# Patient Record
Sex: Female | Born: 1970 | Race: Black or African American | Hispanic: No | Marital: Single | Smoking: Former smoker
Health system: Southern US, Community
[De-identification: ages and names within clinical notes are randomized; demographics above are authoritative.]

## PROBLEM LIST (undated history)

## (undated) DIAGNOSIS — Z973 Presence of spectacles and contact lenses: Secondary | ICD-10-CM

## (undated) DIAGNOSIS — D649 Anemia, unspecified: Secondary | ICD-10-CM

## (undated) DIAGNOSIS — J189 Pneumonia, unspecified organism: Secondary | ICD-10-CM

## (undated) DIAGNOSIS — I2510 Atherosclerotic heart disease of native coronary artery without angina pectoris: Secondary | ICD-10-CM

## (undated) DIAGNOSIS — C50511 Malignant neoplasm of lower-outer quadrant of right female breast: Secondary | ICD-10-CM

## (undated) DIAGNOSIS — R51 Headache: Secondary | ICD-10-CM

## (undated) DIAGNOSIS — I10 Essential (primary) hypertension: Secondary | ICD-10-CM

## (undated) DIAGNOSIS — Z1379 Encounter for other screening for genetic and chromosomal anomalies: Principal | ICD-10-CM

## (undated) DIAGNOSIS — F419 Anxiety disorder, unspecified: Secondary | ICD-10-CM

## (undated) DIAGNOSIS — C50911 Malignant neoplasm of unspecified site of right female breast: Secondary | ICD-10-CM

## (undated) DIAGNOSIS — E785 Hyperlipidemia, unspecified: Secondary | ICD-10-CM

## (undated) DIAGNOSIS — I421 Obstructive hypertrophic cardiomyopathy: Secondary | ICD-10-CM

## (undated) DIAGNOSIS — N182 Chronic kidney disease, stage 2 (mild): Secondary | ICD-10-CM

## (undated) DIAGNOSIS — I499 Cardiac arrhythmia, unspecified: Secondary | ICD-10-CM

## (undated) DIAGNOSIS — Z170 Estrogen receptor positive status [ER+]: Secondary | ICD-10-CM

## (undated) DIAGNOSIS — Z923 Personal history of irradiation: Secondary | ICD-10-CM

## (undated) DIAGNOSIS — I219 Acute myocardial infarction, unspecified: Secondary | ICD-10-CM

## (undated) DIAGNOSIS — F329 Major depressive disorder, single episode, unspecified: Secondary | ICD-10-CM

## (undated) DIAGNOSIS — Z9189 Other specified personal risk factors, not elsewhere classified: Secondary | ICD-10-CM

## (undated) DIAGNOSIS — G373 Acute transverse myelitis in demyelinating disease of central nervous system: Secondary | ICD-10-CM

## (undated) DIAGNOSIS — F319 Bipolar disorder, unspecified: Secondary | ICD-10-CM

## (undated) DIAGNOSIS — Z955 Presence of coronary angioplasty implant and graft: Secondary | ICD-10-CM

## (undated) DIAGNOSIS — R519 Headache, unspecified: Secondary | ICD-10-CM

## (undated) DIAGNOSIS — R002 Palpitations: Secondary | ICD-10-CM

## (undated) DIAGNOSIS — F3170 Bipolar disorder, currently in remission, most recent episode unspecified: Secondary | ICD-10-CM

## (undated) DIAGNOSIS — K219 Gastro-esophageal reflux disease without esophagitis: Secondary | ICD-10-CM

## (undated) DIAGNOSIS — I252 Old myocardial infarction: Secondary | ICD-10-CM

## (undated) DIAGNOSIS — G43909 Migraine, unspecified, not intractable, without status migrainosus: Secondary | ICD-10-CM

## (undated) DIAGNOSIS — F32A Depression, unspecified: Secondary | ICD-10-CM

## (undated) DIAGNOSIS — N189 Chronic kidney disease, unspecified: Secondary | ICD-10-CM

## (undated) DIAGNOSIS — N183 Chronic kidney disease, stage 3 unspecified: Secondary | ICD-10-CM

## (undated) HISTORY — DX: Other specified personal risk factors, not elsewhere classified: Z91.89

## (undated) HISTORY — PX: UMBILICAL HERNIA REPAIR: SHX196

## (undated) HISTORY — PX: HERNIA REPAIR: SHX51

## (undated) HISTORY — DX: Chronic kidney disease, unspecified: N18.9

## (undated) HISTORY — PX: ABDOMINAL HERNIA REPAIR: SHX539

## (undated) HISTORY — DX: Atherosclerotic heart disease of native coronary artery without angina pectoris: I25.10

## (undated) HISTORY — DX: Essential (primary) hypertension: I10

## (undated) HISTORY — DX: Palpitations: R00.2

## (undated) HISTORY — PX: CORONARY ANGIOPLASTY WITH STENT PLACEMENT: SHX49

## (undated) HISTORY — DX: Acute myocardial infarction, unspecified: I21.9

## (undated) HISTORY — DX: Hyperlipidemia, unspecified: E78.5

## (undated) HISTORY — DX: Encounter for other screening for genetic and chromosomal anomalies: Z13.79

## (undated) HISTORY — DX: Depression, unspecified: F32.A

## (undated) HISTORY — DX: Anxiety disorder, unspecified: F41.9

## (undated) HISTORY — DX: Acute transverse myelitis in demyelinating disease of central nervous system: G37.3

## (undated) HISTORY — DX: Obstructive hypertrophic cardiomyopathy: I42.1

## (undated) HISTORY — DX: Major depressive disorder, single episode, unspecified: F32.9

## (undated) HISTORY — PX: BREAST BIOPSY: SHX20

---

## 2003-10-10 HISTORY — PX: TONSILLECTOMY: SUR1361

## 2014-09-07 ENCOUNTER — Ambulatory Visit (INDEPENDENT_AMBULATORY_CARE_PROVIDER_SITE_OTHER): Payer: Federal, State, Local not specified - PPO | Admitting: Family Medicine

## 2014-09-07 VITALS — BP 180/100 | HR 90 | Temp 98.0°F | Resp 16 | Ht 61.5 in | Wt 146.8 lb

## 2014-09-07 DIAGNOSIS — Z131 Encounter for screening for diabetes mellitus: Secondary | ICD-10-CM

## 2014-09-07 DIAGNOSIS — I10 Essential (primary) hypertension: Secondary | ICD-10-CM

## 2014-09-07 DIAGNOSIS — N898 Other specified noninflammatory disorders of vagina: Secondary | ICD-10-CM

## 2014-09-07 DIAGNOSIS — F17200 Nicotine dependence, unspecified, uncomplicated: Secondary | ICD-10-CM

## 2014-09-07 DIAGNOSIS — R350 Frequency of micturition: Secondary | ICD-10-CM

## 2014-09-07 DIAGNOSIS — E785 Hyperlipidemia, unspecified: Secondary | ICD-10-CM

## 2014-09-07 DIAGNOSIS — N182 Chronic kidney disease, stage 2 (mild): Secondary | ICD-10-CM

## 2014-09-07 DIAGNOSIS — D72829 Elevated white blood cell count, unspecified: Secondary | ICD-10-CM

## 2014-09-07 DIAGNOSIS — R0683 Snoring: Secondary | ICD-10-CM

## 2014-09-07 DIAGNOSIS — Z130 Encounter for screening for diseases of the blood and blood-forming organs and certain disorders involving the immune mechanism: Secondary | ICD-10-CM

## 2014-09-07 LAB — POCT WET PREP WITH KOH
Clue Cells Wet Prep HPF POC: NEGATIVE
KOH Prep POC: NEGATIVE
RBC Wet Prep HPF POC: NEGATIVE
Trichomonas, UA: NEGATIVE
WBC Wet Prep HPF POC: NEGATIVE
Yeast Wet Prep HPF POC: NEGATIVE

## 2014-09-07 LAB — POCT URINALYSIS DIPSTICK
Bilirubin, UA: NEGATIVE
Blood, UA: NEGATIVE
Glucose, UA: NEGATIVE
Ketones, UA: NEGATIVE
Leukocytes, UA: NEGATIVE
Nitrite, UA: NEGATIVE
Protein, UA: NEGATIVE
Spec Grav, UA: 1.02
Urobilinogen, UA: 0.2
pH, UA: 6.5

## 2014-09-07 LAB — COMPLETE METABOLIC PANEL WITH GFR
ALT: 12 U/L (ref 0–35)
AST: 18 U/L (ref 0–37)
Albumin: 4.2 g/dL (ref 3.5–5.2)
Alkaline Phosphatase: 63 U/L (ref 39–117)
BUN: 9 mg/dL (ref 6–23)
CO2: 22 mEq/L (ref 19–32)
Calcium: 9.5 mg/dL (ref 8.4–10.5)
Chloride: 105 mEq/L (ref 96–112)
Creat: 1.03 mg/dL (ref 0.50–1.10)
GFR, Est African American: 77 mL/min
GFR, Est Non African American: 67 mL/min
Glucose, Bld: 92 mg/dL (ref 70–99)
Potassium: 4.1 mEq/L (ref 3.5–5.3)
Sodium: 137 mEq/L (ref 135–145)
Total Bilirubin: 0.5 mg/dL (ref 0.2–1.2)
Total Protein: 7.1 g/dL (ref 6.0–8.3)

## 2014-09-07 LAB — POCT CBC
Granulocyte percent: 72.4 %G (ref 37–80)
HCT, POC: 38.3 % (ref 37.7–47.9)
Hemoglobin: 12.6 g/dL (ref 12.2–16.2)
Lymph, poc: 3.5 — AB (ref 0.6–3.4)
MCH, POC: 30.4 pg (ref 27–31.2)
MCHC: 33 g/dL (ref 31.8–35.4)
MCV: 91.9 fL (ref 80–97)
MID (cbc): 0.5 (ref 0–0.9)
MPV: 7.2 fL (ref 0–99.8)
POC Granulocyte: 10.5 — AB (ref 2–6.9)
POC LYMPH PERCENT: 24.1 %L (ref 10–50)
POC MID %: 3.5 %M (ref 0–12)
Platelet Count, POC: 351 10*3/uL (ref 142–424)
RBC: 4.17 M/uL (ref 4.04–5.48)
RDW, POC: 15 %
WBC: 14.5 10*3/uL — AB (ref 4.6–10.2)

## 2014-09-07 LAB — LIPID PANEL
Cholesterol: 260 mg/dL — ABNORMAL HIGH (ref 0–200)
HDL: 40 mg/dL (ref >39)
LDL Cholesterol: 199 mg/dL — ABNORMAL HIGH (ref 0–99)
Total CHOL/HDL Ratio: 6.5 Ratio
Triglycerides: 105 mg/dL (ref <150)
VLDL: 21 mg/dL (ref 0–40)

## 2014-09-07 LAB — TSH: TSH: 0.896 u[IU]/mL (ref 0.350–4.500)

## 2014-09-07 LAB — POCT UA - MICROSCOPIC ONLY
Bacteria, U Microscopic: NEGATIVE
Casts, Ur, LPF, POC: NEGATIVE
Crystals, Ur, HPF, POC: NEGATIVE
Mucus, UA: NEGATIVE
RBC, urine, microscopic: NEGATIVE
WBC, Ur, HPF, POC: NEGATIVE
Yeast, UA: NEGATIVE

## 2014-09-07 LAB — POCT GLYCOSYLATED HEMOGLOBIN (HGB A1C): Hemoglobin A1C: 5.2

## 2014-09-07 MED ORDER — TRIAMTERENE-HCTZ 37.5-25 MG PO CAPS: 1.0000 | ORAL_CAPSULE | Freq: Every day | ORAL | Status: DC

## 2014-09-07 MED ORDER — HYDRALAZINE HCL 25 MG PO TABS: 25.0000 mg | ORAL_TABLET | Freq: Three times a day (TID) | ORAL | Status: DC

## 2014-09-07 MED ORDER — DILTIAZEM HCL ER 240 MG PO CP24: 240.0000 mg | ORAL_CAPSULE | Freq: Every day | ORAL | Status: DC

## 2014-09-07 NOTE — Progress Notes (Signed)
Chief Complaint:  Chief Complaint  Patient presents with  . Medication Refill    all on list     HPI: Anna Henson is a 43 y.o. female who is here for :  1. HTN which is poorly controlled, she moved here from [facility] , 2 months ago. She works for the [employer] in [employer].  She has family here. She has been out of her HTN meds for several months except for the hydralazine. She is on 4 blood pressure medicinea for the last 5 years maybe even 10 years. She is a smoker 1/2 ppd x 25 years. Does not drink regular, just a glass of wine.  BP 183/105 this morning. She is havign a HA but no CP, diaphoresis, no palpitations, no SOB. NO vision changes. Denies DM, Has hyperlipidemia.   2. She  Also states that she snores, she feels tired all the time, she is tired even with 8 hours of sleep, she has had depression in the past ( zoloft 1/2 tab daily), seh doe snot take naps but would do it if she could at work. No pedal edema and no abnormal weight gain. She has some right hand swelling.    Past Medical History  Diagnosis Date  . Hypertension     > 5 years  . Chronic kidney disease     she sthinks has stage 2 CKD, has had US doppler and has some stenosis , this was done in [place]   . Hyperlipidemia    Past Surgical History  Procedure Laterality Date  . Hernia repair     History   Social History  . Marital Status: Single    Spouse Name: N/A    Number of Children: N/A  . Years of Education: N/A   Social History Main Topics  . Smoking status: Current Every Day Smoker -- 0.50 packs/day    Types: Cigarettes  . Smokeless tobacco: None  . Alcohol Use: None  . Drug Use: None  . Sexual Activity: None   Other Topics Concern  . None   Social History Narrative   Family History  Problem Relation Age of Onset  . Diabetes Mother   . Heart disease Mother   . Hyperlipidemia Mother   . Hypertension Mother    No Known Allergies Prior to Admission medications   Medication Sig  Start Date End Date Taking? Authorizing Provider  diltiazem (DILACOR XR) 240 MG 24 hr capsule Take 240 mg by mouth daily.   Yes Historical Provider, MD  hydrALAZINE (APRESOLINE) 25 MG tablet Take 25 mg by mouth 3 (three) times daily.   Yes Historical Provider, MD  triamterene-hydrochlorothiazide (DYAZIDE) 37.5-25 MG per capsule Take 1 capsule by mouth daily.   Yes Historical Provider, MD     ROS: The patient denies fevers, chills, night sweats, unintentional weight loss, chest pain, palpitations, wheezing, dyspnea on exertion, nausea, vomiting, abdominal pain, dysuria, hematuria, melena, numbness, weakness, or tingling.   All other systems have been reviewed and were otherwise negative with the exception of those mentioned in the HPI and as above.    PHYSICAL EXAM: Filed Vitals:   09/07/14 1135  BP: 180/100  Pulse:   Temp:   Resp:    Filed Vitals:   09/07/14 1107  Height: 5' 1.5" (1.562 m)  Weight: 146 lb 12.8 oz (66.588 kg)   Body mass index is 27.29 kg/(m^2).  General: Alert, no acute distress HEENT:  Normocephalic, atraumatic, oropharynx patent. EOMI,  PERRLA, fundo exam normal, TM nl Cardiovascular:  Regular rate and rhythm, no rubs murmurs or gallops.  No Carotid bruits, radial pulse intact. No pedal edema.  Respiratory: Clear to auscultation bilaterally.  No wheezes, rales, or rhonchi.  No cyanosis, no use of accessory musculature GI: No organomegaly, abdomen is soft and non-tender, positive bowel sounds.  No masses. Skin: No rashes. Neurologic: Facial musculature symmetric. CN 2-12 grossly normal Psychiatric: Patient is appropriate throughout our interaction. Lymphatic: No cervical lymphadenopathy Musculoskeletal: Gait intact. Neg for meningeal signs She is a smoker, has chronic smokers cough NO URI sxs at thsi time except for dry cough    LABS: Results for orders placed or performed in visit on 09/07/14  COMPLETE METABOLIC PANEL WITH GFR  Result Value Ref Range    Sodium 137 135 - 145 mEq/L   Potassium 4.1 3.5 - 5.3 mEq/L   Chloride 105 96 - 112 mEq/L   CO2 22 19 - 32 mEq/L   Glucose, Bld 92 70 - 99 mg/dL   BUN 9 6 - 23 mg/dL   Creat 1.03 0.50 - 1.10 mg/dL   Total Bilirubin 0.5 0.2 - 1.2 mg/dL   Alkaline Phosphatase 63 39 - 117 U/L   AST 18 0 - 37 U/L   ALT 12 0 - 35 U/L   Total Protein 7.1 6.0 - 8.3 g/dL   Albumin 4.2 3.5 - 5.2 g/dL   Calcium 9.5 8.4 - 10.5 mg/dL   GFR, Est African American 77 mL/min   GFR, Est Non African American 67 mL/min  TSH  Result Value Ref Range   TSH 0.896 0.350 - 4.500 uIU/mL  Lipid panel  Result Value Ref Range   Cholesterol 260 (H) 0 - 200 mg/dL   Triglycerides 105 <150 mg/dL   HDL 40 >39 mg/dL   Total CHOL/HDL Ratio 6.5 Ratio   VLDL 21 0 - 40 mg/dL   LDL Cholesterol 199 (H) 0 - 99 mg/dL  Microalbumin, urine  Result Value Ref Range   Microalb, Ur 0.8 <2.0 mg/dL  POCT CBC  Result Value Ref Range   WBC 14.5 (A) 4.6 - 10.2 K/uL   Lymph, poc 3.5 (A) 0.6 - 3.4   POC LYMPH PERCENT 24.1 10 - 50 %L   MID (cbc) 0.5 0 - 0.9   POC MID % 3.5 0 - 12 %M   POC Granulocyte 10.5 (A) 2 - 6.9   Granulocyte percent 72.4 37 - 80 %G   RBC 4.17 4.04 - 5.48 M/uL   Hemoglobin 12.6 12.2 - 16.2 g/dL   HCT, POC 38.3 37.7 - 47.9 %   MCV 91.9 80 - 97 fL   MCH, POC 30.4 27 - 31.2 pg   MCHC 33.0 31.8 - 35.4 g/dL   RDW, POC 15.0 %   Platelet Count, POC 351 142 - 424 K/uL   MPV 7.2 0 - 99.8 fL  POCT glycosylated hemoglobin (Hb A1C)  Result Value Ref Range   Hemoglobin A1C 5.2   POCT UA - Microscopic Only  Result Value Ref Range   WBC, Ur, HPF, POC neg    RBC, urine, microscopic neg    Bacteria, U Microscopic neg    Mucus, UA neg    Epithelial cells, urine per micros 3-6    Crystals, Ur, HPF, POC neg    Casts, Ur, LPF, POC neg    Yeast, UA neg   POCT urinalysis dipstick  Result Value Ref Range   Color, UA yellow  Clarity, UA clear    Glucose, UA neg    Bilirubin, UA neg    Ketones, UA neg    Spec Grav, UA 1.020     Blood, UA neg    pH, UA 6.5    Protein, UA neg    Urobilinogen, UA 0.2    Nitrite, UA neg    Leukocytes, UA Negative   POCT Wet Prep with KOH  Result Value Ref Range   Trichomonas, UA Negative    Clue Cells Wet Prep HPF POC neg    Epithelial Wet Prep HPF POC 6-20    Yeast Wet Prep HPF POC neg    Bacteria Wet Prep HPF POC 1+    RBC Wet Prep HPF POC neg    WBC Wet Prep HPF POC neg    KOH Prep POC Negative      EKG/XRAY:   Primary read interpreted by Dr. Marin Comment at [facility].   ASSESSMENT/PLAN: Encounter Diagnoses  Name Primary?  . HTN (hypertension), malignant Yes  . Hyperlipidemia   . Screening for diabetes mellitus   . Screening for deficiency anemia   . Snoring   . Tobacco dependence   . CKD (chronic kidney disease), stage 2 (mild)   . Increased urinary frequency   . Vaginal discharge   . Leukocytosis    Declined Flu ? Leukocytosis-cont to monitor , neg for meningeal signs Labs pending Refilled meds Advise to call back in 48 hrs with BP logs Refer to sleep study rule out OSA  Gross sideeffects, risk and benefits, and alternatives of medications d/w patient. Patient is aware that all medications have potential sideeffects and we are unable to predict every sideeffect or drug-drug interaction that may occur.  Leotis Pain, DO 09/09/2014 2:30 PM   Called her today to check up on her, she was not feelign better so saw Dr Linna Darner and was given amox for possibel ear infection, will recheck her BP in 1 week.  We discussed labs and she would like to not try any medicines at this time even though we went through risk factors and stratification for MI/CVA. She would liek to aggressively change her lifestyle for next 6 months and return

## 2014-09-08 ENCOUNTER — Telehealth: Payer: Self-pay

## 2014-09-08 DIAGNOSIS — I10 Essential (primary) hypertension: Secondary | ICD-10-CM

## 2014-09-08 LAB — MICROALBUMIN, URINE: Microalb, Ur: 0.8 mg/dL (ref <2.0)

## 2014-09-08 MED ORDER — HYDRALAZINE HCL 25 MG PO TABS: 25.0000 mg | ORAL_TABLET | Freq: Three times a day (TID) | ORAL | Status: DC

## 2014-09-08 NOTE — Telephone Encounter (Signed)
Pt states she was given  30 pills for her APRESOLINE 25 MGS, even though she have refills, will be out since she takes 3 a day. Please call Nelson

## 2014-09-08 NOTE — Telephone Encounter (Signed)
Called pharmacy to change qty to 90. Pt is not going to be able to  Called pt to advise- remind that pt is to RTC on 12/2 with BP readings per Dr. Marin Comment.

## 2014-09-09 ENCOUNTER — Telehealth: Payer: Self-pay

## 2014-09-09 ENCOUNTER — Ambulatory Visit (INDEPENDENT_AMBULATORY_CARE_PROVIDER_SITE_OTHER): Payer: Federal, State, Local not specified - PPO | Admitting: Family Medicine

## 2014-09-09 VITALS — BP 156/84 | HR 92 | Temp 98.1°F | Resp 18 | Ht 61.5 in | Wt 146.0 lb

## 2014-09-09 DIAGNOSIS — H9201 Otalgia, right ear: Secondary | ICD-10-CM

## 2014-09-09 DIAGNOSIS — D72829 Elevated white blood cell count, unspecified: Secondary | ICD-10-CM

## 2014-09-09 DIAGNOSIS — H6691 Otitis media, unspecified, right ear: Secondary | ICD-10-CM

## 2014-09-09 DIAGNOSIS — R5381 Other malaise: Secondary | ICD-10-CM

## 2014-09-09 DIAGNOSIS — I10 Essential (primary) hypertension: Secondary | ICD-10-CM | POA: Insufficient documentation

## 2014-09-09 LAB — POCT CBC
Granulocyte percent: 75 %G (ref 37–80)
HCT, POC: 46 % (ref 37.7–47.9)
Hemoglobin: 15.1 g/dL (ref 12.2–16.2)
Lymph, poc: 3.4 (ref 0.6–3.4)
MCH, POC: 30.5 pg (ref 27–31.2)
MCHC: 32.9 g/dL (ref 31.8–35.4)
MCV: 93 fL (ref 80–97)
MID (cbc): 0.3 (ref 0–0.9)
MPV: 7.5 fL (ref 0–99.8)
POC Granulocyte: 11.2 — AB (ref 2–6.9)
POC LYMPH PERCENT: 22.9 %L (ref 10–50)
POC MID %: 2.1 %M (ref 0–12)
Platelet Count, POC: 413 10*3/uL (ref 142–424)
RBC: 4.95 M/uL (ref 4.04–5.48)
RDW, POC: 14.6 %
WBC: 14.9 10*3/uL — AB (ref 4.6–10.2)

## 2014-09-09 MED ORDER — AMOXICILLIN 875 MG PO TABS: 875.0000 mg | ORAL_TABLET | Freq: Two times a day (BID) | ORAL | Status: DC

## 2014-09-09 NOTE — Telephone Encounter (Signed)
Patient called to give her blood pressure readings to Dr. Marin Comment.   Yesterday  196/103 Today  137/84  Extreme headache yesterday.  Today  diarrhea and nausea  Needs OOW note for Monday - Wednesday.  [phone]

## 2014-09-09 NOTE — Patient Instructions (Signed)
Continue the same blood pressure medications  Return in one week for a recheck  I recommend we repeat the CBC one more time on return  Stay off work through this week  Take the amoxicillin one twice daily for the ear, return sooner if problems.

## 2014-09-09 NOTE — Progress Notes (Signed)
Subjective:  43 year old lady who was treated here a few days ago for her high blood pressure. She has been just feeling lousy, rundown, tired. She has been taking her medications. Her blood pressures have fluctuated at home, with a low of about 100/60 and a high of in the 160s. No major headaches or dizziness or chest pain type symptoms, just feels bad. Her right ear is been hurting her. Her white blood count was high when she was here the other day, etiology unknown. She has not worked this week. She lives with her mother. She moved down here from up [place] somewhere.  Objective: Her TMs are normal on the left. Slightly red on the right, especially along the umbo and surrounding it. The drum itself is just mildly reddened than the left. Her throat was clear. Neck supple. Chest clear. Heart regular without murmurs gallops or arrhythmias. Abdomen soft and nontender. Extremities without edema. Patient is fully alert and oriented.  Assessment: Hypertension, improving control Generalized malaise Right otalgia and right otitis Hypercholesterolemia  Plan: Amoxicillin for the ear Continue same blood pressure medicines We'll probably need to be treated for cholesterol, but lets wait and get her feeling better with the blood pressure medicines first. I think her body is just adapting to them. She will continue to monitor her blood pressure readings at home. Return in one week.     Results for orders placed or performed in visit on 09/09/14  POCT CBC  Result Value Ref Range   WBC 14.9 (A) 4.6 - 10.2 K/uL   Lymph, poc 3.4 0.6 - 3.4   POC LYMPH PERCENT 22.9 10 - 50 %L   MID (cbc) 0.3 0 - 0.9   POC MID % 2.1 0 - 12 %M   POC Granulocyte 11.2 (A) 2 - 6.9   Granulocyte percent 75.0 37 - 80 %G   RBC 4.95 4.04 - 5.48 M/uL   Hemoglobin 15.1 12.2 - 16.2 g/dL   HCT, POC 46.0 37.7 - 47.9 %   MCV 93.0 80 - 97 fL   MCH, POC 30.5 27 - 31.2 pg   MCHC 32.9 31.8 - 35.4 g/dL   RDW, POC 14.6 %   Platelet  Count, POC 413 142 - 424 K/uL   MPV 7.5 0 - 99.8 fL

## 2014-09-10 NOTE — Telephone Encounter (Signed)
Dr. Marin Comment see below for BP readings. Pt was evaluated by Dr. Linna Darner on 12/2

## 2014-09-13 ENCOUNTER — Encounter (HOSPITAL_COMMUNITY): Payer: Self-pay | Admitting: *Deleted

## 2014-09-13 ENCOUNTER — Inpatient Hospital Stay (HOSPITAL_COMMUNITY): Payer: Federal, State, Local not specified - PPO

## 2014-09-13 ENCOUNTER — Inpatient Hospital Stay (HOSPITAL_COMMUNITY)
Admission: EM | Admit: 2014-09-13 | Discharge: 2014-09-16 | DRG: 247 | Disposition: A | Discharge status: Home / Self Care | Payer: Federal, State, Local not specified - PPO | Attending: Family Medicine | Admitting: Family Medicine

## 2014-09-13 ENCOUNTER — Emergency Department (HOSPITAL_COMMUNITY): Payer: Federal, State, Local not specified - PPO

## 2014-09-13 DIAGNOSIS — Z720 Tobacco use: Secondary | ICD-10-CM | POA: Insufficient documentation

## 2014-09-13 DIAGNOSIS — I059 Rheumatic mitral valve disease, unspecified: Secondary | ICD-10-CM

## 2014-09-13 DIAGNOSIS — Z79899 Other long term (current) drug therapy: Secondary | ICD-10-CM

## 2014-09-13 DIAGNOSIS — B373 Candidiasis of vulva and vagina: Secondary | ICD-10-CM | POA: Diagnosis not present

## 2014-09-13 DIAGNOSIS — R079 Chest pain, unspecified: Secondary | ICD-10-CM

## 2014-09-13 DIAGNOSIS — B3749 Other urogenital candidiasis: Secondary | ICD-10-CM | POA: Diagnosis present

## 2014-09-13 DIAGNOSIS — I10 Essential (primary) hypertension: Secondary | ICD-10-CM | POA: Diagnosis present

## 2014-09-13 DIAGNOSIS — I129 Hypertensive chronic kidney disease with stage 1 through stage 4 chronic kidney disease, or unspecified chronic kidney disease: Secondary | ICD-10-CM | POA: Diagnosis present

## 2014-09-13 DIAGNOSIS — R51 Headache: Secondary | ICD-10-CM | POA: Diagnosis not present

## 2014-09-13 DIAGNOSIS — E785 Hyperlipidemia, unspecified: Secondary | ICD-10-CM | POA: Diagnosis present

## 2014-09-13 DIAGNOSIS — H6690 Otitis media, unspecified, unspecified ear: Secondary | ICD-10-CM | POA: Diagnosis present

## 2014-09-13 DIAGNOSIS — I25110 Atherosclerotic heart disease of native coronary artery with unstable angina pectoris: Secondary | ICD-10-CM | POA: Diagnosis present

## 2014-09-13 DIAGNOSIS — N179 Acute kidney failure, unspecified: Secondary | ICD-10-CM | POA: Diagnosis present

## 2014-09-13 DIAGNOSIS — Z8249 Family history of ischemic heart disease and other diseases of the circulatory system: Secondary | ICD-10-CM | POA: Diagnosis not present

## 2014-09-13 DIAGNOSIS — N390 Urinary tract infection, site not specified: Secondary | ICD-10-CM | POA: Diagnosis present

## 2014-09-13 DIAGNOSIS — F17210 Nicotine dependence, cigarettes, uncomplicated: Secondary | ICD-10-CM | POA: Diagnosis present

## 2014-09-13 DIAGNOSIS — I200 Unstable angina: Secondary | ICD-10-CM | POA: Diagnosis present

## 2014-09-13 DIAGNOSIS — I2510 Atherosclerotic heart disease of native coronary artery without angina pectoris: Secondary | ICD-10-CM | POA: Diagnosis present

## 2014-09-13 DIAGNOSIS — N182 Chronic kidney disease, stage 2 (mild): Secondary | ICD-10-CM | POA: Diagnosis present

## 2014-09-13 DIAGNOSIS — I249 Acute ischemic heart disease, unspecified: Secondary | ICD-10-CM | POA: Insufficient documentation

## 2014-09-13 DIAGNOSIS — R519 Headache, unspecified: Secondary | ICD-10-CM | POA: Clinically undetermined

## 2014-09-13 DIAGNOSIS — R071 Chest pain on breathing: Secondary | ICD-10-CM

## 2014-09-13 LAB — I-STAT CHEM 8, ED
BUN: 18 mg/dL (ref 6–23)
Calcium, Ion: 1.23 mmol/L (ref 1.12–1.23)
Chloride: 102 mEq/L (ref 96–112)
Creatinine, Ser: 1.2 mg/dL — ABNORMAL HIGH (ref 0.50–1.10)
Glucose, Bld: 97 mg/dL (ref 70–99)
HCT: 43 % (ref 36.0–46.0)
Hemoglobin: 14.6 g/dL (ref 12.0–15.0)
Potassium: 4.1 mEq/L (ref 3.7–5.3)
Sodium: 137 mEq/L (ref 137–147)
TCO2: 21 mmol/L (ref 0–100)

## 2014-09-13 LAB — I-STAT TROPONIN, ED: Troponin i, poc: 0.01 ng/mL (ref 0.00–0.08)

## 2014-09-13 LAB — CBC WITH DIFFERENTIAL/PLATELET
Basophils Absolute: 0.1 10*3/uL (ref 0.0–0.1)
Basophils Relative: 1 % (ref 0–1)
Eosinophils Absolute: 0.4 10*3/uL (ref 0.0–0.7)
Eosinophils Relative: 4 % (ref 0–5)
HCT: 35.3 % — ABNORMAL LOW (ref 36.0–46.0)
Hemoglobin: 12.6 g/dL (ref 12.0–15.0)
Lymphocytes Relative: 34 % (ref 12–46)
Lymphs Abs: 3.4 10*3/uL (ref 0.7–4.0)
MCH: 31.7 pg (ref 26.0–34.0)
MCHC: 35.7 g/dL (ref 30.0–36.0)
MCV: 88.9 fL (ref 78.0–100.0)
Monocytes Absolute: 0.5 10*3/uL (ref 0.1–1.0)
Monocytes Relative: 5 % (ref 3–12)
Neutro Abs: 5.8 10*3/uL (ref 1.7–7.7)
Neutrophils Relative %: 56 % (ref 43–77)
Platelets: 300 10*3/uL (ref 150–400)
RBC: 3.97 MIL/uL (ref 3.87–5.11)
RDW: 13.9 % (ref 11.5–15.5)
WBC: 10.2 10*3/uL (ref 4.0–10.5)

## 2014-09-13 LAB — COMPREHENSIVE METABOLIC PANEL
ALT: 16 U/L (ref 0–35)
AST: 19 U/L (ref 0–37)
Albumin: 3.6 g/dL (ref 3.5–5.2)
Alkaline Phosphatase: 56 U/L (ref 39–117)
Anion gap: 13 (ref 5–15)
BUN: 14 mg/dL (ref 6–23)
CO2: 20 mEq/L (ref 19–32)
Calcium: 9 mg/dL (ref 8.4–10.5)
Chloride: 102 mEq/L (ref 96–112)
Creatinine, Ser: 1.03 mg/dL (ref 0.50–1.10)
GFR calc Af Amer: 77 mL/min — ABNORMAL LOW (ref >90)
GFR calc non Af Amer: 66 mL/min — ABNORMAL LOW (ref >90)
Glucose, Bld: 87 mg/dL (ref 70–99)
Potassium: 4.2 mEq/L (ref 3.7–5.3)
Sodium: 135 mEq/L — ABNORMAL LOW (ref 137–147)
Total Bilirubin: 0.4 mg/dL (ref 0.3–1.2)
Total Protein: 6.8 g/dL (ref 6.0–8.3)

## 2014-09-13 LAB — HCG, QUANTITATIVE, PREGNANCY: hCG, Beta Chain, Quant, S: <1 m[IU]/mL (ref <5)

## 2014-09-13 LAB — RAPID URINE DRUG SCREEN, HOSP PERFORMED
Amphetamines: NOT DETECTED
Barbiturates: NOT DETECTED
Benzodiazepines: NOT DETECTED
Cocaine: NOT DETECTED
Opiates: NOT DETECTED
Tetrahydrocannabinol: POSITIVE — AB

## 2014-09-13 LAB — TROPONIN I
Troponin I: <0.3 ng/mL (ref <0.30)
Troponin I: <0.3 ng/mL (ref <0.30)
Troponin I: <0.3 ng/mL (ref <0.30)

## 2014-09-13 LAB — URINALYSIS, ROUTINE W REFLEX MICROSCOPIC
Bilirubin Urine: NEGATIVE
Glucose, UA: NEGATIVE mg/dL
Hgb urine dipstick: NEGATIVE
Ketones, ur: NEGATIVE mg/dL
Nitrite: NEGATIVE
Protein, ur: NEGATIVE mg/dL
Specific Gravity, Urine: >1.046 — ABNORMAL HIGH (ref 1.005–1.030)
Urobilinogen, UA: 1 mg/dL (ref 0.0–1.0)
pH: 6 (ref 5.0–8.0)

## 2014-09-13 LAB — PROTIME-INR
INR: 1.07 (ref 0.00–1.49)
Prothrombin Time: 14 seconds (ref 11.6–15.2)

## 2014-09-13 LAB — CREATININE, URINE, RANDOM: Creatinine, Urine: 166.83 mg/dL

## 2014-09-13 LAB — HEPARIN LEVEL (UNFRACTIONATED)
Heparin Unfractionated: 0.41 IU/mL (ref 0.30–0.70)
Heparin Unfractionated: 0.34 IU/mL (ref 0.30–0.70)

## 2014-09-13 LAB — URINE MICROSCOPIC-ADD ON

## 2014-09-13 LAB — MRSA PCR SCREENING: MRSA by PCR: NEGATIVE

## 2014-09-13 MED ORDER — HEPARIN (PORCINE) IN NACL 100-0.45 UNIT/ML-% IJ SOLN
900.0000 [IU]/h | INTRAMUSCULAR | Status: DC
  Administered 2014-09-13 – 2014-09-14 (×2): 900 [IU]/h via INTRAVENOUS
  Filled 2014-09-13 (×2): qty 250

## 2014-09-13 MED ORDER — DILTIAZEM HCL ER 240 MG PO CP24
240.0000 mg | ORAL_CAPSULE | Freq: Every day | ORAL | Status: DC
  Filled 2014-09-13: qty 1

## 2014-09-13 MED ORDER — SODIUM CHLORIDE 0.9 % IV SOLN: 250.0000 mL | INTRAVENOUS | Status: DC | PRN

## 2014-09-13 MED ORDER — HEPARIN SODIUM (PORCINE) 5000 UNIT/ML IJ SOLN: 5000.0000 [IU] | Freq: Three times a day (TID) | INTRAMUSCULAR | Status: DC

## 2014-09-13 MED ORDER — DILTIAZEM HCL ER 120 MG PO CP24: 120.0000 mg | ORAL_CAPSULE | Freq: Every day | ORAL | Status: DC

## 2014-09-13 MED ORDER — IOHEXOL 350 MG/ML SOLN
100.0000 mL | Freq: Once | INTRAVENOUS | Status: AC | PRN
  Administered 2014-09-13: 100 mL via INTRAVENOUS

## 2014-09-13 MED ORDER — ACETAMINOPHEN 325 MG PO TABS
650.0000 mg | ORAL_TABLET | Freq: Four times a day (QID) | ORAL | Status: DC | PRN
  Administered 2014-09-14 – 2014-09-15 (×3): 650 mg via ORAL
  Filled 2014-09-13 (×4): qty 2

## 2014-09-13 MED ORDER — ASPIRIN 325 MG PO TABS
325.0000 mg | ORAL_TABLET | Freq: Every day | ORAL | Status: DC
  Administered 2014-09-13: 325 mg via ORAL
  Filled 2014-09-13: qty 1

## 2014-09-13 MED ORDER — SODIUM CHLORIDE 0.9 % IJ SOLN
3.0000 mL | Freq: Two times a day (BID) | INTRAMUSCULAR | Status: DC
  Administered 2014-09-13 – 2014-09-15 (×3): 3 mL via INTRAVENOUS

## 2014-09-13 MED ORDER — ASPIRIN 81 MG PO CHEW
81.0000 mg | CHEWABLE_TABLET | ORAL | Status: AC
  Administered 2014-09-14: 81 mg via ORAL
  Filled 2014-09-13: qty 1

## 2014-09-13 MED ORDER — ANGIOPLASTY BOOK
Freq: Once | Status: AC
  Administered 2014-09-13: 1
  Filled 2014-09-13: qty 1

## 2014-09-13 MED ORDER — AMOXICILLIN 250 MG/5ML PO SUSR
875.0000 mg | Freq: Two times a day (BID) | ORAL | Status: DC
  Administered 2014-09-13 – 2014-09-14 (×3): 875 mg via ORAL
  Filled 2014-09-13 (×7): qty 20

## 2014-09-13 MED ORDER — ACTIVE PARTNERSHIP FOR HEALTH OF YOUR HEART BOOK
Freq: Once | Status: AC
Start: 2014-09-13 — End: 2014-09-13
  Administered 2014-09-13: 1
  Filled 2014-09-13: qty 1

## 2014-09-13 MED ORDER — HYDRALAZINE HCL 25 MG PO TABS
25.0000 mg | ORAL_TABLET | Freq: Three times a day (TID) | ORAL | Status: DC
  Administered 2014-09-13 – 2014-09-16 (×9): 25 mg via ORAL
  Filled 2014-09-13 (×11): qty 1

## 2014-09-13 MED ORDER — MORPHINE SULFATE 2 MG/ML IJ SOLN
2.0000 mg | INTRAMUSCULAR | Status: DC | PRN
  Administered 2014-09-13 – 2014-09-15 (×3): 2 mg via INTRAVENOUS
  Filled 2014-09-13 (×4): qty 1

## 2014-09-13 MED ORDER — SODIUM CHLORIDE 0.9 % IJ SOLN
3.0000 mL | Freq: Two times a day (BID) | INTRAMUSCULAR | Status: DC
  Administered 2014-09-13 – 2014-09-14 (×2): 3 mL via INTRAVENOUS

## 2014-09-13 MED ORDER — METOPROLOL TARTRATE 12.5 MG HALF TABLET
12.5000 mg | ORAL_TABLET | Freq: Two times a day (BID) | ORAL | Status: DC
  Administered 2014-09-14 (×2): 12.5 mg via ORAL
  Filled 2014-09-13 (×4): qty 1

## 2014-09-13 MED ORDER — CARVEDILOL 3.125 MG PO TABS
3.1250 mg | ORAL_TABLET | Freq: Two times a day (BID) | ORAL | Status: DC
  Filled 2014-09-13 (×3): qty 1

## 2014-09-13 MED ORDER — HEPARIN BOLUS VIA INFUSION
3000.0000 [IU] | Freq: Once | INTRAVENOUS | Status: AC
  Administered 2014-09-13: 3000 [IU] via INTRAVENOUS
  Filled 2014-09-13: qty 3000

## 2014-09-13 MED ORDER — NITROGLYCERIN IN D5W 200-5 MCG/ML-% IV SOLN
3.0000 ug/min | INTRAVENOUS | Status: DC
  Administered 2014-09-13: 3 ug/min via INTRAVENOUS
  Filled 2014-09-13: qty 250

## 2014-09-13 MED ORDER — ACETAMINOPHEN 325 MG PO TABS
ORAL_TABLET | ORAL | Status: AC
  Administered 2014-09-13: 650 mg
  Filled 2014-09-13: qty 2

## 2014-09-13 MED ORDER — ASPIRIN 81 MG PO CHEW
324.0000 mg | CHEWABLE_TABLET | Freq: Once | ORAL | Status: AC
  Administered 2014-09-13: 324 mg via ORAL
  Filled 2014-09-13: qty 4

## 2014-09-13 MED ORDER — ATORVASTATIN CALCIUM 80 MG PO TABS
80.0000 mg | ORAL_TABLET | Freq: Every day | ORAL | Status: DC
  Administered 2014-09-13 – 2014-09-15 (×3): 80 mg via ORAL
  Filled 2014-09-13 (×6): qty 1

## 2014-09-13 MED ORDER — NITROGLYCERIN 0.4 MG SL SUBL
0.4000 mg | SUBLINGUAL_TABLET | SUBLINGUAL | Status: AC | PRN
  Administered 2014-09-13 (×3): 0.4 mg via SUBLINGUAL
  Filled 2014-09-13: qty 1

## 2014-09-13 MED ORDER — SODIUM CHLORIDE 0.9 % IV SOLN
INTRAVENOUS | Status: DC
  Administered 2014-09-13 (×2): via INTRAVENOUS

## 2014-09-13 MED ORDER — SODIUM CHLORIDE 0.9 % IJ SOLN: 3.0000 mL | INTRAMUSCULAR | Status: DC | PRN

## 2014-09-13 MED ORDER — SODIUM CHLORIDE 0.9 % IV BOLUS (SEPSIS)
500.0000 mL | Freq: Once | INTRAVENOUS | Status: AC
  Administered 2014-09-13: 500 mL via INTRAVENOUS

## 2014-09-13 MED ORDER — NICOTINE 21 MG/24HR TD PT24
21.0000 mg | MEDICATED_PATCH | Freq: Every day | TRANSDERMAL | Status: DC
  Administered 2014-09-13 – 2014-09-16 (×4): 21 mg via TRANSDERMAL
  Filled 2014-09-13 (×5): qty 1

## 2014-09-13 MED ORDER — SODIUM CHLORIDE 0.9 % IV SOLN
1.0000 mL/kg/h | INTRAVENOUS | Status: DC
  Administered 2014-09-14: 1 mL/kg/h via INTRAVENOUS

## 2014-09-13 NOTE — Progress Notes (Signed)
Utilization Review Completed.Vi Whitesel T12/03/2014  

## 2014-09-13 NOTE — Consult Note (Signed)
CONSULTATION NOTE  Reason for Consult: Chest pain  Requesting Physician: Dr. Blaine Hamper  Cardiologist: None (New)  HPI: This is a 43 y.o. female with a past medical history significant for hypertension, smoking, dyslipidemia and stage II chronic kidney disease. She now presents with chest pain. She reports the pain is constant and about a 6 out of 10 in severity and radiates somewhat to her back. She was notably very hypertensive on admission. Pain does not radiate to her jaw or to her arm or is not associated with nausea. She's had significant progressive fatigue since the middle of last week. Her symptoms are deathly worse with exertion and relieved by rest. EKG is notable for sinus rhythm with lateral T-wave inversions. Initial troponin was negative. Chest x-ray showed no acute abnormalities. She was started on heparin by hospital medicine service and we were consulted for evaluation.  PMHx:  Past Medical History  Diagnosis Date  . Hypertension     > 5 years  . Chronic kidney disease     she sthinks has stage 2 CKD, has had US doppler and has some stenosis , this was done in [place]   . Hyperlipidemia    Past Surgical History  Procedure Laterality Date  . Hernia repair      FAMHx: Family History  Problem Relation Age of Onset  . Diabetes Mother   . Heart disease Mother   . Hyperlipidemia Mother   . Hypertension Mother     SOCHx:  reports that she has been smoking Cigarettes.  She has been smoking about 0.50 packs per day. She does not have any smokeless tobacco history on file. Her alcohol and drug histories are not on file.  ALLERGIES: No Known Allergies  ROS: A comprehensive review of systems was negative except for: Constitutional: positive for fatigue Respiratory: positive for dyspnea on exertion Cardiovascular: positive for chest pain  HOME MEDICATIONS: Prescriptions prior to admission  Medication Sig Dispense Refill Last Dose  . amoxicillin (AMOXIL) 875  MG tablet Take 1 tablet (875 mg total) by mouth 2 (two) times daily. 14 tablet 0 09/12/2014 at Unknown time  . diltiazem (DILACOR XR) 240 MG 24 hr capsule Take 1 capsule (240 mg total) by mouth daily. 30 capsule 5 09/12/2014 at Unknown time  . hydrALAZINE (APRESOLINE) 25 MG tablet Take 1 tablet (25 mg total) by mouth 3 (three) times daily. 90 tablet 5 09/12/2014 at Unknown time  . triamterene-hydrochlorothiazide (DYAZIDE) 37.5-25 MG per capsule Take 1 each (1 capsule total) by mouth daily. 30 capsule 5 09/12/2014 at Unknown time    HOSPITAL MEDICATIONS: Prior to Admission:  Prescriptions prior to admission  Medication Sig Dispense Refill Last Dose  . amoxicillin (AMOXIL) 875 MG tablet Take 1 tablet (875 mg total) by mouth 2 (two) times daily. 14 tablet 0 09/12/2014 at Unknown time  . diltiazem (DILACOR XR) 240 MG 24 hr capsule Take 1 capsule (240 mg total) by mouth daily. 30 capsule 5 09/12/2014 at Unknown time  . hydrALAZINE (APRESOLINE) 25 MG tablet Take 1 tablet (25 mg total) by mouth 3 (three) times daily. 90 tablet 5 09/12/2014 at Unknown time  . triamterene-hydrochlorothiazide (DYAZIDE) 37.5-25 MG per capsule Take 1 each (1 capsule total) by mouth daily. 30 capsule 5 09/12/2014 at Unknown time    VITALS: Blood pressure 105/57, pulse 64, temperature 97.9 F (36.6 C), temperature source Oral, resp. rate 12, height 5\' 1"  (1.549 m), weight 147 lb (66.679 kg), last menstrual period 08/24/2014, SpO2 100 %.  PHYSICAL EXAM: General appearance: alert and no distress Neck: no carotid bruit and no JVD Lungs: clear to auscultation bilaterally Heart: regular rate and rhythm, S1, S2 normal, no murmur, click, rub or gallop Abdomen: soft, non-tender; bowel sounds normal; no masses,  no organomegaly Extremities: extremities normal, atraumatic, no cyanosis or edema Pulses: 2+ and symmetric Skin: Skin color, texture, turgor normal. No rashes or lesions Neurologic: Grossly normal Psych:  Normal  LABS: Results for orders placed or performed during the hospital encounter of 09/13/14 (from the past 48 hour(s))  I-stat troponin, ED     Status: None   Collection Time: 09/13/14  2:43 AM  Result Value Ref Range   Troponin i, poc 0.01 0.00 - 0.08 ng/mL   Comment 3            Comment: Due to the release kinetics of cTnI, a negative result within the first hours of the onset of symptoms does not rule out myocardial infarction with certainty. If myocardial infarction is still suspected, repeat the test at appropriate intervals.   I-stat chem 8, ed     Status: Abnormal   Collection Time: 09/13/14  2:44 AM  Result Value Ref Range   Sodium 137 137 - 147 mEq/L   Potassium 4.1 3.7 - 5.3 mEq/L   Chloride 102 96 - 112 mEq/L   BUN 18 6 - 23 mg/dL   Creatinine, Ser 1.20 (H) 0.50 - 1.10 mg/dL   Glucose, Bld 97 70 - 99 mg/dL   Calcium, Ion 1.23 1.12 - 1.23 mmol/L   TCO2 21 0 - 100 mmol/L   Hemoglobin 14.6 12.0 - 15.0 g/dL   HCT 43.0 36.0 - 46.0 %  Drug screen panel, emergency     Status: Abnormal   Collection Time: 09/13/14  5:00 AM  Result Value Ref Range   Opiates NONE DETECTED NONE DETECTED   Cocaine NONE DETECTED NONE DETECTED   Benzodiazepines NONE DETECTED NONE DETECTED   Amphetamines NONE DETECTED NONE DETECTED   Tetrahydrocannabinol POSITIVE (A) NONE DETECTED   Barbiturates NONE DETECTED NONE DETECTED    Comment:        DRUG SCREEN FOR MEDICAL PURPOSES ONLY.  IF CONFIRMATION IS NEEDED FOR ANY PURPOSE, NOTIFY LAB WITHIN 5 DAYS.        LOWEST DETECTABLE LIMITS FOR URINE DRUG SCREEN Drug Class       Cutoff (ng/mL) Amphetamine      1000 Barbiturate      200 Benzodiazepine   073 Tricyclics       710 Opiates          300 Cocaine          300 THC              50     IMAGING: Dg Chest 2 View  09/13/2014   CLINICAL DATA:  Initial valuation for acute chest pain.  EXAM: CHEST  2 VIEW  COMPARISON:  None.  FINDINGS: The cardiac and mediastinal silhouettes are within  normal limits.  The lungs are normally inflated. No airspace consolidation, pleural effusion, or pulmonary edema is identified. There is no pneumothorax.  No acute osseous abnormality identified.  IMPRESSION: No active cardiopulmonary disease.   Electronically Signed   By: Jeannine Boga M.D.   On: 09/13/2014 02:30   Ct Angio Chest Aortic Dissect W &/or W/o  09/13/2014   CLINICAL DATA:  Acute onset of mid chest pain for 1 week. Intermittent tachycardia. Elevated blood pressure. Initial encounter.  EXAM: CT  ANGIOGRAPHY CHEST WITH CONTRAST  TECHNIQUE: Multidetector CT imaging of the chest was performed using the standard protocol during bolus administration of intravenous contrast. Multiplanar CT image reconstructions and MIPs were obtained to evaluate the vascular anatomy.  CONTRAST:  175mL OMNIPAQUE IOHEXOL 350 MG/ML SOLN  COMPARISON:  Chest radiograph performed earlier today at 2:03 a.m.  FINDINGS: There is no evidence of aortic dissection. No aneurysmal dilatation is seen. No calcific atherosclerotic disease is identified.  There is no evidence of pulmonary embolus.  The lungs are clear bilaterally. A small lymph node is noted along the left major fissure. There is no evidence of significant focal consolidation, pleural effusion or pneumothorax. No masses are identified; no abnormal focal contrast enhancement is seen.  The mediastinum is unremarkable in appearance. No mediastinal lymphadenopathy is seen. No pericardial effusion is identified. The great vessels are grossly unremarkable in appearance. No axillary lymphadenopathy is seen. The visualized portions of the thyroid gland are unremarkable in appearance.  The visualized portions of the liver and spleen are unremarkable. The visualized portions of the pancreas, gallbladder, stomach, adrenal glands and kidneys are within normal limits.  No acute osseous abnormalities are seen.  Review of the MIP images confirms the above findings.  IMPRESSION: 1. No  evidence of aortic dissection. No aneurysmal dilatation seen. No calcific atherosclerotic disease identified. 2. No evidence of pulmonary embolus. 3. Lungs clear bilaterally.   Electronically Signed   By: Garald Balding M.D.   On: 09/13/2014 06:15    HOSPITAL DIAGNOSES: Principal Problem:   Unstable angina Active Problems:   Essential hypertension   AKI (acute kidney injury)   HLD (hyperlipidemia)   Tobacco abuse   Otitis   IMPRESSION: 1. Unstable angina 2. Abnormal ischemic EKG 3. Tobacco abuse with recent cessation 4. Hypertension 5. Stage II chronic kidney disease secondary to poorly controlled hypertension 6. Dyslipidemia-LDL 199  RECOMMENDATION: 1. Unstable angina - Anne Fu has been describing worsening chest pressure which is fairly constant but waxes and wanes over the past week. She's had progressive discomfort with exertion which is relieved by rest as well as significant fatigue doing minimal activities. Her EKG shows new anterolateral T-wave inversions. There is a strong family history of coronary disease and she has multiple coronary risk factors including poorly controlled hypertension, dyslipidemia and tobacco abuse. Based on these findings I'm recommending cardiac catheterization. I discussed the risks and benefits with her today and she provided informed consent. She is currently on IV heparin and we will start IV nitro drip based on her persistent 3 out of 10 chest pain. 2. Accelerated hypertension- blood pressure is actually low normal. Stop diltiazem and switch to metoprolol 12.5 mg twice a day.  3. Dyslipidemia- now on high-dose Lipitor 80 mg daily. 4. CKD2- aggressive hydration overnight for planned cardiac catheterization tomorrow  Time Spent Directly with Patient: 30 minutes  Pixie Casino, MD, [facility] Attending Cardiologist [facility]  Averly Ericson C 09/13/2014, 9:35 AM

## 2014-09-13 NOTE — ED Notes (Signed)
Dr Niu at bedside 

## 2014-09-13 NOTE — Progress Notes (Addendum)
ANTICOAGULATION CONSULT NOTE - Follow Up Consult  Pharmacy Consult for Heparin Indication: chest pain/ACS  No Known Allergies  Patient Measurements: Height: 5\' 1"  (154.9 cm) Weight: 147 lb (66.679 kg) IBW/kg (Calculated) : 47.8 Heparin Dosing Weight: 60kg  Vital Signs: Temp: 99 F (37.2 C) (12/06 1311) Temp Source: Oral (12/06 1311) BP: 121/63 mmHg (12/06 1311) Pulse Rate: 83 (12/06 1311)  Labs:  Recent Labs  09/13/14 0244 09/13/14 0855 09/13/14 1358  HGB 14.6 12.6  --   HCT 43.0 35.3*  --   PLT  --  300  --   LABPROT  --  14.0  --   INR  --  1.07  --   HEPARINUNFRC  --   --  0.41  CREATININE 1.20* 1.03  --   TROPONINI  --  <0.30  --     Estimated Creatinine Clearance: 62.2 mL/min (by C-G formula based on Cr of 1.03).   Medications:  Heparin 900 units/hr  Assessment: Anna Henson on heparin for CP/ACS. Heparin level (0.41) is therapeutic - will continue current rate and check follow-up level to confirm therapeutic. Noted plans for possible cath tomorrow (12/7). - H/H and Plts wnl - No significant bleeding reported  Goal of Therapy:  Heparin level 0.3-0.7 units/ml Monitor platelets by anticoagulation protocol: Yes   Plan:  1. Continue heparin 900 units/hr (9 ml/hr) 2. Check follow-up heparin level @ 2000 to confirm therapeutic 3. Daily heparin level and CBC  [name]  [phone] 09/13/2014,2:43 PM   Addendum: Confirmatory heparin level is therapeutic at 0.34. Continue rate at 900 units/hr and follow up AM labs.  Nena Jordan, PharmD, BCPS 09/13/2014, 7:56 PM

## 2014-09-13 NOTE — ED Provider Notes (Signed)
CSN: [id number]     Arrival date & time 09/13/14  0107 History   First MD Initiated Contact with Patient 09/13/14 0146     Chief Complaint  Patient presents with  . Chest Pain      Patient is a 43 y.o. female presenting with chest pain. The history is provided by the patient.  Chest Pain Pain location:  Substernal area Pain quality: pressure   Pain radiates to:  Does not radiate Pain severity:  Moderate Onset quality:  Gradual Duration:  3 days Timing:  Constant Progression:  Unchanged Chronicity:  New Relieved by:  Nothing Worsened by:  Nothing tried Associated symptoms: fatigue, nausea and shortness of breath   Associated symptoms: no abdominal pain, no numbness, not vomiting and no weakness   Risk factors: high cholesterol, hypertension and smoking   Risk factors: no coronary artery disease   Patient reports she has had constant chest pressure and faitgue for 3 days She reports that she has had significant fatigue with any exertion for past 3 days No syncope No exertional CP  She reports recently moved here from [facility] She reports she was seen recently at urgent care to establish and start new BP meds She has been taking her meds and her BP is improved   PMH - HTN, HLP Soc hx - Smoking Family History - positive for CAD  Past Medical History  Diagnosis Date  . Hypertension     > 5 years  . Chronic kidney disease     she sthinks has stage 2 CKD, has had US doppler and has some stenosis , this was done in [place]   . Hyperlipidemia    Past Surgical History  Procedure Laterality Date  . Hernia repair     Family History  Problem Relation Age of Onset  . Diabetes Mother   . Heart disease Mother   . Hyperlipidemia Mother   . Hypertension Mother    History  Substance Use Topics  . Smoking status: Current Every Day Smoker -- 0.50 packs/day    Types: Cigarettes  . Smokeless tobacco: Not on file  . Alcohol Use: Not on file   OB History    No data  available     Review of Systems  Constitutional: Positive for fatigue.  Respiratory: Positive for shortness of breath.   Cardiovascular: Positive for chest pain.  Gastrointestinal: Positive for nausea. Negative for vomiting and abdominal pain.  Neurological: Negative for syncope, weakness and numbness.  All other systems reviewed and are negative.     Allergies  Review of patient's allergies indicates no known allergies.  Home Medications   Prior to Admission medications   Medication Sig Start Date End Date Taking? Authorizing Provider  amoxicillin (AMOXIL) 875 MG tablet Take 1 tablet (875 mg total) by mouth 2 (two) times daily. 09/09/14  Yes Posey Boyer, MD  diltiazem (DILACOR XR) 240 MG 24 hr capsule Take 1 capsule (240 mg total) by mouth daily. 09/07/14  Yes Thao P Le, DO  hydrALAZINE (APRESOLINE) 25 MG tablet Take 1 tablet (25 mg total) by mouth 3 (three) times daily. 09/08/14  Yes Thao P Le, DO  triamterene-hydrochlorothiazide (DYAZIDE) 37.5-25 MG per capsule Take 1 each (1 capsule total) by mouth daily. 09/07/14  Yes Thao P Le, DO   BP 108/62 mmHg  Pulse 68  Temp(Src) 97.9 F (36.6 C) (Oral)  Resp 16  Ht 5\' 1"  (1.549 m)  Wt 144 lb 8 oz (65.545 kg)  BMI 27.32 kg/m2  SpO2 99%  LMP 08/24/2014 Physical Exam CONSTITUTIONAL: Well developed/well nourished HEAD: Normocephalic/atraumatic EYES: EOMI/PERRL ENMT: Mucous membranes moist NECK: supple no meningeal signs SPINE/BACK:entire spine nontender CV: S1/S2 noted, no murmurs/rubs/gallops noted LUNGS: Lungs are clear to auscultation bilaterally, no apparent distress ABDOMEN: soft, nontender, no rebound or guarding, bowel sounds noted throughout abdomen GU:no cva tenderness NEURO: Pt is awake/alert/appropriate, moves all extremitiesx4.  No facial droop.   EXTREMITIES: pulses normal/equal, full ROM, no LE tenderness/edema SKIN: warm, color normal PSYCH: no abnormalities of mood noted, alert and oriented to situation  ED  Course  Procedures  Labs Review Labs Reviewed  I-STAT CHEM 8, ED - Abnormal; Notable for the following:    Creatinine, Ser 1.20 (*)    All other components within normal limits  I-STAT TROPOININ, ED    Imaging Review Dg Chest 2 View  09/13/2014   CLINICAL DATA:  Initial valuation for acute chest pain.  EXAM: CHEST  2 VIEW  COMPARISON:  None.  FINDINGS: The cardiac and mediastinal silhouettes are within normal limits.  The lungs are normally inflated. No airspace consolidation, pleural effusion, or pulmonary edema is identified. There is no pneumothorax.  No acute osseous abnormality identified.  IMPRESSION: No active cardiopulmonary disease.   Electronically Signed   By: Jeannine Boga M.D.   On: 09/13/2014 02:30     EKG Interpretation   Date/Time:  Sunday September 13 2014 01:12:02 EST Ventricular Rate:  73 PR Interval:  136 QRS Duration: 72 QT Interval:  412 QTC Calculation: 453 R Axis:   58 Text Interpretation:  Normal sinus rhythm Septal infarct , age  undetermined Abnormal ECG t wave inversion noted artifact  noted No  previous ECGs available Confirmed by Christy Gentles  MD, Elenore Rota ([id number]) on  09/13/2014 1:23:21 AM     Medications  aspirin chewable tablet 324 mg (324 mg Oral Given 09/13/14 0237)  nitroGLYCERIN (NITROSTAT) SL tablet 0.4 mg (0.4 mg Sublingual Given 09/13/14 0326)    MDM  4:41 AM Pt with persistent CP/fatigue for several days She has abnormal EKG and multiple cardiac risk factors Will admit for further monitoring D/w Dr Blaine Hamper with [facility], will admit (pt is unassigned) She had some relief with NTG while in the ED Final diagnoses:  Chest pain, rule out acute myocardial infarction    Nursing notes including past medical history and social history reviewed and considered in documentation xrays/imaging reviewed by myself and considered during evaluation Labs/vital reviewed myself and considered during evaluation Previous records reviewed and  considered     Sharyon Cable, MD 09/13/14 [phone]

## 2014-09-13 NOTE — H&P (Addendum)
[facility] History and Physical  Trenee Igoe LKT:[id number] DOB: [birth year] DOA: 09/13/2014  Referring physician: ED physician PCP: No primary care provider on file.  Specialists:   Chief Complaint: chest pain  HPI: Anna Henson is a 43 y.o. female with past medical history of hypertension, tobacco abuse, hyperlipidemia, who presents with chest pain.  Patient reports that her chest pain started since 09/09/14. It is located in the substernal area, pressure-like chest pain constant, 6 out of 10 in severity, radiating to the back. It is associated with shortness of breath and weakness. Chest pain is not pleuritic, not aggravated by deep breath. No alleviating factors. Patient does not have cough, fever or chills. No tenderness over calf areas bilaterally. She is not taking oral contraceptives. When I evaluated the patient in ED, she still has chest pain, radiating to the back between shoulder blades.  Of note, patient was supposed to take 3 blood pressure medications, but she only took one of them. One week ago, she started having shortness of breath and weakness. She visited urgent care, and was found to have significantly elevated blood pressure (systolic pressure higher than 200). She was restarted with three blood pressure medications, including Diltiazem, hydralazine and Dyazide. She has been complaining to the new medications. Because of right year pain, and a continuation of shortness of breath, she visited urgent care again. She was found to have leukocytosis and a diagnosed with right otitis. She was given prescription of amoxicillin for 7 days.   She denies fever, chills, cough, abdominal pain, diarrhea, constipation, dysuria, urgency, frequency, hematuria, skin rashes, joint pain or leg swelling.  In Ed, she was found to have T-wave inversion in lateral leads. Initial troponin is negative. Negative chest x-ray for acute abnormalities. I measured blood pressures in both arms which  are symmetric (112/66 on the right arm, versus 118/64 on left arm). Patient is admitted to inpatient for further evaluation and treatment.  Review of Systems: As presented in the history of presenting illness, rest negative.  Where does patient live?  With her mother Can patient participate in ADLs? Yes  Allergy: No Known Allergies  Past Medical History  Diagnosis Date  . Hypertension     > 5 years  . Chronic kidney disease     she sthinks has stage 2 CKD, has had US doppler and has some stenosis , this was done in [place]   . Hyperlipidemia     Past Surgical History  Procedure Laterality Date  . Hernia repair      Social History:  reports that she has been smoking Cigarettes.  She has been smoking about 0.50 packs per day. She does not have any smokeless tobacco history on file. Her alcohol and drug histories are not on file.  Family History:  Family History  Problem Relation Age of Onset  . Diabetes Mother   . Heart disease Mother   . Hyperlipidemia Mother   . Hypertension Mother      Prior to Admission medications   Medication Sig Start Date End Date Taking? Authorizing Provider  amoxicillin (AMOXIL) 875 MG tablet Take 1 tablet (875 mg total) by mouth 2 (two) times daily. 09/09/14  Yes Posey Boyer, MD  diltiazem (DILACOR XR) 240 MG 24 hr capsule Take 1 capsule (240 mg total) by mouth daily. 09/07/14  Yes Thao P Le, DO  hydrALAZINE (APRESOLINE) 25 MG tablet Take 1 tablet (25 mg total) by mouth 3 (three) times daily. 09/08/14  Yes Thao P  Le, DO  triamterene-hydrochlorothiazide (DYAZIDE) 37.5-25 MG per capsule Take 1 each (1 capsule total) by mouth daily. 09/07/14  Yes Thao Susanne Borders, DO    Physical Exam: Filed Vitals:   09/13/14 0434 09/13/14 0506 09/13/14 0508 09/13/14 0534  BP: 111/62 112/66 107/71 115/64  Pulse: 68   64  Temp:      TempSrc:      Resp: 17   17  Height:      Weight:      SpO2: 100%   100%   General: Not in acute distress HEENT:       Eyes:  PERRL, EOMI, no scleral icterus       ENT: No discharge from the ears and nose, no pharynx injection, no tonsillar enlargement.        Neck: No JVD, no bruit, no mass felt. Cardiac: S1/S2, RRR, No murmurs, No gallops or rubs Pulm: Good air movement bilaterally. Clear to auscultation bilaterally. No rales, wheezing, rhonchi or rubs. Abd: Soft, nondistended, nontender, no rebound pain, no organomegaly, BS present Ext: No edema bilaterally. 2+DP/PT pulse bilaterally Musculoskeletal: No joint deformities, erythema, or stiffness, ROM full Skin: No rashes.  Neuro: Alert and oriented X3, cranial nerves II-XII grossly intact, muscle strength 5/5 in all extremeties, sensation to light touch intact. Brachial reflex 2+ bilaterally. Knee reflex 1+ bilaterally.  Psych: Patient is not psychotic, no suicidal or hemocidal ideation.  Labs on Admission:  Basic Metabolic Panel:  Recent Labs Lab 09/07/14 1153 09/13/14 0244  NA 137 137  K 4.1 4.1  CL 105 102  CO2 22  --   GLUCOSE 92 97  BUN 9 18  CREATININE 1.03 1.20*  CALCIUM 9.5  --    Liver Function Tests:  Recent Labs Lab 09/07/14 1153  AST 18  ALT 12  ALKPHOS 63  BILITOT 0.5  PROT 7.1  ALBUMIN 4.2   No results for input(s): LIPASE, AMYLASE in the last 168 hours. No results for input(s): AMMONIA in the last 168 hours. CBC:  Recent Labs Lab 09/07/14 1158 09/09/14 1220 09/13/14 0244  WBC 14.5* 14.9*  --   HGB 12.6 15.1 14.6  HCT 38.3 46.0 43.0  MCV 91.9 93.0  --    Cardiac Enzymes: No results for input(s): CKTOTAL, CKMB, CKMBINDEX, TROPONINI in the last 168 hours.  BNP (last 3 results) No results for input(s): PROBNP in the last 8760 hours. CBG: No results for input(s): GLUCAP in the last 168 hours.  Radiological Exams on Admission: Dg Chest 2 View  09/13/2014   CLINICAL DATA:  Initial valuation for acute chest pain.  EXAM: CHEST  2 VIEW  COMPARISON:  None.  FINDINGS: The cardiac and mediastinal silhouettes are within  normal limits.  The lungs are normally inflated. No airspace consolidation, pleural effusion, or pulmonary edema is identified. There is no pneumothorax.  No acute osseous abnormality identified.  IMPRESSION: No active cardiopulmonary disease.   Electronically Signed   By: Jeannine Boga M.D.   On: 09/13/2014 02:30    EKG: Independently reviewed. T-wave inversion in lateral leads.  Assessment/Plan Principal Problem:   Chest pain Active Problems:   Essential hypertension   AKI (acute kidney injury)   HLD (hyperlipidemia)   Tobacco abuse  Chest pain: Her chest is very concerning for ACS. Patient has TWI on EKG and significant risk factors including hypertension, hyperlipidemia, tobacco abuse. Patient's chest pain is radiating to the back, plus recent significantly elevated blood pressure, making the aortic dissection be an important differential  diagnosis. Though patient does not have asymmetric blood pressure between two arms and mediastinal widening on chest x-ray, still cannot completely rule out aortic dissection. Patient to need heparin drip, but I cannot start heparin gtt until ruling out aortic dissection. Another differential diagnosis is pulmonary embolism, which is less likely given low Well's score low probability.  - will admit to sdu - cycle CE q6 x3  - one dose of ASA 324 mg was given by ED - Nitroglycerin, Morphine, and lipitor - will not start BB, since she is diltiazem, and also need to rule out cocaine abuse  - Risk factor stratification: patient just had TSH, FLP and A1C tests on 09/07/14 (LDL 199; TSH 0.896; A1c 5.2) - hold heparin gtt until CTA done.  - will start heparin gtt as soon as CTA is negative for dissection -cardiology consulted, Dr. Debara Pickett will see  HTN:  -continue Diltiazem, hydralazine - hold Dyazide given AKI and contrast use for CTA  HLD: LDL 199 on 09/07/14 -Lipitor 80 mg daily  Otitis:  -let patient complete the course of amoxicillin -check  CBC  Tobacco abuse: Half pack a day for 25 years. She states that she stopped smoking on 09/09/14. -nicotine patch  AKI: Cre is up from 1.03 on 11/30 to 1.20 on admission. Likely due to [facility] and prerenal. Contrast use may worsen her renal fx, will hydrate her. -will check FeUea -start NS 100cc/h  -hold diuretics  Addendum: CAT  negative for PE and dissection -will start Heparin gtt now. - give NS bolus 500 cc/h, then 100cc/h   DVT ppx: SQ Heparin    Code Status: Full code Family Communication:  Yes, patient's  mother     at bed side Disposition Plan: Admit to inpatient   Date of Service 09/13/2014    Ivor Costa [facility] Pager [phone]  If 7PM-7AM, please contact night-coverage [email or web address] Password [email or web address] 09/13/2014, 5:35 AM

## 2014-09-13 NOTE — ED Notes (Signed)
The pt has had mid-chest pain for one week   She has also had tachycardia intermittently.  Her bp has been high and she has been seen by her doctor for the same.  lmp 2 weeks ago

## 2014-09-13 NOTE — Progress Notes (Signed)
I have seen and assessed the patient and agree with Dr. Edgar Frisk assessment and plan. Patient's a 43 year old African-American female history of hypertension tobacco abuse hyperlipidemia, family history of coronary artery disease who presented to the ED with chest pain and shortness of breath. Patient noted to have EKG changes on presentation. Patient presented with probable unstable angina. Cardiac enzymes negative 2. 2-D echo pending. Patient has been started on IV heparin and IV nitroglycerin. Patient has been seen in consultation by cardiology and patient scheduled for cardiac catheterization tomorrow. Patient has also been started on a statin.

## 2014-09-13 NOTE — Progress Notes (Signed)
ANTICOAGULATION CONSULT NOTE - Initial Consult  Pharmacy Consult for heparin Indication: chest pain/ACS  No Known Allergies  Patient Measurements: Height: 5\' 1"  (154.9 cm) Weight: 147 lb (66.679 kg) IBW/kg (Calculated) : 47.8 Heparin Dosing Weight: 60kg  Vital Signs: Temp: 97.9 F (36.6 C) (12/06 0116) Temp Source: Oral (12/06 0116) BP: 117/67 mmHg (12/06 0600) Pulse Rate: 71 (12/06 0600)  Labs:  Recent Labs  09/13/14 0244  HGB 14.6  HCT 43.0  CREATININE 1.20*    Estimated Creatinine Clearance: 53.4 mL/min (by C-G formula based on Cr of 1.2).   Medical History: Past Medical History  Diagnosis Date  . Hypertension     > 5 years  . Chronic kidney disease     she sthinks has stage 2 CKD, has had US doppler and has some stenosis , this was done in [place]   . Hyperlipidemia     Medications:  Prescriptions prior to admission  Medication Sig Dispense Refill Last Dose  . amoxicillin (AMOXIL) 875 MG tablet Take 1 tablet (875 mg total) by mouth 2 (two) times daily. 14 tablet 0 09/12/2014 at Unknown time  . diltiazem (DILACOR XR) 240 MG 24 hr capsule Take 1 capsule (240 mg total) by mouth daily. 30 capsule 5 09/12/2014 at Unknown time  . hydrALAZINE (APRESOLINE) 25 MG tablet Take 1 tablet (25 mg total) by mouth 3 (three) times daily. 90 tablet 5 09/12/2014 at Unknown time  . triamterene-hydrochlorothiazide (DYAZIDE) 37.5-25 MG per capsule Take 1 each (1 capsule total) by mouth daily. 30 capsule 5 09/12/2014 at Unknown time   Scheduled:  . atorvastatin  80 mg Oral q1800  . sodium chloride  500 mL Intravenous Once   Infusions:  . sodium chloride 100 mL/hr at 09/13/14 7225    Assessment: 43yo female c/o CP x1wk w/ intermittent tachycardia, now radiating to back, initial troponin negative, concern for ACS, CT negative for PE and dissection, to begin heparin.  Goal of Therapy:  Heparin level 0.3-0.7 units/ml Monitor platelets by anticoagulation protocol: Yes   Plan:   Will give heparin 3000 units IV bolus x1 followed by gtt at 900 units/hr and monitor heparin levels and CBC.  Wynona Neat, PharmD, BCPS  09/13/2014,6:32 AM

## 2014-09-13 NOTE — ED Notes (Signed)
Patient transported to X-ray 

## 2014-09-13 NOTE — Progress Notes (Signed)
  Echocardiogram 2D Echocardiogram has been performed.  Donata Clay 09/13/2014, 12:31 PM

## 2014-09-14 ENCOUNTER — Encounter (HOSPITAL_COMMUNITY)
Admission: EM | Disposition: A | Discharge status: Home / Self Care | Payer: Self-pay | Source: Home / Self Care | Attending: Internal Medicine

## 2014-09-14 DIAGNOSIS — B3749 Other urogenital candidiasis: Secondary | ICD-10-CM | POA: Diagnosis present

## 2014-09-14 DIAGNOSIS — N390 Urinary tract infection, site not specified: Secondary | ICD-10-CM | POA: Diagnosis present

## 2014-09-14 DIAGNOSIS — N3000 Acute cystitis without hematuria: Secondary | ICD-10-CM

## 2014-09-14 DIAGNOSIS — H6690 Otitis media, unspecified, unspecified ear: Secondary | ICD-10-CM

## 2014-09-14 DIAGNOSIS — I25110 Atherosclerotic heart disease of native coronary artery with unstable angina pectoris: Secondary | ICD-10-CM

## 2014-09-14 HISTORY — PX: LEFT HEART CATHETERIZATION WITH CORONARY ANGIOGRAM: SHX5451

## 2014-09-14 HISTORY — PX: PERCUTANEOUS CORONARY STENT INTERVENTION (PCI-S): SHX5485

## 2014-09-14 LAB — COMPREHENSIVE METABOLIC PANEL
ALT: 11 U/L (ref 0–35)
AST: 16 U/L (ref 0–37)
Albumin: 3 g/dL — ABNORMAL LOW (ref 3.5–5.2)
Alkaline Phosphatase: 48 U/L (ref 39–117)
Anion gap: 16 — ABNORMAL HIGH (ref 5–15)
BUN: 14 mg/dL (ref 6–23)
CO2: 17 mEq/L — ABNORMAL LOW (ref 19–32)
Calcium: 8.7 mg/dL (ref 8.4–10.5)
Chloride: 107 mEq/L (ref 96–112)
Creatinine, Ser: 0.98 mg/dL (ref 0.50–1.10)
GFR calc Af Amer: 81 mL/min — ABNORMAL LOW (ref >90)
GFR calc non Af Amer: 70 mL/min — ABNORMAL LOW (ref >90)
Glucose, Bld: 98 mg/dL (ref 70–99)
Potassium: 4.5 mEq/L (ref 3.7–5.3)
Sodium: 140 mEq/L (ref 137–147)
Total Bilirubin: 0.3 mg/dL (ref 0.3–1.2)
Total Protein: 6 g/dL (ref 6.0–8.3)

## 2014-09-14 LAB — UREA NITROGEN, URINE: Urea Nitrogen, Ur: 967 mg/dL

## 2014-09-14 LAB — HEPARIN LEVEL (UNFRACTIONATED): Heparin Unfractionated: 0.4 IU/mL (ref 0.30–0.70)

## 2014-09-14 LAB — POCT ACTIVATED CLOTTING TIME: Activated Clotting Time: 288 seconds

## 2014-09-14 LAB — PREGNANCY, URINE: Preg Test, Ur: NEGATIVE

## 2014-09-14 SURGERY — LEFT HEART CATHETERIZATION WITH CORONARY ANGIOGRAM
Anesthesia: LOCAL

## 2014-09-14 MED ORDER — VERAPAMIL HCL 2.5 MG/ML IV SOLN
INTRAVENOUS | Status: AC
  Filled 2014-09-14: qty 2

## 2014-09-14 MED ORDER — SODIUM BICARBONATE 650 MG PO TABS
650.0000 mg | ORAL_TABLET | Freq: Two times a day (BID) | ORAL | Status: DC
  Administered 2014-09-14 – 2014-09-16 (×5): 650 mg via ORAL
  Filled 2014-09-14 (×6): qty 1

## 2014-09-14 MED ORDER — FENTANYL CITRATE 0.05 MG/ML IJ SOLN
INTRAMUSCULAR | Status: AC
  Filled 2014-09-14: qty 2

## 2014-09-14 MED ORDER — MICONAZOLE NITRATE 100 MG VA SUPP: 100.0000 mg | Freq: Every day | VAGINAL | Status: DC

## 2014-09-14 MED ORDER — HEPARIN SODIUM (PORCINE) 1000 UNIT/ML IJ SOLN
INTRAMUSCULAR | Status: AC
  Filled 2014-09-14: qty 1

## 2014-09-14 MED ORDER — CLOPIDOGREL BISULFATE 300 MG PO TABS
ORAL_TABLET | ORAL | Status: AC
  Filled 2014-09-14: qty 2

## 2014-09-14 MED ORDER — NITROGLYCERIN 1 MG/10 ML FOR IR/CATH LAB
INTRA_ARTERIAL | Status: AC
  Filled 2014-09-14: qty 10

## 2014-09-14 MED ORDER — MIDAZOLAM HCL 2 MG/2ML IJ SOLN
INTRAMUSCULAR | Status: AC
  Filled 2014-09-14: qty 2

## 2014-09-14 MED ORDER — ASPIRIN 81 MG PO CHEW
81.0000 mg | CHEWABLE_TABLET | Freq: Every day | ORAL | Status: DC
  Administered 2014-09-15 – 2014-09-16 (×2): 81 mg via ORAL
  Filled 2014-09-14 (×2): qty 1

## 2014-09-14 MED ORDER — CLOPIDOGREL BISULFATE 75 MG PO TABS
75.0000 mg | ORAL_TABLET | Freq: Every day | ORAL | Status: DC
  Administered 2014-09-15 – 2014-09-16 (×2): 75 mg via ORAL
  Filled 2014-09-14 (×2): qty 1

## 2014-09-14 MED ORDER — FLUCONAZOLE 200 MG PO TABS
200.0000 mg | ORAL_TABLET | Freq: Once | ORAL | Status: AC
  Administered 2014-09-14: 200 mg via ORAL
  Filled 2014-09-14: qty 1

## 2014-09-14 MED ORDER — LIDOCAINE HCL (PF) 1 % IJ SOLN
INTRAMUSCULAR | Status: AC
  Filled 2014-09-14: qty 30

## 2014-09-14 MED ORDER — MICONAZOLE NITRATE 100 MG VA SUPP
100.0000 mg | Freq: Every day | VAGINAL | Status: DC
  Administered 2014-09-14 – 2014-09-15 (×2): 100 mg via VAGINAL
  Filled 2014-09-14: qty 7

## 2014-09-14 MED ORDER — HEPARIN (PORCINE) IN NACL 2-0.9 UNIT/ML-% IJ SOLN
INTRAMUSCULAR | Status: AC
  Filled 2014-09-14: qty 1500

## 2014-09-14 MED ORDER — FLUCONAZOLE 100 MG PO TABS
100.0000 mg | ORAL_TABLET | Freq: Every day | ORAL | Status: DC
  Administered 2014-09-15 – 2014-09-16 (×2): 100 mg via ORAL
  Filled 2014-09-14 (×2): qty 1

## 2014-09-14 MED ORDER — ACTIVE PARTNERSHIP FOR HEALTH OF YOUR HEART BOOK
Freq: Once | Status: AC
  Administered 2014-09-14: 21:00:00
  Filled 2014-09-14: qty 1

## 2014-09-14 MED ORDER — SODIUM CHLORIDE 0.9 % IV SOLN: INTRAVENOUS | Status: AC

## 2014-09-14 MED ORDER — CEFTRIAXONE SODIUM IN DEXTROSE 20 MG/ML IV SOLN
1.0000 g | INTRAVENOUS | Status: DC
  Administered 2014-09-14 – 2014-09-16 (×3): 1 g via INTRAVENOUS
  Filled 2014-09-14 (×3): qty 50

## 2014-09-14 NOTE — Progress Notes (Signed)
Pt brought to holding awaiting 6C bed. Report just called to [place], RN and pt will be transferring to 6C08 at this time. Pt had PCI of RCA via right radial access. Site is WNL with TR band intact, placed at 0950 with 13cc's of air. Vitals remain WNL at this time.  No complications noted.

## 2014-09-14 NOTE — Progress Notes (Signed)
TR BAND REMOVAL  LOCATION:    right radial  DEFLATED PER PROTOCOL:    Yes.    TIME BAND OFF / DRESSING APPLIED:    1400   SITE UPON ARRIVAL:    Level 0  SITE AFTER BAND REMOVAL:    Level 0  REVERSE ALLEN'S TEST:     positive  CIRCULATION SENSATION AND MOVEMENT:    Within Normal Limits   Yes.    COMMENTS:   Tolerated procedure well 

## 2014-09-14 NOTE — H&P (View-Only) (Signed)
CONSULTATION NOTE  Reason for Consult: Chest pain  Requesting Physician: Dr. Blaine Hamper  Cardiologist: None (New)  HPI: This is a 43 y.o. female with a past medical history significant for hypertension, smoking, dyslipidemia and stage II chronic kidney disease. She now presents with chest pain. She reports the pain is constant and about a 6 out of 10 in severity and radiates somewhat to her back. She was notably very hypertensive on admission. Pain does not radiate to her jaw or to her arm or is not associated with nausea. She's had significant progressive fatigue since the middle of last week. Her symptoms are deathly worse with exertion and relieved by rest. EKG is notable for sinus rhythm with lateral T-wave inversions. Initial troponin was negative. Chest x-ray showed no acute abnormalities. She was started on heparin by hospital medicine service and we were consulted for evaluation.  PMHx:  Past Medical History  Diagnosis Date  . Hypertension     > 5 years  . Chronic kidney disease     she sthinks has stage 2 CKD, has had US doppler and has some stenosis , this was done in [place]   . Hyperlipidemia    Past Surgical History  Procedure Laterality Date  . Hernia repair      FAMHx: Family History  Problem Relation Age of Onset  . Diabetes Mother   . Heart disease Mother   . Hyperlipidemia Mother   . Hypertension Mother     SOCHx:  reports that she has been smoking Cigarettes.  She has been smoking about 0.50 packs per day. She does not have any smokeless tobacco history on file. Her alcohol and drug histories are not on file.  ALLERGIES: No Known Allergies  ROS: A comprehensive review of systems was negative except for: Constitutional: positive for fatigue Respiratory: positive for dyspnea on exertion Cardiovascular: positive for chest pain  HOME MEDICATIONS: Prescriptions prior to admission  Medication Sig Dispense Refill Last Dose  . amoxicillin (AMOXIL) 875  MG tablet Take 1 tablet (875 mg total) by mouth 2 (two) times daily. 14 tablet 0 09/12/2014 at Unknown time  . diltiazem (DILACOR XR) 240 MG 24 hr capsule Take 1 capsule (240 mg total) by mouth daily. 30 capsule 5 09/12/2014 at Unknown time  . hydrALAZINE (APRESOLINE) 25 MG tablet Take 1 tablet (25 mg total) by mouth 3 (three) times daily. 90 tablet 5 09/12/2014 at Unknown time  . triamterene-hydrochlorothiazide (DYAZIDE) 37.5-25 MG per capsule Take 1 each (1 capsule total) by mouth daily. 30 capsule 5 09/12/2014 at Unknown time    HOSPITAL MEDICATIONS: Prior to Admission:  Prescriptions prior to admission  Medication Sig Dispense Refill Last Dose  . amoxicillin (AMOXIL) 875 MG tablet Take 1 tablet (875 mg total) by mouth 2 (two) times daily. 14 tablet 0 09/12/2014 at Unknown time  . diltiazem (DILACOR XR) 240 MG 24 hr capsule Take 1 capsule (240 mg total) by mouth daily. 30 capsule 5 09/12/2014 at Unknown time  . hydrALAZINE (APRESOLINE) 25 MG tablet Take 1 tablet (25 mg total) by mouth 3 (three) times daily. 90 tablet 5 09/12/2014 at Unknown time  . triamterene-hydrochlorothiazide (DYAZIDE) 37.5-25 MG per capsule Take 1 each (1 capsule total) by mouth daily. 30 capsule 5 09/12/2014 at Unknown time    VITALS: Blood pressure 105/57, pulse 64, temperature 97.9 F (36.6 C), temperature source Oral, resp. rate 12, height 5\' 1"  (1.549 m), weight 147 lb (66.679 kg), last menstrual period 08/24/2014, SpO2 100 %.  PHYSICAL EXAM: General appearance: alert and no distress Neck: no carotid bruit and no JVD Lungs: clear to auscultation bilaterally Heart: regular rate and rhythm, S1, S2 normal, no murmur, click, rub or gallop Abdomen: soft, non-tender; bowel sounds normal; no masses,  no organomegaly Extremities: extremities normal, atraumatic, no cyanosis or edema Pulses: 2+ and symmetric Skin: Skin color, texture, turgor normal. No rashes or lesions Neurologic: Grossly normal Psych:  Normal  LABS: Results for orders placed or performed during the hospital encounter of 09/13/14 (from the past 48 hour(s))  I-stat troponin, ED     Status: None   Collection Time: 09/13/14  2:43 AM  Result Value Ref Range   Troponin i, poc 0.01 0.00 - 0.08 ng/mL   Comment 3            Comment: Due to the release kinetics of cTnI, a negative result within the first hours of the onset of symptoms does not rule out myocardial infarction with certainty. If myocardial infarction is still suspected, repeat the test at appropriate intervals.   I-stat chem 8, ed     Status: Abnormal   Collection Time: 09/13/14  2:44 AM  Result Value Ref Range   Sodium 137 137 - 147 mEq/L   Potassium 4.1 3.7 - 5.3 mEq/L   Chloride 102 96 - 112 mEq/L   BUN 18 6 - 23 mg/dL   Creatinine, Ser 1.20 (H) 0.50 - 1.10 mg/dL   Glucose, Bld 97 70 - 99 mg/dL   Calcium, Ion 1.23 1.12 - 1.23 mmol/L   TCO2 21 0 - 100 mmol/L   Hemoglobin 14.6 12.0 - 15.0 g/dL   HCT 43.0 36.0 - 46.0 %  Drug screen panel, emergency     Status: Abnormal   Collection Time: 09/13/14  5:00 AM  Result Value Ref Range   Opiates NONE DETECTED NONE DETECTED   Cocaine NONE DETECTED NONE DETECTED   Benzodiazepines NONE DETECTED NONE DETECTED   Amphetamines NONE DETECTED NONE DETECTED   Tetrahydrocannabinol POSITIVE (A) NONE DETECTED   Barbiturates NONE DETECTED NONE DETECTED    Comment:        DRUG SCREEN FOR MEDICAL PURPOSES ONLY.  IF CONFIRMATION IS NEEDED FOR ANY PURPOSE, NOTIFY LAB WITHIN 5 DAYS.        LOWEST DETECTABLE LIMITS FOR URINE DRUG SCREEN Drug Class       Cutoff (ng/mL) Amphetamine      1000 Barbiturate      200 Benzodiazepine   154 Tricyclics       008 Opiates          300 Cocaine          300 THC              50     IMAGING: Dg Chest 2 View  09/13/2014   CLINICAL DATA:  Initial valuation for acute chest pain.  EXAM: CHEST  2 VIEW  COMPARISON:  None.  FINDINGS: The cardiac and mediastinal silhouettes are within  normal limits.  The lungs are normally inflated. No airspace consolidation, pleural effusion, or pulmonary edema is identified. There is no pneumothorax.  No acute osseous abnormality identified.  IMPRESSION: No active cardiopulmonary disease.   Electronically Signed   By: Jeannine Boga M.D.   On: 09/13/2014 02:30   Ct Angio Chest Aortic Dissect W &/or W/o  09/13/2014   CLINICAL DATA:  Acute onset of mid chest pain for 1 week. Intermittent tachycardia. Elevated blood pressure. Initial encounter.  EXAM: CT  ANGIOGRAPHY CHEST WITH CONTRAST  TECHNIQUE: Multidetector CT imaging of the chest was performed using the standard protocol during bolus administration of intravenous contrast. Multiplanar CT image reconstructions and MIPs were obtained to evaluate the vascular anatomy.  CONTRAST:  164mL OMNIPAQUE IOHEXOL 350 MG/ML SOLN  COMPARISON:  Chest radiograph performed earlier today at 2:03 a.m.  FINDINGS: There is no evidence of aortic dissection. No aneurysmal dilatation is seen. No calcific atherosclerotic disease is identified.  There is no evidence of pulmonary embolus.  The lungs are clear bilaterally. A small lymph node is noted along the left major fissure. There is no evidence of significant focal consolidation, pleural effusion or pneumothorax. No masses are identified; no abnormal focal contrast enhancement is seen.  The mediastinum is unremarkable in appearance. No mediastinal lymphadenopathy is seen. No pericardial effusion is identified. The great vessels are grossly unremarkable in appearance. No axillary lymphadenopathy is seen. The visualized portions of the thyroid gland are unremarkable in appearance.  The visualized portions of the liver and spleen are unremarkable. The visualized portions of the pancreas, gallbladder, stomach, adrenal glands and kidneys are within normal limits.  No acute osseous abnormalities are seen.  Review of the MIP images confirms the above findings.  IMPRESSION: 1. No  evidence of aortic dissection. No aneurysmal dilatation seen. No calcific atherosclerotic disease identified. 2. No evidence of pulmonary embolus. 3. Lungs clear bilaterally.   Electronically Signed   By: Garald Balding M.D.   On: 09/13/2014 06:15    HOSPITAL DIAGNOSES: Principal Problem:   Unstable angina Active Problems:   Essential hypertension   AKI (acute kidney injury)   HLD (hyperlipidemia)   Tobacco abuse   Otitis   IMPRESSION: 1. Unstable angina 2. Abnormal ischemic EKG 3. Tobacco abuse with recent cessation 4. Hypertension 5. Stage II chronic kidney disease secondary to poorly controlled hypertension 6. Dyslipidemia-LDL 199  RECOMMENDATION: 1. Unstable angina - Anne Fu has been describing worsening chest pressure which is fairly constant but waxes and wanes over the past week. She's had progressive discomfort with exertion which is relieved by rest as well as significant fatigue doing minimal activities. Her EKG shows new anterolateral T-wave inversions. There is a strong family history of coronary disease and she has multiple coronary risk factors including poorly controlled hypertension, dyslipidemia and tobacco abuse. Based on these findings I'm recommending cardiac catheterization. I discussed the risks and benefits with her today and she provided informed consent. She is currently on IV heparin and we will start IV nitro drip based on her persistent 3 out of 10 chest pain. 2. Accelerated hypertension- blood pressure is actually low normal. Stop diltiazem and switch to metoprolol 12.5 mg twice a day.  3. Dyslipidemia- now on high-dose Lipitor 80 mg daily. 4. CKD2- aggressive hydration overnight for planned cardiac catheterization tomorrow  Time Spent Directly with Patient: 30 minutes  Pixie Casino, MD, [facility] Attending Cardiologist [facility]  HILTY,Kenneth C 09/13/2014, 9:35 AM

## 2014-09-14 NOTE — Progress Notes (Signed)
Patient c/o chest pain 4/10, nitro drip increased to 3 mcg/min with no relief.  Pt continued to c/o of chest pain 8/10, 2 mg morphine IV given and EKG obtained showing septal infart, age undetermined. B/P 152/82, heart rate 80's- low 100's, O2 sats 100 RA. MD J.Kelly made aware, ordered to increase nitro to 20 mcg/min. Nurse at bedside. Will continue to monitor patient.

## 2014-09-14 NOTE — Progress Notes (Signed)
[facility] PROGRESS NOTE  Anna Henson JJH:[id number] DOB: [birth year] DOA: 09/13/2014 PCP: No primary care provider on file.  Assessment/Plan: #1 unstable angina Patient had chest pain and shortness of breath last night. Cardiac enzymes negative 3. 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 Patient with clinical improvement post cardiac catheterization. Continue aspirin, Plavix, statin, beta blocker. Tobacco cessation. Per cardiology.  #2 accelerated hypertension Patient diltiazem was discontinued. Patient has been started on metoprolol and hydralazine for blood pressure control per cardiology. Follow.  #3 probable UTI Urine cultures are pending. Place patient on IV Rocephin.  #4 otitis media Continue home regimen of amoxicillin  #5 vaginal candidiasis/candiduria Will place on oral Diflucan. Miconazole suppositories.  #6 hyperlipidemia LDL equal 199. Goal LDL less than 70. Continue Lipitor.  #7 chronic kidney disease stage II Stable. Monitor closely post cardiac catheterization. Continue hydration.  #8 tobacco abuse Patient states has recently quit. Nicotine patch. Follow.  #9 prophylaxis SCDs for DVT prophylaxis.    Code Status: full Family Communication: updated patient and mother at bedside. Disposition Plan: Hopefully home in 1-2 days.   Consultants:  Cardiology: Dr. Debara Pickett 09/13/2014  Procedures:  CT angiogram chest 09/13/2014  Chest x-ray 09/13/2014  2-D echo 09/13/2014   Cardiac catheterization with PTCA/DS 1 mid RCA 09/14/2014 Dr. Angelena Form  Antibiotics:  Amoxicillin 09/13/2014 last dose to be on 09/15/2014  HPI/Subjective: Patient states had some chest pain or shortness of breath last night and thought she was on a diet. Patient is status post cardiac catheterization states chest pain has resolved denies any  shortness of breath and states she's feeling a whole lot better. Patient also complaining of vaginal yeast infection.  Objective: Filed Vitals:   09/14/14 1100  BP:   Pulse: 84  Temp:   Resp:     Intake/Output Summary (Last 24 hours) at 09/14/14 1137 Last data filed at 09/14/14 0300  Gross per 24 hour  Intake    480 ml  Output   1350 ml  Net   -870 ml   Filed Weights   09/13/14 0116 09/13/14 0630 09/14/14 0400  Weight: 65.545 kg (144 lb 8 oz) 66.679 kg (147 lb) 67.994 kg (149 lb 14.4 oz)    Exam:   General:  NAD  Cardiovascular: RRR  Respiratory: CTAB  Abdomen: Soft, nontender, nondistended, positive bowel sounds.  Musculoskeletal: No clubbing cyanosis or edema.  Data Reviewed: Basic Metabolic Panel:  Recent Labs Lab 09/07/14 1153 09/13/14 0244 09/13/14 0855 09/14/14 0434  NA 137 137 135* 140  K 4.1 4.1 4.2 4.5  CL 105 102 102 107  CO2 22  --  20 17*  GLUCOSE 92 97 87 98  BUN 9 18 14 14   CREATININE 1.03 1.20* 1.03 0.98  CALCIUM 9.5  --  9.0 8.7   Liver Function Tests:  Recent Labs Lab 09/07/14 1153 09/13/14 0855 09/14/14 0434  AST 18 19 16   ALT 12 16 11   ALKPHOS 63 56 48  BILITOT 0.5 0.4 0.3  PROT 7.1 6.8 6.0  ALBUMIN 4.2 3.6 3.0*   No results for input(s): LIPASE, AMYLASE in the last 168 hours. No results for input(s): AMMONIA in the last 168 hours. CBC:  Recent Labs Lab 09/07/14 1158 09/09/14 1220 09/13/14 0244 09/13/14 0855  WBC 14.5* 14.9*  --  10.2  NEUTROABS  --   --   --  5.8  HGB 12.6 15.1 14.6 12.6  HCT 38.3 46.0 43.0 35.3*  MCV 91.9 93.0  --  88.9  PLT  --   --   --  300   Cardiac Enzymes:  Recent Labs Lab 09/13/14 0855 09/13/14 1358 09/13/14 1906  TROPONINI <0.30 <0.30 <0.30   BNP (last 3 results) No results for input(s): PROBNP in the last 8760 hours. CBG: No results for input(s): GLUCAP in the last 168 hours.  Recent Results (from the past 240 hour(s))  MRSA PCR Screening     Status: None   Collection  Time: 09/13/14  7:35 AM  Result Value Ref Range Status   MRSA by PCR NEGATIVE NEGATIVE Final    Comment:        The GeneXpert MRSA Assay (FDA approved for NASAL specimens only), is one component of a comprehensive MRSA colonization surveillance program. It is not intended to diagnose MRSA infection nor to guide or monitor treatment for MRSA infections.      Studies: Dg Chest 2 View  09/13/2014   CLINICAL DATA:  Initial valuation for acute chest pain.  EXAM: CHEST  2 VIEW  COMPARISON:  None.  FINDINGS: The cardiac and mediastinal silhouettes are within normal limits.  The lungs are normally inflated. No airspace consolidation, pleural effusion, or pulmonary edema is identified. There is no pneumothorax.  No acute osseous abnormality identified.  IMPRESSION: No active cardiopulmonary disease.   Electronically Signed   By: Jeannine Boga M.D.   On: 09/13/2014 02:30   Ct Angio Chest Aortic Dissect W &/or W/o  09/13/2014   CLINICAL DATA:  Acute onset of mid chest pain for 1 week. Intermittent tachycardia. Elevated blood pressure. Initial encounter.  EXAM: CT ANGIOGRAPHY CHEST WITH CONTRAST  TECHNIQUE: Multidetector CT imaging of the chest was performed using the standard protocol during bolus administration of intravenous contrast. Multiplanar CT image reconstructions and MIPs were obtained to evaluate the vascular anatomy.  CONTRAST:  166mL OMNIPAQUE IOHEXOL 350 MG/ML SOLN  COMPARISON:  Chest radiograph performed earlier today at 2:03 a.m.  FINDINGS: There is no evidence of aortic dissection. No aneurysmal dilatation is seen. No calcific atherosclerotic disease is identified.  There is no evidence of pulmonary embolus.  The lungs are clear bilaterally. A small lymph node is noted along the left major fissure. There is no evidence of significant focal consolidation, pleural effusion or pneumothorax. No masses are identified; no abnormal focal contrast enhancement is seen.  The mediastinum is  unremarkable in appearance. No mediastinal lymphadenopathy is seen. No pericardial effusion is identified. The great vessels are grossly unremarkable in appearance. No axillary lymphadenopathy is seen. The visualized portions of the thyroid gland are unremarkable in appearance.  The visualized portions of the liver and spleen are unremarkable. The visualized portions of the pancreas, gallbladder, stomach, adrenal glands and kidneys are within normal limits.  No acute osseous abnormalities are seen.  Review of the MIP images confirms the above findings.  IMPRESSION: 1. No evidence of aortic dissection. No aneurysmal dilatation seen. No calcific atherosclerotic disease identified. 2. No evidence of pulmonary embolus. 3. Lungs clear bilaterally.   Electronically Signed   By: Garald Balding M.D.   On: 09/13/2014 06:15    Scheduled Meds: . amoxicillin  875 mg Oral BID  . [START ON 09/15/2014] aspirin  81 mg Oral Daily  . atorvastatin  80 mg Oral q1800  . cefTRIAXone (ROCEPHIN)  IV  1 g Intravenous Q24H  . [START ON 09/15/2014] clopidogrel  75 mg Oral  Q breakfast  . [START ON 09/15/2014] fluconazole  100 mg Oral Daily  . fluconazole  200 mg Oral Once  . hydrALAZINE  25 mg Oral TID  . metoprolol tartrate  12.5 mg Oral BID  . miconazole  100 mg Vaginal QHS  . nicotine  21 mg Transdermal Daily  . sodium bicarbonate  650 mg Oral BID  . sodium chloride  3 mL Intravenous Q12H   Continuous Infusions: . sodium chloride 75 mL/hr at 09/14/14 1046    Principal Problem:   Unstable angina Active Problems:   Essential hypertension   AKI (acute kidney injury)   HLD (hyperlipidemia)   Tobacco abuse   Otitis   UTI (urinary tract infection): Probable   Candida infection of genital region    Time spent: Fall[place] MD [facility] Pager [phone]. If 7PM-7AM, please contact night-coverage at [email or web address], password [facility] 09/14/2014, 11:37 AM  LOS: 1 day

## 2014-09-14 NOTE — Plan of Care (Signed)
Problem: Consults Goal: Chest Pain Patient Education (See Patient Education module for education specifics.)  Outcome: Completed/Met Date Met:  09/14/14 Goal: Skin Care Protocol Initiated - if Braden Score 18 or less If consults are not indicated, leave blank or document N/A  Outcome: Completed/Met Date Met:  09/14/14 Goal: Tobacco Cessation referral if indicated Outcome: Completed/Met Date Met:  09/14/14 Goal: Nutrition Consult-if indicated Outcome: Not Applicable Date Met:  82/99/37 Goal: Diabetes Guidelines if Diabetic/Glucose > 140 If diabetic or lab glucose is > 140 mg/dl - Initiate Diabetes/Hyperglycemia Guidelines & Document Interventions  Outcome: Not Applicable Date Met:  16/96/78  Problem: Phase I Progression Outcomes Goal: Hemodynamically stable Outcome: Completed/Met Date Met:  09/14/14 Goal: Anginal pain relieved Outcome: Completed/Met Date Met:  09/14/14 Goal: Aspirin unless contraindicated Outcome: Completed/Met Date Met:  09/14/14 Goal: Other Phase I Outcomes/Goals Outcome: Not Applicable Date Met:  93/81/01  Problem: Phase II Progression Outcomes Goal: Hemodynamically stable Outcome: Completed/Met Date Met:  09/14/14 Goal: Anginal pain relieved Outcome: Completed/Met Date Met:  09/14/14 Goal: Stress Test if indicated Outcome: Not Applicable Date Met:  75/10/25 Goal: Cath/PCI Day Path if indicated Outcome: Completed/Met Date Met:  09/14/14 Goal: CV Risk Factors identified Outcome: Completed/Met Date Met:  09/14/14 Goal: Cardiac Rehab if ordered Outcome: Completed/Met Date Met:  09/14/14 Goal: If positive for MI, change to MI Path Outcome: Not Applicable Date Met:  85/27/78 Goal: Other Phase II Outcomes/Goals Outcome: Not Applicable Date Met:  24/23/53  Problem: Phase III Progression Outcomes Goal: Hemodynamically stable Outcome: Completed/Met Date Met:  09/14/14 Goal: No anginal pain Outcome: Completed/Met Date Met:  09/14/14 Goal: Cath/PCI Path as  indicated Outcome: Completed/Met Date Met:  09/14/14 Goal: Vascular site scale level 0 - I Vascular Site Scale Level 0: No bruising/bleeding/hematoma Level I (Mild): Bruising/Ecchymosis, minimal bleeding/ooozing, palpable hematoma < 3 cm Level II (Moderate): Bleeding not affecting hemodynamic parameters, pseudoaneurysm, palpable hematoma > 3 cm Level III (Severe) Bleeding which affects hemodynamic parameters or retroperitoneal hemorrhage  Outcome: Completed/Met Date Met:  09/14/14 Goal: Discharge plan remains appropriate-arrangements made Outcome: Completed/Met Date Met:  09/14/14 Goal: Tolerating diet Outcome: Completed/Met Date Met:  09/14/14 Goal: If positive for MI, change to MI Path Outcome: Not Applicable Date Met:  61/44/31 Goal: Other Phase III Outcomes/Goals Outcome: Not Applicable Date Met:  54/00/86

## 2014-09-14 NOTE — Interval H&P Note (Signed)
History and Physical Interval Note:  09/14/2014 7:51 AM  Anna Henson  has presented today for cardiac cath with the diagnosis of chest pain.  The various methods of treatment have been discussed with the patient and family. After consideration of risks, benefits and other options for treatment, the patient has consented to  Procedure(s): LEFT HEART CATHETERIZATION WITH CORONARY ANGIOGRAM (N/A) as a surgical intervention .  The patient's history has been reviewed, patient examined, no change in status, stable for surgery.  I have reviewed the patient's chart and labs.  Questions were answered to the patient's satisfaction.    Cath Lab Visit (complete for each Cath Lab visit)  Clinical Evaluation Leading to the Procedure:   ACS: No.  Non-ACS:    Anginal Classification: CCS III  Anti-ischemic medical therapy: Minimal Therapy (1 class of medications)  Non-Invasive Test Results: No non-invasive testing performed  Prior CABG: No previous CABG        Anna Henson

## 2014-09-14 NOTE — CV Procedure (Signed)
      Cardiac Catheterization Operative Report  Anna Henson [id number] 12/7/20159:43 AM No primary care provider on file.  Procedure Performed:  1. Left Heart Catheterization 2. Selective Coronary Angiography 3. Left ventricular angiogram 4. PTCA/DES x 1 mid RCA  Operator: Lauree Chandler, MD  Arterial access site:  Right radial artery.   Indication:  43 yo female with history of HTN, HLD, tobacco abuse admitted with unstable angina.                                      Procedure Details: The risks, benefits, complications, treatment options, and expected outcomes were discussed with the patient. The patient and/or family concurred with the proposed plan, giving informed consent. The patient was brought to the cath lab after IV hydration was begun and oral premedication was given. The patient was further sedated with Versed and Fentanyl. The right wrist was assessed with a modified Allens test which was positive. The right wrist was prepped and draped in a sterile fashion. 1% lidocaine was used for local anesthesia. Using the modified Seldinger access technique, a 5 French sheath was placed in the right radial artery. 3 mg Verapamil was given through the sheath. 3500 units IV heparin was given. Standard diagnostic catheters were used to perform selective coronary angiography. A pigtail catheter was used to perform a left ventricular angiogram. She was found to have a severe stenosis in the mid RCA. I elected to proceed to PCI of the RCA.   PCI Note: She was given an additional 6000 units IV heparin. ACT was over 200. She was given Plavix 600 mg po x 1. I engaged the RCA with a JR4 guiding catheter. I then advanced a Cougar IC wire down the RCA. A 2.0 x 12 mm balloon was used to pre-dilate the mid stenosis. A 2.25 x 12 mm Promus Premier DES was deployed in the mid RCA. A 2.5 x 8 mm [place] balloon was used to post-dilate the stented segment. The stenosis was taken from 90% down to 0%.    The sheath was removed from the right radial artery and a hemostasis band was applied at the arteriotomy site on the right wrist.  There were no immediate complications. The patient was taken to the recovery area in stable condition.   Hemodynamic Findings: Central aortic pressure: 130/75 Left ventricular pressure: 125/7/19  Angiographic Findings:  Left main: No obstructive disease.  Left Anterior Descending Artery: Large caliber vessel that courses to the apex. Moderate caliber diagonal branch. No obstructive disease.   Circumflex Artery: Large caliber vessel that gives off one large obtuse marginal branch. The mid vessel has 30% stenosis.   Right Coronary Artery: Moderate caliber dominant vessel with 30% proximal stenosis, 90% mid stenosis.   Left Ventricular Angiogram: LVEF=65-70%  Impression: 1. Unstable angina  2. 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 Successful PTCA/DES x 1 mid RCA 4. Normal LV function  Recommendations: Will continue ASA, Plavix, statin, beta blocker.        Complications:  None. The patient tolerated the procedure well.

## 2014-09-15 ENCOUNTER — Encounter (HOSPITAL_COMMUNITY): Payer: Self-pay | Admitting: Physician Assistant

## 2014-09-15 ENCOUNTER — Telehealth: Payer: Self-pay | Admitting: Internal Medicine

## 2014-09-15 DIAGNOSIS — R519 Headache, unspecified: Secondary | ICD-10-CM | POA: Clinically undetermined

## 2014-09-15 DIAGNOSIS — I25110 Atherosclerotic heart disease of native coronary artery with unstable angina pectoris: Principal | ICD-10-CM

## 2014-09-15 DIAGNOSIS — R51 Headache: Secondary | ICD-10-CM

## 2014-09-15 DIAGNOSIS — I2510 Atherosclerotic heart disease of native coronary artery without angina pectoris: Secondary | ICD-10-CM | POA: Diagnosis present

## 2014-09-15 LAB — BASIC METABOLIC PANEL
Anion gap: 14 (ref 5–15)
BUN: 11 mg/dL (ref 6–23)
CO2: 20 mEq/L (ref 19–32)
Calcium: 9.3 mg/dL (ref 8.4–10.5)
Chloride: 105 mEq/L (ref 96–112)
Creatinine, Ser: 0.91 mg/dL (ref 0.50–1.10)
GFR calc Af Amer: 89 mL/min — ABNORMAL LOW (ref >90)
GFR calc non Af Amer: 77 mL/min — ABNORMAL LOW (ref >90)
Glucose, Bld: 88 mg/dL (ref 70–99)
Potassium: 4.1 mEq/L (ref 3.7–5.3)
Sodium: 139 mEq/L (ref 137–147)

## 2014-09-15 LAB — CBC
HCT: 35.9 % — ABNORMAL LOW (ref 36.0–46.0)
Hemoglobin: 12 g/dL (ref 12.0–15.0)
MCH: 29.4 pg (ref 26.0–34.0)
MCHC: 33.4 g/dL (ref 30.0–36.0)
MCV: 88 fL (ref 78.0–100.0)
Platelets: 282 10*3/uL (ref 150–400)
RBC: 4.08 MIL/uL (ref 3.87–5.11)
RDW: 13.6 % (ref 11.5–15.5)
WBC: 10.1 10*3/uL (ref 4.0–10.5)

## 2014-09-15 LAB — HEPATIC FUNCTION PANEL
ALT: 12 U/L (ref 0–35)
AST: 22 U/L (ref 0–37)
Albumin: 3.4 g/dL — ABNORMAL LOW (ref 3.5–5.2)
Alkaline Phosphatase: 50 U/L (ref 39–117)
Bilirubin, Direct: <0.2 mg/dL (ref 0.0–0.3)
Total Bilirubin: 0.4 mg/dL (ref 0.3–1.2)
Total Protein: 6.7 g/dL (ref 6.0–8.3)

## 2014-09-15 LAB — LIPID PANEL
Cholesterol: 186 mg/dL (ref 0–200)
HDL: 28 mg/dL — ABNORMAL LOW (ref >39)
LDL Cholesterol: 134 mg/dL — ABNORMAL HIGH (ref 0–99)
Total CHOL/HDL Ratio: 6.6 RATIO
Triglycerides: 119 mg/dL (ref <150)
VLDL: 24 mg/dL (ref 0–40)

## 2014-09-15 MED ORDER — TRIAMTERENE-HCTZ 37.5-25 MG PO CAPS
1.0000 | ORAL_CAPSULE | Freq: Every day | ORAL | Status: DC
  Filled 2014-09-15: qty 1

## 2014-09-15 MED ORDER — HYDROCODONE-ACETAMINOPHEN 5-325 MG PO TABS
2.0000 | ORAL_TABLET | Freq: Once | ORAL | Status: AC
  Administered 2014-09-15: 09:00:00 1 via ORAL
  Filled 2014-09-15: qty 1

## 2014-09-15 MED ORDER — TRIAMTERENE-HCTZ 37.5-25 MG PO TABS
1.0000 | ORAL_TABLET | Freq: Every day | ORAL | Status: DC
  Administered 2014-09-15 – 2014-09-16 (×2): 1 via ORAL
  Filled 2014-09-15 (×2): qty 1

## 2014-09-15 MED ORDER — TRIAMTERENE-HCTZ 37.5-25 MG PO TABS
1.0000 | ORAL_TABLET | Freq: Every day | ORAL | Status: DC
  Filled 2014-09-15: qty 1

## 2014-09-15 MED ORDER — HYDRALAZINE HCL 20 MG/ML IJ SOLN
10.0000 mg | INTRAMUSCULAR | Status: DC | PRN
  Filled 2014-09-15: qty 1

## 2014-09-15 MED ORDER — METOPROLOL TARTRATE 25 MG PO TABS
25.0000 mg | ORAL_TABLET | Freq: Two times a day (BID) | ORAL | Status: DC
  Administered 2014-09-15: 25 mg via ORAL

## 2014-09-15 MED ORDER — HYDRALAZINE HCL 25 MG PO TABS
25.0000 mg | ORAL_TABLET | Freq: Once | ORAL | Status: AC
  Administered 2014-09-15: 25 mg via ORAL
  Filled 2014-09-15: qty 1

## 2014-09-15 MED ORDER — PROCHLORPERAZINE EDISYLATE 5 MG/ML IJ SOLN
10.0000 mg | Freq: Four times a day (QID) | INTRAMUSCULAR | Status: DC | PRN
Start: 2014-09-15 — End: 2014-09-16
  Administered 2014-09-15: 14:00:00 10 mg via INTRAVENOUS
  Filled 2014-09-15 (×3): qty 2

## 2014-09-15 MED ORDER — METOPROLOL TARTRATE 50 MG PO TABS
50.0000 mg | ORAL_TABLET | Freq: Two times a day (BID) | ORAL | Status: DC
  Administered 2014-09-15 – 2014-09-16 (×2): 50 mg via ORAL
  Filled 2014-09-15 (×3): qty 1

## 2014-09-15 NOTE — Progress Notes (Signed)
[phone] Pt with headache and wants to walk later with RN when meds have had more time to work. Reviewed NTG use, stent/plavix, risk factors including smoking cessation and diet changes. Gave pt smoking cessation handouts and fake cigarette. Pt plans to use nicotine patches. Pt had not been watching diet but we discussed heart healthy choices and I reviewed her lipid panel with her. Discussed CRP 2 but pt declined due to work schedule. To start walking as ex. Pt stated ready to make changes. Graylon Good RN BSN 09/15/2014 9:46 AM

## 2014-09-15 NOTE — Progress Notes (Signed)
[facility] PROGRESS NOTE  Anna Henson YJE:[id number] DOB: Dec 24, 1970 DOA: 09/13/2014 PCP: No primary care provider on file.  Assessment/Plan: #1 unstable angina Patient denies chest pain and shortness of breath. Cardiac enzymes negative 3. 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 Patient with clinical improvement post cardiac catheterization. Continue aspirin, Plavix, statin, beta blocker, hydralazine, maxzide. Tobacco cessation. Per cardiology.  #2 accelerated hypertension Patient diltiazem was discontinued. Patient has been started on metoprolol and hydralazine for blood pressure control per cardiology. Maxzide resumed. Titrate metoprolol for BP. Follow.  #3 probable UTI Urine cultures are pending. Continue IV Rocephin D2/3.  #4 otitis media On IV Rocephin.   #5 vaginal candidiasis/candiduria Continue oral Diflucan. Miconazole suppositories.  #6 hyperlipidemia LDL equal 199. Repeat FLP with LDL 134. Goal LDL less than 70. Continue Lipitor.  #7 chronic kidney disease stage II Stable. Monitor closely post cardiac catheterization. Stable.  #8 headache Questionable etiology. May have been secondary to nitroglycerin. Patient has received some IV morphine. Will give IV Compazine 10 mg 1. Follow. Supportive care.  #9 tobacco abuse Patient states has recently quit. Nicotine patch. Follow.  #10 prophylaxis SCDs for DVT prophylaxis.    Code Status: full Family Communication: updated patient and mother at bedside. Disposition Plan: Hopefully home tomorrow.   Consultants:  Cardiology: Dr. Debara Pickett 09/13/2014  Procedures:  CT angiogram chest 09/13/2014  Chest x-ray 09/13/2014  2-D echo 09/13/2014   Cardiac catheterization with PTCA/DS 1 mid RCA 09/14/2014 Dr. Angelena Form  Antibiotics:  Amoxicillin 09/13/2014 last dose to be on  09/15/2014  HPI/Subjective: Patient denies any CP. No SOB. Patient c/o headache and episode of emesis.  Objective: Filed Vitals:   09/15/14 1157  BP: 141/75  Pulse: 71  Temp: 98.1 F (36.7 C)  Resp: 18    Intake/Output Summary (Last 24 hours) at 09/15/14 1310 Last data filed at 09/15/14 1259  Gross per 24 hour  Intake 1233.75 ml  Output   1025 ml  Net 208.75 ml   Filed Weights   09/13/14 0630 09/14/14 0400 09/15/14 0011  Weight: 66.679 kg (147 lb) 67.994 kg (149 lb 14.4 oz) 68.1 kg (150 lb 2.1 oz)    Exam:   General:  NAD  Cardiovascular: RRR  Respiratory: CTAB  Abdomen: Soft, nontender, nondistended, positive bowel sounds.  Musculoskeletal: No clubbing cyanosis or edema.  Data Reviewed: Basic Metabolic Panel:  Recent Labs Lab 09/13/14 0244 09/13/14 0855 09/14/14 0434 09/15/14 0329  NA 137 135* 140 139  K 4.1 4.2 4.5 4.1  CL 102 102 107 105  CO2  --  20 17* 20  GLUCOSE 97 87 98 88  BUN 18 14 14 11   CREATININE 1.20* 1.03 0.98 0.91  CALCIUM  --  9.0 8.7 9.3   Liver Function Tests:  Recent Labs Lab 09/13/14 0855 09/14/14 0434 09/15/14 0329  AST 19 16 22   ALT 16 11 12   ALKPHOS 56 48 50  BILITOT 0.4 0.3 0.4  PROT 6.8 6.0 6.7  ALBUMIN 3.6 3.0* 3.4*   No results for input(s): LIPASE, AMYLASE in the last 168 hours. No results for input(s): AMMONIA in the last 168 hours. CBC:  Recent Labs Lab 09/09/14 1220 09/13/14 0244 09/13/14 0855 09/15/14 0329  WBC 14.9*  --  10.2 10.1  NEUTROABS  --   --  5.8  --   HGB 15.1 14.6 12.6 12.0  HCT 46.0  43.0 35.3* 35.9*  MCV 93.0  --  88.9 88.0  PLT  --   --  300 282   Cardiac Enzymes:  Recent Labs Lab 09/13/14 0855 09/13/14 1358 09/13/14 1906  TROPONINI <0.30 <0.30 <0.30   BNP (last 3 results) No results for input(s): PROBNP in the last 8760 hours. CBG: No results for input(s): GLUCAP in the last 168 hours.  Recent Results (from the past 240 hour(s))  MRSA PCR Screening     Status: None    Collection Time: 09/13/14  7:35 AM  Result Value Ref Range Status   MRSA by PCR NEGATIVE NEGATIVE Final    Comment:        The GeneXpert MRSA Assay (FDA approved for NASAL specimens only), is one component of a comprehensive MRSA colonization surveillance program. It is not intended to diagnose MRSA infection nor to guide or monitor treatment for MRSA infections.      Studies: No results found.  Scheduled Meds: . aspirin  81 mg Oral Daily  . atorvastatin  80 mg Oral q1800  . cefTRIAXone (ROCEPHIN)  IV  1 g Intravenous Q24H  . clopidogrel  75 mg Oral Q breakfast  . fluconazole  100 mg Oral Daily  . hydrALAZINE  25 mg Oral TID  . metoprolol tartrate  25 mg Oral BID  . miconazole  100 mg Vaginal QHS  . nicotine  21 mg Transdermal Daily  . sodium bicarbonate  650 mg Oral BID  . sodium chloride  3 mL Intravenous Q12H  . triamterene-hydrochlorothiazide  1 tablet Oral Daily   Continuous Infusions:    Principal Problem:   Unstable angina Active Problems:   Essential hypertension   AKI (acute kidney injury)   HLD (hyperlipidemia)   Tobacco abuse   Otitis   UTI (urinary tract infection): Probable   Candida infection of genital region   CAD (coronary artery disease), native coronary artery   Headache    Time spent: [place] MD [facility] Pager [phone]. If 7PM-7AM, please contact night-coverage at [email or web address], password [facility] 09/15/2014, 1:10 PM  LOS: 2 days

## 2014-09-15 NOTE — Telephone Encounter (Signed)
She is still in the hospital, getting BP better controlled s/p stent

## 2014-09-15 NOTE — Progress Notes (Signed)
Patient Name: Anna Henson Date of Encounter: 09/15/2014  Principal Problem:   Unstable angina Active Problems:   Essential hypertension   AKI (acute kidney injury)   HLD (hyperlipidemia)   Tobacco abuse   Otitis   UTI (urinary tract infection): Probable   Candida infection of genital region   Primary Cardiologist: Dr. Debara Pickett  Patient Profile: 43 yo white female with PMH HTN, HLD, tobacco use was evaluated by cardiology for chest pain, cath with 2.25 x 12 mm Promus Premier DES to the M RCA  SUBJECTIVE: Has had a headache all night. She gets headaches when her blood pressure is running higher than normal which she feels is the case. No chest pain, no shortness of breath  OBJECTIVE Filed Vitals:   09/14/14 2006 09/15/14 0011 09/15/14 0222 09/15/14 0551  BP: 189/97 178/103 165/88 148/91  Pulse: 93 96  93  Temp: 98.5 F (36.9 C) 97.9 F (36.6 C)  98.1 F (36.7 C)  TempSrc: Oral Oral  Oral  Resp: 16 18  20   Height:      Weight:  150 lb 2.1 oz (68.1 kg)    SpO2: 100% 96%  98%    Intake/Output Summary (Last 24 hours) at 09/15/14 0815 Last data filed at 09/14/14 1900  Gross per 24 hour  Intake 993.75 ml  Output   1075 ml  Net -81.25 ml   Filed Weights   09/13/14 0630 09/14/14 0400 09/15/14 0011  Weight: 147 lb (66.679 kg) 149 lb 14.4 oz (67.994 kg) 150 lb 2.1 oz (68.1 kg)    PHYSICAL EXAM General: Well developed, well nourished, female in no acute distress. Head: Normocephalic, atraumatic.  Neck: Supple without bruits, JVD not elevated. Lungs:  Resp regular and unlabored, CTA. Heart: RRR, S1, S2, no S3, S4, soft murmur; no rub. Abdomen: Soft, non-tender, non-distended, BS + x 4.  Extremities: No clubbing, cyanosis, no edema. Right radial cath site without ecchymosis or hematoma Neuro: Alert and oriented X 3. Moves all extremities spontaneously. Psych: Normal affect.  LABS: CBC:  Recent Labs  09/13/14 0855 09/15/14 0329  WBC 10.2 10.1  NEUTROABS 5.8   --   HGB 12.6 12.0  HCT 35.3* 35.9*  MCV 88.9 88.0  PLT 300 282   INR:  Recent Labs  09/13/14 0855  INR 8.41   Basic Metabolic Panel:  Recent Labs  09/14/14 0434 09/15/14 0329  NA 140 139  K 4.5 4.1  CL 107 105  CO2 17* 20  GLUCOSE 98 88  BUN 14 11  CREATININE 0.98 0.91  CALCIUM 8.7 9.3   Liver Function Tests:  Recent Labs  09/13/14 0855 09/14/14 0434  AST 19 16  ALT 16 11  ALKPHOS 56 48  BILITOT 0.4 0.3  PROT 6.8 6.0  ALBUMIN 3.6 3.0*   Cardiac Enzymes:  Recent Labs  09/13/14 0855 09/13/14 1358 09/13/14 1906  TROPONINI <0.30 <0.30 <0.30    Recent Labs  09/13/14 0243  TROPIPOC 0.01   Fasting Lipid Panel: Lab Results  Component Value Date   CHOL 260* 09/07/2014   HDL 40 09/07/2014   LDLCALC 199* 09/07/2014   TRIG 105 09/07/2014   CHOLHDL 6.5 09/07/2014    TELE: Sinus rhythm, sinus tachycardia, rare PVCs       Cardiac cath results  Procedure Performed:  1. Left Heart Catheterization 2. Selective Coronary Angiography 3. Left ventricular angiogram 4. PTCA/DES x 1 mid RCA  Radiology/Studies: No results found.   Current Medications:  . aspirin  81 mg Oral Daily  . atorvastatin  80 mg Oral q1800  . cefTRIAXone (ROCEPHIN)  IV  1 g Intravenous Q24H  . clopidogrel  75 mg Oral Q breakfast  . fluconazole  100 mg Oral Daily  . hydrALAZINE  25 mg Oral TID  . metoprolol tartrate  12.5 mg Oral BID  . miconazole  100 mg Vaginal QHS  . nicotine  21 mg Transdermal Daily  . sodium bicarbonate  650 mg Oral BID  . sodium chloride  3 mL Intravenous Q12H      ASSESSMENT AND PLAN: Principal Problem:   Unstable angina - s/p DES to the RCA, minimal disease otherwise. She is on high-dose statin, aspirin, Plavix and beta blocker.  Active Problems:   Essential hypertension - Will increase the beta blocker and restart her Dyazide.    AKI (acute kidney injury) - BUN/creatinine are stable post cath, per IM    HLD (hyperlipidemia) - on high-dose  statin, LFTs okay, per IM    Tobacco abuse - cessation encouraged, per IM    Otitis - per IM    UTI (urinary tract infection): Probable - per IM    Candida infection of genital region - per IM  Plan: Have ordered when necessary IV hydralazine for improved blood pressure control. We'll give it one time dose of Vicodin for her headache. She may also be having problems with nicotine/caffeine withdrawal. No further cardiac workup planned, discharge per IM medically stable.  Signed, [name] , PA-C 8:15 AM 09/15/2014   I have personally seen and examined this patient with [name], PA-C. I agree with the assessment and plan as outlined above. She is stable post PCI and stenting of the RCA. Will need ASA and Plavix for one year. BP still elevated. Resume Dyazide and titrate metoprolol. OK to discharge today from cardiac standpoint. She can follow up with Dr. Debara Pickett or [facility] office APP in 2-3 weeks.   Myisha Pickerel 09/15/2014 8:28 AM

## 2014-09-15 NOTE — Care Management Note (Signed)
    Page 1 of 1   09/15/2014     11:44:57 AM CARE MANAGEMENT NOTE 09/15/2014  Patient:  Anna Henson, Anna Henson   Account Number:  [id number]  Date Initiated:  09/15/2014  Documentation initiated by:  Ricky Gallery  Subjective/Objective Assessment:   Pt s/p PTCA of RCA on 09/14/14.  PTA, pt independent of ADLS.     Action/Plan:   Pt has insurance, but no PCP currently.  She states she goes to urgent care.  Pt given info for [facility].  She states she will call ASAP to get appt within one week.   Anticipated DC Date:  09/15/2014   Anticipated DC Plan:  [place]  CM consult  PCP issues      Choice offered to / List presented to:             Status of service:  Completed, signed off Medicare Important Message given?   (If response is "NO", the following Medicare IM given date fields will be blank) Date Medicare IM given:   Medicare IM given by:   Date Additional Medicare IM given:   Additional Medicare IM given by:    Discharge Disposition:  HOME/SELF CARE  Per UR Regulation:  Reviewed for med. necessity/level of care/duration of stay  If discussed at [place] of Stay Meetings, dates discussed:    Comments:

## 2014-09-15 NOTE — Discharge Instructions (Signed)
PLEASE REMEMBER TO BRING ALL OF YOUR MEDICATIONS TO EACH OF YOUR FOLLOW-UP OFFICE VISITS. ° °PLEASE ATTEND ALL SCHEDULED FOLLOW-UP APPOINTMENTS.  ° °Activity: Increase activity slowly as tolerated. You may shower, but no soaking baths (or swimming) for 1 week. No driving for 2 days. No lifting over 5 lbs for 1 week. No sexual activity for 1 week.  ° °You May Return to Work: in 1 week (if applicable) ° °Wound Care: You may wash cath site gently with soap and water. Keep cath site clean and dry. If you notice pain, swelling, bleeding or pus at your cath site, please call [phone]. ° ° ° °Cardiac Cath Site Care °Refer to this sheet in the next few weeks. These instructions provide you with information on caring for yourself after your procedure. Your caregiver may also give you more specific instructions. Your treatment has been planned according to current medical practices, but problems sometimes occur. Call your caregiver if you have any problems or questions after your procedure. °HOME CARE INSTRUCTIONS °· You may shower 24 hours after the procedure. Remove the bandage (dressing) and gently wash the site with plain soap and water. Gently pat the site dry.  °· Do not apply powder or lotion to the site.  °· Do not sit in a bathtub, swimming pool, or whirlpool for 5 to 7 days.  °· No bending, squatting, or lifting anything over 10 pounds (4.5 kg) as directed by your caregiver.  °· Inspect the site at least twice daily.  °· Do not drive home if you are discharged the same day of the procedure. Have someone else drive you.  °· You may drive 24 hours after the procedure unless otherwise instructed by your caregiver.  °What to expect: °· Any bruising will usually fade within 1 to 2 weeks.  °· Blood that collects in the tissue (hematoma) may be painful to the touch. It should usually decrease in size and tenderness within 1 to 2 weeks.  °SEEK IMMEDIATE MEDICAL CARE IF: °· You have unusual pain at the site or down the  affected limb.  °· You have redness, warmth, swelling, or pain at the site.  °· You have drainage (other than a small amount of blood on the dressing).  °· You have chills.  °· You have a fever or persistent symptoms for more than 72 hours.  °· You have a fever and your symptoms suddenly get worse.  °· Your leg becomes pale, cool, tingly, or numb.  °· You have heavy bleeding from the site. Hold pressure on the site.  °Document Released: 10/28/2010 Document Revised: 09/14/2011 Document Reviewed: 10/28/2010 °Exit[employer]® Patient Information ©2012 [employer]. ° °

## 2014-09-16 ENCOUNTER — Encounter: Payer: Self-pay | Admitting: Family Medicine

## 2014-09-16 LAB — BASIC METABOLIC PANEL
Anion gap: 15 (ref 5–15)
BUN: 16 mg/dL (ref 6–23)
CO2: 21 mEq/L (ref 19–32)
Calcium: 9.9 mg/dL (ref 8.4–10.5)
Chloride: 98 mEq/L (ref 96–112)
Creatinine, Ser: 1.05 mg/dL (ref 0.50–1.10)
GFR calc Af Amer: 75 mL/min — ABNORMAL LOW (ref >90)
GFR calc non Af Amer: 65 mL/min — ABNORMAL LOW (ref >90)
Glucose, Bld: 106 mg/dL — ABNORMAL HIGH (ref 70–99)
Potassium: 4.4 mEq/L (ref 3.7–5.3)
Sodium: 134 mEq/L — ABNORMAL LOW (ref 137–147)

## 2014-09-16 MED ORDER — ASPIRIN 81 MG PO CHEW: 81.0000 mg | CHEWABLE_TABLET | Freq: Every day | ORAL | Status: AC

## 2014-09-16 MED ORDER — MICONAZOLE NITRATE 100 MG VA SUPP: 100.0000 mg | Freq: Every day | VAGINAL | Status: DC

## 2014-09-16 MED ORDER — ATORVASTATIN CALCIUM 80 MG PO TABS: 80.0000 mg | ORAL_TABLET | Freq: Every day | ORAL | Status: DC

## 2014-09-16 MED ORDER — METOPROLOL TARTRATE 50 MG PO TABS: 50.0000 mg | ORAL_TABLET | Freq: Two times a day (BID) | ORAL | Status: DC

## 2014-09-16 MED ORDER — NICOTINE 21 MG/24HR TD PT24: 21.0000 mg | MEDICATED_PATCH | Freq: Every day | TRANSDERMAL | Status: DC

## 2014-09-16 MED ORDER — CLOPIDOGREL BISULFATE 75 MG PO TABS: 75.0000 mg | ORAL_TABLET | Freq: Every day | ORAL | Status: DC

## 2014-09-16 NOTE — Progress Notes (Signed)
CARDIAC REHAB PHASE I   PRE:  Rate/Rhythm: 85 SR    BP: sitting 107/78    SaO2:   MODE:  Ambulation: 1000 ft   POST:  Rate/Rhythm: 98 SR    BP: sitting 135/87     SaO2:   Tolerated very well. All questions answered, pt ready to make change.   [phone]  Darrick Meigs CES, ACSM 09/16/2014 10:18 AM

## 2014-09-16 NOTE — Discharge Summary (Addendum)
Physician Discharge Summary  Anna Henson KVQ:[id number] DOB: Jul 22, 1971 DOA: 09/13/2014  PCP: No primary care provider on file.  Admit date: 09/13/2014 Discharge date: 09/16/2014  Time spent: 35 minutes  Recommendations for Outpatient Follow-up:  1. Recommend basic metabolic panel as well as CBC within 1 week 2. Consideration should be given for sleep study as per Dr. Gus Puma note from 11 30 3. Recommend follow-up of urine cultures performed on 09/14/14-she should also complete antifungal treatment which has been started 4. Recommend complete smoking cessation 5. Recommend aspirin tablet a and reevaluation of blood pressure is I M care physician 6. Cardiology should see patient within 1 to 2 months 7. No home health needs identified  Discharge Diagnoses:  Principal Problem:   Unstable angina Active Problems:   Essential hypertension   AKI (acute kidney injury)   HLD (hyperlipidemia)   Tobacco abuse   Otitis   UTI (urinary tract infection): Probable   Candida infection of genital region   CAD (coronary artery disease), native coronary artery   Headache   Discharge Condition: Fair   Diet recommendation: Heart healthy low-salt  Filed Weights   09/14/14 0400 09/15/14 0011 09/16/14 0100  Weight: 67.994 kg (149 lb 14.4 oz) 68.1 kg (150 lb 2.1 oz) 64.8 kg (142 lb 13.7 oz)    History of present illness:  43 year old Female h of hypertension, dyslipidemia, chronic tobacco use, chronic kidney disease stage III admitted with hypertensive urgency 09/13/14 and 6/10 chest pain EKG showed lateral T-wave inversions on admission and cardiology was consulted. It was felt she had unstable angina and she was placed on nitro drip. Subsequently patient was seen cardiology once again and had cardiac catheterization with 2.25X 12 mm Promus remainder DES to the M RCA on 12/7/.  EF noted 65- 70% Patient was placed on multiple blood pressure medications and was noted on admission she was compliant with  only one of the multiple medications she been prescribed Patient had an intractable headache 09/15/14 attributable to high blood sugars which caused medications to be adjusted as per below She also was thought to have had potentially a urine I tract infection and was prescribed IV Rocephin to complete on day of discharge 09/16/14.  Continue nystatin until seen by Lipid panel showed LDL 134 an and her goal is less than 70-statin prescribed that has possible dose of Lipitor as below   patient prescribed nicotine patch  Consultations:   Cardiology  Discharge Exam: Filed Vitals:   09/16/14 0325  BP: 121/72  Pulse: 85  Temp: 97.9 F (36.6 C)  Resp: 20    alert pleasant oriented no apparent distress General:  EOMI NCAT Cardiovascular:  S1-S2/6 left lower sternal edge Respiratory:  Clinically clear  Discharge Instructions You were cared for by a hospitalist during your hospital stay. If you have any questions about your discharge medications or the care you received while you were in the hospital after you are discharged, you can call the unit and asked to speak with the hospitalist on call if the hospitalist that took care of you is not available. Once you are discharged, your primary care physician will handle any further medical issues. Please note that NO REFILLS for any discharge medications will be authorized once you are discharged, as it is imperative that you return to your primary care physician (or establish a relationship with a primary care physician if you do not have one) for your aftercare needs so that they can reassess your need for medications and monitor  your lab values.  Discharge Instructions    Diet - low sodium heart healthy    Complete by:  As directed      Discharge instructions    Complete by:  As directed   Numerous medication changes have been made.  Please look carefully at the d/c med list Would consider closely following up with primary care physician within the  next week. Would recommend getting a basic metabolic panel at that time Please continue aspirin and Plavix indefinitely until instructed separately by cardiology. You should see cardiology within 1-2 months and we will leave that physician's name for a Congratulations on smoking cessation. It is imperative that you completely cease and desist from doing that     Increase activity slowly    Complete by:  As directed           Current Discharge Medication List    START taking these medications   Details  aspirin 81 MG chewable tablet Chew 1 tablet (81 mg total) by mouth daily.    atorvastatin (LIPITOR) 80 MG tablet Take 1 tablet (80 mg total) by mouth daily at 6 PM. Qty: 30 tablet, Refills: 0    clopidogrel (PLAVIX) 75 MG tablet Take 1 tablet (75 mg total) by mouth daily with breakfast. Qty: 30 tablet, Refills: 12    metoprolol (LOPRESSOR) 50 MG tablet Take 1 tablet (50 mg total) by mouth 2 (two) times daily. Qty: 60 tablet, Refills: 0    miconazole (MICOTIN) 100 MG vaginal suppository Place 1 suppository (100 mg total) vaginally at bedtime. Qty: 7 suppository, Refills: 0    nicotine (NICODERM CQ - DOSED IN MG/24 HOURS) 21 mg/24hr patch Place 1 patch (21 mg total) onto the skin daily. Qty: 28 patch, Refills: 0      CONTINUE these medications which have NOT CHANGED   Details  diltiazem (DILACOR XR) 240 MG 24 hr capsule Take 1 capsule (240 mg total) by mouth daily. Qty: 30 capsule, Refills: 5   Associated Diagnoses: HTN (hypertension), malignant    hydrALAZINE (APRESOLINE) 25 MG tablet Take 1 tablet (25 mg total) by mouth 3 (three) times daily. Qty: 90 tablet, Refills: 5   Associated Diagnoses: HTN (hypertension), malignant    triamterene-hydrochlorothiazide (DYAZIDE) 37.5-25 MG per capsule Take 1 each (1 capsule total) by mouth daily. Qty: 30 capsule, Refills: 5   Associated Diagnoses: HTN (hypertension), malignant      STOP taking these medications     amoxicillin  (AMOXIL) 875 MG tablet        No Known Allergies Follow-up Information    Follow up with Pixie Casino, MD.   Specialty:  Cardiology   Why:  The office will call.   Contact information:   [place] [address] [phone]        The results of significant diagnostics from this hospitalization (including imaging, microbiology, ancillary and laboratory) are listed below for reference.    Significant Diagnostic Studies: Dg Chest 2 View  09/13/2014   CLINICAL DATA:  Initial valuation for acute chest pain.  EXAM: CHEST  2 VIEW  COMPARISON:  None.  FINDINGS: The cardiac and mediastinal silhouettes are within normal limits.  The lungs are normally inflated. No airspace consolidation, pleural effusion, or pulmonary edema is identified. There is no pneumothorax.  No acute osseous abnormality identified.  IMPRESSION: No active cardiopulmonary disease.   Electronically Signed   By: Jeannine Boga M.D.   On: 09/13/2014 02:30   Ct Angio Chest  Aortic Dissect W &/or W/o  09/13/2014   CLINICAL DATA:  Acute onset of mid chest pain for 1 week. Intermittent tachycardia. Elevated blood pressure. Initial encounter.  EXAM: CT ANGIOGRAPHY CHEST WITH CONTRAST  TECHNIQUE: Multidetector CT imaging of the chest was performed using the standard protocol during bolus administration of intravenous contrast. Multiplanar CT image reconstructions and MIPs were obtained to evaluate the vascular anatomy.  CONTRAST:  147mL OMNIPAQUE IOHEXOL 350 MG/ML SOLN  COMPARISON:  Chest radiograph performed earlier today at 2:03 a.m.  FINDINGS: There is no evidence of aortic dissection. No aneurysmal dilatation is seen. No calcific atherosclerotic disease is identified.  There is no evidence of pulmonary embolus.  The lungs are clear bilaterally. A small lymph node is noted along the left major fissure. There is no evidence of significant focal consolidation, pleural effusion or pneumothorax. No masses  are identified; no abnormal focal contrast enhancement is seen.  The mediastinum is unremarkable in appearance. No mediastinal lymphadenopathy is seen. No pericardial effusion is identified. The great vessels are grossly unremarkable in appearance. No axillary lymphadenopathy is seen. The visualized portions of the thyroid gland are unremarkable in appearance.  The visualized portions of the liver and spleen are unremarkable. The visualized portions of the pancreas, gallbladder, stomach, adrenal glands and kidneys are within normal limits.  No acute osseous abnormalities are seen.  Review of the MIP images confirms the above findings.  IMPRESSION: 1. No evidence of aortic dissection. No aneurysmal dilatation seen. No calcific atherosclerotic disease identified. 2. No evidence of pulmonary embolus. 3. Lungs clear bilaterally.   Electronically Signed   By: Garald Balding M.D.   On: 09/13/2014 06:15    Microbiology: Recent Results (from the past 240 hour(s))  MRSA PCR Screening     Status: None   Collection Time: 09/13/14  7:35 AM  Result Value Ref Range Status   MRSA by PCR NEGATIVE NEGATIVE Final    Comment:        The GeneXpert MRSA Assay (FDA approved for NASAL specimens only), is one component of a comprehensive MRSA colonization surveillance program. It is not intended to diagnose MRSA infection nor to guide or monitor treatment for MRSA infections.   Culture, Urine     Status: None (Preliminary result)   Collection Time: 09/14/14  6:50 AM  Result Value Ref Range Status   Specimen Description URINE, RANDOM  Final   Special Requests ADDED [id number]  Final   Culture  Setup Time   Final    09/15/2014 05:24 Performed at [facility]   Final    >=100,000 COLONIES/ML Performed at [facility]    Culture   Final    ESCHERICHIA COLI Performed at [facility]    Report Status PENDING  Incomplete     Labs: Basic Metabolic Panel:  Recent  Labs Lab 09/13/14 0244 09/13/14 0855 09/14/14 0434 09/15/14 0329 09/16/14 0322  NA 137 135* 140 139 134*  K 4.1 4.2 4.5 4.1 4.4  CL 102 102 107 105 98  CO2  --  20 17* 20 21  GLUCOSE 97 87 98 88 106*  BUN 18 14 14 11 16   CREATININE 1.20* 1.03 0.98 0.91 1.05  CALCIUM  --  9.0 8.7 9.3 9.9   Liver Function Tests:  Recent Labs Lab 09/13/14 0855 09/14/14 0434 09/15/14 0329  AST 19 16 22   ALT 16 11 12   ALKPHOS 56 48 50  BILITOT 0.4 0.3 0.4  PROT 6.8 6.0 6.7  ALBUMIN 3.6 3.0* 3.4*   No results for input(s): LIPASE, AMYLASE in the last 168 hours. No results for input(s): AMMONIA in the last 168 hours. CBC:  Recent Labs Lab 09/09/14 1220 09/13/14 0244 09/13/14 0855 09/15/14 0329  WBC 14.9*  --  10.2 10.1  NEUTROABS  --   --  5.8  --   HGB 15.1 14.6 12.6 12.0  HCT 46.0 43.0 35.3* 35.9*  MCV 93.0  --  88.9 88.0  PLT  --   --  300 282   Cardiac Enzymes:  Recent Labs Lab 09/13/14 0855 09/13/14 1358 09/13/14 1906  TROPONINI <0.30 <0.30 <0.30   BNP: BNP (last 3 results) No results for input(s): PROBNP in the last 8760 hours. CBG: No results for input(s): GLUCAP in the last 168 hours.     SignedNita Sells  [facility] 09/16/2014, 8:21 AM

## 2014-09-17 ENCOUNTER — Encounter (HOSPITAL_COMMUNITY): Payer: Self-pay | Admitting: Cardiovascular Disease

## 2014-09-17 LAB — URINE CULTURE: Colony Count: >=100000

## 2014-09-26 ENCOUNTER — Ambulatory Visit (INDEPENDENT_AMBULATORY_CARE_PROVIDER_SITE_OTHER): Payer: Federal, State, Local not specified - PPO | Admitting: Family Medicine

## 2014-09-26 VITALS — BP 98/58 | HR 61 | Temp 98.3°F | Resp 18 | Wt 147.0 lb

## 2014-09-26 DIAGNOSIS — R5383 Other fatigue: Secondary | ICD-10-CM

## 2014-09-26 DIAGNOSIS — R590 Localized enlarged lymph nodes: Secondary | ICD-10-CM

## 2014-09-26 DIAGNOSIS — I25110 Atherosclerotic heart disease of native coronary artery with unstable angina pectoris: Secondary | ICD-10-CM

## 2014-09-26 DIAGNOSIS — N3000 Acute cystitis without hematuria: Secondary | ICD-10-CM

## 2014-09-26 DIAGNOSIS — I10 Essential (primary) hypertension: Secondary | ICD-10-CM

## 2014-09-26 DIAGNOSIS — E785 Hyperlipidemia, unspecified: Secondary | ICD-10-CM

## 2014-09-26 DIAGNOSIS — Z720 Tobacco use: Secondary | ICD-10-CM

## 2014-09-26 LAB — POCT URINALYSIS DIPSTICK
Bilirubin, UA: NEGATIVE
Blood, UA: NEGATIVE
Glucose, UA: NEGATIVE
Ketones, UA: NEGATIVE
Leukocytes, UA: NEGATIVE
Nitrite, UA: NEGATIVE
Protein, UA: NEGATIVE
Spec Grav, UA: 1.02
Urobilinogen, UA: 0.2
pH, UA: 5.5

## 2014-09-26 LAB — CBC
HCT: 36.3 % (ref 36.0–46.0)
Hemoglobin: 12.4 g/dL (ref 12.0–15.0)
MCH: 30.5 pg (ref 26.0–34.0)
MCHC: 34.2 g/dL (ref 30.0–36.0)
MCV: 89.2 fL (ref 78.0–100.0)
MPV: 9.2 fL — ABNORMAL LOW (ref 9.4–12.4)
Platelets: 500 10*3/uL — ABNORMAL HIGH (ref 150–400)
RBC: 4.07 MIL/uL (ref 3.87–5.11)
RDW: 14.3 % (ref 11.5–15.5)
WBC: 12 10*3/uL — ABNORMAL HIGH (ref 4.0–10.5)

## 2014-09-26 LAB — POCT UA - MICROSCOPIC ONLY
Casts, Ur, LPF, POC: NEGATIVE
Crystals, Ur, HPF, POC: NEGATIVE
Mucus, UA: NEGATIVE
Yeast, UA: NEGATIVE

## 2014-09-26 LAB — TSH: TSH: 0.452 u[IU]/mL (ref 0.350–4.500)

## 2014-09-26 NOTE — Progress Notes (Signed)
Subjective: 43 year old lady who was here couple of weeks ago. Since that time she was having a little chest discomfort and went into the emergency room. She has pain also in the right scapular area. She was found to have a significant coronary blockage and had coronary stenting done. She continues to be a little fatigued. No chest pains. She did have a urinary tract infections when she was in the hospital, received IV antibiotics when she was in the hospital but I do not believe that she was sent home on any. She was sent home on lipid-lowering medications, antiplatelet medication, and blood pressure pills. She is wearing the patch is working on stopping smoking. Has had some tender nodes in her neck    Objective: Blood pressure is noted. Her chest is clear. Heart regular without murmurs. Chest small nodes in her neck. She has these places in her right wrist that they did the puncture for the catheterization. Neck supple with small lymphadenopathy. The thyroid is a little border line. Probably normal we'll check TSH.  Assessment: Status post coronary stenting Fatigue Recent urinary tract infection Hypertension, now hypotensive  Plan: Urinalysis EKG Make a quit date for getting off the nicotine patches  Decrease the Lopressor and one half tablet 3 times daily  Results for orders placed or performed in visit on 09/26/14  POCT UA - Microscopic Only  Result Value Ref Range   WBC, Ur, HPF, POC 1-3    RBC, urine, microscopic 0-1    Bacteria, U Microscopic trace    Mucus, UA neg    Epithelial cells, urine per micros 2-5    Crystals, Ur, HPF, POC neg    Casts, Ur, LPF, POC neg    Yeast, UA neg   POCT urinalysis dipstick  Result Value Ref Range   Color, UA yellow    Clarity, UA clear    Glucose, UA neg    Bilirubin, UA neg    Ketones, UA neg    Spec Grav, UA 1.020    Blood, UA neg    pH, UA 5.5    Protein, UA neg    Urobilinogen, UA 0.2    Nitrite, UA neg    Leukocytes, UA  Negative

## 2014-09-26 NOTE — Patient Instructions (Addendum)
Decrease Apresoline (hydralazine) to one half tablet 3 times daily.  Monitor blood pressure.  If it remains <594 systolic, consider discontinuing the apresoline.    Return in 1 month for recheck  Urinary tract infection appears resolved. Return if further symptoms.

## 2014-09-29 ENCOUNTER — Ambulatory Visit (INDEPENDENT_AMBULATORY_CARE_PROVIDER_SITE_OTHER): Payer: Federal, State, Local not specified - PPO | Admitting: Neurology

## 2014-09-29 ENCOUNTER — Encounter: Payer: Self-pay | Admitting: Neurology

## 2014-09-29 VITALS — Temp 98.3°F | Ht 61.5 in | Wt 147.0 lb

## 2014-09-29 DIAGNOSIS — R519 Headache, unspecified: Secondary | ICD-10-CM

## 2014-09-29 DIAGNOSIS — E663 Overweight: Secondary | ICD-10-CM

## 2014-09-29 DIAGNOSIS — I2510 Atherosclerotic heart disease of native coronary artery without angina pectoris: Secondary | ICD-10-CM

## 2014-09-29 DIAGNOSIS — R51 Headache: Secondary | ICD-10-CM

## 2014-09-29 DIAGNOSIS — R0683 Snoring: Secondary | ICD-10-CM

## 2014-09-29 DIAGNOSIS — R351 Nocturia: Secondary | ICD-10-CM

## 2014-09-29 MED ORDER — AZITHROMYCIN 250 MG PO TABS: ORAL_TABLET | ORAL | Status: DC

## 2014-09-29 NOTE — Addendum Note (Signed)
Addended by: Constance Goltz on: 09/29/2014 05:14 PM   Modules accepted: Orders, SmartSet

## 2014-09-29 NOTE — Progress Notes (Signed)
Subjective:    Patient ID: Anna Henson is a 43 y.o. female.  HPI     Star Age, MD, PhD [facility] [address] [place], [place] [address]  Dear Dr. Marin Comment,   Saw your patient, Anna Henson, upon your kind request in my neurologic clinic today for initial consultation of her sleep disorder, in particular, concern for underlying obstructive sleep apnea. The patient is unaccompanied today. As you know, Mr. Milson is a 43 year old right-handed woman with an underlying medical history of hypertension, hyperlipidemia, chronic kidney disease, heart disease with recent angioplasty with stenting on 09/14/14, who reports snoring, EDS and morning headaches as well as post-coital headaches. She snores at times loud, but does not know if there are pauses in her breathing. She has nocturia 2 times per night. She has woken herself up with her snoring. She has a FHx of OSA in her mother and cousins. She quit smoking on 09/09/14. She goes to bed around 10 PM. She has to get up at 4 or 4:30 for work. She does not always wake up rested. She drinks coffee once in the morning or may be a second cup in the late morning. There is a TV in the bedroom and usually it is on a timer at night. She lives alone and has no children. She has no pets. Her boyfriend has noticed her loud snoring. She had a tonsillectomy some 10 years ago for recurrent tonsillitis. She denies restless leg symptoms and is not sure if she kicks in her sleep but she does endorse being a restless sleeper. Her Epworth sleepiness score is 10 out of 24 today.  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   Her Past Medical History Is Significant For: Past Medical History  Diagnosis Date  . Hypertension     > 5 years  . Chronic kidney disease     she sthinks has stage 2 CKD, has had US doppler and has some stenosis , this was done in [place]   . Hyperlipidemia   . ACS (acute  coronary syndrome) 09/13/2014    Unstable anginal pain s/p 2.25 x 12 mm Promus Premier DES to the RCA    Her Past Surgical History Is Significant For: Past Surgical History  Procedure Laterality Date  . Hernia repair    . Coronary angioplasty with stent placement      L main OK, LAD OK, CFX 30%, RCA 90%-0% w/ 2.25 x 12 mm Promus Premier DES, EF 55%  . Left heart catheterization with coronary angiogram N/A 09/14/2014    Procedure: LEFT HEART CATHETERIZATION WITH CORONARY ANGIOGRAM;  Surgeon: Burnell Blanks, MD;  Location: [facility] CATH LAB;  Service: Cardiovascular;  Laterality: N/A;  . Percutaneous coronary stent intervention (pci-s)  09/14/2014    Procedure: PERCUTANEOUS CORONARY STENT INTERVENTION (PCI-S);  Surgeon: Burnell Blanks, MD;  Location[facility] C[facility] LAB;  Service: Cardiovascular;;  . Tonsillectomy  2005    10 years ago    Her Family History Is Significant For: Family History  Problem Relation Age of Onset  . Diabetes Mother   . Heart disease Mother   . Hyperlipidemia Mother   . Hypertension Mother   . Lung cancer Father     Her Social History Is Significant For: History   Social History  . Marital Status: Single    Spouse Name: N/A    Number of Children: 0  . Years of Education: masters  Occupational History  .      [employer]   Social History Main Topics  . Smoking status: Former Smoker -- 0.50 packs/day    Types: Cigarettes  . Smokeless tobacco: Never Used     Comment: on patch since 09/09/14  . Alcohol Use: No  . Drug Use: No  . Sexual Activity: None   Other Topics Concern  . None   Social History Narrative   Consumes 2 cups of caffeine daily    Her Allergies Are:  No Known Allergies:   Her Current Medications Are:  Outpatient Encounter Prescriptions as of 09/29/2014  Medication Sig  . aspirin 81 MG chewable tablet Chew 1 tablet (81 mg total) by mouth daily.  Marland Kitchen atorvastatin (LIPITOR) 80 MG tablet Take 1 tablet (80 mg total) by mouth  daily at 6 PM.  . clopidogrel (PLAVIX) 75 MG tablet Take 1 tablet (75 mg total) by mouth daily with breakfast.  . diltiazem (DILACOR XR) 240 MG 24 hr capsule Take 1 capsule (240 mg total) by mouth daily.  . hydrALAZINE (APRESOLINE) 25 MG tablet Take 1 tablet (25 mg total) by mouth 3 (three) times daily. (Patient taking differently: Take 25 mg by mouth once. One pill daily)  . metoprolol (LOPRESSOR) 50 MG tablet Take 1 tablet (50 mg total) by mouth 2 (two) times daily.  . miconazole (MICOTIN) 100 MG vaginal suppository Place 1 suppository (100 mg total) vaginally at bedtime.  . nicotine (NICODERM CQ - DOSED IN MG/24 HOURS) 21 mg/24hr patch Place 1 patch (21 mg total) onto the skin daily.  Marland Kitchen triamterene-hydrochlorothiazide (DYAZIDE) 37.5-25 MG per capsule Take 1 each (1 capsule total) by mouth daily.  :  Review of Systems:  Out of a complete 14 point review of systems, all are reviewed and negative with the exception of these symptoms as listed below:   Review of Systems  Constitutional: Positive for fatigue.  Eyes: Positive for pain.  Cardiovascular:       Murmur  Neurological: Positive for headaches.       Snoring,sleepiness  Psychiatric/Behavioral:       Decreased energy    Objective:  Neurologic Exam  Physical Exam Physical Examination:   Filed Vitals:   09/29/14 1430  Temp: 98.3 F (36.8 C)    General Examination: The patient is a very pleasant 43 y.o. female in no acute distress. She appears well-developed and well-nourished and well groomed.   HEENT: Normocephalic, atraumatic, pupils are equal, round and reactive to light and accommodation. Funduscopic exam is normal with sharp disc margins noted. Extraocular tracking is good without limitation to gaze excursion or nystagmus noted. Normal smooth pursuit is noted. Hearing is grossly intact. Tympanic membranes are clear bilaterally. Face is symmetric with normal facial animation and normal facial sensation. Speech is clear  with no dysarthria noted. There is no hypophonia. There is no lip, neck/head, jaw or voice tremor. Neck is supple with full range of passive and active motion. There are no carotid bruits on auscultation. Oropharynx exam reveals: mild mouth dryness, adequate dental hygiene and mild to moderate airway crowding, due to redundant soft palate and longer tongue. Mallampati is class II. Tongue protrudes centrally and palate elevates symmetrically. Tonsils are absent. Neck size is 14.5 inches. She has a Mild overbite. Nasal inspection reveals no significant nasal mucosal bogginess or redness and no septal deviation.   Chest: Clear to auscultation without wheezing, rhonchi or crackles noted.  Heart: S1+S2+0, regular and normal without murmurs, rubs or  gallops noted.   Abdomen: Soft, non-tender and non-distended with normal bowel sounds appreciated on auscultation.  Extremities: There is no pitting edema in the distal lower extremities bilaterally. Pedal pulses are intact.  Skin: Warm and dry without trophic changes noted. There are no varicose veins.  Musculoskeletal: exam reveals no obvious joint deformities, tenderness or joint swelling or erythema.   Neurologically:  Mental status: The patient is awake, alert and oriented in all 4 spheres. Her immediate and remote memory, attention, language skills and fund of knowledge are appropriate. There is no evidence of aphasia, agnosia, apraxia or anomia. Speech is clear with normal prosody and enunciation. Thought process is linear. Mood is normal and affect is normal.  Cranial nerves II - XII are as described above under HEENT exam. In addition: shoulder shrug is normal with equal shoulder height noted. Motor exam: Normal bulk, strength and tone is noted. There is no drift, tremor or rebound. Romberg is negative. Reflexes are 2+ throughout. Babinski: Toes are flexor bilaterally. Fine motor skills and coordination: intact with normal finger taps, normal hand  movements, normal rapid alternating patting, normal foot taps and normal foot agility.  Cerebellar testing: No dysmetria or intention tremor on finger to nose testing. Heel to shin is unremarkable bilaterally. There is no truncal or gait ataxia.  Sensory exam: intact to light touch, pinprick, vibration, temperature sense in the upper and lower extremities.  Gait, station and balance: She stands easily. No veering to one side is noted. No leaning to one side is noted. Posture is age-appropriate and stance is narrow based. Gait shows normal stride length and normal pace. No problems turning are noted. She turns en bloc. Tandem walk is unremarkable. Intact toe and heel stance is noted.               Assessment and Plan:   In summary, Anna Henson is a very pleasant 43 y.o.-year old female with an underlying medical history of hypertension, hyperlipidemia, chronic kidney disease, heart disease with recent angioplasty with stenting on 09/14/14, who reports snoring, EDS and morning headaches, nocturia and family history of obstructive sleep apnea. Her history and physical exam are concerning for underlying obstructive sleep apnea (OSA). I had a long chat with the patient about my findings and the diagnosis of OSA, its prognosis and treatment options. We talked about medical treatments, surgical interventions and non-pharmacological approaches. I explained in particular the risks and ramifications of untreated moderate to severe OSA, especially with respect to developing cardiovascular disease down the Road, including congestive heart failure, difficult to treat hypertension, cardiac arrhythmias, or stroke. Even type 2 diabetes has, in part, been linked to untreated OSA. Symptoms of untreated OSA include daytime sleepiness, memory problems, mood irritability and mood disorder such as depression and anxiety, lack of energy, as well as recurrent headaches, especially morning headaches. We talked about trying to  maintain a healthy lifestyle in general, as well as the importance of weight control. I encouraged the patient to eat healthy, exercise daily and keep well hydrated, to keep a scheduled bedtime and wake time routine, to not skip any meals and eat healthy snacks in between meals. I advised the patient not to drive when feeling sleepy. I recommended the following at this time: sleep study with potential positive airway pressure titration. (We will score hypopneas at 3% and split the sleep study into diagnostic and treatment portion, if the estimated. 2 hour AHI is >15/h).   I explained the sleep test procedure to the  patient and also outlined possible surgical and non-surgical treatment options of OSA, including the use of a custom-made dental device (which would require a referral to a specialist dentist or oral surgeon), upper airway surgical options, such as pillar implants, radiofrequency surgery, tongue base surgery, and UPPP (which would involve a referral to an ENT surgeon). Rarely, jaw surgery such as mandibular advancement may be considered.  I also explained the CPAP treatment option to the patient, who indicated that she would be willing to try CPAP if the need arises. I explained the importance of being compliant with PAP treatment, not only for insurance purposes but primarily to improve Her symptoms, and for the patient's long term health benefit, including to reduce Her cardiovascular risks. I answered all her questions today and the patient was in agreement. I would like to see her back after the sleep study is completed and encouraged her to call with any interim questions, concerns, problems or updates.   Thank you very much for allowing me to participate in the care of this nice patient. If I can be of any further assistance to you please do not hesitate to call me at [phone].  Sincerely,   Star Age, MD, PhD

## 2014-09-29 NOTE — Patient Instructions (Signed)

## 2014-10-06 ENCOUNTER — Ambulatory Visit (INDEPENDENT_AMBULATORY_CARE_PROVIDER_SITE_OTHER): Payer: Federal, State, Local not specified - PPO | Admitting: Cardiology

## 2014-10-06 ENCOUNTER — Encounter: Payer: Self-pay | Admitting: Cardiology

## 2014-10-06 VITALS — BP 132/76 | HR 59 | Ht 61.0 in | Wt 149.2 lb

## 2014-10-06 DIAGNOSIS — I25119 Atherosclerotic heart disease of native coronary artery with unspecified angina pectoris: Secondary | ICD-10-CM

## 2014-10-06 DIAGNOSIS — I10 Essential (primary) hypertension: Secondary | ICD-10-CM

## 2014-10-06 DIAGNOSIS — Z720 Tobacco use: Secondary | ICD-10-CM

## 2014-10-06 DIAGNOSIS — E785 Hyperlipidemia, unspecified: Secondary | ICD-10-CM

## 2014-10-06 NOTE — Assessment & Plan Note (Signed)
She has stopped smoking 

## 2014-10-06 NOTE — Patient Instructions (Signed)
CONTINUE your current medications.    Call our office if your Systolic blood pressure begins to stays above 140  Your physician discussed the importance of regular exercise and recommended that you start or continue a regular exercise program for good health.  Your physician recommends that you schedule a follow-up appointment in: 6 weeks with Dr.Hilty.

## 2014-10-06 NOTE — Assessment & Plan Note (Signed)
Recent placement of a Promus drug-eluting stent to mid RCA for 90% stenosis. Other coronary arteries were normal.  EF was noted at 65-70%

## 2014-10-06 NOTE — Assessment & Plan Note (Signed)
Currently stable blood pressure off the hydralazine and the Cardizem. If her blood pressure is greater than 140-150 I would add an Ace or and aren't for better blood pressure control.

## 2014-10-06 NOTE — Progress Notes (Signed)
10/06/2014   PCP: Leotis Pain, DO   Chief Complaint  Patient presents with  . Follow-up    post Cath, having B/P issues PCP took patient off Hydralazine, Patient D/C'd Diltazem bcause she felt bad    Primary Cardiologist:Dr. Alma Friendly   HPI:  43 year old Female hx of hypertension, dyslipidemia, chronic tobacco use, chronic kidney disease stage III admitted with hypertensive urgency 09/13/14 and 6/10 chest pain EKG showed lateral T-wave inversions on admission and cardiology was consulted. It was felt she had unstable angina and she was placed on nitro drip.  Cardiac catheterization with 2.25X 12 mm Promus remainder DES to the M RCA on 09/14/14 for 90% stenosis. EF noted 65- 70%.  She did well and discharged without complication.    She was on numerous BP meds and her BP was running in the 90s to 106 and she felt very weak, lethargic.  The hydralazine was stopped and the cardizem.  She is feeling better.  She has occasional brief chest pressure feeling like the discomfort she was admitted with.  She is here today for follow-up. She is actually doing quite well her questions we discussed where the pneumonia vaccine she is only 37 and essentially in good health told her that was not needed unless she wanted it she does not want a flu vaccine she's had side effects in the past and refuses to take it. She is scheduled for sleep study January 29 2 [place] [facility]. She was recently seen by her primary care for cold symptoms and was placed on Zithromax.  She is feeling better.    She is asking about exercise which I recommended she could go back to to start slowly but work only a peripheral 1-2 weeks. She did not have a heart attack and her EF is normal.  Her mother was with her today who also has stents but the mother is or bare-metal stents. We discussed the difference between the 2 and the need for Plavix with both, and the length of time she would need to take the Plavix.  She  is no longer smoking. She is using the NicoDerm.  No Known Allergies  Current Outpatient Prescriptions  Medication Sig Dispense Refill  . aspirin 81 MG chewable tablet Chew 1 tablet (81 mg total) by mouth daily.    Marland Kitchen atorvastatin (LIPITOR) 80 MG tablet Take 1 tablet (80 mg total) by mouth daily at 6 PM. 30 tablet 0  . azithromycin (ZITHROMAX) 250 MG tablet ZPak-take as directed. 6 tablet 0  . clopidogrel (PLAVIX) 75 MG tablet Take 1 tablet (75 mg total) by mouth daily with breakfast. 30 tablet 12  . metoprolol (LOPRESSOR) 50 MG tablet Take 1 tablet (50 mg total) by mouth 2 (two) times daily. 60 tablet 0  . nicotine (NICODERM CQ - DOSED IN MG/24 HOURS) 21 mg/24hr patch Place 1 patch (21 mg total) onto the skin daily. 28 patch 0  . triamterene-hydrochlorothiazide (DYAZIDE) 37.5-25 MG per capsule Take 1 each (1 capsule total) by mouth daily. 30 capsule 5   No current facility-administered medications for this visit.    Past Medical History  Diagnosis Date  . Hypertension     > 5 years  . Chronic kidney disease     she sthinks has stage 2 CKD, has had US doppler and has some stenosis , this was done in [place]   . Hyperlipidemia   . ACS (acute coronary syndrome) 09/13/2014  Unstable anginal pain s/p 2.25 x 12 mm Promus Premier DES to the RCA  . CAD in native artery     Past Surgical History  Procedure Laterality Date  . Hernia repair    . Coronary angioplasty with stent placement      L main [place], LAD OK, CFX 30%, RCA 90%-0% w/ 2.25 x 12 mm Promus Premier DES, EF 55%  . Left heart catheterization with coronary angiogram N/A 09/14/2014    Procedure: LEFT HEART CATHETERIZATION WITH CORONARY ANGIOGRAM;  Surgeon: Burnell Blanks, MD;  Location: [facility] CATH LAB;  Service: Cardiovascular;  Laterality: N/A;  . Percutaneous coronary stent intervention (pci-s)  09/14/2014    Procedure: PERCUTANEOUS CORONARY STENT INTERVENTION (PCI-S);  Surgeon: Burnell Blanks, MD;  Location: [facility]  CATH LAB;  Service: Cardiovascular;;  . Tonsillectomy  2005    10 years ago    DXA:JOINOMV:EH colds or fevers, no weight changes, was feeling weak and tired with lower BP Skin:no rashes or ulcers HEENT:no blurred vision, no congestion CV:see HPI PUL:see HPI GI:no diarrhea constipation or melena, no indigestion GU:no hematuria, no dysuria MS:no joint pain, no claudication Neuro:no syncope, no lightheadedness Endo:no diabetes, no thyroid disease  Wt Readings from Last 3 Encounters:  10/06/14 149 lb 3.2 oz (67.677 kg)  09/29/14 147 lb (66.679 kg)  09/26/14 147 lb (66.679 kg)    PHYSICAL EXAM BP 132/76 mmHg  Pulse 59  Ht 5\' 1"  (1.549 m)  Wt 149 lb 3.2 oz (67.677 kg)  BMI 28.21 kg/m2  LMP 09/26/2014 General:Pleasant affect, NAD Skin:Warm and dry, brisk capillary refill HEENT:normocephalic, sclera clear, mucus membranes moist Neck:supple, no JVD, no bruits . No adenopathy Heart:S1S2 RRR without murmur, gallup, rub or click Lungs:clear without rales, rhonchi, or wheezes MCN:OBSJ, non tender, + BS, do not palpate liver spleen or masses Ext:no lower ext edema, 2+ pedal pulses, 2+ radial pulses Neuro:alert and oriented, MAE, follows commands, + facial symmetry  EKG: SB at 59 no acute changes actually improved from 09/10/14 with less t wave inversion in II and AVL and resolution of T wave inversions in V5-6.    ASSESSMENT AND PLAN CAD (coronary artery disease), native coronary artery Recent placement of a Promus drug-eluting stent to mid RCA for 90% stenosis. Other coronary arteries were normal.  EF was noted at 65-70%  HLD (hyperlipidemia) Now on Lipitor 80 mg daily continue this  Tobacco abuse She has stopped smoking.  Essential hypertension Currently stable blood pressure off the hydralazine and the Cardizem. If her blood pressure is greater than 140-150 I would add an Ace or and aren't for better blood pressure control.    She will follow-up with Dr. Debara Pickett in 6  weeks

## 2014-10-06 NOTE — Assessment & Plan Note (Signed)
Now on Lipitor 80 mg daily continue this

## 2014-10-10 ENCOUNTER — Telehealth: Payer: Self-pay | Admitting: Family Medicine

## 2014-10-10 NOTE — Telephone Encounter (Signed)
Patient states that she needs refills on medications that she was given while she was in the hospital. They are as follows: LIPITOR 80 MG tablet, LOPRESSOR 50 MG tablet, NICODERM CQ - DOSED IN MG/24 HOURS 21 mg/24hr patch. [place]  [phone]

## 2014-10-14 NOTE — Telephone Encounter (Signed)
Please refill each for 2 months. Return in early March

## 2014-10-14 NOTE — Telephone Encounter (Signed)
Patient called again concerning these meds. She is almost out.

## 2014-10-15 MED ORDER — METOPROLOL TARTRATE 50 MG PO TABS: 50.0000 mg | ORAL_TABLET | Freq: Two times a day (BID) | ORAL | Status: DC

## 2014-10-15 MED ORDER — NICOTINE 21 MG/24HR TD PT24: 21.0000 mg | MEDICATED_PATCH | Freq: Every day | TRANSDERMAL | Status: DC

## 2014-10-15 MED ORDER — ATORVASTATIN CALCIUM 80 MG PO TABS: 80.0000 mg | ORAL_TABLET | Freq: Every day | ORAL | Status: DC

## 2014-10-15 NOTE — Telephone Encounter (Signed)
Refill sent per instructions

## 2014-10-22 ENCOUNTER — Telehealth: Payer: Self-pay

## 2014-10-22 NOTE — Telephone Encounter (Signed)
Please advise recommendations for an alternative-

## 2014-10-22 NOTE — Telephone Encounter (Signed)
Wean off the patches.  They is not a need to continue long term.  Also patient was to return in 1 month, so can discuss when she come in soon.

## 2014-10-22 NOTE — Telephone Encounter (Signed)
Patient states her insurance will not cover the nicotine patches. Can she have something cheaper?   [phone]

## 2014-10-23 NOTE — Telephone Encounter (Signed)
LM for pt to RTC- it is time for a follow up and her concern would be addressed during this follow up.

## 2014-11-08 ENCOUNTER — Ambulatory Visit (INDEPENDENT_AMBULATORY_CARE_PROVIDER_SITE_OTHER): Payer: Federal, State, Local not specified - PPO | Admitting: Internal Medicine

## 2014-11-08 VITALS — BP 114/82 | HR 78 | Temp 97.8°F | Resp 18 | Ht 62.5 in | Wt 151.8 lb

## 2014-11-08 DIAGNOSIS — F17213 Nicotine dependence, cigarettes, with withdrawal: Secondary | ICD-10-CM

## 2014-11-08 DIAGNOSIS — E784 Other hyperlipidemia: Secondary | ICD-10-CM

## 2014-11-08 DIAGNOSIS — I10 Essential (primary) hypertension: Secondary | ICD-10-CM

## 2014-11-08 DIAGNOSIS — E785 Hyperlipidemia, unspecified: Secondary | ICD-10-CM

## 2014-11-08 DIAGNOSIS — Z955 Presence of coronary angioplasty implant and graft: Secondary | ICD-10-CM

## 2014-11-08 DIAGNOSIS — Z9861 Coronary angioplasty status: Secondary | ICD-10-CM

## 2014-11-08 MED ORDER — NICOTINE 14 MG/24HR TD PT24
14.0000 mg | MEDICATED_PATCH | Freq: Every day | TRANSDERMAL | Status: DC
Start: 2014-11-08 — End: 2015-02-23

## 2014-11-08 MED ORDER — ATORVASTATIN CALCIUM 80 MG PO TABS: 80.0000 mg | ORAL_TABLET | Freq: Every day | ORAL | Status: DC

## 2014-11-08 MED ORDER — METOPROLOL TARTRATE 50 MG PO TABS
50.0000 mg | ORAL_TABLET | Freq: Two times a day (BID) | ORAL | Status: DC
Start: 2014-11-08 — End: 2015-06-15

## 2014-11-08 MED ORDER — NICOTINE POLACRILEX 2 MG MT GUM: 2.0000 mg | CHEWING_GUM | OROMUCOSAL | Status: DC | PRN

## 2014-11-08 NOTE — Progress Notes (Addendum)
11/08/2014 at 9:48 AM  [name] / DOB: [birth year] / MRN: [id number]  The patient has Essential hypertension; Unstable angina; AKI (acute kidney injury); HLD (hyperlipidemia); Otitis; UTI (urinary tract infection): Probable; Candida infection of genital region; CAD (coronary artery disease), native coronary artery; and Headache on her problem list.  SUBJECTIVE  Chief compalaint: Medication Refill; Nicoderm CQ; Metoprolol; and Lipitor   History of present illness: [name] is 44 y.o. well appearing female presenting for 90 day refills of her Metoprolol, Atorvastatin, and nicotine patches.  She reports being compliant to all of her specialist follow up appointments.   She denies chest pain, SOB, DOE, presyncope, and diaphoresis. TSH was normal on 09/25/2014.  She reports she has been Vaping when her cravings are beyond her control.    She has made good improvements in her diet, but admits that she is still working on improving this.  Her last lipid panel was on 09/16/15 showing a low HDL and a high HDL.   She  has a past medical history of Hypertension; Chronic kidney disease; Hyperlipidemia; ACS (acute coronary syndrome) (09/13/2014); CAD in native artery; and Tobacco abuse (09/13/2014).    She has a current medication list which includes the following prescription(s): aspirin, atorvastatin, clopidogrel, metoprolol, nicotine, and triamterene-hydrochlorothiazide.  Ms. Keathley has No Known Allergies. She  reports that she has quit smoking. Her smoking use included Cigarettes. She smoked 0.50 packs per day. She has never used smokeless tobacco. She reports that she does not drink alcohol or use illicit drugs. She  has no sexual activity history on file.  The patient  has past surgical history that includes Hernia repair; Coronary angioplasty with stent; left heart catheterization with coronary angiogram (N/A, 09/14/2014); percutaneous coronary stent intervention (pci-s) (09/14/2014); and  Tonsillectomy (2005).  Her family history includes Diabetes in her mother; Heart disease in her mother; Hyperlipidemia in her mother; Hypertension in her mother; Lung cancer in her father.  Review of Systems  Constitutional: Negative.   Respiratory: Negative for cough and shortness of breath.   Cardiovascular: Negative for chest pain and palpitations.  Gastrointestinal: Negative.   Genitourinary: Negative.   Musculoskeletal: Negative.   Skin: Negative.   Neurological: Negative.  Negative for headaches.  Endo/Heme/Allergies: Negative.     OBJECTIVE   height is 5' 2.5" (1.588 m) and weight is 151 lb 12.8 oz (68.856 kg). Her oral temperature is 97.8 F (36.6 C). Her blood pressure is 114/82 and her pulse is 78. Her respiration is 18 and oxygen saturation is 100%.  The patient's body mass index is 27.3 kg/(m^2).  Physical Exam  Constitutional: She is oriented to person, place, and time. She appears well-developed and well-nourished. No distress.  HENT:  Right Ear: External ear normal.  Left Ear: External ear normal.  Eyes: No scleral icterus.  Neck: Normal range of motion. No thyromegaly present.  Cardiovascular: Normal rate, regular rhythm, normal heart sounds and intact distal pulses.   No extrasystoles are present. Exam reveals no gallop, no S3, no S4, no friction rub and no decreased pulses.   No murmur heard. Respiratory: Effort normal and breath sounds normal.  Neurological: She is alert and oriented to person, place, and time.  Skin: Skin is warm and dry. She is not diaphoretic.  Psychiatric: She has a normal mood and affect.    No results found for this or any previous visit (from the past 24 hour(s)).  ASSESSMENT & PLAN  [name] was seen today for medication refill, nicoderm cq,  metoprolol and lipitor.  Diagnoses and associated orders for this visit:  Status post insertion of drug-eluting stent into right coronary artery for coronary artery disease:  Patient appears to  be doing very well.  Advised that she pursue cardiac rehab.  Will refill medications and advised she return to clinic in 90 days.   - metoprolol (LOPRESSOR) 50 MG tablet; Take 1 tablet (50 mg total) by mouth 2 (two) times daily.  Essential hypertension - metoprolol (LOPRESSOR) 50 MG tablet; Take 1 tablet (50 mg total) by mouth 2 (two) times daily.  Cigarette nicotine dependence with withdrawal - nicotine (NICODERM CQ - DOSED IN MG/24 HOURS) 14 mg/24hr patch; Place 1 patch (14 mg total) onto the skin daily. - nicotine polacrilex (NICORETTE) 2 MG gum; Take 1 each (2 mg total) by mouth as needed for smoking cessation (Take half for nicotine cravings that are uncontrolled otherwise.).  Dyslipidemia (high LDL; low HDL) - atorvastatin (LIPITOR) 80 MG tablet; Take 1 tablet (80 mg total) by mouth daily at 6 PM.     The patient was instructed to to call or comeback to clinic as needed, or should symptoms warrant.  Philis Fendt, MHS, PA-C [facility] 11/08/2014 9:48 AM  I have participated in the care of this patient with the Advanced Practice Provider and agree with Diagnosis and Plan as documented. Robert P. Laney Pastor, M.D.

## 2014-11-23 ENCOUNTER — Encounter: Payer: Self-pay | Admitting: Internal Medicine

## 2014-11-23 ENCOUNTER — Ambulatory Visit (INDEPENDENT_AMBULATORY_CARE_PROVIDER_SITE_OTHER): Payer: Federal, State, Local not specified - PPO | Admitting: Internal Medicine

## 2014-11-23 VITALS — BP 128/76 | HR 72 | Ht 61.5 in | Wt 152.6 lb

## 2014-11-23 DIAGNOSIS — I249 Acute ischemic heart disease, unspecified: Secondary | ICD-10-CM

## 2014-11-23 DIAGNOSIS — I10 Essential (primary) hypertension: Secondary | ICD-10-CM

## 2014-11-23 DIAGNOSIS — E785 Hyperlipidemia, unspecified: Secondary | ICD-10-CM

## 2014-11-23 DIAGNOSIS — I2510 Atherosclerotic heart disease of native coronary artery without angina pectoris: Secondary | ICD-10-CM

## 2014-11-23 NOTE — Patient Instructions (Signed)
Your physician wants you to follow-up in: 6 months with Dr. Debara Pickett. You will receive a reminder letter in the mail two months in advance. If you don't receive a letter, please call our office to schedule the follow-up appointment.  Please have fasting lab work prior to your next office visit.

## 2014-11-23 NOTE — Progress Notes (Signed)
11/23/2014   PCP: Leotis Pain, DO   Chief Complaint  Patient presents with  . Follow-up    6 week visit per Mickel Baas, NP; no complaints    Primary Cardiologist:Dr. Alma Friendly   HPI:  44 year old Female hx of hypertension, dyslipidemia, chronic tobacco use, chronic kidney disease stage III admitted with hypertensive urgency 09/13/14 and 6/10 chest pain EKG showed lateral T-wave inversions on admission and cardiology was consulted. It was felt she had unstable angina and she was placed on nitro drip.  Cardiac catheterization with 2.25X 12 mm Promus remainder DES to the M RCA on 09/14/14 for 90% stenosis. EF noted 65- 70%.  She did well and discharged without complication.    She was on numerous BP meds and her BP was running in the 90s to 106 and she felt very weak, lethargic.  The hydralazine was stopped and the cardizem.  She is feeling better.  She has occasional brief chest pressure feeling like the discomfort she was admitted with.  She is here today for follow-up. She is actually doing quite well her questions we discussed where the pneumonia vaccine she is only 81 and essentially in good health told her that was not needed unless she wanted it she does not want a flu vaccine she's had side effects in the past and refuses to take it. She is scheduled for sleep study January 29 2 [facility]. She was recently seen by her primary care for cold symptoms and was placed on Zithromax.  She is feeling better.    She is asking about exercise which I recommended she could go back to to start slowly but work only a peripheral 1-2 weeks. She did not have a heart attack and her EF is normal.  Her mother was with her today who also has stents but the mother is or bare-metal stents. We discussed the difference between the 2 and the need for Plavix with both, and the length of time she would need to take the Plavix.  She is no longer smoking. She is using the NicoDerm.  I saw Ms.  Pinedo back in the office today. She is doing remarkably well. She stepped down to a 14 mg nicotine patch and continuing not to smoke now for 3 months. She started doing some exercise and is change her diet dramatically. She's had no bleeding problems on aspirin and Plavix. She denies any further chest pain. She had to reschedule her sleep study but is planning to get that next month.  No Known Allergies  Current Outpatient Prescriptions  Medication Sig Dispense Refill  . aspirin 81 MG chewable tablet Chew 1 tablet (81 mg total) by mouth daily.    Marland Kitchen atorvastatin (LIPITOR) 80 MG tablet Take 1 tablet (80 mg total) by mouth daily at 6 PM. 30 tablet 3  . clopidogrel (PLAVIX) 75 MG tablet Take 1 tablet (75 mg total) by mouth daily with breakfast. 30 tablet 12  . metoprolol (LOPRESSOR) 50 MG tablet Take 1 tablet (50 mg total) by mouth 2 (two) times daily. 60 tablet 3  . nicotine (NICODERM CQ - DOSED IN MG/24 HOURS) 14 mg/24hr patch Place 1 patch (14 mg total) onto the skin daily. 28 patch 3  . nicotine polacrilex (NICORETTE) 2 MG gum Take 1 each (2 mg total) by mouth as needed for smoking cessation (Take half for nicotine cravings that are uncontrolled otherwise.). 40 tablet 0  . triamterene-hydrochlorothiazide (DYAZIDE) 37.5-25  MG per capsule Take 1 each (1 capsule total) by mouth daily. 30 capsule 5   No current facility-administered medications for this visit.    Past Medical History  Diagnosis Date  . Hypertension     > 5 years  . Chronic kidney disease     she sthinks has stage 2 CKD, has had US doppler and has some stenosis , this was done in [place]   . Hyperlipidemia   . ACS (acute coronary syndrome) 09/13/2014    Unstable anginal pain s/p 2.25 x 12 mm Promus Premier DES to the RCA  . CAD in native artery   . Tobacco abuse 09/13/2014    Past Surgical History  Procedure Laterality Date  . Hernia repair    . Coronary angioplasty with stent placement      L main OK, LAD OK, CFX  30%, RCA 90%-0% w/ 2.25 x 12 mm Promus Premier DES, EF 55%  . Left heart catheterization with coronary angiogram N/A 09/14/2014    Procedure: LEFT HEART CATHETERIZATION WITH CORONARY ANGIOGRAM;  Surgeon: Burnell Blanks, MD;  Location: [facility] CATH LAB;  Service: Cardiovascular;  Laterality: N/A;  . Percutaneous coronary stent intervention (pci-s)  09/14/2014    Procedure: PERCUTANEOUS CORONARY STENT INTERVENTION (PCI-S);  Surgeon: Burnell Blanks, MD;  Location: [facility] CATH LAB;  Service: Cardiovascular;;  . Tonsillectomy  2005    10 years ago    WIO:MBTDHRC:BU colds or fevers, no weight changes, was feeling weak and tired with lower BP Skin:no rashes or ulcers HEENT:no blurred vision, no congestion CV:see HPI PUL:see HPI GI:no diarrhea constipation or melena, no indigestion GU:no hematuria, no dysuria MS:no joint pain, no claudication Neuro:no syncope, no lightheadedness Endo:no diabetes, no thyroid disease  Wt Readings from Last 3 Encounters:  11/23/14 152 lb 9.6 oz (69.219 kg)  11/08/14 151 lb 12.8 oz (68.856 kg)  10/06/14 149 lb 3.2 oz (67.677 kg)    PHYSICAL EXAM BP 128/76 mmHg  Pulse 72  Ht 5' 1.5" (1.562 m)  Wt 152 lb 9.6 oz (69.219 kg)  BMI 28.37 kg/m2  LMP 10/28/2014 General:Pleasant affect, NAD Skin:Warm and dry, brisk capillary refill HEENT:normocephalic, sclera clear, mucus membranes moist Neck:supple, no JVD, no bruits . No adenopathy Heart:S1S2 RRR without murmur, gallup, rub or click Lungs:clear without rales, rhonchi, or wheezes LAG:TXMI, non tender, + BS, do not palpate liver spleen or masses Ext:no lower ext edema, 2+ pedal pulses, 2+ radial pulses Neuro:alert and oriented, MAE, follows commands, + facial symmetry  EKG:  Deferred  ASSESSMENT AND PLAN Patient Active Problem List   Diagnosis Date Noted  . CAD (coronary artery disease), native coronary artery 09/15/2014  . Headache 09/15/2014  . UTI (urinary tract infection): Probable 09/14/2014    . Candida infection of genital region 09/14/2014  . Unstable angina 09/13/2014  . AKI (acute kidney injury) 09/13/2014  . HLD (hyperlipidemia) 09/13/2014  . Otitis 09/13/2014  . ACS (acute coronary syndrome) 09/13/2014  . Essential hypertension 09/09/2014   PLAN: 1. Ms. Virginia is doing well after her RCA stent. She will need to be on dual antiplatelet therapy at least until mid December 2016. She is tolerating atorvastatin and will need a repeat check of her cholesterol in 3 months. I've encouraged her to continue work on dietary changes. With regards to smoking cessation she has step down now to a 14 mg patch and should continue that for another 2-4 weeks before decreasing the dose further. Overall she is doing well and I'll plan  to see her back in 6 months.  Pixie Casino, MD, [facility] Attending Cardiologist [place]

## 2015-01-15 ENCOUNTER — Ambulatory Visit (INDEPENDENT_AMBULATORY_CARE_PROVIDER_SITE_OTHER): Payer: Federal, State, Local not specified - PPO | Admitting: Neurology

## 2015-01-15 DIAGNOSIS — R0683 Snoring: Secondary | ICD-10-CM

## 2015-01-15 DIAGNOSIS — G478 Other sleep disorders: Secondary | ICD-10-CM

## 2015-01-15 DIAGNOSIS — G479 Sleep disorder, unspecified: Secondary | ICD-10-CM

## 2015-01-15 DIAGNOSIS — G4719 Other hypersomnia: Secondary | ICD-10-CM

## 2015-01-16 ENCOUNTER — Ambulatory Visit (INDEPENDENT_AMBULATORY_CARE_PROVIDER_SITE_OTHER): Payer: Federal, State, Local not specified - PPO | Admitting: Physician Assistant

## 2015-01-16 VITALS — BP 110/68 | HR 61 | Temp 98.1°F | Resp 16 | Ht 62.25 in | Wt 159.8 lb

## 2015-01-16 DIAGNOSIS — L298 Other pruritus: Secondary | ICD-10-CM | POA: Diagnosis not present

## 2015-01-16 DIAGNOSIS — N898 Other specified noninflammatory disorders of vagina: Secondary | ICD-10-CM

## 2015-01-16 LAB — POCT UA - MICROSCOPIC ONLY
Casts, Ur, LPF, POC: NEGATIVE
Crystals, Ur, HPF, POC: NEGATIVE
Mucus, UA: NEGATIVE
Yeast, UA: NEGATIVE

## 2015-01-16 LAB — POCT URINALYSIS DIPSTICK
Bilirubin, UA: NEGATIVE
Blood, UA: NEGATIVE
Glucose, UA: NEGATIVE
Ketones, UA: NEGATIVE
Leukocytes, UA: NEGATIVE
Nitrite, UA: NEGATIVE
Protein, UA: NEGATIVE
Spec Grav, UA: 1.02
Urobilinogen, UA: 0.2
pH, UA: 5.5

## 2015-01-16 LAB — POCT WET PREP WITH KOH
KOH Prep POC: NEGATIVE
Trichomonas, UA: NEGATIVE
Yeast Wet Prep HPF POC: NEGATIVE

## 2015-01-16 MED ORDER — FLUCONAZOLE 150 MG PO TABS: 150.0000 mg | ORAL_TABLET | Freq: Once | ORAL | Status: DC

## 2015-01-16 NOTE — Progress Notes (Signed)
Subjective:    Patient ID: Anna Henson, female    DOB: 06-19-1971, 44 y.o.   MRN: [id number]  HPI  This is a 44 year old female who is presenting with vaginal itching and discharge x 3 weeks. Itching at introitus. She began experiencing mild anal itching 2 weeks ago. Vaginal discharge is white and without odor. She has tried monospot without any relief. She was last sexually active 6 months ago. LMP 2 weeks ago and normal. She denies dysuria, urinary frequency, abdominal pain, N/V/D.  Review of Systems  Constitutional: Negative for fever and chills.  Gastrointestinal: Negative for nausea, vomiting and abdominal pain.  Genitourinary: Negative for dysuria and frequency.       Vaginal itching  Skin: Negative for rash.    Patient Active Problem List   Diagnosis Date Noted  . CAD (coronary artery disease), native coronary artery 09/15/2014  . Headache 09/15/2014  . UTI (urinary tract infection): Probable 09/14/2014  . Candida infection of genital region 09/14/2014  . Unstable angina 09/13/2014  . AKI (acute kidney injury) 09/13/2014  . HLD (hyperlipidemia) 09/13/2014  . Otitis 09/13/2014  . ACS (acute coronary syndrome) 09/13/2014  . Essential hypertension 09/09/2014   Prior to Admission medications   Medication Sig Start Date End Date Taking? Authorizing Provider  aspirin 81 MG chewable tablet Chew 1 tablet (81 mg total) by mouth daily. 09/16/14  Yes Nita Sells, MD  atorvastatin (LIPITOR) 80 MG tablet Take 1 tablet (80 mg total) by mouth daily at 6 PM. 11/08/14  Yes Tereasa Coop, PA-C  clopidogrel (PLAVIX) 75 MG tablet Take 1 tablet (75 mg total) by mouth daily with breakfast. 09/16/14  Yes Nita Sells, MD  metoprolol (LOPRESSOR) 50 MG tablet Take 1 tablet (50 mg total) by mouth 2 (two) times daily. 11/08/14  Yes Tereasa Coop, PA-C  nicotine (NICODERM CQ - DOSED IN MG/24 HOURS) 14 mg/24hr patch Place 1 patch (14 mg total) onto the skin daily. 11/08/14  Yes Tereasa Coop, PA-C  nicotine polacrilex (NICORETTE) 2 MG gum Take 1 each (2 mg total) by mouth as needed for smoking cessation (Take half for nicotine cravings that are uncontrolled otherwise.). 11/08/14  Yes Tereasa Coop, PA-C  triamterene-hydrochlorothiazide (DYAZIDE) 37.5-25 MG per capsule Take 1 each (1 capsule total) by mouth daily. 09/07/14  Yes Thao P Le, DO   No Known Allergies  Patient's social and family history were reviewed.     Objective:   Physical Exam  Constitutional: She is oriented to person, place, and time. She appears well-developed and well-nourished. No distress.  HENT:  Head: Normocephalic and atraumatic.  Right Ear: Hearing normal.  Left Ear: Hearing normal.  Nose: Nose normal.  Eyes: Conjunctivae and lids are normal. Right eye exhibits no discharge. Left eye exhibits no discharge. No scleral icterus.  Cardiovascular: Normal rate, regular rhythm, normal heart sounds and normal pulses.   No murmur heard. Pulmonary/Chest: Effort normal and breath sounds normal. No respiratory distress. She has no wheezes. She has no rhonchi. She has no rales.  Abdominal: Soft. Normal appearance. There is no tenderness.  Genitourinary: Uterus normal. There is no rash, tenderness or lesion on the right labia. There is no rash, tenderness or lesion on the left labia. Cervix exhibits no motion tenderness, no discharge and no friability. Right adnexum displays no tenderness and no fullness. Left adnexum displays no tenderness and no fullness. No erythema or tenderness in the vagina. Vaginal discharge (white, thick) found.  Anus normal  without rash or lesion  Musculoskeletal: Normal range of motion.  Neurological: She is alert and oriented to person, place, and time.  Skin: Skin is warm, dry and intact. No lesion and no rash noted.  Psychiatric: She has a normal mood and affect. Her speech is normal and behavior is normal. Thought content normal.   BP 110/68 mmHg  Pulse 61  Temp(Src) 98.1  F (36.7 C) (Oral)  Resp 16  Ht 5' 2.25" (1.581 m)  Wt 159 lb 12.8 oz (72.485 kg)  BMI 29.00 kg/m2  SpO2 100%  LMP 01/05/2015  Results for orders placed or performed in visit on 01/16/15  POCT UA - Microscopic Only  Result Value Ref Range   WBC, Ur, HPF, POC 1-3    RBC, urine, microscopic 0-1    Bacteria, U Microscopic trace    Mucus, UA neg    Epithelial cells, urine per micros 1-3    Crystals, Ur, HPF, POC neg    Casts, Ur, LPF, POC neg    Yeast, UA neg   POCT urinalysis dipstick  Result Value Ref Range   Color, UA yellow    Clarity, UA clear    Glucose, UA neg    Bilirubin, UA neg    Ketones, UA neg    Spec Grav, UA 1.020    Blood, UA neg    pH, UA 5.5    Protein, UA neg    Urobilinogen, UA 0.2    Nitrite, UA neg    Leukocytes, UA Negative   POCT Wet Prep with KOH  Result Value Ref Range   Trichomonas, UA Negative    Clue Cells Wet Prep HPF POC 2-4    Epithelial Wet Prep HPF POC 10-20    Yeast Wet Prep HPF POC neg    Bacteria Wet Prep HPF POC 3+    RBC Wet Prep HPF POC 1-3    WBC Wet Prep HPF POC 2-4    KOH Prep POC Negative       Assessment & Plan:  1. Vaginal itching UA and wet prep negative, G/C pending. Prescribed fluconazole to see if this helps. If no help in 1 week, she will call back and will try treatment for BV.  - POCT UA - Microscopic Only - POCT urinalysis dipstick - POCT Wet Prep with KOH - GC/Chlamydia Probe Amp - fluconazole (DIFLUCAN) 150 MG tablet; Take 1 tablet (150 mg total) by mouth once. Repeat if needed  Dispense: 2 tablet; Refill: 0   Benjaman Pott. Drenda Freeze, MHS [facility]  01/16/2015

## 2015-01-16 NOTE — Sleep Study (Signed)
Please see the scanned sleep study interpretation located in the Procedure tab within the Chart Review section. 

## 2015-01-16 NOTE — Patient Instructions (Signed)
Take first yeast pill today, repeat in 2 days. If you are not getting in 1 week - give me a call. We may consider treated you for BV. Non-scented dove bar soap.

## 2015-01-19 LAB — GC/CHLAMYDIA PROBE AMP
CT Probe RNA: NEGATIVE
GC Probe RNA: NEGATIVE

## 2015-01-22 ENCOUNTER — Encounter: Payer: Self-pay | Admitting: Neurology

## 2015-01-22 ENCOUNTER — Telehealth: Payer: Self-pay | Admitting: Neurology

## 2015-01-22 NOTE — Telephone Encounter (Signed)
Anna Henson:  Please call and notify the patient that the recent sleep study did not show any significant obstructive sleep apnea. However, the patient did not sleep very much during the study. She had a big period of wakefulness between right after midnight until almost 3:20 AM. Nevertheless, she achieved over 3 hours of sleep time and all stages of sleep were observed. During sleep, she had benign findings. Her oxygen saturations were great. She has no evidence of obstructive sleep apnea, just some intermittent snoring. At this juncture, she can follow up with her primary care physician and I can see her as needed. If she wishes, though, we can arrange for follow up appointment with me to discuss the details of this sleep study. An organic sleep disorder does not seem to account for her daytime sleepiness complaints. Please arrange a followup appointment (if desired). Once you have spoken to patient, you can close this encounter.   Thanks,  Star Age, MD, PhD [facility])

## 2015-01-26 NOTE — Telephone Encounter (Signed)
Left message to call back for Sleep Study results.

## 2015-01-26 NOTE — Telephone Encounter (Signed)
I spoke to patient and she is aware of sleep study results. She would prefer to follow up with PCP. She will call us if she needs anything.

## 2015-01-26 NOTE — Telephone Encounter (Signed)
Patient called/returning Leighton Parody, RN call. Please call and advice. C/b [phone]

## 2015-02-23 ENCOUNTER — Other Ambulatory Visit: Payer: Self-pay | Admitting: Physician Assistant

## 2015-02-23 ENCOUNTER — Other Ambulatory Visit: Payer: Self-pay | Admitting: Family Medicine

## 2015-04-10 ENCOUNTER — Other Ambulatory Visit: Payer: Self-pay | Admitting: Family Medicine

## 2015-04-13 ENCOUNTER — Other Ambulatory Visit: Payer: Self-pay | Admitting: Physician Assistant

## 2015-04-13 ENCOUNTER — Other Ambulatory Visit: Payer: Self-pay | Admitting: Family Medicine

## 2015-05-14 ENCOUNTER — Other Ambulatory Visit: Payer: Self-pay | Admitting: Family Medicine

## 2015-05-14 ENCOUNTER — Other Ambulatory Visit: Payer: Self-pay | Admitting: Physician Assistant

## 2015-05-15 ENCOUNTER — Other Ambulatory Visit: Payer: Self-pay | Admitting: Family Medicine

## 2015-06-13 ENCOUNTER — Other Ambulatory Visit: Payer: Self-pay | Admitting: Family Medicine

## 2015-07-19 ENCOUNTER — Ambulatory Visit (INDEPENDENT_AMBULATORY_CARE_PROVIDER_SITE_OTHER): Payer: Federal, State, Local not specified - PPO | Admitting: Internal Medicine

## 2015-07-19 VITALS — BP 124/76 | HR 69 | Temp 98.3°F | Resp 16 | Ht 62.75 in | Wt 166.0 lb

## 2015-07-19 DIAGNOSIS — I2584 Coronary atherosclerosis due to calcified coronary lesion: Secondary | ICD-10-CM

## 2015-07-19 DIAGNOSIS — R05 Cough: Secondary | ICD-10-CM

## 2015-07-19 DIAGNOSIS — Z955 Presence of coronary angioplasty implant and graft: Secondary | ICD-10-CM | POA: Diagnosis not present

## 2015-07-19 DIAGNOSIS — J0140 Acute pansinusitis, unspecified: Secondary | ICD-10-CM | POA: Diagnosis not present

## 2015-07-19 DIAGNOSIS — Z7189 Other specified counseling: Secondary | ICD-10-CM

## 2015-07-19 DIAGNOSIS — I10 Essential (primary) hypertension: Secondary | ICD-10-CM

## 2015-07-19 DIAGNOSIS — I2510 Atherosclerotic heart disease of native coronary artery without angina pectoris: Secondary | ICD-10-CM

## 2015-07-19 DIAGNOSIS — R059 Cough, unspecified: Secondary | ICD-10-CM

## 2015-07-19 LAB — COMPREHENSIVE METABOLIC PANEL
ALT: 21 U/L (ref 6–29)
AST: 22 U/L (ref 10–30)
Albumin: 4.4 g/dL (ref 3.6–5.1)
Alkaline Phosphatase: 62 U/L (ref 33–115)
BUN: 14 mg/dL (ref 7–25)
CO2: 28 mmol/L (ref 20–31)
Calcium: 9.3 mg/dL (ref 8.6–10.2)
Chloride: 103 mmol/L (ref 98–110)
Creat: 1.17 mg/dL — ABNORMAL HIGH (ref 0.50–1.10)
Glucose, Bld: 106 mg/dL — ABNORMAL HIGH (ref 65–99)
Potassium: 3.7 mmol/L (ref 3.5–5.3)
Sodium: 136 mmol/L (ref 135–146)
Total Bilirubin: 0.3 mg/dL (ref 0.2–1.2)
Total Protein: 6.9 g/dL (ref 6.1–8.1)

## 2015-07-19 LAB — POCT CBC
Granulocyte percent: 64.9 %G (ref 37–80)
HCT, POC: 38.5 % (ref 37.7–47.9)
Hemoglobin: 13.4 g/dL (ref 12.2–16.2)
Lymph, poc: 2.8 (ref 0.6–3.4)
MCH, POC: 31.2 pg (ref 27–31.2)
MCHC: 34.9 g/dL (ref 31.8–35.4)
MCV: 89.5 fL (ref 80–97)
MID (cbc): 0.5 (ref 0–0.9)
MPV: 7 fL (ref 0–99.8)
POC Granulocyte: 6.1 (ref 2–6.9)
POC LYMPH PERCENT: 29.6 %L (ref 10–50)
POC MID %: 5.5 %M (ref 0–12)
Platelet Count, POC: 375 10*3/uL (ref 142–424)
RBC: 4.29 M/uL (ref 4.04–5.48)
RDW, POC: 13.4 %
WBC: 9.4 10*3/uL (ref 4.6–10.2)

## 2015-07-19 LAB — LIPID PANEL
Cholesterol: 143 mg/dL (ref 125–200)
HDL: 31 mg/dL — ABNORMAL LOW (ref >=46)
LDL Cholesterol: 90 mg/dL (ref <130)
Total CHOL/HDL Ratio: 4.6 Ratio (ref <=5.0)
Triglycerides: 111 mg/dL (ref <150)
VLDL: 22 mg/dL (ref <30)

## 2015-07-19 LAB — POC MICROSCOPIC URINALYSIS (UMFC)

## 2015-07-19 LAB — POCT URINALYSIS DIP (MANUAL ENTRY)
Bilirubin, UA: NEGATIVE
Blood, UA: NEGATIVE
Glucose, UA: NEGATIVE
Ketones, POC UA: NEGATIVE
Leukocytes, UA: NEGATIVE
Nitrite, UA: NEGATIVE
Protein Ur, POC: NEGATIVE
Spec Grav, UA: 1.02
Urobilinogen, UA: 0.2
pH, UA: 6.5

## 2015-07-19 LAB — POCT SEDIMENTATION RATE: POCT SED RATE: 24 mm/hr — AB (ref 0–22)

## 2015-07-19 LAB — C-REACTIVE PROTEIN: CRP: <0.5 mg/dL (ref <0.60)

## 2015-07-19 LAB — TSH: TSH: 0.496 u[IU]/mL (ref 0.350–4.500)

## 2015-07-19 MED ORDER — AZITHROMYCIN 500 MG PO TABS: 500.0000 mg | ORAL_TABLET | Freq: Every day | ORAL | Status: DC

## 2015-07-19 NOTE — Progress Notes (Signed)
Patient ID: Anna Henson, female   DOB: 01-20-1971, 44 y.o.   MRN: [id number]   07/19/2015 at 9:34 AM  [name] / DOB: [birth year] / MRN: [id number]  Problem list reviewed and updated by me where necessary.   SUBJECTIVE  Anna Henson is a 44 y.o. well appearing female presenting for the chief complaint of sinus pressure , ha, mild cough with no sob or cp. She also has htn, cad with stent, hyperlipidemia and needs meds refilled. She is soon to see the cardiologist for 1 year f/up. She has no angina.     She  has a past medical history of Hypertension; Chronic kidney disease; Hyperlipidemia; ACS (acute coronary syndrome) ([place]) (09/13/2014); CAD in native artery; and Tobacco abuse (09/13/2014).    Medications reviewed and updated by myself where necessary, and exist elsewhere in the encounter.   Anna Henson has No Known Allergies. She  reports that she has quit smoking. Her smoking use included Cigarettes. She smoked 0.50 packs per day. She has never used smokeless tobacco. She reports that she does not drink alcohol or use illicit drugs. She  has no sexual activity history on file. The patient  has past surgical history that includes Hernia repair; Coronary angioplasty with stent; left heart catheterization with coronary angiogram (N/A, 09/14/2014); percutaneous coronary stent intervention (pci-s) (09/14/2014); and Tonsillectomy (2005).  Her family history includes Diabetes in her mother; Heart disease in her mother; Hyperlipidemia in her mother; Hypertension in her mother; Lung cancer in her father.  Review of Systems  Constitutional: Negative for fever.  HENT: Positive for congestion. Negative for ear pain, hearing loss, nosebleeds and sore throat.   Eyes: Negative.   Respiratory: Positive for cough. Negative for hemoptysis, sputum production, shortness of breath and wheezing.   Cardiovascular: Negative.  Negative for chest pain.  Gastrointestinal: Negative for nausea.  Skin: Negative for  rash.  Neurological: Positive for headaches. Negative for dizziness.  Endo/Heme/Allergies: Negative for environmental allergies.    OBJECTIVE  Her  height is 5' 2.75" (1.594 m) and weight is 166 lb (75.297 kg). Her oral temperature is 98.3 F (36.8 C). Her blood pressure is 124/76 and her pulse is 69. Her respiration is 16 and oxygen saturation is 99%.  The patient's body mass index is 29.63 kg/(m^2).  Physical Exam  Constitutional: She is oriented to person, place, and time. She appears well-developed and well-nourished.  HENT:  Head: Normocephalic.    Right Ear: External ear normal.  Left Ear: External ear normal.  Nose: Mucosal edema, rhinorrhea and sinus tenderness present. Right sinus exhibits maxillary sinus tenderness and frontal sinus tenderness. Left sinus exhibits maxillary sinus tenderness and frontal sinus tenderness.  Mouth/Throat: Oropharynx is clear and moist.  Eyes: Conjunctivae and EOM are normal. No scleral icterus.  Neck: Normal range of motion. Neck supple.  Cardiovascular: Normal rate, regular rhythm and normal heart sounds.   Respiratory: Effort normal and breath sounds normal.  GI: She exhibits no distension.  Musculoskeletal: Normal range of motion.  Lymphadenopathy:    She has no cervical adenopathy.  Neurological: She is alert and oriented to person, place, and time. She exhibits normal muscle tone. Coordination normal.  Skin: Skin is warm and dry.  Psychiatric: She has a normal mood and affect. Her behavior is normal.    Results for orders placed or performed in visit on 07/19/15 (from the past 24 hour(s))  POCT CBC     Status: None   Collection Time: 07/19/15  9:15 AM  Result  Value Ref Range   WBC 9.4 4.6 - 10.2 K/uL   Lymph, poc 2.8 0.6 - 3.4   POC LYMPH PERCENT 29.6 10 - 50 %L   MID (cbc) 0.5 0 - 0.9   POC MID % 5.5 0 - 12 %M   POC Granulocyte 6.1 2 - 6.9   Granulocyte percent 64.9 37 - 80 %G   RBC 4.29 4.04 - 5.48 M/uL   Hemoglobin 13.4 12.2  - 16.2 g/dL   HCT, POC 38.5 37.7 - 47.9 %   MCV 89.5 80 - 97 fL   MCH, POC 31.2 27 - 31.2 pg   MCHC 34.9 31.8 - 35.4 g/dL   RDW, POC 13.4 %   Platelet Count, POC 375 142 - 424 K/uL   MPV 7.0 0 - 99.8 fL  POCT Microscopic Urinalysis ([facility])     Status: Abnormal   Collection Time: 07/19/15  9:20 AM  Result Value Ref Range   WBC,UR,HPF,POC None None WBC/hpf   RBC,UR,HPF,POC None None RBC/hpf   Bacteria Few (A) None   Mucus Present (A) Absent   Epithelial Cells, UR Per Microscopy Few (A) None cells/hpf  POCT urinalysis dipstick     Status: None   Collection Time: 07/19/15  9:21 AM  Result Value Ref Range   Color, UA yellow yellow   Clarity, UA clear clear   Glucose, UA negative negative   Bilirubin, UA negative negative   Ketones, POC UA negative negative   Spec Grav, UA 1.020    Blood, UA negative negative   pH, UA 6.5    Protein Ur, POC negative negative   Urobilinogen, UA 0.2    Nitrite, UA Negative Negative   Leukocytes, UA Negative Negative    ASSESSMENT & PLAN  Anna Henson was seen today for medication refill and sinusitis.  Diagnoses and all orders for this visit:  Coronary atherosclerosis due to calcified coronary lesion -     POCT CBC -     POCT Microscopic Urinalysis ([facility]) -     POCT urinalysis dipstick -     POCT SEDIMENTATION RATE -     Comprehensive metabolic panel -     Lipid panel -     TSH -     C-reactive protein  Stented coronary artery -     POCT CBC -     POCT Microscopic Urinalysis ([facility]) -     POCT urinalysis dipstick -     POCT SEDIMENTATION RATE -     Comprehensive metabolic panel -     Lipid panel -     TSH -     C-reactive protein  Acute pansinusitis, recurrence not specified -     POCT CBC -     POCT Microscopic Urinalysis ([facility]) -     POCT urinalysis dipstick -     POCT SEDIMENTATION RATE -     Comprehensive metabolic panel -     Lipid panel -     TSH -     C-reactive protein -     azithromycin (ZITHROMAX) 500 MG tablet; Take 1  tablet (500 mg total) by mouth daily.  Essential hypertension -     POCT CBC -     POCT Microscopic Urinalysis ([facility]) -     POCT urinalysis dipstick -     POCT SEDIMENTATION RATE -     Comprehensive metabolic panel -     Lipid panel -     TSH -     C-reactive  protein  Cough -     POCT CBC -     POCT Microscopic Urinalysis ([facility]) -     POCT urinalysis dipstick -     POCT SEDIMENTATION RATE -     Comprehensive metabolic panel -     Lipid panel -     TSH -     C-reactive protein -     azithromycin (ZITHROMAX) 500 MG tablet; Take 1 tablet (500 mg total) by mouth daily.  Encounter for medication review and counseling -     POCT CBC -     POCT Microscopic Urinalysis ([facility]) -     POCT urinalysis dipstick -     POCT SEDIMENTATION RATE -     Comprehensive metabolic panel -     Lipid panel -     TSH -     C-reactive protein  sinus

## 2015-07-19 NOTE — Patient Instructions (Signed)

## 2015-07-21 ENCOUNTER — Other Ambulatory Visit: Payer: Self-pay | Admitting: Physician Assistant

## 2015-07-22 ENCOUNTER — Telehealth: Payer: Self-pay

## 2015-07-22 NOTE — Telephone Encounter (Signed)
This was sent today.

## 2015-07-22 NOTE — Telephone Encounter (Signed)
Refill on Dyazide. She really needs this.  [phone]

## 2015-08-09 ENCOUNTER — Other Ambulatory Visit: Payer: Self-pay | Admitting: Physician Assistant

## 2015-08-27 ENCOUNTER — Other Ambulatory Visit: Payer: Self-pay | Admitting: Physician Assistant

## 2015-08-29 NOTE — Telephone Encounter (Signed)
Patient is calling check on refill request. She states that she asked lipitor, clopidogrel, arvastatin, metoprolol, triamterene during her last office visit. Patient would like each of these refilled.

## 2015-08-30 ENCOUNTER — Telehealth: Payer: Self-pay

## 2015-08-30 MED ORDER — TRIAMTERENE-HCTZ 37.5-25 MG PO CAPS: ORAL_CAPSULE | ORAL | Status: DC

## 2015-08-30 MED ORDER — METOPROLOL TARTRATE 50 MG PO TABS: ORAL_TABLET | ORAL | Status: DC

## 2015-08-30 NOTE — Telephone Encounter (Signed)
Pt states she is not happy with the way we are doing her medication, was suppose to have it called in the day she was here and it wasn't, pt states she have been calling calling And no one have called her back. Would like to speak with a manager if she doesn't get satisfaction today. Please call [phone]

## 2015-08-30 NOTE — Telephone Encounter (Signed)
Spoke with pt, Rx's sent.

## 2015-09-20 ENCOUNTER — Telehealth: Payer: Self-pay | Admitting: Internal Medicine

## 2015-09-20 DIAGNOSIS — E785 Hyperlipidemia, unspecified: Secondary | ICD-10-CM

## 2015-09-20 DIAGNOSIS — Z79899 Other long term (current) drug therapy: Secondary | ICD-10-CM

## 2015-09-20 NOTE — Telephone Encounter (Signed)
Tre is calling to find out if she should still be taking Plavix.Marland Kitchen Please call   Thanks

## 2015-09-20 NOTE — Telephone Encounter (Signed)
Ok to d/c plavix. Remain on aspirin 81 mg daily. Have her obtain labwork - CMET and lipid profile (regular). I will review. Follow-up with me at next available (I know this is likely late January or Feb which is fine).  Dr. Lemmie Evens

## 2015-09-20 NOTE — Telephone Encounter (Signed)
Pt last seen mid-February. On DAPT for stents. She was aware plan was to remain on Plavix until mid-December and wanted to see if OK to go off of it.  She is aware Dr. Debara Pickett may want to see for f/u - had recommended 6 mo f/u which was apparently missed/never scheduled.  Also wanted me to make sure he was aware that labwork ordered by her PCP, including lipid profile and other tests was done. I see this viewable in EPIC from 07/19/15 and acknowledged.  Will defer to Dr. Debara Pickett for review and recommendations.

## 2015-09-22 NOTE — Telephone Encounter (Signed)
Pt given instructions for med changes, return for labwork and set up for appt in Jan. She is aware to call if any further needs.

## 2015-10-10 DIAGNOSIS — G373 Acute transverse myelitis in demyelinating disease of central nervous system: Secondary | ICD-10-CM

## 2015-10-10 HISTORY — DX: Acute transverse myelitis in demyelinating disease of central nervous system: G37.3

## 2015-10-26 LAB — COMPREHENSIVE METABOLIC PANEL
ALT: 19 U/L (ref 6–29)
AST: 21 U/L (ref 10–30)
Albumin: 4 g/dL (ref 3.6–5.1)
Alkaline Phosphatase: 47 U/L (ref 33–115)
BUN: 14 mg/dL (ref 7–25)
CO2: 25 mmol/L (ref 20–31)
Calcium: 9.2 mg/dL (ref 8.6–10.2)
Chloride: 104 mmol/L (ref 98–110)
Creat: 0.98 mg/dL (ref 0.50–1.10)
Glucose, Bld: 91 mg/dL (ref 65–99)
Potassium: 3.7 mmol/L (ref 3.5–5.3)
Sodium: 137 mmol/L (ref 135–146)
Total Bilirubin: 0.3 mg/dL (ref 0.2–1.2)
Total Protein: 6.5 g/dL (ref 6.1–8.1)

## 2015-10-26 LAB — LIPID PANEL
Cholesterol: 128 mg/dL (ref 125–200)
HDL: 33 mg/dL — ABNORMAL LOW (ref >=46)
LDL Cholesterol: 76 mg/dL (ref <130)
Total CHOL/HDL Ratio: 3.9 Ratio (ref <=5.0)
Triglycerides: 97 mg/dL (ref <150)
VLDL: 19 mg/dL (ref <30)

## 2015-11-07 ENCOUNTER — Ambulatory Visit (INDEPENDENT_AMBULATORY_CARE_PROVIDER_SITE_OTHER): Payer: Federal, State, Local not specified - PPO | Admitting: Family Medicine

## 2015-11-07 VITALS — BP 138/86 | HR 82 | Temp 97.7°F | Resp 18 | Ht 62.0 in | Wt 164.6 lb

## 2015-11-07 DIAGNOSIS — E785 Hyperlipidemia, unspecified: Secondary | ICD-10-CM | POA: Diagnosis not present

## 2015-11-07 DIAGNOSIS — J0101 Acute recurrent maxillary sinusitis: Secondary | ICD-10-CM | POA: Diagnosis not present

## 2015-11-07 DIAGNOSIS — I10 Essential (primary) hypertension: Secondary | ICD-10-CM

## 2015-11-07 DIAGNOSIS — IMO0001 Reserved for inherently not codable concepts without codable children: Secondary | ICD-10-CM

## 2015-11-07 DIAGNOSIS — R209 Unspecified disturbances of skin sensation: Secondary | ICD-10-CM

## 2015-11-07 DIAGNOSIS — I2510 Atherosclerotic heart disease of native coronary artery without angina pectoris: Secondary | ICD-10-CM | POA: Diagnosis not present

## 2015-11-07 DIAGNOSIS — H9201 Otalgia, right ear: Secondary | ICD-10-CM | POA: Diagnosis not present

## 2015-11-07 LAB — POCT CBC
Granulocyte percent: 64.3 %G (ref 37–80)
HCT, POC: 34.9 % — AB (ref 37.7–47.9)
Hemoglobin: 12.6 g/dL (ref 12.2–16.2)
Lymph, poc: 3 (ref 0.6–3.4)
MCH, POC: 32.6 pg — AB (ref 27–31.2)
MCHC: 36.1 g/dL — AB (ref 31.8–35.4)
MCV: 90.3 fL (ref 80–97)
MID (cbc): 0.6 (ref 0–0.9)
MPV: 7.3 fL (ref 0–99.8)
POC Granulocyte: 6.5 (ref 2–6.9)
POC LYMPH PERCENT: 29.5 %L (ref 10–50)
POC MID %: 6.2 %M (ref 0–12)
Platelet Count, POC: 343 10*3/uL (ref 142–424)
RBC: 3.87 M/uL — AB (ref 4.04–5.48)
RDW, POC: 14 %
WBC: 10.1 10*3/uL (ref 4.6–10.2)

## 2015-11-07 LAB — POCT SEDIMENTATION RATE: POCT SED RATE: 15 mm/hr (ref 0–22)

## 2015-11-07 MED ORDER — AMOXICILLIN 500 MG PO TABS
1000.0000 mg | ORAL_TABLET | Freq: Two times a day (BID) | ORAL | Status: DC
Start: 2015-11-07 — End: 2015-12-21

## 2015-11-07 MED ORDER — FLUTICASONE PROPIONATE 50 MCG/ACT NA SUSP: 2.0000 | Freq: Every day | NASAL | Status: DC

## 2015-11-07 NOTE — Progress Notes (Signed)
Subjective:    Patient ID: Anna Henson, female    DOB: Jun 29, 1971, 45 y.o.   MRN: [id number]  11/07/2015  Cough and Otitis Media   HPI This 45 y.o. female presents for evaluation of R ear pain and numbness. Started with cold symptoms two weeks ago including cough, phlegm, congestion. Then started getting ear ache five days ago.  Using OTC ear drops; improving; felt like a bump in ear canal.  Then two days ago, pain acutely worsened with sawallowing. Headaches developed on R side two days ago. Yesterday woke up and numbness around ear, neck anterior, and shoulder area.  Hearing normally from R; no tinnitus.  No drainage from R ear. Ear drops did not help; OTC ear ache drops.  Duration of ear pain and headache for three days; now no pain in ear or headache.  Now only has numbness; Some neck tightness.  Able to work everyday.  No facial weakness/slurred speech/swallowing difficulties.  No blurred vision or diplopia.  No facial droop.    Review of Systems  Constitutional: Negative for fever, chills, diaphoresis and fatigue.  HENT: Positive for congestion, postnasal drip, rhinorrhea and voice change. Negative for ear discharge, ear pain, facial swelling, hearing loss, mouth sores, sinus pressure, sore throat and trouble swallowing.   Respiratory: Negative for cough and shortness of breath.   Cardiovascular: Negative for chest pain, palpitations and leg swelling.  Gastrointestinal: Negative for nausea, vomiting, abdominal pain, diarrhea and constipation.  Skin: Negative for rash.  Neurological: Positive for numbness. Negative for dizziness, tremors, syncope, facial asymmetry, speech difficulty, weakness, light-headedness and headaches.    Past Medical History  Diagnosis Date  . Hypertension     > 5 years  . Chronic kidney disease     she sthinks has stage 2 CKD, has had US doppler and has some stenosis , this was done in [place]   . Hyperlipidemia   . ACS (acute coronary syndrome) ([place])  09/13/2014    Unstable anginal pain s/p 2.25 x 12 mm Promus Premier DES to the RCA  . CAD in native artery   . Tobacco abuse 09/13/2014   Past Surgical History  Procedure Laterality Date  . Hernia repair    . Coronary angioplasty with stent placement      L main OK, LAD OK, CFX 30%, RCA 90%-0% w/ 2.25 x 12 mm Promus Premier DES, EF 55%  . Left heart catheterization with coronary angiogram N/A 09/14/2014    Procedure: LEFT HEART CATHETERIZATION WITH CORONARY ANGIOGRAM;  Surgeon: Burnell Blanks, MD;  Location: [facility] CATH LAB;  Service: Cardiovascular;  Laterality: N/A;  . Percutaneous coronary stent intervention (pci-s)  09/14/2014    Procedure: PERCUTANEOUS CORONARY STENT INTERVENTION (PCI-S);  Surgeon: Burnell Blanks, MD;  Location: [facility] CATH LAB;  Service: Cardiovascular;;  . Tonsillectomy  2005    10 years ago   No Known Allergies  Social History   Social History  . Marital Status: Single    Spouse Name: N/A  . Number of Children: 0  . Years of Education: masters   Occupational History  .      [employer]   Social History Main Topics  . Smoking status: Former Smoker -- 0.50 packs/day    Types: Cigarettes  . Smokeless tobacco: Never Used     Comment: on patch since 09/09/14  . Alcohol Use: No  . Drug Use: No  . Sexual Activity: Not on file   Other Topics Concern  .  Not on file   Social History Narrative   Consumes 2 cups of caffeine daily   Family History  Problem Relation Age of Onset  . Diabetes Mother   . Heart disease Mother   . Hyperlipidemia Mother   . Hypertension Mother   . Lung cancer Father        Objective:    BP 138/86 mmHg  Pulse 82  Temp(Src) 97.7 F (36.5 C) (Oral)  Resp 18  Ht 5\' 2"  (1.575 m)  Wt 164 lb 9.6 oz (74.662 kg)  BMI 30.10 kg/m2  SpO2 99%  LMP 11/07/2015 Physical Exam  Constitutional: She is oriented to person, place, and time. She appears well-developed and well-nourished. No distress.  HENT:  Head: Normocephalic  and atraumatic.  Right Ear: Tympanic membrane, external ear and ear canal normal.  Left Ear: Tympanic membrane, external ear and ear canal normal.  Nose: Mucosal edema and rhinorrhea present. Right sinus exhibits no maxillary sinus tenderness and no frontal sinus tenderness. Left sinus exhibits no maxillary sinus tenderness and no frontal sinus tenderness.  Mouth/Throat: Uvula is midline, oropharynx is clear and moist and mucous membranes are normal. No oropharyngeal exudate, posterior oropharyngeal edema or posterior oropharyngeal erythema.  Eyes: Conjunctivae are normal. Pupils are equal, round, and reactive to light.  Neck: Normal range of motion. Neck supple.  Cardiovascular: Normal rate, regular rhythm and normal heart sounds.  Exam reveals no gallop and no friction rub.   No murmur heard. Pulmonary/Chest: Effort normal and breath sounds normal. She has no wheezes. She has no rales.  Musculoskeletal:       Cervical back: Normal. She exhibits normal range of motion, no tenderness, no bony tenderness, no pain, no spasm and normal pulse.  Lymphadenopathy:    She has no cervical adenopathy.  Neurological: She is alert and oriented to person, place, and time. She has normal strength. No cranial nerve deficit. She exhibits normal muscle tone. Coordination normal.  Skin: No rash noted. She is not diaphoretic.  Psychiatric: She has a normal mood and affect. Her behavior is normal.  Nursing note and vitals reviewed.  Results for orders placed or performed in visit on 11/07/15  POCT CBC  Result Value Ref Range   WBC 10.1 4.6 - 10.2 K/uL   Lymph, poc 3.0 0.6 - 3.4   POC LYMPH PERCENT 29.5 10 - 50 %L   MID (cbc) 0.6 0 - 0.9   POC MID % 6.2 0 - 12 %M   POC Granulocyte 6.5 2 - 6.9   Granulocyte percent 64.3 37 - 80 %G   RBC 3.87 (A) 4.04 - 5.48 M/uL   Hemoglobin 12.6 12.2 - 16.2 g/dL   HCT, POC 34.9 (A) 37.7 - 47.9 %   MCV 90.3 80 - 97 fL   MCH, POC 32.6 (A) 27 - 31.2 pg   MCHC 36.1 (A) 31.8  - 35.4 g/dL   RDW, POC 14.0 %   Platelet Count, POC 343 142 - 424 K/uL   MPV 7.3 0 - 99.8 fL  POCT SEDIMENTATION RATE  Result Value Ref Range   POCT SED RATE 15 0 - 22 mm/hr       Assessment & Plan:   1. Acute recurrent maxillary sinusitis   2. Paresthesias/numbness   3. Otalgia of right ear   4. Coronary artery disease involving native coronary artery of native heart without angina pectoris   5. Essential hypertension   6. HLD (hyperlipidemia)    -New. -Etiology of paresthesias  unclear; ddx includes early Herpes Zoster, nerve inflammation from recent cervical LAD, cervical radiculopathy. Minimal to no neck pain at this time.  No evidence of Bell's Palsy; normal neurological exam. -Treat with Amoxicillin for sinusitis. -Due to benign exam will avoid Prednisone at this time but consider to treat nerve inflammation if persists.   -RTC for acute worsening, lack of improvement, or development of new symptoms. -Known coronary artery disease thus higher risk for CVA yet presentation not consistent with such.   Orders Placed This Encounter  Procedures  . POCT CBC  . POCT SEDIMENTATION RATE   Meds ordered this encounter  Medications  . amoxicillin (AMOXIL) 500 MG tablet    Sig: Take 2 tablets (1,000 mg total) by mouth 2 (two) times daily.    Dispense:  40 tablet    Refill:  0  . fluticasone (FLONASE) 50 MCG/ACT nasal spray    Sig: Place 2 sprays into both nostrils daily.    Dispense:  16 g    Refill:  12    No Follow-up on file.    Aline Wesche Elayne Guerin, M.D. [facility] [address] [place], [place]  [address] [phone] phone [phone] fax

## 2015-11-07 NOTE — Patient Instructions (Signed)

## 2015-11-09 ENCOUNTER — Ambulatory Visit (INDEPENDENT_AMBULATORY_CARE_PROVIDER_SITE_OTHER): Payer: Federal, State, Local not specified - PPO | Admitting: Internal Medicine

## 2015-11-09 ENCOUNTER — Encounter: Payer: Self-pay | Admitting: Internal Medicine

## 2015-11-09 VITALS — BP 122/76 | HR 66 | Ht 61.5 in | Wt 166.0 lb

## 2015-11-09 DIAGNOSIS — K117 Disturbances of salivary secretion: Secondary | ICD-10-CM | POA: Insufficient documentation

## 2015-11-09 DIAGNOSIS — R070 Pain in throat: Secondary | ICD-10-CM | POA: Insufficient documentation

## 2015-11-09 DIAGNOSIS — I10 Essential (primary) hypertension: Secondary | ICD-10-CM

## 2015-11-09 DIAGNOSIS — R208 Other disturbances of skin sensation: Secondary | ICD-10-CM

## 2015-11-09 DIAGNOSIS — R200 Anesthesia of skin: Secondary | ICD-10-CM

## 2015-11-09 DIAGNOSIS — I2510 Atherosclerotic heart disease of native coronary artery without angina pectoris: Secondary | ICD-10-CM

## 2015-11-09 NOTE — Patient Instructions (Signed)
Dr Debara Pickett recommends that you have a CT of your neck. This will be done at [facility]. We will call you with the results of this study.  Dr Debara Pickett recommends that you schedule a follow-up appointment in 1 year. You will receive a reminder letter in the mail two months in advance. If you don't receive a letter, please call our office to schedule the follow-up appointment.  If you need a refill on your cardiac medications before your next appointment, please call your pharmacy.

## 2015-11-09 NOTE — Progress Notes (Signed)
11/09/2015   PCP: Leotis Pain, DO   Chief Complaint  Patient presents with  . Annual Exam    pt c/o numbness and pain in neck, right upper arm, and around right ear    Primary Cardiologist:Dr. Alma Friendly   CC: Right facial numbness, drooling and right throat pain  HPI:  45 year old Female hx of hypertension, dyslipidemia, chronic tobacco use, chronic kidney disease stage III admitted with hypertensive urgency 09/13/14 and 6/10 chest pain EKG showed lateral T-wave inversions on admission and cardiology was consulted. It was felt she had unstable angina and she was placed on nitro drip.  Cardiac catheterization with 2.25X 12 mm Promus remainder DES to the M RCA on 09/14/14 for 90% stenosis. EF noted 65- 70%.  She did well and discharged without complication.    She was on numerous BP meds and her BP was running in the 90s to 106 and she felt very weak, lethargic.  The hydralazine was stopped and the cardizem.  She is feeling better.  She has occasional brief chest pressure feeling like the discomfort she was admitted with.  She is here today for follow-up. She is actually doing quite well her questions we discussed where the pneumonia vaccine she is only 38 and essentially in good health told her that was not needed unless she wanted it she does not want a flu vaccine she's had side effects in the past and refuses to take it. She is scheduled for sleep study January 29 2 [place] [facility]. She was recently seen by her primary care for cold symptoms and was placed on Zithromax.  She is feeling better.    She is asking about exercise which I recommended she could go back to to start slowly but work only a peripheral 1-2 weeks. She did not have a heart attack and her EF is normal.  Her mother was with her today who also has stents but the mother is or bare-metal stents. We discussed the difference between the 2 and the need for Plavix with both, and the length of time she would  need to take the Plavix.  She is no longer smoking. She is using the NicoDerm.  I saw Ms. Zackery back today in the office for follow-up. She denies any chest pain or worsening shortness of breath. She seems to be stable from a cardiac standpoint after her stent. She does however have an acute complaint. She tells me that about 2 weeks ago she had a viral upper respiratory infection and had a lot of discharge and pus. She has sore throat associated with this. Subsequently she felt a pop on the right side of her head. After that she had pain and numbness on the right side of her face on her right shoulder and down to the right upper arm and right upper back. She's also had difficulty swallowing due to pain particularly in the right side of her throat as well as some mild drooling. She denies any facial droop, right sided weakness, loss of consciousness, change in vision or other associated symptoms. She denies any headache. Blood pressure has been well controlled and is at goal today at 122/76. She was seen in urgent care and treated for acute pansinusitis by Dr. Elder Cyphers and given amoxicillin which she completed. Lab showed no elevated white count, normal renal function, her cholesterol profile was well controlled, and she had a low ESR 15  (which would likely exclude temporal  arteritis).  No Known Allergies  Current Outpatient Prescriptions  Medication Sig Dispense Refill  . amoxicillin (AMOXIL) 500 MG tablet Take 2 tablets (1,000 mg total) by mouth 2 (two) times daily. 40 tablet 0  . aspirin 81 MG chewable tablet Chew 1 tablet (81 mg total) by mouth daily.    Marland Kitchen atorvastatin (LIPITOR) 80 MG tablet Take 1 tablet (80 mg total) by mouth daily. 90 tablet 1  . fluticasone (FLONASE) 50 MCG/ACT nasal spray Place 2 sprays into both nostrils daily. 16 g 12  . metoprolol (LOPRESSOR) 50 MG tablet TAKE 1 TABLET BY MOUTH TWICE DAILY. NEED OFFICE VISIT FOR FURTHER REFILLS 60 tablet 4  . nicotine polacrilex (NICORETTE)  2 MG gum Take 1 each (2 mg total) by mouth as needed for smoking cessation (Take half for nicotine cravings that are uncontrolled otherwise.). 40 tablet 0  . triamterene-hydrochlorothiazide (DYAZIDE) 37.5-25 MG capsule TAKE 1 CAPSULE BY MOUTH EVERY DAY 30 capsule 4   No current facility-administered medications for this visit.    Past Medical History  Diagnosis Date  . Hypertension     > 5 years  . Chronic kidney disease     she sthinks has stage 2 CKD, has had US doppler and has some stenosis , this was done in [place]   . Hyperlipidemia   . ACS (acute coronary syndrome) ([place]) 09/13/2014    Unstable anginal pain s/p 2.25 x 12 mm Promus Premier DES to the RCA  . CAD in native artery   . Tobacco abuse 09/13/2014    Past Surgical History  Procedure Laterality Date  . Hernia repair    . Coronary angioplasty with stent placement      L main OK, LAD OK, CFX 30%, RCA 90%-0% w/ 2.25 x 12 mm Promus Premier DES, EF 55%  . Left heart catheterization with coronary angiogram N/A 09/14/2014    Procedure: LEFT HEART CATHETERIZATION WITH CORONARY ANGIOGRAM;  Surgeon: Burnell Blanks, MD;  Location: [facility] C[facility] LAB;  Service: Cardiovascular;  Laterality: N/A;  . Percutaneous coronary stent intervention (pci-s)  09/14/2014    Procedure: PERCUTANEOUS CORONARY STENT INTERVENTION (PCI-S);  Surgeon: Burnell Blanks, MD;  Location: [facility] CATH LAB;  Service: Cardiovascular;;  . Tonsillectomy  2005    10 years ago    NWG:NFAOZHY:QM colds or fevers, no weight changes, was feeling weak and tired with lower BP Skin:no rashes or ulcers HEENT:no blurred vision, no congestion CV:see HPI PUL:see HPI GI:no diarrhea constipation or melena, no indigestion GU:no hematuria, no dysuria MS:no joint pain, no claudication Neuro:no syncope, no lightheadedness Endo:no diabetes, no thyroid disease  Wt Readings from Last 3 Encounters:  11/09/15 166 lb (75.297 kg)  11/07/15 164 lb 9.6 oz (74.662 kg)    07/19/15 166 lb (75.297 kg)    PHYSICAL EXAM BP 122/76 mmHg  Pulse 66  Ht 5' 1.5" (1.562 m)  Wt 166 lb (75.297 kg)  BMI 30.86 kg/m2  LMP 11/07/2015 General:Pleasant affect, NAD Skin:Warm and dry, brisk capillary refill HEENT:normocephalic, sclera clear, mucus membranes moist Neck:supple, no JVD, no bruits . No adenopathy Heart:S1S2 RRR without murmur, gallup, rub or click Lungs:clear without rales, rhonchi, or wheezes VHQ:IONG, non tender, + BS, do not palpate liver spleen or masses Ext:no lower ext edema, 2+ pedal pulses, 2+ radial pulses Neuro:alert and oriented, MAE, follows commands, + facial symmetry  EKG:  Normal sinus rhythm at 66, nonspecific ST changes  ASSESSMENT: 1. Right-sided facial numbness, right throat pain and drooling 2. Coronary artery  disease status post PCI to the RCA in 2015 3. Dyslipidemia-at goal 4. Hypertension-controlled  PLAN: 1. Ms Soden seems stable from a cardiac standpoint. Her cholesterol is at goal. Her blood pressures controlled. She's having no anginal symptoms. Her main concern is that she is not improving after treatment for sinusitis/upper respiratory infection. She is now complaining of numbness across the right face into the right shoulder and upper right arm. She has pain in her right throat as well as difficulty swallowing and some drooling. I'm concerned about a peritonsillar or retropharyngeal abscess. Other possibilities could include a Bell's palsy although she does not have facial droop, early zoster (although she does not have any vesicles), or possibly sensory stroke although she has no motor findings. I like to get a CT with and without contrast of the neck to rule out the possibility of abscess. Her lab work a few weeks ago was reassuring. She's not had any further fevers. If the CT is unrevealing, I would recommend a referral to neurology for further evaluation.  Follow-up with me annually and her primary care provider in the near  future especially if her symptoms do not improve.  Pixie Casino, MD, [facility] Attending Cardiologist [place]

## 2015-11-11 ENCOUNTER — Encounter (HOSPITAL_COMMUNITY): Payer: Self-pay | Admitting: *Deleted

## 2015-11-11 ENCOUNTER — Telehealth: Payer: Self-pay | Admitting: Physician Assistant

## 2015-11-11 ENCOUNTER — Inpatient Hospital Stay (HOSPITAL_COMMUNITY)
Admission: EM | Admit: 2015-11-11 | Discharge: 2015-11-17 | DRG: 092 | Disposition: A | Discharge status: Home / Self Care | Payer: Federal, State, Local not specified - PPO | Attending: Internal Medicine | Admitting: Internal Medicine

## 2015-11-11 ENCOUNTER — Telehealth: Payer: Self-pay | Admitting: Internal Medicine

## 2015-11-11 ENCOUNTER — Ambulatory Visit (INDEPENDENT_AMBULATORY_CARE_PROVIDER_SITE_OTHER)
Admission: RE | Admit: 2015-11-11 | Discharge: 2015-11-11 | Disposition: A | Discharge status: Home / Self Care | Payer: Federal, State, Local not specified - PPO | Source: Ambulatory Visit | Attending: Internal Medicine | Admitting: Internal Medicine

## 2015-11-11 ENCOUNTER — Encounter: Payer: Self-pay | Admitting: Internal Medicine

## 2015-11-11 DIAGNOSIS — N189 Chronic kidney disease, unspecified: Secondary | ICD-10-CM

## 2015-11-11 DIAGNOSIS — M542 Cervicalgia: Secondary | ICD-10-CM

## 2015-11-11 DIAGNOSIS — R208 Other disturbances of skin sensation: Secondary | ICD-10-CM

## 2015-11-11 DIAGNOSIS — G959 Disease of spinal cord, unspecified: Secondary | ICD-10-CM | POA: Diagnosis not present

## 2015-11-11 DIAGNOSIS — K117 Disturbances of salivary secretion: Secondary | ICD-10-CM

## 2015-11-11 DIAGNOSIS — E785 Hyperlipidemia, unspecified: Secondary | ICD-10-CM

## 2015-11-11 DIAGNOSIS — R070 Pain in throat: Secondary | ICD-10-CM | POA: Diagnosis not present

## 2015-11-11 DIAGNOSIS — E860 Dehydration: Secondary | ICD-10-CM | POA: Diagnosis present

## 2015-11-11 DIAGNOSIS — R200 Anesthesia of skin: Secondary | ICD-10-CM

## 2015-11-11 DIAGNOSIS — Z79899 Other long term (current) drug therapy: Secondary | ICD-10-CM

## 2015-11-11 DIAGNOSIS — N182 Chronic kidney disease, stage 2 (mild): Secondary | ICD-10-CM | POA: Diagnosis present

## 2015-11-11 DIAGNOSIS — Z955 Presence of coronary angioplasty implant and graft: Secondary | ICD-10-CM

## 2015-11-11 DIAGNOSIS — G0491 Myelitis, unspecified: Secondary | ICD-10-CM

## 2015-11-11 DIAGNOSIS — Z87891 Personal history of nicotine dependence: Secondary | ICD-10-CM

## 2015-11-11 DIAGNOSIS — E876 Hypokalemia: Secondary | ICD-10-CM | POA: Diagnosis present

## 2015-11-11 DIAGNOSIS — I2510 Atherosclerotic heart disease of native coronary artery without angina pectoris: Secondary | ICD-10-CM

## 2015-11-11 DIAGNOSIS — Z8249 Family history of ischemic heart disease and other diseases of the circulatory system: Secondary | ICD-10-CM

## 2015-11-11 DIAGNOSIS — R531 Weakness: Secondary | ICD-10-CM | POA: Diagnosis present

## 2015-11-11 DIAGNOSIS — I10 Essential (primary) hypertension: Secondary | ICD-10-CM

## 2015-11-11 DIAGNOSIS — Z7982 Long term (current) use of aspirin: Secondary | ICD-10-CM

## 2015-11-11 DIAGNOSIS — R001 Bradycardia, unspecified: Secondary | ICD-10-CM | POA: Diagnosis present

## 2015-11-11 DIAGNOSIS — I129 Hypertensive chronic kidney disease with stage 1 through stage 4 chronic kidney disease, or unspecified chronic kidney disease: Secondary | ICD-10-CM | POA: Diagnosis present

## 2015-11-11 DIAGNOSIS — N179 Acute kidney failure, unspecified: Secondary | ICD-10-CM | POA: Diagnosis present

## 2015-11-11 DIAGNOSIS — R9389 Abnormal findings on diagnostic imaging of other specified body structures: Secondary | ICD-10-CM

## 2015-11-11 DIAGNOSIS — D869 Sarcoidosis, unspecified: Secondary | ICD-10-CM

## 2015-11-11 DIAGNOSIS — Z720 Tobacco use: Secondary | ICD-10-CM

## 2015-11-11 HISTORY — DX: Chronic kidney disease, stage 2 (mild): N18.2

## 2015-11-11 LAB — I-STAT CHEM 8, ED
BUN: 13 mg/dL (ref 6–20)
Calcium, Ion: 1.2 mmol/L (ref 1.12–1.23)
Chloride: 100 mmol/L — ABNORMAL LOW (ref 101–111)
Creatinine, Ser: 1.2 mg/dL — ABNORMAL HIGH (ref 0.44–1.00)
Glucose, Bld: 99 mg/dL (ref 65–99)
HCT: 36 % (ref 36.0–46.0)
Hemoglobin: 12.2 g/dL (ref 12.0–15.0)
Potassium: 3.3 mmol/L — ABNORMAL LOW (ref 3.5–5.1)
Sodium: 140 mmol/L (ref 135–145)
TCO2: 28 mmol/L (ref 0–100)

## 2015-11-11 MED ORDER — MORPHINE SULFATE (PF) 4 MG/ML IV SOLN
4.0000 mg | Freq: Once | INTRAVENOUS | Status: AC
  Administered 2015-11-11: 4 mg via INTRAVENOUS
  Filled 2015-11-11: qty 1

## 2015-11-11 MED ORDER — IOHEXOL 300 MG/ML  SOLN
75.0000 mL | Freq: Once | INTRAMUSCULAR | Status: AC | PRN
Start: 2015-11-11 — End: 2015-11-11
  Administered 2015-11-11: 75 mL via INTRAVENOUS

## 2015-11-11 NOTE — Telephone Encounter (Signed)
Paged by answering service,the patient continued to have right shoulder/arm numbness and pain. Office tried to reach her today to discuss result of CT of neck that was done today, however unable to do so.   FINDINGS  Skeleton: Mild endplate degenerative change in uncovertebral spurring is present at C5-6 and C6-7. Osseous foraminal narrowing is worse on the right at C5-6 and on the left at C6-7. There is reversal of the normal cervical lordosis.  Upper chest: The lung apices are clear.  IMPRESSION: 1. Mild prominence of the adenoid tissue and lingual tonsils without a discrete fluid collection or abscess. 2. Reactive type bilateral level 2 and level 3 lymph nodes. 3. The airway is patent. No focal mass lesion is evident.  She denies weakness of one side of body, difficulty speaking, vision changes, unable to control bowel, difficulty walking. Advised to go to ED for further evaluation. CT of neck showed worsen narrowing as above. Likely will need MRI of neck and neurology work up and consult.   The patient denies nausea, vomiting, fever, chest pain, palpitations, shortness of breath, orthopnea, PND, dizziness, syncope, cough, congestion, abdominal pain, hematochezia, melena, lower extremity edema.  Anna Henson, [place]

## 2015-11-11 NOTE — Telephone Encounter (Signed)
Ms.Aston is calling about her CT results , please call   Thanks

## 2015-11-11 NOTE — Telephone Encounter (Signed)
Pt had a CT of the neck this morning,she wants you to know she is still in a lot of pain. She also wants to know if you have received her results yet? She can not eat,having difficulty sleeping and headache,numbness is getting worse. She wonder if she should just go to the ER? Please call asap.

## 2015-11-11 NOTE — Telephone Encounter (Signed)
Attempted to reach pt, phone went unanswered. Routing to Dr. Debara Pickett for review of CT findings.

## 2015-11-11 NOTE — Telephone Encounter (Signed)
Paged by answering service stating Anna Henson was waiting for call back regarding results from earlier.  Multiple phone notes reviewed as well as CT which was relatively unrevealing.  Note from [place], [facility] states that she was advised to go to ED for further work up.  She is rather frustrated stated symptoms of pain and numbness are not better and she doesn't want to keep taking pain pills without improvement or knowing what is going on.  I advised that if this was the case, her best option at this time was to go to ED for further evaluation and management.

## 2015-11-11 NOTE — ED Provider Notes (Signed)
CSN: [id number]     Arrival date & time 11/11/15  2143 History   First MD Initiated Contact with Patient 11/11/15 2235     Chief Complaint  Patient presents with  . Numbness  . Neck Pain    Patient is a 45 y.o. female presenting with neck pain. The history is provided by the patient.  Neck Pain Pain location:  R side Quality:  Aching Pain radiates to:  R shoulder Pain severity:  Severe Onset quality:  Gradual Duration:  5 days Timing:  Constant Progression:  Worsening Chronicity:  New Relieved by:  Nothing Worsened by:  Position Associated symptoms: headaches, numbness and weakness   Associated symptoms: no bladder incontinence, no bowel incontinence, no chest pain, no fever and no visual change   Risk factors: no hx of spinal trauma and no recent head injury   pt reports last weekend she had onset of right sided ear pain and neck pain It radiates into right UE She now has diffuse numbness to right face/right ear/neck and right UE She reports right arm feels weak She reports mild HA No visual changes No h/o CVA  Reports seen at urgent care last weekend, told she had sinus infection and placed on amoxicillin Seen by her cardiologist this week for evaluation She had a CT soft tissue neck ordered at that time   Past Medical History  Diagnosis Date  . Hypertension     > 5 years  . Chronic kidney disease     she sthinks has stage 2 CKD, has had US doppler and has some stenosis , this was done in [place]   . Hyperlipidemia   . ACS (acute coronary syndrome) ([place]) 09/13/2014    Unstable anginal pain s/p 2.25 x 12 mm Promus Premier DES to the RCA  . CAD in native artery   . Tobacco abuse 09/13/2014   Past Surgical History  Procedure Laterality Date  . Hernia repair    . Coronary angioplasty with stent placement      L main OK, LAD OK, CFX 30%, RCA 90%-0% w/ 2.25 x 12 mm Promus Premier DES, EF 55%  . Left heart catheterization with coronary angiogram N/A 09/14/2014     Procedure: LEFT HEART CATHETERIZATION WITH CORONARY ANGIOGRAM;  Surgeon: Burnell Blanks, MD;  Location: [facility] CATH LAB;  Service: Cardiovascular;  Laterality: N/A;  . Percutaneous coronary stent intervention (pci-s)  09/14/2014    Procedure: PERCUTANEOUS CORONARY STENT INTERVENTION (PCI-S);  Surgeon: Burnell Blanks, MD;  Location: [facility] CATH LAB;  Service: Cardiovascular;;  . Tonsillectomy  2005    10 years ago   Family History  Problem Relation Age of Onset  . Diabetes Mother   . Heart disease Mother   . Hyperlipidemia Mother   . Hypertension Mother   . Lung cancer Father    Social History  Substance Use Topics  . Smoking status: Former Smoker -- 0.50 packs/day    Types: Cigarettes  . Smokeless tobacco: Never Used     Comment: on patch since 09/09/14  . Alcohol Use: No   OB History    No data available     Review of Systems  Constitutional: Negative for fever.  Eyes: Negative for visual disturbance.  Cardiovascular: Negative for chest pain.  Gastrointestinal: Negative for bowel incontinence.  Genitourinary: Negative for bladder incontinence.  Musculoskeletal: Positive for neck pain. Negative for back pain.  Neurological: Positive for weakness, numbness and headaches.  All other systems reviewed and are negative.  Allergies  Review of patient's allergies indicates no known allergies.  Home Medications   Prior to Admission medications   Medication Sig Start Date End Date Taking? Authorizing Provider  amoxicillin (AMOXIL) 500 MG tablet Take 2 tablets (1,000 mg total) by mouth 2 (two) times daily. 11/07/15   Wardell Honour, MD  aspirin 81 MG chewable tablet Chew 1 tablet (81 mg total) by mouth daily. 09/16/14   Nita Sells, MD  atorvastatin (LIPITOR) 80 MG tablet Take 1 tablet (80 mg total) by mouth daily. 08/30/15   Orma Flaming, MD  fluticasone (FLONASE) 50 MCG/ACT nasal spray Place 2 sprays into both nostrils daily. 11/07/15   Wardell Honour, MD   metoprolol (LOPRESSOR) 50 MG tablet TAKE 1 TABLET BY MOUTH TWICE DAILY. NEED OFFICE VISIT FOR FURTHER REFILLS 08/30/15   Orma Flaming, MD  nicotine polacrilex (NICORETTE) 2 MG gum Take 1 each (2 mg total) by mouth as needed for smoking cessation (Take half for nicotine cravings that are uncontrolled otherwise.). 11/08/14   Tereasa Coop, PA-C  triamterene-hydrochlorothiazide (DYAZIDE) 37.5-25 MG capsule TAKE 1 CAPSULE BY MOUTH EVERY DAY 08/30/15   Orma Flaming, MD   BP 128/71 mmHg  Pulse 55  Temp(Src) 98 F (36.7 C) (Oral)  Resp 16  Ht 5\' 1"  (1.549 m)  Wt 74.503 kg  BMI 31.05 kg/m2  SpO2 97%  LMP 11/07/2015 Physical Exam CONSTITUTIONAL: Well developed/well nourished HEAD: Normocephalic/atraumatic EYES: EOMI/PERRL ENMT: Mucous membranes moist, right TM appears dull in appearance. No mastoid crepitus NECK: supple no meningeal signs, no bruits No soft tissue swelling. ?mild lymphadenopathy SPINE/BACK:entire spine nontender, diffuse right sided cervical paraspinal tenderness CV: S1/S2 noted, no murmurs/rubs/gallops noted LUNGS: Lungs are clear to auscultation bilaterally, no apparent distress ABDOMEN: soft, nontender, no rebound or guarding, bowel sounds noted throughout abdomen GU:no cva tenderness NEURO: Pt is awake/alert/appropriate, moves all extremitiesx4.  No facial droop.  No arm/leg drift Equal power (5/5) with hand grip, wrist flex/extension, elbow flex/extension, and equal power with shoulder abduction/adduction.  She reports diffuse numbness to entire right arm.  She also has numbness to right ear/face.  EXTREMITIES: pulses normal/equal, full ROM SKIN: warm, color normal PSYCH: no abnormalities of mood noted, alert and oriented to situation  ED Course  Procedures  11:42 PM Pt with right UE numbness and reported weakness (no weakness on exam) with also having right facial numbness She has neck pain but Cervical radiculopathy would not explain her facial numbness.   Unclear cause of her neuro symptoms Due to progressive symptoms/exam, will proceed with MR imaging of cspine/brain 12:45 AM Signed out to shari upstill to f/u on MRI Labs Review Labs Reviewed  I-STAT CHEM 8, ED - Abnormal; Notable for the following:    Potassium 3.3 (*)    Chloride 100 (*)    Creatinine, Ser 1.20 (*)    All other components within normal limits    Imaging Review Ct Soft Tissue Neck W Contrast  11/11/2015  CLINICAL DATA:  Right facial numbness. Throat pain and drooling. The patient has been on antibiotics for 5 days without relief. EXAM: CT NECK WITH CONTRAST TECHNIQUE: Multidetector CT imaging of the neck was performed using the standard protocol following the bolus administration of intravenous contrast. CONTRAST:  73mL OMNIPAQUE IOHEXOL 300 MG/ML  SOLN COMPARISON:  None. FINDINGS: Pharynx and larynx: With there is moderate prominence of the adenoid tissue on lingual tonsils bilaterally. The mild prominence the palatine tonsils. No discrete abscess is or fluid collection is  present. The epiglottis is within normal limits. Salivary glands: The submandibular and parotid glands are within normal limits bilaterally. Thyroid: Within normal limits. Lymph nodes: Bilateral sub cm reactive type lymph nodes are present. Vascular: Atherosclerotic calcifications are present at the carotid bifurcations bilaterally without significant stenosis. Limited intracranial: Within normal limits Visualized orbits: Not imaged Mastoids and visualized paranasal sinuses: Clear Skeleton: Mild endplate degenerative change in uncovertebral spurring is present at C5-6 and C6-7. Osseous foraminal narrowing is worse on the right at C5-6 and on the left at C6-7. There is reversal of the normal cervical lordosis. Upper chest: The lung apices are clear. IMPRESSION: 1. Mild prominence of the adenoid tissue and lingual tonsils without a discrete fluid collection or abscess. 2. Reactive type bilateral level 2 and level 3  lymph nodes. 3. The airway is patent.  No focal mass lesion is evident. Electronically Signed   By: San Morelle M.D.   On: 11/11/2015 11:58   I have personally reviewed and evaluated these lab results as part of my medical decision-making.   Medications  morphine 4 MG/ML injection 4 mg (4 mg Intravenous Given 11/11/15 2344)  LORazepam (ATIVAN) tablet 1 mg (1 mg Oral Given 11/12/15 0012)    MDM   Final diagnoses:  Neck pain  Right arm numbness  Facial numbness    Nursing notes including past medical history and social history reviewed and considered in documentation Labs/vital reviewed myself and considered during evaluation Previous records reviewed and considered     Ripley Fraise, MD 11/12/15 0045

## 2015-11-11 NOTE — ED Notes (Signed)
Pt admits to rt facial and arm numbness x1 week w/ associated neck pain - pt was seen by her physician who did a scan of her neck which showed abnormalities w/ her lymph nodes however did not explain the numbness so pt was referred to ER for further evaluation. Pt admits to noting some rt arm weakness that has gotten progressively worse since Tuesday.

## 2015-11-12 ENCOUNTER — Inpatient Hospital Stay (HOSPITAL_COMMUNITY): Payer: Federal, State, Local not specified - PPO

## 2015-11-12 ENCOUNTER — Emergency Department (HOSPITAL_COMMUNITY): Payer: Federal, State, Local not specified - PPO

## 2015-11-12 ENCOUNTER — Encounter (HOSPITAL_COMMUNITY): Payer: Self-pay | Admitting: Internal Medicine

## 2015-11-12 DIAGNOSIS — E876 Hypokalemia: Secondary | ICD-10-CM | POA: Diagnosis present

## 2015-11-12 DIAGNOSIS — I10 Essential (primary) hypertension: Secondary | ICD-10-CM

## 2015-11-12 DIAGNOSIS — R200 Anesthesia of skin: Secondary | ICD-10-CM | POA: Diagnosis present

## 2015-11-12 DIAGNOSIS — I169 Hypertensive crisis, unspecified: Secondary | ICD-10-CM | POA: Insufficient documentation

## 2015-11-12 DIAGNOSIS — N1831 Chronic kidney disease, stage 3a: Secondary | ICD-10-CM | POA: Insufficient documentation

## 2015-11-12 DIAGNOSIS — Z87891 Personal history of nicotine dependence: Secondary | ICD-10-CM | POA: Diagnosis not present

## 2015-11-12 DIAGNOSIS — I2510 Atherosclerotic heart disease of native coronary artery without angina pectoris: Secondary | ICD-10-CM | POA: Insufficient documentation

## 2015-11-12 DIAGNOSIS — Z955 Presence of coronary angioplasty implant and graft: Secondary | ICD-10-CM | POA: Diagnosis not present

## 2015-11-12 DIAGNOSIS — R9389 Abnormal findings on diagnostic imaging of other specified body structures: Secondary | ICD-10-CM | POA: Diagnosis present

## 2015-11-12 DIAGNOSIS — R531 Weakness: Secondary | ICD-10-CM | POA: Diagnosis present

## 2015-11-12 DIAGNOSIS — R938 Abnormal findings on diagnostic imaging of other specified body structures: Secondary | ICD-10-CM | POA: Diagnosis not present

## 2015-11-12 DIAGNOSIS — I129 Hypertensive chronic kidney disease with stage 1 through stage 4 chronic kidney disease, or unspecified chronic kidney disease: Secondary | ICD-10-CM | POA: Diagnosis present

## 2015-11-12 DIAGNOSIS — E860 Dehydration: Secondary | ICD-10-CM | POA: Diagnosis present

## 2015-11-12 DIAGNOSIS — E785 Hyperlipidemia, unspecified: Secondary | ICD-10-CM | POA: Diagnosis present

## 2015-11-12 DIAGNOSIS — N179 Acute kidney failure, unspecified: Secondary | ICD-10-CM | POA: Diagnosis present

## 2015-11-12 DIAGNOSIS — R001 Bradycardia, unspecified: Secondary | ICD-10-CM | POA: Diagnosis present

## 2015-11-12 DIAGNOSIS — M542 Cervicalgia: Secondary | ICD-10-CM | POA: Diagnosis present

## 2015-11-12 DIAGNOSIS — Z7982 Long term (current) use of aspirin: Secondary | ICD-10-CM | POA: Diagnosis not present

## 2015-11-12 DIAGNOSIS — Z79899 Other long term (current) drug therapy: Secondary | ICD-10-CM | POA: Diagnosis not present

## 2015-11-12 DIAGNOSIS — G959 Disease of spinal cord, unspecified: Secondary | ICD-10-CM | POA: Diagnosis present

## 2015-11-12 DIAGNOSIS — Z8249 Family history of ischemic heart disease and other diseases of the circulatory system: Secondary | ICD-10-CM | POA: Diagnosis not present

## 2015-11-12 DIAGNOSIS — N189 Chronic kidney disease, unspecified: Secondary | ICD-10-CM

## 2015-11-12 DIAGNOSIS — N182 Chronic kidney disease, stage 2 (mild): Secondary | ICD-10-CM | POA: Diagnosis present

## 2015-11-12 LAB — COMPREHENSIVE METABOLIC PANEL
ALT: 28 U/L (ref 14–54)
AST: 30 U/L (ref 15–41)
Albumin: 3.6 g/dL (ref 3.5–5.0)
Alkaline Phosphatase: 51 U/L (ref 38–126)
Anion gap: 12 (ref 5–15)
BUN: 12 mg/dL (ref 6–20)
CO2: 25 mmol/L (ref 22–32)
Calcium: 9.3 mg/dL (ref 8.9–10.3)
Chloride: 103 mmol/L (ref 101–111)
Creatinine, Ser: 1.25 mg/dL — ABNORMAL HIGH (ref 0.44–1.00)
GFR calc Af Amer: >60 mL/min (ref >60)
GFR calc non Af Amer: 52 mL/min — ABNORMAL LOW (ref >60)
Glucose, Bld: 107 mg/dL — ABNORMAL HIGH (ref 65–99)
Potassium: 3.7 mmol/L (ref 3.5–5.1)
Sodium: 140 mmol/L (ref 135–145)
Total Bilirubin: 0.5 mg/dL (ref 0.3–1.2)
Total Protein: 6.2 g/dL — ABNORMAL LOW (ref 6.5–8.1)

## 2015-11-12 LAB — CBC
HCT: 36 % (ref 36.0–46.0)
Hemoglobin: 12.5 g/dL (ref 12.0–15.0)
MCH: 31.7 pg (ref 26.0–34.0)
MCHC: 34.7 g/dL (ref 30.0–36.0)
MCV: 91.4 fL (ref 78.0–100.0)
Platelets: 331 10*3/uL (ref 150–400)
RBC: 3.94 MIL/uL (ref 3.87–5.11)
RDW: 14.1 % (ref 11.5–15.5)
WBC: 7 10*3/uL (ref 4.0–10.5)

## 2015-11-12 LAB — SODIUM, URINE, RANDOM: Sodium, Ur: 58 mmol/L

## 2015-11-12 LAB — CREATININE, URINE, RANDOM: Creatinine, Urine: 319.33 mg/dL

## 2015-11-12 LAB — HIV ANTIBODY (ROUTINE TESTING W REFLEX): HIV Screen 4th Generation wRfx: NONREACTIVE

## 2015-11-12 LAB — PROTIME-INR
INR: 1.03 (ref 0.00–1.49)
Prothrombin Time: 13.7 s (ref 11.6–15.2)

## 2015-11-12 LAB — VITAMIN B12: Vitamin B-12: 541 pg/mL (ref 180–914)

## 2015-11-12 LAB — FOLATE: Folate: 15.6 ng/mL (ref >5.9)

## 2015-11-12 LAB — GLUCOSE, CAPILLARY
Glucose-Capillary: 102 mg/dL — ABNORMAL HIGH (ref 65–99)
Glucose-Capillary: 102 mg/dL — ABNORMAL HIGH (ref 65–99)

## 2015-11-12 LAB — APTT: aPTT: 26 s (ref 24–37)

## 2015-11-12 LAB — HCG, QUANTITATIVE, PREGNANCY: hCG, Beta Chain, Quant, S: <1 m[IU]/mL (ref <5)

## 2015-11-12 LAB — MAGNESIUM: Magnesium: 2.1 mg/dL (ref 1.7–2.4)

## 2015-11-12 MED ORDER — ATORVASTATIN CALCIUM 80 MG PO TABS
80.0000 mg | ORAL_TABLET | Freq: Every day | ORAL | Status: DC
  Administered 2015-11-12 – 2015-11-17 (×6): 80 mg via ORAL
  Filled 2015-11-12 (×6): qty 1

## 2015-11-12 MED ORDER — ALUM & MAG HYDROXIDE-SIMETH 200-200-20 MG/5ML PO SUSP: 30.0000 mL | Freq: Four times a day (QID) | ORAL | Status: DC | PRN

## 2015-11-12 MED ORDER — LORAZEPAM 1 MG PO TABS
1.0000 mg | ORAL_TABLET | Freq: Once | ORAL | Status: AC
  Administered 2015-11-12: 1 mg via ORAL
  Filled 2015-11-12: qty 1

## 2015-11-12 MED ORDER — ONDANSETRON HCL 4 MG/2ML IJ SOLN
4.0000 mg | Freq: Three times a day (TID) | INTRAMUSCULAR | Status: DC | PRN
Start: 2015-11-12 — End: 2015-11-17
  Administered 2015-11-12 (×2): 4 mg via INTRAVENOUS
  Filled 2015-11-12 (×2): qty 2

## 2015-11-12 MED ORDER — POTASSIUM CHLORIDE CRYS ER 20 MEQ PO TBCR
20.0000 meq | EXTENDED_RELEASE_TABLET | Freq: Once | ORAL | Status: AC
  Administered 2015-11-12: 20 meq via ORAL
  Filled 2015-11-12: qty 1

## 2015-11-12 MED ORDER — ACETAMINOPHEN 325 MG PO TABS
650.0000 mg | ORAL_TABLET | Freq: Four times a day (QID) | ORAL | Status: DC | PRN
  Administered 2015-11-16 – 2015-11-17 (×4): 650 mg via ORAL
  Filled 2015-11-12 (×5): qty 2

## 2015-11-12 MED ORDER — OXYCODONE-ACETAMINOPHEN 5-325 MG PO TABS
2.0000 | ORAL_TABLET | Freq: Four times a day (QID) | ORAL | Status: DC | PRN
  Administered 2015-11-12: 2 via ORAL
  Filled 2015-11-12 (×2): qty 2

## 2015-11-12 MED ORDER — SODIUM CHLORIDE 0.9 % IV SOLN
INTRAVENOUS | Status: DC
  Administered 2015-11-12 – 2015-11-13 (×3): via INTRAVENOUS

## 2015-11-12 MED ORDER — SODIUM CHLORIDE 0.9% FLUSH
3.0000 mL | Freq: Two times a day (BID) | INTRAVENOUS | Status: DC
  Administered 2015-11-12: 10 mL via INTRAVENOUS
  Administered 2015-11-12 – 2015-11-14 (×5): 3 mL via INTRAVENOUS
  Administered 2015-11-15: 10 mL via INTRAVENOUS
  Administered 2015-11-15 – 2015-11-17 (×4): 3 mL via INTRAVENOUS

## 2015-11-12 MED ORDER — GADOBENATE DIMEGLUMINE 529 MG/ML IV SOLN
15.0000 mL | Freq: Once | INTRAVENOUS | Status: AC | PRN
  Administered 2015-11-12: 15 mL via INTRAVENOUS

## 2015-11-12 MED ORDER — ACETAMINOPHEN 650 MG RE SUPP: 650.0000 mg | Freq: Four times a day (QID) | RECTAL | Status: DC | PRN

## 2015-11-12 MED ORDER — METOPROLOL TARTRATE 50 MG PO TABS
50.0000 mg | ORAL_TABLET | Freq: Two times a day (BID) | ORAL | Status: DC
  Administered 2015-11-12 – 2015-11-17 (×11): 50 mg via ORAL
  Filled 2015-11-12 (×10): qty 1

## 2015-11-12 MED ORDER — SODIUM CHLORIDE 0.9 % IV SOLN
500.0000 mg | Freq: Two times a day (BID) | INTRAVENOUS | Status: AC
  Administered 2015-11-12 – 2015-11-17 (×11): 500 mg via INTRAVENOUS
  Filled 2015-11-12 (×11): qty 4

## 2015-11-12 MED ORDER — HYDRALAZINE HCL 20 MG/ML IJ SOLN
5.0000 mg | INTRAMUSCULAR | Status: DC | PRN
  Administered 2015-11-12 – 2015-11-17 (×4): 5 mg via INTRAVENOUS
  Filled 2015-11-12 (×4): qty 1

## 2015-11-12 MED ORDER — MORPHINE SULFATE (PF) 2 MG/ML IV SOLN
2.0000 mg | INTRAVENOUS | Status: DC | PRN
  Administered 2015-11-12 – 2015-11-16 (×20): 2 mg via INTRAVENOUS
  Filled 2015-11-12 (×21): qty 1

## 2015-11-12 MED ORDER — ASPIRIN 81 MG PO CHEW
81.0000 mg | CHEWABLE_TABLET | Freq: Every day | ORAL | Status: DC
  Administered 2015-11-12: 81 mg via ORAL
  Filled 2015-11-12 (×2): qty 1

## 2015-11-12 NOTE — Progress Notes (Signed)
See note from Dr. Nicole Kindred this morning.   Impression: 45 yo F with inflammatory cord changes as evidenced by enhancement. 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   Differential diagnosis includes multiple sclerosis, and neuromyelitis optica, infectious myelitis, autoimmune disease, metabolic myelopathy.   I think infectious is very unlikley with no fever or other signs of infection. Onset is fairly abrupt for a metabolic process but the subacute onset is not consistent with cord infarct.   1) NMO IgG from serum 2) autoimmune evaluation with ANA, serum ACE, SSA, SSB,  anca 3) CSF evaluation for cell count and differential, protein, glucose, oligoclonal bands, IgG index.  4) HIV, if immune compromised then further infectious workup 5) Solumedrol 500mg  BID   Roland Rack, MD [facility] [phone]  If 7pm- 7am, please page neurology on call as listed in [place].

## 2015-11-12 NOTE — Telephone Encounter (Signed)
Left message for patient to return call.

## 2015-11-12 NOTE — Progress Notes (Signed)
Attempted to get report. ED nurse is to call back.

## 2015-11-12 NOTE — Progress Notes (Signed)
Received report on patient from Mikki Santee, [place] from ED. Awaiting patient to be transferred to 5W.

## 2015-11-12 NOTE — Telephone Encounter (Signed)
CT does not show abscess .. there is narrowing of the cervical spine more on the right .. may explain symptoms. Looks as if she was directed to the ER last night for ongoing pain.  Dr. Lemmie Evens

## 2015-11-12 NOTE — Progress Notes (Signed)
NURSING PROGRESS NOTE  [name]  MRN: [id number]  Admission Data: 11/12/2015 5:08 AM  Attending Provider: Ivor Costa, MD  PCP: Leotis Pain, DO  Code status: FULL  Allergies: No Known Allergies   Past Medical History:  has a past medical history of Hypertension; Chronic kidney disease; Hyperlipidemia; ACS (acute coronary syndrome) ([place]) (09/13/2014); CAD in native artery; Tobacco abuse (09/13/2014); and CKD (chronic kidney disease), stage II.   Past Surgical History:  has past surgical history that includes Hernia repair; Coronary angioplasty with stent; left heart catheterization with coronary angiogram (N/A, 09/14/2014); percutaneous coronary stent intervention (pci-s) (09/14/2014); and Tonsillectomy (2005).   Anna Henson is a 45 y.o. female patient, arrived to floor in room [phone] via stretcher, transferred from ED. Patient alert and oriented X 4. No acute distress noted. Denies pain.   Vital signs: Oral temperature 98 F (36.7 C), Blood pressure 157/84, Pulse 80, RR 20, SpO2 99 % on room air. Height 5'1" ( 154.9 cm), weight 162 lbs (73.6 kg).   Cardiac monitoring: Telemetry box 5W #  27 in place, verified with Tammy NT.  IV access: Right antecubital; condition patent and no redness.  Skin: intact, no pressure ulcer noted in sacral area.   Patient's ID armband verified with patient/ family, and in place. Information packet given to patient/ family. Fall risk assessed, SR up X2, patient/ family able to verbalize understanding of risks associated with falls and to call nurse or staff to assist before getting out of bed. Patient/ family oriented to room and equipment. Call bell within reach.

## 2015-11-12 NOTE — ED Notes (Signed)
Patient transported to MRI 

## 2015-11-12 NOTE — ED Provider Notes (Signed)
Patient in shared visit with Dr. Christy Gentles.  Arrives with right face and UE numbness without weakness. Symptoms started x 5 days ago, persist today, some worsening. Pending MR brain to r/o stroke. If negative, she can be discharged home.   MR results called to me by Dr. Dorann Lodge: she has an enhancing lesion proximal cervical spine. DDx: mass vs subacute infarct vs atypical demyelinization process. Discussed with Dr. Nicole Kindred, who will provide neuro consultation. Discussed with Dr. Blaine Hamper who accepts the patient for admission for further management and treatment. Results provided to the patient, all questions answered. She is agreeable to admission.  Charlann Lange, PA-C 11/12/15 [id number]  Ripley Fraise, MD 11/13/15 [phone]

## 2015-11-12 NOTE — Evaluation (Signed)
Physical Therapy Evaluation Patient Details Name: Anna Henson MRN: [id number] DOB: 1971-01-04 Today's Date: 11/12/2015   History of Present Illness  Pt adm with numbness of RUE, face, and lt shoulder. MRI of head negative but MRI of cervical spine showed edema and demyelination of the cervical spine. Workup in progress for MS vs transverse myelitis.  Clinical Impression  Pt's mobility is not quite baseline but is adequate for return home. Pt to continue mobilizing on her own while here.    Follow Up Recommendations No PT follow up    Equipment Recommendations  None recommended by PT    Recommendations for Other Services       Precautions / Restrictions Precautions Precautions: None Restrictions Weight Bearing Restrictions: No      Mobility  Bed Mobility Overal bed mobility: Independent                Transfers Overall transfer level: Independent                  Ambulation/Gait Ambulation/Gait assistance: Modified independent (Device/Increase time) Ambulation Distance (Feet): 600 Feet Assistive device: None Gait Pattern/deviations: Step-through pattern;Decreased stride length Gait velocity: below her normal   General Gait Details: Steady gait but slightly guarded. Able to look side to side, up/down, and make turns without difficulty  Stairs Stairs: Yes Stairs assistance: Modified independent (Device/Increase time) Stair Management: One rail Right;Alternating pattern;Step to pattern;Forwards Number of Stairs: 3    Wheelchair Mobility    Modified Rankin (Stroke Patients Only)       Balance Overall balance assessment: Modified Independent                                           Pertinent Vitals/Pain      Home Living Family/patient expects to be discharged to:: Private residence Living Arrangements: Alone Available Help at Discharge: Family;Available PRN/intermittently (Mother lives in same complex and boyfriend  present frequentl) Type of Home: Apartment Home Access: Stairs to enter Entrance Stairs-Rails: Right Entrance Stairs-Number of Steps: 1 flight Home Layout: One level Home Equipment: None      Prior Function Level of Independence: Independent               Hand Dominance   Dominant Hand: Right    Extremity/Trunk Assessment   Upper Extremity Assessment: Defer to OT evaluation           Lower Extremity Assessment: Overall WFL for tasks assessed         Communication   Communication: No difficulties  Cognition Arousal/Alertness: Awake/alert Behavior During Therapy: WFL for tasks assessed/performed Overall Cognitive Status: Within Functional Limits for tasks assessed                      General Comments      Exercises        Assessment/Plan    PT Assessment Patent does not need any further PT services  PT Diagnosis Difficulty walking   PT Problem List    PT Treatment Interventions     PT Goals (Current goals can be found in the Care Plan section) Acute Rehab PT Goals PT Goal Formulation: All assessment and education complete, DC therapy    Frequency     Barriers to discharge        Co-evaluation  End of Session   Activity Tolerance: Patient tolerated treatment well Patient left: in bed;with call bell/phone within reach;Other (comment) (with MD present) Nurse Communication: Mobility status         Time: [id number] PT Time Calculation (min) (ACUTE ONLY): 14 min   Charges:   PT Evaluation $PT Eval Low Complexity: 1 Procedure     PT G Codes:        Anna Henson 2015/12/03, 10:49 AM  [facility] PT [phone]

## 2015-11-12 NOTE — Progress Notes (Signed)
OT Cancellation Note  Patient Details Name: Anna Henson MRN: [id number] DOB: 1971-09-21   Cancelled Treatment:    Reason Eval/Treat Not Completed: Medical issues which prohibited therapy -- RN reports patient needs to be on flat bedrest x 4 hours s/p lumbar puncture.  Kendyn Zaman A 11/12/2015, 12:03 PM

## 2015-11-12 NOTE — H&P (Signed)
[facility] History and Physical  Anna Henson [id number] DOB: 10-21-1970 DOA: 11/11/2015  Referring physician: ED physician PCP: Leotis Pain, DO  Specialists:   Chief Complaint: Numbness over right face and arm and right neck pain  HPI: Anna Henson is a 45 y.o. female with PMH of hypertension, hyperlipidemia, CAD, as set up a stent placement, chronic kidney disease-stage II, who presents with numbness over right face and arm.  Patient reports that she has been having numbness over right face and arm for about a week, which has been progressively getting worse. She has mild weakness in left arm and hand. She also has right sided neck pain. She does not have vision change, hearing loss, numbness or weakness in her legs. Patient does not have chest pain, shortness of breath, symptoms of UTI.  Patient reports that she was seen by her doctor, who started patient with amoxicillin on 1/29. Patient has been taking thid medications, but without any improvement. Today she had CT-neck which showed mild prominence of the adenoid tissue and lingual tonsils without discrete fluid collection or abscess and reactive type bilateral level 2 and level 3 lymph nodes.   In ED, patient was found to have WBC 10.1, temperature normal, bradycardia, potassium 3.3, worsening renal function, negative pregnancy test. MRI of head and c spin showed 7 mm enhancing lesion involving the right dorsal column at the proximal cervical spine with edema extending down to C4-5. Lesion did not appear to be typical of demyelination, nor tumor. Patient is admitted to inpatient for further eval and treatment. Neurology was consulted.  EKG: Not done in ED, will get one.   Where does patient live?   At home    Can patient participate in ADLs?  Yes    Review of Systems:   General: no fevers, chills, no changes in body weight, has fatigue HEENT: no blurry vision, hearing changes or sore throat. Has right neck pain. Pulm: no  dyspnea, coughing, wheezing CV: no chest pain, palpitations Abd: no nausea, vomiting, abdominal pain, diarrhea, constipation GU: no dysuria, burning on urination, increased urinary frequency, hematuria  Ext: no leg edema Neuro: has numbness over R face and arm. No vision change or hearing loss Skin: no rash MSK: No muscle spasm, no deformity, no limitation of range of movement in spin Heme: No easy bruising.  Travel history: No recent long distant travel.  Allergy: No Known Allergies  Past Medical History  Diagnosis Date  . Hypertension     > 5 years  . Chronic kidney disease     she sthinks has stage 2 CKD, has had US doppler and has some stenosis , this was done in [place]   . Hyperlipidemia   . ACS (acute coronary syndrome) ([place]) 09/13/2014    Unstable anginal pain s/p 2.25 x 12 mm Promus Premier DES to the RCA  . CAD in native artery   . Tobacco abuse 09/13/2014  . CKD (chronic kidney disease), stage II     Past Surgical History  Procedure Laterality Date  . Hernia repair    . Coronary angioplasty with stent placement      L main OK, LAD OK, CFX 30%, RCA 90%-0% w/ 2.25 x 12 mm Promus Premier DES, EF 55%  . Left heart catheterization with coronary angiogram N/A 09/14/2014    Procedure: LEFT HEART CATHETERIZATION WITH CORONARY ANGIOGRAM;  Surgeon: Burnell Blanks, MD;  Location: [facility] CATH LAB;  Service: Cardiovascular;  Laterality: N/A;  . Percutaneous coronary  stent intervention (pci-s)  09/14/2014    Procedure: PERCUTANEOUS CORONARY STENT INTERVENTION (PCI-S);  Surgeon: Burnell Blanks, MD;  Location: [facility] CATH LAB;  Service: Cardiovascular;;  . Tonsillectomy  2005    10 years ago    Social History:  reports that she has quit smoking. Her smoking use included Cigarettes. She smoked 0.50 packs per day. She has never used smokeless tobacco. She reports that she does not drink alcohol or use illicit drugs.  Family History:  Family History  Problem Relation Age  of Onset  . Diabetes Mother   . Heart disease Mother   . Hyperlipidemia Mother   . Hypertension Mother   . Lung cancer Father      Prior to Admission medications   Medication Sig Start Date End Date Taking? Authorizing Provider  amoxicillin (AMOXIL) 500 MG tablet Take 2 tablets (1,000 mg total) by mouth 2 (two) times daily. 11/07/15   Wardell Honour, MD  aspirin 81 MG chewable tablet Chew 1 tablet (81 mg total) by mouth daily. 09/16/14   Nita Sells, MD  atorvastatin (LIPITOR) 80 MG tablet Take 1 tablet (80 mg total) by mouth daily. 08/30/15   Orma Flaming, MD  fluticasone (FLONASE) 50 MCG/ACT nasal spray Place 2 sprays into both nostrils daily. 11/07/15   Wardell Honour, MD  metoprolol (LOPRESSOR) 50 MG tablet TAKE 1 TABLET BY MOUTH TWICE DAILY. NEED OFFICE VISIT FOR FURTHER REFILLS 08/30/15   Orma Flaming, MD  nicotine polacrilex (NICORETTE) 2 MG gum Take 1 each (2 mg total) by mouth as needed for smoking cessation (Take half for nicotine cravings that are uncontrolled otherwise.). 11/08/14   Tereasa Coop, PA-C  triamterene-hydrochlorothiazide (DYAZIDE) 37.5-25 MG capsule TAKE 1 CAPSULE BY MOUTH EVERY DAY 08/30/15   Orma Flaming, MD    Physical Exam: Filed Vitals:   11/12/15 0400 11/12/15 0454 11/12/15 0456 11/12/15 0505  BP: 140/84 167/105 157/84   Pulse: 66 89 80   Temp:  98 F (36.7 C)    TempSrc:  Oral    Resp:  20    Height:    5\' 1"  (1.549 m)  Weight:    73.6 kg (162 lb 4.1 oz)  SpO2: 98% 98% 99%    General: Not in acute distress HEENT:       Eyes: PERRL, EOMI, no scleral icterus.       ENT: No discharge from the ears and nose, no pharynx injection, no tonsillar enlargement.        Neck: No JVD, no bruit, no mass felt. Heme: No neck lymph node enlargement. Cardiac: S1/S2, RRR, No murmurs, No gallops or rubs. Pulm: No rales, wheezing, rhonchi or rubs. Abd: Soft, nondistended, nontender, no rebound pain, no organomegaly, BS present. Ext: No pitting leg edema  bilaterally. 2+DP/PT pulse bilaterally. Musculoskeletal: No joint deformities, No joint redness or warmth, no limitation of ROM in spin. Skin: No rashes.  Neuro: Alert, oriented X3, cranial nerves II-XII grossly intact, muscle strength 5/5 in all extremities, has decreased sensation to light touch in the right face and arm and hand. Knee reflex 1+ bilaterally. Negative Babinski's sign. Normal finger to nose test. Psych: Patient is not psychotic, no suicidal or hemocidal ideation.  Labs on Admission:  Basic Metabolic Panel:  Recent Labs Lab 11/11/15 2330 11/12/15 0351  NA 140 140  K 3.3* 3.7  CL 100* 103  CO2  --  25  GLUCOSE 99 107*  BUN 13 12  CREATININE 1.20* 1.25*  CALCIUM  --  9.3  MG  --  2.1   Liver Function Tests:  Recent Labs Lab 11/12/15 0351  AST 30  ALT 28  ALKPHOS 51  BILITOT 0.5  PROT 6.2*  ALBUMIN 3.6   No results for input(s): LIPASE, AMYLASE in the last 168 hours. No results for input(s): AMMONIA in the last 168 hours. CBC:  Recent Labs Lab 11/07/15 0931 11/11/15 2330 11/12/15 0351  WBC 10.1  --  7.0  HGB 12.6 12.2 12.5  HCT 34.9* 36.0 36.0  MCV 90.3  --  91.4  PLT  --   --  331   Cardiac Enzymes: No results for input(s): CKTOTAL, CKMB, CKMBINDEX, TROPONINI in the last 168 hours.  BNP (last 3 results) No results for input(s): BNP in the last 8760 hours.  ProBNP (last 3 results) No results for input(s): PROBNP in the last 8760 hours.  CBG:  Recent Labs Lab 11/12/15 0501  GLUCAP 102*    Radiological Exams on Admission: Ct Soft Tissue Neck W Contrast  11/11/2015  CLINICAL DATA:  Right facial numbness. Throat pain and drooling. The patient has been on antibiotics for 5 days without relief. EXAM: CT NECK WITH CONTRAST TECHNIQUE: Multidetector CT imaging of the neck was performed using the standard protocol following the bolus administration of intravenous contrast. CONTRAST:  30mL OMNIPAQUE IOHEXOL 300 MG/ML  SOLN COMPARISON:  None.  FINDINGS: Pharynx and larynx: With there is moderate prominence of the adenoid tissue on lingual tonsils bilaterally. The mild prominence the palatine tonsils. No discrete abscess is or fluid collection is present. The epiglottis is within normal limits. Salivary glands: The submandibular and parotid glands are within normal limits bilaterally. Thyroid: Within normal limits. Lymph nodes: Bilateral sub cm reactive type lymph nodes are present. Vascular: Atherosclerotic calcifications are present at the carotid bifurcations bilaterally without significant stenosis. Limited intracranial: Within normal limits Visualized orbits: Not imaged Mastoids and visualized paranasal sinuses: Clear Skeleton: Mild endplate degenerative change in uncovertebral spurring is present at C5-6 and C6-7. Osseous foraminal narrowing is worse on the right at C5-6 and on the left at C6-7. There is reversal of the normal cervical lordosis. Upper chest: The lung apices are clear. IMPRESSION: 1. Mild prominence of the adenoid tissue and lingual tonsils without a discrete fluid collection or abscess. 2. Reactive type bilateral level 2 and level 3 lymph nodes. 3. The airway is patent.  No focal mass lesion is evident. Electronically Signed   By: San Morelle M.D.   On: 11/11/2015 11:58   Mr Brain Wo Contrast  11/12/2015  CLINICAL DATA:  RIGHT facial and arm numbness for 1 week associated with neck pain. History of prominent lymph nodes in the neck, hypertension, hyperlipidemia. EXAM: MRI HEAD WITHOUT CONTRAST MRI CERVICAL SPINE WITHOUT AND WITH CONTRAST TECHNIQUE: Multiplanar, multiecho pulse sequences of the brain and surrounding structures were obtained without and with intravenous contrast. Multiplanar multi echo pulse sequences of the Cervical spine, to include the craniocervical junction and cervicothoracic junction, were obtained without and with intravenous contrast. CONTRAST:  62mL MULTIHANCE GADOBENATE DIMEGLUMINE 529 MG/ML IV  SOLN COMPARISON:  None.  Judeen Hammans UPSTILL PA FINDINGS: MRI HEAD FINDINGS The ventricles and sulci are normal for patient's age. No abnormal parenchymal signal, mass lesions, mass effect. A few scattered subcentimeter supratentorial subcortical and deep white matter T2 hyperintensities. No reduced diffusion to suggest acute ischemia. No susceptibility artifact to suggest hemorrhage. No abnormal extra-axial fluid collections. No extra-axial masses though, contrast enhanced sequences would be more  sensitive. Normal major intracranial vascular flow voids seen at the skull base.Ocular globes and orbital contents are unremarkable though not tailored for evaluation. No abnormal sellar expansion. No suspicious calvarial bone marrow signal. Craniocervical junction maintained. Visualized paranasal sinuses and mastoid air cells are well-aerated. Prominent nasopharyngeal soft tissues can be seen with recent viral illness and, immunocompromised states. MRI CERVICAL SPINE FINDINGS Cervical vertebral bodies and posterior elements intact and aligned with straightened cervical lordosis. Moderate C6-7 disc height loss, mild at C5-6 with decreased T2 signal within the cervical disc compatible with mild desiccation. Mild chronic discogenic endplate changes 075-GRM and C6-7 without STIR signal abnormality to suggest acute osseous process. 7 mm enhancing nodule within RIGHT dorsal column of the spinal cord, at the craniocervical junction. Mildly expansile edema extend to level of C4-5 without syrinx. No surrounding susceptibility artifact to suggest hemorrhage, no reduced diffusion to suggest hypercellular tumor or acute infarct. No suspicious leptomeningeal or epidural enhancement. Included prevertebral and paraspinal soft tissues are nonacute; suspected thyromegaly. Level by level evaluation: C2-3: No disc bulge, canal stenosis nor neural foraminal narrowing. C3-4: Small cyst LEFT central disc protrusion, mild uncovertebral hypertrophy.  Mild canal stenosis without neural foraminal narrowing. C4-5: Small LEFT central to subarticular disc protrusion. Uncovertebral hypertrophy. Very mild canal stenosis. Mild LEFT neural foraminal narrowing. C5-6: 2 mm broad-based disc bulge, uncovertebral hypertrophy. Mild to moderate canal stenosis. Moderate RIGHT greater than LEFT neural foraminal narrowing. C6-7: 2 mm broad-based disc bulge with superimposed moderate to large LEFT subarticular to extra foraminal disc protrusion. Uncovertebral hypertrophy. Mild canal stenosis. Severe LEFT neural foraminal narrowing. C7-T1: No disc bulge, canal stenosis nor neural foraminal narrowing. IMPRESSION: MRI HEAD: No acute intracranial process. Minimal white matter changes can be seen with chronic small vessel ischemic disease, atypical in distribution for demyelination. MRI CERVICAL SPINE: 7 mm enhancing lesion RIGHT dorsal column at proximal cervical spine, with fixed surrounding edema to the level C4-5. Differential diagnosis includes atypical demyelination, subacute infarct, less likely tumor such is ependymoma/astrocytoma. Recommend correlation with CSF sampling. Straightened cervical lordosis without acute fracture malalignment. Degenerative cervical spine. Mild to moderate canal stenosis at C5-6. Neural foraminal narrowing C4-5 through C6-7: Severe on the LEFT at C6-7. Acute findings discussed with and reconfirmed by Frederich Balding PA on 11/12/2015 at 2:20 am. Electronically Signed   By: Elon Alas M.D.   On: 11/12/2015 02:22   Mr Cervical Spine W Wo Contrast  11/12/2015  CLINICAL DATA:  RIGHT facial and arm numbness for 1 week associated with neck pain. History of prominent lymph nodes in the neck, hypertension, hyperlipidemia. EXAM: MRI HEAD WITHOUT CONTRAST MRI CERVICAL SPINE WITHOUT AND WITH CONTRAST TECHNIQUE: Multiplanar, multiecho pulse sequences of the brain and surrounding structures were obtained without and with intravenous contrast. Multiplanar  multi echo pulse sequences of the Cervical spine, to include the craniocervical junction and cervicothoracic junction, were obtained without and with intravenous contrast. CONTRAST:  23mL MULTIHANCE GADOBENATE DIMEGLUMINE 529 MG/ML IV SOLN COMPARISON:  None.  Judeen Hammans UPSTILL PA FINDINGS: MRI HEAD FINDINGS The ventricles and sulci are normal for patient's age. No abnormal parenchymal signal, mass lesions, mass effect. A few scattered subcentimeter supratentorial subcortical and deep white matter T2 hyperintensities. No reduced diffusion to suggest acute ischemia. No susceptibility artifact to suggest hemorrhage. No abnormal extra-axial fluid collections. No extra-axial masses though, contrast enhanced sequences would be more sensitive. Normal major intracranial vascular flow voids seen at the skull base.Ocular globes and orbital contents are unremarkable though not tailored for evaluation. No abnormal sellar  expansion. No suspicious calvarial bone marrow signal. Craniocervical junction maintained. Visualized paranasal sinuses and mastoid air cells are well-aerated. Prominent nasopharyngeal soft tissues can be seen with recent viral illness and, immunocompromised states. MRI CERVICAL SPINE FINDINGS Cervical vertebral bodies and posterior elements intact and aligned with straightened cervical lordosis. Moderate C6-7 disc height loss, mild at C5-6 with decreased T2 signal within the cervical disc compatible with mild desiccation. Mild chronic discogenic endplate changes 075-GRM and C6-7 without STIR signal abnormality to suggest acute osseous process. 7 mm enhancing nodule within RIGHT dorsal column of the spinal cord, at the craniocervical junction. Mildly expansile edema extend to level of C4-5 without syrinx. No surrounding susceptibility artifact to suggest hemorrhage, no reduced diffusion to suggest hypercellular tumor or acute infarct. No suspicious leptomeningeal or epidural enhancement. Included prevertebral and  paraspinal soft tissues are nonacute; suspected thyromegaly. Level by level evaluation: C2-3: No disc bulge, canal stenosis nor neural foraminal narrowing. C3-4: Small cyst LEFT central disc protrusion, mild uncovertebral hypertrophy. Mild canal stenosis without neural foraminal narrowing. C4-5: Small LEFT central to subarticular disc protrusion. Uncovertebral hypertrophy. Very mild canal stenosis. Mild LEFT neural foraminal narrowing. C5-6: 2 mm broad-based disc bulge, uncovertebral hypertrophy. Mild to moderate canal stenosis. Moderate RIGHT greater than LEFT neural foraminal narrowing. C6-7: 2 mm broad-based disc bulge with superimposed moderate to large LEFT subarticular to extra foraminal disc protrusion. Uncovertebral hypertrophy. Mild canal stenosis. Severe LEFT neural foraminal narrowing. C7-T1: No disc bulge, canal stenosis nor neural foraminal narrowing. IMPRESSION: MRI HEAD: No acute intracranial process. Minimal white matter changes can be seen with chronic small vessel ischemic disease, atypical in distribution for demyelination. MRI CERVICAL SPINE: 7 mm enhancing lesion RIGHT dorsal column at proximal cervical spine, with fixed surrounding edema to the level C4-5. Differential diagnosis includes atypical demyelination, subacute infarct, less likely tumor such is ependymoma/astrocytoma. Recommend correlation with CSF sampling. Straightened cervical lordosis without acute fracture malalignment. Degenerative cervical spine. Mild to moderate canal stenosis at C5-6. Neural foraminal narrowing C4-5 through C6-7: Severe on the LEFT at C6-7. Acute findings discussed with and reconfirmed by Frederich Balding PA on 11/12/2015 at 2:20 am. Electronically Signed   By: Elon Alas M.D.   On: 11/12/2015 02:22    Assessment/Plan Principal Problem:   Numbness Active Problems:   Essential hypertension   HLD (hyperlipidemia)   CAD (coronary artery disease), native coronary artery   Acute-on-chronic kidney  injury (HCC)   Neck pain   Hypokalemia   Abnormal MRI, neck  Numbness over right face and arm: Etiology is not clear. Neurology was consulted. DD may include mass lesion or possible acute demyelination per Dr. Nicole [facility].  He recommended thoracic spine MRI without and with contrast.  -will admit to tele bed -Appreciate Dr. Les Pou consultation, with follow-up recommendations: 1.Thoracic spine MRI without and with contrast 2. Possible lumbar puncture for oligoclonal bands, as well as for routine studies 3. Vitamin B 12 and folate levels -pain control: When necessary Percocet and morphine -When necessary Zofran for nausea  HTN: -hold hyazid due to worsening renal function -When necessary hydralazine IV -Continue metoprolol  .HLD: Last LDL was 76 on 10/25/15 -Continue home medications: Lipitor  Hypokalemia: K=3.0 on admission. - Repleted - Check Mg level  CAD: s/p of stent. No CP -continue aspirin, metoprolol and Lipitor  AoCKD-II: Baseline Cre is 0.98 on 10/25/15, her Cre is 1.20 on admission. Likely due to prerenal secondary to dehydration and continuation of diruetics - IVF as above - Check  FeUrea - Follow up  renal function by BMP - Avoid ACEI and NSAIDs - Hold Hyazide   DVT ppx: SCD  Code Status: Full code Family Communication:  Yes, patient's mother at bed side Disposition Plan: Admit to inpatient   Date of Service 11/12/2015    Ivor Costa [facility] Pager [phone]  If 7PM-7AM, please contact night-coverage [email or web address] Password [facility] 11/12/2015, 7:24 AM

## 2015-11-12 NOTE — Consult Note (Signed)
Admission H&P    Chief Complaint: Progressive numbness involving left face, neck, shoulder and upper extremity.  HPI: Anna Henson is an 45 y.o. female history of hypertension, hyperlipidemia, chronic kidney disease and coronary artery disease, presenting with progressive numbness which started on the left side of her face and neck 5 days ago. Numbness has progressed to involve the right shoulder and right upper extremity proximally and distally including right hand. She has not experienced weakness. She also has not experienced any symptoms involving her right lower extremity. MRI of her brain showed no acute abnormality. MRI of cervical spine showed a 7 mm enhancing lesion involving the right dorsal column at the proximal cervical spine with edema extending down to C4-5. Lesion did not appear to be typical of demyelination, nor tumor.  Past Medical History  Diagnosis Date  . Hypertension     > 5 years  . Chronic kidney disease     she sthinks has stage 2 CKD, has had US doppler and has some stenosis , this was done in [place]   . Hyperlipidemia   . ACS (acute coronary syndrome) ([place]) 09/13/2014    Unstable anginal pain s/p 2.25 x 12 mm Promus Premier DES to the RCA  . CAD in native artery   . Tobacco abuse 09/13/2014  . CKD (chronic kidney disease), stage II     Past Surgical History  Procedure Laterality Date  . Hernia repair    . Coronary angioplasty with stent placement      L main OK, LAD OK, CFX 30%, RCA 90%-0% w/ 2.25 x 12 mm Promus Premier DES, EF 55%  . Left heart catheterization with coronary angiogram N/A 09/14/2014    Procedure: LEFT HEART CATHETERIZATION WITH CORONARY ANGIOGRAM;  Surgeon: Burnell Blanks, MD;  Location: [facility] CATH LAB;  Service: Cardiovascular;  Laterality: N/A;  . Percutaneous coronary stent intervention (pci-s)  09/14/2014    Procedure: PERCUTANEOUS CORONARY STENT INTERVENTION (PCI-S);  Surgeon: Burnell Blanks, MD;  Location: [facility] CATH LAB;   Service: Cardiovascular;;  . Tonsillectomy  2005    10 years ago    Family History  Problem Relation Age of Onset  . Diabetes Mother   . Heart disease Mother   . Hyperlipidemia Mother   . Hypertension Mother   . Lung cancer Father    Social History:  reports that she has quit smoking. Her smoking use included Cigarettes. She smoked 0.50 packs per day. She has never used smokeless tobacco. She reports that she does not drink alcohol or use illicit drugs.  Allergies: No Known Allergies  Medications. Preadmission medications were reviewed by me.  ROS: History obtained from the patient  General ROS: negative for - chills, fatigue, fever, night sweats, weight gain or weight loss Psychological ROS: negative for - behavioral disorder, hallucinations, memory difficulties, mood swings or suicidal ideation Ophthalmic ROS: negative for - blurry vision, double vision, eye pain or loss of vision ENT ROS: negative for - epistaxis, nasal discharge, oral lesions, sore throat, tinnitus or vertigo Allergy and Immunology ROS: negative for - hives or itchy/watery eyes Hematological and Lymphatic ROS: negative for - bleeding problems, bruising or swollen lymph nodes Endocrine ROS: negative for - galactorrhea, hair pattern changes, polydipsia/polyuria or temperature intolerance Respiratory ROS: negative for - cough, hemoptysis, shortness of breath or wheezing Cardiovascular ROS: negative for - chest pain, dyspnea on exertion, edema or irregular heartbeat Gastrointestinal ROS: negative for - abdominal pain, diarrhea, hematemesis, nausea/vomiting or stool incontinence Genito-Urinary ROS: negative  for - dysuria, hematuria, incontinence or urinary frequency/urgency Musculoskeletal ROS: negative for - joint swelling or muscular weakness Neurological ROS: as noted in HPI Dermatological ROS: negative for rash and skin lesion changes  Physical Examination: Blood pressure 120/76, pulse 57, temperature 98.1  F (36.7 C), temperature source Oral, resp. rate 16, height 5\' 1"  (1.549 m), weight 74.503 kg (164 lb 4 oz), last menstrual period 11/07/2015, SpO2 100 %.  HEENT-  Normocephalic, no lesions, without obvious abnormality.  Normal external eye and conjunctiva.  Normal TM's bilaterally.  Normal auditory canals and external ears. Normal external nose, mucus membranes and septum.  Normal pharynx. Neck supple with no masses, nodes, nodules or enlargement. Cardiovascular - regular rate and rhythm, S1, S2 normal, no murmur, click, rub or gallop Lungs - chest clear, no wheezing, rales, normal symmetric air entry Abdomen - soft, non-tender; bowel sounds normal; no masses,  no organomegaly Extremities - no joint deformities, effusion, or inflammation and no edema  Neurologic Examination: Mental Status: Alert, oriented, thought content appropriate.  Speech fluent without evidence of aphasia. Able to follow commands without difficulty. Cranial Nerves: II-Visual fields were normal. III/IV/VI-Pupils were equal and reacted normally to light. Extraocular movements were full and conjugate.    V/VII-reduced perception of tactile sensation over the right side of the face; no facial weakness. VIII-normal. X-normal speech and symmetrical palatal movement. XI: trapezius strength/neck flexion strength normal bilaterally XII-midline tongue extension with normal strength. Motor: 5/5 bilaterally with normal tone and bulk Sensory: Reduced perception of tactile sensation over right upper extremity proximally and distally compared to left upper extremity; normal sensory findings otherwise, including vibratory sensation in lower extremities. Deep Tendon Reflexes: 1+ and symmetric. Plantars: Flexor bilaterally Cerebellar: Normal finger-to-nose testing. Carotid auscultation: Normal  Results for orders placed or performed during the hospital encounter of 11/11/15 (from the past 48 hour(s))  I-Stat Chem 8, ED  (not at [facility],  [facility])     Status: Abnormal   Collection Time: 11/11/15 11:30 PM  Result Value Ref Range   Sodium 140 135 - 145 mmol/L   Potassium 3.3 (L) 3.5 - 5.1 mmol/L   Chloride 100 (L) 101 - 111 mmol/L   BUN 13 6 - 20 mg/dL   Creatinine, Ser 1.20 (H) 0.44 - 1.00 mg/dL   Glucose, Bld 99 65 - 99 mg/dL   Calcium, Ion 1.20 1.12 - 1.23 mmol/L   TCO2 28 0 - 100 mmol/L   Hemoglobin 12.2 12.0 - 15.0 g/dL   HCT 36.0 36.0 - 46.0 %   Ct Soft Tissue Neck W Contrast  11/11/2015  CLINICAL DATA:  Right facial numbness. Throat pain and drooling. The patient has been on antibiotics for 5 days without relief. EXAM: CT NECK WITH CONTRAST TECHNIQUE: Multidetector CT imaging of the neck was performed using the standard protocol following the bolus administration of intravenous contrast. CONTRAST:  21mL OMNIPAQUE IOHEXOL 300 MG/ML  SOLN COMPARISON:  None. FINDINGS: Pharynx and larynx: With there is moderate prominence of the adenoid tissue on lingual tonsils bilaterally. The mild prominence the palatine tonsils. No discrete abscess is or fluid collection is present. The epiglottis is within normal limits. Salivary glands: The submandibular and parotid glands are within normal limits bilaterally. Thyroid: Within normal limits. Lymph nodes: Bilateral sub cm reactive type lymph nodes are present. Vascular: Atherosclerotic calcifications are present at the carotid bifurcations bilaterally without significant stenosis. Limited intracranial: Within normal limits Visualized orbits: Not imaged Mastoids and visualized paranasal sinuses: Clear Skeleton: Mild endplate degenerative change in  uncovertebral spurring is present at C5-6 and C6-7. Osseous foraminal narrowing is worse on the right at C5-6 and on the left at C6-7. There is reversal of the normal cervical lordosis. Upper chest: The lung apices are clear. IMPRESSION: 1. Mild prominence of the adenoid tissue and lingual tonsils without a discrete fluid collection or abscess. 2. Reactive  type bilateral level 2 and level 3 lymph nodes. 3. The airway is patent.  No focal mass lesion is evident. Electronically Signed   By: San Morelle M.D.   On: 11/11/2015 11:58   Mr Brain Wo Contrast  11/12/2015  CLINICAL DATA:  RIGHT facial and arm numbness for 1 week associated with neck pain. History of prominent lymph nodes in the neck, hypertension, hyperlipidemia. EXAM: MRI HEAD WITHOUT CONTRAST MRI CERVICAL SPINE WITHOUT AND WITH CONTRAST TECHNIQUE: Multiplanar, multiecho pulse sequences of the brain and surrounding structures were obtained without and with intravenous contrast. Multiplanar multi echo pulse sequences of the Cervical spine, to include the craniocervical junction and cervicothoracic junction, were obtained without and with intravenous contrast. CONTRAST:  90mL MULTIHANCE GADOBENATE DIMEGLUMINE 529 MG/ML IV SOLN COMPARISON:  None.  Judeen Hammans UPSTILL PA FINDINGS: MRI HEAD FINDINGS The ventricles and sulci are normal for patient's age. No abnormal parenchymal signal, mass lesions, mass effect. A few scattered subcentimeter supratentorial subcortical and deep white matter T2 hyperintensities. No reduced diffusion to suggest acute ischemia. No susceptibility artifact to suggest hemorrhage. No abnormal extra-axial fluid collections. No extra-axial masses though, contrast enhanced sequences would be more sensitive. Normal major intracranial vascular flow voids seen at the skull base.Ocular globes and orbital contents are unremarkable though not tailored for evaluation. No abnormal sellar expansion. No suspicious calvarial bone marrow signal. Craniocervical junction maintained. Visualized paranasal sinuses and mastoid air cells are well-aerated. Prominent nasopharyngeal soft tissues can be seen with recent viral illness and, immunocompromised states. MRI CERVICAL SPINE FINDINGS Cervical vertebral bodies and posterior elements intact and aligned with straightened cervical lordosis. Moderate C6-7  disc height loss, mild at C5-6 with decreased T2 signal within the cervical disc compatible with mild desiccation. Mild chronic discogenic endplate changes 075-GRM and C6-7 without STIR signal abnormality to suggest acute osseous process. 7 mm enhancing nodule within RIGHT dorsal column of the spinal cord, at the craniocervical junction. Mildly expansile edema extend to level of C4-5 without syrinx. No surrounding susceptibility artifact to suggest hemorrhage, no reduced diffusion to suggest hypercellular tumor or acute infarct. No suspicious leptomeningeal or epidural enhancement. Included prevertebral and paraspinal soft tissues are nonacute; suspected thyromegaly. Level by level evaluation: C2-3: No disc bulge, canal stenosis nor neural foraminal narrowing. C3-4: Small cyst LEFT central disc protrusion, mild uncovertebral hypertrophy. Mild canal stenosis without neural foraminal narrowing. C4-5: Small LEFT central to subarticular disc protrusion. Uncovertebral hypertrophy. Very mild canal stenosis. Mild LEFT neural foraminal narrowing. C5-6: 2 mm broad-based disc bulge, uncovertebral hypertrophy. Mild to moderate canal stenosis. Moderate RIGHT greater than LEFT neural foraminal narrowing. C6-7: 2 mm broad-based disc bulge with superimposed moderate to large LEFT subarticular to extra foraminal disc protrusion. Uncovertebral hypertrophy. Mild canal stenosis. Severe LEFT neural foraminal narrowing. C7-T1: No disc bulge, canal stenosis nor neural foraminal narrowing. IMPRESSION: MRI HEAD: No acute intracranial process. Minimal white matter changes can be seen with chronic small vessel ischemic disease, atypical in distribution for demyelination. MRI CERVICAL SPINE: 7 mm enhancing lesion RIGHT dorsal column at proximal cervical spine, with fixed surrounding edema to the level C4-5. Differential diagnosis includes atypical demyelination, subacute infarct, less likely tumor  such is ependymoma/astrocytoma. Recommend  correlation with CSF sampling. Straightened cervical lordosis without acute fracture malalignment. Degenerative cervical spine. Mild to moderate canal stenosis at C5-6. Neural foraminal narrowing C4-5 through C6-7: Severe on the LEFT at C6-7. Acute findings discussed with and reconfirmed by Frederich Balding PA on 11/12/2015 at 2:20 am. Electronically Signed   By: Elon Alas M.D.   On: 11/12/2015 02:22   Mr Cervical Spine W Wo Contrast  11/12/2015  CLINICAL DATA:  RIGHT facial and arm numbness for 1 week associated with neck pain. History of prominent lymph nodes in the neck, hypertension, hyperlipidemia. EXAM: MRI HEAD WITHOUT CONTRAST MRI CERVICAL SPINE WITHOUT AND WITH CONTRAST TECHNIQUE: Multiplanar, multiecho pulse sequences of the brain and surrounding structures were obtained without and with intravenous contrast. Multiplanar multi echo pulse sequences of the Cervical spine, to include the craniocervical junction and cervicothoracic junction, were obtained without and with intravenous contrast. CONTRAST:  14mL MULTIHANCE GADOBENATE DIMEGLUMINE 529 MG/ML IV SOLN COMPARISON:  None.  Judeen Hammans UPSTILL PA FINDINGS: MRI HEAD FINDINGS The ventricles and sulci are normal for patient's age. No abnormal parenchymal signal, mass lesions, mass effect. A few scattered subcentimeter supratentorial subcortical and deep white matter T2 hyperintensities. No reduced diffusion to suggest acute ischemia. No susceptibility artifact to suggest hemorrhage. No abnormal extra-axial fluid collections. No extra-axial masses though, contrast enhanced sequences would be more sensitive. Normal major intracranial vascular flow voids seen at the skull base.Ocular globes and orbital contents are unremarkable though not tailored for evaluation. No abnormal sellar expansion. No suspicious calvarial bone marrow signal. Craniocervical junction maintained. Visualized paranasal sinuses and mastoid air cells are well-aerated. Prominent  nasopharyngeal soft tissues can be seen with recent viral illness and, immunocompromised states. MRI CERVICAL SPINE FINDINGS Cervical vertebral bodies and posterior elements intact and aligned with straightened cervical lordosis. Moderate C6-7 disc height loss, mild at C5-6 with decreased T2 signal within the cervical disc compatible with mild desiccation. Mild chronic discogenic endplate changes 075-GRM and C6-7 without STIR signal abnormality to suggest acute osseous process. 7 mm enhancing nodule within RIGHT dorsal column of the spinal cord, at the craniocervical junction. Mildly expansile edema extend to level of C4-5 without syrinx. No surrounding susceptibility artifact to suggest hemorrhage, no reduced diffusion to suggest hypercellular tumor or acute infarct. No suspicious leptomeningeal or epidural enhancement. Included prevertebral and paraspinal soft tissues are nonacute; suspected thyromegaly. Level by level evaluation: C2-3: No disc bulge, canal stenosis nor neural foraminal narrowing. C3-4: Small cyst LEFT central disc protrusion, mild uncovertebral hypertrophy. Mild canal stenosis without neural foraminal narrowing. C4-5: Small LEFT central to subarticular disc protrusion. Uncovertebral hypertrophy. Very mild canal stenosis. Mild LEFT neural foraminal narrowing. C5-6: 2 mm broad-based disc bulge, uncovertebral hypertrophy. Mild to moderate canal stenosis. Moderate RIGHT greater than LEFT neural foraminal narrowing. C6-7: 2 mm broad-based disc bulge with superimposed moderate to large LEFT subarticular to extra foraminal disc protrusion. Uncovertebral hypertrophy. Mild canal stenosis. Severe LEFT neural foraminal narrowing. C7-T1: No disc bulge, canal stenosis nor neural foraminal narrowing. IMPRESSION: MRI HEAD: No acute intracranial process. Minimal white matter changes can be seen with chronic small vessel ischemic disease, atypical in distribution for demyelination. MRI CERVICAL SPINE: 7 mm enhancing  lesion RIGHT dorsal column at proximal cervical spine, with fixed surrounding edema to the level C4-5. Differential diagnosis includes atypical demyelination, subacute infarct, less likely tumor such is ependymoma/astrocytoma. Recommend correlation with CSF sampling. Straightened cervical lordosis without acute fracture malalignment. Degenerative cervical spine. Mild to moderate canal stenosis at C5-6.  Neural foraminal narrowing C4-5 through C6-7: Severe on the LEFT at C6-7. Acute findings discussed with and reconfirmed by Frederich Balding PA on 11/12/2015 at 2:20 am. Electronically Signed   By: Elon Alas M.D.   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 Etiology is unclear, but includes mass lesion or possible acute demyelination.  Recommendations: 1. Thoracic spine MRI without and with contrast 2. Possible lumbar puncture for oligoclonal bands, as well as for routine studies 3. Vitamin B 12 and folate levels  We will continue to follow this patient with you.  C.R. Nicole Kindred, MD [facility] [phone]  11/12/2015, 3:51 AM

## 2015-11-12 NOTE — Procedures (Signed)
Indication: Abnormal enhancement on cervical spine MRI  Risks of the procedure were dicussed with the patient including post-LP headache, bleeding, infection, weakness/numbness of legs(radiculopathy), death.  The patient/patient's proxy agreed and written consent was obtained.   The patient was prepped and draped, and using sterile technique a 20 gauge quinke spinal needle was inserted in the L4/5 space. Unfortunately no spinal fluid was obtained and spinal canal was not entered. Due to multiple failed attempts Procedure was terminated and will have procedure done under fluoroscopy  Etta Quill PA-C [facility] [phone]  11/12/2015, 11:22 AM  I was present for the procedure.   Roland Rack, MD [facility] [phone]  If 7pm- 7am, please page neurology on call as listed in [place].

## 2015-11-12 NOTE — Progress Notes (Signed)
[facility] PROGRESS NOTE    Progress Note   Anna Henson [id number] DOB: November 16, 1970 DOA: 11/11/2015 PCP: Leotis Pain, DO   Brief Narrative:   Anna Henson is an 45 y.o. female with past medical history of hypertension CAD status post stent, chronic in the 60s to represent with numbness on the right.  Assessment/Plan:   Numbness on the right arm and face: Unclear etiology neurology was consulted who recommended an MRI of the brain, MRI of the C-spine show edema and demyelination of the cervical spine MRI of the thoracic spine is pending. Differential is broad MS transfer myelitis and/or stroke.  RBC folate and B-12 are pending. Continue Zofran for nausea narcotics for pain. If workup negative, Possible lumbar puncture for oligoclonal banding.    Acute kidney injury: Baseline creatinine in January 2017 was 1.0. Check urine lites, start on IV fluid. Recheck a basic metabolic panel in the morning. Hold nephrotoxic drugs.  Essential hypertension: Continue metoprolol orally and hydralazine IV when necessary.     HLD (hyperlipidemia) Continue statins.  Hypokalemia: Repleted and resolved magnesium was 2.1.    CAD (coronary artery disease), native coronary artery Continue aspirin and metoprolol and statins.    Acute-on-chronic kidney injury ([place]) Check a fractional secretion of sodium, continue IV fluids recheck a basic metabolic panel in the morning.  Hypokalemia Resolved.    DVT Prophylaxis - Lovenox ordered.  Family Communication: none Disposition Plan: Home in 2-3 days Code Status:     Code Status Orders        Start     Ordered   11/12/15 0308  Full code   Continuous     11/12/15 0309    Code Status History    Date Active Date Inactive Code Status Order ID Comments User Context   09/14/2014 10:39 AM 09/16/2014  3:51 PM Full Code [id number]  Burnell Blanks, MD Inpatient   09/13/2014  6:39 AM 09/14/2014 10:39 AM Full Code [id number]  Ivor Costa, MD Inpatient        IV Access:    Peripheral IV   Procedures and diagnostic studies:   Ct Soft Tissue Neck W Contrast  11/11/2015  CLINICAL DATA:  Right facial numbness. Throat pain and drooling. The patient has been on antibiotics for 5 days without relief. EXAM: CT NECK WITH CONTRAST TECHNIQUE: Multidetector CT imaging of the neck was performed using the standard protocol following the bolus administration of intravenous contrast. CONTRAST:  58mL OMNIPAQUE IOHEXOL 300 MG/ML  SOLN COMPARISON:  None. FINDINGS: Pharynx and larynx: With there is moderate prominence of the adenoid tissue on lingual tonsils bilaterally. The mild prominence the palatine tonsils. No discrete abscess is or fluid collection is present. The epiglottis is within normal limits. Salivary glands: The submandibular and parotid glands are within normal limits bilaterally. Thyroid: Within normal limits. Lymph nodes: Bilateral sub cm reactive type lymph nodes are present. Vascular: Atherosclerotic calcifications are present at the carotid bifurcations bilaterally without significant stenosis. Limited intracranial: Within normal limits Visualized orbits: Not imaged Mastoids and visualized paranasal sinuses: Clear Skeleton: Mild endplate degenerative change in uncovertebral spurring is present at C5-6 and C6-7. Osseous foraminal narrowing is worse on the right at C5-6 and on the left at C6-7. There is reversal of the normal cervical lordosis. Upper chest: The lung apices are clear. IMPRESSION: 1. Mild prominence of the adenoid tissue and lingual tonsils without a discrete fluid collection or abscess. 2. Reactive type bilateral level 2 and level 3 lymph nodes. 3.  The airway is patent.  No focal mass lesion is evident. Electronically Signed   By: San Morelle M.D.   On: 11/11/2015 11:58   Mr Brain Wo Contrast  11/12/2015  CLINICAL DATA:  RIGHT facial and arm numbness for 1 week associated with neck pain. History of prominent  lymph nodes in the neck, hypertension, hyperlipidemia. EXAM: MRI HEAD WITHOUT CONTRAST MRI CERVICAL SPINE WITHOUT AND WITH CONTRAST TECHNIQUE: Multiplanar, multiecho pulse sequences of the brain and surrounding structures were obtained without and with intravenous contrast. Multiplanar multi echo pulse sequences of the Cervical spine, to include the craniocervical junction and cervicothoracic junction, were obtained without and with intravenous contrast. CONTRAST:  18mL MULTIHANCE GADOBENATE DIMEGLUMINE 529 MG/ML IV SOLN COMPARISON:  None.  Judeen Hammans UPSTILL PA FINDINGS: MRI HEAD FINDINGS The ventricles and sulci are normal for patient's age. No abnormal parenchymal signal, mass lesions, mass effect. A few scattered subcentimeter supratentorial subcortical and deep white matter T2 hyperintensities. No reduced diffusion to suggest acute ischemia. No susceptibility artifact to suggest hemorrhage. No abnormal extra-axial fluid collections. No extra-axial masses though, contrast enhanced sequences would be more sensitive. Normal major intracranial vascular flow voids seen at the skull base.Ocular globes and orbital contents are unremarkable though not tailored for evaluation. No abnormal sellar expansion. No suspicious calvarial bone marrow signal. Craniocervical junction maintained. Visualized paranasal sinuses and mastoid air cells are well-aerated. Prominent nasopharyngeal soft tissues can be seen with recent viral illness and, immunocompromised states. MRI CERVICAL SPINE FINDINGS Cervical vertebral bodies and posterior elements intact and aligned with straightened cervical lordosis. Moderate C6-7 disc height loss, mild at C5-6 with decreased T2 signal within the cervical disc compatible with mild desiccation. Mild chronic discogenic endplate changes 075-GRM and C6-7 without STIR signal abnormality to suggest acute osseous process. 7 mm enhancing nodule within RIGHT dorsal column of the spinal cord, at the craniocervical  junction. Mildly expansile edema extend to level of C4-5 without syrinx. No surrounding susceptibility artifact to suggest hemorrhage, no reduced diffusion to suggest hypercellular tumor or acute infarct. No suspicious leptomeningeal or epidural enhancement. Included prevertebral and paraspinal soft tissues are nonacute; suspected thyromegaly. Level by level evaluation: C2-3: No disc bulge, canal stenosis nor neural foraminal narrowing. C3-4: Small cyst LEFT central disc protrusion, mild uncovertebral hypertrophy. Mild canal stenosis without neural foraminal narrowing. C4-5: Small LEFT central to subarticular disc protrusion. Uncovertebral hypertrophy. Very mild canal stenosis. Mild LEFT neural foraminal narrowing. C5-6: 2 mm broad-based disc bulge, uncovertebral hypertrophy. Mild to moderate canal stenosis. Moderate RIGHT greater than LEFT neural foraminal narrowing. C6-7: 2 mm broad-based disc bulge with superimposed moderate to large LEFT subarticular to extra foraminal disc protrusion. Uncovertebral hypertrophy. Mild canal stenosis. Severe LEFT neural foraminal narrowing. C7-T1: No disc bulge, canal stenosis nor neural foraminal narrowing. IMPRESSION: MRI HEAD: No acute intracranial process. Minimal white matter changes can be seen with chronic small vessel ischemic disease, atypical in distribution for demyelination. MRI CERVICAL SPINE: 7 mm enhancing lesion RIGHT dorsal column at proximal cervical spine, with fixed surrounding edema to the level C4-5. Differential diagnosis includes atypical demyelination, subacute infarct, less likely tumor such is ependymoma/astrocytoma. Recommend correlation with CSF sampling. Straightened cervical lordosis without acute fracture malalignment. Degenerative cervical spine. Mild to moderate canal stenosis at C5-6. Neural foraminal narrowing C4-5 through C6-7: Severe on the LEFT at C6-7. Acute findings discussed with and reconfirmed by Frederich Balding PA on 11/12/2015 at 2:20 am.  Electronically Signed   By: Elon Alas M.D.   On: 11/12/2015 02:22  Mr Cervical Spine W Wo Contrast  11/12/2015  CLINICAL DATA:  RIGHT facial and arm numbness for 1 week associated with neck pain. History of prominent lymph nodes in the neck, hypertension, hyperlipidemia. EXAM: MRI HEAD WITHOUT CONTRAST MRI CERVICAL SPINE WITHOUT AND WITH CONTRAST TECHNIQUE: Multiplanar, multiecho pulse sequences of the brain and surrounding structures were obtained without and with intravenous contrast. Multiplanar multi echo pulse sequences of the Cervical spine, to include the craniocervical junction and cervicothoracic junction, were obtained without and with intravenous contrast. CONTRAST:  3mL MULTIHANCE GADOBENATE DIMEGLUMINE 529 MG/ML IV SOLN COMPARISON:  None.  Judeen Hammans UPSTILL PA FINDINGS: MRI HEAD FINDINGS The ventricles and sulci are normal for patient's age. No abnormal parenchymal signal, mass lesions, mass effect. A few scattered subcentimeter supratentorial subcortical and deep white matter T2 hyperintensities. No reduced diffusion to suggest acute ischemia. No susceptibility artifact to suggest hemorrhage. No abnormal extra-axial fluid collections. No extra-axial masses though, contrast enhanced sequences would be more sensitive. Normal major intracranial vascular flow voids seen at the skull base.Ocular globes and orbital contents are unremarkable though not tailored for evaluation. No abnormal sellar expansion. No suspicious calvarial bone marrow signal. Craniocervical junction maintained. Visualized paranasal sinuses and mastoid air cells are well-aerated. Prominent nasopharyngeal soft tissues can be seen with recent viral illness and, immunocompromised states. MRI CERVICAL SPINE FINDINGS Cervical vertebral bodies and posterior elements intact and aligned with straightened cervical lordosis. Moderate C6-7 disc height loss, mild at C5-6 with decreased T2 signal within the cervical disc compatible with  mild desiccation. Mild chronic discogenic endplate changes 075-GRM and C6-7 without STIR signal abnormality to suggest acute osseous process. 7 mm enhancing nodule within RIGHT dorsal column of the spinal cord, at the craniocervical junction. Mildly expansile edema extend to level of C4-5 without syrinx. No surrounding susceptibility artifact to suggest hemorrhage, no reduced diffusion to suggest hypercellular tumor or acute infarct. No suspicious leptomeningeal or epidural enhancement. Included prevertebral and paraspinal soft tissues are nonacute; suspected thyromegaly. Level by level evaluation: C2-3: No disc bulge, canal stenosis nor neural foraminal narrowing. C3-4: Small cyst LEFT central disc protrusion, mild uncovertebral hypertrophy. Mild canal stenosis without neural foraminal narrowing. C4-5: Small LEFT central to subarticular disc protrusion. Uncovertebral hypertrophy. Very mild canal stenosis. Mild LEFT neural foraminal narrowing. C5-6: 2 mm broad-based disc bulge, uncovertebral hypertrophy. Mild to moderate canal stenosis. Moderate RIGHT greater than LEFT neural foraminal narrowing. C6-7: 2 mm broad-based disc bulge with superimposed moderate to large LEFT subarticular to extra foraminal disc protrusion. Uncovertebral hypertrophy. Mild canal stenosis. Severe LEFT neural foraminal narrowing. C7-T1: No disc bulge, canal stenosis nor neural foraminal narrowing. IMPRESSION: MRI HEAD: No acute intracranial process. Minimal white matter changes can be seen with chronic small vessel ischemic disease, atypical in distribution for demyelination. MRI CERVICAL SPINE: 7 mm enhancing lesion RIGHT dorsal column at proximal cervical spine, with fixed surrounding edema to the level C4-5. Differential diagnosis includes atypical demyelination, subacute infarct, less likely tumor such is ependymoma/astrocytoma. Recommend correlation with CSF sampling. Straightened cervical lordosis without acute fracture malalignment.  Degenerative cervical spine. Mild to moderate canal stenosis at C5-6. Neural foraminal narrowing C4-5 through C6-7: Severe on the LEFT at C6-7. Acute findings discussed with and reconfirmed by Frederich Balding PA on 11/12/2015 at 2:20 am. Electronically Signed   By: Elon Alas M.D.   On: 11/12/2015 02:22     Medical Consultants:    None.  Anti-Infectives:   Anti-infectives    None      Subjective:  Rorie Yuen continue his complain of numbness in the right arm no weakness.  Objective:    Filed Vitals:   11/12/15 0454 11/12/15 0456 11/12/15 0505 11/12/15 0809  BP: 167/105 157/84  126/66  Pulse: 89 80  62  Temp: 98 F (36.7 C)   98 F (36.7 C)  TempSrc: Oral   Oral  Resp: 20   20  Height:   5\' 1"  (1.549 m)   Weight:   73.6 kg (162 lb 4.1 oz)   SpO2: 98% 99%  100%    Intake/Output Summary (Last 24 hours) at 11/12/15 0841 Last data filed at 11/12/15 [id number]  Gross per 24 hour  Intake 198.33 ml  Output   1050 ml  Net -851.67 ml   Filed Weights   11/11/15 2158 11/12/15 0505  Weight: 74.503 kg (164 lb 4 oz) 73.6 kg (162 lb 4.1 oz)    Exam: Gen:  NAD Cardiovascular:  RRR, No M/R/G Chest and lungs:   CTAB Abdomen:  Abdomen soft, NT/ND, + BS Extremities:  No edema Neurological exam she: She is awake alert and oriented 3 coherent for language 2-12 are grossly intact sensation is decreased on her right arm up to her shoulder, muscle strength is hormonal 4 extremities deep tendon reflexes 2+ symmetrical and bilaterally.   Data Reviewed:    Labs: Basic Metabolic Panel:  Recent Labs Lab 11/11/15 2330 11/12/15 0351  NA 140 140  K 3.3* 3.7  CL 100* 103  CO2  --  25  GLUCOSE 99 107*  BUN 13 12  CREATININE 1.20* 1.25*  CALCIUM  --  9.3  MG  --  2.1   GFR Estimated Creatinine Clearance: 53.2 mL/min (by C-G formula based on Cr of 1.25). Liver Function Tests:  Recent Labs Lab 11/12/15 0351  AST 30  ALT 28  ALKPHOS 51  BILITOT 0.5  PROT 6.2*    ALBUMIN 3.6   No results for input(s): LIPASE, AMYLASE in the last 168 hours. No results for input(s): AMMONIA in the last 168 hours. Coagulation profile  Recent Labs Lab 11/12/15 0350  INR 1.03    CBC:  Recent Labs Lab 11/07/15 0931 11/11/15 2330 11/12/15 0351  WBC 10.1  --  7.0  HGB 12.6 12.2 12.5  HCT 34.9* 36.0 36.0  MCV 90.3  --  91.4  PLT  --   --  331   Cardiac Enzymes: No results for input(s): CKTOTAL, CKMB, CKMBINDEX, TROPONINI in the last 168 hours. BNP (last 3 results) No results for input(s): PROBNP in the last 8760 hours. CBG:  Recent Labs Lab 11/12/15 0501 11/12/15 0737  GLUCAP 102* 102*   D-Dimer: No results for input(s): DDIMER in the last 72 hours. Hgb A1c: No results for input(s): HGBA1C in the last 72 hours. Lipid Profile: No results for input(s): CHOL, HDL, LDLCALC, TRIG, CHOLHDL, LDLDIRECT in the last 72 hours. Thyroid function studies: No results for input(s): TSH, T4TOTAL, T3FREE, THYROIDAB in the last 72 hours.  Invalid input(s): FREET3 Anemia work up: No results for input(s): VITAMINB12, FOLATE, FERRITIN, TIBC, IRON, RETICCTPCT in the last 72 hours. Sepsis Labs:  Recent Labs Lab 11/07/15 0931 11/12/15 0351  WBC 10.1 7.0   Microbiology No results found for this or any previous visit (from the past 240 hour(s)).   Medications:   . aspirin  81 mg Oral Daily  . atorvastatin  80 mg Oral Daily  . LORazepam  1 mg Oral Once  . metoprolol  50 mg Oral BID  .  sodium chloride flush  3 mL Intravenous Q12H   Continuous Infusions: . sodium chloride 100 mL/hr at 11/12/15 0325    Time spent: 25 min   LOS: 0 days   Charlynne Cousins  [facility] Pager [phone]  *Please refer to [email or web address], password [email or web address] to get updated schedule on who will round on this patient, as hospitalists switch teams weekly. If 7PM-7AM, please contact night-coverage at [email or web address], password [email or web address] for any overnight needs.  11/12/2015, 8:41 AM

## 2015-11-13 ENCOUNTER — Inpatient Hospital Stay (HOSPITAL_COMMUNITY): Payer: Federal, State, Local not specified - PPO

## 2015-11-13 LAB — BASIC METABOLIC PANEL
Anion gap: 12 (ref 5–15)
BUN: 11 mg/dL (ref 6–20)
CO2: 22 mmol/L (ref 22–32)
Calcium: 8.8 mg/dL — ABNORMAL LOW (ref 8.9–10.3)
Chloride: 102 mmol/L (ref 101–111)
Creatinine, Ser: 1.13 mg/dL — ABNORMAL HIGH (ref 0.44–1.00)
GFR calc Af Amer: >60 mL/min (ref >60)
GFR calc non Af Amer: 59 mL/min — ABNORMAL LOW (ref >60)
Glucose, Bld: 140 mg/dL — ABNORMAL HIGH (ref 65–99)
Potassium: 3.2 mmol/L — ABNORMAL LOW (ref 3.5–5.1)
Sodium: 136 mmol/L (ref 135–145)

## 2015-11-13 LAB — MPO/PR-3 (ANCA) ANTIBODIES
ANCA Proteinase 3: <3.5 U/mL (ref 0.0–3.5)
Myeloperoxidase Abs: <9 U/mL (ref 0.0–9.0)

## 2015-11-13 LAB — SJOGRENS SYNDROME-A EXTRACTABLE NUCLEAR ANTIBODY: SSA (Ro) (ENA) Antibody, IgG: <0.2 AI (ref 0.0–0.9)

## 2015-11-13 LAB — UREA NITROGEN, URINE: Urea Nitrogen, Ur: 1083 mg/dL

## 2015-11-13 LAB — GLUCOSE, CAPILLARY: Glucose-Capillary: 169 mg/dL — ABNORMAL HIGH (ref 65–99)

## 2015-11-13 LAB — SJOGRENS SYNDROME-B EXTRACTABLE NUCLEAR ANTIBODY: SSB (La) (ENA) Antibody, IgG: <0.2 AI (ref 0.0–0.9)

## 2015-11-13 MED ORDER — GADOBENATE DIMEGLUMINE 529 MG/ML IV SOLN
20.0000 mL | Freq: Once | INTRAVENOUS | Status: AC | PRN
  Administered 2015-11-13: 15 mL via INTRAVENOUS

## 2015-11-13 MED ORDER — LORAZEPAM 1 MG PO TABS
1.0000 mg | ORAL_TABLET | Freq: Once | ORAL | Status: AC
  Administered 2015-11-13: 1 mg via ORAL
  Filled 2015-11-13: qty 1

## 2015-11-13 MED ORDER — SODIUM CHLORIDE 0.9 % IV SOLN
INTRAVENOUS | Status: AC
  Administered 2015-11-13: 13:00:00 via INTRAVENOUS

## 2015-11-13 MED ORDER — POTASSIUM CHLORIDE CRYS ER 20 MEQ PO TBCR
40.0000 meq | EXTENDED_RELEASE_TABLET | Freq: Two times a day (BID) | ORAL | Status: AC
  Administered 2015-11-13 (×2): 40 meq via ORAL
  Filled 2015-11-13 (×2): qty 2

## 2015-11-13 NOTE — Progress Notes (Signed)
Subjective: No changes.   Exam: Filed Vitals:   11/13/15 1020 11/13/15 1415  BP: 124/75 114/58  Pulse: 70 85  Temp:  98.2 F (36.8 C)  Resp:  18   Gen: In bed, NAD Resp: non-labored breathing, no acute distress Abd: soft, nt  Neuro: MS: awake, alert,  [id number] Motor: MAEW Sensory: reduced in right arm and neck, sparing the face.   HIV negative  Pending: CSF studies ANCA, ANA, SSA, SSB, NMO   Impression: 45 yo F with inflammatory cord changes as evidenced by enhancement. 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   Differential diagnosis includes multiple sclerosis, and neuromyelitis optica, infectious myelitis, autoimmune disease, metabolic myelopathy.   I think infectious is very unlikley with no fever or other signs of infection. Onset is fairly abrupt for a metabolic process but the subacute onset is not consistent with cord infarct.   She agrees that she will re-attempt an LP under radiology.   Recommendations: 1) Lumbar puncture under fluoro when possible.  2) await pending labs.   Roland Rack, MD [facility] [phone]  If 7pm- 7am, please page neurology on call as listed in [place].

## 2015-11-13 NOTE — Care Management Note (Addendum)
Case Management Note  Patient Details  Name: Anna Henson MRN: [id number] Date of Birth: 1971-09-10  Subjective/Objective:                 Admitted with RUE and face numbness. PMH of hypertension, hyperlipidemia, CAD, as set up a stent placement, chronic kidney disease-stage II. Independent with ADL's.  Uses no assistive devices. Lives alone, however mom lives in same building as pt and will provide assist to daughter if needed @ d/c. PCP: Thao P LE.   Action/Plan: Awaiting LP Return to home when medically stable. CM to f/u with disposition needs.  Expected Discharge Date:                  Expected Discharge Plan:  Home/Self Care  In-House Referral:     Discharge planning Services  CM Consult  Post Acute Care Choice:    Choice offered to:     DME Arranged:    DME Agency:     HH Arranged:    HH Agency:     Status of Service:  In process, will continue to follow  Medicare Important Message Given:    Date Medicare IM Given:    Medicare IM give by:    Date Additional Medicare IM Given:    Additional Medicare Important Message give by:     If discussed at [place] of Stay Meetings, dates discussed:    Additional Comments: Anna Henson (Mother) [phone]  Anna Henson [place], [place] [phone] 11/13/2015, 10:24 AM

## 2015-11-13 NOTE — Progress Notes (Addendum)
[facility] PROGRESS NOTE    Progress Note   Anna Henson [id number] DOB: [birth year] DOA: 11/11/2015 PCP: Leotis Pain, DO   Brief Narrative:   Anna Henson is an 45 y.o. female with past medical history of hypertension CAD status post stent, chronic in the 60s to represent with numbness on the right.  Assessment/Plan:   Numbness on the right arm and face: Differential is broad MS transfer myelitis, unlikely stroke also the Devic syndrome is a possibility. LP results are still pending. Continue Zofran for nausea narcotics for pain.    Acute kidney injury: likely prerenal creatinine of baseline. Cont. hold nephrotoxic drugs.  Essential hypertension: Continue metoprolol orally and hydralazine IV when necessary.     HLD (hyperlipidemia) Continue statins.  Hypokalemia: Cont to replete.MAg 2.1    CAD (coronary artery disease), native coronary artery Hold aspirin and con metoprolol and statins.       DVT Prophylaxis - Lovenox ordered.  Family Communication: none Disposition Plan: Home in 5-6 days Code Status:     Code Status Orders        Start     Ordered   11/12/15 0308  Full code   Continuous     11/12/15 0309    Code Status History    Date Active Date Inactive Code Status Order ID Comments User Context   09/14/2014 10:39 AM 09/16/2014  3:51 PM Full Code [id number]  Burnell Blanks, MD Inpatient   09/13/2014  6:39 AM 09/14/2014 10:39 AM Full Code [id number]  Ivor Costa, MD Inpatient        IV Access:    Peripheral IV   Procedures and diagnostic studies:   Ct Soft Tissue Neck W Contrast  11/11/2015  CLINICAL DATA:  Right facial numbness. Throat pain and drooling. The patient has been on antibiotics for 5 days without relief. EXAM: CT NECK WITH CONTRAST TECHNIQUE: Multidetector CT imaging of the neck was performed using the standard protocol following the bolus administration of intravenous contrast. CONTRAST:  52mL OMNIPAQUE IOHEXOL 300  MG/ML  SOLN COMPARISON:  None. FINDINGS: Pharynx and larynx: With there is moderate prominence of the adenoid tissue on lingual tonsils bilaterally. The mild prominence the palatine tonsils. No discrete abscess is or fluid collection is present. The epiglottis is within normal limits. Salivary glands: The submandibular and parotid glands are within normal limits bilaterally. Thyroid: Within normal limits. Lymph nodes: Bilateral sub cm reactive type lymph nodes are present. Vascular: Atherosclerotic calcifications are present at the carotid bifurcations bilaterally without significant stenosis. Limited intracranial: Within normal limits Visualized orbits: Not imaged Mastoids and visualized paranasal sinuses: Clear Skeleton: Mild endplate degenerative change in uncovertebral spurring is present at C5-6 and C6-7. Osseous foraminal narrowing is worse on the right at C5-6 and on the left at C6-7. There is reversal of the normal cervical lordosis. Upper chest: The lung apices are clear. IMPRESSION: 1. Mild prominence of the adenoid tissue and lingual tonsils without a discrete fluid collection or abscess. 2. Reactive type bilateral level 2 and level 3 lymph nodes. 3. The airway is patent.  No focal mass lesion is evident. Electronically Signed   By: San Morelle M.D.   On: 11/11/2015 11:58   Ct Chest Wo Contrast  11/12/2015  CLINICAL DATA:  Sarcoidosis EXAM: CT CHEST WITHOUT CONTRAST TECHNIQUE: Multidetector CT imaging of the chest was performed following the standard protocol without IV contrast. COMPARISON:  CTA chest dated 09/13/2014 FINDINGS: Mediastinum/Nodes: The heart is oval size.  No pericardial effusion.  Age advanced coronary atherosclerosis in the right coronary artery (series 2/ image 40). No suspicious mediastinal lymphadenopathy. Visualized thyroid is unremarkable. Lungs/Pleura: 4 mm subpleural nodule in the medial right upper lobe (series 3/image 20), unchanged from 2015, benign. Additional mild  subpleural nodularity along the left fissure measuring up to 4 mm (series 3/ image 29-30), unchanged since 2015, benign. No findings specific for sarcoidosis. No focal consolidation. No pleural effusion or pneumothorax. Upper abdomen: Visualized abdomen is notable for excretory contrast (likely gadolinium) within the gallbladder. Musculoskeletal: Visualized osseous structures are within normal limits. IMPRESSION: No findings specific for sarcoidosis. Stable mild subpleural nodularity measuring up to 4 mm, unchanged since 2015, benign. No evidence of acute cardiopulmonary disease. Age advanced coronary atherosclerosis in the right coronary artery. Electronically Signed   By: Julian Hy M.D.   On: 11/12/2015 16:11   Mr Brain Wo Contrast  11/12/2015  CLINICAL DATA:  RIGHT facial and arm numbness for 1 week associated with neck pain. History of prominent lymph nodes in the neck, hypertension, hyperlipidemia. EXAM: MRI HEAD WITHOUT CONTRAST MRI CERVICAL SPINE WITHOUT AND WITH CONTRAST TECHNIQUE: Multiplanar, multiecho pulse sequences of the brain and surrounding structures were obtained without and with intravenous contrast. Multiplanar multi echo pulse sequences of the Cervical spine, to include the craniocervical junction and cervicothoracic junction, were obtained without and with intravenous contrast. CONTRAST:  64mL MULTIHANCE GADOBENATE DIMEGLUMINE 529 MG/ML IV SOLN COMPARISON:  None.  Judeen Hammans UPSTILL PA FINDINGS: MRI HEAD FINDINGS The ventricles and sulci are normal for patient's age. No abnormal parenchymal signal, mass lesions, mass effect. A few scattered subcentimeter supratentorial subcortical and deep white matter T2 hyperintensities. No reduced diffusion to suggest acute ischemia. No susceptibility artifact to suggest hemorrhage. No abnormal extra-axial fluid collections. No extra-axial masses though, contrast enhanced sequences would be more sensitive. Normal major intracranial vascular flow voids  seen at the skull base.Ocular globes and orbital contents are unremarkable though not tailored for evaluation. No abnormal sellar expansion. No suspicious calvarial bone marrow signal. Craniocervical junction maintained. Visualized paranasal sinuses and mastoid air cells are well-aerated. Prominent nasopharyngeal soft tissues can be seen with recent viral illness and, immunocompromised states. MRI CERVICAL SPINE FINDINGS Cervical vertebral bodies and posterior elements intact and aligned with straightened cervical lordosis. Moderate C6-7 disc height loss, mild at C5-6 with decreased T2 signal within the cervical disc compatible with mild desiccation. Mild chronic discogenic endplate changes 075-GRM and C6-7 without STIR signal abnormality to suggest acute osseous process. 7 mm enhancing nodule within RIGHT dorsal column of the spinal cord, at the craniocervical junction. Mildly expansile edema extend to level of C4-5 without syrinx. No surrounding susceptibility artifact to suggest hemorrhage, no reduced diffusion to suggest hypercellular tumor or acute infarct. No suspicious leptomeningeal or epidural enhancement. Included prevertebral and paraspinal soft tissues are nonacute; suspected thyromegaly. Level by level evaluation: C2-3: No disc bulge, canal stenosis nor neural foraminal narrowing. C3-4: Small cyst LEFT central disc protrusion, mild uncovertebral hypertrophy. Mild canal stenosis without neural foraminal narrowing. C4-5: Small LEFT central to subarticular disc protrusion. Uncovertebral hypertrophy. Very mild canal stenosis. Mild LEFT neural foraminal narrowing. C5-6: 2 mm broad-based disc bulge, uncovertebral hypertrophy. Mild to moderate canal stenosis. Moderate RIGHT greater than LEFT neural foraminal narrowing. C6-7: 2 mm broad-based disc bulge with superimposed moderate to large LEFT subarticular to extra foraminal disc protrusion. Uncovertebral hypertrophy. Mild canal stenosis. Severe LEFT neural  foraminal narrowing. C7-T1: No disc bulge, canal stenosis nor neural foraminal narrowing. IMPRESSION: MRI HEAD: No acute intracranial process.  Minimal white matter changes can be seen with chronic small vessel ischemic disease, atypical in distribution for demyelination. MRI CERVICAL SPINE: 7 mm enhancing lesion RIGHT dorsal column at proximal cervical spine, with fixed surrounding edema to the level C4-5. Differential diagnosis includes atypical demyelination, subacute infarct, less likely tumor such is ependymoma/astrocytoma. Recommend correlation with CSF sampling. Straightened cervical lordosis without acute fracture malalignment. Degenerative cervical spine. Mild to moderate canal stenosis at C5-6. Neural foraminal narrowing C4-5 through C6-7: Severe on the LEFT at C6-7. Acute findings discussed with and reconfirmed by Frederich Balding PA on 11/12/2015 at 2:20 am. Electronically Signed   By: Elon Alas M.D.   On: 11/12/2015 02:22   Mr Cervical Spine W Wo Contrast  11/12/2015  CLINICAL DATA:  RIGHT facial and arm numbness for 1 week associated with neck pain. History of prominent lymph nodes in the neck, hypertension, hyperlipidemia. EXAM: MRI HEAD WITHOUT CONTRAST MRI CERVICAL SPINE WITHOUT AND WITH CONTRAST TECHNIQUE: Multiplanar, multiecho pulse sequences of the brain and surrounding structures were obtained without and with intravenous contrast. Multiplanar multi echo pulse sequences of the Cervical spine, to include the craniocervical junction and cervicothoracic junction, were obtained without and with intravenous contrast. CONTRAST:  11mL MULTIHANCE GADOBENATE DIMEGLUMINE 529 MG/ML IV SOLN COMPARISON:  None.  Judeen Hammans UPSTILL PA FINDINGS: MRI HEAD FINDINGS The ventricles and sulci are normal for patient's age. No abnormal parenchymal signal, mass lesions, mass effect. A few scattered subcentimeter supratentorial subcortical and deep white matter T2 hyperintensities. No reduced diffusion to suggest  acute ischemia. No susceptibility artifact to suggest hemorrhage. No abnormal extra-axial fluid collections. No extra-axial masses though, contrast enhanced sequences would be more sensitive. Normal major intracranial vascular flow voids seen at the skull base.Ocular globes and orbital contents are unremarkable though not tailored for evaluation. No abnormal sellar expansion. No suspicious calvarial bone marrow signal. Craniocervical junction maintained. Visualized paranasal sinuses and mastoid air cells are well-aerated. Prominent nasopharyngeal soft tissues can be seen with recent viral illness and, immunocompromised states. MRI CERVICAL SPINE FINDINGS Cervical vertebral bodies and posterior elements intact and aligned with straightened cervical lordosis. Moderate C6-7 disc height loss, mild at C5-6 with decreased T2 signal within the cervical disc compatible with mild desiccation. Mild chronic discogenic endplate changes 075-GRM and C6-7 without STIR signal abnormality to suggest acute osseous process. 7 mm enhancing nodule within RIGHT dorsal column of the spinal cord, at the craniocervical junction. Mildly expansile edema extend to level of C4-5 without syrinx. No surrounding susceptibility artifact to suggest hemorrhage, no reduced diffusion to suggest hypercellular tumor or acute infarct. No suspicious leptomeningeal or epidural enhancement. Included prevertebral and paraspinal soft tissues are nonacute; suspected thyromegaly. Level by level evaluation: C2-3: No disc bulge, canal stenosis nor neural foraminal narrowing. C3-4: Small cyst LEFT central disc protrusion, mild uncovertebral hypertrophy. Mild canal stenosis without neural foraminal narrowing. C4-5: Small LEFT central to subarticular disc protrusion. Uncovertebral hypertrophy. Very mild canal stenosis. Mild LEFT neural foraminal narrowing. C5-6: 2 mm broad-based disc bulge, uncovertebral hypertrophy. Mild to moderate canal stenosis. Moderate RIGHT  greater than LEFT neural foraminal narrowing. C6-7: 2 mm broad-based disc bulge with superimposed moderate to large LEFT subarticular to extra foraminal disc protrusion. Uncovertebral hypertrophy. Mild canal stenosis. Severe LEFT neural foraminal narrowing. C7-T1: No disc bulge, canal stenosis nor neural foraminal narrowing. IMPRESSION: MRI HEAD: No acute intracranial process. Minimal white matter changes can be seen with chronic small vessel ischemic disease, atypical in distribution for demyelination. MRI CERVICAL SPINE: 7 mm enhancing lesion RIGHT  dorsal column at proximal cervical spine, with fixed surrounding edema to the level C4-5. Differential diagnosis includes atypical demyelination, subacute infarct, less likely tumor such is ependymoma/astrocytoma. Recommend correlation with CSF sampling. Straightened cervical lordosis without acute fracture malalignment. Degenerative cervical spine. Mild to moderate canal stenosis at C5-6. Neural foraminal narrowing C4-5 through C6-7: Severe on the LEFT at C6-7. Acute findings discussed with and reconfirmed by Frederich Balding PA on 11/12/2015 at 2:20 am. Electronically Signed   By: Elon Alas M.D.   On: 11/12/2015 02:22   Dg Fluoro Guide Lumbar Puncture  11/12/2015  CLINICAL DATA:  Possible myelitis. EXAM: DIAGNOSTIC LUMBAR PUNCTURE UNDER FLUOROSCOPIC GUIDANCE FLUOROSCOPY TIME:  Fluoroscopy Time (in minutes and seconds): 1 minutes and 36 seconds Number of Acquired Images:  None PROCEDURE: Informed consent was obtained from the patient prior to the procedure, including potential complications of headache, allergy, and pain. With the patient prone, the lower back was prepped with Betadine. 1% Lidocaine was used for local anesthesia. The L4-5 level was localized and numbed with 1% lidocaine. Upon insertion of the needle, the patient became uncomfortable and anxious. After 2 attempts at repositioning the needle, the patient refused further manipulation and demanded  that the procedure was stopped. IMPRESSION: Aborted lumbar puncture, as detailed above, at patient request. Electronically Signed   By: Abigail Miyamoto M.D.   On: 11/12/2015 16:27     Medical Consultants:    None.  Anti-Infectives:   Anti-infectives    None      Subjective:    Anna Henson continue his complain of numbness in the right arm.  Objective:    Filed Vitals:   11/12/15 1316 11/12/15 1744 11/12/15 2151 11/13/15 0529  BP: 189/88 182/87 148/80 116/59  Pulse: 63 74 90 74  Temp: 97.6 F (36.4 C)  98.2 F (36.8 C) 98.8 F (37.1 C)  TempSrc: Oral  Oral Oral  Resp: 20  18 20   Height:      Weight:      SpO2: 97%  100% 98%    Intake/Output Summary (Last 24 hours) at 11/13/15 0936 Last data filed at 11/13/15 0600  Gross per 24 hour  Intake 2469.66 ml  Output   1200 ml  Net 1269.66 ml   Filed Weights   11/11/15 2158 11/12/15 0505  Weight: 74.503 kg (164 lb 4 oz) 73.6 kg (162 lb 4.1 oz)    Exam: Gen:  NAD Cardiovascular:  RRR, No M/R/G Chest and lungs:   CTAB Abdomen:  Abdomen soft, NT/ND, + BS Extremities:  No edema Neurological exam she: She is awake alert and oriented 3 coherent for language 2-12 are grossly intact sensation is decreased on her right arm up to her shoulder, muscle strength is hormonal 4 extremities deep tendon reflexes 2+ symmetrical and bilaterally.   Data Reviewed:    Labs: Basic Metabolic Panel:  Recent Labs Lab 11/11/15 2330 11/12/15 0351 11/13/15 0635  NA 140 140 136  K 3.3* 3.7 3.2*  CL 100* 103 102  CO2  --  25 22  GLUCOSE 99 107* 140*  BUN 13 12 11   CREATININE 1.20* 1.25* 1.13*  CALCIUM  --  9.3 8.8*  MG  --  2.1  --    GFR Estimated Creatinine Clearance: 58.9 mL/min (by C-G formula based on Cr of 1.13). Liver Function Tests:  Recent Labs Lab 11/12/15 0351  AST 30  ALT 28  ALKPHOS 51  BILITOT 0.5  PROT 6.2*  ALBUMIN 3.6   No results for  input(s): LIPASE, AMYLASE in the last 168 hours. No results  for input(s): AMMONIA in the last 168 hours. Coagulation profile  Recent Labs Lab 11/12/15 0350  INR 1.03    CBC:  Recent Labs Lab 11/07/15 0931 11/11/15 2330 11/12/15 0351  WBC 10.1  --  7.0  HGB 12.6 12.2 12.5  HCT 34.9* 36.0 36.0  MCV 90.3  --  91.4  PLT  --   --  331   Cardiac Enzymes: No results for input(s): CKTOTAL, CKMB, CKMBINDEX, TROPONINI in the last 168 hours. BNP (last 3 results) No results for input(s): PROBNP in the last 8760 hours. CBG:  Recent Labs Lab 11/12/15 0501 11/12/15 0737 11/13/15 0750  GLUCAP 102* 102* 169*   D-Dimer: No results for input(s): DDIMER in the last 72 hours. Hgb A1c: No results for input(s): HGBA1C in the last 72 hours. Lipid Profile: No results for input(s): CHOL, HDL, LDLCALC, TRIG, CHOLHDL, LDLDIRECT in the last 72 hours. Thyroid function studies: No results for input(s): TSH, T4TOTAL, T3FREE, THYROIDAB in the last 72 hours.  Invalid input(s): FREET3 Anemia work up:  Recent Labs  11/12/15 [place] 15.6   Sepsis Labs:  Recent Labs Lab 11/07/15 0931 11/12/15 0351  WBC 10.1 7.0   Microbiology No results found for this or any previous visit (from the past 240 hour(s)).   Medications:   . aspirin  81 mg Oral Daily  . atorvastatin  80 mg Oral Daily  . methylPREDNISolone (SOLU-MEDROL) injection  500 mg Intravenous Q12H  . metoprolol  50 mg Oral BID  . potassium chloride  40 mEq Oral BID  . sodium chloride flush  3 mL Intravenous Q12H   Continuous Infusions: . sodium chloride 100 mL/hr at 11/13/15 0524    Time spent: 15 min   LOS: 1 day   Charlynne Cousins  [facility] Pager [phone]  *Please refer to [email or web address], password [email or web address] to get updated schedule on who will round on this patient, as hospitalists switch teams weekly. If 7PM-7AM, please contact night-coverage at [email or web address], password [email or web address] for any overnight needs.  11/13/2015, 9:36 AM

## 2015-11-14 LAB — BASIC METABOLIC PANEL
Anion gap: 13 (ref 5–15)
BUN: 17 mg/dL (ref 6–20)
CO2: 22 mmol/L (ref 22–32)
Calcium: 8.4 mg/dL — ABNORMAL LOW (ref 8.9–10.3)
Chloride: 102 mmol/L (ref 101–111)
Creatinine, Ser: 1.22 mg/dL — ABNORMAL HIGH (ref 0.44–1.00)
GFR calc Af Amer: >60 mL/min (ref >60)
GFR calc non Af Amer: 53 mL/min — ABNORMAL LOW (ref >60)
Glucose, Bld: 178 mg/dL — ABNORMAL HIGH (ref 65–99)
Potassium: 3.8 mmol/L (ref 3.5–5.1)
Sodium: 137 mmol/L (ref 135–145)

## 2015-11-14 LAB — GLUCOSE, CAPILLARY: Glucose-Capillary: 136 mg/dL — ABNORMAL HIGH (ref 65–99)

## 2015-11-14 MED ORDER — POLYETHYLENE GLYCOL 3350 17 G PO PACK
17.0000 g | PACK | Freq: Two times a day (BID) | ORAL | Status: DC
Start: 2015-11-14 — End: 2015-11-17
  Administered 2015-11-14 – 2015-11-16 (×5): 17 g via ORAL
  Filled 2015-11-14 (×5): qty 1

## 2015-11-14 NOTE — Progress Notes (Signed)
Subjective: She feels she is starting to have some improvement  Exam: Filed Vitals:   11/14/15 1022 11/14/15 1505  BP: 134/61 134/66  Pulse: 72 68  Temp:  97.9 F (36.6 C)  Resp:  16   Gen: In bed, NAD Resp: non-labored breathing, no acute distress Abd: soft, nt  Neuro: MS: awake, alert,  [id number] Motor: MAEW Sensory: reduced in right arm and neck, sparing the face.   HIV negative SSA, SSB - negative  Pending: CSF studies ANCA, ANA, NMO   Impression: 45 yo F with inflammatory cord changes as evidenced by enhancement. 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   Differential diagnosis includes multiple sclerosis, and neuromyelitis optica, infectious myelitis, autoimmune disease, metabolic myelopathy.   I think infectious is very unlikley with no fever or other signs of infection. Onset is fairly abrupt for a metabolic process and the subacute onset is not consistent with cord infarct.   She agrees that she will re-attempt an LP under radiology.   Recommendations: 1) Lumbar puncture under fluoro when possible.  2) await pending labs.   Roland Rack, MD [facility] [phone]  If 7pm- 7am, please page neurology on call as listed in [place].

## 2015-11-14 NOTE — Progress Notes (Signed)
[facility] PROGRESS NOTE    Progress Note   Anna Henson [id number] DOB: [birth year] DOA: 11/11/2015 PCP: Leotis Pain, DO   Brief Narrative:   Anna Henson is an 45 y.o. female with past medical history of hypertension CAD status post stent, chronic in the 60s to represent with numbness on the right.  Assessment/Plan:   Numbness on the right arm and face: Differential is broad MS transfer myelitis, unlikely stroke, also the Devic syndrome is a possibility. LP results are still pending. Continue Zofran for nausea narcotics for pain. She relates her pain is better and her numbness is improving.    Chronic kidney disease stage II: Creatinine at baseline.   Essential hypertension: Continue metoprolol orally and hydralazine IV when necessary.     HLD (hyperlipidemia) Continue statins.  Hypokalemia: Resolved.    CAD (coronary artery disease), native coronary artery Hold aspirin and con metoprolol and statins.       DVT Prophylaxis - Lovenox ordered.  Family Communication: none Disposition Plan: Home in 5 days Code Status:     Code Status Orders        Start     Ordered   2015-11-29 0308  Full code   Continuous     29-Nov-2015 0309    Code Status History    Date Active Date Inactive Code Status Order ID Comments User Context   09/14/2014 10:39 AM 09/16/2014  3:51 PM Full Code [id number]  Burnell Blanks, MD Inpatient   09/13/2014  6:39 AM 09/14/2014 10:39 AM Full Code [id number]  Ivor Costa, MD Inpatient        IV Access:    Peripheral IV   Procedures and diagnostic studies:   Ct Chest Wo Contrast  2015-11-29  CLINICAL DATA:  Sarcoidosis EXAM: CT CHEST WITHOUT CONTRAST TECHNIQUE: Multidetector CT imaging of the chest was performed following the standard protocol without IV contrast. COMPARISON:  CTA chest dated 09/13/2014 FINDINGS: Mediastinum/Nodes: The heart is oval size.  No pericardial effusion. Age advanced coronary atherosclerosis in the  right coronary artery (series 2/ image 16). No suspicious mediastinal lymphadenopathy. Visualized thyroid is unremarkable. Lungs/Pleura: 4 mm subpleural nodule in the medial right upper lobe (series 3/image 20), unchanged from 2015, benign. Additional mild subpleural nodularity along the left fissure measuring up to 4 mm (series 3/ image 29-30), unchanged since 2015, benign. No findings specific for sarcoidosis. No focal consolidation. No pleural effusion or pneumothorax. Upper abdomen: Visualized abdomen is notable for excretory contrast (likely gadolinium) within the gallbladder. Musculoskeletal: Visualized osseous structures are within normal limits. IMPRESSION: No findings specific for sarcoidosis. Stable mild subpleural nodularity measuring up to 4 mm, unchanged since 2015, benign. No evidence of acute cardiopulmonary disease. Age advanced coronary atherosclerosis in the right coronary artery. Electronically Signed   By: Julian Hy M.D.   On: 11-29-2015 16:11   Mr Thoracic Spine W Wo Contrast  11/13/2015  CLINICAL DATA:  45 year old female with enhancing and edematous upper cervical spinal cord lesion. Differential diagnosis of demyelination, inflammatory lesion, tumor (astrocytoma), and subacute infarct. Right facial and arm numbness with neck pain for 1 week. Subsequent encounter. EXAM: MRI THORACIC SPINE WITHOUT AND WITH CONTRAST TECHNIQUE: Multiplanar and multiecho pulse sequences of the thoracic spine were obtained without and with intravenous contrast. CONTRAST:  70mL MULTIHANCE GADOBENATE DIMEGLUMINE 529 MG/ML IV SOLN COMPARISON:  Cervical spine and brain MRI without and with contrast 11-29-2015. FINDINGS: Cervical spine findings reported yesterday. Normal thoracic vertebral height and alignment. No marrow edema or evidence of  acute osseous abnormality. Normal visible bone marrow signal. Negative visualized paraspinal soft tissues aside from mild subcutaneous edema in the visible upper lumbar  spine. Thyromegaly, no discrete thyroid nodule. Negative visualized thoracic and upper abdominal viscera. The thoracic spinal cord is within normal limits at all visualized levels. From the cervicothoracic junction to the conus medullaris which is at L1 there is normal spinal cord signal and morphology. No abnormal intradural enhancement on this exam. Visible cauda equina nerve roots appear normal. Normal thoracic intervertebral discs. No thoracic degenerative changes identified. IMPRESSION: 1. Normal thoracic spine and thoracic spinal cord. 2. 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 Electronically Signed   By: Genevie Ann M.D.   On: 11/13/2015 17:38   Dg Fluoro Guide Lumbar Puncture  11/12/2015  CLINICAL DATA:  Possible myelitis. EXAM: DIAGNOSTIC LUMBAR PUNCTURE UNDER FLUOROSCOPIC GUIDANCE FLUOROSCOPY TIME:  Fluoroscopy Time (in minutes and seconds): 1 minutes and 36 seconds Number of Acquired Images:  None PROCEDURE: Informed consent was obtained from the patient prior to the procedure, including potential complications of headache, allergy, and pain. With the patient prone, the lower back was prepped with Betadine. 1% Lidocaine was used for local anesthesia. The L4-5 level was localized and numbed with 1% lidocaine. Upon insertion of the needle, the patient became uncomfortable and anxious. After 2 attempts at repositioning the needle, the patient refused further manipulation and demanded that the procedure was stopped. IMPRESSION: Aborted lumbar puncture, as detailed above, at patient request. Electronically Signed   By: Abigail Miyamoto M.D.   On: 11/12/2015 16:27     Medical Consultants:    None.  Anti-Infectives:   Anti-infectives    None      Subjective:    Anna Henson she relates her pain and numbness in her neck are improved compared to yesterday.  Objective:    Filed Vitals:   11/13/15 1020 11/13/15 1415 11/13/15 2138 11/14/15 0548  BP: 124/75 114/58 154/76 119/68  Pulse: 70 85 85 76    Temp:  98.2 F (36.8 C) 98.3 F (36.8 C) 98.2 F (36.8 C)  TempSrc:  Oral Oral Oral  Resp:  18 16 16   Height:      Weight:      SpO2:  98% 100% 97%    Intake/Output Summary (Last 24 hours) at 11/14/15 0846 Last data filed at 11/14/15 0550  Gross per 24 hour  Intake    744 ml  Output   1700 ml  Net   -956 ml   Filed Weights   11/11/15 2158 11/12/15 0505  Weight: 74.503 kg (164 lb 4 oz) 73.6 kg (162 lb 4.1 oz)    Exam: Gen:  NAD Cardiovascular:  RRR, No M/R/G Chest and lungs:   CTAB Abdomen:  Abdomen soft, NT/ND, + BS Extremities:  No edema Neurological exam she: She is awake alert and oriented 3 coherent for language 2-12 are grossly intact sensation is decreased on her right arm up to her shoulder, muscle strength is hormonal 4 extremities deep tendon reflexes 2+ symmetrical and bilaterally.   Data Reviewed:    Labs: Basic Metabolic Panel:  Recent Labs Lab 11/11/15 2330 11/12/15 0351 11/13/15 0635 11/14/15 0609  NA 140 140 136 137  K 3.3* 3.7 3.2* 3.8  CL 100* 103 102 102  CO2  --  25 22 22   GLUCOSE 99 107* 140* 178*  BUN 13 12 11 17   CREATININE 1.20* 1.25* 1.13* 1.22*  CALCIUM  --  9.3 8.8* 8.4*  MG  --  2.1  --   --    GFR Estimated Creatinine Clearance: 54.5 mL/min (by C-G formula based on Cr of 1.22). Liver Function Tests:  Recent Labs Lab 11/12/15 0351  AST 30  ALT 28  ALKPHOS 51  BILITOT 0.5  PROT 6.2*  ALBUMIN 3.6   No results for input(s): LIPASE, AMYLASE in the last 168 hours. No results for input(s): AMMONIA in the last 168 hours. Coagulation profile  Recent Labs Lab 11/12/15 0350  INR 1.03    CBC:  Recent Labs Lab 11/07/15 0931 11/11/15 2330 11/12/15 0351  WBC 10.1  --  7.0  HGB 12.6 12.2 12.5  HCT 34.9* 36.0 36.0  MCV 90.3  --  91.4  PLT  --   --  331   Cardiac Enzymes: No results for input(s): CKTOTAL, CKMB, CKMBINDEX, TROPONINI in the last 168 hours. BNP (last 3 results) No results for input(s): PROBNP in  the last 8760 hours. CBG:  Recent Labs Lab 11/12/15 0501 11/12/15 0737 11/13/15 0750 11/14/15 0811  GLUCAP 102* 102* 169* 136*   D-Dimer: No results for input(s): DDIMER in the last 72 hours. Hgb A1c: No results for input(s): HGBA1C in the last 72 hours. Lipid Profile: No results for input(s): CHOL, HDL, LDLCALC, TRIG, CHOLHDL, LDLDIRECT in the last 72 hours. Thyroid function studies: No results for input(s): TSH, T4TOTAL, T3FREE, THYROIDAB in the last 72 hours.  Invalid input(s): FREET3 Anemia work up:  Recent Labs  11/12/15 [place] 15.6   Sepsis Labs:  Recent Labs Lab 11/07/15 0931 11/12/15 0351  WBC 10.1 7.0   Microbiology No results found for this or any previous visit (from the past 240 hour(s)).   Medications:   . atorvastatin  80 mg Oral Daily  . methylPREDNISolone (SOLU-MEDROL) injection  500 mg Intravenous Q12H  . metoprolol  50 mg Oral BID  . sodium chloride flush  3 mL Intravenous Q12H   Continuous Infusions:    Time spent: 15 min   LOS: 2 days   Charlynne Cousins  [facility] Pager [phone]  *Please refer to [email or web address], password [email or web address] to get updated schedule on who will round on this patient, as hospitalists switch teams weekly. If 7PM-7AM, please contact night-coverage at [email or web address], password [email or web address] for any overnight needs.  11/14/2015, 8:46 AM

## 2015-11-15 ENCOUNTER — Inpatient Hospital Stay (HOSPITAL_COMMUNITY): Payer: Federal, State, Local not specified - PPO

## 2015-11-15 LAB — ANTINUCLEAR ANTIBODIES, IFA: ANA Ab, IFA: NEGATIVE

## 2015-11-15 LAB — GLUCOSE, CSF: Glucose, CSF: 96 mg/dL — ABNORMAL HIGH (ref 40–70)

## 2015-11-15 LAB — PROTEIN, CSF: Total  Protein, CSF: 31 mg/dL (ref 15–45)

## 2015-11-15 LAB — CSF CELL COUNT WITH DIFFERENTIAL
RBC Count, CSF: 273 /mm3 — ABNORMAL HIGH
Tube #: 1
WBC, CSF: 4 /mm3 (ref 0–5)

## 2015-11-15 LAB — BASIC METABOLIC PANEL
Anion gap: 10 (ref 5–15)
BUN: 16 mg/dL (ref 6–20)
CO2: 25 mmol/L (ref 22–32)
Calcium: 8.5 mg/dL — ABNORMAL LOW (ref 8.9–10.3)
Chloride: 104 mmol/L (ref 101–111)
Creatinine, Ser: 1.02 mg/dL — ABNORMAL HIGH (ref 0.44–1.00)
GFR calc Af Amer: >60 mL/min (ref >60)
GFR calc non Af Amer: >60 mL/min (ref >60)
Glucose, Bld: 142 mg/dL — ABNORMAL HIGH (ref 65–99)
Potassium: 4.2 mmol/L (ref 3.5–5.1)
Sodium: 139 mmol/L (ref 135–145)

## 2015-11-15 LAB — GLUCOSE, CAPILLARY
Glucose-Capillary: 149 mg/dL — ABNORMAL HIGH (ref 65–99)
Glucose-Capillary: 125 mg/dL — ABNORMAL HIGH (ref 65–99)

## 2015-11-15 LAB — NEUROMYELITIS OPTICA AUTOAB, IGG: NMO-IgG: <1.5 U/mL (ref 0.0–3.0)

## 2015-11-15 LAB — ANGIOTENSIN CONVERTING ENZYME: Angiotensin-Converting Enzyme: 42 U/L (ref 14–82)

## 2015-11-15 MED ORDER — LORAZEPAM 2 MG/ML IJ SOLN
1.0000 mg | INTRAMUSCULAR | Status: DC | PRN
  Administered 2015-11-15: 1 mg via INTRAVENOUS
  Filled 2015-11-15: qty 1

## 2015-11-15 MED ORDER — LIDOCAINE HCL (PF) 1 % IJ SOLN
INTRAMUSCULAR | Status: AC
  Administered 2015-11-15: 5 mL via INTRAMUSCULAR
  Filled 2015-11-15: qty 5

## 2015-11-15 NOTE — Evaluation (Signed)
Occupational Therapy Evaluation Patient Details Name: Anna Henson MRN: [id number] DOB: Feb 19, 1971 Today's Date: 11/15/2015    History of Present Illness Pt adm with numbness of RUE, face, and lt shoulder. MRI of head negative but MRI of cervical spine showed edema and demyelination of the cervical spine. Workup in progress for MS vs transverse myelitis.   Clinical Impression   Pt admitted with the above diagnosis and has the minor deficits in her RUE listed below. Pt has some S at home from mother who lives below her.  Pt has numbness in RUE that affects her fine motor skills but not her strength.  Pt does feel it is improving.  Theraputty provided to pt as well as a fine motor HEP to work on improving skills in the [place].  Feel pt will be safe to dc home and if the fine motor skills do not return quickly, pt may benefit from OPOT to assist her in regaining skills that will assist her in returning to work.      Follow Up Recommendations  Outpatient OT;Other (comment);Supervision - Intermittent (but only if pt does not improve.  HEP provided for now.)    Equipment Recommendations  None recommended by OT    Recommendations for Other Services       Precautions / Restrictions Precautions Precautions: None Restrictions Weight Bearing Restrictions: No      Mobility Bed Mobility Overal bed mobility: Independent                Transfers Overall transfer level: Independent Equipment used: None                  Balance Overall balance assessment: Independent                                          ADL Overall ADL's : At baseline (close to baseline.  Some fine motor deficits but compensates)                                       General ADL Comments: Pt completes all basic adls w/o assist.  Pt is clumsy at times while using the RUE when eating or brushing teeth.  Pt does work full time and needs to able to write, use computer etc.       Vision Vision Assessment?: No apparent visual deficits   Perception     Praxis      Pertinent Vitals/Pain Pain Assessment: No/denies pain     Hand Dominance Right   Extremity/Trunk Assessment Upper Extremity Assessment Upper Extremity Assessment: RUE deficits/detail RUE Deficits / Details: Pt with full strength and ROM in this arm.  Pt feels deep pressure but does not discriminate well with light touch.   RUE Sensation: decreased light touch;decreased proprioception RUE Coordination: decreased fine motor   Lower Extremity Assessment Lower Extremity Assessment: Defer to PT evaluation   Cervical / Trunk Assessment Cervical / Trunk Assessment: Normal   Communication Communication Communication: No difficulties   Cognition Arousal/Alertness: Awake/alert Behavior During Therapy: WFL for tasks assessed/performed Overall Cognitive Status: Within Functional Limits for tasks assessed                     General Comments       Exercises Exercises: Other exercises Other Exercises Other  Exercises: Pt provided with theraputty and exercises explained.  Home program to be provided. Pt was provided with fine motor coordination handout with exercises to perform here and at home.   Shoulder Instructions      Home Living Family/patient expects to be discharged to:: Private residence Living Arrangements: Alone Available Help at Discharge: Family;Available PRN/intermittently Type of Home: Apartment Home Access: Stairs to enter Entrance Stairs-Number of Steps: 1 flight Entrance Stairs-Rails: Right Home Layout: One level     Bathroom Shower/Tub: Tub/shower unit;Curtain Shower/tub characteristics: Architectural technologist: Standard     Home Equipment: None          Prior Functioning/Environment Level of Independence: Independent        Comments: works in [employer] and drives there daily.    OT Diagnosis: Generalized weakness   OT Problem List:  Decreased coordination;Impaired UE functional use   OT Treatment/Interventions: Therapeutic exercise    OT Goals(Current goals can be found in the care plan section) Acute Rehab OT Goals Patient Stated Goal: to go home. OT Goal Formulation: With patient Time For Goal Achievement: 11/22/15 Potential to Achieve Goals: Good ADL Goals Additional ADL Goal #1: Pt will be independent with RUE HEP for increasing fine motor skills in R hand to increase independence with IADLS.  OT Frequency: Min 2X/week   Barriers to D/C:            Co-evaluation              End of Session Nurse Communication: Mobility status;Other (comment) (need for pt to walk in hallways)  Activity Tolerance: Patient tolerated treatment well Patient left: in bed;with call bell/phone within reach   Time: 1135-1159 OT Time Calculation (min): 24 min Charges:  OT General Charges $OT Visit: 1 Procedure OT Evaluation $OT Eval Low Complexity: 1 Procedure OT Treatments $Therapeutic Exercise: 8-22 mins G-Codes:    Glenford Peers 2015/12/03, 12:04 PM  [phone]

## 2015-11-15 NOTE — Progress Notes (Signed)
[facility] PROGRESS NOTE    Progress Note   Anna Henson [id number] DOB: 09/10/1971 DOA: 11/11/2015 PCP: Leotis Pain, DO   Brief Narrative:   Anna Henson is an 45 y.o. female with past medical history of hypertension CAD status post stent, chronic in the 60s to represent with numbness on the right.  Assessment/Plan:   Numbness on the right arm and face: Differential is broad MS transfer myelitis, unlikely stroke, also the Devic syndrome is a possibility. LP schedule on 2.6.2017. Continue Zofran for nausea narcotics for pain.  She relates her pain is better and her numbness is improving. Neurology consulted rec solumedrol. ANA, ACE, SSA, SSB and ANCA.    Chronic kidney disease stage II: Creatinine at baseline.  Essential hypertension: Continue metoprolol orally and hydralazine IV when necessary.     HLD (hyperlipidemia) Continue statins.  Hypokalemia: Resolved.    CAD (coronary artery disease), native coronary artery Hold aspirin and con metoprolol and statins.       DVT Prophylaxis - Lovenox ordered.  Family Communication: none Disposition Plan: Home in 3 days Code Status:     Code Status Orders        Start     Ordered   11/12/15 0308  Full code   Continuous     11/12/15 0309    Code Status History    Date Active Date Inactive Code Status Order ID Comments User Context   09/14/2014 10:39 AM 09/16/2014  3:51 PM Full Code [id number]  Burnell Blanks, MD Inpatient   09/13/2014  6:39 AM 09/14/2014 10:39 AM Full Code [id number]  Ivor Costa, MD Inpatient        IV Access:    Peripheral IV   Procedures and diagnostic studies:   Mr Thoracic Spine W Wo Contrast  11/13/2015  CLINICAL DATA:  45 year old female with enhancing and edematous upper cervical spinal cord lesion. Differential diagnosis of demyelination, inflammatory lesion, tumor (astrocytoma), and subacute infarct. Right facial and arm numbness with neck pain for 1 week.  Subsequent encounter. EXAM: MRI THORACIC SPINE WITHOUT AND WITH CONTRAST TECHNIQUE: Multiplanar and multiecho pulse sequences of the thoracic spine were obtained without and with intravenous contrast. CONTRAST:  59mL MULTIHANCE GADOBENATE DIMEGLUMINE 529 MG/ML IV SOLN COMPARISON:  Cervical spine and brain MRI without and with contrast 11/12/2015. FINDINGS: Cervical spine findings reported yesterday. Normal thoracic vertebral height and alignment. No marrow edema or evidence of acute osseous abnormality. Normal visible bone marrow signal. Negative visualized paraspinal soft tissues aside from mild subcutaneous edema in the visible upper lumbar spine. Thyromegaly, no discrete thyroid nodule. Negative visualized thoracic and upper abdominal viscera. The thoracic spinal cord is within normal limits at all visualized levels. From the cervicothoracic junction to the conus medullaris which is at L1 there is normal spinal cord signal and morphology. No abnormal intradural enhancement on this exam. Visible cauda equina nerve roots appear normal. Normal thoracic intervertebral discs. No thoracic degenerative changes identified. IMPRESSION: 1. Normal thoracic spine and thoracic spinal cord. 2. 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 Electronically Signed   By: Genevie Ann M.D.   On: 11/13/2015 17:38     Medical Consultants:    None.  Anti-Infectives:   Anti-infectives    None      Subjective:    [name] she relates her pain and numbness in her neck are unchanged.  Objective:    Filed Vitals:   11/14/15 1505 11/14/15 2102 11/14/15 2228 11/15/15 0549  BP: 134/66 157/78 153/79 145/78  Pulse: 68 70 78 60  Temp: 97.9 F (36.6 C) 99.1 F (37.3 C)  98.4 F (36.9 C)  TempSrc: Oral Oral  Oral  Resp: 16 16  16   Height:      Weight:      SpO2: 97% 99%  98%    Intake/Output Summary (Last 24 hours) at 11/15/15 1041 Last data filed at 11/15/15 0040  Gross per 24 hour  Intake    624 ml  Output      0  ml  Net    624 ml   Filed Weights   11/11/15 2158 11/12/15 0505  Weight: 74.503 kg (164 lb 4 oz) 73.6 kg (162 lb 4.1 oz)    Exam: Gen:  NAD Cardiovascular:  RRR, No M/R/G Chest and lungs:   CTAB Abdomen:  Abdomen soft, NT/ND, + BS Extremities:  No edema Neurological exam she: She is awake alert and oriented 3 .   Data Reviewed:    Labs: Basic Metabolic Panel:  Recent Labs Lab 11/11/15 2330 11/12/15 0351 11/13/15 0635 11/14/15 0609 11/15/15 0726  NA 140 140 136 137 139  K 3.3* 3.7 3.2* 3.8 4.2  CL 100* 103 102 102 104  CO2  --  25 22 22 25   GLUCOSE 99 107* 140* 178* 142*  BUN 13 12 11 17 16   CREATININE 1.20* 1.25* 1.13* 1.22* 1.02*  CALCIUM  --  9.3 8.8* 8.4* 8.5*  MG  --  2.1  --   --   --    GFR Estimated Creatinine Clearance: 65.2 mL/min (by C-G formula based on Cr of 1.02). Liver Function Tests:  Recent Labs Lab 11/12/15 0351  AST 30  ALT 28  ALKPHOS 51  BILITOT 0.5  PROT 6.2*  ALBUMIN 3.6   No results for input(s): LIPASE, AMYLASE in the last 168 hours. No results for input(s): AMMONIA in the last 168 hours. Coagulation profile  Recent Labs Lab 11/12/15 0350  INR 1.03    CBC:  Recent Labs Lab 11/11/15 2330 11/12/15 0351  WBC  --  7.0  HGB 12.2 12.5  HCT 36.0 36.0  MCV  --  91.4  PLT  --  331   Cardiac Enzymes: No results for input(s): CKTOTAL, CKMB, CKMBINDEX, TROPONINI in the last 168 hours. BNP (last 3 results) No results for input(s): PROBNP in the last 8760 hours. CBG:  Recent Labs Lab 11/12/15 0737 11/13/15 0750 11/14/15 0811 11/15/15 0547 11/15/15 0746  GLUCAP 102* 169* 136* 149* 125*   D-Dimer: No results for input(s): DDIMER in the last 72 hours. Hgb A1c: No results for input(s): HGBA1C in the last 72 hours. Lipid Profile: No results for input(s): CHOL, HDL, LDLCALC, TRIG, CHOLHDL, LDLDIRECT in the last 72 hours. Thyroid function studies: No results for input(s): TSH, T4TOTAL, T3FREE, THYROIDAB in the last  72 hours.  Invalid input(s): FREET3 Anemia work up: No results for input(s): VITAMINB12, FOLATE, FERRITIN, TIBC, IRON, RETICCTPCT in the last 72 hours. Sepsis Labs:  Recent Labs Lab 11/12/15 0351  WBC 7.0   Microbiology No results found for this or any previous visit (from the past 240 hour(s)).   Medications:   . atorvastatin  80 mg Oral Daily  . methylPREDNISolone (SOLU-MEDROL) injection  500 mg Intravenous Q12H  . metoprolol  50 mg Oral BID  . polyethylene glycol  17 g Oral BID  . sodium chloride flush  3 mL Intravenous Q12H   Continuous Infusions:    Time spent: 15 min   LOS:  3 days   Charlynne Cousins  [facility] Pager [phone]  *Please refer to [email or web address], password [email or web address] to get updated schedule on who will round on this patient, as hospitalists switch teams weekly. If 7PM-7AM, please contact night-coverage at [email or web address], password [email or web address] for any overnight needs.  11/15/2015, 10:41 AM

## 2015-11-15 NOTE — Progress Notes (Signed)
UR COMPLETED  

## 2015-11-16 LAB — GLUCOSE, CAPILLARY: Glucose-Capillary: 166 mg/dL — ABNORMAL HIGH (ref 65–99)

## 2015-11-16 LAB — ANCA TITERS
Atypical P-ANCA titer: <1:20 {titer}
C-ANCA: <1:20 {titer}
P-ANCA: <1:20 {titer}

## 2015-11-16 LAB — CSF IGG: IgG, CSF: 2.7 mg/dL (ref 0.0–8.6)

## 2015-11-16 NOTE — Progress Notes (Signed)
PT Cancellation Note  Patient Details Name: Anna Henson MRN: [id number] DOB: 1971/05/05   Cancelled Treatment:    Reason Eval/Treat Not Completed: PT screened, no needs identified, will sign off. Pt was evaluated on 2/3 and was independent with mobility at that time. Spoke with pt and OT and pt continues to be independent with all mobility and no PT needed.   Milanni Ayub 11/16/2015, 1:40 PM [place]

## 2015-11-16 NOTE — Progress Notes (Signed)
Occupational Therapy Treatment Patient Details Name: Anna Henson MRN: [id number] DOB: [birth year] Today's Date: 11/16/2015    History of present illness Pt adm with numbness of RUE, face, and lt shoulder. MRI of head negative but MRI of cervical spine showed edema and demyelination of the cervical spine. Workup in progress for MS vs transverse myelitis.   OT comments  Pt is knowledgeable in home exercise program for R hand coordination. Pt continues to use R UE as functional lead, but reports continued impairment of light touch. Pt to continue home program. No further acute OT needs.  Follow Up Recommendations  Outpatient OT (if pt continues to have functional difficulties with R UE)    Equipment Recommendations  None recommended by OT    Recommendations for Other Services      Precautions / Restrictions Precautions Precautions: None Restrictions Weight Bearing Restrictions: No       Mobility Bed Mobility Overal bed mobility: Independent                Transfers Overall transfer level: Independent Equipment used: None                  Balance                                   ADL Overall ADL's : Independent                                       General ADL Comments: Pt demonstrating independence and reports follow through with putty exercises. Observed pt using R UE functionally to open containers, set up tray and self feed with R hand lead without difficulty.  Handwriting is legible, somewhat laborius and not quite back to her baseline.  Pt will continue HEP.      Vision                     Perception     Praxis      Cognition   Behavior During Therapy: WFL for tasks assessed/performed Overall Cognitive Status: Within Functional Limits for tasks assessed                       Extremity/Trunk Assessment               Exercises     Shoulder Instructions       General Comments       Pertinent Vitals/ Pain       Pain Assessment: Faces Faces Pain Scale: Hurts little more Pain Location: head Pain Descriptors / Indicators: Aching Pain Intervention(s): RN gave pain meds during session  Home Living                                          Prior Functioning/Environment              Frequency Min 2X/week     Progress Toward Goals  OT Goals(current goals can now be found in the care plan section)  Progress towards OT goals: Goals met/education completed, patient discharged from [place] Discharge plan remains appropriate    Co-evaluation  End of Session     Activity Tolerance Patient tolerated treatment well   Patient Left in bed;with call bell/phone within reach   Nurse Communication          Time: 9758-8325 OT Time Calculation (min): 14 min  Charges: OT General Charges $OT Visit: 1 Procedure OT Treatments $Therapeutic Exercise: 8-22 mins  Malka So 11/16/2015, 9:03 AM  [phone]

## 2015-11-16 NOTE — Progress Notes (Signed)
[facility] PROGRESS NOTE    Progress Note   Anna Henson [id number] DOB: November 16, 1970 DOA: 11/11/2015 PCP: Leotis Pain, DO   Brief Narrative:   Anna Henson is an 45 y.o. female with past medical history of hypertension CAD status post stent, chronic in the 60s to represent with numbness on the right.  Assessment/Plan:   Numbness on the right arm and face: Differential is broad MS transfer myelitis, unlikely stroke, also the Devic syndrome is a possibility. LP schedule on 2.6.2017. Neurology was consulted they recommended start IV Solu-Medrol pulse course, her last doses on 11/17/2015 in the morning Continue Zofran for nausea narcotics for pain.  She relates her pain is better and her numbness is improving. Neurology consulted rec ACE 42, SSA, SSB,  ANA and ANCA pending. When she finishes her IV Solu-Medrol she'll be discharged and follow-up with neurology as an outpatient in 3 weeks. She'll need a note for the day of discharge with a hospital stay any other follow-ups or interventions as an outpatient.    Chronic kidney disease stage II: Creatinine at baseline.  Essential hypertension: Continue metoprolol orally and hydralazine IV when necessary.     HLD (hyperlipidemia) Continue statins.  Hypokalemia: Resolved.    CAD (coronary artery disease), native coronary artery Hold aspirin and con metoprolol and statins.       DVT Prophylaxis - Lovenox ordered.  Family Communication: none Disposition Plan: Home in 1 day Code Status:     Code Status Orders        Start     Ordered   11/12/15 0308  Full code   Continuous     11/12/15 0309    Code Status History    Date Active Date Inactive Code Status Order ID Comments User Context   09/14/2014 10:39 AM 09/16/2014  3:51 PM Full Code [id number]  Burnell Blanks, MD Inpatient   09/13/2014  6:39 AM 09/14/2014 10:39 AM Full Code [id number]  Ivor Costa, MD Inpatient        IV Access:    Peripheral  IV   Procedures and diagnostic studies:   Dg Fluoro Guide Lumbar Puncture  11/15/2015  CLINICAL DATA:  Right arm numbness.  Enhancing spinal cord lesion. EXAM: DIAGNOSTIC LUMBAR PUNCTURE UNDER FLUOROSCOPIC GUIDANCE FLUOROSCOPY TIME:  Radiation Exposure Index (as provided by the fluoroscopic device): If the device does not provide the exposure index: Fluoroscopy Time (in minutes and seconds):  0 minutes 48 seconds Number of Acquired Images: PROCEDURE: Informed consent was obtained from the patient prior to the procedure, including potential complications of headache, allergy, and pain. With the patient prone, the lower back was prepped with Betadine. 1% Lidocaine was used for local anesthesia. Lumbar puncture was performed at the L4-5 level using a 20 gauge needle with return of clear CSF with an opening pressure of 30 cm water. Nine ml of CSF were obtained for laboratory studies. The patient tolerated the procedure well and there were no apparent complications. IMPRESSION: Successful lumbar puncture. Clear CSF sent for laboratory studies has ordered. Electronically Signed   By: Franchot Gallo M.D.   On: 11/15/2015 14:44     Medical Consultants:    None.  Anti-Infectives:   Anti-infectives    None      Subjective:    Anna Henson she relates her pain and numbness in her neck is improved,  Objective:    Filed Vitals:   11/15/15 2159 11/16/15 0433 11/16/15 0646 11/16/15 0843  BP: 157/69 181/85 141/62 157/73  Pulse: 63 52 60 68  Temp: 98.3 F (36.8 C) 97.5 F (36.4 C)    TempSrc: Oral Oral    Resp: 18 16    Height:      Weight:      SpO2: 100% 99%      Intake/Output Summary (Last 24 hours) at 11/16/15 1005 Last data filed at 11/16/15 0916  Gross per 24 hour  Intake    774 ml  Output    500 ml  Net    274 ml   Filed Weights   11/11/15 2158 11/12/15 0505  Weight: 74.503 kg (164 lb 4 oz) 73.6 kg (162 lb 4.1 oz)    Exam: Gen:  NAD Cardiovascular:  RRR, No  M/R/G Chest and lungs:   CTAB Abdomen:  Abdomen soft, NT/ND, + BS Extremities:  No edema Neurological exam she: She is awake alert and oriented 3 .   Data Reviewed:    Labs: Basic Metabolic Panel:  Recent Labs Lab 11/11/15 2330 11/12/15 0351 11/13/15 0635 11/14/15 0609 11/15/15 0726  NA 140 140 136 137 139  K 3.3* 3.7 3.2* 3.8 4.2  CL 100* 103 102 102 104  CO2  --  25 22 22 25   GLUCOSE 99 107* 140* 178* 142*  BUN 13 12 11 17 16   CREATININE 1.20* 1.25* 1.13* 1.22* 1.02*  CALCIUM  --  9.3 8.8* 8.4* 8.5*  MG  --  2.1  --   --   --    GFR Estimated Creatinine Clearance: 65.2 mL/min (by C-G formula based on Cr of 1.02). Liver Function Tests:  Recent Labs Lab 11/12/15 0351  AST 30  ALT 28  ALKPHOS 51  BILITOT 0.5  PROT 6.2*  ALBUMIN 3.6   No results for input(s): LIPASE, AMYLASE in the last 168 hours. No results for input(s): AMMONIA in the last 168 hours. Coagulation profile  Recent Labs Lab 11/12/15 0350  INR 1.03    CBC:  Recent Labs Lab 11/11/15 2330 11/12/15 0351  WBC  --  7.0  HGB 12.2 12.5  HCT 36.0 36.0  MCV  --  91.4  PLT  --  331   Cardiac Enzymes: No results for input(s): CKTOTAL, CKMB, CKMBINDEX, TROPONINI in the last 168 hours. BNP (last 3 results) No results for input(s): PROBNP in the last 8760 hours. CBG:  Recent Labs Lab 11/13/15 0750 11/14/15 0811 11/15/15 0547 11/15/15 0746 11/16/15 0755  GLUCAP 169* 136* 149* 125* 166*   D-Dimer: No results for input(s): DDIMER in the last 72 hours. Hgb A1c: No results for input(s): HGBA1C in the last 72 hours. Lipid Profile: No results for input(s): CHOL, HDL, LDLCALC, TRIG, CHOLHDL, LDLDIRECT in the last 72 hours. Thyroid function studies: No results for input(s): TSH, T4TOTAL, T3FREE, THYROIDAB in the last 72 hours.  Invalid input(s): FREET3 Anemia work up: No results for input(s): VITAMINB12, FOLATE, FERRITIN, TIBC, IRON, RETICCTPCT in the last 72 hours. Sepsis  Labs:  Recent Labs Lab 11/12/15 0351  WBC 7.0   Microbiology No results found for this or any previous visit (from the past 240 hour(s)).   Medications:   . atorvastatin  80 mg Oral Daily  . methylPREDNISolone (SOLU-MEDROL) injection  500 mg Intravenous Q12H  . metoprolol  50 mg Oral BID  . polyethylene glycol  17 g Oral BID  . sodium chloride flush  3 mL Intravenous Q12H   Continuous Infusions:    Time spent: 15 min   LOS: 4 days   FELIZ  Marguarite Arbour  [facility] Pager [phone]  *Please refer to [email or web address], password [email or web address] to get updated schedule on who will round on this patient, as hospitalists switch teams weekly. If 7PM-7AM, please contact night-coverage at [email or web address], password [email or web address] for any overnight needs.  11/16/2015, 10:05 AM

## 2015-11-17 LAB — OLIGOCLONAL BANDS, CSF + SERM

## 2015-11-17 NOTE — Discharge Summary (Signed)
Physician Discharge Summary  [name] [id number] DOB: September 24, 1971 DOA: 11/11/2015  PCP: Leotis Pain, DO  Admit date: 11/11/2015 Discharge date: 11/17/2015  Time spent: > 30 minutes  Recommendations for Outpatient Follow-up:  1. Follow up with Dr. Felecia Shelling in neurology in 2 weeks   Discharge Diagnoses:  Principal Problem:   Numbness Active Problems:   Essential hypertension   HLD (hyperlipidemia)   CAD (coronary artery disease), native coronary artery   Acute-on-chronic kidney injury (Anna Henson)   Neck pain   Hypokalemia   Abnormal MRI, neck   Facial numbness   Right arm numbness  Discharge Condition: stable  Diet recommendation: heart healthy  Filed Weights   11/11/15 2158 11/12/15 0505  Weight: 74.503 kg (164 lb 4 oz) 73.6 kg (162 lb 4.1 oz)    History of present illness:  See H&P, Labs, Consult and Test reports for all details in brief, patient is a Anna Henson is an 45 y.o. female with past medical history of hypertension CAD status post stent, chronic in the 12s to represent with numbness on the right face / arm  Hospital Course:  Numbness on the right arm and face - Differential is broad MS transfer myelitis, unlikely stroke, also the Devic syndrome is a possibility. Patient underwent an LP which was fairly unremarkable. Neurology was consulted they recommended start IV Solu-Medrol pulse course, her last doses on 11/17/2015 in the morning. I have discussed with neurology on the day of discharge, no recommendations for steroid taper. Her symptoms have almost completely resolved with the steroids and on discharge she was back to baseline. Workup so far unrevealing with negative SSA, SSB, P- and C - ANCA, NMO - IgG, MPA, ACE, ANCA proteinase and negative ANA. Chronic kidney disease stage II - Creatinine at baseline. Essential hypertension  HLD (hyperlipidemia) Continue statins. Hypokalemia Resolved. CAD (coronary artery disease), native coronary artery      Procedures:  None    Consultations:  Neurology   Discharge Exam: Filed Vitals:   11/17/15 0026 11/17/15 0438 11/17/15 0614 11/17/15 0911  BP: 179/74 138/67 144/72 166/84  Pulse: 53 64 68 72  Temp:   97.8 F (36.6 C)   TempSrc:   Oral   Resp:   18   Height:      Weight:      SpO2:   97%     General: NAD Cardiovascular: RRR Respiratory: CTA biL Neuro: non focal    Discharge Instructions Activity:  As tolerated   Get Medicines reviewed and adjusted: Please take all your medications with you for your next visit with your Primary MD  Please request your Primary MD to go over all hospital tests and procedure/radiological results at the follow up, please ask your Primary MD to get all Hospital records sent to his/her office.  If you experience worsening of your admission symptoms, develop shortness of breath, life threatening emergency, suicidal or homicidal thoughts you must seek medical attention immediately by calling 911 or calling your MD immediately if symptoms less severe.  You must read complete instructions/literature along with all the possible adverse reactions/side effects for all the Medicines you take and that have been prescribed to you. Take any new Medicines after you have completely understood and accpet all the possible adverse reactions/side effects.   Do not drive when taking Pain medications.   Do not take more than prescribed Pain, Sleep and Anxiety Medications  Special Instructions: If you have smoked or chewed Tobacco in the last 2 yrs  please stop smoking, stop any regular Alcohol and or any Recreational drug use.  Wear Seat belts while driving.  Please note  You were cared for by a hospitalist during your hospital stay. Once you are discharged, your primary care physician will handle any further medical issues. Please note that NO REFILLS for any discharge medications will be authorized once you are discharged, as it is imperative that you  return to your primary care physician (or establish a relationship with a primary care physician if you do not have one) for your aftercare needs so that they can reassess your need for medications and monitor your lab values. Discharge Instructions    Ambulatory referral to Neurology    Complete by:  As directed   An appointment is requested in approximately: 2 weeks with Dr. Felecia Shelling, Richard            Medication List    TAKE these medications        amoxicillin 500 MG tablet  Commonly known as:  AMOXIL  Take 2 tablets (1,000 mg total) by mouth 2 (two) times daily.     aspirin 81 MG chewable tablet  Chew 1 tablet (81 mg total) by mouth daily.     atorvastatin 80 MG tablet  Commonly known as:  LIPITOR  Take 1 tablet (80 mg total) by mouth daily.     fluticasone 50 MCG/ACT nasal spray  Commonly known as:  FLONASE  Place 2 sprays into both nostrils daily.     metoprolol 50 MG tablet  Commonly known as:  LOPRESSOR  TAKE 1 TABLET BY MOUTH TWICE DAILY. NEED OFFICE VISIT FOR FURTHER REFILLS     triamterene-hydrochlorothiazide 37.5-25 MG capsule  Commonly known as:  DYAZIDE  TAKE 1 CAPSULE BY MOUTH EVERY DAY           Follow-up Information    Follow up with SATER,RICHARD A, MD. Schedule an appointment as soon as possible for a visit in 3 weeks.   Specialty:  Neurology   Why:  Appointment with Dr. Felecia Shelling is on 12/14/15 at 1pm for paperwork app at 1:20   Contact information:   [address] [place] [id number] [phone]       The results of significant diagnostics from this hospitalization (including imaging, microbiology, ancillary and laboratory) are listed below for reference.    Significant Diagnostic Studies: Ct Soft Tissue Neck W Contrast  11/11/2015  CLINICAL DATA:  Right facial numbness. Throat pain and drooling. The patient has been on antibiotics for 5 days without relief. EXAM: CT NECK WITH CONTRAST TECHNIQUE: Multidetector CT imaging of the neck was  performed using the standard protocol following the bolus administration of intravenous contrast. CONTRAST:  54mL OMNIPAQUE IOHEXOL 300 MG/ML  SOLN COMPARISON:  None. FINDINGS: Pharynx and larynx: With there is moderate prominence of the adenoid tissue on lingual tonsils bilaterally. The mild prominence the palatine tonsils. No discrete abscess is or fluid collection is present. The epiglottis is within normal limits. Salivary glands: The submandibular and parotid glands are within normal limits bilaterally. Thyroid: Within normal limits. Lymph nodes: Bilateral sub cm reactive type lymph nodes are present. Vascular: Atherosclerotic calcifications are present at the carotid bifurcations bilaterally without significant stenosis. Limited intracranial: Within normal limits Visualized orbits: Not imaged Mastoids and visualized paranasal sinuses: Clear Skeleton: Mild endplate degenerative change in uncovertebral spurring is present at C5-6 and C6-7. Osseous foraminal narrowing is worse on the right at C5-6 and on the left at C6-7. There is  reversal of the normal cervical lordosis. Upper chest: The lung apices are clear. IMPRESSION: 1. Mild prominence of the adenoid tissue and lingual tonsils without a discrete fluid collection or abscess. 2. Reactive type bilateral level 2 and level 3 lymph nodes. 3. The airway is patent.  No focal mass lesion is evident. Electronically Signed   By: San Morelle M.D.   On: 11/11/2015 11:58   Ct Chest Wo Contrast  11/12/2015  CLINICAL DATA:  Sarcoidosis EXAM: CT CHEST WITHOUT CONTRAST TECHNIQUE: Multidetector CT imaging of the chest was performed following the standard protocol without IV contrast. COMPARISON:  CTA chest dated 09/13/2014 FINDINGS: Mediastinum/Nodes: The heart is oval size.  No pericardial effusion. Age advanced coronary atherosclerosis in the right coronary artery (series 2/ image 28). No suspicious mediastinal lymphadenopathy. Visualized thyroid is unremarkable.  Lungs/Pleura: 4 mm subpleural nodule in the medial right upper lobe (series 3/image 20), unchanged from 2015, benign. Additional mild subpleural nodularity along the left fissure measuring up to 4 mm (series 3/ image 29-30), unchanged since 2015, benign. No findings specific for sarcoidosis. No focal consolidation. No pleural effusion or pneumothorax. Upper abdomen: Visualized abdomen is notable for excretory contrast (likely gadolinium) within the gallbladder. Musculoskeletal: Visualized osseous structures are within normal limits. IMPRESSION: No findings specific for sarcoidosis. Stable mild subpleural nodularity measuring up to 4 mm, unchanged since 2015, benign. No evidence of acute cardiopulmonary disease. Age advanced coronary atherosclerosis in the right coronary artery. Electronically Signed   By: Julian Hy M.D.   On: 11/12/2015 16:11   Mr Brain Wo Contrast  11/12/2015  CLINICAL DATA:  RIGHT facial and arm numbness for 1 week associated with neck pain. History of prominent lymph nodes in the neck, hypertension, hyperlipidemia. EXAM: MRI HEAD WITHOUT CONTRAST MRI CERVICAL SPINE WITHOUT AND WITH CONTRAST TECHNIQUE: Multiplanar, multiecho pulse sequences of the brain and surrounding structures were obtained without and with intravenous contrast. Multiplanar multi echo pulse sequences of the Cervical spine, to include the craniocervical junction and cervicothoracic junction, were obtained without and with intravenous contrast. CONTRAST:  64mL MULTIHANCE GADOBENATE DIMEGLUMINE 529 MG/ML IV SOLN COMPARISON:  None.  Judeen Hammans UPSTILL PA FINDINGS: MRI HEAD FINDINGS The ventricles and sulci are normal for patient's age. No abnormal parenchymal signal, mass lesions, mass effect. A few scattered subcentimeter supratentorial subcortical and deep white matter T2 hyperintensities. No reduced diffusion to suggest acute ischemia. No susceptibility artifact to suggest hemorrhage. No abnormal extra-axial fluid  collections. No extra-axial masses though, contrast enhanced sequences would be more sensitive. Normal major intracranial vascular flow voids seen at the skull base.Ocular globes and orbital contents are unremarkable though not tailored for evaluation. No abnormal sellar expansion. No suspicious calvarial bone marrow signal. Craniocervical junction maintained. Visualized paranasal sinuses and mastoid air cells are well-aerated. Prominent nasopharyngeal soft tissues can be seen with recent viral illness and, immunocompromised states. MRI CERVICAL SPINE FINDINGS Cervical vertebral bodies and posterior elements intact and aligned with straightened cervical lordosis. Moderate C6-7 disc height loss, mild at C5-6 with decreased T2 signal within the cervical disc compatible with mild desiccation. Mild chronic discogenic endplate changes 075-GRM and C6-7 without STIR signal abnormality to suggest acute osseous process. 7 mm enhancing nodule within RIGHT dorsal column of the spinal cord, at the craniocervical junction. Mildly expansile edema extend to level of C4-5 without syrinx. No surrounding susceptibility artifact to suggest hemorrhage, no reduced diffusion to suggest hypercellular tumor or acute infarct. No suspicious leptomeningeal or epidural enhancement. Included prevertebral and paraspinal soft tissues are nonacute;  suspected thyromegaly. Level by level evaluation: C2-3: No disc bulge, canal stenosis nor neural foraminal narrowing. C3-4: Small cyst LEFT central disc protrusion, mild uncovertebral hypertrophy. Mild canal stenosis without neural foraminal narrowing. C4-5: Small LEFT central to subarticular disc protrusion. Uncovertebral hypertrophy. Very mild canal stenosis. Mild LEFT neural foraminal narrowing. C5-6: 2 mm broad-based disc bulge, uncovertebral hypertrophy. Mild to moderate canal stenosis. Moderate RIGHT greater than LEFT neural foraminal narrowing. C6-7: 2 mm broad-based disc bulge with superimposed  moderate to large LEFT subarticular to extra foraminal disc protrusion. Uncovertebral hypertrophy. Mild canal stenosis. Severe LEFT neural foraminal narrowing. C7-T1: No disc bulge, canal stenosis nor neural foraminal narrowing. IMPRESSION: MRI HEAD: No acute intracranial process. Minimal white matter changes can be seen with chronic small vessel ischemic disease, atypical in distribution for demyelination. MRI CERVICAL SPINE: 7 mm enhancing lesion RIGHT dorsal column at proximal cervical spine, with fixed surrounding edema to the level C4-5. Differential diagnosis includes atypical demyelination, subacute infarct, less likely tumor such is ependymoma/astrocytoma. Recommend correlation with CSF sampling. Straightened cervical lordosis without acute fracture malalignment. Degenerative cervical spine. Mild to moderate canal stenosis at C5-6. Neural foraminal narrowing C4-5 through C6-7: Severe on the LEFT at C6-7. Acute findings discussed with and reconfirmed by Frederich Balding PA on 11/12/2015 at 2:20 am. Electronically Signed   By: Elon Alas M.D.   On: 11/12/2015 02:22   Mr Cervical Spine W Wo Contrast  11/12/2015  CLINICAL DATA:  RIGHT facial and arm numbness for 1 week associated with neck pain. History of prominent lymph nodes in the neck, hypertension, hyperlipidemia. EXAM: MRI HEAD WITHOUT CONTRAST MRI CERVICAL SPINE WITHOUT AND WITH CONTRAST TECHNIQUE: Multiplanar, multiecho pulse sequences of the brain and surrounding structures were obtained without and with intravenous contrast. Multiplanar multi echo pulse sequences of the Cervical spine, to include the craniocervical junction and cervicothoracic junction, were obtained without and with intravenous contrast. CONTRAST:  35mL MULTIHANCE GADOBENATE DIMEGLUMINE 529 MG/ML IV SOLN COMPARISON:  None.  Judeen Hammans UPSTILL PA FINDINGS: MRI HEAD FINDINGS The ventricles and sulci are normal for patient's age. No abnormal parenchymal signal, mass lesions, mass  effect. A few scattered subcentimeter supratentorial subcortical and deep white matter T2 hyperintensities. No reduced diffusion to suggest acute ischemia. No susceptibility artifact to suggest hemorrhage. No abnormal extra-axial fluid collections. No extra-axial masses though, contrast enhanced sequences would be more sensitive. Normal major intracranial vascular flow voids seen at the skull base.Ocular globes and orbital contents are unremarkable though not tailored for evaluation. No abnormal sellar expansion. No suspicious calvarial bone marrow signal. Craniocervical junction maintained. Visualized paranasal sinuses and mastoid air cells are well-aerated. Prominent nasopharyngeal soft tissues can be seen with recent viral illness and, immunocompromised states. MRI CERVICAL SPINE FINDINGS Cervical vertebral bodies and posterior elements intact and aligned with straightened cervical lordosis. Moderate C6-7 disc height loss, mild at C5-6 with decreased T2 signal within the cervical disc compatible with mild desiccation. Mild chronic discogenic endplate changes 075-GRM and C6-7 without STIR signal abnormality to suggest acute osseous process. 7 mm enhancing nodule within RIGHT dorsal column of the spinal cord, at the craniocervical junction. Mildly expansile edema extend to level of C4-5 without syrinx. No surrounding susceptibility artifact to suggest hemorrhage, no reduced diffusion to suggest hypercellular tumor or acute infarct. No suspicious leptomeningeal or epidural enhancement. Included prevertebral and paraspinal soft tissues are nonacute; suspected thyromegaly. Level by level evaluation: C2-3: No disc bulge, canal stenosis nor neural foraminal narrowing. C3-4: Small cyst LEFT central disc protrusion, mild uncovertebral hypertrophy.  Mild canal stenosis without neural foraminal narrowing. C4-5: Small LEFT central to subarticular disc protrusion. Uncovertebral hypertrophy. Very mild canal stenosis. Mild LEFT  neural foraminal narrowing. C5-6: 2 mm broad-based disc bulge, uncovertebral hypertrophy. Mild to moderate canal stenosis. Moderate RIGHT greater than LEFT neural foraminal narrowing. C6-7: 2 mm broad-based disc bulge with superimposed moderate to large LEFT subarticular to extra foraminal disc protrusion. Uncovertebral hypertrophy. Mild canal stenosis. Severe LEFT neural foraminal narrowing. C7-T1: No disc bulge, canal stenosis nor neural foraminal narrowing. IMPRESSION: MRI HEAD: No acute intracranial process. Minimal white matter changes can be seen with chronic small vessel ischemic disease, atypical in distribution for demyelination. MRI CERVICAL SPINE: 7 mm enhancing lesion RIGHT dorsal column at proximal cervical spine, with fixed surrounding edema to the level C4-5. Differential diagnosis includes atypical demyelination, subacute infarct, less likely tumor such is ependymoma/astrocytoma. Recommend correlation with CSF sampling. Straightened cervical lordosis without acute fracture malalignment. Degenerative cervical spine. Mild to moderate canal stenosis at C5-6. Neural foraminal narrowing C4-5 through C6-7: Severe on the LEFT at C6-7. Acute findings discussed with and reconfirmed by Frederich Balding PA on 11/12/2015 at 2:20 am. Electronically Signed   By: Elon Alas M.D.   On: 11/12/2015 02:22   Mr Thoracic Spine W Wo Contrast  11/13/2015  CLINICAL DATA:  45 year old female with enhancing and edematous upper cervical spinal cord lesion. Differential diagnosis of demyelination, inflammatory lesion, tumor (astrocytoma), and subacute infarct. Right facial and arm numbness with neck pain for 1 week. Subsequent encounter. EXAM: MRI THORACIC SPINE WITHOUT AND WITH CONTRAST TECHNIQUE: Multiplanar and multiecho pulse sequences of the thoracic spine were obtained without and with intravenous contrast. CONTRAST:  18mL MULTIHANCE GADOBENATE DIMEGLUMINE 529 MG/ML IV SOLN COMPARISON:  Cervical spine and brain MRI  without and with contrast 11/12/2015. FINDINGS: Cervical spine findings reported yesterday. Normal thoracic vertebral height and alignment. No marrow edema or evidence of acute osseous abnormality. Normal visible bone marrow signal. Negative visualized paraspinal soft tissues aside from mild subcutaneous edema in the visible upper lumbar spine. Thyromegaly, no discrete thyroid nodule. Negative visualized thoracic and upper abdominal viscera. The thoracic spinal cord is within normal limits at all visualized levels. From the cervicothoracic junction to the conus medullaris which is at L1 there is normal spinal cord signal and morphology. No abnormal intradural enhancement on this exam. Visible cauda equina nerve roots appear normal. Normal thoracic intervertebral discs. No thoracic degenerative changes identified. IMPRESSION: 1. Normal thoracic spine and thoracic spinal cord. 2. 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 Electronically Signed   By: Genevie Ann M.D.   On: 11/13/2015 17:38   Dg Fluoro Guide Lumbar Puncture  11/15/2015  CLINICAL DATA:  Right arm numbness.  Enhancing spinal cord lesion. EXAM: DIAGNOSTIC LUMBAR PUNCTURE UNDER FLUOROSCOPIC GUIDANCE FLUOROSCOPY TIME:  Radiation Exposure Index (as provided by the fluoroscopic device): If the device does not provide the exposure index: Fluoroscopy Time (in minutes and seconds):  0 minutes 48 seconds Number of Acquired Images: PROCEDURE: Informed consent was obtained from the patient prior to the procedure, including potential complications of headache, allergy, and pain. With the patient prone, the lower back was prepped with Betadine. 1% Lidocaine was used for local anesthesia. Lumbar puncture was performed at the L4-5 level using a 20 gauge needle with return of clear CSF with an opening pressure of 30 cm water. Nine ml of CSF were obtained for laboratory studies. The patient tolerated the procedure well and there were no apparent complications. IMPRESSION:  Successful lumbar puncture. Clear CSF  sent for laboratory studies has ordered. Electronically Signed   By: Franchot Gallo M.D.   On: 11/15/2015 14:44   Dg Fluoro Guide Lumbar Puncture  11/12/2015  CLINICAL DATA:  Possible myelitis. EXAM: DIAGNOSTIC LUMBAR PUNCTURE UNDER FLUOROSCOPIC GUIDANCE FLUOROSCOPY TIME:  Fluoroscopy Time (in minutes and seconds): 1 minutes and 36 seconds Number of Acquired Images:  None PROCEDURE: Informed consent was obtained from the patient prior to the procedure, including potential complications of headache, allergy, and pain. With the patient prone, the lower back was prepped with Betadine. 1% Lidocaine was used for local anesthesia. The L4-5 level was localized and numbed with 1% lidocaine. Upon insertion of the needle, the patient became uncomfortable and anxious. After 2 attempts at repositioning the needle, the patient refused further manipulation and demanded that the procedure was stopped. IMPRESSION: Aborted lumbar puncture, as detailed above, at patient request. Electronically Signed   By: Abigail Miyamoto M.D.   On: 11/12/2015 16:27    Microbiology: No results found for this or any previous visit (from the past 240 hour(s)).   Labs: Basic Metabolic Panel:  Recent Labs Lab 11/11/15 2330 11/12/15 0351 11/13/15 0635 11/14/15 0609 11/15/15 0726  NA 140 140 136 137 139  K 3.3* 3.7 3.2* 3.8 4.2  CL 100* 103 102 102 104  CO2  --  25 22 22 25   GLUCOSE 99 107* 140* 178* 142*  BUN 13 12 11 17 16   CREATININE 1.20* 1.25* 1.13* 1.22* 1.02*  CALCIUM  --  9.3 8.8* 8.4* 8.5*  MG  --  2.1  --   --   --    Liver Function Tests:  Recent Labs Lab 11/12/15 0351  AST 30  ALT 28  ALKPHOS 51  BILITOT 0.5  PROT 6.2*  ALBUMIN 3.6   CBC:  Recent Labs Lab 11/11/15 2330 11/12/15 0351  WBC  --  7.0  HGB 12.2 12.5  HCT 36.0 36.0  MCV  --  91.4  PLT  --  331   CBG:  Recent Labs Lab 11/13/15 0750 11/14/15 0811 11/15/15 0547 11/15/15 0746 11/16/15 0755    GLUCAP 169* 136* 149* 125* 166*    Signed:  Marzetta Board  [facility] 11/17/2015, 2:30 PM

## 2015-11-17 NOTE — Progress Notes (Signed)
Nsg Discharge Note  Admit Date:  11/11/2015 Discharge date: 11/17/2015   Maronda Syler to be D/C'd Home per MD order.  AVS completed.  Copy for chart, and copy for patient signed, and dated. Patient able to verbalize understanding.  Discharge Medication:   Medication List    TAKE these medications        amoxicillin 500 MG tablet  Commonly known as:  AMOXIL  Take 2 tablets (1,000 mg total) by mouth 2 (two) times daily.     aspirin 81 MG chewable tablet  Chew 1 tablet (81 mg total) by mouth daily.     atorvastatin 80 MG tablet  Commonly known as:  LIPITOR  Take 1 tablet (80 mg total) by mouth daily.     fluticasone 50 MCG/ACT nasal spray  Commonly known as:  FLONASE  Place 2 sprays into both nostrils daily.     metoprolol 50 MG tablet  Commonly known as:  LOPRESSOR  TAKE 1 TABLET BY MOUTH TWICE DAILY. NEED OFFICE VISIT FOR FURTHER REFILLS     triamterene-hydrochlorothiazide 37.5-25 MG capsule  Commonly known as:  DYAZIDE  TAKE 1 CAPSULE BY MOUTH EVERY DAY        Discharge Assessment: Filed Vitals:   11/17/15 0614 11/17/15 0911  BP: 144/72 166/84  Pulse: 68 72  Temp: 97.8 F (36.6 C)   Resp: 18    Skin clean, dry and intact without evidence of skin break down, no evidence of skin tears noted. IV catheter discontinued with catheter tip intact. Site without signs and symptoms of complications - no redness or edema noted at insertion site, patient denies c/o pain - only slight tenderness at site.  Dressing with slight pressure applied.  D/c Instructions-Education: Discharge instructions given to patient with verbalized understanding. D/c education completed with patient including follow up instructions, medication list, d/c activities limitations if indicated, with other d/c instructions as indicated by MD - patient able to verbalize understanding, all questions fully answered. Patient instructed to return to ED, call 911, or call MD for any changes in condition.   Volunteer services wheeled pt in wheelchair to the front entrance.   Dorita Fray, RN 11/17/2015 2:08 PM

## 2015-11-19 LAB — NEUROMYELITIS OPTICA AUTOAB, IGG: NMO-IgG: <1.5 U/mL (ref 0.0–3.0)

## 2015-11-24 ENCOUNTER — Telehealth: Payer: Self-pay

## 2015-11-24 ENCOUNTER — Ambulatory Visit (INDEPENDENT_AMBULATORY_CARE_PROVIDER_SITE_OTHER): Payer: Federal, State, Local not specified - PPO | Admitting: Family Medicine

## 2015-11-24 VITALS — BP 94/70 | HR 90 | Temp 98.6°F | Resp 16 | Ht 62.5 in | Wt 164.0 lb

## 2015-11-24 DIAGNOSIS — R21 Rash and other nonspecific skin eruption: Secondary | ICD-10-CM | POA: Diagnosis not present

## 2015-11-24 DIAGNOSIS — E049 Nontoxic goiter, unspecified: Secondary | ICD-10-CM

## 2015-11-24 DIAGNOSIS — D72829 Elevated white blood cell count, unspecified: Secondary | ICD-10-CM

## 2015-11-24 DIAGNOSIS — R5382 Chronic fatigue, unspecified: Secondary | ICD-10-CM | POA: Diagnosis not present

## 2015-11-24 DIAGNOSIS — Z09 Encounter for follow-up examination after completed treatment for conditions other than malignant neoplasm: Secondary | ICD-10-CM

## 2015-11-24 DIAGNOSIS — R591 Generalized enlarged lymph nodes: Secondary | ICD-10-CM

## 2015-11-24 DIAGNOSIS — R599 Enlarged lymph nodes, unspecified: Secondary | ICD-10-CM

## 2015-11-24 LAB — POCT CBC
Granulocyte percent: 73.2 %G (ref 37–80)
HCT, POC: 33.8 % — AB (ref 37.7–47.9)
Hemoglobin: 11.6 g/dL — AB (ref 12.2–16.2)
Lymph, poc: 3 (ref 0.6–3.4)
MCH, POC: 31.5 pg — AB (ref 27–31.2)
MCHC: 34.3 g/dL (ref 31.8–35.4)
MCV: 91.7 fL (ref 80–97)
MID (cbc): 0.4 (ref 0–0.9)
MPV: 6.5 fL (ref 0–99.8)
POC Granulocyte: 9.4 — AB (ref 2–6.9)
POC LYMPH PERCENT: 23.4 %L (ref 10–50)
POC MID %: 3.4 %M (ref 0–12)
Platelet Count, POC: 299 10*3/uL (ref 142–424)
RBC: 3.69 M/uL — AB (ref 4.04–5.48)
RDW, POC: 14.6 %
WBC: 12.9 10*3/uL — AB (ref 4.6–10.2)

## 2015-11-24 LAB — COMPLETE METABOLIC PANEL WITH GFR
ALT: 39 U/L — ABNORMAL HIGH (ref 6–29)
AST: 27 U/L (ref 10–30)
Albumin: 3.5 g/dL — ABNORMAL LOW (ref 3.6–5.1)
Alkaline Phosphatase: 51 U/L (ref 33–115)
BUN: 15 mg/dL (ref 7–25)
CO2: 25 mmol/L (ref 20–31)
Calcium: 8.9 mg/dL (ref 8.6–10.2)
Chloride: 104 mmol/L (ref 98–110)
Creat: 1.19 mg/dL — ABNORMAL HIGH (ref 0.50–1.10)
GFR, Est African American: 65 mL/min (ref >=60)
GFR, Est Non African American: 56 mL/min — ABNORMAL LOW (ref >=60)
Glucose, Bld: 118 mg/dL — ABNORMAL HIGH (ref 65–99)
Potassium: 4.1 mmol/L (ref 3.5–5.3)
Sodium: 139 mmol/L (ref 135–146)
Total Bilirubin: 0.3 mg/dL (ref 0.2–1.2)
Total Protein: 6.1 g/dL (ref 6.1–8.1)

## 2015-11-24 LAB — TSH: TSH: 0.83 mIU/L

## 2015-11-24 LAB — POCT GLYCOSYLATED HEMOGLOBIN (HGB A1C): Hemoglobin A1C: 6.2

## 2015-11-24 NOTE — Telephone Encounter (Signed)
Patient is letting us know that she faxed in fmla paperwork today for Dr. Marin Comment to fill out

## 2015-11-24 NOTE — Progress Notes (Signed)
Chief Complaint:  Chief Complaint  Patient presents with  . Follow-up    Hospital follow up, MRI follow up  . Thyroid Problem  . Rash    Chest/Back  . Adenopathy  . Headache  . Sore Throat    HPI: Anna Henson is a 45 y.o. female who reports to [facility] today complaining of hospital follow-up  She is still having Headache and right facial pain and numbness of right arm, she is right arm dominant. Better than when she was in  Hospital but still present. She was on high dose steroid methylprednisolone  500 mg IV q12 hrs until the day she as dc and then was not given a taper. She noticed her sxs were improved on streoids but then returned some, also sh has ahd rash on her face , acne and laso on her torso.  She cannot work when she has numbness and pain like this. She has neuro appt on 12/14/15  She wants to get her thyroid level checked since there was an enlarged thyroid on exam during hsoptialization  Please see DC summary below:   Recommendations for Outpatient Follow-up:  1. Follow up with Dr. Felecia Shelling in neurology in 2 weeks  Discharge Diagnoses:  Principal Problem:  Numbness Active Problems:  Essential hypertension  HLD (hyperlipidemia)  CAD (coronary artery disease), native coronary artery  Acute-on-chronic kidney injury ([place])  Neck pain  Hypokalemia  Abnormal MRI, neck  Facial numbness  Right arm numbness  Discharge Condition: stable  Diet recommendation: heart healthy  Filed Weights   11/11/15 2158 11/12/15 0505  Weight: 74.503 kg (164 lb 4 oz) 73.6 kg (162 lb 4.1 oz)    History of present illness:  See H&P, Labs, Consult and Test reports for all details in brief, patient is a Anna Henson is an 45 y.o. female with past medical history of hypertension CAD status post stent, chronic in the 81s to represent with numbness on the right face / arm  Hospital Course:  Numbness on the right arm and face - Differential is broad MS transfer  myelitis, unlikely stroke, also the Devic syndrome is a possibility. Patient underwent an LP which was fairly unremarkable. Neurology was consulted they recommended start IV Solu-Medrol pulse course, her last doses on 11/17/2015 in the morning. I have discussed with neurology on the day of discharge, no recommendations for steroid taper. Her symptoms have almost completely resolved with the steroids and on discharge she was back to baseline. Workup so far unrevealing with negative SSA, SSB, P- and C - ANCA, NMO - IgG, MPA, ACE, ANCA proteinase and negative ANA. Chronic kidney disease stage II - Creatinine at baseline. Essential hypertension  HLD (hyperlipidemia) Continue statins. Hypokalemia Resolved. CAD (coronary artery disease), native coronary artery    Procedures:  None  Consultations:  Neurology          Past Medical History  Diagnosis Date  . Hypertension     > 5 years  . Chronic kidney disease     she sthinks has stage 2 CKD, has had US doppler and has some stenosis , this was done in [place]   . Hyperlipidemia   . ACS (acute coronary syndrome) ([place]) 09/13/2014    Unstable anginal pain s/p 2.25 x 12 mm Promus Premier DES to the RCA  . CAD in native artery   . Tobacco abuse 09/13/2014  . CKD (chronic kidney disease), stage II    Past Surgical History  Procedure Laterality  Date  . Hernia repair    . Coronary angioplasty with stent placement      L main OK, LAD OK, CFX 30%, RCA 90%-0% w/ 2.25 x 12 mm Promus Premier DES, EF 55%  . Left heart catheterization with coronary angiogram N/A 09/14/2014    Procedure: LEFT HEART CATHETERIZATION WITH CORONARY ANGIOGRAM;  Surgeon: Burnell Blanks, MD;  Location: [facility] CATH LAB;  Service: Cardiovascular;  Laterality: N/A;  . Percutaneous coronary stent intervention (pci-s)  09/14/2014    Procedure: PERCUTANEOUS CORONARY STENT INTERVENTION (PCI-S);  Surgeon: Burnell Blanks, MD;  Location: [facility] CATH LAB;  Service:  Cardiovascular;;  . Tonsillectomy  2005    10 years ago   Social History   Social History  . Marital Status: Single    Spouse Name: N/A  . Number of Children: 0  . Years of Education: masters   Occupational History  .      [employer]   Social History Main Topics  . Smoking status: Former Smoker -- 0.50 packs/day    Types: Cigarettes  . Smokeless tobacco: Never Used     Comment: on patch since 09/09/14  . Alcohol Use: No  . Drug Use: No  . Sexual Activity: Not Asked   Other Topics Concern  . None   Social History Narrative   Consumes 2 cups of caffeine daily   Family History  Problem Relation Age of Onset  . Diabetes Mother   . Heart disease Mother   . Hyperlipidemia Mother   . Hypertension Mother   . Lung cancer Father    No Known Allergies Prior to Admission medications   Medication Sig Start Date End Date Taking? Authorizing Provider  atorvastatin (LIPITOR) 80 MG tablet Take 1 tablet (80 mg total) by mouth daily. 08/30/15  Yes Orma Flaming, MD  fluticasone [facility]) 50 MCG/ACT nasal spray Place 2 sprays into both nostrils daily. 11/07/15  Yes Wardell Honour, MD  metoprolol (LOPRESSOR) 50 MG tablet TAKE 1 TABLET BY MOUTH TWICE DAILY. NEED OFFICE VISIT FOR FURTHER REFILLS 08/30/15  Yes Orma Flaming, MD  triamterene-hydrochlorothiazide (DYAZIDE) 37.5-25 MG capsule TAKE 1 CAPSULE BY MOUTH EVERY DAY 08/30/15  Yes Orma Flaming, MD  amoxicillin (AMOXIL) 500 MG tablet Take 2 tablets (1,000 mg total) by mouth 2 (two) times daily. Patient not taking: Reported on 11/24/2015 11/07/15   Wardell Honour, MD  aspirin 81 MG chewable tablet Chew 1 tablet (81 mg total) by mouth daily. Patient not taking: Reported on 11/24/2015 09/16/14   Nita Sells, MD     ROS: The patient denies fevers, chills, night sweats, unintentional weight loss, chest pain, palpitations, wheezing, dyspnea on exertion, nausea, vomiting, abdominal pain, dysuria, hematuria, melena, All other systems  have been reviewed and were otherwise negative with the exception of those mentioned in the HPI and as above.    PHYSICAL EXAM: Filed Vitals:   11/24/15 0955  BP: 94/70  Pulse: 90  Temp: 98.6 F (37 C)  Resp: 16   SpO2 Readings from Last 3 Encounters:  11/24/15 98%  11/17/15 97%  11/07/15 99%    Body mass index is 29.5 kg/(m^2).   General: Alert, no acute distress HEENT:  Normocephalic, atraumatic, oropharynx patent. EOMI, PERRLA Cardiovascular:  Regular rate and rhythm, no rubs murmurs or gallops.  No Carotid bruits, radial pulse intact. No pedal edema.  Respiratory: Clear to auscultation bilaterally.  No wheezes, rales, or rhonchi.  No cyanosis, no use of accessory musculature  Abdominal: No organomegaly, abdomen is soft and non-tender, positive bowel sounds. No masses. Skin: + acne rashes. Neurologic: Facial musculature symmetric. Psychiatric: Patient acts appropriately throughout our interaction. Lymphatic: No cervical or submandibular lymphadenopathy Musculoskeletal: Gait intact. No edema, tenderness, 5/5 UE strength and LE     LABS: Results for orders placed or performed in visit on 11/24/15  POCT CBC  Result Value Ref Range   WBC 12.9 (A) 4.6 - 10.2 K/uL   Lymph, poc 3.0 0.6 - 3.4   POC LYMPH PERCENT 23.4 10 - 50 %L   MID (cbc) 0.4 0 - 0.9   POC MID % 3.4 0 - 12 %M   POC Granulocyte 9.4 (A) 2 - 6.9   Granulocyte percent 73.2 37 - 80 %G   RBC 3.69 (A) 4.04 - 5.48 M/uL   Hemoglobin 11.6 (A) 12.2 - 16.2 g/dL   HCT, POC 33.8 (A) 37.7 - 47.9 %   MCV 91.7 80 - 97 fL   MCH, POC 31.5 (A) 27 - 31.2 pg   MCHC 34.3 31.8 - 35.4 g/dL   RDW, POC 14.6 %   Platelet Count, POC 299 142 - 424 K/uL   MPV 6.5 0 - 99.8 fL  POCT glycosylated hemoglobin (Hb A1C)  Result Value Ref Range   Hemoglobin A1C 6.2      EKG/XRAY:   Primary read interpreted by Dr. Marin Comment at [facility].   ASSESSMENT/PLAN: Encounter Diagnoses  Name Primary?  [facility] Hospital discharge follow-up Yes  . Rash and  nonspecific skin eruption   . Enlarged thyroid   . Chronic fatigue   . Adenopathy    Leukocytoiss likely steroid induced, Also acne steroid induced Labs pending make sure no adrenal crisis and creatinine still baseline  She is not feeling back to baseline She has neuro appt on 3//7 2017, pending labs will try to get her to see neuro sooner IF FMLA paperworks needs to be filled out, then I can complete some Rx otc antihstaimine for urticaria Fu prn   Gross sideeffects, risk and benefits, and alternatives of medications d/w patient. Patient is aware that all medications have potential sideeffects and we are unable to predict every sideeffect or drug-drug interaction that may occur.  Thao Le DO  11/24/2015 11:26 AM

## 2015-11-25 ENCOUNTER — Telehealth: Payer: Self-pay

## 2015-11-25 LAB — EPSTEIN-BARR VIRUS VCA ANTIBODY PANEL
EBV EA IgG: <5 U/mL (ref <9.0)
EBV NA IgG: 534 U/mL — ABNORMAL HIGH (ref <18.0)
EBV VCA IgG: >750 U/mL — ABNORMAL HIGH (ref <18.0)
EBV VCA IgM: <10 U/mL (ref <36.0)

## 2015-11-25 NOTE — Telephone Encounter (Signed)
Patient faxed in FMLA forms to be completed by Dr Marin Comment, I have filled out what I can from the [place] notes and highlighted the rest, please fill this out and return to the FMLA/Disability box at the 102 checkout desk with in 5-7 business days. Thank you! I will place them in your box on 11/25/15.

## 2015-11-26 ENCOUNTER — Telehealth: Payer: Self-pay | Admitting: Family Medicine

## 2015-11-26 NOTE — Telephone Encounter (Signed)
Spoke with patient about labs, fill out FMLA

## 2015-11-29 NOTE — Telephone Encounter (Signed)
Paperwork completed, scanned and faxed on 11/29/15

## 2015-12-14 ENCOUNTER — Encounter: Payer: Self-pay | Admitting: Neurology

## 2015-12-14 ENCOUNTER — Ambulatory Visit (INDEPENDENT_AMBULATORY_CARE_PROVIDER_SITE_OTHER): Payer: Federal, State, Local not specified - PPO | Admitting: Neurology

## 2015-12-14 VITALS — BP 120/60 | HR 64 | Resp 14 | Ht 62.5 in | Wt 164.0 lb

## 2015-12-14 DIAGNOSIS — R202 Paresthesia of skin: Secondary | ICD-10-CM | POA: Diagnosis not present

## 2015-12-14 DIAGNOSIS — G373 Acute transverse myelitis in demyelinating disease of central nervous system: Secondary | ICD-10-CM | POA: Diagnosis not present

## 2015-12-14 DIAGNOSIS — M542 Cervicalgia: Secondary | ICD-10-CM | POA: Diagnosis not present

## 2015-12-14 DIAGNOSIS — R200 Anesthesia of skin: Secondary | ICD-10-CM

## 2015-12-14 DIAGNOSIS — I2510 Atherosclerotic heart disease of native coronary artery without angina pectoris: Secondary | ICD-10-CM | POA: Diagnosis not present

## 2015-12-14 NOTE — Progress Notes (Signed)
[facility]  PATIENT: Anna Henson DOB: January 01, 1971  REFERRING DOCTOR OR PCP:  Rikki Spearing SOURCE: Patient, outpatient and inpatient records in EMR, laboratory and imaging reports in EMR, MRI images on PACS.  _________________________________   HISTORICAL  CHIEF COMPLAINT:  Chief Complaint  Patient presents with  . Numbness    Anna Henson is here for eval of numbness right side of head downr right side of neck, right arm.  Onsest in January.  She was seen at [facility]--dx. with sinusitis.  Sx. no better with Amoxicillin.  She was then admitted to [facility] for 5 days--numbness resolved with IV steroids.  MRI brain/c-spine done 11-12-15/fim    HISTORY OF PRESENT ILLNESS:  I had the pleasure seeing you patient, Anna Henson, at [facility] for neurologic consultation regarding her recent transverse myelitis.  In late January,  she had a right sided headache and went to bed.  When she woke up, she noted numbness in the right neck, shoulder and arm.   Numbness worsened over the next fe days.   She went to her PCP an was told she might have sinusitis and Amoxicillin was prescribed.   After a couple more days, she saw another doctor and had a neck soft tissue CT scan.   That CT scan showed that she had an increase of adenoid tissue and reactive type lymph nodes.  By that time, numbness extended to the fingertips on the riht but not into her leg.   She was told to go to the emergency room for further evaluation. In the emergency room she had an MRI of the cervical spine and the brain. The MRI of the cervical spine showed a focus that extended from the lower medulla on the right down to C5 posteriorly and more medially within the spinal cord. She also had some enhancement around the level of the cervical medullary junction. She was admitted to the hospital for further treatment and testing.  She was treated with 5 days of IV Solu-Medrol. During that time, her symptoms of  numbness improved.  She has not returned to baseline.    She had many tests performed including lumbar puncture for CSF analysis and a thoracic spine MRI and serology blood tests. For the most part, the evaluation was negative.  Currently, she reports that the numbness has improved compared to last month. However, she continues to have a dysesthetic sensation in the right neck, right upper chest and right arm. When water from a shower hits her shoulder she does not get her typical sensation. At times the altered sensation has a prickly character and is a little uncomfortable but not tremendously painful. She has not noted any weakness.   Although she does not have numbness in the legs legs on a constant basis she gets occasionally mild sensations in the right leg that are different. Additionally she feels a little off balance when she is standing and walking. Her right arm is clumsy and her handwriting is much worse.   She denies any significant change in bladder function.   She has a [place] sign and flexing also increases the numbness.    She feels achy in general.  Her past medical history is remarkable for coronary artery disease diagnosed a few years ago that was treated with stenting.   She is on aspirin for that. She has hypertension and high cholesterol. These are being treated medically.  She denies any history of significant rash. She does not have joint swelling.  She does note quite a bit more fatigue over the past year. No mucosal sores.   She denies Raynaud's syndrome symptoms. She does not have a history of lupus or sarcoid.     I personally reviewed MRIs of the brain cervical and thoracic spine that were performed last month. 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 The enhancement does not extend up to the medulla.  Cervical spine also showed mild spinal stenosis at C3-C4 and mild to moderate spinal stenosis at C5-C6 and mild spinal stenosis at  C6-C7. There is some foraminal narrowing at those levels but no definite nerve root compression.  Elsewhere the brain, there were some subcortical and deep white matter T2/FLAIR hyperintense foci but not in a pattern that would be typical for MS. The thoracic spine was normal.  I also reviewed her many laboratory tests performed while she was at [employer]. Neuromyelitis optica antibodies are negative. She did not have oligoclonal bands. ANCA, ANA, SSA/SSB, ACE and Vit B-12 were all normal or negative.  She had Epstein-Barr IgG but not IgM. CSF cell counts did not show increase in white blood cells.  REVIEW OF SYSTEMS: Constitutional: No fevers, chills, sweats, or change in appetite.  She notes some fatigue Eyes: No visual changes, double vision, eye pain Ear, nose and throat: No hearing loss, ear pain, nasal congestion, sore throat Cardiovascular: No chest pain, palpitations.   H/O angina, CAD with stent Respiratory: No shortness of breath at rest or with exertion.   No wheezes GastrointestinaI: No nausea, vomiting, diarrhea, abdominal pain, fecal incontinence Genitourinary: No dysuria, urinary retention or frequency.  No nocturia. Musculoskeletal: No neck pain, back pain Integumentary: No rash, pruritus, skin lesions Neurological: as above Psychiatric: No depression at this time.  No anxiety Endocrine: No palpitations, diaphoresis, change in appetite, change in weigh or increased thirst Hematologic/Lymphatic: No anemia, purpura, petechiae. Allergic/Immunologic: No itchy/runny eyes, nasal congestion, recent allergic reactions, rashes  ALLERGIES: No Known Allergies  HOME MEDICATIONS:  Current outpatient prescriptions:  .  amoxicillin (AMOXIL) 500 MG tablet, Take 2 tablets (1,000 mg total) by mouth 2 (two) times daily., Disp: 40 tablet, Rfl: 0 .  aspirin 81 MG chewable tablet, Chew 1 tablet (81 mg total) by mouth daily., Disp: , Rfl:  .  atorvastatin (LIPITOR) 80 MG tablet, Take 1  tablet (80 mg total) by mouth daily., Disp: 90 tablet, Rfl: 1 .  fluticasone (FLONASE) 50 MCG/ACT nasal spray, Place 2 sprays into both nostrils daily., Disp: 16 g, Rfl: 12 .  metoprolol (LOPRESSOR) 50 MG tablet, TAKE 1 TABLET BY MOUTH TWICE DAILY. NEED OFFICE VISIT FOR FURTHER REFILLS, Disp: 60 tablet, Rfl: 4 .  triamterene-hydrochlorothiazide (DYAZIDE) 37.5-25 MG capsule, TAKE 1 CAPSULE BY MOUTH EVERY DAY, Disp: 30 capsule, Rfl: 4  PAST MEDICAL HISTORY: Past Medical History  Diagnosis Date  . Hypertension     > 5 years  . Chronic kidney disease     she sthinks has stage 2 CKD, has had US doppler and has some stenosis , this was done in [place]   . Hyperlipidemia   . ACS (acute coronary syndrome) ([place]) 09/13/2014    Unstable anginal pain s/p 2.25 x 12 mm Promus Premier DES to the RCA  . CAD in native artery   . Tobacco abuse 09/13/2014  . CKD (chronic kidney disease), stage II     PAST SURGICAL HISTORY: Past Surgical History  Procedure Laterality Date  . Hernia repair    .  Coronary angioplasty with stent placement      L main OK, LAD OK, CFX 30%, RCA 90%-0% w/ 2.25 x 12 mm Promus Premier DES, EF 55%  . Left heart catheterization with coronary angiogram N/A 09/14/2014    Procedure: LEFT HEART CATHETERIZATION WITH CORONARY ANGIOGRAM;  Surgeon: Burnell Blanks, MD;  Location[facility] CATH LAB;  Service: Cardiovascular;  Laterality: N/A;  . Percutaneous coronary stent intervention (pci-s)  09/14/2014    Procedure: PERCUTANEOUS CORONARY STENT INTERVENTION (PCI-S);  Surgeon: Burnell Blanks, MD;  Location: [facility] CATH LAB;  Service: Cardiovascular;;  . Tonsillectomy  2005    10 years ago    FAMILY HISTORY: Family History  Problem Relation Age of Onset  . Diabetes Mother   . Heart disease Mother   . Hyperlipidemia Mother   . Hypertension Mother   . Lung cancer Father     SOCIAL HISTORY:  Social History   Social History  . Marital Status: Single    Spouse Name: N/A  .  Number of Children: 0  . Years of Education: masters   Occupational History  .      [employer]   Social History Main Topics  . Smoking status: Former Smoker -- 0.50 packs/day    Types: Cigarettes  . Smokeless tobacco: Never Used     Comment: on patch since 09/09/14  . Alcohol Use: No  . Drug Use: No  . Sexual Activity: Not on file   Other Topics Concern  . Not on file   Social History Narrative   Consumes 2 cups of caffeine daily     PHYSICAL EXAM  Filed Vitals:   12/14/15 1319  BP: 120/60  Pulse: 64  Resp: 14  Height: 5' 2.5" (1.588 m)  Weight: 164 lb (74.39 kg)    Body mass index is 29.5 kg/(m^2).   General: The patient is well-developed and well-nourished and in no acute distress  Eyes:  Funduscopic exam shows normal optic discs and retinal vessels.  Neck: The neck is supple, no carotid bruits are noted.  The neck is nontender.  Cardiovascular: The heart has a regular rate and rhythm with a normal S1 and S2. There were no murmurs, gallops or rubs. Lungs are clear to auscultation.  Skin: Extremities are without significant edema.  Musculoskeletal:  Back is nontender  Neurologic Exam  Mental status: The patient is alert and oriented x 3 at the time of the examination. The patient has apparent normal recent and remote memory, with an apparently normal attention span and concentration ability.   Speech is normal.  Cranial nerves: Extraocular movements are full. Pupils are equal, round, and reactive to light and accomodation.  Visual fields are full.  Facial symmetry is present. There is good facial sensation to soft touch bilaterally.Facial strength is normal.  Trapezius and sternocleidomastoid strength is normal. No dysarthria is noted.  The tongue is midline, and the patient has symmetric elevation of the soft palate. No obvious hearing deficits are noted.  Motor:  Muscle bulk is normal.   Tone is normal. Strength is  5 / 5 in all 4 extremities.   Sensory:  She repots slight altered sensation in the right chest, upper back/neck and right arm.   Position sen was symmetric.  Vibratory sensation is mildly reduced over right arm.   Sensory testing is intact to pinprick, soft touch and vibration sensation in her legs.  Coordination: Cerebellar testing reveals good finger-nose-finger and heel-to-shin bilaterally.  Gait and station: Station  is normal.   Gait is normal. Tandem gait is slightly wide. Romberg is negative.   Reflexes: Deep tendon reflexes are symmetric and normal bilaterally.   Plantar responses are flexor.    DIAGNOSTIC DATA (LABS, IMAGING, TESTING) - I reviewed patient records, labs, notes, testing and imaging myself where available.  Lab Results  Component Value Date   WBC 12.9* 11/24/2015   HGB 11.6* 11/24/2015   HCT 33.8* 11/24/2015   MCV 91.7 11/24/2015   PLT 331 11/12/2015      Component Value Date/Time   NA 139 11/24/2015 1113   K 4.1 11/24/2015 1113   CL 104 11/24/2015 1113   CO2 25 11/24/2015 1113   GLUCOSE 118* 11/24/2015 1113   BUN 15 11/24/2015 1113   CREATININE 1.19* 11/24/2015 1113   CREATININE 1.02* 11/15/2015 0726   CALCIUM 8.9 11/24/2015 1113   PROT 6.1 11/24/2015 1113   ALBUMIN 3.5* 11/24/2015 1113   AST 27 11/24/2015 1113   ALT 39* 11/24/2015 1113   ALKPHOS 51 11/24/2015 1113   BILITOT 0.3 11/24/2015 1113   GFRNONAA 56* 11/24/2015 1113   GFRNONAA >60 11/15/2015 0726   GFRAA 65 11/24/2015 1113   GFRAA >60 11/15/2015 0726   Lab Results  Component Value Date   CHOL 128 10/25/2015   HDL 33* 10/25/2015   LDLCALC 76 10/25/2015   TRIG 97 10/25/2015   CHOLHDL 3.9 10/25/2015   Lab Results  Component Value Date   HGBA1C 6.2 11/24/2015   Lab Results  Component Value Date   [id number] 11/12/2015   Lab Results  Component Value Date   TSH 0.83 11/24/2015       ASSESSMENT AND PLAN  Transverse myelitis ([place]) - Plan: Cardiolipin antibodies, IgG, IgM, IgA, Lupus anticoagulant  Right arm  numbness  Neck pain  CAD in native artery    In summary, Anna Henson is a 45 year old woman who had the onset of a longitudinal transverse myelitis towards the end of January about 5 or 6 weeks ago. After receiving some steroids and with the passage of time, she has improved but continues to have mild sensory deficits and mild gait imbalance. Hopefully these symptoms will also improve. The transverse myelitis that also included the cervical junction and the right posterior medulla has an appearance concerning for Neuromyelitis optic (Devic's), vasculitis or other inflammatory myelitis. The appearance would be atypical for classic MS. The AQ4 autoantibodies (4 Devic's) were negative. However, 30% of the time Devic's disease will be seronegative so this remains only in the differential diagnosis. Angiotensin converting enzyme was normal and CT scan did not show severe lymphadenopathy making sarcoid unlikely. ANA and ANCA were negative. Anti-phospholipid syndrome is still possible, and possibility is further increased with her history of coronary artery disease, and we need to do blood work for these antibodies and lupus anticoagulant.     The antiphospholipid antibodies are negative, I will recheck her MRI in a couple months to look for resolution and to rule out additional lesions that might help determine the etiology. At this time, she does not meet the criteria for seronegative neuromyelitis optica as she has had only one attack.   I went over symptoms with her and have urged her to call me within a day if she has symptoms worrisome for transverse myelitis or optic neuritis or other significant neurologic symptom.   If she ends up having antiphospholipid syndrome or if she meets criteria for neuromyelitis optica in the future, we will refer her appropriately or  begin therapy. Rituxan would be my first choice for seronegative neuromyelitis optica if she turns out to have this disorder.   Although she  has dysesthesia, it is not bothering her enough to recommend treatment at this time.  She will return to see me in 6 weeks or sooner if there are new or worsening neurologic symptoms.     Anna Henson A. Felecia Shelling, MD, PhD [id number], [id number] PM Certified in Neurology, Clinical Neurophysiology, Sleep Medicine, Pain Medicine and Neuroimaging  [facility] [address] [place], [place] [address] [phone]

## 2015-12-16 LAB — LUPUS ANTICOAGULANT
Dilute Viper Venom Time: 32.7 s (ref 0.0–44.0)
PTT Lupus Anticoagulant: 28.1 s (ref 0.0–43.6)
Thrombin Time: 16.3 s (ref 0.0–20.9)
dPT Confirm Ratio: 0.96 Ratio (ref 0.00–1.40)
dPT: 34.6 s (ref 0.0–55.0)

## 2015-12-16 LAB — CARDIOLIPIN ANTIBODIES, IGG, IGM, IGA
Anticardiolipin IgA: <9 APL U/mL (ref 0–11)
Anticardiolipin IgG: <9 GPL U/mL (ref 0–14)
Anticardiolipin IgM: <9 MPL U/mL (ref 0–12)

## 2015-12-20 ENCOUNTER — Telehealth: Payer: Self-pay | Admitting: Neurology

## 2015-12-20 NOTE — Telephone Encounter (Signed)
Patient called to advise she has been having the worst pain in neck, can barely sleep on it, radiating in arm, doesn't want to keep taking Ibufprofen, is there something else that Dr. Felecia Shelling can recommend.

## 2015-12-20 NOTE — Telephone Encounter (Signed)
I have spoken with [facility] and given appt. with RAS tomorrow am/fim

## 2015-12-21 ENCOUNTER — Encounter: Payer: Self-pay | Admitting: Neurology

## 2015-12-21 ENCOUNTER — Ambulatory Visit (INDEPENDENT_AMBULATORY_CARE_PROVIDER_SITE_OTHER): Payer: Federal, State, Local not specified - PPO | Admitting: Neurology

## 2015-12-21 VITALS — BP 152/92 | HR 66 | Resp 14 | Ht 62.5 in | Wt 162.0 lb

## 2015-12-21 DIAGNOSIS — R200 Anesthesia of skin: Secondary | ICD-10-CM

## 2015-12-21 DIAGNOSIS — R202 Paresthesia of skin: Secondary | ICD-10-CM | POA: Diagnosis not present

## 2015-12-21 DIAGNOSIS — G373 Acute transverse myelitis in demyelinating disease of central nervous system: Secondary | ICD-10-CM | POA: Diagnosis not present

## 2015-12-21 MED ORDER — TRAMADOL HCL 50 MG PO TABS: 50.0000 mg | ORAL_TABLET | Freq: Four times a day (QID) | ORAL | Status: DC | PRN

## 2015-12-21 MED ORDER — GABAPENTIN 300 MG PO CAPS: 300.0000 mg | ORAL_CAPSULE | Freq: Four times a day (QID) | ORAL | Status: DC

## 2015-12-21 NOTE — Progress Notes (Signed)
[facility]  PATIENT: Anna Henson DOB: Oct 23, 1970  REFERRING DOCTOR OR PCP:  Rikki Spearing SOURCE: Patient, outpatient and inpatient records in EMR, laboratory and imaging reports in EMR, MRI images on PACS.  _________________________________   HISTORICAL  CHIEF COMPLAINT:  Chief Complaint  Patient presents with  . Transverse Myelitis    Sts. neck pain radiating into right shoulder, and mid back pain has been significantly worse over the last 4-5 days.[place]  . Neck Pain    HISTORY OF PRESENT ILLNESS:  Anna Henson is a 45 yo woman with a transverse myelitis.    She is experiencing more pain in her back between the shoulder blades.   She feels the numbness has changed to a painful tingling in that region.   Outside in the cold air, she feels a more burning sensation.. Water from a shower is also painful in that region.   Tylenol has slightly helped the pain  She denies any new weakness or change in balance.   She feels her gait is slower and she tires out more rapidly.    Her right arm is clumsy and her handwriting is much worse.   She denies any significant change in bladder function.   She has a Lhermite sign and flexing also increases the numbness.    She is sleeping worse since the pain started.  At her last visit, I checked work and the lupus anticoagulant and antiphospholipid tests were normal.  Transverse myelitis history:    In late January,  she had a right sided headache and went to bed.  When she woke up, she noted numbness in the right neck, shoulder and arm.   Numbness worsened over the next fe days.   She went to her PCP an was told she might have sinusitis and Amoxicillin was prescribed.   After a couple more days, she saw another doctor and had a neck soft tissue CT scan.   That CT scan showed that she had an increase of adenoid tissue and reactive type lymph nodes.  By that time, numbness extended to the fingertips on the riht but not into her leg.   She  was told to go to the emergency room for further evaluation. In the emergency room she had an MRI of the cervical spine and the brain. The MRI of the cervical spine showed a focus that extended from the lower medulla on the right down to C5 posteriorly and more medially within the spinal cord. She also had some enhancement around the level of the cervical medullary junction. She was admitted to the hospital for further treatment and testing.    She was treated with 5 days of IV Solu-Medrol. During that time, her symptoms of numbness improved.  She has not returned to baseline.    She had many tests performed including lumbar puncture for CSF analysis and a thoracic spine MRI and serology blood tests. For the most part, the evaluation was negative.  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 The enhancement does not extend up to the medulla.  Cervical spine also showed mild spinal stenosis at C3-C4 and mild to moderate spinal stenosis at C5-C6 and mild spinal stenosis at C6-C7. There is some foraminal narrowing at those levels but no definite nerve root compression.  Elsewhere the brain, there were some subcortical and deep white matter T2/FLAIR hyperintense foci but not in a pattern that would  be typical for MS. The thoracic spine was normal.    I also reviewed her many laboratory tests performed while she was at [employer]. Neuromyelitis optica antibodies are negative. She did not have oligoclonal bands. ANCA, ANA, SSA/SSB, ACE and Vit B-12 were all normal or negative.  She had Epstein-Barr IgG but not IgM. CSF cell counts did not show increase in white blood cells.  REVIEW OF SYSTEMS: Constitutional: No fevers, chills, sweats, or change in appetite.  She notes some fatigue Eyes: No visual changes, double vision, eye pain Ear, nose and throat: No hearing loss, ear pain, nasal congestion, sore throat Cardiovascular: No chest pain, palpitations.   H/O  angina, CAD with stent Respiratory: No shortness of breath at rest or with exertion.   No wheezes GastrointestinaI: No nausea, vomiting, diarrhea, abdominal pain, fecal incontinence Genitourinary: No dysuria, urinary retention or frequency.  No nocturia. Musculoskeletal: No neck pain, back pain Integumentary: No rash, pruritus, skin lesions Neurological: as above Psychiatric: No depression at this time.  No anxiety Endocrine: No palpitations, diaphoresis, change in appetite, change in weigh or increased thirst Hematologic/Lymphatic: No anemia, purpura, petechiae. Allergic/Immunologic: No itchy/runny eyes, nasal congestion, recent allergic reactions, rashes  ALLERGIES: No Known Allergies  HOME MEDICATIONS:  Current outpatient prescriptions:  .  aspirin 81 MG chewable tablet, Chew 1 tablet (81 mg total) by mouth daily., Disp: , Rfl:  .  atorvastatin (LIPITOR) 80 MG tablet, Take 1 tablet (80 mg total) by mouth daily., Disp: 90 tablet, Rfl: 1 .  fluticasone (FLONASE) 50 MCG/ACT nasal spray, Place 2 sprays into both nostrils daily., Disp: 16 g, Rfl: 12 .  metoprolol (LOPRESSOR) 50 MG tablet, TAKE 1 TABLET BY MOUTH TWICE DAILY. NEED OFFICE VISIT FOR FURTHER REFILLS, Disp: 60 tablet, Rfl: 4 .  tinidazole (TINDAMAX) 500 MG tablet, Take 1,000 mg by mouth daily., Disp: , Rfl: 0 .  triamterene-hydrochlorothiazide (DYAZIDE) 37.5-25 MG capsule, TAKE 1 CAPSULE BY MOUTH EVERY DAY, Disp: 30 capsule, Rfl: 4  PAST MEDICAL HISTORY: Past Medical History  Diagnosis Date  . Hypertension     > 5 years  . Chronic kidney disease     she sthinks has stage 2 CKD, has had US doppler and has some stenosis , this was done in [place]   . Hyperlipidemia   . ACS (acute coronary syndrome) ([place]) 09/13/2014    Unstable anginal pain s/p 2.25 x 12 mm Promus Premier DES to the RCA  . CAD in native artery   . Tobacco abuse 09/13/2014  . CKD (chronic kidney disease), stage II     PAST SURGICAL HISTORY: Past  Surgical History  Procedure Laterality Date  . Hernia repair    . Coronary angioplasty with stent placement      L main OK, LAD OK, CFX 30%, RCA 90%-0% w/ 2.25 x 12 mm Promus Premier DES, EF 55%  . Left heart catheterization with coronary angiogram N/A 09/14/2014    Procedure: LEFT HEART CATHETERIZATION WITH CORONARY ANGIOGRAM;  Surgeon: Burnell Blanks, MD;  Location: [facility] CATH LAB;  Service: Cardiovascular;  Laterality: N/A;  . Percutaneous coronary stent intervention (pci-s)  09/14/2014    Procedure: PERCUTANEOUS CORONARY STENT INTERVENTION (PCI-S);  Surgeon: Burnell Blanks, MD;  Location: [facility] CATH LAB;  Service: Cardiovascular;;  . Tonsillectomy  2005    10 years ago    FAMILY HISTORY: Family History  Problem Relation Age of Onset  . Diabetes Mother   . Heart disease Mother   . Hyperlipidemia Mother   .  Hypertension Mother   . Lung cancer Father     SOCIAL HISTORY:  Social History   Social History  . Marital Status: Single    Spouse Name: N/A  . Number of Children: 0  . Years of Education: masters   Occupational History  .      [employer]   Social History Main Topics  . Smoking status: Former Smoker -- 0.50 packs/day    Types: Cigarettes  . Smokeless tobacco: Never Used     Comment: on patch since 09/09/14  . Alcohol Use: No  . Drug Use: No  . Sexual Activity: Not on file   Other Topics Concern  . Not on file   Social History Narrative   Consumes 2 cups of caffeine daily     PHYSICAL EXAM  Filed Vitals:   12/21/15 0918  BP: 152/92  Pulse: 66  Resp: 14  Height: 5' 2.5" (1.588 m)  Weight: 162 lb (73.483 kg)    Body mass index is 29.14 kg/(m^2).   General: The patient is well-developed and well-nourished and in no acute distress  Neurologic Exam  Mental status: The patient is alert and oriented x 3 at the time of the examination. The patient has apparent normal recent and remote memory, with an apparently normal attention span and  concentration ability.   Speech is normal.  Cranial nerves: Extraocular movements are full.  There is good facial sensation to soft touch bilaterally.Facial strength is normal.  Trapezius and sternocleidomastoid strength is normal. No dysarthria is noted.  The tongue is midline, and the patient has symmetric elevation of the soft palate. No obvious hearing deficits are noted.  Motor:  Muscle bulk is normal.   Tone is normal. Strength is  5 / 5 in all 4 extremities.   Sensory: She reports hyperpathia over the right chest, neck and upper back.  The right arm feels numb to touch.  .   Sensory testing is intact to pinprick, soft touch and vibration sensation in her legs.  Coordination: Cerebellar testing reveals good finger-nose-finger and heel-to-shin bilaterally.  Gait and station: Station is normal.   Gait is normal. Tandem gait is slightly wide. Romberg is negative.   Reflexes: Deep tendon reflexes are symmetric and normal bilaterally.        DIAGNOSTIC DATA (LABS, IMAGING, TESTING) - I reviewed patient records, labs, notes, testing and imaging myself where available.  Lab Results  Component Value Date   WBC 12.9* 11/24/2015   HGB 11.6* 11/24/2015   HCT 33.8* 11/24/2015   MCV 91.7 11/24/2015   PLT 331 11/12/2015      Component Value Date/Time   NA 139 11/24/2015 1113   K 4.1 11/24/2015 1113   CL 104 11/24/2015 1113   CO2 25 11/24/2015 1113   GLUCOSE 118* 11/24/2015 1113   BUN 15 11/24/2015 1113   CREATININE 1.19* 11/24/2015 1113   CREATININE 1.02* 11/15/2015 0726   CALCIUM 8.9 11/24/2015 1113   PROT 6.1 11/24/2015 1113   ALBUMIN 3.5* 11/24/2015 1113   AST 27 11/24/2015 1113   ALT 39* 11/24/2015 1113   ALKPHOS 51 11/24/2015 1113   BILITOT 0.3 11/24/2015 1113   GFRNONAA 56* 11/24/2015 1113   GFRNONAA >60 11/15/2015 0726   GFRAA 65 11/24/2015 1113   GFRAA >60 11/15/2015 0726   Lab Results  Component Value Date   CHOL 128 10/25/2015   HDL 33* 10/25/2015   LDLCALC 76  10/25/2015   TRIG 97 10/25/2015   CHOLHDL  3.9 10/25/2015   Lab Results  Component Value Date   HGBA1C 6.2 11/24/2015   Lab Results  Component Value Date   VITAMINB12 541 11/12/2015   Lab Results  Component Value Date   TSH 0.83 11/24/2015       ASSESSMENT AND PLAN  Transverse myelitis (HCC)  Numbness  Right arm numbness    1.  Tramadol 50 mg po 3-4 times a day. 2.   Gabapentin 300 - 300 - 600.   If not better next week, consider adding lamotrigine.    3.   Continue to be active 4.  Recheck her MRI in a couple months to look for resolution and to rule out additional lesions that might help determine the etiology. At this time, she does not meet the criteria for seronegative neuromyelitis optica as she has had only one attack.   5.   She will return to see me in 4-5 weeks or sooner if there are new or worsening neurologic symptoms.   Richard A. Felecia Shelling, MD, PhD [id number], [id number] AM Certified in Neurology, Clinical Neurophysiology, Sleep Medicine, Pain Medicine and Neuroimaging  [facility] [address], [place], [place] [address] [phone]

## 2016-01-25 ENCOUNTER — Ambulatory Visit (INDEPENDENT_AMBULATORY_CARE_PROVIDER_SITE_OTHER): Payer: Federal, State, Local not specified - PPO | Admitting: Neurology

## 2016-01-25 ENCOUNTER — Encounter: Payer: Self-pay | Admitting: Neurology

## 2016-01-25 VITALS — BP 142/86 | HR 68 | Resp 16 | Ht 62.5 in | Wt 161.5 lb

## 2016-01-25 DIAGNOSIS — R200 Anesthesia of skin: Secondary | ICD-10-CM

## 2016-01-25 DIAGNOSIS — R269 Unspecified abnormalities of gait and mobility: Secondary | ICD-10-CM

## 2016-01-25 DIAGNOSIS — M542 Cervicalgia: Secondary | ICD-10-CM

## 2016-01-25 DIAGNOSIS — G373 Acute transverse myelitis in demyelinating disease of central nervous system: Secondary | ICD-10-CM | POA: Diagnosis not present

## 2016-01-25 NOTE — Progress Notes (Signed)
[facility]  PATIENT: Anna Henson DOB: [birth year]  REFERRING DOCTOR OR PCP:  Rikki Spearing SOURCE: Patient, outpatient and inpatient records in EMR, laboratory and imaging reports in EMR, MRI images on PACS.  _________________________________   HISTORICAL  CHIEF COMPLAINT:  Chief Complaint  Patient presents with  . Transverse Myelitis    Sts. she has had more neck and right shoulder pain.  Gabapentin helps but causes excessive daytime drowsiness.  even if she just takes 1-2 tabs daily.  She also has new c/o tingling in bottoms of feet.  Worse when standing./fim  . Neck Pain  . Depression    Sts. she has intermittent feelings of sadness, tearful episodes over the last week./fim    HISTORY OF PRESENT ILLNESS:  Anna Henson is a 45 yo woman with a transverse myelitis in late January 2017.      She is experiencing a Lhermite sign in her neck when she flexes.      She is also noting gait is still reduced.     Balance is poor. She experiences a burning dysesthetic sensation in her legs, worse if she walks more.      She is experiencing pain in her back between the shoulder blades.  She feels the numbness has changed to a painful tingling in that region.   She feels a more burning sensation.. Water from a shower is also painful in that region.  Tramadol and gabapentin helps a little   Tylenol has slightly helped the pain  She denies any new weakness or change in balance.   She feels her gait is slower and she tires out more rapidly.    Her right arm is clumsy and her handwriting is much worse.   She denies any significant change in bladder function.   She has a Lhermite sign and flexing also increases the numbness.    She is sleeping worse since the pain started.  Labwork and the lupus anticoagulant and antiphospholipid tests were normal.  Transverse myelitis history:    In late January,  she had a right sided headache and went to bed.  When she woke up, she noted numbness  in the right neck, shoulder and arm.   Numbness worsened over the next fe days.   She went to her PCP an was told she might have sinusitis and Amoxicillin was prescribed.   After a couple more days, she saw another doctor and had a neck soft tissue CT scan.   That CT scan showed that she had an increase of adenoid tissue and reactive type lymph nodes.  By that time, numbness extended to the fingertips on the riht but not into her leg.   She was told to go to the emergency room for further evaluation. In the emergency room she had an MRI of the cervical spine and the brain. The MRI of the cervical spine showed a focus that extended from the lower medulla on the right down to C5 posteriorly and more medially within the spinal cord. She also had some enhancement around the level of the cervical medullary junction. She was admitted to the hospital for further treatment and testing.    She was treated with 5 days of IV Solu-Medrol. During that time, her symptoms of numbness improved.  She has not returned to baseline.    She had many tests performed including lumbar puncture for CSF analysis and a thoracic spine MRI and serology blood tests. For the most part, the evaluation  was negative.  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 The enhancement does not extend up to the medulla.  Cervical spine also showed mild spinal stenosis at C3-C4 and mild to moderate spinal stenosis at C5-C6 and mild spinal stenosis at C6-C7. There is some foraminal narrowing at those levels but no definite nerve root compression.  Elsewhere the brain, there were some subcortical and deep white matter T2/FLAIR hyperintense foci but not in a pattern that would be typical for MS. The thoracic spine was normal.    I also reviewed her many laboratory tests performed while she was at [employer]. Neuromyelitis optica antibodies are negative. She did not have oligoclonal bands. ANCA, ANA, SSA/SSB,  ACE and Vit B-12 were all normal or negative.  She had Epstein-Barr IgG but not IgM. CSF cell counts did not show increase in white blood cells.  REVIEW OF SYSTEMS: Constitutional: No fevers, chills, sweats, or change in appetite.  She notes some fatigue Eyes: No visual changes, double vision, eye pain Ear, nose and throat: No hearing loss, ear pain, nasal congestion, sore throat Cardiovascular: No chest pain, palpitations.   H/O angina, CAD with stent Respiratory: No shortness of breath at rest or with exertion.   No wheezes GastrointestinaI: No nausea, vomiting, diarrhea, abdominal pain, fecal incontinence Genitourinary: No dysuria, urinary retention or frequency.  No nocturia. Musculoskeletal: No neck pain, back pain Integumentary: No rash, pruritus, skin lesions Neurological: as above Psychiatric: No depression at this time.  No anxiety Endocrine: No palpitations, diaphoresis, change in appetite, change in weigh or increased thirst Hematologic/Lymphatic: No anemia, purpura, petechiae. Allergic/Immunologic: No itchy/runny eyes, nasal congestion, recent allergic reactions, rashes  ALLERGIES: No Known Allergies  HOME MEDICATIONS:  Current outpatient prescriptions:  .  aspirin 81 MG chewable tablet, Chew 1 tablet (81 mg total) by mouth daily., Disp: , Rfl:  .  atorvastatin (LIPITOR) 80 MG tablet, Take 1 tablet (80 mg total) by mouth daily., Disp: 90 tablet, Rfl: 1 .  fluticasone (FLONASE) 50 MCG/ACT nasal spray, Place 2 sprays into both nostrils daily., Disp: 16 g, Rfl: 12 .  gabapentin (NEURONTIN) 300 MG capsule, Take 1 capsule (300 mg total) by mouth 4 (four) times daily., Disp: 120 capsule, Rfl: 11 .  metoprolol (LOPRESSOR) 50 MG tablet, TAKE 1 TABLET BY MOUTH TWICE DAILY. NEED OFFICE VISIT FOR FURTHER REFILLS, Disp: 60 tablet, Rfl: 4 .  traMADol (ULTRAM) 50 MG tablet, Take 1 tablet (50 mg total) by mouth 4 (four) times daily as needed., Disp: 120 tablet, Rfl: 2 .   triamterene-hydrochlorothiazide (DYAZIDE) 37.5-25 MG capsule, TAKE 1 CAPSULE BY MOUTH EVERY DAY, Disp: 30 capsule, Rfl: 4  PAST MEDICAL HISTORY: Past Medical History  Diagnosis Date  . Hypertension     > 5 years  . Chronic kidney disease     she sthinks has stage 2 CKD, has had US doppler and has some stenosis , this was done in [place]   . Hyperlipidemia   . ACS (acute coronary syndrome) ([place]) 09/13/2014    Unstable anginal pain s/p 2.25 x 12 mm Promus Premier DES to the RCA  . CAD in native artery   . Tobacco abuse 09/13/2014  . CKD (chronic kidney disease), stage II     PAST SURGICAL HISTORY: Past Surgical History  Procedure Laterality Date  . Hernia repair    . Coronary angioplasty with stent placement      L main  OK, LAD OK, CFX 30%, RCA 90%-0% w/ 2.25 x 12 mm Promus Premier DES, EF 55%  . Left heart catheterization with coronary angiogram N/A 09/14/2014    Procedure: LEFT HEART CATHETERIZATION WITH CORONARY ANGIOGRAM;  Surgeon: Burnell Blanks, MD;  Location[facility] CATH LAB;  Service: Cardiovascular;  Laterality: N/A;  . Percutaneous coronary stent intervention (pci-s)  09/14/2014    Procedure: PERCUTANEOUS CORONARY STENT INTERVENTION (PCI-S);  Surgeon: Burnell Blanks, MD;  Location: [facility] CATH LAB;  Service: Cardiovascular;;  . Tonsillectomy  2005    10 years ago    FAMILY HISTORY: Family History  Problem Relation Age of Onset  . Diabetes Mother   . Heart disease Mother   . Hyperlipidemia Mother   . Hypertension Mother   . Lung cancer Father     SOCIAL HISTORY:  Social History   Social History  . Marital Status: Single    Spouse Name: N/A  . Number of Children: 0  . Years of Education: masters   Occupational History  .      [employer]   Social History Main Topics  . Smoking status: Former Smoker -- 0.50 packs/day    Types: Cigarettes  . Smokeless tobacco: Never Used     Comment: on patch since 09/09/14  . Alcohol Use: No  . Drug Use: No  .  Sexual Activity: Not on file   Other Topics Concern  . Not on file   Social History Narrative   Consumes 2 cups of caffeine daily     PHYSICAL EXAM  Filed Vitals:   01/25/16 1440  BP: 142/86  Pulse: 68  Resp: 16  Height: 5' 2.5" (1.588 m)  Weight: 161 lb 8 oz (73.256 kg)    Body mass index is 29.05 kg/(m^2).   General: The patient is well-developed and well-nourished and in no acute distress  Neurologic Exam  Mental status: The patient is alert and oriented x 3 at the time of the examination. The patient has apparent normal recent and remote memory, with an apparently normal attention span and concentration ability.   Speech is normal.  Cranial nerves: Extraocular movements are full.  There is good facial sensation to soft touch bilaterally.Facial strength is normal.  Trapezius and sternocleidomastoid strength is normal. No dysarthria is noted.  The tongue is midline, and the patient has symmetric elevation of the soft palate. No obvious hearing deficits are noted.  Motor:  Muscle bulk is normal.   Tone is normal. Strength is  5 / 5 in all 4 extremities.   Sensory: She reports hyperpathia over the right chest, neck and upper back.  The right arm feels numb to touch.  .   Sensory testing is intact to pinprick, soft touch and vibration sensation in her legs.  Coordination: Cerebellar testing reveals good finger-nose-finger and heel-to-shin bilaterally.  Gait and station: Station is normal.   Gait is normal. Tandem gait is slightly wide. Romberg is negative.   Reflexes: Deep tendon reflexes are symmetric and normal bilaterally.        DIAGNOSTIC DATA (LABS, IMAGING, TESTING) - I reviewed patient records, labs, notes, testing and imaging myself where available.  Lab Results  Component Value Date   WBC 12.9* 11/24/2015   HGB 11.6* 11/24/2015   HCT 33.8* 11/24/2015   MCV 91.7 11/24/2015   PLT 331 11/12/2015      Component Value Date/Time   NA 139 11/24/2015 1113    K 4.1 11/24/2015 1113   CL  104 11/24/2015 1113   CO2 25 11/24/2015 1113   GLUCOSE 118* 11/24/2015 1113   BUN 15 11/24/2015 1113   CREATININE 1.19* 11/24/2015 1113   CREATININE 1.02* 11/15/2015 0726   CALCIUM 8.9 11/24/2015 1113   PROT 6.1 11/24/2015 1113   ALBUMIN 3.5* 11/24/2015 1113   AST 27 11/24/2015 1113   ALT 39* 11/24/2015 1113   ALKPHOS 51 11/24/2015 1113   BILITOT 0.3 11/24/2015 1113   GFRNONAA 56* 11/24/2015 1113   GFRNONAA >60 11/15/2015 0726   GFRAA 65 11/24/2015 1113   GFRAA >60 11/15/2015 0726   Lab Results  Component Value Date   CHOL 128 10/25/2015   HDL 33* 10/25/2015   LDLCALC 76 10/25/2015   TRIG 97 10/25/2015   CHOLHDL 3.9 10/25/2015   Lab Results  Component Value Date   HGBA1C 6.2 11/24/2015   Lab Results  Component Value Date   VITAMINB12 541 11/12/2015   Lab Results  Component Value Date   TSH 0.83 11/24/2015       ASSESSMENT AND PLAN  Transverse myelitis (HCC)  Neck pain  Numbness  Gait disturbance    1.  Tramadol prn. 2.   Gabapentin 300 - 300 - 600.    3.   Continue to be active, exercise 4.  Recheck her MRI brain and cervical spine in May to rule out additional lesions that might help determine the etiology. At this time, she does not meet the criteria for seronegative neuromyelitis optica as she has had only one attack.   5.   She will return to see me in 3 months or sooner if there are new or worsening neurologic symptoms.   Richard A. Felecia Shelling, MD, PhD [id number], [id number] PM Certified in Neurology, Clinical Neurophysiology, Sleep Medicine, Pain Medicine and Neuroimaging  [facility] [address] [place], [place] [address] [phone]

## 2016-01-28 ENCOUNTER — Other Ambulatory Visit: Payer: Self-pay | Admitting: Internal Medicine

## 2016-02-02 ENCOUNTER — Ambulatory Visit (INDEPENDENT_AMBULATORY_CARE_PROVIDER_SITE_OTHER): Payer: Federal, State, Local not specified - PPO

## 2016-02-02 DIAGNOSIS — R200 Anesthesia of skin: Secondary | ICD-10-CM

## 2016-02-02 DIAGNOSIS — G373 Acute transverse myelitis in demyelinating disease of central nervous system: Secondary | ICD-10-CM | POA: Diagnosis not present

## 2016-02-03 MED ORDER — GADOPENTETATE DIMEGLUMINE 469.01 MG/ML IV SOLN
16.0000 mL | Freq: Once | INTRAVENOUS | Status: DC | PRN
Start: 2016-02-03 — End: 2019-12-15

## 2016-02-07 ENCOUNTER — Telehealth: Payer: Self-pay | Admitting: *Deleted

## 2016-02-07 MED ORDER — AZITHROMYCIN 250 MG PO TABS: ORAL_TABLET | ORAL | Status: DC

## 2016-02-07 NOTE — Telephone Encounter (Signed)
I have spoken with Anna Henson this afternoon, and per RAS, advised that mri c-spine looks much better; that the large lesion has completely resolved, and the mri brain looks the same--no worrisome lesions present.  There is some sinusitis present.  She verbalized understanding of same, sts. she has had sx. of sinusitis.  Z-pk escribed to [facility] per her request/fim

## 2016-02-07 NOTE — Telephone Encounter (Signed)
-----   Message from Britt Bottom, MD sent at 02/05/2016  3:40 PM EDT ----- Please let her know that the MRI of the cervical spine looks much much better. The large lesion has completely resolved.  The MRI of the brain looks the same without any worrisome lesions.   However, she does have some sphenoid sinusitis not present in February. If she is reporting any headaches, call in a Z-Pak for her.    Thank you.

## 2016-02-22 ENCOUNTER — Ambulatory Visit (INDEPENDENT_AMBULATORY_CARE_PROVIDER_SITE_OTHER): Payer: Federal, State, Local not specified - PPO | Admitting: Physician Assistant

## 2016-02-22 VITALS — BP 136/90 | HR 70 | Temp 97.8°F | Resp 18 | Ht 62.5 in | Wt 156.4 lb

## 2016-02-22 DIAGNOSIS — Z8639 Personal history of other endocrine, nutritional and metabolic disease: Secondary | ICD-10-CM

## 2016-02-22 DIAGNOSIS — N898 Other specified noninflammatory disorders of vagina: Secondary | ICD-10-CM | POA: Diagnosis not present

## 2016-02-22 LAB — POCT WET + KOH PREP
Trich by wet prep: ABSENT
Yeast by KOH: ABSENT
Yeast by wet prep: ABSENT

## 2016-02-22 LAB — POCT GLYCOSYLATED HEMOGLOBIN (HGB A1C): Hemoglobin A1C: 5.8

## 2016-02-22 MED ORDER — METRONIDAZOLE 500 MG PO TABS: 500.0000 mg | ORAL_TABLET | Freq: Two times a day (BID) | ORAL | Status: AC

## 2016-02-22 NOTE — Progress Notes (Signed)
[facility] [address] [place] [address] [phone]  Date:  02/22/2016   Name:  Anna Henson   DOB:  Apr 19, 1971   MRN:  [id number]  PCP:  Leotis Pain, DO   Chief Complaint  Patient presents with  . Vaginal Discharge    x was dx with BV month ago still has not cleared up    History of Present Illness:  Anna Henson is a 45 y.o. female patient who presents to [facility] for cc of vaginal discharge.   1 month ago, dxd with BV at [facility].  Metronidazole pills. Pruritus and vaginal discharge at this time.  She has minimal abdominal pain but menses is due.  There is minimal odor.  No dysuria, hematuria, or frequency. Patient's last menstrual period was 01/31/2016.     Patient Active Problem List   Diagnosis Date Noted  . Gait disturbance 01/25/2016  . 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 Acute-on-chronic kidney injury ([place]) 11/12/2015  . Neck pain 11/12/2015  . Hypokalemia 11/12/2015  . Abnormal MRI, neck 11/12/2015  . Numbness 11/12/2015  . Hypertension   . Hyperlipidemia   . CAD in native artery   . Facial numbness   . Right arm numbness   . Throat pain 11/09/2015  . Drooling 11/09/2015  . CAD (coronary artery disease), native coronary artery 09/15/2014  . Headache 09/15/2014  . UTI (urinary tract infection): Probable 09/14/2014  . Candida infection of genital region 09/14/2014  . Unstable angina ([place]) 09/13/2014  . HLD (hyperlipidemia) 09/13/2014  . Otitis 09/13/2014  . ACS (acute coronary syndrome) ([place]) 09/13/2014  . Tobacco abuse 09/13/2014  . Essential hypertension 09/09/2014    Past Medical History  Diagnosis Date  . Hypertension     > 5 years  . Chronic kidney disease     she sthinks has stage 2 CKD, has had US doppler and has some stenosis , this was done in [place]   . Hyperlipidemia   . ACS (acute coronary syndrome) ([place]) 09/13/2014    Unstable anginal pain s/p 2.25 x 12 mm Promus Premier DES to the RCA  . CAD in native  artery   . Tobacco abuse 09/13/2014  . CKD (chronic kidney disease), stage II     Past Surgical History  Procedure Laterality Date  . Hernia repair    . Coronary angioplasty with stent placement      L main OK, LAD OK, CFX 30%, RCA 90%-0% w/ 2.25 x 12 mm Promus Premier DES, EF 55%  . Left heart catheterization with coronary angiogram N/A 09/14/2014    Procedure: LEFT HEART CATHETERIZATION WITH CORONARY ANGIOGRAM;  Surgeon: Burnell Blanks, MD;  Location: [facility] CATH LAB;  Service: Cardiovascular;  Laterality: N/A;  . Percutaneous coronary stent intervention (pci-s)  09/14/2014    Procedure: PERCUTANEOUS CORONARY STENT INTERVENTION (PCI-S);  Surgeon: Burnell Blanks, MD;  Location: [facility] CATH LAB;  Service: Cardiovascular;;  . Tonsillectomy  2005    10 years ago    Social History  Substance Use Topics  . Smoking status: Former Smoker -- 0.50 packs/day    Types: Cigarettes  . Smokeless tobacco: Never Used     Comment: on patch since 09/09/14  . Alcohol Use: No    Family History  Problem Relation Age of Onset  . Diabetes Mother   . Heart disease Mother   . Hyperlipidemia Mother   . Hypertension Mother   . Lung cancer Father  No Known Allergies  Medication list has been reviewed and updated.  Current Outpatient Prescriptions on File Prior to Visit  Medication Sig Dispense Refill  . aspirin 81 MG chewable tablet Chew 1 tablet (81 mg total) by mouth daily.    Marland Kitchen atorvastatin (LIPITOR) 80 MG tablet Take 1 tablet (80 mg total) by mouth daily. 90 tablet 1  . fluticasone (FLONASE) 50 MCG/ACT nasal spray Place 2 sprays into both nostrils daily. 16 g 12  . gabapentin (NEURONTIN) 300 MG capsule Take 1 capsule (300 mg total) by mouth 4 (four) times daily. 120 capsule 11  . metoprolol (LOPRESSOR) 50 MG tablet TAKE 1 TABLET BY MOUTH TWICE DAILY. NEED OFFICE VISIT FOR FURTHER REFILLS 60 tablet 4  . traMADol (ULTRAM) 50 MG tablet Take 1 tablet (50 mg total) by mouth 4 (four) times  daily as needed. 120 tablet 2  . triamterene-hydrochlorothiazide (DYAZIDE) 37.5-25 MG capsule TAKE 1 CAPSULE BY MOUTH EVERY DAY 90 capsule 0   Current Facility-Administered Medications on File Prior to Visit  Medication Dose Route Frequency Provider Last Rate Last Dose  . gadopentetate dimeglumine (MAGNEVIST) injection 16 mL  16 mL Intravenous Once PRN Britt Bottom, MD        ROS ROS otherwise unremarkable unless listed above.  Physical Examination: BP 136/90 mmHg  Pulse 70  Temp(Src) 97.8 F (36.6 C) (Oral)  Resp 18  Ht 5' 2.5" (1.588 m)  Wt 156 lb 6.4 oz (70.943 kg)  BMI 28.13 kg/m2  SpO2 100%  LMP 01/31/2016 Ideal Body Weight: Weight in (lb) to have BMI = 25: 138.6  Physical Exam  Constitutional: She is oriented to person, place, and time. She appears well-developed and well-nourished. No distress.  HENT:  Head: Normocephalic and atraumatic.  Right Ear: External ear normal.  Left Ear: External ear normal.  Eyes: Conjunctivae and EOM are normal. Pupils are equal, round, and reactive to light.  Cardiovascular: Normal rate.   Pulmonary/Chest: Effort normal. No respiratory distress.  Genitourinary: Pelvic exam was performed with patient supine. There is no rash on the right labia. There is no rash on the left labia. Cervix exhibits discharge. Cervix exhibits no friability. No erythema in the vagina. No foreign body around the vagina. Vaginal discharge found.  Neurological: She is alert and oriented to person, place, and time.  Skin: She is not diaphoretic.  Psychiatric: She has a normal mood and affect. Her behavior is normal.   Results for orders placed or performed in visit on 02/22/16  POCT Wet + KOH Prep  Result Value Ref Range   Yeast by KOH Absent Present, Absent   Yeast by wet prep Absent Present, Absent   WBC by wet prep Few None, Few, Too numerous to count   Clue Cells Wet Prep HPF POC None None, Too numerous to count   Trich by wet prep Absent Present, Absent    Bacteria Wet Prep HPF POC Many (A) None, Few, Too numerous to count   Epithelial Cells By [employer] ([facility]) Many (A) None, Few, Too numerous to count   RBC,UR,HPF,POC None None RBC/hpf  POCT glycosylated hemoglobin (Hb A1C)  Result Value Ref Range   Hemoglobin A1C 5.8      Assessment and Plan: Anna Henson is a 45 y.o. female who is here today for cc of vaginal discharge. Will treat for bv at this time.   Glucose borderline, and not causing vaginal changes.   Vaginal discharge - Plan: POCT Wet + KOH Prep, metroNIDAZOLE (FLAGYL)  500 MG tablet, CANCELED: Hemoglobin A1C  History of elevated glucose - Plan: POCT glycosylated hemoglobin (Hb A1C), CANCELED: Hemoglobin A1C  Ivar Drape, PA-C [facility] 02/22/2016 6:30 PM

## 2016-02-22 NOTE — Patient Instructions (Addendum)
     IF you received an x-ray today, you will receive an invoice from [facility]. Please contact [facility] at [phone] with questions or concerns regarding your invoice.   IF you received labwork today, you will receive an invoice from [employer]. Please contact [facility] at [phone] with questions or concerns regarding your invoice.   Our billing staff will not be able to assist you with questions regarding bills from these companies.  You will be contacted with the lab results as soon as they are available. The fastest way to get your results is to activate your My Chart account. Instructions are located on the last page of this paperwork. If you have not heard from [place] regarding the results in 2 weeks, please contact this office.    Please take medication as prescribed.  We will treat you for possible BV.  If you continue to have the symptoms, please return to your ob/gyn.  Please take a probiotic supplement with lactobacillus, or increase yogurt intake.

## 2016-03-04 ENCOUNTER — Other Ambulatory Visit: Payer: Self-pay | Admitting: Internal Medicine

## 2016-03-07 ENCOUNTER — Other Ambulatory Visit: Payer: Self-pay | Admitting: Physician Assistant

## 2016-04-06 ENCOUNTER — Other Ambulatory Visit: Payer: Self-pay | Admitting: Physician Assistant

## 2016-04-06 ENCOUNTER — Other Ambulatory Visit: Payer: Self-pay | Admitting: Internal Medicine

## 2016-04-08 ENCOUNTER — Other Ambulatory Visit: Payer: Self-pay | Admitting: Family Medicine

## 2016-04-08 ENCOUNTER — Other Ambulatory Visit: Payer: Self-pay | Admitting: Physician Assistant

## 2016-04-08 DIAGNOSIS — E785 Hyperlipidemia, unspecified: Secondary | ICD-10-CM

## 2016-04-08 NOTE — Telephone Encounter (Signed)
Pt is due for 6 mos HTN f/up in July so I only gave her a 30 day RF, but pharm sent back a req saying that pt is requesting a 90 day. Anna Henson, you just saw pt for another issue in May, do you want to [facility] a 90 day supply?

## 2016-04-08 NOTE — Telephone Encounter (Signed)
Pt is due for 6 mos chol f/up in July so I only gave her a 30 day RF, but pharm sent back a req saying that pt is requesting a 90 day. Colletta [place], you just saw pt for another issue in May, do you want to [facility] a 90 day supply? Last lipid test in [facility] in Jan was good.

## 2016-04-09 NOTE — Telephone Encounter (Signed)
This is fine.   She is at goal.  And cardiologist has requested annual for follow up.  Sent over.  Advise patient.

## 2016-04-15 NOTE — Telephone Encounter (Signed)
I would give her a 30 day extra at this time past yours.  She needs to follow up.

## 2016-04-17 ENCOUNTER — Other Ambulatory Visit: Payer: Self-pay | Admitting: Internal Medicine

## 2016-04-17 ENCOUNTER — Ambulatory Visit (INDEPENDENT_AMBULATORY_CARE_PROVIDER_SITE_OTHER): Payer: Federal, State, Local not specified - PPO | Admitting: Neurology

## 2016-04-17 ENCOUNTER — Encounter: Payer: Self-pay | Admitting: Neurology

## 2016-04-17 ENCOUNTER — Telehealth: Payer: Self-pay | Admitting: Neurology

## 2016-04-17 VITALS — BP 154/90 | HR 74 | Resp 14 | Ht 62.5 in | Wt 156.5 lb

## 2016-04-17 DIAGNOSIS — G373 Acute transverse myelitis in demyelinating disease of central nervous system: Secondary | ICD-10-CM

## 2016-04-17 DIAGNOSIS — R269 Unspecified abnormalities of gait and mobility: Secondary | ICD-10-CM | POA: Diagnosis not present

## 2016-04-17 DIAGNOSIS — M62838 Other muscle spasm: Secondary | ICD-10-CM

## 2016-04-17 DIAGNOSIS — R200 Anesthesia of skin: Secondary | ICD-10-CM

## 2016-04-17 MED ORDER — TRAMADOL HCL 50 MG PO TABS: 50.0000 mg | ORAL_TABLET | Freq: Four times a day (QID) | ORAL | Status: DC | PRN

## 2016-04-17 MED ORDER — BACLOFEN 10 MG PO TABS: ORAL_TABLET | ORAL | Status: DC

## 2016-04-17 NOTE — Progress Notes (Signed)
[facility]  PATIENT: Anna Henson DOB: [birth year]  REFERRING DOCTOR OR PCP:  Rikki Spearing SOURCE: Patient, outpatient and inpatient records in EMR, laboratory and imaging reports in EMR, MRI images on PACS.  _________________________________   HISTORICAL  CHIEF COMPLAINT:  Chief Complaint  Patient presents with  . Transverse Myelitis    Hx. of transverse myelitis.  Repeat MRI brain and c-spine on 02-04-16 showed improvement, with resolution of large lesion.  She is back today with c/o increased spasms in bilat calves for the last 2-3 days.  Also sts. she has intermittent electrical jolt sensation right knee, right upper arm, onset about 3 weeks ago. Sts. she continues taking Tramadol prn, and Gabapentin 300-300-600 with minimal relief/fim  . Neck Pain    HISTORY OF PRESENT ILLNESS:  Anna Henson is a 45 yo woman with a transverse myelitis in late January 2017.      Symptoms improved some.   In April, she reported more numbness and repeat imaging was performed.  I reviewed the MRI's from 02/02/16.   The cervical spine shows resolution of the prior enhancing focus that was seen at the cervicomedullary junction     Since April, she reports that her legs have worsened.    She gets a Geographical information systems officer Horse sensation in her legs.   She also has an electric sensation in her right knee area and pain in the right arm with a Charley Horse sensation and pain in her fingers at times.     She also reports dizziness and headache frequently.    She still is experiencing a [place] sign in her neck when she flexes neck far (better than earlier in year).     She is also noting gait is still reduced.     Balance is poor. She experiences a burning dysesthetic sensation in her legs, worse if she walks more.      Gait/strength/coordination:    She feels her gait is slowand she tires out more rapidly.    Her right arm is clumsy and her handwriting is much worse.     Bladder:   She denies any  significant change in bladder function.   Labwork and the lupus anticoagulant and antiphospholipid tests were normal.  Transverse myelitis history:    In late January,  she had a right sided headache and went to bed.  When she woke up, she noted numbness in the right neck, shoulder and arm.   Numbness worsened over the next fe days.   She went to her PCP an was told she might have sinusitis and Amoxicillin was prescribed.   After a couple more days, she saw another doctor and had a neck soft tissue CT scan.   That CT scan showed that she had an increase of adenoid tissue and reactive type lymph nodes.  By that time, numbness extended to the fingertips on the riht but not into her leg.   She was told to go to the emergency room for further evaluation. In the emergency room she had an MRI of the cervical spine and the brain. The MRI of the cervical spine showed a focus that extended from the lower medulla on the right down to C5 posteriorly and more medially within the spinal cord. She also had some enhancement around the level of the cervical medullary junction. She was admitted to the hospital for further treatment and testing.    She was treated with 5 days of IV Solu-Medrol. During that time, her  symptoms of numbness improved.  She has not returned to baseline.    She had many tests performed including lumbar puncture for CSF analysis and a thoracic spine MRI and serology blood tests. For the most part, the evaluation was negative.  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 The enhancement does not extend up to the medulla.  Cervical spine also showed mild spinal stenosis at C3-C4 and mild to moderate spinal stenosis at C5-C6 and mild spinal stenosis at C6-C7. There is some foraminal narrowing at those levels but no definite nerve root compression.  Elsewhere the brain, there were some subcortical and deep white matter T2/FLAIR hyperintense foci but not in  a pattern that would be typical for MS. The thoracic spine was normal.    I also reviewed her many laboratory tests performed while she was at [facility]. Neuromyelitis optica antibodies are negative. She did not have oligoclonal bands. ANCA, ANA, SSA/SSB, ACE and Vit B-12 were all normal or negative.  She had Epstein-Barr IgG but not IgM. CSF cell counts did not show increase in white blood cells.  REVIEW OF SYSTEMS: Constitutional: No fevers, chills, sweats, or change in appetite.  She notes some fatigue Eyes: No visual changes, double vision, eye pain Ear, nose and throat: No hearing loss, ear pain, nasal congestion, sore throat Cardiovascular: No chest pain, palpitations.   H/O angina, CAD with stent Respiratory: No shortness of breath at rest or with exertion.   No wheezes GastrointestinaI: No nausea, vomiting, diarrhea, abdominal pain, fecal incontinence Genitourinary: No dysuria, urinary retention or frequency.  No nocturia. Musculoskeletal: No neck pain, back pain Integumentary: No rash, pruritus, skin lesions Neurological: as above Psychiatric: No depression at this time.  No anxiety Endocrine: No palpitations, diaphoresis, change in appetite, change in weigh or increased thirst Hematologic/Lymphatic: No anemia, purpura, petechiae. Allergic/Immunologic: No itchy/runny eyes, nasal congestion, recent allergic reactions, rashes  ALLERGIES: No Known Allergies  HOME MEDICATIONS:  Current outpatient prescriptions:  .  aspirin 81 MG chewable tablet, Chew 1 tablet (81 mg total) by mouth daily., Disp: , Rfl:  .  atorvastatin (LIPITOR) 80 MG tablet, TAKE 1 TABLET BY MOUTH EVERY DAY, Disp: 90 tablet, Rfl: 0 .  fluticasone (FLONASE) 50 MCG/ACT nasal spray, Place 2 sprays into both nostrils daily., Disp: 16 g, Rfl: 12 .  gabapentin (NEURONTIN) 300 MG capsule, Take 1 capsule (300 mg total) by mouth 4 (four) times daily., Disp: 120 capsule, Rfl: 11 .  metoprolol (LOPRESSOR) 50 MG tablet,  TAKE 1 TABLET BY MOUTH TWICE DAILY. NEED OFFICE VISIT FOR FURTHER REFILLS, Disp: 60 tablet, Rfl: 4 .  traMADol (ULTRAM) 50 MG tablet, Take 1 tablet (50 mg total) by mouth 4 (four) times daily as needed., Disp: 120 tablet, Rfl: 2 .  triamterene-hydrochlorothiazide (DYAZIDE) 37.5-25 MG capsule, TAKE 1 CAPSULE BY MOUTH EVERY DAY, Disp: 30 capsule, Rfl: 0 No current facility-administered medications for this visit.  Facility-Administered Medications Ordered in Other Visits:  .  gadopentetate dimeglumine (MAGNEVIST) injection 16 mL, 16 mL, Intravenous, Once PRN, Britt Bottom, MD  PAST MEDICAL HISTORY: Past Medical History  Diagnosis Date  . Hypertension     > 5 years  . Chronic kidney disease     she sthinks has stage 2 CKD, has had US doppler and has some stenosis , this was done in [place]   . Hyperlipidemia   . ACS (acute coronary syndrome) ([place]) 09/13/2014  Unstable anginal pain s/p 2.25 x 12 mm Promus Premier DES to the RCA  . CAD in native artery   . Tobacco abuse 09/13/2014  . CKD (chronic kidney disease), stage II     PAST SURGICAL HISTORY: Past Surgical History  Procedure Laterality Date  . Hernia repair    . Coronary angioplasty with stent placement      L main OK, LAD OK, CFX 30%, RCA 90%-0% w/ 2.25 x 12 mm Promus Premier DES, EF 55%  . Left heart catheterization with coronary angiogram N/A 09/14/2014    Procedure: LEFT HEART CATHETERIZATION WITH CORONARY ANGIOGRAM;  Surgeon: Burnell Blanks, MD;  Location: [facility] CATH LAB;  Service: Cardiovascular;  Laterality: N/A;  . Percutaneous coronary stent intervention (pci-s)  09/14/2014    Procedure: PERCUTANEOUS CORONARY STENT INTERVENTION (PCI-S);  Surgeon: Burnell Blanks, MD;  Location: [facility] CATH LAB;  Service: Cardiovascular;;  . Tonsillectomy  2005    10 years ago    FAMILY HISTORY: Family History  Problem Relation Age of Onset  . Diabetes Mother   . Heart disease Mother   . Hyperlipidemia Mother   .  Hypertension Mother   . Lung cancer Father     SOCIAL HISTORY:  Social History   Social History  . Marital Status: Single    Spouse Name: N/A  . Number of Children: 0  . Years of Education: masters   Occupational History  .      [employer]   Social History Main Topics  . Smoking status: Former Smoker -- 0.50 packs/day    Types: Cigarettes  . Smokeless tobacco: Never Used     Comment: on patch since 09/09/14  . Alcohol Use: No  . Drug Use: No  . Sexual Activity: Not on file   Other Topics Concern  . Not on file   Social History Narrative   Consumes 2 cups of caffeine daily     PHYSICAL EXAM  Filed Vitals:   04/17/16 1123  BP: 154/90  Pulse: 74  Resp: 14  Height: 5' 2.5" (1.588 m)  Weight: 156 lb 8 oz (70.988 kg)    Body mass index is 28.15 kg/(m^2).   General: The patient is well-developed and well-nourished and in no acute distress  Neurologic Exam  Mental status: The patient is alert and oriented x 3 at the time of the examination. The patient has apparent normal recent and remote memory, with an apparently normal attention span and concentration ability.   Speech is normal.  Cranial nerves: Extraocular movements are full.  There is good facial sensation to soft touch bilaterally.Facial strength is normal.  Trapezius and sternocleidomastoid strength is normal. No dysarthria is noted.  The tongue is midline, and the patient has symmetric elevation of the soft palate. No obvious hearing deficits are noted.  Motor:  Muscle bulk is normal.   Tone is normal. Strength is  5 / 5 in all 4 extremities.   Sensory: She reports hyperpathia over the right chest, neck and upper back.  The right arm feels numb to touch.  .   Sensory testing is intact to pinprick, soft touch and vibration sensation in her legs.  Coordination: Cerebellar testing reveals good finger-nose-finger and heel-to-shin bilaterally.  Gait and station: Station is normal.   Gait is normal. Tandem  gait is slightly wide. Romberg is negative.   Reflexes: Deep tendon reflexes are symmetric and normal bilaterally.        DIAGNOSTIC DATA (LABS, IMAGING, TESTING) -  I reviewed patient records, labs, notes, testing and imaging myself where available.  Lab Results  Component Value Date   WBC 12.9* 11/24/2015   HGB 11.6* 11/24/2015   HCT 33.8* 11/24/2015   MCV 91.7 11/24/2015   PLT 331 11/12/2015      Component Value Date/Time   NA 139 11/24/2015 1113   K 4.1 11/24/2015 1113   CL 104 11/24/2015 1113   CO2 25 11/24/2015 1113   GLUCOSE 118* 11/24/2015 1113   BUN 15 11/24/2015 1113   CREATININE 1.19* 11/24/2015 1113   CREATININE 1.02* 11/15/2015 0726   CALCIUM 8.9 11/24/2015 1113   PROT 6.1 11/24/2015 1113   ALBUMIN 3.5* 11/24/2015 1113   AST 27 11/24/2015 1113   ALT 39* 11/24/2015 1113   ALKPHOS 51 11/24/2015 1113   BILITOT 0.3 11/24/2015 1113   GFRNONAA 56* 11/24/2015 1113   GFRNONAA >60 11/15/2015 0726   GFRAA 65 11/24/2015 1113   GFRAA >60 11/15/2015 0726   Lab Results  Component Value Date   CHOL 128 10/25/2015   HDL 33* 10/25/2015   LDLCALC 76 10/25/2015   TRIG 97 10/25/2015   CHOLHDL 3.9 10/25/2015   Lab Results  Component Value Date   HGBA1C 5.8 02/22/2016   Lab Results  Component Value Date   [id number] 11/12/2015   Lab Results  Component Value Date   TSH 0.83 11/24/2015       ASSESSMENT AND PLAN  Transverse myelitis (HCC)  Gait disturbance  Numbness  Muscle spasms of both lower extremities     1.  Tramadol prn for pain up to 4/day. 2.   Gabapentin 300 - 300 - 600.    3.   Continue to be active, exercise 4.  Baclofen for spasms titrate up to 4/day. 5.  She will return to see me in 3 months or sooner if there are new or worsening neurologic symptoms.   Richard A. Felecia Shelling, MD, PhD [id number], [id number] AM Certified in Neurology, Clinical Neurophysiology, Sleep Medicine, Pain Medicine and Neuroimaging  [facility] [address] [place], [place] [address] [phone]

## 2016-04-17 NOTE — Telephone Encounter (Signed)
Pt has called in complaining of charlie horses in both her upper calf's and her hands. She states there is an electrical feeling in both her knees. This all started over the weekend. Please call and advise [phone]

## 2016-04-17 NOTE — Telephone Encounter (Signed)
I have spoken with Anna Henson this morning and given appt. 11am today/fim

## 2016-04-25 ENCOUNTER — Telehealth: Payer: Self-pay | Admitting: Neurology

## 2016-04-25 ENCOUNTER — Ambulatory Visit: Payer: Federal, State, Local not specified - PPO | Admitting: Neurology

## 2016-04-25 NOTE — Telephone Encounter (Signed)
Patient called to advise she faxed 3 page letter to our office [phone] this morning around 10:00am, requests a call back if fax not received and call back after reviewing [phone].

## 2016-05-01 ENCOUNTER — Encounter: Payer: Self-pay | Admitting: *Deleted

## 2016-05-01 NOTE — Telephone Encounter (Signed)
Patient called to follow up on 3 page letter that she faxed to our office, states she hasn't heard back. Please call [phone].

## 2016-05-01 NOTE — Telephone Encounter (Signed)
PC with Anna Henson this afternoon and advised will call her when letter is available/fim

## 2016-05-02 NOTE — Telephone Encounter (Signed)
PC with pt. this morning and advised letter is ready to be picked up in the office--it is up front [employer].  She verbalized understanding of same/fim

## 2016-05-09 ENCOUNTER — Other Ambulatory Visit: Payer: Self-pay | Admitting: Family Medicine

## 2016-05-09 ENCOUNTER — Other Ambulatory Visit: Payer: Self-pay | Admitting: Physician Assistant

## 2016-05-25 ENCOUNTER — Other Ambulatory Visit: Payer: Self-pay | Admitting: Neurology

## 2016-05-28 ENCOUNTER — Other Ambulatory Visit: Payer: Self-pay | Admitting: Family Medicine

## 2016-05-31 ENCOUNTER — Other Ambulatory Visit: Payer: Self-pay | Admitting: Emergency Medicine

## 2016-05-31 DIAGNOSIS — E785 Hyperlipidemia, unspecified: Secondary | ICD-10-CM

## 2016-05-31 MED ORDER — METOPROLOL TARTRATE 50 MG PO TABS: ORAL_TABLET | ORAL | 3 refills | Status: DC

## 2016-05-31 MED ORDER — ATORVASTATIN CALCIUM 80 MG PO TABS: 80.0000 mg | ORAL_TABLET | Freq: Every day | ORAL | 0 refills | Status: DC

## 2016-05-31 MED ORDER — TRIAMTERENE-HCTZ 37.5-25 MG PO CAPS: 1.0000 | ORAL_CAPSULE | Freq: Every day | ORAL | 3 refills | Status: DC

## 2016-07-03 ENCOUNTER — Telehealth: Payer: Self-pay | Admitting: Neurology

## 2016-07-03 ENCOUNTER — Ambulatory Visit (INDEPENDENT_AMBULATORY_CARE_PROVIDER_SITE_OTHER): Payer: Federal, State, Local not specified - PPO | Admitting: Family Medicine

## 2016-07-03 VITALS — BP 120/72 | HR 78 | Temp 98.4°F | Resp 18 | Ht 62.5 in | Wt 150.0 lb

## 2016-07-03 DIAGNOSIS — R600 Localized edema: Secondary | ICD-10-CM

## 2016-07-03 DIAGNOSIS — G373 Acute transverse myelitis in demyelinating disease of central nervous system: Secondary | ICD-10-CM | POA: Diagnosis not present

## 2016-07-03 DIAGNOSIS — M7989 Other specified soft tissue disorders: Secondary | ICD-10-CM

## 2016-07-03 DIAGNOSIS — R208 Other disturbances of skin sensation: Secondary | ICD-10-CM | POA: Diagnosis not present

## 2016-07-03 LAB — CBC WITH DIFFERENTIAL/PLATELET
Basophils Absolute: 0 cells/uL (ref 0–200)
Basophils Relative: 0 %
Eosinophils Absolute: 297 cells/uL (ref 15–500)
Eosinophils Relative: 3 %
HCT: 38.2 % (ref 35.0–45.0)
Hemoglobin: 13.2 g/dL (ref 11.7–15.5)
Lymphocytes Relative: 25 %
Lymphs Abs: 2475 cells/uL (ref 850–3900)
MCH: 31.6 pg (ref 27.0–33.0)
MCHC: 34.6 g/dL (ref 32.0–36.0)
MCV: 91.4 fL (ref 80.0–100.0)
MPV: 9.4 fL (ref 7.5–12.5)
Monocytes Absolute: 495 cells/uL (ref 200–950)
Monocytes Relative: 5 %
Neutro Abs: 6633 cells/uL (ref 1500–7800)
Neutrophils Relative %: 67 %
Platelets: 409 10*3/uL — ABNORMAL HIGH (ref 140–400)
RBC: 4.18 MIL/uL (ref 3.80–5.10)
RDW: 14.5 % (ref 11.0–15.0)
WBC: 9.9 10*3/uL (ref 3.8–10.8)

## 2016-07-03 LAB — BASIC METABOLIC PANEL
BUN: 13 mg/dL (ref 7–25)
CO2: 21 mmol/L (ref 20–31)
Calcium: 9.6 mg/dL (ref 8.6–10.2)
Chloride: 104 mmol/L (ref 98–110)
Creat: 1.12 mg/dL — ABNORMAL HIGH (ref 0.50–1.10)
Glucose, Bld: 91 mg/dL (ref 65–99)
Potassium: 3.5 mmol/L (ref 3.5–5.3)
Sodium: 136 mmol/L (ref 135–146)

## 2016-07-03 LAB — TSH: TSH: 0.82 mIU/L

## 2016-07-03 NOTE — Progress Notes (Signed)
By signing my name below I, Tereasa Coop, attest that this documentation has been prepared under the direction and in the presence of Wendie Agreste, MD. Electonically Signed. Tereasa Coop, Scribe 07/03/2016 at 1:57 PM  Subjective:    Patient ID: Anna Henson, female    DOB: June 18, 1971, 45 y.o.   MRN: [id number]  Chief Complaint  Patient presents with  . Leg Swelling    feet and legs/warmness in knees    HPI Anna Henson is a 45 y.o. female who presents to the [facility] complaining of bilat knee and feet swelling for the past month. Swelling comes and goes every 3-4 days and is improved with elevating legs. Pt also reports increased warmth to left knee. Increased warmth is described as feeling like "a warm washcloth got put on" her rt knee. Pt has a buzzing, electric shock feeling in her rt knee that she has had since she was dx with transverse myelitis. Pt also c/o muscle spasms in her left arm for the past month. Pt states she kept her legs elevated most of the day yesterday. Pt states she does not know what triggers the swelling and that she could even be resting and relaxing and swelling starts.   Pt has picture of her feet from 05/31/16 showing the swelling shows markedly edematous feet bilat.   Pt has history of transverse myelitis that is followed by Dr Felecia Shelling. Pt has been having increasing pain in her legs and was last seen in July with plan on recheck in 3 months. Pt was told by her neurologist and was told her symptoms are not likely due to her transverse myelitis.   History of multiple medical problems, per problem list.   Patient Active Problem List   Diagnosis Date Noted  . Muscle spasms of both lower extremities 04/17/2016  . Gait disturbance 01/25/2016  . Transverse myelitis ([place]) 12/14/2015  . Acute-on-chronic kidney injury ([place]) 11/12/2015  . Neck pain 11/12/2015  . Hypokalemia 11/12/2015  . Abnormal MRI, neck 11/12/2015  . Numbness  11/12/2015  . Hypertension   . Hyperlipidemia   . CAD in native artery   . Facial numbness   . Right arm numbness   . Throat pain 11/09/2015  . Drooling 11/09/2015  . CAD (coronary artery disease), native coronary artery 09/15/2014  . Headache 09/15/2014  . UTI (urinary tract infection): Probable 09/14/2014  . Candida infection of genital region 09/14/2014  . Unstable angina ([place]) 09/13/2014  . HLD (hyperlipidemia) 09/13/2014  . Otitis 09/13/2014  . ACS (acute coronary syndrome) ([place]) 09/13/2014  . Tobacco abuse 09/13/2014  . Essential hypertension 09/09/2014   Past Medical History:  Diagnosis Date  . ACS (acute coronary syndrome) ([place]) 09/13/2014   Unstable anginal pain s/p 2.25 x 12 mm Promus Premier DES to the RCA  . CAD in native artery   . Chronic kidney disease    she sthinks has stage 2 CKD, has had US doppler and has some stenosis , this was done in [place]   . CKD (chronic kidney disease), stage II   . Hyperlipidemia   . Hypertension    > 5 years  . Tobacco abuse 09/13/2014   Past Surgical History:  Procedure Laterality Date  . CORONARY ANGIOPLASTY WITH STENT PLACEMENT     L main OK, LAD OK, CFX 30%, RCA 90%-0% w/ 2.25 x 12 mm Promus Premier DES, EF 55%  . HERNIA REPAIR    . LEFT HEART CATHETERIZATION WITH  CORONARY ANGIOGRAM N/A 09/14/2014   Procedure: LEFT HEART CATHETERIZATION WITH CORONARY ANGIOGRAM;  Surgeon: Burnell Blanks, MD;  Location: [facility] CATH LAB;  Service: Cardiovascular;  Laterality: N/A;  . PERCUTANEOUS CORONARY STENT INTERVENTION (PCI-S)  09/14/2014   Procedure: PERCUTANEOUS CORONARY STENT INTERVENTION (PCI-S);  Surgeon: Burnell Blanks, MD;  Location: [facility] CATH LAB;  Service: Cardiovascular;;  . TONSILLECTOMY  2005   10 years ago   No Known Allergies Prior to Admission medications   Medication Sig Start Date End Date Taking? Authorizing Provider  aspirin 81 MG chewable tablet Chew 1 tablet (81 mg total) by mouth daily. 09/16/14  Yes  Nita Sells, MD  atorvastatin (LIPITOR) 80 MG tablet Take 1 tablet (80 mg total) by mouth daily. 05/31/16  Yes Dorian Heckle English, PA  baclofen (LIORESAL) 10 MG tablet Take one po in AM, one po in PM and two po qHS 04/17/16  Yes Britt Bottom, MD  fluticasone (FLONASE) 50 MCG/ACT nasal spray Place 2 sprays into both nostrils daily. 11/07/15  Yes Wardell Honour, MD  gabapentin (NEURONTIN) 300 MG capsule Take 1 capsule (300 mg total) by mouth 4 (four) times daily. 12/21/15  Yes Britt Bottom, MD  metoprolol (LOPRESSOR) 50 MG tablet TAKE 1 TABLET BY MOUTH TWICE DAILY. NEED OFFICE VISIT FOR FURTHER REFILLS 05/31/16  Yes Dorian Heckle English, PA  traMADol (ULTRAM) 50 MG tablet Take 1 tablet (50 mg total) by mouth 4 (four) times daily as needed. 04/17/16  Yes Britt Bottom, MD  triamterene-hydrochlorothiazide (DYAZIDE) 37.5-25 MG capsule Take 1 each (1 capsule total) by mouth daily. 05/31/16  Yes Joretta Bachelor, PA   Social History   Social History  . Marital status: Single    Spouse name: N/A  . Number of children: 0  . Years of education: masters   Occupational History  .      [employer]   Social History Main Topics  . Smoking status: Former Smoker    Packs/day: 0.50    Types: Cigarettes  . Smokeless tobacco: Never Used     Comment: on patch since 09/09/14  . Alcohol use No  . Drug use: No  . Sexual activity: Not on file   Other Topics Concern  . Not on file   Social History Narrative   Consumes 2 cups of caffeine daily      Review of Systems  Constitutional: Negative for fever.  Cardiovascular: Positive for leg swelling.  Musculoskeletal: Positive for joint swelling.  Skin:       Pt is positive for increased warmth       Objective:   Physical Exam  Constitutional: She is oriented to person, place, and time. She appears well-developed and well-nourished. No distress.  HENT:  Head: Normocephalic and atraumatic.  Right Ear: Hearing, tympanic membrane,  external ear and ear canal normal.  Left Ear: Hearing, tympanic membrane, external ear and ear canal normal.  Eyes: Conjunctivae are normal. Pupils are equal, round, and reactive to light.  Neck: Neck supple.  Cardiovascular: Normal rate, regular rhythm and normal heart sounds.  Exam reveals no gallop and no friction rub.   No murmur heard. Pulses:      Dorsalis pedis pulses are 2+ on the right side, and 2+ on the left side.  Pulmonary/Chest: Effort normal. No accessory muscle usage. No respiratory distress. She has no decreased breath sounds. She has no wheezes. She has no rhonchi. She has no rales.  Musculoskeletal: Normal range of motion.  No appreciable edema to lower extremities bilat.  Full ROM left knee, rt knee, and bilat ankles. Knees and ankles are non-tender bilat.   Neurological: She is alert and oriented to person, place, and time. She has normal strength.  Equal upper and lower extremity strength including grip strength.   Skin: Skin is warm and dry.  Skin is intact and there is no rash over left knee.  Psychiatric: She has a normal mood and affect. Her behavior is normal.  Nursing note and vitals reviewed.   Vitals:   07/03/16 1155  BP: 120/72  Pulse: 78  Resp: 18  Temp: 98.4 F (36.9 C)  TempSrc: Oral  SpO2: 98%  Weight: 150 lb (68 kg)  Height: 5' 2.5" (1.588 m)   Wt Readings from Last 3 Encounters:  07/03/16 150 lb (68 kg)  04/17/16 156 lb 8 oz (71 kg)  02/22/16 156 lb 6.4 oz (70.9 kg)         Assessment & Plan:  Anna Henson is a 45 y.o. female Pedal edema - Plan: CBC with Differential/Platelet, Basic metabolic panel, TSH  Dysesthesia - Plan: CBC with Differential/Platelet, Basic metabolic panel, TSH  Transverse myelitis ([place]) - Plan: CBC with Differential/Platelet, Basic metabolic panel, TSH  Foot swelling - Plan: CBC with Differential/Platelet, Basic metabolic panel, TSH  Your transverse myelitis with reported right-sided symptoms previously.  Now with episodic lower extremity edema and foot swelling bilaterally. No calf swelling or calf pain, no significant edema seen on exam and office, but in review of her photos on the phone, does appear to have some significant bilateral foot edema. Symptoms episodic every few days, that resolved with elevation overnight. Lungs are clear, heart exam is reassuring.   - We'll check TSH, BMP, CBC. Consider cardiology eval with echo if symptoms persist. Handout given on pedal edema.  History of transverse myelitis now with episodic warm sensation in her left knee, electrical sensation to her right knee, and left arm muscle spasms. Does complain of some shooting pain down her right arm with squeezing on the arm as well.   -Advised her to contact her neurologist to discuss the symptoms further to decide if further workup or imaging needed.   -She did ask whether a referral to other neurologist would be possible, and I would be happy to do that, but would initially talk to her neurologist who has been managing her to this point to see if other evaluation is recommended first. RTC precautions.   No orders of the defined types were placed in this encounter.  Patient Instructions    The information below on swelling in the feet. They look good right now, so unlikely to be a heart condition. I will check some electrolytes, then follow-up with me in the next 1 week if you are still experiencing the symptoms. If any acute worsening, can return here or the emergency room.  For the warm sensation in your left knee and shock sensation in right knee, and muscle spasms in the left arm, please call your neurologist to be seen or discuss other evaluation for the symptoms. If you still would like to meet with a different neurologist, let me know, but I would at least call your current neurologist to make sure they are aware of the symptoms to determine if an office visit or other evaluation is needed.  Peripheral  Edema You have swelling in your legs (peripheral edema). This swelling is due to excess accumulation of salt and water in your  body. Edema may be a sign of heart, kidney or liver disease, or a side effect of a medication. It may also be due to problems in the leg veins. Elevating your legs and using special support stockings may be very helpful, if the cause of the swelling is due to poor venous circulation. Avoid long periods of standing, whatever the cause. Treatment of edema depends on identifying the cause. Chips, pretzels, pickles and other salty foods should be avoided. Restricting salt in your diet is almost always needed. Water pills (diuretics) are often used to remove the excess salt and water from your body via urine. These medicines prevent the kidney from reabsorbing sodium. This increases urine flow. Diuretic treatment may also result in lowering of potassium levels in your body. Potassium supplements may be needed if you have to use diuretics daily. Daily weights can help you keep track of your progress in clearing your edema. You should call your caregiver for follow up care as recommended. SEEK IMMEDIATE MEDICAL CARE IF:   You have increased swelling, pain, redness, or heat in your legs.  You develop shortness of breath, especially when lying down.  You develop chest or abdominal pain, weakness, or fainting.  You have a fever.   This information is not intended to replace advice given to you by your health care provider. Make sure you discuss any questions you have with your health care provider.   Document Released: 11/02/2004 Document Revised: 12/18/2011 Document Reviewed: 04/07/2015 Elsevier Interactive Patient Education 2016 [employer].   IF you received an x-ray today, you will receive an invoice from [facility]. Please contact [facility] at [phone] with questions or concerns regarding your invoice.   IF you received labwork today, you will  receive an invoice from [employer]. Please contact [facility] at [phone] with questions or concerns regarding your invoice.   Our billing staff will not be able to assist you with questions regarding bills from these companies.  You will be contacted with the lab results as soon as they are available. The fastest way to get your results is to activate your My Chart account. Instructions are located on the last page of this paperwork. If you have not heard from [place] regarding the results in 2 weeks, please contact this office.       I personally performed the services described in this documentation, which was scribed in my presence. The recorded information has been reviewed and considered, and addended by me as needed.   Signed,   Merri Ray, MD [facility].  07/03/16 2:00 PM

## 2016-07-03 NOTE — Telephone Encounter (Signed)
I have spoken with Anna Henson this morning.  She c/o edema bilat feet, sts. unable to even wear flipflops.  Sts. edema improves with elevating feet, worse when feet are down.  Feels left leg is hot.  I have advised she contact pcp for eval.  She verbalized understanding of same/fim

## 2016-07-03 NOTE — Patient Instructions (Addendum)
  The information below on swelling in the feet. They look good right now, so unlikely to be a heart condition. I will check some electrolytes, then follow-up with me in the next 1 week if you are still experiencing the symptoms. If any acute worsening, can return here or the emergency room.  For the warm sensation in your left knee and shock sensation in right knee, and muscle spasms in the left arm, please call your neurologist to be seen or discuss other evaluation for the symptoms. If you still would like to meet with a different neurologist, let me know, but I would at least call your current neurologist to make sure they are aware of the symptoms to determine if an office visit or other evaluation is needed.  Peripheral Edema You have swelling in your legs (peripheral edema). This swelling is due to excess accumulation of salt and water in your body. Edema may be a sign of heart, kidney or liver disease, or a side effect of a medication. It may also be due to problems in the leg veins. Elevating your legs and using special support stockings may be very helpful, if the cause of the swelling is due to poor venous circulation. Avoid long periods of standing, whatever the cause. Treatment of edema depends on identifying the cause. Chips, pretzels, pickles and other salty foods should be avoided. Restricting salt in your diet is almost always needed. Water pills (diuretics) are often used to remove the excess salt and water from your body via urine. These medicines prevent the kidney from reabsorbing sodium. This increases urine flow. Diuretic treatment may also result in lowering of potassium levels in your body. Potassium supplements may be needed if you have to use diuretics daily. Daily weights can help you keep track of your progress in clearing your edema. You should call your caregiver for follow up care as recommended. SEEK IMMEDIATE MEDICAL CARE IF:   You have increased swelling, pain, redness,  or heat in your legs.  You develop shortness of breath, especially when lying down.  You develop chest or abdominal pain, weakness, or fainting.  You have a fever.   This information is not intended to replace advice given to you by your health care provider. Make sure you discuss any questions you have with your health care provider.   Document Released: 11/02/2004 Document Revised: 12/18/2011 Document Reviewed: 04/07/2015 Elsevier Interactive Patient Education 2016 [employer].   IF you received an x-ray today, you will receive an invoice from [facility]. Please contact [facility] at [phone] with questions or concerns regarding your invoice.   IF you received labwork today, you will receive an invoice from [employer]. Please contact [facility] at [phone] with questions or concerns regarding your invoice.   Our billing staff will not be able to assist you with questions regarding bills from these companies.  You will be contacted with the lab results as soon as they are available. The fastest way to get your results is to activate your My Chart account. Instructions are located on the last page of this paperwork. If you have not heard from [place] regarding the results in 2 weeks, please contact this office.

## 2016-07-03 NOTE — Telephone Encounter (Signed)
Patient called to advise, although she is taking all of her medications, states she is now having new symptoms on her left side, heat from leg, knee area, not constant, feet are so swollen, can barely get shoe on, calluses on feet, wears flip flops or slippers, muscle spasms upper left arm.

## 2016-08-21 ENCOUNTER — Encounter: Payer: Self-pay | Admitting: Neurology

## 2016-08-21 ENCOUNTER — Ambulatory Visit (INDEPENDENT_AMBULATORY_CARE_PROVIDER_SITE_OTHER): Payer: Federal, State, Local not specified - PPO | Admitting: Neurology

## 2016-08-21 VITALS — BP 160/84 | HR 68 | Resp 14 | Ht 62.5 in | Wt 150.5 lb

## 2016-08-21 DIAGNOSIS — M62838 Other muscle spasm: Secondary | ICD-10-CM | POA: Diagnosis not present

## 2016-08-21 DIAGNOSIS — R269 Unspecified abnormalities of gait and mobility: Secondary | ICD-10-CM | POA: Diagnosis not present

## 2016-08-21 DIAGNOSIS — R200 Anesthesia of skin: Secondary | ICD-10-CM

## 2016-08-21 DIAGNOSIS — G373 Acute transverse myelitis in demyelinating disease of central nervous system: Secondary | ICD-10-CM | POA: Diagnosis not present

## 2016-08-21 MED ORDER — TRAMADOL HCL 50 MG PO TABS: 50.0000 mg | ORAL_TABLET | Freq: Four times a day (QID) | ORAL | 4 refills | Status: DC | PRN

## 2016-08-21 MED ORDER — GABAPENTIN 300 MG PO CAPS: ORAL_CAPSULE | ORAL | 11 refills | Status: DC

## 2016-08-21 NOTE — Progress Notes (Signed)
[facility]  PATIENT: Anna Henson DOB: 1971/06/12  REFERRING DOCTOR OR PCP:  Rikki Spearing _________________________________   HISTORICAL  CHIEF COMPLAINT:  Chief Complaint  Patient presents with  . Transverse Myelitis    Sts. she has had more muscle spasms in her left arm, warm sensation left knee.  Sts. continues to have electiric jolt sensation right knee and edema bilat. feet/lower legs/fim    HISTORY OF PRESENT ILLNESS:  Anna Henson is a 45 yo woman with a transverse myelitis in late January 2017.      Symptoms improved some.   In April, she reported more numbness and repeat imaging was performed.  I reviewed the MRI's from 02/02/16.   The cervical spine shows resolution of the prior enhancing focus that was seen at the cervicomedullary junction      She has a creepy crawly dysesthetic sensation on the right and sometimes gets a warm sensation on the left.    She sometimes has a spasm sensation in the left arm.   She gets a Geographical information systems officer Horse sensation in her legs.    She remains on gabapentin 300 mg tid and rarely takes tramadol as she does not tolerate it as well.    She also reports dizziness and headache frequently.    The L[place] sign sensations still occur though they are not as bad as they were in the past.     She feels balance is poor. She feels gait is still better than earlier in the year.    The right leg seems a little clumsy.    Her right arm is a little clumsy and her handwriting is worse than before the transverse myelitis.   She feels strength is mildly reduced in the right arm.     She notes mild urinary frequency but no incontinence.     Labwork and the lupus anticoagulant and antiphospholipid tests were normal.  Transverse myelitis history:    In late January,  she had a right sided headache and went to bed.  When she woke up, she noted numbness in the right neck, shoulder and arm.   Numbness worsened over the next fe days.   She went to her  PCP an was told she might have sinusitis and Amoxicillin was prescribed.   After a couple more days, she saw another doctor and had a neck soft tissue CT scan.   That CT scan showed that she had an increase of adenoid tissue and reactive type lymph nodes.  By that time, numbness extended to the fingertips on the riht but not into her leg.   She was told to go to the emergency room for further evaluation. In the emergency room she had an MRI of the cervical spine and the brain. The MRI of the cervical spine showed a focus that extended from the lower medulla on the right down to C5 posteriorly and more medially within the spinal cord. She also had some enhancement around the level of the cervical medullary junction. She was admitted to the hospital for further treatment and testing.    She was treated with 5 days of IV Solu-Medrol. During that time, her symptoms of numbness improved.  She has not returned to baseline.    She had many tests performed including lumbar puncture for CSF analysis and a thoracic spine MRI and serology blood tests. For the most part, the evaluation was negative.  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 The enhancement does not extend up to the medulla.  Cervical spine also showed mild spinal stenosis at C3-C4 and mild to moderate spinal stenosis at C5-C6 and mild spinal stenosis at C6-C7. There is some foraminal narrowing at those levels but no definite nerve root compression.  Elsewhere the brain, there were some subcortical and deep white matter T2/FLAIR hyperintense foci but not in a pattern that would be typical for MS. The thoracic spine was normal.    I also reviewed her many laboratory tests performed while she was at [employer]. Neuromyelitis optica antibodies are negative. She did not have oligoclonal bands. ANCA, ANA, SSA/SSB, ACE and Vit B-12 were all normal or negative.  She had Epstein-Barr IgG but not IgM. CSF cell  counts did not show increase in white blood cells.  REVIEW OF SYSTEMS: Constitutional: No fevers, chills, sweats, or change in appetite.  She notes some fatigue Eyes: No visual changes, double vision, eye pain Ear, nose and throat: No hearing loss, ear pain, nasal congestion, sore throat Cardiovascular: No chest pain, palpitations.   H/O angina, CAD with stent Respiratory: No shortness of breath at rest or with exertion.   No wheezes GastrointestinaI: No nausea, vomiting, diarrhea, abdominal pain, fecal incontinence Genitourinary: Mild urgency and frequency.  No nocturia. Musculoskeletal: No neck pain, back pain Integumentary: No rash, pruritus, skin lesions Neurological: as above Psychiatric: No depression at this time.  No anxiety Endocrine: No palpitations, diaphoresis, change in appetite, change in weigh or increased thirst Hematologic/Lymphatic: No anemia, purpura, petechiae. Allergic/Immunologic: No itchy/runny eyes, nasal congestion, recent allergic reactions, rashes  ALLERGIES: No Known Allergies  HOME MEDICATIONS:  Current Outpatient Prescriptions:  .  aspirin 81 MG chewable tablet, Chew 1 tablet (81 mg total) by mouth daily., Disp: , Rfl:  .  atorvastatin (LIPITOR) 80 MG tablet, Take 1 tablet (80 mg total) by mouth daily., Disp: 90 tablet, Rfl: 0 .  baclofen (LIORESAL) 10 MG tablet, Take one po in AM, one po in PM and two po qHS, Disp: 120 each, Rfl: 11 .  fluticasone (FLONASE) 50 MCG/ACT nasal spray, Place 2 sprays into both nostrils daily., Disp: 16 g, Rfl: 12 .  gabapentin (NEURONTIN) 300 MG capsule, Take one pill in the morning, two in the afternoon and two po at bedtime, Disp: 150 capsule, Rfl: 11 .  metoprolol (LOPRESSOR) 50 MG tablet, TAKE 1 TABLET BY MOUTH TWICE DAILY. NEED OFFICE VISIT FOR FURTHER REFILLS, Disp: 90 tablet, Rfl: 3 .  traMADol (ULTRAM) 50 MG tablet, Take 1 tablet (50 mg total) by mouth 4 (four) times daily as needed., Disp: 120 tablet, Rfl: 4 .   triamterene-hydrochlorothiazide (DYAZIDE) 37.5-25 MG capsule, Take 1 each (1 capsule total) by mouth daily., Disp: 90 capsule, Rfl: 3 No current facility-administered medications for this visit.   Facility-Administered Medications Ordered in Other Visits:  .  gadopentetate dimeglumine (MAGNEVIST) injection 16 mL, 16 mL, Intravenous, Once PRN, Britt Bottom, MD  PAST MEDICAL HISTORY: Past Medical History:  Diagnosis Date  . ACS (acute coronary syndrome) ([place]) 09/13/2014   Unstable anginal pain s/p 2.25 x 12 mm Promus Premier DES to the RCA  . CAD in native artery   . Chronic kidney disease    she sthinks has stage 2 CKD, has had US doppler and has some stenosis , this was done in [place]   . CKD (chronic kidney disease), stage II   . Hyperlipidemia   . Hypertension    >  5 years  . Tobacco abuse 09/13/2014    PAST SURGICAL HISTORY: Past Surgical History:  Procedure Laterality Date  . CORONARY ANGIOPLASTY WITH STENT PLACEMENT     L main OK, LAD OK, CFX 30%, RCA 90%-0% w/ 2.25 x 12 mm Promus Premier DES, EF 55%  . HERNIA REPAIR    . LEFT HEART CATHETERIZATION WITH CORONARY ANGIOGRAM N/A 09/14/2014   Procedure: LEFT HEART CATHETERIZATION WITH CORONARY ANGIOGRAM;  Surgeon: Burnell Blanks, MD;  Location: [facility] CATH LAB;  Service: Cardiovascular;  Laterality: N/A;  . PERCUTANEOUS CORONARY STENT INTERVENTION (PCI-S)  09/14/2014   Procedure: PERCUTANEOUS CORONARY STENT INTERVENTION (PCI-S);  Surgeon: Burnell Blanks, MD;  Location: [facility] CATH LAB;  Service: Cardiovascular;;  . TONSILLECTOMY  2005   10 years ago    FAMILY HISTORY: Family History  Problem Relation Age of Onset  . Diabetes Mother   . Heart disease Mother   . Hyperlipidemia Mother   . Hypertension Mother   . Lung cancer Father     SOCIAL HISTORY:  Social History   Social History  . Marital status: Single    Spouse name: N/A  . Number of children: 0  . Years of education: masters   Occupational  History  .      [employer]   Social History Main Topics  . Smoking status: Former Smoker    Packs/day: 0.50    Types: Cigarettes  . Smokeless tobacco: Never Used     Comment: on patch since 09/09/14  . Alcohol use No  . Drug use: No  . Sexual activity: Not on file   Other Topics Concern  . Not on file   Social History Narrative   Consumes 2 cups of caffeine daily     PHYSICAL EXAM  Vitals:   08/21/16 1302  BP: (!) 160/84  Pulse: 68  Resp: 14  Weight: 150 lb 8 oz (68.3 kg)  Height: 5' 2.5" (1.588 m)    Body mass index is 27.09 kg/m.   General: The patient is well-developed and well-nourished and in no acute distress  Neurologic Exam  Mental status: The patient is alert and oriented x 3 at the time of the examination. The patient has apparent normal recent and remote memory, with an apparently normal attention span and concentration ability.   Speech is normal.  Cranial nerves: Extraocular movements are full.  There is good facial sensation to soft touch bilaterally.Facial strength is normal.  Trapezius and sternocleidomastoid strength is normal. No dysarthria is noted.  The tongue is midline, and the patient has symmetric elevation of the soft palate. No obvious hearing deficits are noted.  Motor:  Muscle bulk is normal.   Tone is slightly increased in right leg. Strength is  5 / 5 in all 4 extremities.   Sensory: She reports hyperpathia to vibration in the right leg but touch is more symmetric now.     Coordination: Cerebellar testing reveals good finger-nose-finger and heel-to-shin bilaterally.  Gait and station: Station is normal.   Gait is slightly wide and slightly spastic (right). Tandem gait is mildly wide. Romberg is negative.   Reflexes: Deep tendon reflexes are symmetric and normal bilaterally.        DIAGNOSTIC DATA (LABS, IMAGING, TESTING) - I reviewed patient records, labs, notes, testing and imaging myself where available.  Lab Results    Component Value Date   WBC 9.9 07/03/2016   HGB 13.2 07/03/2016   HCT 38.2 07/03/2016   MCV 91.4  07/03/2016   PLT 409 (H) 07/03/2016      Component Value Date/Time   NA 136 07/03/2016 1354   K 3.5 07/03/2016 1354   CL 104 07/03/2016 1354   CO2 21 07/03/2016 1354   GLUCOSE 91 07/03/2016 1354   BUN 13 07/03/2016 1354   CREATININE 1.12 (H) 07/03/2016 1354   CALCIUM 9.6 07/03/2016 1354   PROT 6.1 11/24/2015 1113   ALBUMIN 3.5 (L) 11/24/2015 1113   AST 27 11/24/2015 1113   ALT 39 (H) 11/24/2015 1113   ALKPHOS 51 11/24/2015 1113   BILITOT 0.3 11/24/2015 1113   GFRNONAA 56 (L) 11/24/2015 1113   GFRAA 65 11/24/2015 1113   Lab Results  Component Value Date   CHOL 128 10/25/2015   HDL 33 (L) 10/25/2015   LDLCALC 76 10/25/2015   TRIG 97 10/25/2015   CHOLHDL 3.9 10/25/2015   Lab Results  Component Value Date   HGBA1C 5.8 02/22/2016   Lab Results  Component Value Date   VITAMINB12 541 11/12/2015   Lab Results  Component Value Date   TSH 0.82 07/03/2016       ASSESSMENT AND PLAN  Transverse myelitis (HCC)  Gait disturbance  Numbness  Muscle spasms of both lower extremities    1.  Tramadol prn for pain up to 4/day. 2.   Increase gabapentin to 300 - 600 - 600.     If not better consider lamotrigine 3.   Continue to be active, exercise 4.  Baclofen for spasms prn 5.  She will return to see me in 12 months or sooner if there are new or worsening neurologic symptoms.   Richard A. Felecia Shelling, MD, PhD [id number], [id number] PM Certified in Neurology, Clinical Neurophysiology, Sleep Medicine, Pain Medicine and Neuroimaging  [facility] [address] [place], [place] [address] [phone]

## 2016-08-22 ENCOUNTER — Other Ambulatory Visit: Payer: Self-pay | Admitting: Physician Assistant

## 2016-08-22 DIAGNOSIS — E785 Hyperlipidemia, unspecified: Secondary | ICD-10-CM

## 2016-08-25 ENCOUNTER — Other Ambulatory Visit: Payer: Self-pay | Admitting: Family Medicine

## 2016-08-25 DIAGNOSIS — E785 Hyperlipidemia, unspecified: Secondary | ICD-10-CM

## 2016-09-01 ENCOUNTER — Encounter: Payer: Self-pay | Admitting: Neurology

## 2016-09-05 ENCOUNTER — Encounter: Payer: Self-pay | Admitting: *Deleted

## 2016-09-05 ENCOUNTER — Telehealth: Payer: Self-pay | Admitting: Neurology

## 2016-09-05 NOTE — Telephone Encounter (Signed)
I have spoken with pt. this afternoon.  She sts. 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 to pick up/fim

## 2016-09-05 NOTE — Telephone Encounter (Signed)
Pt called wanting to know if Dr Felecia Shelling rec'd her email. I advised her it came thru, I would send a msg to RN to follow up. She was appreciative

## 2016-10-16 ENCOUNTER — Telehealth: Payer: Self-pay | Admitting: Neurology

## 2016-10-16 NOTE — Telephone Encounter (Signed)
I have spoken with Tonya this morning.  Handicap placard application mailed to her home address/fim

## 2016-10-16 NOTE — Telephone Encounter (Signed)
Patient called stating her handicapped placard stating it expires end of January and is wanting to see if she can get another one filled out. Please call

## 2016-10-19 ENCOUNTER — Telehealth: Payer: Self-pay | Admitting: Internal Medicine

## 2016-10-19 NOTE — Telephone Encounter (Signed)
Patient reporting chest discomfort on and off x several weeks. Notes she is not immediately concerned, but hasn't seen Dr. Debara Pickett in a while and wanted to reestablish w him. She has appt w [facility] on 1/25 and appt w B Strader tomorrow. Has assured me that she is not having any symptoms currently, and that her chest discomfort is generally mild and self resolving. Her only other symptom is a "slightly elevated blood pressure" (140s/70s) she's noted over the past couple weeks. She is aware to go to ED if her symptoms get worse/non-resolving, or if she has new associated symptoms. O/w, follow up tomorrow. Voiced understanding, and thanks for call.

## 2016-10-19 NOTE — Telephone Encounter (Signed)
Pt having chest pains off and on,not at the present. I made her an apt tomorrow with Bernerd Pho our PA. I wanted pt to talk to nurse to make sure she did not need to be seen sooner.

## 2016-10-20 ENCOUNTER — Ambulatory Visit (INDEPENDENT_AMBULATORY_CARE_PROVIDER_SITE_OTHER): Payer: Federal, State, Local not specified - PPO | Admitting: Student

## 2016-10-20 ENCOUNTER — Other Ambulatory Visit: Payer: Self-pay | Admitting: Student

## 2016-10-20 ENCOUNTER — Encounter: Payer: Self-pay | Admitting: Student

## 2016-10-20 VITALS — BP 124/78 | HR 60 | Ht 62.0 in | Wt 146.0 lb

## 2016-10-20 DIAGNOSIS — R079 Chest pain, unspecified: Secondary | ICD-10-CM

## 2016-10-20 DIAGNOSIS — I10 Essential (primary) hypertension: Secondary | ICD-10-CM

## 2016-10-20 DIAGNOSIS — E785 Hyperlipidemia, unspecified: Secondary | ICD-10-CM | POA: Diagnosis not present

## 2016-10-20 DIAGNOSIS — I25110 Atherosclerotic heart disease of native coronary artery with unstable angina pectoris: Secondary | ICD-10-CM

## 2016-10-20 MED ORDER — NITROGLYCERIN 0.4 MG SL SUBL: 0.4000 mg | SUBLINGUAL_TABLET | SUBLINGUAL | 1 refills | Status: DC | PRN

## 2016-10-20 NOTE — Patient Instructions (Signed)
Medication Instructions:  Use nitro as needed for chest pain  If you need a refill on your cardiac medications before your next appointment, please call your pharmacy.  Labwork: none  Testing/Procedures: Your physician has requested that you have a lexiscan myoview. For further information please visit [email or web address]. Please follow instruction sheet, as given.  DO NOT TAKE METOPROLOL THE MORNING OF THIS PROCEDURE.  Follow-Up: Your physician recommends that you schedule a follow-up appointment in: February TO DISCUSS LEXISCAN-MYOVIEW   Special Instructions:  DO NOT TAKE METOPROLOL THE MORNING OF THIS PROCEDURE.    Thank you for choosing [facility] at [employer], LPN Bernerd Pho, PA-C

## 2016-10-20 NOTE — Progress Notes (Signed)
Cardiology Office Note    Date:  10/20/2016   ID:  [name], DOB [birth year], MRN [id number]  PCP:  Leotis Pain, DO  Cardiologist: Dr. Debara Pickett   Chief Complaint  Patient presents with  . Chest Pain    History of Present Illness:    Anna Henson is a 46 y.o. female with past medical history of CAD (s/p DES to RCA in 09/2014), HTN, HLD, Stage 3 CKD, and tobacco use who presents to the office today for evaluation of chest pain.   She was last seen in the office by Dr. Debara Pickett in 10/2015. She denied any episodes of chest pain or dyspnea with exertion at that time. Did report having a recent viral URI and associated numbness of her face and right arm. Had been seen in the ER with labs showing no acute abnormalities. CT Imaging showed narrowing of the cervical spine. Was actually admitted to the hospital during this time frame for recurrent numbness with LP showing no acute findings. Was treated with IV Solu-Medrol with her symptoms resolving. She has continued to be followed by Neurology as an outpatient and has been diagnosed with transverse myelitis.   In talking with the patient today, she reports having episodes of chest discomfort for the past month. Reports these episodes mostly occur in the evening hours and can present at rest or with exertion. The episodes last for approximately 30 minutes and then resolve spontaneously. Have increased in frequency, occurring 2-3 days per week. She has noted having pain when she feels very agitated.   She denies any associated dyspnea with exertion, palpitations, nausea, vomiting, or radiating pain. Certain qualities of her discomfort resemble symptoms she experienced when she required stent placement such as the pain occasionally radiating to her left shoulder. However, the symptoms are not nearly as severe as what she experienced in 2015.   Past Medical History:  Diagnosis Date  . ACS (acute coronary syndrome) ([place]) 09/13/2014   Unstable  anginal pain s/p 2.25 x 12 mm Promus Premier DES to the RCA  . CAD in native artery   . Chronic kidney disease    she sthinks has stage 2 CKD, has had US doppler and has some stenosis , this was done in [place]   . CKD (chronic kidney disease), stage II   . Hyperlipidemia   . Hypertension    > 5 years  . Tobacco abuse 09/13/2014    Past Surgical History:  Procedure Laterality Date  . CORONARY ANGIOPLASTY WITH STENT PLACEMENT     L main OK, LAD OK, CFX 30%, RCA 90%-0% w/ 2.25 x 12 mm Promus Premier DES, EF 55%  . HERNIA REPAIR    . LEFT HEART CATHETERIZATION WITH CORONARY ANGIOGRAM N/A 09/14/2014   Procedure: LEFT HEART CATHETERIZATION WITH CORONARY ANGIOGRAM;  Surgeon: Burnell Blanks, MD;  Location: [facility] CATH LAB;  Service: Cardiovascular;  Laterality: N/A;  . PERCUTANEOUS CORONARY STENT INTERVENTION (PCI-S)  09/14/2014   Procedure: PERCUTANEOUS CORONARY STENT INTERVENTION (PCI-S);  Surgeon: Burnell Blanks, MD;  Location: [facility] CATH LAB;  Service: Cardiovascular;;  . TONSILLECTOMY  2005   10 years ago    Current Medications: Outpatient Medications Prior to Visit  Medication Sig Dispense Refill  . aspirin 81 MG chewable tablet Chew 1 tablet (81 mg total) by mouth daily.    Marland Kitchen atorvastatin (LIPITOR) 80 MG tablet TAKE 1 TABLET BY MOUTH EVERY DAY 30 tablet 0  . baclofen (LIORESAL) 10 MG tablet Take one po  in AM, one po in PM and two po qHS 120 each 11  . fluticasone (FLONASE) 50 MCG/ACT nasal spray Place 2 sprays into both nostrils daily. 16 g 12  . gabapentin (NEURONTIN) 300 MG capsule Take one pill in the morning, two in the afternoon and two po at bedtime 150 capsule 11  . metoprolol (LOPRESSOR) 50 MG tablet TAKE 1 TABLET BY MOUTH TWICE DAILY. NEED OFFICE VISIT FOR FURTHER REFILLS 90 tablet 3  . traMADol (ULTRAM) 50 MG tablet Take 1 tablet (50 mg total) by mouth 4 (four) times daily as needed. 120 tablet 4  . triamterene-hydrochlorothiazide (DYAZIDE) 37.5-25 MG capsule Take  1 each (1 capsule total) by mouth daily. 90 capsule 3  . atorvastatin (LIPITOR) 80 MG tablet Take 1 tablet (80 mg total) by mouth daily. 90 tablet 0   Facility-Administered Medications Prior to Visit  Medication Dose Route Frequency Provider Last Rate Last Dose  . gadopentetate dimeglumine (MAGNEVIST) injection 16 mL  16 mL Intravenous Once PRN Britt Bottom, MD         Allergies:   Patient has no known allergies.   Social History   Social History  . Marital status: Single    Spouse name: N/A  . Number of children: 0  . Years of education: masters   Occupational History  .      [employer]   Social History Main Topics  . Smoking status: Former Smoker    Packs/day: 0.50    Types: Cigarettes  . Smokeless tobacco: Never Used     Comment: on patch since 09/09/14  . Alcohol use No  . Drug use: No  . Sexual activity: Not Asked   Other Topics Concern  . None   Social History Narrative   Consumes 2 cups of caffeine daily     Family History:  The patient's family history includes Diabetes in her mother; Heart disease in her mother; Hyperlipidemia in her mother; Hypertension in her mother; Lung cancer in her father.   Review of Systems:   Please see the history of present illness.     General:  No chills, fever, night sweats or weight changes.  Cardiovascular:  No dyspnea on exertion, edema, orthopnea, palpitations, paroxysmal nocturnal dyspnea. Positive for chest pain.  Dermatological: No rash, lesions/masses Respiratory: No cough, dyspnea Urologic: No hematuria, dysuria Abdominal:   No nausea, vomiting, diarrhea, bright red blood per rectum, melena, or hematemesis Neurologic:  No visual changes, wkns, changes in mental status. All other systems reviewed and are otherwise negative except as noted above.   Physical Exam:    VS:  BP 124/78   Pulse 60   Ht 5\' 2"  (1.575 m)   Wt 146 lb (66.2 kg)   BMI 26.70 kg/m    General: Well developed, well nourished [place]  American female appearing in no acute distress. Head: Normocephalic, atraumatic, sclera non-icteric, no xanthomas, nares are without discharge.  Neck: No carotid bruits. JVD not elevated.  Lungs: Respirations regular and unlabored, without wheezes or rales.  Heart: Regular rate and rhythm. No S3 or S4.  No murmur, no rubs, or gallops appreciated. Abdomen: Soft, non-tender, non-distended with normoactive bowel sounds. No hepatomegaly. No rebound/guarding. No obvious abdominal masses. Msk:  Strength and tone appear normal for age. No joint deformities or effusions. Extremities: No clubbing or cyanosis. No edema.  Distal pedal pulses are 2+ bilaterally. Neuro: Alert and oriented X 3. Moves all extremities spontaneously. No focal deficits noted. Psych:  Responds  to questions appropriately with a normal affect. Skin: No rashes or lesions noted   Wt Readings from Last 3 Encounters:  10/20/16 146 lb (66.2 kg)  08/21/16 150 lb 8 oz (68.3 kg)  07/03/16 150 lb (68 kg)     Studies/Labs Reviewed:   EKG:  EKG is ordered today.  The ekg ordered today demonstrates NSR, HR 60, with no acute ST or T-wave changes when compared to prior tracings.   Recent Labs: 11/12/2015: Magnesium 2.1 11/24/2015: ALT 39 07/03/2016: BUN 13; Creat 1.12; Hemoglobin 13.2; Platelets 409; Potassium 3.5; Sodium 136; TSH 0.82   Lipid Panel    Component Value Date/Time   CHOL 128 10/25/2015 1012   TRIG 97 10/25/2015 1012   HDL 33 (L) 10/25/2015 1012   CHOLHDL 3.9 10/25/2015 1012   VLDL 19 10/25/2015 1012   LDLCALC 76 10/25/2015 1012    Additional studies/ records that were reviewed today include:   Cardiac Catheterization:09/2014 Angiographic Findings:  Left main: No obstructive disease.  Left Anterior Descending Artery: Large caliber vessel that courses to the apex. Moderate caliber diagonal branch. No obstructive disease.   Circumflex Artery: Large caliber vessel that gives off one large obtuse marginal  branch. The mid vessel has 30% stenosis.   Right Coronary Artery: Moderate caliber dominant vessel with 30% proximal stenosis, 90% mid stenosis.   Left Ventricular Angiogram: LVEF=65-70%  Impression: 1. Unstable angina  2. 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 Successful PTCA/DES x 1 mid RCA 4. Normal LV function  Recommendations: Will continue ASA, Plavix, statin, beta blocker.        Complications:  None. The patient tolerated the procedure well.          Assessment:    1. Chest pain, unspecified type   2. Coronary artery disease involving native coronary artery of native heart with unstable angina pectoris ([place])   3. Essential hypertension   4. Hyperlipidemia LDL goal <70      Plan:   In order of problems listed above:  1. Chest Pain  -  reports having episodes of chest discomfort for the past month, occurring at rest or with exertion. The episodes last for approximately 30 minutes and then resolve spontaneously, occurring 2-3 days per week. Denies any associated dyspnea with exertion, palpitations, nausea, vomiting, or radiating pain. Pain radiates to her back at times, which is similar to prior symptoms in 2015 but not as severe. She has noticed the occasional episode occurring when she is agitated.  - will obtain a Lexiscan Myoview to assess for ischemia. Will provide Rx for SL NTG.   2. CAD - s/p DES to RCA in 09/2014 - EKG today is without acute ischemic changes. Plan for NST as above. - continue ASA, statin, and BB.  3. Essential HTN - BP well-controlled at 124/78 today. - continue current medication regimen.   4. HLD - Lipid followed by PCP. LDL at 76 in 10/2015 by review of our records. - continue Atorvastatin 80mg  daily with goal LDL < 70.   Medication Adjustments/Labs and Tests Ordered: Current medicines are reviewed at length with the patient today.  Concerns regarding medicines are outlined above.  Medication changes, Labs and Tests  ordered today are listed in the Patient Instructions below. Patient Instructions  Medication Instructions:  Use nitro as needed for chest pain  If you need a refill on your cardiac medications before your next appointment, please call your pharmacy.  Labwork: none  Testing/Procedures: Your physician has requested  that you have a lexiscan myoview. For further information please visit [email or web address]. Please follow instruction sheet, as given.  DO NOT TAKE METOPROLOL THE MORNING OF THIS PROCEDURE.  Follow-Up: Your physician recommends that you schedule a follow-up appointment in: February TO DISCUSS LEXISCAN-MYOVIEW  Special Instructions: DO NOT TAKE METOPROLOL THE MORNING OF THIS PROCEDURE.    Arna Medici, PA  10/20/2016 4:44 PM    [place], [place], [place]  [address] Phone: [phone]; Fax: [phone]  [address], [place], [place] [address] Phone: [phone]

## 2016-10-23 NOTE — Telephone Encounter (Signed)
SL NTG refilled 10/20/16 - 90 day supply of PRN medication refused

## 2016-10-24 ENCOUNTER — Telehealth (HOSPITAL_COMMUNITY): Payer: Self-pay

## 2016-10-24 NOTE — Telephone Encounter (Signed)
Encounter complete. 

## 2016-10-25 ENCOUNTER — Inpatient Hospital Stay (HOSPITAL_COMMUNITY): Admission: RE | Admit: 2016-10-25 | Payer: Federal, State, Local not specified - PPO | Source: Ambulatory Visit

## 2016-11-02 ENCOUNTER — Ambulatory Visit: Payer: Federal, State, Local not specified - PPO | Admitting: Internal Medicine

## 2016-11-09 ENCOUNTER — Telehealth (HOSPITAL_COMMUNITY): Payer: Self-pay

## 2016-11-09 NOTE — Telephone Encounter (Signed)
Encounter complete. 

## 2016-11-13 ENCOUNTER — Ambulatory Visit: Payer: Federal, State, Local not specified - PPO | Admitting: Internal Medicine

## 2016-11-14 ENCOUNTER — Ambulatory Visit (HOSPITAL_COMMUNITY)
Admission: RE | Admit: 2016-11-14 | Discharge: 2016-11-14 | Disposition: A | Discharge status: Home / Self Care | Payer: Federal, State, Local not specified - PPO | Source: Ambulatory Visit | Attending: Internal Medicine | Admitting: Internal Medicine

## 2016-11-14 DIAGNOSIS — R079 Chest pain, unspecified: Secondary | ICD-10-CM

## 2016-11-14 LAB — MYOCARDIAL PERFUSION IMAGING
LV dias vol: 87 mL (ref 46–106)
LV sys vol: 38 mL
Peak HR: 107 {beats}/min
Rest HR: 56 {beats}/min
SDS: 4
SRS: 0
SSS: 4
TID: 1.21

## 2016-11-14 MED ORDER — TECHNETIUM TC 99M TETROFOSMIN IV KIT
10.2000 | PACK | Freq: Once | INTRAVENOUS | Status: AC | PRN
  Administered 2016-11-14: 10.2 via INTRAVENOUS
  Filled 2016-11-14: qty 11

## 2016-11-14 MED ORDER — REGADENOSON 0.4 MG/5ML IV SOLN
0.4000 mg | Freq: Once | INTRAVENOUS | Status: AC
  Administered 2016-11-14: 0.4 mg via INTRAVENOUS

## 2016-11-14 MED ORDER — AMINOPHYLLINE 25 MG/ML IV SOLN
75.0000 mg | Freq: Once | INTRAVENOUS | Status: AC
  Administered 2016-11-14: 75 mg via INTRAVENOUS

## 2016-11-14 MED ORDER — TECHNETIUM TC 99M TETROFOSMIN IV KIT
31.2000 | PACK | Freq: Once | INTRAVENOUS | Status: AC | PRN
  Administered 2016-11-14: 31.2 via INTRAVENOUS
  Filled 2016-11-14: qty 32

## 2016-11-17 ENCOUNTER — Ambulatory Visit: Payer: Federal, State, Local not specified - PPO | Admitting: Internal Medicine

## 2016-11-27 ENCOUNTER — Telehealth: Payer: Self-pay | Admitting: Neurology

## 2016-11-27 DIAGNOSIS — F329 Major depressive disorder, single episode, unspecified: Secondary | ICD-10-CM

## 2016-11-27 DIAGNOSIS — F419 Anxiety disorder, unspecified: Secondary | ICD-10-CM

## 2016-11-27 DIAGNOSIS — F32A Depression, unspecified: Secondary | ICD-10-CM

## 2016-11-27 NOTE — Telephone Encounter (Signed)
Pt called back says she send a msg this morning and has not had a return call. I told her the msg was sent to RN but they have been seeing pt's, RN will call her back.

## 2016-11-27 NOTE — Telephone Encounter (Signed)
Appointment Request From: Anna Henson    With Provider: Britt Bottom, MD [[facility]]    Preferred Date Range: Any date 11/27/2016 or later    Preferred Times: Any    Reason: To address the following health maintenance concerns.    Comments:  I would like a referral to a mental health doctor. I've been having some issues with anxiety, anger, and feeling ovewhelmed. At times, my brain feels like if I have to process another thought my mind will snap. I'm hesitant to ask for help because you often say my issues aren't due to the TM. I really need to talk to someone even if you feel the conditions are unrelated. Can you please refer me to someone?

## 2016-11-27 NOTE — Telephone Encounter (Signed)
LMOM that per RAS, ok for psych referral for issues she has described, that are not likely due to transverse myelitis.  I have placed psych referral order in EPIC this afternoon./fim

## 2016-11-29 ENCOUNTER — Ambulatory Visit (INDEPENDENT_AMBULATORY_CARE_PROVIDER_SITE_OTHER): Payer: Federal, State, Local not specified - PPO | Admitting: Family Medicine

## 2016-11-29 VITALS — BP 110/60 | HR 68 | Temp 98.2°F | Resp 18 | Ht 62.0 in | Wt 144.0 lb

## 2016-11-29 DIAGNOSIS — N3000 Acute cystitis without hematuria: Secondary | ICD-10-CM | POA: Diagnosis not present

## 2016-11-29 DIAGNOSIS — R300 Dysuria: Secondary | ICD-10-CM

## 2016-11-29 LAB — POCT URINALYSIS DIP (MANUAL ENTRY)
Bilirubin, UA: NEGATIVE
Blood, UA: NEGATIVE
Glucose, UA: NEGATIVE
Ketones, POC UA: NEGATIVE
Nitrite, UA: NEGATIVE
Protein Ur, POC: NEGATIVE
Spec Grav, UA: 1.01
Urobilinogen, UA: 0.2
pH, UA: 7

## 2016-11-29 LAB — POC MICROSCOPIC URINALYSIS (UMFC): Mucus: ABSENT

## 2016-11-29 MED ORDER — FLUTICASONE PROPIONATE 50 MCG/ACT NA SUSP: 2.0000 | Freq: Every day | NASAL | 12 refills | Status: DC

## 2016-11-29 MED ORDER — FLUCONAZOLE 150 MG PO TABS: 150.0000 mg | ORAL_TABLET | Freq: Once | ORAL | 0 refills | Status: AC

## 2016-11-29 MED ORDER — METOPROLOL TARTRATE 50 MG PO TABS: ORAL_TABLET | ORAL | 1 refills | Status: DC

## 2016-11-29 MED ORDER — CEPHALEXIN 500 MG PO CAPS: 500.0000 mg | ORAL_CAPSULE | Freq: Three times a day (TID) | ORAL | 0 refills | Status: DC

## 2016-11-29 MED ORDER — PHENAZOPYRIDINE HCL 200 MG PO TABS: 200.0000 mg | ORAL_TABLET | Freq: Three times a day (TID) | ORAL | 0 refills | Status: DC | PRN

## 2016-11-29 NOTE — Progress Notes (Signed)
Anna Henson is a 46 y.o. female who presents to [facility] today with complaints concerning for UTI:  1.  Concern for UTI: Anna Henson is a 46 y.o. female who complains of urinary frequency, urgency and dysuria x 7-9  days.  She denies any flank or back pain, fever, chills, vomiting, or abnormal vaginal discharge/itching/bleeding.  Does endorse some suprapubic pressure.  No history of incontinence.    The following portions of the patient's history were reviewed and updated as appropriate: allergies, current medications, past medical history, family and social history, and problem list.  Patient is a nonsmoker.    Past Medical History:  Diagnosis Date  . ACS (acute coronary syndrome) ([place]) 09/13/2014   Unstable anginal pain s/p 2.25 x 12 mm Promus Premier DES to the RCA  . CAD in native artery   . Chronic kidney disease    she sthinks has stage 2 CKD, has had US doppler and has some stenosis , this was done in [place]   . CKD (chronic kidney disease), stage II   . Hyperlipidemia   . Hypertension    > 5 years  . Tobacco abuse 09/13/2014   Past Surgical History:  Procedure Laterality Date  . CORONARY ANGIOPLASTY WITH STENT PLACEMENT     L main OK, LAD OK, CFX 30%, RCA 90%-0% w/ 2.25 x 12 mm Promus Premier DES, EF 55%  . HERNIA REPAIR    . LEFT HEART CATHETERIZATION WITH CORONARY ANGIOGRAM N/A 09/14/2014   Procedure: LEFT HEART CATHETERIZATION WITH CORONARY ANGIOGRAM;  Surgeon: Burnell Blanks, MD;  Location: [facility] CATH LAB;  Service: Cardiovascular;  Laterality: N/A;  . PERCUTANEOUS CORONARY STENT INTERVENTION (PCI-S)  09/14/2014   Procedure: PERCUTANEOUS CORONARY STENT INTERVENTION (PCI-S);  Surgeon: Burnell Blanks, MD;  Location: [facility] CATH LAB;  Service: Cardiovascular;;  . TONSILLECTOMY  2005   10 years ago    Medications reviewed.   ROS as above otherwise neg.       Objective:   Physical Exam BP 110/60 (BP Location: Right Arm, Patient Position: Sitting, Cuff Size:  Small)   Pulse 68   Temp 98.2 F (36.8 C) (Oral)   Resp 18   Ht 5\' 2"  (1.575 m)   Wt 144 lb (65.3 kg)   LMP 11/22/2016   SpO2 100%   BMI 26.34 kg/m  Gen:  Alert, cooperative patient who appears stated age in no acute distress.  Vital signs reviewed. Mouth: MMM Cardiac:  Regular rate and rhythm without murmur auscultated.  Good S1/S2. Pulm:  Clear to auscultation bilaterally with good air movement.  No wheezes or rales noted.   Abdomen:  Soft/ND/NT.  No CVA tenderness bilaterally   Results for orders placed or performed in visit on 11/29/16 (from the past 72 hour(s))  POCT urinalysis dipstick     Status: Abnormal   Collection Time: 11/29/16  5:03 PM  Result Value Ref Range   Color, UA yellow yellow   Clarity, UA clear clear   Glucose, UA negative negative   Bilirubin, UA negative negative   Ketones, POC UA negative negative   Spec Grav, UA 1.010    Blood, UA negative negative   pH, UA 7.0    Protein Ur, POC negative negative   Urobilinogen, UA 0.2    Nitrite, UA Negative Negative   Leukocytes, UA small (1+) (A) Negative    Imp/Plan: 1.  UTI: Treatment with keflex x 7 days. Treat based on symptoms and U/A. Pyridium prn. Call or return  to clinic prn if these symptoms worsen or fail to improve as anticipated, or if she begins to have any back pain, fevers, or chills.  2.  Also needs refills for metoprolol and Flonase.  Filled today.

## 2016-11-29 NOTE — Patient Instructions (Addendum)
It does indeed look like you have a urinary tract infection.  Take the Keflex three times daily daily for the next 7 days.   Take the Pyridium up to 3 times a day for irritation.  If you start having any worsening pain, vomiting, unable to hold down fluids, fevers, or other concerns, do not hesitate to come back or go to the ED after hours.    It was good to see you today!     IF you received an x-ray today, you will receive an invoice from [facility]. Please contact [facility] at [phone] with questions or concerns regarding your invoice.   IF you received labwork today, you will receive an invoice from [facility]. Please contact [facility] at [phone] with questions or concerns regarding your invoice.   Our billing staff will not be able to assist you with questions regarding bills from these companies.  You will be contacted with the lab results as soon as they are available. The fastest way to get your results is to activate your My Chart account. Instructions are located on the last page of this paperwork. If you have not heard from [place] regarding the results in 2 weeks, please contact this office.

## 2016-11-29 NOTE — Addendum Note (Signed)
Addended byMingo Amber, Kayleen Memos on: 11/29/2016 06:06 PM   Modules accepted: Orders

## 2016-12-01 ENCOUNTER — Telehealth: Payer: Self-pay | Admitting: Family Medicine

## 2016-12-01 LAB — URINE CULTURE

## 2016-12-01 NOTE — Telephone Encounter (Signed)
Called patient to see how she's doing.  Urine culture revealed intermediate suscetibility to cephalothin.  She is on TID dosing so should achieve good MIC and treatment.    Patient was not able to pick up the prescription until today, so she hasn't started it yet.  She plans to start today.  Gave her instructions to try it for 48 hours, and if not improved, should call and let us know. I can call her a different abx at that point, which would be Cipro 500 mg BID.    Patient appreciative of call.

## 2016-12-02 ENCOUNTER — Other Ambulatory Visit: Payer: Self-pay | Admitting: Family Medicine

## 2016-12-04 NOTE — Telephone Encounter (Signed)
Patient notified via My Chart.  Meds ordered this encounter  Medications  . fluticasone (FLONASE) 50 MCG/ACT nasal spray    Sig: SHAKE LIQUID AND USE 2 SPRAYS IN EACH NOSTRIL DAILY    Dispense:  16 g    Refill:  5

## 2016-12-18 ENCOUNTER — Encounter (HOSPITAL_COMMUNITY): Payer: Self-pay | Admitting: Emergency Medicine

## 2016-12-18 ENCOUNTER — Emergency Department (HOSPITAL_COMMUNITY): Payer: Federal, State, Local not specified - PPO

## 2016-12-18 DIAGNOSIS — R079 Chest pain, unspecified: Secondary | ICD-10-CM | POA: Diagnosis present

## 2016-12-18 DIAGNOSIS — Z79899 Other long term (current) drug therapy: Secondary | ICD-10-CM | POA: Diagnosis not present

## 2016-12-18 DIAGNOSIS — Z87891 Personal history of nicotine dependence: Secondary | ICD-10-CM | POA: Diagnosis not present

## 2016-12-18 DIAGNOSIS — R072 Precordial pain: Secondary | ICD-10-CM | POA: Diagnosis not present

## 2016-12-18 DIAGNOSIS — Z955 Presence of coronary angioplasty implant and graft: Secondary | ICD-10-CM | POA: Insufficient documentation

## 2016-12-18 DIAGNOSIS — Z7982 Long term (current) use of aspirin: Secondary | ICD-10-CM | POA: Diagnosis not present

## 2016-12-18 DIAGNOSIS — I129 Hypertensive chronic kidney disease with stage 1 through stage 4 chronic kidney disease, or unspecified chronic kidney disease: Secondary | ICD-10-CM | POA: Diagnosis not present

## 2016-12-18 DIAGNOSIS — I2510 Atherosclerotic heart disease of native coronary artery without angina pectoris: Secondary | ICD-10-CM | POA: Diagnosis not present

## 2016-12-18 DIAGNOSIS — N182 Chronic kidney disease, stage 2 (mild): Secondary | ICD-10-CM | POA: Insufficient documentation

## 2016-12-18 NOTE — ED Triage Notes (Signed)
Pt presents to ER with midsternal CP that began today; pt reporting high BP x 3 days with migraines and spots in vision field; pt denies any other focal deficits at this time

## 2016-12-18 NOTE — ED Notes (Signed)
Pt states she took 4 baby ASA, 1 SL NTG, and metoprolol PTA tonight

## 2016-12-19 ENCOUNTER — Emergency Department (HOSPITAL_COMMUNITY)
Admission: EM | Admit: 2016-12-19 | Discharge: 2016-12-19 | Disposition: A | Discharge status: Home / Self Care | Payer: Federal, State, Local not specified - PPO | Attending: Emergency Medicine | Admitting: Emergency Medicine

## 2016-12-19 DIAGNOSIS — R072 Precordial pain: Secondary | ICD-10-CM

## 2016-12-19 LAB — HEPATIC FUNCTION PANEL
ALT: 19 U/L (ref 14–54)
AST: 24 U/L (ref 15–41)
Albumin: 3.6 g/dL (ref 3.5–5.0)
Alkaline Phosphatase: 49 U/L (ref 38–126)
Bilirubin, Direct: <0.1 mg/dL — ABNORMAL LOW (ref 0.1–0.5)
Total Bilirubin: 0.3 mg/dL (ref 0.3–1.2)
Total Protein: 6 g/dL — ABNORMAL LOW (ref 6.5–8.1)

## 2016-12-19 LAB — BASIC METABOLIC PANEL
Anion gap: 10 (ref 5–15)
BUN: 14 mg/dL (ref 6–20)
CO2: 24 mmol/L (ref 22–32)
Calcium: 9.3 mg/dL (ref 8.9–10.3)
Chloride: 102 mmol/L (ref 101–111)
Creatinine, Ser: 1.14 mg/dL — ABNORMAL HIGH (ref 0.44–1.00)
GFR calc Af Amer: >60 mL/min (ref >60)
GFR calc non Af Amer: 58 mL/min — ABNORMAL LOW (ref >60)
Glucose, Bld: 115 mg/dL — ABNORMAL HIGH (ref 65–99)
Potassium: 3.2 mmol/L — ABNORMAL LOW (ref 3.5–5.1)
Sodium: 136 mmol/L (ref 135–145)

## 2016-12-19 LAB — CBC
HCT: 34 % — ABNORMAL LOW (ref 36.0–46.0)
Hemoglobin: 11.9 g/dL — ABNORMAL LOW (ref 12.0–15.0)
MCH: 31.5 pg (ref 26.0–34.0)
MCHC: 35 g/dL (ref 30.0–36.0)
MCV: 89.9 fL (ref 78.0–100.0)
Platelets: 344 10*3/uL (ref 150–400)
RBC: 3.78 MIL/uL — ABNORMAL LOW (ref 3.87–5.11)
RDW: 13.7 % (ref 11.5–15.5)
WBC: 9.1 10*3/uL (ref 4.0–10.5)

## 2016-12-19 LAB — I-STAT TROPONIN, ED
Troponin i, poc: 0 ng/mL (ref 0.00–0.08)
Troponin i, poc: 0.02 ng/mL (ref 0.00–0.08)
Troponin i, poc: 0.03 ng/mL (ref 0.00–0.08)

## 2016-12-19 LAB — LIPASE, BLOOD: Lipase: 22 U/L (ref 11–51)

## 2016-12-19 LAB — D-DIMER, QUANTITATIVE: D-Dimer, Quant: <0.27 ug/mL-FEU (ref 0.00–0.50)

## 2016-12-19 MED ORDER — NITROGLYCERIN 0.4 MG SL SUBL
0.4000 mg | SUBLINGUAL_TABLET | SUBLINGUAL | Status: DC | PRN
  Administered 2016-12-19 (×2): 0.4 mg via SUBLINGUAL
  Filled 2016-12-19: qty 1

## 2016-12-19 NOTE — Discharge Instructions (Signed)

## 2016-12-19 NOTE — ED Provider Notes (Signed)
[facility] DEPT Provider Note   CSN: [id number] Arrival date & time: 12/18/16  2326  By signing my name below, I, Gwenlyn Fudge, attest that this documentation has been prepared under the direction and in the presence of Ripley Fraise, MD. Electronically Signed: Gwenlyn Fudge, ED Scribe. 12/19/16. 1:04 AM.   History   Chief Complaint Chief Complaint  Patient presents with  . Chest Pain  . Hypertension  . Migraine   The history is provided by the patient. No language interpreter was used.   HPI Comments: Anna Henson is a 46 y.o. female with PMHx of CAD, HTN. HLD, and CKD who presents to the Emergency Department complaining of acute onset, episodic, constant, sharp central chest pain for 4 days. Pt states pain radiates through to her back, but denies tearing/ripping sensation or pain with breathing. She has not experienced similar pain before. During her first episode 4 days ago, she reports chest pain with blood pressure up to 200/110 and vomiting. She took x2 nitroglycerin with moderate relief, but started to experience splitting headache that caused "spots" to appear in her vision. During tonight's episode she experienced same chest pain with increased blood pressure with temporary moderate relief with nitroglycerin, Metoprolol and x4 Aspirin. She reports associated vomiting, coughing and shortness of breath. Denies PMHx  PE/DVT or PSHx of cholecystectomy. She is currently at the end of menstrual cycle. 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 No estrogen replacement, recent travel, smoking or drug usage that could affect her heart. She denies diarrhea, hemoptysis, abdominal pain, or acute weakness.  Past Medical History:  Diagnosis Date  . ACS (acute coronary syndrome) ([place]) 09/13/2014   Unstable anginal pain s/p 2.25 x 12 mm Promus Premier DES to the RCA  . CAD in native artery   . Chronic kidney disease    she sthinks has stage 2 CKD,  has had US doppler and has some stenosis , this was done in [place]   . CKD (chronic kidney disease), stage II   . Hyperlipidemia   . Hypertension    > 5 years  . Tobacco abuse 09/13/2014    Patient Active Problem List   Diagnosis Date Noted  . Muscle spasms of both lower extremities 04/17/2016  . Gait disturbance 01/25/2016  . 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 Acute-on-chronic kidney injury ([place]) 11/12/2015  . Neck pain 11/12/2015  . Hypokalemia 11/12/2015  . Abnormal MRI, neck 11/12/2015  . Numbness 11/12/2015  . Hypertension   . Hyperlipidemia   . CAD in native artery   . Facial numbness   . Right arm numbness   . Throat pain 11/09/2015  . Drooling 11/09/2015  . CAD (coronary artery disease), native coronary artery 09/15/2014  . Headache 09/15/2014  . UTI (urinary tract infection): Probable 09/14/2014  . Candida infection of genital region 09/14/2014  . Unstable angina ([place]) 09/13/2014  . HLD (hyperlipidemia) 09/13/2014  . Otitis 09/13/2014  . ACS (acute coronary syndrome) ([place]) 09/13/2014  . Tobacco abuse 09/13/2014  . Essential hypertension 09/09/2014    Past Surgical History:  Procedure Laterality Date  . CORONARY ANGIOPLASTY WITH STENT PLACEMENT     L main OK, LAD OK, CFX 30%, RCA 90%-0% w/ 2.25 x 12 mm Promus Premier DES, EF 55%  . HERNIA REPAIR    . LEFT HEART CATHETERIZATION WITH CORONARY ANGIOGRAM N/A 09/14/2014   Procedure: LEFT HEART CATHETERIZATION WITH CORONARY ANGIOGRAM;  Surgeon: Burnell Blanks, MD;  Location: [facility] CATH LAB;  Service: Cardiovascular;  Laterality: N/A;  . PERCUTANEOUS CORONARY STENT INTERVENTION (PCI-S)  09/14/2014   Procedure: PERCUTANEOUS CORONARY STENT INTERVENTION (PCI-S);  Surgeon: Burnell Blanks, MD;  Location: [facility] CATH LAB;  Service: Cardiovascular;;  . TONSILLECTOMY  2005   10 years ago    OB History    No data available       Home Medications    Prior to Admission medications   Medication Sig Start  Date End Date Taking? Authorizing Provider  aspirin 81 MG chewable tablet Chew 1 tablet (81 mg total) by mouth daily. 09/16/14   Nita Sells, MD  atorvastatin (LIPITOR) 80 MG tablet TAKE 1 TABLET BY MOUTH EVERY DAY 08/25/16   Wendie Agreste, MD  baclofen (LIORESAL) 10 MG tablet Take one po in AM, one po in PM and two po qHS 04/17/16   Britt Bottom, MD  cephALEXin (KEFLEX) 500 MG capsule Take 1 capsule (500 mg total) by mouth 3 (three) times daily. 11/29/16   Alveda Reasons, MD  fluticasone (FLONASE) 50 MCG/ACT nasal spray SHAKE LIQUID AND USE 2 SPRAYS IN EACH NOSTRIL DAILY 12/04/16   Chelle Jeffery, PA-C  gabapentin (NEURONTIN) 300 MG capsule Take one pill in the morning, two in the afternoon and two po at bedtime 08/21/16   Britt Bottom, MD  metoprolol (LOPRESSOR) 50 MG tablet TAKE 1 TABLET BY MOUTH TWICE DAILY. 11/29/16   Alveda Reasons, MD  nitroGLYCERIN (NITROSTAT) 0.4 MG SL tablet Place 1 tablet (0.4 mg total) under the tongue every 5 (five) minutes as needed for chest pain. 10/20/16 01/18/17  Erma Heritage, PA  phenazopyridine (PYRIDIUM) 200 MG tablet Take 1 tablet (200 mg total) by mouth 3 (three) times daily as needed for pain. 11/29/16   Alveda Reasons, MD  traMADol (ULTRAM) 50 MG tablet Take 1 tablet (50 mg total) by mouth 4 (four) times daily as needed. 08/21/16   Britt Bottom, MD  triamterene-hydrochlorothiazide (DYAZIDE) 37.5-25 MG capsule Take 1 each (1 capsule total) by mouth daily. 05/31/16   Joretta Bachelor, PA    Family History Family History  Problem Relation Age of Onset  . Diabetes Mother   . Heart disease Mother   . Hyperlipidemia Mother   . Hypertension Mother   . Lung cancer Father     Social History Social History  Substance Use Topics  . Smoking status: Former Smoker    Packs/day: 0.50    Types: Cigarettes  . Smokeless tobacco: Never Used     Comment: on patch since 09/09/14  . Alcohol use No     Allergies   Patient has no known  allergies.   Review of Systems Review of Systems  Respiratory: Positive for cough and shortness of breath.        No hemoptysis  Cardiovascular: Positive for chest pain.       +Hypertension  Gastrointestinal: Positive for vomiting. Negative for abdominal pain and diarrhea.  Neurological: Positive for headaches. Negative for weakness.   Physical Exam Updated Vital Signs BP 131/78 (BP Location: Left Arm)   Pulse (!) 58   Temp 98.5 F (36.9 C) (Oral)   Resp 16   Ht 5\' 2"  (1.575 m)   Wt 145 lb (65.8 kg)   LMP 12/12/2016   SpO2 100%   BMI 26.52 kg/m   Physical Exam CONSTITUTIONAL: Well developed/well nourished HEAD: Normocephalic/atraumatic EYES: EOMI/PERRL ENMT: Mucous membranes moist NECK: supple no meningeal signs SPINE/BACK:entire spine nontender CV: S1/S2 noted, no  murmurs/rubs/gallops noted LUNGS: Lungs are clear to auscultation bilaterally, no apparent distress ABDOMEN: soft, nontender, no rebound or guarding, bowel sounds noted throughout abdomen GU:no cva tenderness NEURO: Pt is awake/alert/appropriate, moves all extremitiesx4.  No facial droop, no arm or leg drift noted EXTREMITIES: pulses normal/equal, full ROM, no calf tenderness SKIN: warm, color normal PSYCH: no abnormalities of mood noted, alert and oriented to situation  ED Treatments / Results  DIAGNOSTIC STUDIES: Oxygen Saturation is 100% on RA, normal by my interpretation.    COORDINATION OF CARE: 12:53 AM Discussed treatment plan with pt at bedside which includes D-Dimer, Hepatic function panel, Lipase, and EKG and pt agreed to plan.  Labs (all labs ordered are listed, but only abnormal results are displayed) Labs Reviewed  BASIC METABOLIC PANEL - Abnormal; Notable for the following:       Result Value   Potassium 3.2 (*)    Glucose, Bld 115 (*)    Creatinine, Ser 1.14 (*)    GFR calc non Af Amer 58 (*)    All other components within normal limits  CBC - Abnormal; Notable for the following:      RBC 3.78 (*)    Hemoglobin 11.9 (*)    HCT 34.0 (*)    All other components within normal limits  HEPATIC FUNCTION PANEL - Abnormal; Notable for the following:    Total Protein 6.0 (*)    Bilirubin, Direct <0.1 (*)    All other components within normal limits  LIPASE, BLOOD  D-DIMER, QUANTITATIVE (NOT AT [facility])  I-STAT TROPOININ, ED  I-STAT TROPOININ, ED  Randolm Idol, ED    EKG  EKG Interpretation  Date/Time:  Tuesday December 19 2016 03:55:52 EDT Ventricular Rate:  55 PR Interval:  150 QRS Duration: 74 QT Interval:  462 QTC Calculation: 442 R Axis:   62 Text Interpretation:  Sinus rhythm Low voltage, precordial leads Anteroseptal infarct, old No significant change since last tracing Confirmed by Christy Gentles  MD, Mitchel Delduca ([id number]) on 12/19/2016 4:05:01 AM       Radiology Dg Chest 2 View  Result Date: 12/19/2016 CLINICAL DATA:  Mid chest pain today with pain radiating to the back. Pain 4 days ago with shortness of breath and hypertension. EXAM: CHEST  2 VIEW COMPARISON:  CT chest 11/12/2015.  Chest 09/13/2014 FINDINGS: The heart size and mediastinal contours are within normal limits. Both lungs are clear. The visualized skeletal structures are unremarkable. IMPRESSION: No active cardiopulmonary disease. Electronically Signed   By: Lucienne Capers M.D.   On: 12/19/2016 00:12    Procedures Procedures   Medications Ordered in ED Medications  nitroGLYCERIN (NITROSTAT) SL tablet 0.4 mg (0.4 mg Sublingual Given 12/19/16 0252)     Initial Impression / Assessment and Plan / ED Course  I have reviewed the triage vital signs and the nursing notes.  Pertinent labs & imaging results that were available during my care of the patient were reviewed by me and considered in my medical decision making (see chart for details).  3:06 AM Pt stable D-dimer negative Pt still with persistent CP despite recent negative stress imaging I doubt dissection and PE ruled out Will consult her  cardiology team 5:04 AM I spoke to dr Aundra Dubin with cardiology He reports if pain has been persistent (pt reports constant pain for 4 days) and she has negative troponins, she can be discharged Pt is comfortable with this plan Will order repeat troponin at 6am    At time of discharge, pt  Well appearing  Pt never had significant elevation in troponin She feels comfortable for d/c home   I personally performed the services described in this documentation, which was scribed in my presence. The recorded information has been reviewed and is accurate.        Final Clinical Impressions(s) / ED Diagnoses   Final diagnoses:  Precordial pain    New Prescriptions New Prescriptions   No medications on file     Ripley Fraise, MD 12/19/16 [id number]

## 2016-12-19 NOTE — ED Notes (Signed)
repaged cards- ty

## 2016-12-19 NOTE — ED Notes (Signed)
NT [facility] drawing labs

## 2016-12-19 NOTE — ED Notes (Signed)
Dr. Wickline at bedside.  

## 2016-12-29 ENCOUNTER — Other Ambulatory Visit: Payer: Self-pay | Admitting: Physician Assistant

## 2016-12-29 ENCOUNTER — Other Ambulatory Visit: Payer: Self-pay | Admitting: Family Medicine

## 2016-12-29 DIAGNOSIS — E785 Hyperlipidemia, unspecified: Secondary | ICD-10-CM

## 2016-12-30 MED ORDER — IOPAMIDOL (ISOVUE-370) INJECTION 76%
INTRAVENOUS | Status: AC
  Filled 2016-12-30: qty 125

## 2016-12-30 MED ORDER — HEPARIN SODIUM (PORCINE) 1000 UNIT/ML IJ SOLN
INTRAMUSCULAR | Status: AC
  Filled 2016-12-30: qty 1

## 2016-12-30 MED ORDER — NITROGLYCERIN 1 MG/10 ML FOR IR/CATH LAB
INTRA_ARTERIAL | Status: AC
  Filled 2016-12-30: qty 10

## 2016-12-30 MED ORDER — LIDOCAINE HCL (PF) 1 % IJ SOLN
INTRAMUSCULAR | Status: AC
  Filled 2016-12-30: qty 30

## 2016-12-30 MED ORDER — MIDAZOLAM HCL 2 MG/2ML IJ SOLN
INTRAMUSCULAR | Status: AC
  Filled 2016-12-30: qty 2

## 2016-12-30 MED ORDER — VERAPAMIL HCL 2.5 MG/ML IV SOLN
INTRAVENOUS | Status: AC
  Filled 2016-12-30: qty 2

## 2016-12-30 MED ORDER — HEPARIN (PORCINE) IN NACL 2-0.9 UNIT/ML-% IJ SOLN
INTRAMUSCULAR | Status: AC
  Filled 2016-12-30: qty 1000

## 2016-12-30 MED ORDER — FENTANYL CITRATE (PF) 100 MCG/2ML IJ SOLN
INTRAMUSCULAR | Status: AC
  Filled 2016-12-30: qty 2

## 2016-12-31 ENCOUNTER — Inpatient Hospital Stay (HOSPITAL_COMMUNITY)
Admission: EM | Admit: 2016-12-31 | Discharge: 2017-01-02 | DRG: 247 | Disposition: A | Discharge status: Home / Self Care | Payer: Federal, State, Local not specified - PPO | Attending: Cardiovascular Disease | Admitting: Cardiovascular Disease

## 2016-12-31 ENCOUNTER — Encounter (HOSPITAL_COMMUNITY): Payer: Self-pay | Admitting: Emergency Medicine

## 2016-12-31 ENCOUNTER — Inpatient Hospital Stay (HOSPITAL_COMMUNITY)
Admission: EM | Disposition: A | Discharge status: Home / Self Care | Payer: Self-pay | Source: Home / Self Care | Attending: Cardiovascular Disease

## 2016-12-31 ENCOUNTER — Emergency Department (HOSPITAL_COMMUNITY): Payer: Federal, State, Local not specified - PPO

## 2016-12-31 DIAGNOSIS — I10 Essential (primary) hypertension: Secondary | ICD-10-CM | POA: Diagnosis not present

## 2016-12-31 DIAGNOSIS — N189 Chronic kidney disease, unspecified: Secondary | ICD-10-CM | POA: Diagnosis not present

## 2016-12-31 DIAGNOSIS — N182 Chronic kidney disease, stage 2 (mild): Secondary | ICD-10-CM | POA: Diagnosis present

## 2016-12-31 DIAGNOSIS — Z9889 Other specified postprocedural states: Secondary | ICD-10-CM

## 2016-12-31 DIAGNOSIS — R9431 Abnormal electrocardiogram [ECG] [EKG]: Secondary | ICD-10-CM

## 2016-12-31 DIAGNOSIS — I2510 Atherosclerotic heart disease of native coronary artery without angina pectoris: Secondary | ICD-10-CM | POA: Diagnosis present

## 2016-12-31 DIAGNOSIS — I2111 ST elevation (STEMI) myocardial infarction involving right coronary artery: Secondary | ICD-10-CM

## 2016-12-31 DIAGNOSIS — R110 Nausea: Secondary | ICD-10-CM

## 2016-12-31 DIAGNOSIS — E785 Hyperlipidemia, unspecified: Secondary | ICD-10-CM | POA: Diagnosis present

## 2016-12-31 DIAGNOSIS — Z79899 Other long term (current) drug therapy: Secondary | ICD-10-CM | POA: Diagnosis not present

## 2016-12-31 DIAGNOSIS — Z87891 Personal history of nicotine dependence: Secondary | ICD-10-CM | POA: Diagnosis not present

## 2016-12-31 DIAGNOSIS — I214 Non-ST elevation (NSTEMI) myocardial infarction: Secondary | ICD-10-CM | POA: Diagnosis present

## 2016-12-31 DIAGNOSIS — Z7982 Long term (current) use of aspirin: Secondary | ICD-10-CM

## 2016-12-31 DIAGNOSIS — E876 Hypokalemia: Secondary | ICD-10-CM | POA: Diagnosis present

## 2016-12-31 DIAGNOSIS — I129 Hypertensive chronic kidney disease with stage 1 through stage 4 chronic kidney disease, or unspecified chronic kidney disease: Secondary | ICD-10-CM | POA: Diagnosis present

## 2016-12-31 DIAGNOSIS — Z955 Presence of coronary angioplasty implant and graft: Secondary | ICD-10-CM | POA: Diagnosis not present

## 2016-12-31 DIAGNOSIS — I255 Ischemic cardiomyopathy: Secondary | ICD-10-CM | POA: Diagnosis present

## 2016-12-31 DIAGNOSIS — E782 Mixed hyperlipidemia: Secondary | ICD-10-CM

## 2016-12-31 DIAGNOSIS — I213 ST elevation (STEMI) myocardial infarction of unspecified site: Secondary | ICD-10-CM | POA: Diagnosis not present

## 2016-12-31 HISTORY — PX: LEFT HEART CATH AND CORONARY ANGIOGRAPHY: CATH118249

## 2016-12-31 HISTORY — PX: CORONARY/GRAFT ACUTE MI REVASCULARIZATION: CATH118305

## 2016-12-31 LAB — COMPREHENSIVE METABOLIC PANEL
ALT: 27 U/L (ref 14–54)
AST: 98 U/L — ABNORMAL HIGH (ref 15–41)
Albumin: 3.5 g/dL (ref 3.5–5.0)
Alkaline Phosphatase: 48 U/L (ref 38–126)
Anion gap: 9 (ref 5–15)
BUN: 6 mg/dL (ref 6–20)
CO2: 21 mmol/L — ABNORMAL LOW (ref 22–32)
Calcium: 8.4 mg/dL — ABNORMAL LOW (ref 8.9–10.3)
Chloride: 108 mmol/L (ref 101–111)
Creatinine, Ser: 0.94 mg/dL (ref 0.44–1.00)
GFR calc Af Amer: >60 mL/min (ref >60)
GFR calc non Af Amer: >60 mL/min (ref >60)
Glucose, Bld: 127 mg/dL — ABNORMAL HIGH (ref 65–99)
Potassium: 3.2 mmol/L — ABNORMAL LOW (ref 3.5–5.1)
Sodium: 138 mmol/L (ref 135–145)
Total Bilirubin: 0.3 mg/dL (ref 0.3–1.2)
Total Protein: 5.8 g/dL — ABNORMAL LOW (ref 6.5–8.1)

## 2016-12-31 LAB — CBC WITH DIFFERENTIAL/PLATELET
Basophils Absolute: 0 10*3/uL (ref 0.0–0.1)
Basophils Relative: 0 %
Eosinophils Absolute: 0.2 10*3/uL (ref 0.0–0.7)
Eosinophils Relative: 1 %
HCT: 33.3 % — ABNORMAL LOW (ref 36.0–46.0)
Hemoglobin: 11.5 g/dL — ABNORMAL LOW (ref 12.0–15.0)
Lymphocytes Relative: 16 %
Lymphs Abs: 2.5 10*3/uL (ref 0.7–4.0)
MCH: 31 pg (ref 26.0–34.0)
MCHC: 34.5 g/dL (ref 30.0–36.0)
MCV: 89.8 fL (ref 78.0–100.0)
Monocytes Absolute: 0.8 10*3/uL (ref 0.1–1.0)
Monocytes Relative: 5 %
Neutro Abs: 11.9 10*3/uL — ABNORMAL HIGH (ref 1.7–7.7)
Neutrophils Relative %: 78 %
Platelets: 316 10*3/uL (ref 150–400)
RBC: 3.71 MIL/uL — ABNORMAL LOW (ref 3.87–5.11)
RDW: 14 % (ref 11.5–15.5)
WBC: 15.4 10*3/uL — ABNORMAL HIGH (ref 4.0–10.5)

## 2016-12-31 LAB — I-STAT CHEM 8, ED
BUN: 11 mg/dL (ref 6–20)
Calcium, Ion: 1.09 mmol/L — ABNORMAL LOW (ref 1.15–1.40)
Chloride: 105 mmol/L (ref 101–111)
Creatinine, Ser: 1.1 mg/dL — ABNORMAL HIGH (ref 0.44–1.00)
Glucose, Bld: 122 mg/dL — ABNORMAL HIGH (ref 65–99)
HCT: 34 % — ABNORMAL LOW (ref 36.0–46.0)
Hemoglobin: 11.6 g/dL — ABNORMAL LOW (ref 12.0–15.0)
Potassium: 3.4 mmol/L — ABNORMAL LOW (ref 3.5–5.1)
Sodium: 139 mmol/L (ref 135–145)
TCO2: 21 mmol/L (ref 0–100)

## 2016-12-31 LAB — CBC
HCT: 29.9 % — ABNORMAL LOW (ref 36.0–46.0)
HCT: 31.6 % — ABNORMAL LOW (ref 36.0–46.0)
Hemoglobin: 10.4 g/dL — ABNORMAL LOW (ref 12.0–15.0)
Hemoglobin: 10.9 g/dL — ABNORMAL LOW (ref 12.0–15.0)
MCH: 31.1 pg (ref 26.0–34.0)
MCH: 30.8 pg (ref 26.0–34.0)
MCHC: 34.8 g/dL (ref 30.0–36.0)
MCHC: 34.5 g/dL (ref 30.0–36.0)
MCV: 89.5 fL (ref 78.0–100.0)
MCV: 89.3 fL (ref 78.0–100.0)
Platelets: 296 10*3/uL (ref 150–400)
Platelets: 296 10*3/uL (ref 150–400)
RBC: 3.34 MIL/uL — ABNORMAL LOW (ref 3.87–5.11)
RBC: 3.54 MIL/uL — ABNORMAL LOW (ref 3.87–5.11)
RDW: 14 % (ref 11.5–15.5)
RDW: 13.6 % (ref 11.5–15.5)
WBC: 13.3 10*3/uL — ABNORMAL HIGH (ref 4.0–10.5)
WBC: 14 10*3/uL — ABNORMAL HIGH (ref 4.0–10.5)

## 2016-12-31 LAB — BASIC METABOLIC PANEL
Anion gap: 10 (ref 5–15)
BUN: 10 mg/dL (ref 6–20)
CO2: 20 mmol/L — ABNORMAL LOW (ref 22–32)
Calcium: 8.9 mg/dL (ref 8.9–10.3)
Chloride: 106 mmol/L (ref 101–111)
Creatinine, Ser: 1.07 mg/dL — ABNORMAL HIGH (ref 0.44–1.00)
GFR calc Af Amer: >60 mL/min (ref >60)
GFR calc non Af Amer: >60 mL/min (ref >60)
Glucose, Bld: 120 mg/dL — ABNORMAL HIGH (ref 65–99)
Potassium: 3.4 mmol/L — ABNORMAL LOW (ref 3.5–5.1)
Sodium: 136 mmol/L (ref 135–145)

## 2016-12-31 LAB — LIPID PANEL
Cholesterol: 139 mg/dL (ref 0–200)
HDL: 36 mg/dL — ABNORMAL LOW (ref >40)
LDL Cholesterol: 83 mg/dL (ref 0–99)
Total CHOL/HDL Ratio: 3.9 RATIO
Triglycerides: 100 mg/dL (ref <150)
VLDL: 20 mg/dL (ref 0–40)

## 2016-12-31 LAB — HIV ANTIBODY (ROUTINE TESTING W REFLEX): HIV Screen 4th Generation wRfx: NONREACTIVE

## 2016-12-31 LAB — TROPONIN I
Troponin I: 26.44 ng/mL (ref <0.03)
Troponin I: 16.83 ng/mL (ref <0.03)
Troponin I: 13.43 ng/mL (ref <0.03)
Troponin I: 14.39 ng/mL (ref <0.03)

## 2016-12-31 LAB — I-STAT TROPONIN, ED: Troponin i, poc: 3.08 ng/mL (ref 0.00–0.08)

## 2016-12-31 LAB — C-REACTIVE PROTEIN: CRP: <0.8 mg/dL (ref <1.0)

## 2016-12-31 LAB — TSH: TSH: 1.648 u[IU]/mL (ref 0.350–4.500)

## 2016-12-31 LAB — I-STAT BETA HCG BLOOD, ED (MC, WL, AP ONLY): I-stat hCG, quantitative: <5 m[IU]/mL (ref <5)

## 2016-12-31 LAB — MRSA PCR SCREENING: MRSA by PCR: NEGATIVE

## 2016-12-31 SURGERY — LEFT HEART CATH AND CORONARY ANGIOGRAPHY
Anesthesia: LOCAL

## 2016-12-31 MED ORDER — TIROFIBAN (AGGRASTAT) BOLUS VIA INFUSION
INTRAVENOUS | Status: DC | PRN
  Administered 2016-12-31: 1645 ug via INTRAVENOUS

## 2016-12-31 MED ORDER — TRIAMTERENE-HCTZ 37.5-25 MG PO TABS
1.0000 | ORAL_TABLET | Freq: Every day | ORAL | Status: DC
  Administered 2016-12-31 – 2017-01-02 (×3): 1 via ORAL
  Filled 2016-12-31 (×3): qty 1

## 2016-12-31 MED ORDER — SODIUM CHLORIDE 0.9 % IV SOLN
INTRAVENOUS | Status: DC | PRN
  Administered 2016-12-31: 1 mL/kg/h via INTRAVENOUS

## 2016-12-31 MED ORDER — LABETALOL HCL 5 MG/ML IV SOLN
INTRAVENOUS | Status: DC | PRN
  Administered 2016-12-31: 10 mg via INTRAVENOUS

## 2016-12-31 MED ORDER — VERAPAMIL HCL 2.5 MG/ML IV SOLN
INTRAVENOUS | Status: DC | PRN
  Administered 2016-12-31: 10 mL via INTRA_ARTERIAL

## 2016-12-31 MED ORDER — ACETAMINOPHEN 325 MG PO TABS: 650.0000 mg | ORAL_TABLET | ORAL | Status: DC | PRN

## 2016-12-31 MED ORDER — FENTANYL CITRATE (PF) 100 MCG/2ML IJ SOLN
INTRAMUSCULAR | Status: DC | PRN
  Administered 2016-12-31: 50 ug via INTRAVENOUS
  Administered 2016-12-31 (×2): 25 ug via INTRAVENOUS

## 2016-12-31 MED ORDER — IOPAMIDOL (ISOVUE-370) INJECTION 76%
INTRAVENOUS | Status: DC | PRN
  Administered 2016-12-31: 125 mL via INTRA_ARTERIAL

## 2016-12-31 MED ORDER — HEPARIN (PORCINE) IN NACL 100-0.45 UNIT/ML-% IJ SOLN: 12.0000 [IU]/kg/h | INTRAMUSCULAR | Status: DC

## 2016-12-31 MED ORDER — FENTANYL CITRATE (PF) 100 MCG/2ML IJ SOLN
50.0000 ug | Freq: Once | INTRAMUSCULAR | Status: AC
  Administered 2016-12-31: 50 ug via INTRAVENOUS
  Filled 2016-12-31: qty 2

## 2016-12-31 MED ORDER — TICAGRELOR 90 MG PO TABS
ORAL_TABLET | ORAL | Status: AC
  Filled 2016-12-31: qty 2

## 2016-12-31 MED ORDER — METOCLOPRAMIDE HCL 5 MG/ML IJ SOLN
5.0000 mg | Freq: Three times a day (TID) | INTRAMUSCULAR | Status: DC
  Administered 2016-12-31 – 2017-01-01 (×5): 5 mg via INTRAVENOUS
  Filled 2016-12-31 (×5): qty 2

## 2016-12-31 MED ORDER — POTASSIUM CHLORIDE CRYS ER 20 MEQ PO TBCR
40.0000 meq | EXTENDED_RELEASE_TABLET | Freq: Every day | ORAL | Status: DC
Start: 2016-12-31 — End: 2017-01-02
  Administered 2016-12-31 – 2017-01-02 (×3): 40 meq via ORAL
  Filled 2016-12-31 (×3): qty 2

## 2016-12-31 MED ORDER — NITROGLYCERIN 0.4 MG SL SUBL: 0.4000 mg | SUBLINGUAL_TABLET | SUBLINGUAL | Status: DC | PRN

## 2016-12-31 MED ORDER — ASPIRIN 81 MG PO CHEW: 324.0000 mg | CHEWABLE_TABLET | Freq: Once | ORAL | Status: DC

## 2016-12-31 MED ORDER — LABETALOL HCL 5 MG/ML IV SOLN
10.0000 mg | INTRAVENOUS | Status: AC | PRN
  Administered 2016-12-31: 10 mg via INTRAVENOUS
  Filled 2016-12-31: qty 4

## 2016-12-31 MED ORDER — ASPIRIN 81 MG PO CHEW
81.0000 mg | CHEWABLE_TABLET | Freq: Every day | ORAL | Status: DC
  Administered 2016-12-31 – 2017-01-02 (×3): 81 mg via ORAL
  Filled 2016-12-31 (×3): qty 1

## 2016-12-31 MED ORDER — LABETALOL HCL 5 MG/ML IV SOLN: 10.0000 mg | INTRAVENOUS | Status: DC | PRN

## 2016-12-31 MED ORDER — TIROFIBAN HCL IN NACL 5-0.9 MG/100ML-% IV SOLN
INTRAVENOUS | Status: AC
  Filled 2016-12-31: qty 100

## 2016-12-31 MED ORDER — HYDRALAZINE HCL 20 MG/ML IJ SOLN: 5.0000 mg | INTRAMUSCULAR | Status: AC | PRN

## 2016-12-31 MED ORDER — SODIUM CHLORIDE 0.9% FLUSH
3.0000 mL | Freq: Two times a day (BID) | INTRAVENOUS | Status: DC
  Administered 2016-12-31 – 2017-01-02 (×6): 3 mL via INTRAVENOUS

## 2016-12-31 MED ORDER — SODIUM CHLORIDE 0.9 % IV SOLN: 250.0000 mL | INTRAVENOUS | Status: DC | PRN

## 2016-12-31 MED ORDER — NITROGLYCERIN IN D5W 200-5 MCG/ML-% IV SOLN
0.0000 ug/min | Freq: Once | INTRAVENOUS | Status: AC
  Administered 2016-12-31: 5 ug/min via INTRAVENOUS
  Filled 2016-12-31: qty 250

## 2016-12-31 MED ORDER — SODIUM CHLORIDE 0.9 % WEIGHT BASED INFUSION
1.0000 mL/kg/h | INTRAVENOUS | Status: AC
  Administered 2016-12-31: 1 mL/kg/h via INTRAVENOUS

## 2016-12-31 MED ORDER — POTASSIUM CHLORIDE CRYS ER 10 MEQ PO TBCR
20.0000 meq | EXTENDED_RELEASE_TABLET | Freq: Two times a day (BID) | ORAL | Status: DC
  Administered 2016-12-31 (×2): 20 meq via ORAL
  Filled 2016-12-31 (×3): qty 2

## 2016-12-31 MED ORDER — METOPROLOL TARTRATE 5 MG/5ML IV SOLN
INTRAVENOUS | Status: AC
Start: 2016-12-31 — End: 2016-12-31
  Filled 2016-12-31: qty 5

## 2016-12-31 MED ORDER — SODIUM CHLORIDE 0.9 % IV SOLN
8.0000 mg | Freq: Four times a day (QID) | INTRAVENOUS | Status: DC
  Administered 2016-12-31 – 2017-01-01 (×5): 8 mg via INTRAVENOUS
  Filled 2016-12-31 (×7): qty 4

## 2016-12-31 MED ORDER — MIDAZOLAM HCL 2 MG/2ML IJ SOLN
INTRAMUSCULAR | Status: AC
  Filled 2016-12-31: qty 2

## 2016-12-31 MED ORDER — HYDRALAZINE HCL 20 MG/ML IJ SOLN: 10.0000 mg | INTRAMUSCULAR | Status: DC | PRN

## 2016-12-31 MED ORDER — ONDANSETRON HCL 4 MG/2ML IJ SOLN: 4.0000 mg | Freq: Four times a day (QID) | INTRAMUSCULAR | Status: DC | PRN

## 2016-12-31 MED ORDER — LIDOCAINE HCL (PF) 1 % IJ SOLN
INTRAMUSCULAR | Status: DC | PRN
  Administered 2016-12-31: 2 mL

## 2016-12-31 MED ORDER — LISINOPRIL 5 MG PO TABS
5.0000 mg | ORAL_TABLET | Freq: Every day | ORAL | Status: DC
  Administered 2016-12-31 – 2017-01-01 (×2): 5 mg via ORAL
  Filled 2016-12-31 (×2): qty 1

## 2016-12-31 MED ORDER — TICAGRELOR 90 MG PO TABS
ORAL_TABLET | ORAL | Status: DC | PRN
  Administered 2016-12-31: 180 mg via ORAL

## 2016-12-31 MED ORDER — HEPARIN (PORCINE) IN NACL 100-0.45 UNIT/ML-% IJ SOLN
900.0000 [IU]/h | INTRAMUSCULAR | Status: DC
  Administered 2016-12-31: 900 [IU]/h via INTRAVENOUS
  Filled 2016-12-31: qty 250

## 2016-12-31 MED ORDER — TICAGRELOR 90 MG PO TABS
90.0000 mg | ORAL_TABLET | Freq: Two times a day (BID) | ORAL | Status: DC
  Administered 2016-12-31 – 2017-01-02 (×5): 90 mg via ORAL
  Filled 2016-12-31 (×5): qty 1

## 2016-12-31 MED ORDER — METOPROLOL TARTRATE 5 MG/5ML IV SOLN
INTRAVENOUS | Status: DC | PRN
  Administered 2016-12-31: 5 mg via INTRAVENOUS

## 2016-12-31 MED ORDER — HEPARIN SODIUM (PORCINE) 1000 UNIT/ML IJ SOLN
INTRAMUSCULAR | Status: DC | PRN
  Administered 2016-12-31: 4000 [IU] via INTRAVENOUS
  Administered 2016-12-31: 2000 [IU] via INTRAVENOUS

## 2016-12-31 MED ORDER — ACETAMINOPHEN 325 MG PO TABS
650.0000 mg | ORAL_TABLET | ORAL | Status: DC | PRN
  Administered 2016-12-31 – 2017-01-02 (×3): 650 mg via ORAL
  Filled 2016-12-31 (×3): qty 2

## 2016-12-31 MED ORDER — NITROGLYCERIN 1 MG/10 ML FOR IR/CATH LAB
INTRA_ARTERIAL | Status: DC | PRN
  Administered 2016-12-31 (×3): 200 ug via INTRACORONARY

## 2016-12-31 MED ORDER — HEPARIN BOLUS VIA INFUSION
4000.0000 [IU] | Freq: Once | INTRAVENOUS | Status: AC
  Administered 2016-12-31: 4000 [IU] via INTRAVENOUS
  Filled 2016-12-31: qty 4000

## 2016-12-31 MED ORDER — ENOXAPARIN SODIUM 40 MG/0.4ML ~~LOC~~ SOLN
40.0000 mg | SUBCUTANEOUS | Status: DC
  Administered 2017-01-02: 40 mg via SUBCUTANEOUS
  Filled 2016-12-31 (×2): qty 0.4

## 2016-12-31 MED ORDER — HEPARIN (PORCINE) IN NACL 2-0.9 UNIT/ML-% IJ SOLN
INTRAMUSCULAR | Status: DC | PRN
  Administered 2016-12-31: 1000 mL

## 2016-12-31 MED ORDER — PANTOPRAZOLE SODIUM 40 MG PO TBEC
40.0000 mg | DELAYED_RELEASE_TABLET | Freq: Two times a day (BID) | ORAL | Status: DC
  Administered 2016-12-31 – 2017-01-02 (×5): 40 mg via ORAL
  Filled 2016-12-31 (×5): qty 1

## 2016-12-31 MED ORDER — METOPROLOL TARTRATE 50 MG PO TABS
50.0000 mg | ORAL_TABLET | Freq: Two times a day (BID) | ORAL | Status: DC
  Administered 2016-12-31 – 2017-01-02 (×5): 50 mg via ORAL
  Filled 2016-12-31 (×5): qty 1

## 2016-12-31 MED ORDER — SODIUM CHLORIDE 0.9% FLUSH: 3.0000 mL | INTRAVENOUS | Status: DC | PRN

## 2016-12-31 MED ORDER — ONDANSETRON HCL 4 MG/2ML IJ SOLN
4.0000 mg | Freq: Four times a day (QID) | INTRAMUSCULAR | Status: DC | PRN
  Administered 2016-12-31: 4 mg via INTRAVENOUS
  Filled 2016-12-31: qty 2

## 2016-12-31 MED ORDER — MIDAZOLAM HCL 2 MG/2ML IJ SOLN
INTRAMUSCULAR | Status: DC | PRN
  Administered 2016-12-31: 2 mg via INTRAVENOUS
  Administered 2016-12-31 (×2): 1 mg via INTRAVENOUS

## 2016-12-31 MED ORDER — ATORVASTATIN CALCIUM 80 MG PO TABS
80.0000 mg | ORAL_TABLET | Freq: Every day | ORAL | Status: DC
  Administered 2016-12-31 – 2017-01-01 (×2): 80 mg via ORAL
  Filled 2016-12-31 (×2): qty 1

## 2016-12-31 MED ORDER — LABETALOL HCL 5 MG/ML IV SOLN
INTRAVENOUS | Status: AC
  Filled 2016-12-31: qty 4

## 2016-12-31 SURGICAL SUPPLY — 21 items
BALLN MOZEC 2.0X12 (BALLOONS) ×2
BALLN ~~LOC~~ EMERGE MR 3.0X12 (BALLOONS) ×2
BALLOON MOZEC 2.0X12 (BALLOONS) ×1 IMPLANT
BALLOON ~~LOC~~ EMERGE MR 3.0X12 (BALLOONS) ×1 IMPLANT
CATH INFINITI 5 FR JL3.5 (CATHETERS) ×2 IMPLANT
CATH INFINITI 5FR ANG PIGTAIL (CATHETERS) ×2 IMPLANT
CATH INFINITI JR4 5F (CATHETERS) ×2 IMPLANT
CATH VISTA GUIDE 6FR JR4 (CATHETERS) ×2 IMPLANT
DEVICE RAD COMP TR BAND LRG (VASCULAR PRODUCTS) ×2 IMPLANT
ELECT DEFIB PAD ADLT CADENCE (PAD) ×2 IMPLANT
GLIDESHEATH SLEND SS 6F .021 (SHEATH) ×2 IMPLANT
GUIDEWIRE INQWIRE 1.5J.035X260 (WIRE) ×1 IMPLANT
INQWIRE 1.5J .035X260CM (WIRE) ×2
KIT ENCORE 26 ADVANTAGE (KITS) ×2 IMPLANT
KIT HEART LEFT (KITS) ×2 IMPLANT
PACK CARDIAC CATHETERIZATION (CUSTOM PROCEDURE TRAY) ×2 IMPLANT
STENT PROMUS PREM MR 2.75X38 (Permanent Stent) ×2 IMPLANT
SYR MEDRAD MARK V 150ML (SYRINGE) ×2 IMPLANT
TRANSDUCER W/STOPCOCK (MISCELLANEOUS) ×2 IMPLANT
TUBING CIL FLEX 10 FLL-RA (TUBING) ×2 IMPLANT
WIRE COUGAR XT STRL 190CM (WIRE) ×2 IMPLANT

## 2016-12-31 NOTE — Progress Notes (Signed)
Dr. William Dalton updated on pts status, orders received

## 2016-12-31 NOTE — Progress Notes (Signed)
cadiology fellow paged, to notify of pt being daiphoretic, nauseated and vomiting about 153ml of maroon colored coffee ground emesis. SBP >160, Labetolol given per PRN order

## 2016-12-31 NOTE — Progress Notes (Signed)
Responded to page to ED D34. Provided spiritual/emotional support and prayer to pt and family member in rm. Chaplain available for f/u.   12/31/16 0200  Clinical Encounter Type  Visited With Patient and family together  Visit Type Initial;Psychological support;Spiritual support;Social support;ED  Referral From Nurse  Spiritual Encounters  Spiritual Needs Prayer;Emotional  Stress Factors  Patient Stress Factors Health changes;Loss of control   Gerrit Heck, Chaplain

## 2016-12-31 NOTE — Progress Notes (Signed)
ANTICOAGULATION CONSULT NOTE - Initial Consult  Pharmacy Consult for Heparin Indication: chest pain/ACS  No Known Allergies  Patient Measurements: Height: 5\' 2"  (157.5 cm) Weight: 145 lb (65.8 kg) IBW/kg (Calculated) : 50.1  Vital Signs: Temp: 97.9 F (36.6 C) (03/25 0034) Temp Source: Oral (03/25 0034) BP: 177/91 (03/25 0034) Pulse Rate: 71 (03/25 0034)  Labs:  Recent Labs  12/31/16 0043 12/31/16 0052  HGB 11.5* 11.6*  HCT 33.3* 34.0*  PLT 316  --   CREATININE  --  1.10*    Estimated Creatinine Clearance: 58.1 mL/min (A) (by C-G formula based on SCr of 1.1 mg/dL (H)).   Medical History: Past Medical History:  Diagnosis Date  . ACS (acute coronary syndrome) ([place]) 09/13/2014   Unstable anginal pain s/p 2.25 x 12 mm Promus Premier DES to the RCA  . CAD in native artery   . Chronic kidney disease    she sthinks has stage 2 CKD, has had US doppler and has some stenosis , this was done in [place]   . CKD (chronic kidney disease), stage II   . Hyperlipidemia   . Hypertension    > 5 years  . Tobacco abuse 09/13/2014    Medications:  See electronic med rec  Assessment: 46 y.o. F presents with CP. To begin heparin for r/o ACS. Hgb 11.6 (stable), plt wnl. No AC PTA.  Goal of Therapy:  Heparin level 0.3-0.7 units/ml Monitor platelets by anticoagulation protocol: Yes   Plan:  Heparin IV bolus 4000 units Heparin gtt at 900 units/hr Will f/u heparin level in 6 hours Daily heparin level and CBC  Sherlon Handing, PharmD, BCPS Clinical pharmacist, pager [phone] 12/31/2016,1:09 AM

## 2016-12-31 NOTE — ED Provider Notes (Signed)
[facility] DEPT Provider Note   CSN: [id number] Arrival date & time: 12/31/16  0092  By signing my name below, I, [name], attest that this documentation has been prepared under the direction and in the presence of Khaalid Lefkowitz, MD. Electronically Signed: [name], ED Scribe. 12/31/16. 1:15 AM.  History   Chief Complaint Chief Complaint  Patient presents with  . Chest Pain   The history is provided by the patient. No language interpreter was used.  Chest Pain   This is a recurrent problem. The current episode started 6 to 12 hours ago. The problem occurs constantly. The problem has not changed since onset.The pain is associated with rest. The pain is present in the substernal region. The pain is at a severity of 8/10. The quality of the pain is described as pressure-like and stabbing. The pain radiates to the mid back. Associated symptoms include diaphoresis and nausea. Pertinent negatives include no fever, no palpitations, no shortness of breath and no vomiting.    HPI Comments: Anna Henson is a 46 y.o. female BIB EMS, with a h/o ACS,CAD s/p stent placement, CKD stage II, HTN/HLD, who presents to the Emergency Department complaining of substernal chest pain beginning yesterday approximately 6 hours ago. She notes radiation of her pain into her mid-back and she describes her pain as pressure-like and intermittently stabbing. She reports associated nausea and diaphoresis secondary to her pain. Pt has a h/o similar intermittent pain over the past week, most recently seen in the ED for same ~8 days ago and at that time she was dx'd w/ precordial pain. Negative serial troponin's were performed at that time, additionally D-dimer was negative as well. Pt states that her current pain is progressively worse then the last time she was seen in the ED. She was given three Nitroglycerin, 324mg  ASA, and 4mg  Zofran while en route by EMS without relief of her pain. Her pain is  exacerbated with deep inspirations. She is currently followed by Dr Debara Pickett of [facility], and she has previously had one stent placed to the RCA. Pt is not currently on anticoagulant or antiplatelet therapy. Pt denies fever, vomiting, or any other associated symptoms.   Past Medical History:  Diagnosis Date  . ACS (acute coronary syndrome) ([place]) 09/13/2014   Unstable anginal pain s/p 2.25 x 12 mm Promus Premier DES to the RCA  . CAD in native artery   . Chronic kidney disease    she sthinks has stage 2 CKD, has had US doppler and has some stenosis , this was done in [place]   . CKD (chronic kidney disease), stage II   . Hyperlipidemia   . Hypertension    > 5 years  . Tobacco abuse 09/13/2014   Patient Active Problem List   Diagnosis Date Noted  . Muscle spasms of both lower extremities 04/17/2016  . Gait disturbance 01/25/2016  . 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 Acute-on-chronic kidney injury ([place]) 11/12/2015  . Neck pain 11/12/2015  . Hypokalemia 11/12/2015  . Abnormal MRI, neck 11/12/2015  . Numbness 11/12/2015  . Hypertension   . Hyperlipidemia   . CAD in native artery   . Facial numbness   . Right arm numbness   . Throat pain 11/09/2015  . Drooling 11/09/2015  . CAD (coronary artery disease), native coronary artery 09/15/2014  . Headache 09/15/2014  . UTI (urinary tract infection): Probable 09/14/2014  . Candida infection of genital region 09/14/2014  . Unstable angina ([place]) 09/13/2014  .  HLD (hyperlipidemia) 09/13/2014  . Otitis 09/13/2014  . ACS (acute coronary syndrome) ([place]) 09/13/2014  . Tobacco abuse 09/13/2014  . Essential hypertension 09/09/2014   Past Surgical History:  Procedure Laterality Date  . CORONARY ANGIOPLASTY WITH STENT PLACEMENT     L main OK, LAD OK, CFX 30%, RCA 90%-0% w/ 2.25 x 12 mm Promus Premier DES, EF 55%  . HERNIA REPAIR    . LEFT HEART CATHETERIZATION WITH CORONARY ANGIOGRAM N/A 09/14/2014   Procedure: LEFT HEART CATHETERIZATION  WITH CORONARY ANGIOGRAM;  Surgeon: Burnell Blanks, MD;  Location: [facility] CATH LAB;  Service: Cardiovascular;  Laterality: N/A;  . PERCUTANEOUS CORONARY STENT INTERVENTION (PCI-S)  09/14/2014   Procedure: PERCUTANEOUS CORONARY STENT INTERVENTION (PCI-S);  Surgeon: Burnell Blanks, MD;  Location: [facility] CATH LAB;  Service: Cardiovascular;;  . TONSILLECTOMY  2005   10 years ago   OB History    No data available     Home Medications    Prior to Admission medications   Medication Sig Start Date End Date Taking? Authorizing Provider  aspirin 81 MG chewable tablet Chew 1 tablet (81 mg total) by mouth daily. 09/16/14  Yes Nita Sells, MD  atorvastatin (LIPITOR) 80 MG tablet TAKE 1 TABLET BY MOUTH EVERY DAY 08/25/16  Yes Wendie Agreste, MD  baclofen (LIORESAL) 10 MG tablet Take one po in AM, one po in PM and two po qHS Patient taking differently: Take 5-10 mg by mouth See admin instructions. Take 1 tablet every morning and every evening then take 2 tablets at bedtime 04/17/16  Yes Britt Bottom, MD  fluticasone (FLONASE) 50 MCG/ACT nasal spray SHAKE LIQUID AND USE 2 SPRAYS IN EACH NOSTRIL DAILY 12/04/16  Yes Chelle Jeffery, PA-C  gabapentin (NEURONTIN) 300 MG capsule Take one pill in the morning, two in the afternoon and two po at bedtime Patient taking differently: Take 300-600 mg by mouth See admin instructions. Take 1 capsule every morning then take 2 capsules in the afternoon and at bedtime 08/21/16  Yes Britt Bottom, MD  metoprolol (LOPRESSOR) 50 MG tablet TAKE 1 TABLET BY MOUTH TWICE DAILY. 11/29/16  Yes Alveda Reasons, MD  nitroGLYCERIN (NITROSTAT) 0.4 MG SL tablet Place 1 tablet (0.4 mg total) under the tongue every 5 (five) minutes as needed for chest pain. 10/20/16 01/18/17 Yes Erma Heritage, PA  triamterene-hydrochlorothiazide (DYAZIDE) 37.5-25 MG capsule Take 1 each (1 capsule total) by mouth daily. 05/31/16  Yes Stephanie D English, PA  atorvastatin (LIPITOR) 80 MG  tablet TAKE 1 TABLET BY MOUTH EVERY DAY. DUE FOR AN OFFICE VISIT AND FASTING LAB Patient not taking: Reported on 12/31/2016 12/29/16   Wardell Honour, MD  cephALEXin (KEFLEX) 500 MG capsule Take 1 capsule (500 mg total) by mouth 3 (three) times daily. Patient not taking: Reported on 12/19/2016 11/29/16   Alveda Reasons, MD  phenazopyridine (PYRIDIUM) 200 MG tablet Take 1 tablet (200 mg total) by mouth 3 (three) times daily as needed for pain. Patient not taking: Reported on 12/19/2016 11/29/16   Alveda Reasons, MD  traMADol (ULTRAM) 50 MG tablet Take 1 tablet (50 mg total) by mouth 4 (four) times daily as needed. Patient not taking: Reported on 12/31/2016 08/21/16   Britt Bottom, MD   Family History Family History  Problem Relation Age of Onset  . Diabetes Mother   . Heart disease Mother   . Hyperlipidemia Mother   . Hypertension Mother   . Lung cancer Father  Social History Social History  Substance Use Topics  . Smoking status: Former Smoker    Packs/day: 0.50    Types: Cigarettes  . Smokeless tobacco: Never Used     Comment: on patch since 09/09/14  . Alcohol use No   Allergies   Patient has no known allergies. Review of Systems Review of Systems  Constitutional: Positive for diaphoresis. Negative for appetite change, chills and fever.  HENT: Negative for drooling and facial swelling.   Eyes: Negative for photophobia.  Respiratory: Negative for shortness of breath.   Cardiovascular: Positive for chest pain. Negative for palpitations and leg swelling.  Gastrointestinal: Positive for nausea. Negative for anal bleeding and vomiting.  Genitourinary: Negative for difficulty urinating.  Neurological: Negative for facial asymmetry and speech difficulty.  Psychiatric/Behavioral: Negative for suicidal ideas.  All other systems reviewed and are negative.  Physical Exam Updated Vital Signs BP (!) 177/91 (BP Location: Right Arm)   Pulse 71   Temp 97.9 F (36.6 C) (Oral)    Resp 18   Ht 5\' 2"  (1.575 m)   Wt 145 lb (65.8 kg)   LMP 12/12/2016 (Exact Date)   SpO2 100%   BMI 26.52 kg/m   Physical Exam  Constitutional: She is oriented to person, place, and time. She appears well-developed and well-nourished.  HENT:  Head: Normocephalic.  Mouth/Throat: Oropharynx is clear and moist. No oropharyngeal exudate.  Eyes: Conjunctivae and EOM are normal. Pupils are equal, round, and reactive to light. Right eye exhibits no discharge. Left eye exhibits no discharge. No scleral icterus.  Neck: Normal range of motion. Neck supple. No JVD present. No tracheal deviation present.  Trachea is midline. No stridor or carotid bruits.   Cardiovascular: Normal rate, regular rhythm, normal heart sounds and intact distal pulses.   No murmur heard. RRR.   Pulmonary/Chest: Effort normal and breath sounds normal. No stridor. No respiratory distress. She has no wheezes. She has no rales.  Lungs CTA bilaterally.  Abdominal: Soft. Bowel sounds are normal. She exhibits no distension. There is no tenderness. There is no rebound and no guarding.  Musculoskeletal: Normal range of motion. She exhibits no edema or tenderness.  All compartments are soft. No palpable cords.   Lymphadenopathy:    She has no cervical adenopathy.  Neurological: She is alert and oriented to person, place, and time. She has normal reflexes. She displays normal reflexes. She exhibits normal muscle tone.  Skin: Skin is warm and dry. Capillary refill takes less than 2 seconds.  Psychiatric: She has a normal mood and affect. Her behavior is normal.  Nursing note and vitals reviewed.  ED Treatments / Results  DIAGNOSTIC STUDIES: Oxygen Saturation is 100% on RA, normal by my interpretation.   COORDINATION OF CARE: 1:03 AM-Discussed next steps with pt. Pt verbalized understanding and is agreeable with the plan.   Results for orders placed or performed during the hospital encounter of 12/31/16  CBC with  Differential/Platelet  Result Value Ref Range   WBC 15.4 (H) 4.0 - 10.5 K/uL   RBC 3.71 (L) 3.87 - 5.11 MIL/uL   Hemoglobin 11.5 (L) 12.0 - 15.0 g/dL   HCT 33.3 (L) 36.0 - 46.0 %   MCV 89.8 78.0 - 100.0 fL   MCH 31.0 26.0 - 34.0 pg   MCHC 34.5 30.0 - 36.0 g/dL   RDW 14.0 11.5 - 15.5 %   Platelets 316 150 - 400 K/uL   Neutrophils Relative % 78 %   Neutro Abs 11.9 (H) 1.7 -  7.7 K/uL   Lymphocytes Relative 16 %   Lymphs Abs 2.5 0.7 - 4.0 K/uL   Monocytes Relative 5 %   Monocytes Absolute 0.8 0.1 - 1.0 K/uL   Eosinophils Relative 1 %   Eosinophils Absolute 0.2 0.0 - 0.7 K/uL   Basophils Relative 0 %   Basophils Absolute 0.0 0.0 - 0.1 K/uL  I-Stat Chem 8, ED  Result Value Ref Range   Sodium 139 135 - 145 mmol/L   Potassium 3.4 (L) 3.5 - 5.1 mmol/L   Chloride 105 101 - 111 mmol/L   BUN 11 6 - 20 mg/dL   Creatinine, Ser 1.10 (H) 0.44 - 1.00 mg/dL   Glucose, Bld 122 (H) 65 - 99 mg/dL   Calcium, Ion 1.09 (L) 1.15 - 1.40 mmol/L   TCO2 21 0 - 100 mmol/L   Hemoglobin 11.6 (L) 12.0 - 15.0 g/dL   HCT 34.0 (L) 36.0 - 46.0 %  I-stat troponin, ED  Result Value Ref Range   Troponin i, poc 3.08 (HH) 0.00 - 0.08 ng/mL   Comment NOTIFIED PHYSICIAN    Comment 3          I-Stat Beta hCG blood, ED ([facility], [facility], [facility] only)  Result Value Ref Range   I-stat hCG, quantitative <5.0 <5 mIU/mL   Comment 3           Dg Chest 2 View  Result Date: 12/19/2016 CLINICAL DATA:  Mid chest pain today with pain radiating to the back. Pain 4 days ago with shortness of breath and hypertension. EXAM: CHEST  2 VIEW COMPARISON:  CT chest 11/12/2015.  Chest 09/13/2014 FINDINGS: The heart size and mediastinal contours are within normal limits. Both lungs are clear. The visualized skeletal structures are unremarkable. IMPRESSION: No active cardiopulmonary disease. Electronically Signed   By: Lucienne Capers M.D.   On: 12/19/2016 00:12   Dg Chest Portable 1 View  Result Date: 12/31/2016 CLINICAL DATA:  Initial evaluation  for acute chest pain. EXAM: PORTABLE CHEST 1 VIEW COMPARISON:  Prior radiograph from 12/18/2016. FINDINGS: The cardiac and mediastinal silhouettes are stable in size and contour, and remain within normal limits. The lungs are mildly hypoinflated. No airspace consolidation, pleural effusion, or pulmonary edema is identified. There is no pneumothorax. No acute osseous abnormality identified. IMPRESSION: No active cardiopulmonary disease. Electronically Signed   By: Jeannine Boga M.D.   On: 12/31/2016 00:56    EKG Interpretation  Date/Time:  Sunday December 31 2016 00:47:45 EDT Ventricular Rate:  74 PR Interval:    QRS Duration: 76 QT Interval:  428 QTC Calculation: 475 R Axis:   62 Text Interpretation:  Sinus rhythm t wave inversion inferiorly  Confirmed by [facility] [address]  MD, Vikki Gains ([id number]) on 12/31/2016 1:05:56 AM      Procedures Procedures   Medications Ordered in ED Medications  heparin ADULT infusion 100 units/mL (25000 units/22mL sodium chloride 0.45%) (not administered)  nitroGLYCERIN 50 mg in dextrose 5 % 250 mL (0.2 mg/mL) infusion (not administered)  fentaNYL (SUBLIMAZE) injection 50 mcg (not administered)   Final Clinical Impressions(s) / ED Diagnoses   Final diagnoses:  NSTEMI (non-ST elevated myocardial infarction) [facility])   This is a 46 y.o. -year-old female presents with crushing, substernal chest pain ongoing for the past 8 hours. EMS administered 324mg  ASA, sublingual NTG x 4, and 4mg  Zofran. No relief in pain. The patient is nontoxic-appearing on exam and vital signs are within normal limits. Initial troponin of 3.08. Will d/w cardiology.   1:13 AM-Discussed  case w/ Cardiology fellow, they are en route and will determine cath status.  Patient to cath lab     I personally performed the services described in this documentation, which was scribed in my presence. The recorded information has been reviewed and is accurate.       Veatrice Kells, MD 12/31/16 [phone]

## 2016-12-31 NOTE — H&P (Addendum)
History and Physical  Primary Cardiologist: Pixie Casino, MD   PCP: Reginia Forts, MD  Chief Complaint: Chest pain  HPI: Anna Henson is a 46 y.o. female with a h/o CAD (s/p DES to RCA in 09/2014), HTN, HLD, CKD, and former tobacco use who is coming complaining of chest pain. Patient reports 10/10 stubbing chest pain that started last evening, associated with chest pressure across her chest, dyspnea, diaphoresis, nausea. When EMS arrived her blood pressure was 220/108 mmHg. She received ASA 325 mg, nitro SL. In ED she was found to have TWI in inferior leads, troponin came back 3.08. She was started on heparin and nitro drip. Currently pain 5/10. She reports mild response to nitro. She reports pain is somewhat worse with inspiration. Not related to activity, position, and not worse with palpation. She reports she has been having chest pains for last few months. In January saw her Cardiologist and had stress test ordered that came back low risk. On 3/13 came to ED, but ECG was normal at the time, and troponin was negative. D-dimer was also negative at the time. She was diagnosed with precordial pain and sent home. She reports pains has been getting worse since then, on and off but more frequent. Pain since last night is persistent. She is followed in the clinic by Dr. Debara Pickett. She has significant cardiac history including PCI to RCA in 09/2014 ( (2.25 x 12 mm Promus Premier DES/ postdilated with 2.5 x 8 mm [place] balloon). Denies other recent symptoms.    Prior Studies  Past Medical History:  Diagnosis Date  . ACS (acute coronary syndrome) ([place]) 09/13/2014   Unstable anginal pain s/p 2.25 x 12 mm Promus Premier DES to the RCA  . CAD in native artery   . Chronic kidney disease    she sthinks has stage 2 CKD, has had US doppler and has some stenosis , this was done in [place]   . CKD (chronic kidney disease), stage II   . Hyperlipidemia   . Hypertension    > 5 years  . Tobacco abuse 09/13/2014      Past Surgical History:  Procedure Laterality Date  . CORONARY ANGIOPLASTY WITH STENT PLACEMENT     L main OK, LAD OK, CFX 30%, RCA 90%-0% w/ 2.25 x 12 mm Promus Premier DES, EF 55%  . HERNIA REPAIR    . LEFT HEART CATHETERIZATION WITH CORONARY ANGIOGRAM N/A 09/14/2014   Procedure: LEFT HEART CATHETERIZATION WITH CORONARY ANGIOGRAM;  Surgeon: Burnell Blanks, MD;  Location: [facility] CATH LAB;  Service: Cardiovascular;  Laterality: N/A;  . PERCUTANEOUS CORONARY STENT INTERVENTION (PCI-S)  09/14/2014   Procedure: PERCUTANEOUS CORONARY STENT INTERVENTION (PCI-S);  Surgeon: Burnell Blanks, MD;  Location: [facility] CATH LAB;  Service: Cardiovascular;;  . TONSILLECTOMY  2005   10 years ago    Family History  Problem Relation Age of Onset  . Diabetes Mother   . Heart disease Mother   . Hyperlipidemia Mother   . Hypertension Mother   . Lung cancer Father    Social History:  reports that she has quit smoking. Her smoking use included Cigarettes. She smoked 0.50 packs per day. She has never used smokeless tobacco. She reports that she does not drink alcohol or use drugs.  Allergies: No Known Allergies  Current Facility-Administered Medications on File Prior to Encounter  Medication Dose Route Frequency Provider Last Rate Last Dose  . gadopentetate dimeglumine (MAGNEVIST) injection 16 mL  16 mL Intravenous Once PRN  Britt Bottom, MD       Current Outpatient Prescriptions on File Prior to Encounter  Medication Sig Dispense Refill  . aspirin 81 MG chewable tablet Chew 1 tablet (81 mg total) by mouth daily.    Marland Kitchen atorvastatin (LIPITOR) 80 MG tablet TAKE 1 TABLET BY MOUTH EVERY DAY 30 tablet 0  . baclofen (LIORESAL) 10 MG tablet Take one po in AM, one po in PM and two po qHS (Patient taking differently: Take 5-10 mg by mouth See admin instructions. Take 1 tablet every morning and every evening then take 2 tablets at bedtime) 120 each 11  . fluticasone (FLONASE) 50 MCG/ACT nasal spray SHAKE  LIQUID AND USE 2 SPRAYS IN EACH NOSTRIL DAILY 16 g 5  . gabapentin (NEURONTIN) 300 MG capsule Take one pill in the morning, two in the afternoon and two po at bedtime (Patient taking differently: Take 300-600 mg by mouth See admin instructions. Take 1 capsule every morning then take 2 capsules in the afternoon and at bedtime) 150 capsule 11  . metoprolol (LOPRESSOR) 50 MG tablet TAKE 1 TABLET BY MOUTH TWICE DAILY. 90 tablet 1  . nitroGLYCERIN (NITROSTAT) 0.4 MG SL tablet Place 1 tablet (0.4 mg total) under the tongue every 5 (five) minutes as needed for chest pain. 25 tablet 1  . triamterene-hydrochlorothiazide (DYAZIDE) 37.5-25 MG capsule Take 1 each (1 capsule total) by mouth daily. 90 capsule 3  . atorvastatin (LIPITOR) 80 MG tablet TAKE 1 TABLET BY MOUTH EVERY DAY. DUE FOR AN OFFICE VISIT AND FASTING LAB (Patient not taking: Reported on 12/31/2016) 90 tablet 0  . cephALEXin (KEFLEX) 500 MG capsule Take 1 capsule (500 mg total) by mouth 3 (three) times daily. (Patient not taking: Reported on 12/19/2016) 21 capsule 0  . phenazopyridine (PYRIDIUM) 200 MG tablet Take 1 tablet (200 mg total) by mouth 3 (three) times daily as needed for pain. (Patient not taking: Reported on 12/19/2016) 10 tablet 0  . traMADol (ULTRAM) 50 MG tablet Take 1 tablet (50 mg total) by mouth 4 (four) times daily as needed. (Patient not taking: Reported on 12/31/2016) 120 tablet 4    _0 @ _1 @  Results for orders placed or performed during the hospital encounter of 12/31/16 (from the past 48 hour(s))  CBC with Differential/Platelet     Status: Abnormal   Collection Time: 12/31/16 12:43 AM  Result Value Ref Range   WBC 15.4 (H) 4.0 - 10.5 K/uL   RBC 3.71 (L) 3.87 - 5.11 MIL/uL   Hemoglobin 11.5 (L) 12.0 - 15.0 g/dL   HCT 33.3 (L) 36.0 - 46.0 %   MCV 89.8 78.0 - 100.0 fL   MCH 31.0 26.0 - 34.0 pg   MCHC 34.5 30.0 - 36.0 g/dL   RDW 14.0 11.5 - 15.5 %   Platelets 316 150 - 400 K/uL   Neutrophils Relative  % 78 %   Neutro Abs 11.9 (H) 1.7 - 7.7 K/uL   Lymphocytes Relative 16 %   Lymphs Abs 2.5 0.7 - 4.0 K/uL   Monocytes Relative 5 %   Monocytes Absolute 0.8 0.1 - 1.0 K/uL   Eosinophils Relative 1 %   Eosinophils Absolute 0.2 0.0 - 0.7 K/uL   Basophils Relative 0 %   Basophils Absolute 0.0 0.0 - 0.1 K/uL  Basic metabolic panel     Status: Abnormal   Collection Time: 12/31/16 12:43 AM  Result Value Ref Range   Sodium 136 135 - 145 mmol/L   Potassium 3.4 (L) 3.5 -  5.1 mmol/L   Chloride 106 101 - 111 mmol/L   CO2 20 (L) 22 - 32 mmol/L   Glucose, Bld 120 (H) 65 - 99 mg/dL   BUN 10 6 - 20 mg/dL   Creatinine, Ser 1.07 (H) 0.44 - 1.00 mg/dL   Calcium 8.9 8.9 - 10.3 mg/dL   GFR calc non Af Amer >60 >60 mL/min   GFR calc Af Amer >60 >60 mL/min    Comment: (NOTE) The eGFR has been calculated using the CKD EPI equation. This calculation has not been validated in all clinical situations. eGFR's persistently <60 mL/min signify possible Chronic Kidney Disease.    Anion gap 10 5 - 15  I-stat troponin, ED     Status: Abnormal   Collection Time: 12/31/16 12:50 AM  Result Value Ref Range   Troponin i, poc 3.08 (HH) 0.00 - 0.08 ng/mL   Comment NOTIFIED PHYSICIAN    Comment 3            Comment: Due to the release kinetics of cTnI, a negative result within the first hours of the onset of symptoms does not rule out myocardial infarction with certainty. If myocardial infarction is still suspected, repeat the test at appropriate intervals.   I-Stat Beta hCG blood, ED ([facility], [facility], [facility] only)     Status: None   Collection Time: 12/31/16 12:50 AM  Result Value Ref Range   I-stat hCG, quantitative <5.0 <5 mIU/mL   Comment 3            Comment:   GEST. AGE      CONC.  (mIU/mL)   <=1 WEEK        5 - 50     2 WEEKS       50 - 500     3 WEEKS       100 - 10,000     4 WEEKS     1,000 - 30,000        FEMALE AND NON-PREGNANT FEMALE:     LESS THAN 5 mIU/mL   I-Stat Chem 8, ED     Status: Abnormal    Collection Time: 12/31/16 12:52 AM  Result Value Ref Range   Sodium 139 135 - 145 mmol/L   Potassium 3.4 (L) 3.5 - 5.1 mmol/L   Chloride 105 101 - 111 mmol/L   BUN 11 6 - 20 mg/dL   Creatinine, Ser 1.10 (H) 0.44 - 1.00 mg/dL   Glucose, Bld 122 (H) 65 - 99 mg/dL   Calcium, Ion 1.09 (L) 1.15 - 1.40 mmol/L   TCO2 21 0 - 100 mmol/L   Hemoglobin 11.6 (L) 12.0 - 15.0 g/dL   HCT 34.0 (L) 36.0 - 46.0 %   Dg Chest Portable 1 View  Result Date: 12/31/2016 CLINICAL DATA:  Initial evaluation for acute chest pain. EXAM: PORTABLE CHEST 1 VIEW COMPARISON:  Prior radiograph from 12/18/2016. FINDINGS: The cardiac and mediastinal silhouettes are stable in size and contour, and remain within normal limits. The lungs are mildly hypoinflated. No airspace consolidation, pleural effusion, or pulmonary edema is identified. There is no pneumothorax. No acute osseous abnormality identified. IMPRESSION: No active cardiopulmonary disease. Electronically Signed   By: Jeannine Boga M.D.   On: 12/31/2016 00:56    ECG/Tele: NSR 74 bpm, TWI in III, AVF  ROS: As above. Otherwise, review of systems is negative unless per above HPI  Vitals:   12/31/16 0035 12/31/16 0115 12/31/16 0130 12/31/16 0145  BP:  (!) 172/88 Marland Kitchen)  163/93 (!) 182/92  Pulse:  72 71 75  Resp:  (!) _0 Temp:      TempSrc:      SpO2:  100% 100% 100%  Weight: 65.8 kg (145 lb)     Height: 5' 2" (1.575 m)      Wt Readings from Last 10 Encounters:  12/31/16 65.8 kg (145 lb)  12/18/16 65.8 kg (145 lb)  11/29/16 65.3 kg (144 lb)  11/14/16 66.2 kg (146 lb)  10/20/16 66.2 kg (146 lb)  08/21/16 68.3 kg (150 lb 8 oz)  07/03/16 68 kg (150 lb)  04/17/16 71 kg (156 lb 8 oz)  02/22/16 70.9 kg (156 lb 6.4 oz)  02/01/16 73 kg (161 lb)    PE:  General: Reports 5/10 chest pain HEENT: Atraumatic, EOMI, mucous membranes moist CV: RRR no murmurs, gallops. No JVD. No HJR. Respiratory: Clear, no crackles. Normal work of breathing ABD:  Non-distended and non-tender. No palpable organomegaly.  Extremities: 2+ radial pulses bilaterally. Trace edema. Neuro/Psych: CN grossly intact, alert and oriented  Assessment/Plan 1. NSTEMI - Patient with worsening chest pains over last few months, and now persistent pain since last night. ECG with new TWI in inferior leads. Troponin 3.08. Patient received ASA 325 mg, and was started on nitro and heparin drip. Still pain 5/10. History of PCI to RCA in 09/2014 (2.25 x 12 mm Promus Premier DES/ postdilated with 2.5 x 8 mm [place] balloon). 2. Uncontrolled blood pressure 3. Hypokalemia 4. Chronic kidney disease  Plan:  - Patient will be taken for emergent catheterization to the cath lab - Cycle cardiac enzymes, lipid panel, TSH, CRP - Echocardiogram - Will resume home medications including aspirin, metoprolol, dyazide - Will need adjustment of blood pressure medications before discharge - Potassium repletion with K-Dur 20 mEq bid - Further instructions after catheterization is done  Pacer Dorn Adela Lank  MD 12/31/2016, 2:03 AM

## 2016-12-31 NOTE — ED Notes (Signed)
Pt states she was seen last Tuesday here and diagnosed with PCS. Pt states she has been having persistent chest pain for the past week since discharge. Pt has not seen her cardiologist yet. Pt states the pain is midsternal with radiation to the back. Pt rates her pain an 8 out of 10.

## 2016-12-31 NOTE — ED Triage Notes (Signed)
[facility] brought pt in for chest pain. EMS states the patient was initially hypertensive on scene 220/108. EMS advised 12 lead unremarkable, lung sounds clear and equal bilaterally. EMS gave 3 nitro, 4 baby asa, and 4mg  of zofran. Vitals 154/88, P-68, R-18, Oxygen 99% RA.   Pt states she was seen last Tuesday here and diagnosed with PCS. Pt states she has been having persistent chest pain for the past week since discharge. Pt has not seen her cardiologist yet. Pt states the pain is midsternal with radiation to the back. Pt rates her pain an 8 out of 10.

## 2016-12-31 NOTE — Interval H&P Note (Signed)
History and Physical Interval Note:  12/31/2016 3:45 AM  Anna Henson  has presented today for surgery, with the diagnosis of STEMI  The various methods of treatment have been discussed with the patient and family. After consideration of risks, benefits and other options for treatment, the patient has consented to  Procedure(s): Left Heart Cath and Coronary Angiography (N/A) Coronary/Graft Acute MI Revascularization (N/A) as a surgical intervention .  The patient's history has been reviewed, patient examined, no change in status, stable for surgery.  I have reviewed the patient's chart and labs.  Questions were answered to the patient's satisfaction.     Sherren Mocha

## 2016-12-31 NOTE — Progress Notes (Addendum)
Progress Note  Patient Name: Anna Henson Date of Encounter: 12/31/2016  Primary Cardiologist: Dr. Debara Pickett  Subjective   Pt is feeling much better now. No chest pain or shortness of breath. She had headache, N&V earlier with elevated BP. BP is better now. Off NTG. Headache and nausea resolved.   Inpatient Medications    Scheduled Meds: . aspirin  81 mg Oral Daily  . atorvastatin  80 mg Oral q1800  . [START ON 01/01/2017] enoxaparin (LOVENOX) injection  40 mg Subcutaneous Q24H  . metoprolol  50 mg Oral BID  . pantoprazole  40 mg Oral BID  . potassium chloride  20 mEq Oral BID  . sodium chloride flush  3 mL Intravenous Q12H  . ticagrelor  90 mg Oral BID  . triamterene-hydrochlorothiazide  1 tablet Oral Daily   Continuous Infusions: . sodium chloride 1 mL/kg/hr (12/31/16 0425)   PRN Meds: sodium chloride, acetaminophen, hydrALAZINE, hydrALAZINE, labetalol, nitroGLYCERIN, ondansetron (ZOFRAN) IV, sodium chloride flush   Vital Signs    Vitals:   12/31/16 0623 12/31/16 0624 12/31/16 0630 12/31/16 0700  BP: (!) 168/90  (!) 178/92 (!) 150/91  Pulse: 63 71 (!) 58 68  Resp: (!) 9 18 12 15   Temp:      TempSrc:      SpO2: 100% 100% 100% 100%  Weight:      Height:        Intake/Output Summary (Last 24 hours) at 12/31/16 0805 Last data filed at 12/31/16 0620  Gross per 24 hour  Intake             83.7 ml  Output              100 ml  Net            -16.3 ml   Filed Weights   12/31/16 0035 12/31/16 0408  Weight: 145 lb (65.8 kg) 148 lb 5.9 oz (67.3 kg)    Telemetry    NSR rates in the 60's-70's - Personally Reviewed  ECG    NSR 64 bpm, inferior inverted T waves - Personally Reviewed  Physical Exam   GEN: No acute distress.   Neck: No JVD Cardiac: RRR, no murmurs, rubs, or gallops.  Respiratory: Clear to auscultation bilaterally. GI: Soft, nontender, non-distended  MS: No edema; No deformity. Neuro:  Nonfocal  Psych: Normal affect   Right wrist with TR band  in place, no inflated. No hematoma or tenderness.   Labs    Chemistry Recent Labs Lab 12/31/16 0043 12/31/16 0052 12/31/16 0643  NA 136 139 138  K 3.4* 3.4* 3.2*  CL 106 105 108  CO2 20*  --  21*  GLUCOSE 120* 122* 127*  BUN 10 11 6   CREATININE 1.07* 1.10* 0.94  CALCIUM 8.9  --  8.4*  PROT  --   --  5.8*  ALBUMIN  --   --  3.5  AST  --   --  98*  ALT  --   --  27  ALKPHOS  --   --  48  BILITOT  --   --  0.3  GFRNONAA >60  --  >60  GFRAA >60  --  >60  ANIONGAP 10  --  9     Hematology Recent Labs Lab 12/31/16 0043 12/31/16 0052 12/31/16 0643  WBC 15.4*  --  13.3*  RBC 3.71*  --  3.34*  HGB 11.5* 11.6* 10.4*  HCT 33.3* 34.0* 29.9*  MCV 89.8  --  89.5  MCH 31.0  --  31.1  MCHC 34.5  --  34.8  RDW 14.0  --  14.0  PLT 316  --  296    Cardiac Enzymes Recent Labs Lab 12/31/16 0643  TROPONINI 26.44*    Recent Labs Lab 12/31/16 0050  TROPIPOC 3.08*     BNPNo results for input(s): BNP, PROBNP in the last 168 hours.   DDimer No results for input(s): DDIMER in the last 168 hours.   Radiology    Dg Chest Portable 1 View  Result Date: 12/31/2016 CLINICAL DATA:  Initial evaluation for acute chest pain. EXAM: PORTABLE CHEST 1 VIEW COMPARISON:  Prior radiograph from 12/18/2016. FINDINGS: The cardiac and mediastinal silhouettes are stable in size and contour, and remain within normal limits. The lungs are mildly hypoinflated. No airspace consolidation, pleural effusion, or pulmonary edema is identified. There is no pneumothorax. No acute osseous abnormality identified. IMPRESSION: No active cardiopulmonary disease. Electronically Signed   By: Jeannine Boga M.D.   On: 12/31/2016 00:56    Cardiac Studies   Left Heart Cath and Coronary Angiography  12/31/16  Conclusion   1. Acute total occlusion of the RCA, treated successful with PCI using a 2.75x38 mm Promus DES, deployed in overlapping fashion with the old stent which was widely patent 2. Mild  nonobstructive LAD/LCx stenosis 3. Mild segmental LV systolic dysfunction with severe hypokinesis of the basal and mid inferior walls, but vigorous contractility of the anterior and apical walls, LVEF estimated 50-55%  Recommend:  DAPT with ASA and brilinta at least 12 months without interruption     Patient Profile     46 y.o. female CAD (s/p DES to RCA in 09/2014), HTN, HLD, CKD, and former tobacco use who is coming complaining of chest pain. EKG with TWI in inferior leads, troponin 3.08. She was started on heparin and nitro drip and taken to the cath lab.  Assessment & Plan    STEMI -History of PCI to RCA in 09/2014 (2.25 x 12 mm Promus Premier DES/ postdilated with 2.5 x 8 mm [place] balloon). -Patient with worsening chest pains over last few months, and now persistent pain since last night. ECG with new TWI in inferior leads. Troponin 3.08--> max 26.44. Patient received ASA 325 mg, and was started on nitro and heparin drip. -Taken to cath lab during the night with findings: acute total occlusion of the RCA, treated successfully with Promus DES, overlapping the old stent which was widely patent, mild non-occlusive LAD and Circ stenosis, EF 50-55%. Right wrist is stable. -Had issues with elevated BP, headache and vomiting small amount coffee ground emesis early this morning. NTG off now and pt currently comfortable and BP improved. Hgb 11.5-> 10.4. CBC in for 12n, will follow. PPI initiated.  -Plan for DAPT with aspirin and Brilinta for at least 12 months without interruption. Continue high intensity statin and BB -May be ready for discharge later this afternoon if BP and Hgb OK and no issues.   HTN -BP was elevated just after cath. Now improved with prn labetalol. -Continue BB and home Diazide.  -Continue to monitor  Hypokalemia -K+ 3.2. KDur 19mEq bid ordered.  CKD -SCr stable today- 0.94  HLD -Last LDL 10/25/15 76 -Continue atorvastatin 80 mg  Signed, Daune Perch, NP    12/31/2016, 8:05 AM    The patient was seen, examined and discussed with Daune Perch, NP-C and I agree with the above.   46 year old female with known CAD, s/p  PCI to RCA in 09/2014, admitted this am with STEMI sec to acute total occlusion of the RCA, treated successful with PCI using a 2.75x38 mm Promus DES, deployed in overlapping fashion with the old stent which was widely patent, mild nonobstructive LAD/LCx stenosis, mild segmental LV systolic dysfunction with severe hypokinesis of the basal and mid inferior walls, but vigorous contractility of the anterior and apical walls, LVEF estimated 50-55%. She continues to be nauseous with coffee ground emesis earlier this morning. We will give Zofran now and PRN Q4H as well as metoclopramide.  DAPT with ASA and brilinta at least 12 months without interruption. Troponin 26.44, we will repeat. ECG this am shows negative T waves in the inferior leads, unchanged from the admission ECG. Telemetry shows SR, both personally reviewed. K low, I will replace. Crea 0.94. Hb 11.5 --> 10.4. We will follow. BP is elevated, I will add lisinopril 5 mg po daily.  The patient was admitted this am, however required further attention for critical decision making, total time spent with the patient 36 minutes.  Ena Dawley, MD 12/31/2016

## 2017-01-01 ENCOUNTER — Encounter (HOSPITAL_COMMUNITY): Payer: Self-pay | Admitting: Cardiovascular Disease

## 2017-01-01 ENCOUNTER — Inpatient Hospital Stay (HOSPITAL_COMMUNITY): Payer: Federal, State, Local not specified - PPO

## 2017-01-01 DIAGNOSIS — I213 ST elevation (STEMI) myocardial infarction of unspecified site: Secondary | ICD-10-CM

## 2017-01-01 LAB — CBC
HCT: 32.2 % — ABNORMAL LOW (ref 36.0–46.0)
Hemoglobin: 11.3 g/dL — ABNORMAL LOW (ref 12.0–15.0)
MCH: 31.4 pg (ref 26.0–34.0)
MCHC: 35.1 g/dL (ref 30.0–36.0)
MCV: 89.4 fL (ref 78.0–100.0)
Platelets: 331 10*3/uL (ref 150–400)
RBC: 3.6 MIL/uL — ABNORMAL LOW (ref 3.87–5.11)
RDW: 13.9 % (ref 11.5–15.5)
WBC: 14 10*3/uL — ABNORMAL HIGH (ref 4.0–10.5)

## 2017-01-01 LAB — POCT ACTIVATED CLOTTING TIME
Activated Clotting Time: 290 seconds
Activated Clotting Time: 296 seconds

## 2017-01-01 LAB — ECHOCARDIOGRAM COMPLETE
Height: 62 in
Weight: 2373.91 oz

## 2017-01-01 MED ORDER — METOCLOPRAMIDE HCL 5 MG PO TABS
5.0000 mg | ORAL_TABLET | Freq: Three times a day (TID) | ORAL | Status: DC
  Administered 2017-01-01 – 2017-01-02 (×2): 5 mg via ORAL
  Filled 2017-01-01 (×2): qty 1

## 2017-01-01 MED ORDER — LISINOPRIL 5 MG PO TABS
5.0000 mg | ORAL_TABLET | Freq: Once | ORAL | Status: AC
  Administered 2017-01-01: 5 mg via ORAL
  Filled 2017-01-01: qty 1

## 2017-01-01 MED ORDER — LISINOPRIL 10 MG PO TABS
10.0000 mg | ORAL_TABLET | Freq: Every day | ORAL | Status: DC
  Administered 2017-01-02: 10 mg via ORAL
  Filled 2017-01-01: qty 1

## 2017-01-01 MED FILL — Tirofiban HCl in NaCl 0.9% IV Soln 5 MG/100ML (Base Equiv): INTRAVENOUS | Qty: 100 | Status: AC

## 2017-01-01 NOTE — Progress Notes (Signed)
Progress Note  Patient Name: Anna Henson Date of Encounter: 01/01/2017  Primary Cardiologist: Dr Debara Pickett  Subjective   Feeling better this morning. No chest pain or dyspnea. Nausea improved no vomiting.  Inpatient Medications    Scheduled Meds: . aspirin  81 mg Oral Daily  . atorvastatin  80 mg Oral q1800  . enoxaparin (LOVENOX) injection  40 mg Subcutaneous Q24H  . lisinopril  5 mg Oral Daily  . metoCLOPramide (REGLAN) injection  5 mg Intravenous Q8H  . metoprolol  50 mg Oral BID  . ondansetron (ZOFRAN) IV  8 mg Intravenous Q6H  . pantoprazole  40 mg Oral BID  . potassium chloride  20 mEq Oral BID  . potassium chloride  40 mEq Oral Daily  . sodium chloride flush  3 mL Intravenous Q12H  . ticagrelor  90 mg Oral BID  . triamterene-hydrochlorothiazide  1 tablet Oral Daily   Continuous Infusions:  PRN Meds: sodium chloride, acetaminophen, hydrALAZINE, nitroGLYCERIN, ondansetron (ZOFRAN) IV, sodium chloride flush   Vital Signs    Vitals:   01/01/17 0420 01/01/17 0600 01/01/17 0623 01/01/17 0800  BP: (!) 147/89 (!) 155/94  127/86  Pulse:      Resp: (!) 29 18 14 16   Temp: 99.1 F (37.3 C)     TempSrc: Oral Oral    SpO2: 100% 100%  100%  Weight:      Height:        Intake/Output Summary (Last 24 hours) at 01/01/17 0839 Last data filed at 01/01/17 0800  Gross per 24 hour  Intake           1732.8 ml  Output             1000 ml  Net            732.8 ml   Filed Weights   12/31/16 0035 12/31/16 0408  Weight: 145 lb (65.8 kg) 148 lb 5.9 oz (67.3 kg)    Telemetry    NSR with no significant arrhythmia - Personally Reviewed  ECG    NSR 62 bpm, ST-T changes c/w evolution after recent inferior MI - Personally Reviewed  Physical Exam  Alert oriented woman, pleasant, in NAD GEN: No acute distress.   Neck: No JVD Cardiac: RRR, no murmurs, rubs, or gallops.  Respiratory: Clear to auscultation bilaterally. GI: Soft, nontender, non-distended  MS: No edema; No  deformity. Right radial site clear Neuro:  Nonfocal  Psych: Normal affect   Labs    Chemistry Recent Labs Lab 12/31/16 0043 12/31/16 0052 12/31/16 0643  NA 136 139 138  K 3.4* 3.4* 3.2*  CL 106 105 108  CO2 20*  --  21*  GLUCOSE 120* 122* 127*  BUN 10 11 6   CREATININE 1.07* 1.10* 0.94  CALCIUM 8.9  --  8.4*  PROT  --   --  5.8*  ALBUMIN  --   --  3.5  AST  --   --  98*  ALT  --   --  27  ALKPHOS  --   --  48  BILITOT  --   --  0.3  GFRNONAA >60  --  >60  GFRAA >60  --  >60  ANIONGAP 10  --  9     Hematology Recent Labs Lab 12/31/16 0643 12/31/16 1036 01/01/17 0207  WBC 13.3* 14.0* 14.0*  RBC 3.34* 3.54* 3.60*  HGB 10.4* 10.9* 11.3*  HCT 29.9* 31.6* 32.2*  MCV 89.5 89.3 89.4  MCH 31.1 30.8  31.4  MCHC 34.8 34.5 35.1  RDW 14.0 13.6 13.9  PLT 296 296 331    Cardiac Enzymes Recent Labs Lab 12/31/16 0643 12/31/16 1036 12/31/16 1603 12/31/16 2248  TROPONINI 26.44* 16.83* 13.43* 14.39*    Recent Labs Lab 12/31/16 0050  TROPIPOC 3.08*     BNPNo results for input(s): BNP, PROBNP in the last 168 hours.   DDimer No results for input(s): DDIMER in the last 168 hours.   Radiology    Dg Chest Portable 1 View  Result Date: 12/31/2016 CLINICAL DATA:  Initial evaluation for acute chest pain. EXAM: PORTABLE CHEST 1 VIEW COMPARISON:  Prior radiograph from 12/18/2016. FINDINGS: The cardiac and mediastinal silhouettes are stable in size and contour, and remain within normal limits. The lungs are mildly hypoinflated. No airspace consolidation, pleural effusion, or pulmonary edema is identified. There is no pneumothorax. No acute osseous abnormality identified. IMPRESSION: No active cardiopulmonary disease. Electronically Signed   By: Jeannine Boga M.D.   On: 12/31/2016 00:56    Cardiac Studies   2D Echo pending  Patient Profile     46 y.o. female with hx of CAD (s/p DES to RCA in 09/2014), HTN, HLD, CKD, and former tobacco use who is coming complaining  of chest pain. EKG with TWI in inferior leads, troponin 3.08. She was started on heparin and nitro drip and taken to the cath lab, found to have total occlusion of the RCA, treated with a DES platform.  Assessment & Plan    1. NSTEMI, total occlusion of the RCA, treated with PCI (DES platform). Doing well. EKG shows typical changes of recent MI. No further anginal symptoms. Continue ASA, brilinta, high-intensity statin, beta-blocker. Tx tele today, anticipate discharge home tomorrow.  2. HTN: continue beta-blocker, dyazide. Increase lisinopril to 10 mg daily.  3. Hypokalemia: replete potassium (oral supplementation). Recheck lab in am.  Dispo: tx tele today, anticipate discharge home tomorrow.  Anna James, MD  01/01/2017, 8:39 AM

## 2017-01-01 NOTE — Progress Notes (Signed)
  Echocardiogram 2D Echocardiogram has been performed.  Anna Henson 01/01/2017, 9:24 AM

## 2017-01-01 NOTE — Progress Notes (Signed)
CARDIAC REHAB PHASE I   PRE:  Rate/Rhythm: 45 SR  BP:  Supine:   Sitting: 139/96  Standing:    SaO2: 100%RA  MODE:  Ambulation: 330 ft   POST:  Rate/Rhythm: 74 SR  BP:  Supine: 155/87  Sitting:   Standing:    SaO2: 96%RA 1000-1100 Pt walked 330 ft with slow pace, stopping several times to rest due to legs sore and weak. No CP. To bed after walk. MI education completed with pt who voiced understanding. Stressed importance of brilinta with stent. Needs to see case manager for brilinta card. Reviewed NTG use, MI restrictions, risk factors, ex ed, heart healthy diet and CRP 2.  Will refer to [place] CRP 2.   Graylon Good, RN BSN  01/01/2017 10:54 AM

## 2017-01-02 ENCOUNTER — Ambulatory Visit: Payer: Federal, State, Local not specified - PPO | Admitting: Internal Medicine

## 2017-01-02 DIAGNOSIS — Z9889 Other specified postprocedural states: Secondary | ICD-10-CM

## 2017-01-02 LAB — BASIC METABOLIC PANEL
Anion gap: 10 (ref 5–15)
BUN: 8 mg/dL (ref 6–20)
CO2: 22 mmol/L (ref 22–32)
Calcium: 9.8 mg/dL (ref 8.9–10.3)
Chloride: 103 mmol/L (ref 101–111)
Creatinine, Ser: 1.39 mg/dL — ABNORMAL HIGH (ref 0.44–1.00)
GFR calc Af Amer: 53 mL/min — ABNORMAL LOW (ref >60)
GFR calc non Af Amer: 45 mL/min — ABNORMAL LOW (ref >60)
Glucose, Bld: 94 mg/dL (ref 65–99)
Potassium: 3.8 mmol/L (ref 3.5–5.1)
Sodium: 135 mmol/L (ref 135–145)

## 2017-01-02 MED ORDER — TICAGRELOR 90 MG PO TABS: 90.0000 mg | ORAL_TABLET | Freq: Two times a day (BID) | ORAL | 0 refills | Status: DC

## 2017-01-02 MED ORDER — TICAGRELOR 90 MG PO TABS: 90.0000 mg | ORAL_TABLET | Freq: Two times a day (BID) | ORAL | 3 refills | Status: DC

## 2017-01-02 MED ORDER — PANTOPRAZOLE SODIUM 40 MG PO TBEC: 40.0000 mg | DELAYED_RELEASE_TABLET | Freq: Two times a day (BID) | ORAL | 6 refills | Status: DC

## 2017-01-02 MED ORDER — LISINOPRIL 10 MG PO TABS: 10.0000 mg | ORAL_TABLET | Freq: Every day | ORAL | 3 refills | Status: DC

## 2017-01-02 NOTE — Care Management Note (Signed)
Case Management Note Marvetta Gibbons RN, BSN Unit 2W-Case Manager [phone]  Patient Details  Name: Anna Henson MRN: [id number] Date of Birth: [birth year]  Subjective/Objective:   Pt admitted with NSTEMI- tx from 4N to 2W on 3/26                 Action/Plan: PTA pt lived at home was independent- plan to return home- referral received for Brilinta needs- insurance check completed- pt has not met deductible- copay cost $210.35 until deductible met- spoke with pt at bedside and informed of coverage and deductible - pt also given 30 day free card to use on discharge along with copay assist card. Per pt she uses [employer].   Expected Discharge Date:  01/02/17               Expected Discharge Plan:  Home/Self Care  In-House Referral:     Discharge planning Services  CM Consult, Medication Assistance  Post Acute Care Choice:  NA Choice offered to:  NA  DME Arranged:    DME Agency:     HH Arranged:    HH Agency:     Status of Service:  Completed, signed off  If discussed at [employer] of Stay Meetings, dates discussed:    Additional Comments:  Dawayne Patricia, RN 01/02/2017, 11:35 AM

## 2017-01-02 NOTE — Discharge Instructions (Signed)
No driving for 5 days. No lifting over 5 lbs for 1 week. No sexual activity for 1 week. You may return to work on 5-7 days. Keep procedure site clean & dry. If you notice increased pain, swelling, bleeding or pus, call/return!  You may shower, but no soaking baths/hot tubs/pools for 1 week.

## 2017-01-02 NOTE — Discharge Summary (Signed)
Discharge Summary    Patient ID: Anna Henson,  MRN: [id number], DOB/AGE: December 21, 1970 46 y.o.  Admit date: 12/31/2016 Discharge date: 01/02/2017  Primary Care Provider: [facility] Primary Cardiologist: Dr. Debara Pickett   Discharge Diagnoses    Principal Problem:   NSTEMI (non-ST elevated myocardial infarction) [facility]) Active Problems:   ST elevation myocardial infarction involving right coronary artery (HCC)   Abnormal EKG   Nausea   Ischemic cardiomyopathy   S/P cardiac cath (12/2016) a. acute total occlusion of the RCA tx w/PCI DES, b. mild nonobs LAD/LCx stenosis, c. mild segmental LV sys dx w/ sev hypokinesis, LVEF 50-55%  Allergies No Known Allergies   History of Present Illness       46 y.o. female with hx of CAD (s/p DES to RCA in 09/2014), HTN, HLD, CKD, and former tobacco use who is coming complaining of chest pain. EKG with TWI in inferior leads, troponin 3.08. She was started on heparin and nitro drip and taken to the cath lab, found to have total occlusion of the RCA, treated with a DES platform.   Hospital Course     Consultants: None  The patients pain became persistent and was accompanied by uncontrolled blood pressure, hypokalemia, and chronic kidney disease. Troponin 3.08--> max 26.44. She received aspirin and was started on Heparin and nitro drip. She was taken for emergent catheterization with findings of acute total occlusion of the RCA, treated successfully with Promus DES, overlapping the old stent which was widely patent, mild non-occlusive LAD and Circ stenosis, EF 50-55%.Had issues with elevated BP, headache and vomiting small amount coffee ground emesis in the morning.  Plan for DAPT with aspirin and Brilinta for at least 12 months without interruption. Continue high intensity statin and BB. She was monitored an extra day on telemetry insure stable, cardiac rehab ambulated her and she did well. Potassium was rechecked and it is 3.8. She will continue ob her  BB, dyazide and Lisinopril will be increased to 10 mg daily.  The right radial catheter site is stable She has been seen by Dr. Burt Knack today and deemed ready for discharge home. All follow-up appointments have been scheduled. Continued smoking cessation was disscussed in length. A written RX for a 30 day free supply of Brilinta was provided for the patient. A work excuse note was provided as well. Discharge medications are listed below.  _____________  Discharge Vitals Blood pressure 131/80, pulse 76, temperature 98.6 F (37 C), temperature source Oral, resp. rate 18, height 5\' 2"  (1.575 m), weight 148 lb 5.9 oz (67.3 kg), last menstrual period 12/12/2016, SpO2 100 %.  Filed Weights   12/31/16 0035 12/31/16 0408  Weight: 145 lb (65.8 kg) 148 lb 5.9 oz (67.3 kg)    Labs & Radiologic Studies     CBC  Recent Labs  12/31/16 0043  12/31/16 1036 01/01/17 0207  WBC 15.4*  < > 14.0* 14.0*  NEUTROABS 11.9*  --   --   --   HGB 11.5*  < > 10.9* 11.3*  HCT 33.3*  < > 31.6* 32.2*  MCV 89.8  < > 89.3 89.4  PLT 316  < > 296 331  < > = values in this interval not displayed. Basic Metabolic Panel  Recent Labs  12/31/16 0643 01/02/17 0354  NA 138 135  K 3.2* 3.8  CL 108 103  CO2 21* 22  GLUCOSE 127* 94  BUN 6 8  CREATININE 0.94 1.39*  CALCIUM 8.4* 9.8   Liver  Function Tests  Recent Labs  12/31/16 0643  AST 98*  ALT 27  ALKPHOS 48  BILITOT 0.3  PROT 5.8*  ALBUMIN 3.5   Cardiac Enzymes  Recent Labs  12/31/16 1036 12/31/16 1603 12/31/16 2248  TROPONINI 16.83* 13.43* 14.39*    Recent Labs  12/31/16 1036  CHOL 139  HDL 36*  LDLCALC 83  TRIG 100  CHOLHDL 3.9   Thyroid Function Tests  Recent Labs  12/31/16 1603  TSH 1.648    Dg Chest 2 View Result Date: 12/19/2016 No active cardiopulmonary disease.   Dg Chest Portable 1 View Result Date: 12/31/2016  No active cardiopulmonary disease.  Diagnostic Studies/Procedures    12/31/2016 Coronary/Graft Acute  MI Revascularization  Left Heart Cath and Coronary Angiography  Conclusion   1. Acute total occlusion of the RCA, treated successful with PCI using a 2.75x38 mm Promus DES, deployed in overlapping fashion with the old stent which was widely patent 2. Mild nonobstructive LAD/LCx stenosis 3. Mild segmental LV systolic dysfunction with severe hypokinesis of the basal and mid inferior walls, but vigorous contractility of the anterior and apical walls, LVEF estimated 50-55%  Recommend:  DAPT with ASA and brilinta at least 12 months without interruption   Transthoracic Echocardiography 01/01/2017  Study Conclusions - Left ventricle: The cavity size was normal. There was moderate   concentric hypertrophy. Systolic function was vigorous. The   estimated ejection fraction was in the range of 65% to 70%. There   was dynamic obstruction at rest, with a peak velocity of 291   cm/sec and a peak gradient of 34 mm Hg. Wall motion was normal;   there were no regional wall motion abnormalities. Doppler   parameters are consistent with abnormal left ventricular   relaxation (grade 1 diastolic dysfunction). Doppler parameters   are consistent with high ventricular filling pressure. - Aortic valve: Transvalvular velocity was within the normal range.   There was no stenosis. There was no regurgitation. - Mitral valve: Transvalvular velocity was within the normal range.   There was no evidence for stenosis. There was no regurgitation. - Right ventricle: The cavity size was normal. Wall thickness was   normal. Systolic function was normal. - Atrial septum: No defect or patent foramen ovale was identified   by color flow Doppler. - Tricuspid valve: There was trivial regurgitation. - Pulmonary arteries: Systolic pressure was within the normal   range. PA peak pressure: 15 mm Hg (S).  __________   Disposition   Pt is being discharged home today in good condition.  Follow-up Plans & Appointments     Follow-up Information    Murray Hodgkins, NP Follow up on 01/10/2017.   Specialties:  Nurse Practitioner, Cardiology, Radiology Why:  Appointment at 2pm, please arrive 15 minutes early. Contact information: [address] [place] [address] [phone]          Discharge Instructions    Amb Referral to Cardiac Rehabilitation    Complete by:  As directed    Referring to [facility] Phase 2   Diagnosis:   NSTEMI Coronary Stents        Discharge Medications   Allergies as of 01/02/2017   No Known Allergies     Medication List    STOP taking these medications   cephALEXin 500 MG capsule Commonly known as:  KEFLEX     TAKE these medications   aspirin 81 MG chewable tablet Chew 1 tablet (81 mg total) by mouth daily.  atorvastatin 80 MG tablet Commonly known as:  LIPITOR TAKE 1 TABLET BY MOUTH EVERY DAY What changed:  Another medication with the same name was removed. Continue taking this medication, and follow the directions you see here.   baclofen 10 MG tablet Commonly known as:  LIORESAL Take one po in AM, one po in PM and two po qHS What changed:  how much to take  how to take this  when to take this  additional instructions   fluticasone 50 MCG/ACT nasal spray Commonly known as:  FLONASE SHAKE LIQUID AND USE 2 SPRAYS IN EACH NOSTRIL DAILY   gabapentin 300 MG capsule Commonly known as:  NEURONTIN Take one pill in the morning, two in the afternoon and two po at bedtime What changed:  how much to take  how to take this  when to take this  additional instructions   lisinopril 10 MG tablet Commonly known as:  PRINIVIL,ZESTRIL Take 1 tablet (10 mg total) by mouth daily. Start taking on:  01/03/2017   metoprolol 50 MG tablet Commonly known as:  LOPRESSOR TAKE 1 TABLET BY MOUTH TWICE DAILY.   nitroGLYCERIN 0.4 MG SL tablet Commonly known as:  NITROSTAT Place 1 tablet (0.4 mg total) under the tongue every 5 (five)  minutes as needed for chest pain.   pantoprazole 40 MG tablet Commonly known as:  PROTONIX Take 1 tablet (40 mg total) by mouth 2 (two) times daily.   phenazopyridine 200 MG tablet Commonly known as:  PYRIDIUM Take 1 tablet (200 mg total) by mouth 3 (three) times daily as needed for pain.   ticagrelor 90 MG Tabs tablet Commonly known as:  BRILINTA Take 1 tablet (90 mg total) by mouth 2 (two) times daily.   ticagrelor 90 MG Tabs tablet Commonly known as:  BRILINTA Take 1 tablet (90 mg total) by mouth 2 (two) times daily.   traMADol 50 MG tablet Commonly known as:  ULTRAM Take 1 tablet (50 mg total) by mouth 4 (four) times daily as needed.   triamterene-hydrochlorothiazide 37.5-25 MG capsule Commonly known as:  DYAZIDE Take 1 each (1 capsule total) by mouth daily.        Aspirin prescribed at discharge?  Yes High Intensity Statin Prescribed? (Lipitor 40-80mg  or Crestor 20-40mg ): Yes Beta Blocker Prescribed? Yes For EF 45% or less, Was ACEI/ARB Prescribed? No: EF > 45% ADP Receptor Inhibitor Prescribed? (i.e. Plavix etc.-Includes Medically Managed Patients): Yes For EF <45%, Aldosterone Inhibitor Prescribed? No: EF > 45% Was EF assessed during THIS hospitalization? Yes Was Cardiac Rehab II ordered? (Included Medically managed Patients): Yes   Outstanding Labs/Studies   Bmp. check potassium  Duration of Discharge Encounter   Greater than 30 minutes including physician time.  Kristopher Glee PA-C 01/02/2017, 10:37 AM  Patient seen, examined. Available data reviewed. Agree with findings, assessment, and plan as outlined by Delos Haring, PA-C. The patient is independently interviewed and examined. Exam reveals an alert oriented woman in no distress, JVP is normal, lungs are clear, heart is regular rate and rhythm without murmur or gallop, abdomen is soft and nontender with no masses, extremities are without edema, the right radial site is clear.  Telemetry is  reviewed and shows no significant arrhythmia. The patient is in normal sinus rhythm. Laboratory data is reviewed. Her medical program is reviewed and she is on appropriate therapy with aspirin, brilinta, I intensity statin drug, beta blocker, and an ACE inhibitor. Follow-up is arranged as above. The patient is medically stable for  discharge home.  Sherren Mocha, M.D. 01/02/2017 10:40 AM

## 2017-01-02 NOTE — Progress Notes (Signed)
CARDIAC REHAB PHASE I   PRE:  Rate/Rhythm: 73 SR    BP: sitting     SaO2:   MODE:  Ambulation: 250 ft   POST:  Rate/Rhythm: 76 SR    BP: sitting 140/86     SaO2:   Tolerated well, no c/o. Feels well. Reviewed ed.  [place], ACSM 01/02/2017 11:16 AM

## 2017-01-02 NOTE — Progress Notes (Signed)
Per Clinical biochemist for [employer] [facility] @ [facility] # [phone]   BRILINTA 90 MG BID 30/60   COVER- YES  CO-PAY- $ 210.35 DEDUCTIBLE  NOT MET  TIER- 3 DRUG  PRIOR APPROVAL- NO   [facility]: [facility]  90 DAY SUPPLY @ [facility] $590.81

## 2017-01-04 ENCOUNTER — Telehealth: Payer: Self-pay | Admitting: Physician Assistant

## 2017-01-04 NOTE — Telephone Encounter (Signed)
    Patient called on call service bc BPs have been running low today. Last reading was 92/68 with HR of 106. She said she has been eating and drinking okay and feels well otherwise. She denies dizziness or palpitations. No hx of afib. I have told her to skip her PM Lopressor 50mg  dose tonight. I have also asked her to drink some fluids. If he BP remains low tomorrow, I told her to cut lisinopril from 10mg  to 5mg . She will keep a log of her BPs until she see Ignacia Bayley NP on 01/10/17.  Angelena Form PA-C  [facility]

## 2017-01-05 ENCOUNTER — Telehealth: Payer: Self-pay | Admitting: Internal Medicine

## 2017-01-05 NOTE — Telephone Encounter (Signed)
Patient spoke with K. Thompson PA on 3/29 She has appt 4/4 w/C. Sharolyn Douglas, NP

## 2017-01-05 NOTE — Telephone Encounter (Signed)
Pt notified she will continue to take BP and call if any new sx develop or BP is out of current range and will keep appt

## 2017-01-05 NOTE — Telephone Encounter (Signed)
Have patient re-start metoprolol tonight with only 25 mg and continue 25 mg bid.  Restart lisinopril on Saturday at 5 mg daily instead of 10 mg.  Continue to monitor home BP and hold lisinopril dose for any systolic BP < 031.

## 2017-01-05 NOTE — Telephone Encounter (Signed)
Spoke with pt states that she called in last night spoke with a nurse she was told to skip her PM Lopressor 50mg  dose and that told her if her BP remains low tomorrow, cut lisinopril from 10mg  to 5mg . Pt states that her BP is 99/70 HR 83 she is wondering if she should hold the lisinopril today, please advise

## 2017-01-05 NOTE — Telephone Encounter (Signed)
Anna Henson is calling because she has not heard back about anything since this morning . Please call

## 2017-01-05 NOTE — Telephone Encounter (Signed)
Agree, thanks.  Dr. Lemmie Evens

## 2017-01-05 NOTE — Telephone Encounter (Signed)
New message    Pt c/o BP issue: STAT if pt c/o blurred vision, one-sided weakness or slurred speech  1. What are your last 5 BP readings? 99/70 p-83-this morning, 92/69 p-106-last night  2. Are you having any other symptoms (ex. Dizziness, headache, blurred vision, passed out)? Pt states she feels weak and sometimes dizzy  3. What is your BP issue? Pt states her BP is low and she has not taken her medication. She is concerned to take it.

## 2017-01-07 ENCOUNTER — Telehealth: Payer: Self-pay | Admitting: Cardiology

## 2017-01-07 NOTE — Telephone Encounter (Signed)
Paged because patient "feels her heart racing" while in her sleep. However, her vital signs are normal when checked. 131/80 pulse of 80. She has active symptoms during these VS checks. She also has not pain, but "something I can feel" in her chest.  She "just feels like she is racing." Feels worse at night. Also has some SOB and stomach discomfort.   These symptoms are different compared to her MI presentation symptoms where it felt like she was "being stabbed in the chest."  We discussed that since she has a normal HR with her sensation of heart racing there may not be a rhythm explanation for the symptoms. It is possible she is having occasional symptomatic PVCs, but nothing that would likely be life threatending or require her to come to the ER overnight.   She has an appt on 4/4.  If she continues to have these symptoms could consider ordering a formal Holter monitor. Recommended relaxation techniques as there is almost certainly an anxiety component here. All questions answered.

## 2017-01-10 ENCOUNTER — Encounter: Payer: Self-pay | Admitting: Nurse Practitioner

## 2017-01-10 ENCOUNTER — Ambulatory Visit (INDEPENDENT_AMBULATORY_CARE_PROVIDER_SITE_OTHER): Payer: Federal, State, Local not specified - PPO | Admitting: Nurse Practitioner

## 2017-01-10 VITALS — BP 100/62 | HR 65 | Ht 62.0 in | Wt 138.0 lb

## 2017-01-10 DIAGNOSIS — N179 Acute kidney failure, unspecified: Secondary | ICD-10-CM | POA: Diagnosis not present

## 2017-01-10 DIAGNOSIS — R002 Palpitations: Secondary | ICD-10-CM

## 2017-01-10 DIAGNOSIS — E785 Hyperlipidemia, unspecified: Secondary | ICD-10-CM | POA: Diagnosis not present

## 2017-01-10 DIAGNOSIS — I10 Essential (primary) hypertension: Secondary | ICD-10-CM | POA: Diagnosis not present

## 2017-01-10 DIAGNOSIS — I952 Hypotension due to drugs: Secondary | ICD-10-CM

## 2017-01-10 DIAGNOSIS — I2510 Atherosclerotic heart disease of native coronary artery without angina pectoris: Secondary | ICD-10-CM | POA: Diagnosis not present

## 2017-01-10 DIAGNOSIS — I214 Non-ST elevation (NSTEMI) myocardial infarction: Secondary | ICD-10-CM | POA: Diagnosis not present

## 2017-01-10 LAB — BASIC METABOLIC PANEL
BUN: 13 mg/dL (ref 7–25)
CO2: 24 mmol/L (ref 20–31)
Calcium: 10.2 mg/dL (ref 8.6–10.2)
Chloride: 101 mmol/L (ref 98–110)
Creat: 1.41 mg/dL — ABNORMAL HIGH (ref 0.50–1.10)
Glucose, Bld: 82 mg/dL (ref 65–99)
Potassium: 3.8 mmol/L (ref 3.5–5.3)
Sodium: 138 mmol/L (ref 135–146)

## 2017-01-10 MED ORDER — LISINOPRIL 2.5 MG PO TABS: 2.5000 mg | ORAL_TABLET | Freq: Every day | ORAL | 3 refills | Status: DC

## 2017-01-10 MED ORDER — METOPROLOL TARTRATE 50 MG PO TABS: 25.0000 mg | ORAL_TABLET | Freq: Two times a day (BID) | ORAL | 1 refills | Status: DC

## 2017-01-10 NOTE — Patient Instructions (Signed)
Medication Instructions:   DECREASE LISINOPRIL TO 2.5 MG ONCE DAILY  Labwork:  Your physician recommends that you HAVE LAB WORK TODAY  Testing/Procedures:  Your physician has recommended that you wear an event monitor. Event monitors are medical devices that record the heart's electrical activity. Doctors most often [place] these monitors to diagnose arrhythmias. Arrhythmias are problems with the speed or rhythm of the heartbeat. The monitor is a small, portable device. You can wear one while you do your normal daily activities. This is usually used to diagnose what is causing palpitations/syncope (passing out).    Follow-Up:  Your physician recommends that you schedule a follow-up appointment in: [place]  If you need a refill on your cardiac medications before your next appointment, please call your pharmacy.

## 2017-01-10 NOTE — Progress Notes (Signed)
Office Visit    Patient Name: Anna Henson Date of Encounter: 01/10/2017  Primary Care Provider:  Reginia Forts, MD Primary Cardiologist:  C. Hilty, MD   Chief Complaint    46 year old female with a history of CAD status post recent non-STEMI, who presents for follow-up.  Past Medical History    Past Medical History:  Diagnosis Date  . CAD in native artery    a. 09/2014 Cath/PCI in setting of [place] - s/p 2.25 x 12 mm Promus Premier DES to the RCA;  b. 11/2016 Myoview: EF 56%, no ischemia;  c. 12/2016 NSTEMI/PCI: LM nl, LAd 25p, LCX 30p, RCA 100p (2.75x38 Promus Premier DES), mRCA 10 ISR, EF 50-55%.  . Chronic kidney disease    she sthinks has stage 2 CKD, has had US doppler and has some stenosis , this was done in [place]   . CKD (chronic kidney disease), stage II   . HOCM (hypertrophic obstructive cardiomyopathy) ([place])    a. 12/2016 Echo: EF 65-70%,  mod conc LVH, dynamic obstruction @ rest, peak velocity of 291 cm/sec w/ peak gradient of 66mmHg, no rwma, Gr1 DD, triv TR, PASP 22mmHg.  Marland Kitchen Hyperlipidemia   . Hypertension    > 5 years  . Palpitations   . Tobacco abuse 09/13/2014   Past Surgical History:  Procedure Laterality Date  . CORONARY ANGIOPLASTY WITH STENT PLACEMENT     L main OK, LAD OK, CFX 30%, RCA 90%-0% w/ 2.25 x 12 mm Promus Premier DES, EF 55%  . CORONARY/GRAFT ACUTE MI REVASCULARIZATION N/A 12/31/2016   Procedure: Coronary/Graft Acute MI Revascularization;  Surgeon: Sherren Mocha, MD;  Location: [place] [facility];  Service: Cardiovascular;  Laterality: N/A;  . HERNIA REPAIR    . LEFT HEART CATH AND CORONARY ANGIOGRAPHY N/A 12/31/2016   Procedure: Left Heart Cath and Coronary Angiography;  Surgeon: Sherren Mocha, MD;  Location: [place] [facility];  Service: Cardiovascular;  Laterality: N/A;  . LEFT HEART CATHETERIZATION WITH CORONARY ANGIOGRAM N/A 09/14/2014   Procedure: LEFT HEART CATHETERIZATION WITH CORONARY ANGIOGRAM;  Surgeon: Burnell Blanks, MD;   Location: [facility] CATH LAB;  Service: Cardiovascular;  Laterality: N/A;  . PERCUTANEOUS CORONARY STENT INTERVENTION (PCI-S)  09/14/2014   Procedure: PERCUTANEOUS CORONARY STENT INTERVENTION (PCI-S);  Surgeon: Burnell Blanks, MD;  Location: [facility] CATH LAB;  Service: Cardiovascular;;  . TONSILLECTOMY  2005   10 years ago    Allergies  No Known Allergies  History of Present Illness    46 year old female with prior history of coronary artery disease status post right coronary artery stenting in December 2015. Other history includes hypertension, hyperlipidemia, stage II kidney disease, and tobacco abuse. She had recently been experiencing intermittent chest discomfort which was somewhat atypical. She underwent stress testing in February which was nonischemic and low risk. Unfortunate, she continued to have intermittent chest discomfort, mostly at night and sometimes lasting hours at a time. She was seen in the emergency department in mid March with negative cardiac markers and she was sent home. Unfortunate, she had worsening of chest pain and more persistent chest pain on March 25, prompting her to present back to the emergency department. There, ECG showed inferior T-wave inversion and troponin was elevated at 26.44. She was admitted and underwent diagnostic catheterization revealing total occlusion of the proximal RCA. This was treated with drug-eluting stent. Previously placed mid RCA stent was patent. Echo showed normal LV function with dynamic obstruction at rest. She was maintained on beta blocker, ACE  inhibitor, aspirin, statin, and Brilinta therapy and subsequently discharged.  Since her discharge, she has done really well. She is not been having much in the way of chest pain but has noticed some lightheadedness in the setting of blood pressures in the 90s to low 100s. She has also been noticing occasional tachypalpitations, most likely to occur during the night and wake her from sleep, lasting  5-10 minutes, resolving spontaneously. She denies PND, orthopnea, syncope, edema, dyspnea, or early satiety.  Home Medications    Prior to Admission medications   Medication Sig Start Date End Date Taking? Authorizing Provider  aspirin 81 MG chewable tablet Chew 1 tablet (81 mg total) by mouth daily. 09/16/14  Yes Nita Sells, MD  atorvastatin (LIPITOR) 80 MG tablet TAKE 1 TABLET BY MOUTH EVERY DAY 08/25/16  Yes Wendie Agreste, MD  baclofen (LIORESAL) 10 MG tablet Take one po in AM, one po in PM and two po qHS Patient taking differently: Take 5-10 mg by mouth See admin instructions. Take 1 tablet every morning and every evening then take 2 tablets at bedtime 04/17/16  Yes Britt Bottom, MD  fluticasone (FLONASE) 50 MCG/ACT nasal spray SHAKE LIQUID AND USE 2 SPRAYS IN EACH NOSTRIL DAILY 12/04/16  Yes Chelle Jeffery, PA-C  gabapentin (NEURONTIN) 300 MG capsule Take one pill in the morning, two in the afternoon and two po at bedtime Patient taking differently: Take 300-600 mg by mouth See admin instructions. Take 1 capsule every morning then take 2 capsules in the afternoon and at bedtime 08/21/16  Yes Britt Bottom, MD  lisinopril (PRINIVIL,ZESTRIL) 2.5 MG tablet Take 1 tablet (2.5 mg total) by mouth daily. 01/10/17  Yes Rogelia Mire, NP  metoprolol (LOPRESSOR) 50 MG tablet Take 0.5 tablets (25 mg total) by mouth 2 (two) times daily. TAKE 1 TABLET BY MOUTH TWICE DAILY. 01/10/17  Yes Rogelia Mire, NP  nitroGLYCERIN (NITROSTAT) 0.4 MG SL tablet Place 1 tablet (0.4 mg total) under the tongue every 5 (five) minutes as needed for chest pain. 10/20/16 01/18/17 Yes Erma Heritage, PA  pantoprazole (PROTONIX) 40 MG tablet Take 1 tablet (40 mg total) by mouth 2 (two) times daily. 01/02/17  Yes Tiffany Carlota Raspberry, PA-C  phenazopyridine (PYRIDIUM) 200 MG tablet Take 1 tablet (200 mg total) by mouth 3 (three) times daily as needed for pain. 11/29/16  Yes Alveda Reasons, MD  ticagrelor  (BRILINTA) 90 MG TABS tablet Take 1 tablet (90 mg total) by mouth 2 (two) times daily. 01/02/17  Yes Tiffany Carlota Raspberry, PA-C  ticagrelor (BRILINTA) 90 MG TABS tablet Take 1 tablet (90 mg total) by mouth 2 (two) times daily. 01/02/17  Yes Tiffany Carlota Raspberry, PA-C  traMADol (ULTRAM) 50 MG tablet Take 1 tablet (50 mg total) by mouth 4 (four) times daily as needed. 08/21/16  Yes Britt Bottom, MD  triamterene-hydrochlorothiazide (DYAZIDE) 37.5-25 MG capsule Take 1 each (1 capsule total) by mouth daily. 05/31/16  Yes Dorian Heckle English, PA    Review of Systems    As above, she has been having small lightheadedness in the setting of low blood pressures. She is also noticed palpitations most frequently occurring at night and lasting several minutes. She denies chest pain, dyspnea, PND, orthopnea, syncope, edema, or early satiety.  All other systems reviewed and are otherwise negative except as noted above.  Physical Exam    VS:  BP 100/62   Pulse 65   Ht 5\' 2"  (1.575 m)   Wt 138 lb (  62.6 kg)   LMP 12/12/2016 (Exact Date)   BMI 25.24 kg/m  , BMI Body mass index is 25.24 kg/m. GEN: Well nourished, well developed, in no acute distress.  HEENT: normal.  Neck: Supple, no JVD, carotid bruits, or masses. Cardiac: RRR, 2/6 systolic murmur heard throughout, no, rubs, or gallops. No clubbing, cyanosis, edema.  Radials/DP/PT 2+ and equal bilaterally. Right wrist catheterization site without bleeding, bruit, or hematoma. Respiratory:  Respirations regular and unlabored, clear to auscultation bilaterally. GI: Soft, nontender, nondistended, BS + x 4. MS: no deformity or atrophy. Skin: warm and dry, no rash. Neuro:  Strength and sensation are intact. Psych: Normal affect.  Accessory Clinical Findings    ECG - Regular sinus rhythm, 65, inferior T-wave inversion-less pronounced than on prior ECGs.  Assessment & Plan    1.  Non-STEMI, subsequent episode of care/coronary artery disease: Status post recent  admission for non-STEMI with catheterization revealing total occlusion of the proximal RCA. This was successfully treated with a drug-eluting stent. Previously placed mid RCA drug loading stent was notable for 10% in-stent restenosis. She has done reasonably well since discharge though has had lightheadedness in the setting of low blood pressures. She remains on aspirin, high potency statin, Brilinta, beta blocker, and ACE inhibitor. In the setting of low blood pressures, will drop her a sample dose to 2.5 mg daily. She is currently on 25 twice a day of metoprolol. She is interested in cardiac rehabilitation and referral had been made on the inpatient side.  2. Essential hypertension/hypotension: Blood pressures have been running low. I'm going to reduce her lisinopril 2.5 mg daily. She remains on beta blocker and diuretic therapy.   3. Hyperlipidemia: LDL was 83 on March 25. She notes that she was not always taking her Lipitor as prescribed previously. She has been taking as prescribed now has also been watching her fat and cholesterol intake. Plan to follow-up lipids and LFTs in about 6 weeks and if LDL greater than 70 at that time, would have a low threshold to add Zetia.  4. Stage II chronic kidney disease/acute kidney injury: To assure stability.   5. Tobacco abuse: Patient says she quit 6 months ago and has not smoked since. Encouraged her to remain off of cigarettes.  6. Palpitations:  She has been awakening with elevated heart rates in the low 100's.  Cont  blocker. TSH nl during recent hosp stay.  f/u bmet.  Place 30 day event monitor to r/o arrhythmia.  As symptoms appear to be occurring mostly @ night - ? Sleep apnea with sudden awakening and sinus tach.  7.  Disposition: Follow-up bmet today, event monitor.  f/u in clinic in 1 month or sooner if necessary.  Murray Hodgkins, NP 01/10/2017, 5:14 PM

## 2017-01-11 ENCOUNTER — Telehealth: Payer: Self-pay | Admitting: *Deleted

## 2017-01-11 DIAGNOSIS — Z79899 Other long term (current) drug therapy: Secondary | ICD-10-CM

## 2017-01-11 DIAGNOSIS — R7989 Other specified abnormal findings of blood chemistry: Secondary | ICD-10-CM

## 2017-01-11 NOTE — Telephone Encounter (Signed)
Called pt to discuss findings, she'll repeat labwork at [facility] office same date of event monitor placement (~2 weeks out). Aware to call if further needs.

## 2017-01-11 NOTE — Telephone Encounter (Signed)
-----   Message from Rogelia Mire, NP sent at 01/11/2017  8:00 AM EDT ----- Creat mildly elevated above baseline.  OTW stable.  F/u bmet in 2 wks to ensure stability.

## 2017-01-17 ENCOUNTER — Telehealth: Payer: Self-pay | Admitting: Internal Medicine

## 2017-01-17 NOTE — Telephone Encounter (Signed)
Faxed phase II cardiac rehab order to [facility] ICD: I24.9

## 2017-01-18 ENCOUNTER — Other Ambulatory Visit (HOSPITAL_COMMUNITY): Payer: Self-pay | Admitting: Psychiatry

## 2017-01-18 ENCOUNTER — Ambulatory Visit (INDEPENDENT_AMBULATORY_CARE_PROVIDER_SITE_OTHER): Payer: Federal, State, Local not specified - PPO | Admitting: Psychiatry

## 2017-01-18 ENCOUNTER — Encounter (HOSPITAL_COMMUNITY): Payer: Self-pay | Admitting: Psychiatry

## 2017-01-18 VITALS — BP 104/60 | HR 70 | Ht 62.0 in | Wt 144.0 lb

## 2017-01-18 DIAGNOSIS — Z7982 Long term (current) use of aspirin: Secondary | ICD-10-CM | POA: Diagnosis not present

## 2017-01-18 DIAGNOSIS — F4310 Post-traumatic stress disorder, unspecified: Secondary | ICD-10-CM | POA: Diagnosis not present

## 2017-01-18 DIAGNOSIS — F319 Bipolar disorder, unspecified: Secondary | ICD-10-CM

## 2017-01-18 DIAGNOSIS — Z87891 Personal history of nicotine dependence: Secondary | ICD-10-CM

## 2017-01-18 DIAGNOSIS — Z818 Family history of other mental and behavioral disorders: Secondary | ICD-10-CM | POA: Diagnosis not present

## 2017-01-18 DIAGNOSIS — Z79899 Other long term (current) drug therapy: Secondary | ICD-10-CM

## 2017-01-18 DIAGNOSIS — Z813 Family history of other psychoactive substance abuse and dependence: Secondary | ICD-10-CM | POA: Diagnosis not present

## 2017-01-18 MED ORDER — ARIPIPRAZOLE 5 MG PO TABS: 5.0000 mg | ORAL_TABLET | Freq: Every day | ORAL | 0 refills | Status: DC

## 2017-01-18 NOTE — Progress Notes (Signed)
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 98 - 110 mmol/L   CO2 24 20 - 31 mmol/L   Glucose, Bld 82 65 - 99  mg/dL   BUN 13 7 - 25 mg/dL   Creat 1.41 (H) 0.50 - 1.10 mg/dL   Calcium 10.2 8.6 - 10.2 mg/dL   Lab Results  Component Value Date   HGBA1C 5.8 02/22/2016   No results found for: PROLACTIN Lab Results  Component Value Date   CHOL 139 12/31/2016   TRIG 100 12/31/2016   HDL 36 (L) 12/31/2016   CHOLHDL 3.9 12/31/2016   VLDL 20 12/31/2016   LDLCALC 83 12/31/2016   LDLCALC 76 10/25/2015     Current Medications: Current Outpatient Prescriptions  Medication Sig Dispense Refill  . ARIPiprazole (ABILIFY) 5 MG tablet Take 1 tablet (5 mg total) by mouth daily. 30 tablet 0  . aspirin 81 MG chewable tablet Chew 1 tablet (81 mg total) by mouth daily.    Marland Kitchen  atorvastatin (LIPITOR) 80 MG tablet TAKE 1 TABLET BY MOUTH EVERY DAY 30 tablet 0  . baclofen (LIORESAL) 10 MG tablet Take one po in AM, one po in PM and two po qHS (Patient taking differently: Take 5-10 mg by mouth See admin instructions. Take 1 tablet every morning and every evening then take 2 tablets at bedtime) 120 each 11  . fluticasone (FLONASE) 50 MCG/ACT nasal spray SHAKE LIQUID AND USE 2 SPRAYS IN EACH NOSTRIL DAILY 16 g 5  . gabapentin (NEURONTIN) 300 MG capsule Take one pill in the morning, two in the afternoon and two po at bedtime (Patient taking differently: Take 300-600 mg by mouth See admin instructions. Take 1 capsule every morning then take 2 capsules in the afternoon and at bedtime) 150 capsule 11  . lisinopril (PRINIVIL,ZESTRIL) 2.5 MG tablet Take 1 tablet (2.5 mg total) by mouth daily. 90 tablet 3  . metoprolol (LOPRESSOR) 50 MG tablet Take 0.5 tablets (25 mg total) by mouth 2 (two) times daily. TAKE 1 TABLET BY MOUTH TWICE DAILY. 90 tablet 1  . nitroGLYCERIN (NITROSTAT) 0.4 MG SL tablet Place 1 tablet (0.4 mg total) under the tongue every 5 (five) minutes as needed for chest pain. 25 tablet 1  . pantoprazole (PROTONIX) 40 MG tablet Take 1 tablet (40 mg total) by mouth 2 (two) times daily. 30 tablet 6  . phenazopyridine (PYRIDIUM) 200 MG tablet Take 1 tablet (200 mg total) by mouth 3 (three) times daily as needed for pain. 10 tablet 0  . ticagrelor (BRILINTA) 90 MG TABS tablet Take 1 tablet (90 mg total) by mouth 2 (two) times daily. 180 tablet 3  . ticagrelor (BRILINTA) 90 MG TABS tablet Take 1 tablet (90 mg total) by mouth 2 (two) times daily. 60 tablet 0  . traMADol (ULTRAM) 50 MG tablet Take 1 tablet (50 mg total) by mouth 4 (four) times daily as needed. 120 tablet 4  . triamterene-hydrochlorothiazide (DYAZIDE) 37.5-25 MG capsule Take 1 each (1 capsule total) by mouth daily. 90 capsule 3   No current facility-administered medications for this visit.    Facility-Administered  Medications Ordered in Other Visits  Medication Dose Route Frequency Provider Last Rate Last Dose  . gadopentetate dimeglumine (MAGNEVIST) injection 16 mL  16 mL Intravenous Once PRN Britt Bottom, MD        Neurologic: Headache: Yes Seizure: No Paresthesias:Patient has numbness in her hands.  Musculoskeletal: Strength & Muscle Tone: within normal limits Gait & Station: normal Patient leans: N/A  Psychiatric Specialty Exam: ROS  Blood pressure 104/60, pulse 70, height _0  (1.575 m), weight 144 lb (65.3 kg).Body mass index is 26.34 kg/m.  General  Appearance: Emotional and tearful  Eye Contact:  Fair  Speech:  Normal Rate  Volume:  Normal  Mood:  Irritable  Affect:  Labile  Thought Process:  Descriptions of Associations: Circumstantial  Orientation:  Full (Time, Place, and Person)  Thought Content:  Rumination  Suicidal Thoughts:  No  Homicidal Thoughts:  No  Memory:  Immediate;   Fair Recent;   Good Remote;   Good  Judgement:  Good  Insight:  Good  Psychomotor Activity:  Restlessness  Concentration:  Concentration: Fair and Attention Span: Fair  Recall:  Good  Fund of Knowledge:Good  Language: Good  Akathisia:  No  Handed:  Right  AIMS (if indicated):  0  Assets:  Communication Skills Desire for Improvement Housing  ADL's:  Intact  Cognition: WNL  Sleep:  Poor    Assessment: Bipolar disorder type I.  Posttraumatic stress disorder.  Plan: I review her symptoms, history, psychosocial stressors in current medication.  Her creatinine is 1.41 .  Currently she is not taking any psychiatric medication.  I recommended to try Abilify 5 mg daily to help her irritability, anger and mood swings.  I do believe patient require therapy and we will schedule appointment with the therapist in this office for coping and social skills.  Discussed medication side effects especially metabolic syndrome and sedation with Abilify.  Recommended to call us back if she is any question,  concern or if she feels worsening of the symptom.  Discuss safety plan that anytime having active suicidal thoughts or homicidal thoughts and she need to call 911 or go to local emergency room. ARFEEN,SYED T., MD 4/12/201810:15 AM

## 2017-01-19 ENCOUNTER — Ambulatory Visit (INDEPENDENT_AMBULATORY_CARE_PROVIDER_SITE_OTHER): Payer: Federal, State, Local not specified - PPO | Admitting: Urgent Care

## 2017-01-19 VITALS — BP 99/66 | HR 55 | Temp 98.4°F | Resp 17 | Ht 61.5 in | Wt 142.0 lb

## 2017-01-19 DIAGNOSIS — T148XXA Other injury of unspecified body region, initial encounter: Secondary | ICD-10-CM

## 2017-01-19 DIAGNOSIS — Z23 Encounter for immunization: Secondary | ICD-10-CM

## 2017-01-19 MED ORDER — CEPHALEXIN 500 MG PO CAPS: 500.0000 mg | ORAL_CAPSULE | Freq: Three times a day (TID) | ORAL | 0 refills | Status: DC

## 2017-01-19 NOTE — Telephone Encounter (Signed)
Faxed rehab orders with ICD:10 code of I21.4

## 2017-01-19 NOTE — Progress Notes (Signed)
  MRN: [id number] DOB: 1971/04/12  Subjective:   Anna Henson is a 46 y.o. female presenting for chief complaint of Finger Injury  Reports 1 week history of right index finger injury. Patient was trimming hedges and felt a prick to her finger. Since then, she has had a foreign body sensation, swelling, worsening pain. Has used Neosporin over the injury without any improvement. Cannot recall her last tdap. Denies fever, redness, loss of sensation, drainage of pus or bleeding, streaking.   Anna Henson has a current medication list which includes the following prescription(s): aripiprazole, aspirin, atorvastatin, baclofen, fluticasone, gabapentin, lisinopril, metoprolol, nitroglycerin, pantoprazole, phenazopyridine, ticagrelor, ticagrelor, tramadol, and triamterene-hydrochlorothiazide, and the following Facility-Administered Medications: gadopentetate dimeglumine. Also has No Known Allergies.  Anna Henson  has a past medical history of CAD in native artery; Chronic kidney disease; CKD (chronic kidney disease), stage II; HOCM (hypertrophic obstructive cardiomyopathy) ([place]); Hyperlipidemia; Hypertension; Palpitations; and Tobacco abuse (09/13/2014). Also  has a past surgical history that includes Hernia repair; Coronary angioplasty with stent; left heart catheterization with coronary angiogram (N/A, 09/14/2014); percutaneous coronary stent intervention (pci-s) (09/14/2014); Tonsillectomy (2005); Left Heart Cath and Coronary Angiography (N/A, 12/31/2016); and Coronary/Graft Acute MI Revascularization (N/A, 12/31/2016).  Objective:   Vitals: BP 99/66   Pulse (!) 55   Temp 98.4 F (36.9 C) (Oral)   Resp 17   Ht 5' 1.5" (1.562 m)   Wt 142 lb (64.4 kg)   LMP 01/12/2017 (Approximate)   SpO2 97%   BMI 26.40 kg/m   Physical Exam  Constitutional: She is oriented to person, place, and time. She appears well-developed and well-nourished.  Cardiovascular: Normal rate.   Pulmonary/Chest: Effort normal.    Musculoskeletal:       Right hand: She exhibits tenderness and swelling (trace). She exhibits normal range of motion, no bony tenderness, normal two-point discrimination, normal capillary refill, no deformity and no laceration. Normal sensation noted. Normal strength noted.       Hands: Neurological: She is alert and oriented to person, place, and time.    PROCEDURE: Foreign Body Removal Verbal consent obtained. Used alcohol prep pad followed by 0.5cc of lidocaine for local anaesthesia. An 18g needle was used at cuticle. A ~0.5cm sliver was removed. Hemostasis achieved with pressure. Cleansed and dressed.  Assessment and Plan :   1. Splinter in skin - Foreign body removed successfully. Patient tolerated procedure well. Will start Keflex if signs of infection develop.  - cephALEXin (KEFLEX) 500 MG capsule; Take 1 capsule (500 mg total) by mouth 3 (three) times daily.  Dispense: 21 capsule; Refill: 0  2. Need for Tdap vaccination - Tdap vaccine greater than or equal to 7yo IM   Anna Eagles, PA-C [facility] [phone] 01/19/2017  5:29 PM

## 2017-01-19 NOTE — Patient Instructions (Addendum)
Sliver Removal, Care After A sliver-also called a splinter-is a small and thin broken piece of an object that gets stuck (embedded) under the skin. A sliver can create a deep wound that can easily become infected. It is important to care for the wound after a sliver is removed to help prevent infection and other problems from developing. What can I expect after the procedure? Slivers often break into smaller pieces when they are removed. If pieces of your sliver broke off and stayed in your skin, you will eventually see them working themselves out and you may feel some pain at the wound site. This is normal. Follow these instructions at home:  Keep all follow-up visits as directed by your health care provider. This is important.  There are many different ways to close and cover a wound, including stitches (sutures) and adhesive strips. Follow your health care provider's instructions about:  Wound care.  Bandage (dressing) changes and removal.  Wound closure removal.  Check the wound site every day for signs of infection. Watch for:  Red streaks coming from the wound.  Fever.  Redness or tenderness around the wound.  Fluid, blood, or pus coming from the wound.  A bad smell coming from the wound. Contact a health care provider if:  You think that a piece of the sliver is still in your skin.  Your wound was closed, as with sutures, and the edges of the wound break open.  You have signs of infection, including:  New or worsening redness around the wound.  New or worsening tenderness around the wound.  Fluid, blood, or pus coming from the wound.  A bad smell coming from the wound or dressing. Get help right away if: You have any of the following signs of infection:  Red streaks coming from the wound.  An unexplained fever. This information is not intended to replace advice given to you by your health care provider. Make sure you discuss any questions you have with your  health care provider. Document Released: 09/22/2000 Document Revised: 05/21/2016 Document Reviewed: 05/28/2014 Elsevier Interactive Patient Education  2017 [employer].     IF you received an x-ray today, you will receive an invoice from [facility]. Please contact [facility] at [phone] with questions or concerns regarding your invoice.   IF you received labwork today, you will receive an invoice from [facility]. Please contact [facility] at [phone] with questions or concerns regarding your invoice.   Our billing staff will not be able to assist you with questions regarding bills from these companies.  You will be contacted with the lab results as soon as they are available. The fastest way to get your results is to activate your My Chart account. Instructions are located on the last page of this paperwork. If you have not heard from [place] regarding the results in 2 weeks, please contact this office.

## 2017-01-22 ENCOUNTER — Ambulatory Visit (INDEPENDENT_AMBULATORY_CARE_PROVIDER_SITE_OTHER): Payer: Federal, State, Local not specified - PPO

## 2017-01-22 ENCOUNTER — Other Ambulatory Visit: Payer: Federal, State, Local not specified - PPO | Admitting: *Deleted

## 2017-01-22 DIAGNOSIS — R002 Palpitations: Secondary | ICD-10-CM

## 2017-01-22 DIAGNOSIS — I10 Essential (primary) hypertension: Secondary | ICD-10-CM

## 2017-01-22 NOTE — Addendum Note (Signed)
Addended by: Eulis Foster on: 01/22/2017 04:12 PM   Modules accepted: Orders

## 2017-01-23 LAB — BASIC METABOLIC PANEL
BUN/Creatinine Ratio: 9 (ref 9–23)
BUN: 12 mg/dL (ref 6–24)
CO2: 23 mmol/L (ref 18–29)
Calcium: 10 mg/dL (ref 8.7–10.2)
Chloride: 99 mmol/L (ref 96–106)
Creatinine, Ser: 1.3 mg/dL — ABNORMAL HIGH (ref 0.57–1.00)
GFR calc Af Amer: 58 mL/min/{1.73_m2} — ABNORMAL LOW (ref >59)
GFR calc non Af Amer: 50 mL/min/{1.73_m2} — ABNORMAL LOW (ref >59)
Glucose: 85 mg/dL (ref 65–99)
Potassium: 3.8 mmol/L (ref 3.5–5.2)
Sodium: 143 mmol/L (ref 134–144)

## 2017-02-08 ENCOUNTER — Encounter (HOSPITAL_COMMUNITY): Payer: Self-pay | Admitting: Emergency Medicine

## 2017-02-08 ENCOUNTER — Emergency Department (HOSPITAL_COMMUNITY)
Admission: EM | Admit: 2017-02-08 | Discharge: 2017-02-08 | Disposition: A | Discharge status: Home / Self Care | Payer: Federal, State, Local not specified - PPO | Attending: Emergency Medicine | Admitting: Emergency Medicine

## 2017-02-08 DIAGNOSIS — Z79899 Other long term (current) drug therapy: Secondary | ICD-10-CM | POA: Insufficient documentation

## 2017-02-08 DIAGNOSIS — Z955 Presence of coronary angioplasty implant and graft: Secondary | ICD-10-CM | POA: Diagnosis not present

## 2017-02-08 DIAGNOSIS — Z87891 Personal history of nicotine dependence: Secondary | ICD-10-CM | POA: Diagnosis not present

## 2017-02-08 DIAGNOSIS — M5442 Lumbago with sciatica, left side: Secondary | ICD-10-CM | POA: Insufficient documentation

## 2017-02-08 DIAGNOSIS — I252 Old myocardial infarction: Secondary | ICD-10-CM | POA: Diagnosis not present

## 2017-02-08 DIAGNOSIS — I129 Hypertensive chronic kidney disease with stage 1 through stage 4 chronic kidney disease, or unspecified chronic kidney disease: Secondary | ICD-10-CM | POA: Insufficient documentation

## 2017-02-08 DIAGNOSIS — I2510 Atherosclerotic heart disease of native coronary artery without angina pectoris: Secondary | ICD-10-CM | POA: Diagnosis not present

## 2017-02-08 DIAGNOSIS — Z7982 Long term (current) use of aspirin: Secondary | ICD-10-CM | POA: Insufficient documentation

## 2017-02-08 DIAGNOSIS — M545 Low back pain, unspecified: Secondary | ICD-10-CM

## 2017-02-08 DIAGNOSIS — N182 Chronic kidney disease, stage 2 (mild): Secondary | ICD-10-CM | POA: Diagnosis not present

## 2017-02-08 NOTE — ED Provider Notes (Signed)
[name] DEPT Provider Note   CSN: [id number] Arrival date & time: 02/08/17  0921  By signing my name below, I, Anna Henson, attest that this documentation has been prepared under the direction and in the presence of [employer], PA-C.  Electronically Signed: Reola Henson, ED Scribe. 02/08/17. 10:47 AM.  History   Chief Complaint Chief Complaint  Patient presents with  . Back Pain   The history is provided by the patient and medical records. No language interpreter was used.    HPI Comments: Anna Henson is a 46 y.o. female with a h/o NSTEMI on 12/31/16, who presents to the Emergency Department complaining of persistent, worsening lower back pain beginning two days ago. Her pain radiates down her left leg and distally into her left toes. Per pt, she began cardiac rehab three days ago and two days ago following using a stationary bike and performing other exercises her pain began. No direct trauma or injury to the area. No h/o back pain prior to the onset of her current pain. She notes that her pain is exacerbated with raising her left leg and with certain movements. She has been massaging the area, taking Tramadol, and Tylenol without relief of her pain. Pt denies numbness, weakness, bowel/bladder incontinence, urinary retention, fever, chest pain, or any other associated symptoms.   Past Medical History:  Diagnosis Date  . CAD in native artery    a. 09/2014 Cath/PCI in setting of [place] - s/p 2.25 x 12 mm Promus Premier DES to the RCA;  b. 11/2016 Myoview: EF 56%, no ischemia;  c. 12/2016 NSTEMI/PCI: LM nl, LAd 25p, LCX 30p, RCA 100p (2.75x38 Promus Premier DES), mRCA 10 ISR, EF 50-55%.  . Chronic kidney disease    she sthinks has stage 2 CKD, has had US doppler and has some stenosis , this was done in [place]   . CKD (chronic kidney disease), stage II   . HOCM (hypertrophic obstructive cardiomyopathy) ([place])    a. 12/2016 Echo: EF 65-70%,  mod conc LVH, dynamic  obstruction @ rest, peak velocity of 291 cm/sec w/ peak gradient of 55mmHg, no rwma, Gr1 DD, triv TR, PASP 44mmHg.  Marland Kitchen Hyperlipidemia   . Hypertension    > 5 years  . Palpitations   . Tobacco abuse 09/13/2014    Patient Active Problem List   Diagnosis Date Noted  . S/P cardiac cath (12/2016) a. acute total occlusion of the RCA tx w/PCI DES, b. mild nonobs LAD/LCx stenosis, c. mild segmental LV sys dx w/ sev hypokinesis, LVEF 50-55% 01/02/2017  . NSTEMI (non-ST elevated myocardial infarction) ([place]) 12/31/2016  . Malignant hypertension   . Chronic kidney disease   . ST elevation myocardial infarction involving right coronary artery ([place])   . Abnormal EKG   . Nausea   . Ischemic cardiomyopathy   . Muscle spasms of both lower extremities 04/17/2016  . Gait disturbance 01/25/2016  . 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 Acute-on-chronic kidney injury ([place]) 11/12/2015  . Neck pain 11/12/2015  . Hypokalemia 11/12/2015  . Abnormal MRI, neck 11/12/2015  . Numbness 11/12/2015  . Hypertension   . Hyperlipidemia   . CAD in native artery   . Facial numbness   . Right arm numbness   . Throat pain 11/09/2015  . Drooling 11/09/2015  . CAD (coronary artery disease), native coronary artery 09/15/2014  . Headache 09/15/2014  . UTI (urinary tract infection): Probable 09/14/2014  . Candida infection of genital region 09/14/2014  . Unstable  angina ([place]) 09/13/2014  . HLD (hyperlipidemia) 09/13/2014  . Otitis 09/13/2014  . ACS (acute coronary syndrome) ([place]) 09/13/2014  . Tobacco abuse 09/13/2014  . Essential hypertension 09/09/2014    Past Surgical History:  Procedure Laterality Date  . CORONARY ANGIOPLASTY WITH STENT PLACEMENT     L main OK, LAD OK, CFX 30%, RCA 90%-0% w/ 2.25 x 12 mm Promus Premier DES, EF 55%  . CORONARY/GRAFT ACUTE MI REVASCULARIZATION N/A 12/31/2016   Procedure: Coronary/Graft Acute MI Revascularization;  Surgeon: Sherren Mocha, MD;  Location: [facility];   Service: Cardiovascular;  Laterality: N/A;  . HERNIA REPAIR    . LEFT HEART CATH AND CORONARY ANGIOGRAPHY N/A 12/31/2016   Procedure: Left Heart Cath and Coronary Angiography;  Surgeon: Sherren Mocha, MD;  Location: [facility];  Service: Cardiovascular;  Laterality: N/A;  . LEFT HEART CATHETERIZATION WITH CORONARY ANGIOGRAM N/A 09/14/2014   Procedure: LEFT HEART CATHETERIZATION WITH CORONARY ANGIOGRAM;  Surgeon: Burnell Blanks, MD;  Location: [facility] CATH LAB;  Service: Cardiovascular;  Laterality: N/A;  . PERCUTANEOUS CORONARY STENT INTERVENTION (PCI-S)  09/14/2014   Procedure: PERCUTANEOUS CORONARY STENT INTERVENTION (PCI-S);  Surgeon: Burnell Blanks, MD;  Location: [facility] CATH LAB;  Service: Cardiovascular;;  . TONSILLECTOMY  2005   10 years ago    OB History    No data available       Home Medications    Prior to Admission medications   Medication Sig Start Date End Date Taking? Authorizing Provider  ARIPiprazole (ABILIFY) 5 MG tablet TAKE 1 TABLET BY MOUTH DAILY 01/18/17   Kathlee Nations, MD  aspirin 81 MG chewable tablet Chew 1 tablet (81 mg total) by mouth daily. 09/16/14   Nita Sells, MD  atorvastatin (LIPITOR) 80 MG tablet TAKE 1 TABLET BY MOUTH EVERY DAY 08/25/16   Wendie Agreste, MD  baclofen (LIORESAL) 10 MG tablet Take one po in AM, one po in PM and two po qHS Patient taking differently: Take 5-10 mg by mouth See admin instructions. Take 1 tablet every morning and every evening then take 2 tablets at bedtime 04/17/16   Britt Bottom, MD  cephALEXin (KEFLEX) 500 MG capsule Take 1 capsule (500 mg total) by mouth 3 (three) times daily. 01/19/17   Jaynee Eagles, PA-C  fluticasone (FLONASE) 50 MCG/ACT nasal spray SHAKE LIQUID AND USE 2 SPRAYS IN EACH NOSTRIL DAILY 12/04/16   Chelle Jeffery, PA-C  gabapentin (NEURONTIN) 300 MG capsule Take one pill in the morning, two in the afternoon and two po at bedtime Patient taking differently: Take 300-600 mg by mouth See  admin instructions. Take 1 capsule every morning then take 2 capsules in the afternoon and at bedtime 08/21/16   Britt Bottom, MD  lisinopril (PRINIVIL,ZESTRIL) 2.5 MG tablet Take 1 tablet (2.5 mg total) by mouth daily. 01/10/17   Rogelia Mire, NP  metoprolol (LOPRESSOR) 50 MG tablet Take 0.5 tablets (25 mg total) by mouth 2 (two) times daily. TAKE 1 TABLET BY MOUTH TWICE DAILY. 01/10/17   Rogelia Mire, NP  nitroGLYCERIN (NITROSTAT) 0.4 MG SL tablet Place 1 tablet (0.4 mg total) under the tongue every 5 (five) minutes as needed for chest pain. 10/20/16 01/19/17  Fransisco Hertz Strader, PA-C  pantoprazole (PROTONIX) 40 MG tablet Take 1 tablet (40 mg total) by mouth 2 (two) times daily. 01/02/17   Tiffany Carlota Raspberry, PA-C  phenazopyridine (PYRIDIUM) 200 MG tablet Take 1 tablet (200 mg total) by mouth 3 (three) times daily  as needed for pain. 11/29/16   Alveda Reasons, MD  ticagrelor (BRILINTA) 90 MG TABS tablet Take 1 tablet (90 mg total) by mouth 2 (two) times daily. 01/02/17   Delos Haring, PA-C  ticagrelor (BRILINTA) 90 MG TABS tablet Take 1 tablet (90 mg total) by mouth 2 (two) times daily. 01/02/17   Tiffany Carlota Raspberry, PA-C  traMADol (ULTRAM) 50 MG tablet Take 1 tablet (50 mg total) by mouth 4 (four) times daily as needed. 08/21/16   Britt Bottom, MD  triamterene-hydrochlorothiazide (DYAZIDE) 37.5-25 MG capsule Take 1 each (1 capsule total) by mouth daily. 05/31/16   Joretta Bachelor, PA   Family History Family History  Problem Relation Age of Onset  . Diabetes Mother   . Heart disease Mother   . Hyperlipidemia Mother   . Hypertension Mother   . Lung cancer Father   . Drug abuse Father   . Drug abuse Sister   . Mental illness Brother   . Mental illness Maternal Grandmother    Social History Social History  Substance Use Topics  . Smoking status: Former Smoker    Packs/day: 0.50    Types: Cigarettes  . Smokeless tobacco: Never Used     Comment: on patch since 09/09/14  . Alcohol  use No   Allergies   Patient has no known allergies.  Review of Systems Review of Systems  Cardiovascular: Negative for chest pain.  Musculoskeletal: Positive for back pain and myalgias.  Neurological: Negative for weakness and numbness.  All other systems reviewed and are negative.  Physical Exam Updated Vital Signs BP 121/74 (BP Location: Right Arm)   Pulse 71   Temp 98.1 F (36.7 C) (Oral)   Resp 18   LMP 01/12/2017 (Approximate)   SpO2 100%   Physical Exam  Constitutional: She is oriented to person, place, and time. She appears well-developed and well-nourished. No distress.  HENT:  Head: Normocephalic.  Neck: Normal range of motion. Neck supple.  Pulmonary/Chest: Effort normal.  Musculoskeletal: Normal range of motion. She exhibits no edema.  No C, T, or L spine tenderness to palpation. No signs of infection. No obvious signs of trauma, deformity, infection, step-offs. Lung expansion normal. No scoliosis or kyphosis. Bilateral lower extremity strength 5 out of 5, sensation grossly intact, patellar reflexes 2+, pedal pulses 2+, Refill less than 3 seconds.  Straight leg negative, but pain with left leg raise.  Neurological: She is alert and oriented to person, place, and time.  Skin: Skin is warm and dry. She is not diaphoretic.  Psychiatric: She has a normal mood and affect. Her behavior is normal. Judgment and thought content normal.  Nursing note and vitals reviewed.  ED Treatments / Results  DIAGNOSTIC STUDIES: Oxygen Saturation is 100% on RA, normal by my interpretation.   COORDINATION OF CARE: 10:43 AM-Discussed next steps with pt. Pt verbalized understanding and is agreeable with the plan.   Labs (all labs ordered are listed, but only abnormal results are displayed) Labs Reviewed - No data to display  EKG  EKG Interpretation None      Radiology No results found.  Procedures Procedures   Medications Ordered in ED Medications - No data to  display  Initial Impression / Assessment and Plan / ED Course  I have reviewed the triage vital signs and the nursing notes.  Pertinent labs & imaging results that were available during my care of the patient were reviewed by me and considered in my medical decision making (see chart  for details).    Assessment/Plan: Patient's presentation is most consistent with musculoskeletal back pain.  She has no red flags.  This is likely related to new exercise.  She will be referred to orthopedics if symptoms persist, likely this will resolve without significant intervention.  She is given strict return precautions, symptomatic care instructions.  She verbalized understanding and agreement to today's plan had no further questions or concerns the time discharge  Final Clinical Impressions(s) / ED Diagnoses   Final diagnoses:  Acute left-sided low back pain without sciatica   New Prescriptions Discharge Medication List as of 02/08/2017 10:53 AM     I personally performed the services described in this documentation, which was scribed in my presence. The recorded information has been reviewed and is accurate.    Okey Regal, PA-C 02/08/17 1230    Virgel Manifold, MD 02/12/17 [phone]

## 2017-02-08 NOTE — Discharge Instructions (Signed)
Please read attached information. If you experience any new or worsening signs or symptoms please return to the emergency room for evaluation. Please follow-up with your primary care provider or specialist as discussed. Please use medication prescribed only as directed and discontinue taking if you have any concerning signs or symptoms.   °

## 2017-02-08 NOTE — ED Triage Notes (Signed)
Pt reports she went to cardiac rehab yesterday, rode the bike and did a lot of exercises, began having lumbar back pain last night that radiated down her left leg. Pt ambulatory to triage.

## 2017-02-15 ENCOUNTER — Ambulatory Visit (INDEPENDENT_AMBULATORY_CARE_PROVIDER_SITE_OTHER): Payer: Federal, State, Local not specified - PPO | Admitting: Psychiatry

## 2017-02-15 ENCOUNTER — Other Ambulatory Visit (HOSPITAL_COMMUNITY): Payer: Self-pay | Admitting: Psychiatry

## 2017-02-15 ENCOUNTER — Encounter (HOSPITAL_COMMUNITY): Payer: Self-pay | Admitting: Psychiatry

## 2017-02-15 DIAGNOSIS — Z818 Family history of other mental and behavioral disorders: Secondary | ICD-10-CM

## 2017-02-15 DIAGNOSIS — Z79899 Other long term (current) drug therapy: Secondary | ICD-10-CM | POA: Diagnosis not present

## 2017-02-15 DIAGNOSIS — F4310 Post-traumatic stress disorder, unspecified: Secondary | ICD-10-CM

## 2017-02-15 DIAGNOSIS — F319 Bipolar disorder, unspecified: Secondary | ICD-10-CM

## 2017-02-15 DIAGNOSIS — Z7982 Long term (current) use of aspirin: Secondary | ICD-10-CM | POA: Diagnosis not present

## 2017-02-15 DIAGNOSIS — Z813 Family history of other psychoactive substance abuse and dependence: Secondary | ICD-10-CM

## 2017-02-15 DIAGNOSIS — Z87891 Personal history of nicotine dependence: Secondary | ICD-10-CM

## 2017-02-15 MED ORDER — ARIPIPRAZOLE 5 MG PO TABS: 5.0000 mg | ORAL_TABLET | Freq: Every day | ORAL | 0 refills | Status: DC

## 2017-02-15 NOTE — Progress Notes (Signed)
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 100/58, pulse 86, height 5' 1.25" (1.556 m), weight 140 lb 12.8 oz (63.9 kg).Body mass index is 26.39 kg/m.  General Appearance: Casual  Eye Contact:  Good  Speech:  Clear and Coherent  Volume:  Normal  Mood:  Anxious  Affect:  Congruent  Thought Process:  Goal Directed  Orientation:  Full (Time, Place, and Person)  Thought Content: WDL and Logical   Suicidal Thoughts:  No  Homicidal Thoughts:  No  Memory:  Immediate;   Good Recent;   Fair Remote;   Fair  Judgement:  Good  Insight:  Good  Psychomotor Activity:  Normal  Concentration:  Concentration: Fair and Attention Span: Fair  Recall:  Good  Fund of Knowledge: Good  Language: Good  Akathisia:  No  Handed:  Right  AIMS (if indicated):  0   Assets:  Communication Skills Desire for Improvement Housing Resilience Social Support  ADL's:  Intact  Cognition: WNL  Sleep:  Improved    Assessment: Bipolar disorder type I.  Posttraumatic stress disorder.  Plan: Patient is doing better since Abilify started.  She is taking half tablet but I recommended to take full dose 5 mg to help resident mood lability.  I also encouraged to see therapist for coping and social skills.  Patient has not started therapy yet.  I review her collateral information, patient recently seen in the emergency room because of back pain and she had blood work last month at her creatinine is improved from the past.  Patient has no tremors, shakes or any EPS.  Continue Abilify 5 mg daily.  Discuss safety plan that anytime having active suicidal thoughts or homicidal thoughts then she need to call 911 or go to the local emergency room.  Time spent 25 minutes.  Follow-up in 3 months.  Discuss medication side effects and benefits.  Braylie Badami T., MD 02/15/2017, 3:36 PM

## 2017-02-23 ENCOUNTER — Encounter: Payer: Self-pay | Admitting: Internal Medicine

## 2017-02-23 ENCOUNTER — Ambulatory Visit (INDEPENDENT_AMBULATORY_CARE_PROVIDER_SITE_OTHER): Payer: Federal, State, Local not specified - PPO | Admitting: Internal Medicine

## 2017-02-23 VITALS — BP 111/72 | HR 77 | Ht 61.5 in | Wt 142.2 lb

## 2017-02-23 DIAGNOSIS — I214 Non-ST elevation (NSTEMI) myocardial infarction: Secondary | ICD-10-CM | POA: Diagnosis not present

## 2017-02-23 DIAGNOSIS — R002 Palpitations: Secondary | ICD-10-CM

## 2017-02-23 DIAGNOSIS — E785 Hyperlipidemia, unspecified: Secondary | ICD-10-CM | POA: Diagnosis not present

## 2017-02-23 NOTE — Progress Notes (Signed)
02/23/2017   PCP: Wardell Honour, MD   Chief Complaint  Patient presents with  . Follow-up    monitor;  . Shortness of Breath    occasionally.  . Dizziness    randomly.    Primary Cardiologist:Dr. Alma Friendly   CC: Occasional shortness of breath  HPI:  46 year old Female hx of hypertension, dyslipidemia, chronic tobacco use, chronic kidney disease stage III admitted with hypertensive urgency 09/13/14 and 6/10 chest pain EKG showed lateral T-wave inversions on admission and cardiology was consulted. It was felt she had unstable angina and she was placed on nitro drip.  Cardiac catheterization with 2.25X 12 mm Promus remainder DES to the M RCA on 09/14/14 for 90% stenosis. EF noted 65- 70%.  She did well and discharged without complication.    She was on numerous BP meds and her BP was running in the 90s to 106 and she felt very weak, lethargic.  The hydralazine was stopped and the cardizem.  She is feeling better.  She has occasional brief chest pressure feeling like the discomfort she was admitted with.  She is here today for follow-up. She is actually doing quite well her questions we discussed where the pneumonia vaccine she is only 89 and essentially in good health told her that was not needed unless she wanted it she does not want a flu vaccine she's had side effects in the past and refuses to take it. She is scheduled for sleep study January 29 2 [facility]. She was recently seen by her primary care for cold symptoms and was placed on Zithromax.  She is feeling better.    She is asking about exercise which I recommended she could go back to to start slowly but work only a peripheral 1-2 weeks. She did not have a heart attack and her EF is normal.  Her mother was with her today who also has stents but the mother is or bare-metal stents. We discussed the difference between the 2 and the need for Plavix with both, and the length of time she would need to take the  Plavix.  She is no longer smoking. She is using the NicoDerm.  I saw Anna Henson back today in the office for follow-up. She denies any chest pain or worsening shortness of breath. She seems to be stable from a cardiac standpoint after her stent. She does however have an acute complaint. She tells me that about 2 weeks ago she had a viral upper respiratory infection and had a lot of discharge and pus. She has sore throat associated with this. Subsequently she felt a pop on the right side of her head. After that she had pain and numbness on the right side of her face on her right shoulder and down to the right upper arm and right upper back. She's also had difficulty swallowing due to pain particularly in the right side of her throat as well as some mild drooling. She denies any facial droop, right sided weakness, loss of consciousness, change in vision or other associated symptoms. She denies any headache. Blood pressure has been well controlled and is at goal today at 122/76. She was seen in urgent care and treated for acute pansinusitis by Dr. Elder Cyphers and given amoxicillin which she completed. Lab showed no elevated white count, normal renal function, her cholesterol profile was well controlled, and she had a low ESR 15  (which would likely exclude temporal arteritis).  02/23/2017  Anna Henson returns  today for follow-up. Unfortunately she suffered a non-ST elevation MI in March 2018. She was found to have total occlusion of the RCA treated with drug-eluting stents. There was some mild nonobstructive disease of the LAD and circumflex and some mild LV systolic dysfunction with EF 50-55%. She reports doing much better since discharge. She denies any recurrent chest pain but does get short of breath and some fatigue with exertion. She started cardiac rehabilitation but found that it worsened her chronic back pain and now she's discontinued that. She recently saw Ignacia Bayley, NP, in follow-up. She was felt to be  doing well although reported having some tachycardia palpitations when awakening from sleep in the middle the night. He was concerned about arrhythmias and placed a monitor. Her monitor showed normal sinus rhythm without any arrhythmias. She since discontinued that. She is on Brilinta but does not report that her shortness of breath seems to be related to the doses. I advised that she can consider some caffeine after the medication to see if it attenuates some of the symptoms.  No Known Allergies  Current Outpatient Prescriptions  Medication Sig Dispense Refill  . ARIPiprazole (ABILIFY) 5 MG tablet Take 1 tablet (5 mg total) by mouth daily. 90 tablet 0  . aspirin 81 MG chewable tablet Chew 1 tablet (81 mg total) by mouth daily.    Marland Kitchen atorvastatin (LIPITOR) 80 MG tablet TAKE 1 TABLET BY MOUTH EVERY DAY 30 tablet 0  . baclofen (LIORESAL) 10 MG tablet Take one po in AM, one po in PM and two po qHS (Patient taking differently: Take 5-10 mg by mouth See admin instructions. Take 1 tablet every morning and every evening then take 2 tablets at bedtime) 120 each 11  . fluticasone (FLONASE) 50 MCG/ACT nasal spray SHAKE LIQUID AND USE 2 SPRAYS IN EACH NOSTRIL DAILY 16 g 5  . gabapentin (NEURONTIN) 300 MG capsule Take one pill in the morning, two in the afternoon and two po at bedtime (Patient taking differently: Take 300-600 mg by mouth See admin instructions. Take 1 capsule every morning then take 2 capsules in the afternoon and at bedtime) 150 capsule 11  . lisinopril (PRINIVIL,ZESTRIL) 2.5 MG tablet Take 2.5 mg by mouth.    . metoprolol (LOPRESSOR) 50 MG tablet Take 0.5 tablets (25 mg total) by mouth 2 (two) times daily. TAKE 1 TABLET BY MOUTH TWICE DAILY. 90 tablet 1  . pantoprazole (PROTONIX) 40 MG tablet Take 1 tablet (40 mg total) by mouth 2 (two) times daily. 30 tablet 6  . phenazopyridine (PYRIDIUM) 200 MG tablet Take 1 tablet (200 mg total) by mouth 3 (three) times daily as needed for pain. 10 tablet 0   . ticagrelor (BRILINTA) 90 MG TABS tablet Take 1 tablet (90 mg total) by mouth 2 (two) times daily. 180 tablet 3  . traMADol (ULTRAM) 50 MG tablet Take 1 tablet (50 mg total) by mouth 4 (four) times daily as needed. 120 tablet 4  . triamterene-hydrochlorothiazide (DYAZIDE) 37.5-25 MG capsule Take 1 each (1 capsule total) by mouth daily. 90 capsule 3  . nitroGLYCERIN (NITROSTAT) 0.4 MG SL tablet Place 1 tablet (0.4 mg total) under the tongue every 5 (five) minutes as needed for chest pain. 25 tablet 1   No current facility-administered medications for this visit.    Facility-Administered Medications Ordered in Other Visits  Medication Dose Route Frequency Provider Last Rate Last Dose  . gadopentetate dimeglumine (MAGNEVIST) injection 16 mL  16 mL Intravenous Once PRN Arlice Colt  A, MD        Past Medical History:  Diagnosis Date  . CAD in native artery    a. 09/2014 Cath/PCI in setting of [place] - s/p 2.25 x 12 mm Promus Premier DES to the RCA;  b. 11/2016 Myoview: EF 56%, no ischemia;  c. 12/2016 NSTEMI/PCI: LM nl, LAd 25p, LCX 30p, RCA 100p (2.75x38 Promus Premier DES), mRCA 10 ISR, EF 50-55%.  . Chronic kidney disease    she sthinks has stage 2 CKD, has had US doppler and has some stenosis , this was done in [place]   . CKD (chronic kidney disease), stage II   . HOCM (hypertrophic obstructive cardiomyopathy) ([place])    a. 12/2016 Echo: EF 65-70%,  mod conc LVH, dynamic obstruction @ rest, peak velocity of 291 cm/sec w/ peak gradient of 80mHg, no rwma, Gr1 DD, triv TR, PASP 161mg.  . Marland Kitchenyperlipidemia   . Hypertension    > 5 years  . Palpitations   . Tobacco abuse 09/13/2014    Past Surgical History:  Procedure Laterality Date  . CORONARY ANGIOPLASTY WITH STENT PLACEMENT     L main OK, LAD OK, CFX 30%, RCA 90%-0% w/ 2.25 x 12 mm Promus Premier DES, EF 55%  . CORONARY/GRAFT ACUTE MI REVASCULARIZATION N/A 12/31/2016   Procedure: Coronary/Graft Acute MI Revascularization;  Surgeon:  MiSherren MochaMD;  [facility];  Service: Cardiovascular;  Laterality: N/A;  . HERNIA REPAIR    . LEFT HEART CATH AND CORONARY ANGIOGRAPHY N/A 12/31/2016   Procedure: Left Heart Cath and Coronary Angiography;  Surgeon: MiSherren MochaMD;  [facility];  Service: Cardiovascular;  Laterality: N/A;  . LEFT HEART CATHETERIZATION WITH CORONARY ANGIOGRAM N/A 09/14/2014   Procedure: LEFT HEART CATHETERIZATION WITH CORONARY ANGIOGRAM;  Surgeon: ChBurnell BlanksMD;  [facility];  Service: Cardiovascular;  Laterality: N/A;  . PERCUTANEOUS CORONARY STENT INTERVENTION (PCI-S)  09/14/2014   Procedure: PERCUTANEOUS CORONARY STENT INTERVENTION (PCI-S);  Surgeon: ChBurnell BlanksMD;  [facility];  Service: Cardiovascular;;  . TONSILLECTOMY  2005   10 years ago    ROS: Pertinent items noted in HPI and remainder of comprehensive ROS otherwise negative.  Wt Readings from Last 3 Encounters:  02/23/17 142 lb 3.2 oz (64.5 kg)  01/19/17 142 lb (64.4 kg)  01/10/17 138 lb (62.6 kg)    PHYSICAL EXAM BP 111/72   Pulse 77   Ht 5' 1.5" (1.562 m)   Wt 142 lb 3.2 oz (64.5 kg)   BMI 26.43 kg/m  General appearance: alert and no distress Neck: no carotid bruit and no JVD Lungs: clear to auscultation bilaterally Heart: regular rate and rhythm, S1, S2 normal, no murmur, click, rub or gallop Abdomen: soft, non-tender; bowel sounds normal; no masses,  no organomegaly Extremities: extremities normal, atraumatic, no cyanosis or edema Pulses: 2+ and symmetric Skin: Skin color, texture, turgor normal. No rashes or lesions Neurologic: Grossly normal Psych: pleasant  EKG:  Deferred  ASSESSMENT: 1. Recent NSTEMI - s/p repeat PCI to the RCA 12/2016 - DES 2. Coronary artery disease status post PCI to the RCA in 2015 3. Dyslipidemia-at goal 4. Hypertension-controlled 5. DOE  PLAN: Ms ShPanchalnfortunately had recurrent coronary event to the right  coronary artery. She had prior stent in the RCA in 2015 and had a repeat event. She was treated with drug-eluting stent in his had improvement in her chest pain. She reports some mild dyspnea and exertion  which she says is improving. She's not attending cardiac rehabilitation due to problems with her back. Cholesterol is reasonably well-controlled although LDL is not yet under goal. We'll repeat that in 6 months and intentionally up titrate medication or add ezetimibe to reach target. Blood pressure is at goal today. She recently had tachypalpitations wore monitor which showed no evidence of any arrhythmias. She says those are now gone away and she feels well.  Follow up with me in 6 months.  Pixie Casino, MD, [facility] Attending Cardiologist [place]

## 2017-02-23 NOTE — Patient Instructions (Addendum)
Your physician wants you to follow-up in: 6 months with Dr. Debara Pickett. You will receive a reminder letter in the mail two months in advance. If you don't receive a letter, please call our office to schedule the follow-up appointment.  Your physician recommends that you return for lab work prior to your next visit

## 2017-03-05 ENCOUNTER — Telehealth: Payer: Self-pay | Admitting: Interventional Cardiology

## 2017-03-05 NOTE — Telephone Encounter (Signed)
   Patient called due to high BP readings of 170/90.  She has taken her meds.    Her lisinopril had been decreased from 10 mg to 2.5 mg a few weeks ago, due to "low" readings, although she says her readings were in the 110-115/60 range.    I instructed her to take 5 mg lisinopril ( she still has 10 mg tabs) and recheck BP in a few hours.  If BP is still over 824 systolic, she can take an additional 2.5 mg lisinopril.    Will route to Dr. Debara Pickett and PharmD [facility] clinic.  Jettie Booze, MD

## 2017-03-06 MED ORDER — LISINOPRIL 5 MG PO TABS: 5.0000 mg | ORAL_TABLET | Freq: Every day | ORAL | 3 refills | Status: DC

## 2017-03-06 NOTE — Telephone Encounter (Signed)
Thanks Erasmo Downer - she is likely salt-sensitive. I agree with going back up on lisinopril.  Dr. Lemmie Evens

## 2017-03-06 NOTE — Telephone Encounter (Signed)
LMOM - following up on high BP reading from last night.

## 2017-03-06 NOTE — Telephone Encounter (Signed)
Spoke with patient - has had swelling in feet/legs over past week, she believes that this has to do with eating hot dogs last Saturday.  Blood pressure has gon up as high as 170 several times in past week.  Was advised to increase lisinopril yesterday only to 5 mg by on call MD.    Today she is still noting elevated pressures (was 170/95 this am).  Asked her to take lisinopril 5 mg daily and scheduled her for OV with PharmD next Thursday.  She will bring her BP cuff and list of readings.  She knows to call before then should she have problems.

## 2017-03-06 NOTE — Telephone Encounter (Signed)
Follow up   Pt is returning call    

## 2017-03-08 ENCOUNTER — Telehealth: Payer: Self-pay | Admitting: Adult Health

## 2017-03-08 NOTE — Telephone Encounter (Signed)
Noted.  Pt. to call for appt/fim

## 2017-03-08 NOTE — Telephone Encounter (Signed)
I called the patient. Patient states that she does not feel that her symptoms are being adequately treated. She still in significant amount of pain throughout the day. She states that she's had a heart attack since seeing Dr. Felecia Shelling. She also feels that no one is taking her symptoms seriously. She also reports that she has not relayed this information to Dr. Felecia Shelling. I advised that she should make a sooner appointment to discuss these symptoms with him. She agrees with this plan. Reports that she will call the office at her convenience to schedule a sooner appointment.

## 2017-03-08 NOTE — Telephone Encounter (Signed)
I'll be happy to see her back to discuss other treatment options

## 2017-03-08 NOTE — Telephone Encounter (Signed)
Noted/fim 

## 2017-03-12 ENCOUNTER — Encounter: Payer: Self-pay | Admitting: Neurology

## 2017-03-12 ENCOUNTER — Ambulatory Visit (INDEPENDENT_AMBULATORY_CARE_PROVIDER_SITE_OTHER): Payer: Federal, State, Local not specified - PPO | Admitting: Neurology

## 2017-03-12 VITALS — BP 131/88 | HR 68 | Resp 16 | Ht 61.5 in | Wt 138.0 lb

## 2017-03-12 DIAGNOSIS — R269 Unspecified abnormalities of gait and mobility: Secondary | ICD-10-CM | POA: Diagnosis not present

## 2017-03-12 DIAGNOSIS — R208 Other disturbances of skin sensation: Secondary | ICD-10-CM | POA: Insufficient documentation

## 2017-03-12 DIAGNOSIS — G373 Acute transverse myelitis in demyelinating disease of central nervous system: Secondary | ICD-10-CM

## 2017-03-12 MED ORDER — LAMOTRIGINE 100 MG PO TABS: ORAL_TABLET | ORAL | 5 refills | Status: DC

## 2017-03-12 MED ORDER — BACLOFEN 10 MG PO TABS: ORAL_TABLET | ORAL | 11 refills | Status: DC

## 2017-03-12 MED ORDER — GABAPENTIN 300 MG PO CAPS: ORAL_CAPSULE | ORAL | 11 refills | Status: DC

## 2017-03-12 NOTE — Patient Instructions (Signed)
The pharmacy has the prescription for lamotrigine 100 mg tablets. For 5 days, just take one half pill a day. For the next 5 days, take one half pill twice a day. For the next 5 days, take one half pill 3 times a day Then start taking one pill twice a day from this point on.    In the future, we may increase the dose further.  If you get a rash, need to stop the medication and not take it again. 

## 2017-03-12 NOTE — Progress Notes (Signed)
[facility]  PATIENT: Anna Henson DOB: 05/15/1971  REFERRING DOCTOR OR PCP:  Rikki Spearing _________________________________   HISTORICAL  CHIEF COMPLAINT:  Chief Complaint  Patient presents with  . Transverse Myelitis    Pt. sts. dysesthesias ar abouut 50% improved since increasing Gabapentin to 300-300-600.  She does not like to take Tramadol due to drowsiness it causes.  Sts. she continues to see behavioral health--has been dx. with bipolar disorder and PTSD. Mother is here with pt. today/fim    HISTORY OF PRESENT ILLNESS:  Antoninette Lerner is a 46 yo woman with a transverse myelitis in late January 2017.        Transverse myelitis: In January 2017, she had the onset of numbness on the right and was found to have an enhancing cervical spine lesion on the MRI extending from the lower medulla on the right to C5.   Symptoms improved some afterwards.   In April, she reported more numbness and repeat imaging was performed.  The 02/02/16 cervical spine shows resolution of the prior enhancing focus that was seen at the cervicomedullary junction.    Dysesthesia:   She reports a dysesthetic sensation on the right with heat and stabbing and sometimes gets a milder sensation on the left in the arm.   Gabapentin helps the pain some.    She is on 300-300-600 mg over the day.    She now rarely gets a Lhermitte sign       Spasms:  She is experiencing muscles spasms on both sides, right > left.  She gets a Programmer, applications Horse spasm in the legs, right > left.   Rarely, she gets them in both legs.   These are worse when she lays down at night.   She gets milder leg spasms in her legs > arms during the day.     She takes baclofen 20 mg at night and it helps reduce spasms some.   She wakes up without grogginess if she takes an hour before bedtime.     Tramadol helps pain but causes a hangover.   Baclofen helps better  Gait/balance/strength    She notes balance is poor at times.  She stumbles some  but no recent falls.    The right leg is mildly clumsy.    She notes the right hand trembles some and feels clumsy.   She sometimes drops items and has poor handwriting.  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   She notes typing speed dropped from 65 WPM to 45 WPM since the transverse melitis.    Bladder:   She denies urinary frequency.  No incontinence.  No hesitancy.     Cognitive/mood:   She feels she is not doing as well cognitively.   She was recently diagnosed with bipolar disease and PTSD.    She is on Abilify.     Sleep:   She falls asleep well most nights but always has trouble staying asleep.   She goes to bed around 10-11 pm.  Spasms usually awakens her multiple times, usually between 2 and 5 am.    Cardiac:   She had an MI 12/2016.   She has a RCA blockage and is on Brilinta.   She wore an even monitor and she reports it showed increased HR at night.     Labwork and the lupus anticoagulant and antiphospholipid tests were normal.  Transverse myelitis history:    In late January,  she had a right sided headache and went to bed.  When she woke up, she noted numbness in the right neck, shoulder and arm.   Numbness worsened over the next fe days.   She went to her PCP an was told she might have sinusitis and Amoxicillin was prescribed.   After a couple more days, she saw another doctor and had a neck soft tissue CT scan.   That CT scan showed that she had an increase of adenoid tissue and reactive type lymph nodes.  By that time, numbness extended to the fingertips on the riht but not into her leg.   She was told to go to the emergency room for further evaluation. In the emergency room she had an MRI of the cervical spine and the brain. The MRI of the cervical spine showed a focus that extended from the lower medulla on the right down to C5 posteriorly and more medially within the spinal cord. She also had some enhancement around the level of the cervical medullary junction.  She was admitted to the hospital for further treatment and testing.    She was treated with 5 days of IV Solu-Medrol. During that time, her symptoms of numbness improved.  She has not returned to baseline.    She had many tests performed including lumbar puncture for CSF analysis and a thoracic spine MRI and serology blood tests. For the most part, the evaluation was negative.  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 The enhancement does not extend up to the medulla.  Cervical spine also showed mild spinal stenosis at C3-C4 and mild to moderate spinal stenosis at C5-C6 and mild spinal stenosis at C6-C7. There is some foraminal narrowing at those levels but no definite nerve root compression.  Elsewhere the brain, there were some subcortical and deep white matter T2/FLAIR hyperintense foci but not in a pattern that would be typical for MS. The thoracic spine was normal.    I also reviewed her many laboratory tests performed while she was at [employer]. Neuromyelitis optica antibodies are negative. She did not have oligoclonal bands. ANCA, ANA, SSA/SSB, ACE and Vit B-12 were all normal or negative.  She had Epstein-Barr IgG but not IgM. CSF cell counts did not show increase in white blood cells.  REVIEW OF SYSTEMS: Constitutional: No fevers, chills, sweats, or change in appetite.  She notes some fatigue Eyes: No visual changes, double vision, eye pain Ear, nose and throat: No hearing loss, ear pain, nasal congestion, sore throat Cardiovascular: No chest pain, palpitations.   H/O angina, CAD with stent Respiratory: No shortness of breath at rest or with exertion.   No wheezes GastrointestinaI: No nausea, vomiting, diarrhea, abdominal pain, fecal incontinence Genitourinary: Mild urgency and frequency.  No nocturia. Musculoskeletal: No neck pain, back pain Integumentary: No rash, pruritus, skin lesions Neurological: as above Psychiatric: No  depression at this time.  No anxiety Endocrine: No palpitations, diaphoresis, change in appetite, change in weigh or increased thirst Hematologic/Lymphatic: No anemia, purpura, petechiae. Allergic/Immunologic: No itchy/runny eyes, nasal congestion, recent allergic reactions, rashes  ALLERGIES: No Known Allergies  HOME MEDICATIONS:  Current Outpatient Prescriptions:  .  ARIPiprazole (ABILIFY) 5 MG tablet, Take 1 tablet (5 mg total) by mouth daily., Disp: 90 tablet, Rfl: 0 .  aspirin 81 MG chewable tablet, Chew 1 tablet (81 mg total) by mouth daily., Disp: , Rfl:  .  atorvastatin (LIPITOR) 80  MG tablet, TAKE 1 TABLET BY MOUTH EVERY DAY, Disp: 30 tablet, Rfl: 0 .  baclofen (LIORESAL) 10 MG tablet, Take up to 4/day for muscle spasms, Disp: 120 each, Rfl: 11 .  fluticasone (FLONASE) 50 MCG/ACT nasal spray, SHAKE LIQUID AND USE 2 SPRAYS IN EACH NOSTRIL DAILY, Disp: 16 g, Rfl: 5 .  gabapentin (NEURONTIN) 300 MG capsule, Take one pill in the morning, two in the afternoon and two po at bedtime, Disp: 150 capsule, Rfl: 11 .  lisinopril (PRINIVIL,ZESTRIL) 5 MG tablet, Take 1 tablet (5 mg total) by mouth daily., Disp: 30 tablet, Rfl: 3 .  metoprolol (LOPRESSOR) 50 MG tablet, Take 0.5 tablets (25 mg total) by mouth 2 (two) times daily. TAKE 1 TABLET BY MOUTH TWICE DAILY., Disp: 90 tablet, Rfl: 1 .  pantoprazole (PROTONIX) 40 MG tablet, Take 1 tablet (40 mg total) by mouth 2 (two) times daily., Disp: 30 tablet, Rfl: 6 .  phenazopyridine (PYRIDIUM) 200 MG tablet, Take 1 tablet (200 mg total) by mouth 3 (three) times daily as needed for pain., Disp: 10 tablet, Rfl: 0 .  ticagrelor (BRILINTA) 90 MG TABS tablet, Take 1 tablet (90 mg total) by mouth 2 (two) times daily., Disp: 180 tablet, Rfl: 3 .  traMADol (ULTRAM) 50 MG tablet, Take 1 tablet (50 mg total) by mouth 4 (four) times daily as needed., Disp: 120 tablet, Rfl: 4 .  triamterene-hydrochlorothiazide (DYAZIDE) 37.5-25 MG capsule, Take 1 each (1 capsule  total) by mouth daily., Disp: 90 capsule, Rfl: 3 .  lamoTRIgine (LAMICTAL) 100 MG tablet, Take as directed and then take one po bid, Disp: 60 tablet, Rfl: 5 .  nitroGLYCERIN (NITROSTAT) 0.4 MG SL tablet, Place 1 tablet (0.4 mg total) under the tongue every 5 (five) minutes as needed for chest pain., Disp: 25 tablet, Rfl: 1 No current facility-administered medications for this visit.   Facility-Administered Medications Ordered in Other Visits:  .  gadopentetate dimeglumine (MAGNEVIST) injection 16 mL, 16 mL, Intravenous, Once PRN, Irmgard Rampersaud, Nanine Means, MD  PAST MEDICAL HISTORY: Past Medical History:  Diagnosis Date  . CAD in native artery    a. 09/2014 Cath/PCI in setting of [place] - s/p 2.25 x 12 mm Promus Premier DES to the RCA;  b. 11/2016 Myoview: EF 56%, no ischemia;  c. 12/2016 NSTEMI/PCI: LM nl, LAd 25p, LCX 30p, RCA 100p (2.75x38 Promus Premier DES), mRCA 10 ISR, EF 50-55%.  . Chronic kidney disease    she sthinks has stage 2 CKD, has had US doppler and has some stenosis , this was done in [place]   . CKD (chronic kidney disease), stage II   . HOCM (hypertrophic obstructive cardiomyopathy) ([place])    a. 12/2016 Echo: EF 65-70%,  mod conc LVH, dynamic obstruction @ rest, peak velocity of 291 cm/sec w/ peak gradient of 2mmHg, no rwma, Gr1 DD, triv TR, PASP 7mmHg.  Marland Kitchen Hyperlipidemia   . Hypertension    > 5 years  . Palpitations   . Tobacco abuse 09/13/2014    PAST SURGICAL HISTORY: Past Surgical History:  Procedure Laterality Date  . CORONARY ANGIOPLASTY WITH STENT PLACEMENT     L main OK, LAD OK, CFX 30%, RCA 90%-0% w/ 2.25 x 12 mm Promus Premier DES, EF 55%  . CORONARY/GRAFT ACUTE MI REVASCULARIZATION N/A 12/31/2016   Procedure: Coronary/Graft Acute MI Revascularization;  Surgeon: Sherren Mocha, MD;  Location: [place] [facility];  Service: Cardiovascular;  Laterality: N/A;  . HERNIA REPAIR    . LEFT HEART CATH  AND CORONARY ANGIOGRAPHY N/A 12/31/2016   Procedure: Left Heart Cath and  Coronary Angiography;  Surgeon: Sherren Mocha, MD;  Location: [place] [facility];  Service: Cardiovascular;  Laterality: N/A;  . LEFT HEART CATHETERIZATION WITH CORONARY ANGIOGRAM N/A 09/14/2014   Procedure: LEFT HEART CATHETERIZATION WITH CORONARY ANGIOGRAM;  Surgeon: Burnell Blanks, MD;  Location: [facility] C[facility];  Service: Cardiovascular;  Laterality: N/A;  . PERCUTANEOUS CORONARY STENT INTERVENTION (PCI-S)  09/14/2014   Procedure: PERCUTANEOUS CORONARY STENT INTERVENTION (PCI-S);  Surgeon: Burnell Blanks, MD;  Location: [facility] CATH LAB;  Service: Cardiovascular;;  . TONSILLECTOMY  2005   10 years ago    FAMILY HISTORY: Family History  Problem Relation Age of Onset  . Diabetes Mother   . Heart disease Mother   . Hyperlipidemia Mother   . Hypertension Mother   . Lung cancer Father   . Drug abuse Father   . Drug abuse Sister   . Mental illness Brother   . Mental illness Maternal Grandmother     SOCIAL HISTORY:  Social History   Social History  . Marital status: Single    Spouse name: N/A  . Number of children: 0  . Years of education: masters   Occupational History  .      [employer]   Social History Main Topics  . Smoking status: Former Smoker    Packs/day: 0.50    Types: Cigarettes  . Smokeless tobacco: Never Used     Comment: on patch since 09/09/14  . Alcohol use No  . Drug use: No  . Sexual activity: No   Other Topics Concern  . Not on file   Social History Narrative   Consumes 2 cups of caffeine daily     PHYSICAL EXAM  Vitals:   03/12/17 0824  BP: 131/88  Pulse: 68  Resp: 16  Weight: 138 lb (62.6 kg)  Height: 5' 1.5" (1.562 m)    Body mass index is 25.65 kg/m.   General: The patient is well-developed and well-nourished and in no acute distress  Neurologic Exam  Mental status: The patient is alert and oriented x 3 at the time of the examination. The patient has apparent normal recent and remote memory, with an apparently normal  attention span and concentration ability.   Speech is normal.  Cranial nerves: Extraocular movements are full.   Facial strength is normal.  Trapezius and sternocleidomastoid strength is normal. No dysarthria is noted.    No obvious hearing deficits are noted.  Motor:  Muscle bulk is normal.   Tone is slightly increased in right leg. Strength is  5 / 5 in all 4 extremities.   Sensory: She reports hyperpathia to vibration in the right leg but touch is more symmetric now.     Gait and station: Station is normal.   Gait is slightly wide and slightly spastic (right). Tandem gait is mildly wide.    Reflexes: Deep tendon reflexes are symmetric and normal bilaterally.        DIAGNOSTIC DATA (LABS, IMAGING, TESTING) - I reviewed patient records, labs, notes, testing and imaging myself where available.  Lab Results  Component Value Date   WBC 14.0 (H) 01/01/2017   HGB 11.3 (L) 01/01/2017   HCT 32.2 (L) 01/01/2017   MCV 89.4 01/01/2017   PLT 331 01/01/2017      Component Value Date/Time   NA 143 01/22/2017 1612   K 3.8 01/22/2017 1612   CL 99 01/22/2017 1612  CO2 23 01/22/2017 1612   GLUCOSE 85 01/22/2017 1612   GLUCOSE 82 01/10/2017 1501   BUN 12 01/22/2017 1612   CREATININE 1.30 (H) 01/22/2017 1612   CREATININE 1.41 (H) 01/10/2017 1501   CALCIUM 10.0 01/22/2017 1612   PROT 5.8 (L) 12/31/2016 0643   ALBUMIN 3.5 12/31/2016 0643   AST 98 (H) 12/31/2016 0643   ALT 27 12/31/2016 0643   ALKPHOS 48 12/31/2016 0643   BILITOT 0.3 12/31/2016 0643   GFRNONAA 50 (L) 01/22/2017 1612   GFRNONAA 56 (L) 11/24/2015 1113   GFRAA 58 (L) 01/22/2017 1612   GFRAA 65 11/24/2015 1113   Lab Results  Component Value Date   CHOL 139 12/31/2016   HDL 36 (L) 12/31/2016   LDLCALC 83 12/31/2016   TRIG 100 12/31/2016   CHOLHDL 3.9 12/31/2016   Lab Results  Component Value Date   HGBA1C 5.8 02/22/2016   Lab Results  Component Value Date   VITAMINB12 541 11/12/2015   Lab Results  Component  Value Date   TSH 1.648 12/31/2016       ASSESSMENT AND PLAN  Transverse myelitis (HCC)  Gait disturbance  Dysesthesia    1.   Lamotrigine titrate to 100 bid. 2.   Continue gabapentin 300 - 300 - 600.     Can increase if lamotrigine not benefcial 3.   Continue to be active, exercise 4.   Continue baclofen in the evening/night 5.   She will return to see me in 4 months or sooner if there are new or worsening neurologic symptoms.  Time with patient:  833 am to 908 am  Willadeen Colantuono A. Felecia Shelling, MD, PhD 01/15/2009, 0:71 AM Certified in Neurology, Clinical Neurophysiology, Sleep Medicine, Pain Medicine and Neuroimaging  [facility] [address] [place], [place] [address] [phone]

## 2017-03-15 ENCOUNTER — Ambulatory Visit (INDEPENDENT_AMBULATORY_CARE_PROVIDER_SITE_OTHER): Payer: Federal, State, Local not specified - PPO | Admitting: Pharmacist

## 2017-03-15 ENCOUNTER — Other Ambulatory Visit (HOSPITAL_COMMUNITY): Payer: Self-pay | Admitting: Psychiatry

## 2017-03-15 VITALS — BP 102/60 | HR 61

## 2017-03-15 DIAGNOSIS — F319 Bipolar disorder, unspecified: Secondary | ICD-10-CM

## 2017-03-15 DIAGNOSIS — I10 Essential (primary) hypertension: Secondary | ICD-10-CM | POA: Diagnosis not present

## 2017-03-15 DIAGNOSIS — F4310 Post-traumatic stress disorder, unspecified: Secondary | ICD-10-CM

## 2017-03-15 MED ORDER — LISINOPRIL 2.5 MG PO TABS: 2.5000 mg | ORAL_TABLET | Freq: Every day | ORAL | 3 refills | Status: DC

## 2017-03-15 NOTE — Progress Notes (Signed)
Patient ID: Anna Henson                 DOB: 1970/10/22                      MRN: [id number]     HPI: Anna Henson is a 46 y.o. female referred by Dr. Debara Pickett to HTN clinic.  PMH includes NSTEMI  s/p PCI in 12/2016,  CAD s/p stent placement in 09/2014, CKD stage 2, hyperlipidemia, hypertension, a bipolar disorder.  Patient recently had multiple days of elevated BP readings and increased edema after eating high sodium foods on the weekend (2 hot dogs, chips and burger).  Her Lisinopril was increased from 2.5mg  to 5mg  daily on 03/05/17 and instructed to follow up with HTN clinic as soon as possible.  Patient presents to clinic accompany by her mother. Reports low BP readings of 98/60 after taking Lisinopril 5mg  for couple of days.  Denies swelling, falls, or shortness of breath. Reports some dizziness associated with 1 or 2 low BP readings, but improved. Also, reports some chest discomfort since last stent placement.  She decreased her Lisinopril back down to 2.5mg  daily when BP stated to get low, and sometime she holds her metoprolol evening dose if BP <182 systolic before taking the medication.  Current HTN meds:  Lisinopril 2.5mg  daily Metoprolol 25mg  twice daily Triamterene/HCTZ  37.5/25 daily  BP goal: <130/80  Family History: all siblings with hypertension;   Diet: currently avoiding all high sodium foods  Home BP readings: no records available *Arm BP cuff calibrated during visit at determined to be accurate within 72mmHg from manual reading*  Wt Readings from Last 3 Encounters:  03/12/17 138 lb (62.6 kg)  02/23/17 142 lb 3.2 oz (64.5 kg)  01/19/17 142 lb (64.4 kg)   BP Readings from Last 3 Encounters:  03/15/17 102/60  03/12/17 131/88  02/23/17 111/72   Pulse Readings from Last 3 Encounters:  03/15/17 61  03/12/17 68  02/23/17 77     Past Medical History:  Diagnosis Date  . CAD in native artery    a. 09/2014 Cath/PCI in setting of [place] - s/p 2.25 x 12 mm Promus Premier  DES to the RCA;  b. 11/2016 Myoview: EF 56%, no ischemia;  c. 12/2016 NSTEMI/PCI: LM nl, LAd 25p, LCX 30p, RCA 100p (2.75x38 Promus Premier DES), mRCA 10 ISR, EF 50-55%.  . Chronic kidney disease    she sthinks has stage 2 CKD, has had US doppler and has some stenosis , this was done in [place]   . CKD (chronic kidney disease), stage II   . HOCM (hypertrophic obstructive cardiomyopathy) ([place])    a. 12/2016 Echo: EF 65-70%,  mod conc LVH, dynamic obstruction @ rest, peak velocity of 291 cm/sec w/ peak gradient of 74mmHg, no rwma, Gr1 DD, triv TR, PASP 74mmHg.  Marland Kitchen Hyperlipidemia   . Hypertension    > 5 years  . Palpitations   . Tobacco abuse 09/13/2014    Current Outpatient Prescriptions on File Prior to Visit  Medication Sig Dispense Refill  . ARIPiprazole (ABILIFY) 5 MG tablet Take 1 tablet (5 mg total) by mouth daily. 90 tablet 0  . aspirin 81 MG chewable tablet Chew 1 tablet (81 mg total) by mouth daily.    Marland Kitchen atorvastatin (LIPITOR) 80 MG tablet TAKE 1 TABLET BY MOUTH EVERY DAY 30 tablet 0  . baclofen (LIORESAL) 10 MG tablet Take up to 4/day for muscle spasms 120 each  11  . fluticasone (FLONASE) 50 MCG/ACT nasal spray SHAKE LIQUID AND USE 2 SPRAYS IN EACH NOSTRIL DAILY 16 g 5  . gabapentin (NEURONTIN) 300 MG capsule Take one pill in the morning, two in the afternoon and two po at bedtime 150 capsule 11  . lamoTRIgine (LAMICTAL) 100 MG tablet Take as directed and then take one po bid 60 tablet 5  . metoprolol (LOPRESSOR) 50 MG tablet Take 0.5 tablets (25 mg total) by mouth 2 (two) times daily. TAKE 1 TABLET BY MOUTH TWICE DAILY. 90 tablet 1  . nitroGLYCERIN (NITROSTAT) 0.4 MG SL tablet Place 1 tablet (0.4 mg total) under the tongue every 5 (five) minutes as needed for chest pain. 25 tablet 1  . pantoprazole (PROTONIX) 40 MG tablet Take 1 tablet (40 mg total) by mouth 2 (two) times daily. 30 tablet 6  . ticagrelor (BRILINTA) 90 MG TABS tablet Take 1 tablet (90 mg total) by mouth 2 (two) times  daily. 180 tablet 3  . traMADol (ULTRAM) 50 MG tablet Take 1 tablet (50 mg total) by mouth 4 (four) times daily as needed. 120 tablet 4  . triamterene-hydrochlorothiazide (DYAZIDE) 37.5-25 MG capsule Take 1 each (1 capsule total) by mouth daily. 90 capsule 3   Current Facility-Administered Medications on File Prior to Visit  Medication Dose Route Frequency Provider Last Rate Last Dose  . gadopentetate dimeglumine (MAGNEVIST) injection 16 mL  16 mL Intravenous Once PRN Sater, Richard A, MD        No Known Allergies  Blood pressure 102/60, pulse 61.   Essential hypertension: Blood pressure today is at goal with HR at 61 bpm. Patient reports 1 - 2 episodes of symptomatic hypotension, and self-adjustment of Lisinopril dose couple of days ago.    Will decrease Lisinopril from 5mg  to 2.5mg  daily, and continue all other medication as prescribed. Okay for patient to hold metoprolol evening dose (for now) if systolic BP <616 prior to medication administration.  Patient to monitor BP every morning and every evening to bring records to next clinic follow up in 4 weeks. Once we get better information about daily BP readings we can adjust metoprolol and possibly cut her diuretic dose in half if needed.   Badr Piedra Rodriguez-Guzman PharmD, [place] [phone] 03/15/2017 10:08 AM

## 2017-03-15 NOTE — Patient Instructions (Addendum)
Return for a follow up appointment in 4 weeks  Your blood pressure today is 102/60 pulse 61  Check your blood pressure at home daily (if able) and keep record of the readings.  Take your BP meds as follows: **ALL medication as prescribed**  Bring your record of home blood pressures to your next appointment.  Exercise as you're able, try to walk approximately 30 minutes per day.  Keep salt intake to a minimum, especially watch canned and prepared boxed foods.  Eat more fresh fruits and vegetables and fewer canned items.  Avoid eating in fast food restaurants.    HOW TO TAKE YOUR BLOOD PRESSURE: . Rest 5 minutes before taking your blood pressure. .  Don't smoke or drink caffeinated beverages for at least 30 minutes before. . Take your blood pressure before (not after) you eat. . Sit comfortably with your back supported and both feet on the floor (don't cross your legs). . Elevate your arm to heart level on a table or a desk. . Use the proper sized cuff. It should fit smoothly and snugly around your bare upper arm. There should be enough room to slip a fingertip under the cuff. The bottom edge of the cuff should be 1 inch above the crease of the elbow. . Ideally, take 3 measurements at one sitting and record the average.

## 2017-03-19 ENCOUNTER — Other Ambulatory Visit (HOSPITAL_COMMUNITY): Payer: Self-pay | Admitting: Psychiatry

## 2017-03-19 DIAGNOSIS — F319 Bipolar disorder, unspecified: Secondary | ICD-10-CM

## 2017-03-19 DIAGNOSIS — F4310 Post-traumatic stress disorder, unspecified: Secondary | ICD-10-CM

## 2017-03-19 NOTE — Telephone Encounter (Signed)
Given 90 days in May.  Too soon to refill

## 2017-03-26 ENCOUNTER — Other Ambulatory Visit: Payer: Self-pay | Admitting: Radiology

## 2017-03-28 ENCOUNTER — Telehealth: Payer: Self-pay | Admitting: *Deleted

## 2017-03-28 NOTE — Telephone Encounter (Signed)
Confirmed [facility] for 6.27.18 at 0815 .  Instructions and contact information given.

## 2017-03-29 ENCOUNTER — Encounter: Payer: Self-pay | Admitting: *Deleted

## 2017-03-29 ENCOUNTER — Other Ambulatory Visit: Payer: Self-pay | Admitting: *Deleted

## 2017-03-29 ENCOUNTER — Ambulatory Visit (HOSPITAL_COMMUNITY): Payer: Self-pay | Admitting: Psychiatry

## 2017-03-29 DIAGNOSIS — C50511 Malignant neoplasm of lower-outer quadrant of right female breast: Secondary | ICD-10-CM | POA: Insufficient documentation

## 2017-03-29 DIAGNOSIS — Z170 Estrogen receptor positive status [ER+]: Principal | ICD-10-CM

## 2017-04-04 ENCOUNTER — Telehealth: Payer: Self-pay | Admitting: Internal Medicine

## 2017-04-04 ENCOUNTER — Encounter: Payer: Self-pay | Admitting: *Deleted

## 2017-04-04 ENCOUNTER — Other Ambulatory Visit (HOSPITAL_BASED_OUTPATIENT_CLINIC_OR_DEPARTMENT_OTHER): Payer: Federal, State, Local not specified - PPO

## 2017-04-04 ENCOUNTER — Encounter: Payer: Self-pay | Admitting: Hematology and Oncology

## 2017-04-04 ENCOUNTER — Encounter: Payer: Self-pay | Admitting: Physical Therapy

## 2017-04-04 ENCOUNTER — Other Ambulatory Visit: Payer: Self-pay | Admitting: *Deleted

## 2017-04-04 ENCOUNTER — Ambulatory Visit: Payer: Federal, State, Local not specified - PPO | Attending: General Surgery | Admitting: Physical Therapy

## 2017-04-04 ENCOUNTER — Ambulatory Visit
Admission: RE | Admit: 2017-04-04 | Discharge: 2017-04-04 | Disposition: A | Discharge status: Home / Self Care | Payer: Federal, State, Local not specified - PPO | Source: Ambulatory Visit | Attending: Radiation Oncology | Admitting: Radiation Oncology

## 2017-04-04 ENCOUNTER — Ambulatory Visit (HOSPITAL_BASED_OUTPATIENT_CLINIC_OR_DEPARTMENT_OTHER): Payer: Federal, State, Local not specified - PPO | Admitting: Hematology and Oncology

## 2017-04-04 DIAGNOSIS — M79602 Pain in left arm: Secondary | ICD-10-CM | POA: Insufficient documentation

## 2017-04-04 DIAGNOSIS — C50511 Malignant neoplasm of lower-outer quadrant of right female breast: Secondary | ICD-10-CM

## 2017-04-04 DIAGNOSIS — M79601 Pain in right arm: Secondary | ICD-10-CM | POA: Insufficient documentation

## 2017-04-04 DIAGNOSIS — R293 Abnormal posture: Secondary | ICD-10-CM | POA: Diagnosis present

## 2017-04-04 DIAGNOSIS — Z87891 Personal history of nicotine dependence: Secondary | ICD-10-CM

## 2017-04-04 DIAGNOSIS — G0489 Other myelitis: Secondary | ICD-10-CM

## 2017-04-04 DIAGNOSIS — Z170 Estrogen receptor positive status [ER+]: Secondary | ICD-10-CM

## 2017-04-04 DIAGNOSIS — Z800 Family history of malignant neoplasm of digestive organs: Secondary | ICD-10-CM | POA: Diagnosis not present

## 2017-04-04 LAB — CBC WITH DIFFERENTIAL/PLATELET
BASO%: 0.7 % (ref 0.0–2.0)
Basophils Absolute: 0.1 10*3/uL (ref 0.0–0.1)
EOS%: 2.1 % (ref 0.0–7.0)
Eosinophils Absolute: 0.2 10*3/uL (ref 0.0–0.5)
HCT: 33.5 % — ABNORMAL LOW (ref 34.8–46.6)
HGB: 11.6 g/dL (ref 11.6–15.9)
LYMPH%: 23.9 % (ref 14.0–49.7)
MCH: 31.5 pg (ref 25.1–34.0)
MCHC: 34.7 g/dL (ref 31.5–36.0)
MCV: 90.8 fL (ref 79.5–101.0)
MONO#: 0.6 10*3/uL (ref 0.1–0.9)
MONO%: 6.8 % (ref 0.0–14.0)
NEUT#: 6 10*3/uL (ref 1.5–6.5)
NEUT%: 66.5 % (ref 38.4–76.8)
Platelets: 352 10*3/uL (ref 145–400)
RBC: 3.69 10*6/uL — ABNORMAL LOW (ref 3.70–5.45)
RDW: 14.8 % — ABNORMAL HIGH (ref 11.2–14.5)
WBC: 9 10*3/uL (ref 3.9–10.3)
lymph#: 2.1 10*3/uL (ref 0.9–3.3)

## 2017-04-04 LAB — COMPREHENSIVE METABOLIC PANEL
ALT: 22 U/L (ref 0–55)
AST: 23 U/L (ref 5–34)
Albumin: 3.9 g/dL (ref 3.5–5.0)
Alkaline Phosphatase: 69 U/L (ref 40–150)
Anion Gap: 9 mEq/L (ref 3–11)
BUN: 13.9 mg/dL (ref 7.0–26.0)
CO2: 25 mEq/L (ref 22–29)
Calcium: 10 mg/dL (ref 8.4–10.4)
Chloride: 105 mEq/L (ref 98–109)
Creatinine: 1.3 mg/dL — ABNORMAL HIGH (ref 0.6–1.1)
EGFR: 59 mL/min/{1.73_m2} — ABNORMAL LOW (ref >90)
Glucose: 83 mg/dl (ref 70–140)
Potassium: 3.6 mEq/L (ref 3.5–5.1)
Sodium: 139 mEq/L (ref 136–145)
Total Bilirubin: <0.22 mg/dL (ref 0.20–1.20)
Total Protein: 6.7 g/dL (ref 6.4–8.3)

## 2017-04-04 MED ORDER — TAMOXIFEN CITRATE 20 MG PO TABS: 20.0000 mg | ORAL_TABLET | Freq: Every day | ORAL | 3 refills | Status: DC

## 2017-04-04 MED ORDER — PRASUGREL HCL 10 MG PO TABS: 10.0000 mg | ORAL_TABLET | Freq: Every day | ORAL | 1 refills | Status: AC

## 2017-04-04 NOTE — Progress Notes (Signed)
[place] NOTE  Patient Care Team: Wardell Honour, MD as PCP - General (Family Medicine) Felecia Shelling, Nanine Means, MD as Consulting Physician (Neurology) Jovita Kussmaul, MD as Consulting Physician (General Surgery) Nicholas Lose, MD as Consulting Physician (Hematology and Oncology) Kyung Rudd, MD as Consulting Physician (Radiation Oncology)  CHIEF COMPLAINTS/PURPOSE OF CONSULTATION:  Newly diagnosed breast cancer  HISTORY OF PRESENTING ILLNESS:  Anna Henson 46 y.o. female is here because of recent diagnosis of right breast cancer. Patient's primary care physician felt a palpable lump in the right breast. Initial mammogram was normal. Ultrasound of the palpable area of concern at 8:00 position revealed a 3.2 cm irregular mass. Biopsy of this mass was performed which revealed a grade 2 invasive lobular cancer that is ER/PR positive HER-2 negative with a Ki-67 of 2%. She was presented this morning in the multidisciplinary tumor board and she is here today to discuss the treatment plan accompanied by her mother. 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  SUMMARY OF ONCOLOGIC HISTORY:   Malignant neoplasm of lower-outer quadrant of right breast of female, estrogen receptor positive ([place])   03/26/2017 Initial Diagnosis    Palpable right breast mass with a normal mammogram: 8:00 position by ultrasound 3.2 cm irregular mass: Grade 2 invasive lobular cancer ER 85%, PR 100%, Ki-67 2%, HER-2 negative ratio 1.5, T2 N0 stage II a clinical stage       MEDICAL HISTORY:  Past Medical History:  Diagnosis Date  . Anxiety   . CAD in native artery    a. 09/2014 Cath/PCI in setting of [place] -  s/p 2.25 x 12 mm Promus Premier DES to the RCA;  b. 11/2016 Myoview: EF 56%, no ischemia;  c. 12/2016 NSTEMI/PCI: LM nl, LAd 25p, LCX 30p, RCA 100p (2.75x38 Promus Premier DES), mRCA 10 ISR, EF 50-55%.  . Chronic kidney disease    she sthinks has stage 2 CKD, has had US doppler and has some stenosis , this was done in [place]   . CKD (chronic kidney disease), stage II   . Depression   . Heart attack ([place])   . HOCM (hypertrophic obstructive cardiomyopathy) ([place])    a. 12/2016 Echo: EF 65-70%,  mod conc LVH, dynamic obstruction @ rest, peak velocity of 291 cm/sec w/ peak gradient of 19mHg, no rwma, Gr1 DD, triv TR, PASP 159mg.  . Marland Kitchenyperlipidemia   . Hypertension    > 5 years  . Palpitations   . Tobacco abuse 09/13/2014  . Transverse myelitis [place]    SURGICAL HISTORY: Past Surgical History:  Procedure Laterality Date  . CORONARY ANGIOPLASTY WITH STENT PLACEMENT     L main OK, LAD OK, CFX 30%, RCA 90%-0% w/ 2.25 x 12 mm Promus Premier DES, EF 55%  . CORONARY/GRAFT ACUTE MI REVASCULARIZATION N/A 12/31/2016   Procedure: Coronary/Graft Acute MI Revascularization;  Surgeon: MiSherren MochaMD;  [facility];  Service: Cardiovascular;  Laterality: N/A;  . HERNIA REPAIR    . LEFT HEART CATH AND CORONARY ANGIOGRAPHY N/A 12/31/2016   Procedure: Left Heart Cath and Coronary Angiography;  Surgeon: MiSherren MochaMD;  [facility];  Service: Cardiovascular;  Laterality: N/A;  . LEFT  HEART CATHETERIZATION WITH CORONARY ANGIOGRAM N/A 09/14/2014   Procedure: LEFT HEART CATHETERIZATION WITH CORONARY ANGIOGRAM;  Surgeon: Burnell Blanks, MD;  Location: [facility] CATH LAB;  Service: Cardiovascular;  Laterality: N/A;  . PERCUTANEOUS CORONARY STENT INTERVENTION (PCI-S)  09/14/2014   Procedure: PERCUTANEOUS CORONARY STENT INTERVENTION (PCI-S);  Surgeon: Burnell Blanks, MD;  Location: [facility] CATH LAB;  Service: Cardiovascular;;  . TONSILLECTOMY  2005   10 years ago     SOCIAL HISTORY: Social History   Social History  . Marital status: Single    Spouse name: N/A  . Number of children: 0  . Years of education: masters   Occupational History  .      [employer]   Social History Main Topics  . Smoking status: Former Smoker    Packs/day: 0.50    Types: Cigarettes    Quit date: 10/05/2015  . Smokeless tobacco: Never Used     Comment: on patch since 09/09/14  . Alcohol use 0.0 oz/week     Comment: occas  . Drug use: No  . Sexual activity: No   Other Topics Concern  . Not on file   Social History Narrative   Consumes 2 cups of caffeine daily    FAMILY HISTORY: Family History  Problem Relation Age of Onset  . Diabetes Mother   . Heart disease Mother   . Hyperlipidemia Mother   . Hypertension Mother   . Lung cancer Father   . Drug abuse Father   . Drug abuse Sister   . Mental illness Brother   . Mental illness Maternal Grandmother   . Stomach cancer Paternal Grandmother     ALLERGIES:  has No Known Allergies.  MEDICATIONS:  Current Outpatient Prescriptions  Medication Sig Dispense Refill  . ARIPiprazole (ABILIFY) 5 MG tablet Take 1 tablet (5 mg total) by mouth daily. 90 tablet 0  . aspirin 81 MG chewable tablet Chew 1 tablet (81 mg total) by mouth daily.    Marland Kitchen atorvastatin (LIPITOR) 80 MG tablet TAKE 1 TABLET BY MOUTH EVERY DAY 30 tablet 0  . baclofen (LIORESAL) 10 MG tablet Take up to 4/day for muscle spasms 120 each 11  . fluticasone (FLONASE) 50 MCG/ACT nasal spray SHAKE LIQUID AND USE 2 SPRAYS IN EACH NOSTRIL DAILY 16 g 5  . gabapentin (NEURONTIN) 300 MG capsule Take one pill in the morning, two in the afternoon and two po at bedtime 150 capsule 11  . lisinopril (PRINIVIL,ZESTRIL) 2.5 MG tablet Take 1 tablet (2.5 mg total) by mouth daily. 90 tablet 3  . metoprolol (LOPRESSOR) 50 MG tablet Take 0.5 tablets (25 mg total) by mouth 2 (two) times daily. TAKE 1 TABLET BY MOUTH TWICE DAILY. 90 tablet 1  . nitroGLYCERIN (NITROSTAT)  0.4 MG SL tablet Place 1 tablet (0.4 mg total) under the tongue every 5 (five) minutes as needed for chest pain. 25 tablet 1  . OVER THE COUNTER MEDICATION 715 mg.    . OVER THE COUNTER MEDICATION Take 1 capsule by mouth daily.    . pantoprazole (PROTONIX) 40 MG tablet Take 1 tablet (40 mg total) by mouth 2 (two) times daily. 30 tablet 6  . ticagrelor (BRILINTA) 90 MG TABS tablet Take 1 tablet (90 mg total) by mouth 2 (two) times daily. 180 tablet 3  . traMADol (ULTRAM) 50 MG tablet Take 1 tablet (50 mg total) by mouth 4 (four) times daily as needed. 120 tablet 4  . triamterene-hydrochlorothiazide (DYAZIDE) 37.5-25 MG capsule Take 1  each (1 capsule total) by mouth daily. 90 capsule 3  . tamoxifen (NOLVADEX) 20 MG tablet Take 1 tablet (20 mg total) by mouth daily. 90 tablet 3   No current facility-administered medications for this visit.    Facility-Administered Medications Ordered in Other Visits  Medication Dose Route Frequency Provider Last Rate Last Dose  . gadopentetate dimeglumine (MAGNEVIST) injection 16 mL  16 mL Intravenous Once PRN Sater, Nanine Means, MD        REVIEW OF SYSTEMS:   Constitutional: Denies fevers, chills or abnormal night sweats Eyes: Denies blurriness of vision, double vision or watery eyes Ears, nose, mouth, throat, and face: Denies mucositis or sore throat Respiratory: Denies cough, dyspnea or wheezes Cardiovascular: Denies palpitation, chest discomfort or lower extremity swelling Gastrointestinal:  Denies nausea, heartburn or change in bowel habits Skin: Denies abnormal skin rashes Lymphatics: Denies new lymphadenopathy or easy bruising Neurological:Denies numbness, tingling or new weaknesses Behavioral/Psych: Mood is stable, no new changes  Breast: Palpable lump in the right breast All other systems were reviewed with the patient and are negative.  PHYSICAL EXAMINATION: ECOG PERFORMANCE STATUS: 1 - Symptomatic but completely ambulatory  Vitals:   04/04/17  0849  BP: 133/84  Pulse: 65  Resp: 17  Temp: 98.5 F (36.9 C)   Filed Weights   04/04/17 0849  Weight: 137 lb 14.4 oz (62.6 kg)    GENERAL:alert, no distress and comfortable SKIN: skin color, texture, turgor are normal, no rashes or significant lesions EYES: normal, conjunctiva are pink and non-injected, sclera clear OROPHARYNX:no exudate, no erythema and lips, buccal mucosa, and tongue normal  NECK: supple, thyroid normal size, non-tender, without nodularity LYMPH:  no palpable lymphadenopathy in the cervical, axillary or inguinal LUNGS: clear to auscultation and percussion with normal breathing effort HEART: regular rate & rhythm and no murmurs and no lower extremity edema ABDOMEN:abdomen soft, non-tender and normal bowel sounds Musculoskeletal:no cyanosis of digits and no clubbing  PSYCH: alert & oriented x 3 with fluent speech NEURO: no focal motor/sensory deficits BREAST palpable lump in the right breast No palpable axillary or supraclavicular lymphadenopathy (exam performed in the presence of a chaperone)   LABORATORY DATA:  I have reviewed the data as listed Lab Results  Component Value Date   WBC 9.0 04/04/2017   HGB 11.6 04/04/2017   HCT 33.5 (L) 04/04/2017   MCV 90.8 04/04/2017   PLT 352 04/04/2017   Lab Results  Component Value Date   NA 139 04/04/2017   K 3.6 04/04/2017   CL 99 01/22/2017   CO2 25 04/04/2017    RADIOGRAPHIC STUDIES: I have personally reviewed the radiological reports and agreed with the findings in the report.  ASSESSMENT AND PLAN:  Malignant neoplasm of lower-outer quadrant of right breast of female, estrogen receptor positive ([place]) 03/26/2017: Palpable right breast mass with a normal mammogram: 8:00 position by ultrasound 3.2 cm irregular mass: Grade 2 invasive lobular cancer ER 85%, PR 100%, Ki-67 2%, HER-2 negative ratio 1.5, T2 N0 stage II a clinical stage  Pathology and radiology counseling:Discussed with the patient, the details  of pathology including the type of breast cancer,the clinical staging, the significance of ER, PR and HER-2/neu receptors and the implications for treatment. After reviewing the pathology in detail, we proceeded to discuss the different treatment options between surgery, radiation, chemotherapy, antiestrogen therapies.  Recommendations: Breast MRI Genetics counseling  1. Breast conserving surgery followed by 2. Oncotype DX testing to determine if chemotherapy would be of any benefit followed by  3. Adjuvant radiation therapy followed by 4. Adjuvant antiestrogen therapy  Oncotype counseling: I discussed Oncotype DX test. I explained to the patient that this is a 21 gene panel to evaluate patient tumors DNA to calculate recurrence score. This would help determine whether patient has high risk or intermediate risk or low risk breast cancer. She understands that if her tumor was found to be high risk, she would benefit from systemic chemotherapy. If low risk, no need of chemotherapy. If she was found to be intermediate risk, we would need to evaluate the score as well as other risk factors and determine if an abbreviated chemotherapy may be of benefit.  Return to clinic after surgery to discuss final pathology report and then determine if Oncotype DX testing will need to be sent.     All questions were answered. The patient knows to call the clinic with any problems, questions or concerns.    Rulon Eisenmenger, MD 04/04/17

## 2017-04-04 NOTE — Assessment & Plan Note (Signed)
03/26/2017: Palpable right breast mass with a normal mammogram: 8:00 position by ultrasound 3.2 cm irregular mass: Grade 2 invasive lobular cancer ER 85%, PR 100%, Ki-67 2%, HER-2 negative ratio 1.5, T2 N0 stage II a clinical stage  Pathology and radiology counseling:Discussed with the patient, the details of pathology including the type of breast cancer,the clinical staging, the significance of ER, PR and HER-2/neu receptors and the implications for treatment. After reviewing the pathology in detail, we proceeded to discuss the different treatment options between surgery, radiation, chemotherapy, antiestrogen therapies.  Recommendations: Breast MRI Genetics counseling  1. Breast conserving surgery followed by 2. Oncotype DX testing to determine if chemotherapy would be of any benefit followed by 3. Adjuvant radiation therapy followed by 4. Adjuvant antiestrogen therapy  Oncotype counseling: I discussed Oncotype DX test. I explained to the patient that this is a 21 gene panel to evaluate patient tumors DNA to calculate recurrence score. This would help determine whether patient has high risk or intermediate risk or low risk breast cancer. She understands that if her tumor was found to be high risk, she would benefit from systemic chemotherapy. If low risk, no need of chemotherapy. If she was found to be intermediate risk, we would need to evaluate the score as well as other risk factors and determine if an abbreviated chemotherapy may be of benefit.  Return to clinic after surgery to discuss final pathology report and then determine if Oncotype DX testing will need to be sent.

## 2017-04-04 NOTE — Telephone Encounter (Signed)
Agree that tamoxifen may increase antiplatelet effect of Brillinta. Would advise to switch to Effient. Would replace the pm dose with 60 mg Effient x1, then start 10 mg Effient daily the next morning and stop Brillinta. Remain on low dose aspirin.   My nurse will reach out to her for a new Rx and directions on changing medications.  Thanks for notifying me.  Dr. Lemmie Evens

## 2017-04-04 NOTE — Progress Notes (Signed)
Clinical Social Work [facility] Psychosocial Distress Screening [place]  Patient completed distress screening protocol and scored a 7 on the Psychosocial Distress Thermometer which indicates moderate distress. Clinical Social Worker met with patient and patients family in [facility] to assess for distress and other psychosocial needs. Patient stated she was feeling overwhelmed but felt "better" after meeting with the treatment team and getting more information on her treatment plan. CSW and patient discussed common feeling and emotions when being diagnosed with cancer, and the importance of support during treatment. CSW informed patient of the support team and support services at [facility], and patient was agreeable to an [employer] referral. CSW provided contact information and encouraged patient to call with any questions or concerns.  ONCBCN DISTRESS SCREENING 04/04/2017  Screening Type Initial Screening  Distress experienced in past week (1-10) 7  Practical problem type Work/school  Emotional problem type Depression;Nervousness/Anxiety;Adjusting to illness  Spiritual/Religous concerns type Facing my mortality;Loss of sense of purpose  Information Concerns Type Lack of info about diagnosis;Lack of info about treatment;Lack of info about complementary therapy choices  Physical Problem type Getting around;Breathing;Constipation/diarrhea;Tingling hands/feet  Physician notified of physical symptoms Yes  Referral to support programs Yes     Johnnye Lana, MSW, LCSW, OSW-C Clinical Social Worker [place] [phone]

## 2017-04-04 NOTE — Telephone Encounter (Signed)
Patient called with med instructions. Voiced understanding. Rx(s) sent to pharmacy electronically.

## 2017-04-04 NOTE — Patient Instructions (Signed)

## 2017-04-04 NOTE — Telephone Encounter (Signed)
Pt has breast cancer and was instructed by her doctor to notify Dr Debara Pickett that one of her cancer medicine(Tamoxifen)is going to inter with her Brilinta she is taking.They will also be sending some paperwork from the [facility] regarding her upcoming surgery.They will need his Cardiac Clearance.

## 2017-04-04 NOTE — Therapy (Signed)
[place], [place], [address] Phone: [phone]   Fax:  [phone]  Physical Therapy Evaluation  Patient Details  Name: Anna Henson MRN: [id number] Date of Birth: April 19, 1971 Referring Provider: Dr. [name]  Encounter Date: 04/04/2017      PT End of Session - 04/04/17 1408    Visit Number 1   Number of Visits 1   PT Start Time 0940   PT Stop Time 0955  Also saw pt from 1050-1108 for a total of 33 minutes   PT Time Calculation (min) 15 min   Activity Tolerance Patient tolerated treatment well   Behavior During Therapy [facility] for tasks assessed/performed      Past Medical History:  Diagnosis Date  . Anxiety   . CAD in native artery    a. 09/2014 Cath/PCI in setting of [place] - s/p 2.25 x 12 mm Promus Premier DES to the RCA;  b. 11/2016 Myoview: EF 56%, no ischemia;  c. 12/2016 NSTEMI/PCI: LM nl, LAd 25p, LCX 30p, RCA 100p (2.75x38 Promus Premier DES), mRCA 10 ISR, EF 50-55%.  . Chronic kidney disease    she sthinks has stage 2 CKD, has had US doppler and has some stenosis , this was done in [place]   . CKD (chronic kidney disease), stage II   . Depression   . Heart attack ([place])   . HOCM (hypertrophic obstructive cardiomyopathy) ([place])    a. 12/2016 Echo: EF 65-70%,  mod conc LVH, dynamic obstruction @ rest, peak velocity of 291 cm/sec w/ peak gradient of 23mHg, no rwma, Gr1 DD, triv TR, PASP 14mg.  . Marland Kitchenyperlipidemia   . Hypertension    > 5 years  . Palpitations   . Tobacco abuse 09/13/2014  [facility]    Past Surgical History:  Procedure Laterality Date  . CORONARY ANGIOPLASTY WITH STENT PLACEMENT     L main OK, LAD OK, CFX 30%, RCA 90%-0% w/ 2.25 x 12 mm Promus Premier DES, EF 55%  . CORONARY/GRAFT ACUTE MI REVASCULARIZATION N/A 12/31/2016   Procedure: Coronary/Graft Acute MI Revascularization;  Surgeon: MiSherren MochaMD;  [facility];  Service:  Cardiovascular;  Laterality: N/A;  . HERNIA REPAIR    . LEFT HEART CATH AND CORONARY ANGIOGRAPHY N/A 12/31/2016   Procedure: Left Heart Cath and Coronary Angiography;  Surgeon: MiSherren MochaMD;  [facility];  Service: Cardiovascular;  Laterality: N/A;  . LEFT HEART CATHETERIZATION WITH CORONARY ANGIOGRAM N/A 09/14/2014   Procedure: LEFT HEART CATHETERIZATION WITH CORONARY ANGIOGRAM;  Surgeon: ChBurnell BlanksMD;  [facility];  Service: Cardiovascular;  Laterality: N/A;  . PERCUTANEOUS CORONARY STENT INTERVENTION (PCI-S)  09/14/2014   Procedure: PERCUTANEOUS CORONARY STENT INTERVENTION (PCI-S);  Surgeon: ChBurnell BlanksMD;  [facility];  Service: Cardiovascular;;  . TONSILLECTOMY  2005   10 years ago    There were no vitals filed for this visit.       Subjective Assessment - 04/04/17 1338    Subjective Patient reports she is here today to be seen by her medical team for her newly diagnosed right breast cancer.   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 It measure 3.2 cm and is located in the lower outer quadrant. It is ER/PR positive and HER2 negative with a Ki67 of 2%. She was diagnosed  with transverse myelitis 1/17 and has residual deficits from that. She also had a heart attach 3/18 and is still recovering from that.   Patient Stated Goals Reduce lympehdema risk and learn post op shoulder ROM HEP   Currently in Pain? Yes   Pain Score 8    Pain Location --  Bilateral arms and legs   Pain Orientation Right;Left   Pain Descriptors / Indicators Shooting   Pain Type Neuropathic pain   Pain Onset More than a month ago   Pain Frequency Intermittent   Aggravating Factors  Being in any one position for a prolonged period of time   Pain Relieving Factors Changing positions            [facility] PT Assessment - 04/04/17 0001       Assessment   Medical Diagnosis Right breast cancer   Referring Provider Dr. Chevis Pretty   Onset Date/Surgical Date 03/08/17   Hand Dominance Right   Prior Therapy none     Precautions   Precautions Other (comment)   Precaution Comments active cancer; recent MI     Restrictions   Weight Bearing Restrictions No     Balance Screen   Has the patient fallen in the past 6 months No   Has the patient had a decrease in activity level because of a fear of falling?  No   Is the patient reluctant to leave their home because of a fear of falling?  No     Home Environment   Living Environment Private residence   Living Arrangements Alone   Available Help at Discharge Family     Prior Function   Level of Independence Independent   Vocation Full time employment   Press photographer for [employer]   Leisure She uses weights at times     Cognition   Overall Cognitive Status Within Functional Limits for tasks assessed     Posture/Postural Control   Posture/Postural Control Postural limitations   Postural Limitations Rounded Shoulders;Forward head     ROM / Strength   AROM / PROM / Strength AROM;Strength     AROM   AROM Assessment Site Shoulder;Cervical   Right/Left Shoulder Right;Left   Right Shoulder Extension 43 Degrees   Right Shoulder Flexion 142 Degrees   Right Shoulder ABduction 149 Degrees   Right Shoulder Internal Rotation 39 Degrees   Right Shoulder External Rotation 60 Degrees   Left Shoulder Extension 41 Degrees   Left Shoulder Flexion 142 Degrees   Left Shoulder ABduction 153 Degrees   Left Shoulder Internal Rotation 50 Degrees   Left Shoulder External Rotation 78 Degrees   Cervical Flexion WFL   Cervical Extension WFL   Cervical - Right Side Bend WFL   Cervical - Left Side Bend WFL   Cervical - Right Rotation WFL   Cervical - Left Rotation [facility]     Strength   Overall Strength Within functional limits for tasks performed            LYMPHEDEMA/ONCOLOGY QUESTIONNAIRE - 04/04/17 1404      Type   Cancer Type Right breast cancer     Lymphedema Assessments   Lymphedema Assessments Upper extremities     Right Upper Extremity Lymphedema   10 cm Proximal to Olecranon Process 28.9 cm   Olecranon Process 23.8 cm   10 cm Proximal to Ulnar Styloid Process 25.8 cm   Just Proximal to Ulnar Styloid Process 15.6 cm   Across Hand at [employer]  Space 19.6 cm   At [place] of 2nd Digit 6 cm     Left Upper Extremity Lymphedema   10 cm Proximal to Olecranon Process 28.3 cm   Olecranon Process 23.8 cm   10 cm Proximal to Ulnar Styloid Process 23.5 cm   Just Proximal to Ulnar Styloid Process 15.8 cm   Across Hand at [employer] 19.3 cm   At [place] of 2nd Digit 5.8 cm         Objective measurements completed on examination: See above findings.        Patient was instructed today in a home exercise program today for post op shoulder range of motion. These included active assist shoulder flexion in sitting, scapular retraction, wall walking with shoulder abduction, and hands behind head external rotation.  She was encouraged to do these twice a day, holding 3 seconds and repeating 5 times when permitted by her physician.              PT Education - 04/04/17 1408    Education provided Yes   Education Details Lymphedema risk reduction and post op shoulder ROM HEP   Person(s) Educated Patient;Parent(s)   Methods Explanation;Demonstration;Handout   Comprehension Returned demonstration;Verbalized understanding              Breast Clinic Goals - 04/04/17 1416      Patient will be able to verbalize understanding of pertinent lymphedema risk reduction practices relevant to her diagnosis specifically related to skin care.   Time 1   Period Days   Status Achieved     Patient will be able to return demonstrate and/or verbalize understanding of the post-op home exercise program related to regaining shoulder range  of motion.   Time 1   Period Days   Status Achieved     Patient will be able to verbalize understanding of the importance of attending the postoperative After Breast Cancer Class for further lymphedema risk reduction education and therapeutic exercise.   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 It measure 3.2 cm and is located in the lower outer quadrant. It is ER/PR positive and HER2 negative with a Ki67 of 2%. She was diagnosed with transverse myelitis 1/17 and has residual deficits from that. She also had a heart attack 3/18 and is still recovering from that. Her multidisciplinary medical team met prior to her assessments to determine a recommended treatment plan. She is planning to have genetic testing, neoadjuvant anti-estrogen therapy, either a right mastectomy or lumpectomy with a sentinel node biopsy, Oncotype testing, radiation, and anti-estrogen therapy. She will benefit from post op PT to regain shoulder ROM as it is already painful from myelitis.   History and Personal Factors relevant to plan of care: Recent MI, transverse myelitis   Clinical Presentation Evolving   Clinical Presentation due to: Condition is evolving as she continues to recover from her MI and transverse myelitis   Clinical Decision Making Moderate   Rehab Potential Good   Clinical Impairments Affecting Rehab Potential Transverse myelitis   PT Frequency One time visit   PT Treatment/Interventions Patient/family education;Therapeutic exercise   PT Next Visit Plan Will f/u after surgery to determine PT needs   PT Home Exercise Plan Post op shoulder ROM HEP  Consulted and Agree with Plan of Care Patient;Family member/caregiver   Family Member Consulted Mother      Patient will benefit from skilled therapeutic intervention in order to improve the  following deficits and impairments:  Decreased range of motion, Impaired UE functional use, Pain, Decreased knowledge of precautions, Postural dysfunction  Visit Diagnosis: Carcinoma of lower-outer quadrant of right breast in female, estrogen receptor positive ([place]) - Plan: PT plan of care cert/re-cert  Abnormal posture - Plan: PT plan of care cert/re-cert  Pain in left arm - Plan: PT plan of care cert/re-cert  Pain in right arm - Plan: PT plan of care cert/re-cert   Patient will follow up at outpatient cancer rehab if needed following surgery.  If the patient requires physical therapy at that time, a specific plan will be dictated and sent to the referring physician for approval. The patient was educated today on appropriate basic range of motion exercises to begin post operatively and the importance of attending the After Breast Cancer class following surgery.  Patient was educated today on lymphedema risk reduction practices as it pertains to recommendations that will benefit the patient immediately following surgery.  She verbalized good understanding.  No additional physical therapy is indicated at this time.      Problem List Patient Active Problem List   Diagnosis Date Noted  . Malignant neoplasm of lower-outer quadrant of right breast of female, estrogen receptor positive ([place]) 03/29/2017  . Dysesthesia 03/12/2017  . Palpitations 02/23/2017  . S/P cardiac cath (12/2016) a. acute total occlusion of the RCA tx w/PCI DES, b. mild nonobs LAD/LCx stenosis, c. mild segmental LV sys dx w/ sev hypokinesis, LVEF 50-55% 01/02/2017  . Non-ST elevation myocardial infarction (NSTEMI), subsequent episode of care ([place]) 12/31/2016  . Malignant hypertension   . Chronic kidney disease   . ST elevation myocardial infarction involving right coronary artery ([place])   . Abnormal EKG   . Nausea   . Ischemic cardiomyopathy   . Muscle spasms of both lower extremities 04/17/2016  . Gait disturbance  01/25/2016  . Transverse myelitis ([place]) 12/14/2015  . Acute-on-chronic kidney injury ([place]) 11/12/2015  . Neck pain 11/12/2015  . Hypokalemia 11/12/2015  . Abnormal MRI, neck 11/12/2015  . Numbness 11/12/2015  . Hypertension   . Hyperlipidemia   . CAD in native artery   . Facial numbness   . Right arm numbness   . Throat pain 11/09/2015  . Drooling 11/09/2015  . CAD (coronary artery disease), native coronary artery 09/15/2014  . Headache 09/15/2014  . UTI (urinary tract infection): Probable 09/14/2014  . Candida infection of genital region 09/14/2014  . Unstable angina ([place]) 09/13/2014  . HLD (hyperlipidemia) 09/13/2014  . Otitis 09/13/2014  . ACS (acute coronary syndrome) ([place]) 09/13/2014  . Tobacco abuse 09/13/2014  . Essential hypertension 09/09/2014   Annia Friendly, PT 04/04/17 2:19 PM  [place], [place], [address] Phone: [phone]   Fax:  [phone]  Name: Anna Henson MRN: [id number] Date of Birth: November 17, 1970

## 2017-04-04 NOTE — Progress Notes (Signed)
Nutrition Assessment  Reason for Assessment:  Pt seen in Breast Clinic  ASSESSMENT:   46 year old female with new diagnosis of breast cancer.  Past medical history of 2 stent placed, MI, transverse myelitis.    Patient reports normal appetite.   Medications:  reviewed  Labs: reviewed  Anthropometrics:   Height: 61.5 inches Weight: 137 lb BMI: 25.7   NUTRITION DIAGNOSIS: Food and nutrition related knowledge deficit related to new diagnosis of breast cancer as evidenced by no prior need for nutrition related information.  INTERVENTION:   Discussed and provided packet of information regarding nutritional tips for breast cancer patients.  Questions answered.  Teachback method used.  Contact information provided and patient knows to contact me with questions/concerns.    MONITORING, EVALUATION, and GOAL: Pt will consume a healthy plant based diet to maintain lean body mass throughout treatment.   Treyon Wymore B. Zenia Resides, [place], [place] Registered Dietitian [phone] (pager)

## 2017-04-04 NOTE — Progress Notes (Signed)
Radiation Oncology         [phone] ________________________________  Name: Anna Henson MRN: [id number]  Date: 04/04/2017  DOB: 08/22/1971  OI:[id number], Renette Butters, MD  Jovita Kussmaul, MD     REFERRING PHYSICIAN: Jovita Kussmaul, MD   DIAGNOSIS: 46 year-old woman with invasive ductal carcinoma of the right breast, ER/PR+, Her-2 negative   The encounter diagnosis was Malignant neoplasm of lower-outer quadrant of right breast of female, estrogen receptor positive ([place]).   HISTORY OF PRESENT ILLNESS: Anna Henson is a 46 y.o. female who presented for diagnostic mammogram and ultrasound on 03/22/2017 after her doctor felt palpable area in the right breast lower outer quadrant. These scans showed a 1.3 cm oval mass with a lobulated margin in the right breast at 8 o'clock middle depth. No abnormalities were seen sonographically in the right axilla.  Biopsy of the right breast mass at 8:00 o'clock position 7 cm from the nipple performed on 03/26/2017 revealed invasive mammary carcinoma, ER 85% / PR 100% / HER-2 negative / Ki-67 2%. Immunohistory for E-Cadherin performed and the tumor is negative consistent for lobular carcinoma.  The patient will undergo breast MRI and genetics.  The patient presents today to our multidisciplinary clinic to discuss treatment options in the management of her disease.   PREVIOUS RADIATION THERAPY: No   PAST MEDICAL HISTORY:  Past Medical History:  Diagnosis Date  . Anxiety   . CAD in native artery    a. 09/2014 Cath/PCI in setting of [place] - s/p 2.25 x 12 mm Promus Premier DES to the RCA;  b. 11/2016 Myoview: EF 56%, no ischemia;  c. 12/2016 NSTEMI/PCI: LM nl, LAd 25p, LCX 30p, RCA 100p (2.75x38 Promus Premier DES), mRCA 10 ISR, EF 50-55%.  . Chronic kidney disease    she sthinks has stage 2 CKD, has had US doppler and has some stenosis , this was done in [place]   . CKD (chronic kidney disease), stage II   . Depression   . Heart attack ([place])   . HOCM  (hypertrophic obstructive cardiomyopathy) ([place])    a. 12/2016 Echo: EF 65-70%,  mod conc LVH, dynamic obstruction @ rest, peak velocity of 291 cm/sec w/ peak gradient of 68mHg, no rwma, Gr1 DD, triv TR, PASP 169mg.  . Marland Kitchenyperlipidemia   . Hypertension    > 5 years  . Palpitations   . Tobacco abuse 09/13/2014  . [place]       PAST SURGICAL HISTORY: Past Surgical History:  Procedure Laterality Date  . CORONARY ANGIOPLASTY WITH STENT PLACEMENT     L main OK, LAD OK, CFX 30%, RCA 90%-0% w/ 2.25 x 12 mm Promus Premier DES, EF 55%  . CORONARY/GRAFT ACUTE MI REVASCULARIZATION N/A 12/31/2016   Procedure: Coronary/Graft Acute MI Revascularization;  Surgeon: MiSherren MochaMD;  [facility];  Service: Cardiovascular;  Laterality: N/A;  . HERNIA REPAIR    . LEFT HEART CATH AND CORONARY ANGIOGRAPHY N/A 12/31/2016   Procedure: Left Heart Cath and Coronary Angiography;  Surgeon: MiSherren MochaMD;  [facility];  Service: Cardiovascular;  Laterality: N/A;  . LEFT HEART CATHETERIZATION WITH CORONARY ANGIOGRAM N/A 09/14/2014   Procedure: LEFT HEART CATHETERIZATION WITH CORONARY ANGIOGRAM;  Surgeon: ChBurnell BlanksMD;  [facility];  Service: Cardiovascular;  Laterality: N/A;  . PERCUTANEOUS CORONARY STENT INTERVENTION (PCI-S)  09/14/2014   Procedure: PERCUTANEOUS CORONARY STENT INTERVENTION (PCI-S);  Surgeon: ChBurnell BlanksMD;  Location: [facility];  Service: Cardiovascular;;  . TONSILLECTOMY  2005   10 years ago     FAMILY HISTORY:  Family History  Problem Relation Age of Onset  . Diabetes Mother   . Heart disease Mother   . Hyperlipidemia Mother   . Hypertension Mother   . Lung cancer Father   . Drug abuse Father   . Drug abuse Sister   . Mental illness Brother   . Mental illness Maternal Grandmother   . Stomach cancer Paternal Grandmother      SOCIAL HISTORY:  reports that she quit smoking about 17 months  ago. Her smoking use included Cigarettes. She smoked 0.50 packs per day. She has never used smokeless tobacco. She reports that she drinks alcohol. She reports that she does not use drugs.   ALLERGIES: Patient has no known allergies.   MEDICATIONS:  Current Outpatient Prescriptions  Medication Sig Dispense Refill  . ARIPiprazole (ABILIFY) 5 MG tablet Take 1 tablet (5 mg total) by mouth daily. 90 tablet 0  . aspirin 81 MG chewable tablet Chew 1 tablet (81 mg total) by mouth daily.    Marland Kitchen atorvastatin (LIPITOR) 80 MG tablet TAKE 1 TABLET BY MOUTH EVERY DAY 30 tablet 0  . baclofen (LIORESAL) 10 MG tablet Take up to 4/day for muscle spasms 120 each 11  . fluticasone (FLONASE) 50 MCG/ACT nasal spray SHAKE LIQUID AND USE 2 SPRAYS IN EACH NOSTRIL DAILY 16 g 5  . gabapentin (NEURONTIN) 300 MG capsule Take one pill in the morning, two in the afternoon and two po at bedtime 150 capsule 11  . lisinopril (PRINIVIL,ZESTRIL) 2.5 MG tablet Take 1 tablet (2.5 mg total) by mouth daily. 90 tablet 3  . metoprolol (LOPRESSOR) 50 MG tablet Take 0.5 tablets (25 mg total) by mouth 2 (two) times daily. TAKE 1 TABLET BY MOUTH TWICE DAILY. 90 tablet 1  . nitroGLYCERIN (NITROSTAT) 0.4 MG SL tablet Place 1 tablet (0.4 mg total) under the tongue every 5 (five) minutes as needed for chest pain. 25 tablet 1  . OVER THE COUNTER MEDICATION 715 mg.    . OVER THE COUNTER MEDICATION Take 1 capsule by mouth daily.    . pantoprazole (PROTONIX) 40 MG tablet Take 1 tablet (40 mg total) by mouth 2 (two) times daily. 30 tablet 6  . tamoxifen (NOLVADEX) 20 MG tablet Take 1 tablet (20 mg total) by mouth daily. 90 tablet 3  . ticagrelor (BRILINTA) 90 MG TABS tablet Take 1 tablet (90 mg total) by mouth 2 (two) times daily. 180 tablet 3  . traMADol (ULTRAM) 50 MG tablet Take 1 tablet (50 mg total) by mouth 4 (four) times daily as needed. 120 tablet 4  . triamterene-hydrochlorothiazide (DYAZIDE) 37.5-25 MG capsule Take 1 each (1 capsule  total) by mouth daily. 90 capsule 3   No current facility-administered medications for this encounter.    Facility-Administered Medications Ordered in Other Encounters  Medication Dose Route Frequency Provider Last Rate Last Dose  . gadopentetate dimeglumine (MAGNEVIST) injection 16 mL  16 mL Intravenous Once PRN Sater, Nanine Means, MD       Gynecologic History  Age at first menstrual period? 16  Are you still having periods? Yes Approximate date of last period? N/A  If you are still having periods: Are your periods regular? Yes  If you no longer have periods: Have you used hormone replacement? N/A  If YES, for how long? N/A When did you stop? N/A Obstetric History:  How  many children have you carried to term? 0 Your age at first live birth? N/A  Pregnant now or trying to get pregnant? No  Have you used birth control pills or hormone shots for contraception? No  If so, for how long (or approximate dates)? N/A  Would you be interested in learning more about the options to preserve fertility? No Health Maintenance:  Have you ever had a colonoscopy? No If yes, date? N/A  Have you ever had a bone density? No If yes, date? N/A  Date of your last PAP smear? 03/17/2017 Date of your FIRST mammogram? 01/2014   REVIEW OF SYSTEMS: On review of systems, the patient reports that she is doing well overall. She reports fatigue which affects her activities. She reports pain in her legs and arms described as stabbing, throbbing, muscle ache, and cramping. She reports ear infections and sinus problems. She reports irregular heartbeat, palpitations, chest pain, poor circulation, and feet swelling. She reports shortness of breath at rest, walking, and while climbing stairs. She reports heart burn, change in stool habits, and abdominal pain. She reports a breast lump. She reports skin rash and easily bruised. She reports back pain and difficulties walking. She reports headaches, weakness, and numbness, She reports  anxiety and depression. A complete review of systems is obtained and is otherwise negative.     PHYSICAL EXAM:  Wt Readings from Last 3 Encounters:  04/04/17 137 lb 14.4 oz (62.6 kg)  03/12/17 138 lb (62.6 kg)  02/23/17 142 lb 3.2 oz (64.5 kg)   Temp Readings from Last 3 Encounters:  04/04/17 98.5 F (36.9 C) (Oral)  02/08/17 98.1 F (36.7 C) (Oral)  01/19/17 98.4 F (36.9 C) (Oral)   BP Readings from Last 3 Encounters:  04/04/17 133/84  03/15/17 102/60  03/12/17 131/88   Pulse Readings from Last 3 Encounters:  04/04/17 65  03/15/17 61  03/12/17 68    /10  In general this is a well appearing African-American female in no acute distress. She is alert and oriented x4 and appropriate throughout the examination. HEENT reveals that the patient is normocephalic, atraumatic. EOMs are intact. PERRLA. Skin is intact without any evidence of gross lesions. Cardiovascular exam reveals a regular rate and rhythm, no clicks rubs or murmurs are auscultated. Chest is clear to auscultation bilaterally. Lymphatic assessment is performed and does not reveal any adenopathy in the cervical, supraclavicular, axillary, or inguinal chains. Abdomen has active bowel sounds in all quadrants and is intact. The abdomen is soft, non tender, non distended. Lower extremities are negative for pretibial pitting edema, deep calf tenderness, cyanosis or clubbing. Breast exam shows easily palpable 3 cm mass in the upper aspect of the right breast.   ECOG = 0  0 - Asymptomatic (Fully active, able to carry on all predisease activities without restriction)  1 - Symptomatic but completely ambulatory (Restricted in physically strenuous activity but ambulatory and able to carry out work of a light or sedentary nature. For example, light housework, office work)  2 - Symptomatic, <50% in bed during the day (Ambulatory and capable of all self care but unable to carry out any work activities. Up and about more than 50% of  waking hours)  3 - Symptomatic, >50% in bed, but not bedbound (Capable of only limited self-care, confined to bed or chair 50% or more of waking hours)  4 - Bedbound (Completely disabled. Cannot carry on any self-care. Totally confined to bed or chair)  5 - Death  Oken MM, Creech RH, Tormey DC, et al. [phone]). "Toxicity and response criteria of the [facility]". [place] Oncol. 5 (6): 649-55    LABORATORY DATA:  Lab Results  Component Value Date   WBC 9.0 04/04/2017   HGB 11.6 04/04/2017   HCT 33.5 (L) 04/04/2017   MCV 90.8 04/04/2017   PLT 352 04/04/2017   Lab Results  Component Value Date   NA 139 04/04/2017   K 3.6 04/04/2017   CL 99 01/22/2017   CO2 25 04/04/2017   Lab Results  Component Value Date   ALT 22 04/04/2017   AST 23 04/04/2017   ALKPHOS 69 04/04/2017   BILITOT <0.22 04/04/2017      RADIOGRAPHY: No results found.     IMPRESSION:   The patient has a recent diagnosis of invasive ductal carcinoma of the right breast. She appears to be a good candidate for breast conservation treatment.  I discussed with the patient the role of adjuvant radiation treatment in this setting. We discussed the potential benefit of radiation treatment, especially with regards to local control of the patient's tumor. We also discussed the possible side effects and risks of such a treatment as well.  All of the patient's questions were answered. The patient wishes to proceed with radiation treatment at the appropriate time.  We discussed a likely 6-1/2 week course of radiation treatment.  PLAN: I look forward to seeing the patient postoperatively to review her case and further discuss and coordinate an anticipated course of radiation treatment.   ------------------------------------------------  Jodelle Gross, MD, PhD  This document serves as a record of services personally performed by Kyung Rudd, MD. It was created on his behalf by Arlyce Harman, a trained medical scribe. The creation of this record is based on the scribe's personal observations and the provider's statements to them. This document has been checked and approved by the attending provider.

## 2017-04-05 ENCOUNTER — Ambulatory Visit (HOSPITAL_BASED_OUTPATIENT_CLINIC_OR_DEPARTMENT_OTHER): Payer: Federal, State, Local not specified - PPO | Admitting: Genetics

## 2017-04-05 ENCOUNTER — Encounter: Payer: Self-pay | Admitting: Genetics

## 2017-04-05 ENCOUNTER — Other Ambulatory Visit: Payer: Federal, State, Local not specified - PPO

## 2017-04-05 DIAGNOSIS — Z800 Family history of malignant neoplasm of digestive organs: Secondary | ICD-10-CM

## 2017-04-05 DIAGNOSIS — C50911 Malignant neoplasm of unspecified site of right female breast: Secondary | ICD-10-CM

## 2017-04-05 DIAGNOSIS — Z170 Estrogen receptor positive status [ER+]: Principal | ICD-10-CM

## 2017-04-05 DIAGNOSIS — Z802 Family history of malignant neoplasm of other respiratory and intrathoracic organs: Secondary | ICD-10-CM

## 2017-04-05 DIAGNOSIS — Z808 Family history of malignant neoplasm of other organs or systems: Secondary | ICD-10-CM

## 2017-04-05 NOTE — Progress Notes (Signed)
REFERRING PROVIDER: Wardell Honour, MD [address] [place], [place] [address]  PRIMARY PROVIDER:  Wardell Honour, MD  PRIMARY REASON FOR VISIT:  1. Malignant neoplasm of right breast in female, estrogen receptor positive, unspecified site of breast ([place])      HISTORY OF PRESENT ILLNESS:   Anna Henson, a 46 y.o. female, was seen for a [place] cancer genetics consultation at the request of Dr. Tamala Julian due to a personal and family history of cancer.  Anna Henson presents to clinic today to discuss the possibility of a hereditary predisposition to cancer, genetic testing, and to further clarify her future cancer risks, as well as potential cancer risks for family members.   In June 2018, at the age of 14, Anna Henson was diagnosed with ER/PR+ HER2- invasive lobular carcinoma of the right breast. Her treatment is pending.   CANCER HISTORY:    Malignant neoplasm of lower-outer quadrant of right breast of female, estrogen receptor positive ([place])   03/26/2017 Initial Diagnosis    Palpable right breast mass with a normal mammogram: 8:00 position by ultrasound 3.2 cm irregular mass: Grade 2 invasive lobular cancer ER 85%, PR 100%, Ki-67 2%, HER-2 negative ratio 1.5, T2 N0 stage II a clinical stage        Past Medical History:  Diagnosis Date  . Anxiety   . CAD in native artery    a. 09/2014 Cath/PCI in setting of [place] - s/p 2.25 x 12 mm Promus Premier DES to the RCA;  b. 11/2016 Myoview: EF 56%, no ischemia;  c. 12/2016 NSTEMI/PCI: LM nl, LAd 25p, LCX 30p, RCA 100p (2.75x38 Promus Premier DES), mRCA 10 ISR, EF 50-55%.  . Chronic kidney disease    she sthinks has stage 2 CKD, has had US doppler and has some stenosis , this was done in [place]   . CKD (chronic kidney disease), stage II   . Depression   . Heart attack ([place])   . HOCM (hypertrophic obstructive cardiomyopathy) ([place])    a. 12/2016 Echo: EF 65-70%,  mod conc LVH, dynamic obstruction @ rest, peak velocity of 291 cm/sec w/ peak  gradient of 85mHg, no rwma, Gr1 DD, triv TR, PASP 159mg.  . Marland Kitchenyperlipidemia   . Hypertension    > 5 years  . Palpitations   . Tobacco abuse 09/13/2014  . [facility]    Past Surgical History:  Procedure Laterality Date  . CORONARY ANGIOPLASTY WITH STENT PLACEMENT     L main OK, LAD OK, CFX 30%, RCA 90%-0% w/ 2.25 x 12 mm Promus Premier DES, EF 55%  . CORONARY/GRAFT ACUTE MI REVASCULARIZATION N/A 12/31/2016   Procedure: Coronary/Graft Acute MI Revascularization;  Surgeon: MiSherren MochaMD;  [facility];  Service: Cardiovascular;  Laterality: N/A;  . HERNIA REPAIR    . LEFT HEART CATH AND CORONARY ANGIOGRAPHY N/A 12/31/2016   Procedure: Left Heart Cath and Coronary Angiography;  Surgeon: MiSherren MochaMD;  [facility];  Service: Cardiovascular;  Laterality: N/A;  . LEFT HEART CATHETERIZATION WITH CORONARY ANGIOGRAM N/A 09/14/2014   Procedure: LEFT HEART CATHETERIZATION WITH CORONARY ANGIOGRAM;  Surgeon: ChBurnell BlanksMD;  [facility];  Service: Cardiovascular;  Laterality: N/A;  . PERCUTANEOUS CORONARY STENT INTERVENTION (PCI-S)  09/14/2014   Procedure: PERCUTANEOUS CORONARY STENT INTERVENTION (PCI-S);  Surgeon: ChBurnell BlanksMD;  [facility];  Service: Cardiovascular;;  . TONSILLECTOMY  2005   10 years ago  Social History   Social History  . Marital status: Single    Spouse name: N/A  . Number of children: 0  . Years of education: masters   Occupational History  .      [employer]   Social History Main Topics  . Smoking status: Former Smoker    Packs/day: 0.50    Types: Cigarettes    Quit date: 10/05/2015  . Smokeless tobacco: Never Used     Comment: on patch since 09/09/14  . Alcohol use 0.0 oz/week     Comment: occas  . Drug use: No  . Sexual activity: No   Other Topics Concern  . Not on file   Social History Narrative   Consumes 2 cups of caffeine daily     FAMILY  HISTORY:  We obtained a detailed, 4-generation family history.  Significant diagnoses are listed below: Family History  Problem Relation Age of Onset  . Diabetes Mother   . Heart disease Mother   . Hyperlipidemia Mother   . Hypertension Mother   . Lung cancer Father   . Drug abuse Father   . Brain cancer Father 31  . Bone cancer Father 15  . Drug abuse Sister   . Mental illness Brother   . Mental illness Maternal Grandmother   . Stomach cancer Paternal Grandmother 13       d.75   Anna Henson has no children. She has one full-brother, age 51, and one 28, age 28, who are both without cancers. She also has a paternal half-brother, age 52, who does not have cancer.  Anna Henson mother is 28 without cancers. Her mother has a full-sister who died without cancer at 33 and a maternal half-sister who died at 78 without cancer. Anna Henson maternal grandfather died after age 45 and her maternal grandmother died at 53. Neither had cancer.  Anna Henson father is 64 and was diagnosed with brain and bone cancer at age 58. Anna Henson's father has a maternal half-brother who died at age 12 from [facility] Spotted Fever. This uncle to Anna Henson had two daughters. Both daughters are living and one has an unspecified form of cancer. Anna Henson paternal grandmother was diagnosed with stomach cancer around age 38 and died at 53. Anna Henson knows that her biological paternal grandfather is deceased, but has no further information about him.  Anna Henson is unaware of previous family history of genetic testing for hereditary cancer risks. Patient's maternal and paternal ancestors are of African American descent. There is no reported Ashkenazi Jewish ancestry. There is no known consanguinity.  GENETIC COUNSELING ASSESSMENT: Anna Henson is a 46 y.o. female with a personal and family history which is somewhat suggestive of a hereditary cancer synrome and predisposition to cancer. We, therefore,  discussed and recommended the following at today's visit.   DISCUSSION: We reviewed the characteristics, features and inheritance patterns of hereditary cancer syndromes. We also discussed genetic testing, including the appropriate family members to test, the process of testing, insurance coverage and turn-around-time for results. We discussed the implications of a negative, positive and/or variant of uncertain significant result. In order to get genetic test results in a timely manner so that Anna Henson can use these genetic test results for surgical decisions, we recommended Anna Henson pursue genetic testing for [facility]'s 9-gene High/Moderate Breast Cancer Risk STAT Panel. If this test is negative, we then recommend Anna Henson pursue reflex genetic testing to the 46-gene Common Hereditary Cancers Panel.  Based on Anna Henson's personal and family history of cancer, she meets medical criteria for genetic testing. Despite that she meets criteria, she may still have an out of pocket cost. We discussed that if her out of pocket cost for testing is over $100, the laboratory will call and confirm whether she wants to proceed with testing.  If the out of pocket cost of testing is less than $100 she will be billed by the genetic testing laboratory.   PLAN: After considering the risks, benefits, and limitations, Anna Henson  provided informed consent to pursue genetic testing and the blood sample was sent to [facility] for analysis of the 9-gene High/Moderate Breast Risk STAT Panel with reflex to the 46-gene Common Hereditary Cancers Panel. Results should be available within approximately 2 weeks' time, at which point they will be disclosed by telephone to Ms. Beharry, as will any additional recommendations warranted by these results. This information will also be available in Epic.   Lastly, we encouraged Ms. Eggebrecht to remain in contact with cancer genetics annually so that we can continuously update the  family history and inform her of any changes in cancer genetics and testing that may be of benefit for this family.   Ms.  Basilio questions were answered to her satisfaction today. Our contact information was provided should additional questions or concerns arise. Thank you for the referral and allowing [place] to share in the care of your patient.   Mal Misty, MS, [facility] Certified Naval [email or web address] phone: [phone]  The patient was seen for a total of 25 minutes in face-to-face genetic counseling.  This patient was discussed with Drs. Magrinat, Lindi Adie and/or Burr Medico who agrees with the above.    _______________________________________________________________________ For Office Staff:  Number of people involved in session: 1 Was an Intern/ student involved with case: no

## 2017-04-06 ENCOUNTER — Telehealth: Payer: Self-pay | Admitting: Internal Medicine

## 2017-04-06 NOTE — Telephone Encounter (Addendum)
[facility] requests clearance for: 1. Type of surgery: breast lumpectomy under general anesthesia 2. Date of surgery: tentative for Sept/Oct 2018 3. Surgeon: Dr. [name] 4. Medications that need to be held & how long: Effient - NO need to stop aspirin 5. Fax and/or Phone: (p) [phone]  (f) [phone]  -- attnL Carlene Coria, CMA

## 2017-04-07 NOTE — Telephone Encounter (Signed)
Looks like lumpectomy anticipated in Sept/Oct - she had a stent in 12/2016 - ideal to be on dual-antiplatets for 1 year, but given the nature of her surgery, I would advise minimum of 6 months on ASA and Effient. At that time, she would stay on Aspirin and stop Effient for 3 days prior to surgery and restart after.  Dr. Debara Pickett

## 2017-04-09 NOTE — Telephone Encounter (Signed)
Clearance routed via EPIC 

## 2017-04-10 ENCOUNTER — Telehealth: Payer: Self-pay | Admitting: *Deleted

## 2017-04-10 NOTE — Telephone Encounter (Signed)
  Oncology Nurse Navigator Documentation  Navigator Location: [facility] (04/10/17 1000)   )Navigator Encounter Type: Telephone (04/10/17 1000) Telephone: Outgoing Call;Clinic/MDC Follow-up (04/10/17 1000)                                                  Time Spent with Patient: 15 (04/10/17 1000)

## 2017-04-12 ENCOUNTER — Ambulatory Visit: Payer: Federal, State, Local not specified - PPO

## 2017-04-17 ENCOUNTER — Ambulatory Visit
Admission: RE | Admit: 2017-04-17 | Discharge: 2017-04-17 | Disposition: A | Discharge status: Home / Self Care | Payer: Federal, State, Local not specified - PPO | Source: Ambulatory Visit | Attending: General Surgery | Admitting: General Surgery

## 2017-04-17 DIAGNOSIS — C50511 Malignant neoplasm of lower-outer quadrant of right female breast: Secondary | ICD-10-CM

## 2017-04-17 DIAGNOSIS — Z170 Estrogen receptor positive status [ER+]: Principal | ICD-10-CM

## 2017-04-17 MED ORDER — GADOBENATE DIMEGLUMINE 529 MG/ML IV SOLN
12.0000 mL | Freq: Once | INTRAVENOUS | Status: AC | PRN
  Administered 2017-04-17: 12 mL via INTRAVENOUS

## 2017-04-18 ENCOUNTER — Telehealth: Payer: Self-pay | Admitting: Genetics

## 2017-04-18 NOTE — Telephone Encounter (Signed)
Reviewed that the 9-gene high/moderate breast risk STAT panel performed by [facility] was negative for mutations. Will reflex to remaining genes included on [facility]'s 46-gene Common Hereditary Cancer panel. Will call patient when remaining results are available. Final risk assessment and documentation will be made at that time. A portion of her STAT panel results are included below for reference.  

## 2017-04-20 ENCOUNTER — Encounter: Payer: Self-pay | Admitting: General Practice

## 2017-04-20 NOTE — Progress Notes (Signed)
[place] Spiritual Care Note  Reached Sarit by phone per referral from Nurse Navigator [facility].  Innocence used the opportunity well to share and process her fear, distress, and anxiety related to breast cancer dx and uncertainty--on top of other issues.  [place] is working hard to take one day at a time.  She reports support from her mom and dad, but had a very strong reaction from another friend, making her hesitant to share with others; we talked about how [place], and other [place] programming could help build constructive relationships without the burden of becoming someone else's emotional caregiver.  We plan for me to f/u by phone at the end of next week, but pt also has my number and knows to call anytime with questions or for support.  She verbalized gratitude and welcomes f/u.   [place], [place], [facility] Pager [phone] Voicemail [phone]

## 2017-04-23 ENCOUNTER — Other Ambulatory Visit: Payer: Self-pay | Admitting: Radiology

## 2017-04-23 ENCOUNTER — Encounter: Payer: Self-pay | Admitting: Genetics

## 2017-04-23 ENCOUNTER — Telehealth: Payer: Self-pay | Admitting: Genetics

## 2017-04-23 ENCOUNTER — Ambulatory Visit: Payer: Self-pay | Admitting: Genetics

## 2017-04-23 DIAGNOSIS — Z1379 Encounter for other screening for genetic and chromosomal anomalies: Secondary | ICD-10-CM

## 2017-04-23 HISTORY — DX: Encounter for other screening for genetic and chromosomal anomalies: Z13.79

## 2017-04-23 NOTE — Progress Notes (Signed)
HPI: [name] was previously seen in the [facility] on 04/05/2017 due to a personal and family history of cancer and concerns regarding a hereditary predisposition to cancer. Please refer to our prior cancer genetics clinic note for more information regarding [name]'s medical, social and family histories, and our assessment and recommendations, at the time. [name] recent genetic test results were disclosed to her, as were recommendations warranted by these results. These results and recommendations are discussed in more detail below.  CANCER HISTORY: In June 2018, at the age of 46, [name] was diagnosed with ER/PR+ HER2- invasive lobular carcinoma of the right breast. Her treatment is pending.     Malignant neoplasm of lower-outer quadrant of right breast of female, estrogen receptor positive ([place])   03/26/2017 Initial Diagnosis    Palpable right breast mass with a normal mammogram: 8:00 position by ultrasound 3.2 cm irregular mass: Grade 2 invasive lobular cancer ER 85%, PR 100%, Ki-67 2%, HER-2 negative ratio 1.5, T2 N0 stage II a clinical stage        FAMILY HISTORY:  We obtained a detailed, 4-generation family history.  Significant diagnoses are listed below: Family History  Problem Relation Age of Onset  . Diabetes Mother   . Heart disease Mother   . Hyperlipidemia Mother   . Hypertension Mother   . Lung cancer Father   . Drug abuse Father   . Brain cancer Father 36  . Bone cancer Father 20  . Drug abuse Sister   . Mental illness Brother   . Mental illness Maternal Grandmother   . Stomach cancer Paternal Grandmother 1       d.75   [name] has no children. She has one full-brother, age 21, and one 58, age 75, who are both without cancers. She also has a paternal half-brother, age 37, who does not have cancer.  [name] mother is 57 without cancers. Her mother has a full-sister who died without cancer at 20 and a maternal  half-sister who died at 39 without cancer. [name] maternal grandfather died after age 56 and her maternal grandmother died at 75. Neither had cancer.  [name] father is 64 and was diagnosed with brain and bone cancer at age 34. [name]'s father has a maternal half-brother who died at age 37 from [facility] Spotted Fever. This uncle to [name] had two daughters. Both daughters are living and one has an unspecified form of cancer. [name] paternal grandmother was diagnosed with stomach cancer around age 60 and died at 4. [name] knows that her biological paternal grandfather is deceased, but has no further information about him.  [name] is unaware of previous family history of genetic testing for hereditary cancer risks. Patient's maternal and paternal ancestors are of African American descent. There is no reported Ashkenazi Jewish ancestry. There is no known consanguinity.  GENETIC TEST RESULTS: Genetic testing performed through [facility]'s Common Hereditary Cancer Panel reported out on 04/20/2017 showed no pathogenic mutations. [facility] Common Hereditary Cancers Panel includes analysis of the following 46 genes: APC, ATM, AXIN2, BARD1, BMPR1A, BRCA1, BRCA2, BRIP1, CDH1, CDKN2A, CHEK2, CTNNA1, DICER1, EPCAM, GREM1, HOXB13, KIT, MEN1, MLH1, MSH2, MSH3, MSH6, MUTYH, NBN, NF1, NTHL1, PALB2, PDGFRA, PMS2, POLD1, POLE, PTEN, RAD50, RAD51C, RAD51D, SDHA, SDHB, SDHC, SDHD, SMAD4, SMARCA4, STK11, TP53, TSC1, TSC2, and VHL.  The following variants of uncertain significance (VUS) were also noted: APC c.7032A>C (p.Gln2344His).  BRIP1 variant is c.1208G>A (p.Arg403Gln)  At  this time, it is unknown if either of these variants is associated with increased cancer risk or if this is a normal finding, but most variants such as this get reclassified to being inconsequential. VUSs should not be used to make medical management decisions. With time, we suspect the lab will determine the  significance of these variants, if any. If we do learn more about either variant, we will try to contact [name] to discuss it further. However, it is important to stay in touch with [place] periodically and keep the address and phone number up to date.  The test report will be scanned into EPIC and will be located under the Molecular Pathology section of the Results Review tab.A portion of the result report is included below for reference.      We discussed with [name] that since the current genetic testing is not perfect, it is possible there may be a gene mutation in one of these genes that current testing cannot detect, but that chance is small. We also discussed, that it is possible that another gene that has not yet been discovered, or that we have not yet tested, is responsible for the cancer diagnoses in the family. Therefore, important to remain in touch with cancer genetics in the future so that we can continue to offer [name] the most up to date genetic testing.   CANCER SCREENING RECOMMENDATIONS: This result indicates that it is unlikely [name] has an increased risk for a future cancer due to a mutation in one of these genes. Her personal and family history of cancer remain unexplained. Because no causative or actionable mutations were identified, it is recommended she continue to follow the cancer management and screening guidelines provided by her oncology and primary healthcare provider.   RECOMMENDATIONS FOR FAMILY MEMBERS: Women in this family might be at some increased risk of developing breast cancer, over the general population risk, simply due to the family history of cancer. We recommended women in this family have a yearly mammogram beginning at age 19, or 19 years younger than the earliest onset of cancer, an annual clinical breast exam, and perform monthly breast self-exams. Women in this family should also have a gynecological exam as recommended by their primary  provider. All family members should have a colonoscopy by age 84.  [name]'s father has had bone and brain cancer. Her paternal grandmother has died with stomach cancer. Currently, there are no established screening guidelines for most individuals with a family history of stomach, brain, and bone cancers. If [name] and her family members would like to consider screening for these cancers, they should consult their physicians regarding options.   FOLLOW-UP: Lastly, we discussed with [name] that cancer genetics is a rapidly advancing field and it is possible that new genetic tests will be appropriate for her and/or her family members in the future. We encouraged her to remain in contact with cancer genetics on an annual basis so we can update her personal and family histories and let her know of advances in cancer genetics that may benefit this family.   Our contact number was provided. [name] questions were answered to her satisfaction, and she knows she is welcome to call us at anytime with additional questions or concerns.   Mal Misty, MS, [facility] Certified Naval [email or web address]

## 2017-04-23 NOTE — Telephone Encounter (Signed)
Reviewed that germline genetic testing revealed no pathogenic mutations. This is considered to be a negative result. Testing was performed through [facility]'s 46-gene Common Hereditary Cancers Panel. [facility] Common Hereditary Cancers Panel includes analysis of the following 46 genes: APC, ATM, AXIN2, BARD1, BMPR1A, BRCA1, BRCA2, BRIP1, CDH1, CDKN2A, CHEK2, CTNNA1, DICER1, EPCAM, GREM1, HOXB13, KIT, MEN1, MLH1, MSH2, MSH3, MSH6, MUTYH, NBN, NF1, NTHL1, PALB2, PDGFRA, PMS2, POLD1, POLE, PTEN, RAD50, RAD51C, RAD51D, SDHA, SDHB, SDHC, SDHD, SMAD4, SMARCA4, STK11, TP53, TSC1, TSC2, and VHL.  Variants of uncertain significance (VUS) were noted in APC and BRIP1. The specific APC variant is c.7032A>C (p.Gln2344His). The specific BRIP1 variant is c.1208G>A (p.Arg403Gln). Discussed that these VUSs should not change clinical management.  For more detailed discussion, please see genetic counseling documentation from 04/23/2017. Result report dated 04/20/2017.

## 2017-04-24 ENCOUNTER — Ambulatory Visit (HOSPITAL_COMMUNITY): Payer: Self-pay | Admitting: Psychiatry

## 2017-04-26 ENCOUNTER — Ambulatory Visit (INDEPENDENT_AMBULATORY_CARE_PROVIDER_SITE_OTHER): Payer: Federal, State, Local not specified - PPO | Admitting: Pharmacist Clinician (PhC)/ Clinical Pharmacy Specialist

## 2017-04-26 DIAGNOSIS — I10 Essential (primary) hypertension: Secondary | ICD-10-CM

## 2017-04-26 MED ORDER — HYDROCHLOROTHIAZIDE 25 MG PO TABS
25.0000 mg | ORAL_TABLET | Freq: Every day | ORAL | 3 refills | Status: DC
Start: 2017-04-26 — End: 2017-05-07

## 2017-04-26 NOTE — Progress Notes (Signed)
Patient ID: Anna Henson                 DOB: 12/29/1970                      MRN: [id number]     HPI: Anna Henson is a 46 y.o. female referred by Dr. Debara Pickett to HTN clinic.  PMH includes NSTEMI  s/p PCI in 12/2016,  CAD s/p stent placement in 09/2014, CKD stage 2, hyperlipidemia, hypertension, a bipolar disorder.  Patient recently had multiple days of elevated BP readings and increased edema after eating high sodium foods last month (2 hot dogs, chips and burger).  Her Lisinopril was increased from 2.59m to 528mdaily on 03/05/17 and instructed to follow up with HTN clinic as soon as possible.  She developed symptomatic hypotension after increasing the lisinopril and eventually cut the dose back to 2.5 mg.  She continues with the triamterene/hctz and metoprolol, but admits to skipping evening metoprolol if her pressure is too low or she's feeling symptomatic.    No other complaints today, no chest pain or recent palpitations.    Current HTN meds:  Lisinopril 2.11m50maily Metoprolol 211m311mice daily Triamterene/HCTZ  37.5/25 daily  BP goal: <130/80  Family History: all siblings with hypertension;   Diet: currently avoiding all high sodium foods  Home BP readings: average 107/74  (range of 28 readings - 92-130/63-80) *Arm BP cuff calibrated during visit at determined to be accurate within 10mm47mrom manual reading*  Wt Readings from Last 3 Encounters:  04/04/17 137 lb 14.4 oz (62.6 kg)  03/12/17 138 lb (62.6 kg)  02/23/17 142 lb 3.2 oz (64.5 kg)   BP Readings from Last 3 Encounters:  04/26/17 98/64  04/04/17 133/84  03/15/17 102/60   Pulse Readings from Last 3 Encounters:  04/26/17 64  04/04/17 65  03/15/17 61     Past Medical History:  Diagnosis Date  . Anxiety   . CAD in native artery    a. 09/2014 Cath/PCI in setting of [place] -[place] 2.25 x 12 mm Promus Premier DES to the RCA;  b. 11/2016 Myoview: EF 56%, no ischemia;  c. 12/2016 NSTEMI/PCI: LM nl, LAd 25p, LCX 30p, RCA 100p  (2.75x38 Promus Premier DES), mRCA 10 ISR, EF 50-55%.  . Chronic kidney disease    she sthinks has stage 2 CKD, has had US doKorealer and has some stenosis , this [place] (chronic [place] disease), stage II   . Depression   . Genetic testing 04/23/2017   Ms. Lufkin underwent genetic counseling and testing for hereditary cancer syndromes on 04/05/2017. Her results were negative for mutations in all 46 genes analyzed by [facility]'s 46-gene Common Hereditary Cancers Panel. Genes analyzed include: APC, ATM, AXIN2, BARD1, BMPR1A, BRCA1, BRCA2, BRIP1, CDH1, CDKN2A, CHEK2, CTNNA1, DICER1, EPCAM, GREM1, HOXB13, KIT, MEN1, MLH1, MSH2, MSH3, MSH6, MUTYH, NBN,  . Heart attack (HCC) [place] HOCM (hypertrophic obstructive cardiomyopathy) (HCC) [place]. 12/2016 Echo: EF 65-70%,  mod conc LVH, dynamic obstruction @ rest, peak velocity of 291 cm/sec w/ peak gradient of 34mmH20mo rwma, Gr1 DD, triv TR, PASP 111mmHg89m HyperMarland Kitchenipidemia   . Hypertension    > 5 years  . Palpitations   . Tobacco abuse 09/13/2014  . Transverse myelitis (HCC)  [facility] Incurrent Outpatient Prescriptions on File Prior to Visit  Medication Sig Dispense Refill  . ARIPiprazole (ABILIFY) 5 MG  tablet Take 1 tablet (5 mg total) by mouth daily. 90 tablet 0  . aspirin 81 MG chewable tablet Chew 1 tablet (81 mg total) by mouth daily.    Marland Kitchen atorvastatin (LIPITOR) 80 MG tablet TAKE 1 TABLET BY MOUTH EVERY DAY 30 tablet 0  . baclofen (LIORESAL) 10 MG tablet Take up to 4/day for muscle spasms 120 each 11  . fluticasone (FLONASE) 50 MCG/ACT nasal spray SHAKE LIQUID AND USE 2 SPRAYS IN EACH NOSTRIL DAILY 16 g 5  . gabapentin (NEURONTIN) 300 MG capsule Take one pill in the morning, two in the afternoon and two po at bedtime 150 capsule 11  . lisinopril (PRINIVIL,ZESTRIL) 2.5 MG tablet Take 1 tablet (2.5 mg total) by mouth daily. 90 tablet 3  . metoprolol (LOPRESSOR) 50 MG tablet Take 0.5 tablets (25 mg total) by mouth 2 (two) times daily. TAKE 1  TABLET BY MOUTH TWICE DAILY. 90 tablet 1  . nitroGLYCERIN (NITROSTAT) 0.4 MG SL tablet Place 1 tablet (0.4 mg total) under the tongue every 5 (five) minutes as needed for chest pain. 25 tablet 1  . OVER THE COUNTER MEDICATION 715 mg.    . OVER THE COUNTER MEDICATION Take 1 capsule by mouth daily.    . pantoprazole (PROTONIX) 40 MG tablet Take 1 tablet (40 mg total) by mouth 2 (two) times daily. 30 tablet 6  . prasugrel (EFFIENT) 10 MG TABS tablet Take 1 tablet (10 mg total) by mouth daily. Take 6 tablets by mouth once and then take 1 tablet daily in the morning. 96 tablet 1  . tamoxifen (NOLVADEX) 20 MG tablet Take 1 tablet (20 mg total) by mouth daily. 90 tablet 3  . traMADol (ULTRAM) 50 MG tablet Take 1 tablet (50 mg total) by mouth 4 (four) times daily as needed. 120 tablet 4   Current Facility-Administered Medications on File Prior to Visit  Medication Dose Route Frequency Provider Last Rate Last Dose  . gadopentetate dimeglumine (MAGNEVIST) injection 16 mL  16 mL Intravenous Once PRN Sater, Nanine Means, MD        No Known Allergies  Blood pressure 98/64, pulse 64.   Essential hypertension:  Patient with essential hypertension and salt sensitivity.  Her home readings are still well below average and sometimes symptomatic.  I offered to discontinue triamterene/hctz, but patient doesn't believe she can do without some diuretic.  Will switch her to straight HCTZ 25 mg once daily, with the option to cut the tablet in half should her BP continue to run low.  If she finds that she needs the entire dose of diuretic we will consider discontinuing lisinopril.  She was encouraged to continue the metoprolol twice daily despite BP readings, as this helps to decrease risk of future palpitations  Tommy Medal PharmD CPP [place] [id number] 03/15/2017 10:08 AM

## 2017-04-26 NOTE — Patient Instructions (Signed)
If you have questions or concerns please call Doyt Castellana/Raquel at [phone]  Your blood pressure today is 98/64 (goal is < 130/80)  Check your blood pressure at home several times per week and keep record of the readings.  Take your BP meds as follows:  Stop triamterene/hctz and start hydrochlorothiazide 25 mg once daily.  If your BP trends to < 175 systolic you can cut the tablet in half  Continue lisinopril 2.5 mg and metoprolol 25 mg twice daily  Bring all of your meds, your BP cuff and your record of home blood pressures to your next appointment.  Exercise as you're able, try to walk approximately 30 minutes per day.  Keep salt intake to a minimum, especially watch canned and prepared boxed foods.  Eat more fresh fruits and vegetables and fewer canned items.  Avoid eating in fast food restaurants.    HOW TO TAKE YOUR BLOOD PRESSURE: . Rest 5 minutes before taking your blood pressure. .  Don't smoke or drink caffeinated beverages for at least 30 minutes before. . Take your blood pressure before (not after) you eat. . Sit comfortably with your back supported and both feet on the floor (don't cross your legs). . Elevate your arm to heart level on a table or a desk. . Use the proper sized cuff. It should fit smoothly and snugly around your bare upper arm. There should be enough room to slip a fingertip under the cuff. The bottom edge of the cuff should be 1 inch above the crease of the elbow. . Ideally, take 3 measurements at one sitting and record the average.

## 2017-04-26 NOTE — Assessment & Plan Note (Addendum)
Patient with essential hypertension and salt sensitivity.  Her home readings are still well below average and sometimes symptomatic.  I offered to discontinue triamterene/hctz, but patient doesn't believe she can do without some diuretic.  Will switch her to straight HCTZ 25 mg once daily, with the option to cut the tablet in half should her BP continue to run low.  If she finds that she needs the entire dose of diuretic we will consider discontinuing lisinopril.  She was encouraged to continue the metoprolol twice daily despite BP readings, as this helps to decrease risk of future palpitations.

## 2017-04-27 ENCOUNTER — Encounter: Payer: Self-pay | Admitting: General Practice

## 2017-04-27 NOTE — Progress Notes (Signed)
[facility] Spiritual Care Note  Followed up with [facility] by phone, providing emotional support for 30 min.  Per pt, she is recognizing that "this [cancer experience] is going to be a rollercoaster ride" and "something I just have to deal with," one step at time.  This realization is helping her cope with emotional swings, distress, and anxiety in the waiting (for more info/results/plan).  She is looking at positives, counting wins, staying busy, and taking friends' encouragement to heart (without taking on others' distress).  She notes that having work to focus on is actually a stress reliever because it helps her stay present.  She values opportunities to share and process, welcoming a f/u call next week.  Will also refer to financial advocate per her request.   Chaplain Shelva Majestic, [facility] Pager [phone] Voicemail [phone]

## 2017-05-01 ENCOUNTER — Encounter: Payer: Self-pay | Admitting: Hematology and Oncology

## 2017-05-01 NOTE — Progress Notes (Signed)
Called pt to follow up on a staff message I received from Lorrin Jackson regarding pt's financial concerns.  Pt mentioned she is receiving bills from all over and was concerned about how to pay them all.  Being that she doesn't have a treatment plan yet I discussed the [employer] card, went over how it works and gave her the number for customer service to apply for it.  She also mentioned she may be receiving radiation so I gave her Ailene Ravel and Cindy's contact info to discuss what options they have in that department and to inquire about the [employer] once she starts radiation treatment.  She was very Patent attorney.

## 2017-05-04 ENCOUNTER — Encounter: Payer: Self-pay | Admitting: General Practice

## 2017-05-04 NOTE — Progress Notes (Signed)
[facility] Spiritual Care Note  Followed up by phone as planned, catching Anna Henson at the nail salon.  She was in good spirits, noting that she's taking care of herself well through this.  We plan to f/u by phone next week for a more detailed conversation.   [place], [place], [facility] Pager [phone] Voicemail [phone]

## 2017-05-06 ENCOUNTER — Observation Stay (HOSPITAL_COMMUNITY)
Admission: EM | Admit: 2017-05-06 | Discharge: 2017-05-07 | Disposition: A | Discharge status: Home / Self Care | Payer: Federal, State, Local not specified - PPO | Attending: Internal Medicine | Admitting: Internal Medicine

## 2017-05-06 ENCOUNTER — Emergency Department (HOSPITAL_COMMUNITY): Payer: Federal, State, Local not specified - PPO

## 2017-05-06 ENCOUNTER — Encounter (HOSPITAL_COMMUNITY): Payer: Self-pay | Admitting: Emergency Medicine

## 2017-05-06 ENCOUNTER — Telehealth: Payer: Self-pay | Admitting: Physician Assistant

## 2017-05-06 DIAGNOSIS — Z87891 Personal history of nicotine dependence: Secondary | ICD-10-CM | POA: Diagnosis not present

## 2017-05-06 DIAGNOSIS — Z853 Personal history of malignant neoplasm of breast: Secondary | ICD-10-CM | POA: Insufficient documentation

## 2017-05-06 DIAGNOSIS — I129 Hypertensive chronic kidney disease with stage 1 through stage 4 chronic kidney disease, or unspecified chronic kidney disease: Secondary | ICD-10-CM | POA: Insufficient documentation

## 2017-05-06 DIAGNOSIS — I252 Old myocardial infarction: Secondary | ICD-10-CM | POA: Insufficient documentation

## 2017-05-06 DIAGNOSIS — Z79899 Other long term (current) drug therapy: Secondary | ICD-10-CM | POA: Insufficient documentation

## 2017-05-06 DIAGNOSIS — N182 Chronic kidney disease, stage 2 (mild): Secondary | ICD-10-CM | POA: Insufficient documentation

## 2017-05-06 DIAGNOSIS — I421 Obstructive hypertrophic cardiomyopathy: Secondary | ICD-10-CM

## 2017-05-06 DIAGNOSIS — I2510 Atherosclerotic heart disease of native coronary artery without angina pectoris: Secondary | ICD-10-CM | POA: Diagnosis not present

## 2017-05-06 DIAGNOSIS — R079 Chest pain, unspecified: Principal | ICD-10-CM

## 2017-05-06 DIAGNOSIS — R0789 Other chest pain: Secondary | ICD-10-CM | POA: Diagnosis present

## 2017-05-06 DIAGNOSIS — Z7982 Long term (current) use of aspirin: Secondary | ICD-10-CM | POA: Diagnosis not present

## 2017-05-06 HISTORY — DX: Headache, unspecified: R51.9

## 2017-05-06 HISTORY — DX: Malignant neoplasm of unspecified site of right female breast: C50.911

## 2017-05-06 HISTORY — DX: Headache: R51

## 2017-05-06 HISTORY — DX: Migraine, unspecified, not intractable, without status migrainosus: G43.909

## 2017-05-06 HISTORY — DX: Gastro-esophageal reflux disease without esophagitis: K21.9

## 2017-05-06 HISTORY — DX: Bipolar disorder, unspecified: F31.9

## 2017-05-06 HISTORY — DX: Pneumonia, unspecified organism: J18.9

## 2017-05-06 LAB — BASIC METABOLIC PANEL
Anion gap: 9 (ref 5–15)
BUN: 11 mg/dL (ref 6–20)
CO2: 21 mmol/L — ABNORMAL LOW (ref 22–32)
Calcium: 9 mg/dL (ref 8.9–10.3)
Chloride: 109 mmol/L (ref 101–111)
Creatinine, Ser: 1.2 mg/dL — ABNORMAL HIGH (ref 0.44–1.00)
GFR calc Af Amer: >60 mL/min (ref >60)
GFR calc non Af Amer: 54 mL/min — ABNORMAL LOW (ref >60)
Glucose, Bld: 104 mg/dL — ABNORMAL HIGH (ref 65–99)
Potassium: 3.8 mmol/L (ref 3.5–5.1)
Sodium: 139 mmol/L (ref 135–145)

## 2017-05-06 LAB — CBC
HCT: 29.4 % — ABNORMAL LOW (ref 36.0–46.0)
Hemoglobin: 10.1 g/dL — ABNORMAL LOW (ref 12.0–15.0)
MCH: 29.4 pg (ref 26.0–34.0)
MCHC: 34.4 g/dL (ref 30.0–36.0)
MCV: 85.7 fL (ref 78.0–100.0)
Platelets: 356 10*3/uL (ref 150–400)
RBC: 3.43 MIL/uL — ABNORMAL LOW (ref 3.87–5.11)
RDW: 14.6 % (ref 11.5–15.5)
WBC: 14.3 10*3/uL — ABNORMAL HIGH (ref 4.0–10.5)

## 2017-05-06 LAB — I-STAT TROPONIN, ED: Troponin i, poc: 0 ng/mL (ref 0.00–0.08)

## 2017-05-06 LAB — TROPONIN I: Troponin I: <0.03 ng/mL (ref <0.03)

## 2017-05-06 MED ORDER — ASPIRIN 81 MG PO CHEW
81.0000 mg | CHEWABLE_TABLET | Freq: Every day | ORAL | Status: DC
  Administered 2017-05-07: 81 mg via ORAL
  Filled 2017-05-06: qty 1

## 2017-05-06 MED ORDER — NITROGLYCERIN 0.4 MG SL SUBL: 0.4000 mg | SUBLINGUAL_TABLET | SUBLINGUAL | Status: DC | PRN

## 2017-05-06 MED ORDER — HYDROCHLOROTHIAZIDE 25 MG PO TABS: 25.0000 mg | ORAL_TABLET | Freq: Every day | ORAL | Status: DC

## 2017-05-06 MED ORDER — METOPROLOL TARTRATE 25 MG PO TABS
25.0000 mg | ORAL_TABLET | Freq: Two times a day (BID) | ORAL | Status: DC
  Administered 2017-05-07: 25 mg via ORAL
  Filled 2017-05-06: qty 1

## 2017-05-06 MED ORDER — LISINOPRIL 2.5 MG PO TABS
2.5000 mg | ORAL_TABLET | Freq: Every day | ORAL | Status: DC
  Administered 2017-05-07: 2.5 mg via ORAL
  Filled 2017-05-06: qty 1

## 2017-05-06 MED ORDER — ATORVASTATIN CALCIUM 80 MG PO TABS
80.0000 mg | ORAL_TABLET | Freq: Every day | ORAL | Status: DC
  Administered 2017-05-07: 80 mg via ORAL
  Filled 2017-05-06: qty 1

## 2017-05-06 MED ORDER — GABAPENTIN 300 MG PO CAPS
600.0000 mg | ORAL_CAPSULE | Freq: Three times a day (TID) | ORAL | Status: DC
  Administered 2017-05-06 – 2017-05-07 (×3): 600 mg via ORAL
  Filled 2017-05-06 (×3): qty 2

## 2017-05-06 MED ORDER — MORPHINE SULFATE (PF) 4 MG/ML IV SOLN
4.0000 mg | Freq: Once | INTRAVENOUS | Status: AC
  Administered 2017-05-06: 4 mg via INTRAVENOUS
  Filled 2017-05-06: qty 1

## 2017-05-06 MED ORDER — PRASUGREL HCL 10 MG PO TABS
10.0000 mg | ORAL_TABLET | Freq: Every day | ORAL | Status: DC
  Administered 2017-05-07: 10 mg via ORAL
  Filled 2017-05-06: qty 1

## 2017-05-06 MED ORDER — ACETAMINOPHEN 325 MG PO TABS
650.0000 mg | ORAL_TABLET | ORAL | Status: DC | PRN
  Administered 2017-05-06 – 2017-05-07 (×3): 650 mg via ORAL
  Filled 2017-05-06 (×3): qty 2

## 2017-05-06 MED ORDER — ENOXAPARIN SODIUM 40 MG/0.4ML ~~LOC~~ SOLN
40.0000 mg | SUBCUTANEOUS | Status: DC
  Administered 2017-05-06: 40 mg via SUBCUTANEOUS
  Filled 2017-05-06: qty 0.4

## 2017-05-06 MED ORDER — MORPHINE SULFATE (PF) 2 MG/ML IV SOLN
2.0000 mg | INTRAVENOUS | Status: DC | PRN
  Administered 2017-05-06: 2 mg via INTRAVENOUS
  Filled 2017-05-06: qty 1

## 2017-05-06 MED ORDER — ONDANSETRON HCL 4 MG/2ML IJ SOLN
4.0000 mg | Freq: Four times a day (QID) | INTRAMUSCULAR | Status: DC | PRN
  Administered 2017-05-07: 4 mg via INTRAVENOUS
  Filled 2017-05-06: qty 2

## 2017-05-06 MED ORDER — BACLOFEN 10 MG PO TABS: 10.0000 mg | ORAL_TABLET | Freq: Four times a day (QID) | ORAL | Status: DC | PRN

## 2017-05-06 MED ORDER — TAMOXIFEN CITRATE 10 MG PO TABS
20.0000 mg | ORAL_TABLET | Freq: Every day | ORAL | Status: DC
  Administered 2017-05-07: 20 mg via ORAL
  Filled 2017-05-06: qty 2

## 2017-05-06 MED ORDER — ONDANSETRON HCL 4 MG/2ML IJ SOLN
4.0000 mg | Freq: Once | INTRAMUSCULAR | Status: AC
  Administered 2017-05-06: 4 mg via INTRAVENOUS
  Filled 2017-05-06: qty 2

## 2017-05-06 MED ORDER — GI COCKTAIL ~~LOC~~
30.0000 mL | Freq: Once | ORAL | Status: AC
  Administered 2017-05-06: 30 mL via ORAL
  Filled 2017-05-06: qty 30

## 2017-05-06 MED ORDER — PANTOPRAZOLE SODIUM 40 MG PO TBEC
40.0000 mg | DELAYED_RELEASE_TABLET | Freq: Two times a day (BID) | ORAL | Status: DC
  Administered 2017-05-06 – 2017-05-07 (×2): 40 mg via ORAL
  Filled 2017-05-06 (×2): qty 1

## 2017-05-06 MED ORDER — ARIPIPRAZOLE 5 MG PO TABS
5.0000 mg | ORAL_TABLET | Freq: Every day | ORAL | Status: DC
  Administered 2017-05-07: 5 mg via ORAL
  Filled 2017-05-06: qty 1

## 2017-05-06 NOTE — Telephone Encounter (Signed)
Patient who had a STEMI in March 2018 paged the after hour answering service regarding elevated BP and chest pain. Her SBP usually in the high 90s, but in the past few days, it has been going up to 160s. It started when she thought she was going to be pulled over a few days ago and became really anxious and developed chest pain. Since then she continue to have intermittent chest pain, she has taken a nitro this morning, I advised her to take up to 3 nitro 5 min apart, if chest pain still persist call 911. As for her BP, I have advised her to take extra 2.5 mg lisinopril when SBP > 160.  Hilbert Corrigan PA Pager: [phone]

## 2017-05-06 NOTE — ED Notes (Signed)
Patient transported to X-ray 

## 2017-05-06 NOTE — H&P (Signed)
Date: 05/06/2017               Patient Name:  Anna Henson MRN: [id number]  DOB: [birth year] Age / Sex: 46 y.o., female   PCP: Wardell Honour, MD         Medical Service: Internal Medicine Teaching Service         Attending Physician: Dr. Lucious Groves, DO    First Contact: Dr. Trilby Drummer Pager: [phone]  Second Contact: Dr. Benjamine Mola Pager: [phone]       After Hours (After 5p/  First Contact Pager: [phone]  weekends / holidays): Second Contact Pager: [phone]   Chief Complaint: Chest Pain  History of Present Illness: Anna Henson is a 46 yo F with a Hx of CAD s/p sten x2 (2016, 2018); transverse myelitis, Hypertension, CKD and recent breast cancer diagnosis presented with 2 days of increasing chest pain. The pain initially began when she was anxious after being pulled over on Friday. She has continued to have intermittent chest pain since that time and began worsening overnight and her blood pressure went up as well into the 794I systolic (normal for her is 016P systolic). The pain is located at the inferior portion of her sternum, is not tender to palpation, and is described as 2/10 currently, 8/10 before pain medication sharp pain with associated R shoulder pain, left arm pain, and sweating. She initially took all her usual blood pressure medications with no relief. She then called her cardiologist who instructed her to take nitro. She took 2, which did not relieve her pain, so she called EMS. She subsequently received 2 further nitro's, which also did not improve her pain; but she does feel that they improved her blood pressure. She denies nausea, headache prior to nitro administration. She endorses feeling chills at times, and shortness of breath.  In the ED, EKG was nonischemic, Initial troponin was negative, BMP showed no significant derangements, CBC showed chronically elevated WBC. She received morphine. She will be admitted for chest pain rule out and further workup.  Meds:  Current  Meds  Medication Sig  . ARIPiprazole (ABILIFY) 5 MG tablet Take 1 tablet (5 mg total) by mouth daily.  Marland Kitchen aspirin 81 MG chewable tablet Chew 1 tablet (81 mg total) by mouth daily.  Marland Kitchen atorvastatin (LIPITOR) 80 MG tablet TAKE 1 TABLET BY MOUTH EVERY DAY  . baclofen (LIORESAL) 10 MG tablet Take up to 4/day for muscle spasms  . fluticasone (FLONASE) 50 MCG/ACT nasal spray SHAKE LIQUID AND USE 2 SPRAYS IN EACH NOSTRIL DAILY (Patient taking differently: SHAKE LIQUID AND USE 2 SPRAYS IN EACH NOSTRIL DAILY as need rhinitis)  . gabapentin (NEURONTIN) 300 MG capsule Take one pill in the morning, two in the afternoon and two po at bedtime  . hydrochlorothiazide (HYDRODIURIL) 25 MG tablet Take 1 tablet (25 mg total) by mouth daily.  Marland Kitchen lisinopril (PRINIVIL,ZESTRIL) 2.5 MG tablet Take 1 tablet (2.5 mg total) by mouth daily.  . metoprolol (LOPRESSOR) 50 MG tablet Take 0.5 tablets (25 mg total) by mouth 2 (two) times daily. TAKE 1 TABLET BY MOUTH TWICE DAILY.  . nitroGLYCERIN (NITROSTAT) 0.4 MG SL tablet Place 1 tablet (0.4 mg total) under the tongue every 5 (five) minutes as needed for chest pain.  . pantoprazole (PROTONIX) 40 MG tablet Take 1 tablet (40 mg total) by mouth 2 (two) times daily.  . prasugrel (EFFIENT) 10 MG TABS tablet Take 1 tablet (10 mg total) by mouth daily. Take 6  tablets by mouth once and then take 1 tablet daily in the morning.  . Probiotic Product (PROBIOTIC PO) Take 1 tablet by mouth daily.  . tamoxifen (NOLVADEX) 20 MG tablet Take 1 tablet (20 mg total) by mouth daily.  . traMADol (ULTRAM) 50 MG tablet Take 1 tablet (50 mg total) by mouth 4 (four) times daily as needed.     Allergies: Allergies as of 05/06/2017  . (No Known Allergies)   Past Medical History:  Diagnosis Date  . Anxiety   . CAD in native artery    a. 09/2014 Cath/PCI in setting of [place] - s/p 2.25 x 12 mm Promus Premier DES to the RCA;  b. 11/2016 Myoview: EF 56%, no ischemia;  c. 12/2016 NSTEMI/PCI: LM nl, LAd 25p,  LCX 30p, RCA 100p (2.75x38 Promus Premier DES), mRCA 10 ISR, EF 50-55%.  . Chronic kidney disease    she sthinks has stage 2 CKD, has had US doppler and has some stenosis , this was done in [place]   . CKD (chronic kidney disease), stage II   . Depression   . Genetic testing 04/23/2017   Ms. Mccain underwent genetic counseling and testing for hereditary cancer syndromes on 04/05/2017. Her results were negative for mutations in all 46 genes analyzed by [facility]'s 46-gene Common Hereditary Cancers Panel. Genes analyzed include: APC, ATM, AXIN2, BARD1, BMPR1A, BRCA1, BRCA2, BRIP1, CDH1, CDKN2A, CHEK2, CTNNA1, DICER1, EPCAM, GREM1, HOXB13, KIT, MEN1, MLH1, MSH2, MSH3, MSH6, MUTYH, NBN,  . Heart attack ([place])   . HOCM (hypertrophic obstructive cardiomyopathy) ([place])    a. 12/2016 Echo: EF 65-70%,  mod conc LVH, dynamic obstruction @ rest, peak velocity of 291 cm/sec w/ peak gradient of 39mHg, no rwma, Gr1 DD, triv TR, PASP 176mg.  . Marland Kitchenyperlipidemia   . Hypertension    > 5 years  . Palpitations   . Tobacco abuse 09/13/2014  . Transverse myelitis ([facility]    Family History:  Family History  Problem Relation Age of Onset  . Diabetes Mother   . Heart disease Mother   . Hyperlipidemia Mother   . Hypertension Mother   . Lung cancer Father   . Drug abuse Father   . Brain cancer Father [id number]. Bone cancer Father 7332. Drug abuse Sister   . Mental illness Brother   . Mental illness Maternal Grandmother   . Stomach cancer Paternal Grandmother [phone]     d.75   Social History:  Social History   Social History  . Marital status: Single    Spouse name: N/A  . Number of children: 0  . Years of education: masters   Occupational History  .      [employer]   Social History Main Topics  . Smoking status: Former Smoker    Packs/day: 0.50    Types: Cigarettes    Quit date: 10/05/2015  . Smokeless tobacco: Never Used     Comment: on patch since 09/09/14  . Alcohol use 0.0 oz/week     Comment:  occas  . Drug use: No  . Sexual activity: No   Other Topics Concern  . None   Social History Narrative   Consumes 2 cups of caffeine daily   Review of Systems: A complete ROS was negative except as per HPI.  Physical Exam: Blood pressure (!) 152/83, pulse (!) 58, temperature 98.3 F (36.8 C), temperature source Oral, resp. rate 10, height '5\' 2"'  (1.575 m), weight 135 lb 12.8  oz (61.6 kg), SpO2 100 %. Physical Exam  Constitutional: She is oriented to person, place, and time. She appears well-developed and well-nourished. No distress.  HENT:  Head: Normocephalic and atraumatic.  Eyes: EOM are normal. No scleral icterus.  Cardiovascular: Normal rate, regular rhythm and normal heart sounds.  Exam reveals no gallop and no friction rub.   No murmur heard. Pulmonary/Chest: Effort normal and breath sounds normal. No respiratory distress. She has no wheezes. She has no rales.  Abdominal: Soft. Bowel sounds are normal. She exhibits no distension. There is no tenderness.  Musculoskeletal: Normal range of motion. She exhibits no edema or deformity.  Neurological: She is alert and oriented to person, place, and time.  Skin: Skin is warm and dry.    EKG: personally reviewed and I agree with: Sinus rhythm Anterior infarct, old Since last tracing rate slower  CXR: personally reviewed and I agree with: IMPRESSION: No active cardiopulmonary disease.  Assessment & Plan by Problem:  Chest Pain: Sternal chest pain in the setting of CAD s/p stent x2. - 35m IV q2h PRN - Serial Troponin - Repeat EKG in AM - CBC & BMP in AM  Breast Cancer - Tamoxifen 250mDaily  CAD:  s/p stent x2. - ASA 8116maily - Atorvastatin 66m65mily - Prasugrel 10mg59mly - Lisinopril 2.5mg D66my - Metoprolol tartrate 25mg B25mEssential Hypertension: - Hydrochlorothiazide 25mg Da65m - Lisinopril 2.5mg Dail66m Metoprolol tartrate 25mg BID 1mnsverse Myelitis: - Gabapentin 600mg TID  76m: - Protonix  40mg BID  M4me Spasms - baclofen 10mg q6h PRN92mod Disorder - Abilify 5mg Daily  F/7m: Heart Diet, NPO at Midnight  DVT Prophylaxis: Lovenox 40 Code Status: Full  Dispo: Admit patient to Observation with expected length of stay less than 2 midnights.  Signed: Alexander MelvNeva SeatPager: [phone]

## 2017-05-06 NOTE — Plan of Care (Signed)
Problem: Safety: Goal: Ability to remain free from injury will improve Outcome: Progressing Pt has had no new injuries during this shift. Pt has been educated on using her call bell when she needs assistance getting out of bed.

## 2017-05-06 NOTE — ED Triage Notes (Signed)
Pt in from home via [facility] with c/o sharp, stabbing cp x 2 days. Pain radiates to L arm, started Friday when she was nearly pulled over while driving. Pt states she became very anxious, diaphoretic and nauseous. Pain has remained x 2 days. Hx of MI in March with 2 stents placed. Pain went from 8/10 > 4/10 with 2 NTG and 324 ASA. Denies sob

## 2017-05-06 NOTE — ED Provider Notes (Signed)
[facility] DEPT Provider Note   CSN: [id number] Arrival date & time: 05/06/17  1133     History   Chief Complaint Chief Complaint  Patient presents with  . Chest Pain    HPI Anna Henson is a 46 y.o. female.  HPI  Anna Henson is a 46 y.o. female, with a history of CAD, MI, CKD stage II, anxiety, breast cancer, and HTN, presenting to the ED with chest pain for the last two days. States she had a stressful incident that preceded her symptoms. States she had "an anxiety reaction" at that time with diaphoresis and nausea as well as chest pain, but then has been having continued intermittent chest pain since then. Pain is sharp, retrosternal, 3/10 down from 8/10 earlier today, nonradiating, lasts for about a minute at a time. States pain is similar in nature to her pain she experienced with her MI, but is currently intermittent rather than constant. Does not seem to be connected with any particular activities or actions. Does not seem to come on with exertion. Concurrent with right upper back pain. Does not  Called her cardiologist's office who advised her to take her nitro. She took 2x nitro prior to EMS arrival and then received 2 more nitro as well as '324mg'$  ASA with improvement in her pain. Denies SOB, N/V/D, fever/chills, diaphoresis, abdominal pain, or any other complaints.   Last cardiac cath was 12/2016 with 2x RCA stents placed. Patient has active breast cancer in the right breast, diagnosed in June 2018 on Tamoxifin. Scheduled for lumpectomy in October 2018 and then subsequent radiation.   Cardiologist: Hilty  Past Medical History:  Diagnosis Date  . Anxiety   . CAD in native artery    a. 09/2014 Cath/PCI in setting of [place] - s/p 2.25 x 12 mm Promus Premier DES to the RCA;  b. 11/2016 Myoview: EF 56%, no ischemia;  c. 12/2016 NSTEMI/PCI: LM nl, LAd 25p, LCX 30p, RCA 100p (2.75x38 Promus Premier DES), mRCA 10 ISR, EF 50-55%.  . Chronic kidney disease    she sthinks has stage 2  CKD, has had US doppler and has some stenosis , this was done in [place]   . CKD (chronic kidney disease), stage II   . Depression   . Genetic testing 04/23/2017   Ms. Knopf underwent genetic counseling and testing for hereditary cancer syndromes on 04/05/2017. Her results were negative for mutations in all 46 genes analyzed by [facility]'s 46-gene Common Hereditary Cancers Panel. Genes analyzed include: APC, ATM, AXIN2, BARD1, BMPR1A, BRCA1, BRCA2, BRIP1, CDH1, CDKN2A, CHEK2, CTNNA1, DICER1, EPCAM, GREM1, HOXB13, KIT, MEN1, MLH1, MSH2, MSH3, MSH6, MUTYH, NBN,  . Heart attack ([place])   . HOCM (hypertrophic obstructive cardiomyopathy) ([place])    a. 12/2016 Echo: EF 65-70%,  mod conc LVH, dynamic obstruction @ rest, peak velocity of 291 cm/sec w/ peak gradient of 10mHg, no rwma, Gr1 DD, triv TR, PASP 156mg.  . Marland Kitchenyperlipidemia   . Hypertension    > 5 years  . Palpitations   . Tobacco abuse 09/13/2014  . [facility]    Patient Active Problem List   Diagnosis Date Noted  . Chest pain 05/06/2017  . Genetic testing 04/23/2017  . Malignant neoplasm of lower-outer quadrant of right breast of female, estrogen receptor positive ([place]/21/2018  . Dysesthesia 03/12/2017  . Palpitations 02/23/2017  . S/P cardiac cath (12/2016) a. acute total occlusion of the RCA tx w/PCI DES, b. mild nonobs LAD/LCx stenosis, c. mild segmental LV  sys dx w/ sev hypokinesis, LVEF 50-55% 01/02/2017  . Non-ST elevation myocardial infarction (NSTEMI), subsequent episode of care ([place]) 12/31/2016  . Malignant hypertension   . Chronic kidney disease   . ST elevation myocardial infarction involving right coronary artery ([place])   . Abnormal EKG   . Nausea   . Ischemic cardiomyopathy   . Muscle spasms of both lower extremities 04/17/2016  . Gait disturbance 01/25/2016  . Transverse myelitis ([place]) 12/14/2015  . Acute-on-chronic kidney injury ([place]) 11/12/2015  . Neck pain 11/12/2015  . Hypokalemia 11/12/2015  .  Abnormal MRI, neck 11/12/2015  . Numbness 11/12/2015  . Hypertension   . Hyperlipidemia   . CAD in native artery   . Facial numbness   . Right arm numbness   . Throat pain 11/09/2015  . Drooling 11/09/2015  . CAD (coronary artery disease), native coronary artery 09/15/2014  . Headache 09/15/2014  . UTI (urinary tract infection): Probable 09/14/2014  . Candida infection of genital region 09/14/2014  . Unstable angina ([place]) 09/13/2014  . HLD (hyperlipidemia) 09/13/2014  . Otitis 09/13/2014  . ACS (acute coronary syndrome) ([place]) 09/13/2014  . Tobacco abuse 09/13/2014  . Essential hypertension 09/09/2014    Past Surgical History:  Procedure Laterality Date  . CORONARY ANGIOPLASTY WITH STENT PLACEMENT     L main OK, LAD OK, CFX 30%, RCA 90%-0% w/ 2.25 x 12 mm Promus Premier DES, EF 55%  . CORONARY/GRAFT ACUTE MI REVASCULARIZATION N/A 12/31/2016   Procedure: Coronary/Graft Acute MI Revascularization;  Surgeon: Sherren Mocha, MD;  Location: [facility] CV LAB;  Service: Cardiovascular;  Laterality: N/A;  . HERNIA REPAIR    . LEFT HEART CATH AND CORONARY ANGIOGRAPHY N/A 12/31/2016   Procedure: Left Heart Cath and Coronary Angiography;  Surgeon: Sherren Mocha, MD;  Location: [facility] CV LAB;  Service: Cardiovascular;  Laterality: N/A;  . LEFT HEART CATHETERIZATION WITH CORONARY ANGIOGRAM N/A 09/14/2014   Procedure: LEFT HEART CATHETERIZATION WITH CORONARY ANGIOGRAM;  Surgeon: Burnell Blanks, MD;  Location: [facility] CATH LAB;  Service: Cardiovascular;  Laterality: N/A;  . PERCUTANEOUS CORONARY STENT INTERVENTION (PCI-S)  09/14/2014   Procedure: PERCUTANEOUS CORONARY STENT INTERVENTION (PCI-S);  Surgeon: Burnell Blanks, MD;  Location: [facility] C[facility] LAB;  Service: Cardiovascular;;  . TONSILLECTOMY  2005   10 years ago    OB History    No data available       Home Medications    Prior to Admission medications   Medication Sig Start Date End Date Taking? Authorizing Provider    ARIPiprazole (ABILIFY) 5 MG tablet Take 1 tablet (5 mg total) by mouth daily. 02/15/17  Yes Arfeen, Arlyce Harman, MD  aspirin 81 MG chewable tablet Chew 1 tablet (81 mg total) by mouth daily. 09/16/14  Yes Nita Sells, MD  atorvastatin (LIPITOR) 80 MG tablet TAKE 1 TABLET BY MOUTH EVERY DAY 08/25/16  Yes Wendie Agreste, MD  baclofen (LIORESAL) 10 MG tablet Take up to 4/day for muscle spasms 03/12/17  Yes Sater, Nanine Means, MD  fluticasone (FLONASE) 50 MCG/ACT nasal spray SHAKE LIQUID AND USE 2 SPRAYS IN EACH NOSTRIL DAILY Patient taking differently: SHAKE LIQUID AND USE 2 SPRAYS IN EACH NOSTRIL DAILY as need rhinitis 12/04/16  Yes Jeffery, Chelle, PA-C  gabapentin (NEURONTIN) 300 MG capsule Take one pill in the morning, two in the afternoon and two po at bedtime 03/12/17  Yes Sater, Nanine Means, MD  hydrochlorothiazide (HYDRODIURIL) 25 MG tablet Take 1 tablet (25 mg total) by mouth daily. 04/26/17 07/25/17  Yes Hilty, Nadean Corwin, MD  lisinopril (PRINIVIL,ZESTRIL) 2.5 MG tablet Take 1 tablet (2.5 mg total) by mouth daily. 03/15/17  Yes Hilty, Nadean Corwin, MD  metoprolol (LOPRESSOR) 50 MG tablet Take 0.5 tablets (25 mg total) by mouth 2 (two) times daily. TAKE 1 TABLET BY MOUTH TWICE DAILY. 01/10/17  Yes Rogelia Mire, NP  nitroGLYCERIN (NITROSTAT) 0.4 MG SL tablet Place 1 tablet (0.4 mg total) under the tongue every 5 (five) minutes as needed for chest pain. 10/20/16 05/06/17 Yes Strader, Fransisco Hertz, PA-C  pantoprazole (PROTONIX) 40 MG tablet Take 1 tablet (40 mg total) by mouth 2 (two) times daily. 01/02/17  Yes Carlota Raspberry, Tiffany, PA-C  prasugrel (EFFIENT) 10 MG TABS tablet Take 1 tablet (10 mg total) by mouth daily. Take 6 tablets by mouth once and then take 1 tablet daily in the morning. 04/04/17 07/03/17 Yes Hilty, Nadean Corwin, MD  Probiotic Product (PROBIOTIC PO) Take 1 tablet by mouth daily.   Yes [provider]  tamoxifen (NOLVADEX) 20 MG tablet Take 1 tablet (20 mg total) by mouth daily. 04/04/17   Yes Nicholas Lose, MD  traMADol (ULTRAM) 50 MG tablet Take 1 tablet (50 mg total) by mouth 4 (four) times daily as needed. 08/21/16  Yes Sater, Nanine Means, MD  triamterene-hydrochlorothiazide (MAXZIDE-25) 37.5-25 MG tablet Take 1 tablet by mouth daily.    [provider]    Family History Family History  Problem Relation Age of Onset  . Diabetes Mother   . Heart disease Mother   . Hyperlipidemia Mother   . Hypertension Mother   . Lung cancer Father   . Drug abuse Father   . Brain cancer Father 24  . Bone cancer Father 107  . Drug abuse Sister   . Mental illness Brother   . Mental illness Maternal Grandmother   . Stomach cancer Paternal Grandmother 72       d.75    Social History Social History  Substance Use Topics  . Smoking status: Former Smoker    Packs/day: 0.50    Types: Cigarettes    Quit date: 10/05/2015  . Smokeless tobacco: Never Used     Comment: on patch since 09/09/14  . Alcohol use 0.0 oz/week     Comment: occas     Allergies   Patient has no known allergies.   Review of Systems Review of Systems  Constitutional: Negative for chills, diaphoresis and fever.  Respiratory: Negative for cough and shortness of breath.   Cardiovascular: Positive for chest pain. Negative for palpitations and leg swelling.  Gastrointestinal: Negative for abdominal pain, nausea and vomiting.  Neurological: Negative for weakness and numbness.  All other systems reviewed and are negative.    Physical Exam Updated Vital Signs BP 138/81   Pulse (!) 48   Temp 98.6 F (37 C) (Oral)   Resp 11   Ht '5\' 2"'$  (1.575 m)   Wt 62.6 kg (138 lb)   SpO2 100%   BMI 25.24 kg/m   Physical Exam  Constitutional: She appears well-developed and well-nourished. No distress.  HENT:  Head: Normocephalic and atraumatic.  Eyes: Conjunctivae are normal.  Neck: Neck supple.  Cardiovascular: Normal rate, regular rhythm, normal heart sounds and intact distal pulses.   Pulmonary/Chest:  Effort normal and breath sounds normal. No respiratory distress.  Abdominal: Soft. There is no tenderness. There is no guarding.  Musculoskeletal: She exhibits no edema.  Lymphadenopathy:    She has no cervical adenopathy.  Neurological: She is alert.  Skin: Skin is warm and dry. She is not diaphoretic.  Psychiatric: She has a normal mood and affect. Her behavior is normal.  Nursing note and vitals reviewed.    ED Treatments / Results  Labs (all labs ordered are listed, but only abnormal results are displayed) Labs Reviewed  BASIC METABOLIC PANEL - Abnormal; Notable for the following:       Result Value   CO2 21 (*)    Glucose, Bld 104 (*)    Creatinine, Ser 1.20 (*)    GFR calc non Af Amer 54 (*)    All other components within normal limits  CBC - Abnormal; Notable for the following:    WBC 14.3 (*)    RBC 3.43 (*)    Hemoglobin 10.1 (*)    HCT 29.4 (*)    All other components within normal limits  I-STAT TROPONIN, ED    Hemoglobin  Date Value Ref Range Status  05/06/2017 10.1 (L) 12.0 - 15.0 g/dL Final  01/01/2017 11.3 (L) 12.0 - 15.0 g/dL Final  12/31/2016 10.9 (L) 12.0 - 15.0 g/dL Final  12/31/2016 10.4 (L) 12.0 - 15.0 g/dL Final   HGB  Date Value Ref Range Status  04/04/2017 11.6 11.6 - 15.9 g/dL Final   BUN  Date Value Ref Range Status  05/06/2017 11 6 - 20 mg/dL Final  04/04/2017 13.9 7.0 - 26.0 mg/dL Final  01/22/2017 12 6 - 24 mg/dL Final  01/10/2017 13 7 - 25 mg/dL Final  01/02/2017 8 6 - 20 mg/dL Final  12/31/2016 6 6 - 20 mg/dL Final   Creatinine  Date Value Ref Range Status  04/04/2017 1.3 (H) 0.6 - 1.1 mg/dL Final   Creat  Date Value Ref Range Status  01/10/2017 1.41 (H) 0.50 - 1.10 mg/dL Final  07/03/2016 1.12 (H) 0.50 - 1.10 mg/dL Final  11/24/2015 1.19 (H) 0.50 - 1.10 mg/dL Final  10/25/2015 0.98 0.50 - 1.10 mg/dL Final   Creatinine, Ser  Date Value Ref Range Status  05/06/2017 1.20 (H) 0.44 - 1.00 mg/dL Final  01/22/2017 1.30 (H)  0.57 - 1.00 mg/dL Final  01/02/2017 1.39 (H) 0.44 - 1.00 mg/dL Final  12/31/2016 0.94 0.44 - 1.00 mg/dL Final     EKG  EKG Interpretation  Date/Time:  Sunday May 06 2017 11:40:45 EDT Ventricular Rate:  55 PR Interval:    QRS Duration: 88 QT Interval:  449 QTC Calculation: 430 R Axis:   45 Text Interpretation:  Sinus rhythm Anterior infarct, old Since last tracing rate slower Confirmed by Pattricia Boss [phone]) on 05/06/2017 12:41:13 PM       Radiology Dg Chest 2 View  Result Date: 05/06/2017 CLINICAL DATA:  Chest pain for 2 days EXAM: CHEST  2 VIEW COMPARISON:  12/31/2016 FINDINGS: Heart and mediastinal contours are within normal limits. No focal opacities or effusions. No acute bony abnormality. IMPRESSION: No active cardiopulmonary disease. Electronically Signed   By: Rolm Baptise M.D.   On: 05/06/2017 12:41    Procedures Procedures (including critical care time)  Medications Ordered in ED Medications  morphine 4 MG/ML injection 4 mg (4 mg Intravenous Given 05/06/17 1400)  ondansetron (ZOFRAN) injection 4 mg (4 mg Intravenous Given 05/06/17 1400)     Initial Impression / Assessment and Plan / ED Course  I have reviewed the triage vital signs and the nursing notes.  Pertinent labs & imaging results that were available during my care of the patient were reviewed by me and considered in my medical  decision making (see chart for details).  Clinical Course as of May 06 1513  Sun May 06, 2017  1501 Spoke with Ignacia Marvel, IM resident, who agrees to admit the patient. He will place admission orders.  [SJ]    Clinical Course User Index [SJ] Joy, Shawn C, PA-C    Patient presents for evaluation of atypical chest pain. Patient is nontoxic appearing, afebrile, not tachycardic, not tachypneic, not hypotensive, maintains SPO2 of 100% on room air, and is in no apparent distress. Initial troponin negative.  Thoracic aortic dissection was considered, but my suspicion is low. Normal  chest x-ray with normal mediastinum. Pulses and extremities equal bilaterally. Negative troponin. No history of thoracic aortic aneurysm. Chest pain is atypical for dissection. Admission due to chest pain similar to previous MI as well as the added complexity of breast cancer.   Findings and plan of care discussed with Pattricia Boss, MD.   Vitals:   05/06/17 1345 05/06/17 1400 05/06/17 1438 05/06/17 1511  BP: (!) 145/84 132/80 140/78 128/75  Pulse: (!) 52 (!) 56 (!) 53 (!) 54  Resp: _0 Temp:      TempSrc:      SpO2: 100% 100% 100% 100%  Weight:      Height:         Final Clinical Impressions(s) / ED Diagnoses   Final diagnoses:  Chest pain, unspecified type    New Prescriptions New Prescriptions   No medications on file     Layla Maw 05/06/17 1514    Pattricia Boss, MD 05/06/17 [phone]

## 2017-05-06 NOTE — ED Notes (Signed)
Attempted to call report to 3W 

## 2017-05-07 ENCOUNTER — Encounter (HOSPITAL_COMMUNITY): Payer: Self-pay | Admitting: General Practice

## 2017-05-07 ENCOUNTER — Observation Stay (HOSPITAL_BASED_OUTPATIENT_CLINIC_OR_DEPARTMENT_OTHER): Payer: Federal, State, Local not specified - PPO

## 2017-05-07 DIAGNOSIS — I2510 Atherosclerotic heart disease of native coronary artery without angina pectoris: Secondary | ICD-10-CM

## 2017-05-07 DIAGNOSIS — N182 Chronic kidney disease, stage 2 (mild): Secondary | ICD-10-CM | POA: Diagnosis not present

## 2017-05-07 DIAGNOSIS — I25110 Atherosclerotic heart disease of native coronary artery with unstable angina pectoris: Secondary | ICD-10-CM | POA: Diagnosis not present

## 2017-05-07 DIAGNOSIS — Z955 Presence of coronary angioplasty implant and graft: Secondary | ICD-10-CM | POA: Diagnosis not present

## 2017-05-07 DIAGNOSIS — I421 Obstructive hypertrophic cardiomyopathy: Secondary | ICD-10-CM | POA: Diagnosis not present

## 2017-05-07 DIAGNOSIS — R079 Chest pain, unspecified: Secondary | ICD-10-CM

## 2017-05-07 DIAGNOSIS — I10 Essential (primary) hypertension: Secondary | ICD-10-CM | POA: Diagnosis not present

## 2017-05-07 DIAGNOSIS — I129 Hypertensive chronic kidney disease with stage 1 through stage 4 chronic kidney disease, or unspecified chronic kidney disease: Secondary | ICD-10-CM | POA: Diagnosis not present

## 2017-05-07 DIAGNOSIS — Z79899 Other long term (current) drug therapy: Secondary | ICD-10-CM

## 2017-05-07 LAB — NM MYOCAR MULTI W/SPECT W/WALL MOTION / EF
Estimated workload: 1 METS
Exercise duration (min): 5 min
Exercise duration (sec): 0 s
Peak HR: 121 {beats}/min
Rest HR: 56 {beats}/min

## 2017-05-07 LAB — TROPONIN I: Troponin I: <0.03 ng/mL (ref <0.03)

## 2017-05-07 MED ORDER — TECHNETIUM TC 99M TETROFOSMIN IV KIT
10.0000 | PACK | Freq: Once | INTRAVENOUS | Status: AC | PRN
  Administered 2017-05-07: 10 via INTRAVENOUS

## 2017-05-07 MED ORDER — REGADENOSON 0.4 MG/5ML IV SOLN
0.4000 mg | Freq: Once | INTRAVENOUS | Status: AC
  Administered 2017-05-07: 0.4 mg via INTRAVENOUS

## 2017-05-07 MED ORDER — TECHNETIUM TC 99M TETROFOSMIN IV KIT
30.0000 | PACK | Freq: Once | INTRAVENOUS | Status: AC | PRN
  Administered 2017-05-07: 30 via INTRAVENOUS

## 2017-05-07 MED ORDER — REGADENOSON 0.4 MG/5ML IV SOLN
INTRAVENOUS | Status: AC
  Filled 2017-05-07: qty 5

## 2017-05-07 NOTE — Discharge Summary (Signed)
Name: Anna Henson MRN: [id number] DOB: 1971-02-17 46 y.o. PCP: Wardell Honour, MD  Date of Admission: 05/06/2017 11:33 AM Date of Discharge: 05/07/2017 Attending Physician: Lucious Groves, DO  Discharge Diagnosis:  Active Problems:   Chest pain   HOCM (hypertrophic obstructive cardiomyopathy) [facility])   Discharge Medications: Allergies as of 05/07/2017   No Known Allergies     Medication List    STOP taking these medications   hydrochlorothiazide 25 MG tablet Commonly known as:  HYDRODIURIL   triamterene-hydrochlorothiazide 37.5-25 MG tablet Commonly known as:  MAXZIDE-25     TAKE these medications   ARIPiprazole 5 MG tablet Commonly known as:  ABILIFY Take 1 tablet (5 mg total) by mouth daily.   aspirin 81 MG chewable tablet Chew 1 tablet (81 mg total) by mouth daily.   atorvastatin 80 MG tablet Commonly known as:  LIPITOR TAKE 1 TABLET BY MOUTH EVERY DAY   baclofen 10 MG tablet Commonly known as:  LIORESAL Take up to 4/day for muscle spasms   fluticasone 50 MCG/ACT nasal spray Commonly known as:  FLONASE SHAKE LIQUID AND USE 2 SPRAYS IN EACH NOSTRIL DAILY What changed:  See the new instructions.   gabapentin 300 MG capsule Commonly known as:  NEURONTIN Take one pill in the morning, two in the afternoon and two po at bedtime   lisinopril 2.5 MG tablet Commonly known as:  PRINIVIL,ZESTRIL Take 1 tablet (2.5 mg total) by mouth daily.   metoprolol tartrate 50 MG tablet Commonly known as:  LOPRESSOR Take 0.5 tablets (25 mg total) by mouth 2 (two) times daily. TAKE 1 TABLET BY MOUTH TWICE DAILY.   nitroGLYCERIN 0.4 MG SL tablet Commonly known as:  NITROSTAT Place 1 tablet (0.4 mg total) under the tongue every 5 (five) minutes as needed for chest pain.   pantoprazole 40 MG tablet Commonly known as:  PROTONIX Take 1 tablet (40 mg total) by mouth 2 (two) times daily.   prasugrel 10 MG Tabs tablet Commonly known as:  EFFIENT Take 1 tablet (10 mg  total) by mouth daily. Take 6 tablets by mouth once and then take 1 tablet daily in the morning.   PROBIOTIC PO Take 1 tablet by mouth daily.   tamoxifen 20 MG tablet Commonly known as:  NOLVADEX Take 1 tablet (20 mg total) by mouth daily.   traMADol 50 MG tablet Commonly known as:  ULTRAM Take 1 tablet (50 mg total) by mouth 4 (four) times daily as needed.       Disposition and follow-up:   Anna Henson was discharged from [facility] in Good condition.  At the hospital follow up visit please address:  1. Chest Pain: Pain determined to be nonischemic. Likely secondary to anxiety / panic attack. Please assess for need for medication verses nonpharmacologic strategies for stress. She is also following up with her therapist.  2. Hypertension: Diuretics discontinued in the setting of HOCM on [facility] in march. Metoprolol recommended to remain at current does due to bradycardia. Please titrate lisinopril as needed for hypertension.  3.  Labs / imaging needed at time of follow-up: None  4.  Pending labs/ test needing follow-up: None  Follow-up Appointments: Follow-up Information    Wardell Honour, MD. Call in 1 week(s).   Specialty:  Family Medicine Why:  Please make an appointment in the next 1 - 2 weeks Contact information: [address] [place] [address] [phone]  Hospital Course by problem list:   1. Chest Pain: Patient presented with 2 days of intemittent chest pain that started worsening the morning of admission. The pain initially began when she was anxious after being pulled over. The morning of admission her pain became worse and her blood pressure was in the 017P systolic (normal for her is 102H systolic). The pain was 8/10, sharp, sternal, not tender to palpation and associated with diaphoresis and some shortness of breath. Pain was not relieved by nitro but was relieved with morphine to 2/10. EKG was nonischemic x2, Troponin  negative x2, BMP showed no significant derangements, CBC showed chronically elevated WBC. She required one further dose of pain medication and felt better on supportive therapy by the following morning.  Due to her history of CAD s/p PCI x2, most recently 4 months prior and her history of presenting several weeks prior to her previous stenting with negative troponin's and normal EKG a NM stress test was performed. This test was normal and low risk. Her chest pain may have been due to anxiety / panic attacks. She was advised to follow up with her cardiologist, primary care, and therapist.  2. Hypertension: The patients antihypertensives were adjusted based upon review of her echocardiogram from march showing normal LVF with HOCM with peak gradient at rest of 15mmHg. In the setting of HCM, her diuretics were discontinued, her metoprolol was maintained at current dose due to bradycardia, and her lisinopril was continued with the suggestion that this medication be titrated later as needed.  Discharge Vitals:   BP 122/70   Pulse 89   Temp 98.3 F (36.8 C) (Oral)   Resp 14   Ht 5\' 2"  (1.575 m)   Wt 136 lb 14.4 oz (62.1 kg)   SpO2 100%   BMI 25.04 kg/m   Pertinent Labs, Studies, and Procedures:  CBC Latest Ref Rng & Units 05/06/2017 04/04/2017 01/01/2017  WBC 4.0 - 10.5 K/uL 14.3(H) 9.0 14.0(H)  Hemoglobin 12.0 - 15.0 g/dL 10.1(L) 11.6 11.3(L)  Hematocrit 36.0 - 46.0 % 29.4(L) 33.5(L) 32.2(L)  Platelets 150 - 400 K/uL 356 352 331   BMP Latest Ref Rng & Units 05/06/2017 04/04/2017 01/22/2017  Glucose 65 - 99 mg/dL 104(H) 83 85  BUN 6 - 20 mg/dL 11 13.9 12  Creatinine 0.44 - 1.00 mg/dL 1.20(H) 1.3(H) 1.30(H)  BUN/Creat Ratio 9 - 23 - - 9  Sodium 135 - 145 mmol/L 139 139 143  Potassium 3.5 - 5.1 mmol/L 3.8 3.6 3.8  Chloride 101 - 111 mmol/L 109 - 99  CO2 22 - 32 mmol/L 21(L) 25 23  Calcium 8.9 - 10.3 mg/dL 9.0 10.0 10.0   Troponin: negative x2  Chest Xray: IMPRESSION: No active cardiopulmonary  disease.  NM Myocar Multi W/Spect W/Wall Motion / EF: Study Result:  - The study is normal. No ischemia or scar - This is a low risk study. - Nuclear stress EF: 68%.  Echocardiogram (January 01, 2017): Study Conclusions: - Left ventricle: The cavity size was normal. There was moderate concentric hypertrophy. Systolic function was vigorous. The estimated ejection fraction was in the range of 65% to 70%. There was dynamic obstruction at rest, with a peak velocity of 291 cm/sec and a peak gradient of 34 mm Hg. Wall motion was normal; there were no regional wall motion abnormalities. Doppler parameters are consistent with abnormal left ventricular relaxation (grade 1 diastolic dysfunction). Doppler parameters are consistent with high ventricular filling pressure. - Aortic valve: Transvalvular velocity was within  the normal range. There was no stenosis. There was no regurgitation. - Mitral valve: Transvalvular velocity was within the normal range. There was no evidence for stenosis. There was no regurgitation. - Right ventricle: The cavity size was normal. Wall thickness was normal. Systolic function was normal. - Atrial septum: No defect or patent foramen ovale was identified by color flow Doppler. - Tricuspid valve: There was trivial regurgitation. - Pulmonary arteries: Systolic pressure was within the normal range. PA peak pressure: 15 mm Hg (S).  Discharge Instructions: Discharge Instructions    Diet - low sodium heart healthy    Complete by:  As directed    Discharge instructions    Complete by:  As directed    Thank you for allowing [place] to care for you  Please follow up with your primary care provider in the next 1 - 2 weeks  Please follow up with cardiology as scheduled  Some of your medications have changed: - STOP taking hydrochlorothiazide and triamterene-hydrochlorothiazide  Your primary care provider will make further adjustments to you medications as needed  Also, please follow  up with your primary care provider and therapist about your possible anxiety and panic attacks as well as strategies to reduce and/or control these feelings   Increase activity slowly    Complete by:  As directed       Signed: Neva Seat, DO IM PGY-1 Pager: [phone]

## 2017-05-07 NOTE — Consult Note (Signed)
Cardiology Consult    Patient ID: Anna Henson MRN: [id number], DOB/AGE: [birth year]   Admit date: 05/06/2017 Date of Consult: 05/07/2017  Primary Physician: Wardell Honour, MD Primary Cardiologist: Dr. Debara Pickett Requesting Provider: Dr. Heber [place]  Reason for Consult: chest pain  Patient Profile    Anna Henson has a PMH significant for CAD s/p PCI to RCA in 2015 and 2018, HTN, HLD, tobacco use, CKD stage II-III, and recent diagnosis of breast cancer.  Anna Henson is a 46 y.o. female who is being seen today for the evaluation of chest pain at the request of Dr. Heber [place].    Past Medical History   Past Medical History:  Diagnosis Date  . Anxiety   . CAD in native artery    a. 09/2014 Cath/PCI in setting of [place] - s/p 2.25 x 12 mm Promus Premier DES to the RCA;  b. 11/2016 Myoview: EF 56%, no ischemia;  c. 12/2016 NSTEMI/PCI: LM nl, LAd 25p, LCX 30p, RCA 100p (2.75x38 Promus Premier DES), mRCA 10 ISR, EF 50-55%.  . Chronic kidney disease    she sthinks has stage 2 CKD, has had US doppler and has some stenosis , this was done in [place]   . CKD (chronic kidney disease), stage II   . Depression   . Genetic testing 04/23/2017   [name] underwent genetic counseling and testing for hereditary cancer syndromes on 04/05/2017. Her results were negative for mutations in all 46 genes analyzed by [facility]'s 46-gene Common Hereditary Cancers Panel. Genes analyzed include: APC, ATM, AXIN2, BARD1, BMPR1A, BRCA1, BRCA2, BRIP1, CDH1, CDKN2A, CHEK2, CTNNA1, DICER1, EPCAM, GREM1, HOXB13, KIT, MEN1, MLH1, MSH2, MSH3, MSH6, MUTYH, NBN,  . Heart attack ([place])   . HOCM (hypertrophic obstructive cardiomyopathy) ([place])    a. 12/2016 Echo: EF 65-70%,  mod conc LVH, dynamic obstruction @ rest, peak velocity of 291 cm/sec w/ peak gradient of 63mHg, no rwma, Gr1 DD, triv TR, PASP 141mg.  . Marland Kitchenyperlipidemia   . Hypertension    > 5 years  . Palpitations   . Tobacco abuse 09/13/2014  . [facility]    Past Surgical History:  Procedure Laterality Date  . CORONARY ANGIOPLASTY WITH STENT PLACEMENT     L main OK, LAD OK, CFX 30%, RCA 90%-0% w/ 2.25 x 12 mm Promus Premier DES, EF 55%  . CORONARY/GRAFT ACUTE MI REVASCULARIZATION N/A 12/31/2016   Procedure: Coronary/Graft Acute MI Revascularization;  Surgeon: MiSherren MochaMD;  [facility];  Service: Cardiovascular;  Laterality: N/A;  . HERNIA REPAIR    . LEFT HEART CATH AND CORONARY ANGIOGRAPHY N/A 12/31/2016   Procedure: Left Heart Cath and Coronary Angiography;  Surgeon: MiSherren MochaMD;  [facility];  Service: Cardiovascular;  Laterality: N/A;  . LEFT HEART CATHETERIZATION WITH CORONARY ANGIOGRAM N/A 09/14/2014   Procedure: LEFT HEART CATHETERIZATION WITH CORONARY ANGIOGRAM;  Surgeon: ChBurnell BlanksMD;  [facility];  Service: Cardiovascular;  Laterality: N/A;  . PERCUTANEOUS CORONARY STENT INTERVENTION (PCI-S)  09/14/2014   Procedure: PERCUTANEOUS CORONARY STENT INTERVENTION (PCI-S);  Surgeon: ChBurnell BlanksMD;  [facility];  Service: Cardiovascular;;  . TONSILLECTOMY  2005   10 years ago     Allergies  No Known Allergies  History of Present Illness    Ms Anna Henson known to our service and last saw Dr. HiDebara Pickettn clinic on 02/23/17 for follow up after her NSTEMI and PCI to RCA in March 2018. At  a previous clinic visit, she c/o tachycardic palpitations. Holtor monitor placed showed normal sinus rhythm without arrhythmias. She was kept on Brilinta and was advised to consider decreasing caffeine intake. Of note, troponins were not elevated in 2015 when she received her first RCA stent. She did not attend cardiac rehab secondary to chronic back problems. She was hypotensive and was taken off of the hydralazine at that clinic visit. She was recently diagnosed with breast cancer (1 month ago). She was switched from brilinta to effient. She has been compliant on all  medications.  On my interview, she states that on Friday, 05/04/17, she thought she was being pulled over by police and she felt a sudden onset of substernal chest pain located in the inferior portion of her sternum. She became short of breath and diaphoretic. The chest pain was sharp in nature and rated as a 8/10. The following day, she noted her BP was elevated in the 120s (sBP had been running in the 90s-100s). On Sunday, her pressure was in the 170s and she was advised by our office to take nitroglycerin. She took 2 nitro SL tabs with decrease in pressure to 140s. She was uncomfortable with this pressure and presented to [facility]. Upon arrival, she stated she continued to have intermittent chest pain that had decreased in intensity but radiated to her left shoulder blade. She agrees that there could be an anxiety component and that this chest pain is not exactly similar to her previous pain during her NSTEMI in March 2018. She received 2 more doses of nitro in the ED without relief.   Troponins x 3 have been negative and EKG is without signs of acute ischemia. She was found to have leukocytosis (14.3) which appears to be chronic for her.  Inpatient Medications    . ARIPiprazole  5 mg Oral Daily  . aspirin  81 mg Oral Daily  . atorvastatin  80 mg Oral Daily  . enoxaparin (LOVENOX) injection  40 mg Subcutaneous Q24H  . gabapentin  600 mg Oral TID  . hydrochlorothiazide  25 mg Oral Daily  . lisinopril  2.5 mg Oral Daily  . metoprolol tartrate  25 mg Oral BID  . pantoprazole  40 mg Oral BID  . prasugrel  10 mg Oral Daily  . tamoxifen  20 mg Oral Daily     Outpatient Medications    Prior to Admission medications   Medication Sig Start Date End Date Taking? Authorizing Provider  ARIPiprazole (ABILIFY) 5 MG tablet Take 1 tablet (5 mg total) by mouth daily. 02/15/17  Yes Arfeen, Arlyce Harman, MD  aspirin 81 MG chewable tablet Chew 1 tablet (81 mg total) by mouth daily. 09/16/14  Yes Nita Sells,  MD  atorvastatin (LIPITOR) 80 MG tablet TAKE 1 TABLET BY MOUTH EVERY DAY 08/25/16  Yes Wendie Agreste, MD  baclofen (LIORESAL) 10 MG tablet Take up to 4/day for muscle spasms 03/12/17  Yes Sater, Nanine Means, MD  fluticasone (FLONASE) 50 MCG/ACT nasal spray SHAKE LIQUID AND USE 2 SPRAYS IN EACH NOSTRIL DAILY Patient taking differently: SHAKE LIQUID AND USE 2 SPRAYS IN EACH NOSTRIL DAILY as need rhinitis 12/04/16  Yes Jeffery, Chelle, PA-C  gabapentin (NEURONTIN) 300 MG capsule Take one pill in the morning, two in the afternoon and two po at bedtime 03/12/17  Yes Sater, Nanine Means, MD  hydrochlorothiazide (HYDRODIURIL) 25 MG tablet Take 1 tablet (25 mg total) by mouth daily. 04/26/17 07/25/17 Yes Hilty, Nadean Corwin, MD  lisinopril (PRINIVIL,ZESTRIL) 2.5  MG tablet Take 1 tablet (2.5 mg total) by mouth daily. 03/15/17  Yes Hilty, Nadean Corwin, MD  metoprolol (LOPRESSOR) 50 MG tablet Take 0.5 tablets (25 mg total) by mouth 2 (two) times daily. TAKE 1 TABLET BY MOUTH TWICE DAILY. 01/10/17  Yes Rogelia Mire, NP  nitroGLYCERIN (NITROSTAT) 0.4 MG SL tablet Place 1 tablet (0.4 mg total) under the tongue every 5 (five) minutes as needed for chest pain. 10/20/16 05/06/17 Yes Strader, Fransisco Hertz, PA-C  pantoprazole (PROTONIX) 40 MG tablet Take 1 tablet (40 mg total) by mouth 2 (two) times daily. 01/02/17  Yes Carlota Raspberry, Tiffany, PA-C  prasugrel (EFFIENT) 10 MG TABS tablet Take 1 tablet (10 mg total) by mouth daily. Take 6 tablets by mouth once and then take 1 tablet daily in the morning. 04/04/17 07/03/17 Yes Hilty, Nadean Corwin, MD  Probiotic Product (PROBIOTIC PO) Take 1 tablet by mouth daily.   Yes [provider]  tamoxifen (NOLVADEX) 20 MG tablet Take 1 tablet (20 mg total) by mouth daily. 04/04/17  Yes Nicholas Lose, MD  traMADol (ULTRAM) 50 MG tablet Take 1 tablet (50 mg total) by mouth 4 (four) times daily as needed. 08/21/16  Yes Sater, Nanine Means, MD  triamterene-hydrochlorothiazide (MAXZIDE-25) 37.5-25 MG tablet  Take 1 tablet by mouth daily.    [provider]     Family History     Family History  Problem Relation Age of Onset  . Diabetes Mother   . Heart disease Mother   . Hyperlipidemia Mother   . Hypertension Mother   . Lung cancer Father   . Drug abuse Father   . Brain cancer Father 21  . Bone cancer Father 46  . Drug abuse Sister   . Mental illness Brother   . Mental illness Maternal Grandmother   . Stomach cancer Paternal Grandmother 57       d.75    Social History    Social History   Social History  . Marital status: Single    Spouse name: N/A  . Number of children: 0  . Years of education: masters   Occupational History  .      [employer]   Social History Main Topics  . Smoking status: Former Smoker    Packs/day: 0.50    Types: Cigarettes    Quit date: 10/05/2015  . Smokeless tobacco: Never Used     Comment: on patch since 09/09/14  . Alcohol use 0.0 oz/week     Comment: occas  . Drug use: No  . Sexual activity: No   Other Topics Concern  . Not on file   Social History Narrative   Consumes 2 cups of caffeine daily     Review of Systems    General:  No chills, fever, night sweats or weight changes.  Cardiovascular:  + chest pain, no dyspnea on exertion, edema, orthopnea, palpitations, paroxysmal nocturnal dyspnea. Dermatological: No rash, lesions/masses Respiratory: No cough, dyspnea Urologic: No hematuria, dysuria Abdominal:   No nausea, vomiting, diarrhea, bright red blood per rectum, melena, or hematemesis Neurologic:  No visual changes, wkns, changes in mental status. All other systems reviewed and are otherwise negative except as noted above.  Physical Exam    Blood pressure 115/71, pulse (!) 58, temperature 98 F (36.7 C), temperature source Oral, resp. rate 14, height _0  (1.575 m), weight 136 lb 14.4 oz (62.1 kg), SpO2 100 %.  General: Pleasant, NAD Psych: Normal affect. Neuro: Alert and oriented X 3. Moves  all extremities  spontaneously. HEENT: Normal  Neck: Supple without bruits or JVD. Lungs:  Resp regular and unlabored, CTA. Heart: RRR no s3, s4, or murmurs. Abdomen: Soft, non-tender, non-distended, BS + x 4.  Extremities: No clubbing, cyanosis or edema. DP/PT/Radials 2+ and equal bilaterally.  Labs    Troponin [facility] of Care Test)  Recent Labs  05/06/17 1149  TROPIPOC 0.00    Recent Labs  05/06/17 1642 05/06/17 2353  TROPONINI <0.03 <0.03   Lab Results  Component Value Date   WBC 14.3 (H) 05/06/2017   HGB 10.1 (L) 05/06/2017   HCT 29.4 (L) 05/06/2017   MCV 85.7 05/06/2017   PLT 356 05/06/2017    Recent Labs Lab 05/06/17 1140  NA 139  K 3.8  CL 109  CO2 21*  BUN 11  CREATININE 1.20*  CALCIUM 9.0  GLUCOSE 104*   Lab Results  Component Value Date   CHOL 139 12/31/2016   HDL 36 (L) 12/31/2016   LDLCALC 83 12/31/2016   TRIG 100 12/31/2016   Lab Results  Component Value Date   DDIMER <0.27 12/19/2016     Radiology Studies    Dg Chest 2 View  Result Date: 05/06/2017 CLINICAL DATA:  Chest pain for 2 days EXAM: CHEST  2 VIEW COMPARISON:  12/31/2016 FINDINGS: Heart and mediastinal contours are within normal limits. No focal opacities or effusions. No acute bony abnormality. IMPRESSION: No active cardiopulmonary disease. Electronically Signed   By: Rolm Baptise M.D.   On: 05/06/2017 12:41   Mr Breast Bilateral W Wo Contrast  Result Date: 04/17/2017 CLINICAL DATA:  Palpable abnormality in the lower outer quadrant of the right breast. Ultrasound-guided core biopsy shows invasive mammary carcinoma. LABS:  None EXAM: BILATERAL BREAST MRI WITH AND WITHOUT CONTRAST TECHNIQUE: Multiplanar, multisequence MR images of both breasts were obtained prior to and following the intravenous administration of 12 ml of MultiHance. THREE-DIMENSIONAL MR IMAGE RENDERING ON INDEPENDENT WORKSTATION: Three-dimensional MR images were rendered by post-processing of the original MR data on an independent  workstation. The three-dimensional MR images were interpreted, and findings are reported in the following complete MRI report for this study. Three dimensional images were evaluated at the independent DynaCad workstation COMPARISON:  Prior studies from [facility] dated 03/26/2017 and earlier FINDINGS: Breast composition: c. Heterogeneous fibroglandular tissue. Background parenchymal enhancement: Moderate. Right breast: Within the lower outer quadrant of the right breast there is a group of irregular masses, associated biopsy clip artifact and arranged in a ductal orientation. The clip is along the anterior aspect of this group of small masses. Measured together these are 1.5 x 4.5 x 1.6 cm. There is rapid plateau type enhancement kinetics. Other foci of enhancement are identified within the right breast, none suspicious for malignancy. Left breast: Within the upper-outer quadrant of the left breast, there are 4 grouped masses, arranged anterior to posterior and superficial in location, measuring 4, 5, 6, and 7 mm in diameter. These demonstrate washout type enhancement kinetics. The most anterior masses have irregular margins. The more posterior masses appear more circumscribed. Within the left axilla, there is an enhancing skin lesion measuring 9 mm and T2 bright, indicating some component of edema and identified on image 28 of series 100. This is favored to represent a benign lesion. Within the lower inner quadrant of the left breast, posterior in location, there is a bilobed septated mass measuring 1.3 x 0.8 x 0.7 cm. This demonstrates slow wash in and persistent type enhancement kinetics and circumscribed  margins, favoring a benign fibroadenoma. This lesion is seen on image 90 of series 6. Lymph nodes: Symmetric bilateral axillary lymph nodes are identified, none suspicious for malignancy. Ancillary findings:  None. IMPRESSION: 1. Additional masses posterior to the known malignancy in lower outer  quadrant of the right breast. Additional image guided biopsy is recommended to determine extent of disease. Ultrasound or MR guided core biopsy is recommended. 2. Suspicious rib of superficial masses in the upper-outer quadrant of the left breast for which biopsy is indicated. Ultrasound or MR guided core biopsy is recommended. 3. Benign appearing mass in the lower inner quadrant of the left breast, posterior in location, favoring fibroadenoma based on enhancement characteristics. 4. Probably benign skin lesion in the left axilla. Correlation with physical exam is recommended. RECOMMENDATION: 1. Ultrasound or MR guided core biopsy of a second mass in the lower outer quadrant of the right breast. 2. Ultrasound or MR guided core biopsy of superficial mass in the upper-outer quadrant of the left breast. BI-RADS CATEGORY  4: Suspicious. Electronically Signed   By: Nolon Nations M.D.   On: 04/17/2017 16:51    ECG & Cardiac Imaging    EKG 05/06/17: sinus bradycardia, ventricular rate 55   Left heart catheterization 12/31/16: 1. Acute total occlusion of the RCA, treated successful with PCI using a 2.75x38 mm Promus DES, deployed in overlapping fashion with the old stent which was widely patent 2. Mild nonobstructive LAD/LCx stenosis 3. Mild segmental LV systolic dysfunction with severe hypokinesis of the basal and mid inferior walls, but vigorous contractility of the anterior and apical walls, LVEF estimated 50-55%  Recommend:  DAPT with ASA and brilinta at least 12 months without interruption  Diagnostic Diagram       Post-Intervention Diagram         NM stress test 11/14/16:  The left ventricular ejection fraction is normal (55-65%).  Nuclear stress EF: 56%.  There was no ST segment deviation noted during stress.  This is a low risk study. No ischemia identified     Left heart catheterization 09/14/14: 1. Unstable angina  2. 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  Successful PTCA/DES x 1 mid RCA 4. Normal LV function  Recommendations: Will continue ASA, Plavix, statin, beta blocker.    Assessment & Plan    1. Chest pain - troponin x 3 negative - EKG without signs of ischemia This chest pain has typical and atypical features. Given that she has negative enzymes and previous acute total occlusion of RCA s/p DES, we will perform NM stress test. Continue ASA and effient, continue statin.    2. HTN - pt has been hypotensive - pressure now controlled - continue home meds of HCTZ, lisinopril, lopressor    3. HLD - continue statin   4. CKD stage II-III - sCr 1.20 - baseline appears to be 0.94-1.30    Signed, Ledora Bottcher, PA-C 05/07/2017, 8:19 AM [phone]  Patient seen and independently examined with Fabian Sharp, PA. We discussed all aspects of the encounter. I agree with the assessment and plan as stated above.  Patient well known to our service with history of PCI of the RCA in 2015 and then presented with NSTEMI in March 2018 with repeat PCI of RCA.  Had been doing well but unfortunately was dx with breast CA a month ago.  Developed CP after getting upset that she might be getting pulled over by the police for running a yellow light and started having sharp  CP.  The pain continued when she went home and waxed and waned in intensity.  This occurred Friday.  Over the weekend she continued to have CP that would wake her up at night and then she would break out in a sweat and get SOB and could not speak.  She noticed that her BP was elevated to the 170's and took SL NTG with improvement in her BP but not her CP.  She presented to ER.    Trop neg x 3 and EKG is nonischemic.  I do not think that this is an ACS.  She had no other significant CAD at the time of her cath and if she closed down her stent she would have a bump in trop.  She is very concerned that something is wrong with her heart and is supposed to be having breast surgery soon.   I suspect that her symptoms are due to anxiety and possible panic attacks but she is in the 6 month window for restenosis.  Exam is benign with clear lungs, heart RRR with no M/R/G and no edema.  Will plan lexiscan myoview today to rule out ischemia.  2D echo in march showed normal LVF with HOCM with peak gradient at rest of 54mHg.  Would stop her diuretic which can drop preload and make dynamic LV obstruction worse in setting of HOCM. HR precludes addition of BB.  Would titrate ACE I as needed for BP control.   Signed: TFransico Him MD [facility] 05/07/2017

## 2017-05-07 NOTE — Progress Notes (Signed)
Patient ID: Anna Henson, female   DOB: 1971-02-01, 46 y.o.   MRN: [id number]   Subjective: Anna Henson was feeling better today. There was concern for her lower heart rate overnight and a dose of metoprolol was held. She continues to have some chest pain but only required 1 dose of pain medication last night and the pain has subsequently remained controlled until this morning. She denies shortness of breath. She endorses nausea and a headache, which was relieved by tylenol. She again report previous episodes of anxiety and feeling chest tightness and as though  Objective:  Vital signs in last 24 hours: Vitals:   05/07/17 1115 05/07/17 1117 05/07/17 1119 05/07/17 1452  BP: (!) 142/66 (!) 170/89 (!) 157/85 122/70  Pulse: (!) 110 (!) 108 89   Resp:      Temp:    98.3 F (36.8 C)  TempSrc:    Oral  SpO2:      Weight:      Height:       Physical Exam  Constitutional: She is oriented to person, place, and time. She appears well-developed and well-nourished. No distress.  HENT:  Head: Normocephalic and atraumatic.  Eyes: EOM are normal. No scleral icterus.  Cardiovascular: Normal rate, regular rhythm and normal heart sounds.  Exam reveals no gallop and no friction rub.   No murmur heard. Pulmonary/Chest: Effort normal and breath sounds normal. No respiratory distress. She has no wheezes. She has no rales.  Abdominal: Soft. Bowel sounds are normal. She exhibits no distension. There is no tenderness.  Musculoskeletal: Normal range of motion. She exhibits no edema or deformity.  Neurological: She is alert and oriented to person, place, and time.  Skin: Skin is warm and dry.   Assessment/Plan:  Chest Pain: Sternal chest pain in the setting of CAD s/p stent x3. Of note, she presented several weeks prior to her previous stenting with negative troponin's and normal EKG. - Repeat EKG showed sinus bradycardia @ 55 BPM - Appreciate cardiology recs - NM stress test - 2mg  IV q2h PRN  Breast  Cancer - Tamoxifen 20mg  Daily  CAD:  s/p stent x2. - ASA 81mg  Daily - Atorvastatin 80mg  Daily - Prasugrel 10mg  Daily - Lisinopril 2.5mg  Daily - Metoprolol tartrate 25mg  BID  Essential Hypertension: In the setting of Echo findings of dynamic obstruction at rest of the left ventricle and HCM. 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   Anna Seat, DO IM PGY-1 Pager: [phone]

## 2017-05-07 NOTE — Progress Notes (Signed)
   Anna Henson presented for a nuclear stress test today.  No immediate complications.  Stress imaging is pending at this time.  Preliminary EKG findings may be listed in the chart, but the stress test result will not be finalized until perfusion imaging is complete.  Linus Mako, PA-C 05/07/2017, 12:32 PM

## 2017-05-07 NOTE — Progress Notes (Signed)
Nuclear stress test showed no ischemia and normal LVF.  Chest pain likely related to panic attacks.  No further cardiac workup recommended at this time.  Will sign off.

## 2017-05-08 ENCOUNTER — Encounter: Payer: Self-pay | Admitting: Pharmacist

## 2017-05-15 ENCOUNTER — Telehealth: Payer: Self-pay | Admitting: Internal Medicine

## 2017-05-15 NOTE — Telephone Encounter (Signed)
New message    Pt c/o BP issue: STAT if pt c/o blurred vision, one-sided weakness or slurred speech  1. What are your last 5 BP readings? 189/89 155/85  2. Are you having any other symptoms (ex. Dizziness, headache, blurred vision, passed out)? Some headache, blurred vision  3. What is your BP issue? Pt is concerned with her BP being high. She said she took 2 nitros this morning

## 2017-05-15 NOTE — Telephone Encounter (Signed)
Spoke with patient and her blood pressure was 189/89 this am. She did take 2 NTG to bring blood pressure down, denied any chest pains. Blood pressure was prior to am medications. Her blood pressure did come down to 155/85 HR 54. Sunday blood pressure 141/89 HR 101 before am medications.  6:10 pm yesterday 156/92. Patient is concerned with blood pressure being all over the place. She was in ED 05/06/17 and medication changes made. Patient is concerned with blood pressure readings. Scheduled appointment to see Dr Debara Pickett tomorrow morning. Patient will bring her blood pressure machine and readings to appointment.  Will forward to Dr Okey Regal for review

## 2017-05-16 ENCOUNTER — Telehealth: Payer: Self-pay | Admitting: Emergency Medicine

## 2017-05-16 ENCOUNTER — Emergency Department (HOSPITAL_COMMUNITY): Payer: Federal, State, Local not specified - PPO

## 2017-05-16 ENCOUNTER — Telehealth: Payer: Self-pay | Admitting: Cardiology

## 2017-05-16 ENCOUNTER — Encounter (HOSPITAL_COMMUNITY): Payer: Self-pay

## 2017-05-16 ENCOUNTER — Ambulatory Visit (INDEPENDENT_AMBULATORY_CARE_PROVIDER_SITE_OTHER): Payer: Federal, State, Local not specified - PPO | Admitting: Internal Medicine

## 2017-05-16 ENCOUNTER — Encounter: Payer: Self-pay | Admitting: Internal Medicine

## 2017-05-16 VITALS — BP 158/80 | HR 60 | Ht 62.0 in | Wt 140.0 lb

## 2017-05-16 DIAGNOSIS — I2510 Atherosclerotic heart disease of native coronary artery without angina pectoris: Secondary | ICD-10-CM | POA: Diagnosis not present

## 2017-05-16 DIAGNOSIS — Z7982 Long term (current) use of aspirin: Secondary | ICD-10-CM | POA: Diagnosis not present

## 2017-05-16 DIAGNOSIS — I10 Essential (primary) hypertension: Secondary | ICD-10-CM

## 2017-05-16 DIAGNOSIS — I129 Hypertensive chronic kidney disease with stage 1 through stage 4 chronic kidney disease, or unspecified chronic kidney disease: Secondary | ICD-10-CM | POA: Insufficient documentation

## 2017-05-16 DIAGNOSIS — R51 Headache: Secondary | ICD-10-CM | POA: Diagnosis present

## 2017-05-16 DIAGNOSIS — Z79899 Other long term (current) drug therapy: Secondary | ICD-10-CM | POA: Insufficient documentation

## 2017-05-16 DIAGNOSIS — Z853 Personal history of malignant neoplasm of breast: Secondary | ICD-10-CM | POA: Insufficient documentation

## 2017-05-16 DIAGNOSIS — N182 Chronic kidney disease, stage 2 (mild): Secondary | ICD-10-CM | POA: Diagnosis not present

## 2017-05-16 DIAGNOSIS — I214 Non-ST elevation (NSTEMI) myocardial infarction: Secondary | ICD-10-CM | POA: Diagnosis not present

## 2017-05-16 DIAGNOSIS — E785 Hyperlipidemia, unspecified: Secondary | ICD-10-CM

## 2017-05-16 LAB — I-STAT TROPONIN, ED: Troponin i, poc: 0.01 ng/mL (ref 0.00–0.08)

## 2017-05-16 LAB — BASIC METABOLIC PANEL WITH GFR
Anion gap: 10 (ref 5–15)
BUN: 7 mg/dL (ref 6–20)
CO2: 21 mmol/L — ABNORMAL LOW (ref 22–32)
Calcium: 9 mg/dL (ref 8.9–10.3)
Chloride: 108 mmol/L (ref 101–111)
Creatinine, Ser: 1.16 mg/dL — ABNORMAL HIGH (ref 0.44–1.00)
GFR calc Af Amer: >60 mL/min
GFR calc non Af Amer: 56 mL/min — ABNORMAL LOW
Glucose, Bld: 98 mg/dL (ref 65–99)
Potassium: 3.3 mmol/L — ABNORMAL LOW (ref 3.5–5.1)
Sodium: 139 mmol/L (ref 135–145)

## 2017-05-16 LAB — CBC
HCT: 28.9 % — ABNORMAL LOW (ref 36.0–46.0)
Hemoglobin: 9.9 g/dL — ABNORMAL LOW (ref 12.0–15.0)
MCH: 29.1 pg (ref 26.0–34.0)
MCHC: 34.3 g/dL (ref 30.0–36.0)
MCV: 85 fL (ref 78.0–100.0)
Platelets: 395 10*3/uL (ref 150–400)
RBC: 3.4 MIL/uL — ABNORMAL LOW (ref 3.87–5.11)
RDW: 14.3 % (ref 11.5–15.5)
WBC: 10.1 10*3/uL (ref 4.0–10.5)

## 2017-05-16 MED ORDER — LISINOPRIL 10 MG PO TABS
10.0000 mg | ORAL_TABLET | Freq: Every day | ORAL | 3 refills | Status: DC
Start: 2017-05-16 — End: 2017-05-17

## 2017-05-16 NOTE — ED Notes (Signed)
Pt came to front desk and asked for IV that EMS placed to be removed. IV removed and intact. Pt wheeled back to waiting area.

## 2017-05-16 NOTE — Patient Instructions (Signed)
Your physician has recommended you make the following change in your medication:  -- INCREASE lisinopril to 10mg  daily at night  Dr. Debara Pickett has requested that you schedule an appointment with one of our clinical pharmacists for a blood pressure check appointment within the next 2-3 weeks.  -- if you monitor your blood pressure (BP) at home, please bring your BP cuff and your BP readings with you to this appointment -- please check your BP no more than twice daily, after you have been sitting/resting for 5-10 minutes, at least 1 hour after taking your BP medications  Your physician recommends that you schedule a follow-up appointment in: TWO MONTHS with Dr. Debara Pickett

## 2017-05-16 NOTE — Telephone Encounter (Signed)
Patient called stating that her BP goes up at night and tonight it 188/141mmHg with a HR of 8mmHg.  Instructed patient to take lisinopril 2.5mg  now (she tooher am dose of Lisinopril yesterday am).  She was instructed to call the office when they open for further instructions

## 2017-05-16 NOTE — Telephone Encounter (Signed)
    Patient called the office concerned about her blood pressure being elevated. She reports trying to take an extra 10mg  Lisinopril but did not have improvement. At the time I had called her, she was already calling EMS to take her to [facility] Emergency department because she a. Lives alone, b. Feels weird, and c. Reports her BP has elevated to 062 systolic.  I advised patient the ER providers will see her at [facility] and we will be contacted on an as needed basis.

## 2017-05-16 NOTE — Progress Notes (Signed)
05/16/2017   PCP: Wardell Honour, MD   Chief Complaint  Patient presents with  . Hypertension    patient reports home BP up in the 180s/100s, HR usually in 50s but up to 125bpm, c/o diaphoresis/headaches/"woozy" when BP elevated; diuretics discontinued at hospital discharge; home BP cuff 167/93 in office today   CC: Elevated blood pressure  HPI:  46 year old Female hx of hypertension, dyslipidemia, chronic tobacco use, chronic kidney disease stage III admitted with hypertensive urgency 09/13/14 and 6/10 chest pain EKG showed lateral T-wave inversions on admission and cardiology was consulted. It was felt she had unstable angina and she was placed on nitro drip.  Cardiac catheterization with 2.25X 12 mm Promus remainder DES to the M RCA on 09/14/14 for 90% stenosis. EF noted 65- 70%.  She did well and discharged without complication.    She was on numerous BP meds and her BP was running in the 90s to 106 and she felt very weak, lethargic.  The hydralazine was stopped and the cardizem.  She is feeling better.  She has occasional brief chest pressure feeling like the discomfort she was admitted with.  She is here today for follow-up. She is actually doing quite well her questions we discussed where the pneumonia vaccine she is only 34 and essentially in good health told her that was not needed unless she wanted it she does not want a flu vaccine she's had side effects in the past and refuses to take it. She is scheduled for sleep study January 29 2 [place] [facility]. She was recently seen by her primary care for cold symptoms and was placed on Zithromax.  She is feeling better.    She is asking about exercise which I recommended she could go back to to start slowly but work only a peripheral 1-2 weeks. She did not have a heart attack and her EF is normal.  Her mother was with her today who also has stents but the mother is or bare-metal stents. We discussed the difference between the 2 and the  need for Plavix with both, and the length of time she would need to take the Plavix.  She is no longer smoking. She is using the NicoDerm.  I saw Ms. Peacock back today in the office for follow-up. She denies any chest pain or worsening shortness of breath. She seems to be stable from a cardiac standpoint after her stent. She does however have an acute complaint. She tells me that about 2 weeks ago she had a viral upper respiratory infection and had a lot of discharge and pus. She has sore throat associated with this. Subsequently she felt a pop on the right side of her head. After that she had pain and numbness on the right side of her face on her right shoulder and down to the right upper arm and right upper back. She's also had difficulty swallowing due to pain particularly in the right side of her throat as well as some mild drooling. She denies any facial droop, right sided weakness, loss of consciousness, change in vision or other associated symptoms. She denies any headache. Blood pressure has been well controlled and is at goal today at 122/76. She was seen in urgent care and treated for acute pansinusitis by Dr. Elder Cyphers and given amoxicillin which she completed. Lab showed no elevated white count, normal renal function, her cholesterol profile was well controlled, and she had a low ESR 15  (which would likely exclude  temporal arteritis).  02/23/2017  Ms. Wilhelmi returns today for follow-up. Unfortunately she suffered a non-ST elevation MI in March 2018. She was found to have total occlusion of the RCA treated with drug-eluting stents. There was some mild nonobstructive disease of the LAD and circumflex and some mild LV systolic dysfunction with EF 50-55%. She reports doing much better since discharge. She denies any recurrent chest pain but does get short of breath and some fatigue with exertion. She started cardiac rehabilitation but found that it worsened her chronic back pain and now she's  discontinued that. She recently saw Ignacia Bayley, NP, in follow-up. She was felt to be doing well although reported having some tachycardia palpitations when awakening from sleep in the middle the night. He was concerned about arrhythmias and placed a monitor. Her monitor showed normal sinus rhythm without any arrhythmias. She since discontinued that. She is on Brilinta but does not report that her shortness of breath seems to be related to the doses. I advised that she can consider some caffeine after the medication to see if it attenuates some of the symptoms.  05/16/2017  Mrs. Krass returns today for follow-up. Recently she's noted that her blood pressures been running much higher although was well controlled in the hospital. She denies any chest pain although has had some mild shortness of breath and occasional sharp chest discomfort. Blood pressure is notably been elevated with systolics in the 350K and diastolics over 938H. Yesterday she was advised to take an additional lisinopril which brought her systolic blood pressure down to the 150s. Typically her blood pressures are elevated more in the evening per her report and in the morning. She was present on a diuretic was taken off of that however due to low blood pressure. Unfortunate, she was recently diagnosed with breast cancer and will need a surgery most likely in September. This will be delayed as she is on dual antiplatelet therapy given her recent stent in March 2018.   Current Outpatient Prescriptions  Medication Sig Dispense Refill  . ARIPiprazole (ABILIFY) 5 MG tablet Take 1 tablet (5 mg total) by mouth daily. 90 tablet 0  . aspirin 81 MG chewable tablet Chew 1 tablet (81 mg total) by mouth daily.    Marland Kitchen atorvastatin (LIPITOR) 80 MG tablet TAKE 1 TABLET BY MOUTH EVERY DAY 30 tablet 0  . baclofen (LIORESAL) 10 MG tablet Take up to 4/day for muscle spasms 120 each 11  . fluticasone (FLONASE) 50 MCG/ACT nasal spray SHAKE LIQUID AND USE 2 SPRAYS  IN EACH NOSTRIL DAILY (Patient taking differently: SHAKE LIQUID AND USE 2 SPRAYS IN EACH NOSTRIL DAILY as need rhinitis) 16 g 5  . gabapentin (NEURONTIN) 300 MG capsule Take one pill in the morning, two in the afternoon and two po at bedtime 150 capsule 11  . lisinopril (PRINIVIL,ZESTRIL) 10 MG tablet Take 1 tablet (10 mg total) by mouth daily. 90 tablet 3  . metoprolol (LOPRESSOR) 50 MG tablet Take 0.5 tablets (25 mg total) by mouth 2 (two) times daily. TAKE 1 TABLET BY MOUTH TWICE DAILY. 90 tablet 1  . pantoprazole (PROTONIX) 40 MG tablet Take 1 tablet (40 mg total) by mouth 2 (two) times daily. 30 tablet 6  . prasugrel (EFFIENT) 10 MG TABS tablet Take 1 tablet (10 mg total) by mouth daily. Take 6 tablets by mouth once and then take 1 tablet daily in the morning. 96 tablet 1  . Probiotic Product (PROBIOTIC PO) Take 1 tablet by mouth daily.    Marland Kitchen  tamoxifen (NOLVADEX) 20 MG tablet Take 1 tablet (20 mg total) by mouth daily. 90 tablet 3  . traMADol (ULTRAM) 50 MG tablet Take 1 tablet (50 mg total) by mouth 4 (four) times daily as needed. 120 tablet 4  . nitroGLYCERIN (NITROSTAT) 0.4 MG SL tablet Place 1 tablet (0.4 mg total) under the tongue every 5 (five) minutes as needed for chest pain. 25 tablet 1   No current facility-administered medications for this visit.    Facility-Administered Medications Ordered in Other Visits  Medication Dose Route Frequency Provider Last Rate Last Dose  . gadopentetate dimeglumine (MAGNEVIST) injection 16 mL  16 mL Intravenous Once PRN Sater, Nanine Means, MD        Past Medical History:  Diagnosis Date  . Anxiety   . Bipolar disorder ([place])   . Breast cancer, right breast ([place]) ~ 03/2017  . CAD in native artery    a. 09/2014 Cath/PCI in setting of [place] - s/p 2.25 x 12 mm Promus Premier DES to the RCA;  b. 11/2016 Myoview: EF 56%, no ischemia;  c. 12/2016 NSTEMI/PCI: LM nl, LAd 25p, LCX 30p, RCA 100p (2.75x38 Promus Premier DES), mRCA 10 ISR, EF 50-55%.  . Chronic  kidney disease    she sthinks has stage 2 CKD, has had US doppler and has some stenosis , this was done in [place]   . CKD (chronic kidney disease), stage II   . Depression   . Genetic testing 04/23/2017   Ms. Digangi underwent genetic counseling and testing for hereditary cancer syndromes on 04/05/2017. Her results were negative for mutations in all 46 genes analyzed by [facility]'s 46-gene Common Hereditary Cancers Panel. Genes analyzed include: APC, ATM, AXIN2, BARD1, BMPR1A, BRCA1, BRCA2, BRIP1, CDH1, CDKN2A, CHEK2, CTNNA1, DICER1, EPCAM, GREM1, HOXB13, KIT, MEN1, MLH1, MSH2, MSH3, MSH6, MUTYH, NBN,  . GERD (gastroesophageal reflux disease)   . Headache    "bi-weekly" (05/07/2017)  . Heart attack ([place]) 12/31/2016  . HOCM (hypertrophic obstructive cardiomyopathy) ([place])    a. 12/2016 Echo: EF 65-70%,  mod conc LVH, dynamic obstruction @ rest, peak velocity of 291 cm/sec w/ peak gradient of 19mHg, no rwma, Gr1 DD, triv TR, PASP 162mg.  . Marland Kitchenyperlipidemia   . Hypertension    > 5 years  . Migraine    "probably 1/month" (05/07/2017)  . Palpitations   . Pneumonia    "twice when I was a child" (05/07/2017)  . Tobacco abuse 09/13/2014  . Transverse myelitis ([place]    Past Surgical History:  Procedure Laterality Date  . ABDOMINAL HERNIA REPAIR  ~ 2001; ~ 2005  . BREAST BIOPSY Right ~ 03/2017 X 2  . CORONARY ANGIOPLASTY WITH STENT PLACEMENT     L main OK, LAD OK, CFX 30%, RCA 90%-0% w/ 2.25 x 12 mm Promus Premier DES, EF 55%  . CORONARY/GRAFT ACUTE MI REVASCULARIZATION N/A 12/31/2016   Procedure: Coronary/Graft Acute MI Revascularization;  Surgeon: MiSherren MochaMD;  Location: [facility];  Service: Cardiovascular;  Laterality: N/A;  . HERNIA REPAIR    . LEFT HEART CATH AND CORONARY ANGIOGRAPHY N/A 12/31/2016   Procedure: Left Heart Cath and Coronary Angiography;  Surgeon: MiSherren MochaMD;  [facility]: [facility];  Service: Cardiovascular;  Laterality: N/A;  . LEFT HEART  CATHETERIZATION WITH CORONARY ANGIOGRAM N/A 09/14/2014   Procedure: LEFT HEART CATHETERIZATION WITH CORONARY ANGIOGRAM;  Surgeon: ChBurnell BlanksMD;  [facility];  Service: Cardiovascular;  Laterality: N/A;  . PERCUTANEOUS CORONARY  STENT INTERVENTION (PCI-S)  09/14/2014   Procedure: PERCUTANEOUS CORONARY STENT INTERVENTION (PCI-S);  Surgeon: Burnell Blanks, MD;  Location: [facility] CATH LAB;  Service: Cardiovascular;;  . TONSILLECTOMY  2005    ROS: Pertinent items noted in HPI and remainder of comprehensive ROS otherwise negative.  Wt Readings from Last 3 Encounters:  05/16/17 140 lb (63.5 kg)  05/07/17 136 lb 14.4 oz (62.1 kg)  04/04/17 137 lb 14.4 oz (62.6 kg)    PHYSICAL EXAM BP (!) 158/80   Pulse 60   Ht '5\' 2"'  (1.575 m)   Wt 140 lb (63.5 kg)   BMI 25.61 kg/m  General appearance: alert and no distress Neck: no carotid bruit and no JVD Lungs: clear to auscultation bilaterally Heart: regular rate and rhythm, S1, S2 normal, no murmur, click, rub or gallop Abdomen: soft, non-tender; bowel sounds normal; no masses,  no organomegaly Extremities: extremities normal, atraumatic, no cyanosis or edema Pulses: 2+ and symmetric Skin: Skin color, texture, turgor normal. No rashes or lesions Neurologic: Grossly normal Psych: pleasant  EKG:  Deferred  ASSESSMENT: 1. Recent NSTEMI - s/p repeat PCI to the RCA 12/2016 - DES 2. Coronary artery disease status post PCI to the RCA in 2015 3. Dyslipidemia-at goal 4. Hypertension-uncontrolled 5. DOE 6. Breast cancer   PLAN: Ms Whisman has had recent uncontrolled hypertension. Previously she was hypotensive and her diuretic therapy was discontinued. She is on low-dose lisinopril and metoprolol. Recommend increasing lisinopril up to 10 mg daily but would dose it in the evening. She should continue on twice daily metoprolol tartrate and record her home blood pressures. Will review those and have her follow-up in hypertension  clinic where he could be further adjusted if necessary. Unfortunately, given her recent stent in March, would not advise coming off of dual antiplatelet therapy prior to September at which time she will likely undergo surgery for breast cancer.  Follow up with me in 6 months.  Pixie Casino, MD, [facility] Attending Cardiologist [place]

## 2017-05-16 NOTE — ED Triage Notes (Signed)
Arrives via EMS with uncontrolled htn. She was seen at PCP today and bp 160/90. PT developed a headache tonight at home, check bp 200/100 and called EMS.   162/97  Hr 58 rr 16  spo2 100% RA  # 18 RAC

## 2017-05-17 ENCOUNTER — Telehealth: Payer: Self-pay | Admitting: Internal Medicine

## 2017-05-17 ENCOUNTER — Ambulatory Visit (HOSPITAL_COMMUNITY): Payer: Self-pay | Admitting: Psychiatry

## 2017-05-17 ENCOUNTER — Emergency Department (HOSPITAL_COMMUNITY)
Admission: EM | Admit: 2017-05-17 | Discharge: 2017-05-17 | Disposition: A | Discharge status: Home / Self Care | Payer: Federal, State, Local not specified - PPO | Attending: Emergency Medicine | Admitting: Emergency Medicine

## 2017-05-17 DIAGNOSIS — I10 Essential (primary) hypertension: Secondary | ICD-10-CM

## 2017-05-17 LAB — URINALYSIS, ROUTINE W REFLEX MICROSCOPIC
Bilirubin Urine: NEGATIVE
Glucose, UA: NEGATIVE mg/dL
Hgb urine dipstick: NEGATIVE
Ketones, ur: 5 mg/dL — AB
Leukocytes, UA: NEGATIVE
Nitrite: NEGATIVE
Protein, ur: 30 mg/dL — AB
Specific Gravity, Urine: 1.017 (ref 1.005–1.030)
pH: 5 (ref 5.0–8.0)

## 2017-05-17 MED ORDER — LISINOPRIL 10 MG PO TABS: ORAL_TABLET | ORAL | 6 refills | Status: DC

## 2017-05-17 NOTE — Telephone Encounter (Signed)
She may need more medicine - she could increase the dose of lisinopril to 10 mg twice daily.  Dr. Lemmie Evens

## 2017-05-17 NOTE — ED Provider Notes (Signed)
[facility] DEPT Provider Note   CSN: [id number] Arrival date & time: 05/16/17  2015     History   Chief Complaint Chief Complaint  Patient presents with  . Hypertension    HPI Anna Henson is a 46 y.o. female.  HPI Patient was seen by her primary care doctor data blood pressure was 160/90 in the office and she was told to increase her lisinopril.  She went home and developed a mild headache and did not feel well.  She checked her own blood pressure and found to be in the 200s.  She contacted EMS for evaluation and was brought to the emergency department.  For EMS her blood pressure was 162/97.  She denies weakness of her arms or legs.  She reports still mild ongoing headache at this time.  No change in her vision.  She states s with her medication and is good about the diet she chooses.  She is most concerned about her blood pressure because she has a schedule lumpectomy coming up and she is afraid that her blood pressure will be high on day of surgery and she will be canceled   Past Medical History:  Diagnosis Date  . Anxiety   . Bipolar disorder ([place])   . Breast cancer, right breast ([place]) ~ 03/2017  . CAD in native artery    a. 09/2014 Cath/PCI in setting of [place] - s/p 2.25 x 12 mm Promus Premier DES to the RCA;  b. 11/2016 Myoview: EF 56%, no ischemia;  c. 12/2016 NSTEMI/PCI: LM nl, LAd 25p, LCX 30p, RCA 100p (2.75x38 Promus Premier DES), mRCA 10 ISR, EF 50-55%.  . Chronic kidney disease    she sthinks has stage 2 CKD, has had US doppler and has some stenosis , this was done in [place]   . CKD (chronic kidney disease), stage II   . Depression   . Genetic testing 04/23/2017   Ms. Puett underwent genetic counseling and testing for hereditary cancer syndromes on 04/05/2017. Her results were negative for mutations in all 46 genes analyzed by [employer]'s 46-gene Common Hereditary Cancers Panel. Genes analyzed include: APC, ATM, AXIN2, BARD1, BMPR1A, BRCA1, BRCA2, BRIP1, CDH1, CDKN2A,  CHEK2, CTNNA1, DICER1, EPCAM, GREM1, HOXB13, KIT, MEN1, MLH1, MSH2, MSH3, MSH6, MUTYH, NBN,  . GERD (gastroesophageal reflux disease)   . Headache    "bi-weekly" (05/07/2017)  . Heart attack ([place]) 12/31/2016  . HOCM (hypertrophic obstructive cardiomyopathy) ([place])    a. 12/2016 Echo: EF 65-70%,  mod conc LVH, dynamic obstruction @ rest, peak velocity of 291 cm/sec w/ peak gradient of 60mHg, no rwma, Gr1 DD, triv TR, PASP 19mg.  . Marland Kitchenyperlipidemia   . Hypertension    > 5 years  . Migraine    "probably 1/month" (05/07/2017)  . Palpitations   . Pneumonia    "twice when I was a child" (05/07/2017)  . Tobacco abuse 09/13/2014  . 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 HOCM (hypertrophic obstructive cardiomyopathy) ([facility]  . Chest pain 05/06/2017  . Genetic testing 04/23/2017  . Malignant neoplasm of lower-outer quadrant of right breast of female, estrogen receptor positive ([facility]eredo06/21/2018  . Dysesthesia 03/12/2017  . Palpitations 02/23/2017  . S/P cardiac cath (12/2016) a. acute total occlusion of the RCA tx w/PCI DES, b. mild nonobs LAD/LCx stenosis, c. mild segmental LV sys dx w/ sev hypokinesis, LVEF 50-55% 01/02/2017  . Non-ST elevation myocardial infarction (NSTEMI), subsequent episode of care (  [place]) 12/31/2016  . Malignant hypertension   . Chronic kidney disease   . ST elevation myocardial infarction involving right coronary artery ([place])   . Abnormal EKG   . Nausea   . Ischemic cardiomyopathy   . Muscle spasms of both lower extremities 04/17/2016  . Gait disturbance 01/25/2016  . 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 Acute-on-chronic kidney injury ([place]) 11/12/2015  . Neck pain 11/12/2015  . Hypokalemia 11/12/2015  . Abnormal MRI, neck 11/12/2015  . Numbness 11/12/2015  . Hypertension   . Hyperlipidemia   . Coronary artery disease involving native coronary artery of native heart without angina pectoris   . Facial numbness   .  Right arm numbness   . Throat pain 11/09/2015  . Drooling 11/09/2015  . CAD (coronary artery disease), native coronary artery 09/15/2014  . Headache 09/15/2014  . UTI (urinary tract infection): Probable 09/14/2014  . Candida infection of genital region 09/14/2014  . Unstable angina ([place]) 09/13/2014  . HLD (hyperlipidemia) 09/13/2014  . Otitis 09/13/2014  . ACS (acute coronary syndrome) ([place]) 09/13/2014  . Tobacco abuse 09/13/2014  . Essential hypertension 09/09/2014    Past Surgical History:  Procedure Laterality Date  . ABDOMINAL HERNIA REPAIR  ~ 2001; ~ 2005  . BREAST BIOPSY Right ~ 03/2017 X 2  . CORONARY ANGIOPLASTY WITH STENT PLACEMENT     L main OK, LAD OK, CFX 30%, RCA 90%-0% w/ 2.25 x 12 mm Promus Premier DES, EF 55%  . CORONARY/GRAFT ACUTE MI REVASCULARIZATION N/A 12/31/2016   Procedure: Coronary/Graft Acute MI Revascularization;  Surgeon: Sherren Mocha, MD;  Location: [facility];  Service: Cardiovascular;  Laterality: N/A;  . HERNIA REPAIR    . LEFT HEART CATH AND CORONARY ANGIOGRAPHY N/A 12/31/2016   Procedure: Left Heart Cath and Coronary Angiography;  Surgeon: Sherren Mocha, MD;  Location: [facility];  Service: Cardiovascular;  Laterality: N/A;  . LEFT HEART CATHETERIZATION WITH CORONARY ANGIOGRAM N/A 09/14/2014   Procedure: LEFT HEART CATHETERIZATION WITH CORONARY ANGIOGRAM;  Surgeon: Burnell Blanks, MD;  Location: [facility] CATH LAB;  Service: Cardiovascular;  Laterality: N/A;  . PERCUTANEOUS CORONARY STENT INTERVENTION (PCI-S)  09/14/2014   Procedure: PERCUTANEOUS CORONARY STENT INTERVENTION (PCI-S);  Surgeon: Burnell Blanks, MD;  Location[facility];  Service: Cardiovascular;;  . TONSILLECTOMY  2005    OB History    No data available       Home Medications    Prior to Admission medications   Medication Sig Start Date End Date Taking? Authorizing Provider  ARIPiprazole (ABILIFY) 5 MG tablet Take 1 tablet (5 mg total) by mouth daily.  02/15/17  Yes Arfeen, Arlyce Harman, MD  aspirin 81 MG chewable tablet Chew 1 tablet (81 mg total) by mouth daily. 09/16/14  Yes Nita Sells, MD  atorvastatin (LIPITOR) 80 MG tablet TAKE 1 TABLET BY MOUTH EVERY DAY 08/25/16  Yes Wendie Agreste, MD  baclofen (LIORESAL) 10 MG tablet Take up to 4/day for muscle spasms Patient taking differently: Take 5 mg by mouth 4 (four) times daily as needed for muscle spasms.  03/12/17  Yes Sater, Nanine Means, MD  fluticasone (FLONASE) 50 MCG/ACT nasal spray SHAKE LIQUID AND USE 2 SPRAYS IN EACH NOSTRIL DAILY Patient taking differently: SHAKE LIQUID AND USE 2 SPRAYS IN EACH NOSTRIL DAILY as need rhinitis 12/04/16  Yes Jeffery, Chelle, PA-C  gabapentin (NEURONTIN) 300 MG capsule Take one pill in the morning, two in the afternoon and two po at bedtime Patient taking differently:  Take 300-600 mg by mouth See admin instructions. Take 1 capsule every morning then take 2 capsules in the afternoon and at bedtime 03/12/17  Yes Sater, Nanine Means, MD  lisinopril (PRINIVIL,ZESTRIL) 10 MG tablet Take 1 tablet (10 mg total) by mouth daily. 05/16/17  Yes Hilty, Nadean Corwin, MD  metoprolol (LOPRESSOR) 50 MG tablet Take 0.5 tablets (25 mg total) by mouth 2 (two) times daily. TAKE 1 TABLET BY MOUTH TWICE DAILY. Patient taking differently: Take 25 mg by mouth 2 (two) times daily.  01/10/17  Yes Rogelia Mire, NP  nitroGLYCERIN (NITROSTAT) 0.4 MG SL tablet Place 1 tablet (0.4 mg total) under the tongue every 5 (five) minutes as needed for chest pain. 10/20/16 05/17/17 Yes Strader, Fransisco Hertz, PA-C  pantoprazole (PROTONIX) 40 MG tablet Take 1 tablet (40 mg total) by mouth 2 (two) times daily. 01/02/17  Yes Carlota Raspberry, Tiffany, PA-C  prasugrel (EFFIENT) 10 MG TABS tablet Take 1 tablet (10 mg total) by mouth daily. Take 6 tablets by mouth once and then take 1 tablet daily in the morning. Patient taking differently: Take 10 mg by mouth daily.  04/04/17 07/03/17 Yes Hilty, Nadean Corwin, MD  Probiotic  Product (PROBIOTIC PO) Take 1 tablet by mouth daily.   Yes [provider]  tamoxifen (NOLVADEX) 20 MG tablet Take 1 tablet (20 mg total) by mouth daily. 04/04/17  Yes Nicholas Lose, MD  traMADol (ULTRAM) 50 MG tablet Take 1 tablet (50 mg total) by mouth 4 (four) times daily as needed. 08/21/16  Yes Sater, Nanine Means, MD    Family History Family History  Problem Relation Age of Onset  . Diabetes Mother   . Heart disease Mother   . Hyperlipidemia Mother   . Hypertension Mother   . Lung cancer Father   . Drug abuse Father   . Brain cancer Father 87  . Bone cancer Father 87  . Drug abuse Sister   . Mental illness Brother   . Mental illness Maternal Grandmother   . Stomach cancer Paternal Grandmother 48       d.75    Social History Social History  Substance Use Topics  . Smoking status: Former Smoker    Packs/day: 0.50    Years: 30.00    Types: Cigarettes    Quit date: 10/05/2015  . Smokeless tobacco: Never Used  . Alcohol use 1.2 oz/week    1 Cans of beer, 1 Shots of liquor per week     Allergies   Patient has no known allergies.   Review of Systems Review of Systems  All other systems reviewed and are negative.    Physical Exam Updated Vital Signs BP (!) 148/89   Pulse 74   Temp 97.8 F (36.6 C) (Oral)   Resp 18   Ht 5' 2" (1.575 m)   Wt 63.5 kg (140 lb)   LMP 05/04/2017 (Approximate)   SpO2 100%   BMI 25.61 kg/m   Physical Exam  Constitutional: She is oriented to person, place, and time. She appears well-developed and well-nourished. No distress.  HENT:  Head: Normocephalic and atraumatic.  Eyes: EOM are normal.  Neck: Normal range of motion.  Cardiovascular: Normal rate, regular rhythm and normal heart sounds.   Pulmonary/Chest: Effort normal and breath sounds normal.  Abdominal: Soft. She exhibits no distension. There is no tenderness.  Musculoskeletal: Normal range of motion.  Neurological: She is alert and oriented to person, place, and  time.  Skin: Skin is warm and dry.  Psychiatric: She has a normal mood and affect. Judgment normal.  Nursing note and vitals reviewed.    ED Treatments / Results  Labs (all labs ordered are listed, but only abnormal results are displayed) Labs Reviewed  BASIC METABOLIC PANEL - Abnormal; Notable for the following:       Result Value   Potassium 3.3 (*)    CO2 21 (*)    Creatinine, Ser 1.16 (*)    GFR calc non Af Amer 56 (*)    All other components within normal limits  CBC - Abnormal; Notable for the following:    RBC 3.40 (*)    Hemoglobin 9.9 (*)    HCT 28.9 (*)    All other components within normal limits  URINALYSIS, ROUTINE W REFLEX MICROSCOPIC - Abnormal; Notable for the following:    Ketones, ur 5 (*)    Protein, ur 30 (*)    Bacteria, UA FEW (*)    Squamous Epithelial / LPF 0-5 (*)    All other components within normal limits  I-STAT TROPONIN, ED    EKG  EKG Interpretation  Date/Time:  Wednesday May 16 2017 20:22:25 EDT Ventricular Rate:  57 PR Interval:  122 QRS Duration: 66 QT Interval:  448 QTC Calculation: 436 R Axis:   113 Text Interpretation:  Sinus bradycardia Low voltage QRS Lateral infarct , age undetermined Abnormal ECG No significant change was found Confirmed by Jola Schmidt [phone]) on 05/17/2017 4:49:59 AM       Radiology Dg Chest 2 View  Result Date: 05/16/2017 CLINICAL DATA:  Acute onset of lightheadedness and hot flashes. High blood pressure. Initial encounter. EXAM: CHEST  2 VIEW COMPARISON:  Chest radiograph performed 05/06/2017 FINDINGS: The lungs are well-aerated and clear. There is no evidence of focal opacification, pleural effusion or pneumothorax. The heart is normal in size; the mediastinal contour is within normal limits. No acute osseous abnormalities are seen. IMPRESSION: No acute cardiopulmonary process seen. Electronically Signed   By: Garald Balding M.D.   On: 05/16/2017 21:20    Procedures Procedures (including critical  care time)  Medications Ordered in ED Medications - No data to display   Initial Impression / Assessment and Plan / ED Course  I have reviewed the triage vital signs and the nursing notes.  Pertinent labs & imaging results that were available during my care of the patient were reviewed by me and considered in my medical decision making (see chart for details).     Patient's blood pressures normalized while in the emergency department without treatment.  Blood pressure at time of discharge is 148/89.  I recommended close follow-up with her primary care physician.  She'll check her blood pressure only twice a day and record this in the log.  I stressed that she is not to check her blood pressure too often as I think this can be somewhat detrimental toperceived blood pressure control as well.  Final Clinical Impressions(s) / ED Diagnoses   Final diagnoses:  Hypertension, unspecified type    New Prescriptions New Prescriptions   No medications on file     Jola Schmidt, MD 05/17/17 [phone]

## 2017-05-17 NOTE — Telephone Encounter (Signed)
Returned call to patient.She stated she wanted Dr.Hilty to know after her visit with him yesterday she went to [facility] ED for elevated B/P 190/100,204/100.Stated she had a bad headache,tingling all over.Stated she was not happy with visit.She wanted to ask Dr.Hilty what she needs to do when B/P 200/100.Message sent to Dr.Hilty for advice.

## 2017-05-17 NOTE — Telephone Encounter (Signed)
Follow  Up Call: Anna Henson is calling because her Blood pressure is 192/112 and she took 10 mg Lisinopril .

## 2017-05-17 NOTE — Telephone Encounter (Signed)
Returned call to patient Dr.Hilty's recommendations given.Advised to continue to monitor B/P and call back if B/P continues to be elevated.

## 2017-05-17 NOTE — Telephone Encounter (Signed)
New Message   Pt states the doctor at the ER told her not to take her blood pressure pills unless she has a headache or she feels dizzy. She states she does not agree with this statement and wants clarification on this. She was unhappy with what she was told by the Dr at the ER. Requests a call back.

## 2017-05-17 NOTE — Telephone Encounter (Signed)
Tried to call-phone call will not go thru

## 2017-05-21 ENCOUNTER — Ambulatory Visit (HOSPITAL_COMMUNITY): Payer: Federal, State, Local not specified - PPO | Admitting: Psychiatry
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
## 2017-05-23 ENCOUNTER — Encounter: Payer: Self-pay | Admitting: General Practice

## 2017-05-23 NOTE — Progress Notes (Signed)
[facility] Spiritual Care Note  Reached Ms Dayrit briefly by phone.  Per pt, she is doing well over all and is working with a therapist who has encouraged her to utilize [employer] resources and programming as well.  We plan to f/u by phone at a more convenient time.   [place], [place], [facility] Pager [phone] Voicemail [phone]

## 2017-05-24 ENCOUNTER — Encounter (HOSPITAL_COMMUNITY): Payer: Self-pay | Admitting: Psychiatry

## 2017-05-24 ENCOUNTER — Ambulatory Visit (INDEPENDENT_AMBULATORY_CARE_PROVIDER_SITE_OTHER): Payer: Federal, State, Local not specified - PPO | Admitting: Psychiatry

## 2017-05-24 VITALS — BP 128/76 | HR 75 | Ht 62.0 in | Wt 142.6 lb

## 2017-05-24 DIAGNOSIS — Z813 Family history of other psychoactive substance abuse and dependence: Secondary | ICD-10-CM | POA: Diagnosis not present

## 2017-05-24 DIAGNOSIS — Z818 Family history of other mental and behavioral disorders: Secondary | ICD-10-CM

## 2017-05-24 DIAGNOSIS — M2550 Pain in unspecified joint: Secondary | ICD-10-CM

## 2017-05-24 DIAGNOSIS — Z563 Stressful work schedule: Secondary | ICD-10-CM

## 2017-05-24 DIAGNOSIS — C50911 Malignant neoplasm of unspecified site of right female breast: Secondary | ICD-10-CM | POA: Diagnosis not present

## 2017-05-24 DIAGNOSIS — F4310 Post-traumatic stress disorder, unspecified: Secondary | ICD-10-CM

## 2017-05-24 DIAGNOSIS — Z87891 Personal history of nicotine dependence: Secondary | ICD-10-CM

## 2017-05-24 DIAGNOSIS — F319 Bipolar disorder, unspecified: Secondary | ICD-10-CM

## 2017-05-24 DIAGNOSIS — M549 Dorsalgia, unspecified: Secondary | ICD-10-CM

## 2017-05-24 DIAGNOSIS — G4700 Insomnia, unspecified: Secondary | ICD-10-CM

## 2017-05-24 DIAGNOSIS — F419 Anxiety disorder, unspecified: Secondary | ICD-10-CM

## 2017-05-24 MED ORDER — HYDROXYZINE PAMOATE 25 MG PO CAPS: ORAL_CAPSULE | ORAL | 1 refills | Status: DC

## 2017-05-24 MED ORDER — ARIPIPRAZOLE 5 MG PO TABS: 5.0000 mg | ORAL_TABLET | Freq: Every day | ORAL | 0 refills | Status: DC
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
## 2017-05-25 ENCOUNTER — Encounter: Payer: Self-pay | Admitting: General Practice

## 2017-05-25 NOTE — Progress Notes (Signed)
[place] Spiritual Care Note  Followed up with Prentiss Bells by phone for further support.  She is feeling high distress at having to wait until October for surgery due to hx heart problems; per pt, her biggest desire is to "get it over with and move on."  After a friend's troubling reaction to her sharing her diagnosis, Skylyn is very wary of telling anyone else.  She knows that this compounds her isolation at a time when she could use support, but, per pt, she "just can't handle anybody else's feelings about what I already have a hard time talking about."  She perceives a high level of social stigma related to a cancer diagnosis, which creates a cycle of shame and self-identifying as being "not a normal person" right now.  She craves normalcy and emotional relief from the burden of cancer-related isolation and anxiety.  Jin also has plans that she is looking forward to this morning and is intermittently trying to find joy and enjoyment in everyday life "while waiting [for surgery/normalcy/stress relief."  Provided empathic listening, normalization of feelings and reframing to offer a shift in perspective, opportunity for her to try out words she might use when sharing about her diagnosis, and encouragement to practice self-care and to focus on present moment as much as she can.  We plan to f/u by phone next week.   [place], [place], [facility] Pager [phone] Voicemail [phone]

## 2017-05-28 ENCOUNTER — Telehealth: Payer: Self-pay | Admitting: Internal Medicine

## 2017-05-28 DIAGNOSIS — E785 Hyperlipidemia, unspecified: Secondary | ICD-10-CM

## 2017-05-28 DIAGNOSIS — I10 Essential (primary) hypertension: Secondary | ICD-10-CM

## 2017-05-28 MED ORDER — FUROSEMIDE 20 MG PO TABS: 20.0000 mg | ORAL_TABLET | Freq: Every day | ORAL | 0 refills | Status: DC | PRN

## 2017-05-28 MED ORDER — LISINOPRIL 10 MG PO TABS: ORAL_TABLET | ORAL | 0 refills | Status: DC

## 2017-05-28 NOTE — Telephone Encounter (Signed)
Spoke with pt, she is current in the oncology office, her bp earlier today was 184/100 and she took an extra lisinopril and that is why her bp is 170/90 now. She reports her bp has been running high all week last week. She also reports dizziness when standing up to walk. She reports that over the weekend her feet were so swollen that she could not get her shoes on. She took a 25 mg HCTZ and the swelling is currently gone. It did not help her bp come down. She is currently taking lisinopril 10 mg bid and 25 mg of metoprolol bid. She has an appointment in the HTN clinic Thursday this week. Will forward to pharm MD to see if there is anything they recommend prior to appointment.

## 2017-05-28 NOTE — Telephone Encounter (Signed)
New message   Pt c/o BP issue: STAT if pt c/o blurred vision, one-sided weakness or slurred speech  1. What are your last 5 BP readings? 170/90  2. Are you having any other symptoms (ex. Dizziness, headache, blurred vision, passed out)? dizziness  3. What is your BP issue? Running high

## 2017-05-28 NOTE — Telephone Encounter (Signed)
Patient to increase lisinopril from 20mg  daily (10mg  bid) to 30mg  daily (20mg  every morning and 10mg  every evening).   Order for furosemide 20mg  daily as needed for fluid retention sent to prefer pharmacy. Only 10 tables given until f/u HTN visit and repeat BMET.

## 2017-05-29 ENCOUNTER — Ambulatory Visit: Payer: Self-pay | Admitting: General Surgery

## 2017-05-29 DIAGNOSIS — C50511 Malignant neoplasm of lower-outer quadrant of right female breast: Secondary | ICD-10-CM

## 2017-05-29 DIAGNOSIS — Z170 Estrogen receptor positive status [ER+]: Principal | ICD-10-CM

## 2017-05-31 ENCOUNTER — Ambulatory Visit (INDEPENDENT_AMBULATORY_CARE_PROVIDER_SITE_OTHER): Payer: Federal, State, Local not specified - PPO | Admitting: Pharmacist

## 2017-05-31 VITALS — BP 118/62 | HR 64 | Wt 135.0 lb

## 2017-05-31 DIAGNOSIS — I10 Essential (primary) hypertension: Secondary | ICD-10-CM | POA: Diagnosis not present

## 2017-05-31 MED ORDER — LISINOPRIL 10 MG PO TABS: ORAL_TABLET | ORAL | 0 refills | Status: DC

## 2017-05-31 NOTE — Patient Instructions (Addendum)
Return for a  follow up appointment in 4 weeks  Your blood pressure today is 118/62 pulse 64  Check your blood pressure at home daily (if able) and keep record of the readings.  Take your BP meds as follows: *ALL medication as prescribed*  Bring all of your meds, your BP cuff and your record of home blood pressures to your next appointment.  Exercise as you're able, try to walk approximately 30 minutes per day.  Keep salt intake to a minimum, especially watch canned and prepared boxed foods.  Eat more fresh fruits and vegetables and fewer canned items.  Avoid eating in fast food restaurants.    HOW TO TAKE YOUR BLOOD PRESSURE: . Rest 5 minutes before taking your blood pressure. .  Don't smoke or drink caffeinated beverages for at least 30 minutes before. . Take your blood pressure before (not after) you eat. . Sit comfortably with your back supported and both feet on the floor (don't cross your legs). . Elevate your arm to heart level on a table or a desk. . Use the proper sized cuff. It should fit smoothly and snugly around your bare upper arm. There should be enough room to slip a fingertip under the cuff. The bottom edge of the cuff should be 1 inch above the crease of the elbow. . Ideally, take 3 measurements at one sitting and record the average.

## 2017-05-31 NOTE — Progress Notes (Signed)
Patient ID: Anna Henson                 DOB: 1971/04/02                      MRN: [id number]     HPI: Anna Henson is a 46 y.o. female referred by Dr. Debara Pickett to HTN clinic. PMH includes NSTEMI  s/p PCI in 12/2016,  CAD s/p stent placement in 09/2014, CKD stage 2, hyperlipidemia, hypertension,  bipolar disorder and newly diagnosed breast cancer.  Patient recently had multiple days of elevated BP readings due initiation of tamoxifen. Lisinopril dose increased from 66m twice daily to 20 mg every morning and 132mevery evening on 05/28/2017.  Improvement in BP readings noted in most recent days since dose adjustment. Patient denies dizziness, chest pain, headaches or blurry vision.  Current HTN meds:  Lisinopril 2061mvery morning and 81m63mery evening Furosemide 20mg31mly PRN edema Metoprolol 25mg 36me daily  Previously tried:  Triamterene.HCTZ 37.5/25mg daily  BP goal: <130/80  Family History: all siblings with hypertension;   Diet: currently avoiding all high sodium foods  Home BP readings: 26 readings; average 142/88 (pulse 66-146 bpm)  Wt Readings from Last 3 Encounters:  05/31/17 135 lb (61.2 kg)  05/16/17 140 lb (63.5 kg)  05/16/17 140 lb (63.5 kg)   BP Readings from Last 3 Encounters:  05/31/17 118/62  05/17/17 (!) 148/89  05/16/17 (!) 158/80   Pulse Readings from Last 3 Encounters:  05/31/17 64  05/17/17 74  05/16/17 60    Renal function: Estimated Creatinine Clearance: 53.6 mL/min (A) (by C-G formula based on SCr of 1.14 mg/dL (H)).  Past Medical History:  Diagnosis Date  . Anxiety   . Bipolar disorder (HCC)  [place] breast (HCC) ~[place]  . CAD in native artery    a. 09/2014 Cath/PCI in setting of [place] 2.25 x 12 mm Promus Premier DES to the RCA;  b. 11/2016 Myoview: EF 56%, no ischemia;  c. 12/2016 NSTEMI/PCI: LM nl, LAd 25p, LCX 30p, RCA 100p (2.75x38 Promus Premier DES), mRCA 10 ISR, EF 50-55%.  . Chronic kidney disease    she sthinks  has stage 2 CKD, has [place] and has some [place] , [place] stage II   . Depression   . Genetic testing 04/23/2017   Ms. Zellner underwent genetic counseling and testing for hereditary cancer syndromes on 04/05/2017. Her results were negative for mutations in all 46 genes analyzed by [facility]'s 46-gene Common Hereditary Cancers Panel. Genes analyzed include: APC, ATM, AXIN2, BARD1, BMPR1A, BRCA1, BRCA2, BRIP1, CDH1, CDKN2A, CHEK2, CTNNA1, DICER1, EPCAM, GREM1, HOXB13, KIT, MEN1, MLH1, MSH2, MSH3, MSH6, MUTYH, NBN,  . GERD (gastroesophageal reflux disease)   . Headache    "bi-weekly" (05/07/2017)  . Heart attack (HCC) [place]5/2018  . HOCM (hypertrophic obstructive cardiomyopathy) (HCC)  [place]. 12/2016 Echo: EF 65-70%,  mod conc LVH, dynamic obstruction @ rest, peak velocity of 291 cm/sec w/ peak gradient of 34mmHg18m rwma, Gr1 DD, triv TR, PASP 15mmHg.36mHyperlMarland Kitchenpidemia   . Hypertension    > 5 years  . Migraine    "probably 1/month" (05/07/2017)  . Palpitations   . Pneumonia    "twice when I was a child" (05/07/2017)  . Tobacco abuse 09/13/2014  . Transverse myelitis (HCC) [place]  Current Outpatient Prescriptions on File  Prior to Visit  Medication Sig Dispense Refill  . ARIPiprazole (ABILIFY) 5 MG tablet Take 1 tablet (5 mg total) by mouth daily. 90 tablet 0  . aspirin 81 MG chewable tablet Chew 1 tablet (81 mg total) by mouth daily.    Marland Kitchen atorvastatin (LIPITOR) 80 MG tablet TAKE 1 TABLET BY MOUTH EVERY DAY 30 tablet 0  . baclofen (LIORESAL) 10 MG tablet Take up to 4/day for muscle spasms (Patient taking differently: Take 5 mg by mouth 4 (four) times daily as needed for muscle spasms. ) 120 each 11  . fluticasone (FLONASE) 50 MCG/ACT nasal spray SHAKE LIQUID AND USE 2 SPRAYS IN EACH NOSTRIL DAILY (Patient taking differently: SHAKE LIQUID AND USE 2 SPRAYS IN EACH NOSTRIL DAILY as need rhinitis) 16 g 5  . gabapentin (NEURONTIN) 300 MG  capsule Take one pill in the morning, two in the afternoon and two po at bedtime (Patient taking differently: Take 300-600 mg by mouth See admin instructions. Take 1 capsule every morning then take 2 capsules in the afternoon and at bedtime) 150 capsule 11  . hydrOXYzine (VISTARIL) 25 MG capsule Take 1-2 capsule at bed time for anxiety and insomnia 60 capsule 1  . metoprolol (LOPRESSOR) 50 MG tablet Take 0.5 tablets (25 mg total) by mouth 2 (two) times daily. TAKE 1 TABLET BY MOUTH TWICE DAILY. (Patient taking differently: Take 25 mg by mouth 2 (two) times daily. ) 90 tablet 1  . prasugrel (EFFIENT) 10 MG TABS tablet Take 1 tablet (10 mg total) by mouth daily. Take 6 tablets by mouth once and then take 1 tablet daily in the morning. (Patient taking differently: Take 10 mg by mouth daily. ) 96 tablet 1  . Probiotic Product (PROBIOTIC PO) Take 1 tablet by mouth daily.    . tamoxifen (NOLVADEX) 20 MG tablet Take 1 tablet (20 mg total) by mouth daily. 90 tablet 3  . traMADol (ULTRAM) 50 MG tablet Take 1 tablet (50 mg total) by mouth 4 (four) times daily as needed. 120 tablet 4  . furosemide (LASIX) 20 MG tablet TAKE 1 TABLET(20 MG) BY MOUTH DAILY AS NEEDED (Patient not taking: Reported on 05/31/2017) 10 tablet 0  . nitroGLYCERIN (NITROSTAT) 0.4 MG SL tablet Place 1 tablet (0.4 mg total) under the tongue every 5 (five) minutes as needed for chest pain. 25 tablet 1  . pantoprazole (PROTONIX) 40 MG tablet Take 1 tablet (40 mg total) by mouth 2 (two) times daily. (Patient not taking: Reported on 05/31/2017) 30 tablet 6   Current Facility-Administered Medications on File Prior to Visit  Medication Dose Route Frequency Provider Last Rate Last Dose  . gadopentetate dimeglumine (MAGNEVIST) injection 16 mL  16 mL Intravenous Once PRN Sater, Nanine Means, MD        No Known Allergies  Blood pressure 118/62, pulse 64, weight 135 lb (61.2 kg), last menstrual period 05/04/2017.  Essential hypertension Blood pressure  is well controlled during office visit today. Noted average BP reading of 142/88 in the most of August with average reading of 126/83 since therapy adjustment.  Will continue current therapy without changes and continue daily BP reading to bring to next follow up in 4 weeks.    Betul Brisky Rodriguez-Guzman PharmD, BCPS, [place] [address] 06/01/2017 2:33 PM

## 2017-06-01 LAB — BASIC METABOLIC PANEL
BUN/Creatinine Ratio: 11 (ref 9–23)
BUN: 13 mg/dL (ref 6–24)
CO2: 22 mmol/L (ref 20–29)
Calcium: 9.6 mg/dL (ref 8.7–10.2)
Chloride: 106 mmol/L (ref 96–106)
Creatinine, Ser: 1.14 mg/dL — ABNORMAL HIGH (ref 0.57–1.00)
GFR calc Af Amer: 67 mL/min/{1.73_m2} (ref >59)
GFR calc non Af Amer: 58 mL/min/{1.73_m2} — ABNORMAL LOW (ref >59)
Glucose: 86 mg/dL (ref 65–99)
Potassium: 3.9 mmol/L (ref 3.5–5.2)
Sodium: 141 mmol/L (ref 134–144)

## 2017-06-01 NOTE — Assessment & Plan Note (Signed)
Blood pressure is well controlled during office visit today. Noted average BP reading of 142/88 in the most of August with average reading of 126/83 since therapy adjustment.  Will continue current therapy without changes and continue daily BP reading to bring to next follow up in 4 weeks.

## 2017-06-06 ENCOUNTER — Other Ambulatory Visit: Payer: Self-pay | Admitting: Internal Medicine

## 2017-06-07 ENCOUNTER — Telehealth: Payer: Self-pay | Admitting: Hematology and Oncology

## 2017-06-07 NOTE — Telephone Encounter (Signed)
Spoke with patient regarding the addition of her post op appt on 9/20.

## 2017-06-08 ENCOUNTER — Encounter: Payer: Self-pay | Admitting: General Practice

## 2017-06-08 NOTE — Progress Notes (Signed)
[facility] Spiritual Care Note  Followed up by phone.  Ms Cephus was limited on time due to work but reports relief at having a surgery date set because she is "looking forward to having this behind me."  This anticipation helps transform her anxiety into excitement.  She knows to contact Support Team as needed/desired along the way.   [place], [place], [facility] Pager [phone] Voicemail [phone]

## 2017-06-12 MED ORDER — ATORVASTATIN CALCIUM 80 MG PO TABS: 80.0000 mg | ORAL_TABLET | Freq: Every day | ORAL | 0 refills | Status: DC

## 2017-06-12 NOTE — Telephone Encounter (Signed)
New Message      *STAT* If patient is at the pharmacy, call can be transferred to refill team.   1. Which medications need to be refilled? (please list name of each medication and dose if known)  atorvastatin (LIPITOR) 80 MG tablet TAKE 1 TABLET BY MOUTH EVERY DAY    2. Which pharmacy/location (including street and city if local pharmacy) is medication to be sent to? [facility] on [address]   3. Do they need a 30 day or 90 day supply?  [place]

## 2017-06-12 NOTE — Telephone Encounter (Signed)
Ok to refill. Check FLP.  Dr. Lemmie Evens

## 2017-06-12 NOTE — Telephone Encounter (Signed)
Patient aware she needs fasting blood work to check cholesterol. She is aware she can come by our office at her convenience for this, no appt needed.   Rx(s) sent to pharmacy electronically.

## 2017-06-13 ENCOUNTER — Ambulatory Visit: Payer: Federal, State, Local not specified - PPO | Admitting: Family Medicine

## 2017-06-13 ENCOUNTER — Telehealth: Payer: Self-pay | Admitting: Internal Medicine

## 2017-06-13 ENCOUNTER — Ambulatory Visit (HOSPITAL_COMMUNITY): Payer: Self-pay | Admitting: Psychiatry

## 2017-06-13 DIAGNOSIS — Z79899 Other long term (current) drug therapy: Secondary | ICD-10-CM

## 2017-06-13 MED ORDER — LISINOPRIL 10 MG PO TABS: ORAL_TABLET | ORAL | 0 refills | Status: DC

## 2017-06-13 NOTE — Telephone Encounter (Signed)
Current lisinopril prescription if for 20mg  every morning and 10mg  every evening  If patient taking lisinopril as prescribed; please increase to 20mg  twice daily and repeat BMET in 1 week.   Noted appointment with HTN clinic already scheduled for 07/04/2017

## 2017-06-13 NOTE — Telephone Encounter (Signed)
Returned call to patient.  Patient reports elevated BP since last visit with HTN clinic.    BP readings: 9/4  154/97 66  163/88 77   9/5  Before medication at 6:30AM:  182/99 71  Meds were taken at 6:30 AM (lisinopril 10 and 25mg  metoprolol)  9AM:    163/101 69 Patient reports she took a half tablet (5mg ) additional lisinopril.  BP at current 146/94   Denies CP, headache, blurry vision.  Reports feeling "tingly all over her body".  Denies numbness or weakness to one extremity or the other.     Denies increase in salt intake.     Routed to HTN clinic for further recommendations.

## 2017-06-13 NOTE — Telephone Encounter (Signed)
Returned call to patient, aware of recommendations.  Verbalized understanding.  Order placed and med list update.

## 2017-06-13 NOTE — Telephone Encounter (Signed)
New Message   Pt c/o BP issue: STAT if pt c/o blurred vision, one-sided weakness or slurred speech  1. What are your last 5 BP readings? 6:30 AM 182/99 P 71, 9:00AM 163/101 P 69  2. Are you having any other symptoms (ex. Dizziness, headache, blurred vision, passed out)? Some tingling over body  3. What is your BP issue? Per pt BP is running high

## 2017-06-14 ENCOUNTER — Telehealth: Payer: Self-pay

## 2017-06-14 NOTE — Telephone Encounter (Signed)
I called this pt and requested that she either drop off or fax FMLA papers to [place].  I will have her fill out the cover sheet via phone call

## 2017-06-14 NOTE — Telephone Encounter (Signed)
Pt called requesting a letter for her work. They need to have a description of her condition. How long she will be under treatment. What radiation if needed, what chemo if needed. How long a recuperation period. What possible therapy. An out line and a overall timeframe.  Her work needs this information before surgery/ ASAP.   She works for the [employer]. She would need to pick it up.   She has her surgery 9/13, and her presurgery appts and breast seeding. She has fmla from surgery that states a finish date of 0ct 2 post op date. This is not accurate of her overall progam.  She is asking if she needs to bring an fmla from the [place]?

## 2017-06-17 ENCOUNTER — Telehealth: Payer: Self-pay | Admitting: Cardiology

## 2017-06-17 ENCOUNTER — Other Ambulatory Visit: Payer: Self-pay | Admitting: Cardiology

## 2017-06-17 MED ORDER — AMLODIPINE BESYLATE 5 MG PO TABS: 5.0000 mg | ORAL_TABLET | Freq: Every day | ORAL | 11 refills | Status: DC

## 2017-06-17 NOTE — Telephone Encounter (Signed)
Pt called with BP 195/95 and 176/95 - she is for surgery this week.  I asked her to take the lasix 20 mg once today and then I sent in amlodpine 5 mg to her pharmacy.  She will need follow up BP check on Tuesday will send note to office.

## 2017-06-19 ENCOUNTER — Ambulatory Visit (HOSPITAL_COMMUNITY): Payer: Self-pay | Admitting: Psychiatry

## 2017-06-19 ENCOUNTER — Ambulatory Visit: Payer: Self-pay | Admitting: General Surgery

## 2017-06-19 ENCOUNTER — Encounter (HOSPITAL_COMMUNITY)
Admission: RE | Admit: 2017-06-19 | Discharge: 2017-06-19 | Disposition: A | Discharge status: Home / Self Care | Payer: Federal, State, Local not specified - PPO | Source: Ambulatory Visit | Attending: General Surgery | Admitting: General Surgery

## 2017-06-19 ENCOUNTER — Telehealth: Payer: Self-pay | Admitting: Internal Medicine

## 2017-06-19 ENCOUNTER — Encounter (HOSPITAL_COMMUNITY): Payer: Self-pay

## 2017-06-19 DIAGNOSIS — Z170 Estrogen receptor positive status [ER+]: Secondary | ICD-10-CM | POA: Diagnosis not present

## 2017-06-19 DIAGNOSIS — K219 Gastro-esophageal reflux disease without esophagitis: Secondary | ICD-10-CM | POA: Diagnosis not present

## 2017-06-19 DIAGNOSIS — C50011 Malignant neoplasm of nipple and areola, right female breast: Secondary | ICD-10-CM

## 2017-06-19 DIAGNOSIS — I25119 Atherosclerotic heart disease of native coronary artery with unspecified angina pectoris: Secondary | ICD-10-CM | POA: Diagnosis not present

## 2017-06-19 DIAGNOSIS — I10 Essential (primary) hypertension: Secondary | ICD-10-CM | POA: Diagnosis not present

## 2017-06-19 DIAGNOSIS — Z87891 Personal history of nicotine dependence: Secondary | ICD-10-CM | POA: Diagnosis not present

## 2017-06-19 DIAGNOSIS — Z955 Presence of coronary angioplasty implant and graft: Secondary | ICD-10-CM | POA: Diagnosis not present

## 2017-06-19 DIAGNOSIS — F329 Major depressive disorder, single episode, unspecified: Secondary | ICD-10-CM | POA: Diagnosis not present

## 2017-06-19 DIAGNOSIS — Z79899 Other long term (current) drug therapy: Secondary | ICD-10-CM | POA: Diagnosis not present

## 2017-06-19 DIAGNOSIS — Z79891 Long term (current) use of opiate analgesic: Secondary | ICD-10-CM | POA: Diagnosis not present

## 2017-06-19 DIAGNOSIS — C50511 Malignant neoplasm of lower-outer quadrant of right female breast: Secondary | ICD-10-CM | POA: Diagnosis not present

## 2017-06-19 DIAGNOSIS — I252 Old myocardial infarction: Secondary | ICD-10-CM | POA: Diagnosis not present

## 2017-06-19 DIAGNOSIS — F419 Anxiety disorder, unspecified: Secondary | ICD-10-CM | POA: Diagnosis not present

## 2017-06-19 DIAGNOSIS — Z7982 Long term (current) use of aspirin: Secondary | ICD-10-CM | POA: Diagnosis not present

## 2017-06-19 HISTORY — DX: Cardiac arrhythmia, unspecified: I49.9

## 2017-06-19 LAB — CBC
HCT: 30.2 % — ABNORMAL LOW (ref 36.0–46.0)
Hemoglobin: 9.9 g/dL — ABNORMAL LOW (ref 12.0–15.0)
MCH: 28.2 pg (ref 26.0–34.0)
MCHC: 32.8 g/dL (ref 30.0–36.0)
MCV: 86 fL (ref 78.0–100.0)
Platelets: 429 10*3/uL — ABNORMAL HIGH (ref 150–400)
RBC: 3.51 MIL/uL — ABNORMAL LOW (ref 3.87–5.11)
RDW: 16.2 % — ABNORMAL HIGH (ref 11.5–15.5)
WBC: 11.1 10*3/uL — ABNORMAL HIGH (ref 4.0–10.5)

## 2017-06-19 LAB — COMPREHENSIVE METABOLIC PANEL
ALT: 14 U/L (ref 14–54)
AST: 19 U/L (ref 15–41)
Albumin: 3.8 g/dL (ref 3.5–5.0)
Alkaline Phosphatase: 41 U/L (ref 38–126)
Anion gap: 5 (ref 5–15)
BUN: 10 mg/dL (ref 6–20)
CO2: 26 mmol/L (ref 22–32)
Calcium: 9.1 mg/dL (ref 8.9–10.3)
Chloride: 109 mmol/L (ref 101–111)
Creatinine, Ser: 1.26 mg/dL — ABNORMAL HIGH (ref 0.44–1.00)
GFR calc Af Amer: 59 mL/min — ABNORMAL LOW (ref >60)
GFR calc non Af Amer: 51 mL/min — ABNORMAL LOW (ref >60)
Glucose, Bld: 74 mg/dL (ref 65–99)
Potassium: 3.5 mmol/L (ref 3.5–5.1)
Sodium: 140 mmol/L (ref 135–145)
Total Bilirubin: 0.2 mg/dL — ABNORMAL LOW (ref 0.3–1.2)
Total Protein: 6.5 g/dL (ref 6.5–8.1)

## 2017-06-19 LAB — HCG, SERUM, QUALITATIVE: Preg, Serum: NEGATIVE

## 2017-06-19 NOTE — Telephone Encounter (Signed)
Returned call to patient of Dr. Debara Pickett w/complaints of HTN She called in on 9/5 and lisinopril was increased to 20mg  BID She called in on 9/9 and amlodipine 5mg  was Rx'ed - she has started taking this She reports home BPs are still in the 333L systolic She has surgery on 9/13 and would like advice on what to do about her BP prior She plans to have BMET (ordered on 9/5) done tomorrow 9/12 Will route to pharmacy staff for assistance - patient also wants to know if she should stay on lisinopril 20mg  BID as this affects her kidneys and she doesn't think it is working

## 2017-06-19 NOTE — Pre-Procedure Instructions (Signed)
Anna Henson  06/19/2017      [facility] - [place], [place] - [address] [place] [address] [place] [address] Phone: [phone] Fax: [phone]    Your procedure is scheduled on 06-21-2017 Thursday   Report to [facility] Admitting at 9:00 A.M.   Call this number if you have problems the morning of surgery:  [phone]   Remember:  Do not eat food or drink liquids after midnight.   Take these medicines the morning of surgery with A SIP OF WATER Tylenol if needed,amlodipine(Norvasc),Aripiprazole(Abilfy)baclofen(Lioresal) if needed,flonase nasal spray if needed,metoprolol(lopressor),tamoxifen(nolvadex),tramadol(Ultram) if needed   FOLLOW PHYSICIAN INSTRUCTIONS OF eFFIENT  STOP ASPIRIN,ANTIINFLAMATORIES (IBUPROFEN,ALEVE,MOTRIN,ADVIL,GOODY'S POWDERS),HERBAL SUPPLEMENTS,FISH OIL,AND VITAMINS 5-7 DAYS PRIOR TO SURGERY   Do not wear jewelry, make-up or nail polish.  Do not wear lotions, powders, or perfumes, or deoderant.  Do not shave 48 hours prior to surgery.  Men may shave face and neck.  Do not bring valuables to the hospital.  [facility] is not responsible for any belongings or valuables.  Contacts, dentures or bridgework may not be worn into surgery.  Leave your suitcase in the car.  After surgery it may be brought to your room.  For patients admitted to the hospital, discharge time will be determined by your treatment team.  Patients discharged the day of surgery will not be allowed to drive home.    Special Instructions: [place] - Preparing for Surgery  Before surgery, you can play an important role.  Because skin is not sterile, your skin needs to be as free of germs as possible.  You can reduce the number of germs on you skin by washing with CHG (chlorahexidine gluconate) soap before surgery.  CHG is an antiseptic cleaner which kills germs and bonds with the skin to continue killing  germs even after washing.  Please DO NOT use if you have an allergy to CHG or antibacterial soaps.  If your skin becomes reddened/irritated stop using the CHG and inform your nurse when you arrive at Short Stay.  Do not shave (including legs and underarms) for at least 48 hours prior to the first CHG shower.  You may shave your face.  Please follow these instructions carefully:   1.  Shower with CHG Soap the night before surgery and the   morning of Surgery.  2.  If you choose to wash your hair, wash your hair first as usual with your normal shampoo.  3.  After you shampoo, rinse your hair and body thoroughly to remove the  Shampoo.  4.  Use CHG as you would any other liquid soap.  You can apply chg directly  to the skin and wash gently with scrungie or a clean washcloth.  5.  Apply the CHG Soap to your body ONLY FROM THE NECK DOWN.   Do not use on open wounds or open sores.  Avoid contact with your eyes,  ears, mouth and genitals (private parts).  Wash genitals (private parts) with your normal soap.  6.  Wash thoroughly, paying special attention to the area where your surgery will be performed.  7.  Thoroughly rinse your body with warm water from the neck down.  8.  DO NOT shower/wash with your normal soap after using and rinsing o  the CHG Soap.  9.  Pat yourself dry with a clean towel.  10.  Wear clean pajamas.            11.  Place clean sheets on your bed the night of your first shower and do not sleep with pets.  Day of Surgery  Do not apply any lotions/deodorants the morning of surgery.  Please wear clean clothes to the hospital/surgery center.   Please read over the following fact sheets that you were given. Surgical Site Infection Prevention

## 2017-06-19 NOTE — Progress Notes (Deleted)
PCP  Reginia Forts  MD  Cardiologist Dr. Debara Pickett    Stress Test 04-20-2017  Pt. Was instructed to stop Efficent 3 days prior to surgery

## 2017-06-19 NOTE — Telephone Encounter (Signed)
Patient notified of recommendations. She voiced understanding. She state she spoke with pre-op staff and they informed her that if her BP is in the 160s, she can still have surgery. She states she needs to relax a little - offered support to patient acknowledging her feelings and that she has a lot of medical situations going on right now that can contribute to her BP.

## 2017-06-19 NOTE — Telephone Encounter (Signed)
New Message     Pt c/o BP issue: STAT if pt c/o blurred vision, one-sided weakness or slurred speech  1. What are your last 5 BP readings? 163/93 pulse 67  Right now 162/85   2. Are you having any other symptoms (ex. Dizziness, headache, blurred vision, passed out)?  no  3. What is your BP issue?  She is getting ready to have surgery and they are concerned it is still high, pt does not think the lisinopril is working

## 2017-06-19 NOTE — Telephone Encounter (Signed)
Lisinopril is a good medication to have for patients with CKD, she should not stop it at this time.  There was a bump in her SCr, but not enough to warrant discontinuation of ACEI.  This is not uncommon after a dose increase and may settle back down over the next couple of weeks.   She has only been on amlodipine at most 2 doses, and it often takes 5-7 days before effect is noticed.  She should continue her meds and check BP no more than once daily.    If she is concerned about the readings because of surgery, she should let that MD know what her readings are to determine if they have any specific concerns related to surgery

## 2017-06-19 NOTE — Progress Notes (Addendum)
PCP  Anna Henson    Cardiologist Dr. Debara Pickett   Pt. Told to stop efficent 3 days prior to surgery    Stent placed in March 2018  Stress test 05-07-2017

## 2017-06-20 ENCOUNTER — Telehealth: Payer: Self-pay | Admitting: *Deleted

## 2017-06-20 LAB — LIPID PANEL
Chol/HDL Ratio: 3.2 ratio (ref 0.0–4.4)
Cholesterol, Total: 120 mg/dL (ref 100–199)
HDL: 37 mg/dL — ABNORMAL LOW (ref >39)
LDL Calculated: 69 mg/dL (ref 0–99)
Triglycerides: 70 mg/dL (ref 0–149)
VLDL Cholesterol Cal: 14 mg/dL (ref 5–40)

## 2017-06-20 LAB — BASIC METABOLIC PANEL
BUN/Creatinine Ratio: 11 (ref 9–23)
BUN: 13 mg/dL (ref 6–24)
CO2: 23 mmol/L (ref 20–29)
Calcium: 9.1 mg/dL (ref 8.7–10.2)
Chloride: 105 mmol/L (ref 96–106)
Creatinine, Ser: 1.19 mg/dL — ABNORMAL HIGH (ref 0.57–1.00)
GFR calc Af Amer: 64 mL/min/{1.73_m2} (ref >59)
GFR calc non Af Amer: 55 mL/min/{1.73_m2} — ABNORMAL LOW (ref >59)
Glucose: 94 mg/dL (ref 65–99)
Potassium: 3.9 mmol/L (ref 3.5–5.2)
Sodium: 141 mmol/L (ref 134–144)

## 2017-06-20 NOTE — Telephone Encounter (Signed)
Received call from pt stating that she is having surgery tomorrow & wanted to know if her mother could p/u her FMLA papers if she is unable to do so tomorrow.  Message relayed to [place]/HIM/FMLA

## 2017-06-20 NOTE — Progress Notes (Signed)
Anesthesia Chart Review:  Pt is a 46 year old female scheduled for R breast bracketed seed guided lumpectomy and sentinel lymph node biopsy on 06/21/2017 with Jovita Kussmaul, MD  - PCP is Reginia Forts, MD - Cardiologist is Lyman Bishop, MD who ok'd pt stopping effient and having surgery > 6 months after DES placement 12/31/16.   PMH includes:  CAD (DES to RCA 2015; DES to mid RCA 12/2016), HOCM, HTN, palpitations, hyperlipidemia, CKD (stage III), bipolar disorder, GERD. Former smoker. BMI 25  Medications include: amlodipine, abilify, ASA 81mg , lipitor, lasix, lisinopril, metoprolol, protonix, effient, tamoxifen. Pt to hold effient 3 days before surgery.   Preoperative labs reviewed.  H/H 9.9/30.2.   CXR 05/16/17: No acute cardiopulmonary process seen.  EKG 05/16/17: Sinus bradycardia (57 bpm). Low voltage QRS. Lateral infarct, age undetermined  Nuclear stress test 05/07/17:   The study is normal. No ischemia or scar  This is a low risk study.  Nuclear stress EF: 68%.  Echo 01/01/17:  - Left ventricle: The cavity size was normal. There was moderate concentric hypertrophy. Systolic function was vigorous. The estimated ejection fraction was in the range of 65% to 70%. There was dynamic obstruction at rest, with a peak velocity of 291 cm/sec and a peak gradient of 34 mm Hg. Wall motion was normal; there were no regional wall motion abnormalities. Doppler parameters are consistent with abnormal left ventricular relaxation (grade 1 diastolic dysfunction). Doppler parameters are consistent with high ventricular filling pressure. - Aortic valve: Transvalvular velocity was within the normal range. There was no stenosis. There was no regurgitation. - Mitral valve: Transvalvular velocity was within the normal range. There was no evidence for stenosis. There was no regurgitation. - Right ventricle: The cavity size was normal. Wall thickness was normal. Systolic function was normal. - Atrial septum: No  defect or patent foramen ovale was identified by color flow Doppler. - Tricuspid valve: There was trivial regurgitation. - Pulmonary arteries: Systolic pressure was within the normal range. PA peak pressure: 15 mm Hg (S).  Cardiac cath 12/31/16:  1. Acute total occlusion of the RCA, treated successful with PCI using a 2.75x38 mm Promus DES, deployed in overlapping fashion with the old stent which was widely patent 2. Mild nonobstructive LAD/LCx stenosis 3. Mild segmental LV systolic dysfunction with severe hypokinesis of the basal and mid inferior walls, but vigorous contractility of the anterior and apical walls, LVEF estimated 50-55% - Recommend:  DAPT with ASA and brilinta at least 12 months without interruption  If no changes, I anticipate pt can proceed with surgery as scheduled.   Willeen Cass, FNP-BC [facility]/Anesthesiology Phone: [phone] 06/20/2017 10:58 AM

## 2017-06-21 ENCOUNTER — Telehealth: Payer: Self-pay | Admitting: *Deleted

## 2017-06-21 ENCOUNTER — Ambulatory Visit (HOSPITAL_COMMUNITY)
Admission: RE | Admit: 2017-06-21 | Discharge: 2017-06-21 | Disposition: A | Discharge status: Home / Self Care | Payer: Federal, State, Local not specified - PPO | Source: Ambulatory Visit | Attending: General Surgery | Admitting: General Surgery

## 2017-06-21 ENCOUNTER — Encounter (HOSPITAL_COMMUNITY): Payer: Self-pay | Admitting: *Deleted

## 2017-06-21 ENCOUNTER — Ambulatory Visit (HOSPITAL_COMMUNITY): Payer: Federal, State, Local not specified - PPO | Admitting: Anesthesiology

## 2017-06-21 ENCOUNTER — Encounter (HOSPITAL_COMMUNITY)
Admission: RE | Disposition: A | Discharge status: Home / Self Care | Payer: Self-pay | Source: Ambulatory Visit | Attending: General Surgery

## 2017-06-21 ENCOUNTER — Encounter (HOSPITAL_COMMUNITY)
Admission: RE | Admit: 2017-06-21 | Discharge: 2017-06-21 | Disposition: A | Discharge status: Home / Self Care | Payer: Federal, State, Local not specified - PPO | Source: Ambulatory Visit | Attending: General Surgery | Admitting: General Surgery

## 2017-06-21 ENCOUNTER — Ambulatory Visit (HOSPITAL_COMMUNITY): Payer: Federal, State, Local not specified - PPO | Admitting: Emergency Medicine

## 2017-06-21 DIAGNOSIS — Z87891 Personal history of nicotine dependence: Secondary | ICD-10-CM | POA: Insufficient documentation

## 2017-06-21 DIAGNOSIS — C50511 Malignant neoplasm of lower-outer quadrant of right female breast: Secondary | ICD-10-CM | POA: Diagnosis not present

## 2017-06-21 DIAGNOSIS — Z7982 Long term (current) use of aspirin: Secondary | ICD-10-CM | POA: Insufficient documentation

## 2017-06-21 DIAGNOSIS — Z170 Estrogen receptor positive status [ER+]: Principal | ICD-10-CM

## 2017-06-21 DIAGNOSIS — Z79899 Other long term (current) drug therapy: Secondary | ICD-10-CM | POA: Insufficient documentation

## 2017-06-21 DIAGNOSIS — Z79891 Long term (current) use of opiate analgesic: Secondary | ICD-10-CM | POA: Insufficient documentation

## 2017-06-21 DIAGNOSIS — K219 Gastro-esophageal reflux disease without esophagitis: Secondary | ICD-10-CM | POA: Insufficient documentation

## 2017-06-21 DIAGNOSIS — F419 Anxiety disorder, unspecified: Secondary | ICD-10-CM | POA: Insufficient documentation

## 2017-06-21 DIAGNOSIS — Z955 Presence of coronary angioplasty implant and graft: Secondary | ICD-10-CM | POA: Insufficient documentation

## 2017-06-21 DIAGNOSIS — I252 Old myocardial infarction: Secondary | ICD-10-CM | POA: Insufficient documentation

## 2017-06-21 DIAGNOSIS — F329 Major depressive disorder, single episode, unspecified: Secondary | ICD-10-CM | POA: Insufficient documentation

## 2017-06-21 DIAGNOSIS — I25119 Atherosclerotic heart disease of native coronary artery with unspecified angina pectoris: Secondary | ICD-10-CM | POA: Insufficient documentation

## 2017-06-21 DIAGNOSIS — I10 Essential (primary) hypertension: Secondary | ICD-10-CM | POA: Insufficient documentation

## 2017-06-21 HISTORY — PX: BREAST LUMPECTOMY WITH RADIOACTIVE SEED AND SENTINEL LYMPH NODE BIOPSY: SHX6550

## 2017-06-21 SURGERY — BREAST LUMPECTOMY WITH RADIOACTIVE SEED AND SENTINEL LYMPH NODE BIOPSY
Anesthesia: General | Site: Breast | Laterality: Right

## 2017-06-21 MED ORDER — EPHEDRINE 5 MG/ML INJ
INTRAVENOUS | Status: AC
  Filled 2017-06-21: qty 10

## 2017-06-21 MED ORDER — CHLORHEXIDINE GLUCONATE CLOTH 2 % EX PADS: 6.0000 | MEDICATED_PAD | Freq: Once | CUTANEOUS | Status: DC

## 2017-06-21 MED ORDER — DIPHENHYDRAMINE HCL 50 MG/ML IJ SOLN
INTRAMUSCULAR | Status: AC
  Filled 2017-06-21: qty 1

## 2017-06-21 MED ORDER — METHYLENE BLUE 0.5 % INJ SOLN
INTRAVENOUS | Status: AC
  Filled 2017-06-21: qty 10

## 2017-06-21 MED ORDER — MIDAZOLAM HCL 2 MG/2ML IJ SOLN
INTRAMUSCULAR | Status: AC
  Filled 2017-06-21: qty 2

## 2017-06-21 MED ORDER — FENTANYL CITRATE (PF) 250 MCG/5ML IJ SOLN
INTRAMUSCULAR | Status: AC
  Filled 2017-06-21: qty 5

## 2017-06-21 MED ORDER — CEFAZOLIN SODIUM-DEXTROSE 2-4 GM/100ML-% IV SOLN
INTRAVENOUS | Status: AC
  Filled 2017-06-21: qty 100

## 2017-06-21 MED ORDER — FENTANYL CITRATE (PF) 100 MCG/2ML IJ SOLN
INTRAMUSCULAR | Status: AC
  Filled 2017-06-21: qty 2

## 2017-06-21 MED ORDER — ONDANSETRON HCL 4 MG/2ML IJ SOLN
INTRAMUSCULAR | Status: AC
  Filled 2017-06-21: qty 2

## 2017-06-21 MED ORDER — HYDROCODONE-ACETAMINOPHEN 5-325 MG PO TABS: 1.0000 | ORAL_TABLET | ORAL | 0 refills | Status: DC | PRN

## 2017-06-21 MED ORDER — LIDOCAINE 2% (20 MG/ML) 5 ML SYRINGE
INTRAMUSCULAR | Status: DC | PRN
  Administered 2017-06-21: 100 mg via INTRAVENOUS

## 2017-06-21 MED ORDER — LIDOCAINE 2% (20 MG/ML) 5 ML SYRINGE
INTRAMUSCULAR | Status: AC
  Filled 2017-06-21: qty 5

## 2017-06-21 MED ORDER — SODIUM CHLORIDE 0.9 % IJ SOLN
INTRAMUSCULAR | Status: AC
  Filled 2017-06-21: qty 10

## 2017-06-21 MED ORDER — ROPIVACAINE HCL 7.5 MG/ML IJ SOLN
INTRAMUSCULAR | Status: DC | PRN
  Administered 2017-06-21: 20 mL via PERINEURAL

## 2017-06-21 MED ORDER — ACETAMINOPHEN 500 MG PO TABS
1000.0000 mg | ORAL_TABLET | ORAL | Status: AC
  Administered 2017-06-21: 1000 mg via ORAL

## 2017-06-21 MED ORDER — 0.9 % SODIUM CHLORIDE (POUR BTL) OPTIME
TOPICAL | Status: DC | PRN
Start: 2017-06-21 — End: 2017-06-21
  Administered 2017-06-21: 1000 mL

## 2017-06-21 MED ORDER — CELECOXIB 200 MG PO CAPS
ORAL_CAPSULE | ORAL | Status: AC
  Filled 2017-06-21: qty 2

## 2017-06-21 MED ORDER — EPHEDRINE SULFATE-NACL 50-0.9 MG/10ML-% IV SOSY
PREFILLED_SYRINGE | INTRAVENOUS | Status: DC | PRN
  Administered 2017-06-21 (×2): 10 mg via INTRAVENOUS

## 2017-06-21 MED ORDER — GABAPENTIN 300 MG PO CAPS
300.0000 mg | ORAL_CAPSULE | ORAL | Status: AC
  Administered 2017-06-21: 300 mg via ORAL

## 2017-06-21 MED ORDER — PROPOFOL 10 MG/ML IV BOLUS
INTRAVENOUS | Status: DC | PRN
  Administered 2017-06-21: 50 mg via INTRAVENOUS
  Administered 2017-06-21: 150 mg via INTRAVENOUS

## 2017-06-21 MED ORDER — TECHNETIUM TC 99M SULFUR COLLOID FILTERED
1.0000 | Freq: Once | INTRAVENOUS | Status: AC | PRN
  Administered 2017-06-21: 1 via INTRADERMAL

## 2017-06-21 MED ORDER — LACTATED RINGERS IV SOLN
INTRAVENOUS | Status: DC
  Administered 2017-06-21: 10:00:00 via INTRAVENOUS

## 2017-06-21 MED ORDER — CEFAZOLIN SODIUM-DEXTROSE 2-4 GM/100ML-% IV SOLN
2.0000 g | INTRAVENOUS | Status: AC
  Administered 2017-06-21: 2 g via INTRAVENOUS

## 2017-06-21 MED ORDER — GABAPENTIN 300 MG PO CAPS
ORAL_CAPSULE | ORAL | Status: AC
  Filled 2017-06-21: qty 1

## 2017-06-21 MED ORDER — PROPOFOL 10 MG/ML IV BOLUS
INTRAVENOUS | Status: AC
  Filled 2017-06-21: qty 20

## 2017-06-21 MED ORDER — ACETAMINOPHEN 500 MG PO TABS
ORAL_TABLET | ORAL | Status: AC
  Filled 2017-06-21: qty 2

## 2017-06-21 MED ORDER — CELECOXIB 200 MG PO CAPS
400.0000 mg | ORAL_CAPSULE | ORAL | Status: AC
  Administered 2017-06-21: 400 mg via ORAL

## 2017-06-21 MED ORDER — FENTANYL CITRATE (PF) 100 MCG/2ML IJ SOLN
100.0000 ug | Freq: Once | INTRAMUSCULAR | Status: AC
  Administered 2017-06-21: 100 ug via INTRAVENOUS

## 2017-06-21 MED ORDER — BUPIVACAINE-EPINEPHRINE (PF) 0.25% -1:200000 IJ SOLN
INTRAMUSCULAR | Status: AC
  Filled 2017-06-21: qty 30

## 2017-06-21 MED ORDER — FENTANYL CITRATE (PF) 100 MCG/2ML IJ SOLN
INTRAMUSCULAR | Status: DC | PRN
  Administered 2017-06-21 (×4): 50 ug via INTRAVENOUS

## 2017-06-21 MED ORDER — CEFAZOLIN SODIUM-DEXTROSE 2-4 GM/100ML-% IV SOLN: 2.0000 g | INTRAVENOUS | Status: DC

## 2017-06-21 MED ORDER — DEXAMETHASONE SODIUM PHOSPHATE 10 MG/ML IJ SOLN
INTRAMUSCULAR | Status: DC | PRN
  Administered 2017-06-21: 5 mg via INTRAVENOUS

## 2017-06-21 MED ORDER — DIPHENHYDRAMINE HCL 50 MG/ML IJ SOLN
INTRAMUSCULAR | Status: DC | PRN
  Administered 2017-06-21: 10 mg via INTRAVENOUS

## 2017-06-21 MED ORDER — BUPIVACAINE-EPINEPHRINE 0.25% -1:200000 IJ SOLN
INTRAMUSCULAR | Status: DC | PRN
  Administered 2017-06-21: 30 mL

## 2017-06-21 MED ORDER — DEXAMETHASONE SODIUM PHOSPHATE 10 MG/ML IJ SOLN
INTRAMUSCULAR | Status: AC
Start: 2017-06-21 — End: ?
  Filled 2017-06-21: qty 1

## 2017-06-21 MED ORDER — ONDANSETRON HCL 4 MG/2ML IJ SOLN
INTRAMUSCULAR | Status: DC | PRN
  Administered 2017-06-21: 4 mg via INTRAVENOUS

## 2017-06-21 MED ORDER — MIDAZOLAM HCL 2 MG/2ML IJ SOLN
2.0000 mg | Freq: Once | INTRAMUSCULAR | Status: AC
  Administered 2017-06-21: 2 mg via INTRAVENOUS

## 2017-06-21 SURGICAL SUPPLY — 45 items
APPLIER CLIP 9.375 MED OPEN (MISCELLANEOUS) ×4
BLADE SURG 15 STRL LF DISP TIS (BLADE) ×1 IMPLANT
BLADE SURG 15 STRL SS (BLADE) ×1
CANISTER SUCT 3000ML PPV (MISCELLANEOUS) ×2 IMPLANT
CHLORAPREP W/TINT 26ML (MISCELLANEOUS) ×2 IMPLANT
CLIP APPLIE 9.375 MED OPEN (MISCELLANEOUS) ×2 IMPLANT
CONT SPEC 4OZ CLIKSEAL STRL BL (MISCELLANEOUS) ×2 IMPLANT
COVER PROBE W GEL 5X96 (DRAPES) ×2 IMPLANT
COVER SURGICAL LIGHT HANDLE (MISCELLANEOUS) ×2 IMPLANT
DERMABOND ADVANCED (GAUZE/BANDAGES/DRESSINGS) ×2
DERMABOND ADVANCED .7 DNX12 (GAUZE/BANDAGES/DRESSINGS) ×2 IMPLANT
DRAPE CHEST BREAST 15X10 FENES (DRAPES) ×2 IMPLANT
DRAPE UTILITY XL STRL (DRAPES) ×2 IMPLANT
ELECT COATED BLADE 2.86 ST (ELECTRODE) ×2 IMPLANT
ELECT REM PT RETURN 9FT ADLT (ELECTROSURGICAL) ×2
ELECTRODE REM PT RTRN 9FT ADLT (ELECTROSURGICAL) ×1 IMPLANT
GLOVE BIO SURGEON STRL SZ7.5 (GLOVE) ×2 IMPLANT
GLOVE BIOGEL PI IND STRL 6 (GLOVE) ×1 IMPLANT
GLOVE BIOGEL PI INDICATOR 6 (GLOVE) ×1
GLOVE INDICATOR 6.5 STRL GRN (GLOVE) ×2 IMPLANT
GOWN STRL REUS W/ TWL LRG LVL3 (GOWN DISPOSABLE) ×2 IMPLANT
GOWN STRL REUS W/TWL LRG LVL3 (GOWN DISPOSABLE) ×2
KIT BASIN OR (CUSTOM PROCEDURE TRAY) ×2 IMPLANT
KIT MARKER MARGIN INK (KITS) ×2 IMPLANT
LIGHT WAVEGUIDE WIDE FLAT (MISCELLANEOUS) IMPLANT
NDL SAFETY ECLIPSE 18X1.5 (NEEDLE) IMPLANT
NEEDLE FILTER BLUNT 18X 1/2SAF (NEEDLE)
NEEDLE FILTER BLUNT 18X1 1/2 (NEEDLE) IMPLANT
NEEDLE HYPO 18GX1.5 SHARP (NEEDLE)
NEEDLE HYPO 25GX1X1/2 BEV (NEEDLE) ×2 IMPLANT
NS IRRIG 1000ML POUR BTL (IV SOLUTION) ×2 IMPLANT
PACK SURGICAL SETUP 50X90 (CUSTOM PROCEDURE TRAY) ×2 IMPLANT
PENCIL BUTTON HOLSTER BLD 10FT (ELECTRODE) ×2 IMPLANT
SPONGE LAP 18X18 X RAY DECT (DISPOSABLE) ×2 IMPLANT
SUT MNCRL AB 4-0 PS2 18 (SUTURE) ×4 IMPLANT
SUT MON AB 4-0 PC3 18 (SUTURE) ×2 IMPLANT
SUT VIC AB 3-0 54X BRD REEL (SUTURE) ×1 IMPLANT
SUT VIC AB 3-0 BRD 54 (SUTURE) ×1
SUT VIC AB 3-0 SH 18 (SUTURE) ×2 IMPLANT
SYR BULB 3OZ (MISCELLANEOUS) ×2 IMPLANT
SYR CONTROL 10ML LL (SYRINGE) ×2 IMPLANT
TOWEL OR 17X24 6PK STRL BLUE (TOWEL DISPOSABLE) ×2 IMPLANT
TOWEL OR 17X26 10 PK STRL BLUE (TOWEL DISPOSABLE) ×2 IMPLANT
TUBE CONNECTING 12X1/4 (SUCTIONS) ×2 IMPLANT
YANKAUER SUCT BULB TIP NO VENT (SUCTIONS) ×2 IMPLANT

## 2017-06-21 NOTE — Op Note (Signed)
06/21/2017  1:02 PM  PATIENT:  Anna Henson  46 y.o. female  PRE-OPERATIVE DIAGNOSIS:  RIGHT BREAST CANCER  POST-OPERATIVE DIAGNOSIS:  RIGHT BREAST CANCER  PROCEDURE:  Procedure(s): RIGHT BREAST BRACKETED SEED GUIDED LUMPECTOMY AND DEEP RIGHT AXILLARY SENTINEL LYMPH NODE BIOPSY (Right)  SURGEON:  Surgeon(s) and Role:    * Jovita Kussmaul, MD - Primary  PHYSICIAN ASSISTANT:   ASSISTANTS: none   ANESTHESIA:   local and general  EBL:  No intake/output data recorded.  BLOOD ADMINISTERED:none  DRAINS: none   LOCAL MEDICATIONS USED:  MARCAINE     SPECIMEN:  Source of Specimen:  right breast tissue with additional margin and sentinel nodes  DISPOSITION OF SPECIMEN:  PATHOLOGY  COUNTS:  YES  TOURNIQUET:  * No tourniquets in log *  DICTATION: .Dragon Dictation   After informed consent was obtained the patient was brought to the operating room and placed in the supine position on the operating room table. After adequate induction of general anesthesia the patient's right chest, breast, and axillary area were prepped with ChloraPrep, allowed to dry, and draped in usual sterile manner. An appropriate timeout was performed. Previously the area of invasive cancer was bracketed with 3 radioactive seeds in the lower outer quadrant of the right breast. Earlier in the day the patient underwent injection of 1 mCi of technetium sulfur colloid in the subareolar position on the right. The neoprobe was initially set to technetium and an area of increased radioactivity was readily identified in the right axilla. This area was infiltrated with quarter percent Marcaine. An incision was made transversely overlying the area of radioactivity with a 15 blade knife. The incision was carried through the skin and subcutaneous tissue sharply with electrocautery until the deep right axillary space was entered. The neoprobe was used to direct blunt hemostat dissection. I was able to identify what appeared to be 3  clusters of lymph node tissue that had increased radioactivity. These areas were excised sharply with electrocautery and the lymphatics were either controlled with clips or clamped with hemostats, divided, and ligated with 3-0 Vicryl ties. Ex vivo counts on these 3 clusters ranged from [phone]. These were sent as sentinel nodes numbers 1 through 3 with a question of additional nodes in each one. No other hot or palpable lymph nodes were identified in the right axilla. The deep layer of the wound was then closed with interrupted 3-0 Vicryl stitches. The skin was closed with a running 4-0 Monocryl subcuticular stitch. Attention was then turned to the right breast. The neoprobe was set to I-125 in the area of the radioactive seeds was readily identified. An elliptical radially oriented incision was made overlying the area of radioactivity with a 15 blade knife. The incision was carried through the skin and subcutaneous tissue sharply with the electrocautery. A wedge of tissue in the lower outer quadrant right breast was excised sharply with the electrocautery all the way down to the chest wall. Once the specimen was removed it was oriented with the appropriate paint colors. A specimen radiograph was obtained that showed all 3 seeds and clips to be within the specimen. The specimen was then sent to pathology for further evaluation. I did take an additional lateral and inferior margin and sent this separately. Hemostasis was achieved using the Bovie electrocautery. The cavity was marked with clips. The wound was irrigated with saline and infiltrated with quarter percent Marcaine. The deep layer of the wound was then closed with layers of interrupted 3-0 Vicryl stitches.  The skin was then closed with interrupted 4-0 Monocryl subcuticular stitches. Dermabond dressings were applied. The patient tolerated the procedure well. At the end of the case all needle sponge and instrument counts were correct. The patient was then  awakened and taken to recovery in stable condition.  PLAN OF CARE: Discharge to home after PACU  PATIENT DISPOSITION:  PACU - hemodynamically stable.   Delay start of Pharmacological VTE agent (>24hrs) due to surgical blood loss or risk of bleeding: not applicable

## 2017-06-21 NOTE — Anesthesia Procedure Notes (Signed)
Procedure Name: LMA Insertion Date/Time: 06/21/2017 11:27 AM Performed by: Melina Copa, Dodie Parisi R Pre-anesthesia Checklist: Patient identified, Emergency Drugs available, Suction available and Patient being monitored Patient Re-evaluated:Patient Re-evaluated prior to induction Oxygen Delivery Method: Circle System Utilized Preoxygenation: Pre-oxygenation with 100% oxygen Induction Type: IV induction Ventilation: Mask ventilation without difficulty LMA: LMA inserted LMA Size: 4.0 Number of attempts: 1 Placement Confirmation: positive ETCO2 Tube secured with: Tape Dental Injury: Teeth and Oropharynx as per pre-operative assessment

## 2017-06-21 NOTE — Anesthesia Procedure Notes (Addendum)
Anesthesia Regional Block: Pectoralis block   Pre-Anesthetic Checklist: ,, timeout performed, Correct Patient, Correct Site, Correct Laterality, Correct Procedure, Correct Position, site marked, Risks and benefits discussed,  Surgical consent,  Pre-op evaluation,  At surgeon's request and post-op pain management  Laterality: Right  Prep: chloraprep       Needles:  Injection technique: Single-shot  Needle Type: Echogenic Stimulator Needle     Needle Length: 5cm  Needle Gauge: 22     Additional Needles:   Procedures:, nerve stimulator,,,,,,,  Narrative:  Start time: 06/21/2017 10:55 AM End time: 06/21/2017 11:05 AM Injection made incrementally with aspirations every 5 mL.  Performed by: Personally  Anesthesiologist: Kamilla Hands  Additional Notes: Functioning IV was confirmed and monitors were applied.  A 31mm 22ga Arrow echogenic stimulator needle was used. Sterile prep and drape,hand hygiene and sterile gloves were used. Ultrasound guidance: relevant anatomy identified, needle position confirmed, local anesthetic spread visualized around nerve(s)., vascular puncture avoided.  Image printed for medical record. Negative aspiration and negative test dose prior to incremental administration of local anesthetic. The patient tolerated the procedure well.

## 2017-06-21 NOTE — Transfer of Care (Signed)
Immediate Anesthesia Transfer of Care Note  Patient: Anna Henson  Procedure(s) Performed: Procedure(s): RIGHT BREAST BRACKETED SEED GUIDED LUMPECTOMY AND SENTINEL LYMPH NODE BIOPSY (Right)  Patient Location: PACU  Anesthesia Type:General  Level of Consciousness: awake, oriented and patient cooperative  Airway & Oxygen Therapy: Patient Spontanous Breathing and Patient connected to nasal cannula oxygen  Post-op Assessment: Report given to RN, Post -op Vital signs reviewed and stable and Patient moving all extremities  Post vital signs: Reviewed and stable  Last Vitals:  Vitals:   06/21/17 1105 06/21/17 1110  BP: (!) 144/66 121/60  Pulse: 60 64  Resp: 13 14  Temp:    SpO2: 100% 100%    Last Pain:  Vitals:   06/21/17 0925  TempSrc: Oral         Complications: No apparent anesthesia complications

## 2017-06-21 NOTE — Anesthesia Preprocedure Evaluation (Addendum)
Anesthesia Evaluation  Patient identified by MRN, date of birth, ID band Patient awake    Reviewed: Allergy & Precautions, NPO status , Patient's Chart, lab work & pertinent test results  Airway Mallampati: II  TM Distance: >3 FB Neck ROM: Full    Dental no notable dental hx.    Pulmonary neg pulmonary ROS, former smoker,    Pulmonary exam normal breath sounds clear to auscultation       Cardiovascular hypertension, + angina + CAD and + Past MI  negative cardio ROS Normal cardiovascular exam+ dysrhythmias  Rhythm:Regular Rate:Normal     Neuro/Psych  Headaches, PSYCHIATRIC DISORDERS Anxiety Depression negative neurological ROS  negative psych ROS   GI/Hepatic negative GI ROS, Neg liver ROS, GERD  ,  Endo/Other  negative endocrine ROS  Renal/GU Renal diseasenegative Renal ROS  negative genitourinary   Musculoskeletal negative musculoskeletal ROS (+)   Abdominal   Peds negative pediatric ROS (+)  Hematology negative hematology ROS (+)   Anesthesia Other Findings Nuclear stress test 05/07/17:   The study is normal. No ischemia or scar  This is a low risk study.  Nuclear stress EF: 68%.  Echo 01/01/17:  - Left ventricle: The cavity size was normal. There was moderate concentric hypertrophy. Systolic function was vigorous. The estimated ejection fraction was in the range of 65% to 70%. There was dynamic obstruction at rest, with a peak velocity of 291 cm/sec and a peak gradient of 34 mm Hg. Wall motion was normal; there were no regional wall motion abnormalities. Dopplerparameters are consistent with abnormal left ventricularrelaxation (grade 1 diastolic dysfunction). Doppler parametersare consistent with high ventricular filling pressure. - Aortic valve: Transvalvular velocity was within the normal range. There was no stenosis. There was no regurgitation. - Mitral valve: Transvalvular velocity was within the normal  range. There was no evidence for stenosis. There was no regurgitation. - Right ventricle: The cavity size was normal. Wall thickness was normal. Systolic function was normal. - Atrial septum: No defect or patent foramen ovale was identified by color flow Doppler. - Tricuspid valve: There was trivial regurgitation. - Pulmonary arteries: Systolic pressure was within the normal range. PA peak pressure: 15 mm Hg (S).  Cardiac cath 12/31/16:  1. Acute total occlusion of the RCA, treated successful with PCI using a 2.75x38 mm Promus DES, deployed in overlapping fashion with the old stent which was widely patent 2. Mild nonobstructive LAD/LCx stenosis 3. Mild segmental LV systolic dysfunction with severe hypokinesis of the basal and mid inferior walls, but vigorous contractility of the anterior and apical walls, LVEF estimated 50-55%  Reproductive/Obstetrics negative OB ROS                            Anesthesia Physical Anesthesia Plan  ASA: III  Anesthesia Plan: General   Post-op Pain Management:    Induction: Intravenous  PONV Risk Score and Plan: 2 and Ondansetron, Dexamethasone, Treatment may vary due to age or medical condition and Midazolam  Airway Management Planned: LMA  Additional Equipment:   Intra-op Plan:   Post-operative Plan:   Informed Consent:   Plan Discussed with: CRNA and Surgeon  Anesthesia Plan Comments: ( )        Anesthesia Quick Evaluation                                   Anesthesia Evaluation  Airway        Dental   Pulmonary former smoker,           Cardiovascular hypertension, + angina + CAD and + Past MI  + dysrhythmias      Neuro/Psych  Headaches, PSYCHIATRIC DISORDERS Anxiety Depression    GI/Hepatic GERD  ,  Endo/Other    Renal/GU Renal disease     Musculoskeletal   Abdominal   Peds  Hematology   Anesthesia Other Findings Nuclear stress test 05/07/17:   The study is normal. No  ischemia or scar  This is a low risk study.  Nuclear stress EF: 68%.  Echo 01/01/17:  - Left ventricle: The cavity size was normal. There was moderate concentric hypertrophy. Systolic function was vigorous. The estimated ejection fraction was in the range of 65% to 70%. There was dynamic obstruction at rest, with a peak velocity of 291 cm/sec and a peak gradient of 34 mm Hg. Wall motion was normal; there were no regional wall motion abnormalities. Dopplerparameters are consistent with abnormal left ventricularrelaxation (grade 1 diastolic dysfunction). Doppler parametersare consistent with high ventricular filling pressure. - Aortic valve: Transvalvular velocity was within the normal range. There was no stenosis. There was no regurgitation. - Mitral valve: Transvalvular velocity was within the normal range. There was no evidence for stenosis. There was no regurgitation. - Right ventricle: The cavity size was normal. Wall thickness was normal. Systolic function was normal. - Atrial septum: No defect or patent foramen ovale was identified by color flow Doppler. - Tricuspid valve: There was trivial regurgitation. - Pulmonary arteries: Systolic pressure was within the normal range. PA peak pressure: 15 mm Hg (S).  Cardiac cath 12/31/16:  1. Acute total occlusion of the RCA, treated successful with PCI using a 2.75x38 mm Promus DES, deployed in overlapping fashion with the old stent which was widely patent 2. Mild nonobstructive LAD/LCx stenosis 3. Mild segmental LV systolic dysfunction with severe hypokinesis of the basal and mid inferior walls, but vigorous contractility of the anterior and apical walls, LVEF estimated 50-55%  Reproductive/Obstetrics                             Anesthesia Physical Anesthesia Plan Anesthesia Quick Evaluation

## 2017-06-21 NOTE — Telephone Encounter (Signed)
"  I just had surgery.  Need to know the status of a FMLA disability form I faxed 06-14-2017.  My job needs the form.  Can My mom pick this up or do I need to sign an authorization or something?"   Confirmed receipt with H.I.M.  Projected completion date of September 21-2018 with [place] protocol to complete forms within ten business days.  Expect a call when form completed.  Form will be at front desk for pick up.  Okay for mom to pick up form per H.I.M.   No further questions at this time.

## 2017-06-21 NOTE — H&P (Signed)
Anna Henson  Location: [facility] Patient #: [id number] DOB: 1971/02/15 Single / Language: Anna Henson / Race: Black or African American Female   History of Present Illness  The patient is a 46 year old female who presents for a follow-up for Breast cancer. the patient is a 46 year old black female who has a 4.5 cm area of invasive lobular cancer in the lower outer right breast. The cancer was ER and PR positive and HER-2 negative with a Ki-67 of 2%. She also seems to have a variant of unknown significance on genetic testing. When we initially saw her she had a recent stent placed in her heart for an MI. She was placed on an antiestrogen and she is now been on her bloodthinner her long enough that we could do a definitive surgery. She still prefers breast conservation if possible. She has tolerated the antiestrogen well.   Allergies  No Known Allergies   Medication History  ARIPiprazole (5MG Tablet, Oral) Active. Aspirin (81MG Tablet Chewable, Oral) Active. Atorvastatin Calcium (80MG Tablet, Oral) Active. Baclofen (10MG Tablet, Oral) Active. Flonase (50MCG/ACT Suspension, Nasal) Active. Gabapentin (300MG Capsule, Oral) Active. LamoTRIgine (100MG Tablet, Oral) Active. Lisinopril (2.5MG Tablet, Oral) Active. Metoprolol Succinate (50MG Tablet ER, Oral) Active. Nitroglycerin (0.4MG Tab Sublingual, Sublingual) Active. Ticagrelor (90MG Tablet, Oral) Active. TraMADol HCl (50MG Tablet, Oral) Active. Triamterene-HCTZ (37.5-25MG Capsule, Oral) Active. Medications Reconciled    Review of Systems General Present- Appetite Loss, Fatigue and Weight Loss. Not Present- Chills, Fever, Night Sweats and Weight Gain. Skin Present- Rash. Not Present- Change in Wart/Mole, Dryness, Hives, Jaundice, New Lesions, Non-Healing Wounds and Ulcer. HEENT Present- Sinus Pain, Visual Disturbances and Wears glasses/contact lenses. Not Present- Earache, Hearing Loss, Hoarseness, Nose  Bleed, Oral Ulcers, Ringing in the Ears, Seasonal Allergies, Sore Throat and Yellow Eyes. Respiratory Not Present- Bloody sputum, Chronic Cough, Difficulty Breathing, Snoring and Wheezing. Breast Present- Breast Pain. Not Present- Breast Mass, Nipple Discharge and Skin Changes. Cardiovascular Present- Leg Cramps and Swelling of Extremities. Not Present- Chest Pain, Difficulty Breathing Lying Down, Palpitations, Rapid Heart Rate and Shortness of Breath. Gastrointestinal Present- Abdominal Pain, Bloating and Constipation. Not Present- Bloody Stool, Change in Bowel Habits, Chronic diarrhea, Difficulty Swallowing, Excessive gas, Gets full quickly at meals, Hemorrhoids, Indigestion, Nausea, Rectal Pain and Vomiting. Female Genitourinary Not Present- Frequency, Nocturia, Painful Urination, Pelvic Pain and Urgency. Musculoskeletal Present- Joint Stiffness, Muscle Pain and Muscle Weakness. Not Present- Back Pain, Joint Pain and Swelling of Extremities. Neurological Present- Headaches, Numbness, Tingling, Trouble walking and Weakness. Not Present- Decreased Memory, Fainting, Seizures and Tremor. Psychiatric Present- Anxiety, Bipolar, Depression and Fearful. Not Present- Change in Sleep Pattern and Frequent crying. Endocrine Not Present- Cold Intolerance, Excessive Hunger, Hair Changes, Heat Intolerance, Hot flashes and New Diabetes. Hematology Present- Blood Thinners, Easy Bruising and Excessive bleeding. Not Present- Gland problems, HIV and Persistent Infections.  Vitals  Weight: 133 lb Height: 61in Body Surface Area: 1.59 m Body Mass Index: 25.13 kg/m  Temp.: 98.5F  Pulse: 75 (Regular)  BP: 170/90 (Sitting, Left Arm, Standard)       Physical Exam  General Mental Status-Alert. General Appearance-Consistent with stated age. Hydration-Well hydrated. Voice-Normal.  Head and Neck Head-normocephalic, atraumatic with no lesions or palpable  masses. Trachea-midline. Thyroid Gland Characteristics - normal size and consistency.  Eye Eyeball - Bilateral-Extraocular movements intact. Sclera/Conjunctiva - Bilateral-No scleral icterus.  Chest and Lung Exam Chest and lung exam reveals -quiet, even and easy respiratory effort with no use of accessory muscles and on auscultation, normal  breath sounds, no adventitious sounds and normal vocal resonance. Inspection Chest Wall - Normal. Back - normal.  Breast Note: the previously palpable mass in the lower outer right breast is difficult to feel. There is no skin changes. there is no palpable axillary, supraclavicular, or cervical lymphadenopathy   Cardiovascular Cardiovascular examination reveals -normal heart sounds, regular rate and rhythm with no murmurs and normal pedal pulses bilaterally.  Abdomen Inspection Inspection of the abdomen reveals - No Hernias. Skin - Scar - no surgical scars. Palpation/Percussion Palpation and Percussion of the abdomen reveal - Soft, Non Tender, No Rebound tenderness, No Rigidity (guarding) and No hepatosplenomegaly. Auscultation Auscultation of the abdomen reveals - Bowel sounds normal.  Neurologic Neurologic evaluation reveals -alert and oriented x 3 with no impairment of recent or remote memory. Mental Status-Normal.  Musculoskeletal Normal Exam - Left-Upper Extremity Strength Normal and Lower Extremity Strength Normal. Normal Exam - Right-Upper Extremity Strength Normal and Lower Extremity Strength Normal.  Lymphatic Head & Neck  General Head & Neck Lymphatics: Bilateral - Description - Normal. Axillary  General Axillary Region: Bilateral - Description - Normal. Tenderness - Non Tender. Femoral & Inguinal  Generalized Femoral & Inguinal Lymphatics: Bilateral - Description - Normal. Tenderness - Non Tender.    Assessment & Plan  MALIGNANT NEOPLASM OF LOWER-OUTER QUADRANT OF RIGHT BREAST OF FEMALE, ESTROGEN  RECEPTOR POSITIVE (C50.511) Impression: The patient has a 4.5 cm area of invasive lobular cancer in the lower outer quadrant of the right breast. It seems to be responding to the neoadjuvant antiestrogen therapy. She is ready to schedule surgery. I have talked to her again about the different options and at this point she favors breast conservation. She understands that if she has positive margins that mastectomy may be the next step. She is also a good candidate for sentinel node mapping. I have discussed with her in detail the risks and benefits of the operation as well as some of the technical aspects and she understands and wishes to proceed. I will plan to bracket the area in the right breast with 2 radioactive seeds. I will also get cardiac clearance again from Dr. Debara Pickett prior to scheduling

## 2017-06-21 NOTE — Anesthesia Postprocedure Evaluation (Signed)
Anesthesia Post Note  Patient: Anna Henson  Procedure(s) Performed: Procedure(s) (LRB): RIGHT BREAST BRACKETED SEED GUIDED LUMPECTOMY AND SENTINEL LYMPH NODE BIOPSY (Right)     Patient location during evaluation: PACU Anesthesia Type: General Level of consciousness: awake and alert Pain management: pain level controlled Vital Signs Assessment: post-procedure vital signs reviewed and stable Respiratory status: spontaneous breathing, nonlabored ventilation, respiratory function stable and patient connected to nasal cannula oxygen Cardiovascular status: blood pressure returned to baseline and stable Postop Assessment: no apparent nausea or vomiting Anesthetic complications: no    Last Vitals:  Vitals:   06/21/17 1110 06/21/17 1310  BP: 121/60 (!) 155/82  Pulse: 64 70  Resp: 14   Temp:  (!) 36.4 C  SpO2: 100% 100%    Last Pain:  Vitals:   06/21/17 1310  TempSrc:   PainSc: 0-No pain                 Aleksander Edmiston

## 2017-06-21 NOTE — Interval H&P Note (Signed)
History and Physical Interval Note:  06/21/2017 10:51 AM  Anna Henson  has presented today for surgery, with the diagnosis of RIGHT BREAST CANCER  The various methods of treatment have been discussed with the patient and family. After consideration of risks, benefits and other options for treatment, the patient has consented to  Procedure(s): RIGHT BREAST BRACKETED SEED GUIDED LUMPECTOMY AND SENTINEL LYMPH NODE BIOPSY (Right) as a surgical intervention .  The patient's history has been reviewed, patient examined, no change in status, stable for surgery.  I have reviewed the patient's chart and labs.  Questions were answered to the patient's satisfaction.     TOTH III,Maeson Lourenco S

## 2017-06-22 ENCOUNTER — Encounter (HOSPITAL_COMMUNITY): Payer: Self-pay | Admitting: General Surgery

## 2017-06-25 ENCOUNTER — Encounter: Payer: Self-pay | Admitting: Neurology

## 2017-06-26 ENCOUNTER — Encounter: Payer: Self-pay | Admitting: *Deleted

## 2017-06-28 ENCOUNTER — Telehealth: Payer: Self-pay | Admitting: *Deleted

## 2017-06-28 ENCOUNTER — Ambulatory Visit (HOSPITAL_BASED_OUTPATIENT_CLINIC_OR_DEPARTMENT_OTHER): Payer: Federal, State, Local not specified - PPO | Admitting: Hematology and Oncology

## 2017-06-28 ENCOUNTER — Telehealth: Payer: Self-pay | Admitting: Hematology and Oncology

## 2017-06-28 DIAGNOSIS — Z170 Estrogen receptor positive status [ER+]: Secondary | ICD-10-CM

## 2017-06-28 DIAGNOSIS — C50511 Malignant neoplasm of lower-outer quadrant of right female breast: Secondary | ICD-10-CM

## 2017-06-28 DIAGNOSIS — N644 Mastodynia: Secondary | ICD-10-CM | POA: Diagnosis not present

## 2017-06-28 DIAGNOSIS — N63 Unspecified lump in unspecified breast: Secondary | ICD-10-CM

## 2017-06-28 NOTE — Progress Notes (Signed)
Patient Care Team: Wardell Honour, MD as PCP - General (Family Medicine) Felecia Shelling, Nanine Means, MD as Consulting Physician (Neurology) Jovita Kussmaul, MD as Consulting Physician (General Surgery) Nicholas Lose, MD as Consulting Physician (Hematology and Oncology) Kyung Rudd, MD as Consulting Physician (Radiation Oncology)  DIAGNOSIS:  Encounter Diagnosis  Name Primary?  . Malignant neoplasm of lower-outer quadrant of right breast of female, estrogen receptor positive ([place])     SUMMARY OF ONCOLOGIC HISTORY:   Malignant neoplasm of lower-outer quadrant of right breast of female, estrogen receptor positive ([place])   03/26/2017 Initial Diagnosis    Palpable right breast mass with a normal mammogram: 8:00 position by ultrasound 3.2 cm irregular mass: Grade 2 invasive lobular cancer ER 85%, PR 100%, Ki-67 2%, HER-2 negative ratio 1.5, T2 N0 stage II a clinical stage      04/20/2017 Genetic Testing    Genetic counseling and testing for hereditary cancer syndromes performed on . Results are negative for pathogenic mutations in 46 genes analyzed by [employer] Common Hereditary Cancers Panel. Results are dated . Genes tested: APC, ATM, AXIN2, BARD1, BMPR1A, BRCA1, BRCA2, BRIP1, CDH1, CDKN2A, CHEK2, CTNNA1, DICER1, EPCAM, GREM1, HOXB13, KIT, MEN1, MLH1, MSH2, MSH3, MSH6, MUTYH, NBN, NF1, NTHL1, PALB2, PDGFRA, PMS2, POLD1, POLE, PTEN, RAD50, RAD51C, RAD51D, SDHA, SDHB, SDHC, SDHD, SMAD4, SMARCA4, STK11, TP53, TSC1, TSC2, and VHL.  Variants of uncertain significance (not clinically actionable) were noted in APC and BRIP1.         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 She has a lot of swelling of the breast and pain and discomfort. There is a big bruise on the chest wall.  REVIEW OF SYSTEMS:   Constitutional: Denies fevers, chills or abnormal weight loss Eyes: Denies blurriness of vision Ears, nose, mouth, throat, and face: Denies mucositis or sore throat Respiratory: Denies cough, dyspnea or wheezes Cardiovascular: Denies palpitation, chest discomfort Gastrointestinal:  Denies nausea, heartburn or change in bowel habits Skin: Denies abnormal skin rashes Lymphatics: Denies new lymphadenopathy or easy bruising Neurological:Denies numbness, tingling or new weaknesses Behavioral/Psych: Mood is stable, no new changes  Extremities: No lower extremity edema Breast: Recent right lumpectomy All other systems were reviewed with the patient and are negative.  I have reviewed the past medical history, past surgical history, social history and family history with the patient and they are unchanged from previous note.  ALLERGIES:  has No Known Allergies.  MEDICATIONS:  Current Outpatient Prescriptions  Medication Sig Dispense Refill  . acetaminophen (TYLENOL) 325 MG tablet Take 650 mg by mouth every 6 (six) hours as needed for mild pain or headache.    Marland Kitchen amLODipine (NORVASC) 5 MG tablet Take 1 tablet (5 mg total) by mouth daily. 30 tablet 11  . ARIPiprazole (ABILIFY) 5 MG tablet Take 1 tablet (5 mg total) by mouth daily. 90 tablet 0  . aspirin 81 MG chewable tablet Chew 1 tablet (81 mg total) by mouth daily.    Marland Kitchen atorvastatin (LIPITOR) 80 MG tablet Take 1 tablet (80 mg total) by mouth daily. (Patient taking differently: Take 80 mg  by mouth daily at 6 PM. ) 90 tablet 0  . baclofen (LIORESAL) 10 MG tablet Take up to 4/day for muscle spasms (Patient taking differently: Take 5-10 mg by mouth 4 (four) times daily as needed for muscle spasms (1 tablet in the morning and 1 inth eevenining and 2 at  bedtime). ) 120 each 11  . fluticasone (FLONASE) 50 MCG/ACT nasal spray SHAKE LIQUID AND USE 2 SPRAYS IN EACH NOSTRIL DAILY (Patient taking differently: SHAKE LIQUID AND USE 2 SPRAYS IN EACH NOSTRIL DAILY as need rhinitis) 16 g 5  . folic acid (FOLVITE) 1 MG tablet Take 1 mg by mouth daily.    . furosemide (LASIX) 20 MG tablet TAKE 1 TABLET(20 MG) BY MOUTH DAILY AS NEEDED (Patient taking differently: TAKE 1 TABLET(20 MG) BY MOUTH DAILY AS NEEDED FOR FLUID RETENTION) 10 tablet 0  . gabapentin (NEURONTIN) 300 MG capsule Take one pill in the morning, two in the afternoon and two po at bedtime (Patient taking differently: Take 300-600 mg by mouth See admin instructions. Take 1 capsule every morning then take 2 capsules in the afternoon and at 2 at bedtime) 150 capsule 11  . HYDROcodone-acetaminophen (NORCO/VICODIN) 5-325 MG tablet Take 1-2 tablets by mouth every 4 (four) hours as needed for moderate pain or severe pain. 20 tablet 0  . hydrOXYzine (VISTARIL) 25 MG capsule Take 1-2 capsule at bed time for anxiety and insomnia (Patient taking differently: Take 25-50 mg by mouth at bedtime as needed (1-2 depending on anxiety level). Take 1-2 capsule at bed time for anxiety and insomnia) 60 capsule 1  . lisinopril (PRINIVIL,ZESTRIL) 10 MG tablet Take 20 mg every morning and 96m every evening (Patient taking differently: Take 20 mg by mouth 2 (two) times daily. ) 90 tablet 0  . metoprolol (LOPRESSOR) 50 MG tablet Take 0.5 tablets (25 mg total) by mouth 2 (two) times daily. TAKE 1 TABLET BY MOUTH TWICE DAILY. (Patient taking differently: Take 25 mg by mouth 2 (two) times daily. ) 90 tablet 1  . nitroGLYCERIN (NITROSTAT) 0.4 MG SL tablet Place 1 tablet (0.4 mg total) under the tongue every 5 (five) minutes as needed for chest pain. 25 tablet 1  . Omega-3 Fatty Acids (OMEGA 3 PO) Take 2 capsules by mouth daily.    . pantoprazole (PROTONIX) 40 MG tablet Take 1 tablet (40 mg total) by mouth 2 (two) times daily. 30  tablet 6  . prasugrel (EFFIENT) 10 MG TABS tablet Take 1 tablet (10 mg total) by mouth daily. Take 6 tablets by mouth once and then take 1 tablet daily in the morning. (Patient taking differently: Take 10 mg by mouth daily. ) 96 tablet 1  . Probiotic Product (PROBIOTIC PO) Take 1 tablet by mouth daily.    . tamoxifen (NOLVADEX) 20 MG tablet Take 1 tablet (20 mg total) by mouth daily. 90 tablet 3  . traMADol (ULTRAM) 50 MG tablet Take 1 tablet (50 mg total) by mouth 4 (four) times daily as needed. (Patient taking differently: Take 50-100 mg by mouth every 12 (twelve) hours as needed for moderate pain or severe pain (depends on pain level if takes 1-2 tablets). ) 120 tablet 4   No current facility-administered medications for this visit.    Facility-Administered Medications Ordered in Other Visits  Medication Dose Route Frequency Provider Last Rate Last Dose  . gadopentetate dimeglumine (MAGNEVIST) injection 16 mL  16 mL Intravenous Once PRN Sater, RNanine Means MD        PHYSICAL  EXAMINATION: ECOG PERFORMANCE STATUS: 1 - Symptomatic but completely ambulatory  Vitals:   06/28/17 1516  BP: (!) 145/78  Pulse: 78  Resp: 20  Temp: 98.4 F (36.9 C)  SpO2: 100%   Filed Weights   06/28/17 1516  Weight: 144 lb 12.8 oz (65.7 kg)    GENERAL:alert, no distress and comfortable SKIN: skin color, texture, turgor are normal, no rashes or significant lesions EYES: normal, Conjunctiva are pink and non-injected, sclera clear OROPHARYNX:no exudate, no erythema and lips, buccal mucosa, and tongue normal  NECK: supple, thyroid normal size, non-tender, without nodularity LYMPH:  no palpable lymphadenopathy in the cervical, axillary or inguinal LUNGS: clear to auscultation and percussion with normal breathing effort HEART: regular rate & rhythm and no murmurs and no lower extremity edema ABDOMEN:abdomen soft, non-tender and normal bowel sounds MUSCULOSKELETAL:no cyanosis of digits and no clubbing  NEURO:  alert & oriented x 3 with fluent speech, no focal motor/sensory deficits EXTREMITIES: No lower extremity edema BREAST:Right breast was examined and there is a significant swelling and lymphedema appearance. There is a significant bruise on the chest wall. No evidence of infection. (exam performed in the presence of a chaperone)  LABORATORY DATA:  I have reviewed the data as listed   Chemistry      Component Value Date/Time   NA 141 06/20/2017 0858   NA 139 04/04/2017 0835   K 3.9 06/20/2017 0858   K 3.6 04/04/2017 0835   CL 105 06/20/2017 0858   CO2 23 06/20/2017 0858   CO2 25 04/04/2017 0835   BUN 13 06/20/2017 0858   BUN 13.9 04/04/2017 0835   CREATININE 1.19 (H) 06/20/2017 0858   CREATININE 1.3 (H) 04/04/2017 0835      Component Value Date/Time   CALCIUM 9.1 06/20/2017 0858   CALCIUM 10.0 04/04/2017 0835   ALKPHOS 41 06/19/2017 1444   ALKPHOS 69 04/04/2017 0835   AST 19 06/19/2017 1444   AST 23 04/04/2017 0835   ALT 14 06/19/2017 1444   ALT 22 04/04/2017 0835   BILITOT 0.2 (L) 06/19/2017 1444   BILITOT <0.22 04/04/2017 0835       Lab Results  Component Value Date   WBC 11.1 (H) 06/19/2017   HGB 9.9 (L) 06/19/2017   HCT 30.2 (L) 06/19/2017   MCV 86.0 06/19/2017   PLT 429 (H) 06/19/2017   NEUTROABS 6.0 04/04/2017    ASSESSMENT & PLAN:  Malignant neoplasm of lower-outer quadrant of right breast of female, estrogen receptor positive ([place]) 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 I discussed the margins as well as lymph node surgeries. We also discussed the final staging along with previously performed ER/PR and HER-2/neu testing.  Recommendation: 1. Oncotype DX testing to determine if she would need  chemotherapy 2. followed by adjuvant radiation 3. Followed by adjuvant antiestrogen therapy with tamoxifen 20 mg daily 10 years  Oncotype counseling: I discussed Oncotype DX test. I explained to the patient that this is a 21 gene panel to evaluate patient tumors DNA to calculate recurrence score. This would help determine whether patient has high risk or intermediate risk or low risk breast cancer. She understands that if her tumor was found to be high risk, she would benefit from  systemic chemotherapy. If low risk, no need of chemotherapy. If she was found to be intermediate risk, we would need to evaluate the score as well as other risk factors and determine if an abbreviated chemotherapy may be of benefit.  Return to clinic based upon Oncotype DX test result    I spent 25 minutes talking to the patient of which more than half was spent in counseling and coordination of care.  No orders of the defined types were placed in this encounter.  The patient has a good understanding of the overall plan. she agrees with it. she will call with any problems that may develop before the next visit here.   Rulon Eisenmenger, MD 06/28/17

## 2017-06-28 NOTE — Telephone Encounter (Signed)
Received oncotype testing order. Requisition sent to pathology.

## 2017-06-28 NOTE — Assessment & Plan Note (Signed)
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 I discussed the margins as well as lymph node surgeries. We also discussed the final staging along with previously performed ER/PR and HER-2/neu testing.  Recommendation: 1. Oncotype DX testing to determine if she would need chemotherapy 2. followed by adjuvant radiation 3. Followed by adjuvant antiestrogen therapy with tamoxifen 20 mg daily 10 years  Oncotype counseling: I discussed Oncotype DX test. I explained to the patient that this is a 21 gene panel to evaluate patient tumors DNA to calculate recurrence score. This would help determine whether patient has high risk or intermediate risk or low risk breast cancer. She understands that if her tumor was found to be high risk, she would benefit from systemic chemotherapy. If low risk, no need of chemotherapy. If she was found to be intermediate risk, we would need to evaluate the score as well as other risk factors and determine if an abbreviated chemotherapy may be of benefit.  Return to clinic based upon Oncotype DX test result

## 2017-06-28 NOTE — Addendum Note (Signed)
Addendum  created 06/28/17 1030 by Janeece Riggers, MD   Anesthesia Intra Blocks edited, Sign clinical note

## 2017-06-28 NOTE — Telephone Encounter (Signed)
No 9/20 los.  

## 2017-07-02 ENCOUNTER — Telehealth: Payer: Self-pay | Admitting: Internal Medicine

## 2017-07-02 NOTE — Telephone Encounter (Signed)
Received Signed FMLA Paperwork from Dr Debara Pickett.  Sent signed FMLA Forms and packet back to [facility] via [place].  Sent via [place] on 07/02/17. lp

## 2017-07-04 ENCOUNTER — Ambulatory Visit: Payer: Federal, State, Local not specified - PPO

## 2017-07-05 ENCOUNTER — Ambulatory Visit (HOSPITAL_COMMUNITY): Payer: Self-pay | Admitting: Psychiatry

## 2017-07-06 ENCOUNTER — Encounter (HOSPITAL_COMMUNITY): Payer: Self-pay

## 2017-07-09 ENCOUNTER — Encounter: Payer: Self-pay | Admitting: Radiation Oncology

## 2017-07-09 ENCOUNTER — Telehealth: Payer: Self-pay | Admitting: *Deleted

## 2017-07-09 DIAGNOSIS — Z170 Estrogen receptor positive status [ER+]: Principal | ICD-10-CM

## 2017-07-09 DIAGNOSIS — C50511 Malignant neoplasm of lower-outer quadrant of right female breast: Secondary | ICD-10-CM

## 2017-07-09 NOTE — Telephone Encounter (Signed)
Received oncotype score of 16/10%. Physician team notified. Called pt with results. Discussed next steps in that she will be scheduled with Dr. Lisbeth Renshaw for xrt consults. Received verbal understanding. Denies further questions.

## 2017-07-19 NOTE — Progress Notes (Addendum)
Location of Breast Cancer: Lower-outer quadrant of right breast of femal  Histology per Pathology Report: 06-21-17  Diagnosis  06-21-17 Anna Henson, M.Henson. 1. Breast, lumpectomy, Right w/seed - MIXED INVASIVE LOBULAR AND DUCTAL CARCINOMA, GRADE 2-3, SPANNING 5 CM. - LOBULAR NEOPLASIA (LOBULAR CARCINOMA IN SITU). - FINAL RESECTION MARGINS ARE NEGATIVE. - BIOPSY SITE (X3). - FIBROADENOMATOID AND FIBROCYSTIC CHANGE. - SEE ONCOLOGY TABLE. 2. Lymph node, sentinel, biopsy, Right Axillary #1 - ONE OF ONE LYMPH NODES NEGATIVE FOR CARCINOMA (0/1). 3. Lymph node, sentinel, biopsy, Right - ONE OF ONE LYMPH NODES NEGATIVE FOR CARCINOMA (0/1). 4. Lymph node, sentinel, biopsy, Right - ONE OF ONE LYMPH NODES NEGATIVE FOR CARCINOMA (0/1). 5. Lymph node, sentinel, biopsy, Right Axillary #2 - ONE OF ONE LYMPH NODES NEGATIVE FOR CARCINOMA (0/1). 6. Lymph node, sentinel, biopsy, Right Axillary #3 - ONE OF ONE LYMPH NODES NEGATIVE FOR CARCINOMA (0/1). 7. Breast, excision, Right additional Lateral Inferior Margins - BENIGN BREAST TISSUE. - NO MALIGNANCY IDENTIFIED. Microscopic Comment 1. BREAST, INVASIVE TUMOR Procedure: Right breast lumpectomy with additional lateral inferior margin. Right axillary sentinel lymph node biopsies.  Receptor Status: ER(90% +), PR (30% +), Her2-neu (neg), Ki-(3%)  Diagnosis 04-23-17 1. Breast, left, needle core biopsy, (A) 2:00 o'clock mass, 8CMFN - FIBROADENOMA. - THERE IS NO EVIDENCE OF MALIGNANCY. - SEE COMMENT. 2. Breast, left, needle core biopsy, (B) 2:00 o'clock mass, 9CMFN - FIBROCYSTIC CHANGES. - THERE IS NO EVIDENCE OF MALIGNANCY. - SEE COMMENT. Microscopic Comment 1. and 2. The results were called to [facility] on 04/24/17. (JBK:gt, 04/24/17) Anna Cutter MD Pathologist, Electronic Signature (Case signed 04/24/2017) Specimen Gross and Clinical Information Specimen Comment 1. (A) Time in formalin: 4:87 PM; 46 year old female with new right breast  cancer - 2 masses left breast @ 2:00 o'clock, seen on MRI & 2nd look [place] 2. (B) Time in formalin: 1:14 PM; 46 year old female with new right breast cancer - 2 masses left breast @ 2:00 o'clock, seen on MRI & 2nd look [place] Specimen(s) Obtained: 1. Breast, left, needle core biopsy, (A) 2:00 o'clock mass, 8CMFN 2. Breast, left, needle core biopsy, (B) 2:00 o'clock mass, 9CMFN Gross 1. Received in formalin, labeled with patients name and site, Anna Henson, Left Breast 2:00 (TIF 14:35 CIT < N/A min), are 3 cores of soft, tan-red tissue which measure 0.4 x 0.4 x 0.3 cm and up to 2.0 x 0.2 x 0.2 cm. Submitted in toto in 1 block(s). (md) 1 of 2 FINAL for Anna Henson, Anna Henson [phone]) Gross(continued) 2. Received in formalin, labeled with patients name and site, Anna Henson, Left Breast 2:00 (TIF 14:44 CIT < N/A min), are 4 cores of soft, tan-red tissue which measure 0.2 x 0.2 x 0.2 cm and up to 1.9 x 0.2 x 0.2 cm. Submitted in toto in 1 block(s). (md) Report signed out from the following location(s) Technical Component was performed at [facility]. [address],[place] [address].WYOV:[id number]., Interpretation was performed at [facility] [address], [place], [place] [address]. CLIA #: [id number]  Diagnosis 03-26-17     REASON FOR ADDENDUM, AMENDMENT OR CORRECTION: [id number]: E-Cadherin immunohistochemistry results. 03/28/17 09:20:38 AM (gt) Breast, right, needle core biopsy, mass @ 8:00 o'clock 7 cm fn - INVASIVE MAMMARY CARCINOMA. - SEE MICROSCOPIC DESCRIPTION. Microscopic Comment E-cadherin and breast prognostic profile will be performed. Dr. Orene Desanctis agrees. Called to [employer] on 03/27/17. (JDP:ah 03/27/17) ADDENDUM: Immunohistochemistry for E-Cadherin is performed and the tumor is  negative consistent with lobular carcinoma. (JDP:gt, 03/28/17)  Receptor Status: ER(85% +), PR (100% +), Her2-neu (neg),  Ki-(2%)  Specimen Gross and Clinical Information   Did patient present with symptoms (if so, please note symptoms) or was this found on screening mammography?: Diagnostic mammogram and ultrasound on 03/22/2017 after her doctor felt palpable area in the right breast lower outer. quadrant  Past/Anticipated interventions by surgeon, if any: Dr. Eddie Dibbles Toth,06-21-17  Breast, lumpectomy, Right w/seed, Left breast Biopsy,Right breast Biopsy 07-10-17 Had her follow up with Dr Marlou Starks she got a good report will see again in 6 months.  Past/Anticipated interventions by medical oncology, if any: Chemotherapy  Dr. Lindi Adie Genetic testing,Oncotype DX testing, adjuvant radiation, Followed by adjuvant antiestrogen therapy with tamoxifen 20 mg daily 10 years  Lymphedema issues, if any:No  Pain issues, if any:   SAFETY ISSUES:  Prior radiation? : No  Pacemaker/ICD? :No  Possible current pregnancy? :No  Is the patient on methotrexate? :No Menarche 16   G0 P0    BC No   Menopause N/A   HRT N/A  Current Complaints / other details: 46 y.o.single woman family history of lung cancer father and stomach cancer paternal grandmother   Wt Readings from Last 3 Encounters:  07/25/17 137 lb 3.2 oz (62.2 kg)  06/28/17 144 lb 12.8 oz (65.7 kg)  06/21/17 137 lb (62.1 kg)  BP (!) 162/85   Pulse 65   Temp 98.4 F (36.9 C) (Oral)   Resp 18   Ht '5\' 2"'  (1.575 m)   Wt 137 lb 3.2 oz (62.2 kg)   LMP 07/13/2017   SpO2 100%   BMI 25.09 kg/m   Anna Spurling, RN 07/19/2017,3:08 PM

## 2017-07-25 ENCOUNTER — Ambulatory Visit
Admission: RE | Admit: 2017-07-25 | Discharge: 2017-07-25 | Disposition: A | Discharge status: Home / Self Care | Payer: Federal, State, Local not specified - PPO | Source: Ambulatory Visit | Attending: Radiation Oncology | Admitting: Radiation Oncology

## 2017-07-25 ENCOUNTER — Encounter: Payer: Self-pay | Admitting: Radiation Oncology

## 2017-07-25 VITALS — BP 162/85 | HR 65 | Temp 98.4°F | Resp 18 | Ht 62.0 in | Wt 137.2 lb

## 2017-07-25 DIAGNOSIS — C50511 Malignant neoplasm of lower-outer quadrant of right female breast: Secondary | ICD-10-CM | POA: Insufficient documentation

## 2017-07-25 DIAGNOSIS — Z170 Estrogen receptor positive status [ER+]: Principal | ICD-10-CM

## 2017-07-25 NOTE — Addendum Note (Signed)
Encounter addended by: Malena Edman, RN on: 07/25/2017  9:42 AM<BR>    Actions taken: Charge Capture section accepted

## 2017-07-25 NOTE — Progress Notes (Signed)
Radiation Oncology         [phone] ________________________________  Name: Anna Henson MRN: [id number]  Date: 07/25/2017  DOB: 1971-01-22  CB:JSEGB, Anna Butters, MD  Anna Lose, MD     REFERRING PHYSICIAN: Nicholas Lose, MD   DIAGNOSIS:   The encounter diagnosis was Malignant neoplasm of lower-outer quadrant of right breast of female, estrogen receptor positive ([place]).   HISTORY OF PRESENT ILLNESS: Anna Henson is a 46 y.o. female who was originally seen in the multidisciplinary breast clinic for a diagnosis of right breast cancer. Her PCP noted a palpable mass in the right breast and presented for diagnostic mammogram and ultrasound on 03/22/2017. Diagnostic imaging showed a 1.3 cm oval mass with a lobulated margin in the right breast at 8 o'clock middle depth. No abnormalities were seen sonographically in the right axilla.  Biopsy of the right breast mass at 8:00 o'clock position 7 cm from the nipple performed on 03/26/2017 revealed invasive lobular carcinoma, ER 85% / PR 100% / HER-2 negative / Ki-67 2%. She underwent MRI revealing 4 grouped masses in the  left breast 4 mm, 56m, 6 mm, and 7 mm, and in the right breast measuring 1.3 x .8 x .7 cm. A biopsy on 04/24/17 of the two of the left lesions were negative for malignancy and revealed fibrocystic changes. 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 Her margins were negative as well as her nodes. Her oncotype is 16, and she is not planning on systemic therapy. She comes today to discuss options of adjuvant radiotherapy.  PREVIOUS RADIATION THERAPY: No   PAST MEDICAL HISTORY:  Past Medical History:  Diagnosis Date  . Anxiety   . Bipolar disorder ([place]   . Breast cancer, right breast ([place] ~ 03/2017  . CAD in native artery    a. 09/2014 Cath/PCI in setting of [place]- s/p 2.25 x 12 mm Promus Premier DES to the RCA;  b.  11/2016 Myoview: EF 56%, no ischemia;  c. 12/2016 NSTEMI/PCI: LM nl, LAd 25p, LCX 30p, RCA 100p (2.75x38 Promus Premier DES), mRCA 10 ISR, EF 50-55%.  . Chronic kidney disease    she sthinks has stage 2 CKD, has had [place] and has some stenosis , [place]  . CKD (chronic kidney disease), stage II   . Depression   . Dysrhythmia    Tachycardia in evenings  . Genetic testing 04/23/2017   Ms. Pillars underwent genetic counseling and testing for hereditary cancer syndromes on 04/05/2017. Her results were negative for mutations in all 46 genes analyzed by [facility]'s 46-gene Common Hereditary Cancers Panel. Genes analyzed include: APC, ATM, AXIN2, BARD1, BMPR1A, BRCA1, BRCA2, BRIP1, CDH1, CDKN2A, CHEK2, CTNNA1, DICER1, EPCAM, GREM1, HOXB13, KIT, MEN1, MLH1, MSH2, MSH3, MSH6, MUTYH, NBN,  . GERD (gastroesophageal reflux disease)   . Headache    "bi-weekly" (05/07/2017)  . Heart attack ([place] 12/31/2016  . HOCM (hypertrophic obstructive cardiomyopathy) ([place]    a. 12/2016 Echo: EF 65-70%,  mod conc LVH, dynamic obstruction @ rest, peak velocity of 291 cm/sec w/ peak gradient of 363mg, no rwma, Gr1 DD, triv TR, PASP 155m.  . HMarland Kitchenperlipidemia   . Hypertension    > 5 years  . Migraine    "probably 1/month" (05/07/2017)  . Palpitations   . Pneumonia    "twice when I was a child" (05/07/2017)  . Tobacco abuse 09/13/2014  .  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 ABDOMINAL HERNIA REPAIR  ~ 2001; ~ 2005  . BREAST BIOPSY Right ~ 03/2017 X 2  . BREAST LUMPECTOMY WITH RADIOACTIVE SEED AND SENTINEL LYMPH NODE BIOPSY Right 06/21/2017   Procedure: RIGHT BREAST BRACKETED SEED GUIDED LUMPECTOMY AND SENTINEL LYMPH NODE BIOPSY;  Surgeon: Jovita Kussmaul, MD;  Location: [facility];  Service: General;  Laterality: Right;  . CORONARY ANGIOPLASTY WITH STENT PLACEMENT     L main OK, LAD OK, CFX 30%, RCA 90%-0% w/ 2.25 x 12 mm Promus  Premier DES, EF 55%  . CORONARY/GRAFT ACUTE MI REVASCULARIZATION N/A 12/31/2016   Procedure: Coronary/Graft Acute MI Revascularization;  Surgeon: Sherren Mocha, MD;  Location: [facility];  Service: Cardiovascular;  Laterality: N/A;  . HERNIA REPAIR     times 2  umbilical  . LEFT HEART CATH AND CORONARY ANGIOGRAPHY N/A 12/31/2016   Procedure: Left Heart Cath and Coronary Angiography;  Surgeon: Sherren Mocha, MD;  Location: [facility];  Service: Cardiovascular;  Laterality: N/A;  . LEFT HEART CATHETERIZATION WITH CORONARY ANGIOGRAM N/A 09/14/2014   Procedure: LEFT HEART CATHETERIZATION WITH CORONARY ANGIOGRAM;  Surgeon: Burnell Blanks, MD;  Location: [facility] CATH LAB;  Service: Cardiovascular;  Laterality: N/A;  . PERCUTANEOUS CORONARY STENT INTERVENTION (PCI-S)  09/14/2014   Procedure: PERCUTANEOUS CORONARY STENT INTERVENTION (PCI-S);  Surgeon: Burnell Blanks, MD;  Location: [facility] CATH LAB;  Service: Cardiovascular;;  . TONSILLECTOMY  2005     FAMILY HISTORY:  Family History  Problem Relation Age of Onset  . Diabetes Mother   . Heart disease Mother   . Hyperlipidemia Mother   . Hypertension Mother   . Lung cancer Father   . Drug abuse Father   . Brain cancer Father 29  . Bone cancer Father 101  . Drug abuse Sister   . Mental illness Brother   . Mental illness Maternal Grandmother   . Stomach cancer Paternal Grandmother 58       d.75     SOCIAL HISTORY:  reports that she quit smoking about 21 months ago. Her smoking use included Cigarettes. She has a 15.00 pack-year smoking history. She has never used smokeless tobacco. She reports that she drinks about 1.2 oz of alcohol per week . She reports that she uses drugs, including Marijuana. The patient is single and lives in [place]. She works remotely for the [employer].   ALLERGIES: Patient has no known allergies.   MEDICATIONS:  Current Outpatient Prescriptions  Medication Sig Dispense Refill  . acetaminophen  (TYLENOL) 325 MG tablet Take 650 mg by mouth every 6 (six) hours as needed for mild pain or headache.    Marland Kitchen amLODipine (NORVASC) 5 MG tablet Take 1 tablet (5 mg total) by mouth daily. 30 tablet 11  . ARIPiprazole (ABILIFY) 5 MG tablet Take 1 tablet (5 mg total) by mouth daily. 90 tablet 0  . aspirin 81 MG chewable tablet Chew 1 tablet (81 mg total) by mouth daily.    Marland Kitchen atorvastatin (LIPITOR) 80 MG tablet Take 1 tablet (80 mg total) by mouth daily. (Patient taking differently: Take 80 mg by mouth daily at 6 PM. ) 90 tablet 0  . baclofen (LIORESAL) 10 MG tablet Take up to 4/day for muscle spasms (Patient taking differently: Take 5-10 mg by mouth 4 (four) times daily as needed for muscle spasms (1 tablet in the morning and  1 inth eevenining and 2 at bedtime). ) 120 each 11  . fluticasone (FLONASE) 50 MCG/ACT nasal spray SHAKE LIQUID AND USE 2 SPRAYS IN EACH NOSTRIL DAILY (Patient taking differently: SHAKE LIQUID AND USE 2 SPRAYS IN EACH NOSTRIL DAILY as need rhinitis) 16 g 5  . folic acid (FOLVITE) 1 MG tablet Take 1 mg by mouth daily.    . furosemide (LASIX) 20 MG tablet TAKE 1 TABLET(20 MG) BY MOUTH DAILY AS NEEDED (Patient taking differently: TAKE 1 TABLET(20 MG) BY MOUTH DAILY AS NEEDED FOR FLUID RETENTION) 10 tablet 0  . gabapentin (NEURONTIN) 300 MG capsule Take one pill in the morning, two in the afternoon and two po at bedtime (Patient taking differently: Take 300-600 mg by mouth See admin instructions. Take 1 capsule every morning then take 2 capsules in the afternoon and at 2 at bedtime) 150 capsule 11  . HYDROcodone-acetaminophen (NORCO/VICODIN) 5-325 MG tablet Take 1-2 tablets by mouth every 4 (four) hours as needed for moderate pain or severe pain. 20 tablet 0  . hydrOXYzine (VISTARIL) 25 MG capsule Take 1-2 capsule at bed time for anxiety and insomnia (Patient taking differently: Take 25-50 mg by mouth at bedtime as needed (1-2 depending on anxiety level). Take 1-2 capsule at bed time for  anxiety and insomnia) 60 capsule 1  . lisinopril (PRINIVIL,ZESTRIL) 10 MG tablet Take 20 mg every morning and 67m every evening (Patient taking differently: Take 20 mg by mouth 2 (two) times daily. ) 90 tablet 0  . metoprolol (LOPRESSOR) 50 MG tablet Take 0.5 tablets (25 mg total) by mouth 2 (two) times daily. TAKE 1 TABLET BY MOUTH TWICE DAILY. (Patient taking differently: Take 25 mg by mouth 2 (two) times daily. ) 90 tablet 1  . nitroGLYCERIN (NITROSTAT) 0.4 MG SL tablet Place 1 tablet (0.4 mg total) under the tongue every 5 (five) minutes as needed for chest pain. 25 tablet 1  . Omega-3 Fatty Acids (OMEGA 3 PO) Take 2 capsules by mouth daily.    . pantoprazole (PROTONIX) 40 MG tablet Take 1 tablet (40 mg total) by mouth 2 (two) times daily. 30 tablet 6  . Probiotic Product (PROBIOTIC PO) Take 1 tablet by mouth daily.    . tamoxifen (NOLVADEX) 20 MG tablet Take 1 tablet (20 mg total) by mouth daily. 90 tablet 3  . traMADol (ULTRAM) 50 MG tablet Take 1 tablet (50 mg total) by mouth 4 (four) times daily as needed. (Patient taking differently: Take 50-100 mg by mouth every 12 (twelve) hours as needed for moderate pain or severe pain (depends on pain level if takes 1-2 tablets). ) 120 tablet 4   No current facility-administered medications for this encounter.    Facility-Administered Medications Ordered in Other Encounters  Medication Dose Route Frequency Provider Last Rate Last Dose  . gadopentetate dimeglumine (MAGNEVIST) injection 16 mL  16 mL Intravenous Once PRN Sater, RNanine Means MD        REVIEW OF SYSTEMS: On review of systems, the patient reports that she is doing well overall. She  denies any chest pain, shortness of breath, cough, fevers, chills, night sweats, unintended weight changes. She did have some postop fluid changes in the right breast and this has resolved. denies any bowel or bladder disturbances, and denies abdominal pain, nausea or vomiting. She denies any new musculoskeletal  or joint aches or pains, new skin lesions or concerns. A complete review of systems is obtained and is otherwise negative.     PHYSICAL EXAM:  Wt Readings from Last 3 Encounters:  06/28/17 144 lb 12.8 oz (65.7 kg)  06/21/17 137 lb (62.1 kg)  06/19/17 137 lb 2 oz (62.2 kg)   Temp Readings from Last 3 Encounters:  06/28/17 98.4 F (36.9 C) (Oral)  06/21/17 97.6 F (36.4 C)  06/19/17 98.7 F (37.1 C) (Oral)   BP Readings from Last 3 Encounters:  06/28/17 (!) 145/78  06/21/17 (!) 159/91  06/19/17 (!) 162/85   Pulse Readings from Last 3 Encounters:  06/28/17 78  06/21/17 64  06/19/17 63    /10  In general this is a well appearing African-American female in no acute distress. She is alert and oriented x4 and appropriate throughout the examination. HEENT reveals that the patient is normocephalic, atraumatic. EOMs are intact. PERRLA. Skin is intact without any evidence of gross lesions. Cardiopulmonary assessment is negative for acute distress and she exhibits normal effort. The right breast is examined and reveals no erythema and the incision site is intact. No chest wall or RUE edema is noted.  ECOG = 0  0 - Asymptomatic (Fully active, able to carry on all predisease activities without restriction)  1 - Symptomatic but completely ambulatory (Restricted in physically strenuous activity but ambulatory and able to carry out work of a light or sedentary nature. For example, light housework, office work)  2 - Symptomatic, <50% in bed during the day (Ambulatory and capable of all self care but unable to carry out any work activities. Up and about more than 50% of waking hours)  3 - Symptomatic, >50% in bed, but not bedbound (Capable of only limited self-care, confined to bed or chair 50% or more of waking hours)  4 - Bedbound (Completely disabled. Cannot carry on any self-care. Totally confined to bed or chair)  5 - Death   Eustace Pen MM, Creech RH, Tormey DC, et al. [phone]). "Toxicity and  response criteria of the [facility]". [place] Oncol. 5 (6): 649-55    LABORATORY DATA:  Lab Results  Component Value Date   WBC 11.1 (H) 06/19/2017   HGB 9.9 (L) 06/19/2017   HCT 30.2 (L) 06/19/2017   MCV 86.0 06/19/2017   PLT 429 (H) 06/19/2017   Lab Results  Component Value Date   NA 141 06/20/2017   K 3.9 06/20/2017   CL 105 06/20/2017   CO2 23 06/20/2017   Lab Results  Component Value Date   ALT 14 06/19/2017   AST 19 06/19/2017   ALKPHOS 41 06/19/2017   BILITOT 0.2 (L) 06/19/2017      RADIOGRAPHY: No results found.     IMPRESSION/PLAN: 1. 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 Dr. Lisbeth Renshaw discusses the pathology findings and reviews the nature of invasive breast disease. She has done well since surgery and is not planning on systemic therapy. She is ready to begin external radiotherapy to the breast followed by antiestrogen therapy. We discussed the risks, benefits, short, and long term effects of radiotherapy, and the patient is interested in proceeding. Dr. Lisbeth Renshaw discusses the delivery and logistics of radiotherapy and anticipates a course of 6 1/2 weeks. We will see her back tomorrow for simulation. Written consent is obtained and placed in the chart, a copy was provided to the patient.  In a visit lasting 25 minutes, greater than 50% of the time was spent face to face discussing her case, and coordinating the patient's care.  The above documentation reflects  my direct findings during this shared patient visit. Please see the separate note by Dr. Lisbeth Renshaw on this date for the remainder of the patient's plan of care.     Carola Rhine, PAC

## 2017-07-26 ENCOUNTER — Ambulatory Visit
Admission: RE | Admit: 2017-07-26 | Discharge: 2017-07-26 | Disposition: A | Discharge status: Home / Self Care | Payer: Federal, State, Local not specified - PPO | Source: Ambulatory Visit | Attending: Radiation Oncology | Admitting: Radiation Oncology

## 2017-07-26 DIAGNOSIS — C50511 Malignant neoplasm of lower-outer quadrant of right female breast: Secondary | ICD-10-CM | POA: Diagnosis not present

## 2017-07-31 ENCOUNTER — Other Ambulatory Visit: Payer: Self-pay | Admitting: Internal Medicine

## 2017-07-31 MED ORDER — METOPROLOL TARTRATE 50 MG PO TABS: 25.0000 mg | ORAL_TABLET | Freq: Two times a day (BID) | ORAL | 3 refills | Status: DC

## 2017-07-31 NOTE — Telephone Encounter (Signed)
New message   Pt verbalized that she want Dr.Hilty to write her a new prescription for metoprolol   Another physician wrote the prescription for her and she no longer sees that provider.

## 2017-07-31 NOTE — Telephone Encounter (Signed)
Rx(s) sent to pharmacy electronically. Called patient to verify dose, freq, pharmacy Was previously Rx'ed by Angelica Ran, NP

## 2017-08-01 DIAGNOSIS — C50511 Malignant neoplasm of lower-outer quadrant of right female breast: Secondary | ICD-10-CM | POA: Diagnosis not present

## 2017-08-03 ENCOUNTER — Ambulatory Visit (INDEPENDENT_AMBULATORY_CARE_PROVIDER_SITE_OTHER): Payer: Federal, State, Local not specified - PPO | Admitting: Pharmacist Clinician (PhC)/ Clinical Pharmacy Specialist

## 2017-08-03 DIAGNOSIS — I10 Essential (primary) hypertension: Secondary | ICD-10-CM | POA: Diagnosis not present

## 2017-08-03 MED ORDER — LISINOPRIL 20 MG PO TABS: 20.0000 mg | ORAL_TABLET | Freq: Two times a day (BID) | ORAL | 3 refills | Status: DC

## 2017-08-03 NOTE — Assessment & Plan Note (Signed)
Blood pressure well controlled in office today.  Patient will continue to monitor home readings 2-3 times per week and knows to call the office should she see multiple readings >367 systolic.  We can follow up with her as needed in the future.

## 2017-08-03 NOTE — Patient Instructions (Addendum)
  Your blood pressure today is 116/68  Check your blood pressure at home 2-3 times per week and keep record of the readings.  Take your BP meds as follows:  Continue all current medications  Bring all of your meds, your BP cuff and your record of home blood pressures to your next appointment.  Exercise as you're able, try to walk approximately 30 minutes per day.  Keep salt intake to a minimum, especially watch canned and prepared boxed foods.  Eat more fresh fruits and vegetables and fewer canned items.  Avoid eating in fast food restaurants.    HOW TO TAKE YOUR BLOOD PRESSURE: . Rest 5 minutes before taking your blood pressure. .  Don't smoke or drink caffeinated beverages for at least 30 minutes before. . Take your blood pressure before (not after) you eat. . Sit comfortably with your back supported and both feet on the floor (don't cross your legs). . Elevate your arm to heart level on a table or a desk. . Use the proper sized cuff. It should fit smoothly and snugly around your bare upper arm. There should be enough room to slip a fingertip under the cuff. The bottom edge of the cuff should be 1 inch above the crease of the elbow. . Ideally, take 3 measurements at one sitting and record the average.

## 2017-08-03 NOTE — Progress Notes (Signed)
Patient ID: Anna Henson                 DOB: 08-28-1971                      MRN: [id number]     HPI: [place] Giammona is a 46 y.o. female referred by Dr. Debara Pickett to HTN clinic for a follow up visit. PMH includes NSTEMI  s/p PCI in 12/2016,  CAD s/p stent placement in 09/2014, CKD stage 2, hyperlipidemia, hypertension,  bipolar disorder and newly diagnosed breast cancer.  Since her last visit she had surgery to remove the breast cancer and will start radiation treatments next week.  Much of her recent rise in BP could have been due to start of tamoxifen as well as some anxiety about the surgery.  Now that she is past surgery she is feeling much better, although still some nervousness about starting radiation treatments.  Her lisinopril dose has been increased twice, from 10 mg bid to 20 am/10 pm and currently she takes 20 mg bid.    Current HTN meds:  Lisinopril 87m bid Amlodipine 5 mg qd - am Furosemide 247mdaily PRN edema (has not taken in past couple of weeks) Metoprolol 2541mwice daily  Previously tried:  Triamterene.HCTZ 37.5/25mg daily  BP goal: <130/80  Family History: all siblings with hypertension   Mother with hypertension, still living at 75 69iet: currently avoiding all high sodium foods (mostly home cooked), avoids boxed foods and keeps fried foods to a minimum  Home BP readings: no readings from home in the past month  Wt Readings from Last 3 Encounters:  08/03/17 137 lb (62.1 kg)  07/25/17 137 lb 3.2 oz (62.2 kg)  06/28/17 144 lb 12.8 oz (65.7 kg)   BP Readings from Last 3 Encounters:  08/03/17 116/68  07/25/17 (!) 162/85  06/28/17 (!) 145/78   Pulse Readings from Last 3 Encounters:  08/03/17 64  07/25/17 65  06/28/17 78    Renal function: CrCl cannot be calculated (Patient's most recent lab result is older than the maximum 21 days allowed.).  Past Medical History:  Diagnosis Date  . Anxiety   . Bipolar disorder (HCC[place]idd[place] . Breast cancer, right breast  ([place] 03/2017  . CAD in native artery    a. 09/2014 Cath/PCI in setting of [place]/p 2.25 x 12 mm Promus Premier DES to the RCA;  b. 11/2016 Myoview: EF 56%, no ischemia;  c. 12/2016 NSTEMI/PCI: LM nl, LAd 25p, LCX 30p, RCA 100p (2.75x38 Promus Premier DES), mRCA 10 ISR, EF 50-55%.  . Chronic kidney disease    she sthinks has stage 2 CKD, has had US [place] and has some stenosis , [place]. CKD (chronic kidney disease), stage II   . Depression   . Dysrhythmia    Tachycardia in evenings  . Genetic testing 04/23/2017   Ms. Lenz underwent genetic counseling and testing for hereditary cancer syndromes on 04/05/2017. Her results were negative for mutations in all 46 genes analyzed by [facility]'s 46-gene Common Hereditary Cancers Panel. Genes analyzed include: APC, ATM, AXIN2, BARD1, BMPR1A, BRCA1, BRCA2, BRIP1, CDH1, CDKN2A, CHEK2, CTNNA1, DICER1, EPCAM, GREM1, HOXB13, KIT, MEN1, MLH1, MSH2, MSH3, MSH6, MUTYH, NBN,  . GERD (gastroesophageal reflux disease)   . Headache    "bi-weekly" (05/07/2017)  . Heart attack ([place]CC[place]3/25/2018  . HOCM (hypertrophic obstructive cardiomyopathy) ([place]  a. 12/2016 Echo: EF  65-70%,  mod conc LVH, dynamic obstruction @ rest, peak velocity of 291 cm/sec w/ peak gradient of 14mHg, no rwma, Gr1 DD, triv TR, PASP 142mg.  . Marland Kitchenyperlipidemia   . Hypertension    > 5 years  . Migraine    "probably 1/month" (05/07/2017)  . Palpitations   . Pneumonia    "twice when I was a child" (05/07/2017)  . Tobacco abuse 09/13/2014  . Transverse myelitis ([place]CColstrip2017    Current Outpatient Prescriptions on File Prior to Visit  Medication Sig Dispense Refill  . acetaminophen (TYLENOL) 325 MG tablet Take 650 mg by mouth every 6 (six) hours as needed for mild pain or headache.    . Marland KitchenmLODipine (NORVASC) 5 MG tablet Take 1 tablet (5 mg total) by mouth daily. 30 tablet 11  . ARIPiprazole (ABILIFY) 5 MG tablet Take 1 tablet (5 mg total) by mouth daily. 90 tablet 0  .  aspirin 81 MG chewable tablet Chew 1 tablet (81 mg total) by mouth daily.    . Marland Kitchentorvastatin (LIPITOR) 80 MG tablet Take 1 tablet (80 mg total) by mouth daily. (Patient taking differently: Take 80 mg by mouth daily at 6 PM. ) 90 tablet 0  . baclofen (LIORESAL) 10 MG tablet Take up to 4/day for muscle spasms (Patient taking differently: Take 5-10 mg by mouth 4 (four) times daily as needed for muscle spasms (1 tablet in the morning and 1 inth eevenining and 2 at bedtime). ) 120 each 11  . fluticasone (FLONASE) 50 MCG/ACT nasal spray SHAKE LIQUID AND USE 2 SPRAYS IN EACH NOSTRIL DAILY (Patient taking differently: SHAKE LIQUID AND USE 2 SPRAYS IN EACH NOSTRIL DAILY as need rhinitis) 16 g 5  . folic acid (FOLVITE) 1 MG tablet Take 1 mg by mouth daily.    . furosemide (LASIX) 20 MG tablet TAKE 1 TABLET(20 MG) BY MOUTH DAILY AS NEEDED (Patient taking differently: TAKE 1 TABLET(20 MG) BY MOUTH DAILY AS NEEDED FOR FLUID RETENTION) 10 tablet 0  . gabapentin (NEURONTIN) 300 MG capsule Take one pill in the morning, two in the afternoon and two po at bedtime (Patient taking differently: Take 300-600 mg by mouth See admin instructions. Take 1 capsule every morning then take 2 capsules in the afternoon and at 2 at bedtime) 150 capsule 11  . hydrOXYzine (VISTARIL) 25 MG capsule Take 1-2 capsule at bed time for anxiety and insomnia (Patient taking differently: Take 25-50 mg by mouth at bedtime as needed (1-2 depending on anxiety level). Take 1-2 capsule at bed time for anxiety and insomnia) 60 capsule 1  . metoprolol tartrate (LOPRESSOR) 50 MG tablet Take 0.5 tablets (25 mg total) by mouth 2 (two) times daily. 90 tablet 3  . nitroGLYCERIN (NITROSTAT) 0.4 MG SL tablet Place 1 tablet (0.4 mg total) under the tongue every 5 (five) minutes as needed for chest pain. 25 tablet 1  . Omega-3 Fatty Acids (OMEGA 3 PO) Take 2 capsules by mouth daily.    . pantoprazole (PROTONIX) 40 MG tablet Take 1 tablet (40 mg total) by mouth 2  (two) times daily. 30 tablet 6  . Probiotic Product (PROBIOTIC PO) Take 1 tablet by mouth daily.    . tamoxifen (NOLVADEX) 20 MG tablet Take 1 tablet (20 mg total) by mouth daily. 90 tablet 3  . traMADol (ULTRAM) 50 MG tablet Take 1 tablet (50 mg total) by mouth 4 (four) times daily as needed. (Patient taking differently: Take 50-100 mg by mouth every 12 (twelve) hours as  needed for moderate pain or severe pain (depends on pain level if takes 1-2 tablets). ) 120 tablet 4   Current Facility-Administered Medications on File Prior to Visit  Medication Dose Route Frequency Provider Last Rate Last Dose  . gadopentetate dimeglumine (MAGNEVIST) injection 16 mL  16 mL Intravenous Once PRN Sater, Nanine Means, MD        No Known Allergies  Blood pressure 116/68, pulse 64, height 5' 1.5" (1.562 m), weight 137 lb (62.1 kg), last menstrual period 07/13/2017.  Essential hypertension Blood pressure well controlled in office today.  Patient will continue to monitor home readings 2-3 times per week and knows to call the office should she see multiple readings >195 systolic.  We can follow up with her as needed in the future.     Tommy Medal PharmD CPP [facility] [address] [place] [address] 08/03/2017 2:52 PM

## 2017-08-06 ENCOUNTER — Ambulatory Visit
Admission: RE | Admit: 2017-08-06 | Discharge: 2017-08-06 | Disposition: A | Discharge status: Home / Self Care | Payer: Federal, State, Local not specified - PPO | Source: Ambulatory Visit | Attending: Radiation Oncology | Admitting: Radiation Oncology

## 2017-08-06 DIAGNOSIS — C50511 Malignant neoplasm of lower-outer quadrant of right female breast: Secondary | ICD-10-CM | POA: Diagnosis not present

## 2017-08-07 ENCOUNTER — Ambulatory Visit: Payer: Federal, State, Local not specified - PPO | Admitting: Internal Medicine

## 2017-08-07 ENCOUNTER — Ambulatory Visit
Admission: RE | Admit: 2017-08-07 | Discharge: 2017-08-07 | Disposition: A | Discharge status: Home / Self Care | Payer: Federal, State, Local not specified - PPO | Source: Ambulatory Visit | Attending: Radiation Oncology | Admitting: Radiation Oncology

## 2017-08-07 ENCOUNTER — Ambulatory Visit: Payer: Federal, State, Local not specified - PPO

## 2017-08-07 DIAGNOSIS — C50511 Malignant neoplasm of lower-outer quadrant of right female breast: Secondary | ICD-10-CM | POA: Diagnosis not present

## 2017-08-08 ENCOUNTER — Ambulatory Visit: Payer: Federal, State, Local not specified - PPO

## 2017-08-08 ENCOUNTER — Ambulatory Visit
Admission: RE | Admit: 2017-08-08 | Discharge: 2017-08-08 | Disposition: A | Discharge status: Home / Self Care | Payer: Federal, State, Local not specified - PPO | Source: Ambulatory Visit | Attending: Radiation Oncology | Admitting: Radiation Oncology

## 2017-08-08 DIAGNOSIS — C50511 Malignant neoplasm of lower-outer quadrant of right female breast: Secondary | ICD-10-CM

## 2017-08-08 DIAGNOSIS — Z170 Estrogen receptor positive status [ER+]: Principal | ICD-10-CM

## 2017-08-08 MED ORDER — ALRA NON-METALLIC DEODORANT (RAD-ONC)
1.0000 "application " | Freq: Once | TOPICAL | Status: AC
  Administered 2017-08-08: 1 via TOPICAL

## 2017-08-08 MED ORDER — RADIAPLEXRX EX GEL
Freq: Once | CUTANEOUS | Status: AC
  Administered 2017-08-08: 16:00:00 via TOPICAL

## 2017-08-08 NOTE — Progress Notes (Signed)
Pt here for patient teaching.  Pt given Radiation and You booklet, skin care instructions, Alra deodorant and Radiaplex gel.  Reviewed areas of pertinence such as fatigue, hair loss, skin changes and breast swelling . Pt able to give teach back of to pat skin and use unscented/gentle soap,apply Radiaplex bid, avoid applying anything to skin within 4 hours of treatment, avoid wearing an under wire bra and to use an electric razor if they must shave. Pt demonstrated understanding of information given and will contact nursing with any questions or concerns.     Http://rtanswers.org/treatmentinformation/whattoexpect/index

## 2017-08-09 ENCOUNTER — Ambulatory Visit
Admission: RE | Admit: 2017-08-09 | Discharge: 2017-08-09 | Disposition: A | Discharge status: Home / Self Care | Payer: Federal, State, Local not specified - PPO | Source: Ambulatory Visit | Attending: Radiation Oncology | Admitting: Radiation Oncology

## 2017-08-09 ENCOUNTER — Ambulatory Visit: Payer: Federal, State, Local not specified - PPO

## 2017-08-09 DIAGNOSIS — C50511 Malignant neoplasm of lower-outer quadrant of right female breast: Secondary | ICD-10-CM | POA: Diagnosis not present

## 2017-08-10 ENCOUNTER — Ambulatory Visit
Admission: RE | Admit: 2017-08-10 | Discharge: 2017-08-10 | Disposition: A | Discharge status: Home / Self Care | Payer: Federal, State, Local not specified - PPO | Source: Ambulatory Visit | Attending: Radiation Oncology | Admitting: Radiation Oncology

## 2017-08-10 ENCOUNTER — Ambulatory Visit: Payer: Federal, State, Local not specified - PPO

## 2017-08-10 DIAGNOSIS — C50511 Malignant neoplasm of lower-outer quadrant of right female breast: Secondary | ICD-10-CM | POA: Diagnosis not present

## 2017-08-13 ENCOUNTER — Ambulatory Visit
Admission: RE | Admit: 2017-08-13 | Discharge: 2017-08-13 | Disposition: A | Discharge status: Home / Self Care | Payer: Federal, State, Local not specified - PPO | Source: Ambulatory Visit | Attending: Radiation Oncology | Admitting: Radiation Oncology

## 2017-08-13 ENCOUNTER — Ambulatory Visit: Payer: Federal, State, Local not specified - PPO

## 2017-08-13 DIAGNOSIS — C50511 Malignant neoplasm of lower-outer quadrant of right female breast: Secondary | ICD-10-CM | POA: Diagnosis not present

## 2017-08-14 ENCOUNTER — Ambulatory Visit
Admission: RE | Admit: 2017-08-14 | Discharge: 2017-08-14 | Disposition: A | Discharge status: Home / Self Care | Payer: Federal, State, Local not specified - PPO | Source: Ambulatory Visit | Attending: Radiation Oncology | Admitting: Radiation Oncology

## 2017-08-14 ENCOUNTER — Ambulatory Visit: Payer: Federal, State, Local not specified - PPO

## 2017-08-14 DIAGNOSIS — C50511 Malignant neoplasm of lower-outer quadrant of right female breast: Secondary | ICD-10-CM | POA: Diagnosis not present

## 2017-08-15 ENCOUNTER — Ambulatory Visit
Admission: RE | Admit: 2017-08-15 | Discharge: 2017-08-15 | Disposition: A | Discharge status: Home / Self Care | Payer: Federal, State, Local not specified - PPO | Source: Ambulatory Visit | Attending: Radiation Oncology | Admitting: Radiation Oncology

## 2017-08-15 ENCOUNTER — Ambulatory Visit: Payer: Federal, State, Local not specified - PPO

## 2017-08-15 DIAGNOSIS — C50511 Malignant neoplasm of lower-outer quadrant of right female breast: Secondary | ICD-10-CM | POA: Diagnosis not present

## 2017-08-16 ENCOUNTER — Ambulatory Visit: Payer: Federal, State, Local not specified - PPO

## 2017-08-16 ENCOUNTER — Encounter (HOSPITAL_COMMUNITY): Payer: Self-pay | Admitting: Psychiatry

## 2017-08-16 ENCOUNTER — Ambulatory Visit (INDEPENDENT_AMBULATORY_CARE_PROVIDER_SITE_OTHER): Payer: Federal, State, Local not specified - PPO | Admitting: Psychiatry

## 2017-08-16 ENCOUNTER — Telehealth (HOSPITAL_COMMUNITY): Payer: Self-pay

## 2017-08-16 ENCOUNTER — Ambulatory Visit
Admission: RE | Admit: 2017-08-16 | Discharge: 2017-08-16 | Disposition: A | Discharge status: Home / Self Care | Payer: Federal, State, Local not specified - PPO | Source: Ambulatory Visit | Attending: Radiation Oncology | Admitting: Radiation Oncology

## 2017-08-16 DIAGNOSIS — R5381 Other malaise: Secondary | ICD-10-CM | POA: Diagnosis not present

## 2017-08-16 DIAGNOSIS — F1910 Other psychoactive substance abuse, uncomplicated: Secondary | ICD-10-CM

## 2017-08-16 DIAGNOSIS — F4310 Post-traumatic stress disorder, unspecified: Secondary | ICD-10-CM | POA: Diagnosis not present

## 2017-08-16 DIAGNOSIS — R450 Nervousness: Secondary | ICD-10-CM

## 2017-08-16 DIAGNOSIS — R5383 Other fatigue: Secondary | ICD-10-CM

## 2017-08-16 DIAGNOSIS — Z818 Family history of other mental and behavioral disorders: Secondary | ICD-10-CM

## 2017-08-16 DIAGNOSIS — Z87891 Personal history of nicotine dependence: Secondary | ICD-10-CM

## 2017-08-16 DIAGNOSIS — F1010 Alcohol abuse, uncomplicated: Secondary | ICD-10-CM

## 2017-08-16 DIAGNOSIS — F319 Bipolar disorder, unspecified: Secondary | ICD-10-CM

## 2017-08-16 DIAGNOSIS — C50511 Malignant neoplasm of lower-outer quadrant of right female breast: Secondary | ICD-10-CM | POA: Diagnosis not present

## 2017-08-16 DIAGNOSIS — M2550 Pain in unspecified joint: Secondary | ICD-10-CM

## 2017-08-16 DIAGNOSIS — G4700 Insomnia, unspecified: Secondary | ICD-10-CM

## 2017-08-16 DIAGNOSIS — F419 Anxiety disorder, unspecified: Secondary | ICD-10-CM | POA: Diagnosis not present

## 2017-08-16 DIAGNOSIS — Z813 Family history of other psychoactive substance abuse and dependence: Secondary | ICD-10-CM

## 2017-08-16 MED ORDER — ARIPIPRAZOLE 10 MG PO TABS: 10.0000 mg | ORAL_TABLET | Freq: Every day | ORAL | 1 refills | Status: DC

## 2017-08-16 MED ORDER — HYDROXYZINE PAMOATE 25 MG PO CAPS: ORAL_CAPSULE | ORAL | 1 refills | Status: DC

## 2017-08-16 NOTE — Telephone Encounter (Signed)
Patient is calling, she is experiencing overwhelming worry and anxiety. She has an appointment next week, but wants to know if there is anything she can take until then. Please review and advise, thank you

## 2017-08-16 NOTE — Telephone Encounter (Signed)
Patient seen today and appropriate medication adjustment done
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
## 2017-08-17 ENCOUNTER — Ambulatory Visit
Admission: RE | Admit: 2017-08-17 | Discharge: 2017-08-17 | Disposition: A | Discharge status: Home / Self Care | Payer: Federal, State, Local not specified - PPO | Source: Ambulatory Visit | Attending: Radiation Oncology | Admitting: Radiation Oncology

## 2017-08-17 ENCOUNTER — Ambulatory Visit: Payer: Federal, State, Local not specified - PPO

## 2017-08-17 DIAGNOSIS — C50511 Malignant neoplasm of lower-outer quadrant of right female breast: Secondary | ICD-10-CM | POA: Diagnosis not present

## 2017-08-20 ENCOUNTER — Ambulatory Visit: Payer: Federal, State, Local not specified - PPO

## 2017-08-20 ENCOUNTER — Ambulatory Visit
Admission: RE | Admit: 2017-08-20 | Discharge: 2017-08-20 | Disposition: A | Discharge status: Home / Self Care | Payer: Federal, State, Local not specified - PPO | Source: Ambulatory Visit | Attending: Radiation Oncology | Admitting: Radiation Oncology

## 2017-08-20 DIAGNOSIS — C50511 Malignant neoplasm of lower-outer quadrant of right female breast: Secondary | ICD-10-CM | POA: Diagnosis not present

## 2017-08-21 ENCOUNTER — Ambulatory Visit: Payer: Federal, State, Local not specified - PPO

## 2017-08-21 ENCOUNTER — Ambulatory Visit
Admission: RE | Admit: 2017-08-21 | Discharge: 2017-08-21 | Disposition: A | Discharge status: Home / Self Care | Payer: Federal, State, Local not specified - PPO | Source: Ambulatory Visit | Attending: Radiation Oncology | Admitting: Radiation Oncology

## 2017-08-21 DIAGNOSIS — C50511 Malignant neoplasm of lower-outer quadrant of right female breast: Secondary | ICD-10-CM | POA: Diagnosis not present

## 2017-08-22 ENCOUNTER — Ambulatory Visit (INDEPENDENT_AMBULATORY_CARE_PROVIDER_SITE_OTHER): Payer: Federal, State, Local not specified - PPO | Admitting: Psychiatry

## 2017-08-22 ENCOUNTER — Encounter: Payer: Self-pay | Admitting: Neurology

## 2017-08-22 ENCOUNTER — Telehealth: Payer: Self-pay | Admitting: Neurology

## 2017-08-22 ENCOUNTER — Ambulatory Visit: Payer: Federal, State, Local not specified - PPO | Admitting: Neurology

## 2017-08-22 ENCOUNTER — Encounter: Payer: Self-pay | Admitting: *Deleted

## 2017-08-22 ENCOUNTER — Encounter (HOSPITAL_COMMUNITY): Payer: Self-pay | Admitting: Psychiatry

## 2017-08-22 ENCOUNTER — Ambulatory Visit
Admission: RE | Admit: 2017-08-22 | Discharge: 2017-08-22 | Disposition: A | Discharge status: Home / Self Care | Payer: Federal, State, Local not specified - PPO | Source: Ambulatory Visit | Attending: Radiation Oncology | Admitting: Radiation Oncology

## 2017-08-22 ENCOUNTER — Ambulatory Visit: Payer: Federal, State, Local not specified - PPO

## 2017-08-22 VITALS — BP 153/88 | HR 69 | Ht 61.0 in | Wt 136.5 lb

## 2017-08-22 DIAGNOSIS — F319 Bipolar disorder, unspecified: Secondary | ICD-10-CM

## 2017-08-22 DIAGNOSIS — C50511 Malignant neoplasm of lower-outer quadrant of right female breast: Secondary | ICD-10-CM

## 2017-08-22 DIAGNOSIS — R200 Anesthesia of skin: Secondary | ICD-10-CM

## 2017-08-22 DIAGNOSIS — G373 Acute transverse myelitis in demyelinating disease of central nervous system: Secondary | ICD-10-CM

## 2017-08-22 DIAGNOSIS — Z170 Estrogen receptor positive status [ER+]: Secondary | ICD-10-CM

## 2017-08-22 DIAGNOSIS — M436 Torticollis: Secondary | ICD-10-CM

## 2017-08-22 DIAGNOSIS — M62838 Other muscle spasm: Secondary | ICD-10-CM | POA: Diagnosis not present

## 2017-08-22 DIAGNOSIS — F411 Generalized anxiety disorder: Secondary | ICD-10-CM | POA: Diagnosis not present

## 2017-08-22 DIAGNOSIS — R269 Unspecified abnormalities of gait and mobility: Secondary | ICD-10-CM

## 2017-08-22 DIAGNOSIS — R9089 Other abnormal findings on diagnostic imaging of central nervous system: Secondary | ICD-10-CM | POA: Insufficient documentation

## 2017-08-22 MED ORDER — CLONAZEPAM 1 MG PO TABS: ORAL_TABLET | ORAL | 5 refills | Status: DC

## 2017-08-22 NOTE — Telephone Encounter (Signed)
Patient is schedule to have her MRI done on 09/04/17. She informed me that that she is slightly claustrophobic and would like something to calm her nerves.

## 2017-08-22 NOTE — Telephone Encounter (Signed)
Spoke with Anna Henson.  She has Clonazepam to take--will take one of these prior to MRI/fim

## 2017-08-22 NOTE — Progress Notes (Signed)
[facility]  PATIENT: Anna Henson DOB: January 09, 1971  REFERRING DOCTOR OR PCP:  Rikki Spearing _________________________________   HISTORICAL  CHIEF COMPLAINT:  Chief Complaint  Patient presents with  . Follow-up    Patient states that she has been doing a little bit better.     HISTORY OF PRESENT ILLNESS:  Anna Henson is a 46 yo woman with a transverse myelitis in late January 2017.    Update 08/22/2017:   She had a transverse Myelitis involving the spinal cord/brain from medulla to the C5 level.    She is still  noting some neck pain during the day.    She has a rare Lhermitte sign.   When present, the pain is mostly only in her neck now.     She is taking 300 mg x 5 = 1500 mg daily.    She tolerates it well.    She has spasms in her legs, much worse at night than the day.   Spasms [facility]) are on the left more than her right (was flipped last time).   The dysesthesias in her legs are just below her knee.      Spasms affect her quality of sleep.  She goes to bed between 8 and 10 pm and gets up between 2 and 4 am and stays up.    She takes baclofen 10 mg po bid, one at night.     When drowsy, she often has large jerks.  This can occur night or day.     She has breast cancer and is getting radiation (no chemo planned).      From 03/12/2017:  Transverse myelitis: In January 2017, she had the onset of numbness on the right and was found to have an enhancing cervical spine lesion on the MRI extending from the lower medulla on the right to C5.   Symptoms improved some afterwards.   In April, she reported more numbness and repeat imaging was performed.  The 02/02/16 cervical spine shows resolution of the prior enhancing focus that was seen at the cervicomedullary junction.    Dysesthesia:   She reports a dysesthetic sensation on the right with heat and stabbing and sometimes gets a milder sensation on the left in the arm.   Gabapentin helps the pain some.    She is on  300-300-600 mg over the day.    She now rarely gets a Lhermitte sign       Spasms:  She is experiencing muscles spasms on both sides, right > left.  She gets a Programmer, applications Horse spasm in the legs, right > left.   Rarely, she gets them in both legs.   These are worse when she lays down at night.   She gets milder leg spasms in her legs > arms during the day.     She takes baclofen 20 mg at night and it helps reduce spasms some.   She wakes up without grogginess if she takes an hour before bedtime.     Tramadol helps pain but causes a hangover.   Baclofen helps better  Gait/balance/strength    She notes balance is poor at times.  She stumbles some but no recent falls.    The right leg is mildly clumsy.    She notes the right hand trembles some and feels clumsy.   She sometimes drops items and has poor handwriting.  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  She notes typing speed dropped from 65 WPM to 45 WPM since the transverse melitis.    Bladder:   She denies urinary frequency.  No incontinence.  No hesitancy.     Cognitive/mood:   She feels she is not doing as well cognitively.   She was recently diagnosed with bipolar disease and PTSD.    She is on Abilify.     Sleep:   She falls asleep well most nights but always has trouble staying asleep.   She goes to bed around 10-11 pm.  Spasms usually awakens her multiple times, usually between 2 and 5 am.    Cardiac:   She had an MI 12/2016.   She has a RCA blockage and is on Brilinta.   She wore an even monitor and she reports it showed increased HR at night.     Labwork and the lupus anticoagulant and antiphospholipid tests were normal.  Transverse myelitis history:    In late January,  she had a right sided headache and went to bed.  When she woke up, she noted numbness in the right neck, shoulder and arm.   Numbness worsened over the next fe days.   She went to her PCP an was told she might have sinusitis and Amoxicillin was prescribed.    After a couple more days, she saw another doctor and had a neck soft tissue CT scan.   That CT scan showed that she had an increase of adenoid tissue and reactive type lymph nodes.  By that time, numbness extended to the fingertips on the riht but not into her leg.   She was told to go to the emergency room for further evaluation. In the emergency room she had an MRI of the cervical spine and the brain. The MRI of the cervical spine showed a focus that extended from the lower medulla on the right down to C5 posteriorly and more medially within the spinal cord. She also had some enhancement around the level of the cervical medullary junction. She was admitted to the hospital for further treatment and testing.    She was treated with 5 days of IV Solu-Medrol. During that time, her symptoms of numbness improved.  She has not returned to baseline.    She had many tests performed including lumbar puncture for CSF analysis and a thoracic spine MRI and serology blood tests. For the most part, the evaluation was negative.  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 The enhancement does not extend up to the medulla.  Cervical spine also showed mild spinal stenosis at C3-C4 and mild to moderate spinal stenosis at C5-C6 and mild spinal stenosis at C6-C7. There is some foraminal narrowing at those levels but no definite nerve root compression.  Elsewhere the brain, there were some subcortical and deep white matter T2/FLAIR hyperintense foci but not in a pattern that would be typical for MS. The thoracic spine was normal.    I also reviewed her many laboratory tests performed while she was at [employer]. Neuromyelitis optica antibodies are negative. She did not have oligoclonal bands. ANCA, ANA, SSA/SSB, ACE and Vit B-12 were all normal or negative.  She had Epstein-Barr IgG but not IgM. CSF cell counts did not show increase in white blood cells.  REVIEW OF  SYSTEMS: Constitutional: No fevers, chills, sweats, or change in appetite.  She notes some fatigue Eyes: No visual changes, double vision,  eye pain Ear, nose and throat: No hearing loss, ear pain, nasal congestion, sore throat Cardiovascular: No chest pain, palpitations.   H/O angina, CAD with stent Respiratory: No shortness of breath at rest or with exertion.   No wheezes GastrointestinaI: No nausea, vomiting, diarrhea, abdominal pain, fecal incontinence Genitourinary: Mild urgency and frequency.  No nocturia. Musculoskeletal: No neck pain, back pain Integumentary: No rash, pruritus, skin lesions Neurological: as above Psychiatric: No depression at this time.  No anxiety Endocrine: No palpitations, diaphoresis, change in appetite, change in weigh or increased thirst Hematologic/Lymphatic: No anemia, purpura, petechiae. Allergic/Immunologic: No itchy/runny eyes, nasal congestion, recent allergic reactions, rashes  ALLERGIES: No Known Allergies  HOME MEDICATIONS:  Current Outpatient Medications:  .  acetaminophen (TYLENOL) 325 MG tablet, Take 650 mg by mouth every 6 (six) hours as needed for mild pain or headache., Disp: , Rfl:  .  amLODipine (NORVASC) 5 MG tablet, Take 1 tablet (5 mg total) by mouth daily., Disp: 30 tablet, Rfl: 11 .  ARIPiprazole (ABILIFY) 10 MG tablet, Take 1 tablet (10 mg total) daily by mouth., Disp: 30 tablet, Rfl: 1 .  aspirin 81 MG chewable tablet, Chew 1 tablet (81 mg total) by mouth daily., Disp: , Rfl:  .  atorvastatin (LIPITOR) 80 MG tablet, Take 1 tablet (80 mg total) by mouth daily. (Patient taking differently: Take 80 mg by mouth daily at 6 PM. ), Disp: 90 tablet, Rfl: 0 .  baclofen (LIORESAL) 10 MG tablet, Take up to 4/day for muscle spasms (Patient taking differently: Take 5-10 mg by mouth 4 (four) times daily as needed for muscle spasms (1 tablet in the morning and 1 inth eevenining and 2 at bedtime). ), Disp: 120 each, Rfl: 11 .  fluticasone  (FLONASE) 50 MCG/ACT nasal spray, SHAKE LIQUID AND USE 2 SPRAYS IN EACH NOSTRIL DAILY (Patient taking differently: SHAKE LIQUID AND USE 2 SPRAYS IN EACH NOSTRIL DAILY as need rhinitis), Disp: 16 g, Rfl: 5 .  folic acid (FOLVITE) 1 MG tablet, Take 1 mg by mouth daily., Disp: , Rfl:  .  furosemide (LASIX) 20 MG tablet, TAKE 1 TABLET(20 MG) BY MOUTH DAILY AS NEEDED (Patient taking differently: TAKE 1 TABLET(20 MG) BY MOUTH DAILY AS NEEDED FOR FLUID RETENTION), Disp: 10 tablet, Rfl: 0 .  gabapentin (NEURONTIN) 300 MG capsule, Take one pill in the morning, two in the afternoon and two po at bedtime (Patient taking differently: Take 300-600 mg by mouth See admin instructions. Take 1 capsule every morning then take 2 capsules in the afternoon and at 2 at bedtime), Disp: 150 capsule, Rfl: 11 .  hyaluronate sodium (RADIAPLEXRX) GEL, Apply 1 application topically 2 (two) times daily. Apply to skin area after rd txs and bedtime, nothing 4 hours prior to rad tx, Disp: , Rfl:  .  hydrOXYzine (VISTARIL) 25 MG capsule, Take 1-2 capsule at bed time for anxiety and insomnia, Disp: 60 capsule, Rfl: 1 .  lisinopril (PRINIVIL,ZESTRIL) 20 MG tablet, Take 1 tablet (20 mg total) by mouth 2 (two) times daily., Disp: 180 tablet, Rfl: 3 .  metoprolol tartrate (LOPRESSOR) 50 MG tablet, Take 0.5 tablets (25 mg total) by mouth 2 (two) times daily., Disp: 90 tablet, Rfl: 3 .  non-metallic deodorant (ALRA) MISC, Apply 1 application topically., Disp: , Rfl:  .  Omega-3 Fatty Acids (OMEGA 3 PO), Take 2 capsules by mouth daily., Disp: , Rfl:  .  pantoprazole (PROTONIX) 40 MG tablet, Take 1 tablet (40 mg total) by mouth 2 (two) times  daily., Disp: 30 tablet, Rfl: 6 .  Probiotic Product (PROBIOTIC PO), Take 1 tablet by mouth daily., Disp: , Rfl:  .  tamoxifen (NOLVADEX) 20 MG tablet, Take 1 tablet (20 mg total) by mouth daily., Disp: 90 tablet, Rfl: 3 .  traMADol (ULTRAM) 50 MG tablet, Take 1 tablet (50 mg total) by mouth 4 (four) times  daily as needed. (Patient taking differently: Take 50-100 mg by mouth every 12 (twelve) hours as needed for moderate pain or severe pain (depends on pain level if takes 1-2 tablets). ), Disp: 120 tablet, Rfl: 4 .  nitroGLYCERIN (NITROSTAT) 0.4 MG SL tablet, Place 1 tablet (0.4 mg total) under the tongue every 5 (five) minutes as needed for chest pain., Disp: 25 tablet, Rfl: 1 No current facility-administered medications for this visit.   Facility-Administered Medications Ordered in Other Visits:  .  gadopentetate dimeglumine (MAGNEVIST) injection 16 mL, 16 mL, Intravenous, Once PRN, Sater, Nanine Means, MD  PAST MEDICAL HISTORY: Past Medical History:  Diagnosis Date  . Anxiety   . Bipolar disorder ([place])   . Breast cancer, right breast ([place]) ~ 03/2017  . CAD in native artery    a. 09/2014 Cath/PCI in setting of [place] - s/p 2.25 x 12 mm Promus Premier DES to the RCA;  b. 11/2016 Myoview: EF 56%, no ischemia;  c. 12/2016 NSTEMI/PCI: LM nl, LAd 25p, LCX 30p, RCA 100p (2.75x38 Promus Premier DES), mRCA 10 ISR, EF 50-55%.  . Chronic kidney disease    she sthinks has stage 2 CKD, has had US doppler and has some stenosis , this was done in [place]   . CKD (chronic kidney disease), stage II   . Depression   . Dysrhythmia    Tachycardia in evenings  . Genetic testing 04/23/2017   Ms. Weaber underwent genetic counseling and testing for hereditary cancer syndromes on 04/05/2017. Her results were negative for mutations in all 46 genes analyzed by [facility]'s 46-gene Common Hereditary Cancers Panel. Genes analyzed include: APC, ATM, AXIN2, BARD1, BMPR1A, BRCA1, BRCA2, BRIP1, CDH1, CDKN2A, CHEK2, CTNNA1, DICER1, EPCAM, GREM1, HOXB13, KIT, MEN1, MLH1, MSH2, MSH3, MSH6, MUTYH, NBN,  . GERD (gastroesophageal reflux disease)   . Headache    "bi-weekly" (05/07/2017)  . Heart attack ([place]) 12/31/2016  . HOCM (hypertrophic obstructive cardiomyopathy) ([place])    a. 12/2016 Echo: EF 65-70%,  mod conc LVH, dynamic obstruction  @ rest, peak velocity of 291 cm/sec w/ peak gradient of 89mHg, no rwma, Gr1 DD, triv TR, PASP 180mg.  . Marland Kitchenyperlipidemia   . Hypertension    > 5 years  . Migraine    "probably 1/month" (05/07/2017)  . Palpitations   . Pneumonia    "twice when I was a child" (05/07/2017)  . Tobacco abuse 09/13/2014  . Transverse myelitis ([facility]    PAST SURGICAL HISTORY: Past Surgical History:  Procedure Laterality Date  . ABDOMINAL HERNIA REPAIR  ~ 2001; ~ 2005  . BREAST BIOPSY Right ~ 03/2017 X 2  . CORONARY ANGIOPLASTY WITH STENT PLACEMENT     L main OK, LAD OK, CFX 30%, RCA 90%-0% w/ 2.25 x 12 mm Promus Premier DES, EF 55%  . HERNIA REPAIR     times 2  umbilical  . TONSILLECTOMY  2005    FAMILY HISTORY: Family History  Problem Relation Age of Onset  . Diabetes Mother   . Heart disease Mother   . Hyperlipidemia Mother   . Hypertension Mother   . Lung cancer Father   .  Drug abuse Father   . Brain cancer Father 16  . Bone cancer Father 64  . Drug abuse Sister   . Mental illness Brother   . Mental illness Maternal Grandmother   . Stomach cancer Paternal Grandmother 20       d.75    SOCIAL HISTORY:  Social History   Socioeconomic History  . Marital status: Single    Spouse name: Not on file  . Number of children: 0  . Years of education: masters  . Highest education level: Not on file  Social Needs  . Financial resource strain: Somewhat hard  . Food insecurity - worry: Often true  . Food insecurity - inability: Often true  . Transportation needs - medical: No  . Transportation needs - non-medical: No  Occupational History    Comment: [employer]  Tobacco Use  . Smoking status: Former Smoker    Packs/day: 0.50    Years: 30.00    Pack years: 15.00    Types: Cigarettes    Last attempt to quit: 10/05/2015    Years since quitting: 1.8  . Smokeless tobacco: Never Used  Substance and Sexual Activity  . Alcohol use: Yes    Alcohol/week: 1.2 oz    Types: 2 Cans of beer per  week  . Drug use: No    Comment: Has not used in over 30 days.   . Sexual activity: Not Currently  Other Topics Concern  . Not on file  Social History Narrative   Consumes 2 cups of caffeine daily     PHYSICAL EXAM  Vitals:   08/22/17 1259  BP: (!) 153/88  Pulse: 69  Weight: 136 lb 8 oz (61.9 kg)  Height: '5\' 1"'  (1.549 m)    Body mass index is 25.79 kg/m.   General: The patient is well-developed and well-nourished and in no acute distress  Neurologic Exam  Mental status: The patient is alert and oriented x 3 at the time of the examination. The patient has apparent normal recent and remote memory, with an apparently normal attention span and concentration ability.   Speech is normal.  Cranial nerves: Extraocular movements are full.   Facial strength and sensation is normal. Trapezius strength is normal    No obvious hearing deficits are noted.  Motor:  Muscle bulk is normal.  Tone is increased in legs, right greater than left. Strength is 5/5.  Sensory: Touch and vibration are more symmetric now..     Gait and station: Station is normal.   Gait is spastic (right) and mildly wide. Tandem gait is mildly wide.    Reflexes: Deep tendon reflexes are symmetric and increased in legs, right greater than left. There is spread, right greater than left. 2 beats of nonsustained ankle clonus on the right.   DIAGNOSTIC DATA (LABS, IMAGING, TESTING) - I reviewed patient records, labs, notes, testing and imaging myself where available.  Lab Results  Component Value Date   WBC 11.1 (H) 06/19/2017   HGB 9.9 (L) 06/19/2017   HCT 30.2 (L) 06/19/2017   MCV 86.0 06/19/2017   PLT 429 (H) 06/19/2017      Component Value Date/Time   NA 141 06/20/2017 0858   NA 139 04/04/2017 0835   K 3.9 06/20/2017 0858   K 3.6 04/04/2017 0835   CL 105 06/20/2017 0858   CO2 23 06/20/2017 0858   CO2 25 04/04/2017 0835   GLUCOSE 94 06/20/2017 0858   GLUCOSE 74 06/19/2017 1444   GLUCOSE  83  04/04/2017 0835   BUN 13 06/20/2017 0858   BUN 13.9 04/04/2017 0835   CREATININE 1.19 (H) 06/20/2017 0858   CREATININE 1.3 (H) 04/04/2017 0835   CALCIUM 9.1 06/20/2017 0858   CALCIUM 10.0 04/04/2017 0835   PROT 6.5 06/19/2017 1444   PROT 6.7 04/04/2017 0835   ALBUMIN 3.8 06/19/2017 1444   ALBUMIN 3.9 04/04/2017 0835   AST 19 06/19/2017 1444   AST 23 04/04/2017 0835   ALT 14 06/19/2017 1444   ALT 22 04/04/2017 0835   ALKPHOS 41 06/19/2017 1444   ALKPHOS 69 04/04/2017 0835   BILITOT 0.2 (L) 06/19/2017 1444   BILITOT <0.22 04/04/2017 0835   GFRNONAA 55 (L) 06/20/2017 0858   GFRNONAA 56 (L) 11/24/2015 1113   GFRAA 64 06/20/2017 0858   GFRAA 65 11/24/2015 1113   Lab Results  Component Value Date   CHOL 120 06/20/2017   HDL 37 (L) 06/20/2017   LDLCALC 69 06/20/2017   TRIG 70 06/20/2017   CHOLHDL 3.2 06/20/2017   Lab Results  Component Value Date   HGBA1C 5.8 02/22/2016   Lab Results  Component Value Date   LKHVFMBB40 370 11/12/2015   Lab Results  Component Value Date   TSH 1.648 12/31/2016       ASSESSMENT AND PLAN  Transverse myelitis ([place])  Numbness  Malignant neoplasm of lower-outer quadrant of right breast of female, estrogen receptor positive (HCC)  Neck stiffness  Muscle spasms of both lower extremities  Gait disturbance    1.   We will check an MRI of the brain and cervical spine to determine if there are more lesions that would be worrisome for multiple sclerosis. Additionally I will check an anti--mild for NMOSD (anti-NMO is negative) 2.   Continue gabapentin 300 - 300 - 600.     Can increase if lamotrigine not benefcial 3.   Continue to be active, exercise 4.   Continue baclofen in the evening/night.   Add clonazepam 1/2-1 mg nightly. 5.   She will return to see me in 6 months or sooner if there are new or worsening neurologic symptoms.   Richard A. Felecia Shelling, MD, PhD 96/43/8381, 8:40 PM Certified in Neurology, Clinical Neurophysiology, Sleep  Medicine, Pain Medicine and Neuroimaging  [facility] [address] [place], [place] [address] [phone]

## 2017-08-23 ENCOUNTER — Ambulatory Visit: Payer: Federal, State, Local not specified - PPO

## 2017-08-23 ENCOUNTER — Ambulatory Visit (HOSPITAL_COMMUNITY): Payer: Self-pay | Admitting: Psychiatry

## 2017-08-23 ENCOUNTER — Ambulatory Visit
Admission: RE | Admit: 2017-08-23 | Discharge: 2017-08-23 | Disposition: A | Discharge status: Home / Self Care | Payer: Federal, State, Local not specified - PPO | Source: Ambulatory Visit | Attending: Radiation Oncology | Admitting: Radiation Oncology

## 2017-08-23 DIAGNOSIS — C50511 Malignant neoplasm of lower-outer quadrant of right female breast: Secondary | ICD-10-CM | POA: Diagnosis not present

## 2017-08-23 NOTE — Progress Notes (Signed)
   THERAPIST PROGRESS NOTE  Session Time: 3:40-4:30  Participation Level: Active  Behavioral Response: CasualAlertAnxious  Type of Therapy: Individual Therapy  Treatment Goals addressed: Anxiety  Interventions: CBT  Summary: Anna Henson is a 46 y.o. female who presents with GAD, Depression.   Suicidal/Homicidal: Nowithout intent/plan  Therapist Response: Pt. Presented as very anxious, talkative, appropriately tearful, engaged in the therapeutic process. Pt. Is receiving radiation treatment for stage 2 breast cancer, continues to receive treatment for neurological disorder, and is receiving treatment from our office for depression and anxiety. Pt. Discussed that her work has become extremely difficult, stressful, and "unbearable" due to her physical and mental health conditions. Significant time in session was spent discussing the pros and cons of going on permanent disability. Pt. Discussed her fears of not receiving her disability and losing her job. Counselor checked in around current coping behaviors around use of alcohol. Pt. Reported that she has stopped daily drinking because she was informed by her oncologist about how it would affect her cancer treatment and by her psychiatrist about interaction with medications. Pt. Stated that she is not daily drinking, only occasionally. She stated that she will have 3-4 16 ounce beers at home or out at dinner, used to be once a week, but more recently less than once a week. Pt. Stated that she has not had hard liquor in a very long time. Counselor took Pt. To meet with CNA to discuss procedure for submitting long-term disability paperwork from our office to her employer.  Plan: Return again in 2 weeks. Pt,. To continue with solution-focused, strength based therapy.  Diagnosis: Bipolar Disorder, Generalized Anxiety Disorder    Nancie Neas, [facility] 08/23/2017

## 2017-08-24 ENCOUNTER — Ambulatory Visit
Admission: RE | Admit: 2017-08-24 | Discharge: 2017-08-24 | Disposition: A | Discharge status: Home / Self Care | Payer: Federal, State, Local not specified - PPO | Source: Ambulatory Visit | Attending: Radiation Oncology | Admitting: Radiation Oncology

## 2017-08-24 DIAGNOSIS — C50511 Malignant neoplasm of lower-outer quadrant of right female breast: Secondary | ICD-10-CM | POA: Diagnosis not present

## 2017-08-27 ENCOUNTER — Ambulatory Visit: Payer: Federal, State, Local not specified - PPO

## 2017-08-27 ENCOUNTER — Emergency Department (HOSPITAL_COMMUNITY)
Admission: EM | Admit: 2017-08-27 | Discharge: 2017-08-28 | Disposition: A | Discharge status: Other Acute Inpatient Hospital | Payer: Federal, State, Local not specified - PPO | Attending: Emergency Medicine | Admitting: Emergency Medicine

## 2017-08-27 ENCOUNTER — Telehealth: Payer: Self-pay | Admitting: Neurology

## 2017-08-27 ENCOUNTER — Other Ambulatory Visit: Payer: Self-pay

## 2017-08-27 ENCOUNTER — Encounter (HOSPITAL_COMMUNITY): Payer: Self-pay

## 2017-08-27 DIAGNOSIS — Z79899 Other long term (current) drug therapy: Secondary | ICD-10-CM | POA: Diagnosis not present

## 2017-08-27 DIAGNOSIS — Z046 Encounter for general psychiatric examination, requested by authority: Secondary | ICD-10-CM | POA: Diagnosis not present

## 2017-08-27 DIAGNOSIS — N189 Chronic kidney disease, unspecified: Secondary | ICD-10-CM | POA: Insufficient documentation

## 2017-08-27 DIAGNOSIS — Z853 Personal history of malignant neoplasm of breast: Secondary | ICD-10-CM | POA: Insufficient documentation

## 2017-08-27 DIAGNOSIS — Z7982 Long term (current) use of aspirin: Secondary | ICD-10-CM | POA: Insufficient documentation

## 2017-08-27 DIAGNOSIS — F329 Major depressive disorder, single episode, unspecified: Secondary | ICD-10-CM | POA: Insufficient documentation

## 2017-08-27 DIAGNOSIS — I2510 Atherosclerotic heart disease of native coronary artery without angina pectoris: Secondary | ICD-10-CM | POA: Insufficient documentation

## 2017-08-27 DIAGNOSIS — I129 Hypertensive chronic kidney disease with stage 1 through stage 4 chronic kidney disease, or unspecified chronic kidney disease: Secondary | ICD-10-CM | POA: Insufficient documentation

## 2017-08-27 DIAGNOSIS — Z955 Presence of coronary angioplasty implant and graft: Secondary | ICD-10-CM | POA: Diagnosis not present

## 2017-08-27 DIAGNOSIS — Z87891 Personal history of nicotine dependence: Secondary | ICD-10-CM | POA: Insufficient documentation

## 2017-08-27 DIAGNOSIS — R45851 Suicidal ideations: Secondary | ICD-10-CM | POA: Diagnosis not present

## 2017-08-27 LAB — CBC
HCT: 31.4 % — ABNORMAL LOW (ref 36.0–46.0)
Hemoglobin: 10.8 g/dL — ABNORMAL LOW (ref 12.0–15.0)
MCH: 27.8 pg (ref 26.0–34.0)
MCHC: 34.4 g/dL (ref 30.0–36.0)
MCV: 80.7 fL (ref 78.0–100.0)
Platelets: 378 10*3/uL (ref 150–400)
RBC: 3.89 MIL/uL (ref 3.87–5.11)
RDW: 18.1 % — ABNORMAL HIGH (ref 11.5–15.5)
WBC: 10.8 10*3/uL — ABNORMAL HIGH (ref 4.0–10.5)

## 2017-08-27 LAB — COMPREHENSIVE METABOLIC PANEL
ALT: 17 U/L (ref 14–54)
AST: 24 U/L (ref 15–41)
Albumin: 3.9 g/dL (ref 3.5–5.0)
Alkaline Phosphatase: 52 U/L (ref 38–126)
Anion gap: 5 (ref 5–15)
BUN: 9 mg/dL (ref 6–20)
CO2: 23 mmol/L (ref 22–32)
Calcium: 9.1 mg/dL (ref 8.9–10.3)
Chloride: 110 mmol/L (ref 101–111)
Creatinine, Ser: 1.15 mg/dL — ABNORMAL HIGH (ref 0.44–1.00)
GFR calc Af Amer: >60 mL/min (ref >60)
GFR calc non Af Amer: 57 mL/min — ABNORMAL LOW (ref >60)
Glucose, Bld: 124 mg/dL — ABNORMAL HIGH (ref 65–99)
Potassium: 3.6 mmol/L (ref 3.5–5.1)
Sodium: 138 mmol/L (ref 135–145)
Total Bilirubin: 0.2 mg/dL — ABNORMAL LOW (ref 0.3–1.2)
Total Protein: 6.2 g/dL — ABNORMAL LOW (ref 6.5–8.1)

## 2017-08-27 LAB — ETHANOL: Alcohol, Ethyl (B): <10 mg/dL (ref <10)

## 2017-08-27 LAB — ACETAMINOPHEN LEVEL: Acetaminophen (Tylenol), Serum: <10 ug/mL — ABNORMAL LOW (ref 10–30)

## 2017-08-27 LAB — RAPID URINE DRUG SCREEN, HOSP PERFORMED
Amphetamines: NOT DETECTED
Barbiturates: NOT DETECTED
Benzodiazepines: NOT DETECTED
Cocaine: NOT DETECTED
Opiates: NOT DETECTED
Tetrahydrocannabinol: POSITIVE — AB

## 2017-08-27 LAB — I-STAT BETA HCG BLOOD, ED (MC, WL, AP ONLY): I-stat hCG, quantitative: <5 m[IU]/mL (ref <5)

## 2017-08-27 LAB — SALICYLATE LEVEL: Salicylate Lvl: <7 mg/dL (ref 2.8–30.0)

## 2017-08-27 MED ORDER — TRAMADOL HCL 50 MG PO TABS: 50.0000 mg | ORAL_TABLET | Freq: Two times a day (BID) | ORAL | Status: DC | PRN

## 2017-08-27 MED ORDER — ASPIRIN 81 MG PO CHEW: 81.0000 mg | CHEWABLE_TABLET | Freq: Every day | ORAL | Status: DC

## 2017-08-27 MED ORDER — ARIPIPRAZOLE 10 MG PO TABS: 10.0000 mg | ORAL_TABLET | Freq: Every day | ORAL | Status: DC

## 2017-08-27 MED ORDER — LORAZEPAM 0.5 MG PO TABS
0.5000 mg | ORAL_TABLET | Freq: Once | ORAL | Status: AC
  Administered 2017-08-27: 0.5 mg via ORAL
  Filled 2017-08-27: qty 1

## 2017-08-27 MED ORDER — ATORVASTATIN CALCIUM 80 MG PO TABS: 80.0000 mg | ORAL_TABLET | Freq: Every day | ORAL | Status: DC

## 2017-08-27 MED ORDER — BACID PO TABS
1.0000 | ORAL_TABLET | Freq: Every day | ORAL | Status: DC
  Filled 2017-08-27: qty 1

## 2017-08-27 MED ORDER — LISINOPRIL 20 MG PO TABS
20.0000 mg | ORAL_TABLET | Freq: Two times a day (BID) | ORAL | Status: DC
  Administered 2017-08-27: 20 mg via ORAL
  Filled 2017-08-27: qty 1

## 2017-08-27 MED ORDER — PANTOPRAZOLE SODIUM 40 MG PO TBEC
40.0000 mg | DELAYED_RELEASE_TABLET | Freq: Two times a day (BID) | ORAL | Status: DC
  Administered 2017-08-27: 40 mg via ORAL
  Filled 2017-08-27: qty 1

## 2017-08-27 MED ORDER — CLONAZEPAM 0.5 MG PO TABS
0.5000 mg | ORAL_TABLET | Freq: Every day | ORAL | Status: DC
  Administered 2017-08-27: 0.5 mg via ORAL
  Filled 2017-08-27: qty 1

## 2017-08-27 MED ORDER — AMLODIPINE BESYLATE 5 MG PO TABS: 5.0000 mg | ORAL_TABLET | Freq: Every day | ORAL | Status: DC

## 2017-08-27 MED ORDER — ALUM & MAG HYDROXIDE-SIMETH 200-200-20 MG/5ML PO SUSP: 30.0000 mL | Freq: Four times a day (QID) | ORAL | Status: DC | PRN

## 2017-08-27 MED ORDER — METOPROLOL TARTRATE 25 MG PO TABS
25.0000 mg | ORAL_TABLET | Freq: Two times a day (BID) | ORAL | Status: DC
  Administered 2017-08-27: 25 mg via ORAL
  Filled 2017-08-27: qty 1

## 2017-08-27 MED ORDER — ALRA NON-METALLIC DEODORANT (RAD-ONC): 1.0000 "application " | Freq: Every day | TOPICAL | Status: DC | PRN

## 2017-08-27 MED ORDER — GABAPENTIN 300 MG PO CAPS: 300.0000 mg | ORAL_CAPSULE | ORAL | Status: DC

## 2017-08-27 MED ORDER — TAMOXIFEN CITRATE 10 MG PO TABS
20.0000 mg | ORAL_TABLET | Freq: Every day | ORAL | Status: DC
  Filled 2017-08-27: qty 2

## 2017-08-27 MED ORDER — FLORA-Q PO CAPS
ORAL_CAPSULE | Freq: Every day | ORAL | Status: DC
  Filled 2017-08-27: qty 1

## 2017-08-27 MED ORDER — ONDANSETRON HCL 4 MG PO TABS: 4.0000 mg | ORAL_TABLET | Freq: Three times a day (TID) | ORAL | Status: DC | PRN

## 2017-08-27 MED ORDER — FOLIC ACID 1 MG PO TABS: 1.0000 mg | ORAL_TABLET | Freq: Every day | ORAL | Status: DC

## 2017-08-27 MED ORDER — NITROGLYCERIN 0.4 MG SL SUBL: 0.4000 mg | SUBLINGUAL_TABLET | SUBLINGUAL | Status: DC | PRN

## 2017-08-27 NOTE — Telephone Encounter (Signed)
I have spoken with Anna Henson.  She is tearful, but appropriate in her conversation.  She does have Bipolar Disorder and sees a therapist.  She wanted to see if any meds RAS rx's may cause an increase in depression/anxiety.  I have spoke with RAS and advised pt. that none of the meds he has her on should be causing these sx  She verbalized understanding of same. After speaking with pt., and confirming that she has no plan to hurt herself or others, that she does want treatment,  a verbal plan was made that she will call her therapist now.  If she is unable to reach him, she will call us back and go to the ER for psych assessment.  She is agreeable with this plan.

## 2017-08-27 NOTE — Telephone Encounter (Signed)
Discussed with RN

## 2017-08-27 NOTE — BH Assessment (Signed)
Elmarie Shiley, NP recommends inpatient. TTS to look for placement

## 2017-08-27 NOTE — ED Notes (Signed)
After delivery of [place] sandwich bag, tray from cafeteria arrived.  Patient consuming both at this time.

## 2017-08-27 NOTE — Telephone Encounter (Signed)
Pt called said she is in a severe depression, having suicidal thoughts, not wanting to be here. She is wondering if any medications she is taking could be causing this. RN was skyped, call transferred to her.

## 2017-08-27 NOTE — ED Notes (Signed)
Pt's mother updated on care and assisted to [place].  Pt and mother made aware of visiting hours for tomorrow, and more strict follow through with mother tomorrow for visitation.  Verbalized understanding.    Patient's mother can be called with updates, patient verbalized agreement with this statement:    Meryle Ready [phone]

## 2017-08-27 NOTE — ED Notes (Signed)
Pt updated on POC, and mother brought back to room from waiting to talk with patient prior to leaving.  Will ask mother to take pt's belongings.

## 2017-08-27 NOTE — ED Notes (Signed)
Dr. Ralene Bathe made aware of inpatient plan for patient

## 2017-08-27 NOTE — ED Notes (Signed)
Pt continued to be on TTS

## 2017-08-27 NOTE — BH Assessment (Signed)
Tele Assessment Note   Patient Name: Anna Henson MRN: [id number] Referring Physician: Quintella Reichert, MD Location of Patient: [facility] Location of Provider: [place] is an 46 y.o. female who called her neurologist office earlier today in tears but denied risk to self. The patient was not able to connect with her therapist and called the crisis line at [facility]. The patient was referred to the ER where she then expressed SI with plan to hang self or stab herself.  The patient has been under a lot of stress lately due to a heart attack in March and currently under going radiation for breast cancer. The patients prognosis appears to be good. The patient states "they got all the cancer" and she has 3.5 weeks left of radiation. Experiences some fatigue but currently goes on about her day as normal, working full time at the [employer]. The patient states she often has thoughts that she would be better off dead. Feels her symptoms have worsened over the last month or so. Patient has a hx of slitting her wrist in a SI attempt in her 20's. The patient had poor skills, addressing her anxiety with heavy drinking 3 to 4 times a week. The patient drank 4 to 5 liquor drinks at a time but cut back in July. Last drink was a week ago at dinner, had a glass of wine. The patient expressed increased anxiety over the last several months.   The patient states current thoughts to hang or stab self. Expressed wanting her pain and anxiety to go away.  The patient indicated she was most concerned about stabbing herself than hanging. The patient normally lives alone but found herself needing to hide her knives. She went to live with her mother in the last month but anxiety continued, as well as, suicidal thought. The patient expressed her mother wanted her to come get help today. The patient attends [facility] outpatient therapy and psychiatry with Eloise Levels and Dr. Adele Schilder. The  patient's anxiety medication was increased one week ago with no change in anxiety. The patient expressed her therapist wanted her to take a break form work but states "I'm a work horse." The patient expressed a high level anxiety at work, recently resulting in a panic attack last Friday. The patient reports keeping all of her frustration and anger to herself. The patient periodically has a curiosity about choking other people, strangers and people she knows. Last time was 2 weeks ago. Has no intent to harm others. Denies AVH.  The patient was tearful at times, had depressed mood, logical speech, was alert and oriented, had unremarkable appearance, impaired insight.   Elmarie Shiley, NP recommends inpatient. TTS to look for placement  Diagnosis: MDD  Past Medical History:  Past Medical History:  Diagnosis Date  . Anxiety   . Bipolar disorder ([place])   . Breast cancer, right breast ([place]) ~ 03/2017  . CAD in native artery    a. 09/2014 Cath/PCI in setting of [place] - s/p 2.25 x 12 mm Promus Premier DES to the RCA;  b. 11/2016 Myoview: EF 56%, no ischemia;  c. 12/2016 NSTEMI/PCI: LM nl, LAd 25p, LCX 30p, RCA 100p (2.75x38 Promus Premier DES), mRCA 10 ISR, EF 50-55%.  . Chronic kidney disease    she sthinks has stage 2 CKD, has had US doppler and has some stenosis , this was done in [place]   . CKD (chronic kidney disease), stage II   .  Depression   . Dysrhythmia    Tachycardia in evenings  . Genetic testing 04/23/2017   Ms. Calo underwent genetic counseling and testing for hereditary cancer syndromes on 04/05/2017. Her results were negative for mutations in all 46 genes analyzed by [facility]'s 46-gene Common Hereditary Cancers Panel. Genes analyzed include: APC, ATM, AXIN2, BARD1, BMPR1A, BRCA1, BRCA2, BRIP1, CDH1, CDKN2A, CHEK2, CTNNA1, DICER1, EPCAM, GREM1, HOXB13, KIT, MEN1, MLH1, MSH2, MSH3, MSH6, MUTYH, NBN,  . GERD (gastroesophageal reflux disease)   . Headache    "bi-weekly" (05/07/2017)  . Heart  attack ([place]) 12/31/2016  . HOCM (hypertrophic obstructive cardiomyopathy) ([place])    a. 12/2016 Echo: EF 65-70%,  mod conc LVH, dynamic obstruction @ rest, peak velocity of 291 cm/sec w/ peak gradient of 61mHg, no rwma, Gr1 DD, triv TR, PASP 192mg.  . Marland Kitchenyperlipidemia   . Hypertension    > 5 years  . Migraine    "probably 1/month" (05/07/2017)  . Palpitations   . Pneumonia    "twice when I was a child" (05/07/2017)  . Tobacco abuse 09/13/2014  . Transverse myelitis ([facility]    Past Surgical History:  Procedure Laterality Date  . ABDOMINAL HERNIA REPAIR  ~ 2001; ~ 2005  . BREAST BIOPSY Right ~ 03/2017 X 2  . CORONARY ANGIOPLASTY WITH STENT PLACEMENT     L main OK, LAD OK, CFX 30%, RCA 90%-0% w/ 2.25 x 12 mm Promus Premier DES, EF 55%  . Coronary/Graft Acute MI Revascularization N/A 12/31/2016   [facility]  . HERNIA REPAIR     times 2  umbilical  . Left Heart Cath and Coronary Angiography N/A 12/31/2016   [facility]  . LEFT HEART CATHETERIZATION WITH CORONARY ANGIOGRAM N/A 09/14/2014   [facility]  . PERCUTANEOUS CORONARY STENT INTERVENTION (PCI-S)  09/14/2014   [facility]  . RIGHT BREAST BRACKETED SEED GUIDED LUMPECTOMY AND SENTINEL LYMPH NODE BIOPSY Right 06/21/2017   [facility]formed by [facility]ul[facility] TONSIL[facility]  2005    Family History:  Family History  Problem Relation Age of Onset  . Diabetes Mother   . Heart disease Mother   . Hyperlipidemia Mother   . Hypertension Mother   . Lung cancer Father   . Drug abuse Father   . Brain cancer Father [id number]. Bone cancer Father 7352. Drug abuse Sister   . Mental illness Brother   . Mental illness Maternal Grandmother   . Stomach cancer Paternal Grandmother [id number]     [id number]    Social History:  reports that she quit smoking about 22 months ago. Her  smoking use included cigarettes. She has a 15.00 pack-year smoking history. she has never used smokeless tobacco. She reports that she drinks about 1.2 oz of alcohol per week. She reports that she does not use drugs.  Additional Social History:  Alcohol / Drug Use Pain Medications: see MAR Prescriptions: see MAR Over the Counter: see MAR History of alcohol / drug use?: Yes Substance #1 Name of Substance 1: alcohol  1 - Age of First Use: UTA 1 - Amount (size/oz): UTA 1 - Frequency: UTA 1 - Duration: UTA 1 - Last Use / Amount: a drink at dinner about a week ago - was drinking hard liquor- 3 to4 times  a week, 4 to 5 drinks  Substance #2 Name of Substance 2: cannabis  2 - Age of First Use: uTA 2 - Amount (size/oz): UTA 2 - Frequency: UTA 2 - Duration: UTA 2 - Last Use / Amount: UTA  CIWA: CIWA-Ar BP: (!) 165/89 Pulse Rate: 62 COWS:    PATIENT STRENGTHS: (choose at least two) Average or above average intelligence General fund of knowledge  Allergies: No Known Allergies  Home Medications:  (Not in a hospital admission)  OB/GYN Status:  Patient's last menstrual period was 08/02/2017.  General Assessment Data Location of Assessment: [facility] ED TTS Assessment: In system Is this a Tele or Face-to-Face Assessment?: Tele Assessment Is this an Initial Assessment or a Re-assessment for this encounter?: Initial Assessment Marital status: Single Is patient pregnant?: No Pregnancy Status: No Living Arrangements: Alone(living with mom temp. ) Can pt return to current living arrangement?: Yes Admission Status: Voluntary Is patient capable of signing voluntary admission?: Yes Referral Source: Self/Family/Friend Insurance type: [place] Screening Exam ([place]) Medical Exam completed: Yes  Crisis Care Plan Living Arrangements: Alone(living with mom temp. ) Name of Psychiatrist: Dr. Adele Schilder  Name of Therapist: Eloise Levels- [facility]   Education Status Is patient  currently in school?: No  Risk to self with the past 6 months Suicidal Ideation: Yes-Currently Present Has patient been a risk to self within the past 6 months prior to admission? : Yes Suicidal Intent: No Has patient had any suicidal intent within the past 6 months prior to admission? : Yes Is patient at risk for suicide?: Yes Suicidal Plan?: Yes-Currently Present Has patient had any suicidal plan within the past 6 months prior to admission? : Yes Specify Current Suicidal Plan: stab self or hang self Access to Means: Yes Specify Access to Suicidal Means: knives  What has been your use of drugs/alcohol within the last 12 months?: alcohol Previous Attempts/Gestures: Yes How many times?: 1 Other Self Harm Risks: slit wrist- 46 yr old  Triggers for Past Attempts: Unknown Intentional Self Injurious Behavior: None Family Suicide History: Yes(sister- OD, brother MH issues) Recent stressful life event(s): Other (Comment) Persecutory voices/beliefs?: No Depression: Yes Depression Symptoms: Fatigue, Feeling angry/irritable Substance abuse history and/or treatment for substance abuse?: No Suicide prevention information given to non-admitted patients: Not applicable  Risk to Others within the past 6 months Homicidal Ideation: No Does patient have any lifetime risk of violence toward others beyond the six months prior to admission? : No Thoughts of Harm to Others: No Current Homicidal Intent: No Current Homicidal Plan: No Access to Homicidal Means: No Identified Victim: n/a History of harm to others?: No Assessment of Violence: None Noted Violent Behavior Description: n/a Does patient have access to weapons?: No Criminal Charges Pending?: No Does patient have a court date: No Is patient on probation?: No  Psychosis Hallucinations: None noted Delusions: None noted  Mental Status Report Appearance/Hygiene: Unremarkable Eye Contact: Fair Motor Activity: Unremarkable Speech:  Logical/coherent Level of Consciousness: Alert Mood: Depressed Affect: Depressed, Sad Anxiety Level: Panic Attacks Panic attack frequency: daily for 2 week  Most recent panic attack: Friday at work  Thought Processes: Coherent, Relevant Judgement: Impaired Orientation: Person, Place, Time, Situation Obsessive Compulsive Thoughts/Behaviors: None  Cognitive Functioning Concentration: Normal Memory: Recent Intact, Remote Intact IQ: Average Insight: Poor Impulse Control: Fair Appetite: Good Weight Loss: 0 Weight Gain: 0 Sleep: Decreased Total Hours of Sleep: (restless leg ) Vegetative Symptoms: None  ADLScreening [facility] Assessment Services) Patient's cognitive ability adequate to  safely complete daily activities?: Yes Patient able to express need for assistance with ADLs?: Yes Independently performs ADLs?: Yes (appropriate for developmental age)  Prior Inpatient Therapy Prior Inpatient Therapy: No  Prior Outpatient Therapy Prior Outpatient Therapy: Yes Prior Therapy Dates: every few months  Prior Therapy Facilty/Provider(s): [facility] outpatient  Reason for Treatment: depression Does patient have an ACCT team?: No Does patient have Intensive In-House Services?  : No Does patient have [facility] services? : No Does patient have P4CC services?: No  ADL Screening (condition at time of admission) Patient's cognitive ability adequate to safely complete daily activities?: Yes Is the patient deaf or have difficulty hearing?: No Does the patient have difficulty seeing, even when wearing glasses/contacts?: No Does the patient have difficulty concentrating, remembering, or making decisions?: No Patient able to express need for assistance with ADLs?: Yes Does the patient have difficulty dressing or bathing?: No Independently performs ADLs?: Yes (appropriate for developmental age)       Abuse/Neglect Assessment (Assessment to be complete while patient is alone) Abuse/Neglect Assessment  Can Be Completed: (UTA)     Advance Directives (For Healthcare) Does Patient Have a Medical Advance Directive?: No Would patient like information on creating a medical advance directive?: No - Patient declined    Additional Information 1:1 In Past 12 Months?: No CIRT Risk: No Elopement Risk: No Does patient have medical clearance?: Yes     Disposition:  Disposition Initial Assessment Completed for this Encounter: Yes Disposition of Patient: Inpatient treatment program Type of inpatient treatment program: Adult  This service was provided via telemedicine using a 2-way, interactive audio and video technology.     Aileen Pilot [facility] 08/27/2017 6:16 PM

## 2017-08-27 NOTE — ED Notes (Signed)
Patient tearful with mom in the room.  Expressing stress about not being able to go to work tomorrow, and other stressors unrelated to current psychiatric need.  Expressing anxiety.  Reached out to Dr. Ralene Bathe for guidance, will order Ativan

## 2017-08-27 NOTE — ED Notes (Signed)
Pt provided with [place] sandwich bag while waiting for dinner tray to arrive

## 2017-08-27 NOTE — ED Provider Notes (Signed)
[facility] EMERGENCY DEPARTMENT Provider Note   CSN: [id number] Arrival date & time: 08/27/17  1228     History   Chief Complaint Chief Complaint  Patient presents with  . Psychiatric Evaluation    HPI Anna Henson is a 46 y.o. female.  The history is provided by the patient. No language interpreter was used.   Anna Henson is a 46 y.o. female who presents to the Emergency Department complaining of depression.  She presents to the emergency department for psychiatric evaluation due to depression.  She has been experiencing multiple medical issues over the last several months and has recently experienced increased depression over the last several months.  She reports feelings of hopelessness and depression.  She states that she wants to die at times and thinks about hanging herself.  She becomes tearful unexpectedly at times.  She has been staying with her mother because she does not feel safe at home.  She is seeing her psychiatrist for the symptoms and had her Abilify increased a few weeks ago with no improvement in her symptoms.  Symptoms are moderate to severe, constant, worsening. Past Medical History:  Diagnosis Date  . Anxiety   . Bipolar disorder ([place])   . Breast cancer, right breast ([place]) ~ 03/2017  . CAD in native artery    a. 09/2014 Cath/PCI in setting of [place] - s/p 2.25 x 12 mm Promus Premier DES to the RCA;  b. 11/2016 Myoview: EF 56%, no ischemia;  c. 12/2016 NSTEMI/PCI: LM nl, LAd 25p, LCX 30p, RCA 100p (2.75x38 Promus Premier DES), mRCA 10 ISR, EF 50-55%.  . Chronic kidney disease    she sthinks has stage 2 CKD, has had US doppler and has some stenosis , this was done in [place]   . CKD (chronic kidney disease), stage II   . Depression   . Dysrhythmia    Tachycardia in evenings  . Genetic testing 04/23/2017   Ms. Sampley underwent genetic counseling and testing for hereditary cancer syndromes on 04/05/2017. Her results were negative for mutations  in all 46 genes analyzed by [facility]'s 46-gene Common Hereditary Cancers Panel. Genes analyzed include: APC, ATM, AXIN2, BARD1, BMPR1A, BRCA1, BRCA2, BRIP1, CDH1, CDKN2A, CHEK2, CTNNA1, DICER1, EPCAM, GREM1, HOXB13, KIT, MEN1, MLH1, MSH2, MSH3, MSH6, MUTYH, NBN,  . GERD (gastroesophageal reflux disease)   . Headache    "bi-weekly" (05/07/2017)  . Heart attack ([place]) 12/31/2016  . HOCM (hypertrophic obstructive cardiomyopathy) ([place])    a. 12/2016 Echo: EF 65-70%,  mod conc LVH, dynamic obstruction @ rest, peak velocity of 291 cm/sec w/ peak gradient of 3mHg, no rwma, Gr1 DD, triv TR, PASP 127mg.  . Marland Kitchenyperlipidemia   . Hypertension    > 5 years  . Migraine    "probably 1/month" (05/07/2017)  . Palpitations   . Pneumonia    "twice when I was a child" (05/07/2017)  . Tobacco abuse 09/13/2014  . 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 Neck stiffness 08/22/2017  . Abnormal finding on MRI of brain 08/22/2017  . HOCM (hypertrophic obstructive cardiomyopathy) ([place]  . Chest pain 05/06/2017  . Genetic testing 04/23/2017  . Malignant neoplasm of lower-outer quadrant of right breast of female, estrogen receptor positive (HCGant[place]06/21/2018  . Dysesthesia 03/12/2017  . Palpitations 02/23/2017  . S/P cardiac cath (12/2016) a. acute total occlusion of the RCA tx w/PCI DES, b. mild nonobs LAD/LCx stenosis, c. mild segmental  LV sys dx w/ sev hypokinesis, LVEF 50-55% 01/02/2017  . Non-ST elevation myocardial infarction (NSTEMI), subsequent episode of care ([place]) 12/31/2016  . Malignant hypertension   . Chronic kidney disease   . ST elevation myocardial infarction involving right coronary artery ([place])   . Abnormal EKG   . Nausea   . Ischemic cardiomyopathy   . Muscle spasms of both lower extremities 04/17/2016  . Gait disturbance 01/25/2016  . 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 Acute-on-chronic kidney injury ([place]) 11/12/2015  . Neck pain 11/12/2015    . Hypokalemia 11/12/2015  . Abnormal MRI, neck 11/12/2015  . Numbness 11/12/2015  . Hypertension   . Hyperlipidemia   . Coronary artery disease involving native coronary artery of native heart without angina pectoris   . Facial numbness   . Right arm numbness   . Throat pain 11/09/2015  . Drooling 11/09/2015  . CAD (coronary artery disease), native coronary artery 09/15/2014  . Headache 09/15/2014  . UTI (urinary tract infection): Probable 09/14/2014  . Candida infection of genital region 09/14/2014  . Unstable angina ([place]) 09/13/2014  . HLD (hyperlipidemia) 09/13/2014  . Otitis 09/13/2014  . ACS (acute coronary syndrome) ([place]) 09/13/2014  . Tobacco abuse 09/13/2014  . Essential hypertension 09/09/2014    Past Surgical History:  Procedure Laterality Date  . ABDOMINAL HERNIA REPAIR  ~ 2001; ~ 2005  . BREAST BIOPSY Right ~ 03/2017 X 2  . CORONARY ANGIOPLASTY WITH STENT PLACEMENT     L main OK, LAD OK, CFX 30%, RCA 90%-0% w/ 2.25 x 12 mm Promus Premier DES, EF 55%  . Coronary/Graft Acute MI Revascularization N/A 12/31/2016   Performed by Sherren Mocha, MD at [facility]  . HERNIA REPAIR     times 2  umbilical  . Left Heart Cath and Coronary Angiography N/A 12/31/2016   Performed by Sherren Mocha, MD at [facility]  . LEFT HEART CATHETERIZATION WITH CORONARY ANGIOGRAM N/A 09/14/2014   Performed by Burnell Blanks, MD at [facility]  . PERCUTANEOUS CORONARY STENT INTERVENTION (PCI-S)  09/14/2014   Performed by Burnell Blanks, MD at [facility] CATH LAB  . RIGHT BREAST BRACKETED SEED GUIDED LUMPECTOMY AND SENTINEL LYMPH NODE BIOPSY Right 06/21/2017   Performed by Jovita Kussmaul, MD at [facility]  . TONSILLECTOMY  2005    OB History    No data available       Home Medications    Prior to Admission medications   Medication Sig Start Date End Date Taking? Authorizing Provider  amLODipine (NORVASC) 5 MG tablet Take 1 tablet (5 mg total) by mouth daily.  06/17/17 06/17/18 Yes Isaiah Serge, NP  ARIPiprazole (ABILIFY) 10 MG tablet Take 1 tablet (10 mg total) daily by mouth. 08/16/17  Yes Arfeen, Arlyce Harman, MD  aspirin 81 MG chewable tablet Chew 1 tablet (81 mg total) by mouth daily. 09/16/14  Yes Nita Sells, MD  atorvastatin (LIPITOR) 80 MG tablet Take 1 tablet (80 mg total) by mouth daily. Patient taking differently: Take 80 mg by mouth daily at 6 PM.  06/12/17  Yes Hilty, Nadean Corwin, MD  baclofen (LIORESAL) 10 MG tablet Take up to 4/day for muscle spasms Patient taking differently: Take 10 mg 4 (four) times daily as needed by mouth for muscle spasms (1 tablet in the morning and 1 inth eevenining and 2 at bedtime).  03/12/17  Yes Sater, Nanine Means, MD  clonazePAM (KLONOPIN) 1 MG tablet 1/2 to 1 pill  qHS Patient taking differently: Take 0.5 mg at bedtime by mouth.  08/22/17  Yes Sater, Nanine Means, MD  fluticasone (FLONASE) 50 MCG/ACT nasal spray SHAKE LIQUID AND USE 2 SPRAYS IN EACH NOSTRIL DAILY Patient taking differently: SHAKE LIQUID AND USE 2 SPRAYS IN EACH NOSTRIL DAILY as need rhinitis 12/04/16  Yes Jeffery, Chelle, PA-C  folic acid (FOLVITE) 1 MG tablet Take 1 mg by mouth daily.   Yes [provider]  furosemide (LASIX) 20 MG tablet TAKE 1 TABLET(20 MG) BY MOUTH DAILY AS NEEDED Patient taking differently: TAKE 1 TABLET(20 MG) BY MOUTH DAILY AS NEEDED FOR FLUID RETENTION 05/28/17  Yes Hilty, Nadean Corwin, MD  gabapentin (NEURONTIN) 300 MG capsule Take one pill in the morning, two in the afternoon and two po at bedtime Patient taking differently: Take 300-600 mg See admin instructions by mouth. Take 300 mg capsule every morning then take 600 mg capsules in the afternoon and at 600 mg at bedtime 03/12/17  Yes Sater, Nanine Means, MD  hyaluronate sodium (RADIAPLEXRX) GEL Apply 1 application topically 2 (two) times daily. Apply to skin area after rd txs and bedtime, nothing 4 hours prior to rad tx 08/08/17  Yes Hayden Pedro, PA-C  hydrOXYzine  (VISTARIL) 25 MG capsule Take 1-2 capsule at bed time for anxiety and insomnia Patient taking differently: Take 50 mg at bedtime by mouth. Take 1-2 capsule at bed time for anxiety and insomnia 08/16/17  Yes Arfeen, Arlyce Harman, MD  lisinopril (PRINIVIL,ZESTRIL) 20 MG tablet Take 1 tablet (20 mg total) by mouth 2 (two) times daily. 08/03/17 11/01/17 Yes Hilty, Nadean Corwin, MD  metoprolol tartrate (LOPRESSOR) 50 MG tablet Take 0.5 tablets (25 mg total) by mouth 2 (two) times daily. 07/31/17  Yes Hilty, Nadean Corwin, MD  nitroGLYCERIN (NITROSTAT) 0.4 MG SL tablet Place 1 tablet (0.4 mg total) under the tongue every 5 (five) minutes as needed for chest pain. 10/20/16 08/27/17 Yes Strader, Fransisco Hertz, PA-C  non-metallic deodorant Jethro Poling) MISC Apply 1 application topically.   Yes Hayden Pedro, PA-C  pantoprazole (PROTONIX) 40 MG tablet Take 1 tablet (40 mg total) by mouth 2 (two) times daily. 01/02/17  Yes Carlota Raspberry, Tiffany, PA-C  Probiotic Product (PROBIOTIC PO) Take 1 tablet by mouth daily.   Yes [provider]  tamoxifen (NOLVADEX) 20 MG tablet Take 1 tablet (20 mg total) by mouth daily. 04/04/17  Yes Nicholas Lose, MD  traMADol (ULTRAM) 50 MG tablet Take 1 tablet (50 mg total) by mouth 4 (four) times daily as needed. Patient taking differently: Take 50-100 mg by mouth every 12 (twelve) hours as needed for moderate pain or severe pain (depends on pain level if takes 1-2 tablets).  08/21/16  Yes Sater, Nanine Means, MD  Omega-3 Fatty Acids (OMEGA 3 PO) Take 2 capsules by mouth daily.    [provider]    Family History Family History  Problem Relation Age of Onset  . Diabetes Mother   . Heart disease Mother   . Hyperlipidemia Mother   . Hypertension Mother   . Lung cancer Father   . Drug abuse Father   . Brain cancer Father 65  . Bone cancer Father 4  . Drug abuse Sister   . Mental illness Brother   . Mental illness Maternal Grandmother   . Stomach cancer Paternal Grandmother 12        d.75    Social History Social History   Tobacco Use  . Smoking status: Former Smoker  Packs/day: 0.50    Years: 30.00    Pack years: 15.00    Types: Cigarettes    Last attempt to quit: 10/05/2015    Years since quitting: 1.8  . Smokeless tobacco: Never Used  Substance Use Topics  . Alcohol use: Yes    Alcohol/week: 1.2 oz    Types: 2 Cans of beer per week  . Drug use: No     Allergies   Patient has no known allergies.   Review of Systems Review of Systems  All other systems reviewed and are negative.    Physical Exam Updated Vital Signs BP (!) 143/71 (BP Location: Left Arm)   Pulse (!) 59   Temp 98 F (36.7 C) (Oral)   Resp 18   LMP 08/02/2017   SpO2 100%   Physical Exam  Constitutional: She is oriented to person, place, and time. She appears well-developed and well-nourished.  HENT:  Head: Normocephalic and atraumatic.  Cardiovascular: Normal rate and regular rhythm.  No murmur heard. Pulmonary/Chest: Effort normal and breath sounds normal. No respiratory distress.  Abdominal: Soft. There is no tenderness. There is no rebound and no guarding.  Musculoskeletal: She exhibits no edema or tenderness.  Neurological: She is alert and oriented to person, place, and time.  Skin: Skin is warm and dry.  Psychiatric:  Tearful and endorses feelings of hopelessness and suicidal thoughts.  Nursing note and vitals reviewed.    ED Treatments / Results  Labs (all labs ordered are listed, but only abnormal results are displayed) Labs Reviewed  COMPREHENSIVE METABOLIC PANEL - Abnormal; Notable for the following components:      Result Value   Glucose, Bld 124 (*)    Creatinine, Ser 1.15 (*)    Total Protein 6.2 (*)    Total Bilirubin 0.2 (*)    GFR calc non Af Amer 57 (*)    All other components within normal limits  ACETAMINOPHEN LEVEL - Abnormal; Notable for the following components:   Acetaminophen (Tylenol), Serum <10 (*)    All other components within  normal limits  CBC - Abnormal; Notable for the following components:   WBC 10.8 (*)    Hemoglobin 10.8 (*)    HCT 31.4 (*)    RDW 18.1 (*)    All other components within normal limits  RAPID URINE DRUG SCREEN, HOSP PERFORMED - Abnormal; Notable for the following components:   Tetrahydrocannabinol POSITIVE (*)    All other components within normal limits  ETHANOL  SALICYLATE LEVEL  I-STAT BETA HCG BLOOD, ED ([facility], [facility], [facility] ONLY)    EKG  EKG Interpretation None       Radiology No results found.  Procedures Procedures (including critical care time)  Medications Ordered in ED Medications  ondansetron (ZOFRAN) tablet 4 mg (not administered)  alum & mag hydroxide-simeth (MAALOX/MYLANTA) 200-200-20 MG/5ML suspension 30 mL (not administered)  amLODipine (NORVASC) tablet 5 mg (5 mg Oral Not Given 08/27/17 1854)  ARIPiprazole (ABILIFY) tablet 10 mg (10 mg Oral Not Given 08/27/17 1855)  aspirin chewable tablet 81 mg (81 mg Oral Not Given 08/27/17 1855)  atorvastatin (LIPITOR) tablet 80 mg (not administered)  clonazePAM (KLONOPIN) tablet 0.5 mg (0.5 mg Oral Given 08/65/78 4696)  folic acid (FOLVITE) tablet 1 mg (1 mg Oral Not Given 08/27/17 1855)  gabapentin (NEURONTIN) capsule 300-600 mg (not administered)  lisinopril (PRINIVIL,ZESTRIL) tablet 20 mg (20 mg Oral Given 08/27/17 2148)  metoprolol tartrate (LOPRESSOR) tablet 25 mg (25 mg Oral Given 08/27/17 2148)  nitroGLYCERIN (  NITROSTAT) SL tablet 0.4 mg (not administered)  pantoprazole (PROTONIX) EC tablet 40 mg (40 mg Oral Given 08/27/17 2148)  tamoxifen (NOLVADEX) tablet 20 mg (not administered)  traMADol (ULTRAM) tablet 50-100 mg (not administered)  lactobacillus acidophilus (BACID) tablet 1 tablet (not administered)  LORazepam (ATIVAN) tablet 0.5 mg (0.5 mg Oral Given 08/27/17 1853)     Initial Impression / Assessment and Plan / ED Course  I have reviewed the triage vital signs and the nursing notes.  Pertinent labs & imaging  results that were available during my care of the patient were reviewed by me and considered in my medical decision making (see chart for details).    Patient here for evaluation of suicidal thoughts.  She does have SI in the department with plan.  She does come voluntarily seeking treatment.  She has been medically cleared for psychiatric evaluation and treatment.   Final Clinical Impressions(s) / ED Diagnoses   Final diagnoses:  Suicidal ideation    ED Discharge Orders    None       Quintella Reichert, MD 08/27/17 2238

## 2017-08-27 NOTE — ED Notes (Signed)
TTS machine placed at bedside.   

## 2017-08-27 NOTE — ED Triage Notes (Signed)
Pt is here stating she has been experiencing a lot of anxiety and depression. She states she is currently in treatment for breast cancer with radiation treatments. Pt is pleasant but tearful. She states she has been feeling as if she is better off dead. Pt states she has had thoughts of hanging herself or stabbing herself.

## 2017-08-28 ENCOUNTER — Ambulatory Visit: Payer: Federal, State, Local not specified - PPO | Admitting: Internal Medicine

## 2017-08-28 ENCOUNTER — Telehealth: Payer: Self-pay | Admitting: *Deleted

## 2017-08-28 ENCOUNTER — Ambulatory Visit
Admission: RE | Admit: 2017-08-28 | Discharge: 2017-08-28 | Disposition: A | Discharge status: Home / Self Care | Payer: Federal, State, Local not specified - PPO | Source: Ambulatory Visit | Attending: Radiation Oncology | Admitting: Radiation Oncology

## 2017-08-28 ENCOUNTER — Encounter (HOSPITAL_COMMUNITY): Payer: Self-pay

## 2017-08-28 ENCOUNTER — Other Ambulatory Visit: Payer: Self-pay

## 2017-08-28 ENCOUNTER — Inpatient Hospital Stay (HOSPITAL_COMMUNITY)
Admission: EM | Admit: 2017-08-28 | Discharge: 2017-08-31 | DRG: 885 | Disposition: A | Discharge status: Home / Self Care | Payer: Federal, State, Local not specified - PPO | Source: Intra-hospital | Attending: Psychiatry | Admitting: Psychiatry

## 2017-08-28 DIAGNOSIS — I129 Hypertensive chronic kidney disease with stage 1 through stage 4 chronic kidney disease, or unspecified chronic kidney disease: Secondary | ICD-10-CM | POA: Diagnosis present

## 2017-08-28 DIAGNOSIS — Z79899 Other long term (current) drug therapy: Secondary | ICD-10-CM

## 2017-08-28 DIAGNOSIS — G43909 Migraine, unspecified, not intractable, without status migrainosus: Secondary | ICD-10-CM | POA: Diagnosis present

## 2017-08-28 DIAGNOSIS — Z915 Personal history of self-harm: Secondary | ICD-10-CM

## 2017-08-28 DIAGNOSIS — Z951 Presence of aortocoronary bypass graft: Secondary | ICD-10-CM | POA: Diagnosis not present

## 2017-08-28 DIAGNOSIS — Z170 Estrogen receptor positive status [ER+]: Secondary | ICD-10-CM | POA: Diagnosis not present

## 2017-08-28 DIAGNOSIS — F332 Major depressive disorder, recurrent severe without psychotic features: Principal | ICD-10-CM | POA: Diagnosis present

## 2017-08-28 DIAGNOSIS — N182 Chronic kidney disease, stage 2 (mild): Secondary | ICD-10-CM | POA: Diagnosis present

## 2017-08-28 DIAGNOSIS — G4700 Insomnia, unspecified: Secondary | ICD-10-CM | POA: Diagnosis present

## 2017-08-28 DIAGNOSIS — E785 Hyperlipidemia, unspecified: Secondary | ICD-10-CM | POA: Diagnosis present

## 2017-08-28 DIAGNOSIS — D649 Anemia, unspecified: Secondary | ICD-10-CM | POA: Diagnosis present

## 2017-08-28 DIAGNOSIS — Z87891 Personal history of nicotine dependence: Secondary | ICD-10-CM | POA: Diagnosis not present

## 2017-08-28 DIAGNOSIS — Z853 Personal history of malignant neoplasm of breast: Secondary | ICD-10-CM

## 2017-08-28 DIAGNOSIS — F909 Attention-deficit hyperactivity disorder, unspecified type: Secondary | ICD-10-CM | POA: Diagnosis not present

## 2017-08-28 DIAGNOSIS — Z7982 Long term (current) use of aspirin: Secondary | ICD-10-CM | POA: Diagnosis not present

## 2017-08-28 DIAGNOSIS — Z818 Family history of other mental and behavioral disorders: Secondary | ICD-10-CM | POA: Diagnosis not present

## 2017-08-28 DIAGNOSIS — F39 Unspecified mood [affective] disorder: Secondary | ICD-10-CM | POA: Diagnosis not present

## 2017-08-28 DIAGNOSIS — I10 Essential (primary) hypertension: Secondary | ICD-10-CM | POA: Diagnosis not present

## 2017-08-28 DIAGNOSIS — F419 Anxiety disorder, unspecified: Secondary | ICD-10-CM | POA: Diagnosis present

## 2017-08-28 DIAGNOSIS — I421 Obstructive hypertrophic cardiomyopathy: Secondary | ICD-10-CM | POA: Diagnosis present

## 2017-08-28 DIAGNOSIS — I252 Old myocardial infarction: Secondary | ICD-10-CM

## 2017-08-28 DIAGNOSIS — K219 Gastro-esophageal reflux disease without esophagitis: Secondary | ICD-10-CM | POA: Diagnosis present

## 2017-08-28 DIAGNOSIS — I2510 Atherosclerotic heart disease of native coronary artery without angina pectoris: Secondary | ICD-10-CM | POA: Diagnosis present

## 2017-08-28 DIAGNOSIS — Z955 Presence of coronary angioplasty implant and graft: Secondary | ICD-10-CM | POA: Diagnosis not present

## 2017-08-28 DIAGNOSIS — I2582 Chronic total occlusion of coronary artery: Secondary | ICD-10-CM | POA: Diagnosis present

## 2017-08-28 DIAGNOSIS — Z813 Family history of other psychoactive substance abuse and dependence: Secondary | ICD-10-CM | POA: Diagnosis not present

## 2017-08-28 MED ORDER — ACETAMINOPHEN 325 MG PO TABS
650.0000 mg | ORAL_TABLET | Freq: Four times a day (QID) | ORAL | Status: DC | PRN
Start: 2017-08-28 — End: 2017-08-31

## 2017-08-28 MED ORDER — LISINOPRIL 10 MG PO TABS
10.0000 mg | ORAL_TABLET | Freq: Every day | ORAL | Status: DC
  Administered 2017-08-28 – 2017-08-29 (×2): 10 mg via ORAL
  Filled 2017-08-28: qty 2
  Filled 2017-08-28 (×3): qty 1

## 2017-08-28 MED ORDER — MAGNESIUM HYDROXIDE 400 MG/5ML PO SUSP: 30.0000 mL | Freq: Every day | ORAL | Status: DC | PRN

## 2017-08-28 MED ORDER — TRAZODONE HCL 50 MG PO TABS
50.0000 mg | ORAL_TABLET | Freq: Every evening | ORAL | Status: DC | PRN
  Administered 2017-08-28 – 2017-08-30 (×3): 50 mg via ORAL
  Filled 2017-08-28 (×11): qty 1

## 2017-08-28 MED ORDER — ASPIRIN 81 MG PO CHEW
81.0000 mg | CHEWABLE_TABLET | Freq: Every day | ORAL | Status: DC
  Administered 2017-08-28 – 2017-08-31 (×4): 81 mg via ORAL
  Filled 2017-08-28 (×7): qty 1

## 2017-08-28 MED ORDER — AMLODIPINE BESYLATE 5 MG PO TABS
5.0000 mg | ORAL_TABLET | Freq: Every day | ORAL | Status: DC
  Administered 2017-08-29 – 2017-08-31 (×3): 5 mg via ORAL
  Filled 2017-08-28 (×6): qty 1

## 2017-08-28 MED ORDER — ARIPIPRAZOLE 10 MG PO TABS
10.0000 mg | ORAL_TABLET | Freq: Every day | ORAL | Status: DC
  Administered 2017-08-28 – 2017-08-31 (×4): 10 mg via ORAL
  Filled 2017-08-28 (×7): qty 1

## 2017-08-28 MED ORDER — HYDROXYZINE HCL 25 MG PO TABS: 25.0000 mg | ORAL_TABLET | Freq: Four times a day (QID) | ORAL | Status: DC | PRN

## 2017-08-28 MED ORDER — TAMOXIFEN CITRATE 10 MG PO TABS
20.0000 mg | ORAL_TABLET | Freq: Every day | ORAL | Status: DC
  Administered 2017-08-28 – 2017-08-31 (×4): 20 mg via ORAL
  Filled 2017-08-28 (×7): qty 2

## 2017-08-28 MED ORDER — GABAPENTIN 300 MG PO CAPS
300.0000 mg | ORAL_CAPSULE | Freq: Two times a day (BID) | ORAL | Status: DC
  Administered 2017-08-28 – 2017-08-31 (×6): 300 mg via ORAL
  Filled 2017-08-28 (×11): qty 1

## 2017-08-28 MED ORDER — FUROSEMIDE 20 MG PO TABS
20.0000 mg | ORAL_TABLET | Freq: Every day | ORAL | Status: DC
  Administered 2017-08-28 – 2017-08-31 (×4): 20 mg via ORAL
  Filled 2017-08-28 (×7): qty 1

## 2017-08-28 MED ORDER — OMEGA-3-ACID ETHYL ESTERS 1 G PO CAPS
2.0000 g | ORAL_CAPSULE | Freq: Two times a day (BID) | ORAL | Status: DC
  Administered 2017-08-31: 2 g via ORAL
  Filled 2017-08-28 (×10): qty 2

## 2017-08-28 MED ORDER — NITROGLYCERIN 0.4 MG SL SUBL: 0.4000 mg | SUBLINGUAL_TABLET | SUBLINGUAL | Status: DC | PRN

## 2017-08-28 MED ORDER — ATORVASTATIN CALCIUM 40 MG PO TABS
80.0000 mg | ORAL_TABLET | Freq: Every day | ORAL | Status: DC
  Administered 2017-08-29 – 2017-08-30 (×2): 80 mg via ORAL
  Filled 2017-08-28 (×2): qty 2
  Filled 2017-08-28: qty 1
  Filled 2017-08-28: qty 2
  Filled 2017-08-28: qty 1
  Filled 2017-08-28: qty 2

## 2017-08-28 NOTE — Telephone Encounter (Signed)
Spoke with mother, Jannifer Franklin, (on hippa) and explained that pt. does not have a neurologic condition that prevents her from working.  Her condition does require special considerations at work (such as working from home), but it is not disabling. Pt's recent/current hospitalization is due to behavioral health issues.  Mother verbalized understanding of same/fim

## 2017-08-28 NOTE — ED Notes (Signed)
Requested for Anna Henson w/Radiation to notify Miranda, pt is en route.

## 2017-08-28 NOTE — [facility] Group Notes (Signed)
LCSW Group Therapy Note  08/28/2017 1:15pm  Type of Therapy/Topic:  Group Therapy:  Balance in Life  Participation Level:  Active  Description of Group:    This group will address the concept of balance and how it feels and looks when one is unbalanced. Patients will be encouraged to process areas in their lives that are out of balance and identify reasons for remaining unbalanced. Facilitators will guide patients in utilizing problem-solving interventions to address and correct the stressor making their life unbalanced. Understanding and applying boundaries will be explored and addressed for obtaining and maintaining a balanced life. Patients will be encouraged to explore ways to assertively make their unbalanced needs known to significant others in their lives, using other group members and facilitator for support and feedback.  Therapeutic Goals: 1. Patient will identify two or more emotions or situations they have that consume much of in their lives. 2. Patient will identify signs/triggers that life has become out of balance:  3. Patient will identify two ways to set boundaries in order to achieve balance in their lives:  4. Patient will demonstrate ability to communicate their needs through discussion and/or role plays  Summary of Patient Progress: Participated in group discussion of concept of balance, stressors that contribute to lack of balance and emotional feeling states when out of balance. Initially reluctant to participate, did not feel she was experiencing similar stressors as others in the group.  Described significantly stressful work situation where she feels trapped by need for income vs perceived impossible demands of job.      Therapeutic Modalities:   Cognitive Behavioral Therapy Solution-Focused Therapy Assertiveness Training  Beverely Pace,  08/28/2017 2:48 PM

## 2017-08-28 NOTE — Progress Notes (Signed)
Recreation Therapy Notes    Animal-Assisted Activity (AAA) Program Checklist/Progress Notes Patient Eligibility Criteria Checklist & Daily Group note for Rec TxIntervention  Date: 11.20.2018 Time: 2:45am Location: [address]   AAA/T Program Assumption of Risk Form signed by Patient/ or Parent Legal Guardian Yes  Patient is free of allergies or sever asthma Yes  Patient reports no fear of animals Yes  Patient reports no history of cruelty to animals Yes  Patient understands his/her participation is voluntary Yes  Behavioral Response: Did not attend.   [name] Anna Henson, LRT/CTRS        [name] L 08/28/2017 3:12 PM

## 2017-08-28 NOTE — ED Notes (Signed)
Pt states she cannot have pork products, added to allergy list.

## 2017-08-28 NOTE — Tx Team (Signed)
Initial Treatment Plan 08/28/2017 2:32 PM [name] [id number]    PATIENT STRESSORS: Health problems Other: stressful job   PATIENT STRENGTHS: Capable of independent living Social worker for treatment/growth Supportive family/friends   PATIENT IDENTIFIED PROBLEMS: Depression  Anxiety  Substance abuse  "Be able to cope better."  "Be stress free at her work place"             DISCHARGE CRITERIA:  Ability to meet basic life and health needs Improved stabilization in mood, thinking, and/or behavior Medical problems require only outpatient monitoring Motivation to continue treatment in a less acute level of care Safe-care adequate arrangements made Verbal commitment to aftercare and medication compliance  PRELIMINARY DISCHARGE PLAN: Attend aftercare/continuing care group Outpatient therapy Return to previous living arrangement Return to previous work or school arrangements  PATIENT/FAMILY INVOLVEMENT: This treatment plan has been presented to and reviewed with the patient, Anna Henson, and/or family member.  The patient and family have been given the opportunity to ask questions and make suggestions.  Wolfgang Phoenix, RN 08/28/2017, 2:32 PM

## 2017-08-28 NOTE — Telephone Encounter (Signed)
Yes, sorry...[phone]  Thank you!

## 2017-08-28 NOTE — ED Notes (Signed)
Baker Janus, Staffing Office, aware Sitter riding w/pt to [facility] then to [facility]

## 2017-08-28 NOTE — Telephone Encounter (Signed)
Pt's mother called said the pt is in the hospital for mental issues. Pt's job is trying to expedite her disability for her. They are needing a letter stating her dx and prognosis, sent to [facility], she only has his phone number (p) [phone]. She said her job does not feel she should be working anymore.

## 2017-08-28 NOTE — ED Notes (Signed)
Pt signed consent forms for [facility] - copy faxed to [facility], original placed in envelope, and copy sent to Medical Records.

## 2017-08-28 NOTE — ED Notes (Addendum)
Pt voiced agreement w/tx plan - accepted to [facility] - Dr Parke Poisson. Pt called her mother and advised of tx plan - accepted to [facility] - will go to [facility] for radiation first then to [facility]. RN also spoke w/her mother, per pt request - Mother voiced agreement of tx plan. RN spoke w/Miranda, [place] - who advised pt has an appt at 1615 but that pt may arrive at any time. Advised pt receives radiation daily M-F but they will be closed on 11-23 and 11-24 d/t holiday. Festus Barren, [facility], aware. Attempted to call report to [facility] - advised to call back.

## 2017-08-28 NOTE — Progress Notes (Signed)
Anna Henson is a 46 y.o. female Voluntary admitted for suicide thoughts from [facility]. Pt was calm and cooperative during admission process. Pt stated  she has been dealing with stress and depression, but felt like something has not been right for the past one week. Pt live a lone, but moves in with her mother when she feel more depressed. 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 Pt also stated she has numerus medical problems she is dealing with. She has right breast cancer which requires radiology every day, suffered heart attack in march and history of HTN.  Pt stated she tried to kill her self by slitting her wrist when she was 26 years. Pt would like help with coping skills and to be stress free from stress. Consents signed, skin/belongings search completed and pt oriented to unit. Pt stable at this time. Pt given the opportunity to express concerns and ask questions. Pt given toiletries. Will continue to monitor.

## 2017-08-28 NOTE — Progress Notes (Signed)
Per Leonia Reader , [facility], patient has been accepted to [facility], bed 406-2 ; Accepting provider is Elmarie Shiley, NP; Attending provider is Dr. Parke Poisson.  Patient can arrive after receiving radiation treatment. RN aware. Number for report is [phone].    Eber Hong, RN notified.    Radonna Ricker MSW, [place] Disposition [phone]
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
## 2017-08-28 NOTE — Telephone Encounter (Signed)
Called and spoke with ja?,couldn't get the female name right, patient is comuing back, just finished radiation, also have her printed schedule and gave to the nurse tech [place],  Patient tomorrow rad tx is for 1145am, we are clsed Thursday and Friday this week, , female thanked [place] for the call 10:11 AM

## 2017-08-29 ENCOUNTER — Ambulatory Visit
Admission: RE | Admit: 2017-08-29 | Discharge: 2017-08-29 | Disposition: A | Discharge status: Home / Self Care | Payer: Federal, State, Local not specified - PPO | Source: Ambulatory Visit | Attending: Radiation Oncology | Admitting: Radiation Oncology

## 2017-08-29 ENCOUNTER — Other Ambulatory Visit: Payer: Self-pay | Admitting: Internal Medicine

## 2017-08-29 DIAGNOSIS — Z818 Family history of other mental and behavioral disorders: Secondary | ICD-10-CM

## 2017-08-29 DIAGNOSIS — F909 Attention-deficit hyperactivity disorder, unspecified type: Secondary | ICD-10-CM

## 2017-08-29 DIAGNOSIS — I10 Essential (primary) hypertension: Secondary | ICD-10-CM

## 2017-08-29 DIAGNOSIS — E785 Hyperlipidemia, unspecified: Secondary | ICD-10-CM

## 2017-08-29 DIAGNOSIS — Z813 Family history of other psychoactive substance abuse and dependence: Secondary | ICD-10-CM

## 2017-08-29 DIAGNOSIS — F332 Major depressive disorder, recurrent severe without psychotic features: Principal | ICD-10-CM

## 2017-08-29 DIAGNOSIS — F419 Anxiety disorder, unspecified: Secondary | ICD-10-CM

## 2017-08-29 MED ORDER — PRASUGREL HCL 10 MG PO TABS
10.0000 mg | ORAL_TABLET | Freq: Every day | ORAL | Status: DC
  Administered 2017-08-29 – 2017-08-31 (×3): 10 mg via ORAL
  Filled 2017-08-29 (×5): qty 1

## 2017-08-29 MED ORDER — SERTRALINE HCL 25 MG PO TABS
25.0000 mg | ORAL_TABLET | Freq: Every day | ORAL | Status: DC
  Administered 2017-08-29 – 2017-08-30 (×2): 25 mg via ORAL
  Filled 2017-08-29 (×4): qty 1

## 2017-08-29 MED ORDER — ENSURE ENLIVE PO LIQD: 237.0000 mL | Freq: Two times a day (BID) | ORAL | Status: DC | PRN

## 2017-08-29 MED ORDER — LISINOPRIL 20 MG PO TABS
20.0000 mg | ORAL_TABLET | Freq: Two times a day (BID) | ORAL | Status: DC
  Administered 2017-08-29 – 2017-08-31 (×4): 20 mg via ORAL
  Filled 2017-08-29 (×8): qty 1

## 2017-08-29 MED ORDER — BACLOFEN 10 MG PO TABS
10.0000 mg | ORAL_TABLET | Freq: Four times a day (QID) | ORAL | Status: DC | PRN
  Administered 2017-08-29 – 2017-08-30 (×3): 10 mg via ORAL
  Filled 2017-08-29 (×3): qty 1

## 2017-08-29 MED ORDER — FOLIC ACID 1 MG PO TABS
1.0000 mg | ORAL_TABLET | Freq: Every day | ORAL | Status: DC
  Administered 2017-08-29 – 2017-08-31 (×3): 1 mg via ORAL
  Filled 2017-08-29 (×5): qty 1

## 2017-08-29 NOTE — Progress Notes (Signed)
Patient ID: Anna Henson, female   DOB: 1971/06/12, 46 y.o.   MRN: [id number]  Writer contacted [facility] to transport patient to her appointment at the Cancer center. Per Betsy Pries, they are to be here to pick pt up at 1130.  Writer notified MD Izediuno of patient's elevated BP this morning, during treatment team. Patient was administered her BP medications. See MAR for new medications.

## 2017-08-29 NOTE — Progress Notes (Signed)
Recreation Therapy Notes  Date: 08/29/17 Time: 0930 Location: 500 Hall Dayroom  Group Topic: Stress Management  Goal Area(s) Addresses:  Patient will verbalize importance of using healthy stress management.  Patient will identify positive emotions associated with healthy stress management.   Behavioral Response: Engaged  Intervention: Stress Management  Activity :  Body Scan.  LRT introduced the stress management technique of guided body scan.  LRT played a body scan meditation from the Calm app that allowed patients to focus on whatever sensations they may have been feeling and what those sensations felt like to them.   Education:  Stress Management, Discharge Planning.   Education Outcome: Acknowledges edcuation/In group clarification offered/Needs additional education  Clinical Observations/Feedback: Pt attended group.   Victorino Sparrow, LRT/CTRS         Victorino Sparrow A 08/29/2017 12:25 PM

## 2017-08-29 NOTE — Progress Notes (Signed)
Patient ID: Anna Henson, female   DOB: May 17, 1971, 46 y.o.   MRN: [id number]  Patient returned to unit via [facility] transportation.

## 2017-08-29 NOTE — Progress Notes (Signed)
Patient ID: Anna Henson, female   DOB: 1971/05/11, 46 y.o.   MRN: [id number]  MHT Caren C and patient were transported to the [facility] by [facility] transportation. Patient to transport back to [facility] after her appointment.

## 2017-08-29 NOTE — BHH Suicide Risk Assessment (Signed)
[facility] Admission Suicide Risk Assessment   Nursing information obtained from:    Demographic factors:    Current Mental Status:    Loss Factors:    Historical Factors:    Risk Reduction Factors:     Total Time spent with patient: 30 minutes Principal Problem: MDD (major depressive disorder), recurrent episode, severe ([place]) Diagnosis:   Patient Active Problem List   Diagnosis Date Noted  . MDD (major depressive disorder), recurrent episode, severe ([place]) [F33.2] 08/28/2017  . Neck stiffness [M43.6] 08/22/2017  . Abnormal finding on MRI of brain [R90.89] 08/22/2017  . HOCM (hypertrophic obstructive cardiomyopathy) ([place]) [I42.1]   . Chest pain [R07.9] 05/06/2017  . Genetic testing [Z13.79] 04/23/2017  . Malignant neoplasm of lower-outer quadrant of right breast of female, estrogen receptor positive ([place]) [C50.511, Z17.0] 03/29/2017  . Dysesthesia [R20.8] 03/12/2017  . Palpitations [R00.2] 02/23/2017  . S/P cardiac cath (12/2016) a. acute total occlusion of the RCA tx w/PCI DES, b. mild nonobs LAD/LCx stenosis, c. mild segmental LV sys dx w/ sev hypokinesis, LVEF 50-55% [Z98.890] 01/02/2017  . Non-ST elevation myocardial infarction (NSTEMI), subsequent episode of care ([place]) [I21.4] 12/31/2016  . 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 Chronic kidney disease [N18.9]   . ST elevation myocardial infarction involving right coronary artery ([place]) [I21.11]   . Abnormal EKG [R94.31]   . Nausea [R11.0]   . Ischemic cardiomyopathy [I25.5]   . Muscle spasms of both lower extremities [M62.838] 04/17/2016  . Gait disturbance [R26.9] 01/25/2016  . Transverse myelitis ([place]) [G37.3] 12/14/2015  . Acute-on-chronic kidney injury ([place]) [N17.9, N18.9] 11/12/2015  . Neck pain [M54.2] 11/12/2015  . Hypokalemia [E87.6] 11/12/2015  . Abnormal MRI, neck [R93.89] 11/12/2015  . Numbness [R20.0] 11/12/2015  . Hypertension [I10]   . Hyperlipidemia [E78.5]   . Coronary artery disease involving native coronary artery of  native heart without angina pectoris [I25.10]   . Facial numbness [R20.0]   . Right arm numbness [R20.2]   . Throat pain [R07.0] 11/09/2015  . Drooling [K11.7] 11/09/2015  . CAD (coronary artery disease), native coronary artery [I25.10] 09/15/2014  . Headache [R51] 09/15/2014  . UTI (urinary tract infection): Probable [N39.0] 09/14/2014  . Candida infection of genital region [B37.49] 09/14/2014  . Unstable angina ([place]) [I20.0] 09/13/2014  . HLD (hyperlipidemia) [E78.5] 09/13/2014  . Otitis [H66.90] 09/13/2014  . ACS (acute coronary syndrome) ([place]) [I24.9] 09/13/2014  . Tobacco abuse [Z72.0] 09/13/2014  . Essential hypertension [I10] 09/09/2014   Subjective Data:  46 y.o AAF female, single, lives alone. Employed. Background history of Unipolar depression and multiple medical comorbidity. Presented to the ER in company of her mom . Reports increasing depression and anxiety. She has been feeling more hopeless. She was recently started on Abilify and felt worse on the medication. She had expressed thoughts to hang herself or stab herself. Routine labs is significant for chronic renal impairment, anemia and mildly elevated WBC.  UDS is positive for THC. No alcohol.  Patient is not pervasively depressed. She is insightful. No family history of suicide, no evidence of psychosis. No evidence of mania. No cognitive impairment. No access to weapons. She is cooperative with care. She has agreed to treatment recommendations. She has agreed to communicate suicidal thoughts of with staff if the thoughts becomes overwhelming.     Continued Clinical Symptoms:  Alcohol Use Disorder Identification Test Final Score (AUDIT): 2 The "Alcohol Use Disorders Identification Test", Guidelines for Use in Primary Care, Second Edition.  World Pharmacologist [facility]). Score between 0-7:  no or low risk or alcohol related problems. Score between 8-15:  moderate risk of alcohol related problems. Score between 16-19:   high risk of alcohol related problems. Score 20 or above:  warrants further diagnostic evaluation for alcohol dependence and treatment.   CLINICAL FACTORS:   Depression:   Hopelessness   Musculoskeletal: Strength & Muscle Tone: within normal limits Gait & Station: normal Patient leans: N/A  Psychiatric Specialty Exam: Physical Exam  ROS  Blood pressure (!) 155/88, pulse 78, temperature 97.9 F (36.6 C), temperature source Oral, resp. rate 16, height 5\' 1"  (1.549 m), weight 61.2 kg (135 lb), last menstrual period 08/02/2017, SpO2 100 %.Body mass index is 25.51 kg/m.  General Appearance: As in H&P  Eye Contact:    Speech:    Volume:    Mood:    Affect:    Thought Process:    Orientation:  As in H&P  Thought Content:    Suicidal Thoughts:    Homicidal Thoughts:    Memory:    Judgement:    Insight:  As in H&P  Psychomotor Activity:    Concentration:    Recall:    Fund of Knowledge:    Language:    Akathisia:    Handed:    AIMS (if indicated):     Assets:    ADL's:    Cognition:  As in H&P  Sleep:  Number of Hours: 6.5      COGNITIVE FEATURES THAT CONTRIBUTE TO RISK:  None    SUICIDE RISK:   Minimal: No identifiable suicidal ideation.  Patients presenting with no risk factors but with morbid ruminations; may be classified as minimal risk based on the severity of the depressive symptoms  PLAN OF CARE:  As in H&P  I certify that inpatient services furnished can reasonably be expected to improve the patient's condition.   [place], MD 08/29/2017, 2:10 PM

## 2017-08-29 NOTE — Progress Notes (Signed)
D: Patient denies SI, HI or AVH this evening. Patient presents as flat and depressed but is very pleasant and brightens with interaction stating, "everyone has been so nice and I'd like to go home but I know I need to be here to get my medication right".  Pt. States that she went for her radiation treatment on 08/28/17 and was inquiring about the next days treatment she is scheduled for.  Assured patient that I would pass her concerns on to the day shift.  Pt. Received her medication as scheduled and denied any new concerns.  A: Patient given emotional support from RN. Patient encouraged to come to staff with concerns and/or questions. Patient's medication routine continued. Patient's orders and plan of care reviewed.   R: Patient remains appropriate and cooperative. Will continue to monitor patient q15 minutes for safety.

## 2017-08-29 NOTE — Progress Notes (Signed)
Patient ID: Anna Henson, female   DOB: 1971/04/02, 46 y.o.   MRN: [id number]  DAR: Pt. Denies SI/HI and A/V Hallucinations. She reports that her sleep last night was fair, her energy level is fair, her energy level is normal, and her concentration is good. She rates her depression level 1/10, hopelessness level 0/10, and anxiety level 3/10. Patient does report pain in her legs and was given PRN Baclofen which was effective. Support and encouragement provided to the patient. Scheduled medications administered to patient per physician's orders. Patient is seen in the milieu however appears to be keeping to herself mostly. Her affect is flat and mood depressed but pleasant at this time. Her BP remains elevated however asymptomatic at this time. Q15 minute checks are maintained for safety.

## 2017-08-29 NOTE — BHH Group Notes (Signed)
LCSW Group Therapy Note  08/29/2017 1:15pm  Type of Therapy and Topic:  Group Therapy:  Feelings around Relapse and Recovery  Participation Level: Active   Description of Group:    Patients in this group will discuss emotions they experience before and after a relapse. They will process how experiencing these feelings, or avoidance of experiencing them, relates to having a relapse. Facilitator will guide patients to explore emotions they have related to recovery. Patients will be encouraged to process which emotions are more powerful. They will be guided to discuss the emotional reaction significant others in their lives may have to their relapse or recovery. Patients will be assisted in exploring ways to respond to the emotions of others without this contributing to a relapse.  Therapeutic Goals: 1. Patient will identify two or more emotions that lead to a relapse for them 2. Patient will identify two emotions that result when they relapse 3. Patient will identify two emotions related to recovery 4. Patient will demonstrate ability to communicate their needs through discussion and/or role plays   Therapeutic Modalities:   Cognitive Behavioral Therapy Solution-Focused Therapy Assertiveness Training Relapse Prevention Therapy   Georga Kaufmann, MSW, [facility] 08/29/2017 3:56 PM

## 2017-08-29 NOTE — BHH Suicide Risk Assessment (Signed)
[place] INPATIENT:  Family/Significant Other Suicide Prevention Education  Suicide Prevention Education:  Education Completed; Meryle Ready, mother, [phone],  (name of family member/significant other) has been identified by the patient as the family member/significant other with whom the patient will be residing, and identified as the person(s) who will aid the patient in the event of a mental health crisis (suicidal ideations/suicide attempt).  With written consent from the patient, the family member/significant other has been provided the following suicide prevention education, prior to the and/or following the discharge of the patient.  The suicide prevention education provided includes the following:  Suicide risk factors  Suicide prevention and interventions  National Suicide Hotline telephone number  [facility] assessment telephone number  [facility] Emergency Assistance [place] and/or Residential Mobile Crisis Unit telephone number  Request made of family/significant other to:  Remove weapons (e.g., guns, rifles, knives), all items previously/currently identified as safety concern.    Remove drugs/medications (over-the-counter, prescriptions, illicit drugs), all items previously/currently identified as a safety concern.  The family member/significant other verbalizes understanding of the suicide prevention education information provided.  The family member/significant other agrees to remove the items of safety concern listed above.  Per mother, patient has an extremely stressful job "I don't know how people work to the [place], it's impossible, one person on her job committed suicide."  Mother encouraging patient to quit her job "she is going to lose her mind in this job, if you make 3 mistakes, you are written up, so much pressure, I've told her you have got to save you life, the only way you can do that is to leave the [place]."  Patient "wants to be  productive."    Mother has spoken w patient while inpatient, "I'm thinking she might be ready to come out, but the thing I want her to know is that she needs to be ready to not go spinning downward again."  Has applied for disability immediately prior to current admission.  "I don't want her to have those feelings of 'I could hang myself', hearing her voice telling her to hurt herself."  Mother wants to know voices are gone prior to discharge. Mother aware that patient has signed a 72 hour request for discharge.  "I just want her to be all right, I will help her in any way I can."      Beverely Pace 08/29/2017, 9:53 AM

## 2017-08-29 NOTE — H&P (Signed)
Psychiatric Admission Assessment Adult  Patient Identification: Anna Henson MRN:  [id number] Date of Evaluation:  08/29/2017 Chief Complaint:  Worsening depression with suicidal thoughts Principal Diagnosis: MDD Diagnosis:   Patient Active Problem List   Diagnosis Date Noted  . MDD (major depressive disorder), recurrent episode, severe ([place]) [F33.2] 08/28/2017  . Neck stiffness [M43.6] 08/22/2017  . Abnormal finding on MRI of brain [R90.89] 08/22/2017  . HOCM (hypertrophic obstructive cardiomyopathy) ([place]) [I42.1]   . Chest pain [R07.9] 05/06/2017  . Genetic testing [Z13.79] 04/23/2017  . Malignant neoplasm of lower-outer quadrant of right breast of female, estrogen receptor positive ([place]) [C50.511, Z17.0] 03/29/2017  . Dysesthesia [R20.8] 03/12/2017  . Palpitations [R00.2] 02/23/2017  . S/P cardiac cath (12/2016) a. acute total occlusion of the RCA tx w/PCI DES, b. mild nonobs LAD/LCx stenosis, c. mild segmental LV sys dx w/ sev hypokinesis, LVEF 50-55% [Z98.890] 01/02/2017  . Non-ST elevation myocardial infarction (NSTEMI), subsequent episode of care ([place]) [I21.4] 12/31/2016  . 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 Chronic kidney disease [N18.9]   . ST elevation myocardial infarction involving right coronary artery ([place]) [I21.11]   . Abnormal EKG [R94.31]   . Nausea [R11.0]   . Ischemic cardiomyopathy [I25.5]   . Muscle spasms of both lower extremities [M62.838] 04/17/2016  . Gait disturbance [R26.9] 01/25/2016  . 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 Acute-on-chronic kidney injury ([place]) [N17.9, N18.9] 11/12/2015  . Neck pain [M54.2] 11/12/2015  . Hypokalemia [E87.6] 11/12/2015  . Abnormal MRI, neck [R93.89] 11/12/2015  . Numbness [R20.0] 11/12/2015  . Hypertension [I10]   . Hyperlipidemia [E78.5]   . Coronary artery disease involving native coronary artery of native heart without angina pectoris [I25.10]   . Facial numbness [R20.0]   . Right arm numbness [R20.2]   .  Throat pain [R07.0] 11/09/2015  . Drooling [K11.7] 11/09/2015  . CAD (coronary artery disease), native coronary artery [I25.10] 09/15/2014  . Headache [R51] 09/15/2014  . UTI (urinary tract infection): Probable [N39.0] 09/14/2014  . Candida infection of genital region [B37.49] 09/14/2014  . Unstable angina ([place]) [I20.0] 09/13/2014  . HLD (hyperlipidemia) [E78.5] 09/13/2014  . Otitis [H66.90] 09/13/2014  . ACS (acute coronary syndrome) ([place]) [I24.9] 09/13/2014  . Tobacco abuse [Z72.0] 09/13/2014  . Essential hypertension [I10] 09/09/2014   History of Present Illness:  46 y.o AAF female, single, lives alone. Employed. Background history of Unipolar depression and multiple medical comorbidity. Presented to the ER in company of her mom . Reports increasing depression and anxiety. She has been feeling more hopeless. She was recently started on Abilify and felt worse on the medication. She had expressed thoughts to hang herself or stab herself. Routine labs is significant for chronic renal impairment, anemia and mildly elevated WBC.  UDS is positive for THC. No alcohol.    At interview, patient reports that she had been doing okay mentally until she started having medical issues. Says she was initially diagnosed with a spinal problem. She later had a heart attack and subsequently had cardiac surgery. Says she was diagnosed with breast cancer three weeks ago. Patient says she felt overwhelmed and had some bad thoughts. Says her mother encouraged her to seek help. Patient says she has been able to process things well. Says she feels her job is too stressful. 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 Says she has asked her boss to reassign her to a less demanding position with less pay. Patient says she has decided she would resign her position and sign onto  disability if her wishes are not respected. Says she cannot imagine putting her mother through the grief of suicide. Says she has no intent to harm herself.  Says the thoughts were passive wish that she was no longer alive. Says she feels more powerful now she has a plan on how to make things better. Says actually the Abilify has helped her. Patient reports normal sleep at night and appetite. Says she feels more energetic. Says she is able to think clearly. No difficulties remembering things. She does not have any violent thoughts. No homicidal thoughts. Patient denies any other stressors.    Total Time spent with patient: 1 hour  Past Psychiatric History: No past history of mental illness. Was not on any psychotropic medication until a couple of weeks ago. Has never been tried on any antidepressant. Single episode of slitting her wrist in her 20's. 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 No past history of violent behavior.   Is the patient at risk to self? No.  Has the patient been a risk to self in the past 6 months? No.  Has the patient been a risk to self within the distant past? No.  Is the patient a risk to others? No.  Has the patient been a risk to others in the past 6 months? No.  Has the patient been a risk to others within the distant past? No.   Prior Inpatient Therapy:   Prior Outpatient Therapy:    Alcohol Screening: 1. How often do you have a drink containing alcohol?: 2 to 4 times a month 2. How many drinks containing alcohol do you have on a typical day when you are drinking?: 1 or 2 3. How often do you have six or more drinks on one occasion?: Never AUDIT-C Score: 2 4. How often during the last year have you found that you were not able to stop drinking once you had started?: Never 5. How often during the last year have you failed to do what was normally expected from you becasue of drinking?: Never 6. How often during the last year have you needed a first drink in the morning to get yourself going after a heavy drinking session?: Never 7. How often during the last year have you had a feeling of guilt  of remorse after drinking?: Never 8. How often during the last year have you been unable to remember what happened the night before because you had been drinking?: Never 9. Have you or someone else been injured as a result of your drinking?: No 10. Has a relative or friend or a doctor or another health worker been concerned about your drinking or suggested you cut down?: No Alcohol Use Disorder Identification Test Final Score (AUDIT): 2 Intervention/Follow-up: AUDIT Score <7 follow-up not indicated Substance Abuse History in the last 12 months:  No. Consequences of Substance Abuse: NA Previous Psychotropic Medications: Yes  Psychological Evaluations: Yes  Past Medical History:  Past Medical History:  Diagnosis Date  . Anxiety   . Bipolar disorder ([place])   . Breast cancer, right breast ([place]) ~ 03/2017  . CAD in native artery    a. 09/2014 Cath/PCI in setting of [place] - s/p 2.25 x 12 mm Promus Premier DES to the RCA;  b. 11/2016 Myoview: EF 56%, no ischemia;  c. 12/2016 NSTEMI/PCI: LM nl, LAd 25p, LCX 30p, RCA 100p (2.75x38 Promus Premier DES), mRCA 10 ISR, EF 50-55%.  . Chronic kidney disease  she sthinks has stage 2 CKD, has had US doppler and has some stenosis , this was done in [place]   . CKD (chronic kidney disease), stage II   . Depression   . Dysrhythmia    Tachycardia in evenings  . Genetic testing 04/23/2017   Ms. Mittag underwent genetic counseling and testing for hereditary cancer syndromes on 04/05/2017. Her results were negative for mutations in all 46 genes analyzed by [facility]'s 46-gene Common Hereditary Cancers Panel. Genes analyzed include: APC, ATM, AXIN2, BARD1, BMPR1A, BRCA1, BRCA2, BRIP1, CDH1, CDKN2A, CHEK2, CTNNA1, DICER1, EPCAM, GREM1, HOXB13, KIT, MEN1, MLH1, MSH2, MSH3, MSH6, MUTYH, NBN,  . GERD (gastroesophageal reflux disease)   . Headache    "bi-weekly" (05/07/2017)  . Heart attack ([place]) 12/31/2016  . HOCM (hypertrophic obstructive cardiomyopathy) ([place])    a.  12/2016 Echo: EF 65-70%,  mod conc LVH, dynamic obstruction @ rest, peak velocity of 291 cm/sec w/ peak gradient of 43mHg, no rwma, Gr1 DD, triv TR, PASP 165mg.  . Marland Kitchenyperlipidemia   . Hypertension    > 5 years  . Migraine    "probably 1/month" (05/07/2017)  . Palpitations   . Pneumonia    "twice when I was a child" (05/07/2017)  . Tobacco abuse 09/13/2014  . Transverse myelitis ([facility]    Past Surgical History:  Procedure Laterality Date  . ABDOMINAL HERNIA REPAIR  ~ 2001; ~ 2005  . BREAST BIOPSY Right ~ 03/2017 X 2  . BREAST LUMPECTOMY WITH RADIOACTIVE SEED AND SENTINEL LYMPH NODE BIOPSY Right 06/21/2017   Procedure: RIGHT BREAST BRACKETED SEED GUIDED LUMPECTOMY AND SENTINEL LYMPH NODE BIOPSY;  Surgeon: ToJovita KussmaulMD;  [facility] Service: General;  Laterality: Right;  . CORONARY ANGIOPLASTY WITH STENT PLACEMENT     L main OK, LAD OK, CFX 30%, RCA 90%-0% w/ 2.25 x 12 mm Promus Premier DES, EF 55%  . CORONARY/GRAFT ACUTE MI REVASCULARIZATION N/A 12/31/2016   Procedure: Coronary/Graft Acute MI Revascularization;  Surgeon: MiSherren MochaMD;  [facility];  Service: Cardiovascular;  Laterality: N/A;  . HERNIA REPAIR     times 2  umbilical  . LEFT HEART CATH AND CORONARY ANGIOGRAPHY N/A 12/31/2016   Procedure: Left Heart Cath and Coronary Angiography;  Surgeon: MiSherren MochaMD;  [facility];  Service: Cardiovascular;  Laterality: N/A;  . LEFT HEART CATHETERIZATION WITH CORONARY ANGIOGRAM N/A 09/14/2014   Procedure: LEFT HEART CATHETERIZATION WITH CORONARY ANGIOGRAM;  Surgeon: ChBurnell BlanksMD;  [facility];  Service: Cardiovascular;  Laterality: N/A;  . PERCUTANEOUS CORONARY STENT INTERVENTION (PCI-S)  09/14/2014   Procedure: PERCUTANEOUS CORONARY STENT INTERVENTION (PCI-S);  Surgeon: ChBurnell BlanksMD;  [facility];  Service: Cardiovascular;;  . TONSILLECTOMY  2005   Family History:  Family History   Problem Relation Age of Onset  . Diabetes Mother   . Heart disease Mother   . Hyperlipidemia Mother   . Hypertension Mother   . Lung cancer Father   . Drug abuse Father   . Brain cancer Father [id number]. Bone cancer Father [id number]. Drug abuse Sister   . Mental illness Brother   . Mental illness Maternal Grandmother   . Stomach cancer Paternal Grandmother [id number]     d.75   Family Psychiatric  History: As in H&P Tobacco Screening: Have you used any form of tobacco in the last 30 days? (Cigarettes, Smokeless Tobacco, Cigars, and/or Pipes): No Social  History:  Social History   Substance and Sexual Activity  Alcohol Use Yes  . Alcohol/week: 1.2 oz  . Types: 2 Cans of beer per week     Social History   Substance and Sexual Activity  Drug Use No    Additional Social History: Marital status: Single Are you sexually active?: No What is your sexual orientation?: heterosexual Has your sexual activity been affected by drugs, alcohol, medication, or emotional stress?: no Does patient have children?: No    Pain Medications: see MAR Prescriptions: see MAR Over the Counter: see MAR History of alcohol / drug use?: Yes Longest period of sobriety (when/how long): Pt. began drinking at 14. Pt. reports that she has not had a 30 day period of sobriety since that time. Pt. currently drinking 4-5 shots 3X a week Name of Substance 1: alcohol  1 - Age of First Use: UTA 1 - Amount (size/oz): UTA 1 - Frequency: UTA 1 - Duration: UTA 1 - Last Use / Amount: a drink at dinner about a week ago - was drinking hard liquor- 3 to4 times a week, 4 to 5 drinks  Name of Substance 2: cannabis  2 - Age of First Use: uTA 2 - Amount (size/oz): UTA 2 - Frequency: UTA 2 - Duration: UTA 2 - Last Use / Amount: UTA                Allergies:   Allergies  Allergen Reactions  . Pork-Derived Products    Lab Results:  Results for orders placed or performed during the hospital encounter of 08/27/17 (from the  past 48 hour(s))  I-Stat beta hCG blood, ED     Status: None   Collection Time: 08/27/17  1:24 PM  Result Value Ref Range   I-stat hCG, quantitative <5.0 <5 mIU/mL   Comment 3            Comment:   GEST. AGE      CONC.  (mIU/mL)   <=1 WEEK        5 - 50     2 WEEKS       50 - 500     3 WEEKS       100 - 10,000     4 WEEKS     1,000 - 30,000        FEMALE AND NON-PREGNANT FEMALE:     LESS THAN 5 mIU/mL   Rapid urine drug screen (hospital performed)     Status: Abnormal   Collection Time: 08/27/17  1:57 PM  Result Value Ref Range   Opiates NONE DETECTED NONE DETECTED   Cocaine NONE DETECTED NONE DETECTED   Benzodiazepines NONE DETECTED NONE DETECTED   Amphetamines NONE DETECTED NONE DETECTED   Tetrahydrocannabinol POSITIVE (A) NONE DETECTED   Barbiturates NONE DETECTED NONE DETECTED    Comment:        DRUG SCREEN FOR MEDICAL PURPOSES ONLY.  IF CONFIRMATION IS NEEDED FOR ANY PURPOSE, NOTIFY LAB WITHIN 5 DAYS.        LOWEST DETECTABLE LIMITS FOR URINE DRUG SCREEN Drug Class       Cutoff (ng/mL) Amphetamine      1000 Barbiturate      200 Benzodiazepine   124 Tricyclics       580 Opiates          300 Cocaine          300 THC  50     Blood Alcohol level:  Lab Results  Component Value Date   ETH <10 88/50/2774    Metabolic Disorder Labs:  Lab Results  Component Value Date   HGBA1C 5.8 02/22/2016   No results found for: PROLACTIN Lab Results  Component Value Date   CHOL 120 06/20/2017   TRIG 70 06/20/2017   HDL 37 (L) 06/20/2017   CHOLHDL 3.2 06/20/2017   VLDL 20 12/31/2016   LDLCALC 69 06/20/2017   LDLCALC 83 12/31/2016    Current Medications: Current Facility-Administered Medications  Medication Dose Route Frequency Provider Last Rate Last Dose  . acetaminophen (TYLENOL) tablet 650 mg  650 mg Oral Q6H PRN Patriciaann Clan E, PA-C      . amLODipine (NORVASC) tablet 5 mg  5 mg Oral Daily Derrill Center, NP   5 mg at 08/29/17 0820  . ARIPiprazole  (ABILIFY) tablet 10 mg  10 mg Oral Daily Derrill Center, NP   10 mg at 08/29/17 0820  . aspirin chewable tablet 81 mg  81 mg Oral Daily Derrill Center, NP   81 mg at 08/29/17 0819  . atorvastatin (LIPITOR) tablet 80 mg  80 mg Oral q1800 Derrill Center, NP      . baclofen (LIORESAL) tablet 10 mg  10 mg Oral QID PRN Artist Beach, MD   10 mg at 08/29/17 1128  . feeding supplement (ENSURE ENLIVE) (ENSURE ENLIVE) liquid 237 mL  237 mL Oral BID PRN Cobos, Myer Peer, MD      . folic acid (FOLVITE) tablet 1 mg  1 mg Oral Daily Aziz Slape A, MD   1 mg at 08/29/17 1128  . furosemide (LASIX) tablet 20 mg  20 mg Oral Daily Derrill Center, NP   20 mg at 08/29/17 0820  . gabapentin (NEURONTIN) capsule 300 mg  300 mg Oral BID Derrill Center, NP   300 mg at 08/29/17 0820  . hydrOXYzine (ATARAX/VISTARIL) tablet 25 mg  25 mg Oral Q6H PRN Laverle Hobby, PA-C      . lisinopril (PRINIVIL,ZESTRIL) tablet 20 mg  20 mg Oral BID Ameah Chanda A, MD      . magnesium hydroxide (MILK OF MAGNESIA) suspension 30 mL  30 mL Oral Daily PRN Patriciaann Clan E, PA-C      . nitroGLYCERIN (NITROSTAT) SL tablet 0.4 mg  0.4 mg Sublingual Q5 min PRN Derrill Center, NP      . omega-3 acid ethyl esters (LOVAZA) capsule 2 g  2 g Oral BID Derrill Center, NP      . prasugrel (EFFIENT) tablet 10 mg  10 mg Oral Daily Elmira Olkowski, Laruth Bouchard, MD   10 mg at 08/29/17 1253  . tamoxifen (NOLVADEX) tablet 20 mg  20 mg Oral Daily Derrill Center, NP   20 mg at 08/29/17 1287  . traZODone (DESYREL) tablet 50 mg  50 mg Oral QHS,MR X 1 Laverle Hobby, PA-C   50 mg at 08/28/17 2138   Facility-Administered Medications Ordered in Other Encounters  Medication Dose Route Frequency Provider Last Rate Last Dose  . gadopentetate dimeglumine (MAGNEVIST) injection 16 mL  16 mL Intravenous Once PRN Sater, Nanine Means, MD       PTA Medications: Medications Prior to Admission  Medication Sig Dispense Refill Last Dose  . amLODipine  (NORVASC) 5 MG tablet Take 1 tablet (5 mg total) by mouth daily. 30 tablet 11 08/27/2017 at Unknown time  . ARIPiprazole (ABILIFY)  10 MG tablet Take 1 tablet (10 mg total) daily by mouth. 30 tablet 1 08/27/2017 at Unknown time  . aspirin 81 MG chewable tablet Chew 1 tablet (81 mg total) by mouth daily.   08/27/2017 at Unknown time  . atorvastatin (LIPITOR) 80 MG tablet Take 1 tablet (80 mg total) by mouth daily. (Patient taking differently: Take 80 mg by mouth daily at 6 PM. ) 90 tablet 0 08/26/2017 at Unknown time  . baclofen (LIORESAL) 10 MG tablet Take up to 4/day for muscle spasms (Patient taking differently: Take 10 mg 4 (four) times daily as needed by mouth for muscle spasms (1 tablet in the morning and 1 inth eevenining and 2 at bedtime). ) 120 each 11 08/27/2017 at Unknown time  . clonazePAM (KLONOPIN) 1 MG tablet 1/2 to 1 pill qHS (Patient taking differently: Take 0.5 mg at bedtime by mouth. ) 30 tablet 5 08/26/2017 at Unknown time  . fluticasone (FLONASE) 50 MCG/ACT nasal spray SHAKE LIQUID AND USE 2 SPRAYS IN EACH NOSTRIL DAILY (Patient taking differently: SHAKE LIQUID AND USE 2 SPRAYS IN EACH NOSTRIL DAILY as need rhinitis) 16 g 5 08/27/2017 at Unknown time  . folic acid (FOLVITE) 1 MG tablet Take 1 mg by mouth daily.   08/27/2017 at Unknown time  . furosemide (LASIX) 20 MG tablet TAKE 1 TABLET(20 MG) BY MOUTH DAILY AS NEEDED (Patient taking differently: TAKE 1 TABLET(20 MG) BY MOUTH DAILY AS NEEDED FOR FLUID RETENTION) 10 tablet 0 Past Week at prn  . gabapentin (NEURONTIN) 300 MG capsule Take one pill in the morning, two in the afternoon and two po at bedtime (Patient taking differently: Take 300-600 mg See admin instructions by mouth. Take 300 mg capsule every morning then take 600 mg capsules in the afternoon and at 600 mg at bedtime) 150 capsule 11 08/27/2017 at Unknown time  . hyaluronate sodium (RADIAPLEXRX) GEL Apply 1 application topically 2 (two) times daily. Apply to skin area after  rd txs and bedtime, nothing 4 hours prior to rad tx   08/27/2017 at Unknown time  . hydrOXYzine (VISTARIL) 25 MG capsule Take 1-2 capsule at bed time for anxiety and insomnia (Patient taking differently: Take 50 mg at bedtime by mouth. Take 1-2 capsule at bed time for anxiety and insomnia) 60 capsule 1 08/26/2017 at Unknown time  . lisinopril (PRINIVIL,ZESTRIL) 20 MG tablet Take 1 tablet (20 mg total) by mouth 2 (two) times daily. 180 tablet 3 08/27/2017 at Unknown time  . metoprolol tartrate (LOPRESSOR) 50 MG tablet Take 0.5 tablets (25 mg total) by mouth 2 (two) times daily. 90 tablet 3 08/27/2017 at 1000  . nitroGLYCERIN (NITROSTAT) 0.4 MG SL tablet Place 1 tablet (0.4 mg total) under the tongue every 5 (five) minutes as needed for chest pain. 25 tablet 1 unknown at prn  . non-metallic deodorant Jethro Poling) MISC Apply 1 application topically.   08/27/2017 at Unknown time  . Omega-3 Fatty Acids (OMEGA 3 PO) Take 2 capsules by mouth daily.   Taking  . pantoprazole (PROTONIX) 40 MG tablet Take 1 tablet (40 mg total) by mouth 2 (two) times daily. 30 tablet 6 08/27/2017 at Unknown time  . Probiotic Product (PROBIOTIC PO) Take 1 tablet by mouth daily.   Past Month at Unknown time  . tamoxifen (NOLVADEX) 20 MG tablet Take 1 tablet (20 mg total) by mouth daily. 90 tablet 3 08/27/2017 at Unknown time  . traMADol (ULTRAM) 50 MG tablet Take 1 tablet (50 mg total) by mouth 4 (  four) times daily as needed. (Patient taking differently: Take 50-100 mg by mouth every 12 (twelve) hours as needed for moderate pain or severe pain (depends on pain level if takes 1-2 tablets). ) 120 tablet 4 08/25/2017 at prn    Musculoskeletal: Strength & Muscle Tone: within normal limits Gait & Station: normal Patient leans: N/A  Psychiatric Specialty Exam: Physical Exam  Constitutional: She appears well-developed and well-nourished.  Respiratory: Effort normal.  Neurological: She is alert.  Psychiatric:  As above     ROS   Blood pressure (!) 155/88, pulse 78, temperature 97.9 F (36.6 C), temperature source Oral, resp. rate 16, height '5\' 1"'  (1.549 m), weight 61.2 kg (135 lb), last menstrual period 08/02/2017, SpO2 100 %.Body mass index is 25.51 kg/m.  General Appearance: Casually dressed. Was on the phone with her mom just prior to interview. Good relatedness. Appropriate behavior.   Eye Contact:  Good  Speech:  Clear and Coherent and Normal Rate  Volume:  Normal  Mood:  Feels less depressed.   Affect:  Appropriate and Restricted  Thought Process:  Linear  Orientation:  Full (Time, Place, and Person)  Thought Content:  Future oriented. No thoughts of violence. No hallucination in any modality.   Suicidal Thoughts:  None lately  Homicidal Thoughts:  No  Memory:  Immediate;   Good Recent;   Good Remote;   Good  Judgement:  Good  Insight:  Good  Psychomotor Activity:  Normal  Concentration:  Concentration: Good and Attention Span: Good  Recall:  Good  Fund of Knowledge:  Good  Language:  Good  Akathisia:  Negative  Handed:    AIMS (if indicated):     Assets:  Communication Skills Desire for Improvement Financial Resources/Insurance Housing Resilience Social Support Transportation Vocational/Educational  ADL's:  Intact  Cognition:  WNL  Sleep:  Number of Hours: 6.5    Treatment Plan Summary: Depression and anxiety precipitated by multiple medical issues. Patient has good support from her mother. She is motivated to get better. We discussed use of Sertraline. She consented to treatment after we reviewed the risks and benefits.    Psychiatric: MDD Anxiety disorder Adjustment disorder  Medical: CA breast CAD HTN HLD CKD  Psychosocial:  Multiple medical issues Stressful job position.   PLAN: 1. Sertraline 25 mg daily 2. Continue Abilify at current dose 3. Continue medical medications at home dose.  4. Monitor mood, behavior and interaction with peers  5. Encourage unit groups  and activities 6. SW would gather collateral from her family   Observation Level/Precautions:  15 minute checks  Laboratory:    Psychotherapy:    Medications:    Consultations:    Discharge Concerns:    Estimated LOS:  Other:     Physician Treatment Plan for Primary Diagnosis: <principal problem not specified> Long Term Goal(s): Improvement in symptoms so as ready for discharge  Short Term Goals: Ability to identify changes in lifestyle to reduce recurrence of condition will improve, Ability to verbalize feelings will improve, Ability to disclose and discuss suicidal ideas, Ability to demonstrate self-control will improve, Ability to identify and develop effective coping behaviors will improve, Ability to maintain clinical measurements within normal limits will improve and Compliance with prescribed medications will improve  Physician Treatment Plan for Secondary Diagnosis: Active Problems:   MDD (major depressive disorder), recurrent episode, severe ([place])  Long Term Goal(s): Improvement in symptoms so as ready for discharge  Short Term Goals: Ability to identify changes in lifestyle to reduce  recurrence of condition will improve, Ability to verbalize feelings will improve, Ability to disclose and discuss suicidal ideas, Ability to demonstrate self-control will improve, Ability to identify and develop effective coping behaviors will improve, Ability to maintain clinical measurements within normal limits will improve and Compliance with prescribed medications will improve  I certify that inpatient services furnished can reasonably be expected to improve the patient's condition.    Artist Beach, MD 11/21/20181:17 PM

## 2017-08-29 NOTE — Progress Notes (Signed)
NUTRITION ASSESSMENT  Pt identified as at risk on the Malnutrition Screen Tool  INTERVENTION: 1. Supplements: Ensure Enlive po BID PRN, each supplement provides 350 kcal and 20 grams of protein  NUTRITION DIAGNOSIS: Unintentional weight loss related to sub-optimal intake as evidenced by pt report.   Goal: Pt to meet >/= 90% of their estimated nutrition needs.  Monitor:  PO intake  Assessment:  Pt admitted with depression. Currently undergoing radiation therapy for breast cancer. Pt with increased needs. Pt's weight fluctuates between 135-144 lb. Pt would benefit from nutritional supplementation if not eating > 50% of meals.   Height: Ht Readings from Last 1 Encounters:  08/28/17 5\' 1"  (1.549 m)    Weight: Wt Readings from Last 1 Encounters:  08/28/17 135 lb (61.2 kg)    Weight Hx: Wt Readings from Last 10 Encounters:  08/28/17 135 lb (61.2 kg)  08/22/17 136 lb 8 oz (61.9 kg)  08/16/17 140 lb (63.5 kg)  08/03/17 137 lb (62.1 kg)  07/25/17 137 lb 3.2 oz (62.2 kg)  06/28/17 144 lb 12.8 oz (65.7 kg)  06/21/17 137 lb (62.1 kg)  06/19/17 137 lb 2 oz (62.2 kg)  05/31/17 135 lb (61.2 kg)  05/24/17 142 lb 9.6 oz (64.7 kg)    BMI:  Body mass index is 25.51 kg/m. Pt meets criteria for normal based on current BMI.  Estimated Nutritional Needs: Kcal: 25-30 kcal/kg Protein: > 1 gram protein/kg Fluid: 1 ml/kcal  Diet Order: Diet regular Room service appropriate? Yes; Fluid consistency: Thin Pt is also offered choice of unit snacks mid-morning and mid-afternoon.  Pt is eating as desired.   Lab results and medications reviewed.   Clayton Bibles, MS, RD, [place] Dietitian Pager: [phone] After Hours Pager: [phone]

## 2017-08-29 NOTE — Plan of Care (Signed)
  Medication: Compliance with prescribed medication regimen will improve 08/29/2017 1857 - Progressing by Rockne Coons, RN

## 2017-08-29 NOTE — Telephone Encounter (Signed)
REFILL 

## 2017-08-30 DIAGNOSIS — G4700 Insomnia, unspecified: Secondary | ICD-10-CM

## 2017-08-30 DIAGNOSIS — Z87891 Personal history of nicotine dependence: Secondary | ICD-10-CM

## 2017-08-30 DIAGNOSIS — F39 Unspecified mood [affective] disorder: Secondary | ICD-10-CM

## 2017-08-30 MED ORDER — SERTRALINE HCL 50 MG PO TABS
50.0000 mg | ORAL_TABLET | Freq: Every day | ORAL | Status: DC
  Administered 2017-08-31: 50 mg via ORAL
  Filled 2017-08-30 (×3): qty 1

## 2017-08-30 NOTE — Progress Notes (Signed)
[facility] MD Progress Note  08/30/2017 10:25 AM Anna Henson  MRN:  [id number]   Subjective:  Patient reports that she is feeling pretty good today. She is focused on going home and states that she already has appointments with Dr. Adele Schilder arranged for next week. She plans to stay with her mother after discharge , which is here in [place]. She denies any SI/HI/AVH and contracts for safety.  Objective: Patient's chart and findings reviewed and discussed with treatment team. Due to patient just starting Zoloft and wanting to increase it to 50 mg Daily, patient will stay at least until tomorrow. Will increase Zoloft to 50 mg Daily. She became overwhelmed with her medical issues but seems to be minimizing the reason she needs to stay due to the holiday.  Principal Problem: MDD (major depressive disorder), recurrent episode, severe ([place]) Diagnosis:   Patient Active Problem List   Diagnosis Date Noted  . MDD (major depressive disorder), recurrent episode, severe ([place]) [F33.2] 08/28/2017  . Neck stiffness [M43.6] 08/22/2017  . Abnormal finding on MRI of brain [R90.89] 08/22/2017  . HOCM (hypertrophic obstructive cardiomyopathy) ([place]) [I42.1]   . Chest pain [R07.9] 05/06/2017  . Genetic testing [Z13.79] 04/23/2017  . Malignant neoplasm of lower-outer quadrant of right breast of female, estrogen receptor positive ([place]) [C50.511, Z17.0] 03/29/2017  . Dysesthesia [R20.8] 03/12/2017  . Palpitations [R00.2] 02/23/2017  . S/P cardiac cath (12/2016) a. acute total occlusion of the RCA tx w/PCI DES, b. mild nonobs LAD/LCx stenosis, c. mild segmental LV sys dx w/ sev hypokinesis, LVEF 50-55% [Z98.890] 01/02/2017  . Non-ST elevation myocardial infarction (NSTEMI), subsequent episode of care ([place]) [I21.4] 12/31/2016  . 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 Chronic kidney disease [N18.9]   . ST elevation myocardial infarction involving right coronary artery ([place]) [I21.11]   . Abnormal EKG [R94.31]   . Nausea [R11.0]    . Ischemic cardiomyopathy [I25.5]   . Muscle spasms of both lower extremities [M62.838] 04/17/2016  . Gait disturbance [R26.9] 01/25/2016  . 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 Acute-on-chronic kidney injury ([place]) [N17.9, N18.9] 11/12/2015  . Neck pain [M54.2] 11/12/2015  . Hypokalemia [E87.6] 11/12/2015  . Abnormal MRI, neck [R93.89] 11/12/2015  . Numbness [R20.0] 11/12/2015  . Hypertension [I10]   . Hyperlipidemia [E78.5]   . Coronary artery disease involving native coronary artery of native heart without angina pectoris [I25.10]   . Facial numbness [R20.0]   . Right arm numbness [R20.2]   . Throat pain [R07.0] 11/09/2015  . Drooling [K11.7] 11/09/2015  . CAD (coronary artery disease), native coronary artery [I25.10] 09/15/2014  . Headache [R51] 09/15/2014  . UTI (urinary tract infection): Probable [N39.0] 09/14/2014  . Candida infection of genital region [B37.49] 09/14/2014  . Unstable angina ([place]) [I20.0] 09/13/2014  . HLD (hyperlipidemia) [E78.5] 09/13/2014  . Otitis [H66.90] 09/13/2014  . ACS (acute coronary syndrome) ([place]) [I24.9] 09/13/2014  . Tobacco abuse [Z72.0] 09/13/2014  . Essential hypertension [I10] 09/09/2014   Total Time spent with patient: 25 minutes  Past Psychiatric History: See H&P  Past Medical History:  Past Medical History:  Diagnosis Date  . Anxiety   . Bipolar disorder ([place])   . Breast cancer, right breast ([place]) ~ 03/2017  . CAD in native artery    a. 09/2014 Cath/PCI in setting of [place] - s/p 2.25 x 12 mm Promus Premier DES to the RCA;  b. 11/2016 Myoview: EF 56%, no ischemia;  c. 12/2016 NSTEMI/PCI: LM nl, LAd 25p, LCX 30p, RCA 100p (2.75x38 Promus  Premier DES), mRCA 10 ISR, EF 50-55%.  . Chronic kidney disease    she sthinks has stage 2 CKD, has had US doppler and has some stenosis , this was done in [place]   . CKD (chronic kidney disease), stage II   . Depression   . Dysrhythmia    Tachycardia in evenings  . Genetic testing  04/23/2017   Ms. Sweetser underwent genetic counseling and testing for hereditary cancer syndromes on 04/05/2017. Her results were negative for mutations in all 46 genes analyzed by [facility]'s 46-gene Common Hereditary Cancers Panel. Genes analyzed include: APC, ATM, AXIN2, BARD1, BMPR1A, BRCA1, BRCA2, BRIP1, CDH1, CDKN2A, CHEK2, CTNNA1, DICER1, EPCAM, GREM1, HOXB13, KIT, MEN1, MLH1, MSH2, MSH3, MSH6, MUTYH, NBN,  . GERD (gastroesophageal reflux disease)   . Headache    "bi-weekly" (05/07/2017)  . Heart attack ([place]) 12/31/2016  . HOCM (hypertrophic obstructive cardiomyopathy) ([place])    a. 12/2016 Echo: EF 65-70%,  mod conc LVH, dynamic obstruction @ rest, peak velocity of 291 cm/sec w/ peak gradient of 31mHg, no rwma, Gr1 DD, triv TR, PASP 160mg.  . Marland Kitchenyperlipidemia   . Hypertension    > 5 years  . Migraine    "probably 1/month" (05/07/2017)  . Palpitations   . Pneumonia    "twice when I was a child" (05/07/2017)  . Tobacco abuse 09/13/2014  . Transverse myelitis ([facility]    Past Surgical History:  Procedure Laterality Date  . ABDOMINAL HERNIA REPAIR  ~ 2001; ~ 2005  . BREAST BIOPSY Right ~ 03/2017 X 2  . BREAST LUMPECTOMY WITH RADIOACTIVE SEED AND SENTINEL LYMPH NODE BIOPSY Right 06/21/2017   Procedure: RIGHT BREAST BRACKETED SEED GUIDED LUMPECTOMY AND SENTINEL LYMPH NODE BIOPSY;  Surgeon: ToJovita KussmaulMD;  [facility] Service: General;  Laterality: Right;  . CORONARY ANGIOPLASTY WITH STENT PLACEMENT     L main OK, LAD OK, CFX 30%, RCA 90%-0% w/ 2.25 x 12 mm Promus Premier DES, EF 55%  . CORONARY/GRAFT ACUTE MI REVASCULARIZATION N/A 12/31/2016   Procedure: Coronary/Graft Acute MI Revascularization;  Surgeon: MiSherren MochaMD;  [facility];  Service: Cardiovascular;  Laterality: N/A;  . HERNIA REPAIR     times 2  umbilical  . LEFT HEART CATH AND CORONARY ANGIOGRAPHY N/A 12/31/2016   Procedure: Left Heart Cath and Coronary Angiography;  Surgeon: MiSherren MochaMD;   [facility];  Service: Cardiovascular;  Laterality: N/A;  . LEFT HEART CATHETERIZATION WITH CORONARY ANGIOGRAM N/A 09/14/2014   Procedure: LEFT HEART CATHETERIZATION WITH CORONARY ANGIOGRAM;  Surgeon: ChBurnell BlanksMD;  [facility];  Service: Cardiovascular;  Laterality: N/A;  . PERCUTANEOUS CORONARY STENT INTERVENTION (PCI-S)  09/14/2014   Procedure: PERCUTANEOUS CORONARY STENT INTERVENTION (PCI-S);  Surgeon: ChBurnell BlanksMD;  [facility];  Service: Cardiovascular;;  . TONSILLECTOMY  2005   Family History:  Family History  Problem Relation Age of Onset  . Diabetes Mother   . Heart disease Mother   . Hyperlipidemia Mother   . Hypertension Mother   . Lung cancer Father   . Drug abuse Father   . Brain cancer Father 7316. Bone cancer Father 7339. Drug abuse Sister   . Mental illness Brother   . Mental illness Maternal Grandmother   . Stomach cancer Paternal Grandmother [id number]     d.75   Family Psychiatric  History: See H&P Social History:  Social History   Substance  and Sexual Activity  Alcohol Use Yes  . Alcohol/week: 1.2 oz  . Types: 2 Cans of beer per week     Social History   Substance and Sexual Activity  Drug Use No    Social History   Socioeconomic History  . Marital status: Single    Spouse name: None  . Number of children: 0  . Years of education: masters  . Highest education level: None  Social Needs  . Financial resource strain: Somewhat hard  . Food insecurity - worry: Often true  . Food insecurity - inability: Often true  . Transportation needs - medical: No  . Transportation needs - non-medical: No  Occupational History    Comment: [employer]  Tobacco Use  . Smoking status: Former Smoker    Packs/day: 0.50    Years: 30.00    Pack years: 15.00    Types: Cigarettes    Last attempt to quit: 10/05/2015    Years since quitting: 1.9  . Smokeless tobacco: Never Used  Substance and Sexual Activity   . Alcohol use: Yes    Alcohol/week: 1.2 oz    Types: 2 Cans of beer per week  . Drug use: No  . Sexual activity: Not Currently  Other Topics Concern  . None  Social History Narrative   Consumes 2 cups of caffeine daily   Additional Social History:    Pain Medications: see MAR Prescriptions: see MAR Over the Counter: see MAR History of alcohol / drug use?: Yes Longest period of sobriety (when/how long): Pt. began drinking at 14. Pt. reports that she has not had a 30 day period of sobriety since that time. Pt. currently drinking 4-5 shots 3X a week Name of Substance 1: alcohol  1 - Age of First Use: UTA 1 - Amount (size/oz): UTA 1 - Frequency: UTA 1 - Duration: UTA 1 - Last Use / Amount: a drink at dinner about a week ago - was drinking hard liquor- 3 to4 times a week, 4 to 5 drinks  Name of Substance 2: cannabis  2 - Age of First Use: uTA 2 - Amount (size/oz): UTA 2 - Frequency: UTA 2 - Duration: UTA 2 - Last Use / Amount: UTA                Sleep: Good  Appetite:  Good  Current Medications: Current Facility-Administered Medications  Medication Dose Route Frequency Provider Last Rate Last Dose  . acetaminophen (TYLENOL) tablet 650 mg  650 mg Oral Q6H PRN Patriciaann Clan E, PA-C      . amLODipine (NORVASC) tablet 5 mg  5 mg Oral Daily Derrill Center, NP   5 mg at 08/30/17 5364  . ARIPiprazole (ABILIFY) tablet 10 mg  10 mg Oral Daily Derrill Center, NP   10 mg at 08/30/17 0811  . aspirin chewable tablet 81 mg  81 mg Oral Daily Derrill Center, NP   81 mg at 08/30/17 6803  . atorvastatin (LIPITOR) tablet 80 mg  80 mg Oral q1800 Derrill Center, NP   80 mg at 08/29/17 1713  . baclofen (LIORESAL) tablet 10 mg  10 mg Oral QID PRN Artist Beach, MD   10 mg at 08/30/17 0813  . feeding supplement (ENSURE ENLIVE) (ENSURE ENLIVE) liquid 237 mL  237 mL Oral BID PRN Cobos, Myer Peer, MD      . folic acid (FOLVITE) tablet 1 mg  1 mg Oral Daily Izediuno, Laruth Bouchard, MD  1  mg at 08/30/17 0811  . furosemide (LASIX) tablet 20 mg  20 mg Oral Daily Derrill Center, NP   20 mg at 08/30/17 0811  . gabapentin (NEURONTIN) capsule 300 mg  300 mg Oral BID Derrill Center, NP   300 mg at 08/30/17 3545  . hydrOXYzine (ATARAX/VISTARIL) tablet 25 mg  25 mg Oral Q6H PRN Laverle Hobby, PA-C      . lisinopril (PRINIVIL,ZESTRIL) tablet 20 mg  20 mg Oral BID Artist Beach, MD   20 mg at 08/30/17 6256  . magnesium hydroxide (MILK OF MAGNESIA) suspension 30 mL  30 mL Oral Daily PRN Patriciaann Clan E, PA-C      . nitroGLYCERIN (NITROSTAT) SL tablet 0.4 mg  0.4 mg Sublingual Q5 min PRN Derrill Center, NP      . omega-3 acid ethyl esters (LOVAZA) capsule 2 g  2 g Oral BID Derrill Center, NP      . prasugrel (EFFIENT) tablet 10 mg  10 mg Oral Daily Izediuno, Laruth Bouchard, MD   10 mg at 08/30/17 0811  . [START ON 08/31/2017] sertraline (ZOLOFT) tablet 50 mg  50 mg Oral Daily Olita Takeshita B, FNP      . tamoxifen (NOLVADEX) tablet 20 mg  20 mg Oral Daily Derrill Center, NP   20 mg at 08/30/17 0814  . traZODone (DESYREL) tablet 50 mg  50 mg Oral QHS,MR X 1 Laverle Hobby, PA-C   50 mg at 08/29/17 2106   Facility-Administered Medications Ordered in Other Encounters  Medication Dose Route Frequency Provider Last Rate Last Dose  . gadopentetate dimeglumine (MAGNEVIST) injection 16 mL  16 mL Intravenous Once PRN Sater, Nanine Means, MD        Lab Results: No results found for this or any previous visit (from the past 48 hour(s)).  Blood Alcohol level:  Lab Results  Component Value Date   ETH <10 38/93/7342    Metabolic Disorder Labs: Lab Results  Component Value Date   HGBA1C 5.8 02/22/2016   No results found for: PROLACTIN Lab Results  Component Value Date   CHOL 120 06/20/2017   TRIG 70 06/20/2017   HDL 37 (L) 06/20/2017   CHOLHDL 3.2 06/20/2017   VLDL 20 12/31/2016   LDLCALC 69 06/20/2017   LDLCALC 83 12/31/2016    Physical Findings: AIMS: Facial and Oral  Movements Muscles of Facial Expression: None, normal Lips and Perioral Area: None, normal Jaw: None, normal Tongue: None, normal,Extremity Movements Upper (arms, wrists, hands, fingers): None, normal Lower (legs, knees, ankles, toes): None, normal, Trunk Movements Neck, shoulders, hips: None, normal, Overall Severity Severity of abnormal movements (highest score from questions above): None, normal Incapacitation due to abnormal movements: None, normal Patient's awareness of abnormal movements (rate only patient's report): No Awareness, Dental Status Current problems with teeth and/or dentures?: No Does patient usually wear dentures?: No  CIWA:  CIWA-Ar Total: 3 COWS:  COWS Total Score: 4  Musculoskeletal: Strength & Muscle Tone: within normal limits Gait & Station: normal Patient leans: N/A  Psychiatric Specialty Exam: Physical Exam  Nursing note and vitals reviewed. Constitutional: She is oriented to person, place, and time. She appears well-developed and well-nourished.  Cardiovascular: Normal rate.  Respiratory: Effort normal.  Musculoskeletal: Normal range of motion.  Neurological: She is alert and oriented to person, place, and time.  Skin: Skin is warm.    Review of Systems  Constitutional: Negative.   HENT: Negative.   Eyes: Negative.  Respiratory: Negative.   Cardiovascular: Negative.   Gastrointestinal: Negative.   Genitourinary: Negative.   Musculoskeletal: Negative.   Skin: Negative.   Neurological: Negative.   Endo/Heme/Allergies: Negative.     Blood pressure (!) 176/92, pulse 76, temperature 98.5 F (36.9 C), temperature source Oral, resp. rate 16, height _0  (1.549 m), weight 61.2 kg (135 lb), last menstrual period 08/02/2017, SpO2 100 %.Body mass index is 25.51 kg/m.  General Appearance: Fairly Groomed  Eye Contact:  Good  Speech:  Clear and Coherent and Normal Rate  Volume:  Normal  Mood:  Euthymic  Affect:  Appropriate  Thought Process:   Coherent, Goal Directed and Descriptions of Associations: Intact  Orientation:  Full (Time, Place, and Person)  Thought Content:  WDL  Suicidal Thoughts:  No  Homicidal Thoughts:  No  Memory:  Immediate;   Good Recent;   Good Remote;   Good  Judgement:  Good  Insight:  Good  Psychomotor Activity:  Normal  Concentration:  Concentration: Good and Attention Span: Good  Recall:  Good  Fund of Knowledge:  Good  Language:  Good  Akathisia:  No  Handed:  Right  AIMS (if indicated):     Assets:  Communication Skills Desire for Improvement Financial Resources/Insurance Housing Physical Health Social Support  ADL's:  Intact  Cognition:  WNL  Sleep:  Number of Hours: 6.5   Problems Addressed: MDD severe  Treatment Plan Summary: Daily contact with patient to assess and evaluate symptoms and progress in treatment, Medication management and Plan is to:  -Continue Abilify 10 mg PO Daily for mood stability -Increase Zoloft 50 mg PO Daily for mood stability -Continue Neurontin 300 mg PO BID for pain -Continue Trazodone 50 mg PO QHS PRN for insomnia -Continue Vistaril 25 mg Q6H PRN for anxiety -Encourage group therapy participation  Lewis Shock, FNP 08/30/2017, 10:25 AM

## 2017-08-30 NOTE — Plan of Care (Signed)
Pt denies SI at this time, pt stated he anxiety and overall feelings are better

## 2017-08-30 NOTE — Progress Notes (Signed)
Pt attended evening wrap up group and stated that today was a 9 for her she was able to speak to her estranged sister today and spoke with her brother and niece during visitation! Overall she states that today was better than she expected being in the hospital for the holidays!

## 2017-08-30 NOTE — Progress Notes (Signed)
Patient ID: Anna Henson, female   DOB: 09-Aug-1971, 46 y.o.   MRN: [id number]   D: Patient pleasant and smiling on approach tonight. She reports her mood is better than on admission. Currently denies any active SI and contracts for safety on the unit. Denies any current issues. A: Staff will continue to monitor on q 15 minute checks, follow-treatment plan, and give medications as prescribed.  R: Cooperative on the unit.

## 2017-08-30 NOTE — Progress Notes (Signed)
Patient's family visited her this afternoon.  Patient stated she would like MD/SW to call her mother, Meryle Ready phone [phone], about her medications and progress.  Patient stated her family also takes medications for mental health problems.

## 2017-08-30 NOTE — Progress Notes (Signed)
D: Pt denies SI/HI/AVH. Pt is pleasant and cooperative. Pt stated she was feeling much better due to looking at her situation a different way, not feel down like she was when she came in to the hospital.  A: Pt was offered support and encouragement. Pt was given scheduled medications. Pt was encourage to attend groups. Q 15 minute checks were done for safety.   R:Pt attends groups and interacts well with peers and staff. Pt is taking medication. Pt has no complaints.Pt receptive to treatment and safety maintained on unit.

## 2017-08-30 NOTE — Progress Notes (Signed)
Patient ID: Anna Henson, female   DOB: 1971/08/17, 46 y.o.   MRN: [id number]  DAR: Pt. Denies SI/HI and A/V Hallucinations. She reports that her sleep last night was good, her appetite is good, her energy level is normal, and concentration is good. She rates her depression, hopelessness, and anxiety level 0/10. Patient does not report any pain or discomfort at this time. Support and encouragement provided to the patient. Scheduled medications administered to patient per physician's orders. Patient's BP remains elevated, patient is asymptomatic at this time. Patient is minimal with Probation officer but cooperative. This afternoon patient's family visited. Patient is sad but pleasant in mood. Q15 minute checks are maintained for safety.

## 2017-08-30 NOTE — Progress Notes (Signed)
Report received. Patient observed in bed asleep. Respirations even and non labored. No distress noted. Monitoring of patient continues. Patient remains safe on unit.

## 2017-08-31 MED ORDER — HYDROXYZINE HCL 25 MG PO TABS: 25.0000 mg | ORAL_TABLET | Freq: Four times a day (QID) | ORAL | 0 refills | Status: DC | PRN

## 2017-08-31 MED ORDER — SERTRALINE HCL 50 MG PO TABS: 50.0000 mg | ORAL_TABLET | Freq: Every day | ORAL | 0 refills | Status: DC

## 2017-08-31 MED ORDER — PRASUGREL HCL 10 MG PO TABS: 10.0000 mg | ORAL_TABLET | Freq: Every day | ORAL | 0 refills | Status: DC

## 2017-08-31 MED ORDER — GABAPENTIN 300 MG PO CAPS: 300.0000 mg | ORAL_CAPSULE | Freq: Two times a day (BID) | ORAL | 0 refills | Status: DC

## 2017-08-31 MED ORDER — ARIPIPRAZOLE 10 MG PO TABS: 10.0000 mg | ORAL_TABLET | Freq: Every day | ORAL | 0 refills | Status: DC

## 2017-08-31 MED ORDER — TRAZODONE HCL 50 MG PO TABS: 50.0000 mg | ORAL_TABLET | Freq: Every evening | ORAL | 0 refills | Status: DC | PRN

## 2017-08-31 NOTE — Discharge Summary (Signed)
Physician Discharge Summary Note  Patient:  Anna Henson is an 46 y.o., female MRN:  [id number] DOB:  09/13/1971 Patient phone:  [phone] (home)  Patient address:   [address] [place] [address],  Total Time spent with patient: 20 minutes  Date of Admission:  08/28/2017 Date of Discharge: 08/31/17   Reason for Admission:  Worsening depression with SI  Principal Problem: MDD (major depressive disorder), recurrent episode, severe [facility]) Discharge Diagnoses: Patient Active Problem List   Diagnosis Date Noted  . MDD (major depressive disorder), recurrent episode, severe ([place]) [F33.2] 08/28/2017  . Neck stiffness [M43.6] 08/22/2017  . Abnormal finding on MRI of brain [R90.89] 08/22/2017  . HOCM (hypertrophic obstructive cardiomyopathy) ([place]) [I42.1]   . Chest pain [R07.9] 05/06/2017  . Genetic testing [Z13.79] 04/23/2017  . Malignant neoplasm of lower-outer quadrant of right breast of female, estrogen receptor positive ([place]) [C50.511, Z17.0] 03/29/2017  . Dysesthesia [R20.8] 03/12/2017  . Palpitations [R00.2] 02/23/2017  . S/P cardiac cath (12/2016) a. acute total occlusion of the RCA tx w/PCI DES, b. mild nonobs LAD/LCx stenosis, c. mild segmental LV sys dx w/ sev hypokinesis, LVEF 50-55% [Z98.890] 01/02/2017  . Non-ST elevation myocardial infarction (NSTEMI), subsequent episode of care ([place]) [I21.4] 12/31/2016  . 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 Chronic kidney disease [N18.9]   . ST elevation myocardial infarction involving right coronary artery ([place]) [I21.11]   . Abnormal EKG [R94.31]   . Nausea [R11.0]   . Ischemic cardiomyopathy [I25.5]   . Muscle spasms of both lower extremities [M62.838] 04/17/2016  . Gait disturbance [R26.9] 01/25/2016  . 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 Acute-on-chronic kidney injury ([place]) [N17.9, N18.9] 11/12/2015  . Neck pain [M54.2] 11/12/2015  . Hypokalemia [E87.6] 11/12/2015  . Abnormal MRI, neck [R93.89] 11/12/2015   . Numbness [R20.0] 11/12/2015  . Hypertension [I10]   . Hyperlipidemia [E78.5]   . Coronary artery disease involving native coronary artery of native heart without angina pectoris [I25.10]   . Facial numbness [R20.0]   . Right arm numbness [R20.2]   . Throat pain [R07.0] 11/09/2015  . Drooling [K11.7] 11/09/2015  . CAD (coronary artery disease), native coronary artery [I25.10] 09/15/2014  . Headache [R51] 09/15/2014  . UTI (urinary tract infection): Probable [N39.0] 09/14/2014  . Candida infection of genital region [B37.49] 09/14/2014  . Unstable angina ([place]) [I20.0] 09/13/2014  . HLD (hyperlipidemia) [E78.5] 09/13/2014  . Otitis [H66.90] 09/13/2014  . ACS (acute coronary syndrome) ([place]) [I24.9] 09/13/2014  . Tobacco abuse [Z72.0] 09/13/2014  . Essential hypertension [I10] 09/09/2014    Past Psychiatric History: No past history of mental illness. Was not on any psychotropic medication until a couple of weeks ago. Has never been tried on any antidepressant. Single episode of slitting her wrist in her 20's. 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 No past history of violent behavior    Past Medical History:  Past Medical History:  Diagnosis Date  . Anxiety   . Bipolar disorder ([place])   . Breast cancer, right breast ([place]) ~ 03/2017  . CAD in native artery    a. 09/2014 Cath/PCI in setting of [place] - s/p 2.25 x 12 mm Promus Premier DES to the RCA;  b. 11/2016 Myoview: EF 56%, no ischemia;  c. 12/2016 NSTEMI/PCI: LM nl, LAd 25p, LCX 30p, RCA 100p (2.75x38 Promus Premier DES), mRCA 10 ISR, EF 50-55%.  . Chronic kidney disease    she sthinks has stage 2 CKD, has  had US doppler and has some stenosis , this was done in [place]   . CKD (chronic kidney disease), stage II   . Depression   . Dysrhythmia    Tachycardia in evenings  . Genetic testing 04/23/2017   Ms. Caswell underwent genetic counseling and testing for hereditary cancer syndromes on 04/05/2017. Her  results were negative for mutations in all 46 genes analyzed by [employer]'s 46-gene Common Hereditary Cancers Panel. Genes analyzed include: APC, ATM, AXIN2, BARD1, BMPR1A, BRCA1, BRCA2, BRIP1, CDH1, CDKN2A, CHEK2, CTNNA1, DICER1, EPCAM, GREM1, HOXB13, KIT, MEN1, MLH1, MSH2, MSH3, MSH6, MUTYH, NBN,  . GERD (gastroesophageal reflux disease)   . Headache    "bi-weekly" (05/07/2017)  . Heart attack ([place]) 12/31/2016  . HOCM (hypertrophic obstructive cardiomyopathy) ([place])    a. 12/2016 Echo: EF 65-70%,  mod conc LVH, dynamic obstruction @ rest, peak velocity of 291 cm/sec w/ peak gradient of 19mHg, no rwma, Gr1 DD, triv TR, PASP 161mg.  . Marland Kitchenyperlipidemia   . Hypertension    > 5 years  . Migraine    "probably 1/month" (05/07/2017)  . Palpitations   . Pneumonia    "twice when I was a child" (05/07/2017)  . Tobacco abuse 09/13/2014  . Transverse myelitis ([place]    Past Surgical History:  Procedure Laterality Date  . ABDOMINAL HERNIA REPAIR  ~ 2001; ~ 2005  . BREAST BIOPSY Right ~ 03/2017 X 2  . BREAST LUMPECTOMY WITH RADIOACTIVE SEED AND SENTINEL LYMPH NODE BIOPSY Right 06/21/2017   Procedure: RIGHT BREAST BRACKETED SEED GUIDED LUMPECTOMY AND SENTINEL LYMPH NODE BIOPSY;  Surgeon: ToJovita KussmaulMD;  [facility] Service: General;  Laterality: Right;  . CORONARY ANGIOPLASTY WITH STENT PLACEMENT     L main OK, LAD OK, CFX 30%, RCA 90%-0% w/ 2.25 x 12 mm Promus Premier DES, EF 55%  . CORONARY/GRAFT ACUTE MI REVASCULARIZATION N/A 12/31/2016   Procedure: Coronary/Graft Acute MI Revascularization;  Surgeon: MiSherren MochaMD;  [facility];  Service: Cardiovascular;  Laterality: N/A;  . HERNIA REPAIR     times 2  umbilical  . LEFT HEART CATH AND CORONARY ANGIOGRAPHY N/A 12/31/2016   Procedure: Left Heart Cath and Coronary Angiography;  Surgeon: MiSherren MochaMD;  [facility];  Service: Cardiovascular;  Laterality: N/A;  . LEFT HEART CATHETERIZATION WITH CORONARY  ANGIOGRAM N/A 09/14/2014   Procedure: LEFT HEART CATHETERIZATION WITH CORONARY ANGIOGRAM;  Surgeon: ChBurnell BlanksMD;  [facility];  Service: Cardiovascular;  Laterality: N/A;  . PERCUTANEOUS CORONARY STENT INTERVENTION (PCI-S)  09/14/2014   Procedure: PERCUTANEOUS CORONARY STENT INTERVENTION (PCI-S);  Surgeon: ChBurnell BlanksMD;  [facility];  Service: Cardiovascular;;  . TONSILLECTOMY  2005   Family History:  Family History  Problem Relation Age of Onset  . Diabetes Mother   . Heart disease Mother   . Hyperlipidemia Mother   . Hypertension Mother   . Lung cancer Father   . Drug abuse Father   . Brain cancer Father [id number]. Bone cancer Father [id number]. Drug abuse Sister   . Mental illness Brother   . Mental illness Maternal Grandmother   . Stomach cancer Paternal Grandmother [id number]     d.75   Family Psychiatric  History: undefined mental illness in family Social History:  Social History   Substance and Sexual Activity  Alcohol Use Yes  . Alcohol/week: 1.2 oz  . Types: 2 Cans of beer  per week     Social History   Substance and Sexual Activity  Drug Use No    Social History   Socioeconomic History  . Marital status: Single    Spouse name: None  . Number of children: 0  . Years of education: masters  . Highest education level: None  Social Needs  . Financial resource strain: Somewhat hard  . Food insecurity - worry: Often true  . Food insecurity - inability: Often true  . Transportation needs - medical: No  . Transportation needs - non-medical: No  Occupational History    Comment: [employer]  Tobacco Use  . Smoking status: Former Smoker    Packs/day: 0.50    Years: 30.00    Pack years: 15.00    Types: Cigarettes    Last attempt to quit: 10/05/2015    Years since quitting: 1.9  . Smokeless tobacco: Never Used  Substance and Sexual Activity  . Alcohol use: Yes    Alcohol/week: 1.2 oz    Types: 2 Cans of beer per week  . Drug  use: No  . Sexual activity: Not Currently  Other Topics Concern  . None  Social History Narrative   Consumes 2 cups of caffeine daily    Hospital Course:   08/29/17 [facility] MD Assessment: 46 y.o AAF female, single, lives alone. Employed. Background history of Unipolar depression and multiple medical comorbidity. Presented to the ER in company of her mom . Reports increasing depression and anxiety. She has been feeling more hopeless. She was recently started on Abilify and felt worse on the medication. She had expressed thoughts to hang herself or stab herself. Routine labs is significant for chronic renal impairment, anemia and mildly elevated WBC.  UDS is positive for THC. No alcohol.  At interview, patient reports that she had been doing okay mentally until she started having medical issues. Says she was initially diagnosed with a spinal problem. She later had a heart attack and subsequently had cardiac surgery. Says she was diagnosed with breast cancer three weeks ago. Patient says she felt overwhelmed and had some bad thoughts. Says her mother encouraged her to seek help. Patient says she has been able to process things well. Says she feels her job is too stressful. 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 Says she has asked her boss to reassign her to a less demanding position with less pay. Patient says she has decided she would resign her position and sign onto disability if her wishes are not respected. Says she cannot imagine putting her mother through the grief of suicide. Says she has no intent to harm herself. Says the thoughts were passive wish that she was no longer alive. Says she feels more powerful now she has a plan on how to make things better. Says actually the Abilify has helped her. Patient reports normal sleep at night and appetite. Says she feels more energetic. Says she is able to think clearly. No difficulties remembering things. She does not have any violent thoughts. No homicidal  thoughts. Patient denies any other stressors.   Patient remained on the [facility] unit for 2 days and stabilized with medication and therapy. Patient was continued on Abilify 10 mg Daily and Zoloft 50 mg Daily. She showed significant improvemnet with therapy due to her stressors at home. Patient also used Gabapentin 300 mg TID, Vistaril 25 mg Q6H PRN, and Trazodone 50 mg QHS PRN for insomnia. Patient showed improvement with improved mood, affect, sleep, appetite,  and interaction. Patient agreed to follow up at [facility]. Patient is provided with prescriptions for her medications upon discharge. Patient denies any SI/HI/AVH and contracts for safety.    Physical Findings: AIMS: Facial and Oral Movements Muscles of Facial Expression: None, normal Lips and Perioral Area: None, normal Jaw: None, normal Tongue: None, normal,Extremity Movements Upper (arms, wrists, hands, fingers): None, normal Lower (legs, knees, ankles, toes): None, normal, Trunk Movements Neck, shoulders, hips: None, normal, Overall Severity Severity of abnormal movements (highest score from questions above): None, normal Incapacitation due to abnormal movements: None, normal Patient's awareness of abnormal movements (rate only patient's report): No Awareness, Dental Status Current problems with teeth and/or dentures?: No Does patient usually wear dentures?: No  CIWA:  CIWA-Ar Total: 3 COWS:  COWS Total Score: 4  Musculoskeletal: Strength & Muscle Tone: within normal limits Gait & Station: normal Patient leans: N/A  Psychiatric Specialty Exam: Physical Exam  Nursing note and vitals reviewed. Constitutional: She is oriented to person, place, and time. She appears well-developed and well-nourished.  Cardiovascular: Normal rate.  Respiratory: Effort normal.  Musculoskeletal: Normal range of motion.  Neurological: She is alert and oriented to person, place, and time.  Skin: Skin is warm.    Review of  Systems  Constitutional: Negative.   HENT: Negative.   Eyes: Negative.   Respiratory: Negative.   Cardiovascular: Negative.   Gastrointestinal: Negative.   Genitourinary: Negative.   Musculoskeletal: Negative.   Skin: Negative.   Neurological: Negative.   Endo/Heme/Allergies: Negative.   Psychiatric/Behavioral: Negative.     Blood pressure (!) 166/89, pulse 91, temperature 97.9 F (36.6 C), temperature source Oral, resp. rate 18, height _0  (1.549 m), weight 61.2 kg (135 lb), last menstrual period 08/02/2017, SpO2 100 %.Body mass index is 25.51 kg/m.  General Appearance: Casual  Eye Contact:  Good  Speech:  Clear and Coherent and Normal Rate  Volume:  Normal  Mood:  Euthymic  Affect:  Appropriate  Thought Process:  Goal Directed and Descriptions of Associations: Intact  Orientation:  Full (Time, Place, and Person)  Thought Content:  WDL  Suicidal Thoughts:  No  Homicidal Thoughts:  No  Memory:  Immediate;   Good Recent;   Good Remote;   Good  Judgement:  Good  Insight:  Good  Psychomotor Activity:  Normal  Concentration:  Concentration: Good and Attention Span: Good  Recall:  Good  Fund of Knowledge:  Good  Language:  Good  Akathisia:  No  Handed:  Right  AIMS (if indicated):     Assets:  Communication Skills Desire for Improvement Financial Resources/Insurance Housing Physical Health Social Support Transportation  ADL's:  Intact  Cognition:  WNL  Sleep:  Number of Hours: 6.75     Have you used any form of tobacco in the last 30 days? (Cigarettes, Smokeless Tobacco, Cigars, and/or Pipes): No  Has this patient used any form of tobacco in the last 30 days? (Cigarettes, Smokeless Tobacco, Cigars, and/or Pipes) Yes, No  Blood Alcohol level:  Lab Results  Component Value Date   ETH <10 43/32/9518    Metabolic Disorder Labs:  Lab Results  Component Value Date   HGBA1C 5.8 02/22/2016   No results found for: PROLACTIN Lab Results  Component Value Date    CHOL 120 06/20/2017   TRIG 70 06/20/2017   HDL 37 (L) 06/20/2017   CHOLHDL 3.2 06/20/2017   VLDL 20 12/31/2016   LDLCALC 69 06/20/2017   LDLCALC 83 12/31/2016  See Psychiatric Specialty Exam and Suicide Risk Assessment completed by Attending Physician prior to discharge.  Discharge destination:  Home  Is patient on multiple antipsychotic therapies at discharge:  No   Has Patient had three or more failed trials of antipsychotic monotherapy by history:  No  Recommended Plan for Multiple Antipsychotic Therapies: NA   Allergies as of 08/31/2017      Reactions   Pork-derived Products       Medication List    STOP taking these medications   clonazePAM 1 MG tablet Commonly known as:  KLONOPIN   fluticasone 50 MCG/ACT nasal spray Commonly known as:  FLONASE   hyaluronate sodium Gel   hydrOXYzine 25 MG capsule Commonly known as:  VISTARIL   metoprolol tartrate 50 MG tablet Commonly known as:  LOPRESSOR   OMEGA 3 PO   traMADol 50 MG tablet Commonly known as:  ULTRAM     TAKE these medications     Indication  amLODipine 5 MG tablet Commonly known as:  NORVASC Take 1 tablet (5 mg total) by mouth daily.  Indication:  High Blood Pressure Disorder   ARIPiprazole 10 MG tablet Commonly known as:  ABILIFY Take 1 tablet (10 mg total) by mouth daily. For mood control Start taking on:  09/01/2017 What changed:  additional instructions  Indication:  Hair Loss by Pulling or Twisting, mood stability   aspirin 81 MG chewable tablet Chew 1 tablet (81 mg total) by mouth daily.  Indication:  Per PCP   atorvastatin 80 MG tablet Commonly known as:  LIPITOR Take 1 tablet (80 mg total) by mouth daily. What changed:  when to take this  Indication:  High Amount of Fats in the Blood   baclofen 10 MG tablet Commonly known as:  LIORESAL Take up to 4/day for muscle spasms What changed:    how much to take  how to take this  when to take this  reasons to take  this  additional instructions  Indication:  Per PCP   folic acid 1 MG tablet Commonly known as:  FOLVITE Take 1 mg by mouth daily.  Indication:  per PCP   furosemide 20 MG tablet Commonly known as:  LASIX TAKE 1 TABLET(20 MG) BY MOUTH DAILY AS NEEDED What changed:  See the new instructions.  Indication:  High Blood Pressure Disorder   gabapentin 300 MG capsule Commonly known as:  NEURONTIN Take 1 capsule (300 mg total) by mouth 2 (two) times daily. What changed:    how much to take  how to take this  when to take this  additional instructions  Indication:  for withdrawal   hydrOXYzine 25 MG tablet Commonly known as:  ATARAX/VISTARIL Take 1 tablet (25 mg total) by mouth every 6 (six) hours as needed for anxiety.  Indication:  Feeling Anxious   lisinopril 20 MG tablet Commonly known as:  PRINIVIL,ZESTRIL Take 1 tablet (20 mg total) by mouth 2 (two) times daily.  Indication:  High Blood Pressure Disorder   nitroGLYCERIN 0.4 MG SL tablet Commonly known as:  NITROSTAT Place 1 tablet (0.4 mg total) under the tongue every 5 (five) minutes as needed for chest pain.  Indication:  for chest pain   non-metallic deodorant Misc Commonly known as:  ALRA Apply 1 application topically.  Indication:  Per PCP   pantoprazole 40 MG tablet Commonly known as:  PROTONIX Take 1 tablet (40 mg total) by mouth 2 (two) times daily.  Indication:  Gastroesophageal Reflux Disease  prasugrel 10 MG Tabs tablet Commonly known as:  EFFIENT Take 1 tablet (10 mg total) by mouth daily. Start taking on:  09/01/2017  Indication:  Acute Coronary Syndrome   PROBIOTIC PO Take 1 tablet by mouth daily.  Indication:  per PCP   sertraline 50 MG tablet Commonly known as:  ZOLOFT Take 1 tablet (50 mg total) by mouth daily. For mood control Start taking on:  09/01/2017  Indication:  mood stability   tamoxifen 20 MG tablet Commonly known as:  NOLVADEX Take 1 tablet (20 mg total) by mouth  daily.  Indication:  per PCP   traZODone 50 MG tablet Commonly known as:  DESYREL Take 1 tablet (50 mg total) by mouth at bedtime as needed for sleep.  Indication:  Trouble Sleeping      Follow-up [place] Follow up on 09/03/2017.   Specialty:  Behavioral Health Why:  Medications management w Dr Adele Schilder on 12/7 at 12:15 PM.  Therapy appointment w Anderson Malta on 11/26 at 1 PM Contact information: [place] [id number] [place] [phone]          Follow-up recommendations:  Continue activity as tolerated. Continue diet as recommended by your PCP. Ensure to keep all appointments with outpatient providers.  Comments:  Patient is instructed prior to discharge to: Take all medications as prescribed by his/her mental healthcare provider. Report any adverse effects and or reactions from the medicines to his/her outpatient provider promptly. Patient has been instructed & cautioned: To not engage in alcohol and or illegal drug use while on prescription medicines. In the event of worsening symptoms, patient is instructed to call the crisis hotline, 911 and or go to the nearest ED for appropriate evaluation and treatment of symptoms. To follow-up with his/her primary care provider for your other medical issues, concerns and or health care needs.    Signed: Lowry Ram Lucilla Petrenko, FNP 08/31/2017, 1:15 PM

## 2017-08-31 NOTE — Progress Notes (Signed)
  [facility] Adult Case Management Discharge Plan :  Will you be returning to the same living situation after discharge:  Yes,  Home At discharge, do you have transportation home?: Yes,  Yes Do you have the ability to pay for your medications: Yes,  Insurance  Release of information consent forms completed and in the chart;  Patient's signature needed at discharge.  Patient to Follow up at: Follow-up Information    BE[facility]IORAL HEALTH OUTPATIENT THERAPY [place] Follow up on 09/03/2017.   Specialty:  Behavioral Health Why:  Medications management w Dr Adele Schilder on 12/7 at 12:15 PM.  Therapy appointment w Anderson Malta on 11/26 at 1 PM Contact information: [place] [id number] [place] [phone]          Next level of care provider has access to [place] and Suicide Prevention discussed: Yes,  Mother  Have you used any form of tobacco in the last 30 days? (Cigarettes, Smokeless Tobacco, Cigars, and/or Pipes): No  Has patient been referred to the Quitline?: N/A patient is not a smoker  Patient has been referred for addiction treatment: N/A  Darin Engels, LCSW 08/31/2017, 9:57 AM

## 2017-08-31 NOTE — BHH Suicide Risk Assessment (Signed)
[facility] Discharge Suicide Risk Assessment   Principal Problem: MDD (major depressive disorder), recurrent episode, severe ([place]) Discharge Diagnoses:  Patient Active Problem List   Diagnosis Date Noted  . MDD (major depressive disorder), recurrent episode, severe ([place]) [F33.2] 08/28/2017  . Neck stiffness [M43.6] 08/22/2017  . Abnormal finding on MRI of brain [R90.89] 08/22/2017  . HOCM (hypertrophic obstructive cardiomyopathy) ([place]) [I42.1]   . Chest pain [R07.9] 05/06/2017  . Genetic testing [Z13.79] 04/23/2017  . Malignant neoplasm of lower-outer quadrant of right breast of female, estrogen receptor positive ([place]) [C50.511, Z17.0] 03/29/2017  . Dysesthesia [R20.8] 03/12/2017  . Palpitations [R00.2] 02/23/2017  . S/P cardiac cath (12/2016) a. acute total occlusion of the RCA tx w/PCI DES, b. mild nonobs LAD/LCx stenosis, c. mild segmental LV sys dx w/ sev hypokinesis, LVEF 50-55% [Z98.890] 01/02/2017  . Non-ST elevation myocardial infarction (NSTEMI), subsequent episode of care ([place]) [I21.4] 12/31/2016  . 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 Chronic kidney disease [N18.9]   . ST elevation myocardial infarction involving right coronary artery ([place]) [I21.11]   . Abnormal EKG [R94.31]   . Nausea [R11.0]   . Ischemic cardiomyopathy [I25.5]   . Muscle spasms of both lower extremities [M62.838] 04/17/2016  . Gait disturbance [R26.9] 01/25/2016  . 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 Acute-on-chronic kidney injury ([place]) [N17.9, N18.9] 11/12/2015  . Neck pain [M54.2] 11/12/2015  . Hypokalemia [E87.6] 11/12/2015  . Abnormal MRI, neck [R93.89] 11/12/2015  . Numbness [R20.0] 11/12/2015  . Hypertension [I10]   . Hyperlipidemia [E78.5]   . Coronary artery disease involving native coronary artery of native heart without angina pectoris [I25.10]   . Facial numbness [R20.0]   . Right arm numbness [R20.2]   . Throat pain [R07.0] 11/09/2015  . Drooling [K11.7] 11/09/2015  . CAD (coronary  artery disease), native coronary artery [I25.10] 09/15/2014  . Headache [R51] 09/15/2014  . UTI (urinary tract infection): Probable [N39.0] 09/14/2014  . Candida infection of genital region [B37.49] 09/14/2014  . Unstable angina ([place]) [I20.0] 09/13/2014  . HLD (hyperlipidemia) [E78.5] 09/13/2014  . Otitis [H66.90] 09/13/2014  . ACS (acute coronary syndrome) ([place]) [I24.9] 09/13/2014  . Tobacco abuse [Z72.0] 09/13/2014  . Essential hypertension [I10] 09/09/2014    Total Time spent with patient: 30 minutes  Musculoskeletal: Strength & Muscle Tone: within normal limits Gait & Station: normal Patient leans: N/A  Psychiatric Specialty Exam: Review of Systems  Constitutional: Negative.   HENT: Negative.   Eyes: Negative.   Respiratory: Negative.   Cardiovascular: Negative.   Gastrointestinal: Negative.   Genitourinary: Negative.   Musculoskeletal: Negative.   Skin: Negative.   Neurological: Negative.   Endo/Heme/Allergies: Negative.   Psychiatric/Behavioral: Negative for depression, hallucinations, memory loss, substance abuse and suicidal ideas. The patient is not nervous/anxious and does not have insomnia.     Blood pressure (!) 166/89, pulse 91, temperature 97.9 F (36.6 C), temperature source Oral, resp. rate 18, height 5\' 1"  (1.549 m), weight 61.2 kg (135 lb), last menstrual period 08/02/2017, SpO2 100 %.Body mass index is 25.51 kg/m.  General Appearance: Neatly dressed, pleasant, engaging well and cooperative. Appropriate behavior. Not in any distress. Good relatedness. Not internally stimulated  Eye Contact::  Good  Speech:  Spontaneous, normal prosody. Normal tone and rate.   Volume:  Normal  Mood:  Euthymic  Affect:  Appropriate and Full Range  Thought Process:  Linear  Orientation:  Full (Time, Place, and Person)  Thought Content:  Future oriented. No delusional theme. No preoccupation with violent thoughts.  No negative ruminations. No obsession.  No hallucination in  any modality.   Suicidal Thoughts:  No  Homicidal Thoughts:  No  Memory:  Immediate;   Good Recent;   Good Remote;   Good  Judgement:  Good  Insight:  Good  Psychomotor Activity:  Normal  Concentration:  Good  Recall:  Good  Fund of Knowledge:Good  Language: Good  Akathisia:  Negative  Handed:    AIMS (if indicated):     Assets:  Communication Skills Desire for Improvement Financial Resources/Insurance Housing Leisure Time Resilience Talents/Skills Transportation Vocational/Educational  Sleep:  Number of Hours: 6.75  Cognition: WNL  ADL's:  Intact   Clinical Assessment::   46 y.o AAF female, single, lives alone. Employed. Background history of Unipolar depression and multiple medical comorbidity. Presented to the ER in company of her mom . Reports increasing depression and anxiety. She has been feeling more hopeless. She was recently started on Abilify and felt worse on the medication. She had expressed thoughts to hang herself or stab herself. Routine labs is significant for chronic renal impairment, anemia and mildly elevated WBC.  UDS is positive for THC. No alcohol.   Seen today. Says she feels much better. She is not feeling as depressed as she felt at presentation. Her anxiety levels are much improved. Says she is looking forward to be with her family today. Reports normal energy and interest. She has been maintaining normal biological functions. She is able to think clearly. She is able to focus on task. Her thoughts are not crowded or racing. No evidence of mania. No hallucination in any modality. He is not making any delusional statement. No passivity of will/thought. He is fully in touch with reality. No thoughts of suicide. No thoughts of homicide. No violent thoughts. No overwhelming anxiety.  Nursing staff reports that patient has been appropriate on the unit. Patient has been interacting well with peers. No behavioral issues. Patient has not voiced any suicidal  thoughts. Patient has not been observed to be internally stimulated. Patient has been adherent with treatment recommendations. Patient has been tolerating their medication well.   Patient was discussed at team. Team members feels that patient is back to her baseline level of function. Team agrees with plan to discharge patient today.   Demographic Factors:  Living alone  Loss Factors: Decline in physical health  Historical Factors: NA  Risk Reduction Factors:   Sense of responsibility to family, Religious beliefs about death, Employed, Living with another person, especially a relative, Positive social support, Positive therapeutic relationship and Positive coping skills or problem solving skills  Continued Clinical Symptoms:  As above   Cognitive Features That Contribute To Risk:  None    Suicide Risk:  Minimal: No identifiable suicidal ideation.  Patient is not having any thoughts of suicide at this time. Modifiable risk factors targeted during this admission includes depression and anxiety. Demographical and historical risk factors cannot be modified. Patient is now engaging well. Patient is reliable and is future oriented. We have buffered patient's support structures. At this point, patient is at low risk of suicide. Patient is aware of the effects of psychoactive substances on decision making process. Patient has been provided with emergency contacts. Patient acknowledges to use resources provided if unforseen circumstances changes their current risk stratification.   Follow-up Information    BEHAVIORAL HEALTH OUTPATIENT THERAPY [place] Follow up on 09/03/2017.   Specialty:  Behavioral Health Why:  Medications management w Dr Adele Schilder on 12/7 at 12:15 PM.  Therapy appointment w Anderson Malta on 11/26 at 1 PM Contact information: [place] [id number] [place] [phone]          Plan Of Care/Follow-up recommendations:  1. Continue  current psychotropic medications 2. Mental health and addiction follow up as arranged.  3. Discharge in care of her family 4. Provided limited quantity of prescriptions   Artist Beach, MD 08/31/2017, 9:43 AM

## 2017-08-31 NOTE — Tx Team (Signed)
Interdisciplinary Treatment and Diagnostic Plan Update  08/31/2017 Time of Session: 9:00 AM Anna Henson MRN: [id number]  Principal Diagnosis: MDD (major depressive disorder), recurrent episode, severe ([place])  Secondary Diagnoses: Principal Problem:   MDD (major depressive disorder), recurrent episode, severe ([place])   Current Medications:  Current Facility-Administered Medications  Medication Dose Route Frequency Provider Last Rate Last Dose  . acetaminophen (TYLENOL) tablet 650 mg  650 mg Oral Q6H PRN Patriciaann Clan E, PA-C      . amLODipine (NORVASC) tablet 5 mg  5 mg Oral Daily Derrill Center, NP   5 mg at 08/31/17 0754  . ARIPiprazole (ABILIFY) tablet 10 mg  10 mg Oral Daily Derrill Center, NP   10 mg at 08/31/17 0754  . aspirin chewable tablet 81 mg  81 mg Oral Daily Derrill Center, NP   81 mg at 08/31/17 0754  . atorvastatin (LIPITOR) tablet 80 mg  80 mg Oral q1800 Derrill Center, NP   80 mg at 08/30/17 1717  . baclofen (LIORESAL) tablet 10 mg  10 mg Oral QID PRN Artist Beach, MD   10 mg at 08/30/17 2103  . feeding supplement (ENSURE ENLIVE) (ENSURE ENLIVE) liquid 237 mL  237 mL Oral BID PRN Cobos, Myer Peer, MD      . folic acid (FOLVITE) tablet 1 mg  1 mg Oral Daily Izediuno, Laruth Bouchard, MD   1 mg at 08/31/17 0754  . furosemide (LASIX) tablet 20 mg  20 mg Oral Daily Derrill Center, NP   20 mg at 08/31/17 0755  . gabapentin (NEURONTIN) capsule 300 mg  300 mg Oral BID Derrill Center, NP   300 mg at 08/31/17 0753  . hydrOXYzine (ATARAX/VISTARIL) tablet 25 mg  25 mg Oral Q6H PRN Patriciaann Clan E, PA-C      . lisinopril (PRINIVIL,ZESTRIL) tablet 20 mg  20 mg Oral BID Artist Beach, MD   20 mg at 08/31/17 0753  . magnesium hydroxide (MILK OF MAGNESIA) suspension 30 mL  30 mL Oral Daily PRN Patriciaann Clan E, PA-C      . nitroGLYCERIN (NITROSTAT) SL tablet 0.4 mg  0.4 mg Sublingual Q5 min PRN Derrill Center, NP      . omega-3 acid ethyl esters (LOVAZA) capsule 2 g  2 g  Oral BID Derrill Center, NP   2 g at 08/31/17 0754  . prasugrel (EFFIENT) tablet 10 mg  10 mg Oral Daily Izediuno, Laruth Bouchard, MD   10 mg at 08/31/17 0755  . sertraline (ZOLOFT) tablet 50 mg  50 mg Oral Daily Money, Lowry Ram, FNP   50 mg at 08/31/17 0754  . tamoxifen (NOLVADEX) tablet 20 mg  20 mg Oral Daily Derrill Center, NP   20 mg at 08/31/17 0753  . traZODone (DESYREL) tablet 50 mg  50 mg Oral QHS,MR X 1 Laverle Hobby, PA-C   50 mg at 08/30/17 2103   Facility-Administered Medications Ordered in Other Encounters  Medication Dose Route Frequency Provider Last Rate Last Dose  . gadopentetate dimeglumine (MAGNEVIST) injection 16 mL  16 mL Intravenous Once PRN Sater, Nanine Means, MD       PTA Medications: Medications Prior to Admission  Medication Sig Dispense Refill Last Dose  . amLODipine (NORVASC) 5 MG tablet Take 1 tablet (5 mg total) by mouth daily. 30 tablet 11 08/27/2017 at Unknown time  . ARIPiprazole (ABILIFY) 10 MG tablet Take 1 tablet (10 mg total) daily by mouth. Bell  tablet 1 08/27/2017 at Unknown time  . aspirin 81 MG chewable tablet Chew 1 tablet (81 mg total) by mouth daily.   08/27/2017 at Unknown time  . atorvastatin (LIPITOR) 80 MG tablet Take 1 tablet (80 mg total) by mouth daily. (Patient taking differently: Take 80 mg by mouth daily at 6 PM. ) 90 tablet 0 08/26/2017 at Unknown time  . baclofen (LIORESAL) 10 MG tablet Take up to 4/day for muscle spasms (Patient taking differently: Take 10 mg 4 (four) times daily as needed by mouth for muscle spasms (1 tablet in the morning and 1 inth eevenining and 2 at bedtime). ) 120 each 11 08/27/2017 at Unknown time  . clonazePAM (KLONOPIN) 1 MG tablet 1/2 to 1 pill qHS (Patient taking differently: Take 0.5 mg at bedtime by mouth. ) 30 tablet 5 08/26/2017 at Unknown time  . fluticasone (FLONASE) 50 MCG/ACT nasal spray SHAKE LIQUID AND USE 2 SPRAYS IN EACH NOSTRIL DAILY (Patient taking differently: SHAKE LIQUID AND USE 2 SPRAYS IN EACH  NOSTRIL DAILY as need rhinitis) 16 g 5 08/27/2017 at Unknown time  . folic acid (FOLVITE) 1 MG tablet Take 1 mg by mouth daily.   08/27/2017 at Unknown time  . furosemide (LASIX) 20 MG tablet TAKE 1 TABLET(20 MG) BY MOUTH DAILY AS NEEDED (Patient taking differently: TAKE 1 TABLET(20 MG) BY MOUTH DAILY AS NEEDED FOR FLUID RETENTION) 10 tablet 0 Past Week at prn  . gabapentin (NEURONTIN) 300 MG capsule Take one pill in the morning, two in the afternoon and two po at bedtime (Patient taking differently: Take 300-600 mg See admin instructions by mouth. Take 300 mg capsule every morning then take 600 mg capsules in the afternoon and at 600 mg at bedtime) 150 capsule 11 08/27/2017 at Unknown time  . hyaluronate sodium (RADIAPLEXRX) GEL Apply 1 application topically 2 (two) times daily. Apply to skin area after rd txs and bedtime, nothing 4 hours prior to rad tx   08/27/2017 at Unknown time  . hydrOXYzine (VISTARIL) 25 MG capsule Take 1-2 capsule at bed time for anxiety and insomnia (Patient taking differently: Take 50 mg at bedtime by mouth. Take 1-2 capsule at bed time for anxiety and insomnia) 60 capsule 1 08/26/2017 at Unknown time  . lisinopril (PRINIVIL,ZESTRIL) 20 MG tablet Take 1 tablet (20 mg total) by mouth 2 (two) times daily. 180 tablet 3 08/27/2017 at Unknown time  . metoprolol tartrate (LOPRESSOR) 50 MG tablet Take 0.5 tablets (25 mg total) by mouth 2 (two) times daily. 90 tablet 3 08/27/2017 at 1000  . nitroGLYCERIN (NITROSTAT) 0.4 MG SL tablet Place 1 tablet (0.4 mg total) under the tongue every 5 (five) minutes as needed for chest pain. 25 tablet 1 unknown at prn  . non-metallic deodorant Jethro Poling) MISC Apply 1 application topically.   08/27/2017 at Unknown time  . Omega-3 Fatty Acids (OMEGA 3 PO) Take 2 capsules by mouth daily.   Taking  . pantoprazole (PROTONIX) 40 MG tablet Take 1 tablet (40 mg total) by mouth 2 (two) times daily. 30 tablet 6 08/27/2017 at Unknown time  . Probiotic Product  (PROBIOTIC PO) Take 1 tablet by mouth daily.   Past Month at Unknown time  . tamoxifen (NOLVADEX) 20 MG tablet Take 1 tablet (20 mg total) by mouth daily. 90 tablet 3 08/27/2017 at Unknown time  . traMADol (ULTRAM) 50 MG tablet Take 1 tablet (50 mg total) by mouth 4 (four) times daily as needed. (Patient taking differently: Take 50-100 mg by  mouth every 12 (twelve) hours as needed for moderate pain or severe pain (depends on pain level if takes 1-2 tablets). ) 120 tablet 4 08/25/2017 at prn    Patient Stressors: Health problems Other: stressful job  Patient Strengths: Capable of independent living Social worker for treatment/growth Supportive family/friends  Treatment Modalities: Medication Management, Group therapy, Case management,  1 to 1 session with clinician, Psychoeducation, Recreational therapy.   Physician Treatment Plan for Primary Diagnosis: MDD (major depressive disorder), recurrent episode, severe ([place]) Long Term Goal(s): Improvement in symptoms so as ready for discharge Improvement in symptoms so as ready for discharge   Short Term Goals: Ability to identify changes in lifestyle to reduce recurrence of condition will improve Ability to verbalize feelings will improve Ability to disclose and discuss suicidal ideas Ability to demonstrate self-control will improve Ability to identify and develop effective coping behaviors will improve Ability to maintain clinical measurements within normal limits will improve Compliance with prescribed medications will improve Ability to identify changes in lifestyle to reduce recurrence of condition will improve Ability to verbalize feelings will improve Ability to disclose and discuss suicidal ideas Ability to demonstrate self-control will improve Ability to identify and develop effective coping behaviors will improve Ability to maintain clinical measurements within normal limits will improve Compliance with prescribed  medications will improve  Medication Management: Evaluate patient's response, side effects, and tolerance of medication regimen.  Therapeutic Interventions: 1 to 1 sessions, Unit Group sessions and Medication administration.  Evaluation of Outcomes: Adequate for Discharge  Physician Treatment Plan for Secondary Diagnosis: Principal Problem:   MDD (major depressive disorder), recurrent episode, severe ([place])  Long Term Goal(s): Improvement in symptoms so as ready for discharge Improvement in symptoms so as ready for discharge   Short Term Goals: Ability to identify changes in lifestyle to reduce recurrence of condition will improve Ability to verbalize feelings will improve Ability to disclose and discuss suicidal ideas Ability to demonstrate self-control will improve Ability to identify and develop effective coping behaviors will improve Ability to maintain clinical measurements within normal limits will improve Compliance with prescribed medications will improve Ability to identify changes in lifestyle to reduce recurrence of condition will improve Ability to verbalize feelings will improve Ability to disclose and discuss suicidal ideas Ability to demonstrate self-control will improve Ability to identify and develop effective coping behaviors will improve Ability to maintain clinical measurements within normal limits will improve Compliance with prescribed medications will improve     Medication Management: Evaluate patient's response, side effects, and tolerance of medication regimen.  Therapeutic Interventions: 1 to 1 sessions, Unit Group sessions and Medication administration.  Evaluation of Outcomes: Adequate for Discharge   RN Treatment Plan for Primary Diagnosis: MDD (major depressive disorder), recurrent episode, severe ([place]) Long Term Goal(s): Knowledge of disease and therapeutic regimen to maintain health will improve  Short Term Goals: Ability to participate in  decision making will improve, Ability to disclose and discuss suicidal ideas and Ability to identify and develop effective coping behaviors will improve  Medication Management: RN will administer medications as ordered by provider, will assess and evaluate patient's response and provide education to patient for prescribed medication. RN will report any adverse and/or side effects to prescribing provider.  Therapeutic Interventions: 1 on 1 counseling sessions, Psychoeducation, Medication administration, Evaluate responses to treatment, Monitor vital signs and CBGs as ordered, Perform/monitor CIWA, COWS, AIMS and Fall Risk screenings as ordered, Perform wound care treatments as ordered.  Evaluation of Outcomes: Adequate for Discharge  LCSW Treatment Plan for Primary Diagnosis: MDD (major depressive disorder), recurrent episode, severe ([place]) Long Term Goal(s): Safe transition to appropriate next level of care at discharge, Engage patient in therapeutic group addressing interpersonal concerns.  Short Term Goals: Engage patient in aftercare planning with referrals and resources, Increase ability to appropriately verbalize feelings, Increase emotional regulation and Increase skills for wellness and recovery  Therapeutic Interventions: Assess for all discharge needs, 1 to 1 time with Social worker, Explore available resources and support systems, Assess for adequacy in community support network, Educate family and significant other(s) on suicide prevention, Complete Psychosocial Assessment, Interpersonal group therapy.  Evaluation of Outcomes: Adequate for Discharge   Progress in Treatment: Attending groups: Yes. Participating in groups: Yes. Taking medication as prescribed: Yes. Toleration medication: Yes. Family/Significant other contact made: Yes, individual(s) contacted:  mother Patient understands diagnosis: Yes. Discussing patient identified problems/goals with staff: Yes. Medical problems  stabilized or resolved: Yes. Denies suicidal/homicidal ideation: Yes. Issues/concerns per patient self-inventory: No. Other: NA  New problem(s) identified: No, Describe:  none at this time  New Short Term/Long Term Goal(s):  "better coping skills" "able to deal w stressful work environment or make plan to change jobs"  Discharge Plan or Barriers: return home, follow up outpatient  Reason for Continuation of Hospitalization: Depression Medication stabilization Suicidal ideation  Estimated Length of Stay:  3 - 5 days, DC 08/31/17  Attendees: Patient: 08/31/2017 9:58 AM  Physician: Marchelle Folks MD 08/31/2017 9:58 AM  Nursing: Rise Paganini RN; Cyndia Bent RN 08/31/2017 9:58 AM  RN Care Manager: 08/31/2017 9:58 AM  Social Worker: Eusebio Me Isaiah Blakes LCSWA 08/31/2017 9:58 AM  Recreational Therapist:  08/31/2017 9:58 AM  Other:  08/31/2017 9:58 AM  Other:  08/31/2017 9:58 AM  Other: 08/31/2017 9:58 AM    Scribe for Treatment Team: Beverely Pace, LCSW 08/31/2017 9:58 AM

## 2017-08-31 NOTE — BHH Group Notes (Signed)
Type of Therapy and Topic:  Group Therapy:  Relapse Prevention and Recovery   Participation Level:  Did not attend   Description of Group:    In this group patients will be encouraged to explore what they see as obstacles to their own wellness and recovery. They will be guided to discuss their thoughts, feelings, and behaviors related to these obstacles. The group will process together ways to cope with barriers, with attention given to specific choices patients can make. Each patient will be challenged to identify changes they are motivated to make in order to overcome their obstacles. This group will be process-oriented, with patients participating in exploration of their own experiences as well as giving and receiving support and challenge from other group members.   Therapeutic Goals: 1. Patient will identify personal and current obstacles as they relate to admission. 2. Patient will identify barriers that currently interfere with their wellness or overcoming obstacles.  3. Patient will identify feelings, thought process and behaviors related to these barriers. 4. Patient will identify two changes they are willing to make to overcome these obstacles:      Summary of Patient Progress  Did not attend   Therapeutic Modalities:   Cognitive Behavioral Therapy Solution Focused Therapy Motivational Interviewing Relapse Prevention Therapy    Darin Engels MSW, [facility] 08/31/2017 3:27 PM

## 2017-08-31 NOTE — Progress Notes (Signed)
Pt discharged home with her mother. Pt was ambulatory, stable and appreciative at that time. All papers and prescriptions were given and valuables returned. Verbal understanding expressed. Denies SI/HI and A/VH. Pt given opportunity to express concerns and ask questions.

## 2017-09-03 ENCOUNTER — Ambulatory Visit (INDEPENDENT_AMBULATORY_CARE_PROVIDER_SITE_OTHER): Payer: Federal, State, Local not specified - PPO | Admitting: Psychiatry

## 2017-09-03 ENCOUNTER — Ambulatory Visit
Admission: RE | Admit: 2017-09-03 | Discharge: 2017-09-03 | Disposition: A | Discharge status: Home / Self Care | Payer: Federal, State, Local not specified - PPO | Source: Ambulatory Visit | Attending: Radiation Oncology | Admitting: Radiation Oncology

## 2017-09-03 DIAGNOSIS — F411 Generalized anxiety disorder: Secondary | ICD-10-CM | POA: Diagnosis not present

## 2017-09-03 DIAGNOSIS — F319 Bipolar disorder, unspecified: Secondary | ICD-10-CM

## 2017-09-03 DIAGNOSIS — C50511 Malignant neoplasm of lower-outer quadrant of right female breast: Secondary | ICD-10-CM | POA: Diagnosis not present

## 2017-09-03 DIAGNOSIS — Z170 Estrogen receptor positive status [ER+]: Secondary | ICD-10-CM | POA: Diagnosis present

## 2017-09-04 ENCOUNTER — Ambulatory Visit
Admission: RE | Admit: 2017-09-04 | Discharge: 2017-09-04 | Disposition: A | Discharge status: Home / Self Care | Payer: Federal, State, Local not specified - PPO | Source: Ambulatory Visit | Attending: Radiation Oncology | Admitting: Radiation Oncology

## 2017-09-04 ENCOUNTER — Ambulatory Visit (INDEPENDENT_AMBULATORY_CARE_PROVIDER_SITE_OTHER): Payer: Federal, State, Local not specified - PPO

## 2017-09-04 DIAGNOSIS — R200 Anesthesia of skin: Secondary | ICD-10-CM | POA: Diagnosis not present

## 2017-09-04 DIAGNOSIS — G373 Acute transverse myelitis in demyelinating disease of central nervous system: Secondary | ICD-10-CM

## 2017-09-04 DIAGNOSIS — C50511 Malignant neoplasm of lower-outer quadrant of right female breast: Secondary | ICD-10-CM | POA: Diagnosis not present

## 2017-09-04 MED ORDER — GADOPENTETATE DIMEGLUMINE 469.01 MG/ML IV SOLN: 12.0000 mL | Freq: Once | INTRAVENOUS | Status: DC | PRN

## 2017-09-05 ENCOUNTER — Ambulatory Visit
Admission: RE | Admit: 2017-09-05 | Discharge: 2017-09-05 | Disposition: A | Discharge status: Home / Self Care | Payer: Federal, State, Local not specified - PPO | Source: Ambulatory Visit | Attending: Radiation Oncology | Admitting: Radiation Oncology

## 2017-09-05 ENCOUNTER — Telehealth: Payer: Self-pay | Admitting: Hematology and Oncology

## 2017-09-05 DIAGNOSIS — C50511 Malignant neoplasm of lower-outer quadrant of right female breast: Secondary | ICD-10-CM | POA: Diagnosis not present

## 2017-09-05 NOTE — Progress Notes (Signed)
   THERAPIST PROGRESS NOTE   Session Time: 1:05-2:00  Participation Level: Active  Behavioral Response: CasualAlertAnxious  Type of Therapy: Individual Therapy  Treatment Goals addressed: Management of anxiety and depression  Interventions: CBT  Summary: Anna Henson is a 46 y.o. female who presents with GAD, Depression.   Suicidal/Homicidal: Nowithout intent/plan  Therapist Response: Pt. Presented for first session since discharge from inpatient hospitalization. Pt. Discussed that she feels much stronger and that she no longer is experiencing suicidal ideation. Pt. Disclosed that she had used marijuana intermittently to self-medicate her anxiety and depression prior to her hospitalization and has not used in several weeks. Pt. Reports that now that she is receiving consistent psychiatric treatment that she is no longer using marijuana. Pt. Discussed that since discharge she has been hesitant to be alone in her home and has needed the emotional and physical support of her mother. Significant time in session was spent normalizing the need for physical and emotional support during a mental health crisis. Pt. Discussed that her mother is a healthy source of support. Pt. Discussed that she plans to stay in [place] because her mother is here. Pt. Discussed that her brother and niece came from [place] for the Thanksgiving holiday and she was able to spend time with them. Pt. Discussed her ongoing cancer treatment and her plans to continue with treatment and her plans to return to work.  Plan: Return again in 2 weeks. Pt,. To continue with solution-focused, strength based therapy.  Diagnosis:      Bipolar Disorder, Generalized Anxiety Disorder    Nancie Neas, [facility] 09/05/2017

## 2017-09-05 NOTE — Telephone Encounter (Signed)
09/05/2017 @ 4:50 pm Anna Henson came and signed an ROI and I released progress notes and labs to her from Dr. Geralyn Flash office

## 2017-09-06 ENCOUNTER — Encounter: Payer: Self-pay | Admitting: General Practice

## 2017-09-06 ENCOUNTER — Ambulatory Visit
Admission: RE | Admit: 2017-09-06 | Discharge: 2017-09-06 | Disposition: A | Discharge status: Home / Self Care | Payer: Federal, State, Local not specified - PPO | Source: Ambulatory Visit | Attending: Radiation Oncology | Admitting: Radiation Oncology

## 2017-09-06 DIAGNOSIS — C50511 Malignant neoplasm of lower-outer quadrant of right female breast: Secondary | ICD-10-CM | POA: Diagnosis not present

## 2017-09-06 NOTE — Progress Notes (Unsigned)
[facility] Psychosocial Distress Screening Clinical Social Work  Clinical Social Work was referred by distress screening protocol.  The patient scored a 5 on the Psychosocial Distress Thermometer which indicates moderate distress. Clinical Social Worker Edwyna Shell to assess for distress and other psychosocial needs. Reports some "depressive moments last week but no suicidal thoughts right now"; "I am trying to be OK."  "Each day is a struggle for me, Im just trying to make it through."  CSW and patient discussed common feeling and emotions when being diagnosed with cancer, and the importance of support during treatment. CSW informed patient of the support team and support services at [facility]. CSW provided contact information and encouraged patient to call with any questions or concerns.  ONCBCN DISTRESS SCREENING 07/25/2017  Screening Type Initial Screening  Distress experienced in past week (1-10) 5  Practical problem type Work/school  Emotional problem type Depression;Nervousness/Anxiety;Adjusting to illness  Spiritual/Religous concerns type   Information Concerns Type   Physical Problem type Pain;Sleep/insomnia;Getting around;Constipation/diarrhea;Tingling hands/feet;Swollen arms/legs  Physician notified of physical symptoms Yes  Referral to support programs     Clinical Social Worker follow up needed: No. Patient states she will call after reviewing mailed packet of resources.    If yes, follow up plan:

## 2017-09-07 ENCOUNTER — Encounter: Payer: Self-pay | Admitting: *Deleted

## 2017-09-07 ENCOUNTER — Telehealth: Payer: Self-pay | Admitting: *Deleted

## 2017-09-07 ENCOUNTER — Ambulatory Visit: Payer: Federal, State, Local not specified - PPO | Admitting: Internal Medicine

## 2017-09-07 ENCOUNTER — Encounter: Payer: Self-pay | Admitting: Internal Medicine

## 2017-09-07 ENCOUNTER — Ambulatory Visit
Admission: RE | Admit: 2017-09-07 | Discharge: 2017-09-07 | Disposition: A | Discharge status: Home / Self Care | Payer: Federal, State, Local not specified - PPO | Source: Ambulatory Visit | Attending: Radiation Oncology | Admitting: Radiation Oncology

## 2017-09-07 VITALS — BP 126/66 | HR 58 | Ht 61.0 in | Wt 141.0 lb

## 2017-09-07 DIAGNOSIS — C50511 Malignant neoplasm of lower-outer quadrant of right female breast: Secondary | ICD-10-CM | POA: Diagnosis not present

## 2017-09-07 DIAGNOSIS — I2510 Atherosclerotic heart disease of native coronary artery without angina pectoris: Secondary | ICD-10-CM

## 2017-09-07 MED ORDER — PRASUGREL HCL 10 MG PO TABS: 10.0000 mg | ORAL_TABLET | Freq: Every day | ORAL | 5 refills | Status: DC

## 2017-09-07 NOTE — Telephone Encounter (Signed)
-----   Message from Britt Bottom, MD sent at 09/07/2017 11:48 AM EST ----- Please let her know that the MRI of the cervical spine did not show any lesions in the spinal cord. 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  She also has a possible pinched nerve the next level down (left C7 nerve root - unchanged since previous MRI)  The MRI of the brain was unchanged.

## 2017-09-07 NOTE — Progress Notes (Signed)
09/07/2017   PCP: Wardell Honour, MD  CC: Routine follow-up  HPI:  46 year old Female hx of hypertension, dyslipidemia, chronic tobacco use, chronic kidney disease stage III admitted with hypertensive urgency 09/13/14 and 6/10 chest pain EKG showed lateral T-wave inversions on admission and cardiology was consulted. It was felt she had unstable angina and she was placed on nitro drip.  Cardiac catheterization with 2.25X 12 mm Promus remainder DES to the M RCA on 09/14/14 for 90% stenosis. EF noted 65- 70%.  She did well and discharged without complication.    She was on numerous BP meds and her BP was running in the 90s to 106 and she felt very weak, lethargic.  The hydralazine was stopped and the cardizem.  She is feeling better.  She has occasional brief chest pressure feeling like the discomfort she was admitted with.  She is here today for follow-up. She is actually doing quite well her questions we discussed where the pneumonia vaccine she is only 32 and essentially in good health told her that was not needed unless she wanted it she does not want a flu vaccine she's had side effects in the past and refuses to take it. She is scheduled for sleep study January 29 2 [facility]. She was recently seen by her primary care for cold symptoms and was placed on Zithromax.  She is feeling better.    She is asking about exercise which I recommended she could go back to to start slowly but work only a peripheral 1-2 weeks. She did not have a heart attack and her EF is normal.  Her mother was with her today who also has stents but the mother is or bare-metal stents. We discussed the difference between the 2 and the need for Plavix with both, and the length of time she would need to take the Plavix.  She is no longer smoking. She is using the NicoDerm.  I saw Ms. Seever back today in the office for follow-up. She denies any chest pain or worsening shortness of breath. She seems to be stable  from a cardiac standpoint after her stent. She does however have an acute complaint. She tells me that about 2 weeks ago she had a viral upper respiratory infection and had a lot of discharge and pus. She has sore throat associated with this. Subsequently she felt a pop on the right side of her head. After that she had pain and numbness on the right side of her face on her right shoulder and down to the right upper arm and right upper back. She's also had difficulty swallowing due to pain particularly in the right side of her throat as well as some mild drooling. She denies any facial droop, right sided weakness, loss of consciousness, change in vision or other associated symptoms. She denies any headache. Blood pressure has been well controlled and is at goal today at 122/76. She was seen in urgent care and treated for acute pansinusitis by Dr. Elder Cyphers and given amoxicillin which she completed. Lab showed no elevated white count, normal renal function, her cholesterol profile was well controlled, and she had a low ESR 15  (which would likely exclude temporal arteritis).  02/23/2017  Ms. Fountaine returns today for follow-up. Unfortunately she suffered a non-ST elevation MI in March 2018. She was found to have total occlusion of the RCA treated with drug-eluting stents. There was some mild nonobstructive disease of the LAD and circumflex and some mild  LV systolic dysfunction with EF 50-55%. She reports doing much better since discharge. She denies any recurrent chest pain but does get short of breath and some fatigue with exertion. She started cardiac rehabilitation but found that it worsened her chronic back pain and now she's discontinued that. She recently saw Ignacia Bayley, NP, in follow-up. She was felt to be doing well although reported having some tachycardia palpitations when awakening from sleep in the middle the night. He was concerned about arrhythmias and placed a monitor. Her monitor showed normal sinus  rhythm without any arrhythmias. She since discontinued that. She is on Brilinta but does not report that her shortness of breath seems to be related to the doses. I advised that she can consider some caffeine after the medication to see if it attenuates some of the symptoms.  05/16/2017  Mrs. Gheen returns today for follow-up. Recently she's noted that her blood pressures been running much higher although was well controlled in the hospital. She denies any chest pain although has had some mild shortness of breath and occasional sharp chest discomfort. Blood pressure is notably been elevated with systolics in the 433I and diastolics over 951O. Yesterday she was advised to take an additional lisinopril which brought her systolic blood pressure down to the 150s. Typically her blood pressures are elevated more in the evening per her report and in the morning. She was present on a diuretic was taken off of that however due to low blood pressure. Unfortunate, she was recently diagnosed with breast cancer and will need a surgery most likely in September. This will be delayed as she is on dual antiplatelet therapy given her recent stent in March 2018.   09/07/2017  History of returns today for follow-up.  She underwent surgery for breast cancer in September.  She was off of her Effient for that procedure and then restarted it.  Since then she has been undergoing radiation therapy.  She seems to be doing fairly well with this.  She denies any recurrent chest pain or worsening shortness of breath.  Lipid profile in September 2018 showed total cholesterol 120, triglycerides 70, HDL 37 and LDL 69.  Current Outpatient Medications  Medication Sig Dispense Refill  . amLODipine (NORVASC) 5 MG tablet Take 1 tablet (5 mg total) by mouth daily. 30 tablet 11  . ARIPiprazole (ABILIFY) 10 MG tablet Take 1 tablet (10 mg total) by mouth daily. For mood control 30 tablet 0  . aspirin 81 MG chewable tablet Chew 1 tablet (81 mg  total) by mouth daily.    Marland Kitchen atorvastatin (LIPITOR) 80 MG tablet Take 1 tablet (80 mg total) by mouth daily. (Patient taking differently: Take 80 mg by mouth daily at 6 PM. ) 90 tablet 0  . baclofen (LIORESAL) 10 MG tablet Take up to 4/day for muscle spasms (Patient taking differently: Take 10 mg 4 (four) times daily as needed by mouth for muscle spasms (1 tablet in the morning and 1 inth eevenining and 2 at bedtime). ) 841 each 11  . folic acid (FOLVITE) 1 MG tablet Take 1 mg by mouth daily.    . furosemide (LASIX) 20 MG tablet TAKE 1 TABLET(20 MG) BY MOUTH DAILY AS NEEDED (Patient taking differently: TAKE 1 TABLET(20 MG) BY MOUTH DAILY AS NEEDED FOR FLUID RETENTION) 10 tablet 0  . gabapentin (NEURONTIN) 300 MG capsule Take 1 capsule (300 mg total) by mouth 2 (two) times daily. 60 capsule 0  . hydrOXYzine (ATARAX/VISTARIL) 25 MG tablet Take 1  tablet (25 mg total) by mouth every 6 (six) hours as needed for anxiety. 30 tablet 0  . lisinopril (PRINIVIL,ZESTRIL) 20 MG tablet Take 1 tablet (20 mg total) by mouth 2 (two) times daily. 180 tablet 3  . non-metallic deodorant (ALRA) MISC Apply 1 application topically.    . pantoprazole (PROTONIX) 40 MG tablet Take 1 tablet (40 mg total) by mouth 2 (two) times daily. 30 tablet 6  . prasugrel (EFFIENT) 10 MG TABS tablet Take 1 tablet (10 mg total) by mouth daily. 30 tablet 0  . Probiotic Product (PROBIOTIC PO) Take 1 tablet by mouth daily.    . sertraline (ZOLOFT) 50 MG tablet Take 1 tablet (50 mg total) by mouth daily. For mood control 30 tablet 0  . tamoxifen (NOLVADEX) 20 MG tablet Take 1 tablet (20 mg total) by mouth daily. 90 tablet 3  . traZODone (DESYREL) 50 MG tablet Take 1 tablet (50 mg total) by mouth at bedtime as needed for sleep. 30 tablet 0  . nitroGLYCERIN (NITROSTAT) 0.4 MG SL tablet Place 1 tablet (0.4 mg total) under the tongue every 5 (five) minutes as needed for chest pain. 25 tablet 1   No current facility-administered medications for this  visit.    Facility-Administered Medications Ordered in Other Visits  Medication Dose Route Frequency Provider Last Rate Last Dose  . gadopentetate dimeglumine (MAGNEVIST) injection 12 mL  12 mL Intravenous Once PRN Sater, Richard A, MD      . gadopentetate dimeglumine (MAGNEVIST) injection 16 mL  16 mL Intravenous Once PRN Sater, Richard A, MD        Past Medical History:  Diagnosis Date  . Anxiety   . Bipolar disorder (HCC)   . Breast cancer, right breast (HCC) ~ 03/2017  . CAD in native artery    a. 09/2014 Cath/PCI in setting of [place] - s/p 2.25 x 12 mm Promus Premier DES to the RCA;  b. 11/2016 Myoview: EF 56%, no ischemia;  c. 12/2016 NSTEMI/PCI: LM nl, LAd 25p, LCX 30p, RCA 100p (2.75x38 Promus Premier DES), mRCA 10 ISR, EF 50-55%.  . Chronic kidney disease    she sthinks has stage 2 CKD, has had US doppler and has some stenosis , this was done in [place]   . CKD (chronic kidney disease), stage II   . Depression   . Dysrhythmia    Tachycardia in evenings  . Genetic testing 04/23/2017   Ms. Weinman underwent genetic counseling and testing for hereditary cancer syndromes on 04/05/2017. Her results were negative for mutations in all 46 genes analyzed by [facility]'s 46-gene Common Hereditary Cancers Panel. Genes analyzed include: APC, ATM, AXIN2, BARD1, BMPR1A, BRCA1, BRCA2, BRIP1, CDH1, CDKN2A, CHEK2, CTNNA1, DICER1, EPCAM, GREM1, HOXB13, KIT, MEN1, MLH1, MSH2, MSH3, MSH6, MUTYH, NBN,  . GERD (gastroesophageal reflux disease)   . Headache    "bi-weekly" (05/07/2017)  . Heart attack (HCC) 12/31/2016  . HOCM (hypertrophic obstructive cardiomyopathy) (HCC)    a. 12/2016 Echo: EF 65-70%,  mod conc LVH, dynamic obstruction @ rest, peak velocity of 291 cm/sec w/ peak gradient of 34mmHg, no rwma, Gr1 DD, triv TR, PASP 15mmHg.  . Hyperlipidemia   . Hypertension    > 5 years  . Migraine    "probably 1/month" (05/07/2017)  . Palpitations   . Pneumonia    "twice when I was a child" (05/07/2017)  .  Tobacco abuse 09/13/2014  . Transverse myelitis (HCC) 2017    Past Surgical History:  Procedure Laterality Date  .   ABDOMINAL HERNIA REPAIR  ~ 2001; ~ 2005  . BREAST BIOPSY Right ~ 03/2017 X 2  . BREAST LUMPECTOMY WITH RADIOACTIVE SEED AND SENTINEL LYMPH NODE BIOPSY Right 06/21/2017   Procedure: RIGHT BREAST BRACKETED SEED GUIDED LUMPECTOMY AND SENTINEL LYMPH NODE BIOPSY;  Surgeon: Jovita Kussmaul, MD;  Location: [facility];  Service: General;  Laterality: Right;  . CORONARY ANGIOPLASTY WITH STENT PLACEMENT     L main OK, LAD OK, CFX 30%, RCA 90%-0% w/ 2.25 x 12 mm Promus Premier DES, EF 55%  . CORONARY/GRAFT ACUTE MI REVASCULARIZATION N/A 12/31/2016   Procedure: Coronary/Graft Acute MI Revascularization;  Surgeon: Sherren Mocha, MD;  Location: [facility];  Service: Cardiovascular;  Laterality: N/A;  . HERNIA REPAIR     times 2  umbilical  . LEFT HEART CATH AND CORONARY ANGIOGRAPHY N/A 12/31/2016   Procedure: Left Heart Cath and Coronary Angiography;  Surgeon: Sherren Mocha, MD;  Location: [facility];  Service: Cardiovascular;  Laterality: N/A;  . LEFT HEART CATHETERIZATION WITH CORONARY ANGIOGRAM N/A 09/14/2014   Procedure: LEFT HEART CATHETERIZATION WITH CORONARY ANGIOGRAM;  Surgeon: Burnell Blanks, MD;  [facility];  Service: Cardiovascular;  Laterality: N/A;  . PERCUTANEOUS CORONARY STENT INTERVENTION (PCI-S)  09/14/2014   Procedure: PERCUTANEOUS CORONARY STENT INTERVENTION (PCI-S);  Surgeon: Burnell Blanks, MD;  Location[facility] C[facility] LAB;  Service: Cardiovascular;;  . TONSILLECTOMY  2005    ROS: Pertinent items noted in HPI and remainder of comprehensive ROS otherwise negative.  Wt Readings from Last 3 Encounters:  09/07/17 141 lb (64 kg)  08/22/17 136 lb 8 oz (61.9 kg)  08/03/17 137 lb (62.1 kg)    PHYSICAL EXAM BP 126/66   Pulse (!) 58   Ht 5' 1" (1.549 m)   Wt 141 lb (64 kg)   SpO2 99%   BMI 26.64 kg/m  General appearance: alert and no  distress Neck: no carotid bruit and no JVD Lungs: clear to auscultation bilaterally Heart: regular rate and rhythm, S1, S2 normal, no murmur, click, rub or gallop Abdomen: soft, non-tender; bowel sounds normal; no masses,  no organomegaly Extremities: extremities normal, atraumatic, no cyanosis or edema Pulses: 2+ and symmetric Skin: Skin color, texture, turgor normal. No rashes or lesions Neurologic: Grossly normal Psych: pleasant  EKG:  Sinus bradycardia at 58 -personally reviewed  ASSESSMENT: 1. Recent NSTEMI - s/p repeat PCI to the RCA 12/2016 - DES 2. Coronary artery disease status post PCI to the RCA in 2015 3. Dyslipidemia-at goal 4. Hypertension 5. DOE 6. Breast cancer   PLAN: Ms Slaven seems to be doing well unfortunately other than surgery recently for breast cancer.  She is undergoing radiation treatment.  She briefly interrupted her Effient and is back on the medication.  She will need to continue this at least until the end of March 2019.  Cholesterol is at goal.  Her blood pressure is well controlled.  She is asymptomatic otherwise at this time.  We will continue current medications and plan to see her back in 4 months.  Pixie Casino, MD, [facility], [place] Director of the [facility] Lipid [facility]  Cardiovascular [facility] Attending Cardiologist  Direct Dial: [phone]  Fax: [phone]  Website:  [email or web address]

## 2017-09-07 NOTE — Patient Instructions (Signed)
Your physician recommends that you schedule a follow-up appointment in [place] with Dr. Debara Pickett.

## 2017-09-07 NOTE — Telephone Encounter (Signed)
? #   not working?  only static noise, phone did not ring when # dialed/fim

## 2017-09-10 ENCOUNTER — Ambulatory Visit
Admission: RE | Admit: 2017-09-10 | Discharge: 2017-09-10 | Disposition: A | Discharge status: Home / Self Care | Payer: Federal, State, Local not specified - PPO | Source: Ambulatory Visit | Attending: Radiation Oncology | Admitting: Radiation Oncology

## 2017-09-10 ENCOUNTER — Other Ambulatory Visit: Payer: Self-pay | Admitting: Internal Medicine

## 2017-09-10 DIAGNOSIS — C50511 Malignant neoplasm of lower-outer quadrant of right female breast: Secondary | ICD-10-CM | POA: Diagnosis not present

## 2017-09-10 DIAGNOSIS — E785 Hyperlipidemia, unspecified: Secondary | ICD-10-CM

## 2017-09-11 ENCOUNTER — Ambulatory Visit
Admission: RE | Admit: 2017-09-11 | Discharge: 2017-09-11 | Disposition: A | Discharge status: Home / Self Care | Payer: Federal, State, Local not specified - PPO | Source: Ambulatory Visit | Attending: Radiation Oncology | Admitting: Radiation Oncology

## 2017-09-11 DIAGNOSIS — C50511 Malignant neoplasm of lower-outer quadrant of right female breast: Secondary | ICD-10-CM | POA: Diagnosis not present

## 2017-09-12 ENCOUNTER — Ambulatory Visit
Admission: RE | Admit: 2017-09-12 | Discharge: 2017-09-12 | Disposition: A | Discharge status: Home / Self Care | Payer: Federal, State, Local not specified - PPO | Source: Ambulatory Visit | Attending: Radiation Oncology | Admitting: Radiation Oncology

## 2017-09-12 DIAGNOSIS — C50511 Malignant neoplasm of lower-outer quadrant of right female breast: Secondary | ICD-10-CM | POA: Diagnosis not present

## 2017-09-13 ENCOUNTER — Ambulatory Visit
Admission: RE | Admit: 2017-09-13 | Discharge: 2017-09-13 | Disposition: A | Discharge status: Home / Self Care | Payer: Federal, State, Local not specified - PPO | Source: Ambulatory Visit | Attending: Radiation Oncology | Admitting: Radiation Oncology

## 2017-09-13 DIAGNOSIS — C50511 Malignant neoplasm of lower-outer quadrant of right female breast: Secondary | ICD-10-CM | POA: Diagnosis not present

## 2017-09-14 ENCOUNTER — Ambulatory Visit
Admission: RE | Admit: 2017-09-14 | Discharge: 2017-09-14 | Disposition: A | Discharge status: Home / Self Care | Payer: Federal, State, Local not specified - PPO | Source: Ambulatory Visit | Attending: Radiation Oncology | Admitting: Radiation Oncology

## 2017-09-14 ENCOUNTER — Ambulatory Visit (HOSPITAL_COMMUNITY): Payer: Self-pay | Admitting: Psychiatry

## 2017-09-14 ENCOUNTER — Ambulatory Visit: Payer: Federal, State, Local not specified - PPO | Admitting: Radiation Oncology

## 2017-09-14 DIAGNOSIS — C50511 Malignant neoplasm of lower-outer quadrant of right female breast: Secondary | ICD-10-CM | POA: Diagnosis not present

## 2017-09-17 ENCOUNTER — Ambulatory Visit: Payer: Federal, State, Local not specified - PPO

## 2017-09-17 ENCOUNTER — Ambulatory Visit (HOSPITAL_COMMUNITY): Payer: Self-pay | Admitting: Psychiatry

## 2017-09-18 ENCOUNTER — Ambulatory Visit (HOSPITAL_COMMUNITY): Payer: Self-pay | Admitting: Psychiatry

## 2017-09-18 ENCOUNTER — Ambulatory Visit: Payer: Federal, State, Local not specified - PPO

## 2017-09-18 ENCOUNTER — Ambulatory Visit
Admission: RE | Admit: 2017-09-18 | Discharge: 2017-09-18 | Disposition: A | Discharge status: Home / Self Care | Payer: Federal, State, Local not specified - PPO | Source: Ambulatory Visit | Attending: Radiation Oncology | Admitting: Radiation Oncology

## 2017-09-18 DIAGNOSIS — C50511 Malignant neoplasm of lower-outer quadrant of right female breast: Secondary | ICD-10-CM

## 2017-09-18 DIAGNOSIS — Z170 Estrogen receptor positive status [ER+]: Principal | ICD-10-CM

## 2017-09-18 MED ORDER — SILVER SULFADIAZINE 1 % EX CREA
TOPICAL_CREAM | Freq: Every day | CUTANEOUS | Status: DC
  Administered 2017-09-18: 16:00:00 via TOPICAL

## 2017-09-18 NOTE — Progress Notes (Signed)
Silvadene given moist desquamation under inframmary fold of breast,not allergic to sulfa, gave instructions of use, must wash off before reapplying ,wash hands before and after , to use daily after rad tx,nothing 4 hours before tx, must wash off before rad tx Patient gave teach back  4:31 PM

## 2017-09-19 ENCOUNTER — Ambulatory Visit: Payer: Federal, State, Local not specified - PPO

## 2017-09-19 ENCOUNTER — Ambulatory Visit
Admission: RE | Admit: 2017-09-19 | Discharge: 2017-09-19 | Disposition: A | Discharge status: Home / Self Care | Payer: Federal, State, Local not specified - PPO | Source: Ambulatory Visit | Attending: Radiation Oncology | Admitting: Radiation Oncology

## 2017-09-19 DIAGNOSIS — C50511 Malignant neoplasm of lower-outer quadrant of right female breast: Secondary | ICD-10-CM | POA: Diagnosis not present

## 2017-09-20 ENCOUNTER — Ambulatory Visit
Admission: RE | Admit: 2017-09-20 | Discharge: 2017-09-20 | Disposition: A | Discharge status: Home / Self Care | Payer: Federal, State, Local not specified - PPO | Source: Ambulatory Visit | Attending: Radiation Oncology | Admitting: Radiation Oncology

## 2017-09-20 DIAGNOSIS — C50511 Malignant neoplasm of lower-outer quadrant of right female breast: Secondary | ICD-10-CM | POA: Diagnosis not present

## 2017-09-21 ENCOUNTER — Ambulatory Visit: Payer: Federal, State, Local not specified - PPO

## 2017-09-21 ENCOUNTER — Ambulatory Visit
Admission: RE | Admit: 2017-09-21 | Discharge: 2017-09-21 | Disposition: A | Discharge status: Home / Self Care | Payer: Federal, State, Local not specified - PPO | Source: Ambulatory Visit | Attending: Radiation Oncology | Admitting: Radiation Oncology

## 2017-09-21 DIAGNOSIS — C50511 Malignant neoplasm of lower-outer quadrant of right female breast: Secondary | ICD-10-CM | POA: Diagnosis not present

## 2017-09-22 ENCOUNTER — Telehealth: Payer: Self-pay | Admitting: Hematology and Oncology

## 2017-09-22 NOTE — Telephone Encounter (Signed)
Scheduled appt per 12/13 sch message - patient is aware of appt date and time.

## 2017-09-24 ENCOUNTER — Ambulatory Visit
Admission: RE | Admit: 2017-09-24 | Discharge: 2017-09-24 | Disposition: A | Discharge status: Home / Self Care | Payer: Federal, State, Local not specified - PPO | Source: Ambulatory Visit | Attending: Radiation Oncology | Admitting: Radiation Oncology

## 2017-09-24 ENCOUNTER — Ambulatory Visit: Payer: Federal, State, Local not specified - PPO

## 2017-09-24 DIAGNOSIS — C50511 Malignant neoplasm of lower-outer quadrant of right female breast: Secondary | ICD-10-CM | POA: Diagnosis not present

## 2017-09-25 ENCOUNTER — Ambulatory Visit
Admission: RE | Admit: 2017-09-25 | Discharge: 2017-09-25 | Disposition: A | Discharge status: Home / Self Care | Payer: Federal, State, Local not specified - PPO | Source: Ambulatory Visit | Attending: Radiation Oncology | Admitting: Radiation Oncology

## 2017-09-25 DIAGNOSIS — C50511 Malignant neoplasm of lower-outer quadrant of right female breast: Secondary | ICD-10-CM | POA: Diagnosis not present

## 2017-09-26 ENCOUNTER — Encounter (HOSPITAL_COMMUNITY): Payer: Self-pay | Admitting: *Deleted

## 2017-09-26 ENCOUNTER — Telehealth: Payer: Self-pay | Admitting: Internal Medicine

## 2017-09-26 ENCOUNTER — Emergency Department (HOSPITAL_COMMUNITY): Payer: Federal, State, Local not specified - PPO

## 2017-09-26 ENCOUNTER — Observation Stay (HOSPITAL_COMMUNITY)
Admission: EM | Admit: 2017-09-26 | Discharge: 2017-09-27 | Disposition: A | Discharge status: Home / Self Care | Payer: Federal, State, Local not specified - PPO | Attending: Internal Medicine | Admitting: Internal Medicine

## 2017-09-26 ENCOUNTER — Other Ambulatory Visit: Payer: Self-pay

## 2017-09-26 DIAGNOSIS — D649 Anemia, unspecified: Secondary | ICD-10-CM | POA: Diagnosis present

## 2017-09-26 DIAGNOSIS — C50911 Malignant neoplasm of unspecified site of right female breast: Secondary | ICD-10-CM | POA: Diagnosis not present

## 2017-09-26 DIAGNOSIS — Z7982 Long term (current) use of aspirin: Secondary | ICD-10-CM | POA: Insufficient documentation

## 2017-09-26 DIAGNOSIS — Z79899 Other long term (current) drug therapy: Secondary | ICD-10-CM | POA: Diagnosis not present

## 2017-09-26 DIAGNOSIS — E785 Hyperlipidemia, unspecified: Secondary | ICD-10-CM | POA: Diagnosis present

## 2017-09-26 DIAGNOSIS — D75839 Thrombocytosis, unspecified: Secondary | ICD-10-CM

## 2017-09-26 DIAGNOSIS — I2510 Atherosclerotic heart disease of native coronary artery without angina pectoris: Secondary | ICD-10-CM | POA: Diagnosis not present

## 2017-09-26 DIAGNOSIS — R079 Chest pain, unspecified: Secondary | ICD-10-CM | POA: Diagnosis present

## 2017-09-26 DIAGNOSIS — R072 Precordial pain: Secondary | ICD-10-CM | POA: Diagnosis not present

## 2017-09-26 DIAGNOSIS — R0789 Other chest pain: Secondary | ICD-10-CM | POA: Diagnosis present

## 2017-09-26 DIAGNOSIS — I129 Hypertensive chronic kidney disease with stage 1 through stage 4 chronic kidney disease, or unspecified chronic kidney disease: Secondary | ICD-10-CM | POA: Insufficient documentation

## 2017-09-26 DIAGNOSIS — I421 Obstructive hypertrophic cardiomyopathy: Secondary | ICD-10-CM | POA: Diagnosis not present

## 2017-09-26 DIAGNOSIS — D473 Essential (hemorrhagic) thrombocythemia: Secondary | ICD-10-CM | POA: Diagnosis not present

## 2017-09-26 DIAGNOSIS — R0602 Shortness of breath: Secondary | ICD-10-CM | POA: Diagnosis not present

## 2017-09-26 DIAGNOSIS — Z9889 Other specified postprocedural states: Secondary | ICD-10-CM

## 2017-09-26 DIAGNOSIS — I252 Old myocardial infarction: Secondary | ICD-10-CM | POA: Diagnosis not present

## 2017-09-26 DIAGNOSIS — Z87891 Personal history of nicotine dependence: Secondary | ICD-10-CM | POA: Insufficient documentation

## 2017-09-26 DIAGNOSIS — N182 Chronic kidney disease, stage 2 (mild): Secondary | ICD-10-CM | POA: Diagnosis not present

## 2017-09-26 DIAGNOSIS — N189 Chronic kidney disease, unspecified: Secondary | ICD-10-CM | POA: Diagnosis present

## 2017-09-26 DIAGNOSIS — I10 Essential (primary) hypertension: Secondary | ICD-10-CM | POA: Diagnosis present

## 2017-09-26 LAB — TROPONIN I: Troponin I: <0.03 ng/mL (ref <0.03)

## 2017-09-26 LAB — BASIC METABOLIC PANEL
Anion gap: 9 (ref 5–15)
BUN: 11 mg/dL (ref 6–20)
CO2: 20 mmol/L — ABNORMAL LOW (ref 22–32)
Calcium: 9.1 mg/dL (ref 8.9–10.3)
Chloride: 103 mmol/L (ref 101–111)
Creatinine, Ser: 1.09 mg/dL — ABNORMAL HIGH (ref 0.44–1.00)
GFR calc Af Amer: >60 mL/min (ref >60)
GFR calc non Af Amer: >60 mL/min (ref >60)
Glucose, Bld: 88 mg/dL (ref 65–99)
Potassium: 3.5 mmol/L (ref 3.5–5.1)
Sodium: 132 mmol/L — ABNORMAL LOW (ref 135–145)

## 2017-09-26 LAB — HEPATIC FUNCTION PANEL
ALT: 15 U/L (ref 14–54)
AST: 22 U/L (ref 15–41)
Albumin: 3.4 g/dL — ABNORMAL LOW (ref 3.5–5.0)
Alkaline Phosphatase: 49 U/L (ref 38–126)
Bilirubin, Direct: <0.1 mg/dL — ABNORMAL LOW (ref 0.1–0.5)
Total Bilirubin: 0.5 mg/dL (ref 0.3–1.2)
Total Protein: 6.3 g/dL — ABNORMAL LOW (ref 6.5–8.1)

## 2017-09-26 LAB — CBC
HCT: 31.3 % — ABNORMAL LOW (ref 36.0–46.0)
Hemoglobin: 10.2 g/dL — ABNORMAL LOW (ref 12.0–15.0)
MCH: 26.2 pg (ref 26.0–34.0)
MCHC: 32.6 g/dL (ref 30.0–36.0)
MCV: 80.3 fL (ref 78.0–100.0)
Platelets: 475 10*3/uL — ABNORMAL HIGH (ref 150–400)
RBC: 3.9 MIL/uL (ref 3.87–5.11)
RDW: 18.1 % — ABNORMAL HIGH (ref 11.5–15.5)
WBC: 11.8 10*3/uL — ABNORMAL HIGH (ref 4.0–10.5)

## 2017-09-26 LAB — I-STAT TROPONIN, ED
Troponin i, poc: 0 ng/mL (ref 0.00–0.08)
Troponin i, poc: 0 ng/mL (ref 0.00–0.08)

## 2017-09-26 LAB — D-DIMER, QUANTITATIVE: D-Dimer, Quant: 0.36 ug/mL-FEU (ref 0.00–0.50)

## 2017-09-26 LAB — I-STAT BETA HCG BLOOD, ED (MC, WL, AP ONLY): I-stat hCG, quantitative: <5 m[IU]/mL (ref <5)

## 2017-09-26 LAB — BRAIN NATRIURETIC PEPTIDE: B Natriuretic Peptide: 37.2 pg/mL (ref 0.0–100.0)

## 2017-09-26 MED ORDER — TAMOXIFEN CITRATE 10 MG PO TABS
20.0000 mg | ORAL_TABLET | Freq: Every day | ORAL | Status: DC
  Administered 2017-09-27: 20 mg via ORAL
  Filled 2017-09-26: qty 2

## 2017-09-26 MED ORDER — NITROGLYCERIN 0.4 MG SL SUBL
0.4000 mg | SUBLINGUAL_TABLET | SUBLINGUAL | Status: DC | PRN
  Administered 2017-09-26 (×2): 0.4 mg via SUBLINGUAL
  Filled 2017-09-26: qty 1

## 2017-09-26 MED ORDER — ARIPIPRAZOLE 10 MG PO TABS
10.0000 mg | ORAL_TABLET | Freq: Every day | ORAL | Status: DC
  Administered 2017-09-27: 10 mg via ORAL
  Filled 2017-09-26 (×2): qty 1

## 2017-09-26 MED ORDER — LISINOPRIL 20 MG PO TABS
20.0000 mg | ORAL_TABLET | Freq: Two times a day (BID) | ORAL | Status: DC
  Administered 2017-09-26 – 2017-09-27 (×2): 20 mg via ORAL
  Filled 2017-09-26 (×2): qty 1

## 2017-09-26 MED ORDER — AMLODIPINE BESYLATE 5 MG PO TABS
5.0000 mg | ORAL_TABLET | Freq: Every day | ORAL | Status: DC
  Administered 2017-09-27: 5 mg via ORAL
  Filled 2017-09-26 (×2): qty 1

## 2017-09-26 MED ORDER — HYDROXYZINE HCL 25 MG PO TABS
25.0000 mg | ORAL_TABLET | Freq: Four times a day (QID) | ORAL | Status: DC | PRN
Start: 2017-09-26 — End: 2017-09-27

## 2017-09-26 MED ORDER — ACETAMINOPHEN 325 MG PO TABS: 325.0000 mg | ORAL_TABLET | Freq: Three times a day (TID) | ORAL | Status: DC | PRN

## 2017-09-26 MED ORDER — HYDROXYZINE HCL 25 MG PO TABS
25.0000 mg | ORAL_TABLET | Freq: Four times a day (QID) | ORAL | Status: DC | PRN
Start: 2017-09-26 — End: 2017-09-26

## 2017-09-26 MED ORDER — CLONAZEPAM 0.5 MG PO TABS
0.5000 mg | ORAL_TABLET | Freq: Every evening | ORAL | Status: DC
  Administered 2017-09-26: 0.5 mg via ORAL
  Filled 2017-09-26: qty 1

## 2017-09-26 MED ORDER — GABAPENTIN 300 MG PO CAPS
300.0000 mg | ORAL_CAPSULE | Freq: Two times a day (BID) | ORAL | Status: DC
  Administered 2017-09-26 – 2017-09-27 (×2): 300 mg via ORAL
  Filled 2017-09-26 (×2): qty 1

## 2017-09-26 MED ORDER — METOPROLOL TARTRATE 25 MG PO TABS
25.0000 mg | ORAL_TABLET | Freq: Two times a day (BID) | ORAL | Status: DC
  Administered 2017-09-26 – 2017-09-27 (×2): 25 mg via ORAL
  Filled 2017-09-26 (×2): qty 1

## 2017-09-26 MED ORDER — PANTOPRAZOLE SODIUM 40 MG PO TBEC
40.0000 mg | DELAYED_RELEASE_TABLET | Freq: Two times a day (BID) | ORAL | Status: DC
  Administered 2017-09-26 – 2017-09-27 (×2): 40 mg via ORAL
  Filled 2017-09-26 (×2): qty 1

## 2017-09-26 MED ORDER — ONDANSETRON HCL 4 MG/2ML IJ SOLN: 4.0000 mg | Freq: Four times a day (QID) | INTRAMUSCULAR | Status: DC | PRN

## 2017-09-26 MED ORDER — ASPIRIN 81 MG PO CHEW
81.0000 mg | CHEWABLE_TABLET | Freq: Every day | ORAL | Status: DC
  Administered 2017-09-27: 81 mg via ORAL
  Filled 2017-09-26 (×2): qty 1

## 2017-09-26 MED ORDER — HYDROXYZINE PAMOATE 25 MG PO CAPS: 25.0000 mg | ORAL_CAPSULE | Freq: Every evening | ORAL | Status: DC | PRN

## 2017-09-26 MED ORDER — FOLIC ACID 1 MG PO TABS
1.0000 mg | ORAL_TABLET | Freq: Every day | ORAL | Status: DC
  Administered 2017-09-26 – 2017-09-27 (×2): 1 mg via ORAL
  Filled 2017-09-26 (×2): qty 1

## 2017-09-26 MED ORDER — GI COCKTAIL ~~LOC~~
30.0000 mL | Freq: Once | ORAL | Status: AC
  Administered 2017-09-26: 30 mL via ORAL
  Filled 2017-09-26: qty 30

## 2017-09-26 MED ORDER — TRAZODONE HCL 50 MG PO TABS: 50.0000 mg | ORAL_TABLET | Freq: Every evening | ORAL | Status: DC | PRN

## 2017-09-26 MED ORDER — PRASUGREL HCL 10 MG PO TABS
10.0000 mg | ORAL_TABLET | Freq: Every day | ORAL | Status: DC
Start: 2017-09-27 — End: 2017-09-27
  Administered 2017-09-27: 10 mg via ORAL
  Filled 2017-09-26: qty 1

## 2017-09-26 MED ORDER — MORPHINE SULFATE (PF) 4 MG/ML IV SOLN
4.0000 mg | Freq: Once | INTRAVENOUS | Status: AC
  Administered 2017-09-26: 4 mg via INTRAVENOUS
  Filled 2017-09-26: qty 1

## 2017-09-26 MED ORDER — ACETAMINOPHEN 325 MG PO TABS: 650.0000 mg | ORAL_TABLET | ORAL | Status: DC | PRN

## 2017-09-26 MED ORDER — NITROGLYCERIN 0.4 MG SL SUBL
0.4000 mg | SUBLINGUAL_TABLET | SUBLINGUAL | Status: DC | PRN
  Administered 2017-09-26: 0.4 mg via SUBLINGUAL
  Filled 2017-09-26: qty 1

## 2017-09-26 MED ORDER — ATORVASTATIN CALCIUM 80 MG PO TABS
80.0000 mg | ORAL_TABLET | Freq: Every day | ORAL | Status: DC
  Administered 2017-09-26: 80 mg via ORAL
  Filled 2017-09-26: qty 1

## 2017-09-26 NOTE — ED Notes (Signed)
Per EDP, pt able to eat.

## 2017-09-26 NOTE — ED Notes (Signed)
Mercedes, PA at the bedside.  

## 2017-09-26 NOTE — ED Notes (Signed)
ED Provider at bedside. 

## 2017-09-26 NOTE — ED Triage Notes (Signed)
Pt states for last 2 days started having some chest pains to mid chest intermittently and now it is more steady and stabbing pains.  Recent MI in March 2018 with similar symptoms

## 2017-09-26 NOTE — ED Notes (Signed)
Admitting at the bedside.  

## 2017-09-26 NOTE — ED Notes (Signed)
Phlebotomy at bedside.

## 2017-09-26 NOTE — ED Notes (Addendum)
Per Jeneen Rinks in pharmacy, will reschedule medications as appropriate before administration. Will hold the remaining admitting medications until rescheduled properly by pharmacy

## 2017-09-26 NOTE — H&P (Signed)
History and Physical    Anna Henson XYV:[id number] DOB: 1971/10/09 DOA: 09/26/2017  PCP: Wardell Honour, MD  Patient coming from:  home  Chief Complaint:  Chest pain  HPI: Anna Henson is a 46 y.o. female with medical history significant of CAD /STEMI/NSTEMI with multiple stents comes in with sscp that radiates to right upper back which feels similar to previous heart attack but not as severe.  Her last cath was in march 2018 which her RCA needed stented.  She could not tolerate plavix due to her medication for her breast cancer and has been on effient.  She is compliant with her meds.  She denies any cough, fevers, n/v/d.  No le edema or swelling.  Pt has had 2 normal troponins in the ED.  Cardiology team was called dr Teena Dunk who advised a stress test in the am.  Review of Systems: As per HPI otherwise 10 point review of systems negative.   Past Medical History:  Diagnosis Date  . Anxiety   . Bipolar disorder ([place])   . Breast cancer, right breast ([place]) ~ 03/2017  . CAD in native artery    a. 09/2014 Cath/PCI in setting of [place] - s/p 2.25 x 12 mm Promus Premier DES to the RCA;  b. 11/2016 Myoview: EF 56%, no ischemia;  c. 12/2016 NSTEMI/PCI: LM nl, LAd 25p, LCX 30p, RCA 100p (2.75x38 Promus Premier DES), mRCA 10 ISR, EF 50-55%.  . Chronic kidney disease    she sthinks has stage 2 CKD, has had US doppler and has some stenosis , this was done in [place]   . CKD (chronic kidney disease), stage II   . Depression   . Dysrhythmia    Tachycardia in evenings  . Genetic testing 04/23/2017   Ms. Iannaccone underwent genetic counseling and testing for hereditary cancer syndromes on 04/05/2017. Her results were negative for mutations in all 46 genes analyzed by [facility]'s 46-gene Common Hereditary Cancers Panel. Genes analyzed include: APC, ATM, AXIN2, BARD1, BMPR1A, BRCA1, BRCA2, BRIP1, CDH1, CDKN2A, CHEK2, CTNNA1, DICER1, EPCAM, GREM1, HOXB13, KIT, MEN1, MLH1, MSH2, MSH3, MSH6, MUTYH, NBN,  . GERD  (gastroesophageal reflux disease)   . Headache    "bi-weekly" (05/07/2017)  . Heart attack ([place]) 12/31/2016  . HOCM (hypertrophic obstructive cardiomyopathy) ([place])    a. 12/2016 Echo: EF 65-70%,  mod conc LVH, dynamic obstruction @ rest, peak velocity of 291 cm/sec w/ peak gradient of 66mHg, no rwma, Gr1 DD, triv TR, PASP 171mg.  . Marland Kitchenyperlipidemia   . Hypertension    > 5 years  . Migraine    "probably 1/month" (05/07/2017)  . Palpitations   . Pneumonia    "twice when I was a child" (05/07/2017)  . Tobacco abuse 09/13/2014  . Transverse myelitis ([facility]    Past Surgical History:  Procedure Laterality Date  . ABDOMINAL HERNIA REPAIR  ~ 2001; ~ 2005  . BREAST BIOPSY Right ~ 03/2017 X 2  . BREAST LUMPECTOMY WITH RADIOACTIVE SEED AND SENTINEL LYMPH NODE BIOPSY Right 06/21/2017   Procedure: RIGHT BREAST BRACKETED SEED GUIDED LUMPECTOMY AND SENTINEL LYMPH NODE BIOPSY;  Surgeon: ToJovita KussmaulMD;  [facility] Service: General;  Laterality: Right;  . CORONARY ANGIOPLASTY WITH STENT PLACEMENT     L main OK, LAD OK, CFX 30%, RCA 90%-0% w/ 2.25 x 12 mm Promus Premier DES, EF 55%  . CORONARY/GRAFT ACUTE MI REVASCULARIZATION N/A 12/31/2016   Procedure: Coronary/Graft Acute MI Revascularization;  Surgeon: MiSherren MochaMD;  Location: [facility];  Service: Cardiovascular;  Laterality: N/A;  . HERNIA REPAIR     times 2  umbilical  . LEFT HEART CATH AND CORONARY ANGIOGRAPHY N/A 12/31/2016   Procedure: Left Heart Cath and Coronary Angiography;  Surgeon: Sherren Mocha, MD;  Location: [facility];  Service: Cardiovascular;  Laterality: N/A;  . LEFT HEART CATHETERIZATION WITH CORONARY ANGIOGRAM N/A 09/14/2014   Procedure: LEFT HEART CATHETERIZATION WITH CORONARY ANGIOGRAM;  Surgeon: Burnell Blanks, MD;  Location[facility];  Service: Cardiovascular;  Laterality: N/A;  . PERCUTANEOUS CORONARY STENT INTERVENTION (PCI-S)  09/14/2014   Procedure: PERCUTANEOUS CORONARY STENT  INTERVENTION (PCI-S);  Surgeon: Burnell Blanks, MD;  Location: [facility] CATH LAB;  Service: Cardiovascular;;  . TONSILLECTOMY  2005     reports that she quit smoking about 1 years ago. Her smoking use included cigarettes. She has a 15.00 pack-year smoking history. she has never used smokeless tobacco. She reports that she drinks about 1.2 oz of alcohol per week. She reports that she does not use drugs.  Allergies  Allergen Reactions  . Pork-Derived Products Other (See Comments)    Does NOT eat pork    Family History  Problem Relation Age of Onset  . Diabetes Mother   . Heart disease Mother   . Hyperlipidemia Mother   . Hypertension Mother   . Lung cancer Father   . Drug abuse Father   . Brain cancer Father 40  . Bone cancer Father 66  . Drug abuse Sister   . Mental illness Brother   . Mental illness Maternal Grandmother   . Stomach cancer Paternal Grandmother 18       d.75    Prior to Admission medications   Medication Sig Start Date End Date Taking? Authorizing Provider  acetaminophen (TYLENOL) 325 MG tablet Take 325-650 mg by mouth every 8 (eight) hours as needed (for pain or cramping).    Yes [provider]  amLODipine (NORVASC) 5 MG tablet Take 1 tablet (5 mg total) by mouth daily. 06/17/17 06/17/18 Yes Isaiah Serge, NP  ARIPiprazole (ABILIFY) 10 MG tablet Take 1 tablet (10 mg total) by mouth daily. For mood control Patient taking differently: Take 10 mg by mouth daily.  09/01/17  Yes Money, Lowry Ram, FNP  aspirin 81 MG chewable tablet Chew 1 tablet (81 mg total) by mouth daily. 09/16/14  Yes Nita Sells, MD  atorvastatin (LIPITOR) 80 MG tablet TAKE 1 TABLET(80 MG) BY MOUTH DAILY Patient taking differently: Take 80 mg by mouth in the evening 09/10/17  Yes Hilty, Nadean Corwin, MD  baclofen (LIORESAL) 10 MG tablet Take up to 4/day for muscle spasms Patient taking differently: Take 10-20 mg by mouth See admin instructions. 10 mg in the morning then 10 mg in the  afternoon then 20 mg at bedtime 03/12/17  Yes Sater, Nanine Means, MD  clonazePAM (KLONOPIN) 1 MG tablet Take 0.5 mg by mouth every evening. 08/23/17  Yes [provider]  folic acid (FOLVITE) 1 MG tablet Take 1 mg by mouth daily.   Yes [provider]  furosemide (LASIX) 20 MG tablet TAKE 1 TABLET(20 MG) BY MOUTH DAILY AS NEEDED Patient taking differently: Take 20 mg by mouth once a day as needed for obvious swelling 05/28/17  Yes Hilty, Nadean Corwin, MD  gabapentin (NEURONTIN) 300 MG capsule Take 1 capsule (300 mg total) by mouth 2 (two) times daily. 08/31/17  Yes Money, Lowry Ram, FNP  hydrOXYzine (ATARAX/VISTARIL) 25 MG  tablet Take 1 tablet (25 mg total) by mouth every 6 (six) hours as needed for anxiety. 08/31/17  Yes Money, Lowry Ram, FNP  lisinopril (PRINIVIL,ZESTRIL) 20 MG tablet Take 1 tablet (20 mg total) by mouth 2 (two) times daily. 08/03/17 11/01/17 Yes Hilty, Nadean Corwin, MD  metoprolol tartrate (LOPRESSOR) 50 MG tablet Take 25 mg by mouth 2 (two) times daily. 07/31/17  Yes [provider]  nitroGLYCERIN (NITROSTAT) 0.4 MG SL tablet Place 1 tablet (0.4 mg total) under the tongue every 5 (five) minutes as needed for chest pain. 10/20/16 09/26/17 Yes Strader, Fransisco Hertz, PA-C  pantoprazole (PROTONIX) 40 MG tablet Take 1 tablet (40 mg total) by mouth 2 (two) times daily. 01/02/17  Yes Carlota Raspberry, Tiffany, PA-C  prasugrel (EFFIENT) 10 MG TABS tablet Take 1 tablet (10 mg total) by mouth daily. 09/07/17  Yes Hilty, Nadean Corwin, MD  sertraline (ZOLOFT) 50 MG tablet Take 1 tablet (50 mg total) by mouth daily. For mood control Patient taking differently: Take 50 mg by mouth daily.  09/01/17  Yes Money, Lowry Ram, FNP  silver sulfADIAZINE (SILVADENE) 1 % cream Apply 1 application topically daily as needed (as directed/for irritation).  09/18/17  Yes [provider]  tamoxifen (NOLVADEX) 20 MG tablet Take 1 tablet (20 mg total) by mouth daily. 04/04/17  Yes Nicholas Lose, MD  traZODone  (DESYREL) 50 MG tablet Take 1 tablet (50 mg total) by mouth at bedtime as needed for sleep. 08/31/17  Yes Money, Lowry Ram, FNP  hydrOXYzine (VISTARIL) 25 MG capsule Take 25-50 mg by mouth at bedtime as needed (for anxiety or sleep).  08/16/17   [provider]    Physical Exam: Vitals:   09/26/17 1945 09/26/17 2000 09/26/17 2015 09/26/17 2045  BP: (!) 161/94 (!) 166/91 (!) 175/95 (!) 154/86  Pulse: 61 (!) 58 (!) 58 60  Resp: '13 15 12 14  ' Temp:      TempSrc:      SpO2: 99% 100% 100% 99%  Weight:      Height:          Constitutional: NAD, calm, comfortable Vitals:   09/26/17 1945 09/26/17 2000 09/26/17 2015 09/26/17 2045  BP: (!) 161/94 (!) 166/91 (!) 175/95 (!) 154/86  Pulse: 61 (!) 58 (!) 58 60  Resp: '13 15 12 14  ' Temp:      TempSrc:      SpO2: 99% 100% 100% 99%  Weight:      Height:       Eyes: PERRL, lids and conjunctivae normal ENMT: Mucous membranes are moist. Posterior pharynx clear of any exudate or lesions.Normal dentition.  Neck: normal, supple, no masses, no thyromegaly Respiratory: clear to auscultation bilaterally, no wheezing, no crackles. Normal respiratory effort. No accessory muscle use.  Cardiovascular: Regular rate and rhythm, no murmurs / rubs / gallops. No extremity edema. 2+ pedal pulses. No carotid bruits.  Abdomen: no tenderness, no masses palpated. No hepatosplenomegaly. Bowel sounds positive.  Musculoskeletal: no clubbing / cyanosis. No joint deformity upper and lower extremities. Good ROM, no contractures. Normal muscle tone.  Skin: no rashes, lesions, ulcers. No induration Neurologic: CN 2-12 grossly intact. Sensation intact, DTR normal. Strength 5/5 in all 4.  Psychiatric: Normal judgment and insight. Alert and oriented x 3. Normal mood.    Labs on Admission: I have personally reviewed following labs and imaging studies  CBC: Recent Labs  Lab 09/26/17 1333  WBC 11.8*  HGB 10.2*  HCT 31.3*  MCV 80.3  PLT 475*  Basic Metabolic  Panel: Recent Labs  Lab 09/26/17 1333  NA 132*  K 3.5  CL 103  CO2 20*  GLUCOSE 88  BUN 11  CREATININE 1.09*  CALCIUM 9.1   GFR: Estimated Creatinine Clearance: 57.1 mL/min (A) (by C-G formula based on SCr of 1.09 mg/dL (H)). Liver Function Tests: Recent Labs  Lab 09/26/17 1810  AST 22  ALT 15  ALKPHOS 49  BILITOT 0.5  PROT 6.3*  ALBUMIN 3.4*   No results for input(s): LIPASE, AMYLASE in the last 168 hours. No results for input(s): AMMONIA in the last 168 hours. Coagulation Profile: No results for input(s): INR, PROTIME in the last 168 hours. Cardiac Enzymes: No results for input(s): CKTOTAL, CKMB, CKMBINDEX, TROPONINI in the last 168 hours. BNP (last 3 results) No results for input(s): PROBNP in the last 8760 hours. HbA1C: No results for input(s): HGBA1C in the last 72 hours. CBG: No results for input(s): GLUCAP in the last 168 hours. Lipid Profile: No results for input(s): CHOL, HDL, LDLCALC, TRIG, CHOLHDL, LDLDIRECT in the last 72 hours. Thyroid Function Tests: No results for input(s): TSH, T4TOTAL, FREET4, T3FREE, THYROIDAB in the last 72 hours. Anemia Panel: No results for input(s): VITAMINB12, FOLATE, FERRITIN, TIBC, IRON, RETICCTPCT in the last 72 hours. Urine analysis:    Component Value Date/Time   COLORURINE YELLOW 05/17/2017 0238   APPEARANCEUR CLEAR 05/17/2017 0238   LABSPEC 1.017 05/17/2017 0238   PHURINE 5.0 05/17/2017 0238   GLUCOSEU NEGATIVE 05/17/2017 0238   HGBUR NEGATIVE 05/17/2017 0238   BILIRUBINUR NEGATIVE 05/17/2017 0238   BILIRUBINUR negative 11/29/2016 1703   BILIRUBINUR neg 01/16/2015 0940   KETONESUR 5 (A) 05/17/2017 0238   PROTEINUR 30 (A) 05/17/2017 0238   UROBILINOGEN 0.2 11/29/2016 1703   UROBILINOGEN 1.0 09/13/2014 1529   NITRITE NEGATIVE 05/17/2017 0238   LEUKOCYTESUR NEGATIVE 05/17/2017 0238   Sepsis Labs: !!!!!!!!!!!!!!!!!!!!!!!!!!!!!!!!!!!!!!!!!!!! '@LABRCNTIP' (procalcitonin:4,lacticidven:4) )No results found for this  or any previous visit (from the past 240 hour(s)).   Radiological Exams on Admission: Dg Chest 2 View  Result Date: 09/26/2017 CLINICAL DATA:  Intermittent mid chest pain for 2 days, stabbing sensation. History of myocardial infarction and breast cancer. EXAM: CHEST  2 VIEW COMPARISON:  Chest radiograph May 16, 2017 FINDINGS: Cardiomediastinal silhouette is normal. Coronary artery stent. No pleural effusions or focal consolidations. Trachea projects midline and there is no pneumothorax. Soft tissue planes and included osseous structures are non-suspicious. Surgical clips RIGHT breast. IMPRESSION: No acute cardiopulmonary process. Electronically Signed   By: Elon Alas M.D.   On: 09/26/2017 14:02    EKG: Independently reviewed. nsr no acute issues Old chart reviewed Case discussed with edp   Assessment/Plan 46 yo female with significant cardiac history comes in with chest pain  Principal Problem:   Chest pain- serial trop.  Cardiology to do stress in am per report from [address] who spoke to dr Teena Dunk.  Keep npo after midnight.    Active Problems:   Essential hypertension- cont home meds   HLD (hyperlipidemia)- cont statin   CAD (coronary artery disease), native coronary artery- noted   Chronic kidney disease- sable   S/P cardiac cath (12/2016) a. acute total occlusion of the RCA tx w/PCI DES, b. mild nonobs LAD/LCx stenosis, c. mild segmental LV sys dx w/ sev hypokinesis, LVEF 50-55%- noted   HOCM (hypertrophic obstructive cardiomyopathy) ([place])- noted   Chronic anemia- stable    DVT prophylaxis: scds Code Status:  full Family Communication:  none Disposition Plan:  Per day  team Consults called:  cardiology Admission status:  observation   Haniah Penny A MD [facility]  If 7PM-7AM, please contact night-coverage [email or web address] Password [email or web address]  09/26/2017, 9:02 PM

## 2017-09-26 NOTE — ED Provider Notes (Signed)
[facility] EMERGENCY DEPARTMENT Provider Note   CSN: [id number] Arrival date & time: 09/26/17  1321     History   Chief Complaint Chief Complaint  Patient presents with  . Chest Pain    HPI Anna Henson is a 46 y.o. female with a PMHx of HOCM, grade 1 diastolic dysfunction (echo 12/2016 with EF 65-70%), CAD/STEMI/NSTEMI s/p stent in 09/2014 and DES to RCA in 12/2016, HTN, HLD, CKD, transverse myelitis, depression/bipolar disorder, headaches, and R sided breast cancer s/p lumpectomy currently undergoing adjuvant radiation which she just finished yesterday (oncologist Dr. Lindi Adie; no chemo), who presents to the ED with complaints of gradual onset gradually worsening chest pain x 2 days which was initially intermittent until last night around 8 PM when it became more severe and more constant.  Patient states that she was at rest when the chest pain started, as well as when the chest pain got worse last night.  She describes the pain as 6/10 constant sharp and stabbing central chest pain that radiates into the right upper back, with no known aggravating factors, unchanged with exertion or inspiration, and minimally improved with nitroglycerin that she took around 3:30 PM while waiting in the lobby.  She reports associated "a little bit of shortness of breath when she talks for a while".  States she feels somewhat like she did back in march with her prior NSTEMI.  She is a former smoker, she quit in 2016.  Her PCP is Dr. Reginia Forts, and her cardiologist is Dr. Debara Pickett.  Of note, pt had NSTEMI in 12/2016, underwent cath which revealed complete occlusion of the RCA, DES was placed.  She has a family history of CHF and MI/CAD in her mother as well as in multiple members of her mother's mother's side of the family.  She denies diaphoresis, lightheadedness, fevers, chills, cough, LE swelling, recent travel/surgery/immobilization, estrogen use, personal/family hx of DVT/PE, abd pain,  N/V/D/C, hematuria, dysuria, myalgias, arthralgias, claudication, orthopnea, numbness, tingling, focal weakness, or any other complaints at this time.   The history is provided by the patient and medical records. No language interpreter was used.  Chest Pain   This is a new problem. The current episode started 2 days ago. The problem occurs constantly. The problem has been gradually worsening. The pain is associated with rest. The pain is present in the substernal region. The pain is at a severity of 6/10. The pain is moderate. The quality of the pain is described as stabbing and sharp. The pain radiates to the upper back. Duration of episode(s) is 2 days. Exacerbated by: nothing. Associated symptoms include shortness of breath ("a little bit with talking"). Pertinent negatives include no abdominal pain, no claudication, no cough, no diaphoresis, no fever, no lower extremity edema, no nausea, no numbness, no orthopnea, no vomiting and no weakness. She has tried nitroglycerin for the symptoms. The treatment provided mild relief.  Her past medical history is significant for CAD, cancer, hyperlipidemia, hypertension and MI.  Pertinent negatives for past medical history include no diabetes, no DVT and no PE.  Her family medical history is significant for CAD.  Pertinent negatives for family medical history include: no PE.  Procedure history is positive for cardiac catheterization, echocardiogram and stress thallium.    Past Medical History:  Diagnosis Date  . Anxiety   . Bipolar disorder ([place])   . Breast cancer, right breast ([place]) ~ 03/2017  . CAD in native artery    a. 09/2014 Cath/PCI in  setting of [place] - s/p 2.25 x 12 mm Promus Premier DES to the RCA;  b. 11/2016 Myoview: EF 56%, no ischemia;  c. 12/2016 NSTEMI/PCI: LM nl, LAd 25p, LCX 30p, RCA 100p (2.75x38 Promus Premier DES), mRCA 10 ISR, EF 50-55%.  . Chronic kidney disease    she sthinks has stage 2 CKD, has had US doppler and has some stenosis  , this was done in [place]   . CKD (chronic kidney disease), stage II   . Depression   . Dysrhythmia    Tachycardia in evenings  . Genetic testing 04/23/2017   Ms. Righter underwent genetic counseling and testing for hereditary cancer syndromes on 04/05/2017. Her results were negative for mutations in all 46 genes analyzed by [facility]'s 46-gene Common Hereditary Cancers Panel. Genes analyzed include: APC, ATM, AXIN2, BARD1, BMPR1A, BRCA1, BRCA2, BRIP1, CDH1, CDKN2A, CHEK2, CTNNA1, DICER1, EPCAM, GREM1, HOXB13, KIT, MEN1, MLH1, MSH2, MSH3, MSH6, MUTYH, NBN,  . GERD (gastroesophageal reflux disease)   . Headache    "bi-weekly" (05/07/2017)  . Heart attack ([place]) 12/31/2016  . HOCM (hypertrophic obstructive cardiomyopathy) ([place])    a. 12/2016 Echo: EF 65-70%,  mod conc LVH, dynamic obstruction @ rest, peak velocity of 291 cm/sec w/ peak gradient of 22mHg, no rwma, Gr1 DD, triv TR, PASP 140mg.  . Marland Kitchenyperlipidemia   . Hypertension    > 5 years  . Migraine    "probably 1/month" (05/07/2017)  . Palpitations   . Pneumonia    "twice when I was a child" (05/07/2017)  . Tobacco abuse 09/13/2014  . Transverse myelitis (HCLynn[place]2017    Patient Active Problem List   Diagnosis Date Noted  . MDD (major depressive disorder), recurrent episode, severe (HCAtlantic Beach11/20/2018  . Neck stiffness 08/22/2017  . Abnormal finding on MRI of brain 08/22/2017  . HOCM (hypertrophic obstructive cardiomyopathy) (HCFort Wright  . Chest pain 05/06/2017  . Genetic testing 04/23/2017  . Malignant neoplasm of lower-outer quadrant of right breast of female, estrogen receptor positive (HCHawk Springs06/21/2018  . Dysesthesia 03/12/2017  . Palpitations 02/23/2017  . S/P cardiac cath (12/2016) a. acute total occlusion of the RCA tx w/PCI DES, b. mild nonobs LAD/LCx stenosis, c. mild segmental LV sys dx w/ sev hypokinesis, LVEF 50-55% 01/02/2017  . Non-ST elevation myocardial infarction (NSTEMI), subsequent episode of care (HCRichards03/25/2018  . Malignant  hypertension   . Chronic kidney disease   . ST elevation myocardial infarction involving right coronary artery (HCFreeburg  . Abnormal EKG   . Nausea   . Ischemic cardiomyopathy   . Muscle spasms of both lower extremities 04/17/2016  . Gait disturbance 01/25/2016  . Transverse myelitis (HCSidon03/04/2016  . Acute-on-chronic kidney injury (HCMoorefield Station02/12/2015  . Neck pain 11/12/2015  . Hypokalemia 11/12/2015  . Abnormal MRI, neck 11/12/2015  . Numbness 11/12/2015  . Hypertension   . Hyperlipidemia   . Coronary artery disease involving native coronary artery of native heart without angina pectoris   . Facial numbness   . Right arm numbness   . Throat pain 11/09/2015  . Drooling 11/09/2015  . CAD (coronary artery disease), native coronary artery 09/15/2014  . Headache 09/15/2014  . UTI (urinary tract infection): Probable 09/14/2014  . Candida infection of genital region 09/14/2014  . Unstable angina (HCRoseland12/03/2014  . HLD (hyperlipidemia) 09/13/2014  . Otitis 09/13/2014  . ACS (acute coronary syndrome) (HCTunica Resorts12/03/2014  . Tobacco abuse 09/13/2014  . Essential hypertension 09/09/2014    Past Surgical History:  Procedure Laterality  Date  . ABDOMINAL HERNIA REPAIR  ~ 2001; ~ 2005  . BREAST BIOPSY Right ~ 03/2017 X 2  . BREAST LUMPECTOMY WITH RADIOACTIVE SEED AND SENTINEL LYMPH NODE BIOPSY Right 06/21/2017   Procedure: RIGHT BREAST BRACKETED SEED GUIDED LUMPECTOMY AND SENTINEL LYMPH NODE BIOPSY;  Surgeon: Jovita Kussmaul, MD;  Location: [facility];  Service: General;  Laterality: Right;  . CORONARY ANGIOPLASTY WITH STENT PLACEMENT     L main OK, LAD OK, CFX 30%, RCA 90%-0% w/ 2.25 x 12 mm Promus Premier DES, EF 55%  . CORONARY/GRAFT ACUTE MI REVASCULARIZATION N/A 12/31/2016   Procedure: Coronary/Graft Acute MI Revascularization;  Surgeon: Sherren Mocha, MD;  Location: [facility] CV LAB;  Service: Cardiovascular;  Laterality: N/A;  . HERNIA REPAIR     times 2  umbilical  . LEFT HEART CATH AND  CORONARY ANGIOGRAPHY N/A 12/31/2016   Procedure: Left Heart Cath and Coronary Angiography;  Surgeon: Sherren Mocha, MD;  Location: [facility] CV LAB;  Service: Cardiovascular;  Laterality: N/A;  . LEFT HEART CATHETERIZATION WITH CORONARY ANGIOGRAM N/A 09/14/2014   Procedure: LEFT HEART CATHETERIZATION WITH CORONARY ANGIOGRAM;  Surgeon: Burnell Blanks, MD;  Location[facility] CATH LAB;  Service: Cardiovascular;  Laterality: N/A;  . PERCUTANEOUS CORONARY STENT INTERVENTION (PCI-S)  09/14/2014   Procedure: PERCUTANEOUS CORONARY STENT INTERVENTION (PCI-S);  Surgeon: Burnell Blanks, MD;  Location[facility] C[facility] LAB;  Service: Cardiovascular;;  . TONSILLECTOMY  2005    OB History    No data available       Home Medications    Prior to Admission medications   Medication Sig Start Date End Date Taking? Authorizing Provider  amLODipine (NORVASC) 5 MG tablet Take 1 tablet (5 mg total) by mouth daily. 06/17/17 06/17/18  Isaiah Serge, NP  ARIPiprazole (ABILIFY) 10 MG tablet Take 1 tablet (10 mg total) by mouth daily. For mood control 09/01/17   Money, Lowry Ram, FNP  aspirin 81 MG chewable tablet Chew 1 tablet (81 mg total) by mouth daily. 09/16/14   Nita Sells, MD  atorvastatin (LIPITOR) 80 MG tablet TAKE 1 TABLET(80 MG) BY MOUTH DAILY 09/10/17   Hilty, Nadean Corwin, MD  baclofen (LIORESAL) 10 MG tablet Take up to 4/day for muscle spasms Patient taking differently: Take 10 mg 4 (four) times daily as needed by mouth for muscle spasms (1 tablet in the morning and 1 inth eevenining and 2 at bedtime).  03/12/17   Sater, Nanine Means, MD  folic acid (FOLVITE) 1 MG tablet Take 1 mg by mouth daily.    [provider]  furosemide (LASIX) 20 MG tablet TAKE 1 TABLET(20 MG) BY MOUTH DAILY AS NEEDED Patient taking differently: TAKE 1 TABLET(20 MG) BY MOUTH DAILY AS NEEDED FOR FLUID RETENTION 05/28/17   Hilty, Nadean Corwin, MD  gabapentin (NEURONTIN) 300 MG capsule Take 1 capsule (300 mg total) by mouth 2  (two) times daily. 08/31/17   Money, Lowry Ram, FNP  hydrOXYzine (ATARAX/VISTARIL) 25 MG tablet Take 1 tablet (25 mg total) by mouth every 6 (six) hours as needed for anxiety. 08/31/17   Money, Lowry Ram, FNP  lisinopril (PRINIVIL,ZESTRIL) 20 MG tablet Take 1 tablet (20 mg total) by mouth 2 (two) times daily. 08/03/17 11/01/17  Hilty, Nadean Corwin, MD  nitroGLYCERIN (NITROSTAT) 0.4 MG SL tablet Place 1 tablet (0.4 mg total) under the tongue every 5 (five) minutes as needed for chest pain. 10/20/16 08/27/17  Erma Heritage, PA-C  non-metallic deodorant Jethro Poling) MISC Apply 1 application topically.  Hayden Pedro, PA-C  pantoprazole (PROTONIX) 40 MG tablet Take 1 tablet (40 mg total) by mouth 2 (two) times daily. 01/02/17   Delos Haring, PA-C  prasugrel (EFFIENT) 10 MG TABS tablet Take 1 tablet (10 mg total) by mouth daily. 09/07/17   Pixie Casino, MD  Probiotic Product (PROBIOTIC PO) Take 1 tablet by mouth daily.    [provider]  sertraline (ZOLOFT) 50 MG tablet Take 1 tablet (50 mg total) by mouth daily. For mood control 09/01/17   Money, Lowry Ram, FNP  silver sulfADIAZINE (SILVADENE) 1 % cream Apply 1 application topically daily. 09/18/17   [provider]  tamoxifen (NOLVADEX) 20 MG tablet Take 1 tablet (20 mg total) by mouth daily. 04/04/17   Nicholas Lose, MD  traZODone (DESYREL) 50 MG tablet Take 1 tablet (50 mg total) by mouth at bedtime as needed for sleep. 08/31/17   Money, Lowry Ram, FNP    Family History Family History  Problem Relation Age of Onset  . Diabetes Mother   . Heart disease Mother   . Hyperlipidemia Mother   . Hypertension Mother   . Lung cancer Father   . Drug abuse Father   . Brain cancer Father 53  . Bone cancer Father 72  . Drug abuse Sister   . Mental illness Brother   . Mental illness Maternal Grandmother   . Stomach cancer Paternal Grandmother 13       d.75    Social History Social History   Tobacco Use  . Smoking status:  Former Smoker    Packs/day: 0.50    Years: 30.00    Pack years: 15.00    Types: Cigarettes    Last attempt to quit: 10/05/2015    Years since quitting: 1.9  . Smokeless tobacco: Never Used  Substance Use Topics  . Alcohol use: Yes    Alcohol/week: 1.2 oz    Types: 2 Cans of beer per week  . Drug use: No     Allergies   Pork-derived products   Review of Systems Review of Systems  Constitutional: Negative for chills, diaphoresis and fever.  Respiratory: Positive for shortness of breath ("a little bit with talking"). Negative for cough.   Cardiovascular: Positive for chest pain. Negative for orthopnea, claudication and leg swelling.  Gastrointestinal: Negative for abdominal pain, constipation, diarrhea, nausea and vomiting.  Genitourinary: Negative for dysuria and hematuria.  Musculoskeletal: Negative for arthralgias and myalgias.  Skin: Negative for color change.  Allergic/Immunologic: Negative for immunocompromised state.  Neurological: Negative for weakness, light-headedness and numbness.  Psychiatric/Behavioral: Negative for confusion.   All other systems reviewed and are negative for acute change except as noted in the HPI.    Physical Exam Updated Vital Signs BP (!) 158/88 (BP Location: Left Arm)   Pulse 66   Temp 99.2 F (37.3 C) (Oral)   Resp 17   Ht 5' 2" (1.575 m)   Wt 63.5 kg (140 lb)   LMP 09/12/2017 (Exact Date)   SpO2 100%   BMI 25.61 kg/m   Physical Exam  Constitutional: She is oriented to person, place, and time. Vital signs are normal. She appears well-developed and well-nourished.  Non-toxic appearance. No distress.  Afebrile, nontoxic, NAD  HENT:  Head: Normocephalic and atraumatic.  Mouth/Throat: Oropharynx is clear and moist and mucous membranes are normal.  Eyes: Conjunctivae and EOM are normal. Right eye exhibits no discharge. Left eye exhibits no discharge.  Neck: Normal range of motion. Neck supple.  Cardiovascular:  Normal rate, regular  rhythm, normal heart sounds and intact distal pulses. Exam reveals no gallop and no friction rub.  No murmur heard. RRR, nl s1/s2, no m/r/g, distal pulses intact, no pedal edema   Pulmonary/Chest: Effort normal and breath sounds normal. No respiratory distress. She has no decreased breath sounds. She has no wheezes. She has no rhonchi. She has no rales. She exhibits no tenderness, no crepitus, no deformity and no retraction.  CTAB in all lung fields, no w/r/r, no hypoxia or increased WOB, speaking in full sentences, SpO2 100% on RA Chest wall nonTTP without crepitus, deformities, or retractions   Abdominal: Soft. Normal appearance and bowel sounds are normal. She exhibits no distension. There is no tenderness. There is no rigidity, no rebound, no guarding, no CVA tenderness, no tenderness at McBurney's point and negative Murphy's sign.  Soft, nondistended, +BS throughout, with mild discomfort reported with palpation of RUQ but denies pain/tenderness to palpation just states it's uncomfortable; no r/g/r, neg murphy's, neg mcburney's, no CVA TTP   Musculoskeletal: Normal range of motion.  MAE x4 Strength and sensation grossly intact in all extremities Distal pulses intact No pedal edema, neg homan's bilaterally   Neurological: She is alert and oriented to person, place, and time. She has normal strength. No sensory deficit.  Skin: Skin is warm, dry and intact. No rash noted.  Psychiatric: She has a normal mood and affect.  Nursing note and vitals reviewed.    ED Treatments / Results  Labs (all labs ordered are listed, but only abnormal results are displayed) Labs Reviewed  BASIC METABOLIC PANEL - Abnormal; Notable for the following components:      Result Value   Sodium 132 (*)    CO2 20 (*)    Creatinine, Ser 1.09 (*)    All other components within normal limits  CBC - Abnormal; Notable for the following components:   WBC 11.8 (*)    Hemoglobin 10.2 (*)    HCT 31.3 (*)    RDW 18.1  (*)    Platelets 475 (*)    All other components within normal limits  HEPATIC FUNCTION PANEL - Abnormal; Notable for the following components:   Total Protein 6.3 (*)    Albumin 3.4 (*)    Bilirubin, Direct <0.1 (*)    All other components within normal limits  BRAIN NATRIURETIC PEPTIDE  D-DIMER, QUANTITATIVE (NOT AT [facility])  I-STAT TROPONIN, ED  I-STAT BETA HCG BLOOD, ED ([facility], [facility], [facility] ONLY)  I-STAT TROPONIN, ED    EKG  EKG Interpretation  Date/Time:  Wednesday September 26 2017 13:23:05 EST Ventricular Rate:  67 PR Interval:  138 QRS Duration: 82 QT Interval:  436 QTC Calculation: 460 R Axis:   71 Text Interpretation:  Normal sinus rhythm Normal ECG no acute changes  Confirmed by Brantley Stage [phone]) on 09/26/2017 5:18:12 PM       Radiology Dg Chest 2 View  Result Date: 09/26/2017 CLINICAL DATA:  Intermittent mid chest pain for 2 days, stabbing sensation. History of myocardial infarction and breast cancer. EXAM: CHEST  2 VIEW COMPARISON:  Chest radiograph May 16, 2017 FINDINGS: Cardiomediastinal silhouette is normal. Coronary artery stent. No pleural effusions or focal consolidations. Trachea projects midline and there is no pneumothorax. Soft tissue planes and included osseous structures are non-suspicious. Surgical clips RIGHT breast. IMPRESSION: No acute cardiopulmonary process. Electronically Signed   By: Elon Alas M.D.   On: 09/26/2017 14:02    Procedures Procedures (including critical care time)  Myocardial Stress Test 11/14/16: Study Highlights    The left ventricular ejection fraction is normal (55-65%).  Nuclear stress EF: 56%.  There was no ST segment deviation noted during stress.  This is a low risk study. No ischemia identified   Candee Furbish, MD    Heart Cath 12/31/16: Conclusion  1. Acute total occlusion of the RCA, treated successful with PCI using a 2.75x38 mm Promus DES, deployed in overlapping fashion with the old stent which was widely  patent 2. Mild nonobstructive LAD/LCx stenosis 3. Mild segmental LV systolic dysfunction with severe hypokinesis of the basal and mid inferior walls, but vigorous contractility of the anterior and apical walls, LVEF estimated 50-55%  Recommend:  DAPT with ASA and brilinta at least 12 months without interruption    Echo 01/01/17: Study Conclusions - Left ventricle: The cavity size was normal. There was moderate   concentric hypertrophy. Systolic function was vigorous. The   estimated ejection fraction was in the range of 65% to 70%. There   was dynamic obstruction at rest, with a peak velocity of 291   cm/sec and a peak gradient of 34 mm Hg. Wall motion was normal;   there were no regional wall motion abnormalities. Doppler   parameters are consistent with abnormal left ventricular   relaxation (grade 1 diastolic dysfunction). Doppler parameters   are consistent with high ventricular filling pressure. - Aortic valve: Transvalvular velocity was within the normal range.   There was no stenosis. There was no regurgitation. - Mitral valve: Transvalvular velocity was within the normal range.   There was no evidence for stenosis. There was no regurgitation. - Right ventricle: The cavity size was normal. Wall thickness was   normal. Systolic function was normal. - Atrial septum: No defect or patent foramen ovale was identified   by color flow Doppler. - Tricuspid valve: There was trivial regurgitation. - Pulmonary arteries: Systolic pressure was within the normal   range. PA peak pressure: 15 mm Hg (S).   Medications Ordered in ED Medications  nitroGLYCERIN (NITROSTAT) SL tablet 0.4 mg (0.4 mg Sublingual Not Given 09/26/17 1818)  gi cocktail (Maalox,Lidocaine,Donnatal) (30 mLs Oral Given 09/26/17 1817)  morphine 4 MG/ML injection 4 mg (4 mg Intravenous Given 09/26/17 1817)     Initial Impression / Assessment and Plan / ED Course  I have reviewed the triage vital signs and the  nursing notes.  Pertinent labs & imaging results that were available during my care of the patient were reviewed by me and considered in my medical decision making (see chart for details).     46 y.o. female here with gradually worsening chest pain that began intermittent 2 days ago but became more constant just under 24hrs ago. Feels "a little bit" short of breath with talking. Currently undergoing radiation for breast cancer (just finished yesterday). On exam, no reproducible chest tenderness, clear lung exam, no tachycardia or hypoxia, no pedal edema, mild discomfort reported with palpation of  RUQ but not tender and neg murphy's sign. Work up thus far reveals: EKG nonischemic, betaHCG neg, troponin negative at 18hr mark from when it became more constant so will repeat now since it's been almost 4hrs since then, BMP with marginally low Na 132 and Cr 1.09 which is better than it has been, CBC with mildly elevated WBC 11.8 similar to prior as well as chronic stable anemia and mild thrombocytosis which has also been seen; CXR neg. Pt is moderate risk for PE considering cancer diagnosis, so will  proceed with D-dimer. Will add on BNP and LFTs, and get second troponin. Will give NTG, morphine, and GI cocktail; will hold off on more ASA since she's on ASA and prasugrel. Doubt need for any imaging of her RUQ at this time, if LFTs deranged then may consider this. Will reassess shortly.   7:20 PM D-dimer negative. BNP WNL. LFTs essentially WNL. Second trop negative at 22.5hr mark from onset of worsening/more constant pain. Pt received GI cocktail/morphine/NTG x1 and now feels completely chest pain free (hard to say which one did it because she got all of them at the exact same time, but she previously hadn't had significant relief from NTG, so it's possible that the morphine/GI cocktail was was improved it). Given that she's high risk, will touch base with cardiology to see if we could possibly just have her  closely f/up outpatient rather than admit today, given 2 neg troponins and reassuring EKG/work up. Discussed case with my attending Dr. Oleta Mouse who agrees with plan.   7:31 PM Dr. Domenic Polite of cardiology returning page, agrees that she could likely follow up outpatient, given that it's atypical and she's already basically had an observation with two negative troponins 4 hrs apart and otherwise reassuring work up today; agrees that she may warrant outpatient stress test but doesn't feel that it's necessary she stay overnight for this, assuming she feels comfortable with this idea; if she's not comfortable, he feels it's reasonable to admit for obs overnight. Pt requesting some time to think about it.   7:43 PM Pt has decided that she's concerned that if she goes home, her pain will return and she'd have to come back; she doesn't feel comfortable with this plan. Discussed r/b/a, and discussed at length that she may end up just getting one more troponin and may still be discharged with outpatient stress test f/up, and pt still feels she wants to be here for the third troponin and monitoring. Will proceed with admission. Pt chest pain free at this time.   8:07 PM Dr. Shanon Brow of [facility] returning page, wants me to inquire as to whether cardiology would be doing an inpatient stress test or not, because if they aren't doing that then she does not see the need for the admission, despite the prior recommendations.   8:23 PM Dr. Teena Dunk of cardiology returning page and will order stress test. Called Dr. Shanon Brow with [facility] and informed her of this, and she now agrees to admit. Holding orders to be placed by admitting team. Please see their notes for further documentation of care. I appreciate their help with this pleasant pt's care. Pt stable at time of admission.    Final Clinical Impressions(s) / ED Diagnoses   Final diagnoses:  Precordial chest pain  SOB (shortness of breath) on exertion  Chronic anemia  Thrombocytosis  [facility])    ED Discharge Orders    [address] [place], [place] 09/26/17 2024    Forde Dandy, MD 09/26/17 [phone]

## 2017-09-26 NOTE — Telephone Encounter (Signed)
°  New Prob  Pt c/o of Chest Pain: STAT if CP now or developed within 24 hours  1. Are you having CP right now? Yes  2. Are you experiencing any other symptoms (ex. SOB, nausea, vomiting, sweating)? No  3. How long have you been experiencing CP? Since yesterday  4. Is your CP continuous or coming and going? Continuous   5. Have you taken Nitroglycerin? Yes ?

## 2017-09-26 NOTE — Telephone Encounter (Signed)
Received call from patient.She stated she has been having chest pain for the past 1&1/2 days.Stated chest pain relieved by NTG x 1 last night.She is presently having # 6 chest pain.Advised to go to ED.Trish called.

## 2017-09-27 ENCOUNTER — Observation Stay (HOSPITAL_BASED_OUTPATIENT_CLINIC_OR_DEPARTMENT_OTHER): Payer: Federal, State, Local not specified - PPO

## 2017-09-27 ENCOUNTER — Encounter: Payer: Self-pay | Admitting: Radiation Oncology

## 2017-09-27 DIAGNOSIS — R079 Chest pain, unspecified: Secondary | ICD-10-CM | POA: Diagnosis not present

## 2017-09-27 DIAGNOSIS — I2510 Atherosclerotic heart disease of native coronary artery without angina pectoris: Secondary | ICD-10-CM | POA: Diagnosis not present

## 2017-09-27 DIAGNOSIS — R0789 Other chest pain: Secondary | ICD-10-CM

## 2017-09-27 DIAGNOSIS — I10 Essential (primary) hypertension: Secondary | ICD-10-CM

## 2017-09-27 DIAGNOSIS — N183 Chronic kidney disease, stage 3 (moderate): Secondary | ICD-10-CM

## 2017-09-27 LAB — ECHOCARDIOGRAM LIMITED
Height: 62 in
Weight: 2180.79 oz

## 2017-09-27 LAB — TROPONIN I
Troponin I: <0.03 ng/mL (ref <0.03)
Troponin I: <0.03 ng/mL (ref <0.03)

## 2017-09-27 MED ORDER — KETOROLAC TROMETHAMINE 15 MG/ML IJ SOLN
15.0000 mg | Freq: Once | INTRAMUSCULAR | Status: AC
  Administered 2017-09-27: 15 mg via INTRAVENOUS
  Filled 2017-09-27: qty 1

## 2017-09-27 NOTE — Discharge Summary (Signed)
Physician Discharge Summary  Anna Henson MLY:[id number] DOB: 11-22-1970 DOA: 09/26/2017  PCP: Wardell Honour, MD  Admit date: 09/26/2017 Discharge date: 09/27/2017  Admitted From: Home Disposition:  Home  Recommendations for Outpatient Follow-up:  1. Follow up with PCP in 1 week 2. Follow up with Cardiology   Discharge Condition: Stable CODE STATUS: Full  Diet recommendation: Heart healthy   Brief/Interim Summary: Anna Henson a 46 y.o.femalewith medical history significant ofCAD with multiple stents comes in with substernal chest pain that radiates to right upper back which feels similar to previous heart attack but not as severe. Her last heart cath was in March 2018 which her RCA needed stented. She could not tolerate plavix due to her medication for her breast cancer and has been on aspirin and effient. She is compliant with her meds. She was observed for further work up and evaluation.  Cardiology was consulted.   Discharge Diagnoses:  Principal Problem:   Chest pain Active Problems:   Essential hypertension   HLD (hyperlipidemia)   CAD (coronary artery disease), native coronary artery   Chronic kidney disease   S/P cardiac cath (12/2016) a. acute total occlusion of the RCA tx w/PCI DES, b. mild nonobs LAD/LCx stenosis, c. mild segmental LV sys dx w/ sev hypokinesis, LVEF 50-55%   HOCM (hypertrophic obstructive cardiomyopathy) (HCC)   Chronic anemia  Atypical chest pain  -Troponin negative, D Dimer negative, EKG without ST or T wave changes  -Cardiology following, planning for echocardiogram, echo showed no regional wall motion abnormalities. No further cardiac testing planned.  -Patient reports feeling much better and no further chest pain   Coronary artery disease -S/p DES to RCA in 09/2014, recurrent DES to RCA in 12/2016 -Follows with Dr. Debara Pickett  -Continue aspirin, effient   Essential hypertension -Continue norvasc, lisinopril, lopressor    Hyperlipidemia -Continue lipitor    Chronic kidney disease stage 3 -Cr stable   Breast cancer s/p lumpectomy with radiation therapy  -Follows Dr. Lindi Adie  -Continue tamoxifen   GERD -Continue protonix     Discharge Instructions  Discharge Instructions    Call MD for:  difficulty breathing, headache or visual disturbances   Complete by:  As directed    Call MD for:  extreme fatigue   Complete by:  As directed    Call MD for:  hives   Complete by:  As directed    Call MD for:  persistant dizziness or light-headedness   Complete by:  As directed    Call MD for:  persistant nausea and vomiting   Complete by:  As directed    Call MD for:  severe uncontrolled pain   Complete by:  As directed    Call MD for:  temperature >100.4   Complete by:  As directed    Diet - low sodium heart healthy   Complete by:  As directed    Discharge instructions   Complete by:  As directed    You were cared for by a hospitalist during your hospital stay. If you have any questions about your discharge medications or the care you received while you were in the hospital after you are discharged, you can call the unit and asked to speak with the hospitalist on call if the hospitalist that took care of you is not available. Once you are discharged, your primary care physician will handle any further medical issues. Please note that NO REFILLS for any discharge medications will be authorized once you are discharged, as it  is imperative that you return to your primary care physician (or establish a relationship with a primary care physician if you do not have one) for your aftercare needs so that they can reassess your need for medications and monitor your lab values.   Increase activity slowly   Complete by:  As directed      Allergies as of 09/27/2017      Reactions   Pork-derived Products Other (See Comments)   Does NOT eat pork      Medication List    TAKE these medications   acetaminophen  325 MG tablet Commonly known as:  TYLENOL Take 325-650 mg by mouth every 8 (eight) hours as needed (for pain or cramping).   amLODipine 5 MG tablet Commonly known as:  NORVASC Take 1 tablet (5 mg total) by mouth daily.   ARIPiprazole 10 MG tablet Commonly known as:  ABILIFY Take 1 tablet (10 mg total) by mouth daily. For mood control What changed:  additional instructions   aspirin 81 MG chewable tablet Chew 1 tablet (81 mg total) by mouth daily.   atorvastatin 80 MG tablet Commonly known as:  LIPITOR TAKE 1 TABLET(80 MG) BY MOUTH DAILY What changed:  See the new instructions.   baclofen 10 MG tablet Commonly known as:  LIORESAL Take up to 4/day for muscle spasms What changed:    how much to take  how to take this  when to take this  additional instructions   clonazePAM 1 MG tablet Commonly known as:  KLONOPIN Take 0.5 mg by mouth every evening.   folic acid 1 MG tablet Commonly known as:  FOLVITE Take 1 mg by mouth daily.   furosemide 20 MG tablet Commonly known as:  LASIX TAKE 1 TABLET(20 MG) BY MOUTH DAILY AS NEEDED What changed:  See the new instructions.   gabapentin 300 MG capsule Commonly known as:  NEURONTIN Take 1 capsule (300 mg total) by mouth 2 (two) times daily.   hydrOXYzine 25 MG capsule Commonly known as:  VISTARIL Take 25-50 mg by mouth at bedtime as needed (for anxiety or sleep).   hydrOXYzine 25 MG tablet Commonly known as:  ATARAX/VISTARIL Take 1 tablet (25 mg total) by mouth every 6 (six) hours as needed for anxiety.   lisinopril 20 MG tablet Commonly known as:  PRINIVIL,ZESTRIL Take 1 tablet (20 mg total) by mouth 2 (two) times daily.   metoprolol tartrate 50 MG tablet Commonly known as:  LOPRESSOR Take 25 mg by mouth 2 (two) times daily.   nitroGLYCERIN 0.4 MG SL tablet Commonly known as:  NITROSTAT Place 1 tablet (0.4 mg total) under the tongue every 5 (five) minutes as needed for chest pain.   pantoprazole 40 MG  tablet Commonly known as:  PROTONIX Take 1 tablet (40 mg total) by mouth 2 (two) times daily.   prasugrel 10 MG Tabs tablet Commonly known as:  EFFIENT Take 1 tablet (10 mg total) by mouth daily.   sertraline 50 MG tablet Commonly known as:  ZOLOFT Take 1 tablet (50 mg total) by mouth daily. For mood control What changed:  additional instructions   silver sulfADIAZINE 1 % cream Commonly known as:  SILVADENE Apply 1 application topically daily as needed (as directed/for irritation).   tamoxifen 20 MG tablet Commonly known as:  NOLVADEX Take 1 tablet (20 mg total) by mouth daily.   traZODone 50 MG tablet Commonly known as:  DESYREL Take 1 tablet (50 mg total) by mouth at bedtime as  needed for sleep.      Follow-up Information    Wardell Honour, MD Follow up.   Specialty:  Family Medicine Contact information: [place] [address] [phone]        Pixie Casino, MD Follow up.   Specialty:  Cardiology Contact information: [place] [address] [phone]          Allergies  Allergen Reactions  . Pork-Derived Products Other (See Comments)    Does NOT eat pork    Consultations:  Cardiology    Procedures/Studies: Dg Chest 2 View  Result Date: 09/26/2017 CLINICAL DATA:  Intermittent mid chest pain for 2 days, stabbing sensation. History of myocardial infarction and breast cancer. EXAM: CHEST  2 VIEW COMPARISON:  Chest radiograph May 16, 2017 FINDINGS: Cardiomediastinal silhouette is normal. Coronary artery stent. No pleural effusions or focal consolidations. Trachea projects midline and there is no pneumothorax. Soft tissue planes and included osseous structures are non-suspicious. Surgical clips RIGHT breast. IMPRESSION: No acute cardiopulmonary process. Electronically Signed   By: Elon Alas M.D.   On: 09/26/2017 14:02   Mr Jeri Cos CH Contrast  Addendum Date: 09/07/2017   ADDENDUM: A  transcription error occurred. The impression should read: IMPRESSION:  This MRI of the brain with and without contrast shows some T2/FLAIR hyperintense foci in the subcortical white matter of the hemispheres unchanged in appearance compared to the 02/02/2016 MRI. This is a nonspecific finding and could represent minimal chronic microvessel ischemic change or be the sequela of migraine or prior inflammation. Richard A. Felecia Shelling, MD, PhD, Charlynn Grimes   Result Date: 09/05/2017  [facility] [address], [place], [place] [address] [phone] NEUROIMAGING REPORT STUDY DATE: 09/05/2017 PATIENT NAME: Areonna Bran DOB: Jun 19, 1971 MRN: [id number] EXAM: MRI Brain with and without contrast ORDERING CLINICIAN: Richard A. Sater, MD. 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  T1 sagittal, T2 coronal and postcontrast views in the axial and coronal plane were obtained. CONTRAST: 12 ml Magnevist IMAGING SITE: [facility], [address] FINDINGS: On sagittal images, the spinal cord is imaged caudally to C5 and is normal in caliber.   The contents of the posterior fossa are of normal size and position.   The pituitary gland and optic chiasm appear normal.    Brain volume appears normal.   The ventricles are normal in size and without distortion.  There are no abnormal extra-axial collections of fluid.  The cerebellum and brainstem appears normal.   The deep gray matter appears normal.  There are some small T2/FLAIR hyperintense foci cortical white matter of the frontal lobes, left greater right. This appears unchanged when compared to the 02/02/2016 MRI.Marland Kitchen  Diffusion weighted images are normal.  Gradient echo heme weighted images are normal.  The orbits appear  normal.   The VIIth/VIIIth nerve complex appears normal.  The mastoid air cells appear normal.  There is a mucus retention cyst within the right sphenoid. It is similar to one observed in 2017. The other paranasal sinuses appear normal.  Flow voids are identified within the major intracerebral arteries.   After the infusion of contrast material, a normal enhancement pattern is noted.    This MRI of the brain with and without contrast shows double  T2/FLAIR hyperintense foci in the subcortical white matter of the hemispheres unchanged in appearance compared to the 02/02/2016 MRI. This is a nonspecific finding and could represent minimal chronic microvessel ischemic change or be the sequela of migraine or prior inflammation. INTERPRETING PHYSICIAN: Richard A. Felecia Shelling, MD, PhD, FAAN Certified in  Neuroimaging by  [employer]   Mr Cervical Spine W Wo Contrast  Result Date: 09/05/2017  [facility] [address], [place], [place] [address] [phone] NEUROIMAGING REPORT STUDY DATE: 09/04/2017 PATIENT NAME: Fae Blossom DOB: 1970-10-27 MRN: [id number] EXAM: MRI of the cervical spine with and without contrast ORDERING CLINICIAN: Richard A. Sater, MD. 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 In addition 4 mm axial slices from V6-1 down to T1-2 level were included with T2 and gradient echo views. After the infusion of contrast, additional T1-weighted images were performed. CONTRAST: 12 mL Magnevist IMAGING SITE: [facility], [address] [place], [place] [address] FINDINGS: :  On sagittal images, the spine is imaged from above the cervicomedullary junction to T2.   There is a 3-4 mm round focus in the left thyroid that  is hyperintense on T2-weighted images and hypointense on T1-weighted images with mild enhancement. The spinal cord is of normal caliber and signal.   The spinal cord is unchanged compared with 02/02/2016 MRI and the transverse myelitis noted on 11/12/2015 is no longer apparent. The vertebral bodies are normally aligned.   The vertebral bodies have normal signal.  The discs and interspaces were further evaluated on axial views from C2 to T1 as follows: C2-C3: The disc and interspace appear normal. C3-C4: There is a small central disc protrusion mildly more to the left and mild uncovertebral spurring.   There is borderline spinal stenosis. Neural foramina are not narrowed. There is no nerve root compression. C4-C5: There is a small left paramedian disc protrusion . There is mild left foraminal narrowing but no nerve root compression. C5-C6: There is a central disc protrusion, uncovertebral spurring and possibly congenitally short pedicles combining to cause moderately severe spinal stenosis and moderate right greater than left neural foraminal narrowing. Degenerative changes in the degree of spinal stenosis has progressed when compared to the 02/02/2016 MRI C6-C7: There is a left normal disc protrusion superimposed on disc bulging and left greater than right uncovertebral spurring. There is mild spinal stenosis and severe left neural foraminal narrowing with possible left C7 nerve root compression.    This level was stable compared to the previous MRI. C7-T1: The disc and interspace appear normal. After the infusion of contrast, a normal enhancement pattern was observed.    This MRI of the cervical spine with and without contrast shows the following: 1.    The spinal cord appears normal.    2.    At C3-C4, there is borderline spinal stenosis but no nerve root compression. 3.    At C4-C5, there is mild left foraminal narrowing due to left paramedian disc protrusion but no nerve root compression. 4.    At C5-C6, there  is moderately severe spinal stenosis and moderate right greater than left neural foraminal narrowing. Central disc protrusion in the degree of spinal stenosis has progressed compared to the 02/02/2016 MRI. 5.    At C6-C7, there is a left-sided disc  osteophyte complex causing severe left foraminal narrowing and possible left C7 nerve root compression 6.    There is a small 3-4 mm nodule in the left thyroid gland.  Due to small size it is unlikely to be clinically significant. INTERPRETING PHYSICIAN: Richard A. Felecia Shelling, MD, PhD, FAAN Certified in  Neuroimaging by [employer]    Echo Left ventricle: The cavity size was normal. Systolic function was   normal. The estimated ejection fraction was in the range of 60%   to 65%. Wall motion was normal; there were no regional wall   motion abnormalities. Left ventricular diastolic function   parameters were normal. - Aortic valve: Valve area (VTI): 2.3 cm^2. Valve area (Vmax): 2.35   cm^2. Valve area (Vmean): 2.32 cm^2. - Atrial septum: No defect or patent foramen ovale was identified.    Discharge Exam: Vitals:   09/27/17 0756 09/27/17 1046  BP: 131/75 125/82  Pulse:    Resp: (!) 22 18  Temp:    SpO2:     Vitals:   09/27/17 0200 09/27/17 0236 09/27/17 0756 09/27/17 1046  BP: 107/64 125/75 131/75 125/82  Pulse: (!) 57 61    Resp: 17 13 (!) 22 18  Temp:  99.1 F (37.3 C)    TempSrc:  Oral    SpO2: 99% 100%    Weight: 61.8 kg (136 lb 4.8 oz) 61.8 kg (136 lb 4.8 oz)    Height: 5\' 2"  (1.575 m) 5\' 2"  (1.575 m)      General: Pt is alert, awake, not in acute distress Cardiovascular: RRR, S1/S2 +, no rubs, no gallops Respiratory: CTA bilaterally, no wheezing, no rhonchi Abdominal: Soft, NT, ND, bowel sounds + Extremities: no edema, no cyanosis    The results of significant diagnostics from this hospitalization (including imaging, microbiology, ancillary and laboratory) are listed below for reference.      Microbiology: No results found for this or any previous visit (from the past 240 hour(s)).   Labs: BNP (last 3 results) Recent Labs    09/26/17 1810  BNP 65.7   Basic Metabolic Panel: Recent Labs  Lab 09/26/17 1333  NA 132*  K 3.5  CL 103  CO2 20*  GLUCOSE 88  BUN 11  CREATININE 1.09*  CALCIUM 9.1   Liver Function Tests: Recent Labs  Lab 09/26/17 1810  AST 22  ALT 15  ALKPHOS 49  BILITOT 0.5  PROT 6.3*  ALBUMIN 3.4*   No results for input(s): LIPASE, AMYLASE in the last 168 hours. No results for input(s): AMMONIA in the last 168 hours. CBC: Recent Labs  Lab 09/26/17 1333  WBC 11.8*  HGB 10.2*  HCT 31.3*  MCV 80.3  PLT 475*   Cardiac Enzymes: Recent Labs  Lab 09/26/17 2206 09/27/17 0127 09/27/17 0340  TROPONINI <0.03 <0.03 <0.03   BNP: Invalid input(s): POCBNP CBG: No results for input(s): GLUCAP in the last 168 hours. D-Dimer Recent Labs    09/26/17 1810  DDIMER 0.36   Hgb A1c No results for input(s): HGBA1C in the last 72 hours. Lipid Profile No results for input(s): CHOL, HDL, LDLCALC, TRIG, CHOLHDL, LDLDIRECT in the last 72 hours. Thyroid function studies No results for input(s): TSH, T4TOTAL, T3FREE, THYROIDAB in the last 72 hours.  Invalid input(s): FREET3 Anemia work up No results for input(s): VITAMINB12, FOLATE, FERRITIN, TIBC, IRON, RETICCTPCT in the last 72 hours. Urinalysis    Component Value Date/Time   COLORURINE YELLOW 05/17/2017 [place] 05/17/2017 0238  LABSPEC 1.017 05/17/2017 0238   PHURINE 5.0 05/17/2017 0238   GLUCOSEU NEGATIVE 05/17/2017 0238   HGBUR NEGATIVE 05/17/2017 0238   BILIRUBINUR NEGATIVE 05/17/2017 0238   BILIRUBINUR negative 11/29/2016 1703   BILIRUBINUR neg 01/16/2015 0940   KETONESUR 5 (A) 05/17/2017 0238   PROTEINUR 30 (A) 05/17/2017 0238   UROBILINOGEN 0.2 11/29/2016 1703   UROBILINOGEN 1.0 09/13/2014 1529   NITRITE NEGATIVE 05/17/2017 0238   LEUKOCYTESUR NEGATIVE  05/17/2017 0238   Sepsis Labs Invalid input(s): PROCALCITONIN,  WBC,  LACTICIDVEN Microbiology No results found for this or any previous visit (from the past 240 hour(s)).   Time coordinating discharge: 40 minutes  SIGNED:  Dessa Phi, DO [facility] Pager [phone]  If 7PM-7AM, please contact night-coverage [email or web address] Password [email or web address] 09/27/2017, 1:33 PM

## 2017-09-27 NOTE — Progress Notes (Signed)
Discharge summary reviewed with pt. All questions addressed and no concerns expressed by pt at this time. PIV removed and pt d/c to lobby via wheelchair with NT.

## 2017-09-27 NOTE — Progress Notes (Signed)
  Echocardiogram 2D Echocardiogram has been performed.  Anna Henson 09/27/2017, 12:26 PM

## 2017-09-27 NOTE — Consult Note (Signed)
Cardiology Consult    Patient ID: Sharmin Foulk; [id number]; 1971/10/06   Admit date: 09/26/2017 Date of Consult: 09/27/2017  Primary Care Provider: Wardell Honour, MD Primary Cardiologist: Dr. Debara Pickett   Patient Profile    Roderick Sweezy is a 46 y.o. female with past medical history of CAD (s/p DES to RCA in 09/2014, recurrent DES to RCA in 12/2016), HTN, HLD, Stage 3 CKD, breast cancer (s/p lumpectomy with current radiation therapy), depression and anxiety  who is being seen today for the evaluation of chest pain at the request of Dr. Maylene Roes.   History of Present Illness    Ms. Penick was recently evaluated by Dr. Debara Pickett on 09/07/2017 and reported doing well from a cardiac perspective at that time, denying any recent chest pain or recent dyspnea on exertion. She had temporarily stopped Effient in 06/2017 for lumpectomy and restarted the medication afterwards without any bleeding issues.   The patient presented to [facility] ED on 09/26/2017 for recurrent chest pain. Reports having episodes of a tightness along her entire precordium which would last for 5-6 hours at a time. She did take a SL NTG with some improvement in her symptoms. She has continued to experience "twinges" of pain since which can last for seconds up to minutes. Her pain is not exacerbated with positional changes or with exertion. She denies any recent dyspnea on exertion, orthopnea, PND, or lower extremity edema.   Initial labs show WBC of 11.8, Hgb 10.2, platelets 475, K+ 3.5, and creatinine 1.09. BNP normal at 37.2. Initial and cyclic troponin values have been negative. EKG shows NSR, HR 67, with no acute ST or T-wave changes. She has been monitored on telemetry with HR remaining well-controlled in the 60's - 80's with no ectopic events.   Reports still having episodes of stabbing pain at this time lasting only for a few seconds to minutes. Not improved with SL NTG.    Past Medical History:  Diagnosis Date  . Anxiety     . Bipolar disorder ([place])   . Breast cancer, right breast ([place]) ~ 03/2017  . CAD in native artery    a. 09/2014 Cath/PCI in setting of [place] - s/p 2.25 x 12 mm Promus Premier DES to the RCA;  b. 11/2016 Myoview: EF 56%, no ischemia;  c. 12/2016 NSTEMI/PCI: LM nl, LAd 25p, LCX 30p, RCA 100p (2.75x38 Promus Premier DES), mRCA 10 ISR, EF 50-55%.  . Chronic kidney disease    she sthinks has stage 2 CKD, has had US doppler and has some stenosis , this was done in [place]   . CKD (chronic kidney disease), stage II   . Depression   . Dysrhythmia    Tachycardia in evenings  . Genetic testing 04/23/2017   Ms. Milberger underwent genetic counseling and testing for hereditary cancer syndromes on 04/05/2017. Her results were negative for mutations in all 46 genes analyzed by [facility]'s 46-gene Common Hereditary Cancers Panel. Genes analyzed include: APC, ATM, AXIN2, BARD1, BMPR1A, BRCA1, BRCA2, BRIP1, CDH1, CDKN2A, CHEK2, CTNNA1, DICER1, EPCAM, GREM1, HOXB13, KIT, MEN1, MLH1, MSH2, MSH3, MSH6, MUTYH, NBN,  . GERD (gastroesophageal reflux disease)   . Headache    "bi-weekly" (05/07/2017)  . Heart attack ([place]) 12/31/2016  . HOCM (hypertrophic obstructive cardiomyopathy) ([place])    a. 12/2016 Echo: EF 65-70%,  mod conc LVH, dynamic obstruction @ rest, peak velocity of 291 cm/sec w/ peak gradient of 77mHg, no rwma, Gr1 DD, triv TR, PASP 138mg.  . Marland Kitchenyperlipidemia   .  Hypertension    > 5 years  . Migraine    "probably 1/month" (05/07/2017)  . Palpitations   . Pneumonia    "twice when I was a child" (05/07/2017)  . Tobacco abuse 09/13/2014  . 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 ABDOMINAL HERNIA REPAIR  ~ 2001; ~ 2005  . BREAST BIOPSY Right ~ 03/2017 X 2  . BREAST LUMPECTOMY WITH RADIOACTIVE SEED AND SENTINEL LYMPH NODE BIOPSY Right 06/21/2017   Procedure: RIGHT BREAST BRACKETED SEED GUIDED LUMPECTOMY AND SENTINEL LYMPH NODE BIOPSY;  Surgeon: Jovita Kussmaul, MD;   Location: [facility];  Service: General;  Laterality: Right;  . CORONARY ANGIOPLASTY WITH STENT PLACEMENT     L main OK, LAD OK, CFX 30%, RCA 90%-0% w/ 2.25 x 12 mm Promus Premier DES, EF 55%  . CORONARY/GRAFT ACUTE MI REVASCULARIZATION N/A 12/31/2016   Procedure: Coronary/Graft Acute MI Revascularization;  Surgeon: Sherren Mocha, MD;  Location: [facility];  Service: Cardiovascular;  Laterality: N/A;  . HERNIA REPAIR     times 2  umbilical  . LEFT HEART CATH AND CORONARY ANGIOGRAPHY N/A 12/31/2016   Procedure: Left Heart Cath and Coronary Angiography;  Surgeon: Sherren Mocha, MD;  Location: [facility];  Service: Cardiovascular;  Laterality: N/A;  . LEFT HEART CATHETERIZATION WITH CORONARY ANGIOGRAM N/A 09/14/2014   Procedure: LEFT HEART CATHETERIZATION WITH CORONARY ANGIOGRAM;  Surgeon: Burnell Blanks, MD;  Location: [facility] CATH LAB;  Service: Cardiovascular;  Laterality: N/A;  . PERCUTANEOUS CORONARY STENT INTERVENTION (PCI-S)  09/14/2014   Procedure: PERCUTANEOUS CORONARY STENT INTERVENTION (PCI-S);  Surgeon: Burnell Blanks, MD;  Location: [facility] CATH LAB;  Service: Cardiovascular;;  . TONSILLECTOMY  2005     Home Medications:  Prior to Admission medications   Medication Sig Start Date End Date Taking? Authorizing Provider  acetaminophen (TYLENOL) 325 MG tablet Take 325-650 mg by mouth every 8 (eight) hours as needed (for pain or cramping).    Yes [provider]  amLODipine (NORVASC) 5 MG tablet Take 1 tablet (5 mg total) by mouth daily. 06/17/17 06/17/18 Yes Isaiah Serge, NP  ARIPiprazole (ABILIFY) 10 MG tablet Take 1 tablet (10 mg total) by mouth daily. For mood control Patient taking differently: Take 10 mg by mouth daily.  09/01/17  Yes Money, Lowry Ram, FNP  aspirin 81 MG chewable tablet Chew 1 tablet (81 mg total) by mouth daily. 09/16/14  Yes Nita Sells, MD  atorvastatin (LIPITOR) 80 MG tablet TAKE 1 TABLET(80 MG) BY MOUTH DAILY Patient taking  differently: Take 80 mg by mouth in the evening 09/10/17  Yes Hilty, Nadean Corwin, MD  baclofen (LIORESAL) 10 MG tablet Take up to 4/day for muscle spasms Patient taking differently: Take 10-20 mg by mouth See admin instructions. 10 mg in the morning then 10 mg in the afternoon then 20 mg at bedtime 03/12/17  Yes Sater, Nanine Means, MD  clonazePAM (KLONOPIN) 1 MG tablet Take 0.5 mg by mouth every evening. 08/23/17  Yes [provider]  folic acid (FOLVITE) 1 MG tablet Take 1 mg by mouth daily.   Yes [provider]  furosemide (LASIX) 20 MG tablet TAKE 1 TABLET(20 MG) BY MOUTH DAILY AS NEEDED Patient taking differently: Take 20 mg by mouth once a day as needed for obvious swelling 05/28/17  Yes Hilty, Nadean Corwin, MD  gabapentin (NEURONTIN) 300 MG capsule Take 1 capsule (300 mg total) by mouth 2 (two) times  daily. 08/31/17  Yes Money, Lowry Ram, FNP  hydrOXYzine (ATARAX/VISTARIL) 25 MG tablet Take 1 tablet (25 mg total) by mouth every 6 (six) hours as needed for anxiety. 08/31/17  Yes Money, Lowry Ram, FNP  lisinopril (PRINIVIL,ZESTRIL) 20 MG tablet Take 1 tablet (20 mg total) by mouth 2 (two) times daily. 08/03/17 11/01/17 Yes Hilty, Nadean Corwin, MD  metoprolol tartrate (LOPRESSOR) 50 MG tablet Take 25 mg by mouth 2 (two) times daily. 07/31/17  Yes [provider]  nitroGLYCERIN (NITROSTAT) 0.4 MG SL tablet Place 1 tablet (0.4 mg total) under the tongue every 5 (five) minutes as needed for chest pain. 10/20/16 09/26/17 Yes Strader, Fransisco Hertz, PA-C  pantoprazole (PROTONIX) 40 MG tablet Take 1 tablet (40 mg total) by mouth 2 (two) times daily. 01/02/17  Yes Carlota Raspberry, Tiffany, PA-C  prasugrel (EFFIENT) 10 MG TABS tablet Take 1 tablet (10 mg total) by mouth daily. 09/07/17  Yes Hilty, Nadean Corwin, MD  sertraline (ZOLOFT) 50 MG tablet Take 1 tablet (50 mg total) by mouth daily. For mood control Patient taking differently: Take 50 mg by mouth daily.  09/01/17  Yes Money, Lowry Ram, FNP  silver  sulfADIAZINE (SILVADENE) 1 % cream Apply 1 application topically daily as needed (as directed/for irritation).  09/18/17  Yes [provider]  tamoxifen (NOLVADEX) 20 MG tablet Take 1 tablet (20 mg total) by mouth daily. 04/04/17  Yes Nicholas Lose, MD  traZODone (DESYREL) 50 MG tablet Take 1 tablet (50 mg total) by mouth at bedtime as needed for sleep. 08/31/17  Yes Money, Lowry Ram, FNP  hydrOXYzine (VISTARIL) 25 MG capsule Take 25-50 mg by mouth at bedtime as needed (for anxiety or sleep).  08/16/17   [provider]    Inpatient Medications: Scheduled Meds: . amLODipine  5 mg Oral Daily  . ARIPiprazole  10 mg Oral Daily  . aspirin  81 mg Oral Daily  . atorvastatin  80 mg Oral q1800  . clonazePAM  0.5 mg Oral QPM  . folic acid  1 mg Oral Daily  . gabapentin  300 mg Oral BID  . lisinopril  20 mg Oral BID  . metoprolol tartrate  25 mg Oral BID  . pantoprazole  40 mg Oral BID  . prasugrel  10 mg Oral Daily  . tamoxifen  20 mg Oral Daily   Continuous Infusions:  PRN Meds: acetaminophen, hydrOXYzine, nitroGLYCERIN, ondansetron (ZOFRAN) IV, traZODone  Allergies:    Allergies  Allergen Reactions  . Pork-Derived Products Other (See Comments)    Does NOT eat pork    Social History:   Social History   Socioeconomic History  . Marital status: Single    Spouse name: Not on file  . Number of children: 0  . Years of education: masters  . Highest education level: Not on file  Social Needs  . Financial resource strain: Somewhat hard  . Food insecurity - worry: Often true  . Food insecurity - inability: Often true  . Transportation needs - medical: No  . Transportation needs - non-medical: No  Occupational History    Comment: [employer]  Tobacco Use  . Smoking status: Former Smoker    Packs/day: 0.50    Years: 30.00    Pack years: 15.00    Types: Cigarettes    Last attempt to quit: 10/05/2015    Years since quitting: 1.9  . Smokeless tobacco: Never Used    Substance and Sexual Activity  . Alcohol use: Yes  Alcohol/week: 1.2 oz    Types: 2 Cans of beer per week  . Drug use: No  . Sexual activity: Not Currently  Other Topics Concern  . Not on file  Social History Narrative   Consumes 2 cups of caffeine daily     Family History:    Family History  Problem Relation Age of Onset  . Diabetes Mother   . Heart disease Mother   . Hyperlipidemia Mother   . Hypertension Mother   . Lung cancer Father   . Drug abuse Father   . Brain cancer Father 22  . Bone cancer Father 62  . Drug abuse Sister   . Mental illness Brother   . Mental illness Maternal Grandmother   . Stomach cancer Paternal Grandmother 43       d.75      Review of Systems    General:  No chills, fever, night sweats or weight changes.  Cardiovascular:  No  dyspnea on exertion, edema, orthopnea, palpitations, paroxysmal nocturnal dyspnea. Positive for chest pain.  Dermatological: No rash, lesions/masses Respiratory: No cough, dyspnea Urologic: No hematuria, dysuria Abdominal:   No nausea, vomiting, diarrhea, bright red blood per rectum, melena, or hematemesis Neurologic:  No visual changes, wkns, changes in mental status.  All other systems reviewed and are otherwise negative except as noted above.  Physical Exam/Data    Vitals:   09/27/17 0100 09/27/17 0200 09/27/17 0236 09/27/17 0756  BP: 113/74 107/64 125/75 131/75  Pulse: 62 (!) 57 61   Resp: '15 17 13 ' (!) 22  Temp:   99.1 F (37.3 C)   TempSrc:   Oral   SpO2: 99% 99% 100%   Weight:  136 lb 4.8 oz (61.8 kg) 136 lb 4.8 oz (61.8 kg)   Height:  '5\' 2"'  (1.575 m) '5\' 2"'  (1.575 m)    No intake or output data in the 24 hours ending 09/27/17 0838 Filed Weights   09/26/17 1339 09/27/17 0200 09/27/17 0236  Weight: 140 lb (63.5 kg) 136 lb 4.8 oz (61.8 kg) 136 lb 4.8 oz (61.8 kg)   Body mass index is 24.93 kg/m.   General: Pleasant, NAD Psych: Normal affect. Neuro: Alert and oriented X 3. Moves all  extremities spontaneously. HEENT: Normal  Neck: Supple without bruits or JVD. Lungs:  Resp regular and unlabored, CTA. Heart: RRR no s3, s4, or murmurs. Abdomen: Soft, non-tender, non-distended, BS + x 4.  Extremities: No clubbing, cyanosis or edema. DP/PT/Radials 2+ and equal bilaterally.   EKG:  The EKG was personally reviewed and demonstrates:  NSR, HR 67, with no acute ST or T-wave changes.  Telemetry:  Telemetry was personally reviewed and demonstrates: NSR, HR in 60's - 80's with no ectopic events.   Labs/Studies     Relevant CV Studies:  NST: 05/07/2017  The study is normal. No ischemia or scar  This is a low risk study.  Nuclear stress EF: 68%.  Catheterization: 12/2016 1. Acute total occlusion of the RCA, treated successful with PCI using a 2.75x38 mm Promus DES, deployed in overlapping fashion with the old stent which was widely patent 2. Mild nonobstructive LAD/LCx stenosis 3. Mild segmental LV systolic dysfunction with severe hypokinesis of the basal and mid inferior walls, but vigorous contractility of the anterior and apical walls, LVEF estimated 50-55%  Recommend:  DAPT with ASA and brilinta at least 12 months without interruption  Laboratory Data:  Chemistry Recent Labs  Lab 09/26/17 1333  NA 132*  K 3.5  CL 103  CO2 20*  GLUCOSE 88  BUN 11  CREATININE 1.09*  CALCIUM 9.1  GFRNONAA >60  GFRAA >60  ANIONGAP 9    Recent Labs  Lab 09/26/17 1810  PROT 6.3*  ALBUMIN 3.4*  AST 22  ALT 15  ALKPHOS 49  BILITOT 0.5   Hematology Recent Labs  Lab 09/26/17 1333  WBC 11.8*  RBC 3.90  HGB 10.2*  HCT 31.3*  MCV 80.3  MCH 26.2  MCHC 32.6  RDW 18.1*  PLT 475*   Cardiac Enzymes Recent Labs  Lab 09/26/17 2206 09/27/17 0127 09/27/17 0340  TROPONINI <0.03 <0.03 <0.03    Recent Labs  Lab 09/26/17 1407 09/26/17 1827  TROPIPOC 0.00 0.00    BNP Recent Labs  Lab 09/26/17 1810  BNP 37.2    DDimer  Recent Labs  Lab 09/26/17 1810   DDIMER 0.36    Radiology/Studies:  Dg Chest 2 View  Result Date: 09/26/2017 CLINICAL DATA:  Intermittent mid chest pain for 2 days, stabbing sensation. History of myocardial infarction and breast cancer. EXAM: CHEST  2 VIEW COMPARISON:  Chest radiograph May 16, 2017 FINDINGS: Cardiomediastinal silhouette is normal. Coronary artery stent. No pleural effusions or focal consolidations. Trachea projects midline and there is no pneumothorax. Soft tissue planes and included osseous structures are non-suspicious. Surgical clips RIGHT breast. IMPRESSION: No acute cardiopulmonary process. Electronically Signed   By: Elon Alas M.D.   On: 09/26/2017 14:02    Assessment & Plan    1. Atypical Chest Pain/ CAD - the patient has known CAD, having undergone prior stenting to the RCA in 2015 with recurrent DES to RCA in 12/2016. Her most recent ischemic evaluation was a NST in 04/2017 which was low-risk.  - she presents for evaluation of pain along her entire precordium which can last for 5-6 hours at a time. The pain is not exacerbated with positional changes or with exertion.  - Cyclic troponin values have been negative. EKG shows NSR, HR 67, with no acute ST or T-wave changes.  - overall, her pain seems atypical for a cardiac etiology. Would not pursue a repeat NST with her recent imaging and the patient having received radiation for her breast cancer treatment. Will obtain a limited echo to rule-out new WMA along with a pericardial effusion. If no acute findings, would not pursue further ischemic evaluation this admission.   2. HTN - BP has been variable over the past 24 hours, improved to 131/75 on most recent check.  - continue Amlodipine 34m daily, Lisinopril 244mBID and Lopressor 2568mID.   3. HLD - Lipid Panel in 06/2017 showed total cholesterol of 120, HDL 37, and LDL 69. At goal of LDL < 70. - remains on Atorvastatin 34m56mily.   4. Stage 3 CKD - creatinine stable at 1.09, close to  baseline.   5. Breast Cancer - s/p lumpectomy in 06/2017. Finished radiation therapy earlier this week.  - followed by Oncology.     For questions or updates, please contact [facility] consult [email or web address] for contact info under Cardiology/STEMI.  Signed, BritErma Heritage-C 09/27/2017, 8:38 AM Pager: [phone] seen and examined the patient along with BritErma Heritage-C.  I have reviewed the chart, notes and new data.  I agree with PA/NP's note.  Key new complaints: Her symptoms are indeed atypical, with sharp stabbing pain and discomfort that has persisted for up to 5-6 hours, not triggered by activity and not worsened  by activity.  She mentions possible mild improvement after sublingual nitroglycerin, but the improvement lasted for hours rather than the expected few minutes.  The pain is not positional and not associated with swallowing or eating.  No pleuritic component, no reproducible tenderness. Key examination changes: Normal cardiovascular exam.  No gallops or rubs are heard. Key new findings / data: ECG is normal, repeated cardiac enzymes are normal.  D-dimer is low. Review of the images from May 07, 2017 and they are indeed consistent with normal perfusion  PLAN: Her symptoms are indeed atypical and are not identical to her previous angina. Consider post radiation acute pericarditis.  Will check echocardiogram.  This will also allow evaluation of wall motion as a sign of possible coronary insufficiency. She does not have any high risk features to suggest impending acute coronary syndrome.  The only concerning feature is the timing, roughly 9 months from her last revascularization procedure (placement of a drug-eluting stent).  It is possible that she has restenosis, but the symptoms are highly atypical. With a normal nuclear stress test in the last few months, I would not repeat a functional study. We will give a limited course of treatment with  NSAIDs.  If there is no confirmation of pericarditis and NSAIDs do not help, may eventually have to consider diagnostic angiography.  However, I see no urgency cardiac catheterization.  In fact, due to her chronic kidney problems threshold for performing contrast based procedures is high.  Sanda Klein, MD, [place] [phone] 09/27/2017, 10:54 AM

## 2017-09-27 NOTE — Progress Notes (Signed)
PROGRESS NOTE    Anna Henson  HBZ:[id number] DOB: 07/04/1971 DOA: 09/26/2017 PCP: Wardell Honour, MD     Brief Narrative:  Anna Henson is a 46 y.o. female with medical history significant of CAD with multiple stents comes in with substernal chest pain that radiates to right upper back which feels similar to previous heart attack but not as severe.  Her last heart cath was in March 2018 which her RCA needed stented.  She could not tolerate plavix due to her medication for her breast cancer and has been on aspirin and effient.  She is compliant with her meds. She was admitted for further work up and evaluation.  Cardiology was consulted  Assessment & Plan:   Principal Problem:   Chest pain Active Problems:   Essential hypertension   HLD (hyperlipidemia)   CAD (coronary artery disease), native coronary artery   Chronic kidney disease   S/P cardiac cath (12/2016) a. acute total occlusion of the RCA tx w/PCI DES, b. mild nonobs LAD/LCx stenosis, c. mild segmental LV sys dx w/ sev hypokinesis, LVEF 50-55%   HOCM (hypertrophic obstructive cardiomyopathy) ([facility])   Chronic anemia  Atypical chest pain  -Troponin negative, D Dimer negative, EKG without ST or T wave changes  -Cardiology following, planning for echocardiogram  Coronary artery disease -S/p DES to RCA in 09/2014, recurrent DES to RCA in 12/2016 -Follows with Dr. Debara Pickett  -Continue aspirin, effient   Essential hypertension -Continue norvasc, lisinopril, lopressor   Hyperlipidemia -Continue lipitor    Chronic kidney disease stage 3 -Cr stable   Breast cancer s/p lumpectomy with radiation therapy  -Follows Dr. Lindi Adie  -Continue tamoxifen   GERD -Continue protonix     DVT prophylaxis: SCD Code Status: Full Family Communication: No family at bedside Disposition Plan: Pending cardiology work up    Consultants:   Cardiology  Procedures:   None   Antimicrobials:  Anti-infectives (From admission, onward)   None       Subjective: Patient reports having 6 out of 10 sharp central chest pain with radiation to her back 2 nights prior to admission.  It has now resolved and she now only feels twinges intermittently.  Denies any diaphoresis, nausea, vomiting or dyspnea.  Objective: Vitals:   09/27/17 0100 09/27/17 0200 09/27/17 0236 09/27/17 0756  BP: 113/74 107/64 125/75 131/75  Pulse: 62 (!) 57 61   Resp: 15 17 13  (!) 22  Temp:   99.1 F (37.3 C)   TempSrc:   Oral   SpO2: 99% 99% 100%   Weight:  61.8 kg (136 lb 4.8 oz) 61.8 kg (136 lb 4.8 oz)   Height:  5\' 2"  (1.575 m) 5\' 2"  (1.575 m)    No intake or output data in the 24 hours ending 09/27/17 1207 Filed Weights   09/26/17 1339 09/27/17 0200 09/27/17 0236  Weight: 63.5 kg (140 lb) 61.8 kg (136 lb 4.8 oz) 61.8 kg (136 lb 4.8 oz)    Examination:  General exam: Appears calm and comfortable  Respiratory system: Clear to auscultation. Respiratory effort normal. Cardiovascular system: S1 & S2 heard, RRR. No JVD, murmurs, rubs, gallops or clicks. No pedal edema. Gastrointestinal system: Abdomen is nondistended, soft and nontender. No organomegaly or masses felt. Normal bowel sounds heard. Central nervous system: Alert and oriented. No focal neurological deficits. Extremities: Symmetric 5 x 5 power. Skin: No rashes, lesions or ulcers Psychiatry: Judgement and insight appear normal. Mood & affect appropriate.   Data Reviewed: I have personally reviewed  following labs and imaging studies  CBC: Recent Labs  Lab 09/26/17 1333  WBC 11.8*  HGB 10.2*  HCT 31.3*  MCV 80.3  PLT 993*   Basic Metabolic Panel: Recent Labs  Lab 09/26/17 1333  NA 132*  K 3.5  CL 103  CO2 20*  GLUCOSE 88  BUN 11  CREATININE 1.09*  CALCIUM 9.1   GFR: Estimated Creatinine Clearance: 56.4 mL/min (A) (by C-G formula based on SCr of 1.09 mg/dL (H)). Liver Function Tests: Recent Labs  Lab 09/26/17 1810  AST 22  ALT 15  ALKPHOS 49  BILITOT 0.5  PROT  6.3*  ALBUMIN 3.4*   No results for input(s): LIPASE, AMYLASE in the last 168 hours. No results for input(s): AMMONIA in the last 168 hours. Coagulation Profile: No results for input(s): INR, PROTIME in the last 168 hours. Cardiac Enzymes: Recent Labs  Lab 09/26/17 2206 09/27/17 0127 09/27/17 0340  TROPONINI <0.03 <0.03 <0.03   BNP (last 3 results) No results for input(s): PROBNP in the last 8760 hours. HbA1C: No results for input(s): HGBA1C in the last 72 hours. CBG: No results for input(s): GLUCAP in the last 168 hours. Lipid Profile: No results for input(s): CHOL, HDL, LDLCALC, TRIG, CHOLHDL, LDLDIRECT in the last 72 hours. Thyroid Function Tests: No results for input(s): TSH, T4TOTAL, FREET4, T3FREE, THYROIDAB in the last 72 hours. Anemia Panel: No results for input(s): VITAMINB12, FOLATE, FERRITIN, TIBC, IRON, RETICCTPCT in the last 72 hours. Sepsis Labs: No results for input(s): PROCALCITON, LATICACIDVEN in the last 168 hours.  No results found for this or any previous visit (from the past 240 hour(s)).     Radiology Studies: Dg Chest 2 View  Result Date: 09/26/2017 CLINICAL DATA:  Intermittent mid chest pain for 2 days, stabbing sensation. History of myocardial infarction and breast cancer. EXAM: CHEST  2 VIEW COMPARISON:  Chest radiograph May 16, 2017 FINDINGS: Cardiomediastinal silhouette is normal. Coronary artery stent. No pleural effusions or focal consolidations. Trachea projects midline and there is no pneumothorax. Soft tissue planes and included osseous structures are non-suspicious. Surgical clips RIGHT breast. IMPRESSION: No acute cardiopulmonary process. Electronically Signed   By: Elon Alas M.D.   On: 09/26/2017 14:02      Scheduled Meds: . amLODipine  5 mg Oral Daily  . ARIPiprazole  10 mg Oral Daily  . aspirin  81 mg Oral Daily  . atorvastatin  80 mg Oral q1800  . clonazePAM  0.5 mg Oral QPM  . folic acid  1 mg Oral Daily  . gabapentin   300 mg Oral BID  . lisinopril  20 mg Oral BID  . metoprolol tartrate  25 mg Oral BID  . pantoprazole  40 mg Oral BID  . prasugrel  10 mg Oral Daily  . tamoxifen  20 mg Oral Daily   Continuous Infusions:   LOS: 0 days    Time spent: 40 minutes   Dessa Phi, DO [facility] [email or web address] Password [facility] 09/27/2017, 12:07 PM

## 2017-09-27 NOTE — Progress Notes (Signed)
  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 During treatment the patient had desquamation with hyperpigmentation in the inframammary fold. She denied pain during treatment.  Plan: The patient has completed radiation treatment. The patient will return to radiation oncology clinic for routine followup in one month. I advised them to call or return sooner if they have any questions or concerns related to their recovery or treatment.  ------------------------------------------------  Jodelle Gross, MD, PhD  This document serves as a record of services personally performed by Kyung Rudd, MD. It was created on his behalf by Bethann Humble, a trained medical scribe. The creation of this record is based on the scribe's personal observations and the provider's statements to them. This document has been checked and approved by the attending provider.

## 2017-10-02 NOTE — Assessment & Plan Note (Addendum)
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

## 2017-10-03 ENCOUNTER — Telehealth: Payer: Self-pay | Admitting: Hematology and Oncology

## 2017-10-03 ENCOUNTER — Ambulatory Visit (HOSPITAL_BASED_OUTPATIENT_CLINIC_OR_DEPARTMENT_OTHER): Payer: Federal, State, Local not specified - PPO | Admitting: Hematology and Oncology

## 2017-10-03 DIAGNOSIS — C50511 Malignant neoplasm of lower-outer quadrant of right female breast: Secondary | ICD-10-CM

## 2017-10-03 DIAGNOSIS — Z170 Estrogen receptor positive status [ER+]: Secondary | ICD-10-CM

## 2017-10-03 NOTE — Progress Notes (Signed)
Patient Care Team: Wardell Honour, MD as PCP - General (Family Medicine) Felecia Shelling, Nanine Means, MD as Consulting Physician (Neurology) Jovita Kussmaul, MD as Consulting Physician (General Surgery) Nicholas Lose, MD as Consulting Physician (Hematology and Oncology) Kyung Rudd, MD as Consulting Physician (Radiation Oncology)  DIAGNOSIS:  Encounter Diagnosis  Name Primary?  . Malignant neoplasm of lower-outer quadrant of right breast of female, estrogen receptor positive ([place])     SUMMARY OF ONCOLOGIC HISTORY:   Malignant neoplasm of lower-outer quadrant of right breast of female, estrogen receptor positive ([place])   03/26/2017 Initial Diagnosis    Palpable right breast mass with a normal mammogram: 8:00 position by ultrasound 3.2 cm irregular mass: Grade 2 invasive lobular cancer ER 85%, PR 100%, Ki-67 2%, HER-2 negative ratio 1.5, T2 N0 stage II a clinical stage      04/20/2017 Genetic Testing    Genetic counseling and testing for hereditary cancer syndromes performed on . Results are negative for pathogenic mutations in 46 genes analyzed by [facility] Common Hereditary Cancers Panel. Results are dated . Genes tested: APC, ATM, AXIN2, BARD1, BMPR1A, BRCA1, BRCA2, BRIP1, CDH1, CDKN2A, CHEK2, CTNNA1, DICER1, EPCAM, GREM1, HOXB13, KIT, MEN1, MLH1, MSH2, MSH3, MSH6, MUTYH, NBN, NF1, NTHL1, PALB2, PDGFRA, PMS2, POLD1, POLE, PTEN, RAD50, RAD51C, RAD51D, SDHA, SDHB, SDHC, SDHD, SMAD4, SMARCA4, STK11, TP53, TSC1, TSC2, and VHL.  Variants of uncertain significance (not clinically actionable) were noted in APC and BRIP1.         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  She completed radiation therapy on 09/25/2017.  She is here today to discuss the role of adjuvant antiestrogen therapy.  She is tolerating very well from the effects of radiation.  She was taking tamoxifen prior to surgery and kept in continuing the tamoxifen even through radiation and appears to have tolerated it very well.  She does not have any hot flashes or night sweats or muscle aches or pains.  She did have brief episodes of  right-sided chest discomfort during radiation therapy which have subsided as well.  REVIEW OF SYSTEMS:   Constitutional: Denies fevers, chills or abnormal weight loss Eyes: Denies blurriness of vision Ears, nose, mouth, throat, and face: Denies mucositis or sore throat Respiratory: Denies cough, dyspnea or wheezes Cardiovascular: Denies palpitation, chest discomfort Gastrointestinal:  Denies nausea, heartburn or change in bowel habits Skin: Denies abnormal skin rashes Lymphatics: Denies new lymphadenopathy or easy bruising Neurological:Denies numbness, tingling or new weaknesses Behavioral/Psych: Mood is stable, no new changes  Extremities: No lower extremity edema Breast: Radiation dermatitis is healing All other systems were reviewed with the patient and are negative.  I have reviewed the past medical history, past surgical history, social history and family history with the patient and they are unchanged from previous note.  ALLERGIES:  is allergic to pork-derived products.  MEDICATIONS:  Current Outpatient Medications  Medication Sig Dispense Refill  . acetaminophen (TYLENOL) 325  MG tablet Take 325-650 mg by mouth every 8 (eight) hours as needed (for pain or cramping).     Marland Kitchen amLODipine (NORVASC) 5 MG tablet Take 1 tablet (5 mg total) by mouth daily. 30 tablet 11  . ARIPiprazole  (ABILIFY) 10 MG tablet Take 1 tablet (10 mg total) by mouth daily. For mood control (Patient taking differently: Take 10 mg by mouth daily. ) 30 tablet 0  . aspirin 81 MG chewable tablet Chew 1 tablet (81 mg total) by mouth daily.    Marland Kitchen atorvastatin (LIPITOR) 80 MG tablet TAKE 1 TABLET(80 MG) BY MOUTH DAILY (Patient taking differently: Take 80 mg by mouth in the evening) 90 tablet 0  . baclofen (LIORESAL) 10 MG tablet Take up to 4/day for muscle spasms (Patient taking differently: Take 10-20 mg by mouth See admin instructions. 10 mg in the morning then 10 mg in the afternoon then 20 mg at bedtime) 120 each 11  . clonazePAM (KLONOPIN) 1 MG tablet Take 0.5 mg by mouth every evening.  5  . folic acid (FOLVITE) 1 MG tablet Take 1 mg by mouth daily.    . furosemide (LASIX) 20 MG tablet TAKE 1 TABLET(20 MG) BY MOUTH DAILY AS NEEDED (Patient taking differently: Take 20 mg by mouth once a day as needed for obvious swelling) 10 tablet 0  . gabapentin (NEURONTIN) 300 MG capsule Take 1 capsule (300 mg total) by mouth 2 (two) times daily. 60 capsule 0  . hydrOXYzine (ATARAX/VISTARIL) 25 MG tablet Take 1 tablet (25 mg total) by mouth every 6 (six) hours as needed for anxiety. 30 tablet 0  . hydrOXYzine (VISTARIL) 25 MG capsule Take 25-50 mg by mouth at bedtime as needed (for anxiety or sleep).   1  . lisinopril (PRINIVIL,ZESTRIL) 20 MG tablet Take 1 tablet (20 mg total) by mouth 2 (two) times daily. 180 tablet 3  . metoprolol tartrate (LOPRESSOR) 50 MG tablet Take 25 mg by mouth 2 (two) times daily.  3  . nitroGLYCERIN (NITROSTAT) 0.4 MG SL tablet Place 1 tablet (0.4 mg total) under the tongue every 5 (five) minutes as needed for chest pain. 25 tablet 1  . pantoprazole (PROTONIX) 40 MG tablet Take 1 tablet (40 mg total) by mouth 2 (two) times daily. 30 tablet 6  . prasugrel (EFFIENT) 10 MG TABS tablet Take 1 tablet (10 mg total) by mouth daily. 30 tablet 5  . sertraline (ZOLOFT) 50 MG tablet Take 1 tablet (50 mg  total) by mouth daily. For mood control (Patient taking differently: Take 50 mg by mouth daily. ) 30 tablet 0  . silver sulfADIAZINE (SILVADENE) 1 % cream Apply 1 application topically daily as needed (as directed/for irritation).     . tamoxifen (NOLVADEX) 20 MG tablet Take 1 tablet (20 mg total) by mouth daily. 90 tablet 3  . traZODone (DESYREL) 50 MG tablet Take 1 tablet (50 mg total) by mouth at bedtime as needed for sleep. 30 tablet 0   No current facility-administered medications for this visit.    Facility-Administered Medications Ordered in Other Visits  Medication Dose Route Frequency Provider Last Rate Last Dose  . gadopentetate dimeglumine (MAGNEVIST) injection 12 mL  12 mL Intravenous Once PRN Sater, Richard A, MD      . gadopentetate dimeglumine (MAGNEVIST) injection 16 mL  16 mL Intravenous Once PRN Sater, Nanine Means, MD        PHYSICAL EXAMINATION: ECOG PERFORMANCE STATUS: 1 - Symptomatic but completely ambulatory  Vitals:   10/03/17  1422  BP: 138/81  Pulse: (!) 59  Resp: 17  Temp: 98.1 F (36.7 C)  SpO2: 100%   Filed Weights   10/03/17 1422  Weight: 138 lb 14.4 oz (63 kg)    GENERAL:alert, no distress and comfortable SKIN: skin color, texture, turgor are normal, no rashes or significant lesions EYES: normal, Conjunctiva are pink and non-injected, sclera clear OROPHARYNX:no exudate, no erythema and lips, buccal mucosa, and tongue normal  NECK: supple, thyroid normal size, non-tender, without nodularity LYMPH:  no palpable lymphadenopathy in the cervical, axillary or inguinal LUNGS: clear to auscultation and percussion with normal breathing effort HEART: regular rate & rhythm and no murmurs and no lower extremity edema ABDOMEN:abdomen soft, non-tender and normal bowel sounds MUSCULOSKELETAL:no cyanosis of digits and no clubbing  NEURO: alert & oriented x 3 with fluent speech, no focal motor/sensory deficits EXTREMITIES: No lower extremity edema BREAST: Radiation  dermatitis (exam performed in the presence of a chaperone)  LABORATORY DATA:  I have reviewed the data as listed   Chemistry      Component Value Date/Time   NA 132 (L) 09/26/2017 1333   NA 141 06/20/2017 0858   NA 139 04/04/2017 0835   K 3.5 09/26/2017 1333   K 3.6 04/04/2017 0835   CL 103 09/26/2017 1333   CO2 20 (L) 09/26/2017 1333   CO2 25 04/04/2017 0835   BUN 11 09/26/2017 1333   BUN 13 06/20/2017 0858   BUN 13.9 04/04/2017 0835   CREATININE 1.09 (H) 09/26/2017 1333   CREATININE 1.3 (H) 04/04/2017 0835      Component Value Date/Time   CALCIUM 9.1 09/26/2017 1333   CALCIUM 10.0 04/04/2017 0835   ALKPHOS 49 09/26/2017 1810   ALKPHOS 69 04/04/2017 0835   AST 22 09/26/2017 1810   AST 23 04/04/2017 0835   ALT 15 09/26/2017 1810   ALT 22 04/04/2017 0835   BILITOT 0.5 09/26/2017 1810   BILITOT <0.22 04/04/2017 0835       Lab Results  Component Value Date   WBC 11.8 (H) 09/26/2017   HGB 10.2 (L) 09/26/2017   HCT 31.3 (L) 09/26/2017   MCV 80.3 09/26/2017   PLT 475 (H) 09/26/2017   NEUTROABS 6.0 04/04/2017    ASSESSMENT & PLAN:  Malignant neoplasm of lower-outer quadrant of right breast of female, estrogen receptor positive ([place]) 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  She does not have any hot flashes or muscle aches or pains.  Since she is tolerating extremely well, she can be seen by [place] in 6 months for survivorship care plan visit  I spent 25 minutes talking to the patient of which more than half was spent in counseling and coordination of care.  No orders of the  defined types were placed in this encounter.  The patient has a good understanding of the overall plan. she agrees with it. she will call with any problems that may develop before the next visit here.   Harriette Ohara, MD 10/03/17

## 2017-10-03 NOTE — Telephone Encounter (Signed)
Patient declined AVS. Patient scheduled June 2019. Patient will receive updated schedule in MyChart.

## 2017-10-31 ENCOUNTER — Ambulatory Visit (HOSPITAL_COMMUNITY): Payer: Self-pay | Admitting: Psychiatry

## 2017-11-03 ENCOUNTER — Other Ambulatory Visit: Payer: Self-pay | Admitting: Internal Medicine

## 2017-11-05 NOTE — Telephone Encounter (Signed)
Rx request sent to pharmacy.  

## 2017-12-03 ENCOUNTER — Other Ambulatory Visit: Payer: Self-pay | Admitting: Internal Medicine

## 2017-12-03 DIAGNOSIS — E785 Hyperlipidemia, unspecified: Secondary | ICD-10-CM

## 2017-12-12 ENCOUNTER — Other Ambulatory Visit: Payer: Self-pay | Admitting: Internal Medicine

## 2017-12-21 ENCOUNTER — Ambulatory Visit: Payer: Federal, State, Local not specified - PPO | Admitting: Internal Medicine

## 2017-12-21 ENCOUNTER — Encounter: Payer: Self-pay | Admitting: Internal Medicine

## 2017-12-21 VITALS — BP 142/78 | HR 72 | Ht 62.0 in | Wt 152.8 lb

## 2017-12-21 DIAGNOSIS — I2510 Atherosclerotic heart disease of native coronary artery without angina pectoris: Secondary | ICD-10-CM

## 2017-12-21 DIAGNOSIS — E785 Hyperlipidemia, unspecified: Secondary | ICD-10-CM

## 2017-12-21 DIAGNOSIS — I10 Essential (primary) hypertension: Secondary | ICD-10-CM | POA: Diagnosis not present

## 2017-12-21 NOTE — Patient Instructions (Signed)
Your physician has recommended you make the following change in your medication: STOP Effient  Your physician recommends that you return for lab work FASTING to check cholesterol   Your physician wants you to follow-up in: 6 months with Dr. Debara Pickett. You will receive a reminder letter in the mail two months in advance. If you don't receive a letter, please call our office to schedule the follow-up appointment.

## 2017-12-21 NOTE — Progress Notes (Signed)
12/21/2017   PCP: Wardell Honour, MD  CC: Follow-up coronary disease  HPI:  47 year old Female hx of hypertension, dyslipidemia, chronic tobacco use, chronic kidney disease stage III admitted with hypertensive urgency 09/13/14 and 6/10 chest pain EKG showed lateral T-wave inversions on admission and cardiology was consulted. It was felt she had unstable angina and she was placed on nitro drip.  Cardiac catheterization with 2.25X 12 mm Promus remainder DES to the M RCA on 09/14/14 for 90% stenosis. EF noted 65- 70%.  She did well and discharged without complication.    She was on numerous BP meds and her BP was running in the 90s to 106 and she felt very weak, lethargic.  The hydralazine was stopped and the cardizem.  She is feeling better.  She has occasional brief chest pressure feeling like the discomfort she was admitted with.  She is here today for follow-up. She is actually doing quite well her questions we discussed where the pneumonia vaccine she is only 89 and essentially in good health told her that was not needed unless she wanted it she does not want a flu vaccine she's had side effects in the past and refuses to take it. She is scheduled for sleep study January 29 2 [facility]. She was recently seen by her primary care for cold symptoms and was placed on Zithromax.  She is feeling better.    She is asking about exercise which I recommended she could go back to to start slowly but work only a peripheral 1-2 weeks. She did not have a heart attack and her EF is normal.  Her mother was with her today who also has stents but the mother is or bare-metal stents. We discussed the difference between the 2 and the need for Plavix with both, and the length of time she would need to take the Plavix.  She is no longer smoking. She is using the NicoDerm.  I saw Anna Henson back today in the office for follow-up. She denies any chest pain or worsening shortness of breath. She seems to be  stable from a cardiac standpoint after her stent. She does however have an acute complaint. She tells me that about 2 weeks ago she had a viral upper respiratory infection and had a lot of discharge and pus. She has sore throat associated with this. Subsequently she felt a pop on the right side of her head. After that she had pain and numbness on the right side of her face on her right shoulder and down to the right upper arm and right upper back. She's also had difficulty swallowing due to pain particularly in the right side of her throat as well as some mild drooling. She denies any facial droop, right sided weakness, loss of consciousness, change in vision or other associated symptoms. She denies any headache. Blood pressure has been well controlled and is at goal today at 122/76. She was seen in urgent care and treated for acute pansinusitis by Dr. Elder Cyphers and given amoxicillin which she completed. Lab showed no elevated white count, normal renal function, her cholesterol profile was well controlled, and she had a low ESR 15  (which would likely exclude temporal arteritis).  02/23/2017  Anna Henson returns today for follow-up. Unfortunately she suffered a non-ST elevation MI in March 2018. She was found to have total occlusion of the RCA treated with drug-eluting stents. There was some mild nonobstructive disease of the LAD and circumflex and some  mild LV systolic dysfunction with EF 50-55%. She reports doing much better since discharge. She denies any recurrent chest pain but does get short of breath and some fatigue with exertion. She started cardiac rehabilitation but found that it worsened her chronic back pain and now she's discontinued that. She recently saw Ignacia Bayley, NP, in follow-up. She was felt to be doing well although reported having some tachycardia palpitations when awakening from sleep in the middle the night. He was concerned about arrhythmias and placed a monitor. Her monitor showed normal  sinus rhythm without any arrhythmias. She since discontinued that. She is on Brilinta but does not report that her shortness of breath seems to be related to the doses. I advised that she can consider some caffeine after the medication to see if it attenuates some of the symptoms.  05/16/2017  Anna Henson returns today for follow-up. Recently she's noted that her blood pressures been running much higher although was well controlled in the hospital. She denies any chest pain although has had some mild shortness of breath and occasional sharp chest discomfort. Blood pressure is notably been elevated with systolics in the 852D and diastolics over 782U. Yesterday she was advised to take an additional lisinopril which brought her systolic blood pressure down to the 150s. Typically her blood pressures are elevated more in the evening per her report and in the morning. She was present on a diuretic was taken off of that however due to low blood pressure. Unfortunate, she was recently diagnosed with breast cancer and will need a surgery most likely in September. This will be delayed as she is on dual antiplatelet therapy given her recent stent in March 2018.   09/07/2017  Anna Henson of returns today for follow-up.  She underwent surgery for breast cancer in September.  She was off of her Effient for that procedure and then restarted it.  Since then she has been undergoing radiation therapy.  She seems to be doing fairly well with this.  She denies any recurrent chest pain or worsening shortness of breath.  Lipid profile in September 2018 showed total cholesterol 120, triglycerides 70, HDL 37 and LDL 69.  12/21/2017  Anna Henson returns today for follow-up of her coronary disease.  She underwent PCI in March 2018.  Is now been a year since then.  She is on dual antiplatelet therapy with aspirin and Effient.  She reports ongoing chest wall pain which is worse with breathing.  The pain is described as sharp and occurs on a  daily basis.  Is not necessarily worsened.  This is different pain than she had with her most recent PCI.  There is concern this may be related to chest wall radiation.  Current Outpatient Medications  Medication Sig Dispense Refill  . acetaminophen (TYLENOL) 325 MG tablet Take 325-650 mg by mouth every 8 (eight) hours as needed (for pain or cramping).     Marland Kitchen amLODipine (NORVASC) 5 MG tablet Take 1 tablet (5 mg total) by mouth daily. 30 tablet 11  . ARIPiprazole (ABILIFY) 10 MG tablet Take 1 tablet (10 mg total) by mouth daily. For mood control (Patient taking differently: Take 10 mg by mouth daily. ) 30 tablet 0  . aspirin 81 MG chewable tablet Chew 1 tablet (81 mg total) by mouth daily.    Marland Kitchen atorvastatin (LIPITOR) 80 MG tablet TAKE 1 TABLET(80 MG) BY MOUTH DAILY 90 tablet 0  . baclofen (LIORESAL) 10 MG tablet Take up to 4/day for muscle spasms (  Patient taking differently: Take 10-20 mg by mouth See admin instructions. 10 mg in the morning then 10 mg in the afternoon then 20 mg at bedtime) 120 each 11  . clonazePAM (KLONOPIN) 1 MG tablet Take 0.5 mg by mouth every evening.  5  . folic acid (FOLVITE) 1 MG tablet Take 1 mg by mouth daily.    . furosemide (LASIX) 20 MG tablet TAKE 1 TABLET(20 MG) BY MOUTH DAILY AS NEEDED (Patient taking differently: Take 20 mg by mouth once a day as needed for obvious swelling) 10 tablet 0  . gabapentin (NEURONTIN) 300 MG capsule Take 1 capsule (300 mg total) by mouth 2 (two) times daily. 60 capsule 0  . hydrOXYzine (ATARAX/VISTARIL) 25 MG tablet Take 1 tablet (25 mg total) by mouth every 6 (six) hours as needed for anxiety. 30 tablet 0  . hydrOXYzine (VISTARIL) 25 MG capsule Take 25-50 mg by mouth at bedtime as needed (for anxiety or sleep).   1  . lisinopril (PRINIVIL,ZESTRIL) 10 MG tablet TAKE 2 TABLETS( 20 MG) BY MOUTH EVERY MORNING AND 1 TABLET( 10 MG) EVERY EVENING 90 tablet 0  . lisinopril (PRINIVIL,ZESTRIL) 10 MG tablet Take 10 mg by mouth 2 (two) times daily.     . metoprolol tartrate (LOPRESSOR) 50 MG tablet Take 25 mg by mouth 2 (two) times daily.  3  . nitroGLYCERIN (NITROSTAT) 0.4 MG SL tablet Place 0.4 mg under the tongue every 5 (five) minutes as needed for chest pain.    . pantoprazole (PROTONIX) 40 MG tablet Take 1 tablet (40 mg total) by mouth 2 (two) times daily. 30 tablet 6  . prasugrel (EFFIENT) 10 MG TABS tablet Take 1 tablet (10 mg total) by mouth daily. 30 tablet 5  . sertraline (ZOLOFT) 50 MG tablet Take 1 tablet (50 mg total) by mouth daily. For mood control (Patient taking differently: Take 50 mg by mouth daily. ) 30 tablet 0  . tamoxifen (NOLVADEX) 20 MG tablet Take 1 tablet (20 mg total) by mouth daily. 90 tablet 3  . traZODone (DESYREL) 50 MG tablet Take 1 tablet (50 mg total) by mouth at bedtime as needed for sleep. 30 tablet 0   No current facility-administered medications for this visit.    Facility-Administered Medications Ordered in Other Visits  Medication Dose Route Frequency Provider Last Rate Last Dose  . gadopentetate dimeglumine (MAGNEVIST) injection 12 mL  12 mL Intravenous Once PRN Sater, Richard A, MD      . gadopentetate dimeglumine (MAGNEVIST) injection 16 mL  16 mL Intravenous Once PRN Sater, Nanine Means, MD        Past Medical History:  Diagnosis Date  . Anxiety   . Bipolar disorder ([place])   . Breast cancer, right breast ([place]) ~ 03/2017  . CAD in native artery    a. 09/2014 Cath/PCI in setting of [place] - s/p 2.25 x 12 mm Promus Premier DES to the RCA;  b. 11/2016 Myoview: EF 56%, no ischemia;  c. 12/2016 NSTEMI/PCI: LM nl, LAd 25p, LCX 30p, RCA 100p (2.75x38 Promus Premier DES), mRCA 10 ISR, EF 50-55%.  . Chronic kidney disease    she sthinks has stage 2 CKD, has had US doppler and has some stenosis , this was done in [place]   . CKD (chronic kidney disease), stage II   . Depression   . Dysrhythmia    Tachycardia in evenings  . Genetic testing 04/23/2017   Anna. Sanmiguel underwent genetic counseling and testing  for hereditary  cancer syndromes on 04/05/2017. Her results were negative for mutations in all 46 genes analyzed by [facility]'s 46-gene Common Hereditary Cancers Panel. Genes analyzed include: APC, ATM, AXIN2, BARD1, BMPR1A, BRCA1, BRCA2, BRIP1, CDH1, CDKN2A, CHEK2, CTNNA1, DICER1, EPCAM, GREM1, HOXB13, KIT, MEN1, MLH1, MSH2, MSH3, MSH6, MUTYH, NBN,  . GERD (gastroesophageal reflux disease)   . Headache    "bi-weekly" (05/07/2017)  . Heart attack ([place]) 12/31/2016  . HOCM (hypertrophic obstructive cardiomyopathy) ([place])    a. 12/2016 Echo: EF 65-70%,  mod conc LVH, dynamic obstruction @ rest, peak velocity of 291 cm/sec w/ peak gradient of 69mHg, no rwma, Gr1 DD, triv TR, PASP 140mg.  . Marland Kitchenyperlipidemia   . Hypertension    > 5 years  . Migraine    "probably 1/month" (05/07/2017)  . Palpitations   . Pneumonia    "twice when I was a child" (05/07/2017)  . Tobacco abuse 09/13/2014  . Transverse myelitis ([place]    Past Surgical History:  Procedure Laterality Date  . ABDOMINAL HERNIA REPAIR  ~ 2001; ~ 2005  . BREAST BIOPSY Right ~ 03/2017 X 2  . BREAST LUMPECTOMY WITH RADIOACTIVE SEED AND SENTINEL LYMPH NODE BIOPSY Right 06/21/2017   Procedure: RIGHT BREAST BRACKETED SEED GUIDED LUMPECTOMY AND SENTINEL LYMPH NODE BIOPSY;  Surgeon: ToJovita KussmaulMD;  [facility] Service: General;  Laterality: Right;  . CORONARY ANGIOPLASTY WITH STENT PLACEMENT     L main OK, LAD OK, CFX 30%, RCA 90%-0% w/ 2.25 x 12 mm Promus Premier DES, EF 55%  . CORONARY/GRAFT ACUTE MI REVASCULARIZATION N/A 12/31/2016   Procedure: Coronary/Graft Acute MI Revascularization;  Surgeon: MiSherren MochaMD;  [facility];  Service: Cardiovascular;  Laterality: N/A;  . HERNIA REPAIR     times 2  umbilical  . LEFT HEART CATH AND CORONARY ANGIOGRAPHY N/A 12/31/2016   Procedure: Left Heart Cath and Coronary Angiography;  Surgeon: MiSherren MochaMD;  [facility];  Service: Cardiovascular;   Laterality: N/A;  . LEFT HEART CATHETERIZATION WITH CORONARY ANGIOGRAM N/A 09/14/2014   Procedure: LEFT HEART CATHETERIZATION WITH CORONARY ANGIOGRAM;  Surgeon: ChBurnell BlanksMD;  [facility];  Service: Cardiovascular;  Laterality: N/A;  . PERCUTANEOUS CORONARY STENT INTERVENTION (PCI-S)  09/14/2014   Procedure: PERCUTANEOUS CORONARY STENT INTERVENTION (PCI-S);  Surgeon: ChBurnell BlanksMD;  [facility];  Service: Cardiovascular;;  . TONSILLECTOMY  2005    ROS: Pertinent items noted in HPI and remainder of comprehensive ROS otherwise negative.  Wt Readings from Last 3 Encounters:  12/21/17 152 lb 12.8 oz (69.3 kg)  10/03/17 138 lb 14.4 oz (63 kg)  09/27/17 136 lb 4.8 oz (61.8 kg)    PHYSICAL EXAM BP (!) 142/78   Pulse 72   Ht 5' 2" (1.575 m)   Wt 152 lb 12.8 oz (69.3 kg)   BMI 27.95 kg/m  General appearance: alert and no distress Neck: no carotid bruit and no JVD Lungs: clear to auscultation bilaterally Heart: regular rate and rhythm, S1, S2 normal, no murmur, click, rub or gallop Abdomen: soft, non-tender; bowel sounds normal; no masses,  no organomegaly Extremities: extremities normal, atraumatic, no cyanosis or edema Pulses: 2+ and symmetric Skin: Skin color, texture, turgor normal. No rashes or lesions Neurologic: Grossly normal Psych: pleasant  EKG:  Normal sinus rhythm at 72-personally reviewed  ASSESSMENT: 1. Recent NSTEMI - s/p repeat PCI to the RCA 12/2016 - DES 2. Coronary artery disease status post PCI to  the RCA in 2015 3. Dyslipidemia-at goal 4. Hypertension 5. DOE 6. Breast cancer   PLAN: Anna Henson is describing a constant, sharp chest discomfort which is noted almost on a daily basis.  I doubt this is a coronary artery pain.  I suspect it may be more related to chest wall radiation or other etiologies.  She does have neuropathy.  It has been just over a year since she had her drug-eluting stent placement.  At this point  I feel that she can discontinue Effient and continue on aspirin.  She is to monitor her chest pain and if it were to worsen, particularly with exertion and be responsive to nitrates, then I would recommend further workup.  We will plan to check a lipid profile fasting as well.  Follow-up with me in 6 months or sooner as necessary.  Pixie Casino, MD, [facility], [place] Director of the [facility]  [facility] Attending Cardiologist  Direct Dial: [phone]  Fax: [phone]  Website:  [email or web address]

## 2018-02-20 ENCOUNTER — Ambulatory Visit: Payer: Federal, State, Local not specified - PPO | Admitting: Neurology

## 2018-02-27 ENCOUNTER — Other Ambulatory Visit: Payer: Self-pay | Admitting: Internal Medicine

## 2018-02-27 DIAGNOSIS — E785 Hyperlipidemia, unspecified: Secondary | ICD-10-CM

## 2018-02-28 ENCOUNTER — Encounter: Payer: Self-pay | Admitting: Family Medicine

## 2018-03-18 ENCOUNTER — Emergency Department (HOSPITAL_COMMUNITY): Payer: No Typology Code available for payment source

## 2018-03-18 ENCOUNTER — Encounter (HOSPITAL_COMMUNITY): Payer: Self-pay | Admitting: Emergency Medicine

## 2018-03-18 ENCOUNTER — Emergency Department (HOSPITAL_COMMUNITY)
Admission: EM | Admit: 2018-03-18 | Discharge: 2018-03-18 | Disposition: A | Discharge status: Home / Self Care | Payer: No Typology Code available for payment source | Attending: Emergency Medicine | Admitting: Emergency Medicine

## 2018-03-18 ENCOUNTER — Other Ambulatory Visit: Payer: Self-pay

## 2018-03-18 DIAGNOSIS — Z170 Estrogen receptor positive status [ER+]: Secondary | ICD-10-CM | POA: Insufficient documentation

## 2018-03-18 DIAGNOSIS — I25110 Atherosclerotic heart disease of native coronary artery with unstable angina pectoris: Secondary | ICD-10-CM | POA: Insufficient documentation

## 2018-03-18 DIAGNOSIS — Z87891 Personal history of nicotine dependence: Secondary | ICD-10-CM | POA: Insufficient documentation

## 2018-03-18 DIAGNOSIS — Z79899 Other long term (current) drug therapy: Secondary | ICD-10-CM | POA: Diagnosis not present

## 2018-03-18 DIAGNOSIS — C50511 Malignant neoplasm of lower-outer quadrant of right female breast: Secondary | ICD-10-CM | POA: Insufficient documentation

## 2018-03-18 DIAGNOSIS — I129 Hypertensive chronic kidney disease with stage 1 through stage 4 chronic kidney disease, or unspecified chronic kidney disease: Secondary | ICD-10-CM | POA: Diagnosis not present

## 2018-03-18 DIAGNOSIS — R1013 Epigastric pain: Secondary | ICD-10-CM | POA: Insufficient documentation

## 2018-03-18 DIAGNOSIS — Z7982 Long term (current) use of aspirin: Secondary | ICD-10-CM | POA: Diagnosis not present

## 2018-03-18 DIAGNOSIS — N182 Chronic kidney disease, stage 2 (mild): Secondary | ICD-10-CM | POA: Diagnosis not present

## 2018-03-18 DIAGNOSIS — R1010 Upper abdominal pain, unspecified: Secondary | ICD-10-CM | POA: Diagnosis present

## 2018-03-18 LAB — CBC
HCT: 32.7 % — ABNORMAL LOW (ref 36.0–46.0)
Hemoglobin: 10.2 g/dL — ABNORMAL LOW (ref 12.0–15.0)
MCH: 25.4 pg — ABNORMAL LOW (ref 26.0–34.0)
MCHC: 31.2 g/dL (ref 30.0–36.0)
MCV: 81.3 fL (ref 78.0–100.0)
Platelets: 378 10*3/uL (ref 150–400)
RBC: 4.02 MIL/uL (ref 3.87–5.11)
RDW: 18.5 % — ABNORMAL HIGH (ref 11.5–15.5)
WBC: 10 10*3/uL (ref 4.0–10.5)

## 2018-03-18 LAB — COMPREHENSIVE METABOLIC PANEL
ALT: 16 U/L (ref 14–54)
AST: 21 U/L (ref 15–41)
Albumin: 3.5 g/dL (ref 3.5–5.0)
Alkaline Phosphatase: 61 U/L (ref 38–126)
Anion gap: 6 (ref 5–15)
BUN: 14 mg/dL (ref 6–20)
CO2: 26 mmol/L (ref 22–32)
Calcium: 9 mg/dL (ref 8.9–10.3)
Chloride: 110 mmol/L (ref 101–111)
Creatinine, Ser: 1.24 mg/dL — ABNORMAL HIGH (ref 0.44–1.00)
GFR calc Af Amer: 59 mL/min — ABNORMAL LOW (ref >60)
GFR calc non Af Amer: 51 mL/min — ABNORMAL LOW (ref >60)
Glucose, Bld: 116 mg/dL — ABNORMAL HIGH (ref 65–99)
Potassium: 4 mmol/L (ref 3.5–5.1)
Sodium: 142 mmol/L (ref 135–145)
Total Bilirubin: 0.5 mg/dL (ref 0.3–1.2)
Total Protein: 6.2 g/dL — ABNORMAL LOW (ref 6.5–8.1)

## 2018-03-18 LAB — URINALYSIS, ROUTINE W REFLEX MICROSCOPIC
Bilirubin Urine: NEGATIVE
Glucose, UA: NEGATIVE mg/dL
Hgb urine dipstick: NEGATIVE
Ketones, ur: NEGATIVE mg/dL
Leukocytes, UA: NEGATIVE
Nitrite: NEGATIVE
Protein, ur: NEGATIVE mg/dL
Specific Gravity, Urine: 1.015 (ref 1.005–1.030)
pH: 6 (ref 5.0–8.0)

## 2018-03-18 LAB — LIPASE, BLOOD: Lipase: 36 U/L (ref 11–51)

## 2018-03-18 LAB — I-STAT BETA HCG BLOOD, ED (MC, WL, AP ONLY): I-stat hCG, quantitative: <5 m[IU]/mL (ref <5)

## 2018-03-18 LAB — I-STAT TROPONIN, ED: Troponin i, poc: 0 ng/mL (ref 0.00–0.08)

## 2018-03-18 MED ORDER — IOHEXOL 300 MG/ML  SOLN
100.0000 mL | Freq: Once | INTRAMUSCULAR | Status: AC | PRN
  Administered 2018-03-18: 100 mL via INTRAVENOUS

## 2018-03-18 MED ORDER — PANTOPRAZOLE SODIUM 40 MG PO TBEC: 40.0000 mg | DELAYED_RELEASE_TABLET | Freq: Every day | ORAL | 0 refills | Status: DC

## 2018-03-18 MED ORDER — MORPHINE SULFATE (PF) 4 MG/ML IV SOLN
4.0000 mg | Freq: Once | INTRAVENOUS | Status: AC
  Administered 2018-03-18: 4 mg via INTRAVENOUS
  Filled 2018-03-18: qty 1

## 2018-03-18 MED ORDER — GI COCKTAIL ~~LOC~~
30.0000 mL | Freq: Once | ORAL | Status: AC
  Administered 2018-03-18: 30 mL via ORAL
  Filled 2018-03-18: qty 30

## 2018-03-18 NOTE — ED Notes (Signed)
Patient transported to CT 

## 2018-03-18 NOTE — ED Triage Notes (Signed)
Pt. Stated, Anna Henson had stomach pain that started a month ago. No other symptoms

## 2018-03-18 NOTE — ED Notes (Signed)
ED Provider at bedside. 

## 2018-03-18 NOTE — ED Notes (Signed)
Patient ambulatory to bathroom with steady gait at this time 

## 2018-03-18 NOTE — ED Provider Notes (Signed)
 [facility] EMERGENCY DEPARTMENT Provider Note   CSN: [id number] Arrival date & time: 03/18/18  0907     History   Chief Complaint Chief Complaint  Patient presents with  . Abdominal Pain    HPI Anna Henson is a 47 y.o. female.  The history is provided by the patient and medical records. No language interpreter was used.  Abdominal Pain   Associated symptoms include nausea. Pertinent negatives include diarrhea, vomiting and constipation.   Anna Henson is a 47 y.o. female  with a PMH of CAD, GERD, previous right breast cx currently on tamoxifen, prior umbilical hernia repair x2 who presents to the Emergency Department complaining of persistent, intermittent upper abdominal pain which began about a month ago and has been progressively getting worse.  She notes feeling as if her upper abdomen is much more swollen over the last week or so as well.  She has felt nauseous, although does not currently, but has had no emesis.  No bowel changes, urinary symptoms, fever, chills, back pain, chest pain or shortness of breath.  She has been taking Tylenol with no improvement in her symptoms.  Denies history of similar.   Past Medical History:  Diagnosis Date  . Anxiety   . Bipolar disorder (HCC)   . Breast cancer, right breast (HCC) ~ 03/2017  . CAD in native artery    a. 09/2014 Cath/PCI in setting of [place] - s/p 2.25 x 12 mm Promus Premier DES to the RCA;  b. 11/2016 Myoview: EF 56%, no ischemia;  c. 12/2016 NSTEMI/PCI: LM nl, LAd 25p, LCX 30p, RCA 100p (2.75x38 Promus Premier DES), mRCA 10 ISR, EF 50-55%.  . Chronic kidney disease    she sthinks has stage 2 CKD, has had US doppler and has some stenosis , this was done in [place]   . CKD (chronic kidney disease), stage II   . Depression   . Dysrhythmia    Tachycardia in evenings  . Genetic testing 04/23/2017   Ms. Piccininni underwent genetic counseling and testing for hereditary cancer syndromes on 04/05/2017. Her results  were negative for mutations in all 46 genes analyzed by [employer]'s 46-gene Common Hereditary Cancers Panel. Genes analyzed include: APC, ATM, AXIN2, BARD1, BMPR1A, BRCA1, BRCA2, BRIP1, CDH1, CDKN2A, CHEK2, CTNNA1, DICER1, EPCAM, GREM1, HOXB13, KIT, MEN1, MLH1, MSH2, MSH3, MSH6, MUTYH, NBN,  . GERD (gastroesophageal reflux disease)   . Headache    "bi-weekly" (05/07/2017)  . Heart attack (HCC) 12/31/2016  . HOCM (hypertrophic obstructive cardiomyopathy) (HCC)    a. 12/2016 Echo: EF 65-70%,  mod conc LVH, dynamic obstruction @ rest, peak velocity of 291 cm/sec w/ peak gradient of 34mmHg, no rwma, Gr1 DD, triv TR, PASP 15mmHg.  . Hyperlipidemia   . Hypertension    > 5 years  . Migraine    "probably 1/month" (05/07/2017)  . Palpitations   . Pneumonia    "twice when I was a child" (05/07/2017)  . Tobacco abuse 09/13/2014  . Transverse myelitis (HCC) 2017    Patient Active Problem List   Diagnosis Date Noted  . Chronic anemia   . MDD (major depressive disorder), recurrent episode, severe (HCC) 08/28/2017  . Neck stiffness 08/22/2017  . Abnormal finding on MRI of brain 08/22/2017  . HOCM (hypertrophic obstructive cardiomyopathy) (HCC)   . Chest pain 05/06/2017  . Genetic testing 04/23/2017  . Malignant neoplasm of lower-outer quadrant of right breast of female, estrogen receptor positive (HCC) 03/29/2017  . Dysesthesia 03/12/2017  . Palpitations   02/23/2017  . S/P cardiac cath (12/2016) a. acute total occlusion of the RCA tx w/PCI DES, b. mild nonobs LAD/LCx stenosis, c. mild segmental LV sys dx w/ sev hypokinesis, LVEF 50-55% 01/02/2017  . Non-ST elevation myocardial infarction (NSTEMI), subsequent episode of care (HCC) 12/31/2016  . Malignant hypertension   . Chronic kidney disease   . ST elevation myocardial infarction involving right coronary artery (HCC)   . Abnormal EKG   . Nausea   . Ischemic cardiomyopathy   . Muscle spasms of both lower extremities 04/17/2016  . Gait disturbance  01/25/2016  . Transverse myelitis (HCC) 12/14/2015  . Acute-on-chronic kidney injury (HCC) 11/12/2015  . Neck pain 11/12/2015  . Hypokalemia 11/12/2015  . Abnormal MRI, neck 11/12/2015  . Numbness 11/12/2015  . Hypertension   . Hyperlipidemia   . Coronary artery disease involving native coronary artery of native heart without angina pectoris   . Facial numbness   . Right arm numbness   . Throat pain 11/09/2015  . Drooling 11/09/2015  . CAD (coronary artery disease), native coronary artery 09/15/2014  . Headache 09/15/2014  . UTI (urinary tract infection): Probable 09/14/2014  . Candida infection of genital region 09/14/2014  . Unstable angina (HCC) 09/13/2014  . HLD (hyperlipidemia) 09/13/2014  . Otitis 09/13/2014  . ACS (acute coronary syndrome) (HCC) 09/13/2014  . Tobacco abuse 09/13/2014  . Essential hypertension 09/09/2014    Past Surgical History:  Procedure Laterality Date  . ABDOMINAL HERNIA REPAIR  ~ 2001; ~ 2005  . BREAST BIOPSY Right ~ 03/2017 X 2  . BREAST LUMPECTOMY WITH RADIOACTIVE SEED AND SENTINEL LYMPH NODE BIOPSY Right 06/21/2017   Procedure: RIGHT BREAST BRACKETED SEED GUIDED LUMPECTOMY AND SENTINEL LYMPH NODE BIOPSY;  Surgeon: Toth, Paul III, MD;  Location: [facility] OR;  Service: General;  Laterality: Right;  . CORONARY ANGIOPLASTY WITH STENT PLACEMENT     L main OK, LAD OK, CFX 30%, RCA 90%-0% w/ 2.25 x 12 mm Promus Premier DES, EF 55%  . CORONARY/GRAFT ACUTE MI REVASCULARIZATION N/A 12/31/2016   Procedure: Coronary/Graft Acute MI Revascularization;  Surgeon: Michael Cooper, MD;  Location: [facility] INVASIVE CV LAB;  Service: Cardiovascular;  Laterality: N/A;  . HERNIA REPAIR     times 2  umbilical  . LEFT HEART CATH AND CORONARY ANGIOGRAPHY N/A 12/31/2016   Procedure: Left Heart Cath and Coronary Angiography;  Surgeon: Michael Cooper, MD;  Location: [facility] INVASIVE CV LAB;  Service: Cardiovascular;  Laterality: N/A;  . LEFT HEART CATHETERIZATION WITH CORONARY ANGIOGRAM N/A  09/14/2014   Procedure: LEFT HEART CATHETERIZATION WITH CORONARY ANGIOGRAM;  Surgeon: Christopher D McAlhany, MD;  Location: [facility] CATH LAB;  Service: Cardiovascular;  Laterality: N/A;  . PERCUTANEOUS CORONARY STENT INTERVENTION (PCI-S)  09/14/2014   Procedure: PERCUTANEOUS CORONARY STENT INTERVENTION (PCI-S);  Surgeon: Christopher D McAlhany, MD;  Location: [facility] CATH LAB;  Service: Cardiovascular;;  . TONSILLECTOMY  2005     OB History   None      Home Medications    Prior to Admission medications   Medication Sig Start Date End Date Taking? Authorizing Provider  acetaminophen (TYLENOL) 325 MG tablet Take 325-650 mg by mouth every 8 (eight) hours as needed (for pain or cramping).    Yes [provider]  amLODipine (NORVASC) 5 MG tablet Take 1 tablet (5 mg total) by mouth daily. 06/17/17 06/17/18 Yes Ingold, Laura R, NP  ARIPiprazole (ABILIFY) 10 MG tablet Take 1 tablet (10 mg total) by mouth daily. For mood control Patient taking differently: Take   10 mg by mouth daily.  09/01/17  Yes Money, Travis B, FNP  aspirin 81 MG chewable tablet Chew 1 tablet (81 mg total) by mouth daily. 09/16/14  Yes Samtani, Jai-Gurmukh, MD  atorvastatin (LIPITOR) 80 MG tablet TAKE 1 TABLET(80 MG) BY MOUTH DAILY Patient taking differently: TAKE 1 TABLET(80 MG) BY MOUTH in the evening 02/28/18  Yes Hilty, Kenneth C, MD  baclofen (LIORESAL) 10 MG tablet Take up to 4/day for muscle spasms Patient taking differently: Take 10-20 mg by mouth See admin instructions. 10 mg in the morning then 10 mg in the afternoon then 20 mg at bedtime 03/12/17  Yes Sater, Richard A, MD  clonazePAM (KLONOPIN) 1 MG tablet Take 0.5 mg by mouth every evening. 08/23/17  Yes [provider]  folic acid (FOLVITE) 1 MG tablet Take 1 mg by mouth daily.   Yes [provider]  furosemide (LASIX) 20 MG tablet TAKE 1 TABLET(20 MG) BY MOUTH DAILY AS NEEDED Patient taking differently: Take 20 mg by mouth once a day as needed for obvious  swelling 05/28/17  Yes Hilty, Kenneth C, MD  gabapentin (NEURONTIN) 300 MG capsule Take 1 capsule (300 mg total) by mouth 2 (two) times daily. 08/31/17  Yes Money, Travis B, FNP  lisinopril (PRINIVIL,ZESTRIL) 10 MG tablet TAKE 2 TABLETS( 20 MG) BY MOUTH EVERY MORNING AND 1 TABLET( 10 MG) EVERY EVENING Patient taking differently: TAKE 2 TABLETS( 20 MG) BY MOUTH  TWICE A DAY 11/05/17  Yes Hilty, Kenneth C, MD  metoprolol tartrate (LOPRESSOR) 50 MG tablet Take 25 mg by mouth 2 (two) times daily. 07/31/17  Yes [provider]  nitroGLYCERIN (NITROSTAT) 0.4 MG SL tablet Place 0.4 mg under the tongue every 5 (five) minutes as needed for chest pain.   Yes [provider]  sertraline (ZOLOFT) 50 MG tablet Take 1 tablet (50 mg total) by mouth daily. For mood control Patient taking differently: Take 50 mg by mouth daily.  09/01/17  Yes Money, Travis B, FNP  tamoxifen (NOLVADEX) 20 MG tablet Take 1 tablet (20 mg total) by mouth daily. 04/04/17  Yes Gudena, Vinay, MD  hydrOXYzine (ATARAX/VISTARIL) 25 MG tablet Take 1 tablet (25 mg total) by mouth every 6 (six) hours as needed for anxiety. Patient not taking: Reported on 03/18/2018 08/31/17   Money, Travis B, FNP  pantoprazole (PROTONIX) 40 MG tablet Take 1 tablet (40 mg total) by mouth daily. 03/18/18   Ward, Jaime Pilcher, PA-C  traZODone (DESYREL) 50 MG tablet Take 1 tablet (50 mg total) by mouth at bedtime as needed for sleep. Patient not taking: Reported on 03/18/2018 08/31/17   Money, Travis B, FNP    Family History Family History  Problem Relation Age of Onset  . Diabetes Mother   . Heart disease Mother   . Hyperlipidemia Mother   . Hypertension Mother   . Lung cancer Father   . Drug abuse Father   . Brain cancer Father 73  . Bone cancer Father 73  . Drug abuse Sister   . Mental illness Brother   . Mental illness Maternal Grandmother   . Stomach cancer Paternal Grandmother 70       d.75    Social History Social History    Tobacco Use  . Smoking status: Former Smoker    Packs/day: 0.50    Years: 30.00    Pack years: 15.00    Types: Cigarettes    Last attempt to quit: 10/05/2015    Years since quitting: 2.4  .   Smokeless tobacco: Never Used  Substance Use Topics  . Alcohol use: Yes    Alcohol/week: 1.2 oz    Types: 2 Cans of beer per week  . Drug use: No     Allergies   Pork-derived products   Review of Systems Review of Systems  Gastrointestinal: Positive for abdominal distention, abdominal pain and nausea. Negative for blood in stool, constipation, diarrhea and vomiting.  All other systems reviewed and are negative.    Physical Exam Updated Vital Signs BP (!) 164/72   Pulse (!) 58   Temp 98.4 F (36.9 C) (Oral)   Resp 13   Ht 5' 2" (1.575 m)   Wt 63.5 kg (140 lb)   LMP 02/17/2018   SpO2 100%   BMI 25.61 kg/m   Physical Exam  Constitutional: She is oriented to person, place, and time. She appears well-developed and well-nourished. No distress.  Nontoxic-appearing.  HENT:  Head: Normocephalic and atraumatic.  Neck: Neck supple. No JVD present.  Cardiovascular: Normal rate, regular rhythm and normal heart sounds.  No murmur heard. Pulmonary/Chest: Effort normal and breath sounds normal. No respiratory distress.  Abdominal: Soft.  Diffuse tenderness to upper abdomen. Negative Murphy's.  Neurological: She is alert and oriented to person, place, and time.  Skin: Skin is warm and dry.  Nursing note and vitals reviewed.    ED Treatments / Results  Labs (all labs ordered are listed, but only abnormal results are displayed) Labs Reviewed  COMPREHENSIVE METABOLIC PANEL - Abnormal; Notable for the following components:      Result Value   Glucose, Bld 116 (*)    Creatinine, Ser 1.24 (*)    Total Protein 6.2 (*)    GFR calc non Af Amer 51 (*)    GFR calc Af Amer 59 (*)    All other components within normal limits  CBC - Abnormal; Notable for the following components:    Hemoglobin 10.2 (*)    HCT 32.7 (*)    MCH 25.4 (*)    RDW 18.5 (*)    All other components within normal limits  LIPASE, BLOOD  URINALYSIS, ROUTINE W REFLEX MICROSCOPIC  I-STAT BETA HCG BLOOD, ED ([facility], [facility], [facility] ONLY)  I-STAT TROPONIN, ED    EKG None  Radiology Ct Abdomen Pelvis W Contrast  Result Date: 03/18/2018 CLINICAL DATA:  Epigastric pain and nausea for 2 weeks. Ventral hernia repair 5 years ago. History of breast cancer with radiation therapy. EXAM: CT ABDOMEN AND PELVIS WITH CONTRAST TECHNIQUE: Multidetector CT imaging of the abdomen and pelvis was performed using the standard protocol following bolus administration of intravenous contrast. CONTRAST:  142m OMNIPAQUE IOHEXOL 300 MG/ML  SOLN COMPARISON:  Overlapping portions of CT chest from 11/12/2015 FINDINGS: Lower chest: There is a suggestion of cutaneous thickening and subcutaneous stranding associated with the right breast on the top most images. Right coronary artery atherosclerotic calcification. Hepatobiliary: 5 by 4 mm hypodense lesion in the left hepatic lobe on image 14/3, technically nonspecific although statistically likely to be benign. Mildly contracted gallbladder. Pancreas: Unremarkable Spleen: Unremarkable Adrenals/Urinary Tract: 6 mm hypodense lesion of the right mid kidney posteriorly on image 28/3, likely a small cyst but technically too small to characterize. Adrenal glands and urinary bladder unremarkable. Stomach/Bowel: Unremarkable.  Appendix normal. Vascular/Lymphatic: Aortoiliac atherosclerotic vascular disease. No pathologic adenopathy. Reproductive: Unremarkable Other: Trace free pelvic fluid in the cul-de-sac. Musculoskeletal: Small ventral hernia mesh noted without a significant recurrent hernia. IMPRESSION: 1. A specific cause for the patient's epigastric  pain and nausea is not identified. 2. Advanced for age atherosclerosis including the abdominal aorta and coronary arteries. Aortic Atherosclerosis  (ICD10-I70.0). 3. Small hypodense lesions in the left hepatic lobe and right kidney are statistically likely to be small benign cysts, but technically nonspecific due to small size. 4. Trace free pelvic fluid in the cul-de-sac may be physiologic. Electronically Signed   By: Walter  Liebkemann M.D.   On: 03/18/2018 12:30    Procedures Procedures (including critical care time)  Medications Ordered in ED Medications  morphine 4 MG/ML injection 4 mg (4 mg Intravenous Given 03/18/18 1030)  iohexol (OMNIPAQUE) 300 MG/ML solution 100 mL (100 mLs Intravenous Contrast Given 03/18/18 1126)  gi cocktail (Maalox,Lidocaine,Donnatal) (30 mLs Oral Given 03/18/18 1252)     Initial Impression / Assessment and Plan / ED Course  I have reviewed the triage vital signs and the nursing notes.  Pertinent labs & imaging results that were available during my care of the patient were reviewed by me and considered in my medical decision making (see chart for details).    Anna Henson is a 47 y.o. female who presents to ED for persistent intermittent upper abdominal pain for the last month. No aggravating factors noted. Patient is nontoxic, nonseptic appearing with a non-surgical abdominal exam. Negative Murphy's sign. Labs and urine reviewed and reassuring. CT scan with no acute findings.  Patient's pain and other symptoms have been adequately managed in the Emergency Department today. Patient does not meet the SIRS or Sepsis criteria. On repeat exam, abdominal exam with no peritoneal signs. No indication of appendicitis, bowel obstruction, bowel perforation, cholecystitis, diverticulitis or ectopic pregnancy. Patient discharged home with symptomatic treatment and encouraged to follow up with GI. I have also discussed reasons to return immediately to the ER. Patient expresses understanding and agrees with plan as dictated above.   Final Clinical Impressions(s) / ED Diagnoses   Final diagnoses:  Epigastric pain    ED  Discharge Orders        Ordered    pantoprazole (PROTONIX) 40 MG tablet  Daily     03/18/18 1243       Ward, Jaime Pilcher, PA-C 03/18/18 1320    Cook, Brian, MD 03/20/18 1051  

## 2018-03-18 NOTE — Discharge Instructions (Signed)
It was my pleasure taking care of you today!   Fortunately, your lab work and imaging was very reassuring.  At this time, there does not appear to be an acute, emergent cause for your abdominal pain. It is still important that you follow up for further discussion of your abdominal pain. Please call the GI clinic listed to schedule a follow up appointment.   Take Protonix as directed. Tylenol as needed for pain.   Return to the Emergency Department if you develop any of the following symptoms:  You have a fever.  You keep throwing up and can't keep fluids down.  You pass bloody or black tarry stools.  There is bright red blood in the stool. New or worsening symptoms develop, any additional concerns.

## 2018-03-18 NOTE — ED Notes (Signed)
Patient verbalizes understanding of discharge instructions. Opportunity for questioning and answers were provided. Armband removed by staff, pt discharged from ED ambulatory with family.  

## 2018-03-27 ENCOUNTER — Telehealth: Payer: Self-pay

## 2018-03-27 ENCOUNTER — Other Ambulatory Visit: Payer: Self-pay | Admitting: Hematology and Oncology

## 2018-03-27 NOTE — Telephone Encounter (Signed)
SCP cancelled for 04/04/18 at 2 pm d/t new job. Pt says she will look at calendar and call back to reschedule.

## 2018-04-01 ENCOUNTER — Ambulatory Visit: Payer: Self-pay

## 2018-04-01 ENCOUNTER — Telehealth: Payer: Self-pay | Admitting: Hematology and Oncology

## 2018-04-01 ENCOUNTER — Other Ambulatory Visit: Payer: Self-pay

## 2018-04-01 ENCOUNTER — Inpatient Hospital Stay
Payer: No Typology Code available for payment source | Attending: Hematology and Oncology | Admitting: Hematology and Oncology

## 2018-04-01 ENCOUNTER — Inpatient Hospital Stay: Payer: No Typology Code available for payment source

## 2018-04-01 ENCOUNTER — Telehealth: Payer: Self-pay | Admitting: *Deleted

## 2018-04-01 VITALS — BP 158/97 | HR 75 | Temp 98.6°F | Resp 20 | Ht 62.0 in | Wt 160.8 lb

## 2018-04-01 DIAGNOSIS — C50511 Malignant neoplasm of lower-outer quadrant of right female breast: Secondary | ICD-10-CM

## 2018-04-01 DIAGNOSIS — Z170 Estrogen receptor positive status [ER+]: Secondary | ICD-10-CM | POA: Diagnosis not present

## 2018-04-01 DIAGNOSIS — I890 Lymphedema, not elsewhere classified: Secondary | ICD-10-CM

## 2018-04-01 DIAGNOSIS — N63 Unspecified lump in unspecified breast: Secondary | ICD-10-CM

## 2018-04-01 LAB — CBC WITH DIFFERENTIAL (CANCER CENTER ONLY)
Basophils Absolute: 0.1 10*3/uL (ref 0.0–0.1)
Basophils Relative: 1 %
Eosinophils Absolute: 0.3 10*3/uL (ref 0.0–0.5)
Eosinophils Relative: 3 %
HCT: 34 % — ABNORMAL LOW (ref 34.8–46.6)
Hemoglobin: 11.1 g/dL — ABNORMAL LOW (ref 11.6–15.9)
Lymphocytes Relative: 20 %
Lymphs Abs: 2.1 10*3/uL (ref 0.9–3.3)
MCH: 26 pg (ref 25.1–34.0)
MCHC: 32.5 g/dL (ref 31.5–36.0)
MCV: 79.9 fL (ref 79.5–101.0)
Monocytes Absolute: 0.8 10*3/uL (ref 0.1–0.9)
Monocytes Relative: 8 %
Neutro Abs: 7.1 10*3/uL — ABNORMAL HIGH (ref 1.5–6.5)
Neutrophils Relative %: 68 %
Platelet Count: 319 10*3/uL (ref 145–400)
RBC: 4.25 MIL/uL (ref 3.70–5.45)
RDW: 20.5 % — ABNORMAL HIGH (ref 11.2–14.5)
WBC Count: 10.4 10*3/uL — ABNORMAL HIGH (ref 3.9–10.3)

## 2018-04-01 LAB — CMP (CANCER CENTER ONLY)
ALT: 11 U/L (ref 0–55)
AST: 18 U/L (ref 5–34)
Albumin: 3.8 g/dL (ref 3.5–5.0)
Alkaline Phosphatase: 72 U/L (ref 40–150)
Anion gap: 7 (ref 3–11)
BUN: 12 mg/dL (ref 7–26)
CO2: 24 mmol/L (ref 22–29)
Calcium: 9.2 mg/dL (ref 8.4–10.4)
Chloride: 110 mmol/L — ABNORMAL HIGH (ref 98–109)
Creatinine: 1.19 mg/dL — ABNORMAL HIGH (ref 0.60–1.10)
GFR, Est AFR Am: >60 mL/min (ref >60)
GFR, Estimated: 54 mL/min — ABNORMAL LOW (ref >60)
Glucose, Bld: 100 mg/dL (ref 70–140)
Potassium: 4.1 mmol/L (ref 3.5–5.1)
Sodium: 141 mmol/L (ref 136–145)
Total Bilirubin: <0.2 mg/dL — ABNORMAL LOW (ref 0.2–1.2)
Total Protein: 7.1 g/dL (ref 6.4–8.3)

## 2018-04-01 NOTE — Telephone Encounter (Signed)
Gave avs and calendar ° °

## 2018-04-01 NOTE — Telephone Encounter (Signed)
Received call from pt stating that she is sitting in the parking lot at her work b/c she can't take her phone in & she is having a problem with her R breast.  She reports itching that started 4-5 days ago & skin is thick underneath, looks weird & feels heavy.  She states that it is beginning to feel warm & her nipple pokes out like it is swollen.  Discussed with Sigmund Hazel RN, Navigator & message sent for pt to be added to MD schedule for 10 am today & pt notified. Desk RN notified.

## 2018-04-01 NOTE — Assessment & Plan Note (Signed)
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  There is no obvious palpable lumps or nodules.  The hardening of the skin is suggestive of lymphedema.  I will refer the patient to physical therapy for lymphedema management.  I would like to see her back in 6 months for follow-up and assessment.

## 2018-04-01 NOTE — Progress Notes (Signed)
Patient Care Team: Wardell Honour, MD as PCP - General (Family Medicine) Felecia Shelling, Nanine Means, MD as Consulting Physician (Neurology) Jovita Kussmaul, MD as Consulting Physician (General Surgery) Nicholas Lose, MD as Consulting Physician (Hematology and Oncology) Kyung Rudd, MD as Consulting Physician (Radiation Oncology)  DIAGNOSIS:  Encounter Diagnoses  Name Primary?  . Lymphedema Yes  . Malignant neoplasm of lower-outer quadrant of right breast of female, estrogen receptor positive ([place])     SUMMARY OF ONCOLOGIC HISTORY:   Malignant neoplasm of lower-outer quadrant of right breast of female, estrogen receptor positive ([place])   03/26/2017 Initial Diagnosis    Palpable right breast mass with a normal mammogram: 8:00 position by ultrasound 3.2 cm irregular mass: Grade 2 invasive lobular cancer ER 85%, PR 100%, Ki-67 2%, HER-2 negative ratio 1.5, T2 N0 stage II a clinical stage      04/20/2017 Genetic Testing    Genetic counseling and testing for hereditary cancer syndromes performed on . Results are negative for pathogenic mutations in 46 genes analyzed by [facility]'s Common Hereditary Cancers Panel. Results are dated . Genes tested: APC, ATM, AXIN2, BARD1, BMPR1A, BRCA1, BRCA2, BRIP1, CDH1, CDKN2A, CHEK2, CTNNA1, DICER1, EPCAM, GREM1, HOXB13, KIT, MEN1, MLH1, MSH2, MSH3, MSH6, MUTYH, NBN, NF1, NTHL1, PALB2, PDGFRA, PMS2, POLD1, POLE, PTEN, RAD50, RAD51C, RAD51D, SDHA, SDHB, SDHC, SDHD, SMAD4, SMARCA4, STK11, TP53, TSC1, TSC2, and VHL.  Variants of uncertain significance (not clinically actionable) were noted in APC and BRIP1.         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  She came in urgently because over the past week she has noticed right breast swelling as well as thickening underneath the right breast along with itching sensation around the nipple.  She got really worried that this could be breast cancer recurrence and came in urgently to see [place].  She had mamm[facility] at [facility] in April 2019.  This was normal.  REVIEW OF SYSTEMS:   Constitutional: Denies fevers, chills or abnormal weight loss Eyes: Denies blurriness of vision Ears, nose, mouth, throat, and face: Denies mucositis or sore throat Respiratory: Denies cough, dyspnea or wheezes Cardiovascular: Denies palpitation, chest discomfort Gastrointestinal:  Denies nausea, heartburn or change in bowel habits Skin: Denies abnormal skin rashes Lymphatics: Denies new lymphadenopathy or easy bruising Neurological:Denies numbness, tingling or new weaknesses Behavioral/Psych: Mood is stable, no new changes  Extremities: No lower extremity edema Breast: Right breast swelling with itching and hardening underneath the breast All other systems were reviewed with the patient and are negative.  I have reviewed the past medical history, past surgical history, social history and family history with the patient and they are unchanged from previous note.  ALLERGIES:  is allergic to pork-derived products.  MEDICATIONS:  Current Outpatient  Medications  Medication Sig Dispense Refill  . acetaminophen (TYLENOL) 325 MG tablet Take 325-650 mg by mouth every 8 (eight) hours as needed (for pain or cramping).     Marland Kitchen amLODipine (NORVASC) 5 MG tablet Take 1 tablet (5 mg total) by mouth daily. 30  tablet 11  . ARIPiprazole (ABILIFY) 10 MG tablet Take 1 tablet (10 mg total) by mouth daily. For mood control (Patient taking differently: Take 10 mg by mouth daily. ) 30 tablet 0  . aspirin 81 MG chewable tablet Chew 1 tablet (81 mg total) by mouth daily.    Marland Kitchen atorvastatin (LIPITOR) 80 MG tablet TAKE 1 TABLET(80 MG) BY MOUTH DAILY (Patient taking differently: TAKE 1 TABLET(80 MG) BY MOUTH in the evening) 90 tablet 3  . baclofen (LIORESAL) 10 MG tablet Take up to 4/day for muscle spasms (Patient taking differently: Take 10-20 mg by mouth See admin instructions. 10 mg in the morning then 10 mg in the afternoon then 20 mg at bedtime) 120 each 11  . clonazePAM (KLONOPIN) 1 MG tablet Take 0.5 mg by mouth every evening.  5  . folic acid (FOLVITE) 1 MG tablet Take 1 mg by mouth daily.    . furosemide (LASIX) 20 MG tablet TAKE 1 TABLET(20 MG) BY MOUTH DAILY AS NEEDED (Patient taking differently: Take 20 mg by mouth once a day as needed for obvious swelling) 10 tablet 0  . gabapentin (NEURONTIN) 300 MG capsule Take 1 capsule (300 mg total) by mouth 2 (two) times daily. 60 capsule 0  . hydrOXYzine (ATARAX/VISTARIL) 25 MG tablet Take 1 tablet (25 mg total) by mouth every 6 (six) hours as needed for anxiety. (Patient not taking: Reported on 03/18/2018) 30 tablet 0  . lisinopril (PRINIVIL,ZESTRIL) 10 MG tablet TAKE 2 TABLETS( 20 MG) BY MOUTH EVERY MORNING AND 1 TABLET( 10 MG) EVERY EVENING (Patient taking differently: TAKE 2 TABLETS( 20 MG) BY MOUTH  TWICE A DAY) 90 tablet 0  . metoprolol tartrate (LOPRESSOR) 50 MG tablet Take 25 mg by mouth 2 (two) times daily.  3  . nitroGLYCERIN (NITROSTAT) 0.4 MG SL tablet Place 0.4 mg under the tongue every 5 (five) minutes as needed for chest pain.    . pantoprazole (PROTONIX) 40 MG tablet Take 1 tablet (40 mg total) by mouth daily. 30 tablet 0  . sertraline (ZOLOFT) 50 MG tablet Take 1 tablet (50 mg total) by mouth daily. For mood control (Patient taking differently: Take 50  mg by mouth daily. ) 30 tablet 0  . tamoxifen (NOLVADEX) 20 MG tablet TAKE 1 TABLET(20 MG) BY MOUTH DAILY 90 tablet 2  . traZODone (DESYREL) 50 MG tablet Take 1 tablet (50 mg total) by mouth at bedtime as needed for sleep. (Patient not taking: Reported on 03/18/2018) 30 tablet 0   No current facility-administered medications for this visit.    Facility-Administered Medications Ordered in Other Visits  Medication Dose Route Frequency Provider Last Rate Last Dose  . gadopentetate dimeglumine (MAGNEVIST) injection 12 mL  12 mL Intravenous Once PRN Sater, Richard A, MD      . gadopentetate dimeglumine (MAGNEVIST) injection 16 mL  16 mL Intravenous Once PRN Sater, Nanine Means, MD        PHYSICAL EXAMINATION: ECOG PERFORMANCE STATUS: 1 - Symptomatic but completely ambulatory  Vitals:   04/01/18 1027  BP: (!) 158/97  Pulse: 75  Resp: 20  Temp: 98.6 F (37 C)  SpO2: 100%   Filed Weights   04/01/18 1027  Weight: 160  lb 12.8 oz (72.9 kg)    GENERAL:alert, no distress and comfortable SKIN: skin color, texture, turgor are normal, no rashes or significant lesions EYES: normal, Conjunctiva are pink and non-injected, sclera clear OROPHARYNX:no exudate, no erythema and lips, buccal mucosa, and tongue normal  NECK: supple, thyroid normal size, non-tender, without nodularity LYMPH:  no palpable lymphadenopathy in the cervical, axillary or inguinal LUNGS: clear to auscultation and percussion with normal breathing effort HEART: regular rate & rhythm and no murmurs and no lower extremity edema ABDOMEN:abdomen soft, non-tender and normal bowel sounds MUSCULOSKELETAL:no cyanosis of digits and no clubbing  NEURO: alert & oriented x 3 with fluent speech, no focal motor/sensory deficits EXTREMITIES: No lower extremity edema BREAST:(exam performed in the presence of a chaperone)  LABORATORY DATA:  I have reviewed the data as listed CMP Latest Ref Rng & Units 03/18/2018 09/26/2017 08/27/2017  Glucose  65 - 99 mg/dL 116(H) 88 124(H)  BUN 6 - 20 mg/dL _0 Creatinine 0.44 - 1.00 mg/dL 1.24(H) 1.09(H) 1.15(H)  Sodium 135 - 145 mmol/L 142 132(L) 138  Potassium 3.5 - 5.1 mmol/L 4.0 3.5 3.6  Chloride 101 - 111 mmol/L 110 103 110  CO2 22 - 32 mmol/L 26 20(L) 23  Calcium 8.9 - 10.3 mg/dL 9.0 9.1 9.1  Total Protein 6.5 - 8.1 g/dL 6.2(L) 6.3(L) 6.2(L)  Total Bilirubin 0.3 - 1.2 mg/dL 0.5 0.5 0.2(L)  Alkaline Phos 38 - 126 U/L 61 49 52  AST 15 - 41 U/L _1 ALT 14 - 54 U/L _2 Lab Results  Component Value Date   WBC 10.4 (H) 04/01/2018   HGB 11.1 (L) 04/01/2018   HCT 34.0 (L) 04/01/2018   MCV 79.9 04/01/2018   PLT 319 04/01/2018   NEUTROABS 7.1 (H) 04/01/2018    ASSESSMENT & PLAN:  Malignant neoplasm of lower-outer quadrant of right breast of female, estrogen receptor positive ([place]) 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  There is no obvious palpable lumps or nodules.  The hardening of the skin is suggestive of lymphedema.  I will refer the patient to physical therapy for lymphedema management.  I would like to see her back in 6 months for follow-up and assessment.      Orders Placed This Encounter  Procedures  . Ambulatory referral to Physical Therapy    Referral Priority:   Routine    Referral Type:   Physical Medicine    Referral Reason:   Specialty Services Required    Requested Specialty:   Physical Therapy    Number of Visits  Requested:   1   The patient has a good understanding of the overall plan. she agrees with it. she will call with any problems that may develop before the next visit here.   Harriette Ohara, MD 04/01/18

## 2018-04-04 ENCOUNTER — Other Ambulatory Visit: Payer: Self-pay

## 2018-04-04 ENCOUNTER — Ambulatory Visit: Payer: No Typology Code available for payment source | Attending: Hematology and Oncology | Admitting: Rehabilitation

## 2018-04-04 ENCOUNTER — Encounter: Payer: Self-pay | Admitting: Adult Health

## 2018-04-04 DIAGNOSIS — M25511 Pain in right shoulder: Secondary | ICD-10-CM | POA: Diagnosis present

## 2018-04-04 DIAGNOSIS — Z483 Aftercare following surgery for neoplasm: Secondary | ICD-10-CM | POA: Insufficient documentation

## 2018-04-04 DIAGNOSIS — Z170 Estrogen receptor positive status [ER+]: Secondary | ICD-10-CM | POA: Diagnosis present

## 2018-04-04 DIAGNOSIS — C50511 Malignant neoplasm of lower-outer quadrant of right female breast: Secondary | ICD-10-CM | POA: Insufficient documentation

## 2018-04-04 DIAGNOSIS — I890 Lymphedema, not elsewhere classified: Secondary | ICD-10-CM | POA: Insufficient documentation

## 2018-04-04 DIAGNOSIS — G8929 Other chronic pain: Secondary | ICD-10-CM | POA: Diagnosis present

## 2018-04-04 NOTE — Patient Instructions (Signed)
Pt will wear small dot peach foam in a sports bra / large dot if tolerated 21min up to 3-4 hours.   No more underwire

## 2018-04-04 NOTE — Therapy (Signed)
[place], [place], [address] Phone: [phone]   Fax:  [phone]  Physical Therapy Evaluation  Patient Details  Name: Anna Henson MRN: [id number] Date of Birth: 1971/06/15 Referring Provider: Lurline Del, MD   Encounter Date: 04/04/2018  PT End of Session - 04/04/18 1625    Visit Number  1    Number of Visits  5    Date for PT Re-Evaluation  05/16/18    Authorization Type  Generic    Authorization Time Period  allowed up to $60/visit for up to 5 visits    PT Start Time  1519    PT Stop Time  1615    PT Time Calculation (min)  56 min    Activity Tolerance  Patient tolerated treatment well    Behavior During Therapy  [facility] for tasks assessed/performed       Past Medical History:  Diagnosis Date  . Anxiety   . Bipolar disorder ([place])   . Breast cancer, right breast ([place]) ~ 03/2017  . CAD in native artery    a. 09/2014 Cath/PCI in setting of [place] - s/p 2.25 x 12 mm Promus Premier DES to the RCA;  b. 11/2016 Myoview: EF 56%, no ischemia;  c. 12/2016 NSTEMI/PCI: LM nl, LAd 25p, LCX 30p, RCA 100p (2.75x38 Promus Premier DES), mRCA 10 ISR, EF 50-55%.  . Chronic kidney disease    she sthinks has stage 2 CKD, has had US doppler and has some stenosis , this was done in [place]   . CKD (chronic kidney disease), stage II   . Depression   . Dysrhythmia    Tachycardia in evenings  . Genetic testing 04/23/2017   Ms. Caster underwent genetic counseling and testing for hereditary cancer syndromes on 04/05/2017. Her results were negative for mutations in all 46 genes analyzed by [facility]'s 46-gene Common Hereditary Cancers Panel. Genes analyzed include: APC, ATM, AXIN2, BARD1, BMPR1A, BRCA1, BRCA2, BRIP1, CDH1, CDKN2A, CHEK2, CTNNA1, DICER1, EPCAM, GREM1, HOXB13, KIT, MEN1, MLH1, MSH2, MSH3, MSH6, MUTYH, NBN,  . GERD (gastroesophageal reflux disease)   . Headache    "bi-weekly" (05/07/2017)  . Heart attack  ([place]) 12/31/2016  . HOCM (hypertrophic obstructive cardiomyopathy) ([place])    a. 12/2016 Echo: EF 65-70%,  mod conc LVH, dynamic obstruction @ rest, peak velocity of 291 cm/sec w/ peak gradient of 68mHg, no rwma, Gr1 DD, triv TR, PASP 135mg.  . Marland Kitchenyperlipidemia   . Hypertension    > 5 years  . Migraine    "probably 1/month" (05/07/2017)  . Palpitations   . Pneumonia    "twice when I was a child" (05/07/2017)  . Tobacco abuse 09/13/2014  . 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 ABDOMINAL HERNIA REPAIR  ~ 2001; ~ 2005  . BREAST BIOPSY Right ~ 03/2017 X 2  . BREAST LUMPECTOMY WITH RADIOACTIVE SEED AND SENTINEL LYMPH NODE BIOPSY Right 06/21/2017   Procedure: RIGHT BREAST BRACKETED SEED GUIDED LUMPECTOMY AND SENTINEL LYMPH NODE BIOPSY;  Surgeon: ToJovita KussmaulMD;  [facility] Service: General;  Laterality: Right;  . CORONARY ANGIOPLASTY WITH STENT PLACEMENT     L main OK, LAD OK, CFX 30%, RCA 90%-0% w/ 2.25 x 12 mm Promus Premier DES, EF 55%  . CORONARY/GRAFT ACUTE MI REVASCULARIZATION N/A 12/31/2016   Procedure: Coronary/Graft Acute MI Revascularization;  Surgeon: MiSherren MochaMD;  [facility];  Service: Cardiovascular;  Laterality: N/A;  . HERNIA REPAIR     times 2  umbilical  . LEFT HEART CATH AND CORONARY ANGIOGRAPHY N/A 12/31/2016   Procedure: Left Heart Cath and Coronary Angiography;  Surgeon: Sherren Mocha, MD;  Location: [facility] CV LAB;  Service: Cardiovascular;  Laterality: N/A;  . LEFT HEART CATHETERIZATION WITH CORONARY ANGIOGRAM N/A 09/14/2014   Procedure: LEFT HEART CATHETERIZATION WITH CORONARY ANGIOGRAM;  Surgeon: Burnell Blanks, MD;  Location: [facility] C[facility] LAB;  Service: Cardiovascular;  Laterality: N/A;  . PERCUTANEOUS CORONARY STENT INTERVENTION (PCI-S)  09/14/2014   Procedure: PERCUTANEOUS CORONARY STENT INTERVENTION (PCI-S);  Surgeon: Burnell Blanks, MD;  Location: [facility] CATH LAB;  Service:  Cardiovascular;;  . TONSILLECTOMY  2005    There were no vitals filed for this visit.   Subjective Assessment - 04/04/18 1523    Subjective  Pt presents with complaints of Rt breast swelling and itching since last Thursday.  MD visit suggested lymphedema.      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      Patient Stated Goals  decrease pain; starting to get pain when I sleep and move around      Currently in Pain?  No/denies    Pain Score  -- 5 at the worst     Pain Location  Breast    Pain Orientation  Left    Pain Descriptors / Indicators  Aching;Throbbing    Pain Radiating Towards  axilla     Pain Onset  1 to 4 weeks ago    Pain Frequency  Intermittent    Aggravating Factors   sleeping and moving     Pain Relieving Factors  certain positions, taking the breast off         [facility] PT Assessment - 04/04/18 0001      Assessment   Medical Diagnosis  Rt breast cancer    Referring Provider  Lurline Del, MD    Onset Date/Surgical Date  03/28/18 swelling onset    Hand Dominance  Right    Prior Therapy  no      Precautions   Precaution Comments  cancer      Balance Screen   Has the patient fallen in the past 6 months  No    Has the patient had a decrease in activity level because of a fear of falling?   No    Is the patient reluctant to leave their home because of a fear of falling?   No      Home Environment   Living Environment  Private residence    Available Help at Discharge  Family      Prior Function   Level of Independence  Independent    Vocation  Full time employment    Vocation Requirements  Desk      Cognition   Overall Cognitive Status  Within Functional Limits for tasks assessed       Observation/Other Assessments   Observations  Rt breast withdrawn after radiation/skin tightness, normal radiation darkening    Skin Integrity  well healed incisions      Sensation   Additional Comments  reports some numbness since surgery upper arm      Coordination   Gross Motor Movements  are Fluid and Coordinated  No      ROM / Strength   AROM / PROM / Strength  AROM      AROM   AROM Assessment Site  Shoulder    Right/Left Shoulder  Right;Left    Right Shoulder Flexion  143 Degrees    Right Shoulder ABduction  113 Degrees    Right Shoulder Internal Rotation  -- behind the back to the glute with pt having breast pain    Right Shoulder External Rotation  85 Degrees    Left Shoulder Flexion  160 Degrees    Left Shoulder ABduction  160 Degrees    Left Shoulder External Rotation  87 Degrees      Palpation   Palpation comment  induration along underwire line with indentation Rt breast complete length  all other tissue normal        LYMPHEDEMA/ONCOLOGY QUESTIONNAIRE - 04/04/18 1536      Type   Cancer Type  Right breast cancer      Surgeries   Lumpectomy Date  06/21/17    Sentinel Lymph Node Biopsy Date  06/21/17    Number Lymph Nodes Removed  5      Date Lymphedema/Swelling Started   Date  03/28/18      Treatment   Active Chemotherapy Treatment  No    Past Chemotherapy Treatment  No    Active Radiation Treatment  No    Past Radiation Treatment  Yes    Current Hormone Treatment  Yes    Drug Name  Tamoxifen      What other symptoms do you have   Are you Having Heaviness or Tightness  Yes    Are you having Pain  Yes    Are you having pitting edema  No      Lymphedema Assessments   Lymphedema Assessments  Upper extremities      Right Upper Extremity Lymphedema   15 cm Proximal to Olecranon Process  31.8 cm    10 cm Proximal to Olecranon Process  30 cm    Olecranon Process  26 cm    15 cm Proximal to Ulnar Styloid Process  27.8 cm    10 cm Proximal to Ulnar  Styloid Process  23 cm    Just Proximal to Ulnar Styloid Process  16.5 cm    Across Hand at [employer]  19.6 cm    At [place] of 2nd Digit  6 cm      Left Upper Extremity Lymphedema   10 cm Proximal to Olecranon Process  30.2 cm    Olecranon Process  25.5 cm    10 cm Proximal to Ulnar Styloid Process  22.5 cm    Just Proximal to Ulnar Styloid Process  16 cm    Across Hand at [employer]  20 cm    At [place] of 2nd Digit  6 cm             Objective measurements completed on examination: See above findings.      [place] Adult PT Treatment/Exercise - 04/04/18 0001      Manual Therapy   Manual Therapy  Edema management    Manual therapy comments  attempted coban2 chest wrap but with the size of the breast not allowing enough inferior compression; given one small peach foam and one large peach foam insert for indurated areas and pt to buy a cheap sports bra before getting compression  PT Education - 04/04/18 1625    Education Details  POC, anatomy, lymphedema education    Person(s) Educated  Patient    Methods  Explanation    Comprehension  Verbalized understanding          PT Long Term Goals - 04/04/18 1631      PT LONG TERM GOAL #1   Title  Pt will obtain appropriate compression bra with foam inserts as needed    Time  3    Period  Weeks    Status  New    Target Date  05/02/18      PT LONG TERM GOAL #2   Title  Pt will report return to baseline breast pain     Time  3    Period  Weeks    Status  New    Target Date  05/02/18      PT LONG TERM GOAL #3   Title  Pt will be independent with self MLD for the Rt breast    Time  3    Period  Weeks    Status  New    Target Date  05/02/18      PT LONG TERM GOAL #4   Title  Pt willl improve the Rt shoulder AROM to no Rt breast pain with active IR or flexion     Time  3    Period  Weeks    Status  New    Target Date  05/02/18      PT LONG TERM GOAL #5   Title  Pt will report no night pain  in the Rt breast    Time  3    Period  Weeks    Status  New    Target Date  05/02/18             Plan - 04/04/18 1626    Clinical Impression Statement  Pt presents with new onset of Rt breast swelling, hardness, and itching after Rt lumpectomy in 2018.  Pt also completed radiation in January of 2018 with no chemotherapy.  Most of the breast induration follows the underwire line of the bra and is slightly tender to touch.  The breast is smaller than the Lt due to rectraction.  Pt also demonstrates Rt shoulder decreased ROM and pain that she reports has been there since surgery.  Her circumferential measurements are slightly larger in the upper arm but bilaterally.      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, 5 cm, LCIS, 0/5 lymph nodes negative, ER 85%, PR 100%, Ki-67 2%, HER-2 negative ratio, history of MI in march of last year, transverse myelitis in 2017 still having some leg problems and locking up    Clinical Presentation  Evolving    Clinical Presentation due to:  new onset of symptoms    Clinical Decision Making  Moderate    Rehab Potential  Excellent    PT Frequency  2x / week    PT Duration  3 weeks    PT Treatment/Interventions  Scar mobilization;Passive range of motion;Taping;Manual lymph drainage;Manual techniques;Patient/family education;ADLs/Self Care Home Management    PT Next Visit Plan  how did pads work?, compression bra script back? begin Rt breast MLD, start to teach patient, include Rt shoulder ROM and HEP for both as able    Consulted and Agree with Plan of Care  Patient  Patient will benefit from skilled therapeutic intervention in order to improve the following deficits and impairments:  Decreased knowledge of use of DME, Pain, Increased edema, Decreased range of motion  Visit Diagnosis: Carcinoma of lower-outer quadrant of right breast in female, estrogen  receptor positive ([place])  Lymphedema, not elsewhere classified  Aftercare following surgery for neoplasm  Chronic right shoulder pain     Problem List Patient Active Problem List   Diagnosis Date Noted  . Chronic anemia   . MDD (major depressive disorder), recurrent episode, severe ([place]) 08/28/2017  . Neck stiffness 08/22/2017  . Abnormal finding on MRI of brain 08/22/2017  . HOCM (hypertrophic obstructive cardiomyopathy) ([place])   . Chest pain 05/06/2017  . Genetic testing 04/23/2017  . Malignant neoplasm of lower-outer quadrant of right breast of female, estrogen receptor positive ([place]) 03/29/2017  . Dysesthesia 03/12/2017  . Palpitations 02/23/2017  . S/P cardiac cath (12/2016) a. acute total occlusion of the RCA tx w/PCI DES, b. mild nonobs LAD/LCx stenosis, c. mild segmental LV sys dx w/ sev hypokinesis, LVEF 50-55% 01/02/2017  . Non-ST elevation myocardial infarction (NSTEMI), subsequent episode of care ([place]) 12/31/2016  . Malignant hypertension   . Chronic kidney disease   . ST elevation myocardial infarction involving right coronary artery ([place])   . Abnormal EKG   . Nausea   . Ischemic cardiomyopathy   . Muscle spasms of both lower extremities 04/17/2016  . Gait disturbance 01/25/2016  . Transverse myelitis ([place]) 12/14/2015  . Acute-on-chronic kidney injury ([place]) 11/12/2015  . Neck pain 11/12/2015  . Hypokalemia 11/12/2015  . Abnormal MRI, neck 11/12/2015  . Numbness 11/12/2015  . Hypertension   . Hyperlipidemia   . Coronary artery disease involving native coronary artery of native heart without angina pectoris   . Facial numbness   . Right arm numbness   . Throat pain 11/09/2015  . Drooling 11/09/2015  . CAD (coronary artery disease), native coronary artery 09/15/2014  . Headache 09/15/2014  . UTI (urinary tract infection): Probable 09/14/2014  . Candida infection of genital region 09/14/2014  . Unstable angina ([place]) 09/13/2014  . HLD (hyperlipidemia)  09/13/2014  . Otitis 09/13/2014  . ACS (acute coronary syndrome) ([place]) 09/13/2014  . Tobacco abuse 09/13/2014  . Essential hypertension 09/09/2014    Shan Levans, PT 04/04/2018, 4:33 PM  [place], [place], [address] Phone: [phone]   Fax:  [phone]  Name: Anna Henson MRN: [id number] Date of Birth: 03/25/1971

## 2018-04-12 ENCOUNTER — Encounter

## 2018-04-16 ENCOUNTER — Ambulatory Visit: Payer: No Typology Code available for payment source | Attending: Hematology and Oncology

## 2018-04-16 DIAGNOSIS — M25511 Pain in right shoulder: Secondary | ICD-10-CM | POA: Diagnosis present

## 2018-04-16 DIAGNOSIS — C50511 Malignant neoplasm of lower-outer quadrant of right female breast: Secondary | ICD-10-CM | POA: Diagnosis not present

## 2018-04-16 DIAGNOSIS — I890 Lymphedema, not elsewhere classified: Secondary | ICD-10-CM | POA: Diagnosis present

## 2018-04-16 DIAGNOSIS — G8929 Other chronic pain: Secondary | ICD-10-CM | POA: Insufficient documentation

## 2018-04-16 DIAGNOSIS — Z483 Aftercare following surgery for neoplasm: Secondary | ICD-10-CM | POA: Diagnosis present

## 2018-04-16 DIAGNOSIS — Z170 Estrogen receptor positive status [ER+]: Secondary | ICD-10-CM | POA: Insufficient documentation

## 2018-04-16 NOTE — Patient Instructions (Signed)

## 2018-04-16 NOTE — Therapy (Signed)
[place], [place], [address] Phone: [phone]   Fax:  [phone]  Physical Therapy Treatment  Patient Details  Name: Anna Henson MRN: [id number] Date of Birth: 1970-11-30 Referring Provider: Nicholas Lose, MD   Encounter Date: 04/16/2018  PT End of Session - 04/16/18 1710    Visit Number  2    Number of Visits  5    Date for PT Re-Evaluation  05/16/18    PT Start Time  8832    PT Stop Time  1651    PT Time Calculation (min)  42 min    Activity Tolerance  Patient tolerated treatment well    Behavior During Therapy  [facility] for tasks assessed/performed       Past Medical History:  Diagnosis Date  . Anxiety   . Bipolar disorder ([place])   . Breast cancer, right breast ([place]) ~ 03/2017  . CAD in native artery    a. 09/2014 Cath/PCI in setting of [place] - s/p 2.25 x 12 mm Promus Premier DES to the RCA;  b. 11/2016 Myoview: EF 56%, no ischemia;  c. 12/2016 NSTEMI/PCI: LM nl, LAd 25p, LCX 30p, RCA 100p (2.75x38 Promus Premier DES), mRCA 10 ISR, EF 50-55%.  . Chronic kidney disease    she sthinks has stage 2 CKD, has had US doppler and has some stenosis , this was done in [place]   . CKD (chronic kidney disease), stage II   . Depression   . Dysrhythmia    Tachycardia in evenings  . Genetic testing 04/23/2017   Ms. Thede underwent genetic counseling and testing for hereditary cancer syndromes on 04/05/2017. Her results were negative for mutations in all 46 genes analyzed by [employer]'s 46-gene Common Hereditary Cancers Panel. Genes analyzed include: APC, ATM, AXIN2, BARD1, BMPR1A, BRCA1, BRCA2, BRIP1, CDH1, CDKN2A, CHEK2, CTNNA1, DICER1, EPCAM, GREM1, HOXB13, KIT, MEN1, MLH1, MSH2, MSH3, MSH6, MUTYH, NBN,  . GERD (gastroesophageal reflux disease)   . Headache    "bi-weekly" (05/07/2017)  . Heart attack ([place]) 12/31/2016  . HOCM (hypertrophic obstructive cardiomyopathy) ([place])    a. 12/2016 Echo: EF 65-70%,  mod conc  LVH, dynamic obstruction @ rest, peak velocity of 291 cm/sec w/ peak gradient of 12mHg, no rwma, Gr1 DD, triv TR, PASP 146mg.  . Marland Kitchenyperlipidemia   . Hypertension    > 5 years  . Migraine    "probably 1/month" (05/07/2017)  . Palpitations   . Pneumonia    "twice when I was a child" (05/07/2017)  . Tobacco abuse 09/13/2014  . [place]    Past Surgical History:  Procedure Laterality Date  . ABDOMINAL HERNIA REPAIR  ~ 2001; ~ 2005  . BREAST BIOPSY Right ~ 03/2017 X 2  . BREAST LUMPECTOMY WITH RADIOACTIVE SEED AND SENTINEL LYMPH NODE BIOPSY Right 06/21/2017   Procedure: RIGHT BREAST BRACKETED SEED GUIDED LUMPECTOMY AND SENTINEL LYMPH NODE BIOPSY;  Surgeon: ToJovita KussmaulMD;  [facility] Service: General;  Laterality: Right;  . CORONARY ANGIOPLASTY WITH STENT PLACEMENT     L main OK, LAD OK, CFX 30%, RCA 90%-0% w/ 2.25 x 12 mm Promus Premier DES, EF 55%  . CORONARY/GRAFT ACUTE MI REVASCULARIZATION N/A 12/31/2016   Procedure: Coronary/Graft Acute MI Revascularization;  Surgeon: MiSherren MochaMD;  [facility];  Service: Cardiovascular;  Laterality: N/A;  . HERNIA REPAIR     times 2  umbilical  . LEFT HEART CATH AND CORONARY ANGIOGRAPHY N/A  12/31/2016   Procedure: Left Heart Cath and Coronary Angiography;  Surgeon: Sherren Mocha, MD;  Location: [place] CV LAB;  Service: Cardiovascular;  Laterality: N/A;  . LEFT HEART CATHETERIZATION WITH CORONARY ANGIOGRAM N/A 09/14/2014   Procedure: LEFT HEART CATHETERIZATION WITH CORONARY ANGIOGRAM;  Surgeon: Burnell Blanks, MD;  Location[facility] CATH LAB;  Service: Cardiovascular;  Laterality: N/A;  . PERCUTANEOUS CORONARY STENT INTERVENTION (PCI-S)  09/14/2014   Procedure: PERCUTANEOUS CORONARY STENT INTERVENTION (PCI-S);  Surgeon: Burnell Blanks, MD;  Location: [facility] CATH LAB;  Service: Cardiovascular;;  . TONSILLECTOMY  2005    There were no vitals filed for this visit.  Subjective Assessment -  04/16/18 1612    Subjective  I've been wearing the peach small dotted foam I was given last time and I could tell my pain was less when I was wearing it so it did help.     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      Patient Stated Goals  decrease pain; starting to get pain when I sleep and move around      Currently in Pain?  No/denies                       [facility] Adult PT Treatment/Exercise - 04/16/18 0001      Shoulder Exercises: Supine   External Rotation  AAROM;Right;5 reps with dowel for 5 sec holds    External Rotation Limitations  Also with fingers clasped behind head and baducting elbows, 5x, 5 sec    Flexion  AAROM;Right;5 reps with dowel for 5 sec holds    ABduction  AAROM;Right;5 reps with dowel for 5 sec holds      Manual Therapy   Manual Therapy  Manual Lymphatic Drainage (MLD)    Manual Lymphatic Drainage (MLD)  In Supine: Short neck, superficial and deep abdominals, Rt inguinal and Lt axillary nodes, Rt axillo-inguinal and anterior inter-axillary anastomosis, then focused on Rt breast, especially inferior aspect; then into Lt S/L for further work along Rt axillo-inguinal and also posterior inter-axillary anastomosis and was able to focus on lateral aspect of breast here especially at and around incision, then finished in supine retracing all steps beginning to instruct pt in technqiue and sequence throughout.              PT Education - 04/16/18 1621    Education Details  Supine dowel exercises    Person(s) Educated  Patient    Methods  Explanation;Demonstration;Handout    Comprehension  Verbalized understanding;Returned demonstration;Need further instruction          PT Long Term Goals -  04/04/18 1631      PT LONG TERM GOAL #1   Title  Pt will obtain appropriate compression bra with foam inserts as needed    Time  3    Period  Weeks    Status  New    Target Date  05/02/18      PT LONG TERM GOAL #2   Title  Pt will report return to baseline breast pain     Time  3    Period  Weeks    Status  New    Target Date  05/02/18  PT LONG TERM GOAL #3   Title  Pt will be independent with self MLD for the Rt breast    Time  3    Period  Weeks    Status  New    Target Date  05/02/18      PT LONG TERM GOAL #4   Title  Pt willl improve the Rt shoulder AROM to no Rt breast pain with active IR or flexion     Time  3    Period  Weeks    Status  New    Target Date  05/02/18      PT LONG TERM GOAL #5   Title  Pt will report no night pain in the Rt breast    Time  3    Period  Weeks    Status  New    Target Date  05/02/18            Plan - 04/16/18 1711    Clinical Impression Statement  First session of manual lymph drainage went very well today. Instructed pt, while performing, in correct technique, pressure and sequence of MLD. Also answering her questions throughout. By end of session therapist and pt could palpably tell great improvement in swelling being reduced and fibrosis softer. Pt did not get a sports bra but did get bras without underwire and has been wearing those and small dotted foam issued at last visit and reports noticing less pain when wearing these. She was issued script for compression bra today and info to call [employer]. Also instructed and issued supine dowel exercises for her Rt shoulder which she tolerated well.    Rehab Potential  Excellent    PT Frequency  2x / week    PT Duration  3 weeks    PT Treatment/Interventions  Scar mobilization;Passive range of motion;Taping;Manual lymph drainage;Manual techniques;Patient/family education;ADLs/Self Care Home Management    PT Next Visit Plan  Cont and instruct pt in Rt breast MLD and review  Rt shoulder ROM and progress HEP prn    Recommended Other Services  Issued script for compression bra and info to call [employer] [facility] [employer] and Agree with Plan of Care  Patient       Patient will benefit from skilled therapeutic intervention in order to improve the following deficits and impairments:  Decreased knowledge of use of DME, Pain, Increased edema, Decreased range of motion  Visit Diagnosis: 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 Chronic anemia   . MDD (major depressive disorder), recurrent episode, severe ([place]) 08/28/2017  . Neck stiffness 08/22/2017  . Abnormal finding on MRI of brain 08/22/2017  . HOCM (hypertrophic obstructive cardiomyopathy) ([place])   . Chest pain 05/06/2017  . Genetic testing 04/23/2017  . Malignant neoplasm of lower-outer quadrant of right breast of female, estrogen receptor positive ([place]) 03/29/2017  . Dysesthesia 03/12/2017  . Palpitations 02/23/2017  . S/P cardiac cath (12/2016) a. acute total occlusion of the RCA tx w/PCI DES, b. mild nonobs LAD/LCx stenosis, c. mild segmental LV sys dx w/ sev hypokinesis, LVEF 50-55% 01/02/2017  . Non-ST elevation myocardial infarction (NSTEMI), subsequent episode of care ([place]) 12/31/2016  . Malignant hypertension   . Chronic kidney disease   . ST elevation myocardial  infarction involving right coronary artery ([place])   . Abnormal EKG   . Nausea   . Ischemic cardiomyopathy   . Muscle spasms of both lower extremities 04/17/2016  . Gait disturbance 01/25/2016  . Transverse myelitis ([place]) 12/14/2015  . Acute-on-chronic kidney injury ([place]) 11/12/2015  . Neck pain 11/12/2015  . Hypokalemia 11/12/2015  . Abnormal MRI, neck 11/12/2015  . Numbness 11/12/2015  .  Hypertension   . Hyperlipidemia   . Coronary artery disease involving native coronary artery of native heart without angina pectoris   . Facial numbness   . Right arm numbness   . Throat pain 11/09/2015  . Drooling 11/09/2015  . CAD (coronary artery disease), native coronary artery 09/15/2014  . Headache 09/15/2014  . UTI (urinary tract infection): Probable 09/14/2014  . Candida infection of genital region 09/14/2014  . Unstable angina ([place]) 09/13/2014  . HLD (hyperlipidemia) 09/13/2014  . Otitis 09/13/2014  . ACS (acute coronary syndrome) ([place]) 09/13/2014  . Tobacco abuse 09/13/2014  . Essential hypertension 09/09/2014    Otelia Limes, PTA 04/16/2018, 5:18 PM  [place], [place], [address] Phone: [phone]   Fax:  [phone]  Name: Anna Henson MRN: [id number] Date of Birth: 28-Dec-1970

## 2018-04-30 ENCOUNTER — Ambulatory Visit: Payer: No Typology Code available for payment source

## 2018-04-30 DIAGNOSIS — C50511 Malignant neoplasm of lower-outer quadrant of right female breast: Secondary | ICD-10-CM

## 2018-04-30 DIAGNOSIS — G8929 Other chronic pain: Secondary | ICD-10-CM

## 2018-04-30 DIAGNOSIS — Z483 Aftercare following surgery for neoplasm: Secondary | ICD-10-CM

## 2018-04-30 DIAGNOSIS — I890 Lymphedema, not elsewhere classified: Secondary | ICD-10-CM

## 2018-04-30 DIAGNOSIS — M25511 Pain in right shoulder: Secondary | ICD-10-CM

## 2018-04-30 DIAGNOSIS — Z170 Estrogen receptor positive status [ER+]: Principal | ICD-10-CM

## 2018-04-30 NOTE — Therapy (Addendum)
[place], [place], [address] Phone: [phone]   Fax:  [phone]  Physical Therapy Treatment  Patient Details  Name: Anna Henson MRN: [id number] Date of Birth: 1971/10/05 Referring Provider: Nicholas Lose, MD   Encounter Date: 04/30/2018  PT End of Session - 04/30/18 1709    Visit Number  3    Number of Visits  5    Date for PT Re-Evaluation  05/16/18    PT Start Time  1611    PT Stop Time  1702    PT Time Calculation (min)  51 min    Activity Tolerance  Patient tolerated treatment well    Behavior During Therapy  [facility] for tasks assessed/performed       Past Medical History:  Diagnosis Date  . Anxiety   . Bipolar disorder ([place])   . Breast cancer, right breast ([place]) ~ 03/2017  . CAD in native artery    a. 09/2014 Cath/PCI in setting of [place] - s/p 2.25 x 12 mm Promus Premier DES to the RCA;  b. 11/2016 Myoview: EF 56%, no ischemia;  c. 12/2016 NSTEMI/PCI: LM nl, LAd 25p, LCX 30p, RCA 100p (2.75x38 Promus Premier DES), mRCA 10 ISR, EF 50-55%.  . Chronic kidney disease    she sthinks has stage 2 CKD, has had US doppler and has some stenosis , this was done in [place]   . CKD (chronic kidney disease), stage II   . Depression   . Dysrhythmia    Tachycardia in evenings  . Genetic testing 04/23/2017   Ms. Blaze underwent genetic counseling and testing for hereditary cancer syndromes on 04/05/2017. Her results were negative for mutations in all 46 genes analyzed by [employer]'s 46-gene Common Hereditary Cancers Panel. Genes analyzed include: APC, ATM, AXIN2, BARD1, BMPR1A, BRCA1, BRCA2, BRIP1, CDH1, CDKN2A, CHEK2, CTNNA1, DICER1, EPCAM, GREM1, HOXB13, KIT, MEN1, MLH1, MSH2, MSH3, MSH6, MUTYH, NBN,  . GERD (gastroesophageal reflux disease)   . Headache    "bi-weekly" (05/07/2017)  . Heart attack ([place]) 12/31/2016  . HOCM (hypertrophic obstructive cardiomyopathy) ([place])    a. 12/2016 Echo: EF 65-70%,  mod  conc LVH, dynamic obstruction @ rest, peak velocity of 291 cm/sec w/ peak gradient of 79mHg, no rwma, Gr1 DD, triv TR, PASP 126mg.  . Marland Kitchenyperlipidemia   . Hypertension    > 5 years  . Migraine    "probably 1/month" (05/07/2017)  . Palpitations   . Pneumonia    "twice when I was a child" (05/07/2017)  . Tobacco abuse 09/13/2014  . Transverse myelitis ([facility]    Past Surgical History:  Procedure Laterality Date  . ABDOMINAL HERNIA REPAIR  ~ 2001; ~ 2005  . BREAST BIOPSY Right ~ 03/2017 X 2  . BREAST LUMPECTOMY WITH RADIOACTIVE SEED AND SENTINEL LYMPH NODE BIOPSY Right 06/21/2017   Procedure: RIGHT BREAST BRACKETED SEED GUIDED LUMPECTOMY AND SENTINEL LYMPH NODE BIOPSY;  Surgeon: ToJovita KussmaulMD;  [facility] Service: General;  Laterality: Right;  . CORONARY ANGIOPLASTY WITH STENT PLACEMENT     L main OK, LAD OK, CFX 30%, RCA 90%-0% w/ 2.25 x 12 mm Promus Premier DES, EF 55%  . CORONARY/GRAFT ACUTE MI REVASCULARIZATION N/A 12/31/2016   Procedure: Coronary/Graft Acute MI Revascularization;  Surgeon: MiSherren MochaMD;  [facility];  Service: Cardiovascular;  Laterality: N/A;  . HERNIA REPAIR     times 2  umbilical  . LEFT HEART CATH AND CORONARY ANGIOGRAPHY N/A  12/31/2016   Procedure: Left Heart Cath and Coronary Angiography;  Surgeon: Sherren Mocha, MD;  Location: [place] CV LAB;  Service: Cardiovascular;  Laterality: N/A;  . LEFT HEART CATHETERIZATION WITH CORONARY ANGIOGRAM N/A 09/14/2014   Procedure: LEFT HEART CATHETERIZATION WITH CORONARY ANGIOGRAM;  Surgeon: Burnell Blanks, MD;  Location: [facility] CATH LAB;  Service: Cardiovascular;  Laterality: N/A;  . PERCUTANEOUS CORONARY STENT INTERVENTION (PCI-S)  09/14/2014   Procedure: PERCUTANEOUS CORONARY STENT INTERVENTION (PCI-S);  Surgeon: Burnell Blanks, MD;  Location: [facility] CATH LAB;  Service: Cardiovascular;;  . TONSILLECTOMY  2005    There were no vitals filed for this visit.  Subjective  Assessment - 04/30/18 1610    Subjective  I've been wearing the peach dotted foam and it really helps. I dropped it though and need another one. I've also done the self MLD about 3 times/wk and stretching to the point of pain and it's been getting better.     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      Patient Stated Goals  decrease pain; starting to get pain when I sleep and move around      Currently in Pain?  No/denies         [facility] PT Assessment - 04/30/18 0001      AROM   Right Shoulder Flexion  151 Degrees    Right Shoulder ABduction  124 Degrees                   [facility] Adult PT Treatment/Exercise - 04/30/18 0001      Manual Therapy   Manual therapy comments  issued another piece of peach Medi foam with large dots in thin stockinette    Manual Lymphatic Drainage (MLD)  In Supine: Short neck, superficial and deep abdominals, Rt inguinal and Lt axillary nodes, Rt axillo-inguinal and anterior inter-axillary anastomosis, then focused on Rt breast, especially inferior aspect; then into Lt S/L for further work along Rt axillo-inguinal and also posterior inter-axillary anastomosis and was able to focus on lateral aspect of breast here especially at and around incision, then finished in supine retracing all steps reviewing with pt throughout and having her return demonstrations.                  PT Long Term Goals - 04/04/18 1631      PT LONG TERM GOAL #1   Title  Pt will obtain appropriate compression bra with foam inserts as needed    Time  3    Period  Weeks    Status  New    Target Date  05/02/18      PT LONG TERM GOAL #2   Title  Pt will report return to baseline breast pain     Time  3    Period   Weeks    Status  New    Target Date  05/02/18      PT LONG TERM GOAL #3   Title  Pt will be independent with self MLD for the Rt breast    Time  3    Period  Weeks    Status  New    Target Date  05/02/18      PT LONG TERM  GOAL #4   Title  Pt willl improve the Rt shoulder AROM to no Rt breast pain with active IR or flexion     Time  3    Period  Weeks    Status  New    Target Date  05/02/18      PT LONG TERM GOAL #5   Title  Pt will report no night pain in the Rt breast    Time  3    Period  Weeks    Status  New    Target Date  05/02/18            Plan - 04/30/18 1710    Clinical Impression Statement  Reviewed manual lymph drainage (MLD) with pt today and after mod VCs and hand over hand pressure she was able to return correct demonstration. Her A/ROM has also improved since evaluation. Pt is doing well with compliance thus far of self MLD and AA/ROM stretching at home. Encouraged her for next 2 weeks to try MLD daily to see if this would help decrease her initial flare up of breast lymphedema and decrease the fibrosis.  Pt hasn't made an appt yet to [facility] stating her [employer] won't cover a compression bra so she is saving up for now and only wearing bras without an underwire.     Rehab Potential  Excellent    PT Frequency  2x / week    PT Duration  3 weeks    PT Treatment/Interventions  Scar mobilization;Passive range of motion;Taping;Manual lymph drainage;Manual techniques;Patient/family education;ADLs/Self Care Home Management    PT Next Visit Plan  Cont and review (prn) Rt breast MLD and progress Rt shoulder HEP adding supine scapular series; cont ROM stretching to Rt shoulder    Consulted and Agree with Plan of Care  Patient       Patient will benefit from skilled therapeutic intervention in order to improve the following deficits and impairments:  Decreased knowledge of use of DME, Pain, Increased edema, Decreased range of motion  Visit  Diagnosis: 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 Chronic anemia   . MDD (major depressive disorder), recurrent episode, severe ([place]) 08/28/2017  . Neck stiffness 08/22/2017  . Abnormal finding on MRI of brain 08/22/2017  . HOCM (hypertrophic obstructive cardiomyopathy) ([place])   . Chest pain 05/06/2017  . Genetic testing 04/23/2017  . Malignant neoplasm of lower-outer quadrant of right breast of female, estrogen receptor positive ([place]) 03/29/2017  . Dysesthesia 03/12/2017  . Palpitations 02/23/2017  . S/P cardiac cath (12/2016) a. acute total occlusion of the RCA tx w/PCI DES, b. mild nonobs LAD/LCx stenosis, c. mild segmental LV sys dx w/ sev hypokinesis, LVEF 50-55% 01/02/2017  . Non-ST elevation myocardial infarction (NSTEMI), subsequent episode of care ([place]) 12/31/2016  . Malignant hypertension   . Chronic kidney disease   . ST elevation myocardial infarction involving right coronary artery ([place])   . Abnormal EKG   . Nausea   . Ischemic cardiomyopathy   . Muscle spasms of both lower extremities 04/17/2016  . Gait disturbance 01/25/2016  . Transverse myelitis ([place]) 12/14/2015  . Acute-on-chronic kidney injury ([place]) 11/12/2015  . Neck pain 11/12/2015  . Hypokalemia 11/12/2015  . Abnormal MRI, neck 11/12/2015  . Numbness  11/12/2015  . Hypertension   . Hyperlipidemia   . Coronary artery disease involving native coronary artery of native heart without angina pectoris   . Facial numbness   . Right arm numbness   . Throat pain 11/09/2015  . Drooling 11/09/2015  . CAD (coronary artery disease), native coronary artery 09/15/2014  . Headache 09/15/2014  . UTI (urinary tract infection): Probable 09/14/2014  . Candida infection of genital region  09/14/2014  . Unstable angina ([place]) 09/13/2014  . HLD (hyperlipidemia) 09/13/2014  . Otitis 09/13/2014  . ACS (acute coronary syndrome) ([place]) 09/13/2014  . Tobacco abuse 09/13/2014  . Essential hypertension 09/09/2014    Otelia Limes, PTA 04/30/2018, 5:15 PM  [place], [place], [address] Phone: [phone]   Fax:  [phone]  Name: Anna Henson MRN: [id number] Date of Birth: 07/24/1971   PHYSICAL THERAPY DISCHARGE SUMMARY  Visits from Start of Care:3  Current functional level related to goals / functional outcomes: See above   Remaining deficits: See above   Education / Equipment: HEP for ROM and MLD Plan: Patient agrees to discharge.  Patient goals were partially met. Patient is being discharged due to being pleased with the current functional level.  ?????    Shan Levans, PT 10/17/18

## 2018-06-14 ENCOUNTER — Other Ambulatory Visit: Payer: Self-pay | Admitting: Cardiology

## 2018-06-14 ENCOUNTER — Other Ambulatory Visit: Payer: Self-pay | Admitting: *Deleted

## 2018-06-14 MED ORDER — METOPROLOL TARTRATE 50 MG PO TABS: 25.0000 mg | ORAL_TABLET | Freq: Two times a day (BID) | ORAL | 1 refills | Status: DC

## 2018-07-09 ENCOUNTER — Encounter: Payer: Self-pay | Admitting: Physician Assistant

## 2018-07-09 ENCOUNTER — Telehealth: Payer: Self-pay | Admitting: Internal Medicine

## 2018-07-09 ENCOUNTER — Ambulatory Visit (INDEPENDENT_AMBULATORY_CARE_PROVIDER_SITE_OTHER): Payer: PRIVATE HEALTH INSURANCE | Admitting: Physician Assistant

## 2018-07-09 VITALS — BP 186/104 | HR 61 | Ht 62.0 in | Wt 158.6 lb

## 2018-07-09 DIAGNOSIS — R079 Chest pain, unspecified: Secondary | ICD-10-CM

## 2018-07-09 DIAGNOSIS — I10 Essential (primary) hypertension: Secondary | ICD-10-CM

## 2018-07-09 DIAGNOSIS — R252 Cramp and spasm: Secondary | ICD-10-CM | POA: Diagnosis not present

## 2018-07-09 DIAGNOSIS — E785 Hyperlipidemia, unspecified: Secondary | ICD-10-CM | POA: Diagnosis not present

## 2018-07-09 MED ORDER — HYDRALAZINE HCL 25 MG PO TABS: 25.0000 mg | ORAL_TABLET | Freq: Three times a day (TID) | ORAL | 1 refills | Status: DC

## 2018-07-09 NOTE — Patient Instructions (Signed)
Medication Instructions:  Your physician has recommended you make the following change in your medication: START Hydralazine 25mg  three times daily. An Rx has been sent to your pharmacy   Labwork: Lipid, Cmet, Mag today  Testing/Procedures: I will discuss with Dr.Hilty a plan of care/ further testing  Follow-Up: Your physician recommends that you schedule a follow-up appointment in: 3 weeks with [name], PA   Any Other Special Instructions Will Be Listed Below (If Applicable). Your physician has requested that you regularly monitor and record your blood pressure readings at home. Please use the same machine at the same time of day to check your readings and record them to bring to your follow-up visit.      If you need a refill on your cardiac medications before your next appointment, please call your pharmacy.

## 2018-07-09 NOTE — Progress Notes (Signed)
Cardiology Office Note   Date:  07/09/2018   ID:  Anna Henson, DOB 1970/10/26, MRN [id number]  PCP:  Patient, No Pcp Per  Cardiologist:  Pixie Casino, MD 12/21/2017 Anna Ferries, PA-C   Chief Complaint  Patient presents with  . Chest Pain    pt states earlier in the morning, took a nitroglycerin and the pain went away     History of Present Illness: Anna Henson is a 47 y.o. female with a history of HLD, HTN, CKD III, tob use, DES mRCA 2015, DES pRCA 12/31/2016 w/ mild LAD/CFX dz, EF 50-55%  12/21/2017 office visit, sharp constant chest discomfort, discontinue Effient, continue aspirin, 46-monthfollow-up, lipids check in September 10/1 phone notes regarding elevated BP 178/103, appointment made  Anna Plumepresents for cardiology follow up.  Prior to the acute events, she had not been exercising regularly. Her job is sedentary. She is not very active at baseline. She has some MS issues with leg cramping, but thinks she can walk or use an elliptical.   For the cramping, she started taking potassium OTC, but it has not helped.   Her BP has been high for a couple of months, but had not been checked recently before then.  She does not take the Lasix, has not been having problems w/ LE edema, no orthopnea or PND.   Sometimes after or during intercourse, she has chest pain. It is relieved by rest. Within the last week, she has taken nitro twice for the pain. The CP started after she became aware her BP was up.  Prior to that, she was not having any exertional chest pain.  No palpitations, no presyncope or syncope.   Past Medical History:  Diagnosis Date  . Anxiety   . [place]   . [place] cancer, right breast ([place] ~ 03/2017  . CAD in native artery    a. 09/2014 Cath/PCI in setting of [facility]- s/p 2.25 x 12 mm Promus Premier DES to the RCA;  b. 11/2016 Myoview: EF 56%, no ischemia;  c. 12/2016 NSTEMI/PCI: LM nl, LAd 25p, LCX 30p, RCA 100p (2.75x38 Promus  Premier DES), mRCA 10 ISR, EF 50-55%.  . Chronic kidney disease    she sthinks has stage 2 CKD, has had [place] and has some stenosis , [place]  . CKD (chronic kidney disease), stage II   . Depression   . Dysrhythmia    Tachycardia in evenings  . Genetic testing 04/23/2017   Anna Henson underwent genetic counseling and testing for hereditary cancer syndromes on 04/05/2017. Her results were negative for mutations in all 46 genes analyzed by [employer]'s 46-gene Common Hereditary Cancers Panel. Genes analyzed include: APC, ATM, AXIN2, BARD1, BMPR1A, BRCA1, BRCA2, BRIP1, CDH1, CDKN2A, CHEK2, CTNNA1, DICER1, EPCAM, GREM1, HOXB13, KIT, MEN1, MLH1, MSH2, MSH3, MSH6, MUTYH, NBN,  . GERD (gastroesophageal reflux disease)   . Headache    "bi-weekly" (05/07/2017)  . Heart [place] ([place] 12/[place]/2016  . HOCM (hypertrophic obstructive cardiomyopathy) ([place]    a. 12/2016 Echo: EF 65-70%,  mod conc LVH, dynamic obstruction @ rest, peak velocity of 291 cm/sec w/ peak gradient of 343mg, no rwma, Gr1 DD, triv TR, PASP 1550m.  . HMarland Kitchenperlipidemia   . Hypertension    > 5 years  . Migraine    "probably 1/month" (05/07/2017)  . Palpitations   . Pneumonia    "twice when I was a child" (05/07/2017)  . Tobacco abuse 09/13/2014  .  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 ABDOMINAL HERNIA REPAIR  ~ 2001; ~ 2005  . BREAST BIOPSY Right ~ 03/2017 X 2  . BREAST LUMPECTOMY WITH RADIOACTIVE SEED AND SENTINEL LYMPH NODE BIOPSY Right 06/21/2017   Procedure: RIGHT BREAST BRACKETED SEED GUIDED LUMPECTOMY AND SENTINEL LYMPH NODE BIOPSY;  Surgeon: Jovita Kussmaul, MD;  Location: [place] [facility];  Service: General;  Laterality: Right;  . CORONARY ANGIOPLASTY WITH STENT PLACEMENT     L main OK, LAD OK, CFX 30%, RCA 90%-0% w/ 2.25 x 12 mm Promus Premier DES, EF 55%  . CORONARY/GRAFT ACUTE MI REVASCULARIZATION N/A 12/31/2016   Procedure: Coronary/Graft Acute MI Revascularization;   Surgeon: Sherren Mocha, MD;  Location: [facility];  Service: Cardiovascular;  Laterality: N/A;  . HERNIA REPAIR     times 2  umbilical  . LEFT HEART CATH AND CORONARY ANGIOGRAPHY N/A 12/31/2016   Procedure: Left Heart Cath and Coronary Angiography;  Surgeon: Sherren Mocha, MD;  Location: [facility];  Service: Cardiovascular;  Laterality: N/A;  . LEFT HEART CATHETERIZATION WITH CORONARY ANGIOGRAM N/A 09/14/2014   Procedure: LEFT HEART CATHETERIZATION WITH CORONARY ANGIOGRAM;  Surgeon: Burnell Blanks, MD;  Location: [facility] CATH LAB;  Service: Cardiovascular;  Laterality: N/A;  . PERCUTANEOUS CORONARY STENT INTERVENTION (PCI-S)  09/14/2014   Procedure: PERCUTANEOUS CORONARY STENT INTERVENTION (PCI-S);  Surgeon: Burnell Blanks, MD;  Location: [facility] CATH LAB;  Service: Cardiovascular;;  . TONSILLECTOMY  2005    Current Outpatient Medications  Medication Sig Dispense Refill  . acetaminophen (TYLENOL) 325 MG tablet Take 325-650 mg by mouth every 8 (eight) hours as needed (for pain or cramping).     Marland Kitchen amLODipine (NORVASC) 5 MG tablet TAKE 1 TABLET(5 MG) BY MOUTH DAILY 30 tablet 4  . ARIPiprazole (ABILIFY) 10 MG tablet Take 1 tablet (10 mg total) by mouth daily. For mood control (Patient taking differently: Take 10 mg by mouth daily. ) 30 tablet 0  . aspirin 81 MG chewable tablet Chew 1 tablet (81 mg total) by mouth daily.    Marland Kitchen atorvastatin (LIPITOR) 80 MG tablet TAKE 1 TABLET(80 MG) BY MOUTH DAILY (Patient taking differently: TAKE 1 TABLET(80 MG) BY MOUTH in the evening) 90 tablet 3  . baclofen (LIORESAL) 10 MG tablet Take up to 4/day for muscle spasms (Patient taking differently: Take 10-20 mg by mouth See admin instructions. 10 mg in the morning then 10 mg in the afternoon then 20 mg at bedtime) 120 each 11  . clonazePAM (KLONOPIN) 1 MG tablet Take 0.5 mg by mouth every evening.  5  . folic acid (FOLVITE) 1 MG tablet Take 1 mg by mouth daily.    . furosemide (LASIX) 20 MG  tablet TAKE 1 TABLET(20 MG) BY MOUTH DAILY AS NEEDED (Patient taking differently: Take 20 mg by mouth once a day as needed for obvious swelling) 10 tablet 0  . gabapentin (NEURONTIN) 300 MG capsule Take 1 capsule (300 mg total) by mouth 2 (two) times daily. 60 capsule 0  . hydrOXYzine (ATARAX/VISTARIL) 25 MG tablet Take 1 tablet (25 mg total) by mouth every 6 (six) hours as needed for anxiety. 30 tablet 0  . lisinopril (PRINIVIL,ZESTRIL) 10 MG tablet TAKE 2 TABLETS( 20 MG) BY MOUTH EVERY MORNING AND 1 TABLET( 10 MG) EVERY EVENING (Patient taking differently: TAKE 2 TABLETS( 20 MG) BY MOUTH  TWICE A DAY) 90 tablet 0  . metoprolol tartrate (LOPRESSOR) 50 MG tablet  Take 0.5 tablets (25 mg total) by mouth 2 (two) times daily. 90 tablet 1  . nitroGLYCERIN (NITROSTAT) 0.4 MG SL tablet Place 0.4 mg under the tongue every 5 (five) minutes as needed for chest pain.    . pantoprazole (PROTONIX) 40 MG tablet Take 1 tablet (40 mg total) by mouth daily. 30 tablet 0  . sertraline (ZOLOFT) 50 MG tablet Take 1 tablet (50 mg total) by mouth daily. For mood control (Patient taking differently: Take 50 mg by mouth daily. ) 30 tablet 0  . tamoxifen (NOLVADEX) 20 MG tablet TAKE 1 TABLET(20 MG) BY MOUTH DAILY 90 tablet 2  . traZODone (DESYREL) 50 MG tablet Take 1 tablet (50 mg total) by mouth at bedtime as needed for sleep. 30 tablet 0   No current facility-administered medications for this visit.    Facility-Administered Medications Ordered in Other Visits  Medication Dose Route Frequency Provider Last Rate Last Dose  . gadopentetate dimeglumine (MAGNEVIST) injection 12 mL  12 mL Intravenous Once PRN Sater, Richard A, MD      . gadopentetate dimeglumine (MAGNEVIST) injection 16 mL  16 mL Intravenous Once PRN Sater, Nanine Means, MD        Allergies:   Pork-derived products    Social History:  The patient  reports that she quit smoking about 2 years ago. Her smoking use included cigarettes. She has a 15.00 pack-year  smoking history. She has never used smokeless tobacco. She reports that she drinks about 2.0 standard drinks of alcohol per week. She reports that she does not use drugs.   Family History:  The patient's family history includes Bone cancer (age of onset: 57) in her father; Brain cancer (age of onset: 42) in her father; Diabetes in her mother; Drug abuse in her father and sister; Heart disease in her mother; Hyperlipidemia in her mother; Hypertension in her mother; Lung cancer in her father; Mental illness in her brother and maternal grandmother; Stomach cancer (age of onset: 22) in her paternal grandmother.    ROS:  Please see the history of present illness. All other systems are reviewed and negative.    PHYSICAL EXAM: VS:  BP (!) 186/104   Pulse 61   Ht '5\' 2"'  (1.575 m)   Wt 158 lb 9.6 oz (71.9 kg)   SpO2 98%   BMI 29.01 kg/m  , BMI Body mass index is 29.01 kg/m. GEN: Well nourished, well developed, female in no acute distress  HEENT: normal for age  Neck: no JVD, no carotid bruit, no masses Cardiac: RRR; soft murmur, no rubs, or gallops Respiratory:  clear to auscultation bilaterally, normal work of breathing GI: soft, nontender, nondistended, + BS MS: no deformity or atrophy; no edema; distal pulses are 2+ in all 4 extremities   Skin: warm and dry, no rash Neuro:  Strength and sensation are intact Psych: euthymic mood, full affect   EKG:  EKG is not ordered today.  CATH: 12/31/2016 1. Acute total occlusion of the RCA, treated successful with PCI using a 2.75x38 mm Promus DES, deployed in overlapping fashion with the old stent which was widely patent 2. Mild nonobstructive LAD/LCx stenosis 3. Mild segmental LV systolic dysfunction with severe hypokinesis of the basal and mid inferior walls, but vigorous contractility of the anterior and apical walls, LVEF estimated 50-55%  Recommend:  DAPT with ASA and brilinta at least 12 months without interruption  Recent  Labs: 09/26/2017: B Natriuretic Peptide 37.2 04/01/2018: ALT 11; BUN 12; Creatinine 1.19; Hemoglobin  11.1; Platelet Count 319; Potassium 4.1; Sodium 141    Lipid Panel    Component Value Date/Time   CHOL 120 06/20/2017 0857   TRIG 70 06/20/2017 0857   HDL 37 (L) 06/20/2017 0857   CHOLHDL 3.2 06/20/2017 0857   CHOLHDL 3.9 12/31/2016 1036   VLDL 20 12/31/2016 1036   LDLCALC 69 06/20/2017 0857     Wt Readings from Last 3 Encounters:  07/09/18 158 lb 9.6 oz (71.9 kg)  04/01/18 160 lb 12.8 oz (72.9 kg)  03/18/18 140 lb (63.5 kg)     Other studies Reviewed: Additional studies/ records that were reviewed today include: Office notes, hospital records and testing.  ASSESSMENT AND PLAN:  1. Chest pain, moderate risk of cardiac etiology: She has been having exertional chest pain, but it is in the setting of uncontrolled hypertension. - The chest pain has been relieved quickly by either rest or sublingual nitroglycerin. - Her stent was a year and a half ago, and she had no other significant coronary artery disease at that time. - Continue aspirin, high-dose Lipitor, beta-blocker and ACE inhibitor.  She has sublingual nitroglycerin. - She is reluctant to have a chemical stress test and cannot walk well enough to do a treadmill because of leg cramping.   -Discuss with Dr. Debara Pickett if he wishes to go ahead and do a cardiac catheterization, or hold off and to manage her medically for now.  2.  Hypertension: Her blood pressure has been uncontrolled. - She has been on hydralazine in the past, and believes she tolerated it.  She says that she is able to be compliant with a 3 times daily medication.  Start hydralazine 25 mg 3 times daily. -Assess response and then may need to add Imdur or increase the amlodipine up to 10 mg a day.  3.  Hyperlipidemia: A year ago, her cholesterol was at target, recheck now.  4.  Leg cramps: She has had problems with these for a long time.  Her magnesium has been on  the low side of normal in the past, the last time it was 4.0.  She has not had a magnesium level done.  Check both now. - She has excellent pulses in her legs, do not think this is claudication.  However, Dr. Debara Pickett to review and advise if we should go ahead and do ABIs.   Current medicines are reviewed at length with the patient today.  The patient does not have concerns regarding medicines.  The following changes have been made: Add hydralazine  Labs/ tests ordered today include:   Orders Placed This Encounter  Procedures  . Lipid panel  . Comprehensive Metabolic Panel (CMET)  . Magnesium     Disposition:   FU with Pixie Casino, MD   Signed, Anna Ferries, PA-C  07/09/2018 4:26 PM    [place] Phone: [phone]; Fax: [phone]  This note was written with the assistance of speech recognition software. Please excuse any transcriptional errors.

## 2018-07-09 NOTE — Telephone Encounter (Signed)
Pt called to report that she has been having very elevated BP for the past several weeks. She has been to the OP clinic for OBGYN needs and her Bp has been very high there. They advised her to see Cardio soon. Her BP at home has been 170-180/ 90-110... She experiences headache and dizziness when it is high and has been having a hard time at work with her BP so elevated and not feeling well. She denioes chest pain but did take a nitro this morning hoping it would help but she said it is still high and now she has an headache. We went over her med list and she is still taking all of her meds but she has not had her Lasix because she has not had any swelling. I advised pt that she should be seen and she agreed and will come in today to see [name] PA. Pt will not drive herself if her BP is elevated at the time of her appt.

## 2018-07-09 NOTE — Telephone Encounter (Signed)
New message:       Pt c/o BP issue: STAT if pt c/o blurred vision, one-sided weakness or slurred speech  1. What are your last 5 BP readings? 178/103  2. Are you having any other symptoms (ex. Dizziness, headache, blurred vision, passed out)? Dizziness/tingling  3. What is your BP issue? Pt states her BP has been going up for the last couple of weeks and she has been to her OB Dr and they are very concerned about her BP and the pt states it drops over night.

## 2018-07-10 LAB — COMPREHENSIVE METABOLIC PANEL
ALT: 16 IU/L (ref 0–32)
AST: 19 IU/L (ref 0–40)
Albumin/Globulin Ratio: 1.7 (ref 1.2–2.2)
Albumin: 4.8 g/dL (ref 3.5–5.5)
Alkaline Phosphatase: 71 IU/L (ref 39–117)
BUN/Creatinine Ratio: 13 (ref 9–23)
BUN: 14 mg/dL (ref 6–24)
Bilirubin Total: 0.2 mg/dL (ref 0.0–1.2)
CO2: 20 mmol/L (ref 20–29)
Calcium: 9.9 mg/dL (ref 8.7–10.2)
Chloride: 104 mmol/L (ref 96–106)
Creatinine, Ser: 1.08 mg/dL — ABNORMAL HIGH (ref 0.57–1.00)
GFR calc Af Amer: 71 mL/min/{1.73_m2} (ref >59)
GFR calc non Af Amer: 62 mL/min/{1.73_m2} (ref >59)
Globulin, Total: 2.9 g/dL (ref 1.5–4.5)
Glucose: 67 mg/dL (ref 65–99)
Potassium: 4.2 mmol/L (ref 3.5–5.2)
Sodium: 140 mmol/L (ref 134–144)
Total Protein: 7.7 g/dL (ref 6.0–8.5)

## 2018-07-10 LAB — LIPID PANEL
Chol/HDL Ratio: 3.5 ratio (ref 0.0–4.4)
Cholesterol, Total: 149 mg/dL (ref 100–199)
HDL: 42 mg/dL (ref >39)
LDL Calculated: 87 mg/dL (ref 0–99)
Triglycerides: 99 mg/dL (ref 0–149)
VLDL Cholesterol Cal: 20 mg/dL (ref 5–40)

## 2018-07-10 LAB — MAGNESIUM: Magnesium: 2.2 mg/dL (ref 1.6–2.3)

## 2018-07-27 ENCOUNTER — Other Ambulatory Visit: Payer: Self-pay

## 2018-07-27 ENCOUNTER — Emergency Department (HOSPITAL_COMMUNITY): Payer: No Typology Code available for payment source

## 2018-07-27 ENCOUNTER — Encounter (HOSPITAL_COMMUNITY): Payer: Self-pay | Admitting: Emergency Medicine

## 2018-07-27 ENCOUNTER — Telehealth: Payer: Self-pay | Admitting: Cardiology

## 2018-07-27 ENCOUNTER — Inpatient Hospital Stay (HOSPITAL_COMMUNITY)
Admission: EM | Admit: 2018-07-27 | Discharge: 2018-07-29 | DRG: 305 | Disposition: A | Discharge status: Home / Self Care | Payer: No Typology Code available for payment source | Attending: Cardiology | Admitting: Cardiology

## 2018-07-27 DIAGNOSIS — I421 Obstructive hypertrophic cardiomyopathy: Secondary | ICD-10-CM | POA: Diagnosis present

## 2018-07-27 DIAGNOSIS — I160 Hypertensive urgency: Secondary | ICD-10-CM | POA: Diagnosis not present

## 2018-07-27 DIAGNOSIS — Z813 Family history of other psychoactive substance abuse and dependence: Secondary | ICD-10-CM

## 2018-07-27 DIAGNOSIS — E785 Hyperlipidemia, unspecified: Secondary | ICD-10-CM | POA: Diagnosis present

## 2018-07-27 DIAGNOSIS — Z170 Estrogen receptor positive status [ER+]: Secondary | ICD-10-CM

## 2018-07-27 DIAGNOSIS — Z923 Personal history of irradiation: Secondary | ICD-10-CM

## 2018-07-27 DIAGNOSIS — I2510 Atherosclerotic heart disease of native coronary artery without angina pectoris: Secondary | ICD-10-CM | POA: Diagnosis present

## 2018-07-27 DIAGNOSIS — Z79810 Long term (current) use of selective estrogen receptor modulators (SERMs): Secondary | ICD-10-CM

## 2018-07-27 DIAGNOSIS — Z87891 Personal history of nicotine dependence: Secondary | ICD-10-CM

## 2018-07-27 DIAGNOSIS — Z7982 Long term (current) use of aspirin: Secondary | ICD-10-CM

## 2018-07-27 DIAGNOSIS — N182 Chronic kidney disease, stage 2 (mild): Secondary | ICD-10-CM | POA: Diagnosis present

## 2018-07-27 DIAGNOSIS — R0789 Other chest pain: Secondary | ICD-10-CM | POA: Diagnosis present

## 2018-07-27 DIAGNOSIS — Z8349 Family history of other endocrine, nutritional and metabolic diseases: Secondary | ICD-10-CM

## 2018-07-27 DIAGNOSIS — I252 Old myocardial infarction: Secondary | ICD-10-CM

## 2018-07-27 DIAGNOSIS — Z9089 Acquired absence of other organs: Secondary | ICD-10-CM

## 2018-07-27 DIAGNOSIS — Z808 Family history of malignant neoplasm of other organs or systems: Secondary | ICD-10-CM

## 2018-07-27 DIAGNOSIS — I129 Hypertensive chronic kidney disease with stage 1 through stage 4 chronic kidney disease, or unspecified chronic kidney disease: Secondary | ICD-10-CM | POA: Diagnosis present

## 2018-07-27 DIAGNOSIS — Z800 Family history of malignant neoplasm of digestive organs: Secondary | ICD-10-CM

## 2018-07-27 DIAGNOSIS — F419 Anxiety disorder, unspecified: Secondary | ICD-10-CM | POA: Diagnosis present

## 2018-07-27 DIAGNOSIS — Z8249 Family history of ischemic heart disease and other diseases of the circulatory system: Secondary | ICD-10-CM

## 2018-07-27 DIAGNOSIS — Z818 Family history of other mental and behavioral disorders: Secondary | ICD-10-CM

## 2018-07-27 DIAGNOSIS — Z833 Family history of diabetes mellitus: Secondary | ICD-10-CM

## 2018-07-27 DIAGNOSIS — K219 Gastro-esophageal reflux disease without esophagitis: Secondary | ICD-10-CM | POA: Diagnosis present

## 2018-07-27 DIAGNOSIS — Z955 Presence of coronary angioplasty implant and graft: Secondary | ICD-10-CM

## 2018-07-27 DIAGNOSIS — G8929 Other chronic pain: Secondary | ICD-10-CM | POA: Diagnosis present

## 2018-07-27 DIAGNOSIS — R51 Headache: Secondary | ICD-10-CM

## 2018-07-27 DIAGNOSIS — I10 Essential (primary) hypertension: Secondary | ICD-10-CM

## 2018-07-27 DIAGNOSIS — F319 Bipolar disorder, unspecified: Secondary | ICD-10-CM | POA: Diagnosis present

## 2018-07-27 DIAGNOSIS — Z801 Family history of malignant neoplasm of trachea, bronchus and lung: Secondary | ICD-10-CM

## 2018-07-27 DIAGNOSIS — R519 Headache, unspecified: Secondary | ICD-10-CM

## 2018-07-27 DIAGNOSIS — Z79899 Other long term (current) drug therapy: Secondary | ICD-10-CM

## 2018-07-27 DIAGNOSIS — C50511 Malignant neoplasm of lower-outer quadrant of right female breast: Secondary | ICD-10-CM | POA: Diagnosis present

## 2018-07-27 DIAGNOSIS — R079 Chest pain, unspecified: Secondary | ICD-10-CM | POA: Diagnosis present

## 2018-07-27 DIAGNOSIS — G43909 Migraine, unspecified, not intractable, without status migrainosus: Secondary | ICD-10-CM | POA: Diagnosis present

## 2018-07-27 LAB — CBC
HCT: 34.1 % — ABNORMAL LOW (ref 36.0–46.0)
Hemoglobin: 10.8 g/dL — ABNORMAL LOW (ref 12.0–15.0)
MCH: 26.8 pg (ref 26.0–34.0)
MCHC: 31.7 g/dL (ref 30.0–36.0)
MCV: 84.6 fL (ref 80.0–100.0)
Platelets: 427 10*3/uL — ABNORMAL HIGH (ref 150–400)
RBC: 4.03 MIL/uL (ref 3.87–5.11)
RDW: 17.1 % — ABNORMAL HIGH (ref 11.5–15.5)
WBC: 9.8 10*3/uL (ref 4.0–10.5)
nRBC: 0 % (ref 0.0–0.2)

## 2018-07-27 LAB — BASIC METABOLIC PANEL
Anion gap: 7 (ref 5–15)
BUN: 13 mg/dL (ref 6–20)
CO2: 21 mmol/L — ABNORMAL LOW (ref 22–32)
Calcium: 9.1 mg/dL (ref 8.9–10.3)
Chloride: 111 mmol/L (ref 98–111)
Creatinine, Ser: 1.28 mg/dL — ABNORMAL HIGH (ref 0.44–1.00)
GFR calc Af Amer: 57 mL/min — ABNORMAL LOW (ref >60)
GFR calc non Af Amer: 49 mL/min — ABNORMAL LOW (ref >60)
Glucose, Bld: 115 mg/dL — ABNORMAL HIGH (ref 70–99)
Potassium: 3.8 mmol/L (ref 3.5–5.1)
Sodium: 139 mmol/L (ref 135–145)

## 2018-07-27 LAB — I-STAT BETA HCG BLOOD, ED (MC, WL, AP ONLY): I-stat hCG, quantitative: <5 m[IU]/mL (ref <5)

## 2018-07-27 LAB — I-STAT TROPONIN, ED: Troponin i, poc: 0 ng/mL (ref 0.00–0.08)

## 2018-07-27 MED ORDER — NITROGLYCERIN IN D5W 200-5 MCG/ML-% IV SOLN
0.0000 ug/min | Freq: Once | INTRAVENOUS | Status: AC
  Administered 2018-07-27: 5 ug/min via INTRAVENOUS
  Filled 2018-07-27: qty 250

## 2018-07-27 NOTE — ED Notes (Signed)
Results reviewed.  No changes in acuity at this time 

## 2018-07-27 NOTE — ED Provider Notes (Signed)
[facility] DE[facility] Provider Note   CSN: [id number] Arrival date & time: 07/27/18  1950     History   Chief Complaint Chief Complaint  Patient presents with  . Chest Pain    HPI Anna Henson is a 47 y.o. female.  HPI  47 yo female ho mi,hypertension seen by cardiologist 2 weeks ago and hydralazine tid, with scheduled f/u in 3 weeks.  During that time she has beenworking with continued elevarted bp 170s up to 190.  Tries to rest but began having headache one week- diffusely across front 8-9/10.  Taking tylenol without relief. Nausea also without vomiting. Chest pain today this am, pain stabbing to left of chest -patient had stabbing pain with prior mi.  Took her regular aspirin.  Took tylenol today for headache.  Did not take her nitro as it makes headache worse. Last nitro two weeks ago.  CP at 6/10 constant today. Stent to RCA 12/15 and 3/18 [facility]- Dr. Debara Pickett Past Medical History:  Diagnosis Date  . Anxiety   . Bipolar disorder ([place])   . Breast cancer, right breast ([place]) ~ 03/2017  . CAD in native artery    a. 09/2014 Cath/PCI in setting of [place] - s/p 2.25 x 12 mm Promus Premier DES to the RCA;  b. 11/2016 Myoview: EF 56%, no ischemia;  c. 12/2016 NSTEMI/PCI: LM nl, LAd 25p, LCX 30p, RCA 100p (2.75x38 Promus Premier DES), mRCA 10 ISR, EF 50-55%.  . Chronic kidney disease    she sthinks has stage 2 CKD, has had US doppler and has some stenosis , this was done in [place]   . CKD (chronic kidney disease), stage II   . Depression   . Dysrhythmia    Tachycardia in evenings  . Genetic testing 04/23/2017   Ms. Currey underwent genetic counseling and testing for hereditary cancer syndromes on 04/05/2017. Her results were negative for mutations in all 46 genes analyzed by [facility]'s 46-gene Common Hereditary Cancers Panel. Genes analyzed include: APC, ATM, AXIN2, BARD1, BMPR1A, BRCA1, BRCA2, BRIP1, CDH1, CDKN2A, CHEK2, CTNNA1, DICER1,  EPCAM, GREM1, HOXB13, KIT, MEN1, MLH1, MSH2, MSH3, MSH6, MUTYH, NBN,  . GERD (gastroesophageal reflux disease)   . Headache    "bi-weekly" (05/07/2017)  . Heart attack ([place]) 12/31/2016  . HOCM (hypertrophic obstructive cardiomyopathy) ([place])    a. 12/2016 Echo: EF 65-70%,  mod conc LVH, dynamic obstruction @ rest, peak velocity of 291 cm/sec w/ peak gradient of 72mHg, no rwma, Gr1 DD, triv TR, PASP 16mg.  . Marland Kitchenyperlipidemia   . Hypertension    > 5 years  . Migraine    "probably 1/month" (05/07/2017)  . Palpitations   . Pneumonia    "twice when I was a child" (05/07/2017)  . Tobacco abuse 09/13/2014  . 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 Chronic anemia   . MDD (major depressive disorder), recurrent episode, severe ([facility] [place]11/20/2018  . Neck stiffness 08/22/2017  . Abnormal finding on MRI of brain 08/22/2017  . HOCM (hypertrophic obstructive cardiomyopathy) ([facility]  . Chest pain 05/06/2017  . Genetic testing 04/23/2017  . Malignant neoplasm of lower-outer quadrant of right breast of female, estrogen receptor positive ([facility] [place]06/21/2018  . Dysesthesia 03/12/2017  . Palpitations 02/23/2017  . S/P cardiac cath (12/2016) a. acute total occlusion of the RCA tx w/PCI DES, b. mild nonobs LAD/LCx stenosis, c. mild segmental LV sys dx w/ sev hypokinesis, LVEF  50-55% 01/02/2017  . Non-ST elevation myocardial infarction (NSTEMI), subsequent episode of care ([place]) 12/31/2016  . Malignant hypertension   . Chronic kidney disease   . ST elevation myocardial infarction involving right coronary artery ([place])   . Abnormal EKG   . Nausea   . Ischemic cardiomyopathy   . Muscle spasms of both lower extremities 04/17/2016  . Gait disturbance 01/25/2016  . 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 Acute-on-chronic kidney injury ([place]) 11/12/2015  . Neck pain 11/12/2015  . Hypokalemia 11/12/2015  . Abnormal MRI, neck 11/12/2015  . Numbness 11/12/2015  .  Hypertension   . Hyperlipidemia   . Coronary artery disease involving native coronary artery of native heart without angina pectoris   . Facial numbness   . Right arm numbness   . Throat pain 11/09/2015  . Drooling 11/09/2015  . CAD (coronary artery disease), native coronary artery 09/15/2014  . Headache 09/15/2014  . UTI (urinary tract infection): Probable 09/14/2014  . Candida infection of genital region 09/14/2014  . Unstable angina ([place]) 09/13/2014  . HLD (hyperlipidemia) 09/13/2014  . Otitis 09/13/2014  . ACS (acute coronary syndrome) ([place]) 09/13/2014  . Tobacco abuse 09/13/2014  . Essential hypertension 09/09/2014    Past Surgical History:  Procedure Laterality Date  . ABDOMINAL HERNIA REPAIR  ~ 2001; ~ 2005  . BREAST BIOPSY Right ~ 03/2017 X 2  . BREAST LUMPECTOMY WITH RADIOACTIVE SEED AND SENTINEL LYMPH NODE BIOPSY Right 06/21/2017   Procedure: RIGHT BREAST BRACKETED SEED GUIDED LUMPECTOMY AND SENTINEL LYMPH NODE BIOPSY;  Surgeon: Jovita Kussmaul, MD;  Location: [facility];  Service: General;  Laterality: Right;  . CORONARY ANGIOPLASTY WITH STENT PLACEMENT     L main OK, LAD OK, CFX 30%, RCA 90%-0% w/ 2.25 x 12 mm Promus Premier DES, EF 55%  . CORONARY/GRAFT ACUTE MI REVASCULARIZATION N/A 12/31/2016   Procedure: Coronary/Graft Acute MI Revascularization;  Surgeon: Sherren Mocha, MD;  Location: [facility];  Service: Cardiovascular;  Laterality: N/A;  . HERNIA REPAIR     times 2  umbilical  . LEFT HEART CATH AND CORONARY ANGIOGRAPHY N/A 12/31/2016   Procedure: Left Heart Cath and Coronary Angiography;  Surgeon: Sherren Mocha, MD;  Location: [facility];  Service: Cardiovascular;  Laterality: N/A;  . LEFT HEART CATHETERIZATION WITH CORONARY ANGIOGRAM N/A 09/14/2014   Procedure: LEFT HEART CATHETERIZATION WITH CORONARY ANGIOGRAM;  Surgeon: Burnell Blanks, MD;  [facility];  Service: Cardiovascular;  Laterality: N/A;  . PERCUTANEOUS CORONARY STENT  INTERVENTION (PCI-S)  09/14/2014   Procedure: PERCUTANEOUS CORONARY STENT INTERVENTION (PCI-S);  Surgeon: Burnell Blanks, MD;  Location: [facility] CATH LAB;  Service: Cardiovascular;;  . TONSILLECTOMY  2005     OB History   None      Home Medications    Prior to Admission medications   Medication Sig Start Date End Date Taking? Authorizing Provider  acetaminophen (TYLENOL) 325 MG tablet Take 325-650 mg by mouth every 8 (eight) hours as needed (for pain or cramping).     [provider]  amLODipine (NORVASC) 5 MG tablet TAKE 1 TABLET(5 MG) BY MOUTH DAILY 06/14/18   Isaiah Serge, NP  ARIPiprazole (ABILIFY) 10 MG tablet Take 1 tablet (10 mg total) by mouth daily. For mood control Patient taking differently: Take 10 mg by mouth daily.  09/01/17   Money, Lowry Ram, FNP  aspirin 81 MG chewable tablet Chew 1 tablet (81 mg total) by mouth daily. 09/16/14   Nita Sells, MD  atorvastatin (LIPITOR) 80 MG tablet TAKE 1 TABLET(80 MG) BY MOUTH DAILY Patient taking differently: TAKE 1 TABLET(80 MG) BY MOUTH in the evening 02/28/18   Hilty, Nadean Corwin, MD  baclofen (LIORESAL) 10 MG tablet Take up to 4/day for muscle spasms Patient taking differently: Take 10-20 mg by mouth See admin instructions. 10 mg in the morning then 10 mg in the afternoon then 20 mg at bedtime 03/12/17   Sater, Nanine Means, MD  clonazePAM (KLONOPIN) 1 MG tablet Take 0.5 mg by mouth every evening. 08/23/17   [provider]  folic acid (FOLVITE) 1 MG tablet Take 1 mg by mouth daily.    [provider]  furosemide (LASIX) 20 MG tablet TAKE 1 TABLET(20 MG) BY MOUTH DAILY AS NEEDED Patient taking differently: Take 20 mg by mouth once a day as needed for obvious swelling 05/28/17   Hilty, Nadean Corwin, MD  gabapentin (NEURONTIN) 300 MG capsule Take 1 capsule (300 mg total) by mouth 2 (two) times daily. 08/31/17   Money, Lowry Ram, FNP  hydrALAZINE (APRESOLINE) 25 MG tablet Take 1 tablet (25 mg total) by mouth 3  (three) times daily. 07/09/18 10/07/18  Barrett, Evelene Croon, PA-C  hydrOXYzine (ATARAX/VISTARIL) 25 MG tablet Take 1 tablet (25 mg total) by mouth every 6 (six) hours as needed for anxiety. 08/31/17   Money, Lowry Ram, FNP  lisinopril (PRINIVIL,ZESTRIL) 10 MG tablet Take 10 mg by mouth 2 (two) times daily.    [provider]  metoprolol tartrate (LOPRESSOR) 50 MG tablet Take 0.5 tablets (25 mg total) by mouth 2 (two) times daily. 06/14/18   Hilty, Nadean Corwin, MD  nitroGLYCERIN (NITROSTAT) 0.4 MG SL tablet Place 0.4 mg under the tongue every 5 (five) minutes as needed for chest pain.    [provider]  pantoprazole (PROTONIX) 40 MG tablet Take 1 tablet (40 mg total) by mouth daily. 03/18/18   Ward, Ozella Almond, PA-C  sertraline (ZOLOFT) 50 MG tablet Take 1 tablet (50 mg total) by mouth daily. For mood control Patient taking differently: Take 50 mg by mouth daily.  09/01/17   Money, Lowry Ram, FNP  tamoxifen (NOLVADEX) 20 MG tablet TAKE 1 TABLET(20 MG) BY MOUTH DAILY 03/28/18   Nicholas Lose, MD  traZODone (DESYREL) 50 MG tablet Take 1 tablet (50 mg total) by mouth at bedtime as needed for sleep. 08/31/17   Money, Lowry Ram, FNP    Family History Family History  Problem Relation Age of Onset  . Diabetes Mother   . Heart disease Mother   . Hyperlipidemia Mother   . Hypertension Mother   . Lung cancer Father   . Drug abuse Father   . Brain cancer Father 32  . Bone cancer Father 55  . Drug abuse Sister   . Mental illness Brother   . Mental illness Maternal Grandmother   . Stomach cancer Paternal Grandmother 24       d.75    Social History Social History   Tobacco Use  . Smoking status: Former Smoker    Packs/day: 0.50    Years: 30.00    Pack years: 15.00    Types: Cigarettes    Last attempt to quit: 10/05/2015    Years since quitting: 2.8  . Smokeless tobacco: Never Used  Substance Use Topics  . Alcohol use: Yes    Alcohol/week: 2.0 standard drinks    Types: 2 Cans  of beer per week  . Drug use: No     Allergies  Pork-derived products   Review of Systems Review of Systems  All other systems reviewed and are negative.    Physical Exam Updated Vital Signs BP (!) 161/82 (BP Location: Left Arm)   Pulse 70   Temp 97.9 F (36.6 C) (Oral)   Resp 18   SpO2 100%   Physical Exam  Constitutional: She is oriented to person, place, and time. She appears well-developed and well-nourished.  HENT:  Head: Normocephalic and atraumatic.  Neck: Normal range of motion. Neck supple.  Cardiovascular: Normal rate, regular rhythm, intact distal pulses and normal pulses.  Pulmonary/Chest: Effort normal and breath sounds normal.  Abdominal: Soft. Bowel sounds are normal.  Musculoskeletal: Normal range of motion.  Neurological: She is alert and oriented to person, place, and time.  Skin: Skin is warm. Capillary refill takes less than 2 seconds.  Psychiatric: She has a normal mood and affect.  Nursing note and vitals reviewed.    ED Treatments / Results  Labs (all labs ordered are listed, but only abnormal results are displayed) Labs Reviewed  BASIC METABOLIC PANEL - Abnormal; Notable for the following components:      Result Value   CO2 21 (*)    Glucose, Bld 115 (*)    Creatinine, Ser 1.28 (*)    GFR calc non Af Amer 49 (*)    GFR calc Af Amer 57 (*)    All other components within normal limits  CBC - Abnormal; Notable for the following components:   Hemoglobin 10.8 (*)    HCT 34.1 (*)    RDW 17.1 (*)    Platelets 427 (*)    All other components within normal limits  I-STAT TROPONIN, ED  I-STAT BETA HCG BLOOD, ED ([facility], [facility], AP ONLY)    EKG EKG Interpretation  Date/Time:  Saturday July 27 2018 19:51:01 EDT Ventricular Rate:  69 PR Interval:  114 QRS Duration: 64 QT Interval:  404 QTC Calculation: 432 R Axis:   59 Text Interpretation:  Normal sinus rhythm Normal ECG Confirmed by Pattricia Boss [phone]) on 07/27/2018 9:59:34  PM   Radiology Dg Chest 2 View  Result Date: 07/27/2018 CLINICAL DATA:  Chest pain, history of myocardial infarction 1 year ago. Difficulty controlling blood pressure. EXAM: CHEST - 2 VIEW COMPARISON:  09/26/2017 FINDINGS: The heart size and mediastinal contours are within normal limits. Both lungs are clear. Right breast and axillary clips are redemonstrated. The visualized skeletal structures are unremarkable. IMPRESSION: No active cardiopulmonary disease. Electronically Signed   By: Ashley Royalty M.D.   On: 07/27/2018 20:15    Procedures Procedures (including critical care time)  Medications Ordered in ED Medications - No data to display   Initial Impression / Assessment and Plan / ED Course  I have reviewed the triage vital signs and the nursing notes.  Pertinent labs & imaging results that were available during my care of the patient were reviewed by me and considered in my medical decision making (see chart for details).    CRITICAL CARE Performed by: Pattricia Boss Total critical care time: 35 minutes Critical care time was exclusive of separately billable procedures and treating other patients. Critical care was necessary to treat or prevent imminent or life-threatening deterioration. Critical care was time spent personally by me on the following activities: development of treatment plan with patient and/or surrogate as well as nursing, discussions with consultants, evaluation of patient's response to treatment, examination of patient, obtaining history from patient or surrogate, ordering and performing treatments and interventions,  ordering and review of laboratory studies, ordering and review of radiographic studies, pulse oximetry and re-evaluation of patient's condition.  47 yo female known cad, hypertension, hyperlipidemia, presents today with ongoing hypertension and chest pain, headache.  Plan iv nitro, will get cardiology recommendations. IV nitro intitiated with bp  decreased to 110 and pain decreased to 4/10.   Discussed results with patient and cardiology fellow.  She will see for admission. Final Clinical Impressions(s) / ED Diagnoses   Final diagnoses:  Chest pain, unspecified type  Hypertension, unspecified type  Nonintractable headache, unspecified chronicity pattern, unspecified headache type    ED Discharge Orders    None       Pattricia Boss, MD 07/27/18 [phone]

## 2018-07-27 NOTE — ED Notes (Signed)
Pt continue to c/o chest pain, NT made this RN aware.

## 2018-07-27 NOTE — ED Triage Notes (Signed)
Pt has hx of MI approximately 1 year ago. Recent problems controlling her hypertension. Has followed up with her cardiologist. Today has been unable to get out of bed. Centralized stabbing chest pain. Nauseated. Headache. No shob or diaphoresis. In bed all day today.

## 2018-07-27 NOTE — Telephone Encounter (Signed)
Received page to return call to patient. Patient reports that since initiation of hydralazine she has had terrible migraine headaches. Reports that her blood pressure initially improved but has elevated again to the 062B systolic. Headache has continued to worsen and has been confined to bed all day today. No history of headaches and patient reports this is the worst headache she's ever had. Denies chest pain, shortness of breath, orthopnea, PND. She considered stopping hydralazine but did not want to stop it abruptly without discussing with a physician. She denies any focal neurologic deficits including numbness, weakness, speech difficulties, changes in vision, facial droop.   We discussed concerns regarding her elevated blood pressure and severe headache. It is reassuring that she does not have any focal neurologic abnormalities but given the severity of the headache and her significant hypertension I advised she should present to the emergency room for further evaluation. She noted understanding and will have a family member drive her to the ED.  Bryna Colander, MD

## 2018-07-28 ENCOUNTER — Inpatient Hospital Stay (HOSPITAL_COMMUNITY): Payer: No Typology Code available for payment source

## 2018-07-28 DIAGNOSIS — N182 Chronic kidney disease, stage 2 (mild): Secondary | ICD-10-CM | POA: Diagnosis present

## 2018-07-28 DIAGNOSIS — Z87891 Personal history of nicotine dependence: Secondary | ICD-10-CM | POA: Diagnosis not present

## 2018-07-28 DIAGNOSIS — Z955 Presence of coronary angioplasty implant and graft: Secondary | ICD-10-CM | POA: Diagnosis not present

## 2018-07-28 DIAGNOSIS — I5030 Unspecified diastolic (congestive) heart failure: Secondary | ICD-10-CM | POA: Diagnosis not present

## 2018-07-28 DIAGNOSIS — Z808 Family history of malignant neoplasm of other organs or systems: Secondary | ICD-10-CM | POA: Diagnosis not present

## 2018-07-28 DIAGNOSIS — F319 Bipolar disorder, unspecified: Secondary | ICD-10-CM | POA: Diagnosis present

## 2018-07-28 DIAGNOSIS — R51 Headache: Secondary | ICD-10-CM

## 2018-07-28 DIAGNOSIS — I2510 Atherosclerotic heart disease of native coronary artery without angina pectoris: Secondary | ICD-10-CM | POA: Diagnosis present

## 2018-07-28 DIAGNOSIS — Z800 Family history of malignant neoplasm of digestive organs: Secondary | ICD-10-CM | POA: Diagnosis not present

## 2018-07-28 DIAGNOSIS — K219 Gastro-esophageal reflux disease without esophagitis: Secondary | ICD-10-CM | POA: Diagnosis present

## 2018-07-28 DIAGNOSIS — R079 Chest pain, unspecified: Secondary | ICD-10-CM

## 2018-07-28 DIAGNOSIS — G43909 Migraine, unspecified, not intractable, without status migrainosus: Secondary | ICD-10-CM | POA: Diagnosis present

## 2018-07-28 DIAGNOSIS — Z170 Estrogen receptor positive status [ER+]: Secondary | ICD-10-CM | POA: Diagnosis not present

## 2018-07-28 DIAGNOSIS — I129 Hypertensive chronic kidney disease with stage 1 through stage 4 chronic kidney disease, or unspecified chronic kidney disease: Secondary | ICD-10-CM | POA: Diagnosis present

## 2018-07-28 DIAGNOSIS — Z923 Personal history of irradiation: Secondary | ICD-10-CM | POA: Diagnosis not present

## 2018-07-28 DIAGNOSIS — I161 Hypertensive emergency: Secondary | ICD-10-CM

## 2018-07-28 DIAGNOSIS — I160 Hypertensive urgency: Principal | ICD-10-CM | POA: Diagnosis present

## 2018-07-28 DIAGNOSIS — R0789 Other chest pain: Secondary | ICD-10-CM | POA: Diagnosis present

## 2018-07-28 DIAGNOSIS — E785 Hyperlipidemia, unspecified: Secondary | ICD-10-CM | POA: Diagnosis present

## 2018-07-28 DIAGNOSIS — Z833 Family history of diabetes mellitus: Secondary | ICD-10-CM | POA: Diagnosis not present

## 2018-07-28 DIAGNOSIS — I252 Old myocardial infarction: Secondary | ICD-10-CM | POA: Diagnosis not present

## 2018-07-28 DIAGNOSIS — C50511 Malignant neoplasm of lower-outer quadrant of right female breast: Secondary | ICD-10-CM | POA: Diagnosis present

## 2018-07-28 DIAGNOSIS — Z8249 Family history of ischemic heart disease and other diseases of the circulatory system: Secondary | ICD-10-CM | POA: Diagnosis not present

## 2018-07-28 DIAGNOSIS — G8929 Other chronic pain: Secondary | ICD-10-CM | POA: Diagnosis present

## 2018-07-28 DIAGNOSIS — Z8349 Family history of other endocrine, nutritional and metabolic diseases: Secondary | ICD-10-CM | POA: Diagnosis not present

## 2018-07-28 DIAGNOSIS — F419 Anxiety disorder, unspecified: Secondary | ICD-10-CM | POA: Diagnosis present

## 2018-07-28 DIAGNOSIS — I421 Obstructive hypertrophic cardiomyopathy: Secondary | ICD-10-CM | POA: Diagnosis present

## 2018-07-28 DIAGNOSIS — Z9089 Acquired absence of other organs: Secondary | ICD-10-CM | POA: Diagnosis not present

## 2018-07-28 DIAGNOSIS — Z801 Family history of malignant neoplasm of trachea, bronchus and lung: Secondary | ICD-10-CM | POA: Diagnosis not present

## 2018-07-28 LAB — BASIC METABOLIC PANEL
Anion gap: 7 (ref 5–15)
BUN: 13 mg/dL (ref 6–20)
CO2: 18 mmol/L — ABNORMAL LOW (ref 22–32)
Calcium: 8.4 mg/dL — ABNORMAL LOW (ref 8.9–10.3)
Chloride: 112 mmol/L — ABNORMAL HIGH (ref 98–111)
Creatinine, Ser: 1.27 mg/dL — ABNORMAL HIGH (ref 0.44–1.00)
GFR calc Af Amer: 58 mL/min — ABNORMAL LOW (ref >60)
GFR calc non Af Amer: 50 mL/min — ABNORMAL LOW (ref >60)
Glucose, Bld: 99 mg/dL (ref 70–99)
Potassium: 4.8 mmol/L (ref 3.5–5.1)
Sodium: 137 mmol/L (ref 135–145)

## 2018-07-28 LAB — CBC
HCT: 29.9 % — ABNORMAL LOW (ref 36.0–46.0)
HCT: 29.2 % — ABNORMAL LOW (ref 36.0–46.0)
Hemoglobin: 9.7 g/dL — ABNORMAL LOW (ref 12.0–15.0)
Hemoglobin: 9.2 g/dL — ABNORMAL LOW (ref 12.0–15.0)
MCH: 27.7 pg (ref 26.0–34.0)
MCH: 26.7 pg (ref 26.0–34.0)
MCHC: 32.4 g/dL (ref 30.0–36.0)
MCHC: 31.5 g/dL (ref 30.0–36.0)
MCV: 85.4 fL (ref 80.0–100.0)
MCV: 84.9 fL (ref 80.0–100.0)
Platelets: 353 10*3/uL (ref 150–400)
Platelets: 334 10*3/uL (ref 150–400)
RBC: 3.5 MIL/uL — ABNORMAL LOW (ref 3.87–5.11)
RBC: 3.44 MIL/uL — ABNORMAL LOW (ref 3.87–5.11)
RDW: 17.2 % — ABNORMAL HIGH (ref 11.5–15.5)
RDW: 17.2 % — ABNORMAL HIGH (ref 11.5–15.5)
WBC: 8.1 10*3/uL (ref 4.0–10.5)
WBC: 7.1 10*3/uL (ref 4.0–10.5)
nRBC: 0 % (ref 0.0–0.2)
nRBC: 0 % (ref 0.0–0.2)

## 2018-07-28 LAB — CREATININE, SERUM
Creatinine, Ser: 1.24 mg/dL — ABNORMAL HIGH (ref 0.44–1.00)
GFR calc Af Amer: 59 mL/min — ABNORMAL LOW (ref >60)
GFR calc non Af Amer: 51 mL/min — ABNORMAL LOW (ref >60)

## 2018-07-28 LAB — TROPONIN I
Troponin I: <0.03 ng/mL (ref <0.03)
Troponin I: <0.03 ng/mL (ref <0.03)
Troponin I: <0.03 ng/mL (ref <0.03)

## 2018-07-28 LAB — MRSA PCR SCREENING: MRSA by PCR: NEGATIVE

## 2018-07-28 LAB — HIV ANTIBODY (ROUTINE TESTING W REFLEX): HIV Screen 4th Generation wRfx: NONREACTIVE

## 2018-07-28 MED ORDER — SERTRALINE HCL 50 MG PO TABS
50.0000 mg | ORAL_TABLET | Freq: Every day | ORAL | Status: DC
  Administered 2018-07-28 – 2018-07-29 (×2): 50 mg via ORAL
  Filled 2018-07-28 (×2): qty 1

## 2018-07-28 MED ORDER — LISINOPRIL 10 MG PO TABS
10.0000 mg | ORAL_TABLET | Freq: Two times a day (BID) | ORAL | Status: DC
  Administered 2018-07-28 – 2018-07-29 (×4): 10 mg via ORAL
  Filled 2018-07-28 (×4): qty 1

## 2018-07-28 MED ORDER — OXYCODONE HCL 5 MG PO TABS
5.0000 mg | ORAL_TABLET | Freq: Once | ORAL | Status: AC
  Administered 2018-07-28: 5 mg via ORAL
  Filled 2018-07-28: qty 1

## 2018-07-28 MED ORDER — ASPIRIN 300 MG RE SUPP: 300.0000 mg | RECTAL | Status: DC

## 2018-07-28 MED ORDER — ONDANSETRON HCL 4 MG/2ML IJ SOLN
4.0000 mg | Freq: Four times a day (QID) | INTRAMUSCULAR | Status: DC | PRN
  Administered 2018-07-28 – 2018-07-29 (×2): 4 mg via INTRAVENOUS
  Filled 2018-07-28 (×2): qty 2

## 2018-07-28 MED ORDER — METOCLOPRAMIDE HCL 5 MG/ML IJ SOLN
10.0000 mg | Freq: Once | INTRAMUSCULAR | Status: AC
  Administered 2018-07-28: 10 mg via INTRAVENOUS
  Filled 2018-07-28: qty 2

## 2018-07-28 MED ORDER — NITROGLYCERIN IN D5W 200-5 MCG/ML-% IV SOLN
0.0000 ug/min | Freq: Once | INTRAVENOUS | Status: AC
  Administered 2018-07-28 (×2): 25 ug/min via INTRAVENOUS

## 2018-07-28 MED ORDER — BACLOFEN 20 MG PO TABS
20.0000 mg | ORAL_TABLET | Freq: Every day | ORAL | Status: DC
  Administered 2018-07-28: 20 mg via ORAL
  Filled 2018-07-28: qty 1

## 2018-07-28 MED ORDER — ASPIRIN EC 81 MG PO TBEC
81.0000 mg | DELAYED_RELEASE_TABLET | Freq: Every day | ORAL | Status: DC
  Administered 2018-07-29: 81 mg via ORAL
  Filled 2018-07-28: qty 1

## 2018-07-28 MED ORDER — AMLODIPINE BESYLATE 5 MG PO TABS
5.0000 mg | ORAL_TABLET | Freq: Every day | ORAL | Status: DC
  Administered 2018-07-28 – 2018-07-29 (×2): 5 mg via ORAL
  Filled 2018-07-28 (×2): qty 1

## 2018-07-28 MED ORDER — FOLIC ACID 1 MG PO TABS
1.0000 mg | ORAL_TABLET | Freq: Every day | ORAL | Status: DC
  Administered 2018-07-28 – 2018-07-29 (×2): 1 mg via ORAL
  Filled 2018-07-28 (×2): qty 1

## 2018-07-28 MED ORDER — NITROGLYCERIN 0.4 MG SL SUBL: 0.4000 mg | SUBLINGUAL_TABLET | SUBLINGUAL | Status: DC | PRN

## 2018-07-28 MED ORDER — ASPIRIN 81 MG PO CHEW: 324.0000 mg | CHEWABLE_TABLET | ORAL | Status: DC

## 2018-07-28 MED ORDER — ACETAMINOPHEN 325 MG PO TABS
650.0000 mg | ORAL_TABLET | Freq: Once | ORAL | Status: AC
  Administered 2018-07-28: 650 mg via ORAL
  Filled 2018-07-28: qty 2

## 2018-07-28 MED ORDER — ARIPIPRAZOLE 5 MG PO TABS
10.0000 mg | ORAL_TABLET | Freq: Every day | ORAL | Status: DC
  Administered 2018-07-28 – 2018-07-29 (×2): 10 mg via ORAL
  Filled 2018-07-28 (×2): qty 2

## 2018-07-28 MED ORDER — ACETAMINOPHEN 325 MG PO TABS
650.0000 mg | ORAL_TABLET | ORAL | Status: DC | PRN
  Administered 2018-07-28 – 2018-07-29 (×4): 650 mg via ORAL
  Filled 2018-07-28 (×4): qty 2

## 2018-07-28 MED ORDER — HYDRALAZINE HCL 25 MG PO TABS
25.0000 mg | ORAL_TABLET | Freq: Three times a day (TID) | ORAL | Status: DC
  Administered 2018-07-28 – 2018-07-29 (×4): 25 mg via ORAL
  Filled 2018-07-28 (×4): qty 1

## 2018-07-28 MED ORDER — PANTOPRAZOLE SODIUM 40 MG PO TBEC
40.0000 mg | DELAYED_RELEASE_TABLET | Freq: Every day | ORAL | Status: DC
  Administered 2018-07-28 – 2018-07-29 (×2): 40 mg via ORAL
  Filled 2018-07-28 (×2): qty 1

## 2018-07-28 MED ORDER — CLONAZEPAM 0.5 MG PO TABS
0.5000 mg | ORAL_TABLET | Freq: Every evening | ORAL | Status: DC
  Administered 2018-07-28: 0.5 mg via ORAL
  Filled 2018-07-28: qty 1

## 2018-07-28 MED ORDER — CARVEDILOL 12.5 MG PO TABS
12.5000 mg | ORAL_TABLET | Freq: Two times a day (BID) | ORAL | Status: DC
  Administered 2018-07-28 – 2018-07-29 (×3): 12.5 mg via ORAL
  Filled 2018-07-28 (×3): qty 1

## 2018-07-28 MED ORDER — GABAPENTIN 300 MG PO CAPS
300.0000 mg | ORAL_CAPSULE | Freq: Two times a day (BID) | ORAL | Status: DC
  Administered 2018-07-28 – 2018-07-29 (×4): 300 mg via ORAL
  Filled 2018-07-28 (×4): qty 1

## 2018-07-28 MED ORDER — TAMOXIFEN CITRATE 10 MG PO TABS
20.0000 mg | ORAL_TABLET | Freq: Every day | ORAL | Status: DC
  Administered 2018-07-28 – 2018-07-29 (×2): 20 mg via ORAL
  Filled 2018-07-28 (×2): qty 2

## 2018-07-28 MED ORDER — ATORVASTATIN CALCIUM 80 MG PO TABS
80.0000 mg | ORAL_TABLET | Freq: Every day | ORAL | Status: DC
  Administered 2018-07-28: 80 mg via ORAL
  Filled 2018-07-28: qty 1

## 2018-07-28 MED ORDER — BACLOFEN 10 MG PO TABS
10.0000 mg | ORAL_TABLET | ORAL | Status: DC
  Administered 2018-07-28 – 2018-07-29 (×4): 10 mg via ORAL
  Filled 2018-07-28 (×4): qty 1

## 2018-07-28 MED ORDER — NITROGLYCERIN IN D5W 200-5 MCG/ML-% IV SOLN: 2.0000 ug/min | INTRAVENOUS | Status: DC

## 2018-07-28 MED ORDER — TRAZODONE HCL 50 MG PO TABS: 50.0000 mg | ORAL_TABLET | Freq: Every evening | ORAL | Status: DC | PRN

## 2018-07-28 NOTE — ED Notes (Signed)
Pt. Return from CT via stretcher. 

## 2018-07-28 NOTE — Progress Notes (Signed)
Progress Note  Patient Name: Anna Henson Date of Encounter: 07/28/2018  Primary Cardiologist: Pixie Casino, MD    Subjective   Much better no headache BP almost too low this am   Inpatient Medications    Scheduled Meds: . amLODipine  5 mg Oral Daily  . ARIPiprazole  10 mg Oral Daily  . [START ON 07/29/2018] aspirin EC  81 mg Oral Daily  . atorvastatin  80 mg Oral q1800  . baclofen  10 mg Oral 2 times per day  . baclofen  20 mg Oral QHS  . carvedilol  12.5 mg Oral BID WC  . clonazePAM  0.5 mg Oral QPM  . folic acid  1 mg Oral Daily  . gabapentin  300 mg Oral BID  . hydrALAZINE  25 mg Oral TID  . lisinopril  10 mg Oral BID  . pantoprazole  40 mg Oral Daily  . sertraline  50 mg Oral Daily  . tamoxifen  20 mg Oral Daily   Continuous Infusions: . nitroGLYCERIN 25 mcg/min (07/28/18 0800)   PRN Meds: acetaminophen, ondansetron (ZOFRAN) IV, traZODone   Vital Signs    Vitals:   07/28/18 0605 07/28/18 0623 07/28/18 0812 07/28/18 0822  BP: 93/60 (!) 97/55 (!) 98/55 (!) 98/55  Pulse: (!) 58  63 63  Resp:      Temp:   99.8 F (37.7 C)   TempSrc:   Oral   SpO2:   97%   Weight:      Height:        Intake/Output Summary (Last 24 hours) at 07/28/2018 0902 Last data filed at 07/28/2018 0108 Gross per 24 hour  Intake 18.47 ml  Output -  Net 18.47 ml   Filed Weights   07/28/18 0152  Weight: 70.4 kg    Telemetry    NSR 07/28/2018  - Personally Reviewed  ECG    NSR no acute changes  - Personally Reviewed  Physical Exam  Black female  GEN: No acute distress.   Neck: No JVD Cardiac: RRR, SEM  murmurs, rubs, or gallops.  Respiratory: Clear to auscultation bilaterally. GI: Soft, nontender, non-distended  MS: No edema; No deformity. Neuro:  Nonfocal  Psych: Normal affect   Labs    Chemistry Recent Labs  Lab 07/27/18 2005 07/28/18 0203 07/28/18 0558  NA 139  --  137  K 3.8  --  4.8  CL 111  --  112*  CO2 21*  --  18*  GLUCOSE 115*  --  99    BUN 13  --  13  CREATININE 1.28* 1.24* 1.27*  CALCIUM 9.1  --  8.4*  GFRNONAA 49* 51* 50*  GFRAA 57* 59* 58*  ANIONGAP 7  --  7     Hematology Recent Labs  Lab 07/27/18 2005 07/28/18 0203 07/28/18 0558  WBC 9.8 8.1 7.1  RBC 4.03 3.50* 3.44*  HGB 10.8* 9.7* 9.2*  HCT 34.1* 29.9* 29.2*  MCV 84.6 85.4 84.9  MCH 26.8 27.7 26.7  MCHC 31.7 32.4 31.5  RDW 17.1* 17.2* 17.2*  PLT 427* 353 334    Cardiac Enzymes Recent Labs  Lab 07/28/18 0203 07/28/18 0558  TROPONINI <0.03 <0.03    Recent Labs  Lab 07/27/18 2013  TROPIPOC 0.00     BNPNo results for input(s): BNP, PROBNP in the last 168 hours.   DDimer No results for input(s): DDIMER in the last 168 hours.   Radiology    Dg Chest 2 View  Result  Date: 07/27/2018 CLINICAL DATA:  Chest pain, history of myocardial infarction 1 year ago. Difficulty controlling blood pressure. EXAM: CHEST - 2 VIEW COMPARISON:  09/26/2017 FINDINGS: The heart size and mediastinal contours are within normal limits. Both lungs are clear. Right breast and axillary clips are redemonstrated. The visualized skeletal structures are unremarkable. IMPRESSION: No active cardiopulmonary disease. Electronically Signed   By: Ashley Royalty M.D.   On: 07/27/2018 20:15   Ct Head Wo Contrast  Result Date: 07/28/2018 CLINICAL DATA:  Headache.  History of hypertension. EXAM: CT HEAD WITHOUT CONTRAST TECHNIQUE: Contiguous axial images were obtained from the base of the skull through the vertex without intravenous contrast. COMPARISON:  None. FINDINGS: BRAIN: The ventricles and sulci are normal. No intraparenchymal hemorrhage, mass effect nor midline shift. No acute large vascular territory infarcts. Idiopathic basal ganglial calcifications are noted bilaterally. Grey-white matter distinction is maintained. No abnormal extra-axial fluid collections. Basal cisterns are not effaced and midline. The brainstem and cerebellar hemispheres are without acute abnormalities.  VASCULAR: Mild atherosclerosis of the carotid siphons. SKULL/SOFT TISSUES: No skull fracture. No significant soft tissue swelling. ORBITS/SINUSES: The included ocular globes and orbital contents are normal.The mastoid air cells are clear. The included paranasal sinuses are well-aerated. OTHER: None. IMPRESSION: No acute intracranial abnormality.  Atherosclerotic carotid siphons. Electronically Signed   By: Ashley Royalty M.D.   On: 07/28/2018 01:35    Cardiac Studies   TTE EF 60-65% 09/27/17  Patient Profile     47 y.o. female admitted with hypertensive urgency, headache and atypical chest pain History of PCI/Stent RCA in March 2018   Assessment & Plan    HTN:  Improved she indicates compliance with home meds D/C iv nitro  CAD:  R/O no pain this am likely from elevated BP no indication for cath or stress testing at this time Headache:  Resolved CT head normal       For questions or updates, please contact [facility] Please consult [email or web address] for contact info under        Signed, Jenkins Rouge, MD  07/28/2018, 9:02 AM

## 2018-07-28 NOTE — H&P (Signed)
History & Physical    Patient ID: Anna Henson MRN: [id number], DOB/AGE: 1970-11-19   Admit date: 07/27/2018   Primary Physician: Patient, No Pcp Per Primary Cardiologist: Pixie Casino, MD  Patient Profile    Anna Henson is a 47 year old woman with CAD s/p PCI fo the RCA in March 2018, HTN, HL, chronic tobacco abuse, CKD who presents with severe headache and chest pain.   Past Medical History    Past Medical History:  Diagnosis Date  . Anxiety   . Bipolar disorder ([place])   . Breast cancer, right breast ([place]) ~ 03/2017  . CAD in native artery    a. 09/2014 Cath/PCI in setting of [place] - s/p 2.25 x 12 mm Promus Premier DES to the RCA;  b. 11/2016 Myoview: EF 56%, no ischemia;  c. 12/2016 NSTEMI/PCI: LM nl, LAd 25p, LCX 30p, RCA 100p (2.75x38 Promus Premier DES), mRCA 10 ISR, EF 50-55%.  . Chronic kidney disease    she sthinks has stage 2 CKD, has had US doppler and has some stenosis , this was done in [place]   . CKD (chronic kidney disease), stage II   . Depression   . Dysrhythmia    Tachycardia in evenings  . Genetic testing 04/23/2017   Anna Henson underwent genetic counseling and testing for hereditary cancer syndromes on 04/05/2017. Her results were negative for mutations in all 46 genes analyzed by [facility]'s 46-gene Common Hereditary Cancers Panel. Genes analyzed include: APC, ATM, AXIN2, BARD1, BMPR1A, BRCA1, BRCA2, BRIP1, CDH1, CDKN2A, CHEK2, CTNNA1, DICER1, EPCAM, GREM1, HOXB13, KIT, MEN1, MLH1, MSH2, MSH3, MSH6, MUTYH, NBN,  . GERD (gastroesophageal reflux disease)   . Headache    "bi-weekly" (05/07/2017)  . Heart attack ([place]) 12/31/2016  . HOCM (hypertrophic obstructive cardiomyopathy) ([place])    a. 12/2016 Echo: EF 65-70%,  mod conc LVH, dynamic obstruction @ rest, peak velocity of 291 cm/sec w/ peak gradient of 20mHg, no rwma, Gr1 DD, triv TR, PASP 146mg.  . Marland Kitchenyperlipidemia   . Hypertension    > 5 years  . Migraine    "probably 1/month" (05/07/2017)  . Palpitations    . Pneumonia    "twice when I was a child" (05/07/2017)  . Tobacco abuse 09/13/2014  . Transverse myelitis ([facility]    Past Surgical History:  Procedure Laterality Date  . ABDOMINAL HERNIA REPAIR  ~ 2001; ~ 2005  . BREAST BIOPSY Right ~ 03/2017 X 2  . BREAST LUMPECTOMY WITH RADIOACTIVE SEED AND SENTINEL LYMPH NODE BIOPSY Right 06/21/2017   Procedure: RIGHT BREAST BRACKETED SEED GUIDED LUMPECTOMY AND SENTINEL LYMPH NODE BIOPSY;  Surgeon: ToJovita KussmaulMD;  [facility] Service: General;  Laterality: Right;  . CORONARY ANGIOPLASTY WITH STENT PLACEMENT     L main OK, LAD OK, CFX 30%, RCA 90%-0% w/ 2.25 x 12 mm Promus Premier DES, EF 55%  . CORONARY/GRAFT ACUTE MI REVASCULARIZATION N/A 12/31/2016   Procedure: Coronary/Graft Acute MI Revascularization;  Surgeon: MiSherren MochaMD;  [facility];  Service: Cardiovascular;  Laterality: N/A;  . HERNIA REPAIR     times 2  umbilical  . LEFT HEART CATH AND CORONARY ANGIOGRAPHY N/A 12/31/2016   Procedure: Left Heart Cath and Coronary Angiography;  Surgeon: MiSherren MochaMD;  [facility];  Service: Cardiovascular;  Laterality: N/A;  . LEFT HEART CATHETERIZATION WITH CORONARY ANGIOGRAM N/A 09/14/2014   Procedure: LEFT HEART CATHETERIZATION WITH CORONARY ANGIOGRAM;  Surgeon: ChBurnell BlanksMD;  Location: [facility] CATH LAB;  Service: Cardiovascular;  Laterality: N/A;  . PERCUTANEOUS CORONARY STENT INTERVENTION (PCI-S)  09/14/2014   Procedure: PERCUTANEOUS CORONARY STENT INTERVENTION (PCI-S);  Surgeon: Burnell Blanks, MD;  Location: [facility] CATH LAB;  Service: Cardiovascular;;  . TONSILLECTOMY  2005     Allergies  Allergies  Allergen Reactions  . Pork-Derived Products Other (See Comments)    Does NOT eat pork    History of Present Illness    Anna Henson is a 47 year old woman with CAD s/p PCI fo the RCA in March 2018, HTN, HL, chronic tobacco abuse, CKD who presents with severe headache and chest pain.    The patient reports that she has had ongoing difficulties with blood pressure control recently. She was last seen in cardiology clinic on 10/1 at which point hydralazine was started for blood pressure control. She also noted episodes of chest pain at that time and plan was for ongoing medical management at that time. The patient reports that her blood pressure initially improved with treatment with hydralazine, however has recently trended back up to the 267T systolic. For the past week she has had a severe migraine headache which significantly limits her ability to function. Reports she stayed in bed all day today. Due to this, she paged me this evening to discuss her symptoms. Given severe headache in setting of elevated BPs, advised the patient to present to the ED.  Patient reports that after we spoke, she initially tried to take a nap to see if headache would improve. Unfortunately, she then developed recurrent, severe substernal chest pain and thus she presented to the ED tonight. Pain is described as central stabbing. Similar to pain prior to PCI in March. No shortness of breath, orthopnea, PND, LEE, palpitations, syncope. In the ED, she was started on a nitro drip for treatment of chest pain and hypertension. She reports significant improvement in chest pain at this time, with just a "twinge" remaining.   Home Medications    Prior to Admission medications   Medication Sig Start Date End Date Taking? Authorizing Provider  acetaminophen (TYLENOL) 325 MG tablet Take 325-650 mg by mouth every 8 (eight) hours as needed (for pain or cramping).    Yes [provider]  amLODipine (NORVASC) 5 MG tablet TAKE 1 TABLET(5 MG) BY MOUTH DAILY Patient taking differently: Take 5 mg by mouth daily.  06/14/18  Yes Isaiah Serge, NP  ARIPiprazole (ABILIFY) 10 MG tablet Take 1 tablet (10 mg total) by mouth daily. For mood control Patient taking differently: Take 10 mg by mouth daily.  09/01/17  Yes  Money, Lowry Ram, FNP  aspirin 81 MG chewable tablet Chew 1 tablet (81 mg total) by mouth daily. 09/16/14  Yes Nita Sells, MD  atorvastatin (LIPITOR) 80 MG tablet TAKE 1 TABLET(80 MG) BY MOUTH DAILY Patient taking differently: Take by mouth daily at 6 PM.  02/28/18  Yes Hilty, Nadean Corwin, MD  baclofen (LIORESAL) 10 MG tablet Take up to 4/day for muscle spasms Patient taking differently: Take 10-20 mg by mouth See admin instructions. 10 mg in the morning then 10 mg in the afternoon then 20 mg at bedtime 03/12/17  Yes Sater, Nanine Means, MD  clonazePAM (KLONOPIN) 1 MG tablet Take 0.5 mg by mouth every evening. 08/23/17  Yes [provider]  folic acid (FOLVITE) 1 MG tablet Take 1 mg by mouth daily.   Yes [provider]  furosemide (LASIX) 20 MG tablet TAKE 1 TABLET(20 MG)  BY MOUTH DAILY AS NEEDED Patient taking differently: Take 20 mg by mouth daily as needed for fluid or edema.  05/28/17  Yes Hilty, Nadean Corwin, MD  gabapentin (NEURONTIN) 300 MG capsule Take 1 capsule (300 mg total) by mouth 2 (two) times daily. 08/31/17  Yes Money, Lowry Ram, FNP  hydrALAZINE (APRESOLINE) 25 MG tablet Take 1 tablet (25 mg total) by mouth 3 (three) times daily. 07/09/18 10/07/18 Yes Barrett, Evelene Croon, PA-C  lisinopril (PRINIVIL,ZESTRIL) 10 MG tablet Take 10 mg by mouth 2 (two) times daily.   Yes [provider]  metoprolol tartrate (LOPRESSOR) 50 MG tablet Take 0.5 tablets (25 mg total) by mouth 2 (two) times daily. 06/14/18  Yes Hilty, Nadean Corwin, MD  nitroGLYCERIN (NITROSTAT) 0.4 MG SL tablet Place 0.4 mg under the tongue every 5 (five) minutes as needed for chest pain.   Yes [provider]  pantoprazole (PROTONIX) 40 MG tablet Take 1 tablet (40 mg total) by mouth daily. 03/18/18  Yes Ward, Ozella Almond, PA-C  sertraline (ZOLOFT) 50 MG tablet Take 1 tablet (50 mg total) by mouth daily. For mood control Patient taking differently: Take 50 mg by mouth daily.  09/01/17  Yes Money,  Lowry Ram, FNP  tamoxifen (NOLVADEX) 20 MG tablet TAKE 1 TABLET(20 MG) BY MOUTH DAILY Patient taking differently: Take 20 mg by mouth daily.  03/28/18  Yes Nicholas Lose, MD  traZODone (DESYREL) 50 MG tablet Take 1 tablet (50 mg total) by mouth at bedtime as needed for sleep. 08/31/17  Yes Money, Lowry Ram, FNP  hydrOXYzine (ATARAX/VISTARIL) 25 MG tablet Take 1 tablet (25 mg total) by mouth every 6 (six) hours as needed for anxiety. Patient not taking: Reported on 07/27/2018 08/31/17   Money, Lowry Ram, FNP    Family History    Family History  Problem Relation Age of Onset  . Diabetes Mother   . Heart disease Mother   . Hyperlipidemia Mother   . Hypertension Mother   . Lung cancer Father   . Drug abuse Father   . Brain cancer Father 3  . Bone cancer Father 29  . Drug abuse Sister   . Mental illness Brother   . Mental illness Maternal Grandmother   . Stomach cancer Paternal Grandmother 30       d.75   She indicated that her mother is alive. She indicated that her father is alive. She indicated that the status of her sister is unknown. She indicated that the status of her brother is unknown. She indicated that her maternal grandmother is deceased. She indicated that her maternal grandfather is deceased. She indicated that her paternal grandmother is deceased. She indicated that her paternal grandfather is deceased. She indicated that her maternal aunt is deceased. She indicated that her paternal uncle is deceased.   Social History    Social History   Socioeconomic History  . Marital status: Single    Spouse name: Not on file  . Number of children: 0  . Years of education: masters  . Highest education level: Not on file  Occupational History  . Occupation: Therapist, art at a call center    Employer: [employer]  . Financial resource strain: Somewhat hard  . Food insecurity:    Worry: Often true    Inability: Often true  . Transportation needs:    Medical: No     Non-medical: No  Tobacco Use  . Smoking status: Former Smoker    Packs/day: 0.50  Years: 30.00    Pack years: 15.00    Types: Cigarettes    Last attempt to quit: 10/05/2015    Years since quitting: 2.8  . Smokeless tobacco: Never Used  Substance and Sexual Activity  . Alcohol use: Yes    Alcohol/week: 2.0 standard drinks    Types: 2 Cans of beer per week  . Drug use: No  . Sexual activity: Not Currently  Lifestyle  . Physical activity:    Days per week: 1 day    Minutes per session: 10 min  . Stress: Very much  Relationships  . Social connections:    Talks on phone: More than three times a week    Gets together: Once a week    Attends religious service: 1 to 4 times per year    Active member of club or organization: No    Attends meetings of clubs or organizations: Never    Relationship status: Never married  . Intimate partner violence:    Fear of current or ex partner: No    Emotionally abused: No    Physically abused: No    Forced sexual activity: No  Other Topics Concern  . Not on file  Social History Narrative   Consumes 2 cups of caffeine daily     Review of Systems    General:  No chills, fever, night sweats or weight changes.  Cardiovascular:  No chest pain, dyspnea on exertion, edema, orthopnea, palpitations, paroxysmal nocturnal dyspnea. Dermatological: No rash, lesions/masses Respiratory: No cough, dyspnea Urologic: No hematuria, dysuria Abdominal:   No nausea, vomiting, diarrhea, bright red blood per rectum, melena, or hematemesis Neurologic:  No visual changes, wkns, changes in mental status. All other systems reviewed and are otherwise negative except as noted above.  Physical Exam    Blood pressure 108/67, pulse 64, temperature 97.9 F (36.6 C), temperature source Oral, resp. rate 16, SpO2 100 %.  General: Pleasant, NAD Psych: Normal affect. Neuro: Alert and oriented X 3. Moves all extremities spontaneously. HEENT: Normal  Neck: Supple  without bruits or JVD. Lungs:  Resp regular and unlabored, CTA. Heart: RRR no s3, s4. II/VI systolic murmur.  Abdomen: Soft, non-tender, non-distended, BS + x 4.  Extremities: No clubbing, cyanosis or edema. DP/PT/Radials 2+ and equal bilaterally.  Labs    Troponin [facility] Test) Recent Labs    07/27/18 2013  TROPIPOC 0.00   No results for input(s): CKTOTAL, CKMB, TROPONINI in the last 72 hours. Lab Results  Component Value Date   WBC 9.8 07/27/2018   HGB 10.8 (L) 07/27/2018   HCT 34.1 (L) 07/27/2018   MCV 84.6 07/27/2018   PLT 427 (H) 07/27/2018    Recent Labs  Lab 07/27/18 2005  NA 139  K 3.8  CL 111  CO2 21*  BUN 13  CREATININE 1.28*  CALCIUM 9.1  GLUCOSE 115*   Lab Results  Component Value Date   CHOL 149 07/09/2018   HDL 42 07/09/2018   LDLCALC 87 07/09/2018   TRIG 99 07/09/2018   Lab Results  Component Value Date   DDIMER 0.36 09/26/2017     Radiology Studies    Dg Chest 2 View  Result Date: 07/27/2018 CLINICAL DATA:  Chest pain, history of myocardial infarction 1 year ago. Difficulty controlling blood pressure. EXAM: CHEST - 2 VIEW COMPARISON:  09/26/2017 FINDINGS: The heart size and mediastinal contours are within normal limits. Both lungs are clear. Right breast and axillary clips are redemonstrated. The visualized skeletal structures  are unremarkable. IMPRESSION: No active cardiopulmonary disease. Electronically Signed   By: Ashley Royalty M.D.   On: 07/27/2018 20:15   Ct Head Wo Contrast  Result Date: 07/28/2018 CLINICAL DATA:  Headache.  History of hypertension. EXAM: CT HEAD WITHOUT CONTRAST TECHNIQUE: Contiguous axial images were obtained from the base of the skull through the vertex without intravenous contrast. COMPARISON:  None. FINDINGS: BRAIN: The ventricles and sulci are normal. No intraparenchymal hemorrhage, mass effect nor midline shift. No acute large vascular territory infarcts. Idiopathic basal ganglial calcifications are noted  bilaterally. Grey-white matter distinction is maintained. No abnormal extra-axial fluid collections. Basal cisterns are not effaced and midline. The brainstem and cerebellar hemispheres are without acute abnormalities. VASCULAR: Mild atherosclerosis of the carotid siphons. SKULL/SOFT TISSUES: No skull fracture. No significant soft tissue swelling. ORBITS/SINUSES: The included ocular globes and orbital contents are normal.The mastoid air cells are clear. The included paranasal sinuses are well-aerated. OTHER: None. IMPRESSION: No acute intracranial abnormality.  Atherosclerotic carotid siphons. Electronically Signed   By: Ashley Royalty M.D.   On: 07/28/2018 01:35    ECG & Cardiac Imaging    ECG - sinus rhythm. No acute ischemic changes - personally reviewed.   Cath 12/2016 1. Acute total occlusion of the RCA, treated successful with PCI using a 2.75x38 mm Promus DES, deployed in overlapping fashion with the old stent which was widely patent 2. Mild nonobstructive LAD/LCx stenosis 3. Mild segmental LV systolic dysfunction with severe hypokinesis of the basal and mid inferior walls, but vigorous contractility of the anterior and apical walls, LVEF estimated 50-55%  TTE 09/27/17 - Left ventricle: The cavity size was normal. Systolic function was   normal. The estimated ejection fraction was in the range of 60%   to 65%. Wall motion was normal; there were no regional wall   motion abnormalities. Left ventricular diastolic function   parameters were normal. - Aortic valve: Valve area (VTI): 2.3 cm^2. Valve area (Vmax): 2.35   cm^2. Valve area (Vmean): 2.32 cm^2. - Atrial septum: No defect or patent foramen ovale was identified.  Assessment & Plan    Ms. Bannister is a 47 year old woman with CAD s/p PCI fo the RCA in March 2019, HTN, HL, chronic tobacco abuse, CKD who presents with severe headache and chest pain.   # Chest pain # CAD Suspect that pain is related to severe hypertension, as was  previously discussed at last clinic visit. However, patient also with known coronary artery disease and with similar symptoms in March 2018 with occluded RCA.  - Continue nitroglycerin drip, titrate down with improvement in chest pain and Bps - TTE in the AM - Trend troponins, no evidence of MI at this time and ECG without ischemic changes - Cont statin as below - Consider LHC if ongoing symptoms despite treatment of hypertension.   # HTN Hypertensive emergency with symptoms of chest pain and headache. Creatinine mildly elevated at 1.3 as well. - Nitro drip as above - Cont home amlodipine 107m - would titrate to 157mdaily if ongoing BP management needed - Cont home hydralazine 25 mg TID - Cont home lisinopril 1056mID - Transition from metoprolol to carvedilol for improved BP control  # Headache Patient reports headache is more severe than any she has experienced. Does have migraines documented in her chart but reports that is not a current problem for her until this past week. No neurologic deficits. - CT head for further evaluation - BP management as above.   #  HL - Cont home atorvastatin 49m   # Bipolar/anxiety - Cont home abilify - Cont home sertraline - cont home trazodone  # Chronic pain - Cont home baclofen - Cont home gabapentin  # History of breast cancer - Cont home tamoxifen  # FULL CODE  Signed, ABryna Colander MD 07/28/2018, 1:41 AM

## 2018-07-29 ENCOUNTER — Inpatient Hospital Stay (HOSPITAL_COMMUNITY): Payer: No Typology Code available for payment source

## 2018-07-29 DIAGNOSIS — I5030 Unspecified diastolic (congestive) heart failure: Secondary | ICD-10-CM

## 2018-07-29 DIAGNOSIS — I2510 Atherosclerotic heart disease of native coronary artery without angina pectoris: Secondary | ICD-10-CM

## 2018-07-29 DIAGNOSIS — R0789 Other chest pain: Secondary | ICD-10-CM

## 2018-07-29 LAB — ECHOCARDIOGRAM COMPLETE
Height: 62 in
Weight: 2120 oz

## 2018-07-29 MED ORDER — CARVEDILOL 12.5 MG PO TABS: 12.5000 mg | ORAL_TABLET | Freq: Two times a day (BID) | ORAL | 3 refills | Status: DC

## 2018-07-29 NOTE — Progress Notes (Addendum)
Progress Note  Patient Name: Anna Henson Date of Encounter: 07/29/2018  Primary Cardiologist: Pixie Casino, MD   Subjective   Pt denies chest pain, has mild headache today, "nothing like it was". No shortness of breath. Has been up in the room without lightheadedness.   Inpatient Medications    Scheduled Meds: . amLODipine  5 mg Oral Daily  . ARIPiprazole  10 mg Oral Daily  . aspirin EC  81 mg Oral Daily  . atorvastatin  80 mg Oral q1800  . baclofen  10 mg Oral 2 times per day  . baclofen  20 mg Oral QHS  . carvedilol  12.5 mg Oral BID WC  . clonazePAM  0.5 mg Oral QPM  . folic acid  1 mg Oral Daily  . gabapentin  300 mg Oral BID  . hydrALAZINE  25 mg Oral TID  . lisinopril  10 mg Oral BID  . pantoprazole  40 mg Oral Daily  . sertraline  50 mg Oral Daily  . tamoxifen  20 mg Oral Daily   Continuous Infusions:  PRN Meds: acetaminophen, ondansetron (ZOFRAN) IV, traZODone   Vital Signs    Vitals:   07/28/18 2058 07/29/18 0451 07/29/18 0459 07/29/18 0626  BP: 131/76  121/68   Pulse: 74  72   Resp: 14     Temp: 98.2 F (36.8 C)  99 F (37.2 C)   TempSrc: Oral  Oral   SpO2: 94%  98%   Weight:  69.8 kg  60.1 kg  Height:        Intake/Output Summary (Last 24 hours) at 07/29/2018 0937 Last data filed at 07/29/2018 0450 Gross per 24 hour  Intake -  Output 400 ml  Net -400 ml   Filed Weights   07/28/18 0152 07/29/18 0451 07/29/18 0626  Weight: 70.4 kg 69.8 kg 60.1 kg    Telemetry    NSR at 60's - Personally Reviewed  ECG    NSR 71 bpm - Personally Reviewed  Physical Exam   GEN: No acute distress.   Neck: No JVD Cardiac: RRR, no murmurs, rubs, or gallops.  Respiratory: Clear to auscultation bilaterally. GI: Soft, nontender, non-distended  MS: No edema; No deformity. Neuro:  Nonfocal  Psych: Normal affect   Labs    Chemistry Recent Labs  Lab 07/27/18 2005 07/28/18 0203 07/28/18 0558  NA 139  --  137  K 3.8  --  4.8  CL 111  --   112*  CO2 21*  --  18*  GLUCOSE 115*  --  99  BUN 13  --  13  CREATININE 1.28* 1.24* 1.27*  CALCIUM 9.1  --  8.4*  GFRNONAA 49* 51* 50*  GFRAA 57* 59* 58*  ANIONGAP 7  --  7     Hematology Recent Labs  Lab 07/27/18 2005 07/28/18 0203 07/28/18 0558  WBC 9.8 8.1 7.1  RBC 4.03 3.50* 3.44*  HGB 10.8* 9.7* 9.2*  HCT 34.1* 29.9* 29.2*  MCV 84.6 85.4 84.9  MCH 26.8 27.7 26.7  MCHC 31.7 32.4 31.5  RDW 17.1* 17.2* 17.2*  PLT 427* 353 334    Cardiac Enzymes Recent Labs  Lab 07/28/18 0203 07/28/18 0558 07/28/18 1126  TROPONINI <0.03 <0.03 <0.03    Recent Labs  Lab 07/27/18 2013  TROPIPOC 0.00     BNPNo results for input(s): BNP, PROBNP in the last 168 hours.   DDimer No results for input(s): DDIMER in the last 168 hours.   Radiology  Dg Chest 2 View  Result Date: 07/27/2018 CLINICAL DATA:  Chest pain, history of myocardial infarction 1 year ago. Difficulty controlling blood pressure. EXAM: CHEST - 2 VIEW COMPARISON:  09/26/2017 FINDINGS: The heart size and mediastinal contours are within normal limits. Both lungs are clear. Right breast and axillary clips are redemonstrated. The visualized skeletal structures are unremarkable. IMPRESSION: No active cardiopulmonary disease. Electronically Signed   By: Ashley Royalty M.D.   On: 07/27/2018 20:15   Ct Head Wo Contrast  Result Date: 07/28/2018 CLINICAL DATA:  Headache.  History of hypertension. EXAM: CT HEAD WITHOUT CONTRAST TECHNIQUE: Contiguous axial images were obtained from the base of the skull through the vertex without intravenous contrast. COMPARISON:  None. FINDINGS: BRAIN: The ventricles and sulci are normal. No intraparenchymal hemorrhage, mass effect nor midline shift. No acute large vascular territory infarcts. Idiopathic basal ganglial calcifications are noted bilaterally. Grey-white matter distinction is maintained. No abnormal extra-axial fluid collections. Basal cisterns are not effaced and midline. The  brainstem and cerebellar hemispheres are without acute abnormalities. VASCULAR: Mild atherosclerosis of the carotid siphons. SKULL/SOFT TISSUES: No skull fracture. No significant soft tissue swelling. ORBITS/SINUSES: The included ocular globes and orbital contents are normal.The mastoid air cells are clear. The included paranasal sinuses are well-aerated. OTHER: None. IMPRESSION: No acute intracranial abnormality.  Atherosclerotic carotid siphons. Electronically Signed   By: Ashley Royalty M.D.   On: 07/28/2018 01:35    Cardiac Studies    Cath 12/2016 1. Acute total occlusion of the RCA, treated successful with PCI using a 2.75x38 mm Promus DES, deployed in overlapping fashion with the old stent which was widely patent 2. Mild nonobstructive LAD/LCx stenosis 3. Mild segmental LV systolic dysfunction with severe hypokinesis of the basal and mid inferior walls, but vigorous contractility of the anterior and apical walls, LVEF estimated 50-55%  TTE 09/27/17 - Left ventricle: The cavity size was normal. Systolic function was normal. The estimated ejection fraction was in the range of 60% to 65%. Wall motion was normal; there were no regional wall motion abnormalities. Left ventricular diastolic function parameters were normal. - Aortic valve: Valve area (VTI): 2.3 cm^2. Valve area (Vmax): 2.35 cm^2. Valve area (Vmean): 2.32 cm^2. - Atrial septum: No defect or patent foramen ovale was identified.  Patient Profile     47 y.o. female admitted with hypertensive urgency, headache and atypical chest pain. History of PCI/Stent RCA in March 2018, HTN, HLD, chronic tobacco use, CKD.   Assessment & Plan    Hypertension -continued on home amlodipine, hydralazine, lisinopril. Transitioned from metoprolol to carvedilol for better BP control. -BP much better controlled.  -Pt feels much better. Likely ready for discharge if OK with Dr. Marlou Porch.   CAD -Chest pain likely related to uncontrolled  hypertension now resolved. -Ruled out for MI by enzymes. No EKG changes -Continued on high intensity statin  Headache -Much improved, normal head CT      For questions or updates, please contact [place] Please consult [email or web address] for contact info under      Signed, Daune Perch, NP  07/29/2018, 9:37 AM    Personally seen and examined. Agree with above. 47 year old with overall reassuring cardiac work-up.  Does have coronary artery disease however negative cardiac biomarkers, troponin, no ECG changes.  Continue with aggressive secondary prevention with high intensity statin.  In regards to her hypertensive urgency, she is only complaining of mild headache but nothing like it was previously.  Head CT was negative for bleed.  On exam she  is nonfocal regular rate and rhythm without any murmurs rubs or gallops no JVD no significant edema lungs are clear and abdomen is soft.  Echo was being performed-and preliminarily appears to have EF of 70%, excellent images.  Overall reassuring.  Agree that she is ready for discharge.  Her blood pressure is under excellent control.  Yesterday in fact it was actually a bit low.  Continue with current blood pressure regimen and have her follow-up with APP, Dr. Debara Pickett team as well as primary physician.  Candee Furbish, MD

## 2018-07-29 NOTE — Progress Notes (Signed)
  Echocardiogram 2D Echocardiogram has been performed.  Darlina Sicilian M 07/29/2018, 1:37 PM

## 2018-07-29 NOTE — Discharge Summary (Addendum)
Discharge Summary    Patient ID: Anna Henson MRN: [id number]; DOB: [birth year]  Admit date: 07/27/2018 Discharge date: 07/29/2018  Primary Care Provider: Patient, No Pcp Per  Primary Cardiologist: Pixie Casino, MD  Primary Electrophysiologist:  None   Discharge Diagnoses    Active Problems:   Chest pain   Hypertensive urgency   Allergies Allergies  Allergen Reactions  . Pork-Derived Products Other (See Comments)    Does NOT eat pork    Diagnostic Studies/Procedures    Echo 07/29/2018: results pending. Preliminary read by Dr. Marlou Porch - EF 70%.  _____________   History of Present Illness     [name] is a 47 year old woman with CAD s/p PCI fo the RCA in March 2018, HTN, HL, chronic tobacco abuse, CKD who presents with severe headache and chest pain.   The patient reported that she has had ongoing difficulties with blood pressure control recently. She was last seen in cardiology clinic on 10/1 at which point hydralazine was started for blood pressure control. She also noted episodes of chest pain at that time and plan was for ongoing medical management at that time. The patient reports that her blood pressure initially improved with treatment with hydralazine, however has recently trended back up to the 381W systolic. For the past week she has had a severe migraine headache which significantly limits her ability to function. Reports she stayed in bed all day today. Due to this, she paged me this evening to discuss her symptoms. Given severe headache in setting of elevated BPs, advised the patient to present to the ED.  Patient reports that after we spoke, she initially tried to take a nap to see if headache would improve. Unfortunately, she then developed recurrent, severe substernal chest pain and thus she presented to the ED tonight. Pain is described as central stabbing. Similar to pain prior to PCI in March. No shortness of breath, orthopnea, PND, LEE, palpitations,  syncope. In the ED, she was started on a nitro drip for treatment of chest pain and hypertension. She reports significant improvement in chest pain at this time, with just a "twinge" remaining.   Hospital Course     Consultants: none  Patient ruled out for MI by enzymes and there were no EKG changes.  She had a normal head CT.  She was continued on her home amlodipine, hydralazine, lisinopril.  She was transitioned from metoprolol to carvedilol for better blood pressure control.  Her blood pressure was low on IV nitroglycerin but is improved now.  Her headache is much better.  It was felt that her chest pain was related to uncontrolled hypertension. Echo looks excellent with preliminary read by Dr. Marlou Porch with EF 70%.  Patient has been seen by Dr. Marlou Porch today and deemed ready for discharge home. All follow up appointments have been scheduled. Discharge medications are listed below.  _____________  Discharge Vitals Blood pressure (!) 146/74, pulse 70, temperature 99 F (37.2 C), temperature source Oral, resp. rate 14, height 5\' 2"  (1.575 m), weight 60.1 kg, SpO2 98 %.  Filed Weights   07/28/18 0152 07/29/18 0451 07/29/18 0626  Weight: 70.4 kg 69.8 kg 60.1 kg    Labs & Radiologic Studies    CBC Recent Labs    07/28/18 0203 07/28/18 0558  WBC 8.1 7.1  HGB 9.7* 9.2*  HCT 29.9* 29.2*  MCV 85.4 84.9  PLT 353 299   Basic Metabolic Panel Recent Labs    07/27/18 2005 07/28/18 0203 07/28/18 3716  NA 139  --  137  K 3.8  --  4.8  CL 111  --  112*  CO2 21*  --  18*  GLUCOSE 115*  --  99  BUN 13  --  13  CREATININE 1.28* 1.24* 1.27*  CALCIUM 9.1  --  8.4*   Liver Function Tests No results for input(s): AST, ALT, ALKPHOS, BILITOT, PROT, ALBUMIN in the last 72 hours. No results for input(s): LIPASE, AMYLASE in the last 72 hours. Cardiac Enzymes Recent Labs    07/28/18 0203 07/28/18 0558 07/28/18 1126  TROPONINI <0.03 <0.03 <0.03   BNP Invalid input(s):  POCBNP D-Dimer No results for input(s): DDIMER in the last 72 hours. Hemoglobin A1C No results for input(s): HGBA1C in the last 72 hours. Fasting Lipid Panel No results for input(s): CHOL, HDL, LDLCALC, TRIG, CHOLHDL, LDLDIRECT in the last 72 hours. Thyroid Function Tests No results for input(s): TSH, T4TOTAL, T3FREE, THYROIDAB in the last 72 hours.  Invalid input(s): FREET3 _____________  Dg Chest 2 View  Result Date: 07/27/2018 CLINICAL DATA:  Chest pain, history of myocardial infarction 1 year ago. Difficulty controlling blood pressure. EXAM: CHEST - 2 VIEW COMPARISON:  09/26/2017 FINDINGS: The heart size and mediastinal contours are within normal limits. Both lungs are clear. Right breast and axillary clips are redemonstrated. The visualized skeletal structures are unremarkable. IMPRESSION: No active cardiopulmonary disease. Electronically Signed   By: Ashley Royalty M.D.   On: 07/27/2018 20:15   Ct Head Wo Contrast  Result Date: 07/28/2018 CLINICAL DATA:  Headache.  History of hypertension. EXAM: CT HEAD WITHOUT CONTRAST TECHNIQUE: Contiguous axial images were obtained from the base of the skull through the vertex without intravenous contrast. COMPARISON:  None. FINDINGS: BRAIN: The ventricles and sulci are normal. No intraparenchymal hemorrhage, mass effect nor midline shift. No acute large vascular territory infarcts. Idiopathic basal ganglial calcifications are noted bilaterally. Grey-white matter distinction is maintained. No abnormal extra-axial fluid collections. Basal cisterns are not effaced and midline. The brainstem and cerebellar hemispheres are without acute abnormalities. VASCULAR: Mild atherosclerosis of the carotid siphons. SKULL/SOFT TISSUES: No skull fracture. No significant soft tissue swelling. ORBITS/SINUSES: The included ocular globes and orbital contents are normal.The mastoid air cells are clear. The included paranasal sinuses are well-aerated. OTHER: None. IMPRESSION: No  acute intracranial abnormality.  Atherosclerotic carotid siphons. Electronically Signed   By: Ashley Royalty M.D.   On: 07/28/2018 01:35   Disposition   Pt is being discharged home today in good condition.  Follow-up Plans & Appointments    Follow-up Information    Barrett, Evelene Croon, PA-C Follow up.   Specialties:  Cardiology, Radiology Why:  Cardiology hospital follow-up on 08/19/2018 at 8:00 AM.  Please arrive 15 minutes early for check-in.  Contact information: [address] [place] [address] [phone]          Discharge Instructions    Diet - low sodium heart healthy   Complete by:  As directed    Increase activity slowly   Complete by:  As directed      Discharge Medications   Allergies as of 07/29/2018      Reactions   Pork-derived Products Other (See Comments)   Does NOT eat pork      Medication List    STOP taking these medications   metoprolol tartrate 50 MG tablet Commonly known as:  LOPRESSOR     TAKE these medications   acetaminophen 325 MG tablet Commonly known as:  TYLENOL Take  325-650 mg by mouth every 8 (eight) hours as needed (for pain or cramping).   amLODipine 5 MG tablet Commonly known as:  NORVASC TAKE 1 TABLET(5 MG) BY MOUTH DAILY What changed:  See the new instructions.   ARIPiprazole 10 MG tablet Commonly known as:  ABILIFY Take 1 tablet (10 mg total) by mouth daily. For mood control What changed:  additional instructions   aspirin 81 MG chewable tablet Chew 1 tablet (81 mg total) by mouth daily.   atorvastatin 80 MG tablet Commonly known as:  LIPITOR TAKE 1 TABLET(80 MG) BY MOUTH DAILY What changed:  See the new instructions.   baclofen 10 MG tablet Commonly known as:  LIORESAL Take up to 4/day for muscle spasms What changed:    how much to take  how to take this  when to take this  additional instructions   carvedilol 12.5 MG tablet Commonly known as:  COREG Take 1 tablet (12.5 mg total) by  mouth 2 (two) times daily with a meal.   clonazePAM 1 MG tablet Commonly known as:  KLONOPIN Take 0.5 mg by mouth every evening.   folic acid 1 MG tablet Commonly known as:  FOLVITE Take 1 mg by mouth daily.   furosemide 20 MG tablet Commonly known as:  LASIX TAKE 1 TABLET(20 MG) BY MOUTH DAILY AS NEEDED What changed:  See the new instructions.   gabapentin 300 MG capsule Commonly known as:  NEURONTIN Take 1 capsule (300 mg total) by mouth 2 (two) times daily.   hydrALAZINE 25 MG tablet Commonly known as:  APRESOLINE Take 1 tablet (25 mg total) by mouth 3 (three) times daily.   hydrOXYzine 25 MG tablet Commonly known as:  ATARAX/VISTARIL Take 1 tablet (25 mg total) by mouth every 6 (six) hours as needed for anxiety.   lisinopril 10 MG tablet Commonly known as:  PRINIVIL,ZESTRIL Take 10 mg by mouth 2 (two) times daily.   nitroGLYCERIN 0.4 MG SL tablet Commonly known as:  NITROSTAT Place 0.4 mg under the tongue every 5 (five) minutes as needed for chest pain.   pantoprazole 40 MG tablet Commonly known as:  PROTONIX Take 1 tablet (40 mg total) by mouth daily.   sertraline 50 MG tablet Commonly known as:  ZOLOFT Take 1 tablet (50 mg total) by mouth daily. For mood control What changed:  additional instructions   tamoxifen 20 MG tablet Commonly known as:  NOLVADEX TAKE 1 TABLET(20 MG) BY MOUTH DAILY What changed:  See the new instructions.   traZODone 50 MG tablet Commonly known as:  DESYREL Take 1 tablet (50 mg total) by mouth at bedtime as needed for sleep.        Acute coronary syndrome (MI, NSTEMI, STEMI, etc) this admission?: No.    Outstanding Labs/Studies   None  Duration of Discharge Encounter   Greater than 30 minutes including physician time.  Signed, Daune Perch, NP 07/29/2018, 1:55 PM  Personally seen and examined. Agree with above. 47 year old with overall reassuring cardiac work-up.  Does have coronary artery disease however negative  cardiac biomarkers, troponin, no ECG changes.  Continue with aggressive secondary prevention with high intensity statin.  In regards to her hypertensive urgency, she is only complaining of mild headache but nothing like it was previously.  Head CT was negative for bleed.  On exam she is nonfocal regular rate and rhythm without any murmurs rubs or gallops no JVD no significant edema lungs are clear and abdomen is soft.  Echo was  being performed-and preliminarily appears to have EF of 70%, moderate LVH, excellent images.  Overall reassuring.  Agree that she is ready for discharge.  Her blood pressure is under excellent control.  Yesterday in fact it was actually a bit low.  Continue with current blood pressure regimen and have her follow-up with APP, Dr. Debara Pickett team as well as primary physician.  Candee Furbish, MD

## 2018-08-02 ENCOUNTER — Ambulatory Visit: Payer: Self-pay | Admitting: Physician Assistant

## 2018-08-13 ENCOUNTER — Telehealth: Payer: Self-pay | Admitting: Physician Assistant

## 2018-08-13 ENCOUNTER — Other Ambulatory Visit: Payer: Self-pay | Admitting: Internal Medicine

## 2018-08-13 ENCOUNTER — Other Ambulatory Visit: Payer: Self-pay | Admitting: Student

## 2018-08-13 DIAGNOSIS — R079 Chest pain, unspecified: Secondary | ICD-10-CM

## 2018-08-13 MED ORDER — LISINOPRIL 20 MG PO TABS: 20.0000 mg | ORAL_TABLET | Freq: Two times a day (BID) | ORAL | 6 refills | Status: DC

## 2018-08-13 MED ORDER — AMLODIPINE BESYLATE 10 MG PO TABS: 10.0000 mg | ORAL_TABLET | Freq: Every day | ORAL | 6 refills | Status: DC

## 2018-08-13 NOTE — Telephone Encounter (Signed)
Patient called after hours for lisinopril refills.  He currently takes amlodipine 5 mg daily, Coreg 12.5 mg twice daily, hydralazine 25 mg 3 times daily and lisinopril 20 mg twice daily (however discharge medication listed 10 mg twice daily).  During my evaluation blood pressure was 152/90.  Serum creatinine 1.06 on 10/24.  Given elevated blood pressure will increase amlodipine to 10 mg daily.  Otherwise continue current medication at Coreg 12.5 mg twice daily, hydralazine 25 mg 3 times daily and lisinopril 20 mg twice daily.

## 2018-08-13 NOTE — Telephone Encounter (Signed)
Rx request sent to pharmacy.  

## 2018-08-19 ENCOUNTER — Ambulatory Visit (INDEPENDENT_AMBULATORY_CARE_PROVIDER_SITE_OTHER): Payer: PRIVATE HEALTH INSURANCE | Admitting: Physician Assistant

## 2018-08-19 ENCOUNTER — Encounter: Payer: Self-pay | Admitting: Physician Assistant

## 2018-08-19 ENCOUNTER — Ambulatory Visit: Payer: Self-pay | Admitting: Physician Assistant

## 2018-08-19 VITALS — BP 128/80 | HR 67 | Ht 61.5 in | Wt 164.2 lb

## 2018-08-19 DIAGNOSIS — R079 Chest pain, unspecified: Secondary | ICD-10-CM

## 2018-08-19 DIAGNOSIS — I2510 Atherosclerotic heart disease of native coronary artery without angina pectoris: Secondary | ICD-10-CM | POA: Diagnosis not present

## 2018-08-19 DIAGNOSIS — I10 Essential (primary) hypertension: Secondary | ICD-10-CM

## 2018-08-19 NOTE — Patient Instructions (Signed)
Medication Instructions:  Your physician recommends that you continue on your current medications as directed. Please refer to the Current Medication list given to you today.  If you need a refill on your cardiac medications before your next appointment, please call your pharmacy.   Lab work: None If you have labs (blood work) drawn today and your tests are completely normal, you will receive your results only by: Marland Kitchen MyChart Message (if you have MyChart) OR . A paper copy in the mail If you have any lab test that is abnormal or we need to change your treatment, we will call you to review the results.  Testing/Procedures: None  Follow-Up: At [facility], you and your health needs are our priority.  As part of our continuing mission to provide you with exceptional heart care, we have created designated Provider Care Teams.  These Care Teams include your primary Cardiologist (physician) and Advanced Practice Providers (APPs -  Physician Assistants and Nurse Practitioners) who all work together to provide you with the care you need, when you need it. You will need a follow up appointment in 6 months.  Please call our office 2 months in advance to schedule this appointment.  You may see Pixie Casino, MD or one of the following Advanced Practice Providers on your designated Care Team: [place], [place] . Fabian Sharp, PA-C  Any Other Special Instructions Will Be Listed Below (If Applicable).

## 2018-08-19 NOTE — Progress Notes (Signed)
Cardiology Office Note   Date:  08/19/2018   ID:  Anna Henson, DOB 1970/12/02, MRN [id number]  PCP:  Patient, No Pcp Per Cardiologist:  Pixie Casino, MD 12/21/2017 [name], PA-C 07/09/2018  No chief complaint on file.   History of Present Illness: Anna Henson is a 47 y.o. female with a history of HLD, HTN, CKD III, remote tob use, DES mRCA 2015, DES pRCA 12/31/2016 w/ mild LAD/CFX dz, EF 50-55%  10/1 office visit, blood pressure had been elevated.  She had been having chest pain in the setting of uncontrolled hypertension, did not wish to do a Lexiscan and not ambulatory enough for a treadmill.  Started on hydralazine 25 mg 3 times daily, lipids checked and LDL was at 87, Zetia added.  Potassium and magnesium were normal, and did not explain leg cramping Admitted 10/19- 07/29/2018 for headache and chest pain.  EF normal by echo, enzymes negative for MI, metoprolol transitioned to carvedilol and chest pain was felt secondary to uncontrolled hypertension Phone note 11/5 regarding hypertension, amlodipine increased from 5 mg to 10 mg daily, continue hydralazine 25 3 times daily, Coreg 12.5 twice daily and lisinopril 20 mg twice daily  Anna Henson presents for cardiology follow up.   Since the phone note of 11/5, she has been taking 10 mg of amlodipine daily.   Since doing that, she has not had any headaches. Blessing!!  She had 1 episode of CP after d/c 10/21. She took 1 nitro and rested. The CP went away and did not come back. She does not know what her BP was running at that time, but generally was 150s/90s.   BP since on the higher amlodipine dose has been 120s-150s/80-90s. SBP mostly < 150 prior to taking am meds, and controlled after that.   She has been walking around with her job.  She has not had chest pain or shortness of breath with ambulation.  She had transverse myelitis in 2017 and has problems w/ leg cramping and pain after she walks a block or 2 ever  since then. Has to stop and rest.  Otherwise, the cramping will get worse.   Past Medical History:  Diagnosis Date  . Anxiety   . Bipolar disorder ([place])   . Breast cancer, right breast ([place]) ~ 03/2017  . CAD in native artery    a. 09/2014 Cath/PCI in setting of [place] - s/p 2.25 x 12 mm Promus Premier DES to the RCA;  b. 11/2016 Myoview: EF 56%, no ischemia;  c. 12/2016 NSTEMI/PCI: LM nl, LAd 25p, LCX 30p, RCA 100p (2.75x38 Promus Premier DES), mRCA 10 ISR, EF 50-55%.  . Chronic kidney disease    she sthinks has stage 2 CKD, has had US doppler and has some stenosis , this was done in [place]   . CKD (chronic kidney disease), stage II   . Depression   . Dysrhythmia    Tachycardia in evenings  . Genetic testing 04/23/2017   Ms. Schoeppner underwent genetic counseling and testing for hereditary cancer syndromes on 04/05/2017. Her results were negative for mutations in all 46 genes analyzed by [facility]'s 46-gene Common Hereditary Cancers Panel. Genes analyzed include: APC, ATM, AXIN2, BARD1, BMPR1A, BRCA1, BRCA2, BRIP1, CDH1, CDKN2A, CHEK2, CTNNA1, DICER1, EPCAM, GREM1, HOXB13, KIT, MEN1, MLH1, MSH2, MSH3, MSH6, MUTYH, NBN,  . GERD (gastroesophageal reflux disease)   . Headache    "bi-weekly" (05/07/2017)  . Heart attack ([place]) 12/31/2016  . HOCM (hypertrophic obstructive cardiomyopathy) ([place])  a. 12/2016 Echo: EF 65-70%,  mod conc LVH, dynamic obstruction @ rest, peak velocity of 291 cm/sec w/ peak gradient of 71mHg, no rwma, Gr1 DD, triv TR, PASP 164mg.  . Marland Kitchenyperlipidemia   . Hypertension    > 5 years  . Migraine    "probably 1/month" (05/07/2017)  . Palpitations   . Pneumonia    "twice when I was a child" (05/07/2017)  . Quit using tobacco in remote past    quit in 2016  . Transverse myelitis [place]    Past Surgical History:  Procedure Laterality Date  . ABDOMINAL HERNIA REPAIR  ~ 2001; ~ 2005  . BREAST BIOPSY Right ~ 03/2017 X 2  . BREAST LUMPECTOMY WITH RADIOACTIVE SEED AND SENTINEL  LYMPH NODE BIOPSY Right 06/21/2017   Procedure: RIGHT BREAST BRACKETED SEED GUIDED LUMPECTOMY AND SENTINEL LYMPH NODE BIOPSY;  Surgeon: ToJovita KussmaulMD;  [facility] Service: General;  Laterality: Right;  . CORONARY ANGIOPLASTY WITH STENT PLACEMENT     L main OK, LAD OK, CFX 30%, RCA 90%-0% w/ 2.25 x 12 mm Promus Premier DES, EF 55%  . CORONARY/GRAFT ACUTE MI REVASCULARIZATION N/A 12/31/2016   Procedure: Coronary/Graft Acute MI Revascularization;  Surgeon: MiSherren MochaMD;  [facility];  Service: Cardiovascular;  Laterality: N/A;  . HERNIA REPAIR     times 2  umbilical  . LEFT HEART CATH AND CORONARY ANGIOGRAPHY N/A 12/31/2016   Procedure: Left Heart Cath and Coronary Angiography;  Surgeon: MiSherren MochaMD;  [facility];  Service: Cardiovascular;  Laterality: N/A;  . LEFT HEART CATHETERIZATION WITH CORONARY ANGIOGRAM N/A 09/14/2014   Procedure: LEFT HEART CATHETERIZATION WITH CORONARY ANGIOGRAM;  Surgeon: ChBurnell BlanksMD;  [facility];  Service: Cardiovascular;  Laterality: N/A;  . PERCUTANEOUS CORONARY STENT INTERVENTION (PCI-S)  09/14/2014   Procedure: PERCUTANEOUS CORONARY STENT INTERVENTION (PCI-S);  Surgeon: ChBurnell BlanksMD;  [facility];  Service: Cardiovascular;;  . TONSILLECTOMY  2005    Current Outpatient Medications  Medication Sig Dispense Refill  . acetaminophen (TYLENOL) 325 MG tablet Take 325-650 mg by mouth every 8 (eight) hours as needed (for pain or cramping).     . Marland KitchenmLODipine (NORVASC) 10 MG tablet Take 1 tablet (10 mg total) by mouth daily. 30 tablet 6  . ARIPiprazole (ABILIFY) 10 MG tablet Take 1 tablet (10 mg total) by mouth daily. For mood control (Patient taking differently: Take 10 mg by mouth daily. ) 30 tablet 0  . aspirin 81 MG chewable tablet Chew 1 tablet (81 mg total) by mouth daily.    . Marland Kitchentorvastatin (LIPITOR) 80 MG tablet TAKE 1 TABLET(80 MG) BY MOUTH DAILY (Patient taking  differently: Take by mouth daily at 6 PM. ) 90 tablet 3  . baclofen (LIORESAL) 10 MG tablet Take up to 4/day for muscle spasms (Patient taking differently: Take 10-20 mg by mouth See admin instructions. 10 mg in the morning then 10 mg in the afternoon then 20 mg at bedtime) 120 each 11  . carvedilol (COREG) 12.5 MG tablet Take 1 tablet (12.5 mg total) by mouth 2 (two) times daily with a meal. 180 tablet 3  . clonazePAM (KLONOPIN) 1 MG tablet Take 0.5 mg by mouth every evening.  5  . folic acid (FOLVITE) 1 MG tablet Take 1 mg by mouth daily.    . furosemide (LASIX) 20 MG tablet TAKE 1 TABLET(20 MG) BY MOUTH DAILY AS NEEDED (Patient taking differently:  Take 20 mg by mouth daily as needed for fluid or edema. ) 10 tablet 0  . gabapentin (NEURONTIN) 300 MG capsule Take 1 capsule (300 mg total) by mouth 2 (two) times daily. 60 capsule 0  . hydrALAZINE (APRESOLINE) 25 MG tablet Take 1 tablet (25 mg total) by mouth 3 (three) times daily. 270 tablet 1  . hydrOXYzine (ATARAX/VISTARIL) 25 MG tablet Take 1 tablet (25 mg total) by mouth every 6 (six) hours as needed for anxiety. 30 tablet 0  . lisinopril (PRINIVIL,ZESTRIL) 20 MG tablet Take 1 tablet (20 mg total) by mouth 2 (two) times daily. 60 tablet 6  . nitroGLYCERIN (NITROSTAT) 0.4 MG SL tablet Place 0.4 mg under the tongue every 5 (five) minutes as needed for chest pain.    . nitroGLYCERIN (NITROSTAT) 0.4 MG SL tablet PLACE 1 TABLET UNDER THE TONGUE EVERY 5 MINUTES AS NEEDED FOR CHEST PAIN 25 tablet 4  . pantoprazole (PROTONIX) 40 MG tablet Take 1 tablet (40 mg total) by mouth daily. 30 tablet 0  . sertraline (ZOLOFT) 50 MG tablet Take 1 tablet (50 mg total) by mouth daily. For mood control (Patient taking differently: Take 50 mg by mouth daily. ) 30 tablet 0  . tamoxifen (NOLVADEX) 20 MG tablet TAKE 1 TABLET(20 MG) BY MOUTH DAILY (Patient taking differently: Take 20 mg by mouth daily. ) 90 tablet 2  . traZODone (DESYREL) 50 MG tablet Take 1 tablet (50 mg  total) by mouth at bedtime as needed for sleep. 30 tablet 0   No current facility-administered medications for this visit.    Facility-Administered Medications Ordered in Other Visits  Medication Dose Route Frequency Provider Last Rate Last Dose  . gadopentetate dimeglumine (MAGNEVIST) injection 12 mL  12 mL Intravenous Once PRN Sater, Richard A, MD      . gadopentetate dimeglumine (MAGNEVIST) injection 16 mL  16 mL Intravenous Once PRN Sater, Nanine Means, MD        Allergies:   Pork-derived products    Social History:  The patient  reports that she quit smoking about 2 years ago. Her smoking use included cigarettes. She has a 15.00 pack-year smoking history. She has never used smokeless tobacco. She reports that she drinks about 2.0 standard drinks of alcohol per week. She reports that she does not use drugs.   Family History:  The patient's family history includes Bone cancer (age of onset: 21) in her father; Brain cancer (age of onset: 25) in her father; Diabetes in her mother; Drug abuse in her father and sister; Heart disease in her mother; Hyperlipidemia in her mother; Hypertension in her mother; Lung cancer in her father; Mental illness in her brother and maternal grandmother; Stomach cancer (age of onset: 45) in her paternal grandmother.  She indicated that her mother is alive. She indicated that her father is alive. She indicated that the status of her sister is unknown. She indicated that the status of her brother is unknown. She indicated that her maternal grandmother is deceased. She indicated that her maternal grandfather is deceased. She indicated that her paternal grandmother is deceased. She indicated that her paternal grandfather is deceased. She indicated that her maternal aunt is deceased. She indicated that her paternal uncle is deceased.    ROS:  Please see the history of present illness. All other systems are reviewed and negative.    PHYSICAL EXAM: VS:  BP 128/80   Pulse  67   Ht 5' 1.5" (1.562 m)   Wt 164  lb 3.2 oz (74.5 kg)   LMP 07/23/2018   BMI 30.52 kg/m  , BMI Body mass index is 30.52 kg/m. GEN: Well nourished, well developed, female in no acute distress HEENT: normal for age  Neck: no JVD, no carotid bruit, no masses Cardiac: RRR; soft murmur, no rubs, or gallops Respiratory:  clear to auscultation bilaterally, normal work of breathing GI: soft, nontender, nondistended, + BS MS: no deformity or atrophy; no edema; distal pulses are 2+ in all 4 extremities  Skin: warm and dry, no rash Neuro:  Strength and sensation are intact Psych: euthymic mood, full affect   EKG:  EKG is ordered today. The ekg ordered today demonstrates sinus rhythm, heart rate 67, no acute ischemic changes and normal intervals  ECHO: 07/29/2018 - Left ventricle: The cavity size was normal. Wall thickness was   increased in a pattern of moderate LVH. Systolic function was   vigorous. The estimated ejection fraction was in the range of 65%   to 70%. Grossly normal longitudinal LV strain at -18%. Wall   motion was normal; there were no regional wall motion   abnormalities. Doppler parameters are consistent with abnormal   left ventricular relaxation (grade 1 diastolic dysfunction). The   E/e&' ratio is between 8-15, suggesting indeterminate LV filling   pressure. - Aortic valve: Sclerosis without stenosis. There was no   regurgitation. - Mitral valve: Mildly thickened leaflets . There was trivial   regurgitation. - Left atrium: The atrium was mildly dilated. - Inferior vena cava: The vessel was normal in size. The   respirophasic diameter changes were in the normal range (>= 50%),   consistent with normal central venous pressure.  Impressions:  - Compared to a prior study in 09/2017, there is now moderate LVH   with an increased LVEF of 65-70%, grade 1 DD and indeterminate LV   filling pressure.  CATH: 12/31/2016 1. Acute total occlusion of the RCA, treated  successful with PCI using a 2.75x38 mm Promus DES, deployed in overlapping fashion with the old stent which was widely patent 2. Mild nonobstructive LAD/LCx stenosis 3. Mild segmental LV systolic dysfunction with severe hypokinesis of the basal and mid inferior walls, but vigorous contractility of the anterior and apical walls, LVEF estimated 50-55%  Recommend:  DAPT with ASA and brilinta at least 12 months without interruption    Recent Labs: 09/26/2017: B Natriuretic Peptide 37.2 07/09/2018: ALT 16; Magnesium 2.2 07/28/2018: BUN 13; Creatinine, Ser 1.27; Hemoglobin 9.2; Platelets 334; Potassium 4.8; Sodium 137  CBC    Component Value Date/Time   WBC 7.1 07/28/2018 0558   RBC 3.44 (L) 07/28/2018 0558   HGB 9.2 (L) 07/28/2018 0558   HGB 11.1 (L) 04/01/2018 1015   HGB 11.6 04/04/2017 0835   HCT 29.2 (L) 07/28/2018 0558   HCT 33.5 (L) 04/04/2017 0835   PLT 334 07/28/2018 0558   PLT 319 04/01/2018 1015   PLT 352 04/04/2017 0835   MCV 84.9 07/28/2018 0558   MCV 90.8 04/04/2017 0835   MCH 26.7 07/28/2018 0558   MCHC 31.5 07/28/2018 0558   RDW 17.2 (H) 07/28/2018 0558   RDW 14.8 (H) 04/04/2017 0835   LYMPHSABS 2.1 04/01/2018 1015   LYMPHSABS 2.1 04/04/2017 0835   MONOABS 0.8 04/01/2018 1015   MONOABS 0.6 04/04/2017 0835   EOSABS 0.3 04/01/2018 1015   EOSABS 0.2 04/04/2017 0835   BASOSABS 0.1 04/01/2018 1015   BASOSABS 0.1 04/04/2017 0835   CMP Latest Ref Rng & Units 07/28/2018 07/28/2018  07/27/2018  Glucose 70 - 99 mg/dL 99 - 115(H)  BUN 6 - 20 mg/dL 13 - 13  Creatinine 0.44 - 1.00 mg/dL 1.27(H) 1.24(H) 1.28(H)  Sodium 135 - 145 mmol/L 137 - 139  Potassium 3.5 - 5.1 mmol/L 4.8 - 3.8  Chloride 98 - 111 mmol/L 112(H) - 111  CO2 22 - 32 mmol/L 18(L) - 21(L)  Calcium 8.9 - 10.3 mg/dL 8.4(L) - 9.1  Total Protein 6.0 - 8.5 g/dL - - -  Total Bilirubin 0.0 - 1.2 mg/dL - - -  Alkaline Phos 39 - 117 IU/L - - -  AST 0 - 40 IU/L - - -  ALT 0 - 32 IU/L - - -     Lipid Panel     Component Value Date/Time   CHOL 149 07/09/2018 1509   TRIG 99 07/09/2018 1509   HDL 42 07/09/2018 1509   CHOLHDL 3.5 07/09/2018 1509   CHOLHDL 3.9 12/31/2016 1036   VLDL 20 12/31/2016 1036   LDLCALC 87 07/09/2018 1509     Wt Readings from Last 3 Encounters:  08/19/18 164 lb 3.2 oz (74.5 kg)  07/29/18 132 lb 8 oz (60.1 kg)  07/09/18 158 lb 9.6 oz (71.9 kg)     Other studies Reviewed: Additional studies/ records that were reviewed today include: Office notes, hospital records and testing.  ASSESSMENT AND PLAN:  1.  Chest pain: She has not had any episodes since her blood pressure has been better controlled.  Previous testing was normal. - I discussed the situation with Ms. Riviere, she prefers to hold off on further testing at this time. -If she gets additional episodes of chest pain when her blood pressure is controlled, she will contact us  2.  Hypertension: - Blood pressure control is much improved. -She is not currently having any side effects from the medications. - Her renal function has remained stable on lisinopril 20 mg twice daily. -Continue her current medication profile. -Her diastolic blood pressure tends to run high, the hydralazine may need to be increased at some point.  For now, hold off.  3.  CAD: -Her last PCI was in 12/2016, DES to the RCA.  She completed 12 months of dual antiplatelet therapy. - She is currently on aspirin alone, high-dose statin, Coreg and lisinopril, continue these meds  Current medicines are reviewed at length with the patient today.  The patient does not have concerns regarding medicines.  The following changes have been made:  no change  Labs/ tests ordered today include:  No orders of the defined types were placed in this encounter.   Disposition:   FU with Pixie Casino, MD in 1 year  Signed, [name], PA-C  08/19/2018 3:45 PM    [place] Phone: [phone]; Fax: [phone]  This  note was written with the assistance of speech recognition software.  Please excuse any transcriptional errors.

## 2018-08-26 ENCOUNTER — Telehealth: Payer: Self-pay | Admitting: Hematology and Oncology

## 2018-08-26 NOTE — Telephone Encounter (Signed)
VG PAL 12/24 moved f/u to 10/14/18. Spoke with patient - date per patient.

## 2018-09-03 ENCOUNTER — Other Ambulatory Visit: Payer: Self-pay | Admitting: Internal Medicine

## 2018-10-01 ENCOUNTER — Ambulatory Visit: Payer: Self-pay | Admitting: Hematology and Oncology

## 2018-10-10 ENCOUNTER — Other Ambulatory Visit: Payer: Self-pay | Admitting: Internal Medicine

## 2018-10-10 MED ORDER — FUROSEMIDE 20 MG PO TABS: ORAL_TABLET | ORAL | 0 refills | Status: DC

## 2018-10-10 NOTE — Telephone Encounter (Signed)
°*  STAT* If patient is at the pharmacy, call can be transferred to refill team.   1. Which medications need to be refilled? (please list name of each medication and dose if known) new prescription for Furosemide 20 mg  2. Which pharmacy/location (including street and city if local pharmacy) is medication to be sent to? [facility]- [phone]  3. Do they need a 30 day or 90 day supply?30 and refills

## 2018-10-14 ENCOUNTER — Inpatient Hospital Stay: Payer: 59 | Attending: Hematology and Oncology | Admitting: Hematology and Oncology

## 2018-10-14 ENCOUNTER — Telehealth: Payer: Self-pay | Admitting: Hematology and Oncology

## 2018-10-14 DIAGNOSIS — R21 Rash and other nonspecific skin eruption: Secondary | ICD-10-CM | POA: Diagnosis not present

## 2018-10-14 DIAGNOSIS — C50511 Malignant neoplasm of lower-outer quadrant of right female breast: Secondary | ICD-10-CM | POA: Insufficient documentation

## 2018-10-14 DIAGNOSIS — I890 Lymphedema, not elsewhere classified: Secondary | ICD-10-CM | POA: Diagnosis not present

## 2018-10-14 DIAGNOSIS — Z170 Estrogen receptor positive status [ER+]: Secondary | ICD-10-CM

## 2018-10-14 MED ORDER — TAMOXIFEN CITRATE 20 MG PO TABS: 20.0000 mg | ORAL_TABLET | Freq: Every day | ORAL | 3 refills | Status: DC

## 2018-10-14 NOTE — Telephone Encounter (Signed)
Gave avs and calendar ° °

## 2018-10-14 NOTE — Assessment & Plan Note (Signed)
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

## 2018-10-14 NOTE — Progress Notes (Signed)
Patient Care Team: Patient, No Pcp Per as PCP - General (General Practice) Hilty, Nadean Corwin, MD as PCP - Cardiology (Cardiology) Sater, Nanine Means, MD as Consulting Physician (Neurology) Jovita Kussmaul, MD as Consulting Physician (General Surgery) Nicholas Lose, MD as Consulting Physician (Hematology and Oncology) Kyung Rudd, MD as Consulting Physician (Radiation Oncology)  DIAGNOSIS:  Encounter Diagnosis  Name Primary?  . Malignant neoplasm of lower-outer quadrant of right breast of female, estrogen receptor positive ([place])     SUMMARY OF ONCOLOGIC HISTORY:   Malignant neoplasm of lower-outer quadrant of right breast of female, estrogen receptor positive ([place])   03/26/2017 Initial Diagnosis    Palpable right breast mass with a normal mammogram: 8:00 position by ultrasound 3.2 cm irregular mass: Grade 2 invasive lobular cancer ER 85%, PR 100%, Ki-67 2%, HER-2 negative ratio 1.5, T2 N0 stage II a clinical stage    04/20/2017 Genetic Testing    Genetic counseling and testing for hereditary cancer syndromes performed on . Results are negative for pathogenic mutations in 46 genes analyzed by [facility] Common Hereditary Cancers Panel. Results are dated . Genes tested: APC, ATM, AXIN2, BARD1, BMPR1A, BRCA1, BRCA2, BRIP1, CDH1, CDKN2A, CHEK2, CTNNA1, DICER1, EPCAM, GREM1, HOXB13, KIT, MEN1, MLH1, MSH2, MSH3, MSH6, MUTYH, NBN, NF1, NTHL1, PALB2, PDGFRA, PMS2, POLD1, POLE, PTEN, RAD50, RAD51C, RAD51D, SDHA, SDHB, SDHC, SDHD, SMAD4, SMARCA4, STK11, TP53, TSC1, TSC2, and VHL.  Variants of uncertain significance (not clinically actionable) were noted in APC and BRIP1.       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  Her biggest complaint is the itching and rash underneath the right breast for which she has been using multiple topical creams.  Appears to be helping.  She denies any lumps or nodules in the breast.  REVIEW OF SYSTEMS:   Constitutional: Denies fevers, chills or abnormal weight loss Eyes: Denies blurriness of vision Ears, nose, mouth, throat, and face: Denies mucositis or sore throat Respiratory: Denies cough, dyspnea or wheezes Cardiovascular: Denies palpitation, chest discomfort Gastrointestinal:  Denies nausea, heartburn or change in bowel habits Skin: Denies abnormal skin rashes Lymphatics: Denies new lymphadenopathy or easy bruising Neurological:Denies numbness, tingling or new weaknesses Behavioral/Psych: Mood is stable, no new changes  Extremities: No lower extremity edema Breast: Itching and rash underneath the right breast.  Denies any pain or lumps or nodules in either breasts All other systems were reviewed with the patient and are negative.  I have reviewed the past medical history, past surgical history, social history and family history with the patient and they are unchanged from previous note.  ALLERGIES:  is allergic to pork-derived products.  MEDICATIONS:  Current Outpatient Medications  Medication Sig Dispense Refill  . acetaminophen (TYLENOL) 325 MG tablet Take 325-650 mg by mouth every 8 (eight) hours as needed (for pain or cramping).     Marland Kitchen amLODipine (NORVASC)  10 MG tablet Take 1 tablet (10 mg total) by mouth daily. 30 tablet 6  . ARIPiprazole (ABILIFY) 10 MG tablet Take 1 tablet (10 mg total) by mouth daily. For mood control (Patient taking differently: Take 10 mg by  mouth daily. ) 30 tablet 0  . aspirin 81 MG chewable tablet Chew 1 tablet (81 mg total) by mouth daily.    Marland Kitchen atorvastatin (LIPITOR) 80 MG tablet TAKE 1 TABLET(80 MG) BY MOUTH DAILY (Patient taking differently: Take by mouth daily at 6 PM. ) 90 tablet 3  . baclofen (LIORESAL) 10 MG tablet Take up to 4/day for muscle spasms (Patient taking differently: Take 10-20 mg by mouth See admin instructions. 10 mg in the morning then 10 mg in the afternoon then 20 mg at bedtime) 120 each 11  . carvedilol (COREG) 12.5 MG tablet Take 1 tablet (12.5 mg total) by mouth 2 (two) times daily with a meal. 180 tablet 3  . clonazePAM (KLONOPIN) 1 MG tablet Take 0.5 mg by mouth every evening.  5  . folic acid (FOLVITE) 1 MG tablet Take 1 mg by mouth daily.    . furosemide (LASIX) 20 MG tablet TAKE 1 TABLET(20 MG) BY MOUTH DAILY AS NEEDED 90 tablet 0  . gabapentin (NEURONTIN) 300 MG capsule Take 1 capsule (300 mg total) by mouth 2 (two) times daily. 60 capsule 0  . hydrALAZINE (APRESOLINE) 25 MG tablet Take 1 tablet (25 mg total) by mouth 3 (three) times daily. 270 tablet 1  . hydrOXYzine (ATARAX/VISTARIL) 25 MG tablet Take 1 tablet (25 mg total) by mouth every 6 (six) hours as needed for anxiety. 30 tablet 0  . lisinopril (PRINIVIL,ZESTRIL) 20 MG tablet Take 1 tablet (20 mg total) by mouth 2 (two) times daily. 60 tablet 6  . nitroGLYCERIN (NITROSTAT) 0.4 MG SL tablet Place 0.4 mg under the tongue every 5 (five) minutes as needed for chest pain.    . nitroGLYCERIN (NITROSTAT) 0.4 MG SL tablet PLACE 1 TABLET UNDER THE TONGUE EVERY 5 MINUTES AS NEEDED FOR CHEST PAIN 25 tablet 4  . pantoprazole (PROTONIX) 40 MG tablet Take 1 tablet (40 mg total) by mouth daily. 30 tablet 0  . sertraline (ZOLOFT) 50 MG tablet Take 1 tablet (50 mg total) by mouth daily. For mood control (Patient taking differently: Take 50 mg by mouth daily. ) 30 tablet 0  . tamoxifen (NOLVADEX) 20 MG tablet TAKE 1 TABLET(20 MG) BY MOUTH DAILY (Patient taking  differently: Take 20 mg by mouth daily. ) 90 tablet 2  . traZODone (DESYREL) 50 MG tablet Take 1 tablet (50 mg total) by mouth at bedtime as needed for sleep. 30 tablet 0   No current facility-administered medications for this visit.    Facility-Administered Medications Ordered in Other Visits  Medication Dose Route Frequency Provider Last Rate Last Dose  . gadopentetate dimeglumine (MAGNEVIST) injection 12 mL  12 mL Intravenous Once PRN Sater, Richard A, MD      . gadopentetate dimeglumine (MAGNEVIST) injection 16 mL  16 mL Intravenous Once PRN Sater, Nanine Means, MD        PHYSICAL EXAMINATION: ECOG PERFORMANCE STATUS: 1 - Symptomatic but completely ambulatory  Vitals:   10/14/18 1521  BP: (!) 146/70  Pulse: 87  Resp: 17  Temp: 99.6 F (37.6 C)  SpO2: 100%   Filed Weights   10/14/18 1521  Weight: 164 lb 6.4 oz (74.6 kg)    GENERAL:alert, no distress and comfortable SKIN: skin color, texture, turgor are  normal, no rashes or significant lesions EYES: normal, Conjunctiva are pink and non-injected, sclera clear OROPHARYNX:no exudate, no erythema and lips, buccal mucosa, and tongue normal  NECK: supple, thyroid normal size, non-tender, without nodularity LYMPH:  no palpable lymphadenopathy in the cervical, axillary or inguinal LUNGS: clear to auscultation and percussion with normal breathing effort HEART: regular rate & rhythm and no murmurs and no lower extremity edema ABDOMEN:abdomen soft, non-tender and normal bowel sounds MUSCULOSKELETAL:no cyanosis of digits and no clubbing  NEURO: alert & oriented x 3 with fluent speech, no focal motor/sensory deficits EXTREMITIES: No lower extremity edema BREAST: No palpable masses or nodules in either right or left breasts. No palpable axillary supraclavicular or infraclavicular adenopathy no breast tenderness or nipple discharge. (exam performed in the presence of a chaperone)  LABORATORY DATA:  I have reviewed the data as listed CMP  Latest Ref Rng & Units 07/28/2018 07/28/2018 07/27/2018  Glucose 70 - 99 mg/dL 99 - 115(H)  BUN 6 - 20 mg/dL 13 - 13  Creatinine 0.44 - 1.00 mg/dL 1.27(H) 1.24(H) 1.28(H)  Sodium 135 - 145 mmol/L 137 - 139  Potassium 3.5 - 5.1 mmol/L 4.8 - 3.8  Chloride 98 - 111 mmol/L 112(H) - 111  CO2 22 - 32 mmol/L 18(L) - 21(L)  Calcium 8.9 - 10.3 mg/dL 8.4(L) - 9.1  Total Protein 6.0 - 8.5 g/dL - - -  Total Bilirubin 0.0 - 1.2 mg/dL - - -  Alkaline Phos 39 - 117 IU/L - - -  AST 0 - 40 IU/L - - -  ALT 0 - 32 IU/L - - -    Lab Results  Component Value Date   WBC 7.1 07/28/2018   HGB 9.2 (L) 07/28/2018   HCT 29.2 (L) 07/28/2018   MCV 84.9 07/28/2018   PLT 334 07/28/2018   NEUTROABS 7.1 (H) 04/01/2018    ASSESSMENT & PLAN:  Malignant neoplasm of lower-outer quadrant of right breast of female, estrogen receptor positive ([place]) 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  The patient has a good understanding of the overall plan. she agrees with it. she will call with any problems that may develop before the next visit here.   Harriette Ohara, MD 10/14/18

## 2018-12-02 ENCOUNTER — Telehealth: Payer: Self-pay | Admitting: Neurology

## 2018-12-02 NOTE — Telephone Encounter (Signed)
Appointment Request From: [name]    With Provider: Britt Bottom, MD [[facility]]    Preferred Date Range: 12/06/2018 - 12/06/2018    Preferred Times: Monday Afternoon, Tuesday Afternoon, Wednesday Afternoon, Thursday Afternoon, Friday Afternoon    Reason for visit: Request an Appointment    Comments:  If possible, can I be scheduled to see Dr. Felecia Shelling around 3:30pm or after on any day that he's available. It's been sometime since I last saw Dr. Felecia Shelling but, I've been suffering with a lot of pain in my legs and feet (from the knee down to my feet on both sides) when I try to walk and I need to get some more medication. I've been off my medication for a while because I didn't have medical insurance. I have new medical insurance [facility]) now and desperately need to see Dr. Felecia Shelling.

## 2019-01-16 ENCOUNTER — Other Ambulatory Visit: Payer: Self-pay | Admitting: Physician Assistant

## 2019-01-16 NOTE — Telephone Encounter (Signed)
Hydralazine refilled.

## 2019-02-05 ENCOUNTER — Telehealth: Payer: Self-pay | Admitting: *Deleted

## 2019-02-05 NOTE — Telephone Encounter (Signed)
Patient called back. I updated her chart information. She will make sure she received email. I walked her through [employer] process.

## 2019-02-05 NOTE — Telephone Encounter (Signed)
Called, LVM for pt to call office back before VV tomorrow with Dr. Felecia Shelling. I need to update medication list/pharmacy/allergies on file.

## 2019-02-06 ENCOUNTER — Other Ambulatory Visit: Payer: Self-pay

## 2019-02-06 ENCOUNTER — Encounter: Payer: Self-pay | Admitting: Neurology

## 2019-02-06 ENCOUNTER — Ambulatory Visit (INDEPENDENT_AMBULATORY_CARE_PROVIDER_SITE_OTHER): Payer: 59 | Admitting: Neurology

## 2019-02-06 DIAGNOSIS — R208 Other disturbances of skin sensation: Secondary | ICD-10-CM | POA: Diagnosis not present

## 2019-02-06 DIAGNOSIS — C50511 Malignant neoplasm of lower-outer quadrant of right female breast: Secondary | ICD-10-CM | POA: Diagnosis not present

## 2019-02-06 DIAGNOSIS — G373 Acute transverse myelitis in demyelinating disease of central nervous system: Secondary | ICD-10-CM | POA: Diagnosis not present

## 2019-02-06 DIAGNOSIS — R269 Unspecified abnormalities of gait and mobility: Secondary | ICD-10-CM

## 2019-02-06 DIAGNOSIS — Z170 Estrogen receptor positive status [ER+]: Secondary | ICD-10-CM

## 2019-02-06 MED ORDER — GABAPENTIN 300 MG PO CAPS: ORAL_CAPSULE | ORAL | 11 refills | Status: DC

## 2019-02-06 MED ORDER — TRAMADOL HCL 50 MG PO TABS: 50.0000 mg | ORAL_TABLET | Freq: Two times a day (BID) | ORAL | 5 refills | Status: DC | PRN

## 2019-02-06 MED ORDER — BACLOFEN 10 MG PO TABS: ORAL_TABLET | ORAL | 11 refills | Status: DC

## 2019-02-06 NOTE — Progress Notes (Signed)
[facility]  PATIENT: Anna Henson DOB: 12-Aug-1971  REFERRING DOCTOR OR PCP:  Rikki Spearing _________________________________   HISTORICAL  CHIEF COMPLAINT:  No chief complaint on file.   HISTORY OF PRESENT ILLNESS:  Anna Henson is a 48 yo woman with a transverse myelitis in late January 2017.     Update 02/06/2019: Virtual Visit via Video Note I connected with Anna Henson on 02/06/19 at  3:30 PM EDT by a video enabled telemedicine application and verified that I am speaking with the correct person.  I discussed the limitations of evaluation and management by telemedicine and the availability of in person appointments. The patient expressed understanding and agreed to proceed.  History of Present Illness: She is continuing to have numbness in her legs and feels weakness in her arms, especially when trying to raise them.  The right side is a little worse than her left and she has painful tingling dysesthesias.   Numbness is about the same as in 2018 but much bette than in 2017.  Weakness is about he same.  She feels gait is affected by the spasms in her legs.   She did better when on baclofen.    She still has leg spasticity and pain.  When she gets out of bed, she has the most pian in her legs.   She was on baclofen and tramadol with benefit.     She notes mild urgency but no urinary incontinence.   She has whole body jerks at times when she is drowsy.  She has right breast cancer and had surgery and radiation (no chemo planned).   She will be re-imaged soon.   She has had some lymphedema and did PT for this..      Observations/Objective: She is 163 pounds   127/82  Pulse 73.   She is a well-developed well-nourished woman in no acute distress.  The head is normocephalic and atraumatic.  Sclera are anicteric.  Visible skin appears normal.  The neck has a good range of motion.  Pharynx and tongue have normal appearance.  She is alert and fully oriented with fluent  speech and good attention, knowledge and memory.  Extraocular muscles are intact.  Facial strength is normal.  Palatal elevation and tongue protrusion are midline.  She appears to have normal strength in the arms.  Rapid alternating movements and finger-nose-finger are performed well.   Assessment and Plan: Transverse myelitis ([place])  Malignant neoplasm of lower-outer quadrant of right breast of female, estrogen receptor positive ([place])  Gait disturbance  Dysesthesia  1.  She had idiopathic transverse myelitis.  The MRI appearance is markedly improved to the initial event and she has no evidence of recurrence.   2.  her dysesthesias were better on gabapentin and tramadol and I will prescribe these for her. 3.  Baclofen for spasticity. 4.  Return in 1 year or sooner if there are new or worsening neurologic symptoms.  Follow Up Instructions: I discussed the assessment and treatment plan with the patient. The patient was provided an opportunity to ask questions and all were answered. The patient agreed with the plan and demonstrated an understanding of the instructions.    The patient was advised to call back or seek an in-person evaluation if the symptoms worsen or if the condition fails to improve as anticipated.  I provided 25 minutes of non-face-to-face time during this encounter.  ________________________________ From previous visits: Update 08/22/2017:   She had a transverse Myelitis involving the spinal  cord/brain from medulla to the C5 level.    She is still  noting some neck pain during the day.    She has a rare Lhermitte sign.   When present, the pain is mostly only in her neck now.     She is taking 300 mg x 5 = 1500 mg daily.    She tolerates it well.    She has spasms in her legs, much worse at night than the day.   Spasms [facility]) are on the left more than her right (was flipped last time).   The dysesthesias in her legs are just below her knee.      Spasms affect her  quality of sleep.  She goes to bed between 8 and 10 pm and gets up between 2 and 4 am and stays up.    She takes baclofen 10 mg po bid, one at night.     When drowsy, she often has large jerks.  This can occur night or day.     From 03/12/2017:  Transverse myelitis: In January 2017, she had the onset of numbness on the right and was found to have an enhancing cervical spine lesion on the MRI extending from the lower medulla on the right to C5.   Symptoms improved some afterwards.   In April, she reported more numbness and repeat imaging was performed.  The 02/02/16 cervical spine shows resolution of the prior enhancing focus that was seen at the cervicomedullary junction.    Dysesthesia:   She reports a dysesthetic sensation on the right with heat and stabbing and sometimes gets a milder sensation on the left in the arm.   Gabapentin helps the pain some.    She is on 300-300-600 mg over the day.    She now rarely gets a Lhermitte sign       Spasms:  She is experiencing muscles spasms on both sides, right > left.  She gets a Programmer, applications Horse spasm in the legs, right > left.   Rarely, she gets them in both legs.   These are worse when she lays down at night.   She gets milder leg spasms in her legs > arms during the day.     She takes baclofen 20 mg at night and it helps reduce spasms some.   She wakes up without grogginess if she takes an hour before bedtime.     Tramadol helps pain but causes a hangover.   Baclofen helps better  Gait/balance/strength    She notes balance is poor at times.  She stumbles some but no recent falls.    The right leg is mildly clumsy.    She notes the right hand trembles some and feels clumsy.   She sometimes drops items and has poor handwriting.  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   She notes typing speed dropped from 65 WPM to 45 WPM since the transverse melitis.    Bladder:   She denies urinary frequency.  No incontinence.  No hesitancy.      Cognitive/mood:   She feels she is not doing as well cognitively.   She was recently diagnosed with bipolar disease and PTSD.    She is on Abilify.     Sleep:   She falls asleep well most nights but always has trouble staying asleep.   She goes to bed around 10-11 pm.  Spasms usually awakens her multiple times, usually between  2 and 5 am.    Cardiac:   She had an MI 12/2016.   She has a RCA blockage and is on Brilinta.   She wore an even monitor and she reports it showed increased HR at night.     Labwork and the lupus anticoagulant and antiphospholipid tests were normal.  Transverse myelitis history:    In late January,  she had a right sided headache and went to bed.  When she woke up, she noted numbness in the right neck, shoulder and arm.   Numbness worsened over the next fe days.   She went to her PCP an was told she might have sinusitis and Amoxicillin was prescribed.   After a couple more days, she saw another doctor and had a neck soft tissue CT scan.   That CT scan showed that she had an increase of adenoid tissue and reactive type lymph nodes.  By that time, numbness extended to the fingertips on the riht but not into her leg.   She was told to go to the emergency room for further evaluation. In the emergency room she had an MRI of the cervical spine and the brain. The MRI of the cervical spine showed a focus that extended from the lower medulla on the right down to C5 posteriorly and more medially within the spinal cord. She also had some enhancement around the level of the cervical medullary junction. She was admitted to the hospital for further treatment and testing.    She was treated with 5 days of IV Solu-Medrol. During that time, her symptoms of numbness improved.  She has not returned to baseline.    She had many tests performed including lumbar puncture for CSF analysis and a thoracic spine MRI and serology blood tests. For the most part, the evaluation was negative.  Data:    MRI   has a longitudinal lesion extending from the lower medulla on the right down to. The enhancement does not extend up to the medulla.  Cervical spine also showed mild spinal stenosis at C3-C4 and mild to moderate spinal stenosis at C5-C6 and mild spinal stenosis at C6-C7. There is some foraminal narrowing at those levels but no definite nerve root compression.  Elsewhere the brain, there were some subcortical and deep white matter T2/FLAIR hyperintense foci but not in a pattern that would be typical for MS. The thoracic spine was normal.    I also reviewed her many laboratory tests performed while she was at [employer]. Neuromyelitis optica antibodies are negative. She did not have oligoclonal bands. ANCA, ANA, SSA/SSB, ACE and Vit B-12 were all normal or negative.  She had Epstein-Barr IgG but not IgM. CSF cell counts did not show increase in white blood cells.  REVIEW OF SYSTEMS: Constitutional: No fevers, chills, sweats, or change in appetite.  She notes some fatigue Eyes: No visual changes, double vision, eye pain Ear, nose and throat: No hearing loss, ear pain, nasal congestion, sore throat Cardiovascular: No chest pain, palpitations.   H/O angina, CAD with stent Respiratory: No shortness of breath at rest or with exertion.   No wheezes GastrointestinaI: No nausea, vomiting, diarrhea, abdominal pain, fecal incontinence Genitourinary: Mild urgency and frequency.  No nocturia. Musculoskeletal: No neck pain, back pain Integumentary: No rash, pruritus, skin lesions Neurological: as above Psychiatric: No depression at this time.  No anxiety Endocrine: No palpitations, diaphoresis, change in appetite, change in weigh or increased thirst Hematologic/Lymphatic: No anemia, purpura, petechiae. Allergic/Immunologic: No itchy/runny  eyes, nasal congestion, recent allergic reactions, rashes  ALLERGIES: Allergies  Allergen Reactions   Pork-Derived Products Other (See Comments)    Does NOT eat pork     HOME MEDICATIONS:  Current Outpatient Medications:    acetaminophen (TYLENOL) 325 MG tablet, Take 325-650 mg by mouth every 8 (eight) hours as needed (for pain or cramping). , Disp: , Rfl:    amLODipine (NORVASC) 10 MG tablet, Take 1 tablet (10 mg total) by mouth daily., Disp: 30 tablet, Rfl: 6   ARIPiprazole (ABILIFY) 10 MG tablet, Take 1 tablet (10 mg total) by mouth daily. For mood control (Patient taking differently: Take 10 mg by mouth daily. ), Disp: 30 tablet, Rfl: 0   aspirin 81 MG chewable tablet, Chew 1 tablet (81 mg total) by mouth daily., Disp: , Rfl:    atorvastatin (LIPITOR) 80 MG tablet, TAKE 1 TABLET(80 MG) BY MOUTH DAILY (Patient taking differently: Take by mouth daily at 6 PM. ), Disp: 90 tablet, Rfl: 3   baclofen (LIORESAL) 10 MG tablet, Take up to 4/day for muscle spasms, Disp: 120 each, Rfl: 11   carvedilol (COREG) 12.5 MG tablet, Take 1 tablet (12.5 mg total) by mouth 2 (two) times daily with a meal., Disp: 180 tablet, Rfl: 3   clonazePAM (KLONOPIN) 1 MG tablet, Take 0.5 mg by mouth every evening., Disp: , Rfl: 5   folic acid (FOLVITE) 1 MG tablet, Take 1 mg by mouth daily., Disp: , Rfl:    furosemide (LASIX) 20 MG tablet, TAKE 1 TABLET(20 MG) BY MOUTH DAILY AS NEEDED, Disp: 90 tablet, Rfl: 0   gabapentin (NEURONTIN) 300 MG capsule, One po qAm, one po qPM and two po qHS, Disp: 120 capsule, Rfl: 11   hydrALAZINE (APRESOLINE) 25 MG tablet, TAKE 1 TABLET BY MOUTH THREE TIMES DAILY, Disp: 270 tablet, Rfl: 1   hydrOXYzine (ATARAX/VISTARIL) 25 MG tablet, Take 1 tablet (25 mg total) by mouth every 6 (six) hours as needed for anxiety., Disp: 30 tablet, Rfl: 0   lisinopril (PRINIVIL,ZESTRIL) 20 MG tablet, Take 1 tablet (20 mg total) by mouth 2 (two) times daily., Disp: 60 tablet, Rfl: 6   nitroGLYCERIN (NITROSTAT) 0.4 MG SL tablet, Place 0.4 mg under the tongue every 5 (five) minutes as needed for chest pain., Disp: , Rfl:    nitroGLYCERIN (NITROSTAT) 0.4 MG SL  tablet, PLACE 1 TABLET UNDER THE TONGUE EVERY 5 MINUTES AS NEEDED FOR CHEST PAIN, Disp: 25 tablet, Rfl: 4   pantoprazole (PROTONIX) 40 MG tablet, Take 1 tablet (40 mg total) by mouth daily., Disp: 30 tablet, Rfl: 0   sertraline (ZOLOFT) 50 MG tablet, Take 1 tablet (50 mg total) by mouth daily. For mood control (Patient taking differently: Take 50 mg by mouth daily. ), Disp: 30 tablet, Rfl: 0   tamoxifen (NOLVADEX) 20 MG tablet, Take 1 tablet (20 mg total) by mouth daily., Disp: 90 tablet, Rfl: 3   traMADol (ULTRAM) 50 MG tablet, Take 1 tablet (50 mg total) by mouth 2 (two) times daily as needed., Disp: 60 tablet, Rfl: 5 No current facility-administered medications for this visit.   Facility-Administered Medications Ordered in Other Visits:    gadopentetate dimeglumine (MAGNEVIST) injection 12 mL, 12 mL, Intravenous, Once PRN, Cyncere Ruhe A, MD   gadopentetate dimeglumine (MAGNEVIST) injection 16 mL, 16 mL, Intravenous, Once PRN, Magenta Schmiesing, Nanine Means, MD  PAST MEDICAL HISTORY: Past Medical History:  Diagnosis Date   Anxiety    Bipolar disorder ([place])    Breast cancer, right breast (  [place]) ~ 03/2017   CAD in native artery    a. 09/2014 Cath/PCI in setting of [place] - s/p 2.25 x 12 mm Promus Premier DES to the RCA;  b. 11/2016 Myoview: EF 56%, no ischemia;  c. 12/2016 NSTEMI/PCI: LM nl, LAd 25p, LCX 30p, RCA 100p (2.75x38 Promus Premier DES), mRCA 10 ISR, EF 50-55%.   Chronic kidney disease    she sthinks has stage 2 CKD, has had US doppler and has some stenosis , this was done in [place]    CKD (chronic kidney disease), stage II    Depression    Dysrhythmia    Tachycardia in evenings   Genetic testing 04/23/2017   Ms. Whittley underwent genetic counseling and testing for hereditary cancer syndromes on 04/05/2017. Her results were negative for mutations in all 46 genes analyzed by [facility]'s 46-gene Common Hereditary Cancers Panel. Genes analyzed include: APC, ATM, AXIN2, BARD1, BMPR1A,  BRCA1, BRCA2, BRIP1, CDH1, CDKN2A, CHEK2, CTNNA1, DICER1, EPCAM, GREM1, HOXB13, KIT, MEN1, MLH1, MSH2, MSH3, MSH6, MUTYH, NBN,   GERD (gastroesophageal reflux disease)    Headache    "bi-weekly" (05/07/2017)   Heart attack ([place]) 12/31/2016   HOCM (hypertrophic obstructive cardiomyopathy) ([place])    a. 12/2016 Echo: EF 65-70%,  mod conc LVH, dynamic obstruction @ rest, peak velocity of 291 cm/sec w/ peak gradient of 17mHg, no rwma, Gr1 DD, triv TR, PASP 160mg.   Hyperlipidemia    Hypertension    > 5 years   Migraine    "probably 1/month" (05/07/2017)   Palpitations    Pneumonia    "twice when I was a child" (05/07/2017)   Quit using tobacco in remote past    quit in 2016   [place]    PAST SURGICAL HISTORY: Past Surgical History:  Procedure Laterality Date   ABDOMINAL HERNIA REPAIR  ~ 2001; ~ 2005   BREAST BIOPSY Right ~ 03/2017 X 2   BREAST LUMPECTOMY WITH RADIOACTIVE SEED AND SENTINEL LYMPH NODE BIOPSY Right 06/21/2017   Procedure: RIGHT BREAST BRACKETED SEED GUIDED LUMPECTOMY AND SENTINEL LYMPH NODE BIOPSY;  Surgeon: ToAutumn MessingII, MD;  [place] [facility] Service: General;  Laterality: Right;   CORONARY ANGIOPLASTY WITH STENT PLACEMENT     L main OK, LAD OK, CFX 30%, RCA 90%-0% w/ 2.25 x 12 mm Promus Premier DES, EF 55%   CORONARY/GRAFT ACUTE MI REVASCULARIZATION N/A 12/31/2016   Procedure: Coronary/Graft Acute MI Revascularization;  Surgeon: MiSherren MochaMD;  [facility];  Service: Cardiovascular;  Laterality: N/A;   HERNIA REPAIR     times 2  umbilical   LEFT HEART CATH AND CORONARY ANGIOGRAPHY N/A 12/31/2016   Procedure: Left Heart Cath and Coronary Angiography;  Surgeon: MiSherren MochaMD;  [facility];  Service: Cardiovascular;  Laterality: N/A;   LEFT HEART CATHETERIZATION WITH CORONARY ANGIOGRAM N/A 09/14/2014   Procedure: LEFT HEART CATHETERIZATION WITH CORONARY ANGIOGRAM;  Surgeon: ChBurnell BlanksMD;  [facility];  Service: Cardiovascular;  Laterality: N/A;   PERCUTANEOUS CORONARY STENT INTERVENTION (PCI-S)  09/14/2014   Procedure: PERCUTANEOUS CORONARY STENT INTERVENTION (PCI-S);  Surgeon: ChBurnell BlanksMD;  [facility];  Service: Cardiovascular;;   TONSILLECTOMY  2005    FAMILY HISTORY: Family History  Problem Relation Age of Onset   Diabetes Mother    Heart disease Mother    Hyperlipidemia Mother    Hypertension Mother    Lung cancer Father  Drug abuse Father    Brain cancer Father 6   Bone cancer Father 48   Drug abuse Sister    Mental illness Brother    Mental illness Maternal Grandmother    Stomach cancer Paternal Grandmother 26       d.75    SOCIAL HISTORY:  Social History   Socioeconomic History   Marital status: Single    Spouse name: Not on file   Number of children: 0   Years of education: masters   Highest education level: Not on file  Occupational History   Occupation: Therapist, art at a call center    Employer: [employer] resource strain: Somewhat hard   Food insecurity:    Worry: Often true    Inability: Often true   Transportation needs:    Medical: No    Non-medical: No  Tobacco Use   Smoking status: Former Smoker    Packs/day: 0.50    Years: 30.00    Pack years: 15.00    Types: Cigarettes    Last attempt to quit: 10/05/2015    Years since quitting: 3.3   Smokeless tobacco: Never Used  Substance and Sexual Activity   Alcohol use: Yes    Alcohol/week: 2.0 standard drinks    Types: 2 Cans of beer per week   Drug use: No   Sexual activity: Not Currently  Lifestyle   Physical activity:    Days per week: 1 day    Minutes per session: 10 min   Stress: Very much  Relationships   Social connections:    Talks on phone: More than three times a week    Gets together: Once a week    Attends religious service: 1 to 4 times per year     Active member of club or organization: No    Attends meetings of clubs or organizations: Never    Relationship status: Never married   Intimate partner violence:    Fear of current or ex partner: No    Emotionally abused: No    Physically abused: No    Forced sexual activity: No  Other Topics Concern   Not on file  Social History Narrative   Consumes 2 cups of caffeine daily     PHYSICAL EXAM  There were no vitals filed for this visit.  There is no height or weight on file to calculate BMI.   General: The patient is well-developed and well-nourished and in no acute distress  Neurologic Exam  Mental status: The patient is alert and oriented x 3 at the time of the examination. The patient has apparent normal recent and remote memory, with an apparently normal attention span and concentration ability.   Speech is normal.  Cranial nerves: Extraocular movements are full.   Facial strength and sensation is normal. Trapezius strength is normal    No obvious hearing deficits are noted.  Motor:  Muscle bulk is normal.  Tone is increased in legs, right greater than left. Strength is 5/5.  Sensory: Touch and vibration are more symmetric now..     Gait and station: Station is normal.   Gait is spastic (right) and mildly wide. Tandem gait is mildly wide.    Reflexes: Deep tendon reflexes are symmetric and increased in legs, right greater than left. There is spread, right greater than left. 2 beats of nonsustained ankle clonus on the right.   DIAGNOSTIC DATA (LABS, IMAGING, TESTING) - I reviewed patient records, labs, notes,  testing and imaging myself where available.  Lab Results  Component Value Date   WBC 7.1 07/28/2018   HGB 9.2 (L) 07/28/2018   HCT 29.2 (L) 07/28/2018   MCV 84.9 07/28/2018   PLT 334 07/28/2018      Component Value Date/Time   NA 137 07/28/2018 0558   NA 140 07/09/2018 1509   NA 139 04/04/2017 0835   K 4.8 07/28/2018 0558   K 3.6 04/04/2017 0835   CL  112 (H) 07/28/2018 0558   CO2 18 (L) 07/28/2018 0558   CO2 25 04/04/2017 0835   GLUCOSE 99 07/28/2018 0558   GLUCOSE 83 04/04/2017 0835   BUN 13 07/28/2018 0558   BUN 14 07/09/2018 1509   BUN 13.9 04/04/2017 0835   CREATININE 1.27 (H) 07/28/2018 0558   CREATININE 1.19 (H) 04/01/2018 1015   CREATININE 1.3 (H) 04/04/2017 0835   CALCIUM 8.4 (L) 07/28/2018 0558   CALCIUM 10.0 04/04/2017 0835   PROT 7.7 07/09/2018 1509   PROT 6.7 04/04/2017 0835   ALBUMIN 4.8 07/09/2018 1509   ALBUMIN 3.9 04/04/2017 0835   AST 19 07/09/2018 1509   AST 18 04/01/2018 1015   AST 23 04/04/2017 0835   ALT 16 07/09/2018 1509   ALT 11 04/01/2018 1015   ALT 22 04/04/2017 0835   ALKPHOS 71 07/09/2018 1509   ALKPHOS 69 04/04/2017 0835   BILITOT 0.2 07/09/2018 1509   BILITOT <0.2 (L) 04/01/2018 1015   BILITOT <0.22 04/04/2017 0835   GFRNONAA 50 (L) 07/28/2018 0558   GFRNONAA 54 (L) 04/01/2018 1015   GFRNONAA 56 (L) 11/24/2015 1113   GFRAA 58 (L) 07/28/2018 0558   GFRAA >60 04/01/2018 1015   GFRAA 65 11/24/2015 1113   Lab Results  Component Value Date   CHOL 149 07/09/2018   HDL 42 07/09/2018   LDLCALC 87 07/09/2018   TRIG 99 07/09/2018   CHOLHDL 3.5 07/09/2018   Lab Results  Component Value Date   HGBA1C 5.8 02/22/2016   Lab Results  Component Value Date   VGKKDPTE70 761 11/12/2015   Lab Results  Component Value Date   TSH 1.648 12/31/2016       ASSESSMENT AND PLAN  Transverse myelitis ([facility])  Malignant neoplasm of lower-outer quadrant of right breast of female, estrogen receptor positive ([facility])  Gait disturbance  Dysesthesia   Belvin Gauss A. Felecia Shelling, MD, PhD 02/24/3436, 3:57 PM Certified in Neurology, Clinical Neurophysiology, Sleep Medicine, Pain Medicine and Neuroimaging  [facility] [address], [place], [place] [address] [phone]

## 2019-03-24 ENCOUNTER — Other Ambulatory Visit: Payer: Self-pay | Admitting: Internal Medicine

## 2019-03-24 ENCOUNTER — Other Ambulatory Visit: Payer: Self-pay | Admitting: Physician Assistant

## 2019-03-24 DIAGNOSIS — E785 Hyperlipidemia, unspecified: Secondary | ICD-10-CM

## 2019-03-27 ENCOUNTER — Encounter: Payer: Self-pay | Admitting: Hematology and Oncology

## 2019-05-05 ENCOUNTER — Telehealth: Payer: Self-pay | Admitting: *Deleted

## 2019-05-05 ENCOUNTER — Telehealth: Payer: Self-pay

## 2019-05-05 ENCOUNTER — Inpatient Hospital Stay: Payer: 59 | Attending: Hematology and Oncology | Admitting: Hematology and Oncology

## 2019-05-05 ENCOUNTER — Other Ambulatory Visit: Payer: Self-pay

## 2019-05-05 DIAGNOSIS — C50511 Malignant neoplasm of lower-outer quadrant of right female breast: Secondary | ICD-10-CM | POA: Diagnosis not present

## 2019-05-05 DIAGNOSIS — N644 Mastodynia: Secondary | ICD-10-CM | POA: Insufficient documentation

## 2019-05-05 DIAGNOSIS — Z170 Estrogen receptor positive status [ER+]: Secondary | ICD-10-CM | POA: Diagnosis not present

## 2019-05-05 NOTE — Telephone Encounter (Signed)
"  Anna Henson [phone]).  What time am I scheduled to come in today?  Spoke with someone about issues with my breasts yesterday; told to  to call today.  Yesterday I touched my left breast I feel a lump and pain close to the rib cage.  Last night began feeling pain to my right breast to touch."  [facility] facsimile communication received for provider review.  Request call transfer.

## 2019-05-05 NOTE — Telephone Encounter (Signed)
RN spoke with patient.  Patient was informed by on-call over weekend that she could be seen today due to left breast lump.  Pt requesting appointment today for lump to left breast.    RN offered appointment for this morning, per patient request.

## 2019-05-05 NOTE — Assessment & Plan Note (Addendum)
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  On further evaluation also tenderness in the right breast lower inner quadrant.  Plan: ultrasound in both breasts. Further testing based upon the results of the tests  Return to clinic based on the results of the ultrasound to review the results. If everything is okay then we will see the patient in January as previously scheduled.

## 2019-05-05 NOTE — Progress Notes (Signed)
Patient Care Team: Patient, No Pcp Per as PCP - General (General Practice) Hilty, Nadean Corwin, MD as PCP - Cardiology (Cardiology) Sater, Nanine Means, MD as Consulting Physician (Neurology) Jovita Kussmaul, MD as Consulting Physician (General Surgery) Nicholas Lose, MD as Consulting Physician (Hematology and Oncology) Kyung Rudd, MD as Consulting Physician (Radiation Oncology)  DIAGNOSIS:  Encounter Diagnosis  Name Primary?  . Malignant neoplasm of lower-outer quadrant of right breast of female, estrogen receptor positive ([place])     SUMMARY OF ONCOLOGIC HISTORY: Oncology History  Malignant neoplasm of lower-outer quadrant of right breast of female, estrogen receptor positive ([place])  03/26/2017 Initial Diagnosis   Palpable right breast mass with a normal mammogram: 8:00 position by ultrasound 3.2 cm irregular mass: Grade 2 invasive lobular cancer ER 85%, PR 100%, Ki-67 2%, HER-2 negative ratio 1.5, T2 N0 stage II a clinical stage   04/20/2017 Genetic Testing   Genetic counseling and testing for hereditary cancer syndromes performed on . Results are negative for pathogenic mutations in 46 genes analyzed by [facility]'s Common Hereditary Cancers Panel. Results are dated . Genes tested: APC, ATM, AXIN2, BARD1, BMPR1A, BRCA1, BRCA2, BRIP1, CDH1, CDKN2A, CHEK2, CTNNA1, DICER1, EPCAM, GREM1, HOXB13, KIT, MEN1, MLH1, MSH2, MSH3, MSH6, MUTYH, NBN, NF1, NTHL1, PALB2, PDGFRA, PMS2, POLD1, POLE, PTEN, RAD50, RAD51C, RAD51D, SDHA, SDHB, SDHC, SDHD, SMAD4, SMARCA4, STK11, TP53, TSC1, TSC2, and VHL.  Variants of uncertain significance (not clinically actionable) were noted in APC and BRIP1.      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  She had a mammogram in June which was normal.  She started noticing an extremely tender spot in the lower inner quadrant of bilateral breasts.  There is no palpable lumps or nodules.  Because this is been lasting for more than 3 to 4 days she decided to come in for an urgent checkup.  She has not lifted any heavy objects and does not remember hurting herself.  REVIEW OF SYSTEMS:   Constitutional: Denies fevers, chills or abnormal weight loss Eyes: Denies blurriness of vision Ears, nose, mouth, throat, and face: Denies mucositis or sore throat Respiratory: Denies cough, dyspnea or wheezes Cardiovascular: Denies palpitation, chest discomfort Gastrointestinal:  Denies nausea, heartburn or change in bowel habits Skin: Denies abnormal skin rashes Lymphatics: Denies new lymphadenopathy or easy bruising Neurological:Denies numbness, tingling or new weaknesses Behavioral/Psych: Mood is stable, no new changes  Extremities: No lower extremity edema Breast: Bilateral upper inner quadrant breast tenderness All other systems were reviewed with the patient and are negative.  I have reviewed the past medical history, past surgical history, social history and family history with the patient and they are unchanged from previous note.  ALLERGIES:  is allergic to pork-derived products.  MEDICATIONS:  Current Outpatient Medications  Medication Sig Dispense Refill  . acetaminophen (TYLENOL) 325 MG tablet Take  325-650 mg by mouth every 8 (eight) hours as needed (for pain or cramping).     Marland Kitchen amLODipine (NORVASC) 10 MG tablet TAKE 1 TABLET(10 MG) BY MOUTH DAILY 30  tablet 6  . ARIPiprazole (ABILIFY) 10 MG tablet Take 1 tablet (10 mg total) by mouth daily. For mood control (Patient taking differently: Take 10 mg by mouth daily. ) 30 tablet 0  . aspirin 81 MG chewable tablet Chew 1 tablet (81 mg total) by mouth daily.    Marland Kitchen atorvastatin (LIPITOR) 80 MG tablet TAKE 1 TABLET(80 MG) BY MOUTH DAILY 90 tablet 1  . baclofen (LIORESAL) 10 MG tablet Take up to 4/day for muscle spasms 120 each 11  . carvedilol (COREG) 12.5 MG tablet Take 1 tablet (12.5 mg total) by mouth 2 (two) times daily with a meal. 180 tablet 3  . clonazePAM (KLONOPIN) 1 MG tablet Take 0.5 mg by mouth every evening.  5  . folic acid (FOLVITE) 1 MG tablet Take 1 mg by mouth daily.    . furosemide (LASIX) 20 MG tablet TAKE 1 TABLET(20 MG) BY MOUTH DAILY AS NEEDED 90 tablet 0  . gabapentin (NEURONTIN) 300 MG capsule One po qAm, one po qPM and two po qHS 120 capsule 11  . hydrALAZINE (APRESOLINE) 25 MG tablet TAKE 1 TABLET BY MOUTH THREE TIMES DAILY 270 tablet 1  . hydrOXYzine (ATARAX/VISTARIL) 25 MG tablet Take 1 tablet (25 mg total) by mouth every 6 (six) hours as needed for anxiety. 30 tablet 0  . lisinopril (ZESTRIL) 20 MG tablet TAKE 1 TABLET(20 MG) BY MOUTH TWICE DAILY 60 tablet 6  . nitroGLYCERIN (NITROSTAT) 0.4 MG SL tablet Place 0.4 mg under the tongue every 5 (five) minutes as needed for chest pain.    . nitroGLYCERIN (NITROSTAT) 0.4 MG SL tablet PLACE 1 TABLET UNDER THE TONGUE EVERY 5 MINUTES AS NEEDED FOR CHEST PAIN 25 tablet 4  . pantoprazole (PROTONIX) 40 MG tablet Take 1 tablet (40 mg total) by mouth daily. 30 tablet 0  . sertraline (ZOLOFT) 50 MG tablet Take 1 tablet (50 mg total) by mouth daily. For mood control (Patient taking differently: Take 50 mg by mouth daily. ) 30 tablet 0  . tamoxifen (NOLVADEX) 20 MG tablet Take 1 tablet (20 mg total) by mouth daily. 90 tablet 3  . traMADol (ULTRAM) 50 MG tablet Take 1 tablet (50 mg total) by mouth 2 (two) times daily as needed. 60 tablet 5    No current facility-administered medications for this visit.    Facility-Administered Medications Ordered in Other Visits  Medication Dose Route Frequency Provider Last Rate Last Dose  . gadopentetate dimeglumine (MAGNEVIST) injection 12 mL  12 mL Intravenous Once PRN Sater, Richard A, MD      . gadopentetate dimeglumine (MAGNEVIST) injection 16 mL  16 mL Intravenous Once PRN Sater, Nanine Means, MD        PHYSICAL EXAMINATION: ECOG PERFORMANCE STATUS: 1 - Symptomatic but completely ambulatory  Vitals:   05/05/19 1047  BP: 132/80  Pulse: 80  Resp: 17  Temp: 98.7 F (37.1 C)  SpO2: 100%   Filed Weights   05/05/19 1047  Weight: 153 lb 11.2 oz (69.7 kg)    GENERAL:alert, no distress and comfortable SKIN: skin color, texture, turgor are normal, no rashes or significant lesions EYES: normal, Conjunctiva are pink and non-injected, sclera clear OROPHARYNX:no exudate, no erythema and lips, buccal mucosa, and tongue normal  NECK: supple, thyroid normal size, non-tender, without nodularity LYMPH:  no  palpable lymphadenopathy in the cervical, axillary or inguinal LUNGS: clear to auscultation and percussion with normal breathing effort HEART: regular rate & rhythm and no murmurs and no lower extremity edema ABDOMEN:abdomen soft, non-tender and normal bowel sounds MUSCULOSKELETAL:no cyanosis of digits and no clubbing  NEURO: alert & oriented x 3 with fluent speech, no focal motor/sensory deficits EXTREMITIES: No lower extremity edema BREAST: No palpable masses or nodules in either right or left breasts. No palpable axillary supraclavicular or infraclavicular adenopathy.  There is a severely tender spot in the lower inner quadrant of both her breasts.  There is no palpable nodularity there.  (exam performed in the presence of a chaperone)  LABORATORY DATA:  I have reviewed the data as listed CMP Latest Ref Rng & Units 07/28/2018 07/28/2018 07/27/2018  Glucose 70 - 99 mg/dL 99 - 115(H)  BUN  6 - 20 mg/dL 13 - 13  Creatinine 0.44 - 1.00 mg/dL 1.27(H) 1.24(H) 1.28(H)  Sodium 135 - 145 mmol/L 137 - 139  Potassium 3.5 - 5.1 mmol/L 4.8 - 3.8  Chloride 98 - 111 mmol/L 112(H) - 111  CO2 22 - 32 mmol/L 18(L) - 21(L)  Calcium 8.9 - 10.3 mg/dL 8.4(L) - 9.1  Total Protein 6.0 - 8.5 g/dL - - -  Total Bilirubin 0.0 - 1.2 mg/dL - - -  Alkaline Phos 39 - 117 IU/L - - -  AST 0 - 40 IU/L - - -  ALT 0 - 32 IU/L - - -    Lab Results  Component Value Date   WBC 7.1 07/28/2018   HGB 9.2 (L) 07/28/2018   HCT 29.2 (L) 07/28/2018   MCV 84.9 07/28/2018   PLT 334 07/28/2018   NEUTROABS 7.1 (H) 04/01/2018    ASSESSMENT & PLAN:  Malignant neoplasm of lower-outer quadrant of right breast of female, estrogen receptor positive ([place]) 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  On further evaluation also tenderness in the bilateral breasts at the lower inner quadrants.  Plan: ultrasound in both breasts. Further testing based upon the results of the tests  Return to clinic based on the results of the ultrasound to review the results. If everything is okay then we will see the patient in January as previously scheduled.     Orders Placed This Encounter  Procedures  . US BREAST LTD UNI LEFT INC AXILLA    Standing Status:   Future    Standing Expiration Date:   07/05/2020    Order Specific Question:   Reason for Exam  (SYMPTOM  OR DIAGNOSIS REQUIRED)    Answer:   Bilateral lower inner quadrant tenderness    Order Specific Question:   Preferred imaging location?    Answer:   External    Comments:   [facility]  . US BREAST LTD UNI RIGHT INC AXILLA    Standing Status:   Future    Standing Expiration Date:   07/05/2020    Order Specific Question:   Reason for Exam (SYMPTOM  OR DIAGNOSIS REQUIRED)    Answer:   Bil lower inner quadrant  tenderness    Order Specific Question:   Preferred imaging location?    Answer:   External    Comments:   [facility]   The patient has a good understanding of the overall plan. she agrees with it. she will call with any problems that may develop before the next visit here.   Harriette Ohara, MD 05/05/19

## 2019-05-06 ENCOUNTER — Encounter: Payer: Self-pay | Admitting: Hematology and Oncology

## 2019-07-14 ENCOUNTER — Telehealth: Payer: 59 | Admitting: Nurse Practitioner

## 2019-07-14 ENCOUNTER — Other Ambulatory Visit: Payer: Self-pay

## 2019-07-14 DIAGNOSIS — R058 Other specified cough: Secondary | ICD-10-CM

## 2019-07-14 DIAGNOSIS — Z20828 Contact with and (suspected) exposure to other viral communicable diseases: Secondary | ICD-10-CM

## 2019-07-14 DIAGNOSIS — Z20822 Contact with and (suspected) exposure to covid-19: Secondary | ICD-10-CM

## 2019-07-14 MED ORDER — BENZONATATE 100 MG PO CAPS: 100.0000 mg | ORAL_CAPSULE | Freq: Three times a day (TID) | ORAL | 0 refills | Status: DC | PRN

## 2019-07-14 NOTE — Progress Notes (Signed)
E-Visit for Corona Virus Screening   Your current symptoms could be consistent with the coronavirus.  Many health care providers can now test patients at their office but not all are.  [facility] has multiple testing sites. For information on our COVID testing locations and hours go to [email or web address]  Please quarantine yourself while awaiting your test results.  We are enrolling you in our MyChart Home Mont[facility] for COVID19 . Daily you will receive a questionnaire within the MyChart website. Our COVID 19 response team willl be monitoriing your responses daily.  You can go to one of the  testing sites listed below, while they are opened (see hours). You do not need a doctors order to be tested for covid.You do need to self-isolate until your results return and if positive 14 days from when your symptoms started and until you are 3 days symptom free.   Testing Locations (Monday - Friday, 8 a.m. - 3:30 p.m.) . [place]: [facility], [address], [place], [place]  . [place]: [facility], [address], , [place] (entrance off [address])  . [place]: [address], [place], [place] (across from [facility])    COVID-19 is a respiratory illness with symptoms that are similar to the flu. Symptoms are typically mild to moderate, but there have been cases of severe illness and death due to the virus. The following symptoms may appear 2-14 days after exposure: . Fever . Cough . Shortness of breath or difficulty breathing . Chills . Repeated shaking with chills . Muscle pain . Headache . Sore throat . New loss of taste or smell . Fatigue . Congestion or runny nose . Nausea or vomiting . Diarrhea  It is vitally important that if you feel that you have an infection such as this virus or any other virus that you stay home and away from places where you may  spread it to others.  You should self-quarantine for 14 days if you have symptoms that could potentially be coronavirus or have been in close contact a with a person diagnosed with COVID-19 within the last 2 weeks. You should avoid contact with people age 65 and older.   You should wear a mask or cloth face covering over your nose and mouth if you must be around other people or animals, including pets (even at home). Try to stay at least 6 feet away from other people. This will protect the people around you.  You can use medication such as A prescription cough medication called Tessalon Perles 100 mg. You may take 1-2 capsules every 8 hours as needed for cough  You may also take acetaminophen (Tylenol) as needed for fever.   Reduce your risk of any infection by using the same precautions used for avoiding the common cold or flu:  . Wash your hands often with soap and warm water for at least 20 seconds.  If soap and water are not readily available, use an alcohol-based hand sanitizer with at least 60% alcohol.  . If coughing or sneezing, cover your mouth and nose by coughing or sneezing into the elbow areas of your shirt or coat, into a tissue or into your sleeve (not your hands). . Avoid shaking hands with others and consider head nods or verbal greetings only. . Avoid touching your eyes, nose, or mouth with unwashed hands.  . Avoid close contact with people who are sick. . Avoid places or   events with large numbers of people in one location, like concerts or sporting events. . Carefully consider travel plans you have or are making. . If you are planning any travel outside or inside the [place], visit the CDC's Travelers' Health webpage for the latest health notices. . If you have some symptoms but not all symptoms, continue to monitor at home and seek medical attention if your symptoms worsen. . If you are having a medical emergency, call 911.  HOME CARE . Only take medications as instructed by your  medical team. . Drink plenty of fluids and get plenty of rest. . A steam or ultrasonic humidifier can help if you have congestion.   GET HELP RIGHT AWAY IF YOU HAVE EMERGENCY WARNING SIGNS** FOR COVID-19. If you or someone is showing any of these signs seek emergency medical care immediately. Call 911 or proceed to your closest emergency facility if: . You develop worsening high fever. . Trouble breathing . Bluish lips or face . Persistent pain or pressure in the chest . New confusion . Inability to wake or stay awake . You cough up blood. . Your symptoms become more severe  **This list is not all possible symptoms. Contact your medical provider for any symptoms that are sever or concerning to you.   MAKE SURE YOU   Understand these instructions.  Will watch your condition.  Will get help right away if you are not doing well or get worse.  Your e-visit answers were reviewed by a board certified advanced clinical practitioner to complete your personal care plan.  Depending on the condition, your plan could have included both over the counter or prescription medications.  If there is a problem please reply once you have received a response from your provider.  Your safety is important to us.  If you have drug allergies check your prescription carefully.    You can use MyChart to ask questions about today's visit, request a non-urgent call back, or ask for a work or school excuse for 24 hours related to this e-Visit. If it has been greater than 24 hours you will need to follow up with your provider, or enter a new e-Visit to address those concerns. You will get an e-mail in the next two days asking about your experience.  I hope that your e-visit has been valuable and will speed your recovery. Thank you for using e-visits.   5-10 minutes spent reviewing and documenting in chart.  

## 2019-07-16 LAB — NOVEL CORONAVIRUS, NAA: SARS-CoV-2, NAA: NOT DETECTED

## 2019-08-28 ENCOUNTER — Other Ambulatory Visit: Payer: Self-pay | Admitting: Obstetrics

## 2019-08-29 ENCOUNTER — Encounter (HOSPITAL_COMMUNITY)
Admission: RE | Admit: 2019-08-29 | Discharge: 2019-08-29 | Disposition: A | Discharge status: Home / Self Care | Payer: 59 | Source: Ambulatory Visit | Attending: Obstetrics | Admitting: Obstetrics

## 2019-08-29 ENCOUNTER — Other Ambulatory Visit: Payer: Self-pay

## 2019-08-29 ENCOUNTER — Other Ambulatory Visit (HOSPITAL_COMMUNITY)
Admission: RE | Admit: 2019-08-29 | Discharge: 2019-08-29 | Disposition: A | Discharge status: Home / Self Care | Payer: 59 | Source: Ambulatory Visit | Attending: Obstetrics | Admitting: Obstetrics

## 2019-08-29 ENCOUNTER — Telehealth: Payer: Self-pay | Admitting: Internal Medicine

## 2019-08-29 DIAGNOSIS — Z20828 Contact with and (suspected) exposure to other viral communicable diseases: Secondary | ICD-10-CM | POA: Insufficient documentation

## 2019-08-29 DIAGNOSIS — Z01818 Encounter for other preprocedural examination: Secondary | ICD-10-CM | POA: Insufficient documentation

## 2019-08-29 LAB — BASIC METABOLIC PANEL
Anion gap: 9 (ref 5–15)
BUN: 9 mg/dL (ref 6–20)
CO2: 21 mmol/L — ABNORMAL LOW (ref 22–32)
Calcium: 9.2 mg/dL (ref 8.9–10.3)
Chloride: 109 mmol/L (ref 98–111)
Creatinine, Ser: 0.99 mg/dL (ref 0.44–1.00)
GFR calc Af Amer: >60 mL/min (ref >60)
GFR calc non Af Amer: >60 mL/min (ref >60)
Glucose, Bld: 80 mg/dL (ref 70–99)
Potassium: 3.6 mmol/L (ref 3.5–5.1)
Sodium: 139 mmol/L (ref 135–145)

## 2019-08-29 LAB — CBC
HCT: 27.3 % — ABNORMAL LOW (ref 36.0–46.0)
Hemoglobin: 8.2 g/dL — ABNORMAL LOW (ref 12.0–15.0)
MCH: 21.6 pg — ABNORMAL LOW (ref 26.0–34.0)
MCHC: 30 g/dL (ref 30.0–36.0)
MCV: 72 fL — ABNORMAL LOW (ref 80.0–100.0)
Platelets: 403 10*3/uL — ABNORMAL HIGH (ref 150–400)
RBC: 3.79 MIL/uL — ABNORMAL LOW (ref 3.87–5.11)
RDW: 22.2 % — ABNORMAL HIGH (ref 11.5–15.5)
WBC: 10.9 10*3/uL — ABNORMAL HIGH (ref 4.0–10.5)
nRBC: 0 % (ref 0.0–0.2)

## 2019-08-29 NOTE — Telephone Encounter (Signed)
   Primary Cardiologist:Kenneth C Hilty, MD  Chart reviewed as part of pre-operative protocol coverage. Because of Anna Henson's past medical history and time since last visit, he/she will require a follow-up visit in order to better assess preoperative cardiovascular risk.  Pre-op covering staff: - Please schedule appointment and call patient to inform them. - Please contact requesting surgeon's office via preferred method (i.e, phone, fax) to inform them of need for appointment prior to surgery.  If applicable, this message will also be routed to pharmacy pool and/or primary cardiologist for input on holding anticoagulant/antiplatelet agent as requested below so that this information is available at time of patient's appointment.   Procedure may need to be delayed if unable to arrange sooner appt  Almyra Deforest, PA  08/29/2019, 6:15 PM

## 2019-08-29 NOTE — Progress Notes (Signed)
Quick review of pt's chart noted she has a history of CAD s/p PCI and DES.  Her primary cardiologist is Dr Debara Pickett, last  office visit was 08-19-2018, note in epic.  In this note was pt to follow up in 6 month for blood pressure and chest pain. Pt has not seen cardiologist since then or had telephone visit.  Reviewed chart w/ anesthesia , Konrad Felix PA, stated that pt would need clearance stated that she is stable to have surgery and also if pt stop ASA 81 mg if Dr Pamala Hurry wanted pt to stop. Called and left voice message for [place], or scheduler for dr Pamala Hurry, about pt needing clearance prior to surgery.  Gave her Dr Debara Pickett name and that he is at [facility].

## 2019-08-29 NOTE — Telephone Encounter (Signed)
° °  [facility] Pre-operative Risk Assessment    Request for surgical clearance:  1. What type of surgery is being performed? Hysteroscopy   When is this surgery scheduled? 09/02/19 2. What type of clearance is required (medical clearance vs. Pharmacy clearance to hold med vs. Both)? Medical Clearance  3. Are there any medications that need to be held prior to surgery and how long? Baby Asprin   4. Practice name and name of physician performing surgery? [facility] Dr. Aloha Gell  What is your office phone number [phone] ext [phone].   What is your office fax number [phone]  8.   Anesthesia type (None, local, MAC, general) ?Anesthesia   Shon Millet 08/29/2019, 3:25 PM  _________________________________________________________________   (provider comments below)

## 2019-08-30 LAB — ABO/RH: ABO/RH(D): B POS

## 2019-09-01 ENCOUNTER — Encounter: Payer: Self-pay | Admitting: Physician Assistant

## 2019-09-01 ENCOUNTER — Ambulatory Visit: Payer: 59 | Admitting: Physician Assistant

## 2019-09-01 ENCOUNTER — Encounter (HOSPITAL_BASED_OUTPATIENT_CLINIC_OR_DEPARTMENT_OTHER): Payer: Self-pay | Admitting: *Deleted

## 2019-09-01 ENCOUNTER — Other Ambulatory Visit: Payer: Self-pay

## 2019-09-01 VITALS — BP 124/72 | HR 80 | Temp 97.9°F | Ht 61.0 in | Wt 150.6 lb

## 2019-09-01 DIAGNOSIS — I2510 Atherosclerotic heart disease of native coronary artery without angina pectoris: Secondary | ICD-10-CM

## 2019-09-01 DIAGNOSIS — E785 Hyperlipidemia, unspecified: Secondary | ICD-10-CM

## 2019-09-01 DIAGNOSIS — I10 Essential (primary) hypertension: Secondary | ICD-10-CM

## 2019-09-01 DIAGNOSIS — Z01810 Encounter for preprocedural cardiovascular examination: Secondary | ICD-10-CM | POA: Diagnosis not present

## 2019-09-01 DIAGNOSIS — Z170 Estrogen receptor positive status [ER+]: Secondary | ICD-10-CM

## 2019-09-01 DIAGNOSIS — C50511 Malignant neoplasm of lower-outer quadrant of right female breast: Secondary | ICD-10-CM

## 2019-09-01 DIAGNOSIS — G373 Acute transverse myelitis in demyelinating disease of central nervous system: Secondary | ICD-10-CM

## 2019-09-01 NOTE — Progress Notes (Signed)
ADDENDUM:  Chart reviewed by anesthesia, Konrad Felix PA, ok to proceed.   Spoke w/ via phone for pre-op interview--- PT Lab needs dos----   Urine preg            Lab results------ CBC, BMP,T&S, EKG done 08-29-2019 w/ chart and epic. COVID test ------  08-29-2019 Arrive at ------- 0815 NPO after ------  Mn Medications to take morning of surgery ----- Hydralazine, Norvasc, Tamoxifen, Baclofen w/ sips of water Diabetic medication ----- n/a Patient Special Instructions ----- n/a Pre-Op special Istructions ----- cardiac clearance dated today w/ chart and epic  Patient verbalized understanding of instructions that were given at this phone interview. Patient denies shortness of breath, chest pain, fever, cough a this phone interview.   Anesthesia Review:  Hx CAD s/p  DES to [facility] 12/ 2015 and DES to proxRCA 03/ 2018 with NSTEMI.   Pt did received cardiac clearance today note in epic and chart.  Pt stated no cardiac s&s.  Per pt last nitro taken x1 was 01/ 2020 pain went away, cardiology adjusted bp medication.  No chest pain since.  PCP:  ? Cardiologist :  Dr Debara Pickett (lov 09-01-2019 in epic, w/ clearance by Almyra Deforest PA)   See previous progress note from 08-29-2019 I requested cardiac clearance with recommendation from [facility] PA since pt had not followed up since 08-19-2018.  Chest x-ray : 07-27-2018  epic EKG :  08-29-2019 epic Echo : 10-29-2017 epic Stress test:  05-07-2017 epic Cardiac Cath :  Last one 12-31-2016 epic Sleep Study/ CPAP :  NO  Blood Thinner/ Instructions Maryjane Hurter Dose:  NO ASA / Instructions/ Last Dose :  ASA 81mg /  Per pt last dose yesterday 08-31-2019

## 2019-09-01 NOTE — Progress Notes (Signed)
Cardiology Office Note:    Date:  09/02/2019   ID:  Anna Henson, DOB 1971-06-13, MRN [id number]  PCP:  Patient, No Pcp Per  Cardiologist:  Pixie Casino, MD  Electrophysiologist:  None   Referring MD: No ref. provider found   Chief Complaint  Patient presents with   Pre-op Exam    upcoming dilatation and curettage by Dr. Valentino Saxon    History of Present Illness:    Anna Henson is a 48 y.o. female with a hx of hypertension, hyperlipidemia, CKD stage III, CAD, breast cancer, transverse myelitis in 2017 and remote tobacco use.  Patient underwent DES to the mid RCA in 2015.  She had normal Myoview on 11/14/2016, however ended up having NSTEMI a month later.  She eventually underwent cardiac catheterization on 12/31/2016 and had DES to proximal RCA in March 2018 with mild LAD and the left circumflex residual.  EF was 65% by echocardiogram.  Last echocardiogram obtained on 07/29/2018 showed EF 65 to 70%, grade 1 DD, moderate LVH, mild LAE, otherwise no significant valve issue.  She was having chest pain in the setting of uncontrolled hypertension.  She did not wish to do a Lexiscan again.  Patient was placed on appropriate blood pressure therapy and was discharged.  Her last follow-up was with [name], PA-C on 08/19/2018.  Patient presents today for cardiology office visit.  Due to her leg weakness from transverse myelitis, she cannot walk very far before having to stop and rest.  She can probably walk about a block away from her house and back without any issue.  She denies any recent chest discomfort.  Recent EKG has been reviewed which did not show any obvious ST-T wave changes.  She had a false negative stress test in 2018, repeating a stress test likely will not decrease her overall surgical risk.  Since she is quite stable from a heart perspective, I recommended continue on the current medication and proceed with the surgery.  After the surgery, I would recommend she discontinue her  hydralazine and switch to carvedilol 6.25 mg BID. Even though carvedilol is listed on her medication list, she says she ran out of medication a long time ago and has not been taking it.  Compared to the hydralazine, carvedilol offer her more benefit as it decreases cardiac workload.  She can follow-up in 6 months for blood pressure check.   Past Medical History:  Diagnosis Date   Anemia    Anxiety    Bipolar disorder in full remission ([place])    CAD in native artery cardiologist--  dr hilty   a. 09/2014 Cath/PCI in setting of [place] - s/p 2.25 x 12 mm Promus Premier DES to mid RCA;  b. 11/2016 Myoview: EF 56%, no ischemia;  c. 12/2016 NSTEMI/PCI: LM nl, LAd 25p, LCX 30p, RCA 100p (2.75x38 Promus Premier DES), mRCA 10 ISR, EF 50-55%.   CKD (chronic kidney disease), stage III    Depression    Genetic testing 04/23/2017   [name] underwent genetic counseling and testing for hereditary cancer syndromes on 04/05/2017. Her results were negative for mutations in all 46 genes analyzed by [facility]'s 46-gene Common Hereditary Cancers Panel. Genes analyzed include: APC, ATM, AXIN2, BARD1, BMPR1A, BRCA1, BRCA2, BRIP1, CDH1, CDKN2A, CHEK2, CTNNA1, DICER1, EPCAM, GREM1, HOXB13, KIT, MEN1, MLH1, MSH2, MSH3, MSH6, MUTYH, NBN,   GERD (gastroesophageal reflux disease)    Headache    History of external beam radiation therapy    right breast 07-27-2017  to 09-25-2017   History of non-ST elevation myocardial infarction (NSTEMI)    HOCM (hypertrophic obstructive cardiomyopathy) ([place]) followed by cardiology   a. 12/2016 Echo: EF 65-70%,  mod conc LVH, dynamic obstruction @ rest, peak velocity of 291 cm/sec w/ peak gradient of 90mHg, no rwma, Gr1 DD, triv TR, PASP 128mg.   Hyperlipidemia    Hypertension    Malignant neoplasm of lower-outer quadrant of right breast of female, estrogen receptor positive [facility] dr guLindi Adie dx 06/ 2018--- right breast invasive lobular carcinoma, ductal carcinoma  w/ LCIS---- 06-21-2017 s/p right breast lumpecotmy w/ sln disseciton;   completed radiation 09-25-2017;  on tamoxifen   S/P drug eluting coronary stent placement    09-14-2014---  PCI with DES x1 to midRCA;  12-31-2016  PCI with DES x1 to proxRCA   [facility] dr saFelecia Shelling 01/ 2017  dx transverse myelitis w/ right side numbness (09-01-2019  currently lower extremity weakness, mucsle spasms, and gait disturbance)   Wears glasses     Past Surgical History:  Procedure Laterality Date   BREAST LUMPECTOMY WITH RADIOACTIVE SEED AND SENTINEL LYMPH NODE BIOPSY Right 06/21/2017   Procedure: RIGHT BREAST BRACKETED SEED GUIDED LUMPECTOMY AND SENTINEL LYMPH NODE BIOPSY;  Surgeon: ToJovita KussmaulMD;  [facility]: General;  Laterality: Right;   CORONARY/GRAFT ACUTE MI REVASCULARIZATION N/A 12/31/2016   Procedure: Coronary/Graft Acute MI Revascularization;  Surgeon: MiSherren MochaMD;  Location: [facility];  Service: Cardiovascular;  Laterality: N/A;   LEFT HEART CATH AND CORONARY ANGIOGRAPHY N/A 12/31/2016   Procedure: Left Heart Cath and Coronary Angiography;  Surgeon: MiSherren MochaMD;  Location: [facility];  Service: Cardiovascular;  Laterality: N/A;   LEFT HEART CATHETERIZATION WITH CORONARY ANGIOGRAM N/A 09/14/2014   Procedure: LEFT HEART CATHETERIZATION WITH CORONARY ANGIOGRAM;  Surgeon: ChBurnell BlanksMD;  [facility];  Service: Cardiovascular;  Laterality: N/A;   PERCUTANEOUS CORONARY STENT INTERVENTION (PCI-S)  09/14/2014   Procedure: PERCUTANEOUS CORONARY STENT INTERVENTION (PCI-S);  Surgeon: ChBurnell BlanksMD;  [facility];  Service: Cardiovascular;;   TONSILLECTOMY  [id number] UMBILICAL HERNIA REPAIR  2001; 2005    Current Medications: Current Meds  Medication Sig   acetaminophen (TYLENOL) 325 MG tablet Take 325-650 mg by mouth every 8 (eight) hours as needed (for pain or cramping).     amLODipine (NORVASC) 10 MG tablet TAKE 1 TABLET(10 MG) BY MOUTH DAILY (Patient taking differently: Take 10 mg by mouth daily. )   aspirin 81 MG chewable tablet Chew 1 tablet (81 mg total) by mouth daily.   atorvastatin (LIPITOR) 80 MG tablet TAKE 1 TABLET(80 MG) BY MOUTH DAILY (Patient taking differently: Take 80 mg by mouth at bedtime. )   baclofen (LIORESAL) 10 MG tablet Take up to 4/day for muscle spasms (Patient taking differently: Take 10 mg by mouth 4 (four) times daily. Take up to 4/day for muscle spasms)   hydrALAZINE (APRESOLINE) 25 MG tablet TAKE 1 TABLET BY MOUTH THREE TIMES DAILY (Patient taking differently: Take 25 mg by mouth 3 (three) times daily. )   lisinopril (ZESTRIL) 20 MG tablet TAKE 1 TABLET(20 MG) BY MOUTH TWICE DAILY (Patient taking differently: Take 20 mg by mouth 2 (two) times daily. )   tamoxifen (NOLVADEX) 20 MG tablet Take 1 tablet (20 mg total) by mouth daily. (Patient taking differently: Take 20 mg by mouth daily. )   traMADol (ULTRAM) 50 MG tablet  Take 1 tablet (50 mg total) by mouth 2 (two) times daily as needed.     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    Employer: [employer] resource strain: Somewhat hard   Food insecurity    Worry: Often true    Inability: Often true   Transportation needs    Medical: No    Non-medical: No  Tobacco Use   Smoking status: Former Smoker    Packs/day: 0.50    Years: 30.00    Pack years: 15.00    Types: Cigarettes    Quit date: 10/05/2015    Years since quitting: 3.9   Smokeless tobacco: Never Used  Substance and Sexual Activity   Alcohol use: Yes    Alcohol/week: 2.0 standard drinks    Types: 2 Cans of beer per week     Drug use: No   Sexual activity: Not Currently  Lifestyle   Physical activity    Days per week: 1 day    Minutes per session: 10 min   Stress: Very much  Relationships   Social connections    Talks on phone: More than three times a week    Gets together: Once a week    Attends religious service: 1 to 4 times per year    Active member of club or organization: No    Attends meetings of clubs or organizations: Never    Relationship status: Never married  Other Topics Concern   Not on file  Social History Narrative   Consumes 2 cups of caffeine daily     Family History: The patient's family history includes Bone cancer (age of onset: 31) in her father; Brain cancer (age of onset: 53) in her father; Diabetes in her mother; Drug abuse in her father and sister; Heart disease in her mother; Hyperlipidemia in her mother; Hypertension in her mother; Lung cancer in her father; Mental illness in her brother and maternal grandmother; Stomach cancer (age of onset: 59) in her paternal grandmother.  ROS:   Please see the history of present illness.     All other systems reviewed and are negative.  EKGs/Labs/Other Studies Reviewed:    The following studies were reviewed today:  Echo 07/29/2018 LV EF: 65% -   70%  ------------------------------------------------------------------- Indications:      Chest pain 786.51.  ------------------------------------------------------------------- History:   PMH:  Chronic Kidney Disease.  Coronary artery disease. PMH:   Myocardial infarction.  Risk factors:  Current tobacco use. Hypertension. Dyslipidemia.  ------------------------------------------------------------------- Study Conclusions  - Left ventricle: The cavity size was normal. Wall thickness was   increased in a pattern of moderate LVH. Systolic function was   vigorous. The estimated ejection fraction was in the range of 65%   to 70%. Grossly normal longitudinal LV strain at  -18%. Wall   motion was normal; there were no regional wall motion   abnormalities. Doppler parameters are consistent with abnormal   left ventricular relaxation (grade 1 diastolic dysfunction). The   E/e&' ratio is between 8-15, suggesting indeterminate LV filling   pressure. - Aortic valve: Sclerosis without stenosis. There was no   regurgitation. - Mitral valve: Mildly thickened leaflets .  There was trivial   regurgitation. - Left atrium: The atrium was mildly dilated. - Inferior vena cava: The vessel was normal in size. The   respirophasic diameter changes were in the normal range (>= 50%),   consistent with normal central venous pressure.  Impressions:  - Compared to a prior study in 09/2017, there is now moderate LVH   with an increased LVEF of 65-70%, grade 1 DD and indeterminate LV   filling pressure.  EKG:  EKG is not ordered today.  EKG obtained recently on 08/29/2019 has been reviewed which showed no significant ST-T wave changes, normal sinus rhythm.  Recent Labs: 08/29/2019: BUN 9; Creatinine, Ser 0.99; Hemoglobin 8.2; Platelets 403; Potassium 3.6; Sodium 139  Recent Lipid Panel    Component Value Date/Time   CHOL 149 07/09/2018 1509   TRIG 99 07/09/2018 1509   HDL 42 07/09/2018 1509   CHOLHDL 3.5 07/09/2018 1509   CHOLHDL 3.9 12/31/2016 1036   VLDL 20 12/31/2016 1036   LDLCALC 87 07/09/2018 1509    Physical Exam:    VS:  BP 124/72    Pulse 80    Temp 97.9 F (36.6 C)    Ht 5' 1" (1.549 m)    Wt 150 lb 9.6 oz (68.3 kg)    LMP  (LMP Unknown)    SpO2 99%    BMI 28.46 kg/m     Wt Readings from Last 3 Encounters:  09/01/19 150 lb 9.6 oz (68.3 kg)  05/05/19 153 lb 11.2 oz (69.7 kg)  10/14/18 164 lb 6.4 oz (74.6 kg)     GEN:  Well nourished, well developed in no acute distress HEENT: Normal NECK: No JVD; No carotid bruits LYMPHATICS: No lymphadenopathy CARDIAC: RRR, no murmurs, rubs, gallops RESPIRATORY:  Clear to auscultation without rales, wheezing or  rhonchi  ABDOMEN: Soft, non-tender, non-distended MUSCULOSKELETAL:  No edema; No deformity  SKIN: Warm and dry NEUROLOGIC:  Alert and oriented x 3 PSYCHIATRIC:  Normal affect   ASSESSMENT:    1. Preop cardiovascular exam   2. Coronary artery disease involving native coronary artery of native heart without angina pectoris   3. Essential hypertension   4. Hyperlipidemia LDL goal <70   5. Malignant neoplasm of lower-outer quadrant of right breast of female, estrogen receptor positive ([place])   6. Transverse myelitis ([place])    PLAN:    In order of problems listed above:  1. Preoperative cardiac clearance: Patient has upcoming hysteroscopy with dilatation and curettage.  Patient denies any recent chest discomfort or shortness of breath.  She is unable to accomplish 4 METS activity mainly due to weakness as result of previous transverse myelitis.  EKG obtained on 08/29/2019 has been reviewed, there was no significant ischemic changes.  Patient had a negative Myoview a month prior to her heart attack in 2018.  Repeat a Myoview review at this time likely will not decrease her overall risk.  Her RCRI risk is class II, 0.9% of major cardiac event during perioperative phase.  2. CAD: She may hold aspirin.  We typically recommend holding aspirin for 5 to 7 days prior to surgery, however she says her last dose of aspirin was this morning.  I advised her to check with her surgeon.  She will need to hold aspirin at least for tomorrow morning.   3. Hypertension: Blood pressure well controlled on current therapy.  For some reason, her carvedilol has been discontinued.  I did not recommend changing any medication prior to her surgery,  however after her surgery, I would recommend discontinuing hydralazine and restart carvedilol at 6.25 mg twice daily as it offers her better benefit given her history of coronary artery disease  4. Hyperlipidemia: Continue statin therapy  5. Breast cancer: Followed by oncology  service  6. Transverse myelitis: Followed by neurology.   Medication Adjustments/Labs and Tests Ordered: Current medicines are reviewed at length with the patient today.  Concerns regarding medicines are outlined above.  No orders of the defined types were placed in this encounter.  No orders of the defined types were placed in this encounter.   Patient Instructions  Medication Instructions:   Your physician recommends that you continue on your current medications as directed. Please refer to the Current Medication list given to you today.  *If you need a refill on your cardiac medications before your next appointment, please call your pharmacy*  Lab Work:  NONE ordered at this time of appointment   If you have labs (blood work) drawn today and your tests are completely normal, you will receive your results only by:  [place] (if you have MyChart) OR  A paper copy in the mail If you have any lab test that is abnormal or we need to change your treatment, we will call you to review the results.  Testing/Procedures:  NONE ordered at this time of appointment   Follow-Up: At [facility], you and your health needs are our priority.  As part of our continuing mission to provide you with exceptional heart care, we have created designated Provider Care Teams.  These Care Teams include your primary Cardiologist (physician) and Advanced Practice Providers (APPs -  Physician Assistants and Nurse Practitioners) who all work together to provide you with the care you need, when you need it.  Your next appointment:   6 month(s)  The format for your next appointment:   In Person  Provider:   K. Mali Hilty, MD  Other Instructions  You are CLEARED for Surgery  You will need to discontinue Hydralazine after your surgery and switch to Carvedilol (Coreg) 6.25 mg 2 times a day       Hilbert Corrigan, [place]  09/02/2019 12:41 AM    [place]

## 2019-09-01 NOTE — Patient Instructions (Signed)
Medication Instructions:   Your physician recommends that you continue on your current medications as directed. Please refer to the Current Medication list given to you today.  *If you need a refill on your cardiac medications before your next appointment, please call your pharmacy*  Lab Work:  NONE ordered at this time of appointment   If you have labs (blood work) drawn today and your tests are completely normal, you will receive your results only by: [place] MyChart Message (if you have MyChart) OR . A paper copy in the mail If you have any lab test that is abnormal or we need to change your treatment, we will call you to review the results.  Testing/Procedures:  NONE ordered at this time of appointment   Follow-Up: At [facility], you and your health needs are our priority.  As part of our continuing mission to provide you with exceptional heart care, we have created designated Provider Care Teams.  These Care Teams include your primary Cardiologist (physician) and Advanced Practice Providers (APPs -  Physician Assistants and Nurse Practitioners) who all work together to provide you with the care you need, when you need it.  Your next appointment:   6 month(s)  The format for your next appointment:   In Person  Provider:   K. Mali Hilty, MD  Other Instructions  You are CLEARED for Surgery  You will need to discontinue Hydralazine after your surgery and switch to Carvedilol (Coreg) 6.25 mg 2 times a day

## 2019-09-01 NOTE — Telephone Encounter (Signed)
Manually faxed the clearance to the office of [place]

## 2019-09-01 NOTE — Telephone Encounter (Addendum)
   Primary Cardiologist: Pixie Casino, MD  I have seen the patient in the clinic today as part of cardiac clearance, she denies any chest pain or shortness of breath.  She is stable from cardiology perspective.  A recent EKG has been reviewed which showed no sign of ischemic changes.  Her RCRI risk is class II, 0.9% risk of major cardiac event.  She previously had a false negative stress test prior to her heart attack in 2018, at this time, I would not recommend any further cardiovascular testing given lack of symptom.  Typically, aspirin need to be held for 5 to 7 days if surgeon feels it absolutely need to be stopped prior to the surgery, she says she was not told by her surgeon regarding holding the aspirin.  Her surgery is tomorrow, I instructed her to hold aspirin tomorrow morning.  Please call with questions.  [place], [place] 09/01/2019, 2:46 PM

## 2019-09-01 NOTE — Telephone Encounter (Signed)
Pt has been scheduled to see Almyra Deforest, PA for surgery clearance appt today. Pt is set for surgery 09/02/19. I will route clearance form to PA for appt today. I will remove from the pre op call back pool .

## 2019-09-02 ENCOUNTER — Ambulatory Visit (HOSPITAL_BASED_OUTPATIENT_CLINIC_OR_DEPARTMENT_OTHER): Payer: 59 | Admitting: Anesthesiology

## 2019-09-02 ENCOUNTER — Ambulatory Visit (HOSPITAL_BASED_OUTPATIENT_CLINIC_OR_DEPARTMENT_OTHER)
Admission: RE | Admit: 2019-09-02 | Discharge: 2019-09-02 | Disposition: A | Discharge status: Home / Self Care | Payer: 59 | Attending: Obstetrics | Admitting: Obstetrics

## 2019-09-02 ENCOUNTER — Encounter: Payer: Self-pay | Admitting: Physician Assistant

## 2019-09-02 ENCOUNTER — Ambulatory Visit (HOSPITAL_BASED_OUTPATIENT_CLINIC_OR_DEPARTMENT_OTHER): Payer: 59 | Admitting: Physician Assistant

## 2019-09-02 ENCOUNTER — Other Ambulatory Visit: Payer: Self-pay

## 2019-09-02 ENCOUNTER — Encounter (HOSPITAL_BASED_OUTPATIENT_CLINIC_OR_DEPARTMENT_OTHER): Payer: Self-pay | Admitting: Certified Registered"

## 2019-09-02 ENCOUNTER — Encounter (HOSPITAL_BASED_OUTPATIENT_CLINIC_OR_DEPARTMENT_OTHER)
Admission: RE | Disposition: A | Discharge status: Home / Self Care | Payer: Self-pay | Source: Home / Self Care | Attending: Obstetrics

## 2019-09-02 ENCOUNTER — Other Ambulatory Visit (HOSPITAL_COMMUNITY)
Admission: RE | Admit: 2019-09-02 | Discharge: 2019-09-02 | Disposition: A | Discharge status: Home / Self Care | Payer: 59 | Source: Ambulatory Visit | Attending: Obstetrics | Admitting: Obstetrics

## 2019-09-02 ENCOUNTER — Other Ambulatory Visit: Payer: Self-pay | Admitting: Obstetrics

## 2019-09-02 DIAGNOSIS — K219 Gastro-esophageal reflux disease without esophagitis: Secondary | ICD-10-CM | POA: Insufficient documentation

## 2019-09-02 DIAGNOSIS — R509 Fever, unspecified: Secondary | ICD-10-CM | POA: Diagnosis not present

## 2019-09-02 DIAGNOSIS — Z955 Presence of coronary angioplasty implant and graft: Secondary | ICD-10-CM | POA: Insufficient documentation

## 2019-09-02 DIAGNOSIS — I252 Old myocardial infarction: Secondary | ICD-10-CM | POA: Diagnosis not present

## 2019-09-02 DIAGNOSIS — Z79810 Long term (current) use of selective estrogen receptor modulators (SERMs): Secondary | ICD-10-CM | POA: Diagnosis not present

## 2019-09-02 DIAGNOSIS — I2510 Atherosclerotic heart disease of native coronary artery without angina pectoris: Secondary | ICD-10-CM | POA: Insufficient documentation

## 2019-09-02 DIAGNOSIS — I421 Obstructive hypertrophic cardiomyopathy: Secondary | ICD-10-CM | POA: Diagnosis not present

## 2019-09-02 DIAGNOSIS — E785 Hyperlipidemia, unspecified: Secondary | ICD-10-CM | POA: Diagnosis not present

## 2019-09-02 DIAGNOSIS — Z87891 Personal history of nicotine dependence: Secondary | ICD-10-CM | POA: Diagnosis not present

## 2019-09-02 DIAGNOSIS — I129 Hypertensive chronic kidney disease with stage 1 through stage 4 chronic kidney disease, or unspecified chronic kidney disease: Secondary | ICD-10-CM | POA: Insufficient documentation

## 2019-09-02 DIAGNOSIS — Z20828 Contact with and (suspected) exposure to other viral communicable diseases: Secondary | ICD-10-CM | POA: Insufficient documentation

## 2019-09-02 DIAGNOSIS — D250 Submucous leiomyoma of uterus: Secondary | ICD-10-CM

## 2019-09-02 DIAGNOSIS — N183 Chronic kidney disease, stage 3 unspecified: Secondary | ICD-10-CM | POA: Insufficient documentation

## 2019-09-02 DIAGNOSIS — Z853 Personal history of malignant neoplasm of breast: Secondary | ICD-10-CM | POA: Diagnosis not present

## 2019-09-02 DIAGNOSIS — N920 Excessive and frequent menstruation with regular cycle: Secondary | ICD-10-CM | POA: Insufficient documentation

## 2019-09-02 HISTORY — DX: Presence of spectacles and contact lenses: Z97.3

## 2019-09-02 HISTORY — DX: Malignant neoplasm of lower-outer quadrant of right female breast: C50.511

## 2019-09-02 HISTORY — DX: Old myocardial infarction: I25.2

## 2019-09-02 HISTORY — DX: Bipolar disorder, currently in remission, most recent episode unspecified: F31.70

## 2019-09-02 HISTORY — DX: Anemia, unspecified: D64.9

## 2019-09-02 HISTORY — DX: Chronic kidney disease, stage 3 unspecified: N18.30

## 2019-09-02 HISTORY — DX: Estrogen receptor positive status (ER+): Z17.0

## 2019-09-02 HISTORY — DX: Presence of coronary angioplasty implant and graft: Z95.5

## 2019-09-02 HISTORY — PX: DILATATION & CURETTAGE/HYSTEROSCOPY WITH MYOSURE: SHX6511

## 2019-09-02 HISTORY — DX: Personal history of irradiation: Z92.3

## 2019-09-02 LAB — TYPE AND SCREEN
ABO/RH(D): B POS
Antibody Screen: NEGATIVE

## 2019-09-02 LAB — NOVEL CORONAVIRUS, NAA (HOSP ORDER, SEND-OUT TO REF LAB; TAT 18-24 HRS): SARS-CoV-2, NAA: NOT DETECTED

## 2019-09-02 LAB — SARS CORONAVIRUS 2 BY RT PCR (HOSPITAL ORDER, PERFORMED IN ~~LOC~~ HOSPITAL LAB): SARS Coronavirus 2: NEGATIVE

## 2019-09-02 SURGERY — DILATATION & CURETTAGE/HYSTEROSCOPY WITH MYOSURE
Anesthesia: General | Site: Vagina

## 2019-09-02 MED ORDER — FENTANYL CITRATE (PF) 100 MCG/2ML IJ SOLN
INTRAMUSCULAR | Status: AC
  Filled 2019-09-02: qty 2

## 2019-09-02 MED ORDER — PROPOFOL 10 MG/ML IV BOLUS
INTRAVENOUS | Status: DC | PRN
  Administered 2019-09-02: 160 mg via INTRAVENOUS

## 2019-09-02 MED ORDER — BUPIVACAINE HCL 0.5 % IJ SOLN
INTRAMUSCULAR | Status: DC | PRN
  Administered 2019-09-02: 10 mL

## 2019-09-02 MED ORDER — ONDANSETRON HCL 4 MG/2ML IJ SOLN
INTRAMUSCULAR | Status: DC | PRN
  Administered 2019-09-02: 4 mg via INTRAVENOUS

## 2019-09-02 MED ORDER — DEXAMETHASONE SODIUM PHOSPHATE 10 MG/ML IJ SOLN
INTRAMUSCULAR | Status: DC | PRN
  Administered 2019-09-02: 10 mg via INTRAVENOUS

## 2019-09-02 MED ORDER — PROMETHAZINE HCL 25 MG/ML IJ SOLN
6.2500 mg | INTRAMUSCULAR | Status: DC | PRN
  Filled 2019-09-02: qty 1

## 2019-09-02 MED ORDER — LIDOCAINE 2% (20 MG/ML) 5 ML SYRINGE
INTRAMUSCULAR | Status: DC | PRN
  Administered 2019-09-02: 80 mg via INTRAVENOUS

## 2019-09-02 MED ORDER — LACTATED RINGERS IV SOLN
INTRAVENOUS | Status: DC
  Administered 2019-09-02: 12:00:00 via INTRAVENOUS
  Filled 2019-09-02: qty 1000

## 2019-09-02 MED ORDER — OXYCODONE HCL 5 MG PO TABS
ORAL_TABLET | ORAL | Status: AC
  Filled 2019-09-02: qty 1

## 2019-09-02 MED ORDER — FENTANYL CITRATE (PF) 100 MCG/2ML IJ SOLN
INTRAMUSCULAR | Status: DC | PRN
  Administered 2019-09-02: 25 ug via INTRAVENOUS
  Administered 2019-09-02: 50 ug via INTRAVENOUS
  Administered 2019-09-02: 25 ug via INTRAVENOUS

## 2019-09-02 MED ORDER — PHENYLEPHRINE 40 MCG/ML (10ML) SYRINGE FOR IV PUSH (FOR BLOOD PRESSURE SUPPORT)
PREFILLED_SYRINGE | INTRAVENOUS | Status: AC
  Filled 2019-09-02: qty 10

## 2019-09-02 MED ORDER — SODIUM CHLORIDE 0.9 % IR SOLN
Status: DC | PRN
  Administered 2019-09-02: 9000 mL

## 2019-09-02 MED ORDER — MIDAZOLAM HCL 2 MG/2ML IJ SOLN
INTRAMUSCULAR | Status: AC
  Filled 2019-09-02: qty 2

## 2019-09-02 MED ORDER — PHENYLEPHRINE 40 MCG/ML (10ML) SYRINGE FOR IV PUSH (FOR BLOOD PRESSURE SUPPORT)
PREFILLED_SYRINGE | INTRAVENOUS | Status: DC | PRN
  Administered 2019-09-02 (×2): 80 ug via INTRAVENOUS

## 2019-09-02 MED ORDER — OXYCODONE HCL 5 MG PO TABS
5.0000 mg | ORAL_TABLET | Freq: Once | ORAL | Status: AC
  Administered 2019-09-02: 15:00:00 5 mg via ORAL
  Filled 2019-09-02: qty 1

## 2019-09-02 MED ORDER — SODIUM CHLORIDE 0.9 % IV SOLN
100.0000 mg | Freq: Once | INTRAVENOUS | Status: AC
  Administered 2019-09-02: 100 mg via INTRAVENOUS
  Filled 2019-09-02 (×2): qty 100

## 2019-09-02 MED ORDER — OXYCODONE-ACETAMINOPHEN 5-325 MG PO TABS: 1.0000 | ORAL_TABLET | ORAL | 0 refills | Status: AC | PRN

## 2019-09-02 MED ORDER — MIDAZOLAM HCL 5 MG/5ML IJ SOLN
INTRAMUSCULAR | Status: DC | PRN
  Administered 2019-09-02: 2 mg via INTRAVENOUS

## 2019-09-02 MED ORDER — VASOPRESSIN 20 UNIT/ML IV SOLN
INTRAVENOUS | Status: DC | PRN
  Administered 2019-09-02: 6 [IU]

## 2019-09-02 MED ORDER — IBUPROFEN 800 MG PO TABS: 800.0000 mg | ORAL_TABLET | Freq: Three times a day (TID) | ORAL | 1 refills | Status: DC | PRN

## 2019-09-02 MED ORDER — FENTANYL CITRATE (PF) 100 MCG/2ML IJ SOLN
25.0000 ug | INTRAMUSCULAR | Status: DC | PRN
  Filled 2019-09-02: qty 1

## 2019-09-02 MED ORDER — PROPOFOL 10 MG/ML IV BOLUS
INTRAVENOUS | Status: AC
  Filled 2019-09-02: qty 20

## 2019-09-02 MED FILL — OXYCODONE-ACETAMINOPHEN 5-3: 5-325 | 2 days supply | Qty: 10 | Fill #0

## 2019-09-02 MED FILL — IBUPROFEN 800 MG TAB: 800 | 10 days supply | Qty: 30 | Fill #0

## 2019-09-02 SURGICAL SUPPLY — 24 items
CATH ROBINSON RED A/P 14FR (CATHETERS) ×2 IMPLANT
CATH ROBINSON RED A/P 16FR (CATHETERS) ×2 IMPLANT
DEVICE MYOSURE LITE (MISCELLANEOUS) IMPLANT
DEVICE MYOSURE REACH (MISCELLANEOUS) IMPLANT
GLOVE BIO SURGEON STRL SZ 6.5 (GLOVE) ×2 IMPLANT
GLOVE BIOGEL PI IND STRL 7.0 (GLOVE) ×2 IMPLANT
GLOVE BIOGEL PI INDICATOR 7.0 (GLOVE) ×2
GOWN STRL REUS W/TWL LRG LVL3 (GOWN DISPOSABLE) ×2 IMPLANT
KIT PROCEDURE FLUENT (KITS) ×2 IMPLANT
KIT TURNOVER CYSTO (KITS) ×2 IMPLANT
MYOSURE XL FIBROID (MISCELLANEOUS) ×2
NDL SAFETY ECLIPSE 18X1.5 (NEEDLE) ×1 IMPLANT
NEEDLE HYPO 18GX1.5 SHARP (NEEDLE) ×1
NEEDLE SPNL 22GX3.5 QUINCKE BK (NEEDLE) ×2 IMPLANT
PACK VAGINAL MINOR WOMEN LF (CUSTOM PROCEDURE TRAY) ×2 IMPLANT
PAD OB MATERNITY 4.3X12.25 (PERSONAL CARE ITEMS) ×2 IMPLANT
SEAL CERVICAL OMNI LOK (ABLATOR) IMPLANT
SEAL ROD LENS SCOPE MYOSURE (ABLATOR) ×2 IMPLANT
SYR 10ML LL (SYRINGE) ×2 IMPLANT
SYR 3ML 23GX1 SAFETY (SYRINGE) ×2 IMPLANT
SYR CONTROL 10ML LL (SYRINGE) ×2 IMPLANT
SYR TB 1ML LL NO SAFETY (SYRINGE) ×2 IMPLANT
SYSTEM TISS REMOVAL MYOSURE XL (MISCELLANEOUS) ×1 IMPLANT
TOWEL OR 17X26 10 PK STRL BLUE (TOWEL DISPOSABLE) ×2 IMPLANT

## 2019-09-02 NOTE — Anesthesia Procedure Notes (Signed)
Procedure Name: LMA Insertion Date/Time: 09/02/2019 12:11 PM Performed by: Hanna Ra D, CRNA Pre-anesthesia Checklist: Patient identified, Emergency Drugs available, Suction available and Patient being monitored Patient Re-evaluated:Patient Re-evaluated prior to induction Oxygen Delivery Method: Circle system utilized Preoxygenation: Pre-oxygenation with 100% oxygen Induction Type: IV induction Ventilation: Mask ventilation without difficulty LMA: LMA inserted LMA Size: 4.0 Tube type: Oral Number of attempts: 1 Airway Equipment and Method: Stylet Placement Confirmation: positive ETCO2 and breath sounds checked- equal and bilateral Tube secured with: Tape Dental Injury: Teeth and Oropharynx as per pre-operative assessment

## 2019-09-02 NOTE — H&P (Signed)
CC: pelvic pain, intracavitary fibroid  HPI: 48 yo non-pregnant pt with cramping pelvic pain found to have intracavitary 3.3cm fibroid stemming from posterior uterine wall. Episode of oligomenorrhea led to post-Provera u/s which showed uterus 9x6x5 cm with 69m EMS, f/u sis showed 3.3 cm cavitary fibroid with otherwise thin EMS. Since pre-op cytotec pt in extreme pain requiring OTC pain meds and office paracervical block. Delay in getting to OR due to lab error in processing Covid test.   Pt with h/o MI and stent x 2, medical clearance obtained from cardiologist. Breast CA pt on tamoxifen. 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   Recent BV 11/20, treated with tindamax  Past Medical History:  Diagnosis Date  . Anemia   . Anxiety   . [place]   . CAD in native artery cardiologist--  dr hilty   a. 09[place]p 2.25 x 12 mm Promus Premier DES to mid RCA;  b. 11/2016 Myoview: EF 56%, no ischemia;  c. 12/2016 NSTEMI/PCI: LM nl, LAd 25p, LCX 30p, RCA 100p (2.75x38 Promus Premier DES), mRCA 10 ISR, EF 50-55%.  . CKD (chronic kidney disease), stage III   . Depression   . Genetic testing 04/23/2017   Ms. Paulsen underwent genetic counseling and testing for hereditary cancer syndromes on 04/05/2017. Her results were negative for mutations in all 46 genes analyzed by [facility]'s 46-gene Common Hereditary Cancers Panel. Genes analyzed include: APC, ATM, AXIN2, BARD1, BMPR1A, BRCA1, BRCA2, BRIP1, CDH1, CDKN2A, CHEK2, CTNNA1, DICER1, EPCAM, GREM1, HOXB13, KIT, MEN1, MLH1, MSH2, MSH3, MSH6, MUTYH, NBN,  . GERD (gastroesophageal reflux disease)   . Headache   . History of external beam radiation therapy    right breast 07-27-2017  to 09-25-2017  . History of non-ST elevation myocardial infarction (NSTEMI)   . H[place]rophic [place]) [place]   a. 12/2016 Echo: EF 65-70%,  mod conc LVH, dynamic obstruction @ rest, peak  velocity of 291 cm/sec w/ peak gradient of 358mg, no rwma, Gr1 DD, triv TR, PASP 1511m.  . HMarland Kitchenperlipidemia   . Hypertension   . Malignant neoplasm of lower-outer quadrant of right breast of female, estrogen receptor positive ([facility] dr gudLindi Adiedx 06/ 2018--- right breast invasive lobular carcinoma, ductal carcinoma w/ LCIS---- 06-21-2017 s/p right breast lumpecotmy w/ sln disseciton;   completed radiation 09-25-2017;  on tamoxifen  . S/P drug eluting coronary stent placement    09-14-2014---  [facility]  PCI with DES x1 to proxRCA  . [facility] dr satFelecia Shelling01/ 2017  dx transverse myelitis w/ right side numbness (09-01-2019  currently lower extremity weakness, mucsle spasms, and gait disturbance)  . Wears glasses     Past Surgical History:  Procedure Laterality Date  . BREAST LUMPECTOMY WITH RADIOACTIVE SEED AND SENTINEL LYMPH NODE BIOPSY Right 06/21/2017   Procedure: RIGHT BREAST BRACKETED SEED GUIDED LUMPECTOMY AND SENTINEL LYMPH NODE BIOPSY;  Surgeon: TotJovita KussmaulD;  [facility];  Laterality: Right;  . CORONARY/GRAFT ACUTE MI REVASCULARIZATION N/A 12/31/2016   Procedure: Coronary/Graft Acute MI Revascularization;  Surgeon: MicSherren MochaD;  [facility] [place] [facility];  Service: Cardiovascular;  Laterality: N/A;  . LEFT HEART CATH AND CORONARY ANGIOGRAPHY N/A 12/31/2016   Procedure: Left Heart Cath and Coronary Angiography;  Surgeon: MicSherren MochaD;  [facility] [place] [facility];  Service: Cardiovascular;  Laterality: N/A;  . LEFT HEART CATHETERIZATION WITH CORONARY ANGIOGRAM N/A 09/14/2014   Procedure: LEFT HEART CATHETERIZATION WITH CORONARY ANGIOGRAM;  Surgeon: Burnell Blanks, MD;  [facility] LAB;  Service: Cardiovascular;  Laterality: N/A;  . PERCUTANEOUS CORONARY STENT INTERVENTION (PCI-S)  09/14/2014   Procedure: PERCUTANEOUS CORONARY STENT INTERVENTION (PCI-S);  Surgeon:  Burnell Blanks, MD;  Location: [facility] CATH LAB;  Service: Cardiovascular;;  . TONSILLECTOMY  2005  . UMBILICAL HERNIA REPAIR  2001; 2005    NKDA  PE: Vitals:   09/02/19 1142  BP: (!) 141/79  Pulse: 88  Resp: 16  Temp: 100.2 F (37.9 C)  SpO2: 100%   Abd: soft, NT, ND LE: Nt, no edema GU: def to OR  Gen: uncomfortable   A/P: Severe pain from intracavitary fibroid. Proceed with hysteroscopic myomectomy. R/B d/w pt.  Fever- mild, likely from cytotec, will add doxy  Ala Dach 09/02/2019 12:00 PM

## 2019-09-02 NOTE — Transfer of Care (Signed)
Immediate Anesthesia Transfer of Care Note  Patient: Anna Henson  Procedure(s) Performed: DILATATION & CURETTAGE/HYSTEROSCOPY WITH MYOSURE (N/A Vagina )  Patient Location: PACU  Anesthesia Type:General  Level of Consciousness: awake, alert  and oriented  Airway & Oxygen Therapy: Patient Spontanous Breathing and Patient connected to nasal cannula oxygen  Post-op Assessment: Report given to RN and Post -op Vital signs reviewed and stable  Post vital signs: Reviewed and stable  Last Vitals:  Vitals Value Taken Time  BP 146/87 09/02/19 1327  Temp 37.7 C 09/02/19 1327  Pulse 82 09/02/19 1329  Resp 18 09/02/19 1329  SpO2 99 % 09/02/19 1329  Vitals shown include unvalidated device data.  Last Pain:  Vitals:   09/02/19 1142  TempSrc: Oral  PainSc: 6       Patients Stated Pain Goal: 4 ([id number])  Complications: No apparent anesthesia complications

## 2019-09-02 NOTE — Brief Op Note (Signed)
09/02/2019  1:29 PM  PATIENT:  Anna Henson  48 y.o. female  PRE-OPERATIVE DIAGNOSIS:  Submucosal Fibroid, Pelvic Pain  POST-OPERATIVE DIAGNOSIS:  Submucosal Fibroid, Pelvic Pain  PROCEDURE:  Procedure(s): DILATATION & CURETTAGE/HYSTEROSCOPY WITH MYOSURE (N/A)  SURGEON:  Surgeon(s) and Role:    Aloha Gell, MD - Primary  PHYSICIAN ASSISTANT: None  ASSISTANTS: OR assistants  ANESTHESIA:   local and general  EBL: Minimal, per nursing notes  BLOOD ADMINISTERED:none  DRAINS: none   LOCAL MEDICATIONS USED:  MARCAINE    and OTHER 0.3 cc or 6 units of vasopressin mixed into 10 cc of quarter percent Marcaine.  Preoperatively patient also had 30 cc of 1% plain lidocaine injected as a paracervical block approximately 2 hours preop  SPECIMEN:  Source of Specimen:  Uterine fibroids  DISPOSITION OF SPECIMEN:  PATHOLOGY  COUNTS:  YES  TOURNIQUET:  * No tourniquets in log *  DICTATION: .Dragon Dictation  PLAN OF CARE: Discharge to home after PACU  PATIENT DISPOSITION:  PACU - hemodynamically stable.   Delay start of Pharmacological VTE agent (>24hrs) due to surgical blood loss or risk of bleeding: yes

## 2019-09-02 NOTE — Op Note (Signed)
09/02/2019  1:29 PM  PATIENT:  Anna Henson  48 y.o. female  PRE-OPERATIVE DIAGNOSIS:  Submucosal Fibroid, Pelvic Pain  POST-OPERATIVE DIAGNOSIS:  Submucosal Fibroid, Pelvic Pain  PROCEDURE:  Procedure(s): DILATATION & CURETTAGE/HYSTEROSCOPY WITH MYOSURE (N/A)  SURGEON:  Surgeon(s) and Role:    Aloha Gell, MD - Primary  PHYSICIAN ASSISTANT: None  ASSISTANTS: OR assistants  ANESTHESIA:   local and general  EBL: Minimal, per nursing notes  BLOOD ADMINISTERED:none  DRAINS: none   LOCAL MEDICATIONS USED:  MARCAINE    and OTHER 0.3 cc or 6 units of vasopressin mixed into 10 cc of quarter percent Marcaine.  Preoperatively patient also had 30 cc of 1% plain lidocaine injected as a paracervical block approximately 2 hours preop  SPECIMEN:  Source of Specimen:  Uterine fibroids  DISPOSITION OF SPECIMEN:  PATHOLOGY  COUNTS:  YES  TOURNIQUET:  * No tourniquets in log *  DICTATION: .Dragon Dictation  PLAN OF CARE: Discharge to home after PACU  PATIENT DISPOSITION:  PACU - hemodynamically stable.   Delay start of Pharmacological VTE agent (>24hrs) due to surgical blood loss or risk of bleeding: yes  Abx: 100mg  IV doxycycline  Findings: Large calcified cavitary fibroid-3.3 cm by sonogram measurements preoperatively but appear closer to 4 x 4 x 4 cm,  visualization of bilateral ostia, hemostasis post-procedure  Indications: menorrhagia, severe pelvic pain   After informed consent including discussion of risks of bleeding, infection, perforation,  the patient was taken to the operating room where general anesthesia was initiated without difficulty. She was prepped and draped in normal sterile fashion in the dorsal supine lithotomy position.  A bimanual examination was done to assess the size and position of the uterus. A speculum was placed in the vagina and single tooth tenaculum used to grasp the anterior lip of the cervix. Local anaesthetic with vasopressin was injected  at 5 and 7 o'clock in there cervico-paracervical junction.   The cervix was then serially dilated to a #21 Pratt dilator. The hysteroscope was inserted under direct visualization. Survey of the endometrium/ pathology with findings as above. The  XL Myosure blade was then placed through the operating channel, suction was applied and the fibroid and over 30 minutes only about 50% of the fibroid was removed.  This process was extremely slow due to the calcified nature of the fibroid.  Excellent visualization was had throughout.  At this time the MyoSure XL blade broke.  Attempt was made to place grasping forceps through the cervix to remove the remaining fibroid piece.  However the fibroid was too big to come through the cervix.  A second MyoSure XL blade was used and continued with serial morcellation.  This again was a slow process and over the next 20 minutes only about another 20% of the fibroid was morcellated.  At this time the decision was made to transect the fibroid at the base and attempt to remove the fibroid with graspers through the cervix.  I was able to transect the fibroid in half and use the blade to transected at its base.  Using polyp graspers after cervical dilation I was able to remove the remaining fibroid in 2 additional pieces.  Repeat hysteroscopy revealed no remaining pieces of fibroid, thin endometrium, bilateral normal ostia and no evidence of uterine damage.   The remainder of the uterus appeared normal. Hemostasis was noted.  The hysteroscope was then removed. Tenaculum was removed. The tenaculum site was hemostatic and the case was terminated. The patient tolerated  the procedure well. Sponge, lap and needle counts were correct and the patient was taken to the recovery room in stable condition.   Anna Henson 09/02/2019 1:31 PM

## 2019-09-02 NOTE — Discharge Instructions (Signed)
DISCHARGE INSTRUCTIONS: D&C  The following instructions have been prepared to help you care for yourself upon your return home.   Personal hygiene:  Use sanitary pads for vaginal drainage, not tampons.  Shower the day after your procedure.  NO tub baths, pools or Jacuzzis for 2-3 weeks.  Wipe front to back after using the bathroom.  Activity and limitations:  Do NOT drive or operate any equipment for 24 hours. The effects of anesthesia are still present and drowsiness may result.  Do NOT rest in bed all day.  Walking is encouraged.  Walk up and down stairs slowly.  You may resume your normal activity in one to two days or as indicated by your physician.  Sexual activity: NO intercourse for at least 2 weeks after the procedure, or as indicated by your physician.  Diet: Eat a light meal as desired this evening. You may resume your usual diet tomorrow.  Return to work: You may resume your work activities in one to two days or as indicated by your doctor.  What to expect after your surgery: Expect to have vaginal bleeding/discharge for 2-3 days and spotting for up to 10 days. It is not unusual to have soreness for up to 1-2 weeks. You may have a slight burning sensation when you urinate for the first day. Mild cramps may continue for a couple of days. You may have a regular period in 2-6 weeks.  Call your doctor for any of the following:  Excessive vaginal bleeding, saturating and changing one pad every hour.  Inability to urinate 6 hours after discharge from hospital.  Pain not relieved by pain medication.  Fever of 100.4 F or greater.  Unusual vaginal discharge or odor.   Post Anesthesia Home Care Instructions  Activity: Get plenty of rest for the remainder of the day. A responsible individual must stay with you for 24 hours following the procedure.  For the next 24 hours, DO NOT: -Drive a car -Paediatric nurse -Drink alcoholic beverages -Take any medication  unless instructed by your physician -Make any legal decisions or sign important papers.  Meals: Start with liquid foods such as gelatin or soup. Progress to regular foods as tolerated. Avoid greasy, spicy, heavy foods. If nausea and/or vomiting occur, drink only clear liquids until the nausea and/or vomiting subsides. Call your physician if vomiting continues.  Special Instructions/Symptoms: Your throat may feel dry or sore from the anesthesia or the breathing tube placed in your throat during surgery. If this causes discomfort, gargle with warm salt water. The discomfort should disappear within 24 hours.  If you had a scopolamine patch placed behind your ear for the management of post- operative nausea and/or vomiting:

## 2019-09-02 NOTE — Anesthesia Preprocedure Evaluation (Signed)
Anesthesia Evaluation  Patient identified by MRN, date of birth, ID band Patient awake    Reviewed: Allergy & Precautions, NPO status , Patient's Chart, lab work & pertinent test results  Airway Mallampati: II  TM Distance: >3 FB Neck ROM: Full    Dental  (+) Dental Advisory Given   Pulmonary former smoker,    breath sounds clear to auscultation       Cardiovascular hypertension, Pt. on medications + CAD and + Cardiac Stents   Rhythm:Regular Rate:Normal     Neuro/Psych  Headaches, Anxiety Depression Bipolar Disorder    GI/Hepatic Neg liver ROS, GERD  ,  Endo/Other  negative endocrine ROS  Renal/GU Renal disease     Musculoskeletal   Abdominal   Peds  Hematology  (+) anemia ,   Anesthesia Other Findings   Reproductive/Obstetrics                             Anesthesia Physical Anesthesia Plan  ASA: III  Anesthesia Plan: General   Post-op Pain Management:    Induction: Intravenous  PONV Risk Score and Plan: 3 and Dexamethasone, Ondansetron and Treatment may vary due to age or medical condition  Airway Management Planned: LMA  Additional Equipment:   Intra-op Plan:   Post-operative Plan: Extubation in OR  Informed Consent: I have reviewed the patients History and Physical, chart, labs and discussed the procedure including the risks, benefits and alternatives for the proposed anesthesia with the patient or authorized representative who has indicated his/her understanding and acceptance.     Dental advisory given  Plan Discussed with: CRNA  Anesthesia Plan Comments:         Anesthesia Quick Evaluation

## 2019-09-03 ENCOUNTER — Encounter (HOSPITAL_BASED_OUTPATIENT_CLINIC_OR_DEPARTMENT_OTHER): Payer: Self-pay | Admitting: Obstetrics

## 2019-09-03 LAB — SURGICAL PATHOLOGY

## 2019-09-05 ENCOUNTER — Other Ambulatory Visit: Payer: Self-pay | Admitting: Obstetrics

## 2019-09-05 NOTE — Anesthesia Postprocedure Evaluation (Signed)
Anesthesia Post Note  Patient: Anna Henson  Procedure(s) Performed: DILATATION & CURETTAGE/HYSTEROSCOPY WITH MYOSURE (N/A Vagina )     Patient location during evaluation: PACU Anesthesia Type: General Level of consciousness: awake and alert Pain management: pain level controlled Vital Signs Assessment: post-procedure vital signs reviewed and stable Respiratory status: spontaneous breathing, nonlabored ventilation, respiratory function stable and patient connected to nasal cannula oxygen Cardiovascular status: blood pressure returned to baseline and stable Postop Assessment: no apparent nausea or vomiting Anesthetic complications: no    Last Vitals:  Vitals:   09/02/19 1400 09/02/19 1515  BP: 124/73 120/80  Pulse: 74 81  Resp: 20 14  Temp:  36.9 C  SpO2: 96% 98%    Last Pain:  Vitals:   09/02/19 1515  TempSrc:   PainSc: [name]

## 2019-09-16 ENCOUNTER — Other Ambulatory Visit: Payer: Self-pay | Admitting: *Deleted

## 2019-09-16 MED ORDER — GABAPENTIN 300 MG PO CAPS: ORAL_CAPSULE | ORAL | 3 refills | Status: DC

## 2019-09-26 ENCOUNTER — Other Ambulatory Visit: Payer: Self-pay | Admitting: Neurology

## 2019-10-13 DIAGNOSIS — F32A Depression, unspecified: Secondary | ICD-10-CM | POA: Insufficient documentation

## 2019-10-13 DIAGNOSIS — F39 Unspecified mood [affective] disorder: Secondary | ICD-10-CM | POA: Insufficient documentation

## 2019-10-14 NOTE — Progress Notes (Signed)
Patient Care Team: Patient, No Pcp Per as PCP - General ([facility]) Hilty, Nadean Corwin, MD as PCP - Cardiology (Cardiology) Sater, Nanine Means, MD as Consulting Physician (Neurology) Jovita Kussmaul, MD as Consulting Physician (General Surgery) Nicholas Lose, MD as Consulting Physician (Hematology and Oncology) Kyung Rudd, MD as Consulting Physician (Radiation Oncology)  DIAGNOSIS:    ICD-10-CM   1. Malignant neoplasm of lower-outer quadrant of right breast of female, estrogen receptor positive ([place])  C50.511    Z17.0     SUMMARY OF ONCOLOGIC HISTORY: Oncology History  Malignant neoplasm of lower-outer quadrant of right breast of female, estrogen receptor positive ([place])  03/26/2017 Initial Diagnosis   Palpable right breast mass with a normal mammogram: 8:00 position by ultrasound 3.2 cm irregular mass: Grade 2 invasive lobular cancer ER 85%, PR 100%, Ki-67 2%, HER-2 negative ratio 1.5, T2 N0 stage II a clinical stage   04/20/2017 Genetic Testing   Genetic counseling and testing for hereditary cancer syndromes performed on . Results are negative for pathogenic mutations in 46 genes analyzed by [facility] Common Hereditary Cancers Panel. Results are dated . Genes tested: APC, ATM, AXIN2, BARD1, BMPR1A, BRCA1, BRCA2, BRIP1, CDH1, CDKN2A, CHEK2, CTNNA1, DICER1, EPCAM, GREM1, HOXB13, KIT, MEN1, MLH1, MSH2, MSH3, MSH6, MUTYH, NBN, NF1, NTHL1, PALB2, PDGFRA, PMS2, POLD1, POLE, PTEN, RAD50, RAD51C, RAD51D, SDHA, SDHB, SDHC, SDHD, SMAD4, SMARCA4, STK11, TP53, TSC1, TSC2, and VHL.  Variants of uncertain significance (not clinically actionable) were noted in APC and BRIP1.      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 with above-mentioned history of right breast cancer treated with lumpectomy, radiation, and who is currently on tamoxifen. Mammogram on 05/06/19 showed no evidence of malignancy bilaterally. She presents to the clinic today for follow-up.  She has been tolerating tamoxifen extremely well.  She denies any lumps or nodules in the breast.  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  ALLERGIES:  is allergic to pork-derived products.  MEDICATIONS:  Current Outpatient Medications  Medication Sig Dispense Refill  . traMADol (ULTRAM) 50 MG tablet TAKE 1 TABLET(50 MG) BY MOUTH TWICE DAILY AS NEEDED 60 tablet 5  . acetaminophen (TYLENOL) 325 MG tablet Take 325-650 mg by mouth every 8 (eight) hours as needed (for pain or cramping).     Marland Kitchen amLODipine (NORVASC) 10 MG tablet TAKE 1 TABLET(10 MG) BY MOUTH DAILY (Patient taking differently: Take 10 mg by mouth daily. ) 30 tablet 6  . aspirin 81 MG chewable tablet Chew 1 tablet (81 mg total) by mouth daily.    Marland Kitchen atorvastatin (LIPITOR) 80 MG tablet TAKE 1 TABLET(80 MG) BY MOUTH DAILY (Patient taking differently: Take 80 mg by mouth at bedtime. ) 90 tablet 1  . baclofen (LIORESAL) 10 MG tablet Take up to 4/day for muscle spasms (Patient taking differently: Take 10 mg by mouth 4 (four) times daily. Take up to 4/day for muscle spasms) 120 each 11  .  carvedilol (COREG) 12.5 MG tablet Take 1 tablet (12.5 mg total) by mouth 2 (two) times daily with a meal. 180 tablet 3  . gabapentin (NEURONTIN) 300 MG capsule '300mg'$  in the morning, '300mg'$  in the afternoon and '600mg'$  at bedtime 120 capsule 3  . hydrALAZINE (APRESOLINE) 25 MG tablet TAKE 1 TABLET BY MOUTH THREE TIMES DAILY (Patient taking differently: Take 25 mg by mouth 3 (three) times daily. ) 270 tablet 1   . ibuprofen (ADVIL) 800 MG tablet Take 1 tablet (800 mg total) by mouth every 8 (eight) hours as needed for mild pain. 30 tablet 1  . lisinopril (ZESTRIL) 20 MG tablet TAKE 1 TABLET(20 MG) BY MOUTH TWICE DAILY (Patient taking differently: Take 20 mg by mouth 2 (two) times daily. ) 60 tablet 6  . nitroGLYCERIN (NITROSTAT) 0.4 MG SL tablet PLACE 1 TABLET UNDER THE TONGUE EVERY 5 MINUTES AS NEEDED FOR CHEST PAIN 25 tablet 4  . tamoxifen (NOLVADEX) 20 MG tablet Take 1 tablet (20 mg total) by mouth daily. (Patient taking differently: Take 20 mg by mouth daily. ) 90 tablet 3   No current facility-administered medications for this visit.   Facility-Administered Medications Ordered in Other Visits  Medication Dose Route Frequency Provider Last Rate Last Admin  . gadopentetate dimeglumine (MAGNEVIST) injection 12 mL  12 mL Intravenous Once PRN Sater, Richard A, MD      . gadopentetate dimeglumine (MAGNEVIST) injection 16 mL  16 mL Intravenous Once PRN Sater, Nanine Means, MD        PHYSICAL EXAMINATION: ECOG PERFORMANCE STATUS: 1 - Symptomatic but completely ambulatory  Vitals:   10/15/19 1540  BP: (!) 143/83  Pulse: 88  Resp: 18  Temp: 99.1 F (37.3 C)  SpO2: 100%   Filed Weights   10/15/19 1540  Weight: 153 lb 3.2 oz (69.5 kg)    LABORATORY DATA:  I have reviewed the data as listed CMP Latest Ref Rng & Units 08/29/2019 07/28/2018 07/28/2018  Glucose 70 - 99 mg/dL 80 99 -  BUN 6 - 20 mg/dL 9 13 -  Creatinine 0.44 - 1.00 mg/dL 0.99 1.27(H) 1.24(H)  Sodium 135 - 145 mmol/L 139 137 -  Potassium 3.5 - 5.1 mmol/L 3.6 4.8 -  Chloride 98 - 111 mmol/L 109 112(H) -  CO2 22 - 32 mmol/L 21(L) 18(L) -  Calcium 8.9 - 10.3 mg/dL 9.2 8.4(L) -  Total Protein 6.0 - 8.5 g/dL - - -  Total Bilirubin 0.0 - 1.2 mg/dL - - -  Alkaline Phos 39 - 117 IU/L - - -  AST 0 - 40 IU/L - - -  ALT 0 - 32 IU/L - - -    Lab Results  Component Value Date   WBC 10.9 (H) 08/29/2019   HGB 8.2 (L) 08/29/2019   HCT  27.3 (L) 08/29/2019   MCV 72.0 (L) 08/29/2019   PLT 403 (H) 08/29/2019   NEUTROABS 7.1 (H) 04/01/2018    ASSESSMENT & PLAN:  Malignant neoplasm of lower-outer quadrant of right breast of female, estrogen receptor positive ([place]) 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  05/06/2019: Benign Breast cancer surveillance: 1.  Breast exam 10/15/2019: Benign 2.  Mammogram: 03/27/2019 benign  Return to clinic  in 1 year for follow-up    No orders of the defined types were placed in this encounter.  The patient has a good understanding of the overall plan. she agrees with it. she will call with any problems that may develop before the next visit here.  Total time spent: 15 mins including face to face time and time spent for planning, charting and coordination of care  Nicholas Lose, MD 10/15/2019  I, Cloyde Reams Dorshimer, am acting as scribe for Dr. Nicholas Lose.  I have reviewed the above documentation for accuracy and completeness, and I agree with the above.

## 2019-10-15 ENCOUNTER — Inpatient Hospital Stay: Payer: 59 | Attending: Hematology and Oncology | Admitting: Hematology and Oncology

## 2019-10-15 ENCOUNTER — Other Ambulatory Visit: Payer: Self-pay

## 2019-10-15 DIAGNOSIS — Z170 Estrogen receptor positive status [ER+]: Secondary | ICD-10-CM | POA: Diagnosis not present

## 2019-10-15 DIAGNOSIS — C50511 Malignant neoplasm of lower-outer quadrant of right female breast: Secondary | ICD-10-CM | POA: Diagnosis not present

## 2019-10-15 DIAGNOSIS — I890 Lymphedema, not elsewhere classified: Secondary | ICD-10-CM | POA: Insufficient documentation

## 2019-10-15 DIAGNOSIS — N951 Menopausal and female climacteric states: Secondary | ICD-10-CM | POA: Insufficient documentation

## 2019-10-15 MED ORDER — TAMOXIFEN CITRATE 20 MG PO TABS: 20.0000 mg | ORAL_TABLET | Freq: Every day | ORAL | 3 refills | Status: DC

## 2019-10-15 NOTE — Assessment & Plan Note (Signed)
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  05/06/2019: Benign Breast cancer surveillance: 1.  Breast exam 10/15/2019: Benign 2.  Mammogram: 03/27/2019 benign  Return to clinic  in 1 year for follow-up

## 2019-10-16 ENCOUNTER — Telehealth: Payer: Self-pay | Admitting: Hematology and Oncology

## 2019-10-16 NOTE — Telephone Encounter (Signed)
I talk with patient regarding schedule  

## 2019-10-24 ENCOUNTER — Other Ambulatory Visit: Payer: Self-pay | Admitting: Internal Medicine

## 2019-10-24 DIAGNOSIS — E785 Hyperlipidemia, unspecified: Secondary | ICD-10-CM

## 2019-11-13 ENCOUNTER — Other Ambulatory Visit: Payer: Self-pay | Admitting: *Deleted

## 2019-11-13 MED ORDER — GABAPENTIN 300 MG PO CAPS: ORAL_CAPSULE | ORAL | 3 refills | Status: DC

## 2019-11-14 ENCOUNTER — Encounter (HOSPITAL_COMMUNITY): Payer: Self-pay | Admitting: Emergency Medicine

## 2019-11-14 ENCOUNTER — Emergency Department (HOSPITAL_COMMUNITY): Payer: 59

## 2019-11-14 ENCOUNTER — Other Ambulatory Visit: Payer: Self-pay

## 2019-11-14 ENCOUNTER — Emergency Department (HOSPITAL_COMMUNITY)
Admission: EM | Admit: 2019-11-14 | Discharge: 2019-11-15 | Disposition: A | Discharge status: Home / Self Care | Payer: 59 | Attending: Emergency Medicine | Admitting: Emergency Medicine

## 2019-11-14 DIAGNOSIS — Z923 Personal history of irradiation: Secondary | ICD-10-CM | POA: Diagnosis not present

## 2019-11-14 DIAGNOSIS — D509 Iron deficiency anemia, unspecified: Secondary | ICD-10-CM | POA: Diagnosis not present

## 2019-11-14 DIAGNOSIS — Z87891 Personal history of nicotine dependence: Secondary | ICD-10-CM | POA: Diagnosis not present

## 2019-11-14 DIAGNOSIS — R5381 Other malaise: Secondary | ICD-10-CM | POA: Insufficient documentation

## 2019-11-14 DIAGNOSIS — Z853 Personal history of malignant neoplasm of breast: Secondary | ICD-10-CM | POA: Diagnosis not present

## 2019-11-14 DIAGNOSIS — M7918 Myalgia, other site: Secondary | ICD-10-CM | POA: Diagnosis not present

## 2019-11-14 DIAGNOSIS — I252 Old myocardial infarction: Secondary | ICD-10-CM | POA: Insufficient documentation

## 2019-11-14 DIAGNOSIS — R079 Chest pain, unspecified: Secondary | ICD-10-CM

## 2019-11-14 DIAGNOSIS — B349 Viral infection, unspecified: Secondary | ICD-10-CM

## 2019-11-14 DIAGNOSIS — N183 Chronic kidney disease, stage 3 unspecified: Secondary | ICD-10-CM | POA: Diagnosis not present

## 2019-11-14 DIAGNOSIS — R05 Cough: Secondary | ICD-10-CM | POA: Insufficient documentation

## 2019-11-14 DIAGNOSIS — I2510 Atherosclerotic heart disease of native coronary artery without angina pectoris: Secondary | ICD-10-CM | POA: Insufficient documentation

## 2019-11-14 DIAGNOSIS — I129 Hypertensive chronic kidney disease with stage 1 through stage 4 chronic kidney disease, or unspecified chronic kidney disease: Secondary | ICD-10-CM | POA: Diagnosis not present

## 2019-11-14 DIAGNOSIS — R52 Pain, unspecified: Secondary | ICD-10-CM

## 2019-11-14 DIAGNOSIS — Z20822 Contact with and (suspected) exposure to covid-19: Secondary | ICD-10-CM | POA: Diagnosis not present

## 2019-11-14 LAB — BASIC METABOLIC PANEL
Anion gap: 12 (ref 5–15)
BUN: 10 mg/dL (ref 6–20)
CO2: 20 mmol/L — ABNORMAL LOW (ref 22–32)
Calcium: 9 mg/dL (ref 8.9–10.3)
Chloride: 110 mmol/L (ref 98–111)
Creatinine, Ser: 1.17 mg/dL — ABNORMAL HIGH (ref 0.44–1.00)
GFR calc Af Amer: >60 mL/min (ref >60)
GFR calc non Af Amer: 55 mL/min — ABNORMAL LOW (ref >60)
Glucose, Bld: 99 mg/dL (ref 70–99)
Potassium: 3.1 mmol/L — ABNORMAL LOW (ref 3.5–5.1)
Sodium: 142 mmol/L (ref 135–145)

## 2019-11-14 LAB — CBC
HCT: 24.4 % — ABNORMAL LOW (ref 36.0–46.0)
Hemoglobin: 7.3 g/dL — ABNORMAL LOW (ref 12.0–15.0)
MCH: 21.9 pg — ABNORMAL LOW (ref 26.0–34.0)
MCHC: 29.9 g/dL — ABNORMAL LOW (ref 30.0–36.0)
MCV: 73.1 fL — ABNORMAL LOW (ref 80.0–100.0)
Platelets: 422 10*3/uL — ABNORMAL HIGH (ref 150–400)
RBC: 3.34 MIL/uL — ABNORMAL LOW (ref 3.87–5.11)
RDW: 20.8 % — ABNORMAL HIGH (ref 11.5–15.5)
WBC: 9.2 10*3/uL (ref 4.0–10.5)
nRBC: 0 % (ref 0.0–0.2)

## 2019-11-14 LAB — POC OCCULT BLOOD, ED: Fecal Occult Bld: NEGATIVE

## 2019-11-14 LAB — TROPONIN I (HIGH SENSITIVITY)
Troponin I (High Sensitivity): 4 ng/L (ref <18)
Troponin I (High Sensitivity): 4 ng/L (ref <18)

## 2019-11-14 MED ORDER — SODIUM CHLORIDE 0.9% FLUSH: 3.0000 mL | Freq: Once | INTRAVENOUS | Status: DC

## 2019-11-14 NOTE — ED Provider Notes (Signed)
[facility] Emergency Department Provider Note MRN:  [id number]  Arrival date & time: 11/15/19     Chief Complaint   Chest pain History of Present Illness   Anna Henson is a 49 y.o. year-old female with a history of transverse myelitis, CAD presenting to the ED with chief complaint of chest pain.  3 or 4 days of chest pain.  Located on the right anterior and lateral ribs, sudden onset, worse with palpation of the ribs.  Described as "something exploding".  Endorsing recent cough, general malaise, body aches, low energy.  Denies fever.  Denies numbness or weakness to the arms or legs.  Does not feel like her prior heart attack.  Review of Systems  A complete 10 system review of systems was obtained and all systems are negative except as noted in the HPI and PMH.   Patient's Health History    Past Medical History:  Diagnosis Date  . Anemia   . Anxiety   . Bipolar disorder in full remission ([place])   . CAD in native artery cardiologist--  dr hilty   a. 09/2014 Cath/PCI in setting of [place] - s/p 2.25 x 12 mm Promus Premier DES to mid RCA;  b. 11/2016 Myoview: EF 56%, no ischemia;  c. 12/2016 NSTEMI/PCI: LM nl, LAd 25p, LCX 30p, RCA 100p (2.75x38 Promus Premier DES), mRCA 10 ISR, EF 50-55%.  . CKD (chronic kidney disease), stage III   . Depression   . Genetic testing 04/23/2017   Ms. Goncalves underwent genetic counseling and testing for hereditary cancer syndromes on 04/05/2017. Her results were negative for mutations in all 46 genes analyzed by [facility]'s 46-gene Common Hereditary Cancers Panel. Genes analyzed include: APC, ATM, AXIN2, BARD1, BMPR1A, BRCA1, BRCA2, BRIP1, CDH1, CDKN2A, CHEK2, CTNNA1, DICER1, EPCAM, GREM1, HOXB13, KIT, MEN1, MLH1, MSH2, MSH3, MSH6, MUTYH, NBN,  . GERD (gastroesophageal reflux disease)   . Headache   . History of external beam radiation therapy    right breast 07-27-2017  to 09-25-2017  . History of non-ST elevation myocardial infarction  (NSTEMI)   . HOCM (hypertrophic obstructive cardiomyopathy) ([place]) followed by cardiology   a. 12/2016 Echo: EF 65-70%,  mod conc LVH, dynamic obstruction @ rest, peak velocity of 291 cm/sec w/ peak gradient of 55mHg, no rwma, Gr1 DD, triv TR, PASP 160mg.  . Marland Kitchenyperlipidemia   . Hypertension   . Malignant neoplasm of lower-outer quadrant of right breast of female, estrogen receptor positive [facility] dr guLindi Adie dx 06/ 2018--- right breast invasive lobular carcinoma, ductal carcinoma w/ LCIS---- 06-21-2017 s/p right breast lumpecotmy w/ sln disseciton;   completed radiation 09-25-2017;  on tamoxifen  . S/P drug eluting coronary stent placement    09[facility]14-2014[facility]  PCI with DES x1 to proxRCA  . [facility] dr sa[facility]cia Shelling 01/ 2017  dx transverse myelitis w/ right side numbness (09-01-2019  currently lower extremity weakness, mucsle spasms, and gait disturbance)  . Wears glasses     Past Surgical History:  Procedure Laterality Date  . BREAST LUMPECTOMY WITH RADIOACTIVE SEED AND SENTINEL LYMPH NODE BIOPSY Right 06/21/2017   Procedure: RIGHT BREAST BRACKETED SEED GUIDED LUMPECTOMY AND SENTINEL LYMPH NODE BIOPSY;  Surgeon: ToJovita KussmaulMD;  [facility] Service: General;  Laterality: Right;  . CORONARY/GRAFT ACUTE MI REVASCULARIZATION N/A 12/31/2016   Procedure: Coronary/Graft Acute MI Revascularization;  Surgeon: MiSherren MochaMD;  [facility];  Service: Cardiovascular;  Laterality: N/A;  . DILATATION & CURETTAGE/HYSTEROSCOPY WITH MYOSURE N/A 09/02/2019   Procedure: DILATATION & CURETTAGE/HYSTEROSCOPY WITH MYOSURE;  Surgeon: Aloha Gell, MD;  Location: [place];  Service: Gynecology;  Laterality: N/A;  . LEFT HEART CATH AND CORONARY ANGIOGRAPHY N/A 12/31/2016   Procedure: Left Heart Cath and Coronary Angiography;  Surgeon: Sherren Mocha, MD;  Location: [place] CV LAB;  Service:  Cardiovascular;  Laterality: N/A;  . LEFT HEART CATHETERIZATION WITH CORONARY ANGIOGRAM N/A 09/14/2014   Procedure: LEFT HEART CATHETERIZATION WITH CORONARY ANGIOGRAM;  Surgeon: Burnell Blanks, MD;  Location: [facility] C[facility] LAB;  Service: Cardiovascular;  Laterality: N/A;  . PERCUTANEOUS CORONARY STENT INTERVENTION (PCI-S)  09/14/2014   Procedure: PERCUTANEOUS CORONARY STENT INTERVENTION (PCI-S);  Surgeon: Burnell Blanks, MD;  Location: [facility] CATH LAB;  Service: Cardiovascular;;  . TONSILLECTOMY  2005  . UMBILICAL HERNIA REPAIR  2001; 2005    Family History  Problem Relation Age of Onset  . Diabetes Mother   . Heart disease Mother   . Hyperlipidemia Mother   . Hypertension Mother   . Lung cancer Father   . Drug abuse Father   . Brain cancer Father 51  . Bone cancer Father 2  . Drug abuse Sister   . Mental illness Brother   . Mental illness Maternal Grandmother   . Stomach cancer Paternal Grandmother 35       d.75    Social History   Socioeconomic History  . Marital status: Single    Spouse name: Not on file  . Number of children: 0  . Years of education: masters  . Highest education level: Not on file  Occupational History  . Occupation: Therapist, art at a call center    Employer: [employer]  Tobacco Use  . Smoking status: Former Smoker    Packs/day: 0.50    Years: 30.00    Pack years: 15.00    Types: Cigarettes    Quit date: 10/05/2015    Years since quitting: 4.1  . Smokeless tobacco: Never Used  Substance and Sexual Activity  . Alcohol use: Yes    Alcohol/week: 2.0 standard drinks    Types: 2 Cans of beer per week  . Drug use: No  . Sexual activity: Not Currently  Other Topics Concern  . Not on file  Social History Narrative   Consumes 2 cups of caffeine daily   Social Determinants of Health   Financial Resource Strain:   . Difficulty of Paying Living Expenses: Not on file  Food Insecurity:   . Worried About Charity fundraiser in the Last Year: Not  on file  . Ran Out of Food in the Last Year: Not on file  Transportation Needs:   . Lack of Transportation (Medical): Not on file  . Lack of Transportation (Non-Medical): Not on file  Physical Activity:   . Days of Exercise per Week: Not on file  . Minutes of Exercise per Session: Not on file  Stress:   . Feeling of Stress : Not on file  Social Connections:   . Frequency of Communication with Friends and Family: Not on file  . Frequency of Social Gatherings with Friends and Family: Not on file  . Attends Religious Services: Not on file  . Active Member of Clubs or Organizations: Not on file  . Attends Archivist Meetings: Not on file  . Marital Status: Not on file  Intimate Partner Violence:   . Fear of Current  or Ex-Partner: Not on file  . Emotionally Abused: Not on file  . Physically Abused: Not on file  . Sexually Abused: Not on file     Physical Exam   Vitals:   11/14/19 1933 11/14/19 2237  BP: 133/77 (!) 154/79  Pulse: 78 72  Resp: 18   Temp: 98.4 F (36.9 C) 98.3 F (36.8 C)  SpO2: 100% 100%    CONSTITUTIONAL: Well-appearing, NAD NEURO:  Alert and oriented x 3, no focal deficits EYES:  eyes equal and reactive ENT/NECK:  no LAD, no JVD CARDIO: Regular rate, well-perfused, normal S1 and S2 PULM:  CTAB no wheezing or rhonchi GI/GU:  normal bowel sounds, non-distended, non-tender MSK/SPINE:  No gross deformities, no edema SKIN:  no rash, atraumatic PSYCH:  Appropriate speech and behavior  *Additional and/or pertinent findings included in MDM below  Diagnostic and Interventional Summary    EKG Interpretation  Date/Time:  Friday November 14 2019 16:48:46 EST Ventricular Rate:  88 PR Interval:  138 QRS Duration: 74 QT Interval:  380 QTC Calculation: 459 R Axis:   61 Text Interpretation: Normal sinus rhythm Nonspecific ST abnormality Abnormal ECG Confirmed by Gerlene Fee [phone]) on 11/14/2019 11:06:41 PM      Cardiac Monitoring  Interpretation:  Labs Reviewed  BASIC METABOLIC PANEL - Abnormal; Notable for the following components:      Result Value   Potassium 3.1 (*)    CO2 20 (*)    Creatinine, Ser 1.17 (*)    GFR calc non Af Amer 55 (*)    All other components within normal limits  CBC - Abnormal; Notable for the following components:   RBC 3.34 (*)    Hemoglobin 7.3 (*)    HCT 24.4 (*)    MCV 73.1 (*)    MCH 21.9 (*)    MCHC 29.9 (*)    RDW 20.8 (*)    Platelets 422 (*)    All other components within normal limits  SARS CORONAVIRUS 2 (TAT 6-24 HRS)  OCCULT BLOOD X 1 CARD TO LAB, STOOL  POC OCCULT BLOOD, ED  TROPONIN I (HIGH SENSITIVITY)  TROPONIN I (HIGH SENSITIVITY)    DG Chest 2 View  Final Result      Medications  sodium chloride flush (NS) 0.9 % injection 3 mL (has no administration in time range)     Procedures  /  Critical Care Procedures  ED Course and Medical Decision Making  I have reviewed the triage vital signs, the nursing notes, and pertinent available records from the EMR.  Pertinent labs & imaging results that were available during my care of the patient were reviewed by me and considered in my medical decision making (see below for details).     Suspect musculoskeletal etiology given the worsening with motion and reproducibility on exam with palpation to the ribs.  PERC negative.  Would be very atypical for ACS.  EKG is reassuring, troponin negative x2.  Also considering GI etiology, particularly gastritis with ulcer given patient's downtrending hemoglobin.  Will need to do Hemoccult here in the emergency department.  12:04 AM update: Hemoccult is negative.  Suspect menstrual cycle as source of patient's anemia, low MCV suggesting iron deficiency.  Hemodynamically stable, no indication for admission or further testing, appropriate for iron supplementation and PCP follow-up.  Barth Kirks. Sedonia Small, MD [place] [email or web address]  Final Clinical Impressions(s) / ED Diagnoses     ICD-10-CM   1. Viral illness  B34.9   2. Chest pain, unspecified type  R07.9   3. Body aches  R52   4. Iron deficiency anemia, unspecified iron deficiency anemia type  D50.9     ED Discharge Orders         Ordered    ferrous sulfate 325 (65 FE) MG tablet  Daily     11/15/19 0004           Discharge Instructions Discussed with and Provided to Patient:     Discharge Instructions     You were evaluated in the Emergency Department and after careful evaluation, we did not find any emergent condition requiring admission or further testing in the hospital.  Your exam/testing today is overall reassuring.  We found that your blood levels are low, likely due to low iron levels.  We did not find any blood in your stool.  Please take the iron supplements as directed and follow-up closely with your primary care doctor.  Please return to the Emergency Department if you experience any worsening of your condition.  We encourage you to follow up with a primary care provider.  Thank you for allowing [place] to be a part of your care.       Maudie Flakes, MD 11/15/19 Adelfa Koh

## 2019-11-14 NOTE — ED Notes (Signed)
Pt reports chest discomfort ongoing for a few days, as well as congested cough and nausea and vomiting intermittently for the past couple days.

## 2019-11-14 NOTE — ED Triage Notes (Signed)
Pt here from home with c/o right upper quadrant pain after she felt a "pop" in her side along with some chest discomfort, pt has been taking some lasix also due to increase swelling

## 2019-11-15 LAB — SARS CORONAVIRUS 2 (TAT 6-24 HRS): SARS Coronavirus 2: NEGATIVE

## 2019-11-15 MED ORDER — FERROUS SULFATE 325 (65 FE) MG PO TABS: 325.0000 mg | ORAL_TABLET | Freq: Every day | ORAL | 0 refills | Status: DC

## 2019-11-15 NOTE — Discharge Instructions (Signed)
You were evaluated in the Emergency Department and after careful evaluation, we did not find any emergent condition requiring admission or further testing in the hospital.  Your exam/testing today is overall reassuring.  We found that your blood levels are low, likely due to low iron levels.  We did not find any blood in your stool.  Please take the iron supplements as directed and follow-up closely with your primary care doctor.  Please return to the Emergency Department if you experience any worsening of your condition.  We encourage you to follow up with a primary care provider.  Thank you for allowing [place] to be a part of your care.

## 2019-11-19 ENCOUNTER — Other Ambulatory Visit: Payer: Self-pay | Admitting: Physician Assistant

## 2019-11-19 NOTE — Telephone Encounter (Signed)
This is Dr. Hilty's pt 

## 2019-11-21 ENCOUNTER — Other Ambulatory Visit: Payer: Self-pay | Admitting: Internal Medicine

## 2019-11-26 ENCOUNTER — Telehealth: Payer: Self-pay | Admitting: Internal Medicine

## 2019-11-26 MED ORDER — FUROSEMIDE 20 MG PO TABS: 20.0000 mg | ORAL_TABLET | Freq: Every day | ORAL | 0 refills | Status: DC | PRN

## 2019-11-26 NOTE — Telephone Encounter (Signed)
New Message  Pt called and stated that her medication she need is being denied and she doesn't understand why. Please discuss with her

## 2019-11-26 NOTE — Telephone Encounter (Signed)
Spoke with patient who reports lisinopril was not ready for pick up at pharmacy. Explained this was refilled on 11/19/19.   She also reports she was not able to get her PRN lasix 20mg . Explained this was removed from med list at 09/01/19 visit. Refilled this medication for patient

## 2019-12-02 NOTE — Telephone Encounter (Signed)
Called pt and offered appt this am at 10:15am with Dr. Felecia Shelling. Pt just got to work and has to speak with boss. She will call back around 8am to let us know if she can accept appt.  Her dad just passed away this past 2023/01/20 and her mother was just dx with bilateral breast cancer.

## 2019-12-02 NOTE — Telephone Encounter (Signed)
Called, LVM for pt to call office back to let us know if she is able to come in this am for appt.

## 2019-12-04 ENCOUNTER — Telehealth: Payer: Self-pay | Admitting: *Deleted

## 2019-12-04 NOTE — Telephone Encounter (Signed)
Called the patient and scheduled a new patient appt for tomorrow. Gave the policy for masks, visitors and parking

## 2019-12-05 ENCOUNTER — Encounter: Payer: Self-pay | Admitting: Gynecologic Oncology

## 2019-12-05 ENCOUNTER — Inpatient Hospital Stay: Payer: 59 | Attending: Hematology and Oncology | Admitting: Gynecologic Oncology

## 2019-12-05 ENCOUNTER — Other Ambulatory Visit: Payer: Self-pay

## 2019-12-05 VITALS — BP 114/81 | HR 73 | Temp 99.1°F | Resp 18 | Wt 150.5 lb

## 2019-12-05 DIAGNOSIS — C50511 Malignant neoplasm of lower-outer quadrant of right female breast: Secondary | ICD-10-CM | POA: Diagnosis not present

## 2019-12-05 DIAGNOSIS — N83202 Unspecified ovarian cyst, left side: Secondary | ICD-10-CM

## 2019-12-05 DIAGNOSIS — N83291 Other ovarian cyst, right side: Secondary | ICD-10-CM | POA: Insufficient documentation

## 2019-12-05 DIAGNOSIS — N83201 Unspecified ovarian cyst, right side: Secondary | ICD-10-CM

## 2019-12-05 NOTE — Patient Instructions (Signed)
Was a pleasure meeting you today.  I will call you or release the results of your ultrasound to you once they are back.  As long as there are none no concerning features of the cyst or it has not significantly increased in size, then we can delay surgery until you are ready to proceed.  If you have any questions or concerns prior to that time please call me at [phone].

## 2019-12-05 NOTE — Progress Notes (Signed)
GYNECOLOGIC ONCOLOGY NEW PATIENT CONSULTATION   Patient Name: Anna Henson  Patient Age: 49 y.o. Date of Service: 12/05/19 Referring Provider: No referring provider defined for this encounter.   Primary Care Provider: Patient, No Pcp Per Consulting Provider: Jeral Pinch, MD   Assessment/Plan:  [phone] premenopausal patient with a personal history of estrogen receptor positive breast cancer, currently on tamoxifen, now with a small complex ovarian cyst.  We reviewed findings from her ultrasounds in November, January, and this month.  While I do not have the photos, she and I read over the report together that we received from her gynecologist.  There has been some fluctuation in the size of the right ovarian cyst with most recent imaging showing a possible solid component.  The left complex cyst seen in January had resolved on most recent ultrasound.  We discussed that in premenopausal patients who are ovulating regularly, cyst formation is very common.  I reviewed with her the imaging features that are concerning for possible malignancy.  I am unable to see the images themselves since her ultrasounds were performed outside of our system, but by the report, there is very little described that I find worrisome for possible malignancy.  Additionally, she has had recent tumor markers that were all normal.  We discussed the limitations of tumor markers.  Given her history of estrogen receptor positive breast cancer, she has been on tamoxifen now for about 2 years.  She has not had any change in terms of her bleeding.  We reviewed the increased risk on tamoxifen of endometrial thickening, hyperplasia and cancer.  She does not remember having a discussion with her medical oncologist about possible transition to an AI if she were to undergo ovarian ablation, either hormonally or with surgery.  Given her own breast cancer history as well as her family history, even in the setting of no pathogenic genetic  mutation, I think it would be very reasonable to move forward with therapeutic and likely risk reducing BSO.  In terms of concurrent hysterectomy, the patient has a history of fibroids, 1 of which was recently resected in November.  Her menses seem to have stabilized and her cramping is improved.  Given the uterine risks associated with tamoxifen use, the plan is to continue tamoxifen even if she undergoes BSO, I think it is reasonable to offer her concurrent hysterectomy.  The patient currently has multiple social factors in her life that would make surgery difficult at this time (death of one parent and cancer diagnosis in the other parent).  I recommend that we repeat an ultrasound in 6 weeks to reassess the complex adnexal mass.  Again, I suspect a benign etiology for this cyst.  I let the patient know that I would send my note to both her gynecologist as well as her medical oncologist.  I think that if her ultrasound is reassuring, that we can delay moving forward with surgery at this time.  If, in the future, she is ready to proceed with surgery, I think it would be reasonable for her gynecologist to perform her surgery or I am happy to see the patient back to discuss and plan for BSO with or without concurrent hysterectomy.  A copy of this note was sent to the patient's referring provider.   50 minutes of total time was spent for this patient encounter, including preparation, face-to-face counseling with the patient and coordination of care, and documentation of the encounter.   Jeral Pinch, MD  Division of Gynecologic  Oncology  Department of Obstetrics and Gynecology  [facility]  ___________________________________________  Chief Complaint: Chief Complaint  Patient presents with  . Complex cyst of right ovary    New patient    History of Present Illness:  Anna Henson is a 49 y.o. y.o. female who is seen in consultation at the request of Dr. Pamala Hurry  for an evaluation of complex ovarian cyst..  The patient was initially seen last year with cramping pelvic pain and found to have a 3.3 cm intracavitary uterine fibroid.  She was taken to the operating room on 09/02/2019 with pathology showing fragments of smooth muscle consistent with leiomyoma as well as scant benign inactive endometrium.  She presented again with pelvic pain in January with bilateral ovarian cysts noted on office ultrasound.  She was seen again in February with significant improvement in her pain and resolution of left ovarian cyst.  The patient was seen in the emergency department at the beginning of February with chest pain in the setting of a history of coronary artery disease, cardial infarction, and PCI in 2015 and again in 2018.  Ultimately patient's symptoms deemed to be musculoskeletal in origin.  She notes today that her chest pain is much better but she is still not feeling 100%.  Today, she endorses cramping currently as she is due for her menses.  Her last menses was the first week end of February.  Her menses have overall been better the last several months.  She endorses having clots and heavier bleeding previously with her menses.  She denies any intermenstrual bleeding.  Before her hysteroscopic myomectomy, she endorses having 2 months of intermittent nausea and cramping which has almost completely resolved now.  She notes very infrequent cramping outside of her menstrual-related cramping  Patient's family history significant for bilateral breast cancer in her mother entheses diagnosed on the 15th of this month), metastatic lung cancer in her father (tobacco user), and stomach cancer in her grandmother.  She had genetic testing in 2018 with [place] noted in APC and BRIP1.  In addition to her mother's recent breast cancer diagnosis, the patient's father passed away this past week.  Treatment History: Ultrasound 08/2019: Uterus 9 x 6 x 5 cm.  Endometrial lining 20 mm.   Submucosal fibroid noted within the cavity measuring 2.8 x 1.8 x 2.5 cm.  Second anterior intramuscular fibroid measuring up to 1.9 cm.  Simple appearing right ovarian cyst measures 3.5 x 1.3 x 3.3 cm.  Trace free fluid in the cul-de-sac. 08/2019: Underwent hysteroscopic resection of intramuscular fibroid. Ultrasound 10/21/2019: Uterus measures 10 x 4.8 x 5 cm, endometrial lining measures 8 mm.  2 fibroids noted.  Complex left ovarian cyst measures 2 x 1.6 x 1.3 cm.  Right ovary with 2.5 x 2.3 x 2.2 cm thick-walled complex cyst with peripheral blood flow, possibly hemorrhagic cyst.  Small amount of complex free fluid within the right adnexal region. Ultrasound 12/01/2019: Thickened endometrial lining on day 21 of patient's menstrual cycle.  Anterior fibroid again noted.  Complex cyst within the right ovary measures 3.3 x 1.7 x 2.6 cm with internal striation and peripheral blood flow, possibly consistent with hemorrhagic cyst.  There is also a focal hypoechoic area measuring up to 2.2 cm that may represent inhomogenous focal echotexture versus other etiology.  A solid mass cannot be excluded.  No free fluid.  Treatment History: Oncology History  Malignant neoplasm of lower-outer quadrant of right breast of female, estrogen receptor positive (  [place])  03/26/2017 Initial Diagnosis   Palpable right breast mass with a normal mammogram: 8:00 position by ultrasound 3.2 cm irregular mass: Grade 2 invasive lobular cancer ER 85%, PR 100%, Ki-67 2%, HER-2 negative ratio 1.5, T2 N0 stage II a clinical stage   04/20/2017 Genetic Testing   Genetic counseling and testing for hereditary cancer syndromes performed on . Results are negative for pathogenic mutations in 46 genes analyzed by [facility]'s Common Hereditary Cancers Panel. Results are dated . Genes tested: APC, ATM, AXIN2, BARD1, BMPR1A, BRCA1, BRCA2, BRIP1, CDH1, CDKN2A, CHEK2, CTNNA1, DICER1, EPCAM, GREM1, HOXB13, KIT, MEN1, MLH1, MSH2, MSH3, MSH6, MUTYH, NBN, NF1,  NTHL1, PALB2, PDGFRA, PMS2, POLD1, POLE, PTEN, RAD50, RAD51C, RAD51D, SDHA, SDHB, SDHC, SDHD, SMAD4, SMARCA4, STK11, TP53, TSC1, TSC2, and VHL.  Variants of uncertain significance (not clinically actionable) were noted in APC and BRIP1.      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 Anemia   . Anxiety   . Bipolar disorder in full remission ([place])   . CAD in native artery cardiologist--  dr hilty   a. 09/2014 Cath/PCI in setting of [place] - s/p 2.25 x 12 mm Promus Premier DES to mid RCA;  b. 11/2016 Myoview: EF 56%, no ischemia;  c. 12/2016 NSTEMI/PCI: LM nl, LAd 25p, LCX 30p, RCA 100p (2.75x38 Promus Premier DES), mRCA 10 ISR, EF 50-55%.  . CKD (chronic kidney disease), stage III   . Depression   . Genetic testing 04/23/2017   Ms. Chap underwent genetic counseling and testing for hereditary cancer syndromes on 04/05/2017. Her results were negative for mutations in all 46 genes analyzed by [facility]'s 46-gene Common Hereditary Cancers Panel. Genes analyzed include: APC, ATM, AXIN2, BARD1, BMPR1A, BRCA1, BRCA2, BRIP1, CDH1, CDKN2A, CHEK2, CTNNA1, DICER1, EPCAM, GREM1, HOXB13, KIT, MEN1, MLH1, MSH2, MSH3, MSH6, MUTYH, NBN,  . GERD (gastroesophageal reflux disease)   . Headache   . History of external beam radiation therapy    right breast 07-27-2017  to 09-25-2017  . History of non-ST elevation myocardial infarction (NSTEMI)   . HOCM (hypertrophic obstructive cardiomyopathy) ([place]) followed by cardiology   a. 12/2016 Echo: EF 65-70%,  mod conc LVH, dynamic obstruction @ rest,  peak velocity of 291 cm/sec w/ peak gradient of 69mHg, no rwma, Gr1 DD, triv TR, PASP 133mg.  . Marland Kitchenyperlipidemia   . Hypertension   . Malignant neoplasm of lower-outer quadrant of right breast of female, [facility] dr gu[facility]indi Adie dx 06/ 2018--- right breast invasive lobular carcinoma, ductal carcinoma w/ LCIS---- 06-21-2017 s/p right breast lumpecotmy w/ sln disseciton;   completed radiation 09-25-2017;  on tamoxifen  . S/P drug eluting coronary stent placement    09-14-2014---  [facility] 12-[facility]  PCI with DES x1 to [facility]  . [facility] sa[facility]cia Shelling [facility]/ 2017  dx transverse myelitis w/ right side numbness (09-01-2019  currently lower extremity weakness, mucsle spasms, and gait disturbance)  . Wears glasses  PAST SURGICAL HISTORY:  Past Surgical History:  Procedure Laterality Date  . BREAST LUMPECTOMY WITH RADIOACTIVE SEED AND SENTINEL LYMPH NODE BIOPSY Right 06/21/2017   Procedure: RIGHT BREAST BRACKETED SEED GUIDED LUMPECTOMY AND SENTINEL LYMPH NODE BIOPSY;  Surgeon: Jovita Kussmaul, MD;  Location: [place];  Service: General;  Laterality: Right;  . CORONARY/GRAFT ACUTE MI REVASCULARIZATION N/A 12/31/2016   Procedure: Coronary/Graft Acute MI Revascularization;  Surgeon: Sherren Mocha, MD;  Location: [place] [facility];  Service: Cardiovascular;  Laterality: N/A;  . DILATATION & CURETTAGE/HYSTEROSCOPY WITH MYOSURE N/A 09/02/2019   Procedure: DILATATION & CURETTAGE/HYSTEROSCOPY WITH MYOSURE;  Surgeon: Aloha Gell, MD;  Location: [place];  Service: Gynecology;  Laterality: N/A;  . LEFT HEART CATH AND CORONARY ANGIOGRAPHY N/A 12/31/2016   Procedure: Left Heart Cath and Coronary Angiography;  Surgeon: Sherren Mocha, MD;  Location: [place] CV LAB;  Service: Cardiovascular;  Laterality: N/A;  . LEFT HEART CATHETERIZATION WITH CORONARY ANGIOGRAM N/A 09/14/2014   Procedure: LEFT HEART  CATHETERIZATION WITH CORONARY ANGIOGRAM;  Surgeon: Burnell Blanks, MD;  Location: [facility] CATH LAB;  Service: Cardiovascular;  Laterality: N/A;  . PERCUTANEOUS CORONARY STENT INTERVENTION (PCI-S)  09/14/2014   Procedure: PERCUTANEOUS CORONARY STENT INTERVENTION (PCI-S);  Surgeon: Burnell Blanks, MD;  Location: [facility] CATH LAB;  Service: Cardiovascular;;  . TONSILLECTOMY  2005  . UMBILICAL HERNIA REPAIR  2001; 2005    OB/GYN HISTORY:  OB History  No obstetric history on file.  G0  No LMP recorded.  Age at menarche: 84 Age at menopause: N/A Hx of HRT: On tamoxifen Hx of STDs: Denies Last pap: January 2021 per the patient's report History of abnormal pap smears: yes, CIN1 in 2018; also reports a history of a remote abnormal Pap smear that did not require biopsy or cervical procedure  SCREENING STUDIES:  Last mammogram: 03/2019  Last colonoscopy: January 2020 Last bone mineral density: n/a  MEDICATIONS: Outpatient Encounter Medications as of 12/05/2019  Medication Sig  . acetaminophen (TYLENOL) 325 MG tablet Take 325-650 mg by mouth every 8 (eight) hours as needed (for pain or cramping).   Marland Kitchen amLODipine (NORVASC) 10 MG tablet TAKE 1 TABLET(10 MG) BY MOUTH DAILY  . aspirin 81 MG chewable tablet Chew 1 tablet (81 mg total) by mouth daily.  Marland Kitchen atorvastatin (LIPITOR) 80 MG tablet TAKE 1 TABLET(80 MG) BY MOUTH DAILY  . baclofen (LIORESAL) 10 MG tablet Take up to 4/day for muscle spasms (Patient taking differently: Take 10 mg by mouth 4 (four) times daily. Take up to 4/day for muscle spasms)  . carvedilol (COREG) 12.5 MG tablet Take 1 tablet (12.5 mg total) by mouth 2 (two) times daily with a meal.  . ferrous sulfate 325 (65 FE) MG tablet Take 1 tablet (325 mg total) by mouth daily.  . furosemide (LASIX) 20 MG tablet Take 1 tablet (20 mg total) by mouth daily as needed.  . gabapentin (NEURONTIN) 300 MG capsule Take '600mg'$  (3 capsules) by mouth three times daily  . hydrALAZINE  (APRESOLINE) 25 MG tablet TAKE 1 TABLET BY MOUTH THREE TIMES DAILY (Patient taking differently: Take 25 mg by mouth 3 (three) times daily. )  . ibuprofen (ADVIL) 800 MG tablet Take 1 tablet (800 mg total) by mouth every 8 (eight) hours as needed for mild pain.  Marland Kitchen lisinopril (ZESTRIL) 20 MG tablet TAKE 1 TABLET(20 MG) BY MOUTH TWICE DAILY  . nitroGLYCERIN (NITROSTAT) 0.4 MG SL tablet PLACE 1 TABLET UNDER THE TONGUE EVERY 5 MINUTES AS NEEDED  FOR CHEST PAIN  . tamoxifen (NOLVADEX) 20 MG tablet Take 1 tablet (20 mg total) by mouth daily.  . traMADol (ULTRAM) 50 MG tablet TAKE 1 TABLET(50 MG) BY MOUTH TWICE DAILY AS NEEDED   Facility-Administered Encounter Medications as of 12/05/2019  Medication  . gadopentetate dimeglumine (MAGNEVIST) injection 12 mL  . gadopentetate dimeglumine (MAGNEVIST) injection 16 mL    ALLERGIES:  Allergies  Allergen Reactions  . Pork-Derived Products Other (See Comments)    Does NOT eat pork     FAMILY HISTORY:  Family History  Problem Relation Age of Onset  . Diabetes Mother   . Heart disease Mother   . Hyperlipidemia Mother   . Hypertension Mother   . Breast cancer Mother   . Lung cancer Father   . Drug abuse Father   . Brain cancer Father 80  . Bone cancer Father 67  . Drug abuse Sister   . Mental illness Brother   . Mental illness Maternal Grandmother   . Stomach cancer Paternal Grandmother 52       d.75     SOCIAL HISTORY:    Social Connections:   . Frequency of Communication with Friends and Family: Not on file  . Frequency of Social Gatherings with Friends and Family: Not on file  . Attends Religious Services: Not on file  . Active Member of Clubs or Organizations: Not on file  . Attends Archivist Meetings: Not on file  . Marital Status: Not on file    REVIEW OF SYSTEMS:  Pertinent positives as per HPI. Denies appetite changes, fevers, chills, fatigue, unexplained weight changes. Denies hearing loss, neck lumps or masses,  mouth sores, ringing in ears or voice changes. Denies cough or wheezing.  Denies shortness of breath. Denies palpitations. Denies leg swelling. Denies abdominal distention, pain, blood in stools, constipation, diarrhea, nausea, vomiting, or early satiety. Denies pain with intercourse, dysuria, frequency, hematuria or incontinence. Denies hot flashes, vaginal discharge.   Denies joint pain, back pain or muscle pain/cramps. Denies itching, rash, or wounds. Denies dizziness, headaches, numbness or seizures. Denies swollen lymph nodes or glands, denies easy bruising or bleeding. Denies anxiety, depression, confusion, or decreased concentration.  Physical Exam:  Vital Signs for this encounter:  Blood pressure 114/81, pulse 73, temperature 99.1 F (37.3 C), temperature source Temporal, resp. rate 18, weight 150 lb 8 oz (68.3 kg), SpO2 100 %. Body mass index is 28.44 kg/m. General: Alert, oriented, no acute distress.  HEENT: Normocephalic, atraumatic. Sclera anicteric.  Chest: Clear to auscultation bilaterally. No wheezes, rhonchi, or rales. Cardiovascular: Regular rate and rhythm, no murmurs, rubs, or gallops.  Abdomen: Normoactive bowel sounds. Soft, nondistended, nontender to palpation. No masses or hepatosplenomegaly appreciated. No palpable fluid wave.  Extremities: Grossly normal range of motion. Warm, well perfused. No edema bilaterally.  Skin: No rashes or lesions.  Lymphatics: No cervical, supraclavicular, or inguinal adenopathy.  GU:  Normal external female genitalia. No lesions. No discharge or bleeding.             Bladder/urethra:  No lesions or masses, well supported bladder             Vagina: Well rugated, no lesions.             Cervix: Normal appearing, no lesions.             Uterus: 8-10 cm, mobile, no parametrial involvement or nodularity.             Adnexa: No masses  appreciated.  Rectal: Deferred.  LABORATORY AND RADIOLOGIC DATA:  Outside medical records were  reviewed to synthesize the above history, along with the history and physical obtained during the visit.   Lab Results  Component Value Date   WBC 9.2 11/14/2019   HGB 7.3 (L) 11/14/2019   HCT 24.4 (L) 11/14/2019   PLT 422 (H) 11/14/2019   GLUCOSE 99 11/14/2019   CHOL 149 07/09/2018   TRIG 99 07/09/2018   HDL 42 07/09/2018   LDLCALC 87 07/09/2018   ALT 16 07/09/2018   AST 19 07/09/2018   NA 142 11/14/2019   K 3.1 (L) 11/14/2019   CL 110 11/14/2019   CREATININE 1.17 (H) 11/14/2019   BUN 10 11/14/2019   CO2 20 (L) 11/14/2019   TSH 1.648 12/31/2016   INR 1.03 11/12/2015   HGBA1C 5.8 02/22/2016   MICROALBUR 0.8 09/07/2014   12/01/2019: CA-125 - 11 CEA - 3.8 CA 19-9 -11

## 2019-12-15 ENCOUNTER — Encounter: Payer: Self-pay | Admitting: Neurology

## 2019-12-15 ENCOUNTER — Other Ambulatory Visit: Payer: Self-pay

## 2019-12-15 ENCOUNTER — Ambulatory Visit: Payer: 59 | Admitting: Neurology

## 2019-12-15 VITALS — BP 130/82 | HR 90 | Temp 97.9°F | Ht 61.0 in | Wt 153.5 lb

## 2019-12-15 DIAGNOSIS — Z170 Estrogen receptor positive status [ER+]: Secondary | ICD-10-CM

## 2019-12-15 DIAGNOSIS — R0789 Other chest pain: Secondary | ICD-10-CM

## 2019-12-15 DIAGNOSIS — G373 Acute transverse myelitis in demyelinating disease of central nervous system: Secondary | ICD-10-CM | POA: Diagnosis not present

## 2019-12-15 DIAGNOSIS — R208 Other disturbances of skin sensation: Secondary | ICD-10-CM

## 2019-12-15 DIAGNOSIS — C50511 Malignant neoplasm of lower-outer quadrant of right female breast: Secondary | ICD-10-CM | POA: Diagnosis not present

## 2019-12-15 MED ORDER — CELECOXIB 200 MG PO CAPS: 200.0000 mg | ORAL_CAPSULE | Freq: Two times a day (BID) | ORAL | 3 refills | Status: DC

## 2019-12-15 NOTE — Progress Notes (Signed)
[facility]  PATIENT: Anna Henson DOB: 1971-06-16  REFERRING DOCTOR OR PCP:  Rikki Spearing _________________________________   HISTORICAL  CHIEF COMPLAINT:  Chief Complaint  Patient presents with  . Follow-up    RM 12, alone. last seen 02/06/2019.     HISTORY OF PRESENT ILLNESS:  Anna Henson is a 49 yo woman with a transverse myelitis in late January 2017.    Update 12/15/2019: She feels her transverse myelitis symptoms have worsened.    She gets an electrical sensation down her spine and pain that radiates down the back to the rib cage.    She was coughing and pain was worse for a week.    She had a burst like severe pain below the breast on the right like something popped.    She went to the [facility] ED.   CXR showed no osseous pathology or lung pathology.   She has clips form breast surgery on the right.     Sars-Covid2 was negative.      Tramadol and baclofen help the pain some.     She has felt her cognition is worse when pain is worse.    11/12/2015 MRI of cervical spine showed right dorsal column enhancing lesion at cervicomedullary junction but it resolved.     She also has spinal stenosis.    MRI Cervical spine 09/05/2017: IMPRESSION:  This MRI of the cervical spine with and without contrast shows the following: 1.    The spinal cord appears normal.     2.    At C3-C4, there is borderline spinal stenosis but no nerve root compression. 3.    At C4-C5, there is mild left foraminal narrowing due to left paramedian disc protrusion but no nerve root compression. 4.    At C5-C6, there is moderately severe spinal stenosis and moderate right greater than left neural foraminal narrowing. Central disc protrusion in the degree of spinal stenosis has progressed compared to the 02/02/2016 MRI. 5.    At C6-C7, there is a left-sided disc osteophyte complex causing severe left foraminal narrowing and possible left C7 nerve root compression  6.    There is a small 3-4 mm nodule  in the left thyroid gland.  Due to small size it is unlikely to be clinically significant.    Update 02/06/2019 (virtual) She is continuing to have numbness in her legs and feels weakness in her arms, especially when trying to raise them.  The right side is a little worse than her left and she has painful tingling dysesthesias.   Numbness is about the same as in 2018 but much bette than in 2017.  Weakness is about he same.  She feels gait is affected by the spasms in her legs.   She did better when on baclofen.    She still has leg spasticity and pain.  When she gets out of bed, she has the most pian in her legs.   She was on baclofen and tramadol with benefit.     She notes mild urgency but no urinary incontinence.   She has whole body jerks at times when she is drowsy.  She has right breast cancer and had surgery and radiation (no chemo planned).   She will be re-imaged soon.   She has had some lymphedema and did PT for this..      Observations/Objective: She is 163 pounds   127/82  Pulse 73.   She is a well-developed well-nourished woman in no acute  distress.  The head is normocephalic and atraumatic.  Sclera are anicteric.  Visible skin appears normal.  The neck has a good range of motion.  Pharynx and tongue have normal appearance.  She is alert and fully oriented with fluent speech and good attention, knowledge and memory.  Extraocular muscles are intact.  Facial strength is normal.  Palatal elevation and tongue protrusion are midline.  She appears to have normal strength in the arms.  Rapid alternating movements and finger-nose-finger are performed well.   Assessment and Plan: No diagnosis found. 1.  She had idiopathic transverse myelitis.  The MRI appearance is markedly improved to the initial event and she has no evidence of recurrence.   2.  her dysesthesias were better on gabapentin and tramadol and I will prescribe these for her. 3.  Baclofen for spasticity. 4.  Return in 1 year or  sooner if there are new or worsening neurologic symptoms.  Follow Up Instructions: I discussed the assessment and treatment plan with the patient. The patient was provided an opportunity to ask questions and all were answered. The patient agreed with the plan and demonstrated an understanding of the instructions.    The patient was advised to call back or seek an in-person evaluation if the symptoms worsen or if the condition fails to improve as anticipated.  I provided 25 minutes of non-face-to-face time during this encounter.  ________________________________ From previous visits: Update 08/22/2017:   She had a transverse Myelitis involving the spinal cord/brain from medulla to the C5 level.    She is still  noting some neck pain during the day.    She has a rare Lhermitte sign.   When present, the pain is mostly only in her neck now.     She is taking 300 mg x 5 = 1500 mg daily.    She tolerates it well.    She has spasms in her legs, much worse at night than the day.   Spasms [facility]) are on the left more than her right (was flipped last time).   The dysesthesias in her legs are just below her knee.      Spasms affect her quality of sleep.  She goes to bed between 8 and 10 pm and gets up between 2 and 4 am and stays up.    She takes baclofen 10 mg po bid, one at night.     When drowsy, she often has large jerks.  This can occur night or day.     From 03/12/2017:  Transverse myelitis: In January 2017, she had the onset of numbness on the right and was found to have an enhancing cervical spine lesion on the MRI extending from the lower medulla on the right to C5.   Symptoms improved some afterwards.   In April, she reported more numbness and repeat imaging was performed.  The 02/02/16 cervical spine shows resolution of the prior enhancing focus that was seen at the cervicomedullary junction.    Dysesthesia:   She reports a dysesthetic sensation on the right with heat and stabbing and  sometimes gets a milder sensation on the left in the arm.   Gabapentin helps the pain some.    She is on 300-300-600 mg over the day.    She now rarely gets a Lhermitte sign       Spasms:  She is experiencing muscles spasms on both sides, right > left.  She gets a Programmer, applications Horse spasm in the legs, right >  left.   Rarely, she gets them in both legs.   These are worse when she lays down at night.   She gets milder leg spasms in her legs > arms during the day.     She takes baclofen 20 mg at night and it helps reduce spasms some.   She wakes up without grogginess if she takes an hour before bedtime.     Tramadol helps pain but causes a hangover.   Baclofen helps better  Gait/balance/strength    She notes balance is poor at times.  She stumbles some but no recent falls.    The right leg is mildly clumsy.    She notes the right hand trembles some and feels clumsy.   She sometimes drops items and has poor handwriting.  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   She notes typing speed dropped from 65 WPM to 45 WPM since the transverse melitis.    Bladder:   She denies urinary frequency.  No incontinence.  No hesitancy.     Cognitive/mood:   She feels she is not doing as well cognitively.   She was recently diagnosed with bipolar disease and PTSD.    She is on Abilify.     Sleep:   She falls asleep well most nights but always has trouble staying asleep.   She goes to bed around 10-11 pm.  Spasms usually awakens her multiple times, usually between 2 and 5 am.    Cardiac:   She had an MI 12/2016.   She has a RCA blockage and is on Brilinta.   She wore an even monitor and she reports it showed increased HR at night.     Labwork and the lupus anticoagulant and antiphospholipid tests were normal.  Transverse myelitis history:    In late January,  she had a right sided headache and went to bed.  When she woke up, she noted numbness in the right neck, shoulder and arm.   Numbness worsened over the  next fe days.   She went to her PCP an was told she might have sinusitis and Amoxicillin was prescribed.   After a couple more days, she saw another doctor and had a neck soft tissue CT scan.   That CT scan showed that she had an increase of adenoid tissue and reactive type lymph nodes.  By that time, numbness extended to the fingertips on the riht but not into her leg.   She was told to go to the emergency room for further evaluation. In the emergency room she had an MRI of the cervical spine and the brain. The MRI of the cervical spine showed a focus that extended from the lower medulla on the right down to C5 posteriorly and more medially within the spinal cord. She also had some enhancement around the level of the cervical medullary junction. She was admitted to the hospital for further treatment and testing.    She was treated with 5 days of IV Solu-Medrol. During that time, her symptoms of numbness improved.  She has not returned to baseline.    She had many tests performed including lumbar puncture for CSF analysis and a thoracic spine MRI and serology blood tests. For the most part, the evaluation was negative.  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 The enhancement does not extend up to the medulla.  Cervical spine also showed mild spinal stenosis at C3-C4 and mild to moderate spinal stenosis at C5-C6 and mild spinal stenosis at C6-C7. There is some foraminal narrowing at those levels but no definite nerve root compression.  Elsewhere the brain, there were some subcortical and deep white matter T2/FLAIR hyperintense foci but not in a pattern that would be typical for MS. The thoracic spine was normal.    I also reviewed her many laboratory tests performed while she was at [employer]. Neuromyelitis optica antibodies are negative. She did not have oligoclonal bands. ANCA, ANA, SSA/SSB, ACE and Vit B-12 were all normal or negative.  She had  Epstein-Barr IgG but not IgM. CSF cell counts did not show increase in white blood cells.  REVIEW OF SYSTEMS: Constitutional: No fevers, chills, sweats, or change in appetite.  She notes some fatigue Eyes: No visual changes, double vision, eye pain Ear, nose and throat: No hearing loss, ear pain, nasal congestion, sore throat Cardiovascular: No chest pain, palpitations.   H/O angina, CAD with stent Respiratory: No shortness of breath at rest or with exertion.   No wheezes GastrointestinaI: No nausea, vomiting, diarrhea, abdominal pain, fecal incontinence Genitourinary: Mild urgency and frequency.  No nocturia. Musculoskeletal: No neck pain, back pain Integumentary: No rash, pruritus, skin lesions Neurological: as above Psychiatric: No depression at this time.  No anxiety Endocrine: No palpitations, diaphoresis, change in appetite, change in weigh or increased thirst Hematologic/Lymphatic: No anemia, purpura, petechiae. Allergic/Immunologic: No itchy/runny eyes, nasal congestion, recent allergic reactions, rashes  ALLERGIES: Allergies  Allergen Reactions  . Pork-Derived Products Other (See Comments)    Does NOT eat pork    HOME MEDICATIONS:  Current Outpatient Medications:  .  acetaminophen (TYLENOL) 325 MG tablet, Take 325-650 mg by mouth every 8 (eight) hours as needed (for pain or cramping). , Disp: , Rfl:  .  amLODipine (NORVASC) 10 MG tablet, TAKE 1 TABLET(10 MG) BY MOUTH DAILY, Disp: 90 tablet, Rfl: 2 .  aspirin 81 MG chewable tablet, Chew 1 tablet (81 mg total) by mouth daily., Disp: , Rfl:  .  atorvastatin (LIPITOR) 80 MG tablet, TAKE 1 TABLET(80 MG) BY MOUTH DAILY, Disp: 90 tablet, Rfl: 1 .  baclofen (LIORESAL) 10 MG tablet, Take up to 4/day for muscle spasms (Patient taking differently: Take 10 mg by mouth 4 (four) times daily. Take up to 4/day for muscle spasms), Disp: 120 each, Rfl: 11 .  carvedilol (COREG) 12.5 MG tablet, Take 1 tablet (12.5 mg total) by mouth 2 (two)  times daily with a meal., Disp: 180 tablet, Rfl: 3 .  ferrous sulfate 325 (65 FE) MG tablet, Take 1 tablet (325 mg total) by mouth daily., Disp: 30 tablet, Rfl: 0 .  furosemide (LASIX) 20 MG tablet, Take 1 tablet (20 mg total) by mouth daily as needed., Disp: 90 tablet, Rfl: 0 .  gabapentin (NEURONTIN) 300 MG capsule, Take '600mg'$  (3 capsules) by mouth three times daily, Disp: 270 capsule, Rfl: 3 .  hydrALAZINE (APRESOLINE) 25 MG tablet, TAKE 1 TABLET BY MOUTH THREE TIMES DAILY (Patient taking differently: Take 25 mg by mouth 3 (three) times daily. ), Disp: 270 tablet, Rfl: 1 .  ibuprofen (ADVIL) 800 MG tablet, Take 1 tablet (800 mg total) by mouth every 8 (eight) hours as needed for mild pain., Disp: 30 tablet, Rfl: 1 .  lisinopril (ZESTRIL) 20 MG tablet, TAKE 1 TABLET(20 MG) BY MOUTH TWICE DAILY, Disp: 180 tablet, Rfl: 2 .  nitroGLYCERIN (NITROSTAT) 0.4 MG SL tablet,  PLACE 1 TABLET UNDER THE TONGUE EVERY 5 MINUTES AS NEEDED FOR CHEST PAIN, Disp: 25 tablet, Rfl: 4 .  tamoxifen (NOLVADEX) 20 MG tablet, Take 1 tablet (20 mg total) by mouth daily., Disp: 90 tablet, Rfl: 3 .  traMADol (ULTRAM) 50 MG tablet, TAKE 1 TABLET(50 MG) BY MOUTH TWICE DAILY AS NEEDED, Disp: 60 tablet, Rfl: 5 No current facility-administered medications for this visit.  Facility-Administered Medications Ordered in Other Visits:  .  gadopentetate dimeglumine (MAGNEVIST) injection 12 mL, 12 mL, Intravenous, Once PRN, Novalynn Branaman A, MD .  gadopentetate dimeglumine (MAGNEVIST) injection 16 mL, 16 mL, Intravenous, Once PRN, Benedetto Ryder, Nanine Means, MD  PAST MEDICAL HISTORY: Past Medical History:  Diagnosis Date  . Anemia   . Anxiety   . Bipolar disorder in full remission ([place])   . CAD in [place] artery cardiologist--  dr hilty   a. 09/2014 Cath/PCI in setting of [place] - s/p 2.25 x 12 mm Promus Premier DES to mid RCA;  b. 11/2016 Myoview: EF 56%, no ischemia;  c. 12/2016 NSTEMI/PCI: LM nl, LAd 25p, LCX 30p, RCA 100p (2.75x38 Promus Premier  DES), mRCA 10 ISR, EF 50-55%.  . CKD (chronic kidney disease), stage III   . Depression   . Genetic testing 04/23/2017   Ms. Boehringer underwent genetic counseling and testing for hereditary cancer syndromes on 04/05/2017. Her results were negative for mutations in all 46 genes analyzed by [facility]'s 46-gene Common Hereditary Cancers Panel. Genes analyzed include: APC, ATM, AXIN2, BARD1, BMPR1A, BRCA1, BRCA2, BRIP1, CDH1, CDKN2A, CHEK2, CTNNA1, DICER1, EPCAM, GREM1, HOXB13, KIT, MEN1, MLH1, MSH2, MSH3, MSH6, MUTYH, NBN,  . GERD (gastroesophageal reflux disease)   . Headache   . History of external beam radiation therapy    right breast 07-27-2017  to 09-25-2017  . History of non-ST elevation myocardial infarction (NSTEMI)   . HOCM (hypertrophic obstructive cardiomyopathy) ([place]) followed by cardiology   a. 12/2016 Echo: EF 65-70%,  mod conc LVH, dynamic obstruction @ rest, peak velocity of 291 cm/sec w/ peak gradient of 26mHg, no rwma, Gr1 DD, triv TR, PASP 194mg.  . Marland Kitchenyperlipidemia   . Hypertension   . Malignant neoplasm of lower-outer quadrant of right breast of female, estrogen receptor positive [facility] dr gu[facility]indi Adie dx 06/ 2018--- right breast invasive lobular carcinoma, ductal carcinoma w/ LCIS---- 06-21-2017 s/p right breast lumpecotmy w/ sln disseciton;   completed radiation 09-25-2017;  on tamoxifen  . S/P drug eluting coronary stent placement    09-14-2014---  [facility]  PCI with DES x1 to proxRCA  . [facility] dr sa[facility]lecia Shelling 01/ 2017  dx transverse myelitis w/ right side numbness (09-01-2019  currently lower extremity weakness, mucsle spasms, and gait disturbance)  . Wears glasses     PAST SURGICAL HISTORY: Past Surgical History:  Procedure Laterality Date  . BREAST LUMPECTOMY WITH RADIOACTIVE SEED AND SENTINEL LYMPH NODE BIOPSY Right 06/21/2017   Procedure: RIGHT BREAST BRACKETED SEED GUIDED LUMPECTOMY AND SENTINEL LYMPH  NODE BIOPSY;  Surgeon: ToJovita KussmaulMD;  [place] Service: General;  Laterality: Right;  . CORONARY/GRAFT ACUTE MI REVASCULARIZATION N/A 12/31/2016   Procedure: Coronary/Graft Acute MI Revascularization;  Surgeon: MiSherren MochaMD;  [facility];  Service: Cardiovascular;  Laterality: N/A;  . DILATATION & CURETTAGE/HYSTEROSCOPY WITH MYOSURE N/A 09/02/2019   Procedure: DILATATION & CURETTAGE/HYSTEROSCOPY WITH MYOSURE;  Surgeon: FoAloha GellMD;  [place] [facility] Service: Gynecology;  Laterality: N/A;  . LEFT HEART CATH AND CORONARY ANGIOGRAPHY N/A 12/31/2016   Procedure: Left Heart Cath and Coronary Angiography;  Surgeon: Sherren Mocha, MD;  Location: [place] CV LAB;  Service: Cardiovascular;  Laterality: N/A;  . LEFT HEART CATHETERIZATION WITH CORONARY ANGIOGRAM N/A 09/14/2014   Procedure: LEFT HEART CATHETERIZATION WITH CORONARY ANGIOGRAM;  Surgeon: Burnell Blanks, MD;  Location: [facility] CATH LAB;  Service: Cardiovascular;  Laterality: N/A;  . PERCUTANEOUS CORONARY STENT INTERVENTION (PCI-S)  09/14/2014   Procedure: PERCUTANEOUS CORONARY STENT INTERVENTION (PCI-S);  Surgeon: Burnell Blanks, MD;  [facility];  Service: Cardiovascular;;  . TONSILLECTOMY  2005  . UMBILICAL HERNIA REPAIR  2001; 2005    FAMILY HISTORY: Family History  Problem Relation Age of Onset  . Diabetes Mother   . Heart disease Mother   . Hyperlipidemia Mother   . Hypertension Mother   . Breast cancer Mother   . Lung cancer Father   . Drug abuse Father   . Brain cancer Father 57  . Bone cancer Father 23  . Drug abuse Sister   . Mental illness Brother   . Mental illness Maternal Grandmother   . Stomach cancer Paternal Grandmother 26       d.75    SOCIAL HISTORY:  Social History   Socioeconomic History  . Marital status: Single    Spouse name: Not on file  . Number of children: 0  . Years of education: masters  . Highest education  level: Not on file  Occupational History  . Occupation: Therapist, art at a call center    Employer: [employer]  Tobacco Use  . Smoking status: Former Smoker    Packs/day: 0.50    Years: 30.00    Pack years: 15.00    Types: Cigarettes    Quit date: 10/05/2015    Years since quitting: 4.1  . Smokeless tobacco: Never Used  Substance and Sexual Activity  . Alcohol use: Yes    Alcohol/week: 2.0 standard drinks    Types: 2 Cans of beer per week  . Drug use: No  . Sexual activity: Not Currently  Other Topics Concern  . Not on file  Social History Narrative   Consumes 2 cups of caffeine daily   Social Determinants of Health   Financial Resource Strain:   . Difficulty of Paying Living Expenses: Not on file  Food Insecurity:   . Worried About Charity fundraiser in the Last Year: Not on file  . Ran Out of Food in the Last Year: Not on file  Transportation Needs:   . Lack of Transportation (Medical): Not on file  . Lack of Transportation (Non-Medical): Not on file  Physical Activity:   . Days of Exercise per Week: Not on file  . Minutes of Exercise per Session: Not on file  Stress:   . Feeling of Stress : Not on file  Social Connections:   . Frequency of Communication with Friends and Family: Not on file  . Frequency of Social Gatherings with Friends and Family: Not on file  . Attends Religious Services: Not on file  . Active Member of Clubs or Organizations: Not on file  . Attends Archivist Meetings: Not on file  . Marital Status: Not on file  Intimate Partner Violence:   . Fear of Current or Ex-Partner: Not on file  . Emotionally Abused: Not on file  . Physically Abused: Not on file  . Sexually Abused: Not on file  PHYSICAL EXAM  Vitals:   12/15/19 1302  BP: 130/82  Pulse: 90  Temp: 97.9 F (36.6 C)  SpO2: 97%  Weight: 153 lb 8 oz (69.6 kg)  Height: '5\' 1"'$  (1.549 m)    Body mass index is 29 kg/m.   General: The patient is well-developed and  well-nourished and in no acute distress  Neurologic Exam  Mental status: The patient is alert and oriented x 3 at the time of the examination. The patient has apparent normal recent and remote memory, with an apparently normal attention span and concentration ability.   Speech is normal.  Cranial nerves: Extraocular movements are full.   Facial strength and sensation is normal. Trapezius strength is normal    No obvious hearing deficits are noted.  Motor:  Muscle bulk is normal.  Tone is increased in legs, right greater than left. Strength is 5/5.  Sensory: Touch and vibration are more symmetric now..     Gait and station: Station is normal.   Gait is spastic (right) and mildly wide. Tandem gait is mildly wide.    Reflexes: Deep tendon reflexes are symmetric and increased in legs, right greater than left. There is spread, right greater than left. 2 beats of nonsustained ankle clonus on the right.   DIAGNOSTIC DATA (LABS, IMAGING, TESTING) - I reviewed patient records, labs, notes, testing and imaging myself where available.  Lab Results  Component Value Date   WBC 9.2 11/14/2019   HGB 7.3 (L) 11/14/2019   HCT 24.4 (L) 11/14/2019   MCV 73.1 (L) 11/14/2019   PLT 422 (H) 11/14/2019      Component Value Date/Time   NA 142 11/14/2019 1715   NA 140 07/09/2018 1509   NA 139 04/04/2017 0835   K 3.1 (L) 11/14/2019 1715   K 3.6 04/04/2017 0835   CL 110 11/14/2019 1715   CO2 20 (L) 11/14/2019 1715   CO2 25 04/04/2017 0835   GLUCOSE 99 11/14/2019 1715   GLUCOSE 83 04/04/2017 0835   BUN 10 11/14/2019 1715   BUN 14 07/09/2018 1509   BUN 13.9 04/04/2017 0835   CREATININE 1.17 (H) 11/14/2019 1715   CREATININE 1.19 (H) 04/01/2018 1015   CREATININE 1.3 (H) 04/04/2017 0835   CALCIUM 9.0 11/14/2019 1715   CALCIUM 10.0 04/04/2017 0835   PROT 7.7 07/09/2018 1509   PROT 6.7 04/04/2017 0835   ALBUMIN 4.8 07/09/2018 1509   ALBUMIN 3.9 04/04/2017 0835   AST 19 07/09/2018 1509   AST 18  04/01/2018 1015   AST 23 04/04/2017 0835   ALT 16 07/09/2018 1509   ALT 11 04/01/2018 1015   ALT 22 04/04/2017 0835   ALKPHOS 71 07/09/2018 1509   ALKPHOS 69 04/04/2017 0835   BILITOT 0.2 07/09/2018 1509   BILITOT <0.2 (L) 04/01/2018 1015   BILITOT <0.22 04/04/2017 0835   GFRNONAA 55 (L) 11/14/2019 1715   GFRNONAA 54 (L) 04/01/2018 1015   GFRNONAA 56 (L) 11/24/2015 1113   GFRAA >60 11/14/2019 1715   GFRAA >60 04/01/2018 1015   GFRAA 65 11/24/2015 1113   Lab Results  Component Value Date   CHOL 149 07/09/2018   HDL 42 07/09/2018   LDLCALC 87 07/09/2018   TRIG 99 07/09/2018   CHOLHDL 3.5 07/09/2018   Lab Results  Component Value Date   HGBA1C 5.8 02/22/2016   Lab Results  Component Value Date   VITAMINB12 541 11/12/2015   Lab Results  Component Value Date   TSH 1.648 12/31/2016  ASSESSMENT AND PLAN  Transverse myelitis ([place]) - Plan: MR CERVICAL SPINE W WO CONTRAST, MR THORACIC SPINE W WO CONTRAST  Dysesthesia - Plan: MR CERVICAL SPINE W WO CONTRAST, MR THORACIC SPINE W WO CONTRAST  Other chest pain - Plan: MR THORACIC SPINE W WO CONTRAST  Malignant neoplasm of lower-outer quadrant of right breast of female, estrogen receptor positive ([place]) - Plan: MR CERVICAL SPINE W WO CONTRAST, MR THORACIC SPINE W WO CONTRAST   1.   Due to the new neurologic symptoms, we need to check an MRI of the cervical and thoracic spine to determine if there has been an additional transverse myelitis or if there or degenerative changes causing nerve root or spinal cord compression. 2.   Add Celebrex for her pain.  She is on aspirin. 3.    Stay active and try to exercise 4.    Return in 6 months or sooner if there are new or worsening neurologic symptoms.   Juanice Warburton A. Felecia Shelling, MD, PhD 10/10/9415, 4:08 PM Certified in Neurology, Clinical Neurophysiology, Sleep Medicine, Pain Medicine and Neuroimaging  [facility] [address] [place], [place]  [address] [phone]

## 2019-12-22 ENCOUNTER — Telehealth: Payer: Self-pay | Admitting: Neurology

## 2019-12-22 MED ORDER — ALPRAZOLAM 0.5 MG PO TABS: ORAL_TABLET | ORAL | 0 refills | Status: DC

## 2019-12-22 NOTE — Addendum Note (Signed)
Addended by: Arlice Colt A on: 12/22/2019 11:58 AM   Modules accepted: Orders

## 2019-12-22 NOTE — Telephone Encounter (Signed)
no to the covid questions MR Cervical spine w/wo contrast & MR Thoracic spine w/wo contrast Dr. Felecia Shelling UHC Auth: [place] website. Patient is scheduled at [facility] for 12/24/19.  She also informed me she is claustrophobic and would like something to help her.. she is aware to have a driver.

## 2019-12-22 NOTE — Addendum Note (Signed)
Addended by: Wyvonnia Lora on: 12/22/2019 08:26 AM   Modules accepted: Orders

## 2019-12-24 ENCOUNTER — Ambulatory Visit: Payer: 59

## 2019-12-24 DIAGNOSIS — R0789 Other chest pain: Secondary | ICD-10-CM | POA: Diagnosis not present

## 2019-12-24 DIAGNOSIS — Z170 Estrogen receptor positive status [ER+]: Secondary | ICD-10-CM | POA: Diagnosis not present

## 2019-12-24 DIAGNOSIS — G373 Acute transverse myelitis in demyelinating disease of central nervous system: Secondary | ICD-10-CM

## 2019-12-24 DIAGNOSIS — R208 Other disturbances of skin sensation: Secondary | ICD-10-CM

## 2019-12-24 DIAGNOSIS — C50511 Malignant neoplasm of lower-outer quadrant of right female breast: Secondary | ICD-10-CM

## 2019-12-24 MED ORDER — GADOBENATE DIMEGLUMINE 529 MG/ML IV SOLN
15.0000 mL | Freq: Once | INTRAVENOUS | Status: AC | PRN
  Administered 2019-12-24: 15 mL via INTRAVENOUS

## 2019-12-25 ENCOUNTER — Telehealth: Payer: Self-pay | Admitting: Neurology

## 2019-12-25 DIAGNOSIS — M4802 Spinal stenosis, cervical region: Secondary | ICD-10-CM

## 2019-12-25 DIAGNOSIS — R208 Other disturbances of skin sensation: Secondary | ICD-10-CM

## 2019-12-25 DIAGNOSIS — R29818 Other symptoms and signs involving the nervous system: Secondary | ICD-10-CM

## 2019-12-25 NOTE — Telephone Encounter (Signed)
I spoke to [facility] about the MRI results  The MRI of the cervical spine looks about the same as her last one.  The spinal cord looks good (no evidence of transverse myelitis signal she had in the past).    She does have moderate spinal stenosis at [facility] and milder spinal stenosis at [facility].  The degenerative changes may be putting pressure on the left C7 nerve root.      The MRI of the thoracic spine showed some degenerative changes, mostly mild, and there was no nerve root compression or spinal stenosis  Since she is not getting any better we willrefer to Neurosurgery to see

## 2020-01-01 ENCOUNTER — Telehealth: Payer: 59 | Admitting: Neurology

## 2020-01-08 ENCOUNTER — Other Ambulatory Visit: Payer: Self-pay | Admitting: *Deleted

## 2020-01-08 MED ORDER — OXCARBAZEPINE 150 MG PO TABS: ORAL_TABLET | ORAL | 5 refills | Status: DC

## 2020-01-14 NOTE — Progress Notes (Signed)
Gynecologic Oncology Telehealth Consult Note: Gyn-Onc  I connected with Anna Henson on 01/14/20 at  1:00 PM EDT by telephone and verified that I am speaking with the correct person using two identifiers.  I discussed the limitations, risks, security and privacy concerns of performing an evaluation and management service by telemedicine and the availability of in-person appointments. I also discussed with the patient that there may be a patient responsible charge related to this service. The patient expressed understanding and agreed to proceed.  Other persons participating in the visit and their role in the encounter: none.  Patient's location: home Provider's location: [facility]  Reason for Visit: Follow-up ultrasound and discussion regarding possible surgery  Treatment History: Oncology History  Malignant neoplasm of lower-outer quadrant of right breast of female, estrogen receptor positive ([place])  03/26/2017 Initial Diagnosis   Palpable right breast mass with a normal mammogram: 8:00 position by ultrasound 3.2 cm irregular mass: Grade 2 invasive lobular cancer ER 85%, PR 100%, Ki-67 2%, HER-2 negative ratio 1.5, T2 N0 stage II a clinical stage   04/20/2017 Genetic Testing   Genetic counseling and testing for hereditary cancer syndromes performed on . Results are negative for pathogenic mutations in 46 genes analyzed by [facility]'s Common Hereditary Cancers Panel. Results are dated . Genes tested: APC, ATM, AXIN2, BARD1, BMPR1A, BRCA1, BRCA2, BRIP1, CDH1, CDKN2A, CHEK2, CTNNA1, DICER1, EPCAM, GREM1, HOXB13, KIT, MEN1, MLH1, MSH2, MSH3, MSH6, MUTYH, NBN, NF1, NTHL1, PALB2, PDGFRA, PMS2, POLD1, POLE, PTEN, RAD50, RAD51C, RAD51D, SDHA, SDHB, SDHC, SDHD, SMAD4, SMARCA4, STK11, TP53, TSC1, TSC2, and VHL.  Variants of uncertain significance (not clinically actionable) were noted in APC and BRIP1.      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  She is having some cramping as she is about to start her menses.  She denies any other pelvic pain.  She denies any change to her bleeding.  She continues to have a lot going on with family.  Past Medical/Surgical History: Past Medical History:  Diagnosis Date  . Anemia   . Anxiety   . Bipolar disorder in full remission ([place])   . CAD in native artery cardiologist--  dr hilty   a. 09/2014 Cath/PCI in setting of [place] - s/p 2.25 x 12 mm Promus Premier DES to mid RCA;  b. 11/2016 Myoview: EF 56%, no ischemia;  c. 12/2016 NSTEMI/PCI: LM nl, LAd 25p, LCX 30p, RCA 100p (2.75x38 Promus Premier DES), mRCA 10 ISR, EF 50-55%.  . CKD (chronic kidney disease), stage III   . Depression   . Genetic testing 04/23/2017   Ms. Solis underwent genetic counseling and testing for hereditary cancer syndromes on 04/05/2017. Her results were negative for mutations in all 46 genes analyzed by [facility]'s 46-gene Common Hereditary Cancers Panel. Genes analyzed include: APC, ATM, AXIN2, BARD1, BMPR1A, BRCA1, BRCA2, BRIP1, CDH1, CDKN2A, CHEK2, CTNNA1, DICER1, EPCAM, GREM1, HOXB13, KIT, MEN1, MLH1, MSH2, MSH3, MSH6, MUTYH, NBN,  . GERD (gastroesophageal reflux disease)   . Headache   . History of external beam radiation therapy  right breast 07-27-2017  to 09-25-2017  . History of non-ST elevation myocardial infarction (NSTEMI)   . HOCM (hypertrophic obstructive cardiomyopathy) ([place]) followed by cardiology   a. 12/2016 Echo: EF 65-70%,  mod conc LVH, dynamic obstruction @ rest, peak velocity of 291 cm/sec w/ peak gradient of 24mHg, no rwma, Gr1 DD, triv TR, PASP  111mg.  . Marland Kitchenyperlipidemia   . Hypertension   . Malignant neoplasm of lower-outer quadrant of right breast of female, [facility] gu[facility]indi Adie dx 06/ 2018--- right breast invasive lobular carcinoma, ductal carcinoma w/ LCIS---- 06-21-2017 s/p right breast lumpecotmy w/ sln disseciton;   completed radiation 09-25-2017;  on tamoxifen  . S/P drug eluting coronary stent placement    09-14-2014---  [facility]  PCI with DES x1 to proxRCA  . [facility] dr sa[facility]lecia Shelling 01/ 2017  dx transverse myelitis w/ right side numbness (09-01-2019  currently lower extremity weakness, mucsle spasms, and gait disturbance)  . Wears glasses     Past Surgical History:  Procedure Laterality Date  . BREAST LUMPECTOMY WITH RADIOACTIVE SEED AND SENTINEL LYMPH NODE BIOPSY Right 06/21/2017   Procedure: RIGHT BREAST BRACKETED SEED GUIDED LUMPECTOMY AND SENTINEL LYMPH NODE BIOPSY;  Surgeon: ToJovita KussmaulMD;  [facility] Service: General;  Laterality: Right;  . CORONARY/GRAFT ACUTE MI REVASCULARIZATION N/A 12/31/2016   Procedure: Coronary/Graft Acute MI Revascularization;  Surgeon: MiSherren MochaMD;  [facility];  Service: Cardiovascular;  Laterality: N/A;  . DILATATION & CURETTAGE/HYSTEROSCOPY WITH MYOSURE N/A 09/02/2019   Procedure: DILATATION & CURETTAGE/HYSTEROSCOPY WITH MYOSURE;  Surgeon: FoAloha GellMD;  [place] [facility]: [place]cology;  Laterality: N/A;  . LEFT HEART CATH AND CORONARY ANGIOGRAPHY N/A 12/31/2016   Procedure: Left Heart Cath and Coronary Angiography;  Surgeon: MiSherren MochaMD;  [place] [facility];  Service: Cardiovascular;  Laterality: N/A;  . LEFT HEART CATHETERIZATION WITH CORONARY ANGIOGRAM N/A 09/14/2014   Procedure: LEFT HEART CATHETERIZATION WITH CORONARY ANGIOGRAM;  Surgeon: ChBurnell BlanksMD;  [facility];  Service:  Cardiovascular;  Laterality: N/A;  . PERCUTANEOUS CORONARY STENT INTERVENTION (PCI-S)  09/14/2014   Procedure: PERCUTANEOUS CORONARY STENT INTERVENTION (PCI-S);  Surgeon: ChBurnell BlanksMD;  [facility];  Service: Cardiovascular;;  . TONSILLECTOMY  2005  . UMBILICAL HERNIA REPAIR  2001; 2005    Family History  Problem Relation Age of Onset  . Diabetes Mother   . Heart disease Mother   . Hyperlipidemia Mother   . Hypertension Mother   . Breast cancer Mother   . Lung cancer Father   . Drug abuse Father   . Brain cancer Father [id number]. Bone cancer Father 7358. Drug abuse Sister   . Mental illness Brother   . Mental illness Maternal Grandmother   . Stomach cancer Paternal Grandmother [id number]     d.75    Social History   Socioeconomic History  . Marital status: Single    Spouse name: Not on file  . Number of children: 0  . Years of education: masters  . Highest education level: Not on file  Occupational History  . Occupation: CuTherapist, artt a call center    Employer: [employer]  Tobacco Use  . Smoking status: Former Smoker    Packs/day: 0.50    Years: 30.00    Pack years: 15.00  Types: Cigarettes    Quit date: 10/05/2015    Years since quitting: 4.2  . Smokeless tobacco: Never Used  Substance and Sexual Activity  . Alcohol use: Yes    Alcohol/week: 2.0 standard drinks    Types: 2 Cans of beer per week  . Drug use: No  . Sexual activity: Not Currently  Other Topics Concern  . Not on file  Social History Narrative   Consumes 2 cups of caffeine daily   Social Determinants of Health   Financial Resource Strain:   . Difficulty of Paying Living Expenses:   Food Insecurity:   . Worried About Charity fundraiser in the Last Year:   . Arboriculturist in the Last Year:   Transportation Needs:   . Film/video editor (Medical):   Marland Kitchen Lack of Transportation (Non-Medical):   Physical Activity:   . Days of Exercise per Week:   . Minutes of Exercise  per Session:   Stress:   . Feeling of Stress :   Social Connections:   . Frequency of Communication with Friends and Family:   . Frequency of Social Gatherings with Friends and Family:   . Attends Religious Services:   . Active Member of Clubs or Organizations:   . Attends Archivist Meetings:   Marland Kitchen Marital Status:     Current Medications:  Current Outpatient Medications:  .  acetaminophen (TYLENOL) 325 MG tablet, Take 325-650 mg by mouth every 8 (eight) hours as needed (for pain or cramping). , Disp: , Rfl:  .  ALPRAZolam (XANAX) 0.5 MG tablet, Take 1-2 tablets 30-33mn prior to MRI. Can take additional tab at time of test if needed. Must have driver, can cause drowsiness., Disp: 3 tablet, Rfl: 0 .  amLODipine (NORVASC) 10 MG tablet, TAKE 1 TABLET(10 MG) BY MOUTH DAILY, Disp: 90 tablet, Rfl: 2 .  aspirin 81 MG chewable tablet, Chew 1 tablet (81 mg total) by mouth daily., Disp: , Rfl:  .  atorvastatin (LIPITOR) 80 MG tablet, TAKE 1 TABLET(80 MG) BY MOUTH DAILY, Disp: 90 tablet, Rfl: 1 .  baclofen (LIORESAL) 10 MG tablet, Take up to 4/day for muscle spasms (Patient taking differently: Take 10 mg by mouth 4 (four) times daily. Take up to 4/day for muscle spasms), Disp: 120 each, Rfl: 11 .  carvedilol (COREG) 12.5 MG tablet, Take 1 tablet (12.5 mg total) by mouth 2 (two) times daily with a meal., Disp: 180 tablet, Rfl: 3 .  celecoxib (CELEBREX) 200 MG capsule, Take 1 capsule (200 mg total) by mouth 2 (two) times daily., Disp: 30 capsule, Rfl: 3 .  ferrous sulfate 325 (65 FE) MG tablet, Take 1 tablet (325 mg total) by mouth daily., Disp: 30 tablet, Rfl: 0 .  furosemide (LASIX) 20 MG tablet, Take 1 tablet (20 mg total) by mouth daily as needed., Disp: 90 tablet, Rfl: 0 .  gabapentin (NEURONTIN) 300 MG capsule, Take 6029m(3 capsules) by mouth three times daily, Disp: 270 capsule, Rfl: 3 .  hydrALAZINE (APRESOLINE) 25 MG tablet, TAKE 1 TABLET BY MOUTH THREE TIMES DAILY (Patient taking  differently: Take 25 mg by mouth 3 (three) times daily. ), Disp: 270 tablet, Rfl: 1 .  lisinopril (ZESTRIL) 20 MG tablet, TAKE 1 TABLET(20 MG) BY MOUTH TWICE DAILY, Disp: 180 tablet, Rfl: 2 .  nitroGLYCERIN (NITROSTAT) 0.4 MG SL tablet, PLACE 1 TABLET UNDER THE TONGUE EVERY 5 MINUTES AS NEEDED FOR CHEST PAIN, Disp: 25 tablet, Rfl: 4 .  OXcarbazepine (  TRILEPTAL) 150 MG tablet, Take 1 tablet in the morning and 2 tablets in the evening by mouth, Disp: 90 tablet, Rfl: 5 .  tamoxifen (NOLVADEX) 20 MG tablet, Take 1 tablet (20 mg total) by mouth daily., Disp: 90 tablet, Rfl: 3 .  traMADol (ULTRAM) 50 MG tablet, TAKE 1 TABLET(50 MG) BY MOUTH TWICE DAILY AS NEEDED, Disp: 60 tablet, Rfl: 5  Review of Symptoms: Pertinent positives per HPI, otherwise negative  Physical Exam: There were no vitals taken for this visit. Not performed given limitations of phone visit  Laboratory & Radiologic Studies: Pelvic ultrasound on 4/8: Uterus Measurements: 10.8 x 4.0 x 5.5 cm = volume: 126 mL. Anteverted. Multiple small uterine leiomyomata identified. Largest of these measure 2.0 cm greatest size at anterior uterus, 1.2 cm greatest size at posterior uterus, and at 1.7 cm at upper uterus. Endometrium Thickness: 16 mm.  No endometrial fluid or focal abnormality Right ovary Measurements: 4.0 x 1.2 x 2.7 cm = volume: 6.8 mL. Normal morphology without mass Left ovary Measurements: 4.3 x 2.2 x 3.1 cm = volume: 15.6 mL. Small hemorrhagic corpus luteal cyst 3.0 x 2.0 x 2.3 cm. No additional masses. Other findings Trace free pelvic fluid.  No additional adnexal masses. IMPRESSION: Multiple small uterine leiomyomata. Small hemorrhagic corpus luteal cyst of the LEFT ovary. No RIGHT ovarian abnormalities identified.  Assessment & Plan: Anna Henson is a 49 y.o. woman with a personal history of estrogen receptor positive breast cancer, currently on tamoxifen.  Reviewed ultrasound findings.  Most recently described  complex right ovarian mass has resolved.  Patient has a normal-appearing small hemorrhagic corpus luteal cyst on the left.  She is very happy to hear this news.  Reviewed again benefits, risks, and alternatives of surgical ovarian ablation.  She is currently on tamoxifen and we discussed that with ovarian suppression or surgical removal, she could probably transition to an aromatase inhibitor, removing the risk associated with tamoxifen of endometrial overgrowth, hyperplasia, and malignancy.  I have encouraged the patient to reach out to her medical oncologist.  She still has a significant amount going on with her family and would prefer to delay surgery at this time.  I offered to schedule her follow-up appointment versus having her call me if and when she is ready to schedule surgery in the future.  After discussion today, the patient sounds most interested in BSO which I think is reasonable if we pursue surgery.  I discussed the assessment and treatment plan with the patient. The patient was provided with an opportunity to ask questions and all were answered. The patient agreed with the plan and demonstrated an understanding of the instructions.   The patient was advised to call back or see an in-person evaluation if the symptoms worsen or if the condition fails to improve as anticipated.   20 minutes of total time was spent for this patient encounter, including preparation, face-to-face counseling with the patient and coordination of care, and documentation of the encounter.   Jeral Pinch, MD  Division of Gynecologic Oncology  Department of Obstetrics and Gynecology  [facility]

## 2020-01-15 ENCOUNTER — Other Ambulatory Visit: Payer: Self-pay

## 2020-01-15 ENCOUNTER — Ambulatory Visit (HOSPITAL_COMMUNITY)
Admission: RE | Admit: 2020-01-15 | Discharge: 2020-01-15 | Disposition: A | Discharge status: Home / Self Care | Payer: 59 | Source: Ambulatory Visit | Attending: Gynecologic Oncology | Admitting: Gynecologic Oncology

## 2020-01-15 DIAGNOSIS — N83291 Other ovarian cyst, right side: Secondary | ICD-10-CM | POA: Diagnosis not present

## 2020-01-16 ENCOUNTER — Encounter: Payer: Self-pay | Admitting: Gynecologic Oncology

## 2020-01-16 ENCOUNTER — Inpatient Hospital Stay: Payer: 59 | Attending: Hematology and Oncology | Admitting: Gynecologic Oncology

## 2020-01-16 DIAGNOSIS — Z1502 Genetic susceptibility to malignant neoplasm of ovary: Secondary | ICD-10-CM | POA: Insufficient documentation

## 2020-01-16 DIAGNOSIS — Z87891 Personal history of nicotine dependence: Secondary | ICD-10-CM | POA: Insufficient documentation

## 2020-01-16 DIAGNOSIS — N83201 Unspecified ovarian cyst, right side: Secondary | ICD-10-CM | POA: Diagnosis not present

## 2020-01-16 DIAGNOSIS — C50511 Malignant neoplasm of lower-outer quadrant of right female breast: Secondary | ICD-10-CM | POA: Diagnosis not present

## 2020-01-16 DIAGNOSIS — Z170 Estrogen receptor positive status [ER+]: Secondary | ICD-10-CM | POA: Diagnosis not present

## 2020-01-16 DIAGNOSIS — Z79810 Long term (current) use of selective estrogen receptor modulators (SERMs): Secondary | ICD-10-CM | POA: Insufficient documentation

## 2020-01-16 DIAGNOSIS — Z923 Personal history of irradiation: Secondary | ICD-10-CM | POA: Insufficient documentation

## 2020-01-16 NOTE — Addendum Note (Signed)
Addended by: Lafonda Mosses on: 01/16/2020 03:01 PM   Modules accepted: Level of Service

## 2020-01-23 ENCOUNTER — Telehealth: Payer: Self-pay | Admitting: *Deleted

## 2020-01-23 NOTE — Telephone Encounter (Signed)
Patient called and scheduled an appt to discuss surgery with Dr Berline Lopes. Patient wishes to have surgery the week of June 21st

## 2020-01-23 NOTE — Telephone Encounter (Signed)
Called the patient and gave 6/22 had her surgery date

## 2020-01-26 ENCOUNTER — Encounter: Payer: Self-pay | Admitting: Hematology and Oncology

## 2020-02-02 NOTE — Progress Notes (Signed)
Gynecologic Oncology Return Clinic Visit  02/03/20  Reason for Visit: Discussion regarding therapeutic BSO in the setting of estrogen receptor positive breast cancer  Treatment History: Oncology History  Malignant neoplasm of lower-outer quadrant of right breast of female, estrogen receptor positive ([place])  03/26/2017 Initial Diagnosis   Palpable right breast mass with a normal mammogram: 8:00 position by ultrasound 3.2 cm irregular mass: Grade 2 invasive lobular cancer ER 85%, PR 100%, Ki-67 2%, HER-2 negative ratio 1.5, T2 N0 stage II a clinical stage   04/20/2017 Genetic Testing   Genetic counseling and testing for hereditary cancer syndromes performed on . Results are negative for pathogenic mutations in 46 genes analyzed by [employer]'s Common Hereditary Cancers Panel. Results are dated . Genes tested: APC, ATM, AXIN2, BARD1, BMPR1A, BRCA1, BRCA2, BRIP1, CDH1, CDKN2A, CHEK2, CTNNA1, DICER1, EPCAM, GREM1, HOXB13, KIT, MEN1, MLH1, MSH2, MSH3, MSH6, MUTYH, NBN, NF1, NTHL1, PALB2, PDGFRA, PMS2, POLD1, POLE, PTEN, RAD50, RAD51C, RAD51D, SDHA, SDHB, SDHC, SDHD, SMAD4, SMARCA4, STK11, TP53, TSC1, TSC2, and VHL.  Variants of uncertain significance (not clinically actionable) were noted in APC and BRIP1.      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  She notes some fatigue as well as muscle pain/cramps.  She denies any changes to her medical or surgical history since her last visit with me.  She notes a small area on her right labia minora for years that has been there that does not cause  her any symptoms such as pain or pruritus.  Past Medical/Surgical History: Past Medical History:  Diagnosis Date  . Anemia   . Anxiety   . Bipolar disorder in full remission ([place])   . CAD in native artery cardiologist--  dr hilty   a. 09/2014 Cath/PCI in setting of [place] - s/p 2.25 x 12 mm Promus Premier DES to mid RCA;  b. 11/2016 Myoview: EF 56%, no ischemia;  c. 12/2016 NSTEMI/PCI: LM nl, LAd 25p, LCX 30p, RCA 100p (2.75x38 Promus Premier DES), mRCA 10 ISR, EF 50-55%.  . CKD (chronic kidney disease), stage III   . Depression   . Genetic testing 04/23/2017   Ms. Breuer underwent genetic counseling and testing for hereditary cancer syndromes on 04/05/2017. Her results were negative for mutations in all 46 genes analyzed by [facility]'s 46-gene Common Hereditary Cancers Panel. Genes analyzed include: APC, ATM, AXIN2, BARD1, BMPR1A, BRCA1, BRCA2, BRIP1, CDH1, CDKN2A, CHEK2, CTNNA1, DICER1, EPCAM, GREM1, HOXB13, KIT, MEN1, MLH1, MSH2, MSH3, MSH6, MUTYH, NBN,  . GERD (gastroesophageal reflux disease)   . Headache   . History of external beam radiation therapy    right breast 07-27-2017  to 09-25-2017  . History of non-ST elevation myocardial infarction (NSTEMI)   . HOCM (hypertrophic obstructive cardiomyopathy) ([place]) followed by cardiology   a. 12/2016 Echo: EF 65-70%,  mod conc LVH, dynamic obstruction @ rest, peak velocity of 291 cm/sec w/ peak gradient of 13mHg, no rwma, Gr1 DD, triv TR, PASP 164mg.  . Marland Kitchenyperlipidemia   . Hypertension   . Malignant neoplasm of lower-outer quadrant  of right breast of female, estrogen receptor positive ([facility]) oncologist--- dr gudena   dx 06/ 2018--- right breast invasive lobular carcinoma, ductal carcinoma w/ LCIS---- 06-21-2017 s/p right breast lumpecotmy w/ sln disseciton;   completed radiation 09-25-2017;  on tamoxifen  . S/P drug eluting coronary stent placement    09-14-2014---  PCI with DES x1 to midRCA;  12-31-2016  PCI with DES x1 to proxRCA  . Transverse  myelitis (HCC) neurologist--- dr sater   01/ 2017  dx transverse myelitis w/ right side numbness (09-01-2019  currently lower extremity weakness, mucsle spasms, and gait disturbance)  . Wears glasses     Past Surgical History:  Procedure Laterality Date  . BREAST LUMPECTOMY WITH RADIOACTIVE SEED AND SENTINEL LYMPH NODE BIOPSY Right 06/21/2017   Procedure: RIGHT BREAST BRACKETED SEED GUIDED LUMPECTOMY AND SENTINEL LYMPH NODE BIOPSY;  Surgeon: Toth, Paul III, MD;  Location: [facility] OR;  Service: General;  Laterality: Right;  . CORONARY/GRAFT ACUTE MI REVASCULARIZATION N/A 12/31/2016   Procedure: Coronary/Graft Acute MI Revascularization;  Surgeon: Michael Cooper, MD;  Location: [facility] INV[facility]IVE [facility];  Service: Cardiovascular;  Laterality: N/A;  . DILATATION & CURETTAGE/HYSTEROSCOPY WITH MYOSURE N/A 09/02/2019   Procedure: DILATATION & CURETTAGE/HYSTEROSCOPY WITH MYOSURE;  Surgeon: Fogleman, Kelly, MD;  Location: [facility];  Service: Gynecology;  Laterality: N/A;  . LEFT HEART CATH AND CORONARY ANGIOGRAPHY N/A 12/31/2016   Procedure: Left Heart Cath and Coronary Angiography;  Surgeon: Michael Cooper, MD;  Location: [facility];  Service: Cardiovascular;  Laterality: N/A;  . LEFT HEART CATHETERIZATION WITH CORONARY ANGIOGRAM N/A 09/14/2014   Procedure: LEFT HEART CATHETERIZATION WITH CORONARY ANGIOGRAM;  Surgeon: Christopher D McAlhany, MD;  Location: [facility] CATH [facility];  Service: Cardiovascular;  Laterality: N/A;  . PERCUTANEOUS CORONARY STENT INTERVENTION (PCI-S)  09/14/2014   Procedure: PERCUTANEOUS CORONARY STENT INTERVENTION (PCI-S);  Surgeon: Christopher D McAlhany, MD;  Location: [facility] CATH [facility];  Service: Cardiovascular;;  . TONSILLECTOMY  2005  . UMBILICAL HERNIA REPAIR  2001; 2005    Family History  Problem Relation Age of Onset  . Diabetes Mother   . Heart disease Mother   . Hyperlipidemia Mother   . Hypertension Mother   . Breast cancer Mother   . Lung cancer Father   . Drug  abuse Father   . Brain cancer Father 73  . Bone cancer Father 73  . Drug abuse Sister   . Mental illness Brother   . Mental illness Maternal Grandmother   . Stomach cancer Paternal Grandmother 70       d.75    Social History   Socioeconomic History  . Marital status: Single    Spouse name: Not on file  . Number of children: 0  . Years of education: masters  . Highest education level: Not on file  Occupational History  . 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 Smoking status: Former Smoker    Packs/day: 0.50    Years: 30.00    Pack years: 15.00    Types: Cigarettes    Quit date: 10/05/2015    Years since quitting: 4.3  . Smokeless tobacco: Never Used  Substance and Sexual Activity  . Alcohol use: Yes    Alcohol/week: 2.0 standard drinks    Types: 2 Cans of beer per week  . Drug use: No  . Sexual activity: Not Currently  Other Topics Concern  . Not on file  Social History Narrative     Consumes 2 cups of caffeine daily   Social Determinants of Health   Financial Resource Strain:   . Difficulty of Paying Living Expenses:   Food Insecurity:   . Worried About Running Out of Food in the Last Year:   . Ran Out of Food in the Last Year:   Transportation Needs:   . Lack of Transportation (Medical):   . Lack of Transportation (Non-Medical):   Physical Activity:   . Days of Exercise per Week:   . Minutes of Exercise per Session:   Stress:   . Feeling of Stress :   Social Connections:   . Frequency of Communication with Friends and Family:   . Frequency of Social Gatherings with Friends and Family:   . Attends Religious Services:   . Active Member of Clubs or Organizations:   . Attends Club or Organization Meetings:   . Marital Status:     Current Medications:  Current Outpatient Medications:  .  acetaminophen (TYLENOL) 325 MG tablet, Take 325-650 mg by mouth every 8 (eight) hours as needed (for pain or cramping). ,  Disp: , Rfl:  .  ALPRAZolam (XANAX) 0.5 MG tablet, Take 1-2 tablets 30-60min prior to MRI. Can take additional tab at time of test if needed. Must have driver, can cause drowsiness., Disp: 3 tablet, Rfl: 0 .  amLODipine (NORVASC) 10 MG tablet, TAKE 1 TABLET(10 MG) BY MOUTH DAILY, Disp: 90 tablet, Rfl: 2 .  aspirin 81 MG chewable tablet, Chew 1 tablet (81 mg total) by mouth daily., Disp: , Rfl:  .  atorvastatin (LIPITOR) 80 MG tablet, TAKE 1 TABLET(80 MG) BY MOUTH DAILY, Disp: 90 tablet, Rfl: 1 .  baclofen (LIORESAL) 10 MG tablet, Take up to 4/day for muscle spasms (Patient taking differently: Take 10 mg by mouth 4 (four) times daily. Take up to 4/day for muscle spasms), Disp: 120 each, Rfl: 11 .  carvedilol (COREG) 12.5 MG tablet, Take 1 tablet (12.5 mg total) by mouth 2 (two) times daily with a meal., Disp: 180 tablet, Rfl: 3 .  celecoxib (CELEBREX) 200 MG capsule, Take 1 capsule (200 mg total) by mouth 2 (two) times daily., Disp: 30 capsule, Rfl: 3 .  ferrous sulfate 325 (65 FE) MG tablet, Take 1 tablet (325 mg total) by mouth daily., Disp: 30 tablet, Rfl: 0 .  furosemide (LASIX) 20 MG tablet, Take 1 tablet (20 mg total) by mouth daily as needed., Disp: 90 tablet, Rfl: 0 .  gabapentin (NEURONTIN) 300 MG capsule, Take 600mg (3 capsules) by mouth three times daily, Disp: 270 capsule, Rfl: 3 .  hydrALAZINE (APRESOLINE) 25 MG tablet, TAKE 1 TABLET BY MOUTH THREE TIMES DAILY (Patient taking differently: Take 25 mg by mouth 3 (three) times daily. ), Disp: 270 tablet, Rfl: 1 .  lisinopril (ZESTRIL) 20 MG tablet, TAKE 1 TABLET(20 MG) BY MOUTH TWICE DAILY, Disp: 180 tablet, Rfl: 2 .  nitroGLYCERIN (NITROSTAT) 0.4 MG SL tablet, PLACE 1 TABLET UNDER THE TONGUE EVERY 5 MINUTES AS NEEDED FOR CHEST PAIN, Disp: 25 tablet, Rfl: 4 .  OXcarbazepine (TRILEPTAL) 150 MG tablet, Take 1 tablet in the morning and 2 tablets in the evening by mouth, Disp: 90 tablet, Rfl: 5 .  tamoxifen (NOLVADEX) 20 MG tablet, Take 1 tablet  (20 mg total) by mouth daily., Disp: 90 tablet, Rfl: 3 .  traMADol (ULTRAM) 50 MG tablet, TAKE 1 TABLET(50 MG) BY MOUTH TWICE DAILY AS NEEDED, Disp: 60 tablet, Rfl: 5  Review of Systems: Denies appetite changes, fevers,   chills, unexplained weight changes. Denies hearing loss, neck lumps or masses, mouth sores, ringing in ears or voice changes. Denies cough or wheezing.  Denies shortness of breath. Denies chest pain or palpitations. Denies leg swelling. Denies abdominal distention, pain, blood in stools, constipation, diarrhea, nausea, vomiting, or early satiety. Denies pain with intercourse, dysuria, frequency, hematuria or incontinence. Denies hot flashes, pelvic pain, vaginal bleeding or vaginal discharge.   Denies joint pain, back pain. Denies itching, rash, or wounds. Denies dizziness, headaches, numbness or seizures. Denies swollen lymph nodes or glands, denies easy bruising or bleeding. Denies anxiety, depression, confusion, or decreased concentration.  Physical Exam: BP (!) 131/94 (BP Location: Left Arm, Patient Position: Sitting)   Pulse 95   Temp 98.7 F (37.1 C) (Temporal)   Resp 18   Ht 5' 1.5" (1.562 m)   Wt 149 lb 6 oz (67.8 kg)   SpO2 100%   BMI 27.77 kg/m  General: Alert, oriented, no acute distress. HEENT: Posterior oropharynx clear, sclera anicteric. Chest: Clear to auscultation bilaterally.  No wheezes or rhonchi. Cardiovascular: Regular rate and rhythm, no murmurs. Abdomen: soft, nontender.  Normoactive bowel sounds.  No masses or hepatosplenomegaly appreciated.  Well-healed supraumbilical incision from 2 prior hernia repairs, with mesh. Extremities: Grossly normal range of motion.  Warm, well perfused.  No edema bilaterally. Skin: No rashes or lesions noted. GU: Normal appearing external genitalia without erythema, excoriation.  Small, less than 1 cm cyst on the right labia minora, likely inclusion cyst or lipoma, nontender.  Speculum exam reveals  normal-appearing cervix, well rugated vaginal mucosa.  Bimanual exam reveals anteverted small, mobile uterus, no adnexal masses appreciated.    Laboratory & Radiologic Studies: None new  Assessment & Plan: Anna Henson is a 49 y.o. premenopausal patient with a personal history of estrogen receptor positive breast cancer, currently on tamoxifen,.  Patient is ready to move forward with scheduling therapeutic robotic BSO.  I do not think that there is a strong reason she needs to have concurrent hysterectomy and she would prefer not to.  She is also interested in excision of her right labia minora lesion which I could do at the time of her surgery.  Like to wait to have her surgery in June to use vacation from work.  We discussed plan for robotic BSO, possible laparotomy, excision of right labia minora lesion.  Given her history of hernia repair with mesh, we will plan left upper quadrant entry and adjust port locations around the mesh.  Patient was given perioperative instructions today.  Given surgery not scheduled for 2 months, we will plan to see her back closer to the date of surgery for preoperative visit.  Will get cardiac clearance for surgery.  20 minutes of total time was spent for this patient encounter, including preparation, face-to-face counseling with the patient and coordination of care, and documentation of the encounter.  Jeral Pinch, MD  Division of Gynecologic Oncology  Department of Obstetrics and Gynecology  [facility]

## 2020-02-03 ENCOUNTER — Inpatient Hospital Stay (HOSPITAL_BASED_OUTPATIENT_CLINIC_OR_DEPARTMENT_OTHER): Payer: 59 | Admitting: Gynecologic Oncology

## 2020-02-03 ENCOUNTER — Other Ambulatory Visit: Payer: Self-pay

## 2020-02-03 VITALS — BP 131/94 | HR 95 | Temp 98.7°F | Resp 18 | Ht 61.5 in | Wt 149.4 lb

## 2020-02-03 DIAGNOSIS — C50511 Malignant neoplasm of lower-outer quadrant of right female breast: Secondary | ICD-10-CM

## 2020-02-03 DIAGNOSIS — Z170 Estrogen receptor positive status [ER+]: Secondary | ICD-10-CM

## 2020-02-03 DIAGNOSIS — Z79810 Long term (current) use of selective estrogen receptor modulators (SERMs): Secondary | ICD-10-CM | POA: Diagnosis not present

## 2020-02-03 DIAGNOSIS — Z87891 Personal history of nicotine dependence: Secondary | ICD-10-CM | POA: Diagnosis not present

## 2020-02-03 DIAGNOSIS — Z923 Personal history of irradiation: Secondary | ICD-10-CM | POA: Diagnosis not present

## 2020-02-03 DIAGNOSIS — Z1502 Genetic susceptibility to malignant neoplasm of ovary: Secondary | ICD-10-CM

## 2020-02-03 NOTE — Patient Instructions (Addendum)
Preparing for your Surgery  Plan for surgery on March 30, 2020 with Dr. Jeral Pinch at [facility] will be scheduled for a robotic assisted laparoscopic bilateral salpingo-oophorectomy, right vulvar mass excision.   Pre-operative Testing -You will receive a phone call from presurgical testing at [facility] to arrange for a pre-operative appointment over the phone, lab appointment, and COVID test. The COVID test normally happens 3 days prior to the surgery and they ask that you self quarantine after the test up until surgery to decrease chance of exposure.  -Bring your insurance card, copy of an advanced directive if applicable, medication list  -At that visit, you will be asked to sign a consent for a possible blood transfusion in case a transfusion becomes necessary during surgery.  The need for a blood transfusion is rare but having consent is a necessary part of your care.     -You should not be taking blood thinners or aspirin at least ten days prior to surgery unless instructed by your surgeon.  -Do not take supplements such as fish oil (omega 3), red yeast rice, turmeric before your surgery.   -You can continue taking your baby aspirin.  Plan to stop taking your celebrex five days before surgery.  Day Before Surgery at [facility] will be asked to take in a light diet the day before surgery.  Avoid carbonated beverages.  You will be advised you can have clear liquids after midnight and up until 3 hours before your surgery.    Eat a light diet the day before surgery.  Examples including soups, broths, toast, yogurt, mashed potatoes.  Things to avoid include carbonated beverages (fizzy beverages), raw fruits and raw vegetables, or beans.   If your bowels are filled with gas, your surgeon will have difficulty visualizing your pelvic organs which increases your surgical risks.  Your role in recovery Your role is to become active as soon as directed by your doctor,  while still giving yourself time to heal.  Rest when you feel tired. You will be asked to do the following in order to speed your recovery:  - Cough and breathe deeply. This helps to clear and expand your lungs and can prevent pneumonia after surgery.  - Anna Henson. Do mild physical activity. Walking or moving your legs help your circulation and body functions return to normal. Do not try to get up or walk alone the first time after surgery.   -If you develop swelling on one leg or the other, pain in the back of your leg, redness/warmth in one of your legs, please call the office or go to the Emergency Room to have a doppler to rule out a blood clot. For shortness of breath, chest pain-seek care in the Emergency Room as soon as possible. - Actively manage your pain. Managing your pain lets you move in comfort. We will ask you to rate your pain on a scale of zero to 10. It is your responsibility to tell your doctor or nurse where and how much you hurt so your pain can be treated.  Special Considerations -If you are diabetic, you may be placed on insulin after surgery to have closer control over your blood sugars to promote healing and recovery.  This does not mean that you will be discharged on insulin.  If applicable, your oral antidiabetics will be resumed when you are tolerating a solid diet.  -Your final pathology results from surgery should be  available around one week after surgery and the results will be relayed to you when available.  -Dr. Lahoma Crocker is the surgeon that assists your GYN Oncologist with surgery.  If you end up staying the night, the next day after your surgery you will either see Dr. Denman George, Dr. Berline Lopes, or Dr. Lahoma Crocker.  -FMLA forms can be faxed to [phone] and please allow 5-7 business days for completion.  Pain Management After Surgery -You will be prescribed your pain medication and bowel regimen medications before surgery so that you  can have these available when you are discharged from the hospital. The pain medication is for use ONLY AFTER surgery and a new prescription will not be given.   -Make sure that you have Tylenol and Ibuprofen at home to use on a regular basis after surgery for pain control. We recommend alternating the medications every hour to six hours since they work differently and are processed in the body differently for pain relief.  -Review the attached handout on narcotic use and their risks and side effects.   Bowel Regimen -You will be prescribed Sennakot-S to take nightly to prevent constipation especially if you are taking the narcotic pain medication intermittently.  It is important to prevent constipation and drink adequate amounts of liquids. You can stop taking this medication when you are not taking pain medication and you are back on your normal bowel routine.  Risks of Surgery Risks of surgery are low but include bleeding, infection, damage to surrounding structures, re-operation, blood clots, and very rarely death.   Blood Transfusion Information (For the consent to be signed before surgery)  We will be checking your blood type before surgery so in case of emergencies, we will know what type of blood you would need.                                            WHAT IS A BLOOD TRANSFUSION?  A transfusion is the replacement of blood or some of its parts. Blood is made up of multiple cells which provide different functions.  Red blood cells carry oxygen and are used for blood loss replacement.  White blood cells fight against infection.  Platelets control bleeding.  Plasma helps clot blood.  Other blood products are available for specialized needs, such as hemophilia or other clotting disorders. BEFORE THE TRANSFUSION  Who gives blood for transfusions?   You may be able to donate blood to be used at a later date on yourself (autologous donation).  Relatives can be asked to donate  blood. This is generally not any safer than if you have received blood from a stranger. The same precautions are taken to ensure safety when a relative's blood is donated.  Healthy volunteers who are fully evaluated to make sure their blood is safe. This is blood bank blood. Transfusion therapy is the safest it has ever been in the practice of medicine. Before blood is taken from a donor, a complete history is taken to make sure that person has no history of diseases nor engages in risky social behavior (examples are intravenous drug use or sexual activity with multiple partners). The donor's travel history is screened to minimize risk of transmitting infections, such as malaria. The donated blood is tested for signs of infectious diseases, such as HIV and hepatitis. The blood is then tested to be sure it  is compatible with you in order to minimize the chance of a transfusion reaction. If you or a relative donates blood, this is often done in anticipation of surgery and is not appropriate for emergency situations. It takes many days to process the donated blood. RISKS AND COMPLICATIONS Although transfusion therapy is very safe and saves many lives, the main dangers of transfusion include:   Getting an infectious disease.  Developing a transfusion reaction. This is an allergic reaction to something in the blood you were given. Every precaution is taken to prevent this. The decision to have a blood transfusion has been considered carefully by your caregiver before blood is given. Blood is not given unless the benefits outweigh the risks.  AFTER SURGERY INSTRUCTIONS  Return to work: 4-6 weeks if applicable  Activity: 1. Be up and out of the bed during the day.  Take a nap if needed.  You may walk up steps but be careful and use the hand rail.  Stair climbing will tire you more than you think, you may need to stop part way and rest.   2. No lifting or straining for 6 weeks over 10 pounds. No pushing,  pulling, straining for 6 weeks.  3. No driving for 1 week(s).  Do not drive if you are taking narcotic pain medicine and make sure that your reaction time has returned.   4. You can shower as soon as the next day after surgery. Shower daily.  Use soap and water on your incision and pat dry; don't rub.  No tub baths or submerging your body in water until cleared by your surgeon. If you have the soap that was given to you by pre-surgical testing that was used before surgery, you do not need to use it afterwards because this can irritate your incisions.   5. No sexual activity and nothing in the vagina for 2 weeks.  6. You may experience a small amount of clear drainage from your incisions, which is normal.  If the drainage persists, increases, or changes color please call the office.  7. Do not use creams, lotions, or ointments such as neosporin on your incisions after surgery until advised by your surgeon because they can cause removal of the dermabond glue on your incisions.    8. Take Tylenol or ibuprofen first for pain and only use narcotic pain medication for severe pain not relieved by the Tylenol or Ibuprofen.  Monitor your Tylenol intake to a max of 4,000 mg.  Diet: 1. Low sodium Heart Healthy Diet is recommended.  2. It is safe to use a laxative, such as Miralax or Colace, if you have difficulty moving your bowels. You can take Sennakot at bedtime every evening to keep bowel movements regular and to prevent constipation.    Wound Care: 1. Keep clean and dry.  Shower daily.  Reasons to call the Doctor:  Fever - Oral temperature greater than 100.4 degrees Fahrenheit  Foul-smelling vaginal discharge  Difficulty urinating  Nausea and vomiting  Increased pain at the site of the incision that is unrelieved with pain medicine.  Difficulty breathing with or without chest pain  New calf pain especially if only on one side  Sudden, continuing increased vaginal bleeding with or  without clots.   Contacts: For questions or concerns you should contact:  Dr. Jeral Pinch at [phone]  Joylene John, NP at [phone]  After Hours: call [phone] and have the GYN Oncologist paged/contacted

## 2020-02-04 ENCOUNTER — Telehealth: Payer: Self-pay

## 2020-02-04 NOTE — Telephone Encounter (Signed)
Told Anna Henson that her expected recovery time is ~3 weeks. FMLA would begin the day of her COVID test and go for ~ 3 weeks post op.  Anna Brynda Greathouse does have ability to work from home if needed. She has the office fax number to have FMLA papers sent to the office.

## 2020-02-05 ENCOUNTER — Other Ambulatory Visit: Payer: Self-pay | Admitting: Gynecologic Oncology

## 2020-02-05 DIAGNOSIS — Z170 Estrogen receptor positive status [ER+]: Secondary | ICD-10-CM

## 2020-02-05 DIAGNOSIS — C50511 Malignant neoplasm of lower-outer quadrant of right female breast: Secondary | ICD-10-CM

## 2020-02-06 ENCOUNTER — Telehealth: Payer: Self-pay

## 2020-02-06 NOTE — Telephone Encounter (Signed)
   Primary Cardiologist: Pixie Casino, MD  Left a voicemail for patient to call back for ongoing preoperative assessment.   She was last seen by Almyra Deforest, PA-C 09/01/2019 for preop assessment for the same procedure. Cleared to hold aspirin 5-7 days prior to if needed. Would be reasonable to hold again if no interval change in cardiac history/symptoms.   Abigail Butts, PA-C 02/06/2020, 2:30 PM

## 2020-02-06 NOTE — Telephone Encounter (Signed)
   Primary Cardiologist: Pixie Casino, MD  Chart reviewed as part of pre-operative protocol coverage. Patient was contacted 02/06/2020 in reference to pre-operative risk assessment for pending surgery as outlined below.  Anna Henson was last seen on 08/31/2020 by Almyra Deforest, PA-C.  Since that day, Anna Henson has done fine from a cardiac standpoint. She is somewhat limited in mobility by leg pain 2/2 transverse myelitis, though can still complete 4 METs without anginal complaints.  Therefore, based on ACC/AHA guidelines, the patient would be at acceptable risk for the planned procedure without further cardiovascular testing.   I will route this recommendation to the requesting party via Epic fax function and remove from pre-op pool.  Please call with questions.  Anna Butts, PA-C 02/06/2020, 2:45 PM   Of note, patient mentions that she is has not been taking carvedilol since she was recommended to restart this medication 08/2019 due to GI upset. She has been taking hydralazine, amlodipine, lisinopril, and prn lasix with SBP typically in the 130s. Suggested a telemedicine visit to further discuss HTN management. Given CAD history, she'd benefit from being on a BBlocker. Could consider metoprolol. I have asked her to track her BP daily to assist with decision making. Will route to [facility] scheduling to help arrange a telemedicine visit with Dr. Rennie Plowman in the next month.

## 2020-02-06 NOTE — Telephone Encounter (Signed)
   [facility] Pre-operative Risk Assessment    Request for surgical clearance:  1. What type of surgery is being performed? RALBSO, Right Vulvar Mass Excision   2. When is this surgery scheduled? 03/30/2020  3. What type of clearance is required (medical clearance vs. Pharmacy clearance to hold med vs. Both)? both  4. Are there any medications that need to be held prior to surgery and how long? Aspirin  5. Practice name and name of physician performing surgery? [facility]   6. What is your office phone number [phone]   7.   What is your office fax number [phone]  8.   Anesthesia type (None, local, MAC, general) ? general   Sherrie Mustache 02/06/2020, 12:47 PM  _________________________________________________________________   (provider comments below)

## 2020-02-09 ENCOUNTER — Encounter: Payer: Self-pay | Admitting: Physician Assistant

## 2020-02-09 ENCOUNTER — Telehealth: Payer: Self-pay

## 2020-02-09 ENCOUNTER — Telehealth (INDEPENDENT_AMBULATORY_CARE_PROVIDER_SITE_OTHER): Payer: 59 | Admitting: Physician Assistant

## 2020-02-09 VITALS — BP 140/88 | HR 109 | Ht 61.5 in | Wt 149.6 lb

## 2020-02-09 DIAGNOSIS — Z01810 Encounter for preprocedural cardiovascular examination: Secondary | ICD-10-CM | POA: Diagnosis not present

## 2020-02-09 DIAGNOSIS — N183 Chronic kidney disease, stage 3 unspecified: Secondary | ICD-10-CM

## 2020-02-09 DIAGNOSIS — I2510 Atherosclerotic heart disease of native coronary artery without angina pectoris: Secondary | ICD-10-CM

## 2020-02-09 DIAGNOSIS — E785 Hyperlipidemia, unspecified: Secondary | ICD-10-CM | POA: Diagnosis not present

## 2020-02-09 DIAGNOSIS — I10 Essential (primary) hypertension: Secondary | ICD-10-CM

## 2020-02-09 DIAGNOSIS — G373 Acute transverse myelitis in demyelinating disease of central nervous system: Secondary | ICD-10-CM

## 2020-02-09 MED ORDER — METOPROLOL TARTRATE 25 MG PO TABS
25.0000 mg | ORAL_TABLET | Freq: Two times a day (BID) | ORAL | 3 refills | Status: DC
Start: 2020-02-09 — End: 2020-10-15

## 2020-02-09 NOTE — Telephone Encounter (Signed)
Called patient to discuss AVS instructions gave Hao Meng's recommendations and patient voiced understanding. AVS summary mailed to patient.    

## 2020-02-09 NOTE — Progress Notes (Signed)
Virtual Visit via Video Note   This visit type was conducted due to national recommendations for restrictions regarding the COVID-19 Pandemic (e.g. social distancing) in an effort to limit this patient's exposure and mitigate transmission in our community.  Due to her co-morbid illnesses, this patient is at least at moderate risk for complications without adequate follow up.  This format is felt to be most appropriate for this patient at this time.  All issues noted in this document were discussed and addressed.  A limited physical exam was performed with this format.  Please refer to the patient's chart for her consent to telehealth for [facility].   The patient was identified using 2 identifiers.  Date:  02/11/2020   ID:  [name], DOB 02/25/1971, MRN [id number]  Patient Location: Home Provider Location: Home  PCP:  Patient, No Pcp Per  Cardiologist:  Pixie Casino, MD  Electrophysiologist:  None   Evaluation Performed:  Follow-Up Visit  Chief Complaint:  Preoperative clearance  History of Present Illness:    Mallika Sanmiguel is a 49 y.o. female with HTN, HLD, CKD stage III, CAD, breast cancer on tamoxifen, transverse myelitis in 2017 and remote tobacco use.  Patient underwent DES to the mid RCA in 2015.  She had normal Myoview on 11/14/2016, however ended up having NSTEMI a month later.  She eventually underwent cardiac catheterization on 12/31/2016 and had DES to proximal RCA in March 2018 with mild LAD and the left circumflex residual.  EF was 65% by echocardiogram.  Last echocardiogram obtained on 07/29/2018 showed EF 65 to 70%, grade 1 DD, moderate LVH, mild LAE, otherwise no significant valve issue.  She was having chest pain in the setting of uncontrolled hypertension.  She did not wish to do a Lexiscan again.  Patient was placed on appropriate blood pressure therapy and was discharged.    I last saw the patient in November 2020 for preoperative clearance.  Due to her leg  weakness from transverse myelitis, she cannot walk very far before having to stop and rest.  She previously had a falsely negative stress test in 2018, therefore I did not try to repeat another stress test.  Since she was largely asymptomatic from cardiac perspective, I recommend she proceed with surgery.  After the surgery, I recommended she discontinue her hydralazine and switch to carvedilol 6.25 mg twice daily.  She has upcoming robotic BSO, possible laparotomy and excision of right labia minora lesion by Dr. Jeral Pinch in June.  Patient was contacted today for follow-up.  She denies any recent chest pain or shortness of breath.  During the last office visit, I instructed her to stop her hydralazine and the switch to carvedilol after the surgery because I did not wish to switch her to a beta-blocker immediately prior to the procedure.  Unfortunately she could not tolerate the carvedilol as it gives her GI upset.  She is looking for alternative management.  She has not taking carvedilol since earlier this year and is currently running out of hydralazine as well.  I recommended to use metoprolol tartrate 25 mg twice daily to replace her hydralazine.  Otherwise her overall condition has not changed and she denies any recent chest pain or worsening shortness of breath.  She is cleared to proceed with upcoming procedure from cardiac perspective.  Ideally, we would prefer her to continue through the surgery on aspirin, however if absolutely needs to, she may hold aspirin for 5 to 7 days prior to  the surgery and restart as soon as possible afterward.  The patient does not have symptoms concerning for COVID-19 infection (fever, chills, cough, or new shortness of breath).    Past Medical History:  Diagnosis Date  . Anemia   . Anxiety   . Bipolar disorder in full remission ([place])   . CAD in native artery cardiologist--  dr hilty   a. 09/2014 Cath/PCI in setting of [place] - s/p 2.25 x 12 mm Promus Premier  DES to mid RCA;  b. 11/2016 Myoview: EF 56%, no ischemia;  c. 12/2016 NSTEMI/PCI: LM nl, LAd 25p, LCX 30p, RCA 100p (2.75x38 Promus Premier DES), mRCA 10 ISR, EF 50-55%.  . CKD (chronic kidney disease), stage III   . Depression   . Genetic testing 04/23/2017   Ms. Dickison underwent genetic counseling and testing for hereditary cancer syndromes on 04/05/2017. Her results were negative for mutations in all 46 genes analyzed by [facility]'s 46-gene Common Hereditary Cancers Panel. Genes analyzed include: APC, ATM, AXIN2, BARD1, BMPR1A, BRCA1, BRCA2, BRIP1, CDH1, CDKN2A, CHEK2, CTNNA1, DICER1, EPCAM, GREM1, HOXB13, KIT, MEN1, MLH1, MSH2, MSH3, MSH6, MUTYH, NBN,  . GERD (gastroesophageal reflux disease)   . Headache   . History of external beam radiation therapy    right breast 07-27-2017  to 09-25-2017  . History of non-ST elevation myocardial infarction (NSTEMI)   . HOCM (hypertrophic obstructive cardiomyopathy) ([place]) followed by cardiology   a. 12/2016 Echo: EF 65-70%,  mod conc LVH, dynamic obstruction @ rest, peak velocity of 291 cm/sec w/ peak gradient of 56mHg, no rwma, Gr1 DD, triv TR, PASP 151mg.  . Marland Kitchenyperlipidemia   . Hypertension   . Malignant neoplasm of lower-outer quadrant of right breast of female, estrogen receptor positive [facility] dr guLindi Adie dx 06/ 2018--- right breast invasive lobular carcinoma, ductal carcinoma w/ LCIS---- 06-21-2017 s/p right breast lumpecotmy w/ sln disseciton;   completed radiation 09-25-2017;  on tamoxifen  . S/P drug eluting coronary stent placement    09-14-2014---  [facility]  PCI with DES x1 to proxRCA  . [facility] dr saFelecia Shelling 01/ 2017  dx transverse myelitis w/ right side numbness (09-01-2019  currently lower extremity weakness, mucsle spasms, and gait disturbance)  . Wears glasses    Past Surgical History:  Procedure Laterality Date  . BREAST LUMPECTOMY WITH RADIOACTIVE SEED AND SENTINEL  LYMPH NODE BIOPSY Right 06/21/2017   Procedure: RIGHT BREAST BRACKETED SEED GUIDED LUMPECTOMY AND SENTINEL LYMPH NODE BIOPSY;  Surgeon: ToJovita KussmaulMD;  [facility] Service: General;  Laterality: Right;  . CORONARY/GRAFT ACUTE MI REVASCULARIZATION N/A 12/31/2016   Procedure: Coronary/Graft Acute MI Revascularization;  Surgeon: MiSherren MochaMD;  [facility];  Service: Cardiovascular;  Laterality: N/A;  . DILATATION & CURETTAGE/HYSTEROSCOPY WITH MYOSURE N/A 09/02/2019   Procedure: DILATATION & CURETTAGE/HYSTEROSCOPY WITH MYOSURE;  Surgeon: FoAloha GellMD;  [facility]: [facility];  Laterality: N/A;  . LEFT HEART CATH AND CORONARY ANGIOGRAPHY N/A 12/31/2016   Procedure: Left Heart Cath and Coronary Angiography;  Surgeon: MiSherren MochaMD;  [facility];  Service: Cardiovascular;  Laterality: N/A;  . LEFT HEART CATHETERIZATION WITH CORONARY ANGIOGRAM N/A 09/14/2014   Procedure: LEFT HEART CATHETERIZATION WITH CORONARY ANGIOGRAM;  Surgeon: ChBurnell BlanksMD;  [facility];  Service: Cardiovascular;  Laterality: N/A;  . PERCUTANEOUS CORONARY STENT INTERVENTION (PCI-S)  09/14/2014   Procedure: PERCUTANEOUS  CORONARY STENT INTERVENTION (PCI-S);  Surgeon: Burnell Blanks, MD;  Location: [facility] C[facility] LAB;  Service: Cardiovascular;;  . TONSILLECTOMY  2005  . UMBILICAL HERNIA REPAIR  2001; 2005     Current Meds  Medication Sig  . acetaminophen (TYLENOL) 325 MG tablet Take 325-650 mg by mouth every 8 (eight) hours as needed (for pain or cramping).   Marland Kitchen amLODipine (NORVASC) 10 MG tablet TAKE 1 TABLET(10 MG) BY MOUTH DAILY  . aspirin 81 MG chewable tablet Chew 1 tablet (81 mg total) by mouth daily.  Marland Kitchen atorvastatin (LIPITOR) 80 MG tablet TAKE 1 TABLET(80 MG) BY MOUTH DAILY  . baclofen (LIORESAL) 10 MG tablet Take up to 4/day for muscle spasms (Patient taking differently: Take 10 mg by mouth 4 (four) times  daily. Take up to 4/day for muscle spasms)  . celecoxib (CELEBREX) 200 MG capsule Take 1 capsule (200 mg total) by mouth 2 (two) times daily.  . ferrous sulfate 325 (65 FE) MG tablet Take 1 tablet (325 mg total) by mouth daily.  . furosemide (LASIX) 20 MG tablet Take 1 tablet (20 mg total) by mouth daily as needed.  . gabapentin (NEURONTIN) 300 MG capsule Take '600mg'$  (3 capsules) by mouth three times daily  . lisinopril (ZESTRIL) 20 MG tablet TAKE 1 TABLET(20 MG) BY MOUTH TWICE DAILY  . tamoxifen (NOLVADEX) 20 MG tablet Take 1 tablet (20 mg total) by mouth daily.  . traMADol (ULTRAM) 50 MG tablet TAKE 1 TABLET(50 MG) BY MOUTH TWICE DAILY AS NEEDED  . [DISCONTINUED] hydrALAZINE (APRESOLINE) 25 MG tablet TAKE 1 TABLET BY MOUTH THREE TIMES DAILY (Patient taking differently: Take 25 mg by mouth 3 (three) times daily. )     Allergies:   Pork-derived products   Social History   Tobacco Use  . Smoking status: Former Smoker    Packs/day: 0.50    Years: 30.00    Pack years: 15.00    Types: Cigarettes    Quit date: 10/05/2015    Years since quitting: 4.3  . Smokeless tobacco: Never Used  Substance Use Topics  . Alcohol use: Yes    Alcohol/week: 2.0 standard drinks    Types: 2 Cans of beer per week  . Drug use: No     Family Hx: The patient's family history includes Bone cancer (age of onset: 69) in her father; Brain cancer (age of onset: 34) in her father; Breast cancer in her mother; Diabetes in her mother; Drug abuse in her father and sister; Heart disease in her mother; Hyperlipidemia in her mother; Hypertension in her mother; Lung cancer in her father; Mental illness in her brother and maternal grandmother; Stomach cancer (age of onset: 68) in her paternal grandmother.  ROS:   Please see the history of present illness.     All other systems reviewed and are negative.   Prior CV studies:   The following studies were reviewed today:  Echo 07/29/2018 LV EF: 65% -  70%    -------------------------------------------------------------------  Indications:   Chest pain 786.51.   -------------------------------------------------------------------  History:  PMH: Chronic Kidney Disease. Coronary artery disease.  PMH:  Myocardial infarction. Risk factors: Current tobacco use.  Hypertension. Dyslipidemia.   -------------------------------------------------------------------  Study Conclusions   - Left ventricle: The cavity size was normal. Wall thickness was  increased in a pattern of moderate LVH. Systolic function was  vigorous. The estimated ejection fraction was in the range of 65%  to 70%. Grossly normal longitudinal LV strain at -18%. Wall  motion  was normal; there were no regional wall motion  abnormalities. Doppler parameters are consistent with abnormal  left ventricular relaxation (grade 1 diastolic dysfunction). The  E/e&' ratio is between 8-15, suggesting indeterminate LV filling  pressure.  - Aortic valve: Sclerosis without stenosis. There was no  regurgitation.  - Mitral valve: Mildly thickened leaflets . There was trivial  regurgitation.  - Left atrium: The atrium was mildly dilated.  - Inferior vena cava: The vessel was normal in size. The  respirophasic diameter changes were in the normal range (>= 50%),  consistent with normal central venous pressure.   Impressions:   - Compared to a prior study in 09/2017, there is now moderate LVH  with an increased LVEF of 65-70%, grade 1 DD and indeterminate LV  filling pressure.   Labs/Other Tests and Data Reviewed:    EKG:  An ECG dated 11/14/2019 was personally reviewed today and demonstrated:  Normal sinus rhythm without significant ST-T wave changes  Recent Labs: 11/14/2019: BUN 10; Creatinine, Ser 1.17; Hemoglobin 7.3; Platelets 422; Potassium 3.1; Sodium 142   Recent Lipid Panel Lab Results  Component Value Date/Time   CHOL 149 07/09/2018 03:09 PM    TRIG 99 07/09/2018 03:09 PM   HDL 42 07/09/2018 03:09 PM   CHOLHDL 3.5 07/09/2018 03:09 PM   CHOLHDL 3.9 12/31/2016 10:36 AM   LDLCALC 87 07/09/2018 03:09 PM    Wt Readings from Last 3 Encounters:  02/09/20 149 lb 9.6 oz (67.9 kg)  02/03/20 149 lb 6 oz (67.8 kg)  12/15/19 153 lb 8 oz (69.6 kg)     Objective:    Vital Signs:  BP 140/88   Pulse (!) 109   Ht 5' 1.5" (1.562 m)   Wt 149 lb 9.6 oz (67.9 kg)   LMP 01/19/2020   BMI 27.81 kg/m    VITAL SIGNS:  reviewed  ASSESSMENT & PLAN:    1. Preoperative clearance: Upcoming robotic BSO, possible laparotomy, and excision of the right labia minora lesion by Dr. Jeral Pinch.  She has no recent chest discomfort.  Functional ability limited by chronic lower extremity weakness secondary to transverse myelitis.  Her last PCI was in March 2018.  Her last echocardiogram in 2019 was normal.  At this time, given lack of chest discomfort, she does not require any further work-up.  If needed, she may hold aspirin for 5 to 7 days prior to the procedure and restart as soon as possible afterward.  We will further decrease her overall risk by adding low-dose metoprolol tartrate to her medical regimen  2. Transverse myelitis: Chronic lower extremity weakness  3. CAD: Last PCI was in 2018, currently on aspirin and Lipitor.  May hold aspirin if needed for the upcoming procedure.  4. Hypertension: Add low-dose metoprolol tartrate to her medical regimen.  Unfortunately she could not tolerate carvedilol due to GI upset side effect.  5. Hyperlipidemia: Continue Lipitor 80 mg daily.  6. CKD stage III: Stable renal function  COVID-19 Education: The signs and symptoms of COVID-19 were discussed with the patient and how to seek care for testing (follow up with PCP or arrange E-visit).  The importance of social distancing was discussed today.  Time:   Today, I have spent 10 minutes with the patient with telehealth technology discussing the above  problems.     Medication Adjustments/Labs and Tests Ordered: Current medicines are reviewed at length with the patient today.  Concerns regarding medicines are outlined above.   Tests Ordered: No orders  of the defined types were placed in this encounter.   Medication Changes: Meds ordered this encounter  Medications  . metoprolol tartrate (LOPRESSOR) 25 MG tablet    Sig: Take 1 tablet (25 mg total) by mouth 2 (two) times daily.    Dispense:  180 tablet    Refill:  3    Follow Up:  Either In Person or Virtual in 6 month(s)  Signed, Almyra Deforest, [place]  02/11/2020 11:59 PM    [place]

## 2020-02-09 NOTE — Telephone Encounter (Signed)
  Patient Consent for Virtual Visit         Anna Henson has provided verbal consent on 02/09/2020 for a virtual visit (video or telephone).   CONSENT FOR VIRTUAL VISIT FOR:  Anna Henson  By participating in this virtual visit I agree to the following:  I hereby voluntarily request, consent and authorize [facility] and its employed or contracted physicians, physician assistants, nurse practitioners or other licensed health care professionals (the Practitioner), to provide me with telemedicine health care services (the "Services") as deemed necessary by the treating Practitioner. I acknowledge and consent to receive the Services by the Practitioner via telemedicine. I understand that the telemedicine visit will involve communicating with the Practitioner through live audiovisual communication technology and the disclosure of certain medical information by electronic transmission. I acknowledge that I have been given the opportunity to request an in-person assessment or other available alternative prior to the telemedicine visit and am voluntarily participating in the telemedicine visit.  I understand that I have the right to withhold or withdraw my consent to the use of telemedicine in the course of my care at any time, without affecting my right to future care or treatment, and that the Practitioner or I may terminate the telemedicine visit at any time. I understand that I have the right to inspect all information obtained and/or recorded in the course of the telemedicine visit and may receive copies of available information for a reasonable fee.  I understand that some of the potential risks of receiving the Services via telemedicine include:  Marland Kitchen Delay or interruption in medical evaluation due to technological equipment failure or disruption; . Information transmitted may not be sufficient (e.g. poor resolution of images) to allow for appropriate medical decision making by the Practitioner;  and/or  . In rare instances, security protocols could fail, causing a breach of personal health information.  Furthermore, I acknowledge that it is my responsibility to provide information about my medical history, conditions and care that is complete and accurate to the best of my ability. I acknowledge that Practitioner's advice, recommendations, and/or decision may be based on factors not within their control, such as incomplete or inaccurate data provided by me or distortions of diagnostic images or specimens that may result from electronic transmissions. I understand that the practice of medicine is not an exact science and that Practitioner makes no warranties or guarantees regarding treatment outcomes. I acknowledge that a copy of this consent can be made available to me via my patient portal ([facility]), or I can request a printed copy by calling the office of [facility].    I understand that my insurance will be billed for this visit.   I have read or had this consent read to me. . I understand the contents of this consent, which adequately explains the benefits and risks of the Services being provided via telemedicine.  . I have been provided ample opportunity to ask questions regarding this consent and the Services and have had my questions answered to my satisfaction. . I give my informed consent for the services to be provided through the use of telemedicine in my medical care

## 2020-02-09 NOTE — Patient Instructions (Signed)
Medication Instructions:   STOP Carvedilol  STOP Hydralazine  START Metoprolol Tartrate 25 mg 2 times a day  *If you need a refill on your cardiac medications before your next appointment, please call your pharmacy*  Lab Work: NONE ordered at this time of appointment   If you have labs (blood work) drawn today and your tests are completely normal, you will receive your results only by: Marland Kitchen MyChart Message (if you have MyChart) OR . A paper copy in the mail If you have any lab test that is abnormal or we need to change your treatment, we will call you to review the results.  Testing/Procedures: NONE ordered at this time of appointment   Follow-Up: At [facility], you and your health needs are our priority.  As part of our continuing mission to provide you with exceptional heart care, we have created designated Provider Care Teams.  These Care Teams include your primary Cardiologist (physician) and Advanced Practice Providers (APPs -  Physician Assistants and Nurse Practitioners) who all work together to provide you with the care you need, when you need it.  Your next appointment:   6 month(s)  The format for your next appointment:   In Person  Provider:   Raliegh Ip Mali Hilty, MD  Other Instructions   You have been cleared for your upcoming surgery

## 2020-02-11 ENCOUNTER — Encounter: Payer: Self-pay | Admitting: Physician Assistant

## 2020-03-11 ENCOUNTER — Other Ambulatory Visit: Payer: Self-pay | Admitting: Neurology

## 2020-03-12 NOTE — Patient Instructions (Addendum)
DUE TO COVID-19 ONLY ONE VISITOR IS ALLOWED TO COME WITH YOU AND STAY IN THE WAITING ROOM ONLY DURING PRE OP AND PROCEDURE DAY OF SURGERY. THE 1 VISITOR MAY VISIT WITH YOU AFTER SURGERY IN YOUR PRIVATE ROOM DURING VISITING HOURS ONLY!  YOU NEED TO HAVE A COVID 19 TEST ON: 03/26/20 @ 8:15 am , THIS TEST MUST BE DONE BEFORE SURGERY, COME  [address], [place] , [address].  ([place]) ONCE YOUR COVID TEST IS COMPLETED, PLEASE BEGIN THE QUARANTINE INSTRUCTIONS AS OUTLINED IN YOUR HANDOUT.                Anna Henson    Your procedure is scheduled on: 03/30/20   Report to [facility] Main  Entrance   Report to short stay at: 5:30 AM     Call this number if you have problems the morning of surgery [phone]    Remember: Do not eat food or drink liquids :After Midnight.   BRUSH YOUR TEETH MORNING OF SURGERY AND RINSE YOUR MOUTH OUT, NO CHEWING GUM CANDY OR MINTS.    Take these medicines the morning of surgery with A SIP OF WATER: amlodipine,gabapentin,metoprolol,tamoxifen. Use flonase as usual.               You may not have any metal on your body including hair pins and              piercings  Do not wear jewelry, make-up, lotions, powders or perfumes, deodorant             Do not wear nail polish on your fingernails.  Do not shave  48 hours prior to surgery.            Do not bring valuables to the hospital. [place].  Contacts, dentures or bridgework may not be worn into surgery.  Leave suitcase in the car. After surgery it may be brought to your room.     Patients discharged the day of surgery will not be allowed to drive home. IF YOU ARE HAVING SURGERY AND GOING HOME THE SAME DAY, YOU MUST HAVE AN ADULT TO DRIVE YOU HOME AND BE WITH YOU FOR 24 HOURS. YOU MAY GO HOME BY TAXI OR UBER OR ORTHERWISE, BUT AN ADULT MUST ACCOMPANY YOU HOME AND STAY WITH YOU FOR 24 HOURS.  Name and phone number of your  driver:  Special Instructions: N/A              Please read over the following fact sheets you were given: _____________________________________________________________________  [facility] - Preparing for Surgery Before surgery, you can play an important role.  Because skin is not sterile, your skin needs to be as free of germs as possible.  You can reduce the number of germs on your skin by washing with CHG (chlorahexidine gluconate) soap before surgery.  CHG is an antiseptic cleaner which kills germs and bonds with the skin to continue killing germs even after washing. Please DO NOT use if you have an allergy to CHG or antibacterial soaps.  If your skin becomes reddened/irritated stop using the CHG and inform your nurse when you arrive at Short Stay. Do not shave (including legs and underarms) for at least 48 hours prior to the first CHG shower.  You may shave your face/neck. Please follow these instructions carefully:  1.  Shower with CHG Soap the night before surgery and the  morning of Surgery.  2.  If you choose to wash your hair, wash your hair first as usual with your  normal  shampoo.  3.  After you shampoo, rinse your hair and body thoroughly to remove the  shampoo.                           4.  Use CHG as you would any other liquid soap.  You can apply chg directly  to the skin and wash                       Gently with a scrungie or clean washcloth.  5.  Apply the CHG Soap to your body ONLY FROM THE NECK DOWN.   Do not use on face/ open                           Wound or open sores. Avoid contact with eyes, ears mouth and genitals (private parts).                       Wash face,  Genitals (private parts) with your normal soap.             6.  Wash thoroughly, paying special attention to the area where your surgery  will be performed.  7.  Thoroughly rinse your body with warm water from the neck down.  8.  DO NOT shower/wash with your normal soap after using and rinsing off  the CHG  Soap.                9.  Pat yourself dry with a clean towel.            10.  Wear clean pajamas.            11.  Place clean sheets on your bed the night of your first shower and do not  sleep with pets. Day of Surgery : Do not apply any lotions/deodorants the morning of surgery.  Please wear clean clothes to the hospital/surgery center.  FAILURE TO FOLLOW THESE INSTRUCTIONS MAY RESULT IN THE CANCELLATION OF YOUR SURGERY PATIENT SIGNATURE_________________________________  NURSE SIGNATURE__________________________________  ________________________________________________________________________

## 2020-03-15 ENCOUNTER — Other Ambulatory Visit: Payer: Self-pay

## 2020-03-15 ENCOUNTER — Other Ambulatory Visit: Payer: Self-pay | Admitting: Neurology

## 2020-03-15 ENCOUNTER — Encounter (HOSPITAL_COMMUNITY): Payer: Self-pay

## 2020-03-15 ENCOUNTER — Encounter (HOSPITAL_COMMUNITY)
Admission: RE | Admit: 2020-03-15 | Discharge: 2020-03-15 | Disposition: A | Discharge status: Home / Self Care | Payer: 59 | Source: Ambulatory Visit | Attending: Gynecologic Oncology | Admitting: Gynecologic Oncology

## 2020-03-15 DIAGNOSIS — Z01812 Encounter for preprocedural laboratory examination: Secondary | ICD-10-CM | POA: Diagnosis not present

## 2020-03-15 HISTORY — DX: Acute myocardial infarction, unspecified: I21.9

## 2020-03-15 NOTE — Progress Notes (Signed)
COVID Vaccine Completed:No Date COVID Vaccine completed: COVID vaccine manufacturer: [place]   PCP - No PCP Cardiologist - Dr. Debara Pickett K: Clearance by Roby Lofts PAC: 02/06/20. EPIC  Chest x-ray - 11/14/19 EKG - 11/17/19 EPIC Stress Test -  ECHO - 2019 Cardiac Cath -   Sleep Study -  CPAP -   Fasting Blood Sugar -  Checks Blood Sugar _____ times a day  Blood Thinner Instructions: Aspirin Instructions: Last Dose:  Anesthesia review: Hx. HTN,CAD,CKD III,NSEMI  Patient denies shortness of breath, fever, cough and chest pain at PAT appointment   Patient verbalized understanding of instructions that were given to them at the PAT appointment. Patient was also instructed that they will need to review over the PAT instructions again at home before surgery.

## 2020-03-19 ENCOUNTER — Other Ambulatory Visit (HOSPITAL_COMMUNITY): Payer: 59

## 2020-03-23 ENCOUNTER — Other Ambulatory Visit: Payer: Self-pay

## 2020-03-23 ENCOUNTER — Encounter (HOSPITAL_COMMUNITY)
Admission: RE | Admit: 2020-03-23 | Discharge: 2020-03-23 | Disposition: A | Discharge status: Home / Self Care | Payer: 59 | Source: Ambulatory Visit | Attending: Gynecologic Oncology | Admitting: Gynecologic Oncology

## 2020-03-23 DIAGNOSIS — Z01812 Encounter for preprocedural laboratory examination: Secondary | ICD-10-CM | POA: Diagnosis present

## 2020-03-23 LAB — COMPREHENSIVE METABOLIC PANEL
ALT: 15 U/L (ref 0–44)
AST: 21 U/L (ref 15–41)
Albumin: 4.2 g/dL (ref 3.5–5.0)
Alkaline Phosphatase: 54 U/L (ref 38–126)
Anion gap: 9 (ref 5–15)
BUN: 12 mg/dL (ref 6–20)
CO2: 24 mmol/L (ref 22–32)
Calcium: 9 mg/dL (ref 8.9–10.3)
Chloride: 105 mmol/L (ref 98–111)
Creatinine, Ser: 0.8 mg/dL (ref 0.44–1.00)
GFR calc Af Amer: >60 mL/min (ref >60)
GFR calc non Af Amer: >60 mL/min (ref >60)
Glucose, Bld: 103 mg/dL — ABNORMAL HIGH (ref 70–99)
Potassium: 3.6 mmol/L (ref 3.5–5.1)
Sodium: 138 mmol/L (ref 135–145)
Total Bilirubin: 0.4 mg/dL (ref 0.3–1.2)
Total Protein: 7 g/dL (ref 6.5–8.1)

## 2020-03-23 LAB — CBC
HCT: 34.8 % — ABNORMAL LOW (ref 36.0–46.0)
Hemoglobin: 11.5 g/dL — ABNORMAL LOW (ref 12.0–15.0)
MCH: 30.6 pg (ref 26.0–34.0)
MCHC: 33 g/dL (ref 30.0–36.0)
MCV: 92.6 fL (ref 80.0–100.0)
Platelets: 304 10*3/uL (ref 150–400)
RBC: 3.76 MIL/uL — ABNORMAL LOW (ref 3.87–5.11)
RDW: 15.9 % — ABNORMAL HIGH (ref 11.5–15.5)
WBC: 8.4 10*3/uL (ref 4.0–10.5)
nRBC: 0 % (ref 0.0–0.2)

## 2020-03-23 LAB — URINALYSIS, ROUTINE W REFLEX MICROSCOPIC
Bilirubin Urine: NEGATIVE
Glucose, UA: NEGATIVE mg/dL
Hgb urine dipstick: NEGATIVE
Ketones, ur: NEGATIVE mg/dL
Leukocytes,Ua: NEGATIVE
Nitrite: NEGATIVE
Protein, ur: NEGATIVE mg/dL
Specific Gravity, Urine: 1.006 (ref 1.005–1.030)
pH: 5 (ref 5.0–8.0)

## 2020-03-24 NOTE — Anesthesia Preprocedure Evaluation (Addendum)
Anesthesia Evaluation  Patient identified by MRN, date of birth, ID band Patient awake    Reviewed: Allergy & Precautions, NPO status , Patient's Chart, lab work & pertinent test results, reviewed documented beta blocker date and time   History of Anesthesia Complications Negative for: history of anesthetic complications  Airway Mallampati: I  TM Distance: >3 FB Neck ROM: Full    Dental  (+) Dental Advisory Given   Pulmonary former smoker,  03/26/2020 SARS coronavirus NEG   breath sounds clear to auscultation       Cardiovascular hypertension, Pt. on medications and Pt. on home beta blockers (-) angina+ CAD, + Past MI and + Cardiac Stents   Rhythm:Regular Rate:Normal  '19 ECHO: moderate LVH with an increased LVEF of 65-70%, valves OK   Neuro/Psych  Headaches, Anxiety Depression Bipolar Disorder H/o transverse myelitis: lower extrem weakness    GI/Hepatic negative GI ROS, Neg liver ROS,   Endo/Other  negative endocrine ROS  Renal/GU Renal InsufficiencyRenal disease     Musculoskeletal   Abdominal   Peds  Hematology negative hematology ROS (+)   Anesthesia Other Findings Breast cancer  Reproductive/Obstetrics                          Anesthesia Physical Anesthesia Plan  ASA: III  Anesthesia Plan: General   Post-op Pain Management:    Induction: Intravenous  PONV Risk Score and Plan: 4 or greater and Scopolamine patch - Pre-op, Dexamethasone and Ondansetron  Airway Management Planned: Oral ETT  Additional Equipment:   Intra-op Plan:   Post-operative Plan: Extubation in OR  Informed Consent: I have reviewed the patients History and Physical, chart, labs and discussed the procedure including the risks, benefits and alternatives for the proposed anesthesia with the patient or authorized representative who has indicated his/her understanding and acceptance.     Dental advisory  given  Plan Discussed with: CRNA and Surgeon  Anesthesia Plan Comments: (See PAT note 03/23/2020, Konrad Felix, PA-C)      Anesthesia Quick Evaluation

## 2020-03-24 NOTE — Progress Notes (Signed)
Anesthesia Chart Review   Case: [id number] Date/Time: 03/30/20 0700   Procedures:      XI ROBOTIC ASSISTED SALPINGO OOPHORECTOMY (Bilateral )     EXCISION MASS  VAGINA (N/A )   Anesthesia type: General   Pre-op diagnosis: ESTROGEN RECEPTOR POSITIVE BREAST CANCER   Location: [facility] ROOM 03 / [facility]   Surgeons: Lafonda Mosses, MD      DISCUSSION:49 y.o. former smoker (15 pack years, quit 10/05/15) with h/o GERD, HTN, HLD, hypertrophic cardiomyopathy, breast cancer s/p lumpectomy and radiation 2018, CKD Stage III, CAD (DES to RCA 2018) scheduled for above procedure 03/30/2020 with Dr. Jeral Pinch.   Pt last seen by cardiology 02/09/2020 for preoperative evaluation.  Per OV note, "Upcoming robotic BSO, possible laparotomy, and excision of the right labia minora lesion by Dr. Jeral Pinch.  She has no recent chest discomfort.  Functional ability limited by chronic lower extremity weakness secondary to transverse myelitis.  Her last PCI was in March 2018.  Her last echocardiogram in 2019 was normal.  At this time, given lack of chest discomfort, she does not require any further work-up.  If needed, she may hold aspirin for 5 to 7 days prior to the procedure and restart as soon as possible afterward.  We will further decrease her overall risk by adding low-dose metoprolol tartrate to her medical regimen"  Anticipate pt can proceed with planned procedure barring acute status change.   VS: BP 134/82   Pulse (!) 57   Temp 37 C (Oral)   Resp 16   Ht 5' 1.5" (1.562 m)   Wt 66.2 kg   LMP 03/06/2020   SpO2 100%   BMI 27.14 kg/m   PROVIDERS: Patient, No Pcp Per  Lyman Bishop, MD is Cardiologist  LABS: Labs reviewed: Acceptable for surgery. (all labs ordered are listed, but only abnormal results are displayed)  Labs Reviewed  CBC - Abnormal; Notable for the following components:      Result Value   RBC 3.76 (*)    Hemoglobin 11.5 (*)    HCT 34.8 (*)    RDW 15.9 (*)    All other  components within normal limits  COMPREHENSIVE METABOLIC PANEL - Abnormal; Notable for the following components:   Glucose, Bld 103 (*)    All other components within normal limits  URINALYSIS, ROUTINE W REFLEX MICROSCOPIC - Abnormal; Notable for the following components:   Color, Urine STRAW (*)    All other components within normal limits  TYPE AND SCREEN     IMAGES:   EKG: 11/17/2019 Rate 88 bpm  NSR Nonspecific ST abnormality   CV: Echo 07/29/2018 Study Conclusions   - Left ventricle: The cavity size was normal. Wall thickness was  increased in a pattern of moderate LVH. Systolic function was  vigorous. The estimated ejection fraction was in the range of 65%  to 70%. Grossly normal longitudinal LV strain at -18%. Wall  motion was normal; there were no regional wall motion  abnormalities. Doppler parameters are consistent with abnormal  left ventricular relaxation (grade 1 diastolic dysfunction). The  E/e&' ratio is between 8-15, suggesting indeterminate LV filling  pressure.  - Aortic valve: Sclerosis without stenosis. There was no  regurgitation.  - Mitral valve: Mildly thickened leaflets . There was trivial  regurgitation.  - Left atrium: The atrium was mildly dilated.  - Inferior vena cava: The vessel was normal in size. The  respirophasic diameter changes were in the normal range (>= 50%),  consistent with normal central venous pressure.   Impressions:   - Compared to a prior study in 09/2017, there is now moderate LVH  with an increased LVEF of 65-70%, grade 1 DD and indeterminate LV  filling pressure.   Cardiac Cath 12/31/2016 1. Acute total occlusion of the RCA, treated successful with PCI using a 2.75x38 mm Promus DES, deployed in overlapping fashion with the old stent which was widely patent 2. Mild nonobstructive LAD/LCx stenosis 3. Mild segmental LV systolic dysfunction with severe hypokinesis of the basal and mid inferior  walls, but vigorous contractility of the anterior and apical walls, LVEF estimated 50-55%  Recommend:  DAPT with ASA and brilinta at least 12 months without interruption   Myocardial Perfusion 11/14/2016  The left ventricular ejection fraction is normal (55-65%).  Nuclear stress EF: 56%.  There was no ST segment deviation noted during stress.  This is a low risk study. No ischemia identified    Past Medical History:  Diagnosis Date  . Anemia   . Anxiety   . Bipolar disorder in full remission ([place])   . CAD in native artery cardiologist--  dr hilty   a. 09/2014 Cath/PCI in setting of [place] - s/p 2.25 x 12 mm Promus Premier DES to mid RCA;  b. 11/2016 Myoview: EF 56%, no ischemia;  c. 12/2016 NSTEMI/PCI: LM nl, LAd 25p, LCX 30p, RCA 100p (2.75x38 Promus Premier DES), mRCA 10 ISR, EF 50-55%.  . CKD (chronic kidney disease), stage III   . Depression   . Genetic testing 04/23/2017   Ms. Truszkowski underwent genetic counseling and testing for hereditary cancer syndromes on 04/05/2017. Her results were negative for mutations in all 46 genes analyzed by [facility]'s 46-gene Common Hereditary Cancers Panel. Genes analyzed include: APC, ATM, AXIN2, BARD1, BMPR1A, BRCA1, BRCA2, BRIP1, CDH1, CDKN2A, CHEK2, CTNNA1, DICER1, EPCAM, GREM1, HOXB13, KIT, MEN1, MLH1, MSH2, MSH3, MSH6, MUTYH, NBN,  . GERD (gastroesophageal reflux disease)   . Headache   . History of external beam radiation therapy    right breast 07-27-2017  to 09-25-2017  . History of non-ST elevation myocardial infarction (NSTEMI)   . HOCM (hypertrophic obstructive cardiomyopathy) ([employer]) followed by cardiology   a. 12/2016 Echo: EF 65-70%,  mod conc LVH, dynamic obstruction @ rest, peak velocity of 291 cm/sec w/ peak gradient of 81mHg, no rwma, Gr1 DD, triv TR, PASP 131mg.  . Marland Kitchenyperlipidemia   . Hypertension   . Malignant neoplasm of lower-outer quadrant of right breast of female, estrogen receptor positive [facility] gu[facility] Adie  dx 06/ 2018--- right breast invasive lobular carcinoma, ductal carcinoma w/ LCIS---- 06-21-2017 s/p right breast lumpecotmy w/ sln disseciton;   completed radiation 09-25-2017;  on tamoxifen  . [place]ocardial infarction ([facility]   2017  . S/P drug eluting coronary stent placement    [facility]14-2014[facility]  PCI with DES x1 to proxRCA  . Trans[facility] dr saFelecia Shelling 01/ 2017  dx transverse myelitis w/ right side numbness (09-01-2019  currently lower extremity weakness, mucsle spasms, and gait disturbance)  . Wears glasses     Past Surgical History:  Procedure Laterality Date  . BREAST LUMPECTOMY WITH RADIOACTIVE SEED AND SENTINEL LYMPH NODE BIOPSY Right 06/21/2017   Procedure: RIGHT BREAST BRACKETED SEED GUIDED LUMPECTOMY AND SENTINEL LYMPH NODE BIOPSY;  Surgeon: ToJovita KussmaulMD;  [facility] Service: General;  Laterality: Right;  . CORONARY/GRAFT ACUTE MI REVASCULARIZATION N/A 12/31/2016  Procedure: Coronary/Graft Acute MI Revascularization;  Surgeon: Sherren Mocha, MD;  Location: [place] [facility];  Service: Cardiovascular;  Laterality: N/A;  . DILATATION & CURETTAGE/HYSTEROSCOPY WITH MYOSURE N/A 09/02/2019   Procedure: DILATATION & CURETTAGE/HYSTEROSCOPY WITH MYOSURE;  Surgeon: Aloha Gell, MD;  Location: [place];  Service: Gynecology;  Laterality: N/A;  . HERNIA REPAIR    . LEFT HEART CATH AND CORONARY ANGIOGRAPHY N/A 12/31/2016   Procedure: Left Heart Cath and Coronary Angiography;  Surgeon: Sherren Mocha, MD;  Location: [place] [facility] LAB;  Service: Cardiovascular;  Laterality: N/A;  . LEFT HEART CATHETERIZATION WITH CORONARY ANGIOGRAM N/A 09/14/2014   Procedure: LEFT HEART CATHETERIZATION WITH CORONARY ANGIOGRAM;  Surgeon: Burnell Blanks, MD;  Location: [facility] CATH LAB;  Service: Cardiovascular;  Laterality: N/A;  . PERCUTANEOUS CORONARY STENT INTERVENTION (PCI-S)  09/14/2014   Procedure: PERCUTANEOUS  CORONARY STENT INTERVENTION (PCI-S);  Surgeon: Burnell Blanks, MD;  Location: [facility] CATH LAB;  Service: Cardiovascular;;  . TONSILLECTOMY  2005  . UMBILICAL HERNIA REPAIR  2001; 2005    MEDICATIONS: . acetaminophen (TYLENOL) 325 MG tablet  . amLODipine (NORVASC) 10 MG tablet  . aspirin 81 MG chewable tablet  . atorvastatin (LIPITOR) 80 MG tablet  . baclofen (LIORESAL) 10 MG tablet  . celecoxib (CELEBREX) 200 MG capsule  . ferrous sulfate 325 (65 FE) MG tablet  . fluticasone (FLONASE) 50 MCG/ACT nasal spray  . furosemide (LASIX) 20 MG tablet  . gabapentin (NEURONTIN) 300 MG capsule  . lisinopril (ZESTRIL) 20 MG tablet  . metoprolol tartrate (LOPRESSOR) 25 MG tablet  . nitroGLYCERIN (NITROSTAT) 0.4 MG SL tablet  . tamoxifen (NOLVADEX) 20 MG tablet  . traMADol (ULTRAM) 50 MG tablet   No current facility-administered medications for this encounter.    Maia [facility] Pre-Surgical Testing [phone] 03/24/20  3:03 PM

## 2020-03-25 ENCOUNTER — Telehealth: Payer: Self-pay

## 2020-03-25 ENCOUNTER — Other Ambulatory Visit: Payer: Self-pay

## 2020-03-25 ENCOUNTER — Inpatient Hospital Stay: Payer: 59 | Attending: Hematology and Oncology | Admitting: Gynecologic Oncology

## 2020-03-25 VITALS — BP 148/89 | HR 64 | Temp 98.5°F | Resp 16 | Ht 61.5 in | Wt 143.1 lb

## 2020-03-25 DIAGNOSIS — C50511 Malignant neoplasm of lower-outer quadrant of right female breast: Secondary | ICD-10-CM

## 2020-03-25 DIAGNOSIS — Z170 Estrogen receptor positive status [ER+]: Secondary | ICD-10-CM

## 2020-03-25 MED ORDER — IBUPROFEN 600 MG PO TABS: 600.0000 mg | ORAL_TABLET | Freq: Four times a day (QID) | ORAL | 0 refills | Status: DC | PRN

## 2020-03-25 MED ORDER — OXYCODONE HCL 5 MG PO TABS: 5.0000 mg | ORAL_TABLET | ORAL | 0 refills | Status: DC | PRN

## 2020-03-25 MED ORDER — SENNOSIDES-DOCUSATE SODIUM 8.6-50 MG PO TABS: 2.0000 | ORAL_TABLET | Freq: Every day | ORAL | 0 refills | Status: DC

## 2020-03-25 NOTE — Patient Instructions (Addendum)
STOP TAKING YOUR TAMOXIFEN AND CELEBREX NOW.  YOU CAN CONTINUE TAKING ASPIRIN 81 MG UP UNTIL THE DAY BEFORE SURGERY.  Preparing for your Surgery  Plan for surgery on March 30, 2020 with Dr. Jeral Pinch at [place] will be scheduled for a robotic assisted laparoscopic bilateral salpingo-oophorectomy, right labial minora lesion excision.   Pre-operative Testing -You will receive a phone call from presurgical testing at [facility] to arrange for a pre-operative appointment over the phone, lab appointment, and COVID test. The COVID test normally happens 3 days prior to the surgery and they ask that you self quarantine after the test up until surgery to decrease chance of exposure.  -Bring your insurance card, copy of an advanced directive if applicable, medication list  -At that visit, you will be asked to sign a consent for a possible blood transfusion in case a transfusion becomes necessary during surgery.  The need for a blood transfusion is rare but having consent is a necessary part of your care.     -You should not be taking blood thinners or aspirin at least ten days prior to surgery unless instructed by your surgeon.  -Do not take supplements such as fish oil (omega 3), red yeast rice, turmeric before your surgery.   Day Before Surgery at [place] will be asked to take in a light diet the day before surgery. You will be advised you can have clear liquids after midnight and up until 3 hours before your surgery.    Eat a light diet the day before surgery.  Examples including soups, broths, toast, yogurt, mashed potatoes.  AVOID GAS PRODUCING FOODS. Things to avoid include carbonated beverages (fizzy beverages), raw fruits and raw vegetables, or beans.   If your bowels are filled with gas, your surgeon will have difficulty visualizing your pelvic organs which increases your surgical risks.  Your role in recovery Your role is to become active as soon as  directed by your doctor, while still giving yourself time to heal.  Rest when you feel tired. You will be asked to do the following in order to speed your recovery:  - Cough and breathe deeply. This helps to clear and expand your lungs and can prevent pneumonia after surgery.  - [place]. Do mild physical activity. Walking or moving your legs help your circulation and body functions return to normal. Do not try to get up or walk alone the first time after surgery.   -If you develop swelling on one leg or the other, pain in the back of your leg, redness/warmth in one of your legs, please call the office or go to the Emergency Room to have a doppler to rule out a blood clot. For shortness of breath, chest pain-seek care in the Emergency Room as soon as possible. - Actively manage your pain. Managing your pain lets you move in comfort. We will ask you to rate your pain on a scale of zero to 10. It is your responsibility to tell your doctor or nurse where and how much you hurt so your pain can be treated.  Special Considerations -If you are diabetic, you may be placed on insulin after surgery to have closer control over your blood sugars to promote healing and recovery.  This does not mean that you will be discharged on insulin.  If applicable, your oral antidiabetics will be resumed when you are tolerating a solid diet.  -Your final pathology results from  surgery should be available around one week after surgery and the results will be relayed to you when available.  -Dr. Lahoma Crocker is the surgeon that assists your GYN Oncologist with surgery.  If you end up staying the night, the next day after your surgery you will either see Dr. Denman George, Dr. Berline Lopes, or Dr. Lahoma Crocker.  -FMLA forms can be faxed to [phone] and please allow 5-7 business days for completion.  Pain Management After Surgery -You have been prescribed your pain medication and bowel regimen medications  before surgery so that you can have these available when you are discharged from the hospital. The pain medication is for use ONLY AFTER surgery and a new prescription will not be given.   -Make sure that you have Tylenol and Ibuprofen at home to use on a regular basis after surgery for pain control. We recommend alternating the medications every hour to six hours since they work differently and are processed in the body differently for pain relief.  -Review the attached handout on narcotic use and their risks and side effects.   Bowel Regimen -You have been prescribed Sennakot-S to take nightly to prevent constipation especially if you are taking the narcotic pain medication intermittently.  It is important to prevent constipation and drink adequate amounts of liquids. You can stop taking this medication when you are not taking pain medication and you are back on your normal bowel routine.  Risks of Surgery Risks of surgery are low but include bleeding, infection, damage to surrounding structures, re-operation, blood clots, and very rarely death.   Blood Transfusion Information (For the consent to be signed before surgery)  We will be checking your blood type before surgery so in case of emergencies, we will know what type of blood you would need.                                            WHAT IS A BLOOD TRANSFUSION?  A transfusion is the replacement of blood or some of its parts. Blood is made up of multiple cells which provide different functions.  Red blood cells carry oxygen and are used for blood loss replacement.  White blood cells fight against infection.  Platelets control bleeding.  Plasma helps clot blood.  Other blood products are available for specialized needs, such as hemophilia or other clotting disorders. BEFORE THE TRANSFUSION  Who gives blood for transfusions?   You may be able to donate blood to be used at a later date on yourself (autologous  donation).  Relatives can be asked to donate blood. This is generally not any safer than if you have received blood from a stranger. The same precautions are taken to ensure safety when a relative's blood is donated.  Healthy volunteers who are fully evaluated to make sure their blood is safe. This is blood bank blood. Transfusion therapy is the safest it has ever been in the practice of medicine. Before blood is taken from a donor, a complete history is taken to make sure that person has no history of diseases nor engages in risky social behavior (examples are intravenous drug use or sexual activity with multiple partners). The donor's travel history is screened to minimize risk of transmitting infections, such as malaria. The donated blood is tested for signs of infectious diseases, such as HIV and hepatitis. The blood is then tested to  be sure it is compatible with you in order to minimize the chance of a transfusion reaction. If you or a relative donates blood, this is often done in anticipation of surgery and is not appropriate for emergency situations. It takes many days to process the donated blood. RISKS AND COMPLICATIONS Although transfusion therapy is very safe and saves many lives, the main dangers of transfusion include:   Getting an infectious disease.  Developing a transfusion reaction. This is an allergic reaction to something in the blood you were given. Every precaution is taken to prevent this. The decision to have a blood transfusion has been considered carefully by your caregiver before blood is given. Blood is not given unless the benefits outweigh the risks.  AFTER SURGERY INSTRUCTIONS  Return to work: 4-6 weeks if applicable  Activity: 1. Be up and out of the bed during the day.  Take a nap if needed.  You may walk up steps but be careful and use the hand rail.  Stair climbing will tire you more than you think, you may need to stop part way and rest.   2. No lifting or  straining for 6 weeks over 10 pounds. No pushing, pulling, straining for 6 weeks.  3. No driving for 1 week(s).  Do not drive if you are taking narcotic pain medicine and make sure that your reaction time has returned.   4. You can shower as soon as the next day after surgery. Shower daily.  Use soap and water on your incision and pat dry; don't rub.  No tub baths or submerging your body in water until cleared by your surgeon. If you have the soap that was given to you by pre-surgical testing that was used before surgery, you do not need to use it afterwards because this can irritate your incisions.   5. No sexual activity and nothing in the vagina for 4 weeks.  6. You may experience a small amount of clear drainage from your incisions, which is normal.  If the drainage persists, increases, or changes color please call the office.  7. Do not use creams, lotions, or ointments such as neosporin on your incisions after surgery until advised by your surgeon because they can cause removal of the dermabond glue on your incisions.    8. You may experience vaginal spotting after surgery. The spotting is normal but if you experience heavy bleeding, call our office.  9. Take Tylenol or ibuprofen first for pain and only use narcotic pain medication for severe pain not relieved by the Tylenol or Ibuprofen.  Monitor your Tylenol intake to a max of 4,000 mg in a 24 hour period. You can alternate these medications after surgery.  Diet: 1. Low sodium Heart Healthy Diet is recommended.  2. It is safe to use a laxative, such as Miralax or Colace, if you have difficulty moving your bowels. You have been prescribed Sennakot at bedtime every evening to keep bowel movements regular and to prevent constipation.    Wound Care: 1. Keep clean and dry.  Shower daily.  Reasons to call the Doctor:  Fever - Oral temperature greater than 100.4 degrees Fahrenheit  Foul-smelling vaginal discharge  Difficulty  urinating  Nausea and vomiting  Increased pain at the site of the incision that is unrelieved with pain medicine.  Difficulty breathing with or without chest pain  New calf pain especially if only on one side  Sudden, continuing increased vaginal bleeding with or without clots.   Contacts:  For questions or concerns you should contact:  Dr. Jeral Pinch at [phone]  Joylene John, NP at [phone]  After Hours: call [phone] and have the GYN Oncologist paged/contacted

## 2020-03-25 NOTE — Progress Notes (Signed)
Patient here alone for a pre-operative appointment prior to her scheduled surgery on March 30, 2020. She is scheduled for a robotic assisted laparoscopic bilateral salpingo-oophorectomy, right labial minora lesion excision .  She had her pre-admission testing appointment over the phone.  The surgery was discussed in detail.  See after visit summary for additional details. Visual aids used to discuss items related to surgery including sequential compression stockings, foley catheter, IV pump, multi-modal pain regimen including tylenol, photo of the surgical robot, female reproductive system to discuss surgery in detail.      Discussed post-op pain management in detail including the aspects of the enhanced recovery pathway.  Advised her that a new prescription would be sent in for oxycodone and it is only to be used for after her upcoming surgery.  We discussed the use of tylenol post-op and to monitor for a maximum of 4,000 mg in a 24 hour period.  Also prescribed sennakot to be used after surgery and to hold if having loose stools.  Discussed bowel regimen in detail.     Discussed the use of SCDs, and measures to take at home to prevent DVT including frequent mobility.  Reportable signs and symptoms of DVT discussed. Post-operative instructions discussed and expectations for after surgery. Incisional care discussed as well including reportable signs and symptoms including erythema, drainage, wound separation.     8 minutes spent with the patient.  Verbalizing understanding of material discussed. No needs or concerns voiced at the end of the visit. Advised patient to call for any needs.  Advised that her post-operative medications had been prescribed and could be picked up at any time.

## 2020-03-25 NOTE — Telephone Encounter (Signed)
ENCOUNTER OPENED IN ERROR

## 2020-03-26 ENCOUNTER — Other Ambulatory Visit (HOSPITAL_COMMUNITY): Payer: 59

## 2020-03-26 ENCOUNTER — Other Ambulatory Visit (HOSPITAL_COMMUNITY)
Admission: RE | Admit: 2020-03-26 | Discharge: 2020-03-26 | Disposition: A | Discharge status: Home / Self Care | Payer: 59 | Source: Ambulatory Visit | Attending: Gynecologic Oncology | Admitting: Gynecologic Oncology

## 2020-03-26 DIAGNOSIS — Z01812 Encounter for preprocedural laboratory examination: Secondary | ICD-10-CM | POA: Insufficient documentation

## 2020-03-26 DIAGNOSIS — Z20822 Contact with and (suspected) exposure to covid-19: Secondary | ICD-10-CM | POA: Diagnosis not present

## 2020-03-26 LAB — SARS CORONAVIRUS 2 (TAT 6-24 HRS): SARS Coronavirus 2: NEGATIVE

## 2020-03-29 ENCOUNTER — Telehealth: Payer: Self-pay

## 2020-03-29 NOTE — Telephone Encounter (Signed)
Spoke with Ms Eastham and she understands her written Pre-Op instructions. Told her that she needs to continue to hold tamoxifen as directed by Melissa Cross,NP. Last dose of ASA today . Hold tomorrow's dose. Pt verbalized understanding.

## 2020-03-30 ENCOUNTER — Encounter (HOSPITAL_COMMUNITY): Payer: Self-pay | Admitting: Gynecologic Oncology

## 2020-03-30 ENCOUNTER — Encounter (HOSPITAL_COMMUNITY)
Admission: RE | Disposition: A | Discharge status: Home / Self Care | Payer: Self-pay | Source: Home / Self Care | Attending: Gynecologic Oncology

## 2020-03-30 ENCOUNTER — Ambulatory Visit (HOSPITAL_COMMUNITY): Payer: 59 | Admitting: Physician Assistant

## 2020-03-30 ENCOUNTER — Other Ambulatory Visit: Payer: Self-pay

## 2020-03-30 ENCOUNTER — Ambulatory Visit (HOSPITAL_COMMUNITY): Payer: 59 | Admitting: Registered Nurse

## 2020-03-30 ENCOUNTER — Ambulatory Visit (HOSPITAL_COMMUNITY)
Admission: RE | Admit: 2020-03-30 | Discharge: 2020-03-30 | Disposition: A | Discharge status: Home / Self Care | Payer: 59 | Attending: Gynecologic Oncology | Admitting: Gynecologic Oncology

## 2020-03-30 DIAGNOSIS — D631 Anemia in chronic kidney disease: Secondary | ICD-10-CM | POA: Diagnosis not present

## 2020-03-30 DIAGNOSIS — N838 Other noninflammatory disorders of ovary, fallopian tube and broad ligament: Secondary | ICD-10-CM | POA: Insufficient documentation

## 2020-03-30 DIAGNOSIS — Z955 Presence of coronary angioplasty implant and graft: Secondary | ICD-10-CM | POA: Diagnosis not present

## 2020-03-30 DIAGNOSIS — Z87891 Personal history of nicotine dependence: Secondary | ICD-10-CM | POA: Insufficient documentation

## 2020-03-30 DIAGNOSIS — N907 Vulvar cyst: Secondary | ICD-10-CM | POA: Diagnosis not present

## 2020-03-30 DIAGNOSIS — I421 Obstructive hypertrophic cardiomyopathy: Secondary | ICD-10-CM | POA: Diagnosis not present

## 2020-03-30 DIAGNOSIS — Z170 Estrogen receptor positive status [ER+]: Secondary | ICD-10-CM | POA: Insufficient documentation

## 2020-03-30 DIAGNOSIS — C50511 Malignant neoplasm of lower-outer quadrant of right female breast: Secondary | ICD-10-CM

## 2020-03-30 DIAGNOSIS — C50411 Malignant neoplasm of upper-outer quadrant of right female breast: Secondary | ICD-10-CM | POA: Diagnosis not present

## 2020-03-30 DIAGNOSIS — N83291 Other ovarian cyst, right side: Secondary | ICD-10-CM

## 2020-03-30 DIAGNOSIS — I252 Old myocardial infarction: Secondary | ICD-10-CM | POA: Diagnosis not present

## 2020-03-30 DIAGNOSIS — Z4002 Encounter for prophylactic removal of ovary: Secondary | ICD-10-CM | POA: Diagnosis not present

## 2020-03-30 DIAGNOSIS — I129 Hypertensive chronic kidney disease with stage 1 through stage 4 chronic kidney disease, or unspecified chronic kidney disease: Secondary | ICD-10-CM | POA: Insufficient documentation

## 2020-03-30 DIAGNOSIS — N183 Chronic kidney disease, stage 3 unspecified: Secondary | ICD-10-CM | POA: Insufficient documentation

## 2020-03-30 DIAGNOSIS — N8302 Follicular cyst of left ovary: Secondary | ICD-10-CM | POA: Insufficient documentation

## 2020-03-30 DIAGNOSIS — I2510 Atherosclerotic heart disease of native coronary artery without angina pectoris: Secondary | ICD-10-CM | POA: Insufficient documentation

## 2020-03-30 DIAGNOSIS — Z79899 Other long term (current) drug therapy: Secondary | ICD-10-CM | POA: Diagnosis not present

## 2020-03-30 DIAGNOSIS — E785 Hyperlipidemia, unspecified: Secondary | ICD-10-CM | POA: Diagnosis not present

## 2020-03-30 DIAGNOSIS — Z7982 Long term (current) use of aspirin: Secondary | ICD-10-CM | POA: Diagnosis not present

## 2020-03-30 DIAGNOSIS — N8301 Follicular cyst of right ovary: Secondary | ICD-10-CM | POA: Diagnosis not present

## 2020-03-30 DIAGNOSIS — Z79810 Long term (current) use of selective estrogen receptor modulators (SERMs): Secondary | ICD-10-CM | POA: Diagnosis not present

## 2020-03-30 DIAGNOSIS — Z923 Personal history of irradiation: Secondary | ICD-10-CM | POA: Diagnosis not present

## 2020-03-30 DIAGNOSIS — L720 Epidermal cyst: Secondary | ICD-10-CM | POA: Insufficient documentation

## 2020-03-30 HISTORY — PX: MASS EXCISION: SHX2000

## 2020-03-30 HISTORY — PX: ROBOTIC ASSISTED SALPINGO OOPHERECTOMY: SHX6082

## 2020-03-30 LAB — TYPE AND SCREEN
ABO/RH(D): B POS
Antibody Screen: NEGATIVE

## 2020-03-30 LAB — PREGNANCY, URINE: Preg Test, Ur: NEGATIVE

## 2020-03-30 SURGERY — SALPINGO-OOPHORECTOMY, ROBOT-ASSISTED
Anesthesia: General

## 2020-03-30 MED ORDER — DEXAMETHASONE SODIUM PHOSPHATE 4 MG/ML IJ SOLN: 4.0000 mg | INTRAMUSCULAR | Status: DC

## 2020-03-30 MED ORDER — FENTANYL CITRATE (PF) 250 MCG/5ML IJ SOLN
INTRAMUSCULAR | Status: DC | PRN
  Administered 2020-03-30: 150 ug via INTRAVENOUS
  Administered 2020-03-30 (×2): 50 ug via INTRAVENOUS

## 2020-03-30 MED ORDER — LACTATED RINGERS IR SOLN
Status: DC | PRN
  Administered 2020-03-30: 1000 mL

## 2020-03-30 MED ORDER — ROCURONIUM BROMIDE 10 MG/ML (PF) SYRINGE
PREFILLED_SYRINGE | INTRAVENOUS | Status: DC | PRN
  Administered 2020-03-30: 60 mg via INTRAVENOUS

## 2020-03-30 MED ORDER — MIDAZOLAM HCL 2 MG/2ML IJ SOLN: 0.5000 mg | Freq: Once | INTRAMUSCULAR | Status: DC | PRN

## 2020-03-30 MED ORDER — BUPIVACAINE HCL 0.25 % IJ SOLN
INTRAMUSCULAR | Status: AC
  Filled 2020-03-30: qty 1

## 2020-03-30 MED ORDER — ONDANSETRON HCL 4 MG/2ML IJ SOLN
INTRAMUSCULAR | Status: AC
  Filled 2020-03-30: qty 2

## 2020-03-30 MED ORDER — DEXAMETHASONE SODIUM PHOSPHATE 10 MG/ML IJ SOLN
INTRAMUSCULAR | Status: AC
  Filled 2020-03-30: qty 1

## 2020-03-30 MED ORDER — CHLORHEXIDINE GLUCONATE 0.12 % MT SOLN
15.0000 mL | Freq: Once | OROMUCOSAL | Status: AC
  Administered 2020-03-30: 15 mL via OROMUCOSAL

## 2020-03-30 MED ORDER — MIDAZOLAM HCL 2 MG/2ML IJ SOLN
INTRAMUSCULAR | Status: AC
  Filled 2020-03-30: qty 2

## 2020-03-30 MED ORDER — SCOPOLAMINE 1 MG/3DAYS TD PT72
1.0000 | MEDICATED_PATCH | TRANSDERMAL | Status: DC
  Administered 2020-03-30: 1.5 mg via TRANSDERMAL
  Filled 2020-03-30: qty 1

## 2020-03-30 MED ORDER — KETAMINE HCL 10 MG/ML IJ SOLN
INTRAMUSCULAR | Status: DC | PRN
  Administered 2020-03-30: 30 mg via INTRAVENOUS

## 2020-03-30 MED ORDER — ACETAMINOPHEN 500 MG PO TABS
1000.0000 mg | ORAL_TABLET | ORAL | Status: AC
  Administered 2020-03-30: 1000 mg via ORAL
  Filled 2020-03-30: qty 2

## 2020-03-30 MED ORDER — SUGAMMADEX SODIUM 200 MG/2ML IV SOLN
INTRAVENOUS | Status: DC | PRN
  Administered 2020-03-30: 200 mg via INTRAVENOUS

## 2020-03-30 MED ORDER — HEPARIN SODIUM (PORCINE) 5000 UNIT/ML IJ SOLN: 5000.0000 [IU] | INTRAMUSCULAR | Status: DC

## 2020-03-30 MED ORDER — GABAPENTIN 300 MG PO CAPS: 300.0000 mg | ORAL_CAPSULE | ORAL | Status: DC

## 2020-03-30 MED ORDER — LIDOCAINE 2% (20 MG/ML) 5 ML SYRINGE
INTRAMUSCULAR | Status: AC
  Filled 2020-03-30: qty 5

## 2020-03-30 MED ORDER — SCOPOLAMINE 1 MG/3DAYS TD PT72: 1.0000 | MEDICATED_PATCH | Freq: Once | TRANSDERMAL | Status: DC

## 2020-03-30 MED ORDER — LIDOCAINE HCL 2 % IJ SOLN
INTRAMUSCULAR | Status: AC
  Filled 2020-03-30: qty 20

## 2020-03-30 MED ORDER — MIDAZOLAM HCL 5 MG/5ML IJ SOLN
INTRAMUSCULAR | Status: DC | PRN
  Administered 2020-03-30: 2 mg via INTRAVENOUS

## 2020-03-30 MED ORDER — SODIUM CHLORIDE 0.9% FLUSH: 3.0000 mL | Freq: Two times a day (BID) | INTRAVENOUS | Status: DC

## 2020-03-30 MED ORDER — ACETAMINOPHEN 500 MG PO TABS: 1000.0000 mg | ORAL_TABLET | Freq: Once | ORAL | Status: DC

## 2020-03-30 MED ORDER — ORAL CARE MOUTH RINSE: 15.0000 mL | Freq: Once | OROMUCOSAL | Status: AC

## 2020-03-30 MED ORDER — HYDROMORPHONE HCL 1 MG/ML IJ SOLN
0.2500 mg | INTRAMUSCULAR | Status: DC | PRN
  Administered 2020-03-30: 0.5 mg via INTRAVENOUS

## 2020-03-30 MED ORDER — LACTATED RINGERS IV SOLN: INTRAVENOUS | Status: DC

## 2020-03-30 MED ORDER — HYDROMORPHONE HCL 1 MG/ML IJ SOLN
INTRAMUSCULAR | Status: AC
  Administered 2020-03-30: 0.5 mg via INTRAVENOUS
  Filled 2020-03-30: qty 1

## 2020-03-30 MED ORDER — PROPOFOL 10 MG/ML IV BOLUS
INTRAVENOUS | Status: AC
  Filled 2020-03-30: qty 20

## 2020-03-30 MED ORDER — OXYCODONE HCL 5 MG/5ML PO SOLN: 5.0000 mg | Freq: Once | ORAL | Status: DC | PRN

## 2020-03-30 MED ORDER — BUPIVACAINE HCL 0.25 % IJ SOLN
INTRAMUSCULAR | Status: DC | PRN
  Administered 2020-03-30: 28 mL

## 2020-03-30 MED ORDER — OXYCODONE HCL 5 MG PO TABS: 5.0000 mg | ORAL_TABLET | Freq: Once | ORAL | Status: DC | PRN

## 2020-03-30 MED ORDER — MEPERIDINE HCL 50 MG/ML IJ SOLN: 6.2500 mg | INTRAMUSCULAR | Status: DC | PRN

## 2020-03-30 MED ORDER — ONDANSETRON HCL 4 MG/2ML IJ SOLN
INTRAMUSCULAR | Status: DC | PRN
  Administered 2020-03-30: 4 mg via INTRAVENOUS

## 2020-03-30 MED ORDER — PROPOFOL 10 MG/ML IV BOLUS
INTRAVENOUS | Status: DC | PRN
  Administered 2020-03-30: 120 mg via INTRAVENOUS
  Administered 2020-03-30: 50 mg via INTRAVENOUS

## 2020-03-30 MED ORDER — DEXAMETHASONE SODIUM PHOSPHATE 10 MG/ML IJ SOLN
INTRAMUSCULAR | Status: DC | PRN
  Administered 2020-03-30: 10 mg via INTRAVENOUS

## 2020-03-30 MED ORDER — ROCURONIUM BROMIDE 10 MG/ML (PF) SYRINGE
PREFILLED_SYRINGE | INTRAVENOUS | Status: AC
  Filled 2020-03-30: qty 10

## 2020-03-30 MED ORDER — LIDOCAINE 20MG/ML (2%) 15 ML SYRINGE OPTIME
INTRAMUSCULAR | Status: DC | PRN
  Administered 2020-03-30: 1.5 mg/kg/h via INTRAVENOUS

## 2020-03-30 MED ORDER — PROMETHAZINE HCL 25 MG/ML IJ SOLN: 6.2500 mg | INTRAMUSCULAR | Status: DC | PRN

## 2020-03-30 MED ORDER — FENTANYL CITRATE (PF) 250 MCG/5ML IJ SOLN
INTRAMUSCULAR | Status: AC
  Filled 2020-03-30: qty 5

## 2020-03-30 MED ORDER — KETAMINE HCL 10 MG/ML IJ SOLN
INTRAMUSCULAR | Status: AC
  Filled 2020-03-30: qty 1

## 2020-03-30 MED ORDER — STERILE WATER FOR IRRIGATION IR SOLN
Status: DC | PRN
  Administered 2020-03-30: 1000 mL

## 2020-03-30 MED ORDER — CELECOXIB 200 MG PO CAPS
400.0000 mg | ORAL_CAPSULE | ORAL | Status: AC
  Administered 2020-03-30: 400 mg via ORAL
  Filled 2020-03-30: qty 2

## 2020-03-30 MED ORDER — LIDOCAINE 2% (20 MG/ML) 5 ML SYRINGE
INTRAMUSCULAR | Status: DC | PRN
  Administered 2020-03-30: 40 mg via INTRAVENOUS

## 2020-03-30 SURGICAL SUPPLY — 66 items
APPLICATOR SURGIFLO ENDO (HEMOSTASIS) IMPLANT
BACTOSHIELD CHG 4% 4OZ (MISCELLANEOUS) ×1
BAG LAPAROSCOPIC 12 15 PORT 16 (BASKET) IMPLANT
BAG RETRIEVAL 12/15 (BASKET)
BAG SPECIMEN  6X9 BHZR ZP PCKT (MISCELLANEOUS) ×1
BAG SPECIMEN 6X9 BHZR ZP PCKT (MISCELLANEOUS) ×2 IMPLANT
BLADE SURG SZ10 CARB STEEL (BLADE) IMPLANT
COVER BACK TABLE 60X90IN (DRAPES) ×3 IMPLANT
COVER TIP SHEARS 8 DVNC (MISCELLANEOUS) ×2 IMPLANT
COVER TIP SHEARS 8MM DA VINCI (MISCELLANEOUS) ×1
COVER WAND RF STERILE (DRAPES) IMPLANT
DECANTER SPIKE VIAL GLASS SM (MISCELLANEOUS) IMPLANT
DERMABOND ADVANCED (GAUZE/BANDAGES/DRESSINGS) ×1
DERMABOND ADVANCED .7 DNX12 (GAUZE/BANDAGES/DRESSINGS) ×2 IMPLANT
DRAPE ARM DVNC X/XI (DISPOSABLE) ×8 IMPLANT
DRAPE COLUMN DVNC XI (DISPOSABLE) ×2 IMPLANT
DRAPE DA VINCI XI ARM (DISPOSABLE) ×4
DRAPE DA VINCI XI COLUMN (DISPOSABLE) ×1
DRAPE SHEET LG 3/4 BI-LAMINATE (DRAPES) ×3 IMPLANT
DRAPE SURG IRRIG POUCH 19X23 (DRAPES) ×3 IMPLANT
DRSG OPSITE POSTOP 4X6 (GAUZE/BANDAGES/DRESSINGS) IMPLANT
DRSG OPSITE POSTOP 4X8 (GAUZE/BANDAGES/DRESSINGS) IMPLANT
ELECT REM PT RETURN 15FT ADLT (MISCELLANEOUS) ×3 IMPLANT
GLOVE BIO SURGEON STRL SZ 6 (GLOVE) ×12 IMPLANT
GLOVE BIO SURGEON STRL SZ 6.5 (GLOVE) ×6 IMPLANT
GOWN STRL REUS W/ TWL LRG LVL3 (GOWN DISPOSABLE) ×8 IMPLANT
GOWN STRL REUS W/TWL LRG LVL3 (GOWN DISPOSABLE) ×4
HOLDER FOLEY CATH W/STRAP (MISCELLANEOUS) ×3 IMPLANT
IRRIG SUCT STRYKERFLOW 2 WTIP (MISCELLANEOUS) ×3
IRRIGATION SUCT STRKRFLW 2 WTP (MISCELLANEOUS) ×2 IMPLANT
KIT PROCEDURE DA VINCI SI (MISCELLANEOUS)
KIT PROCEDURE DVNC SI (MISCELLANEOUS) IMPLANT
KIT TURNOVER KIT A (KITS) IMPLANT
MANIPULATOR UTERINE 4.5 ZUMI (MISCELLANEOUS) ×3 IMPLANT
NEEDLE HYPO 21X1.5 SAFETY (NEEDLE) ×3 IMPLANT
NEEDLE SPNL 18GX3.5 QUINCKE PK (NEEDLE) IMPLANT
OBTURATOR OPTICAL STANDARD 8MM (TROCAR) ×1
OBTURATOR OPTICAL STND 8 DVNC (TROCAR) ×2
OBTURATOR OPTICALSTD 8 DVNC (TROCAR) ×2 IMPLANT
PACK ROBOT GYN WLCUSTOM (TRAY / TRAY PROCEDURE) ×3 IMPLANT
PAD POSITIONING PINK XL (MISCELLANEOUS) ×3 IMPLANT
PENCIL SMOKE EVACUATOR (MISCELLANEOUS) IMPLANT
PORT ACCESS TROCAR AIRSEAL 12 (TROCAR) ×2 IMPLANT
PORT ACCESS TROCAR AIRSEAL 5M (TROCAR) ×1
POUCH SPECIMEN RETRIEVAL 10MM (ENDOMECHANICALS) IMPLANT
SCRUB CHG 4% DYNA-HEX 4OZ (MISCELLANEOUS) ×2 IMPLANT
SEAL CANN UNIV 5-8 DVNC XI (MISCELLANEOUS) ×6 IMPLANT
SEAL XI 5MM-8MM UNIVERSAL (MISCELLANEOUS) ×3
SET TRI-LUMEN FLTR TB AIRSEAL (TUBING) ×3 IMPLANT
SPONGE LAP 18X18 RF (DISPOSABLE) IMPLANT
SURGIFLO W/THROMBIN 8M KIT (HEMOSTASIS) IMPLANT
SUT MNCRL AB 4-0 PS2 18 (SUTURE) IMPLANT
SUT PDS AB 1 TP1 96 (SUTURE) IMPLANT
SUT VIC AB 0 CT1 27 (SUTURE)
SUT VIC AB 0 CT1 27XBRD ANTBC (SUTURE) IMPLANT
SUT VIC AB 2-0 CT1 27 (SUTURE)
SUT VIC AB 2-0 CT1 TAPERPNT 27 (SUTURE) IMPLANT
SUT VICRYL 4-0 PS2 18IN ABS (SUTURE) ×6 IMPLANT
SYR 10ML LL (SYRINGE) IMPLANT
TOWEL OR NON WOVEN STRL DISP B (DISPOSABLE) ×3 IMPLANT
TRAP SPECIMEN MUCUS 40CC (MISCELLANEOUS) IMPLANT
TRAY FOLEY MTR SLVR 16FR STAT (SET/KITS/TRAYS/PACK) ×3 IMPLANT
TROCAR XCEL NON-BLD 5MMX100MML (ENDOMECHANICALS) IMPLANT
UNDERPAD 30X36 HEAVY ABSORB (UNDERPADS AND DIAPERS) ×3 IMPLANT
WATER STERILE IRR 1000ML POUR (IV SOLUTION) ×3 IMPLANT
YANKAUER SUCT BULB TIP 10FT TU (MISCELLANEOUS) IMPLANT

## 2020-03-30 NOTE — Anesthesia Postprocedure Evaluation (Signed)
Anesthesia Post Note  Patient: Anna Henson  Procedure(s) Performed: XI ROBOTIC ASSISTED SALPINGO OOPHORECTOMY (Bilateral ) EXCISION MASS RIGHT LABIA MINORA (N/A )     Patient location during evaluation: Phase II Anesthesia Type: General Level of consciousness: awake and alert, oriented and patient cooperative Pain management: pain level controlled (patient states pain is much improved) Vital Signs Assessment: post-procedure vital signs reviewed and stable Respiratory status: spontaneous breathing, nonlabored ventilation and respiratory function stable Cardiovascular status: blood pressure returned to baseline and stable Postop Assessment: no apparent nausea or vomiting, able to ambulate and adequate PO intake Anesthetic complications: no   No complications documented.  Last Vitals:  Vitals:   03/30/20 1020 03/30/20 1030  BP: (!) 170/80 (!) 162/82  Pulse: 71 72  Resp:    Temp:    SpO2: 94% 94%    Last Pain:  Vitals:   03/30/20 1020  TempSrc:   PainSc: 6                  Alleyah Twombly,E. Feige Lowdermilk

## 2020-03-30 NOTE — Anesthesia Procedure Notes (Signed)
Procedure Name: Intubation Date/Time: 03/30/2020 7:34 AM Performed by: Talbot Grumbling, CRNA Pre-anesthesia Checklist: Patient identified, Emergency Drugs available, Suction available and Patient being monitored Patient Re-evaluated:Patient Re-evaluated prior to induction Oxygen Delivery Method: Circle system utilized Preoxygenation: Pre-oxygenation with 100% oxygen Induction Type: IV induction Ventilation: Mask ventilation without difficulty Laryngoscope Size: Mac and 3 Grade View: Grade I Tube type: Oral Tube size: 7.5 mm Number of attempts: 1 Airway Equipment and Method: Stylet Placement Confirmation: ETT inserted through vocal cords under direct vision,  positive ETCO2 and breath sounds checked- equal and bilateral Secured at: 22 cm Tube secured with: Tape Dental Injury: Teeth and Oropharynx as per pre-operative assessment

## 2020-03-30 NOTE — Discharge Instructions (Signed)
03/30/2020  Return to work: 4-6 weeks  Activity: 1. Be up and out of the bed during the day.  Take a nap if needed.  You may walk up steps but be careful and use the hand rail.  Stair climbing will tire you more than you think, you may need to stop part way and rest.   2. No lifting or straining for 6 weeks.  3. No driving for 1-2 weeks.  Do Not drive if you are taking narcotic pain medicine and until you can brake safely.  4. Shower daily.  Use soap and water on your incision and pat dry; don't rub.   5. No sexual activity and nothing in the vagina for 4 weeks.  Medications:  - Take ibuprofen and tylenol first line for pain control. Take these regularly (every 6 hours) to decrease the build up of pain.  - If necessary, for severe pain not relieved by ibuprofen, take oxycodone.  - While taking percocet you should take sennakot every night to reduce the likelihood of constipation. If this causes diarrhea, stop its use.  Diet: 1. Low sodium Heart Healthy Diet is recommended.  2. It is safe to use a laxative if you have difficulty moving your bowels.   Wound Care: 1. Keep clean and dry.  Shower daily.  Reasons to call the Doctor:   Fever - Oral temperature greater than 100.4 degrees Fahrenheit  Foul-smelling vaginal discharge  Difficulty urinating  Nausea and vomiting  Increased pain at the site of the incision that is unrelieved with pain medicine.  Difficulty breathing with or without chest pain  New calf pain especially if only on one side  Sudden, continuing increased vaginal bleeding with or without clots.   Follow-up: 1. See Anna Henson in 3 weeks.  Contacts: For questions or concerns you should contact:  Dr. Jeral Henson at [phone] After hours and on week-ends call [phone] and ask to speak to the physician on call for Gynecologic Oncology   Bilateral Salpingo-Oophorectomy, Care After This sheet gives you information about how to care  for yourself after your procedure. Your health care provider may also give you more specific instructions. If you have problems or questions, contact your health care provider. What can I expect after the procedure? After the procedure, it is common to have:  Abdominal pain.  Some occasional vaginal bleeding (spotting).  Tiredness.  Symptoms of menopause, such as hot flashes, night sweats, or mood swings. Follow these instructions at home: Incision care   Keep your incision area and your bandage (dressing) clean and dry.  Follow instructions from your health care provider about how to take care of your incision. Make sure you: ? Wash your hands with soap and water before you change your dressing. If soap and water are not available, use hand sanitizer. ? Change your dressing as told by your health care provider. ? Leave stitches (sutures), staples, skin glue, or adhesive strips in place. These skin closures may need to stay in place for 2 weeks or longer. If adhesive strip edges start to loosen and curl up, you may trim the loose edges. Do not remove adhesive strips completely unless your health care provider tells you to do that.  Check your incision area every day for signs of infection. Check for: ? Redness, swelling, or pain. ? Fluid or blood. ? Warmth. ? Pus or a bad smell. Activity   Do not drive or use heavy machinery while taking prescription pain medicine.  Do not drive for 24 hours if you received a medicine to help you relax (sedative) during your procedure.  Take frequent, short walks throughout the day. Rest when you get tired. Ask your health care provider what activities are safe for you.  Avoid activity that requires great effort. Also, avoid heavy lifting. Do not lift anything that is heavier than 10 lbs. (4.5 kg), or the limit that your health care provider tells you, until he or she says that it is safe to do so.  Do not douche, use tampons, or have sex until  your health care provider approves. General instructions   To prevent or treat constipation while you are taking prescription pain medicine, your health care provider may recommend that you: ? Drink enough fluid to keep your urine clear or pale yellow. ? Take over-the-counter or prescription medicines. ? Eat foods that are high in fiber, such as fresh fruits and vegetables, whole grains, and beans. ? Limit foods that are high in fat and processed sugars, such as fried and sweet foods.  Take over-the-counter and prescription medicines only as told by your health care provider.  Do not take baths, swim, or use a hot tub until your health care provider approves. Ask your health care provider if you can take showers. You may only be allowed to take sponge baths for bathing.  Wear compression stockings as told by your health care provider. These stockings help to prevent blood clots and reduce swelling in your legs.  Keep all follow-up visits as told by your health care provider. This is important. Contact a health care provider if:  You have pain when you urinate.  You have pus or a bad smelling discharge coming from your vagina.  You have redness, swelling, or pain around your incision.  You have fluid or blood coming from your incision.  Your incision feels warm to the touch.  You have pus or a bad smell coming from your incision.  You have a fever.  Your incision starts to break open.  You have pain in the abdomen, and it gets worse or does not get better when you take medicine.  You develop a rash.  You develop nausea and vomiting.  You feel lightheaded. Get help right away if:  You develop pain in your chest or leg.  You become short of breath.  You faint.  You have increased bleeding from your vagina. Summary  After the procedure, it is common to have pain, bleeding in the vagina, and symptoms of menopause.  Follow instructions from your health care provider  about how to take care of your incision.  Follow instructions from your health care provider about activities and restrictions.  Check your incision every day for signs of infection and report any symptoms to your health care provider. This information is not intended to replace advice given to you by your health care provider. Make sure you discuss any questions you have with your health care provider. Document Revised: 11/29/2018 Document Reviewed: 10/30/2016 Elsevier Patient Education  2020 [employer].

## 2020-03-30 NOTE — Op Note (Signed)
OPERATIVE NOTE  Pre-operative Diagnosis: ER+ breast cancer, labial cyst  Post-operative Diagnosis: same as above   Operation: Robotic-assisted bilateral salpingoophorectomy, right labial cyst excision  Surgeon: Jeral Pinch MD  Assistant Surgeon: Lahoma Crocker MD (an MD assistant was necessary for tissue manipulation, management of robotic instrumentation, retraction and positioning due to the complexity of the case and hospital policies).   Anesthesia: GET  Urine Output: 400cc  Operative Findings: On EUA, small (<1cm) right labial cyst. Moderately mobile 8-10cm uterus, no adnexal masses appreciated. On intra-abdominal entry, normal appearing upper abdomen. Omentum adherent to the anterior abdominal wall from the level of the umbilicus superiorly from prior hernia surgery. Normal appearing small and large bowel. Bilateral ovaries and tubes normal in appearance, left with simple 2cm cyst. Uterus 8cm and normal appearing. Some filmy adhesions along the pelvic peritoneum, between the bladder peritoneum and uterus/round ligament. No intra-abdominal or pelvic evidence of disease.   Estimated Blood Loss:  less than 50 mL      Total IV Fluids: see I&O flowsheet         Specimens: bilateral tubes and ovaries, right labial cyst         Complications:  None apparent; patient tolerated the procedure well.         Disposition: PACU - hemodynamically stable.  Procedure Details  The patient was seen in the Holding Room. The risks, benefits, complications, treatment options, and expected outcomes were discussed with the patient.  The patient concurred with the proposed plan, giving informed consent.  The site of surgery properly noted/marked. The patient was identified as Anna Henson and the procedure verified as a Robotic-assisted bilateral salpingo oophorectomy, labial cyst excision.   After induction of anesthesia, the patient was draped and prepped in the usual sterile manner. Patient  was placed in supine position after anesthesia and draped and prepped in the usual sterile manner as follows: Her arms were tucked to her side with all appropriate precautions.  The shoulders were stabilized with padded shoulder blocks applied to the acromium processes.  The patient was placed in the semi-lithotomy position in [place].  The perineum and vagina were prepped with CholoraPrep. The patient was draped after the CholoraPrep had been allowed to dry for 3 minutes.  A Time Out was held and the above information confirmed.  The urethra was prepped with Betadine. Foley catheter was placed.  A sterile speculum was placed in the vagina.  The cervix was grasped with a single-tooth tenaculum. The cervix was dilated with Kennon Rounds dilators.  The ZUMI uterine manipulator with a medium colpotomizer ring was placed without difficulty.  A pneum occluder balloon was placed over the manipulator.  OG tube placement was confirmed and to suction.   Next, a 10 mm skin incision was made 1 cm below the subcostal margin in the midclavicular line.  The 5 mm Optiview port and scope was used for direct entry.  Opening pressure was under 10 mm CO2.  The abdomen was insufflated and the findings were noted as above.   At this point and all points during the procedure, the patient's intra-abdominal pressure did not exceed 15 mmHg. Next, an 8 mm skin incision was made to the right of the umbilicus in the mid-abdomen, and two on the left, all avoiding the mesh.  The 5 mm assist trocar was exchanged for a 10-12 mm port. All ports were placed under direct visualization.  The patient was placed in steep Trendelenburg.  The robot was docked in  the normal manner.  The right and left peritoneum were opened parallel to the IP ligament to open the retroperitoneal spaces bilaterally. The ureter was noted to be on the medial leaf of the broad ligament.  The peritoneum above the ureter was incised and stretched and the infundibulopelvic  ligament was skeletonized, cauterized and cut.  The medial leaf of the broad ligament was then cauterized to just lateral to the uterus. Bilateral fallopian tubes and utero-ovarian ligaments were isolated, cauterized, and transected just lateral to the uterus, freeing the adnexa. Pedicles were inspected and excellent hemostasis was achieved.    Irrigation was used and excellent hemostasis was achieved. Both adnexa were placed in an endocatch bag and pulled through the assist trocar. At this point in the procedure was completed.  Robotic instruments were removed under direct visulaization.  The robot was undocked. The endocatch bag was opened and the specimen removed with ringed forceps in piece meal fashion until the remaining specimen and bag were removed. The specimen was handed off the field and sent to pathology. The fascia at the 10-12 mm port was closed with 0 Vicryl on a UR-5 needle.  The subcuticular tissue was closed with 4-0 Vicryl and the skin was closed with 4-0 Monocryl in a subcuticular manner.  Dermabond was applied.    The vagina was swabbed with  minimal bleeding noted. Foley catheter was removed. A scalpel was used to incise along the medial aspect of the labia minora on the right. The two small labial cysts, suspected to be sebaceous cysts, were then shelled out and handed off the field. The incision was irrigated and electrocautery was used to achieve hemostasis. Several interrupted stitches with 3-0 Vicryl were then used to approximate the incision.  All sponge, lap and needle counts were correct x  3.   The patient was transferred to the recovery room in stable condition.  Jeral Pinch, MD

## 2020-03-30 NOTE — H&P (Signed)
GYNECOLOGIC ONCOLOGY HISTORY AND PHYSICAL   Patient Name: Anna Henson  Patient Age: 49 y.o.  History of Present Illness:  Anna Henson is a 49 y.o. y.o. female who presents for RR-BSO in the setting of ER positive breast cancer, labial mass excision. Overall doing well without major changes since her last visit with me.  PAST MEDICAL HISTORY:  Past Medical History:  Diagnosis Date  . Anemia   . Anxiety   . Bipolar disorder in full remission ([place])   . CAD in [place] artery cardiologist--  dr hilty   a. 09/2014 Cath/PCI in setting of [place] - s/p 2.25 x 12 mm Promus Premier DES to mid RCA;  b. 11/2016 Myoview: EF 56%, no ischemia;  c. 12/2016 NSTEMI/PCI: LM nl, LAd 25p, LCX 30p, RCA 100p (2.75x38 Promus Premier DES), mRCA 10 ISR, EF 50-55%.  . CKD (chronic kidney disease), stage III   . Depression   . Genetic testing 04/23/2017   Ms. Mendolia underwent genetic counseling and testing for hereditary cancer syndromes on 04/05/2017. Her results were negative for mutations in all 46 genes analyzed by [facility]'s 46-gene Common Hereditary Cancers Panel. Genes analyzed include: APC, ATM, AXIN2, BARD1, BMPR1A, BRCA1, BRCA2, BRIP1, CDH1, CDKN2A, CHEK2, CTNNA1, DICER1, EPCAM, GREM1, HOXB13, KIT, MEN1, MLH1, MSH2, MSH3, MSH6, MUTYH, NBN,  . GERD (gastroesophageal reflux disease)   . Headache   . History of external beam radiation therapy    right breast 07-27-2017  to 09-25-2017  . History of non-ST elevation myocardial infarction (NSTEMI)   . HOCM (hypertrophic obstructive cardiomyopathy) ([place]) followed by cardiology   a. 12/2016 Echo: EF 65-70%,  mod conc LVH, dynamic obstruction @ rest, peak velocity of 291 cm/sec w/ peak gradient of 36mHg, no rwma, Gr1 DD, triv TR, PASP 180mg.  . Marland Kitchenyperlipidemia   . Hypertension   . Malignant neoplasm of lower-outer quadrant of right breast of female, estrogen receptor [facility] dr guLindi Adie dx 06/ 2018--- right breast invasive lobular carcinoma,  ductal carcinoma w/ LCIS---- 06-21-2017 s/p right breast lumpecotmy w/ sln disseciton;   completed radiation 09-25-2017;  on tamoxifen  . [place]ocardial infarction ([place]   2017  . S/P drug eluting coronary stent placement    09-14-2014---  [facility]  PCI with DES x1 to proxRCA  . [facility] dr saFelecia Shelling 01/ 2017  dx transverse myelitis w/ right side numbness (09-01-2019  currently lower extremity weakness, mucsle spasms, and gait disturbance)  . Wears glasses      PAST SURGICAL HISTORY:  Past Surgical History:  Procedure Laterality Date  . BREAST LUMPECTOMY WITH RADIOACTIVE SEED AND SENTINEL LYMPH NODE BIOPSY Right 06/21/2017   Procedure: RIGHT BREAST BRACKETED SEED GUIDED LUMPECTOMY AND SENTINEL LYMPH NODE BIOPSY;  Surgeon: ToJovita KussmaulMD;  [facility]: General;  Laterality: Right;  . CORONARY/GRAFT ACUTE MI REVASCULARIZATION N/A 12/31/2016   Procedure: Coronary/Graft Acute MI Revascularization;  Surgeon: MiSherren MochaMD;  [facility];  Service: Cardiovascular;  Laterality: N/A;  . DILATATION & CURETTAGE/HYSTEROSCOPY WITH MYOSURE N/A 09/02/2019   Procedure: DILATATION & CURETTAGE/HYSTEROSCOPY WITH MYOSURE;  Surgeon: FoAloha GellMD;  [place] [facility] Service: Gynecology;  Laterality: N/A;  . HERNIA REPAIR    . LEFT HEART CATH AND CORONARY ANGIOGRAPHY N/A 12/31/2016   Procedure: Left Heart Cath and Coronary Angiography;  Surgeon: MiSherren MochaMD;  [place];  Service: Cardiovascular;  Laterality: N/A;  . LEFT HEART CATHETERIZATION WITH CORONARY ANGIOGRAM N/A 09/14/2014   Procedure: LEFT HEART CATHETERIZATION WITH CORONARY ANGIOGRAM;  Surgeon: Burnell Blanks, MD;  Location[facility] LAB;  Service: Cardiovascular;  Laterality: N/A;  . PERCUTANEOUS CORONARY STENT INTERVENTION (PCI-S)  09/14/2014   Procedure: PERCUTANEOUS CORONARY STENT INTERVENTION  (PCI-S);  Surgeon: Burnell Blanks, MD;  Location: [facility] CATH LAB;  Service: Cardiovascular;;  . TONSILLECTOMY  2005  . UMBILICAL HERNIA REPAIR  2001; 2005   MEDICATIONS: No current facility-administered medications on file prior to encounter.   Current Outpatient Medications on File Prior to Encounter  Medication Sig Dispense Refill  . acetaminophen (TYLENOL) 325 MG tablet Take 325-650 mg by mouth every 8 (eight) hours as needed (for pain or cramping).     Marland Kitchen amLODipine (NORVASC) 10 MG tablet TAKE 1 TABLET(10 MG) BY MOUTH DAILY (Patient taking differently: Take 10 mg by mouth daily. ) 90 tablet 2  . aspirin 81 MG chewable tablet Chew 1 tablet (81 mg total) by mouth daily.    Marland Kitchen atorvastatin (LIPITOR) 80 MG tablet TAKE 1 TABLET(80 MG) BY MOUTH DAILY (Patient taking differently: Take 80 mg by mouth every evening. ) 90 tablet 1  . ferrous sulfate 325 (65 FE) MG tablet Take 325 mg by mouth daily.    . furosemide (LASIX) 20 MG tablet Take 1 tablet (20 mg total) by mouth daily as needed. (Patient taking differently: Take 20 mg by mouth daily as needed (fluid retention.). ) 90 tablet 0  . gabapentin (NEURONTIN) 300 MG capsule Take '600mg'$  (3 capsules) by mouth three times daily (Patient taking differently: Take 900 mg by mouth in the morning, at noon, and at bedtime. ) 270 capsule 3  . lisinopril (ZESTRIL) 20 MG tablet TAKE 1 TABLET(20 MG) BY MOUTH TWICE DAILY (Patient taking differently: Take 20 mg by mouth in the morning and at bedtime. ) 180 tablet 2  . tamoxifen (NOLVADEX) 20 MG tablet Take 1 tablet (20 mg total) by mouth daily. 90 tablet 3  . traMADol (ULTRAM) 50 MG tablet TAKE 1 TABLET(50 MG) BY MOUTH TWICE DAILY AS NEEDED (Patient taking differently: Take 50 mg by mouth in the morning and at bedtime. ) 60 tablet 5  . fluticasone (FLONASE) 50 MCG/ACT nasal spray Place 1-2 sprays into both nostrils daily as needed for allergies or rhinitis.    Marland Kitchen nitroGLYCERIN (NITROSTAT) 0.4 MG SL tablet PLACE 1  TABLET UNDER THE TONGUE EVERY 5 MINUTES AS NEEDED FOR CHEST PAIN (Patient taking differently: Place 0.4 mg under the tongue every 5 (five) minutes x 3 doses as needed for chest pain. ) 25 tablet 4   ALLERGIES:  Allergies  Allergen Reactions  . Pork-Derived Products Other (See Comments)    Does NOT eat pork     FAMILY HISTORY:  Family History  Problem Relation Age of Onset  . Diabetes Mother   . Heart disease Mother   . Hyperlipidemia Mother   . Hypertension Mother   . Breast cancer Mother   . Lung cancer Father   . Drug abuse Father   . Brain cancer Father 19  . Bone cancer Father 19  . Drug abuse Sister   . Mental illness Brother   . Mental illness Maternal Grandmother   . Stomach cancer Paternal Grandmother 75       d.75     SOCIAL HISTORY:    Social Connections:   . Frequency of Communication with Friends and Family:   . Frequency  of Social Gatherings with Friends and Family:   . Attends Religious Services:   . Active Member of Clubs or Organizations:   . Attends Archivist Meetings:   Marland Kitchen Marital Status:     REVIEW OF SYSTEMS:  Denies appetite changes, fevers, chills, fatigue, unexplained weight changes. Denies hearing loss, neck lumps or masses, mouth sores, ringing in ears or voice changes. Denies cough or wheezing.  Denies shortness of breath. Denies chest pain or palpitations. Denies leg swelling. Denies abdominal distention, pain, blood in stools, constipation, diarrhea, nausea, vomiting, or early satiety. Denies pain with intercourse, dysuria, frequency, hematuria or incontinence. Denies hot flashes, pelvic pain, vaginal bleeding or vaginal discharge.   Denies joint pain, back pain or muscle pain/cramps. Denies itching, rash, or wounds. Denies dizziness, headaches, numbness or seizures. Denies swollen lymph nodes or glands, denies easy bruising or bleeding. Denies anxiety, depression, confusion, or decreased concentration.  Physical Exam:  Vital  Signs for this encounter:  Blood pressure 140/82, pulse 68, temperature 98.6 F (37 C), temperature source Oral, resp. rate 17, height 5' 1.5" (1.562 m), weight 143 lb 2 oz (64.9 kg), last menstrual period 03/06/2020, SpO2 100 %. Body mass index is 26.61 kg/m. General: Alert, oriented, no acute distress. HEENT: Posterior oropharynx clear, sclera anicteric. Chest: Unlabored breathing on room air. Cardiovascular: Regular rate and rhythm, no murmurs. Exam on 4/27: Abdomen: soft, nontender.  Normoactive bowel sounds.  No masses or hepatosplenomegaly appreciated.  Well-healed supraumbilical incision from 2 prior hernia repairs, with mesh. Extremities: Grossly normal range of motion.  Warm, well perfused.  No edema bilaterally. Skin: No rashes or lesions noted. GU: Normal appearing external genitalia without erythema, excoriation.  Small, less than 1 cm cyst on the right labia minora, likely inclusion cyst or lipoma, nontender.  Speculum exam reveals normal-appearing cervix, well rugated vaginal mucosa.  Bimanual exam reveals anteverted small, mobile uterus, no adnexal masses appreciated.    LABORATORY AND RADIOLOGIC DATA:   Lab Results  Component Value Date   WBC 8.4 03/23/2020   HGB 11.5 (L) 03/23/2020   HCT 34.8 (L) 03/23/2020   PLT 304 03/23/2020   GLUCOSE 103 (H) 03/23/2020   CHOL 149 07/09/2018   TRIG 99 07/09/2018   HDL 42 07/09/2018   LDLCALC 87 07/09/2018   ALT 15 03/23/2020   AST 21 03/23/2020   NA 138 03/23/2020   K 3.6 03/23/2020   CL 105 03/23/2020   CREATININE 0.80 03/23/2020   BUN 12 03/23/2020   CO2 24 03/23/2020   TSH 1.648 12/31/2016   INR 1.03 11/12/2015   HGBA1C 5.8 02/22/2016   MICROALBUR 0.8 09/07/2014   Assessment/Plan:  49yo with ER+ breast cancer presenting for robotic risk-reducing BSO, vulvar mass excision. Plan for outpatient procedure today.   Jeral Pinch, MD  Division of Gynecologic Oncology

## 2020-03-30 NOTE — Transfer of Care (Signed)
Immediate Anesthesia Transfer of Care Note  Patient: Anna Henson  Procedure(s) Performed: XI ROBOTIC ASSISTED SALPINGO OOPHORECTOMY (Bilateral ) EXCISION MASS RIGHT LABIA MINORA (N/A )  Patient Location: PACU  Anesthesia Type:General  Level of Consciousness: sedated  Airway & Oxygen Therapy: Patient Spontanous Breathing and Patient connected to face mask oxygen  Post-op Assessment: Report given to RN and Post -op Vital signs reviewed and stable  Post vital signs: Reviewed and stable  Last Vitals:  Vitals Value Taken Time  BP 166/89 03/30/20 0908  Temp    Pulse 73 03/30/20 0910  Resp 20 03/30/20 0910  SpO2 92 % 03/30/20 0910  Vitals shown include unvalidated device data.  Last Pain:  Vitals:   03/30/20 0536  TempSrc:   PainSc: 4       Patients Stated Pain Goal: 4 (95/07/22 5750)  Complications: No complications documented.

## 2020-03-31 ENCOUNTER — Encounter (HOSPITAL_COMMUNITY): Payer: Self-pay | Admitting: Gynecologic Oncology

## 2020-03-31 ENCOUNTER — Telehealth: Payer: Self-pay

## 2020-03-31 NOTE — Telephone Encounter (Signed)
Anna Henson  states that she is eating, drinking, and urinating well. She is passing gas. Told her begin the senokot-s today and take bid.  If she has no BM by mid tomorrow, she needs to add Miralax BID. Afebrile. Incisions are D&I. Pain controlled with current medications. She is aware of her post-op appointment on 04-22-20 at 130 and the office number [phone].

## 2020-04-01 LAB — SURGICAL PATHOLOGY

## 2020-04-02 ENCOUNTER — Other Ambulatory Visit: Payer: Self-pay | Admitting: Diagnostic Neuroimaging

## 2020-04-05 ENCOUNTER — Telehealth: Payer: Self-pay | Admitting: *Deleted

## 2020-04-05 ENCOUNTER — Telehealth: Payer: Self-pay | Admitting: Hematology and Oncology

## 2020-04-05 NOTE — Telephone Encounter (Signed)
Told Ms Yale that Anna John, NP stated that with the surgery that she probably did start a menstrual cycle. She also can have some bleeding from the manipulator used on her cervix during the procedure. Ms Alonge states that the bleeding has decreased. She wanted to know when she could resume the Tamoxifen. Told her that Dr. Lindi Adie stated that she can resume the Tamoxifen 1 week after surgery which will be Wednesday 04-07-20. A message was sent to his schedulers to schedule a follow up appointment with in the next 2 weeks to discuss possibly  changing her anti-estrogen therapy. Pt verbalized understanding.

## 2020-04-05 NOTE — Telephone Encounter (Signed)
Patient called and stated "I have called this past weekend to the after hours number. I started having some bleeding Wednesday night into to Thursday morning. It started off has spotting and then changed to bleeding by Thursday night. I was soaking through pads every 2 hours. It has since slowed down. I did have some N/V yesterday. My pain comes and goes; but I have been able to manage it with the rotating of the ibuprofen and OXY. I didn't know if it was because I was close to starting menstrual cycle." Explained that the message would be given to [facility] APP and someone would call her back

## 2020-04-05 NOTE — Telephone Encounter (Signed)
Scheduled per 6/28 sch message. Pt is aware of appt

## 2020-04-15 ENCOUNTER — Ambulatory Visit: Payer: 59 | Admitting: Hematology and Oncology

## 2020-04-15 ENCOUNTER — Other Ambulatory Visit: Payer: Self-pay | Admitting: Internal Medicine

## 2020-04-15 DIAGNOSIS — R079 Chest pain, unspecified: Secondary | ICD-10-CM

## 2020-04-19 ENCOUNTER — Telehealth: Payer: Self-pay | Admitting: *Deleted

## 2020-04-19 NOTE — Telephone Encounter (Signed)
Called and moved her appt from 7/15 to 7/16

## 2020-04-19 NOTE — Telephone Encounter (Signed)
Rx(s) sent to pharmacy electronically.  

## 2020-04-22 ENCOUNTER — Ambulatory Visit: Payer: 59 | Admitting: Gynecologic Oncology

## 2020-04-22 NOTE — Progress Notes (Signed)
Gynecologic Oncology Return Clinic Visit  04/22/20  Reason for Visit: post-op after therapeutic BSO in the setting of ER+ breast cancer  Treatment History: Oncology History  Malignant neoplasm of lower-outer quadrant of right breast of female, estrogen receptor positive ([place])  03/26/2017 Initial Diagnosis   Palpable right breast mass with a normal mammogram: 8:00 position by ultrasound 3.2 cm irregular mass: Grade 2 invasive lobular cancer ER 85%, PR 100%, Ki-67 2%, HER-2 negative ratio 1.5, T2 N0 stage II a clinical stage   04/20/2017 Genetic Testing   Genetic counseling and testing for hereditary cancer syndromes performed on . Results are negative for pathogenic mutations in 46 genes analyzed by [employer]'s Common Hereditary Cancers Panel. Results are dated . Genes tested: APC, ATM, AXIN2, BARD1, BMPR1A, BRCA1, BRCA2, BRIP1, CDH1, CDKN2A, CHEK2, CTNNA1, DICER1, EPCAM, GREM1, HOXB13, KIT, MEN1, MLH1, MSH2, MSH3, MSH6, MUTYH, NBN, NF1, NTHL1, PALB2, PDGFRA, PMS2, POLD1, POLE, PTEN, RAD50, RAD51C, RAD51D, SDHA, SDHB, SDHC, SDHD, SMAD4, SMARCA4, STK11, TP53, TSC1, TSC2, and VHL.  Variants of uncertain significance (not clinically actionable) were noted in APC and BRIP1.      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  She had bleeding that was heavy like a menses starting about 2 days after surgery.  She denies any further bleeding or discharge since then.  She notes regular bowel function although has some internal pain both with gas as well as prior to having a bowel  movement.  She denies any fevers or chills.   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  Starting shortly after surgery, she noticed that she was having headaches that radiated across her forehead, down behind her ears and to her neck.  Since surgery she also notes having random episodes of developing goosebumps suddenly that last for about 5-10 minutes.  She also has had intermittent episodes of sweating (describes these as happening a couple of times a day).  Past Medical/Surgical History: Past Medical History:  Diagnosis Date  . Anemia   . Anxiety   . Bipolar disorder in full remission ([place])   . CAD in native artery cardiologist--  dr hilty   a. 09/2014 Cath/PCI in setting of [place] - s/p 2.25 x 12 mm Promus Premier DES to mid RCA;  b. 11/2016 Myoview: EF 56%, no ischemia;  c. 12/2016 NSTEMI/PCI: LM nl, LAd 25p, LCX 30p, RCA 100p (2.75x38 Promus Premier DES), mRCA 10 ISR, EF 50-55%.  . CKD (chronic kidney disease), stage III   . Depression   . Genetic testing 04/23/2017   Ms. Goeller underwent genetic counseling and testing for hereditary cancer syndromes on 04/05/2017. Her results were negative for mutations in all 46 genes analyzed by [facility]'s 46-gene Common Hereditary Cancers Panel. Genes analyzed include: APC, ATM, AXIN2, BARD1, BMPR1A, BRCA1, BRCA2, BRIP1, CDH1, CDKN2A, CHEK2, CTNNA1, DICER1, EPCAM, GREM1, HOXB13, KIT, MEN1, MLH1, MSH2, MSH3, MSH6, MUTYH, NBN,  . GERD (gastroesophageal reflux disease)   . Headache   . History of  external beam radiation therapy    right breast 07-27-2017  to 09-25-2017  . History of non-ST elevation myocardial infarction (NSTEMI)   . HOCM (hypertrophic obstructive cardiomyopathy) ([place]) followed by cardiology   a. 12/2016 Echo: EF 65-70%,  mod conc LVH, dynamic obstruction @ rest, peak velocity of 291 cm/sec w/ peak gradient of 25mHg, no rwma, Gr1 DD, triv TR, PASP 187mg.  . Marland Kitchenyperlipidemia   . Hypertension   .  Malignant neoplasm of lower-outer quadrant of right breast of female, estrogen [facility] dr gu[facility]indi Adie dx 06/ 2018--- right breast invasive lobular carcinoma, ductal carcinoma w/ LCIS---- 06-21-2017 s/p right breast lumpecotmy w/ sln disseciton;   completed radiation 09-25-2017;  on tamoxifen  . Myocardial infarction ([facility]   2017  . S/P drug eluting coronary stent placement    09-14-2014[facility]  PCI with DES x1 to proxRCA  . [facility] dr saFelecia Shelling 01/ 2017  dx transverse myelitis w/ right side numbness (09-01-2019  currently lower extremity weakness, mucsle spasms, and gait disturbance)  . Wears glasses     Past Surgical History:  Procedure Laterality Date  . BREAST LUMPECTOMY WITH RADIOACTIVE SEED AND SENTINEL LYMPH NODE BIOPSY Right 06/21/2017   Procedure: RIGHT BREAST BRACKETED SEED GUIDED LUMPECTOMY AND SENTINEL LYMPH NODE BIOPSY;  Surgeon: ToJovita KussmaulMD;  [place] [facility] Service: General;  Laterality: Right;  . CORONARY/GRAFT ACUTE MI REVASCULARIZATION N/A 12/31/2016   Procedure: Coronary/Graft Acute MI Revascularization;  Surgeon: MiSherren MochaMD;  Location: [facility];  Service: Cardiovascular;  Laterality: N/A;  . DILATATION & CURETTAGE/HYSTEROSCOPY WITH MYOSURE N/A 09/02/2019   Procedure: DILATATION & CURETTAGE/HYSTEROSCOPY WITH MYOSURE;  Surgeon: FoAloha GellMD;  [place] Service: Gynecology;  Laterality: N/A;  . HERNIA REPAIR    . LEFT HEART CATH AND CORONARY ANGIOGRAPHY N/A 12/31/2016   Procedure: Left Heart Cath and Coronary Angiography;  Surgeon: MiSherren MochaMD;  Location: [facility];  Service: Cardiovascular;  Laterality: N/A;  . LEFT HEART CATHETERIZATION WITH CORONARY ANGIOGRAM N/A 09/14/2014   Procedure: LEFT HEART CATHETERIZATION WITH CORONARY ANGIOGRAM;  Surgeon: ChBurnell BlanksMD;  [facility];  Service:  Cardiovascular;  Laterality: N/A;  . MASS EXCISION N/A 03/30/2020   Procedure: EXCISION MASS RIGHT LABIA MINORA;  Surgeon: TuLafonda MossesMD;  Location: [facility] ORS;  Service: Gynecology;  Laterality: N/A;  . PERCUTANEOUS CORONARY STENT INTERVENTION (PCI-S)  09/14/2014   Procedure: PERCUTANEOUS CORONARY STENT INTERVENTION (PCI-S);  Surgeon: ChBurnell BlanksMD;  [facility];  Service: Cardiovascular;;  . ROBOTIC ASSISTED SALPINGO OOPHERECTOMY Bilateral 03/30/2020   Procedure: XI ROBOTIC ASSISTED SALPINGO OOPHORECTOMY;  Surgeon: TuLafonda MossesMD;  Location: [facility];  Service: Gynecology;  Laterality: Bilateral;  . TONSILLECTOMY  2005  . UMBILICAL HERNIA REPAIR  2001; 2005    Family History  Problem Relation Age of Onset  . Diabetes Mother   . Heart disease Mother   . Hyperlipidemia Mother   . Hypertension Mother   . Breast cancer Mother   . Lung cancer Father   . Drug abuse Father   . Brain cancer Father [id number]. Bone cancer Father [id number]. Drug abuse Sister   . Mental illness Brother   . Mental illness Maternal Grandmother   . Stomach cancer Paternal Grandmother [id number]     d.75  Social History   Socioeconomic History  . Marital status: Single    Spouse name: Not on file  . Number of children: 0  . Years of education: masters  . Highest education level: Not on file  Occupational History  . Occupation: Therapist, art at a call center    Employer: [employer]  Tobacco Use  . Smoking status: Former Smoker    Packs/day: 0.50    Years: 30.00    Pack years: 15.00    Types: Cigarettes    Quit date: 10/05/2015    Years since quitting: 4.5  . Smokeless tobacco: Never Used  Vaping Use  . Vaping Use: Former  Substance and Sexual Activity  . Alcohol use: Yes    Alcohol/week: 2.0 standard drinks    Types: 2 Cans of beer per week    Comment: occas.  . Drug use: No  . Sexual activity: Not Currently  Other Topics Concern  . Not on file  Social History Narrative    Consumes 2 cups of caffeine daily   Social Determinants of Health   Financial Resource Strain:   . Difficulty of Paying Living Expenses:   Food Insecurity:   . Worried About Charity fundraiser in the Last Year:   . Arboriculturist in the Last Year:   Transportation Needs:   . Film/video editor (Medical):   Marland Kitchen Lack of Transportation (Non-Medical):   Physical Activity:   . Days of Exercise per Week:   . Minutes of Exercise per Session:   Stress:   . Feeling of Stress :   Social Connections:   . Frequency of Communication with Friends and Family:   . Frequency of Social Gatherings with Friends and Family:   . Attends Religious Services:   . Active Member of Clubs or Organizations:   . Attends Archivist Meetings:   Marland Kitchen Marital Status:     Current Medications:  Current Outpatient Medications:  .  acetaminophen (TYLENOL) 325 MG tablet, Take 325-650 mg by mouth every 8 (eight) hours as needed (for pain or cramping). , Disp: , Rfl:  .  amLODipine (NORVASC) 10 MG tablet, TAKE 1 TABLET(10 MG) BY MOUTH DAILY (Patient taking differently: Take 10 mg by mouth daily. ), Disp: 90 tablet, Rfl: 2 .  aspirin 81 MG chewable tablet, Chew 1 tablet (81 mg total) by mouth daily., Disp: , Rfl:  .  atorvastatin (LIPITOR) 80 MG tablet, TAKE 1 TABLET(80 MG) BY MOUTH DAILY (Patient taking differently: Take 80 mg by mouth every evening. ), Disp: 90 tablet, Rfl: 1 .  baclofen (LIORESAL) 10 MG tablet, TAKE BY MOUTH UP TO 4 TABLETS EVERY DAY FOR MUSCLE SPASMS, Disp: 120 tablet, Rfl: 5 .  celecoxib (CELEBREX) 200 MG capsule, TAKE 1 CAPSULE(200 MG) BY MOUTH TWICE DAILY, Disp: 30 capsule, Rfl: 3 .  ferrous sulfate 325 (65 FE) MG tablet, Take 325 mg by mouth daily., Disp: , Rfl:  .  fluticasone (FLONASE) 50 MCG/ACT nasal spray, Place 1-2 sprays into both nostrils daily as needed for allergies or rhinitis., Disp: , Rfl:  .  furosemide (LASIX) 20 MG tablet, Take 1 tablet (20 mg total) by mouth daily  as needed. (Patient taking differently: Take 20 mg by mouth daily as needed (fluid retention.). ), Disp: 90 tablet, Rfl: 0 .  gabapentin (NEURONTIN) 300 MG capsule, Take 666m (3 capsules) by mouth three times daily (Patient taking differently: Take 900 mg by mouth in the morning, at noon, and at  bedtime. ), Disp: 270 capsule, Rfl: 3 .  ibuprofen (ADVIL) 600 MG tablet, Take 1 tablet (600 mg total) by mouth every 6 (six) hours as needed for moderate pain. For AFTER surgery, Disp: 15 tablet, Rfl: 0 .  lisinopril (ZESTRIL) 20 MG tablet, TAKE 1 TABLET(20 MG) BY MOUTH TWICE DAILY (Patient taking differently: Take 20 mg by mouth in the morning and at bedtime. ), Disp: 180 tablet, Rfl: 2 .  metoprolol tartrate (LOPRESSOR) 25 MG tablet, Take 1 tablet (25 mg total) by mouth 2 (two) times daily., Disp: 180 tablet, Rfl: 3 .  nitroGLYCERIN (NITROSTAT) 0.4 MG SL tablet, Place 1 tablet (0.4 mg total) under the tongue every 5 (five) minutes as needed for chest pain., Disp: 25 tablet, Rfl: 4 .  oxyCODONE (OXY IR/ROXICODONE) 5 MG immediate release tablet, Take 1 tablet (5 mg total) by mouth every 4 (four) hours as needed for severe pain. For AFTER surgery pain, do not take and drive, do not take with tramadol, Disp: 10 tablet, Rfl: 0 .  senna-docusate (SENOKOT-S) 8.6-50 MG tablet, Take 2 tablets by mouth at bedtime. For AFTER surgery, do not take if having diarrhea, Disp: 30 tablet, Rfl: 0 .  tamoxifen (NOLVADEX) 20 MG tablet, Take 1 tablet (20 mg total) by mouth daily., Disp: 90 tablet, Rfl: 3 .  traMADol (ULTRAM) 50 MG tablet, TAKE 1 TABLET(50 MG) BY MOUTH TWICE DAILY AS NEEDED, Disp: 60 tablet, Rfl: 5  Review of Systems: Pertinent positives as per HPI. Denies appetite changes, fevers, chills, fatigue, unexplained weight changes. Denies hearing loss, neck lumps or masses, mouth sores, ringing in ears or voice changes. Denies cough or wheezing.  Denies shortness of breath. Denies chest pain or palpitations. Denies  leg swelling. Denies abdominal distention, pain, blood in stools, constipation, diarrhea, nausea, vomiting, or early satiety. Denies pain with intercourse, dysuria, frequency, hematuria or incontinence. Denies hot flashes, pelvic pain, or vaginal discharge.   Denies joint pain, back pain or muscle pain/cramps. Denies itching, rash, or wounds. Denies dizziness, numbness or seizures. Denies swollen lymph nodes or glands, denies easy bruising or bleeding. Denies anxiety, depression, confusion, or decreased concentration.  Physical Exam: BP 132/72 (BP Location: Left Arm, Patient Position: Sitting)   Pulse 66   Temp 98.1 F (36.7 C) (Oral)   Resp 18   Ht 5' 1.5" (1.562 m)   Wt 144 lb (65.3 kg)   SpO2 100%   BMI 26.77 kg/m  General: Alert, oriented, no acute distress. HEENT: Normocephalic, atraumatic, sclera anicteric. Chest: Unlabored breathing on room air. Abdomen: Obese, soft, nontender.  Normoactive bowel sounds.  No masses or hepatosplenomegaly appreciated.  Well-healing incisions. Extremities: Grossly normal range of motion.  Warm, well perfused.  No edema bilaterally.  Laboratory & Radiologic Studies: A. OVARIES AND FALLOPIAN TUBES, BILATERAL, SALPINGO OOPHORECTOMY:  - Bilateral ovaries:    Corpus luteum and benign follicular cysts.    No endometriosis or evidence of malignancy.   Bilateral fallopian tubes:    Benign paratubal cysts.    No endometriosis or evidence of malignancy.   B. CYST, LABIA, EXCISION:  - Benign epidermal cyst.  - No evidence of malignancy.   Assessment & Plan: Anna Henson is a 49 y.o. Henson s/p robotic BSo and labial cyst excision on 6/22 in the setting of ER+ breast cancer.  Patient overall appears to be healing well from surgery.  We discussed benign pathology again and the patient was given a handout of her results.  It is a little bit hard  to tease out what of her symptoms may be related to surgical menopause and what may be related to  her other medical comorbidities.  I recommended that we check back in in 3-4 weeks to discuss any continued symptoms.  If at that point she is continuing to have symptoms, especially sweating episodes, I think it would be reasonable to start nonhormonal therapy to treat menopausal symptoms.  Patient was scheduled for a 4-week phone visit with me.  All of her other questions and concerns were answered today.  20 minutes of total time was spent for this patient encounter, including preparation, face-to-face counseling with the patient and coordination of care, and documentation of the encounter.  Jeral Pinch, MD  Division of Gynecologic Oncology  Department of Obstetrics and Gynecology  [facility]

## 2020-04-23 ENCOUNTER — Encounter: Payer: Self-pay | Admitting: Gynecologic Oncology

## 2020-04-23 ENCOUNTER — Inpatient Hospital Stay: Payer: 59 | Attending: Hematology and Oncology | Admitting: Gynecologic Oncology

## 2020-04-23 ENCOUNTER — Other Ambulatory Visit: Payer: Self-pay

## 2020-04-23 VITALS — BP 132/72 | HR 66 | Temp 98.1°F | Resp 18 | Ht 61.5 in | Wt 144.0 lb

## 2020-04-23 DIAGNOSIS — N83291 Other ovarian cyst, right side: Secondary | ICD-10-CM

## 2020-04-23 DIAGNOSIS — Z87891 Personal history of nicotine dependence: Secondary | ICD-10-CM | POA: Insufficient documentation

## 2020-04-23 DIAGNOSIS — C50511 Malignant neoplasm of lower-outer quadrant of right female breast: Secondary | ICD-10-CM | POA: Insufficient documentation

## 2020-04-23 DIAGNOSIS — Z90722 Acquired absence of ovaries, bilateral: Secondary | ICD-10-CM | POA: Insufficient documentation

## 2020-04-23 DIAGNOSIS — Z170 Estrogen receptor positive status [ER+]: Secondary | ICD-10-CM | POA: Insufficient documentation

## 2020-04-23 DIAGNOSIS — Z955 Presence of coronary angioplasty implant and graft: Secondary | ICD-10-CM | POA: Insufficient documentation

## 2020-04-23 NOTE — Patient Instructions (Signed)
You are healing really well from surgery!  I will talk to you in about 4 weeks by phone.  Some of your symptoms may be related to estrogen withdrawal.  At that point, depending on how your symptoms are, we can discuss nonhormonal medications to try for the symptoms if they persist.

## 2020-04-28 ENCOUNTER — Telehealth: Payer: Self-pay | Admitting: Hematology and Oncology

## 2020-04-28 ENCOUNTER — Other Ambulatory Visit: Payer: Self-pay

## 2020-04-28 ENCOUNTER — Inpatient Hospital Stay: Payer: 59 | Admitting: Hematology and Oncology

## 2020-04-28 DIAGNOSIS — Z170 Estrogen receptor positive status [ER+]: Secondary | ICD-10-CM

## 2020-04-28 DIAGNOSIS — Z955 Presence of coronary angioplasty implant and graft: Secondary | ICD-10-CM | POA: Diagnosis not present

## 2020-04-28 DIAGNOSIS — Z87891 Personal history of nicotine dependence: Secondary | ICD-10-CM | POA: Diagnosis not present

## 2020-04-28 DIAGNOSIS — C50511 Malignant neoplasm of lower-outer quadrant of right female breast: Secondary | ICD-10-CM | POA: Diagnosis not present

## 2020-04-28 DIAGNOSIS — Z90722 Acquired absence of ovaries, bilateral: Secondary | ICD-10-CM | POA: Diagnosis not present

## 2020-04-28 MED ORDER — VENLAFAXINE HCL ER 37.5 MG PO CP24
37.5000 mg | ORAL_CAPSULE | Freq: Every day | ORAL | 3 refills | Status: DC
Start: 2020-04-28 — End: 2020-05-24

## 2020-04-28 MED ORDER — ANASTROZOLE 1 MG PO TABS
1.0000 mg | ORAL_TABLET | Freq: Every day | ORAL | 3 refills | Status: DC
Start: 2020-04-28 — End: 2020-09-01

## 2020-04-28 NOTE — Assessment & Plan Note (Signed)
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  05/06/2019: Benign Breast cancer surveillance: 1.  Breast exam 04/28/2020: Benign 2.  Mammogram: 03/27/2019 benign  Return to clinic in 1 year for follow-up

## 2020-04-28 NOTE — Telephone Encounter (Signed)
Scheduled appts per 7/21 los. Pt declined print out of AVS and stated she would refer to mychart.

## 2020-04-28 NOTE — Progress Notes (Signed)
Patient Care Team: Patient, No Pcp Per as PCP - General ([place]) Hilty, Nadean Corwin, MD as PCP - Cardiology (Cardiology) Sater, Nanine Means, MD as Consulting Physician (Neurology) Jovita Kussmaul, MD as Consulting Physician (General Surgery) Nicholas Lose, MD as Consulting Physician (Hematology and Oncology) Kyung Rudd, MD as Consulting Physician (Radiation Oncology)  DIAGNOSIS:    ICD-10-CM   1. Malignant neoplasm of lower-outer quadrant of right breast of female, estrogen receptor positive ([place])  C50.511    Z17.0     SUMMARY OF ONCOLOGIC HISTORY: Oncology History  Malignant neoplasm of lower-outer quadrant of right breast of female, estrogen receptor positive ([place])  03/26/2017 Initial Diagnosis   Palpable right breast mass with a normal mammogram: 8:00 position by ultrasound 3.2 cm irregular mass: Grade 2 invasive lobular cancer ER 85%, PR 100%, Ki-67 2%, HER-2 negative ratio 1.5, T2 N0 stage II a clinical stage   04/20/2017 Genetic Testing   Genetic counseling and testing for hereditary cancer syndromes performed on . Results are negative for pathogenic mutations in 46 genes analyzed by [employer]'s Common Hereditary Cancers Panel. Results are dated . Genes tested: APC, ATM, AXIN2, BARD1, BMPR1A, BRCA1, BRCA2, BRIP1, CDH1, CDKN2A, CHEK2, CTNNA1, DICER1, EPCAM, GREM1, HOXB13, KIT, MEN1, MLH1, MSH2, MSH3, MSH6, MUTYH, NBN, NF1, NTHL1, PALB2, PDGFRA, PMS2, POLD1, POLE, PTEN, RAD50, RAD51C, RAD51D, SDHA, SDHB, SDHC, SDHD, SMAD4, SMARCA4, STK11, TP53, TSC1, TSC2, and VHL.  Variants of uncertain significance (not clinically actionable) were noted in APC and BRIP1.      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 with above-mentioned history of right breast cancer treated with lumpectomy, radiation, and who is currently on tamoxifen. She presents to the clinic today for follow-up.  She recently underwent bilateral salpingo-oophorectomy and is recovering from that.  She had it about 4 weeks ago.  She has profound hot flashes making her life extremely difficult.  She had stopped tamoxifen prior to surgery.  She is here to discuss switching her from tamoxifen to anastrozole.  ALLERGIES:  is allergic to pork-derived products.  MEDICATIONS:  Current Outpatient Medications  Medication Sig Dispense Refill  . acetaminophen (TYLENOL) 325 MG tablet Take 325-650 mg by mouth every 8 (eight) hours as needed (for pain or cramping).     Marland Kitchen amLODipine (NORVASC) 10 MG tablet TAKE 1 TABLET(10 MG) BY MOUTH DAILY (Patient taking differently: Take 10 mg by mouth daily. ) 90 tablet 2  . aspirin 81 MG chewable tablet Chew 1 tablet (81 mg total) by mouth daily.    Marland Kitchen atorvastatin (LIPITOR) 80 MG tablet TAKE 1 TABLET(80 MG) BY MOUTH DAILY (Patient taking differently: Take 80 mg by mouth every evening. ) 90 tablet 1  . baclofen (LIORESAL) 10 MG tablet TAKE BY MOUTH UP TO 4 TABLETS EVERY DAY FOR MUSCLE SPASMS 120 tablet 5  . celecoxib (CELEBREX) 200 MG capsule TAKE 1 CAPSULE(200 MG) BY MOUTH TWICE DAILY 30 capsule 3  . ferrous sulfate 325 (65 FE)  MG tablet Take 325 mg by mouth daily.    . fluticasone (FLONASE) 50 MCG/ACT nasal spray Place 1-2 sprays into both nostrils daily as needed for allergies or rhinitis.    . furosemide (LASIX) 20 MG tablet Take 1 tablet (20 mg total) by mouth daily as needed. (Patient taking differently: Take 20 mg by mouth daily as needed (fluid retention.). ) 90 tablet 0  . gabapentin (NEURONTIN) 300 MG capsule  Take 640m (3 capsules) by mouth three times daily (Patient taking differently: Take 900 mg by mouth in the morning, at noon, and at bedtime. ) 270 capsule 3  . ibuprofen (ADVIL) 600 MG tablet Take 1 tablet (600 mg total) by mouth every 6 (six) hours as needed for moderate pain. For AFTER surgery 15 tablet 0  . lisinopril (ZESTRIL) 20 MG tablet TAKE 1 TABLET(20 MG) BY MOUTH TWICE DAILY (Patient taking differently: Take 20 mg by mouth in the morning and at bedtime. ) 180 tablet 2  . metoprolol tartrate (LOPRESSOR) 25 MG tablet Take 1 tablet (25 mg total) by mouth 2 (two) times daily. 180 tablet 3  . nitroGLYCERIN (NITROSTAT) 0.4 MG SL tablet Place 1 tablet (0.4 mg total) under the tongue every 5 (five) minutes as needed for chest pain. 25 tablet 4  . oxyCODONE (OXY IR/ROXICODONE) 5 MG immediate release tablet Take 1 tablet (5 mg total) by mouth every 4 (four) hours as needed for severe pain. For AFTER surgery pain, do not take and drive, do not take with tramadol 10 tablet 0  . senna-docusate (SENOKOT-S) 8.6-50 MG tablet Take 2 tablets by mouth at bedtime. For AFTER surgery, do not take if having diarrhea 30 tablet 0  . tamoxifen (NOLVADEX) 20 MG tablet Take 1 tablet (20 mg total) by mouth daily. 90 tablet 3  . traMADol (ULTRAM) 50 MG tablet TAKE 1 TABLET(50 MG) BY MOUTH TWICE DAILY AS NEEDED 60 tablet 5   No current facility-administered medications for this visit.    PHYSICAL EXAMINATION: ECOG PERFORMANCE STATUS: 1 - Symptomatic but completely ambulatory  Vitals:   04/28/20 1501 04/28/20 1502  BP: (!) 163/93 (!) 156/88  Pulse: 63   Resp: 16   Temp: 98.5 F (36.9 C)   SpO2: 100%    Filed Weights   04/28/20 1501  Weight: 143 lb 9.6 oz (65.1 kg)     LABORATORY DATA:  I have reviewed the data as listed CMP Latest Ref Rng & Units 03/23/2020 11/14/2019 08/29/2019  Glucose 70 - 99 mg/dL 103(H) 99 80  BUN 6 - 20 mg/dL _0 Creatinine 0.44 - 1.00 mg/dL 0.80 1.17(H) 0.99  Sodium 135 - 145  mmol/L 138 142 139  Potassium 3.5 - 5.1 mmol/L 3.6 3.1(L) 3.6  Chloride 98 - 111 mmol/L 105 110 109  CO2 22 - 32 mmol/L 24 20(L) 21(L)  Calcium 8.9 - 10.3 mg/dL 9.0 9.0 9.2  Total Protein 6.5 - 8.1 g/dL 7.0 - -  Total Bilirubin 0.3 - 1.2 mg/dL 0.4 - -  Alkaline Phos 38 - 126 U/L 54 - -  AST 15 - 41 U/L 21 - -  ALT 0 - 44 U/L 15 - -    Lab Results  Component Value Date   WBC 8.4 03/23/2020   HGB 11.5 (L) 03/23/2020   HCT 34.8 (L) 03/23/2020   MCV 92.6 03/23/2020   PLT 304 03/23/2020   NEUTROABS 7.1 (H) 04/01/2018    ASSESSMENT & PLAN:  Malignant neoplasm of lower-outer quadrant of  right breast of female, estrogen receptor positive ([place]) 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 These include but not limited to insomnia, hot flashes, mood changes, vaginal dryness, bone density loss, and weight gain. We strongly believe that the benefits far outweigh the risks. Patient understands these risks and consented to starting treatment. Planned treatment duration is 5 years.  Bilateral breast tenderness:ultrasound in both breasts.  05/06/2019: Benign Breast cancer surveillance: 1.  Breast exam 04/28/2020: Benign 2.  Mammogram: 03/27/2019 benign  Return to clinic in 1 year for follow-up    No orders of the defined types were placed in this encounter.  The patient has a good  understanding of the overall plan. she agrees with it. she will call with any problems that may develop before the next visit here.  Total time spent: 30 mins including face to face time and time spent for planning, charting and coordination of care  Nicholas Lose, MD 04/28/2020  I, Cloyde Reams Dorshimer, am acting as scribe for Dr. Nicholas Lose.  I have reviewed the above documentation for accuracy and completeness, and I agree with the above.

## 2020-05-14 ENCOUNTER — Telehealth: Payer: Self-pay | Admitting: *Deleted

## 2020-05-14 NOTE — Telephone Encounter (Signed)
Ms Venning states she has been having pain in her left breast, close to rib cage. Has a mammogram scheduled for Tuesday. Dr Lindi Adie states no further action needed at this time. Will review mammogram. Pt notified

## 2020-05-24 ENCOUNTER — Inpatient Hospital Stay: Payer: 59 | Attending: Hematology and Oncology | Admitting: Gynecologic Oncology

## 2020-05-24 ENCOUNTER — Encounter: Payer: Self-pay | Admitting: Gynecologic Oncology

## 2020-05-24 ENCOUNTER — Other Ambulatory Visit: Payer: Self-pay | Admitting: *Deleted

## 2020-05-24 ENCOUNTER — Encounter: Payer: Self-pay | Admitting: Hematology and Oncology

## 2020-05-24 DIAGNOSIS — C50511 Malignant neoplasm of lower-outer quadrant of right female breast: Secondary | ICD-10-CM | POA: Diagnosis not present

## 2020-05-24 DIAGNOSIS — Z170 Estrogen receptor positive status [ER+]: Secondary | ICD-10-CM

## 2020-05-24 DIAGNOSIS — N951 Menopausal and female climacteric states: Secondary | ICD-10-CM

## 2020-05-24 MED ORDER — VENLAFAXINE HCL ER 75 MG PO CP24
75.0000 mg | ORAL_CAPSULE | Freq: Every day | ORAL | 0 refills | Status: DC
Start: 2020-05-24 — End: 2020-08-20

## 2020-05-24 NOTE — Progress Notes (Signed)
Gynecologic Oncology Telehealth Consult Note: Gyn-Onc  I connected with [name] on 05/24/20 at  2:30 PM EDT by telephone and verified that I am speaking with the correct person using two identifiers.  I discussed the limitations, risks, security and privacy concerns of performing an evaluation and management service by telemedicine and the availability of in-person appointments. I also discussed with the patient that there may be a patient responsible charge related to this service. The patient expressed understanding and agreed to proceed.  Other persons participating in the visit and their role in the encounter: None.  Patient's location: Home Provider's location: [facility]  Reason for Visit: follow-up discussion of menopausal symptoms after therapeutic BSO  Treatment History: Oncology History  Malignant neoplasm of lower-outer quadrant of right breast of female, estrogen receptor positive ([place])  03/26/2017 Initial Diagnosis   Palpable right breast mass with a normal mammogram: 8:00 position by ultrasound 3.2 cm irregular mass: Grade 2 invasive lobular cancer ER 85%, PR 100%, Ki-67 2%, HER-2 negative ratio 1.5, T2 N0 stage II a clinical stage   04/20/2017 Genetic Testing   Genetic counseling and testing for hereditary cancer syndromes performed on . Results are negative for pathogenic mutations in 46 genes analyzed by [facility]'s Common Hereditary Cancers Panel. Results are dated . Genes tested: APC, ATM, AXIN2, BARD1, BMPR1A, BRCA1, BRCA2, BRIP1, CDH1, CDKN2A, CHEK2, CTNNA1, DICER1, EPCAM, GREM1, HOXB13, KIT, MEN1, MLH1, MSH2, MSH3, MSH6, MUTYH, NBN, NF1, NTHL1, PALB2, PDGFRA, PMS2, POLD1, POLE, PTEN, RAD50, RAD51C, RAD51D, SDHA, SDHB, SDHC, SDHD, SMAD4, SMARCA4, STK11, TP53, TSC1, TSC2, and VHL.  Variants of uncertain significance (not clinically actionable) were noted in APC and BRIP1.      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  She continues to have some fatigue but notes that this is improving.  She saw her medical oncologist shortly after her visit with me and has now been transitioned to an aromatase inhibitor.  She was also started on venlafaxine for her hot flashes.  She is taking this at night and notes that her hot flashes have significantly improved at night although she continues to have some during the day as well.  She has also had persistent episodes of goosebumps but notes that these are occurring for a shorter period of time.  Past Medical/Surgical History: Past Medical History:  Diagnosis Date  . Anemia   . Anxiety   . Bipolar disorder in full remission ([place])   . CAD in native artery cardiologist--  dr hilty   a. 09/2014 Cath/PCI in setting of [place] - s/p 2.25 x 12 mm Promus Premier DES to mid RCA;  b. 11/2016 Myoview: EF 56%, no ischemia;  c. 12/2016 NSTEMI/PCI: LM nl, LAd 25p, LCX 30p, RCA 100p (2.75x38 Promus Premier DES), mRCA 10 ISR, EF 50-55%.  . CKD (chronic kidney disease), stage III   . Depression   . Genetic testing 04/23/2017   Ms. Thoen underwent genetic counseling and testing  for hereditary cancer syndromes on 04/05/2017. Her results were negative for mutations in all 46 genes analyzed by [facility]'s 46-gene Common Hereditary Cancers Panel. Genes analyzed include: APC, ATM, AXIN2, BARD1, BMPR1A, BRCA1, BRCA2, BRIP1, CDH1, CDKN2A, CHEK2, CTNNA1, DICER1, EPCAM, GREM1, HOXB13, KIT, MEN1, MLH1, MSH2, MSH3, MSH6, MUTYH, NBN,  . GERD (gastroesophageal reflux disease)   .  Headache   . History of external beam radiation therapy    right breast 07-27-2017  to 09-25-2017  . History of non-ST elevation myocardial infarction (NSTEMI)   . HOCM (hypertrophic obstructive cardiomyopathy) ([place]) followed by cardiology   a. 12/2016 Echo: EF 65-70%,  mod conc LVH, dynamic obstruction @ rest, peak velocity of 291 cm/sec w/ peak gradient of 65mHg, no rwma, Gr1 DD, triv TR, PASP 156mg.  . Marland Kitchenyperlipidemia   . Hypertension   . Malignant neoplasm of lower-outer quadrant of right breast of female, estrogen [facility] dr gu[facility]indi Adie dx 06/ 2018--- right breast invasive lobular carcinoma, ductal carcinoma w/ LCIS---- 06-21-2017 s/p right breast lumpecotmy w/ sln disseciton;   completed radiation 09-25-2017;  on tamoxifen  . [place]   2017  . S/P drug eluting coronary stent placement    09-14-2014---  [facility]  PCI with DES x1 to proxRCA  . [facility] dr saFelecia Shelling 01/ 2017  dx transverse myelitis w/ right side numbness (09-01-2019  currently lower extremity weakness, mucsle spasms, and gait disturbance)  . Wears glasses     Past Surgical History:  Procedure Laterality Date  . BREAST LUMPECTOMY WITH RADIOACTIVE SEED AND SENTINEL LYMPH NODE BIOPSY Right 06/21/2017   Procedure: RIGHT BREAST BRACKETED SEED GUIDED LUMPECTOMY AND SENTINEL LYMPH NODE BIOPSY;  Surgeon: ToJovita KussmaulMD;  [facility]: [facility];  Laterality: Right;  . CORONARY/GRAFT ACUTE MI REVASCULARIZATION N/A 12/31/2016   Procedure: Coronary/Graft Acute MI Revascularization;  Surgeon: MiSherren MochaMD;  [facility];  Service: Cardiovascular;  Laterality: N/A;  . DILATATION & CURETTAGE/HYSTEROSCOPY WITH MYOSURE N/A 09/02/2019   Procedure: DILATATION & CURETTAGE/HYSTEROSCOPY WITH MYOSURE;  Surgeon: FoAloha GellMD;  [place] [facility] Service: Gynecology;   Laterality: N/A;  . HERNIA REPAIR    . LEFT HEART CATH AND CORONARY ANGIOGRAPHY N/A 12/31/2016   Procedure: Left Heart Cath and Coronary Angiography;  Surgeon: MiSherren MochaMD;  [facility];  Service: Cardiovascular;  Laterality: N/A;  . LEFT HEART CATHETERIZATION WITH CORONARY ANGIOGRAM N/A 09/14/2014   Procedure: LEFT HEART CATHETERIZATION WITH CORONARY ANGIOGRAM;  Surgeon: ChBurnell BlanksMD;  [facility];  Service: Cardiovascular;  Laterality: N/A;  . MASS EXCISION N/A 03/30/2020   Procedure: EXCISION MASS RIGHT LABIA MINORA;  Surgeon: TuLafonda MossesMD;  Location: [facility];  Service: Gynecology;  Laterality: N/A;  . PERCUTANEOUS CORONARY STENT INTERVENTION (PCI-S)  09/14/2014   Procedure: PERCUTANEOUS CORONARY STENT INTERVENTION (PCI-S);  Surgeon: ChBurnell BlanksMD;  [facility];  Service: Cardiovascular;;  . ROBOTIC ASSISTED SALPINGO OOPHERECTOMY Bilateral 03/30/2020   Procedure: XI ROBOTIC ASSISTED SALPINGO OOPHORECTOMY;  Surgeon: TuLafonda MossesMD;  Location: [facility];  Service: Gynecology;  Laterality: Bilateral;  . TONSILLECTOMY  2005  . UMBILICAL HERNIA REPAIR  2001; 2005    Family History  Problem Relation Age of Onset  . Diabetes Mother   . Heart disease Mother   . Hyperlipidemia Mother   . Hypertension Mother   .  Breast cancer Mother   . Lung cancer Father   . Drug abuse Father   . Brain cancer Father 70  . Bone cancer Father 45  . Drug abuse Sister   . Mental illness Brother   . Mental illness Maternal Grandmother   . Stomach cancer Paternal Grandmother 16       d.75    Social History   Socioeconomic History  . Marital status: Single    Spouse name: Not on file  . Number of children: 0  . Years of education: masters  . Highest education level: Not on file  Occupational History  . Occupation: Therapist, art at a call center    Employer: [employer]  Tobacco Use  . Smoking status: Former Smoker     Packs/day: 0.50    Years: 30.00    Pack years: 15.00    Types: Cigarettes    Quit date: 10/05/2015    Years since quitting: 4.6  . Smokeless tobacco: Never Used  Vaping Use  . Vaping Use: Former  Substance and Sexual Activity  . Alcohol use: Yes    Alcohol/week: 2.0 standard drinks    Types: 2 Cans of beer per week    Comment: occas.  . Drug use: No  . Sexual activity: Not Currently  Other Topics Concern  . Not on file  Social History Narrative   Consumes 2 cups of caffeine daily   Social Determinants of Health   Financial Resource Strain:   . Difficulty of Paying Living Expenses:   Food Insecurity:   . Worried About Charity fundraiser in the Last Year:   . Arboriculturist in the Last Year:   Transportation Needs:   . Film/video editor (Medical):   Marland Kitchen Lack of Transportation (Non-Medical):   Physical Activity:   . Days of Exercise per Week:   . Minutes of Exercise per Session:   Stress:   . Feeling of Stress :   Social Connections:   . Frequency of Communication with Friends and Family:   . Frequency of Social Gatherings with Friends and Family:   . Attends Religious Services:   . Active Member of Clubs or Organizations:   . Attends Archivist Meetings:   Marland Kitchen Marital Status:     Current Medications:  Current Outpatient Medications:  .  acetaminophen (TYLENOL) 325 MG tablet, Take 325-650 mg by mouth every 8 (eight) hours as needed (for pain or cramping). , Disp: , Rfl:  .  amLODipine (NORVASC) 10 MG tablet, TAKE 1 TABLET(10 MG) BY MOUTH DAILY (Patient taking differently: Take 10 mg by mouth daily. ), Disp: 90 tablet, Rfl: 2 .  anastrozole (ARIMIDEX) 1 MG tablet, Take 1 tablet (1 mg total) by mouth daily., Disp: 90 tablet, Rfl: 3 .  aspirin 81 MG chewable tablet, Chew 1 tablet (81 mg total) by mouth daily., Disp: , Rfl:  .  atorvastatin (LIPITOR) 80 MG tablet, TAKE 1 TABLET(80 MG) BY MOUTH DAILY (Patient taking differently: Take 80 mg by mouth every  evening. ), Disp: 90 tablet, Rfl: 1 .  baclofen (LIORESAL) 10 MG tablet, TAKE BY MOUTH UP TO 4 TABLETS EVERY DAY FOR MUSCLE SPASMS, Disp: 120 tablet, Rfl: 5 .  celecoxib (CELEBREX) 200 MG capsule, TAKE 1 CAPSULE(200 MG) BY MOUTH TWICE DAILY, Disp: 30 capsule, Rfl: 3 .  ferrous sulfate 325 (65 FE) MG tablet, Take 325 mg by mouth daily., Disp: , Rfl:  .  fluticasone (FLONASE) 50 MCG/ACT nasal spray,  Place 1-2 sprays into both nostrils daily as needed for allergies or rhinitis., Disp: , Rfl:  .  furosemide (LASIX) 20 MG tablet, Take 1 tablet (20 mg total) by mouth daily as needed. (Patient taking differently: Take 20 mg by mouth daily as needed (fluid retention.). ), Disp: 90 tablet, Rfl: 0 .  gabapentin (NEURONTIN) 300 MG capsule, Take 638m (3 capsules) by mouth three times daily (Patient taking differently: Take 900 mg by mouth in the morning, at noon, and at bedtime. ), Disp: 270 capsule, Rfl: 3 .  ibuprofen (ADVIL) 600 MG tablet, Take 1 tablet (600 mg total) by mouth every 6 (six) hours as needed for moderate pain. For AFTER surgery, Disp: 15 tablet, Rfl: 0 .  lisinopril (ZESTRIL) 20 MG tablet, TAKE 1 TABLET(20 MG) BY MOUTH TWICE DAILY (Patient taking differently: Take 20 mg by mouth in the morning and at bedtime. ), Disp: 180 tablet, Rfl: 2 .  metoprolol tartrate (LOPRESSOR) 25 MG tablet, Take 1 tablet (25 mg total) by mouth 2 (two) times daily., Disp: 180 tablet, Rfl: 3 .  nitroGLYCERIN (NITROSTAT) 0.4 MG SL tablet, Place 1 tablet (0.4 mg total) under the tongue every 5 (five) minutes as needed for chest pain., Disp: 25 tablet, Rfl: 4 .  oxyCODONE (OXY IR/ROXICODONE) 5 MG immediate release tablet, Take 1 tablet (5 mg total) by mouth every 4 (four) hours as needed for severe pain. For AFTER surgery pain, do not take and drive, do not take with tramadol, Disp: 10 tablet, Rfl: 0 .  senna-docusate (SENOKOT-S) 8.6-50 MG tablet, Take 2 tablets by mouth at bedtime. For AFTER surgery, do not take if having  diarrhea, Disp: 30 tablet, Rfl: 0 .  traMADol (ULTRAM) 50 MG tablet, TAKE 1 TABLET(50 MG) BY MOUTH TWICE DAILY AS NEEDED, Disp: 60 tablet, Rfl: 5 .  venlafaxine XR (EFFEXOR XR) 37.5 MG 24 hr capsule, Take 1 capsule (37.5 mg total) by mouth daily with breakfast., Disp: 30 capsule, Rfl: 3  Review of Symptoms: Pertinent positives as per HPI, otherwise review of systems negative.  Physical Exam: There were no vitals taken for this visit. Deferred given limitations of phone visit  Laboratory & Radiologic Studies: None new  Assessment & Plan: Anna Fettesis a 49y.o. woman s/p robotic BSO and labial cyst excision on 6/22 in the setting of ER+ breast cancer.  The patient continues to heal well since surgery.  We discussed that fatigue still at this point is normal and should continue to improve with time.  She has already been started on venlafaxine for her menopausal symptoms.  I defer to her medical oncologist for titration of this medication.  All the patient's questions were answered today and she knows that she can reach out to my office at any point in the future if she has questions or concerns.  I discussed the assessment and treatment plan with the patient. The patient was provided with an opportunity to ask questions and all were answered. The patient agreed with the plan and demonstrated an understanding of the instructions.   The patient was advised to call back or see an in-person evaluation if the symptoms worsen or if the condition fails to improve as anticipated.   14 minutes of total time was spent for this patient encounter, including preparation, face-to-face counseling with the patient and coordination of care, and documentation of the encounter.   KJeral Pinch MD  Division of Gynecologic Oncology  Department of Obstetrics and Gynecology  [place]  [employer]

## 2020-05-27 ENCOUNTER — Encounter: Payer: Self-pay | Admitting: Hematology and Oncology

## 2020-05-30 ENCOUNTER — Other Ambulatory Visit: Payer: Self-pay | Admitting: Neurology

## 2020-06-01 ENCOUNTER — Telehealth: Payer: Self-pay | Admitting: Neurology

## 2020-06-01 NOTE — Telephone Encounter (Addendum)
Refill last sent to pharmacy 03/11/20 #30, with 3 refills. Should be refills on file at the pharmacy. I called pt. She has not contacted her pharmacy yet, she will f/u with them about needing refill.

## 2020-06-01 NOTE — Telephone Encounter (Signed)
Pt request refill celecoxib (CELEBREX) 200 MG capsule at [facility] [phone]

## 2020-06-02 ENCOUNTER — Other Ambulatory Visit: Payer: Self-pay | Admitting: Neurology

## 2020-06-03 MED ORDER — CELECOXIB 200 MG PO CAPS: ORAL_CAPSULE | ORAL | 3 refills | Status: DC

## 2020-06-03 NOTE — Telephone Encounter (Signed)
Re-sent a new prescription to the pharmacy.

## 2020-06-03 NOTE — Addendum Note (Signed)
Addended by: Wyvonnia Lora on: 06/03/2020 01:13 PM   Modules accepted: Orders

## 2020-06-03 NOTE — Telephone Encounter (Signed)
Pt said [facility] today [id number]  was denied, not having a prescription. Pt ask could someone call the pharmacy.

## 2020-06-11 ENCOUNTER — Other Ambulatory Visit: Payer: 59

## 2020-06-15 ENCOUNTER — Encounter: Payer: Self-pay | Admitting: Hematology and Oncology

## 2020-06-15 ENCOUNTER — Telehealth (INDEPENDENT_AMBULATORY_CARE_PROVIDER_SITE_OTHER): Payer: 59 | Admitting: Neurology

## 2020-06-15 ENCOUNTER — Encounter: Payer: Self-pay | Admitting: Neurology

## 2020-06-15 DIAGNOSIS — R29818 Other symptoms and signs involving the nervous system: Secondary | ICD-10-CM | POA: Diagnosis not present

## 2020-06-15 DIAGNOSIS — C50511 Malignant neoplasm of lower-outer quadrant of right female breast: Secondary | ICD-10-CM

## 2020-06-15 DIAGNOSIS — R208 Other disturbances of skin sensation: Secondary | ICD-10-CM

## 2020-06-15 DIAGNOSIS — M792 Neuralgia and neuritis, unspecified: Secondary | ICD-10-CM

## 2020-06-15 DIAGNOSIS — Z170 Estrogen receptor positive status [ER+]: Secondary | ICD-10-CM

## 2020-06-15 DIAGNOSIS — M4802 Spinal stenosis, cervical region: Secondary | ICD-10-CM | POA: Diagnosis not present

## 2020-06-15 DIAGNOSIS — G373 Acute transverse myelitis in demyelinating disease of central nervous system: Secondary | ICD-10-CM

## 2020-06-15 NOTE — Telephone Encounter (Signed)
Placed completed/signed DMV handicap license plate form in mail for pt per MD request.

## 2020-06-15 NOTE — Progress Notes (Signed)
[facility]  PATIENT: Anna Henson DOB: 07-19-1971  REFERRING DOCTOR OR PCP:  Rikki Spearing _________________________________   HISTORICAL  CHIEF COMPLAINT:  No chief complaint on file.   HISTORY OF PRESENT ILLNESS:  Anna Henson is a 49 yo woman with a transverse myelitis in late January 2017.    Virtual Visit via Video Note 06/15/2020 I connected with [name] @ on 06/15/20 at  1:00 PM EDT by a video enabled telemedicine application and verified that I am speaking with the correct person.  I discussed the limitations of evaluation and management by telemedicine and the availability of in person appointments. The patient expressed understanding and agreed to proceed.    Patient was home.   Provider in office  History of Present Illness: She has neck pain and gets an electrical sensation down her spine and pain that radiates down the back to the rib cage.   She feels it causes a cough.   There is numbness in both hands.    She had a burst like severe pain below the breast on the right like something popped.  She takes gabapentin 900 mg po tid and tramadol 50 mg po bid, Celebrex 200 mg po qd, baclofen 10 mg 3-4 times a day.  These medications help tingling pain some.  The addition of tramadol has helped the most.  Compared to earlier this year she feels it is mildly worse She fell out of bed recently while sleeping landing on right shoulder.  No LOC.   She has had a bad year.   Her mom died earlier this year with metastatic breast cancer and now her sister has stage 4 pancreatic cancer.  She recently did a hysterectomy and now has to do a breast biopsy after calcification seen on mammogram.   She has a h/o breast cancer.    She has just been exposed to Covid-19 (she was negative) but needs to work from home this week.     At the last visit, she felt her transverse myelitis symptoms were worse so repeat imaging was performed: December 24, 2019 MRI of the cervical  spine is normal (MRI February 2017 has shown T2 hyperintensity from the medulla to the mid cervical spine) she does.  She does have degenerative changes.  There is moderate spinal stenosis at C5-C6 with moderate right foraminal narrowing.  At C6-C7, she has left-sided disc osteophyte complex that could affect the left C7 nerve root.  December 24, 2019 MRI of the thoracic spine showed a normal spinal cord and no significant degenerative changes.  Observations/Objective:  She is a well-developed well-nourished woman in no acute distress.  The head is normocephalic and atraumatic.  Sclera are anicteric.  Visible skin appears normal.  The neck has a good range of motion.  Pharynx and tongue have normal appearance.  She is alert and fully oriented with fluent speech and good attention, knowledge and memory.  Extraocular muscles are intact.  Facial strength is normal.  Palatal elevation and tongue protrusion are midline.  She appears to have normal strength in the arms.  Rapid alternating movements and finger-nose-finger are performed well.  Assessment and Plan:  Transverse myelitis ([place])  Dysesthesia  Cervical spinal stenosis  Lhermitte's sign positive  Malignant neoplasm of lower-outer quadrant of right breast of female, estrogen receptor positive ([place])   1.  She has not yet had the COVID-19 vaccination.  She is concerned because vaccines have sometimes been associated with transverse myelitis.  I discussed  with her that this has not appeared to be a problem with the Pfizer or Moderna vaccines and that I have been recommending vaccinations to my MS patients who also can get spinal cord lesions.  She will give this more thought. 2.   Due to her continued neurologic disability, I will fill out a handicap parking form 3.   If dysesthesia worsens despite her current medication, consider adding lamotrigine and titrate slowly to 100 mg twice daily 4.   She will return to see [place] in 6 months or sooner if there  are new or worsening neurologic symptoms. Follow Up Instructions: I discussed the assessment and treatment plan with the patient. The patient was provided an opportunity to ask questions and all were answered. The patient agreed with the plan and demonstrated an understanding of the instructions.    The patient was advised to call back or seek an in-person evaluation if the symptoms worsen or if the condition fails to improve as anticipated.  I provided 25 minutes of non-face-to-face time during this encounter.  Transverse myelitis history: Transverse myelitis history:    In late January 2017,  she had a right sided headache and went to bed.  When she woke up, she noted numbness in the right neck, shoulder and arm.   Numbness worsened over the next fe days.   She went to her PCP an was told she might have sinusitis and Amoxicillin was prescribed.   After a couple more days, she saw another doctor and had a neck soft tissue CT scan.   That CT scan showed that she had an increase of adenoid tissue and reactive type lymph nodes.  By that time, numbness extended to the fingertips on the riht but not into her leg.   She was told to go to the emergency room for further evaluation. In the emergency room she had an MRI of the cervical spine and the brain. The MRI of the cervical spine showed a focus that extended from the lower medulla on the right down to C5 posteriorly and more medially within the spinal cord. She also had some enhancement around the level of the cervical medullary junction. She was admitted to the hospital for further treatment and testing.    She was treated with 5 days of IV Solu-Medrol. During that time, her symptoms of numbness improved.  She has not returned to baseline.    She had many tests performed including lumbar puncture for CSF analysis and a thoracic spine MRI and serology blood tests. For the most part, the evaluation was negative.  The 02/02/16 cervical spine shows resolution of the  prior enhancing focus that was seen at the cervicomedullary junction.    Data:    MRI  has a longitudinal lesion extending from the lower medulla on the right down to C5/C6 on MRI January 2017.  The enhancement does not extend up to the medulla.  Cervical spine also showed mild spinal stenosis at C3-C4 and mild to moderate spinal stenosis at C5-C6 and mild spinal stenosis at C6-C7. There is some foraminal narrowing at those levels but no definite nerve root compression.  Elsewhere the brain, there were some subcortical and deep white matter T2/FLAIR hyperintense foci but not in a pattern that would be typical for MS. The thoracic spine was normal.    I also reviewed her many laboratory tests performed while she was at [employer]. Neuromyelitis optica antibodies are negative. She did not have oligoclonal bands. ANCA, ANA,  SSA/SSB, ACE and Vit B-12 were all normal or negative.  She had Epstein-Barr IgG but not IgM. CSF cell counts did not show increase in white blood cells.  The 02/02/16 cervical spine MRI shows resolution of the prior enhancing focus that was seen at the cervicomedullary/upper cervical spine  .    December 24, 2019 MRI of the cervical spine is normal (MRI February 2017 has shown T2 hyperintensity from the medulla to the mid cervical spine) she does.  She does have degenerative changes.  There is moderate spinal stenosis at C5-C6 with moderate right foraminal narrowing.  At C6-C7, she has left-sided disc osteophyte complex that could affect the left C7 nerve root.  December 24, 2019 MRI of the thoracic spine showed a normal spinal cord and no significant degenerative changes.   REVIEW OF SYSTEMS: Constitutional: No fevers, chills, sweats, or change in appetite.  She notes some fatigue Eyes: No visual changes, double vision, eye pain Ear, nose and throat: No hearing loss, ear pain, nasal congestion, sore throat Cardiovascular: No chest pain, palpitations.   H/O angina, CAD with  stent Respiratory: No shortness of breath at rest or with exertion.   No wheezes GastrointestinaI: No nausea, vomiting, diarrhea, abdominal pain, fecal incontinence Genitourinary: Mild urgency and frequency.  No nocturia. Musculoskeletal: No neck pain, back pain Integumentary: No rash, pruritus, skin lesions Neurological: as above Psychiatric: No depression at this time.  No anxiety Endocrine: No palpitations, diaphoresis, change in appetite, change in weigh or increased thirst Hematologic/Lymphatic: No anemia, purpura, petechiae. Allergic/Immunologic: No itchy/runny eyes, nasal congestion, recent allergic reactions, rashes  ALLERGIES: Allergies  Allergen Reactions  . Pork-Derived Products Other (See Comments)    Does NOT eat pork    HOME MEDICATIONS:  Current Outpatient Medications:  .  acetaminophen (TYLENOL) 325 MG tablet, Take 325-650 mg by mouth every 8 (eight) hours as needed (for pain or cramping). , Disp: , Rfl:  .  amLODipine (NORVASC) 10 MG tablet, TAKE 1 TABLET(10 MG) BY MOUTH DAILY (Patient taking differently: Take 10 mg by mouth daily. ), Disp: 90 tablet, Rfl: 2 .  anastrozole (ARIMIDEX) 1 MG tablet, Take 1 tablet (1 mg total) by mouth daily., Disp: 90 tablet, Rfl: 3 .  aspirin 81 MG chewable tablet, Chew 1 tablet (81 mg total) by mouth daily., Disp: , Rfl:  .  atorvastatin (LIPITOR) 80 MG tablet, TAKE 1 TABLET(80 MG) BY MOUTH DAILY (Patient taking differently: Take 80 mg by mouth every evening. ), Disp: 90 tablet, Rfl: 1 .  baclofen (LIORESAL) 10 MG tablet, TAKE BY MOUTH UP TO 4 TABLETS EVERY DAY FOR MUSCLE SPASMS, Disp: 120 tablet, Rfl: 5 .  celecoxib (CELEBREX) 200 MG capsule, TAKE 1 CAPSULE(200 MG) BY MOUTH TWICE DAILY, Disp: 30 capsule, Rfl: 3 .  ferrous sulfate 325 (65 FE) MG tablet, Take 325 mg by mouth daily., Disp: , Rfl:  .  fluticasone (FLONASE) 50 MCG/ACT nasal spray, Place 1-2 sprays into both nostrils daily as needed for allergies or rhinitis., Disp: ,  Rfl:  .  furosemide (LASIX) 20 MG tablet, Take 1 tablet (20 mg total) by mouth daily as needed. (Patient taking differently: Take 20 mg by mouth daily as needed (fluid retention.). ), Disp: 90 tablet, Rfl: 0 .  gabapentin (NEURONTIN) 300 MG capsule, Take 66m (3 capsules) by mouth three times daily (Patient taking differently: Take 900 mg by mouth in the morning, at noon, and at bedtime. ), Disp: 270 capsule, Rfl: 3 .  ibuprofen (ADVIL) 600 MG  tablet, Take 1 tablet (600 mg total) by mouth every 6 (six) hours as needed for moderate pain. For AFTER surgery, Disp: 15 tablet, Rfl: 0 .  lisinopril (ZESTRIL) 20 MG tablet, TAKE 1 TABLET(20 MG) BY MOUTH TWICE DAILY (Patient taking differently: Take 20 mg by mouth in the morning and at bedtime. ), Disp: 180 tablet, Rfl: 2 .  metoprolol tartrate (LOPRESSOR) 25 MG tablet, Take 1 tablet (25 mg total) by mouth 2 (two) times daily., Disp: 180 tablet, Rfl: 3 .  nitroGLYCERIN (NITROSTAT) 0.4 MG SL tablet, Place 1 tablet (0.4 mg total) under the tongue every 5 (five) minutes as needed for chest pain., Disp: 25 tablet, Rfl: 4 .  oxyCODONE (OXY IR/ROXICODONE) 5 MG immediate release tablet, Take 1 tablet (5 mg total) by mouth every 4 (four) hours as needed for severe pain. For AFTER surgery pain, do not take and drive, do not take with tramadol, Disp: 10 tablet, Rfl: 0 .  senna-docusate (SENOKOT-S) 8.6-50 MG tablet, Take 2 tablets by mouth at bedtime. For AFTER surgery, do not take if having diarrhea, Disp: 30 tablet, Rfl: 0 .  traMADol (ULTRAM) 50 MG tablet, TAKE 1 TABLET(50 MG) BY MOUTH TWICE DAILY AS NEEDED, Disp: 60 tablet, Rfl: 5 .  venlafaxine XR (EFFEXOR-XR) 75 MG 24 hr capsule, Take 1 capsule (75 mg total) by mouth daily with breakfast., Disp: 90 capsule, Rfl: 0  PAST MEDICAL HISTORY: Past Medical History:  Diagnosis Date  . Anemia   . Anxiety   . Bipolar disorder in full remission ([place])   . CAD in native artery cardiologist--  dr hilty   a. 09/2014 Cath/PCI  in setting of [place] - s/p 2.25 x 12 mm Promus Premier DES to mid RCA;  b. 11/2016 Myoview: EF 56%, no ischemia;  c. 12/2016 NSTEMI/PCI: LM nl, LAd 25p, LCX 30p, RCA 100p (2.75x38 Promus Premier DES), mRCA 10 ISR, EF 50-55%.  . CKD (chronic kidney disease), stage III   . Depression   . Genetic testing 04/23/2017   Ms. Dapolito underwent genetic counseling and testing for hereditary cancer syndromes on 04/05/2017. Her results were negative for mutations in all 46 genes analyzed by [facility]'s 46-gene Common Hereditary Cancers Panel. Genes analyzed include: APC, ATM, AXIN2, BARD1, BMPR1A, BRCA1, BRCA2, BRIP1, CDH1, CDKN2A, CHEK2, CTNNA1, DICER1, EPCAM, GREM1, HOXB13, KIT, MEN1, MLH1, MSH2, MSH3, MSH6, MUTYH, NBN,  . GERD (gastroesophageal reflux disease)   . Headache   . History of external beam radiation therapy    right breast 07-27-2017  to 09-25-2017  . History of non-ST elevation myocardial infarction (NSTEMI)   . HOCM (hypertrophic obstructive cardiomyopathy) ([place]) followed by cardiology   a. 12/2016 Echo: EF 65-70%,  mod conc LVH, dynamic obstruction @ rest, peak velocity of 291 cm/sec w/ peak gradient of 81mHg, no rwma, Gr1 DD, triv TR, PASP 168mg.  . Marland Kitchenyperlipidemia   . Hypertension   . Malignant neoplasm of lower-outer quadrant of right breast of female, [facility] dr gu[facility]indi Adie dx 06/ 2018--- right breast invasive lobular carcinoma, ductal carcinoma w/ LCIS---- 06-21-2017 s/p right breast lumpecotmy w/ sln disseciton;   completed radiation 09-25-2017;  on tamoxifen  . [place]   [place]  . S/P drug eluting coronary stent placement    09-14-2014[facility]  PCI with DES x1 to proxRCA  . [facility] dr sa[facility]lecia Shelling 01/ 2017  dx transverse myelitis w/ right side numbness (  09-01-2019  currently lower extremity weakness, mucsle spasms, and gait disturbance)  . Wears glasses     PAST SURGICAL  HISTORY: Past Surgical History:  Procedure Laterality Date  . BREAST LUMPECTOMY WITH RADIOACTIVE SEED AND SENTINEL LYMPH NODE BIOPSY Right 06/21/2017   Procedure: RIGHT BREAST BRACKETED SEED GUIDED LUMPECTOMY AND SENTINEL LYMPH NODE BIOPSY;  Surgeon: Jovita Kussmaul, MD;  Location: [place];  Service: General;  Laterality: Right;  . CORONARY/GRAFT ACUTE MI REVASCULARIZATION N/A 12/31/2016   Procedure: Coronary/Graft Acute MI Revascularization;  Surgeon: Sherren Mocha, MD;  Location: [place] CV [facility];  Service: Cardiovascular;  Laterality: N/A;  . DILATATION & CURETTAGE/HYSTEROSCOPY WITH MYOSURE N/A 09/02/2019   Procedure: DILATATION & CURETTAGE/HYSTEROSCOPY WITH MYOSURE;  Surgeon: Aloha Gell, MD;  Location: [place];  Service: Gynecology;  Laterality: N/A;  . HERNIA REPAIR    . LEFT HEART CATH AND CORONARY ANGIOGRAPHY N/A 12/31/2016   Procedure: Left Heart Cath and Coronary Angiography;  Surgeon: Sherren Mocha, MD;  Location: [place] [facility];  Service: Cardiovascular;  Laterality: N/A;  . LEFT HEART CATHETERIZATION WITH CORONARY ANGIOGRAM N/A 09/14/2014   Procedure: LEFT HEART CATHETERIZATION WITH CORONARY ANGIOGRAM;  Surgeon: Burnell Blanks, MD;  [facility];  Service: Cardiovascular;  Laterality: N/A;  . MASS EXCISION N/A 03/30/2020   Procedure: EXCISION MASS RIGHT LABIA MINORA;  Surgeon: Lafonda Mosses, MD;  Location: [facility];  Service: Gynecology;  Laterality: N/A;  . PERCUTANEOUS CORONARY STENT INTERVENTION (PCI-S)  09/14/2014   Procedure: PERCUTANEOUS CORONARY STENT INTERVENTION (PCI-S);  Surgeon: Burnell Blanks, MD;  Location: [facility] CATH LAB;  Service: Cardiovascular;;  . ROBOTIC ASSISTED SALPINGO OOPHERECTOMY Bilateral 03/30/2020   Procedure: XI ROBOTIC ASSISTED SALPINGO OOPHORECTOMY;  Surgeon: Lafonda Mosses, MD;  Location: [facility];  Service: Gynecology;  Laterality: Bilateral;  . TONSILLECTOMY  2005  . UMBILICAL HERNIA REPAIR  2001;  2005    FAMILY HISTORY: Family History  Problem Relation Age of Onset  . Diabetes Mother   . Heart disease Mother   . Hyperlipidemia Mother   . Hypertension Mother   . Breast cancer Mother   . Lung cancer Father   . Drug abuse Father   . Brain cancer Father 55  . Bone cancer Father 37  . Drug abuse Sister   . Mental illness Brother   . Mental illness Maternal Grandmother   . Stomach cancer Paternal Grandmother 1       d.75      DIAGNOSTIC DATA (LABS, IMAGING, TESTING) - I reviewed patient records, labs, notes, testing and imaging myself where available.  Lab Results  Component Value Date   WBC 8.4 03/23/2020   HGB 11.5 (L) 03/23/2020   HCT 34.8 (L) 03/23/2020   MCV 92.6 03/23/2020   PLT 304 03/23/2020      Component Value Date/Time   NA 138 03/23/2020 1541   NA 140 07/09/2018 1509   NA 139 04/04/2017 0835   K 3.6 03/23/2020 1541   K 3.6 04/04/2017 0835   CL 105 03/23/2020 1541   CO2 24 03/23/2020 1541   CO2 25 04/04/2017 0835   GLUCOSE 103 (H) 03/23/2020 1541   GLUCOSE 83 04/04/2017 0835   BUN 12 03/23/2020 1541   BUN 14 07/09/2018 1509   BUN 13.9 04/04/2017 0835   CREATININE 0.80 03/23/2020 1541   CREATININE 1.19 (H) 04/01/2018 1015   CREATININE 1.3 (H) 04/04/2017 0835   CALCIUM 9.0 03/23/2020 1541   CALCIUM 10.0 04/04/2017 0835  PROT 7.0 03/23/2020 1541   PROT 7.7 07/09/2018 1509   PROT 6.7 04/04/2017 0835   ALBUMIN 4.2 03/23/2020 1541   ALBUMIN 4.8 07/09/2018 1509   ALBUMIN 3.9 04/04/2017 0835   AST 21 03/23/2020 1541   AST 18 04/01/2018 1015   AST 23 04/04/2017 0835   ALT 15 03/23/2020 1541   ALT 11 04/01/2018 1015   ALT 22 04/04/2017 0835   ALKPHOS 54 03/23/2020 1541   ALKPHOS 69 04/04/2017 0835   BILITOT 0.4 03/23/2020 1541   BILITOT 0.2 07/09/2018 1509   BILITOT <0.2 (L) 04/01/2018 1015   BILITOT <0.22 04/04/2017 0835   GFRNONAA >60 03/23/2020 1541   GFRNONAA 54 (L) 04/01/2018 1015   GFRNONAA 56 (L) 11/24/2015 1113   GFRAA >60  03/23/2020 1541   GFRAA >60 04/01/2018 1015   GFRAA 65 11/24/2015 1113   Lab Results  Component Value Date   CHOL 149 07/09/2018   HDL 42 07/09/2018   LDLCALC 87 07/09/2018   TRIG 99 07/09/2018   CHOLHDL 3.5 07/09/2018   Lab Results  Component Value Date   HGBA1C 5.8 02/22/2016   Lab Results  Component Value Date   VITAMINB12 541 11/12/2015   Lab Results  Component Value Date   TSH 1.648 12/31/2016         A. Felecia Shelling, MD, PhD 10/15/3565, 0:14 PM Certified in Neurology, Clinical Neurophysiology, Sleep Medicine, Pain Medicine and Neuroimaging  [facility] [address] [place], [place] [address] [phone]

## 2020-06-21 ENCOUNTER — Encounter: Payer: Self-pay | Admitting: Neurology

## 2020-06-21 ENCOUNTER — Other Ambulatory Visit: Payer: Self-pay | Admitting: Radiology

## 2020-06-21 NOTE — Progress Notes (Signed)
x

## 2020-06-30 ENCOUNTER — Other Ambulatory Visit: Payer: Self-pay | Admitting: Internal Medicine

## 2020-06-30 DIAGNOSIS — E785 Hyperlipidemia, unspecified: Secondary | ICD-10-CM

## 2020-07-06 DIAGNOSIS — M792 Neuralgia and neuritis, unspecified: Secondary | ICD-10-CM | POA: Insufficient documentation

## 2020-07-06 MED ORDER — LAMOTRIGINE 100 MG PO TABS: 100.0000 mg | ORAL_TABLET | Freq: Two times a day (BID) | ORAL | 11 refills | Status: DC

## 2020-07-06 MED ORDER — LAMOTRIGINE 25 MG PO TABS: ORAL_TABLET | ORAL | 0 refills | Status: DC

## 2020-07-06 NOTE — Telephone Encounter (Signed)
Reviewed most recent office visit with Dr. Felecia Shelling on June 15, 2020,  History of transverse myelitis, with neuropathic pain,  Dr. Felecia Shelling was consider starting patient on titrating dose of lamotrigine  Meds ordered this encounter  Medications  . lamoTRIgine (LAMICTAL) 25 MG tablet    Sig: 1 tab bid x one week 2 tab bid x 2nd week 3 tab bid x 3rd week    Dispense:  240 tablet    Refill:  0  . lamoTRIgine (LAMICTAL) 100 MG tablet    Sig: Take 1 tablet (100 mg total) by mouth 2 (two) times daily.    Dispense:  60 tablet    Refill:  11    Please advise patient potential side effect, she needs to call office and stop medication immediately if she develops rash  Common  Dermatologic: Rash (7% to 14% )  Gastrointestinal: Abdominal pain (immediate-release, 5% to 10% ), Diarrhea (5% to 11% ), Indigestion (immediate-release, 2% to 7% ), Nausea (immediate-release, 7% to 25%; extended-release, 7% ), Vomiting (immediate-release, 5% to 20%; extended-release, 6% ), Xerostomia (immediate-release, 6%; extended-release, 2% )  Musculoskeletal: Backache (immediate-release, 8% )  Neurologic: Asthenia (2% to 8% ), Ataxia (immediate-release, 2% to 11% ), Coordination problem (3% to 7% ), Dizziness (immediate-release, 7% to 54%; extended release, 14% ), Headache (immediate-release, 29% ), Insomnia (immediate-release, 5% to 10% ), Somnolence (immediate-release, 9% to 17%; extended-release, 5% ), Tremor (4% to 10% )  Ophthalmic: Blurred vision (immediate-release, 11% to 25% (adult) and 4% (pediatric); extended-release, 3% ), Diplopia (immediate-release, 24% to 49% (adult) and 5% (pediatric); extended-release, 5% )  Reproductive: Dysmenorrhea (immediate-release, 5% to 7% )  Respiratory: Pain in throat (immediate-release, 10% to 14%; extended-release, 3% ), Rhinitis (immediate-release, 7% to 14% )  Other: Fever (immediate-release, 6% )

## 2020-07-08 ENCOUNTER — Other Ambulatory Visit: Payer: Self-pay | Admitting: Neurology

## 2020-07-08 MED ORDER — CELECOXIB 200 MG PO CAPS
ORAL_CAPSULE | ORAL | 5 refills | Status: DC
Start: 2020-07-08 — End: 2020-10-09

## 2020-07-08 MED ORDER — OXCARBAZEPINE 150 MG PO TABS: 300.0000 mg | ORAL_TABLET | Freq: Two times a day (BID) | ORAL | 5 refills | Status: DC

## 2020-07-29 ENCOUNTER — Telehealth: Payer: 59 | Admitting: Hematology and Oncology

## 2020-08-03 ENCOUNTER — Telehealth: Payer: Self-pay | Admitting: *Deleted

## 2020-08-03 NOTE — Telephone Encounter (Signed)
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

## 2020-08-10 ENCOUNTER — Encounter: Payer: Self-pay | Admitting: Hematology and Oncology

## 2020-08-20 ENCOUNTER — Other Ambulatory Visit: Payer: Self-pay | Admitting: Hematology and Oncology

## 2020-08-23 ENCOUNTER — Other Ambulatory Visit: Payer: Self-pay | Admitting: *Deleted

## 2020-08-23 MED ORDER — OXYBUTYNIN CHLORIDE ER 10 MG PO TB24: 10.0000 mg | ORAL_TABLET | Freq: Every day | ORAL | 5 refills | Status: DC

## 2020-08-30 ENCOUNTER — Other Ambulatory Visit: Payer: Self-pay | Admitting: Internal Medicine

## 2020-08-30 ENCOUNTER — Other Ambulatory Visit: Payer: Self-pay | Admitting: Neurology

## 2020-09-01 ENCOUNTER — Other Ambulatory Visit: Payer: Self-pay | Admitting: *Deleted

## 2020-09-01 ENCOUNTER — Other Ambulatory Visit: Payer: Self-pay

## 2020-09-01 MED ORDER — METHYLPREDNISOLONE 4 MG PO TBPK: ORAL_TABLET | ORAL | 0 refills | Status: DC

## 2020-09-01 MED ORDER — METHOCARBAMOL 500 MG PO TABS: 500.0000 mg | ORAL_TABLET | Freq: Three times a day (TID) | ORAL | 0 refills | Status: DC

## 2020-09-01 MED ORDER — ANASTROZOLE 1 MG PO TABS
1.0000 mg | ORAL_TABLET | Freq: Every day | ORAL | 0 refills | Status: DC
Start: 2020-09-01 — End: 2020-11-08

## 2020-09-06 ENCOUNTER — Telehealth: Payer: Self-pay

## 2020-09-06 NOTE — Telephone Encounter (Signed)
RN spoke with patient, informed information from previous encounter with pharmacy recommendations.    Pt verbalized understanding - Pt voiced she is out of medication Anastrozole, however she is wanting to waiting until it can be filled by pharmacy with insurance not denying.  Per pharmacy, Dec 7th will be available for refill.   RN encouraged patient that there is no side effects related to missing dosage, safe to resume at same dose once medication is available.  Pt verbalized understanding, agreement, and appreciation.

## 2020-09-06 NOTE — Telephone Encounter (Signed)
RN received voicemail from patient inquiring about medication, Anastrozole 1mg  tablet daily.  Pt states this medication was refilled as requested to local pharmacy, however, patient is unable to get medication from pharmacy.   RN placed call to [facility].  Per pharmacy medication is being denied by insurance due to refill being too soon.  Per pharmacy if she has previously filled with mail order, this could contribute if they have already filled and mailed.  If patient wishes to self-pay the cost is 189 for 30 day supply.  Or pharmacy recommendations she can contact insurance company and consult with being out of medication due to delay in mail order pharmacy.   RN returned call to patient - No answer, no option to leave message.

## 2020-09-09 ENCOUNTER — Ambulatory Visit (INDEPENDENT_AMBULATORY_CARE_PROVIDER_SITE_OTHER): Payer: 59 | Admitting: Internal Medicine

## 2020-09-09 ENCOUNTER — Encounter: Payer: Self-pay | Admitting: Internal Medicine

## 2020-09-09 ENCOUNTER — Other Ambulatory Visit: Payer: Self-pay

## 2020-09-09 VITALS — BP 140/72 | HR 67 | Ht 62.0 in | Wt 153.2 lb

## 2020-09-09 DIAGNOSIS — Z79899 Other long term (current) drug therapy: Secondary | ICD-10-CM | POA: Diagnosis not present

## 2020-09-09 DIAGNOSIS — I10 Essential (primary) hypertension: Secondary | ICD-10-CM | POA: Diagnosis not present

## 2020-09-09 DIAGNOSIS — I2510 Atherosclerotic heart disease of native coronary artery without angina pectoris: Secondary | ICD-10-CM

## 2020-09-09 MED ORDER — HYDROCHLOROTHIAZIDE 25 MG PO TABS: 25.0000 mg | ORAL_TABLET | Freq: Every day | ORAL | 3 refills | Status: DC

## 2020-09-09 NOTE — Progress Notes (Signed)
09/09/2020   PCP: Patient, No Pcp Per  CC: Routine follow-up  HPI:  49 year old Female hx of hypertension, dyslipidemia, chronic tobacco use, chronic kidney disease stage III admitted with hypertensive urgency 09/13/14 and 6/10 chest pain EKG showed lateral T-wave inversions on admission and cardiology was consulted. It was felt she had unstable angina and she was placed on nitro drip.  Cardiac catheterization with 2.25X 12 mm Promus remainder DES to the M RCA on 09/14/14 for 90% stenosis. EF noted 65- 70%.  She did well and discharged without complication.    She was on numerous BP meds and her BP was running in the 90s to 106 and she felt very weak, lethargic.  The hydralazine was stopped and the cardizem.  She is feeling better.  She has occasional brief chest pressure feeling like the discomfort she was admitted with.  She is here today for follow-up. She is actually doing quite well her questions we discussed where the pneumonia vaccine she is only 45 and essentially in good health told her that was not needed unless she wanted it she does not want a flu vaccine she's had side effects in the past and refuses to take it. She is scheduled for sleep study January 29 2 [place]. She was recently seen by her primary care for cold symptoms and was placed on Zithromax.  She is feeling better.    She is asking about exercise which I recommended she could go back to to start slowly but work only a peripheral 1-2 weeks. She did not have a heart attack and her EF is normal.  Her mother was with her today who also has stents but the mother is or bare-metal stents. We discussed the difference between the 2 and the need for Plavix with both, and the length of time she would need to take the Plavix.  She is no longer smoking. She is using the NicoDerm.  I saw Ms. Behring back today in the office for follow-up. She denies any chest pain or worsening shortness of breath. She seems to be stable  from a cardiac standpoint after her stent. She does however have an acute complaint. She tells me that about 2 weeks ago she had a viral upper respiratory infection and had a lot of discharge and pus. She has sore throat associated with this. Subsequently she felt a pop on the right side of her head. After that she had pain and numbness on the right side of her face on her right shoulder and down to the right upper arm and right upper back. She's also had difficulty swallowing due to pain particularly in the right side of her throat as well as some mild drooling. She denies any facial droop, right sided weakness, loss of consciousness, change in vision or other associated symptoms. She denies any headache. Blood pressure has been well controlled and is at goal today at 122/76. She was seen in urgent care and treated for acute pansinusitis by Dr. Elder Cyphers and given amoxicillin which she completed. Lab showed no elevated white count, normal renal function, her cholesterol profile was well controlled, and she had a low ESR 15  (which would likely exclude temporal arteritis).  02/23/2017  Ms. Thien returns today for follow-up. Unfortunately she suffered a non-ST elevation MI in March 2018. She was found to have total occlusion of the RCA treated with drug-eluting stents. There was some mild nonobstructive disease of the LAD and circumflex and some mild  LV systolic dysfunction with EF 50-55%. She reports doing much better since discharge. She denies any recurrent chest pain but does get short of breath and some fatigue with exertion. She started cardiac rehabilitation but found that it worsened her chronic back pain and now she's discontinued that. She recently saw Ignacia Bayley, NP, in follow-up. She was felt to be doing well although reported having some tachycardia palpitations when awakening from sleep in the middle the night. He was concerned about arrhythmias and placed a monitor. Her monitor showed normal sinus  rhythm without any arrhythmias. She since discontinued that. She is on Brilinta but does not report that her shortness of breath seems to be related to the doses. I advised that she can consider some caffeine after the medication to see if it attenuates some of the symptoms.  05/16/2017  Mrs. Toppins returns today for follow-up. Recently she's noted that her blood pressures been running much higher although was well controlled in the hospital. She denies any chest pain although has had some mild shortness of breath and occasional sharp chest discomfort. Blood pressure is notably been elevated with systolics in the 350K and diastolics over 938H. Yesterday she was advised to take an additional lisinopril which brought her systolic blood pressure down to the 150s. Typically her blood pressures are elevated more in the evening per her report and in the morning. She was present on a diuretic was taken off of that however due to low blood pressure. Unfortunate, she was recently diagnosed with breast cancer and will need a surgery most likely in September. This will be delayed as she is on dual antiplatelet therapy given her recent stent in March 2018.   09/07/2017  Ms. Burr of returns today for follow-up.  She underwent surgery for breast cancer in September.  She was off of her Effient for that procedure and then restarted it.  Since then she has been undergoing radiation therapy.  She seems to be doing fairly well with this.  She denies any recurrent chest pain or worsening shortness of breath.  Lipid profile in September 2018 showed total cholesterol 120, triglycerides 70, HDL 37 and LDL 69.  12/21/2017  Arleigh returns today for follow-up of her coronary disease.  She underwent PCI in March 2018.  Is now been a year since then.  She is on dual antiplatelet therapy with aspirin and Effient.  She reports ongoing chest wall pain which is worse with breathing.  The pain is described as sharp and occurs on a daily  basis.  Is not necessarily worsened.  This is different pain than she had with her most recent PCI.  There is concern this may be related to chest wall radiation.  09/09/2020  Mr. Wilbourne returns today for follow-up.  Recently she was seen by Almyra Deforest, PA-C who had made some adjustments to her medications.  She had surgery and he had switched her to metoprolol, stopping her hydralazine and her amlodipine.  She seems to be tolerating this well however blood pressure is not optimally controlled.  She is notices more recently been around 140/70.  Currently she is on amlodipine 10 mg, lisinopril 20 mg twice daily and metoprolol.  She has not been on any diuretic.  Current Outpatient Medications  Medication Sig Dispense Refill  . acetaminophen (TYLENOL) 325 MG tablet Take 325-650 mg by mouth every 8 (eight) hours as needed (for pain or cramping).     Marland Kitchen amLODipine (NORVASC) 10 MG tablet TAKE 1 TABLET(10 MG)  BY MOUTH DAILY 90 tablet 2  . anastrozole (ARIMIDEX) 1 MG tablet Take 1 tablet (1 mg total) by mouth daily. 90 tablet 0  . aspirin 81 MG chewable tablet Chew 1 tablet (81 mg total) by mouth daily.    Marland Kitchen atorvastatin (LIPITOR) 80 MG tablet TAKE 1 TABLET(80 MG) BY MOUTH DAILY 90 tablet 1  . baclofen (LIORESAL) 10 MG tablet TAKE BY MOUTH UP TO 4 TABLETS EVERY DAY FOR MUSCLE SPASMS 120 tablet 5  . celecoxib (CELEBREX) 200 MG capsule TAKE 1 CAPSULE(200 MG) BY MOUTH TWICE DAILY 60 capsule 5  . ferrous sulfate 325 (65 FE) MG tablet Take 325 mg by mouth daily.    . fluconazole (DIFLUCAN) 150 MG tablet Take 150 mg by mouth once.    . fluticasone (FLONASE) 50 MCG/ACT nasal spray Place 1-2 sprays into both nostrils daily as needed for allergies or rhinitis.    . furosemide (LASIX) 20 MG tablet Take 1 tablet (20 mg total) by mouth daily as needed (fluid retention.). 90 tablet 0  . gabapentin (NEURONTIN) 300 MG capsule Take 630m (3 capsules) by mouth three times daily 270 capsule 3  . lisinopril (ZESTRIL) 20 MG  tablet TAKE 1 TABLET(20 MG) BY MOUTH TWICE DAILY 180 tablet 2  . methocarbamol (ROBAXIN) 500 MG tablet Take 1 tablet (500 mg total) by mouth 3 (three) times daily. 90 tablet 0  . metoprolol tartrate (LOPRESSOR) 25 MG tablet Take 1 tablet (25 mg total) by mouth 2 (two) times daily. 180 tablet 3  . nitroGLYCERIN (NITROSTAT) 0.4 MG SL tablet Place 1 tablet (0.4 mg total) under the tongue every 5 (five) minutes as needed for chest pain. 25 tablet 4  . OXcarbazepine (TRILEPTAL) 150 MG tablet Take 2 tablets (300 mg total) by mouth 2 (two) times daily. 120 tablet 5  . oxybutynin (DITROPAN XL) 10 MG 24 hr tablet Take 1 tablet (10 mg total) by mouth at bedtime. 30 tablet 5  . traMADol (ULTRAM) 50 MG tablet TAKE 1 TABLET(50 MG) BY MOUTH TWICE DAILY AS NEEDED 60 tablet 5  . venlafaxine XR (EFFEXOR-XR) 75 MG 24 hr capsule TAKE 1 CAPSULE(75 MG) BY MOUTH DAILY WITH BREAKFAST 90 capsule 0  . lamoTRIgine (LAMICTAL) 100 MG tablet Take 1 tablet (100 mg total) by mouth 2 (two) times daily. 60 tablet 11  . lamoTRIgine (LAMICTAL) 25 MG tablet 1 tab bid x one week 2 tab bid x 2nd week 3 tab bid x 3rd week 240 tablet 0   No current facility-administered medications for this visit.    Past Medical History:  Diagnosis Date  . Anemia   . Anxiety   . [place] full [place] ([place]   . CAD in native artery cardiologist--  dr Xara Paulding   a. 09/2014 Cath/PCI in [place] of [facility]- s/p 2.25 x 12 mm Promus Premier DES to mid RCA;  b. 11/2016 Myoview: EF 56%, no ischemia;  c. 12/2016 NSTEMI/PCI: LM nl, LAd 25p, LCX 30p, RCA 100p (2.75x38 Promus Premier DES), mRCA 10 ISR, EF 50-55%.  . CKD (chronic kidney disease), stage III ([place]   . Depression   . Genetic testing 04/23/2017   Ms. Ricke underwent genetic counseling and testing for hereditary cancer syndromes on 04/05/2017. Her results were negative for mutations in all 46 genes analyzed by [facility]'s 46-gene Common Hereditary Cancers Panel. Genes analyzed include: APC,  ATM, AXIN2, BARD1, BMPR1A, BRCA1, BRCA2, BRIP1, CDH1, CDKN2A, CHEK2, CTNNA1, DICER1, EPCAM, GREM1, HOXB13, KIT, MEN1, MLH1, MSH2, MSH3, MSH6,  MUTYH, NBN,  . GERD (gastroesophageal reflux disease)   . Headache   . History of external beam radiation therapy    right breast 07-27-2017  to 09-25-2017  . History of non-ST elevation myocardial infarction (NSTEMI)   . HOCM (hypertrophic obstructive cardiomyopathy) ([place]) followed by cardiology   a. 12/2016 Echo: EF 65-70%,  mod conc LVH, dynamic obstruction @ rest, peak velocity of 291 cm/sec w/ peak gradient of 22mHg, no rwma, Gr1 DD, triv TR, PASP 169mg.  . Marland Kitchenyperlipidemia   . Hypertension   . Malignant neoplasm of lower-outer quadrant of right breast of female, estrogen receptor positive ([facility] dr guLindi Adie dx 06/ 2018--- right breast invasive lobular carcinoma, ductal carcinoma w/ LCIS---- 06-21-2017 s/p right breast lumpecotmy w/ sln disseciton;   completed radiation 09-25-2017;  on tamoxifen  . Myocardial infarction ([place]   2017  . S/P drug eluting coronary stent placement    09-14-2014[facility]  PCI with DES x1 to proxRCA  . [facility] dr saFelecia Shelling 01/ 2017  dx transverse myelitis w/ right side numbness (09-01-2019  currently lower extremity weakness, mucsle spasms, and gait disturbance)  . Wears glasses     Past Surgical History:  Procedure Laterality Date  . BREAST LUMPECTOMY WITH RADIOACTIVE SEED AND SENTINEL LYMPH NODE BIOPSY Right 06/21/2017   Procedure: RIGHT BREAST BRACKETED SEED GUIDED LUMPECTOMY AND SENTINEL LYMPH NODE BIOPSY;  Surgeon: ToJovita KussmaulMD;  [place] [facility] Service: General;  Laterality: Right;  . CORONARY/GRAFT ACUTE MI REVASCULARIZATION N/A 12/31/2016   Procedure: Coronary/Graft Acute MI Revascularization;  Surgeon: MiSherren MochaMD;  [place];  Service: Cardiovascular;  Laterality: N/A;  . DILATATION &  CURETTAGE/HYSTEROSCOPY WITH MYOSURE N/A 09/02/2019   Procedure: DILATATION & CURETTAGE/HYSTEROSCOPY WITH MYOSURE;  Surgeon: FoAloha GellMD;  [place] [facility] Service: Gynecology;  Laterality: N/A;  . HERNIA REPAIR    . LEFT HEART CATH AND CORONARY ANGIOGRAPHY N/A 12/31/2016   Procedure: Left Heart Cath and Coronary Angiography;  Surgeon: MiSherren MochaMD;  [facility];  Service: Cardiovascular;  Laterality: N/A;  . LEFT HEART CATHETERIZATION WITH CORONARY ANGIOGRAM N/A 09/14/2014   Procedure: LEFT HEART CATHETERIZATION WITH CORONARY ANGIOGRAM;  Surgeon: ChBurnell BlanksMD;  [facility];  Service: Cardiovascular;  Laterality: N/A;  . MASS EXCISION N/A 03/30/2020   Procedure: EXCISION MASS RIGHT LABIA MINORA;  Surgeon: TuLafonda MossesMD;  Location: [facility];  Service: Gynecology;  Laterality: N/A;  . PERCUTANEOUS CORONARY STENT INTERVENTION (PCI-S)  09/14/2014   Procedure: PERCUTANEOUS CORONARY STENT INTERVENTION (PCI-S);  Surgeon: ChBurnell BlanksMD;  [facility];  Service: Cardiovascular;;  . ROBOTIC ASSISTED SALPINGO OOPHERECTOMY Bilateral 03/30/2020   Procedure: XI ROBOTIC ASSISTED SALPINGO OOPHORECTOMY;  Surgeon: TuLafonda MossesMD;  Location: [facility];  Service: Gynecology;  Laterality: Bilateral;  . TONSILLECTOMY  2005  . UMBILICAL HERNIA REPAIR  2001; 2005    ROS: Pertinent items noted in HPI and remainder of comprehensive ROS otherwise negative.  Wt Readings from Last 3 Encounters:  09/09/20 153 lb 3.2 oz (69.5 kg)  04/28/20 143 lb 9.6 oz (65.1 kg)  04/23/20 144 lb (65.3 kg)    PHYSICAL EXAM BP 140/72   Pulse 67   Ht '5\' 2"'  (1.575 m)   Wt 153 lb 3.2 oz (69.5 kg)   BMI 28.02 kg/m  General appearance: alert and  no distress Neck: no carotid bruit and no JVD Lungs: clear to auscultation bilaterally Heart: regular rate and rhythm, S1, S2 normal, no murmur, click, rub or gallop Abdomen: soft,  non-tender; bowel sounds normal; no masses,  no organomegaly Extremities: extremities normal, atraumatic, no cyanosis or edema Pulses: 2+ and symmetric Skin: Skin color, texture, turgor normal. No rashes or lesions Neurologic: Grossly normal Psych: pleasant  EKG:  Normal sinus rhythm at 67, nonspecific ST changes-personally reviewed  ASSESSMENT: 1. History of NSTEMI - s/p repeat PCI to the RCA 12/2016 - DES 2. Coronary artery disease status post PCI to the RCA in 2015 3. Dyslipidemia-at goal 4. Hypertension 5. DOE 6. Breast cancer   PLAN: Ms Nawabi had recent surgery and some medication adjustments.  She was taken off of her carvedilol because of some intolerance as well as hydralazine and placed on metoprolol however the net change is associated with an increase in blood pressure.  I recommend adding in hydrochlorothiazide because she is maxed out on several other medicines to see if we can further reduce her blood pressure.  She will need a repeat metabolic profile in about 1 to 2 weeks to assess for potassium and changes in renal function.  She should send [place] some numbers of blood pressures at home.  Follow-up with me annually or sooner as necessary.  Pixie Casino, MD, [facility], [place] Director of the Advanced Lipid Disorders &  Cardiovascular Risk Reduction Clinic Attending Cardiologist  Direct Dial: [phone]  Fax: [phone]  Website:  [email or web address]

## 2020-09-09 NOTE — Patient Instructions (Addendum)
Medication Instructions:  START hydrochlorothiazide 25mg  once daily CONTINUE all other current medications  *If you need a refill on your cardiac medications before your next appointment, please call your pharmacy*   Lab Work: BMET - 1 week after starting new medication  If you have labs (blood work) drawn today and your tests are completely normal, you will receive your results only by: Marland Kitchen MyChart Message (if you have MyChart) OR . A paper copy in the mail If you have any lab test that is abnormal or we need to change your treatment, we will call you to review the results.   Testing/Procedures: NONE   Follow-Up: At [facility], you and your health needs are our priority.  As part of our continuing mission to provide you with exceptional heart care, we have created designated Provider Care Teams.  These Care Teams include your primary Cardiologist (physician) and Advanced Practice Providers (APPs -  Physician Assistants and Nurse Practitioners) who all work together to provide you with the care you need, when you need it.  We recommend signing up for the patient portal called "MyChart".  Sign up information is provided on this After Visit Summary.  MyChart is used to connect with patients for Virtual Visits (Telemedicine).  Patients are able to view lab/test results, encounter notes, upcoming appointments, etc.  Non-urgent messages can be sent to your provider as well.   To learn more about what you can do with MyChart, go to [email or web address].    Your next appointment:   12 month(s)  The format for your next appointment:   In Person  Provider:   You may see Pixie Casino, MD or one of the following Advanced Practice Providers on your designated Care Team:    Almyra Deforest, PA-C  Fabian Sharp, PA-C or   Roby Lofts, [place]    Other Instructions Check BP readings at home - call or send MyChart message with readings in about 2 weeks

## 2020-09-13 ENCOUNTER — Other Ambulatory Visit: Payer: Self-pay | Admitting: Neurology

## 2020-09-17 ENCOUNTER — Other Ambulatory Visit: Payer: Self-pay | Admitting: Neurology

## 2020-09-20 ENCOUNTER — Encounter: Payer: Self-pay | Admitting: Neurology

## 2020-09-20 ENCOUNTER — Ambulatory Visit (INDEPENDENT_AMBULATORY_CARE_PROVIDER_SITE_OTHER): Payer: 59 | Admitting: Neurology

## 2020-09-20 ENCOUNTER — Other Ambulatory Visit: Payer: Self-pay | Admitting: Physician Assistant

## 2020-09-20 VITALS — BP 165/102 | HR 69 | Wt 149.0 lb

## 2020-09-20 DIAGNOSIS — G373 Acute transverse myelitis in demyelinating disease of central nervous system: Secondary | ICD-10-CM

## 2020-09-20 DIAGNOSIS — R29818 Other symptoms and signs involving the nervous system: Secondary | ICD-10-CM

## 2020-09-20 DIAGNOSIS — R269 Unspecified abnormalities of gait and mobility: Secondary | ICD-10-CM

## 2020-09-20 DIAGNOSIS — M4802 Spinal stenosis, cervical region: Secondary | ICD-10-CM

## 2020-09-20 DIAGNOSIS — R208 Other disturbances of skin sensation: Secondary | ICD-10-CM | POA: Diagnosis not present

## 2020-09-20 MED ORDER — GABAPENTIN 300 MG PO CAPS
ORAL_CAPSULE | ORAL | 5 refills | Status: DC
Start: 2020-09-20 — End: 2021-06-23

## 2020-09-20 MED ORDER — ETODOLAC 400 MG PO TABS: 400.0000 mg | ORAL_TABLET | Freq: Two times a day (BID) | ORAL | 11 refills | Status: DC

## 2020-09-20 MED ORDER — TRAMADOL HCL 50 MG PO TABS: ORAL_TABLET | ORAL | 5 refills | Status: DC

## 2020-09-20 MED ORDER — OXCARBAZEPINE 300 MG PO TABS
ORAL_TABLET | ORAL | 11 refills | Status: DC
Start: 2020-09-20 — End: 2021-04-12

## 2020-09-20 MED ORDER — METHYLPREDNISOLONE 4 MG PO TABS: ORAL_TABLET | ORAL | 0 refills | Status: DC

## 2020-09-20 NOTE — Progress Notes (Signed)
[facility]  PATIENT: Anna Henson DOB: 1971-03-23  REFERRING DOCTOR OR PCP:  Rikki Spearing _________________________________   HISTORICAL  CHIEF COMPLAINT:  Chief Complaint  Patient presents with  . Follow-up    RM 12, alone. Here for possible MS exacerbation. Having pain in neck, radiating down to ribcage.     HISTORY OF PRESENT ILLNESS:  Anna Henson is a 49 yo woman with a transverse myelitis in late January 2017.    Follow-Up 09/20/2020 For the past week, she has had more severe electric lie sensations radiating down her spine and into the ribs.   Medications have not helped..  Pain is worse when the neck is touched.  Pain is sometimes better with a heating pad.   She notes numbness is worse n her hands, left > right.  MRI 12/2019 showed resolution of the cervical spine lesion seen previously (we discussed that despite no lesion now on MRI a scar could still be left behind).    She takes gabapentin 900 mg po tid and tramadol 50 mg po bid, Celebrex 200 mg po bid, baclofen 10 mg 3-4 times a day.  These medications help tingling pain some. She feels worse than last year.    She had a breast biopsy that was reportedly ok - some calcification but no cancer.      She has just been exposed to Covid-19 (she was negative) but needs to work from home this week.     At the last visit, she felt her transverse myelitis symptoms were worse so repeat imaging was performed: December 24, 2019 MRI of the cervical spine is normal (MRI February 2017 has shown T2 hyperintensity from the medulla to the mid cervical spine) she does.  She does have degenerative changes.  There is moderate spinal stenosis at C5-C6 with moderate right foraminal narrowing.  At C6-C7, she has left-sided disc osteophyte complex that could affect the left C7 nerve root.  December 24, 2019 MRI of the thoracic spine showed a normal spinal cord and no significant degenerative changes.  Transverse myelitis  history: Transverse myelitis history:    In late January 2017,  she had a right sided headache and went to bed.  When she woke up, she noted numbness in the right neck, shoulder and arm.   Numbness worsened over the next fe days.   She went to her PCP an was told she might have sinusitis and Amoxicillin was prescribed.   After a couple more days, she saw another doctor and had a neck soft tissue CT scan.   That CT scan showed that she had an increase of adenoid tissue and reactive type lymph nodes.  By that time, numbness extended to the fingertips on the riht but not into her leg.   She was told to go to the emergency room for further evaluation. In the emergency room she had an MRI of the cervical spine and the brain. The MRI of the cervical spine showed a focus that extended from the lower medulla on the right down to C5 posteriorly and more medially within the spinal cord. She also had some enhancement around the level of the cervical medullary junction. She was admitted to the hospital for further treatment and testing.    She was treated with 5 days of IV Solu-Medrol. During that time, her symptoms of numbness improved.  She has not returned to baseline.    She had many tests performed including lumbar puncture for CSF analysis and  a thoracic spine MRI and serology blood tests. For the most part, the evaluation was negative.  The 02/02/16 cervical spine shows resolution of the prior enhancing focus that was seen at the cervicomedullary junction.    Data:    MRI  has a longitudinal lesion extending from the lower medulla on the right down to C5/C6 on MRI January 2017.  The enhancement does not extend up to the medulla.  Cervical spine also showed mild spinal stenosis at C3-C4 and mild to moderate spinal stenosis at C5-C6 and mild spinal stenosis at C6-C7. There is some foraminal narrowing at those levels but no definite nerve root compression.  Elsewhere the brain, there were some subcortical and deep white  matter T2/FLAIR hyperintense foci but not in a pattern that would be typical for MS. The thoracic spine was normal.    I also reviewed her many laboratory tests performed while she was at [employer]. Neuromyelitis optica antibodies are negative. She did not have oligoclonal bands. ANCA, ANA, SSA/SSB, ACE and Vit B-12 were all normal or negative.  She had Epstein-Barr IgG but not IgM. CSF cell counts did not show increase in white blood cells.  The 02/02/16 cervical spine MRI shows resolution of the prior enhancing focus that was seen at the cervicomedullary/upper cervical spine  .    December 24, 2019 MRI of the cervical spine is normal (MRI February 2017 has shown T2 hyperintensity from the medulla to the mid cervical spine) she does.  She does have degenerative changes.  There is moderate spinal stenosis at C5-C6 with moderate right foraminal narrowing.  At C6-C7, she has left-sided disc osteophyte complex that could affect the left C7 nerve root.  December 24, 2019 MRI of the thoracic spine showed a normal spinal cord and no significant degenerative changes.   REVIEW OF SYSTEMS: Constitutional: No fevers, chills, sweats, or change in appetite.  She notes some fatigue Eyes: No visual changes, double vision, eye pain Ear, nose and throat: No hearing loss, ear pain, nasal congestion, sore throat Cardiovascular: No chest pain, palpitations.   H/O angina, CAD with stent Respiratory: No shortness of breath at rest or with exertion.   No wheezes GastrointestinaI: No nausea, vomiting, diarrhea, abdominal pain, fecal incontinence Genitourinary: Mild urgency and frequency.  No nocturia. Musculoskeletal: No neck pain, back pain Integumentary: No rash, pruritus, skin lesions Neurological: as above Psychiatric: No depression at this time.  No anxiety Endocrine: No palpitations, diaphoresis, change in appetite, change in weigh or increased thirst Hematologic/Lymphatic: No anemia, purpura,  petechiae. Allergic/Immunologic: No itchy/runny eyes, nasal congestion, recent allergic reactions, rashes  ALLERGIES: Allergies  Allergen Reactions  . Pork-Derived Products Other (See Comments)    Does NOT eat pork    HOME MEDICATIONS:  Current Outpatient Medications:  .  acetaminophen (TYLENOL) 325 MG tablet, Take 325-650 mg by mouth every 8 (eight) hours as needed (for pain or cramping). , Disp: , Rfl:  .  amLODipine (NORVASC) 10 MG tablet, TAKE 1 TABLET(10 MG) BY MOUTH DAILY, Disp: 90 tablet, Rfl: 2 .  anastrozole (ARIMIDEX) 1 MG tablet, Take 1 tablet (1 mg total) by mouth daily., Disp: 90 tablet, Rfl: 0 .  aspirin 81 MG chewable tablet, Chew 1 tablet (81 mg total) by mouth daily., Disp: , Rfl:  .  atorvastatin (LIPITOR) 80 MG tablet, TAKE 1 TABLET(80 MG) BY MOUTH DAILY, Disp: 90 tablet, Rfl: 1 .  baclofen (LIORESAL) 10 MG tablet, TAKE BY MOUTH UP TO 4 TABLETS EVERY DAY FOR MUSCLE  SPASMS, Disp: 120 tablet, Rfl: 5 .  celecoxib (CELEBREX) 200 MG capsule, TAKE 1 CAPSULE(200 MG) BY MOUTH TWICE DAILY, Disp: 60 capsule, Rfl: 5 .  ferrous sulfate 325 (65 FE) MG tablet, Take 325 mg by mouth daily., Disp: , Rfl:  .  fluconazole (DIFLUCAN) 150 MG tablet, Take 150 mg by mouth once., Disp: , Rfl:  .  fluticasone (FLONASE) 50 MCG/ACT nasal spray, Place 1-2 sprays into both nostrils daily as needed for allergies or rhinitis., Disp: , Rfl:  .  furosemide (LASIX) 20 MG tablet, Take 1 tablet (20 mg total) by mouth daily as needed (fluid retention.)., Disp: 90 tablet, Rfl: 0 .  hydrochlorothiazide (HYDRODIURIL) 25 MG tablet, Take 1 tablet (25 mg total) by mouth daily., Disp: 90 tablet, Rfl: 3 .  lisinopril (ZESTRIL) 20 MG tablet, TAKE 1 TABLET(20 MG) BY MOUTH TWICE DAILY, Disp: 180 tablet, Rfl: 2 .  methocarbamol (ROBAXIN) 500 MG tablet, Take 1 tablet (500 mg total) by mouth 3 (three) times daily., Disp: 90 tablet, Rfl: 0 .  metoprolol tartrate (LOPRESSOR) 25 MG tablet, Take 1 tablet (25 mg total) by  mouth 2 (two) times daily., Disp: 180 tablet, Rfl: 3 .  nitroGLYCERIN (NITROSTAT) 0.4 MG SL tablet, Place 1 tablet (0.4 mg total) under the tongue every 5 (five) minutes as needed for chest pain., Disp: 25 tablet, Rfl: 4 .  oxybutynin (DITROPAN XL) 10 MG 24 hr tablet, Take 1 tablet (10 mg total) by mouth at bedtime., Disp: 30 tablet, Rfl: 5 .  venlafaxine XR (EFFEXOR-XR) 75 MG 24 hr capsule, TAKE 1 CAPSULE(75 MG) BY MOUTH DAILY WITH BREAKFAST, Disp: 90 capsule, Rfl: 0 .  etodolac (LODINE) 400 MG tablet, Take 1 tablet (400 mg total) by mouth 2 (two) times daily., Disp: 60 tablet, Rfl: 11 .  gabapentin (NEURONTIN) 300 MG capsule, Take 942m (3 capsules) by mouth three times daily, Disp: 270 capsule, Rfl: 5 .  methylPREDNISolone (MEDROL) 4 MG tablet, Taper from 6 pills po for one day to 1 pill po the last day over 6 days, Disp: 21 tablet, Rfl: 0 .  OXcarbazepine (TRILEPTAL) 300 MG tablet, Take one pill qAM and two pills po qHS, Disp: 90 tablet, Rfl: 11 .  traMADol (ULTRAM) 50 MG tablet, TAKE 1 TABLET(50 MG) BY MOUTH TWICE DAILY AS NEEDED, Disp: 60 tablet, Rfl: 5  PAST MEDICAL HISTORY: Past Medical History:  Diagnosis Date  . Anemia   . Anxiety   . Bipolar disorder in full remission ([place]   . CAD in native artery cardiologist--  dr hilty   a. 09/2014 Cath/PCI in setting of [place]- s/p 2.25 x 12 mm Promus Premier DES to mid RCA;  b. 11/2016 Myoview: EF 56%, no ischemia;  c. 12/2016 NSTEMI/PCI: LM nl, LAd 25p, LCX 30p, RCA 100p (2.75x38 Promus Premier DES), mRCA 10 ISR, EF 50-55%.  . CKD (chronic kidney disease), stage III ([place]   . Depression   . Genetic testing 04/23/2017   Ms. Dukeman underwent genetic counseling and testing for hereditary cancer syndromes on 04/05/2017. Her results were negative for mutations in all 46 genes analyzed by [facility]'s 46-gene Common Hereditary Cancers Panel. Genes analyzed include: APC, ATM, AXIN2, BARD1, BMPR1A, BRCA1, BRCA2, BRIP1, CDH1, CDKN2A, CHEK2, CTNNA1, DICER1,  EPCAM, GREM1, HOXB13, KIT, MEN1, MLH1, MSH2, MSH3, MSH6, MUTYH, NBN,  . GERD (gastroesophageal reflux disease)   . Headache   . History of external beam radiation therapy    right breast 07-27-2017  to 09-25-2017  . History of  non-ST elevation myocardial infarction (NSTEMI)   . HOCM (hypertrophic obstructive cardiomyopathy) ([place]) followed by cardiology   a. 12/2016 Echo: EF 65-70%,  mod conc LVH, dynamic obstruction @ rest, peak velocity of 291 cm/sec w/ peak gradient of 53mHg, no rwma, Gr1 DD, triv TR, PASP 147mg.  . Marland Kitchenyperlipidemia   . Hypertension   . Malignant neoplasm of lower-outer quadrant of right breast of female, estrogen receptor positive ([facility] dr gu[facility]indi Adie dx 06/ 2018--- right breast invasive lobular carcinoma, ductal carcinoma w/ LCIS---- 06-21-2017 s/p right breast lumpecotmy w/ sln disseciton;   completed radiation 09-25-2017;  on tamoxifen  . Myocardial infarction ([place]   2017  . S/P drug eluting coronary stent placement    09[facility]14-2014[facility]  PCI with DES x1 to proxRCA  . Trans[facility] dr saFelecia Shelling 01/ 2017  dx transverse myelitis w/ right side numbness (09-01-2019  currently lower extremity weakness, mucsle spasms, and gait disturbance)  . Wears glasses     PAST SURGICAL HISTORY: Past Surgical History:  Procedure Laterality Date  . BREAST LUMPECTOMY WITH RADIOACTIVE SEED AND SENTINEL LYMPH NODE BIOPSY Right 06/21/2017   Procedure: RIGHT BREAST BRACKETED SEED GUIDED LUMPECTOMY AND SENTINEL LYMPH NODE BIOPSY;  Surgeon: ToJovita KussmaulMD;  Location: [facility] Service: General;  Laterality: Right;  . CORONARY/GRAFT ACUTE MI REVASCULARIZATION N/A 12/31/2016   Procedure: Coronary/Graft Acute MI Revascularization;  Surgeon: MiSherren MochaMD;  Location: [facility];  Service: Cardiovascular;  Laterality: N/A;  . DILATATION & CURETTAGE/HYSTEROSCOPY WITH MYOSURE N/A 09/02/2019   Procedure: DILATATION  & CURETTAGE/HYSTEROSCOPY WITH MYOSURE;  Surgeon: FoAloha GellMD;  [place]: [facility] Service: Gynecology;  Laterality: N/A;  . HERNIA REPAIR    . LEFT HEART CATH AND CORONARY ANGIOGRAPHY N/A 12/31/2016   Procedure: Left Heart Cath and Coronary Angiography;  Surgeon: MiSherren MochaMD;  [facility];  Service: Cardiovascular;  Laterality: N/A;  . LEFT HEART CATHETERIZATION WITH CORONARY ANGIOGRAM N/A 09/14/2014   Procedure: LEFT HEART CATHETERIZATION WITH CORONARY ANGIOGRAM;  Surgeon: ChBurnell BlanksMD;  [facility];  Service: Cardiovascular;  Laterality: N/A;  . MASS EXCISION N/A 03/30/2020   Procedure: EXCISION MASS RIGHT LABIA MINORA;  Surgeon: TuLafonda MossesMD;  Location: [facility];  Service: Gynecology;  Laterality: N/A;  . PERCUTANEOUS CORONARY STENT INTERVENTION (PCI-S)  09/14/2014   Procedure: PERCUTANEOUS CORONARY STENT INTERVENTION (PCI-S);  Surgeon: ChBurnell BlanksMD;  [facility];  Service: Cardiovascular;;  . ROBOTIC ASSISTED SALPINGO OOPHERECTOMY Bilateral 03/30/2020   Procedure: XI ROBOTIC ASSISTED SALPINGO OOPHORECTOMY;  Surgeon: TuLafonda MossesMD;  Location: [facility];  Service: Gynecology;  Laterality: Bilateral;  . TONSILLECTOMY  2005  . UMBILICAL HERNIA REPAIR  2001; 2005    FAMILY HISTORY: Family History  Problem Relation Age of Onset  . Diabetes Mother   . Heart disease Mother   . Hyperlipidemia Mother   . Hypertension Mother   . Breast cancer Mother   . Lung cancer Father   . Drug abuse Father   . Brain cancer Father [id number]. Bone cancer Father [id number]. Drug abuse Sister   . Mental illness Brother   . Mental illness Maternal Grandmother   . Stomach cancer Paternal Grandmother [id number]     d.75      DIAGNOSTIC DATA (LABS, IMAGING, TESTING) - I reviewed patient records, labs,  notes, testing and imaging myself where available.  Lab Results  Component Value Date   WBC 8.4 03/23/2020    HGB 11.5 (L) 03/23/2020   HCT 34.8 (L) 03/23/2020   MCV 92.6 03/23/2020   PLT 304 03/23/2020   +__________________________________________________________   Today's Vitals   09/20/20 1252  BP: (!) 165/102  Pulse: 69  Weight: 149 lb (67.6 kg)   Body mass index is 27.25 kg/m.  General: The patient is well-developed and well-nourished and in no acute distress  Neurologic Exam  Mental status: The patient is alert and oriented x 3 at the time of the examination. The patient has apparent normal recent and remote memory, with an apparently normal attention span and concentration ability.   Speech is normal.  Cranial nerves: Extraocular movements are full.   Facial strength and sensation is normal. Trapezius strength is normal    No obvious hearing deficits are noted.  Motor:  Muscle bulk is normal.  Tone is increased in legs, right greater than left. Strength is 5/5.  Sensory: Touch and vibration are symmetric in arms but reduced left leg vibration sensation..     Gait and station: Station is normal.   Gait is spastic (right) and mildly wide. Tandem gait is mildly wide.    Reflexes: Deep tendon reflexes are symmetric and increased in legs, right greater than left. There is spread at the knees.   No ankle clonus today.   ________________________________________________________   Transverse myelitis (HCC)  Lhermitte's sign positive  Dysesthesia  Cervical spinal stenosis  Gait disturbance    1.    Increase oxcarbazepine.  Continue gabapentin, baclofen and tramadol.   2.     She has C5C6 and C6C7 DJD as well.   Steroid pack.  Change Celebrex to etodoal 3.   Stay active and exercise. 4.   rtc 6 months, sooner if new or worsening issues     Hulda Reddix A. Felecia Shelling, MD, PhD 77/82/4235, 3:61 PM Certified in Neurology, Clinical Neurophysiology, Sleep Medicine, Pain Medicine and Neuroimaging  [facility] [address] [place], [place]  [address] [phone]

## 2020-09-30 ENCOUNTER — Other Ambulatory Visit: Payer: Self-pay | Admitting: Neurology

## 2020-10-07 ENCOUNTER — Telehealth: Payer: Self-pay | Admitting: Internal Medicine

## 2020-10-07 NOTE — Telephone Encounter (Signed)
See my chart message

## 2020-10-07 NOTE — Telephone Encounter (Signed)
Pt c/o BP issue: STAT if pt c/o blurred vision, one-sided weakness or slurred speech  1. What are your last 5 BP readings?  156/95 182/111 138/70- after taking Nitro 164/104 160/92 142/106 184/114 224/121 197/124 122/84 140/88 120/83   2. Are you having any other symptoms (ex. Dizziness, headache, blurred vision, passed out)? Headache last night but has since resolved   3. What is your BP issue? Higher readings started over the past two and a half days. Patient said she did eat some chinese food. She said she has eaten other foods in the past and it has gone up but this is the first time [place] food did this to her.  Patient wanted to know if there is anything she can take to bring it down.  Patient uses [id number] - [place], [place] - [address] if there is an rx Dr. Rennis Golden would want to send in for her   Patient also stated that another provider adjusted some medications but is not sure if that may have caused it either

## 2020-10-08 ENCOUNTER — Observation Stay (HOSPITAL_COMMUNITY)
Admission: EM | Admit: 2020-10-08 | Discharge: 2020-10-09 | Disposition: A | Discharge status: Home / Self Care | Payer: 59 | Attending: Internal Medicine | Admitting: Internal Medicine

## 2020-10-08 ENCOUNTER — Encounter (HOSPITAL_COMMUNITY): Payer: Self-pay | Admitting: Family Medicine

## 2020-10-08 ENCOUNTER — Emergency Department (HOSPITAL_COMMUNITY): Payer: 59

## 2020-10-08 ENCOUNTER — Other Ambulatory Visit: Payer: Self-pay

## 2020-10-08 DIAGNOSIS — R0789 Other chest pain: Secondary | ICD-10-CM | POA: Diagnosis not present

## 2020-10-08 DIAGNOSIS — Z79899 Other long term (current) drug therapy: Secondary | ICD-10-CM | POA: Insufficient documentation

## 2020-10-08 DIAGNOSIS — I5032 Chronic diastolic (congestive) heart failure: Secondary | ICD-10-CM | POA: Diagnosis not present

## 2020-10-08 DIAGNOSIS — R079 Chest pain, unspecified: Secondary | ICD-10-CM | POA: Diagnosis present

## 2020-10-08 DIAGNOSIS — F332 Major depressive disorder, recurrent severe without psychotic features: Secondary | ICD-10-CM | POA: Diagnosis present

## 2020-10-08 DIAGNOSIS — G373 Acute transverse myelitis in demyelinating disease of central nervous system: Secondary | ICD-10-CM | POA: Diagnosis present

## 2020-10-08 DIAGNOSIS — Z7982 Long term (current) use of aspirin: Secondary | ICD-10-CM | POA: Insufficient documentation

## 2020-10-08 DIAGNOSIS — I169 Hypertensive crisis, unspecified: Secondary | ICD-10-CM | POA: Diagnosis present

## 2020-10-08 DIAGNOSIS — Z87891 Personal history of nicotine dependence: Secondary | ICD-10-CM | POA: Diagnosis not present

## 2020-10-08 DIAGNOSIS — Z9861 Coronary angioplasty status: Secondary | ICD-10-CM | POA: Insufficient documentation

## 2020-10-08 DIAGNOSIS — Z853 Personal history of malignant neoplasm of breast: Secondary | ICD-10-CM | POA: Insufficient documentation

## 2020-10-08 DIAGNOSIS — M4802 Spinal stenosis, cervical region: Secondary | ICD-10-CM | POA: Diagnosis present

## 2020-10-08 DIAGNOSIS — C50511 Malignant neoplasm of lower-outer quadrant of right female breast: Secondary | ICD-10-CM

## 2020-10-08 DIAGNOSIS — E876 Hypokalemia: Secondary | ICD-10-CM | POA: Diagnosis not present

## 2020-10-08 DIAGNOSIS — N183 Chronic kidney disease, stage 3 unspecified: Secondary | ICD-10-CM | POA: Insufficient documentation

## 2020-10-08 DIAGNOSIS — I2510 Atherosclerotic heart disease of native coronary artery without angina pectoris: Secondary | ICD-10-CM | POA: Insufficient documentation

## 2020-10-08 DIAGNOSIS — Z170 Estrogen receptor positive status [ER+]: Secondary | ICD-10-CM

## 2020-10-08 DIAGNOSIS — I130 Hypertensive heart and chronic kidney disease with heart failure and stage 1 through stage 4 chronic kidney disease, or unspecified chronic kidney disease: Secondary | ICD-10-CM | POA: Insufficient documentation

## 2020-10-08 DIAGNOSIS — Z20822 Contact with and (suspected) exposure to covid-19: Secondary | ICD-10-CM | POA: Insufficient documentation

## 2020-10-08 LAB — TROPONIN I (HIGH SENSITIVITY)
Troponin I (High Sensitivity): 13 ng/L (ref <18)
Troponin I (High Sensitivity): 20 ng/L — ABNORMAL HIGH (ref <18)
Troponin I (High Sensitivity): 25 ng/L — ABNORMAL HIGH (ref <18)

## 2020-10-08 LAB — BASIC METABOLIC PANEL
Anion gap: 17 — ABNORMAL HIGH (ref 5–15)
BUN: 11 mg/dL (ref 6–20)
CO2: 19 mmol/L — ABNORMAL LOW (ref 22–32)
Calcium: 10.3 mg/dL (ref 8.9–10.3)
Chloride: 98 mmol/L (ref 98–111)
Creatinine, Ser: 0.95 mg/dL (ref 0.44–1.00)
GFR, Estimated: >60 mL/min (ref >60)
Glucose, Bld: 101 mg/dL — ABNORMAL HIGH (ref 70–99)
Potassium: 2.9 mmol/L — ABNORMAL LOW (ref 3.5–5.1)
Sodium: 134 mmol/L — ABNORMAL LOW (ref 135–145)

## 2020-10-08 LAB — CBC
HCT: 44.1 % (ref 36.0–46.0)
Hemoglobin: 16 g/dL — ABNORMAL HIGH (ref 12.0–15.0)
MCH: 32.6 pg (ref 26.0–34.0)
MCHC: 36.3 g/dL — ABNORMAL HIGH (ref 30.0–36.0)
MCV: 89.8 fL (ref 80.0–100.0)
Platelets: 409 10*3/uL — ABNORMAL HIGH (ref 150–400)
RBC: 4.91 MIL/uL (ref 3.87–5.11)
RDW: 14.2 % (ref 11.5–15.5)
WBC: 11.8 10*3/uL — ABNORMAL HIGH (ref 4.0–10.5)
nRBC: 0 % (ref 0.0–0.2)

## 2020-10-08 LAB — RESP PANEL BY RT-PCR (FLU A&B, COVID) ARPGX2
Influenza A by PCR: NEGATIVE
Influenza B by PCR: NEGATIVE
SARS Coronavirus 2 by RT PCR: NEGATIVE

## 2020-10-08 LAB — I-STAT BETA HCG BLOOD, ED (MC, WL, AP ONLY): I-stat hCG, quantitative: <5 m[IU]/mL (ref <5)

## 2020-10-08 LAB — LIPASE, BLOOD: Lipase: 24 U/L (ref 11–51)

## 2020-10-08 MED ORDER — AMLODIPINE BESYLATE 10 MG PO TABS
10.0000 mg | ORAL_TABLET | Freq: Every day | ORAL | Status: DC
  Administered 2020-10-09: 10 mg via ORAL
  Filled 2020-10-08: qty 1

## 2020-10-08 MED ORDER — SODIUM CHLORIDE 0.9 % IV BOLUS
500.0000 mL | Freq: Once | INTRAVENOUS | Status: AC
  Administered 2020-10-08: 500 mL via INTRAVENOUS

## 2020-10-08 MED ORDER — METOPROLOL TARTRATE 5 MG/5ML IV SOLN
5.0000 mg | Freq: Once | INTRAVENOUS | Status: AC
  Administered 2020-10-08: 5 mg via INTRAVENOUS
  Filled 2020-10-08: qty 5

## 2020-10-08 MED ORDER — OXCARBAZEPINE 150 MG PO TABS
300.0000 mg | ORAL_TABLET | Freq: Every day | ORAL | Status: DC
  Administered 2020-10-09: 300 mg via ORAL
  Filled 2020-10-08: qty 2

## 2020-10-08 MED ORDER — METOPROLOL TARTRATE 25 MG PO TABS
25.0000 mg | ORAL_TABLET | Freq: Two times a day (BID) | ORAL | Status: DC
  Administered 2020-10-09 (×2): 25 mg via ORAL
  Filled 2020-10-08 (×2): qty 1

## 2020-10-08 MED ORDER — TRAMADOL HCL 50 MG PO TABS: 50.0000 mg | ORAL_TABLET | Freq: Two times a day (BID) | ORAL | Status: DC | PRN

## 2020-10-08 MED ORDER — ENOXAPARIN SODIUM 40 MG/0.4ML ~~LOC~~ SOLN
40.0000 mg | Freq: Every day | SUBCUTANEOUS | Status: DC
  Administered 2020-10-09: 40 mg via SUBCUTANEOUS
  Filled 2020-10-08: qty 0.4

## 2020-10-08 MED ORDER — HYDROCHLOROTHIAZIDE 25 MG PO TABS
25.0000 mg | ORAL_TABLET | Freq: Every day | ORAL | Status: DC
  Administered 2020-10-09: 25 mg via ORAL
  Filled 2020-10-08: qty 1

## 2020-10-08 MED ORDER — ATORVASTATIN CALCIUM 80 MG PO TABS
80.0000 mg | ORAL_TABLET | Freq: Every day | ORAL | Status: DC
  Administered 2020-10-09: 80 mg via ORAL
  Filled 2020-10-08: qty 1

## 2020-10-08 MED ORDER — OXYBUTYNIN CHLORIDE ER 10 MG PO TB24
10.0000 mg | ORAL_TABLET | Freq: Every day | ORAL | Status: DC
  Administered 2020-10-09: 10 mg via ORAL
  Filled 2020-10-08 (×3): qty 1

## 2020-10-08 MED ORDER — POTASSIUM CHLORIDE CRYS ER 20 MEQ PO TBCR
40.0000 meq | EXTENDED_RELEASE_TABLET | Freq: Once | ORAL | Status: AC
  Administered 2020-10-08: 40 meq via ORAL
  Filled 2020-10-08: qty 2

## 2020-10-08 MED ORDER — LISINOPRIL 20 MG PO TABS: 20.0000 mg | ORAL_TABLET | Freq: Every day | ORAL | Status: DC

## 2020-10-08 MED ORDER — NITROGLYCERIN 0.4 MG SL SUBL
0.4000 mg | SUBLINGUAL_TABLET | SUBLINGUAL | Status: DC | PRN
  Administered 2020-10-09: 0.4 mg via SUBLINGUAL
  Filled 2020-10-08: qty 1

## 2020-10-08 MED ORDER — ACETAMINOPHEN 325 MG PO TABS
650.0000 mg | ORAL_TABLET | ORAL | Status: DC | PRN
  Administered 2020-10-09: 650 mg via ORAL
  Filled 2020-10-08: qty 2

## 2020-10-08 MED ORDER — ASPIRIN 81 MG PO CHEW
81.0000 mg | CHEWABLE_TABLET | Freq: Every day | ORAL | Status: DC
  Administered 2020-10-09: 81 mg via ORAL
  Filled 2020-10-08: qty 1

## 2020-10-08 MED ORDER — BACLOFEN 10 MG PO TABS: 10.0000 mg | ORAL_TABLET | Freq: Four times a day (QID) | ORAL | Status: DC | PRN

## 2020-10-08 MED ORDER — ANASTROZOLE 1 MG PO TABS
1.0000 mg | ORAL_TABLET | Freq: Every day | ORAL | Status: DC
  Administered 2020-10-09: 1 mg via ORAL
  Filled 2020-10-08: qty 1

## 2020-10-08 MED ORDER — VENLAFAXINE HCL ER 75 MG PO CP24
75.0000 mg | ORAL_CAPSULE | Freq: Every day | ORAL | Status: DC
  Administered 2020-10-09: 75 mg via ORAL
  Filled 2020-10-08: qty 1

## 2020-10-08 MED ORDER — ONDANSETRON HCL 4 MG/2ML IJ SOLN: 4.0000 mg | Freq: Four times a day (QID) | INTRAMUSCULAR | Status: DC | PRN

## 2020-10-08 MED ORDER — OXCARBAZEPINE 300 MG PO TABS: 300.0000 mg | ORAL_TABLET | ORAL | Status: DC

## 2020-10-08 MED ORDER — METHOCARBAMOL 500 MG PO TABS
500.0000 mg | ORAL_TABLET | Freq: Three times a day (TID) | ORAL | Status: DC
  Administered 2020-10-09 (×3): 500 mg via ORAL
  Filled 2020-10-08 (×3): qty 1

## 2020-10-08 MED ORDER — HYDRALAZINE HCL 50 MG PO TABS: 50.0000 mg | ORAL_TABLET | Freq: Four times a day (QID) | ORAL | Status: DC | PRN

## 2020-10-08 MED ORDER — ONDANSETRON HCL 4 MG/2ML IJ SOLN
4.0000 mg | Freq: Once | INTRAMUSCULAR | Status: AC
  Administered 2020-10-08: 4 mg via INTRAVENOUS
  Filled 2020-10-08: qty 2

## 2020-10-08 MED ORDER — POTASSIUM CHLORIDE 10 MEQ/100ML IV SOLN
10.0000 meq | Freq: Once | INTRAVENOUS | Status: AC
  Administered 2020-10-09: 10 meq via INTRAVENOUS
  Filled 2020-10-08 (×2): qty 100

## 2020-10-08 MED ORDER — OXCARBAZEPINE 150 MG PO TABS
600.0000 mg | ORAL_TABLET | Freq: Every day | ORAL | Status: DC
  Administered 2020-10-09: 600 mg via ORAL
  Filled 2020-10-08: qty 4

## 2020-10-08 MED ORDER — GABAPENTIN 300 MG PO CAPS
600.0000 mg | ORAL_CAPSULE | Freq: Three times a day (TID) | ORAL | Status: DC
  Administered 2020-10-09 (×3): 600 mg via ORAL
  Filled 2020-10-08 (×3): qty 2

## 2020-10-08 MED ORDER — MAGNESIUM OXIDE 400 (241.3 MG) MG PO TABS
800.0000 mg | ORAL_TABLET | Freq: Once | ORAL | Status: AC
  Administered 2020-10-08: 800 mg via ORAL
  Filled 2020-10-08: qty 2

## 2020-10-08 NOTE — ED Notes (Signed)
This RN attempted several times to insert a PIV with no success. °

## 2020-10-08 NOTE — H&P (Incomplete)
History and Physical    Anna Henson MIW:[id number] DOB: 1971-04-10 DOA: 10/08/2020  PCP: Patient, No Pcp Per   Patient coming from: Home   Chief Complaint: Chest pain   HPI: Anna Henson is a 49 y.o. female with medical history significant for cervical spinal stenosis, history of transverse myelitis, neuropathy, chronic pain, coronary artery disease with stent in 2018, hypertension, and depression, now presenting to the emergency department with chest pain.***   ED Course: Upon arrival to the ED, patient is found to be afebrile, saturating well on room air, and hypertensive to 205/87.  EKG features a sinus rhythm and chest x-rays negative for acute cardiopulmonary disease.  Chemistry panel notable for potassium of 2.9, bicarbonate 19, and anion gap 17.  CBC features a mild leukocytosis and thrombocytosis.  High-sensitivity troponin was 13 and 20 and 25.  Patient was treated with Zofran, IV Lopressor, saline, oral potassium, and oral magnesium in the ED.  Review of Systems:  All other systems reviewed and apart from HPI, are negative.  Past Medical History:  Diagnosis Date  . Anemia   . Anxiety   . Bipolar disorder in full remission ([place])   . CAD in native artery cardiologist--  dr hilty   a. 09/2014 Cath/PCI in setting of [place] - s/p 2.25 x 12 mm Promus Premier DES to mid RCA;  b. 11/2016 Myoview: EF 56%, no ischemia;  c. 12/2016 NSTEMI/PCI: LM nl, LAd 25p, LCX 30p, RCA 100p (2.75x38 Promus Premier DES), mRCA 10 ISR, EF 50-55%.  . CKD (chronic kidney disease), stage III ([place])   . Depression   . Genetic testing 04/23/2017   Ms. Nazari underwent genetic counseling and testing for hereditary cancer syndromes on 04/05/2017. Her results were negative for mutations in all 46 genes analyzed by [facility]'s 46-gene Common Hereditary Cancers Panel. Genes analyzed include: APC, ATM, AXIN2, BARD1, BMPR1A, BRCA1, BRCA2, BRIP1, CDH1, CDKN2A, CHEK2, CTNNA1, DICER1, EPCAM, GREM1, HOXB13, KIT, MEN1, MLH1,  MSH2, MSH3, MSH6, MUTYH, NBN,  . GERD (gastroesophageal reflux disease)   . Headache   . History of external beam radiation therapy    right breast 07-27-2017  to 09-25-2017  . History of non-ST elevation myocardial infarction (NSTEMI)   . HOCM (hypertrophic obstructive cardiomyopathy) ([place]) followed by cardiology   a. 12/2016 Echo: EF 65-70%,  mod conc LVH, dynamic obstruction @ rest, peak velocity of 291 cm/sec w/ peak gradient of 19mHg, no rwma, Gr1 DD, triv TR, PASP 127mg.  . Marland Kitchenyperlipidemia   . Hypertension   . Malignant neoplasm of lower-outer quadrant of right breast of female, [facility] gu[facility]indi Adie dx 06/ 2018--- right breast invasive lobular carcinoma, ductal carcinoma w/ LCIS---- 06-21-2017 s/p right breast lumpecotmy w/ sln disseciton;   completed radiation 09-25-2017;  on tamoxifen  . [facility] infarction ([facility]   2017  . S/P drug eluting coronary stent placement    [facility]14[facility]2014[facility]  PCI with DES x1 to proxRCA  . [facility] dr saFelecia Shelling 01/ 2017  dx transverse myelitis w/ right side numbness (09-01-2019  currently lower extremity weakness, mucsle spasms, and gait disturbance)  . Wears glasses     Past Surgical History:  Procedure Laterality Date  . BREAST LUMPECTOMY WITH RADIOACTIVE SEED AND SENTINEL LYMPH NODE BIOPSY Right 06/21/2017   Procedure: RIGHT BREAST BRACKETED SEED GUIDED LUMPECTOMY AND SENTINEL LYMPH NODE BIOPSY;  Surgeon: ToJovita KussmaulMD;  [place]  Service: General;  Laterality: Right;  . CORONARY/GRAFT ACUTE MI REVASCULARIZATION N/A 12/31/2016   Procedure: Coronary/Graft Acute MI Revascularization;  Surgeon: Sherren Mocha, MD;  Location: [place] CV LAB;  Service: Cardiovascular;  Laterality: N/A;  . DILATATION & CURETTAGE/HYSTEROSCOPY WITH MYOSURE N/A 09/02/2019   Procedure: DILATATION & CURETTAGE/HYSTEROSCOPY WITH MYOSURE;  Surgeon: Aloha Gell, MD;  Location: [place];  Service: Gynecology;  Laterality: N/A;  . HERNIA REPAIR    . LEFT HEART CATH AND CORONARY ANGIOGRAPHY N/A 12/31/2016   Procedure: Left Heart Cath and Coronary Angiography;  Surgeon: Sherren Mocha, MD;  Location: [place] CV LAB;  Service: Cardiovascular;  Laterality: N/A;  . LEFT HEART CATHETERIZATION WITH CORONARY ANGIOGRAM N/A 09/14/2014   Procedure: LEFT HEART CATHETERIZATION WITH CORONARY ANGIOGRAM;  Surgeon: Burnell Blanks, MD;  Location: [facility] CATH LAB;  Service: Cardiovascular;  Laterality: N/A;  . MASS EXCISION N/A 03/30/2020   Procedure: EXCISION MASS RIGHT LABIA MINORA;  Surgeon: Lafonda Mosses, MD;  Location: [facility];  Service: Gynecology;  Laterality: N/A;  . PERCUTANEOUS CORONARY STENT INTERVENTION (PCI-S)  09/14/2014   Procedure: PERCUTANEOUS CORONARY STENT INTERVENTION (PCI-S);  Surgeon: Burnell Blanks, MD;  Location: [facility] CATH LAB;  Service: Cardiovascular;;  . ROBOTIC ASSISTED SALPINGO OOPHERECTOMY Bilateral 03/30/2020   Procedure: XI ROBOTIC ASSISTED SALPINGO OOPHORECTOMY;  Surgeon: Lafonda Mosses, MD;  Location: [facility];  Service: Gynecology;  Laterality: Bilateral;  . TONSILLECTOMY  2005  . UMBILICAL HERNIA REPAIR  2001; 2005    Social History:   reports that she quit smoking about 5 years ago. Her smoking use included cigarettes. She has a 15.00 pack-year smoking history. She has never used smokeless tobacco. She reports current alcohol use of about 2.0 standard drinks of alcohol per week. She reports that she does not use drugs.  Allergies  Allergen Reactions  . Pork-Derived Products Other (See Comments)    Does NOT eat pork    Family History  Problem Relation Age of Onset  . Diabetes Mother   . Heart disease Mother   . Hyperlipidemia Mother   . Hypertension Mother   . Breast cancer Mother   . Lung cancer Father   . Drug abuse Father   . Brain cancer Father 89  . Bone cancer Father 59  .  Drug abuse Sister   . Mental illness Brother   . Mental illness Maternal Grandmother   . Stomach cancer Paternal Grandmother 20       d.75     Prior to Admission medications   Medication Sig Start Date End Date Taking? Authorizing Provider  acetaminophen (TYLENOL) 325 MG tablet Take 325-650 mg by mouth every 8 (eight) hours as needed for moderate pain.   Yes [provider]  amLODipine (NORVASC) 10 MG tablet TAKE 1 TABLET(10 MG) BY MOUTH DAILY Patient taking differently: Take 10 mg by mouth daily. 09/23/20  Yes Hilty, Nadean Corwin, MD  anastrozole (ARIMIDEX) 1 MG tablet Take 1 tablet (1 mg total) by mouth daily. 09/01/20  Yes Nicholas Lose, MD  aspirin 81 MG chewable tablet Chew 1 tablet (81 mg total) by mouth daily. 09/16/14  Yes Nita Sells, MD  atorvastatin (LIPITOR) 80 MG tablet TAKE 1 TABLET(80 MG) BY MOUTH DAILY Patient taking differently: Take 80 mg by mouth daily. 07/01/20  Yes Hilty, Nadean Corwin, MD  baclofen (LIORESAL) 10 MG tablet TAKE BY MOUTH UP TO 4 TABLETS EVERY DAY FOR MUSCLE SPASMS Patient taking differently: Take 10 mg by mouth 4 (four)  times daily as needed for muscle spasms. 09/13/20  Yes Sater, Nanine Means, MD  etodolac (LODINE) 400 MG tablet Take 1 tablet (400 mg total) by mouth 2 (two) times daily. 09/20/20  Yes Sater, Nanine Means, MD  ferrous sulfate 325 (65 FE) MG tablet Take 325 mg by mouth daily.   Yes [provider]  fluticasone (FLONASE) 50 MCG/ACT nasal spray Place 1-2 sprays into both nostrils daily as needed for allergies or rhinitis.   Yes [provider]  furosemide (LASIX) 20 MG tablet Take 1 tablet (20 mg total) by mouth daily as needed (fluid retention.). Patient taking differently: Take 20 mg by mouth daily as needed for fluid (fluid retention.). 08/30/20  Yes Hilty, Nadean Corwin, MD  gabapentin (NEURONTIN) 300 MG capsule Take 915m (3 capsules) by mouth three times daily Patient taking differently: Take 900 mg by mouth 3 (three)  times daily. 09/20/20  Yes Sater, RNanine Means MD  hydrochlorothiazide (HYDRODIURIL) 25 MG tablet Take 1 tablet (25 mg total) by mouth daily. 09/09/20 12/08/20 Yes Hilty, KNadean Corwin MD  lisinopril (ZESTRIL) 20 MG tablet TAKE 1 TABLET(20 MG) BY MOUTH TWICE DAILY Patient taking differently: Take 20 mg by mouth daily. 09/23/20  Yes Hilty, KNadean Corwin MD  methocarbamol (ROBAXIN) 500 MG tablet TAKE 1 TABLET(500 MG) BY MOUTH THREE TIMES DAILY Patient taking differently: Take 500 mg by mouth 3 (three) times daily. 09/30/20  Yes Sater, RNanine Means MD  metoprolol tartrate (LOPRESSOR) 25 MG tablet Take 1 tablet (25 mg total) by mouth 2 (two) times daily. 02/09/20  Yes MAlmyra Deforest PA  nitroGLYCERIN (NITROSTAT) 0.4 MG SL tablet Place 1 tablet (0.4 mg total) under the tongue every 5 (five) minutes as needed for chest pain. 04/19/20  Yes Hilty, KNadean Corwin MD  OXcarbazepine (TRILEPTAL) 300 MG tablet Take one pill qAM and two pills po qHS Patient taking differently: Take 300-600 mg by mouth See admin instructions. Take one pill in the morning and two pills at bedtime 09/20/20  Yes Sater, RNanine Means MD  oxybutynin (DITROPAN XL) 10 MG 24 hr tablet Take 1 tablet (10 mg total) by mouth at bedtime. 08/23/20  Yes Sater, RNanine Means MD  traMADol (ULTRAM) 50 MG tablet TAKE 1 TABLET(50 MG) BY MOUTH TWICE DAILY AS NEEDED Patient taking differently: Take 50 mg by mouth every 12 (twelve) hours as needed for moderate pain. 09/20/20  Yes Sater, RNanine Means MD  venlafaxine XR (EFFEXOR-XR) 75 MG 24 hr capsule TAKE 1 CAPSULE(75 MG) BY MOUTH DAILY WITH BREAKFAST Patient taking differently: Take 75 mg by mouth daily. 08/20/20  Yes GNicholas Lose MD  celecoxib (CELEBREX) 200 MG capsule TAKE 1 CAPSULE(200 MG) BY MOUTH TWICE DAILY Patient not taking: No sig reported 07/08/20   SBritt Bottom MD  methylPREDNISolone (MEDROL) 4 MG tablet Taper from 6 pills po for one day to 1 pill po the last day over 6 days Patient not taking: No sig reported  09/20/20   SBritt Bottom MD    Physical Exam: Vitals:   10/08/20 2145 10/08/20 2200 10/08/20 2240 10/08/20 2312  BP: (!) 185/90 (!) 162/82 (!) 205/87 (!) 177/93  Pulse: 60 64 60 (!) 58  Resp: (!) 26 17 (!) 22 11  Temp:      TempSrc:      SpO2: 95% 98% 98% 98%     Constitutional: NAD, calm  Eyes: PERTLA, lids and conjunctivae normal ENMT: Mucous membranes are moist. Posterior pharynx clear of any exudate or lesions.  Neck: normal, supple, no masses, no thyromegaly Respiratory: clear to auscultation bilaterally, no wheezing, no crackles. No accessory muscle use.  Cardiovascular: S1 & S2 heard, regular rate and rhythm. No extremity edema. No significant JVD. Abdomen: No distension, no tenderness, soft. Bowel sounds active.  Musculoskeletal: no clubbing / cyanosis. No joint deformity upper and lower extremities.   Skin: no significant rashes, lesions, ulcers. Warm, dry, well-perfused. Neurologic: CN 2-12 grossly intact. Sensation intact, DTR normal. Strength 5/5 in all 4 limbs.  Psychiatric: Alert and oriented to person, place, and situation. Pleasant and cooperative.    Labs and Imaging on Admission: I have personally reviewed following labs and imaging studies  CBC: Recent Labs  Lab 10/08/20 1304  WBC 11.8*  HGB 16.0*  HCT 44.1  MCV 89.8  PLT 638*   Basic Metabolic Panel: Recent Labs  Lab 10/08/20 1304  NA 134*  K 2.9*  CL 98  CO2 19*  GLUCOSE 101*  BUN 11  CREATININE 0.95  CALCIUM 10.3   GFR: CrCl cannot be calculated (Unknown ideal weight.). Liver Function Tests: No results for input(s): AST, ALT, ALKPHOS, BILITOT, PROT, ALBUMIN in the last 168 hours. Recent Labs  Lab 10/08/20 2120  LIPASE 24   No results for input(s): AMMONIA in the last 168 hours. Coagulation Profile: No results for input(s): INR, PROTIME in the last 168 hours. Cardiac Enzymes: No results for input(s): CKTOTAL, CKMB, CKMBINDEX, TROPONINI in the last 168 hours. BNP (last 3  results) No results for input(s): PROBNP in the last 8760 hours. HbA1C: No results for input(s): HGBA1C in the last 72 hours. CBG: No results for input(s): GLUCAP in the last 168 hours. Lipid Profile: No results for input(s): CHOL, HDL, LDLCALC, TRIG, CHOLHDL, LDLDIRECT in the last 72 hours. Thyroid Function Tests: No results for input(s): TSH, T4TOTAL, FREET4, T3FREE, THYROIDAB in the last 72 hours. Anemia Panel: No results for input(s): VITAMINB12, FOLATE, FERRITIN, TIBC, IRON, RETICCTPCT in the last 72 hours. Urine analysis:    Component Value Date/Time   COLORURINE STRAW (A) 03/23/2020 1541   APPEARANCEUR CLEAR 03/23/2020 1541   LABSPEC 1.006 03/23/2020 1541   PHURINE 5.0 03/23/2020 1541   GLUCOSEU NEGATIVE 03/23/2020 1541   HGBUR NEGATIVE 03/23/2020 1541   BILIRUBINUR NEGATIVE 03/23/2020 1541   BILIRUBINUR negative 11/29/2016 1703   BILIRUBINUR neg 01/16/2015 0940   KETONESUR NEGATIVE 03/23/2020 1541   PROTEINUR NEGATIVE 03/23/2020 1541   UROBILINOGEN 0.2 11/29/2016 1703   UROBILINOGEN 1.0 09/13/2014 1529   NITRITE NEGATIVE 03/23/2020 1541   LEUKOCYTESUR NEGATIVE 03/23/2020 1541   Sepsis Labs: _0 (procalcitonin:4,lacticidven:4) ) Recent Results (from the past 240 hour(s))  Resp Panel by RT-PCR (Flu A&B, Covid) Nasopharyngeal Swab     Status: None   Collection Time: 10/08/20  8:18 PM   Specimen: Nasopharyngeal Swab; Nasopharyngeal(NP) swabs in vial transport medium  Result Value Ref Range Status   SARS Coronavirus 2 by RT PCR NEGATIVE NEGATIVE Final    Comment: (NOTE) SARS-CoV-2 target nucleic acids are NOT DETECTED.  The SARS-CoV-2 RNA is generally detectable in upper respiratory specimens during the acute phase of infection. The lowest concentration of SARS-CoV-2 viral copies this assay can detect is 138 copies/mL. A negative result does not preclude SARS-Cov-2 infection and should not be used as the sole basis for treatment or other patient management  decisions. A negative result may occur with  improper specimen collection/handling, submission of specimen other than nasopharyngeal swab, presence of viral mutation(s) within the areas targeted by this assay, and inadequate number  of viral copies(<138 copies/mL). A negative result must be combined with clinical observations, patient history, and epidemiological information. The expected result is Negative.  Fact Sheet for Patients:  EntrepreneurPulse.com.au  Fact Sheet for Healthcare Providers:  IncredibleEmployment.be  This test is no t yet approved or cleared by the [place] FDA and  has been authorized for detection and/or diagnosis of SARS-CoV-2 by FDA under an Emergency Use Authorization (EUA). This EUA will remain  in effect (meaning this test can be used) for the duration of the COVID-19 declaration under Section 564(b)(1) of the Act, 21 U.S.C.section 360bbb-3(b)(1), unless the authorization is terminated  or revoked sooner.       Influenza A by PCR NEGATIVE NEGATIVE Final   Influenza B by PCR NEGATIVE NEGATIVE Final    Comment: (NOTE) The Xpert Xpress SARS-CoV-2/FLU/RSV plus assay is intended as an aid in the diagnosis of influenza from Nasopharyngeal swab specimens and should not be used as a sole basis for treatment. Nasal washings and aspirates are unacceptable for Xpert Xpress SARS-CoV-2/FLU/RSV testing.  Fact Sheet for Patients: EntrepreneurPulse.com.au  Fact Sheet for Healthcare Providers: IncredibleEmployment.be  This test is not yet approved or cleared by the [place] FDA and has been authorized for detection and/or diagnosis of SARS-CoV-2 by FDA under an Emergency Use Authorization (EUA). This EUA will remain in effect (meaning this test can be used) for the duration of the COVID-19 declaration under Section 564(b)(1) of the Act, 21 U.S.C. section 360bbb-3(b)(1), unless the  authorization is terminated or revoked.  Performed at [facility], [place] [address], [place], [place] [address]      Radiological Exams on Admission: DG Chest 2 View  Result Date: 10/08/2020 CLINICAL DATA:  Chest pain today.  Hypertension. EXAM: CHEST - 2 VIEW COMPARISON:  PA and lateral chest 11/14/2019 FINDINGS: Lungs clear. Heart size normal. Coronary artery stent noted. No pneumothorax or pleural effusion. Surgical clips in the right breast and axilla are again seen. No acute or focal bony abnormality IMPRESSION: No acute disease. Electronically Signed   By: Inge Rise M.D.   On: 10/08/2020 13:17    EKG: Independently reviewed. ***  Assessment/Plan   1. Chest pain; CAD  -  -  -  -  -  -  -  -   2. Hypertensive urgency  -  -  -  -  -  -  -   3. Chronic diastolic CHF  -  -  -  -  -  -  -   4. Chronic pain; cervical spinal stenosis  -  -  -  -  -  -  -  -   5. Hypokalemia  - Potassium 2.9 on admission  - Replaced in ED, repeat chem panel in am   6. Depression  - Continue Effexor   7. Breast cancer  - Grade 2 invasive lobular cancer of right breast s/p lumpectomy and radiation in 2018  - Continue Arimidex     DVT prophylaxis: Lovenox  Code Status: Full  Family Communication: Discussed with patient  Disposition Plan:  Patient is from: Home  Anticipated d/c is to: Home  Anticipated d/c date is: 1/1 or 10/10/20 Patient currently: Pending BP control, further eval of chest pain  Consults called: ***  Admission status: Observation     Vianne Bulls, MD [facility]  10/08/2020, 11:49 PM

## 2020-10-08 NOTE — ED Triage Notes (Addendum)
Pt here with co central sharp cp that started yesterday. Pt co of sob and has a hx of 2 MIs. Pt took 324 ASA before arrival. Pt is diaphoretic.

## 2020-10-08 NOTE — H&P (Signed)
History and Physical    Glorious Flicker HGD:[id number] DOB: 1971-07-07 DOA: 10/08/2020  PCP: Patient, No Pcp Per   Patient coming from: Home   Chief Complaint: Chest pain   HPI: Anna Henson is a 49 y.o. female with medical history significant for cervical spinal stenosis, history of transverse myelitis, neuropathy, chronic pain, coronary artery disease with stent in 2018, hypertension, and depression, now presenting to the emergency department with chest pain.  Pain began on 10/07/2020, localized to the mid chest, comes and goes but without any alleviating or exacerbating factors identified.  She has had diaphoresis with this but no nausea or shortness of breath.  She has difficulty describing the character, feels that it is somewhat similar to what she experienced with her heart attack, but she was more short of breath and having nausea than unlike now.  She denies any fevers or cough.  No leg swelling or tenderness.  Denies abdominal pain.  ED Course: Upon arrival to the ED, patient is found to be afebrile, saturating well on room air, and hypertensive to 205/87.  EKG features a sinus rhythm and chest x-rays negative for acute cardiopulmonary disease.  Chemistry panel notable for potassium of 2.9, bicarbonate 19, and anion gap 17.  CBC features a mild leukocytosis and thrombocytosis.  High-sensitivity troponin was 13 and 20 and 25.  Patient was treated with Zofran, IV Lopressor, saline, oral potassium, and oral magnesium in the ED.  Review of Systems:  All other systems reviewed and apart from HPI, are negative.  Past Medical History:  Diagnosis Date  . Anemia   . Anxiety   . Bipolar disorder in full remission ([place])   . CAD in native artery cardiologist--  dr hilty   a. 09/2014 Cath/PCI in setting of [place] - s/p 2.25 x 12 mm Promus Premier DES to mid RCA;  b. 11/2016 Myoview: EF 56%, no ischemia;  c. 12/2016 NSTEMI/PCI: LM nl, LAd 25p, LCX 30p, RCA 100p (2.75x38 Promus Premier DES), mRCA 10  ISR, EF 50-55%.  . CKD (chronic kidney disease), stage III ([place])   . Depression   . Genetic testing 04/23/2017   Ms. Fouts underwent genetic counseling and testing for hereditary cancer syndromes on 04/05/2017. Her results were negative for mutations in all 46 genes analyzed by [facility]'s 46-gene Common Hereditary Cancers Panel. Genes analyzed include: APC, ATM, AXIN2, BARD1, BMPR1A, BRCA1, BRCA2, BRIP1, CDH1, CDKN2A, CHEK2, CTNNA1, DICER1, EPCAM, GREM1, HOXB13, KIT, MEN1, MLH1, MSH2, MSH3, MSH6, MUTYH, NBN,  . GERD (gastroesophageal reflux disease)   . Headache   . History of external beam radiation therapy    right breast 07-27-2017  to 09-25-2017  . History of non-ST elevation myocardial infarction (NSTEMI)   . HOCM (hypertrophic obstructive cardiomyopathy) ([place]) followed by cardiology   a. 12/2016 Echo: EF 65-70%,  mod conc LVH, dynamic obstruction @ rest, peak velocity of 291 cm/sec w/ peak gradient of 42mHg, no rwma, Gr1 DD, triv TR, PASP 160mg.  . Marland Kitchenyperlipidemia   . Hypertension   . Malignant neoplasm of lower-outer quadrant of right breast of female, estrogen receptor positive [facility] dr guLindi Adie dx 06/ 2018--- right breast invasive lobular carcinoma, ductal carcinoma w/ LCIS---- 06-21-2017 s/p right breast lumpecotmy w/ sln disseciton;   completed radiation 09-25-2017;  on tamoxifen  . Myocardial infarction ([place]   2017  . S/P drug eluting coronary stent placement    09-14-2014---  [facility]  PCI with DES x1 to proxRCA  .  Transverse myelitis [facility]) neurologist--- dr Felecia Shelling   01/ 2017  dx transverse myelitis w/ right side numbness (09-01-2019  currently lower extremity weakness, mucsle spasms, and gait disturbance)  . Wears glasses     Past Surgical History:  Procedure Laterality Date  . BREAST LUMPECTOMY WITH RADIOACTIVE SEED AND SENTINEL LYMPH NODE BIOPSY Right 06/21/2017   Procedure: RIGHT BREAST BRACKETED SEED GUIDED LUMPECTOMY AND  SENTINEL LYMPH NODE BIOPSY;  Surgeon: Jovita Kussmaul, MD;  Location: [facility];  Service: General;  Laterality: Right;  . CORONARY/GRAFT ACUTE MI REVASCULARIZATION N/A 12/31/2016   Procedure: Coronary/Graft Acute MI Revascularization;  Surgeon: Sherren Mocha, MD;  Location: [facility] [facility];  Service: Cardiovascular;  Laterality: N/A;  . DILATATION & CURETTAGE/HYSTEROSCOPY WITH MYOSURE N/A 09/02/2019   Procedure: DILATATION & CURETTAGE/HYSTEROSCOPY WITH MYOSURE;  Surgeon: Aloha Gell, MD;  Location: [facility];  Service: Gynecology;  Laterality: N/A;  . HERNIA REPAIR    . LEFT HEART CATH AND CORONARY ANGIOGRAPHY N/A 12/31/2016   Procedure: Left Heart Cath and Coronary Angiography;  Surgeon: Sherren Mocha, MD;  Location: [place] [facility];  Service: Cardiovascular;  Laterality: N/A;  . LEFT HEART CATHETERIZATION WITH CORONARY ANGIOGRAM N/A 09/14/2014   Procedure: LEFT HEART CATHETERIZATION WITH CORONARY ANGIOGRAM;  Surgeon: Burnell Blanks, MD;  Location: [facility] CATH LAB;  Service: Cardiovascular;  Laterality: N/A;  . MASS EXCISION N/A 03/30/2020   Procedure: EXCISION MASS RIGHT LABIA MINORA;  Surgeon: Lafonda Mosses, MD;  Location: [facility];  Service: Gynecology;  Laterality: N/A;  . PERCUTANEOUS CORONARY STENT INTERVENTION (PCI-S)  09/14/2014   Procedure: PERCUTANEOUS CORONARY STENT INTERVENTION (PCI-S);  Surgeon: Burnell Blanks, MD;  Location: [facility] CATH LAB;  Service: Cardiovascular;;  . ROBOTIC ASSISTED SALPINGO OOPHERECTOMY Bilateral 03/30/2020   Procedure: XI ROBOTIC ASSISTED SALPINGO OOPHORECTOMY;  Surgeon: Lafonda Mosses, MD;  Location: [facility];  Service: Gynecology;  Laterality: Bilateral;  . TONSILLECTOMY  2005  . UMBILICAL HERNIA REPAIR  2001; 2005    Social History:   reports that she quit smoking about 5 years ago. Her smoking use included cigarettes. She has a 15.00 pack-year smoking history. She has never used smokeless tobacco. She reports current  alcohol use of about 2.0 standard drinks of alcohol per week. She reports that she does not use drugs.  Allergies  Allergen Reactions  . Pork-Derived Products Other (See Comments)    Does NOT eat pork    Family History  Problem Relation Age of Onset  . Diabetes Mother   . Heart disease Mother   . Hyperlipidemia Mother   . Hypertension Mother   . Breast cancer Mother   . Lung cancer Father   . Drug abuse Father   . Brain cancer Father 7  . Bone cancer Father 16  . Drug abuse Sister   . Mental illness Brother   . Mental illness Maternal Grandmother   . Stomach cancer Paternal Grandmother 59       d.75     Prior to Admission medications   Medication Sig Start Date End Date Taking? Authorizing Provider  acetaminophen (TYLENOL) 325 MG tablet Take 325-650 mg by mouth every 8 (eight) hours as needed for moderate pain.   Yes [provider]  amLODipine (NORVASC) 10 MG tablet TAKE 1 TABLET(10 MG) BY MOUTH DAILY Patient taking differently: Take 10 mg by mouth daily. 09/23/20  Yes Hilty, Nadean Corwin, MD  anastrozole (ARIMIDEX) 1 MG tablet Take 1 tablet (1 mg total) by mouth daily. 09/01/20  Yes Nicholas Lose, MD  aspirin 81 MG chewable tablet Chew 1 tablet (81 mg total) by mouth daily. 09/16/14  Yes Nita Sells, MD  atorvastatin (LIPITOR) 80 MG tablet TAKE 1 TABLET(80 MG) BY MOUTH DAILY Patient taking differently: Take 80 mg by mouth daily. 07/01/20  Yes Hilty, Nadean Corwin, MD  baclofen (LIORESAL) 10 MG tablet TAKE BY MOUTH UP TO 4 TABLETS EVERY DAY FOR MUSCLE SPASMS Patient taking differently: Take 10 mg by mouth 4 (four) times daily as needed for muscle spasms. 09/13/20  Yes Sater, Nanine Means, MD  etodolac (LODINE) 400 MG tablet Take 1 tablet (400 mg total) by mouth 2 (two) times daily. 09/20/20  Yes Sater, Nanine Means, MD  ferrous sulfate 325 (65 FE) MG tablet Take 325 mg by mouth daily.   Yes [provider]  fluticasone (FLONASE) 50 MCG/ACT nasal spray Place 1-2  sprays into both nostrils daily as needed for allergies or rhinitis.   Yes [provider]  furosemide (LASIX) 20 MG tablet Take 1 tablet (20 mg total) by mouth daily as needed (fluid retention.). Patient taking differently: Take 20 mg by mouth daily as needed for fluid (fluid retention.). 08/30/20  Yes Hilty, Nadean Corwin, MD  gabapentin (NEURONTIN) 300 MG capsule Take 92m (3 capsules) by mouth three times daily Patient taking differently: Take 900 mg by mouth 3 (three) times daily. 09/20/20  Yes Sater, RNanine Means MD  hydrochlorothiazide (HYDRODIURIL) 25 MG tablet Take 1 tablet (25 mg total) by mouth daily. 09/09/20 12/08/20 Yes Hilty, KNadean Corwin MD  lisinopril (ZESTRIL) 20 MG tablet TAKE 1 TABLET(20 MG) BY MOUTH TWICE DAILY Patient taking differently: Take 20 mg by mouth daily. 09/23/20  Yes Hilty, KNadean Corwin MD  methocarbamol (ROBAXIN) 500 MG tablet TAKE 1 TABLET(500 MG) BY MOUTH THREE TIMES DAILY Patient taking differently: Take 500 mg by mouth 3 (three) times daily. 09/30/20  Yes Sater, RNanine Means MD  metoprolol tartrate (LOPRESSOR) 25 MG tablet Take 1 tablet (25 mg total) by mouth 2 (two) times daily. 02/09/20  Yes MAlmyra Deforest PA  nitroGLYCERIN (NITROSTAT) 0.4 MG SL tablet Place 1 tablet (0.4 mg total) under the tongue every 5 (five) minutes as needed for chest pain. 04/19/20  Yes Hilty, KNadean Corwin MD  OXcarbazepine (TRILEPTAL) 300 MG tablet Take one pill qAM and two pills po qHS Patient taking differently: Take 300-600 mg by mouth See admin instructions. Take one pill in the morning and two pills at bedtime 09/20/20  Yes Sater, RNanine Means MD  oxybutynin (DITROPAN XL) 10 MG 24 hr tablet Take 1 tablet (10 mg total) by mouth at bedtime. 08/23/20  Yes Sater, RNanine Means MD  traMADol (ULTRAM) 50 MG tablet TAKE 1 TABLET(50 MG) BY MOUTH TWICE DAILY AS NEEDED Patient taking differently: Take 50 mg by mouth every 12 (twelve) hours as needed for moderate pain. 09/20/20  Yes Sater, RNanine Means MD   venlafaxine XR (EFFEXOR-XR) 75 MG 24 hr capsule TAKE 1 CAPSULE(75 MG) BY MOUTH DAILY WITH BREAKFAST Patient taking differently: Take 75 mg by mouth daily. 08/20/20  Yes GNicholas Lose MD  celecoxib (CELEBREX) 200 MG capsule TAKE 1 CAPSULE(200 MG) BY MOUTH TWICE DAILY Patient not taking: No sig reported 07/08/20   SBritt Bottom MD  methylPREDNISolone (MEDROL) 4 MG tablet Taper from 6 pills po for one day to 1 pill po the last day over 6 days Patient not taking: No sig reported 09/20/20   SBritt Bottom MD    Physical Exam: Vitals:  10/08/20 2145 10/08/20 2200 10/08/20 2240 10/08/20 2312  BP: (!) 185/90 (!) 162/82 (!) 205/87 (!) 177/93  Pulse: 60 64 60 (!) 58  Resp: (!) 26 17 (!) 22 11  Temp:      TempSrc:      SpO2: 95% 98% 98% 98%    Constitutional: NAD, calm  Eyes: PERTLA, lids and conjunctivae normal ENMT: Mucous membranes are moist. Posterior pharynx clear of any exudate or lesions.   Neck: normal, supple, no masses, no thyromegaly Respiratory:  no wheezing, no crackles. No accessory muscle use.  Cardiovascular: S1 & S2 heard, regular rate and rhythm. No extremity edema.   Abdomen: No distension, no tenderness, soft. Bowel sounds active.  Musculoskeletal: no clubbing / cyanosis. No joint deformity upper and lower extremities.   Skin: no significant rashes, lesions, ulcers. Warm, dry, well-perfused. Neurologic: CN 2-12 grossly intact. Moving all extremities.  Psychiatric: Alert and oriented to person, place, and situation. Very pleasant and cooperative.    Labs and Imaging on Admission: I have personally reviewed following labs and imaging studies  CBC: Recent Labs  Lab 10/08/20 1304  WBC 11.8*  HGB 16.0*  HCT 44.1  MCV 89.8  PLT 992*   Basic Metabolic Panel: Recent Labs  Lab 10/08/20 1304  NA 134*  K 2.9*  CL 98  CO2 19*  GLUCOSE 101*  BUN 11  CREATININE 0.95  CALCIUM 10.3   GFR: CrCl cannot be calculated (Unknown ideal weight.). Liver Function  Tests: No results for input(s): AST, ALT, ALKPHOS, BILITOT, PROT, ALBUMIN in the last 168 hours. Recent Labs  Lab 10/08/20 2120  LIPASE 24   No results for input(s): AMMONIA in the last 168 hours. Coagulation Profile: No results for input(s): INR, PROTIME in the last 168 hours. Cardiac Enzymes: No results for input(s): CKTOTAL, CKMB, CKMBINDEX, TROPONINI in the last 168 hours. BNP (last 3 results) No results for input(s): PROBNP in the last 8760 hours. HbA1C: No results for input(s): HGBA1C in the last 72 hours. CBG: No results for input(s): GLUCAP in the last 168 hours. Lipid Profile: No results for input(s): CHOL, HDL, LDLCALC, TRIG, CHOLHDL, LDLDIRECT in the last 72 hours. Thyroid Function Tests: No results for input(s): TSH, T4TOTAL, FREET4, T3FREE, THYROIDAB in the last 72 hours. Anemia Panel: No results for input(s): VITAMINB12, FOLATE, FERRITIN, TIBC, IRON, RETICCTPCT in the last 72 hours. Urine analysis:    Component Value Date/Time   COLORURINE STRAW (A) 03/23/2020 1541   APPEARANCEUR CLEAR 03/23/2020 1541   LABSPEC 1.006 03/23/2020 1541   PHURINE 5.0 03/23/2020 1541   GLUCOSEU NEGATIVE 03/23/2020 1541   HGBUR NEGATIVE 03/23/2020 1541   BILIRUBINUR NEGATIVE 03/23/2020 1541   BILIRUBINUR negative 11/29/2016 1703   BILIRUBINUR neg 01/16/2015 0940   KETONESUR NEGATIVE 03/23/2020 1541   PROTEINUR NEGATIVE 03/23/2020 1541   UROBILINOGEN 0.2 11/29/2016 1703   UROBILINOGEN 1.0 09/13/2014 1529   NITRITE NEGATIVE 03/23/2020 1541   LEUKOCYTESUR NEGATIVE 03/23/2020 1541   Sepsis Labs: _0 (procalcitonin:4,lacticidven:4) ) Recent Results (from the past 240 hour(s))  Resp Panel by RT-PCR (Flu A&B, Covid) Nasopharyngeal Swab     Status: None   Collection Time: 10/08/20  8:18 PM   Specimen: Nasopharyngeal Swab; Nasopharyngeal(NP) swabs in vial transport medium  Result Value Ref Range Status   SARS Coronavirus 2 by RT PCR NEGATIVE NEGATIVE Final    Comment:  (NOTE) SARS-CoV-2 target nucleic acids are NOT DETECTED.  The SARS-CoV-2 RNA is generally detectable in upper respiratory specimens during the acute phase of infection.  The lowest concentration of SARS-CoV-2 viral copies this assay can detect is 138 copies/mL. A negative result does not preclude SARS-Cov-2 infection and should not be used as the sole basis for treatment or other patient management decisions. A negative result may occur with  improper specimen collection/handling, submission of specimen other than nasopharyngeal swab, presence of viral mutation(s) within the areas targeted by this assay, and inadequate number of viral copies(<138 copies/mL). A negative result must be combined with clinical observations, patient history, and epidemiological information. The expected result is Negative.  Fact Sheet for Patients:  EntrepreneurPulse.com.au  Fact Sheet for Healthcare Providers:  IncredibleEmployment.be  This test is no t yet approved or cleared by the [place] FDA and  has been authorized for detection and/or diagnosis of SARS-CoV-2 by FDA under an Emergency Use Authorization (EUA). This EUA will remain  in effect (meaning this test can be used) for the duration of the COVID-19 declaration under Section 564(b)(1) of the Act, 21 U.S.C.section 360bbb-3(b)(1), unless the authorization is terminated  or revoked sooner.       Influenza A by PCR NEGATIVE NEGATIVE Final   Influenza B by PCR NEGATIVE NEGATIVE Final    Comment: (NOTE) The Xpert Xpress SARS-CoV-2/FLU/RSV plus assay is intended as an aid in the diagnosis of influenza from Nasopharyngeal swab specimens and should not be used as a sole basis for treatment. Nasal washings and aspirates are unacceptable for Xpert Xpress SARS-CoV-2/FLU/RSV testing.  Fact Sheet for Patients: EntrepreneurPulse.com.au  Fact Sheet for Healthcare  Providers: IncredibleEmployment.be  This test is not yet approved or cleared by the [place] FDA and has been authorized for detection and/or diagnosis of SARS-CoV-2 by FDA under an Emergency Use Authorization (EUA). This EUA will remain in effect (meaning this test can be used) for the duration of the COVID-19 declaration under Section 564(b)(1) of the Act, 21 U.S.C. section 360bbb-3(b)(1), unless the authorization is terminated or revoked.  Performed at [facility], [place] [address] [place], [place] [address]      Radiological Exams on Admission: DG Chest 2 View  Result Date: 10/08/2020 CLINICAL DATA:  Chest pain today.  Hypertension. EXAM: CHEST - 2 VIEW COMPARISON:  PA and lateral chest 11/14/2019 FINDINGS: Lungs clear. Heart size normal. Coronary artery stent noted. No pneumothorax or pleural effusion. Surgical clips in the right breast and axilla are again seen. No acute or focal bony abnormality IMPRESSION: No acute disease. Electronically Signed   By: Inge Rise M.D.   On: 10/08/2020 13:17    EKG: Independently reviewed. Sinus rhythm.   Assessment/Plan   1. Chest pain; CAD  - Presents with 2 days of chest pain, has hx of CAD with DES to RCA in 2018 and had mild non-obstructive LAD/LCx disease also at that time  - She took ASA 324 prior to arrival  - No ischemic features noted on EKG, no acute findings on CXR, HS troponin 13 then 20 then 25  - Continue cardiac monitoring, repeat troponin and EKG, trial NTG, continue beta-blocker, statin, and ASA     2. Hypertensive urgency  - SBP as high as 205 in ED, down to 190 after IV Lopressor  - HR now in 50s, will use hydralazine as needed and continue her home Norvasc, HCTZ, lisinopril, and metoprolol as tolerated    3. Chronic diastolic CHF  - Appears compensated  - Continue beta-blocker and ACE-i   4. Chronic pain; cervical spinal stenosis  - Hold NSAID, continue other home medications  5. Hypokalemia  - Potassium 2.9 on admission  - Replaced in ED, repeat chem panel in am   6. Depression  - Continue Effexor   7. Breast cancer  - Grade 2 invasive lobular cancer of right breast s/p lumpectomy and radiation in 2018  - Continue Arimidex     DVT prophylaxis: Lovenox  Code Status: Full  Family Communication: Discussed with patient  Disposition Plan:  Patient is from: Home  Anticipated d/c is to: Home  Anticipated d/c date is: 1/1 or 10/10/20 Patient currently: Pending BP control, further eval of chest pain  Consults called: None   Admission status: Observation     Vianne Bulls, MD [facility]  10/08/2020, 11:49 PM

## 2020-10-08 NOTE — ED Provider Notes (Signed)
[facility] Emergency Department Provider Note MRN:  [id number]  Arrival date & time: 10/08/20     Chief Complaint   Chest Pain   History of Present Illness   Anna Henson is a 49 y.o. year-old female with a history of hypertrophic cardiomyopathy, CAD status post stent placement presenting to the ED with chief complaint of chest pain.  Sharp central chest pain starting yesterday, intermittent since that time. Associated with diaphoresis, nausea, dry heaving, denies shortness of breath, no lightheadedness, also endorsing some epigastric discomfort. Denies lower abdominal pain, no fever, no cough, general malaise and weakness noted.  Review of Systems  A complete 10 system review of systems was obtained and all systems are negative except as noted in the HPI and PMH.   Patient's Health History    Past Medical History:  Diagnosis Date  . Anemia   . Anxiety   . Bipolar disorder in full remission ([place])   . CAD in native artery cardiologist--  dr hilty   a. 09/2014 Cath/PCI in setting of [place] - s/p 2.25 x 12 mm Promus Premier DES to mid RCA;  b. 11/2016 Myoview: EF 56%, no ischemia;  c. 12/2016 NSTEMI/PCI: LM nl, LAd 25p, LCX 30p, RCA 100p (2.75x38 Promus Premier DES), mRCA 10 ISR, EF 50-55%.  . CKD (chronic kidney disease), stage III ([place])   . Depression   . Genetic testing 04/23/2017   Ms. Hurd underwent genetic counseling and testing for hereditary cancer syndromes on 04/05/2017. Her results were negative for mutations in all 46 genes analyzed by [facility]'s 46-gene Common Hereditary Cancers Panel. Genes analyzed include: APC, ATM, AXIN2, BARD1, BMPR1A, BRCA1, BRCA2, BRIP1, CDH1, CDKN2A, CHEK2, CTNNA1, DICER1, EPCAM, GREM1, HOXB13, KIT, MEN1, MLH1, MSH2, MSH3, MSH6, MUTYH, NBN,  . GERD (gastroesophageal reflux disease)   . Headache   . History of external beam radiation therapy    right breast 07-27-2017  to 09-25-2017  . History of non-ST elevation myocardial  infarction (NSTEMI)   . HOCM (hypertrophic obstructive cardiomyopathy) ([place]) followed by cardiology   a. 12/2016 Echo: EF 65-70%,  mod conc LVH, dynamic obstruction @ rest, peak velocity of 291 cm/sec w/ peak gradient of 75mHg, no rwma, Gr1 DD, triv TR, PASP 161mg.  . Marland Kitchenyperlipidemia   . Hypertension   . Malignant neoplasm of lower-outer quadrant of right breast of female, estrogen receptor positive [facility] gu[facility]indi Adie dx 06/ 2018--- right breast invasive lobular carcinoma, ductal carcinoma w/ LCIS---- 06-21-2017 s/p right breast lumpecotmy w/ sln disseciton;   completed radiation 09-25-2017;  on tamoxifen  . [place]ocardial infarction [place]   [place]  . S/P drug eluting coronary stent placement    09-14-2014[facility]  PCI with DES x1 to proxRCA  . [facility] dr saFelecia Shelling 01/ 2017  dx transverse myelitis w/ right side numbness (09-01-2019  currently lower extremity weakness, mucsle spasms, and gait disturbance)  . Wears glasses     Past Surgical History:  Procedure Laterality Date  . BREAST LUMPECTOMY WITH RADIOACTIVE SEED AND SENTINEL LYMPH NODE BIOPSY Right 06/21/2017   Procedure: RIGHT BREAST BRACKETED SEED GUIDED LUMPECTOMY AND SENTINEL LYMPH NODE BIOPSY;  Surgeon: ToJovita KussmaulMD;  [facility]: General;  Laterality: Right;  . CORONARY/GRAFT ACUTE MI REVASCULARIZATION N/A 12/31/2016   Procedure: Coronary/Graft Acute MI Revascularization;  Surgeon: MiSherren MochaMD;  [place] [facility];  Service: Cardiovascular;  Laterality: N/A;  .  DILATATION & CURETTAGE/HYSTEROSCOPY WITH MYOSURE N/A 09/02/2019   Procedure: DILATATION & CURETTAGE/HYSTEROSCOPY WITH MYOSURE;  Surgeon: Aloha Gell, MD;  Location: [facility];  Service: Gynecology;  Laterality: N/A;  . HERNIA REPAIR    . LEFT HEART CATH AND CORONARY ANGIOGRAPHY N/A 12/31/2016   Procedure: Left Heart Cath and Coronary Angiography;   Surgeon: Sherren Mocha, MD;  Location: [place] CV LAB;  Service: Cardiovascular;  Laterality: N/A;  . LEFT HEART CATHETERIZATION WITH CORONARY ANGIOGRAM N/A 09/14/2014   Procedure: LEFT HEART CATHETERIZATION WITH CORONARY ANGIOGRAM;  Surgeon: Burnell Blanks, MD;  Location[facility] CATH LAB;  Service: Cardiovascular;  Laterality: N/A;  . MASS EXCISION N/A 03/30/2020   Procedure: EXCISION MASS RIGHT LABIA MINORA;  Surgeon: Lafonda Mosses, MD;  Location: [facility];  Service: Gynecology;  Laterality: N/A;  . PERCUTANEOUS CORONARY STENT INTERVENTION (PCI-S)  09/14/2014   Procedure: PERCUTANEOUS CORONARY STENT INTERVENTION (PCI-S);  Surgeon: Burnell Blanks, MD;  Location: [facility] CATH LAB;  Service: Cardiovascular;;  . ROBOTIC ASSISTED SALPINGO OOPHERECTOMY Bilateral 03/30/2020   Procedure: XI ROBOTIC ASSISTED SALPINGO OOPHORECTOMY;  Surgeon: Lafonda Mosses, MD;  Location: [facility];  Service: Gynecology;  Laterality: Bilateral;  . TONSILLECTOMY  2005  . UMBILICAL HERNIA REPAIR  2001; 2005    Family History  Problem Relation Age of Onset  . Diabetes Mother   . Heart disease Mother   . Hyperlipidemia Mother   . Hypertension Mother   . Breast cancer Mother   . Lung cancer Father   . Drug abuse Father   . Brain cancer Father 16  . Bone cancer Father 34  . Drug abuse Sister   . Mental illness Brother   . Mental illness Maternal Grandmother   . Stomach cancer Paternal Grandmother 12       d.75    Social History   Socioeconomic History  . Marital status: Single    Spouse name: Not on file  . Number of children: 0  . Years of education: masters  . Highest education level: Not on file  Occupational History  . Occupation: Therapist, art at a call center    Employer: [employer]  Tobacco Use  . Smoking status: Former Smoker    Packs/day: 0.50    Years: 30.00    Pack years: 15.00    Types: Cigarettes    Quit date: 10/05/2015    Years since quitting: 5.0  . Smokeless tobacco:  Never Used  Vaping Use  . Vaping Use: Former  Substance and Sexual Activity  . Alcohol use: Yes    Alcohol/week: 2.0 standard drinks    Types: 2 Cans of beer per week    Comment: occas.  . Drug use: No  . Sexual activity: Not Currently  Other Topics Concern  . Not on file  Social History Narrative   Consumes 2 cups of caffeine daily   Social Determinants of Health   Financial Resource Strain: Not on file  Food Insecurity: Not on file  Transportation Needs: Not on file  Physical Activity: Not on file  Stress: Not on file  Social Connections: Not on file  Intimate Partner Violence: Not on file     Physical Exam   Vitals:   10/08/20 2240 10/08/20 2312  BP: (!) 205/87 (!) 177/93  Pulse: 60 (!) 58  Resp: (!) 22 11  Temp:    SpO2: 98% 98%    CONSTITUTIONAL: Well-appearing, in mild distress due to pain NEURO:  Alert and oriented x 3,  no focal deficits EYES:  eyes equal and reactive ENT/NECK:  no LAD, no JVD CARDIO: Regular rate, well-perfused, normal S1 and S2 PULM:  CTAB no wheezing or rhonchi GI/GU:  normal bowel sounds, non-distended, non-tender MSK/SPINE:  No gross deformities, no edema SKIN:  no rash, atraumatic PSYCH:  Appropriate speech and behavior  *Additional and/or pertinent findings included in MDM below  Diagnostic and Interventional Summary    EKG Interpretation  Date/Time:  Friday October 08 2020 12:51:52 EST Ventricular Rate:  75 PR Interval:  124 QRS Duration: 82 QT Interval:  396 QTC Calculation: 442 R Axis:   65 Text Interpretation: Normal sinus rhythm Normal ECG Confirmed by Gerlene Fee [phone]) on 10/08/2020 8:01:32 PM      Labs Reviewed  BASIC METABOLIC PANEL - Abnormal; Notable for the following components:      Result Value   Sodium 134 (*)    Potassium 2.9 (*)    CO2 19 (*)    Glucose, Bld 101 (*)    Anion gap 17 (*)    All other components within normal limits  CBC - Abnormal; Notable for the following components:   WBC  11.8 (*)    Hemoglobin 16.0 (*)    MCHC 36.3 (*)    Platelets 409 (*)    All other components within normal limits  TROPONIN I (HIGH SENSITIVITY) - Abnormal; Notable for the following components:   Troponin I (High Sensitivity) 20 (*)    All other components within normal limits  TROPONIN I (HIGH SENSITIVITY) - Abnormal; Notable for the following components:   Troponin I (High Sensitivity) 25 (*)    All other components within normal limits  RESP PANEL BY RT-PCR (FLU A&B, COVID) ARPGX2  LIPASE, BLOOD  HIV ANTIBODY (ROUTINE TESTING W REFLEX)  BASIC METABOLIC PANEL  MAGNESIUM  LIPID PANEL  I-STAT BETA HCG BLOOD, ED ([facility], [facility], [facility] ONLY)  TROPONIN I (HIGH SENSITIVITY)  TROPONIN I (HIGH SENSITIVITY)    DG Chest 2 View  Final Result      Medications  aspirin chewable tablet 81 mg (has no administration in time range)  traMADol (ULTRAM) tablet 50 mg (has no administration in time range)  anastrozole (ARIMIDEX) tablet 1 mg (has no administration in time range)  amLODipine (NORVASC) tablet 10 mg (has no administration in time range)  atorvastatin (LIPITOR) tablet 80 mg (has no administration in time range)  hydrochlorothiazide (HYDRODIURIL) tablet 25 mg (has no administration in time range)  lisinopril (ZESTRIL) tablet 20 mg (has no administration in time range)  metoprolol tartrate (LOPRESSOR) tablet 25 mg (has no administration in time range)  venlafaxine XR (EFFEXOR-XR) 24 hr capsule 75 mg (has no administration in time range)  oxybutynin (DITROPAN-XL) 24 hr tablet 10 mg (has no administration in time range)  baclofen (LIORESAL) tablet 10 mg (has no administration in time range)  gabapentin (NEURONTIN) capsule 600 mg (has no administration in time range)  methocarbamol (ROBAXIN) tablet 500 mg (has no administration in time range)  nitroGLYCERIN (NITROSTAT) SL tablet 0.4 mg (has no administration in time range)  acetaminophen (TYLENOL) tablet 650 mg (has no administration in time range)   ondansetron (ZOFRAN) injection 4 mg (has no administration in time range)  enoxaparin (LOVENOX) injection 40 mg (has no administration in time range)  hydrALAZINE (APRESOLINE) tablet 50 mg (has no administration in time range)  potassium chloride 10 mEq in 100 mL IVPB (has no administration in time range)  Oxcarbazepine (TRILEPTAL) tablet 300 mg (has no administration in  time range)    And  Oxcarbazepine (TRILEPTAL) tablet 600 mg (has no administration in time range)  ondansetron (ZOFRAN) injection 4 mg (4 mg Intravenous Given 10/08/20 2141)  metoprolol tartrate (LOPRESSOR) injection 5 mg (5 mg Intravenous Given 10/08/20 2141)  sodium chloride 0.9 % bolus 500 mL (0 mLs Intravenous Stopped 10/08/20 2318)  potassium chloride SA (KLOR-CON) CR tablet 40 mEq (40 mEq Oral Given 10/08/20 2051)  magnesium oxide (MAG-OX) tablet 800 mg (800 mg Oral Given 10/08/20 2051)     Procedures  /  Critical Care Procedures  ED Course and Medical Decision Making  I have reviewed the triage vital signs, the nursing notes, and pertinent available records from the EMR.  Listed above are laboratory and imaging tests that I personally ordered, reviewed, and interpreted and then considered in my medical decision making (see below for details).  Given patient's cardiac history the presentation is concerning for possible unstable angina. Continued nausea and discomfort, currently without chest pain. Troponin in triage is minimally rising from 13-20. Will obtain a third troponin, attempt symptomatic management. Patient is hypertensive, currently anticipating hospitalist admission unless the troponin is significantly rising, at which point we would admit to cardiology.     Third troponin is again minimally rising, admitted to hospitalist service for further care.  Barth Kirks. [place]onia Small, [place] [email or web address]  Final Clinical Impressions(s) / ED Diagnoses      ICD-10-CM   1. Chest pain, unspecified type  R07.9     ED Discharge Orders    None       Discharge Instructions Discussed with and Provided to Patient:   Discharge Instructions   None       Maudie Flakes, MD 10/08/20 [phone]

## 2020-10-09 ENCOUNTER — Other Ambulatory Visit: Payer: Self-pay

## 2020-10-09 DIAGNOSIS — Z20822 Contact with and (suspected) exposure to covid-19: Secondary | ICD-10-CM | POA: Diagnosis not present

## 2020-10-09 DIAGNOSIS — N183 Chronic kidney disease, stage 3 unspecified: Secondary | ICD-10-CM | POA: Diagnosis not present

## 2020-10-09 DIAGNOSIS — R0789 Other chest pain: Secondary | ICD-10-CM

## 2020-10-09 DIAGNOSIS — I5032 Chronic diastolic (congestive) heart failure: Secondary | ICD-10-CM

## 2020-10-09 DIAGNOSIS — I2510 Atherosclerotic heart disease of native coronary artery without angina pectoris: Secondary | ICD-10-CM | POA: Diagnosis not present

## 2020-10-09 LAB — BASIC METABOLIC PANEL
Anion gap: 12 (ref 5–15)
BUN: 14 mg/dL (ref 6–20)
CO2: 24 mmol/L (ref 22–32)
Calcium: 9.8 mg/dL (ref 8.9–10.3)
Chloride: 101 mmol/L (ref 98–111)
Creatinine, Ser: 1.13 mg/dL — ABNORMAL HIGH (ref 0.44–1.00)
GFR, Estimated: >60 mL/min (ref >60)
Glucose, Bld: 92 mg/dL (ref 70–99)
Potassium: 3.5 mmol/L (ref 3.5–5.1)
Sodium: 137 mmol/L (ref 135–145)

## 2020-10-09 LAB — LIPID PANEL
Cholesterol: 226 mg/dL — ABNORMAL HIGH (ref 0–200)
HDL: 29 mg/dL — ABNORMAL LOW (ref >40)
LDL Cholesterol: 161 mg/dL — ABNORMAL HIGH (ref 0–99)
Total CHOL/HDL Ratio: 7.8 RATIO
Triglycerides: 181 mg/dL — ABNORMAL HIGH (ref <150)
VLDL: 36 mg/dL (ref 0–40)

## 2020-10-09 LAB — HIV ANTIBODY (ROUTINE TESTING W REFLEX): HIV Screen 4th Generation wRfx: NONREACTIVE

## 2020-10-09 LAB — MAGNESIUM: Magnesium: 2.3 mg/dL (ref 1.7–2.4)

## 2020-10-09 LAB — TROPONIN I (HIGH SENSITIVITY): Troponin I (High Sensitivity): 24 ng/L — ABNORMAL HIGH (ref <18)

## 2020-10-09 MED ORDER — LISINOPRIL 20 MG PO TABS
20.0000 mg | ORAL_TABLET | Freq: Every day | ORAL | Status: DC
  Administered 2020-10-09 (×2): 20 mg via ORAL
  Filled 2020-10-09 (×2): qty 1

## 2020-10-09 MED ORDER — ALUM & MAG HYDROXIDE-SIMETH 200-200-20 MG/5ML PO SUSP
30.0000 mL | Freq: Once | ORAL | Status: AC
  Administered 2020-10-09: 30 mL via ORAL
  Filled 2020-10-09: qty 30

## 2020-10-09 MED ORDER — HYDRALAZINE HCL 20 MG/ML IJ SOLN
5.0000 mg | Freq: Once | INTRAMUSCULAR | Status: AC
  Administered 2020-10-09: 5 mg via INTRAVENOUS
  Filled 2020-10-09: qty 1

## 2020-10-09 MED ORDER — OXYCODONE HCL 5 MG PO TABS
5.0000 mg | ORAL_TABLET | Freq: Once | ORAL | Status: AC
  Administered 2020-10-09: 5 mg via ORAL
  Filled 2020-10-09: qty 1

## 2020-10-09 MED ORDER — LIDOCAINE VISCOUS HCL 2 % MT SOLN
15.0000 mL | Freq: Once | OROMUCOSAL | Status: DC
  Filled 2020-10-09: qty 15

## 2020-10-09 MED ORDER — LORAZEPAM 2 MG/ML IJ SOLN
0.5000 mg | Freq: Once | INTRAMUSCULAR | Status: AC
  Administered 2020-10-09: 0.5 mg via INTRAVENOUS
  Filled 2020-10-09: qty 1

## 2020-10-09 MED ORDER — LISINOPRIL 40 MG PO TABS: 40.0000 mg | ORAL_TABLET | Freq: Every day | ORAL | Status: DC

## 2020-10-09 MED ORDER — LISINOPRIL 40 MG PO TABS: 40.0000 mg | ORAL_TABLET | Freq: Every day | ORAL | 0 refills | Status: DC

## 2020-10-09 NOTE — Discharge Instructions (Signed)
Hypertension, Adult Hypertension is another name for high blood pressure. High blood pressure forces your heart to work harder to pump blood. This can cause problems over time. There are two numbers in a blood pressure reading. There is a top number (systolic) over a bottom number (diastolic). It is best to have a blood pressure that is below 120/80. Healthy choices can help lower your blood pressure, or you may need medicine to help lower it. What are the causes? The cause of this condition is not known. Some conditions may be related to high blood pressure. What increases the risk?  Smoking.  Having type 2 diabetes mellitus, high cholesterol, or both.  Not getting enough exercise or physical activity.  Being overweight.  Having too much fat, sugar, calories, or salt (sodium) in your diet.  Drinking too much alcohol.  Having long-term (chronic) kidney disease.  Having a family history of high blood pressure.  Age. Risk increases with age.  Race. You may be at higher risk if you are African American.  Gender. Men are at higher risk than women before age 45. After age 65, women are at higher risk than men.  Having obstructive sleep apnea.  Stress. What are the signs or symptoms?  High blood pressure may not cause symptoms. Very high blood pressure (hypertensive crisis) may cause: ? Headache. ? Feelings of worry or nervousness (anxiety). ? Shortness of breath. ? Nosebleed. ? A feeling of being sick to your stomach (nausea). ? Throwing up (vomiting). ? Changes in how you see. ? Very bad chest pain. ? Seizures. How is this treated?  This condition is treated by making healthy lifestyle changes, such as: ? Eating healthy foods. ? Exercising more. ? Drinking less alcohol.  Your health care provider may prescribe medicine if lifestyle changes are not enough to get your blood pressure under control, and if: ? Your top number is above 130. ? Your bottom number is above  80.  Your personal target blood pressure may vary. Follow these instructions at home: Eating and drinking   If told, follow the DASH eating plan. To follow this plan: ? Fill one half of your plate at each meal with fruits and vegetables. ? Fill one fourth of your plate at each meal with whole grains. Whole grains include whole-wheat pasta, brown rice, and whole-grain bread. ? Eat or drink low-fat dairy products, such as skim milk or low-fat yogurt. ? Fill one fourth of your plate at each meal with low-fat (lean) proteins. Low-fat proteins include fish, chicken without skin, eggs, beans, and tofu. ? Avoid fatty meat, cured and processed meat, or chicken with skin. ? Avoid pre-made or processed food.  Eat less than 1,500 mg of salt each day.  Do not drink alcohol if: ? Your doctor tells you not to drink. ? You are pregnant, may be pregnant, or are planning to become pregnant.  If you drink alcohol: ? Limit how much you use to:  0-1 drink a day for women.  0-2 drinks a day for men. ? Be aware of how much alcohol is in your drink. In the [place] one drink equals one 12 oz bottle of beer (355 mL), one 5 oz glass of wine (148 mL), or one 1 oz glass of hard liquor (44 mL). Lifestyle   Work with your doctor to stay at a healthy weight or to lose weight. Ask your doctor what the best weight is for you.  Get at least 30 minutes of exercise most   days of the week. This may include walking, swimming, or biking.  Get at least 30 minutes of exercise that strengthens your muscles (resistance exercise) at least 3 days a week. This may include lifting weights or doing Pilates.  Do not use any products that contain nicotine or tobacco, such as cigarettes, e-cigarettes, and chewing tobacco. If you need help quitting, ask your doctor.  Check your blood pressure at home as told by your doctor.  Keep all follow-up visits as told by your doctor. This is important. Medicines  Take over-the-counter  and prescription medicines only as told by your doctor. Follow directions carefully.  Do not skip doses of blood pressure medicine. The medicine does not work as well if you skip doses. Skipping doses also puts you at risk for problems.  Ask your doctor about side effects or reactions to medicines that you should watch for. Contact a doctor if you:  Think you are having a reaction to the medicine you are taking.  Have headaches that keep coming back (recurring).  Feel dizzy.  Have swelling in your ankles.  Have trouble with your vision. Get help right away if you:  Get a very bad headache.  Start to feel mixed up (confused).  Feel weak or numb.  Feel faint.  Have very bad pain in your: ? Chest. ? Belly (abdomen).  Throw up more than once.  Have trouble breathing. Summary  Hypertension is another name for high blood pressure.  High blood pressure forces your heart to work harder to pump blood.  For most people, a normal blood pressure is less than 120/80.  Making healthy choices can help lower blood pressure. If your blood pressure does not get lower with healthy choices, you may need to take medicine. This information is not intended to replace advice given to you by your health care provider. Make sure you discuss any questions you have with your health care provider. Document Revised: 06/05/2018 Document Reviewed: 06/05/2018 Elsevier Patient Education  2020 Elsevier Inc.    Low-Sodium Eating Plan Sodium, which is an element that makes up salt, helps you maintain a healthy balance of fluids in your body. Too much sodium can increase your blood pressure and cause fluid and waste to be held in your body. Your health care provider or dietitian may recommend following this plan if you have high blood pressure (hypertension), kidney disease, liver disease, or heart failure. Eating less sodium can help lower your blood pressure, reduce swelling, and protect your heart,  liver, and kidneys. What are tips for following this plan? General guidelines  Most people on this plan should limit their sodium intake to 1,500-2,000 mg (milligrams) of sodium each day. Reading food labels   The Nutrition Facts label lists the amount of sodium in one serving of the food. If you eat more than one serving, you must multiply the listed amount of sodium by the number of servings.  Choose foods with less than 140 mg of sodium per serving.  Avoid foods with 300 mg of sodium or more per serving. Shopping  Look for lower-sodium products, often labeled as "low-sodium" or "no salt added."  Always check the sodium content even if foods are labeled as "unsalted" or "no salt added".  Buy fresh foods. ? Avoid canned foods and premade or frozen meals. ? Avoid canned, cured, or processed meats  Buy breads that have less than 80 mg of sodium per slice. Cooking  Eat more home-cooked food and less restaurant, buffet,  and fast food.  Avoid adding salt when cooking. Use salt-free seasonings or herbs instead of table salt or sea salt. Check with your health care provider or pharmacist before using salt substitutes.  Cook with plant-based oils, such as canola, sunflower, or olive oil. Meal planning  When eating at a restaurant, ask that your food be prepared with less salt or no salt, if possible.  Avoid foods that contain MSG (monosodium glutamate). MSG is sometimes added to [place] food, bouillon, and some canned foods. What foods are recommended? The items listed may not be a complete list. Talk with your dietitian about what dietary choices are best for you. Grains Low-sodium cereals, including oats, puffed wheat and rice, and shredded wheat. Low-sodium crackers. Unsalted rice. Unsalted pasta. Low-sodium bread. Whole-grain breads and whole-grain pasta. Vegetables Fresh or frozen vegetables. "No salt added" canned vegetables. "No salt added" tomato sauce and paste. Low-sodium  or reduced-sodium tomato and vegetable juice. Fruits Fresh, frozen, or canned fruit. Fruit juice. Meats and other protein foods Fresh or frozen (no salt added) meat, poultry, seafood, and fish. Low-sodium canned tuna and salmon. Unsalted nuts. Dried peas, beans, and lentils without added salt. Unsalted canned beans. Eggs. Unsalted nut butters. Dairy Milk. Soy milk. Cheese that is naturally low in sodium, such as ricotta cheese, fresh mozzarella, or Swiss cheese Low-sodium or reduced-sodium cheese. Cream cheese. Yogurt. Fats and oils Unsalted butter. Unsalted margarine with no trans fat. Vegetable oils such as canola or olive oils. Seasonings and other foods Fresh and dried herbs and spices. Salt-free seasonings. Low-sodium mustard and ketchup. Sodium-free salad dressing. Sodium-free light mayonnaise. Fresh or refrigerated horseradish. Lemon juice. Vinegar. Homemade, reduced-sodium, or low-sodium soups. Unsalted popcorn and pretzels. Low-salt or salt-free chips. What foods are not recommended? The items listed may not be a complete list. Talk with your dietitian about what dietary choices are best for you. Grains Instant hot cereals. Bread stuffing, pancake, and biscuit mixes. Croutons. Seasoned rice or pasta mixes. Noodle soup cups. Boxed or frozen macaroni and cheese. Regular salted crackers. Self-rising flour. Vegetables Sauerkraut, pickled vegetables, and relishes. Olives. [place] fries. Onion rings. Regular canned vegetables (not low-sodium or reduced-sodium). Regular canned tomato sauce and paste (not low-sodium or reduced-sodium). Regular tomato and vegetable juice (not low-sodium or reduced-sodium). Frozen vegetables in sauces. Meats and other protein foods Meat or fish that is salted, canned, smoked, spiced, or pickled. Bacon, ham, sausage, hotdogs, corned beef, chipped beef, packaged lunch meats, salt pork, jerky, pickled herring, anchovies, regular canned tuna, sardines, salted  nuts. Dairy Processed cheese and cheese spreads. Cheese curds. Blue cheese. Feta cheese. String cheese. Regular cottage cheese. Buttermilk. Canned milk. Fats and oils Salted butter. Regular margarine. Ghee. Bacon fat. Seasonings and other foods Onion salt, garlic salt, seasoned salt, table salt, and sea salt. Canned and packaged gravies. Worcestershire sauce. Tartar sauce. Barbecue sauce. Teriyaki sauce. Soy sauce, including reduced-sodium. Steak sauce. Fish sauce. Oyster sauce. Cocktail sauce. Horseradish that you find on the shelf. Regular ketchup and mustard. Meat flavorings and tenderizers. Bouillon cubes. Hot sauce and Tabasco sauce. Premade or packaged marinades. Premade or packaged taco seasonings. Relishes. Regular salad dressings. Salsa. Potato and tortilla chips. Corn chips and puffs. Salted popcorn and pretzels. Canned or dried soups. Pizza. Frozen entrees and pot pies. Summary  Eating less sodium can help lower your blood pressure, reduce swelling, and protect your heart, liver, and kidneys.  Most people on this plan should limit their sodium intake to 1,500-2,000 mg (milligrams) of sodium each day.  Canned, boxed, and frozen foods are high in sodium. Restaurant foods, fast foods, and pizza are also very high in sodium. You also get sodium by adding salt to food.  Try to cook at home, eat more fresh fruits and vegetables, and eat less fast food, canned, processed, or prepared foods. This information is not intended to replace advice given to you by your health care provider. Make sure you discuss any questions you have with your health care provider. Document Revised: 09/07/2017 Document Reviewed: 09/18/2016 Elsevier Patient Education  2020 [employer].

## 2020-10-09 NOTE — Discharge Summary (Signed)
Physician Discharge Summary  Anna Henson [id number] DOB: 1971-03-06 DOA: 10/08/2020  PCP: Patient, No Pcp Per  Admit date: 10/08/2020 Discharge date: 10/09/2020  Admitted From: home Discharge disposition: home   Recommendations for Outpatient Follow-Up:   1. Lisinopril increased 2. Suspect due to non-compliance with diet 3. BMP at next office visit    Discharge Diagnosis:   Principal Problem:   Chest pain Active Problems:   Hypertensive crisis   Hypokalemia   Transverse myelitis (HCC)   Malignant neoplasm of lower-outer quadrant of right breast of female, estrogen receptor positive (HCC)   MDD (major depressive disorder), recurrent episode, severe (HCC)   Cervical spinal stenosis   Chronic diastolic CHF (congestive heart failure) ([place])    Discharge Condition: Improved.  Diet recommendation: Low sodium, heart healthy  Wound care: None.  Code status: Full.   History of Present Illness:   Anna Henson is a 50 y.o. female with medical history significant for cervical spinal stenosis, history of transverse myelitis, neuropathy, chronic pain, coronary artery disease with stent in 2018, hypertension, and depression, now presenting to the emergency department with chest pain.  Pain began on 10/07/2020, localized to the mid chest, comes and goes but without any alleviating or exacerbating factors identified.  She has had diaphoresis with this but no nausea or shortness of breath.  She has difficulty describing the character, feels that it is somewhat similar to what she experienced with her heart attack, but she was more short of breath and having nausea than unlike now.  She denies any fevers or cough.  No leg swelling or tenderness.  Denies abdominal pain.  ED Course: Upon arrival to the ED, patient is found to be afebrile, saturating well on room air, and hypertensive to 205/87.  EKG features a sinus rhythm and chest x-rays negative for acute cardiopulmonary  disease.  Chemistry panel notable for potassium of 2.9, bicarbonate 19, and anion gap 17.  CBC features a mild leukocytosis and thrombocytosis.  High-sensitivity troponin was 13 and 20 and 25.  Patient was treated with Zofran, IV Lopressor, saline, oral potassium, and oral magnesium in the ED.   Hospital Course by Problem:   Chest pain; CAD  - Presents with 2 days of chest pain, has hx of CAD with DES to RCA in 2018 and had mild non-obstructive LAD/LCx disease also at that time  -suspect due to increased BP  Hypertensive urgency  - SBP as high as 205 in ED, down to 190 after IV Lopressor  - titrate up lisinopril -encourage compliance with diet  Chronic diastolic CHF  - Appears compensated  - Continue beta-blocker and ACE-i   Chronic pain; cervical spinal stenosis  - Hold NSAID, continue other home medications    Hypokalemia  - replaced  Depression  - Continue Effexor   Breast cancer  - 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     The results of significant diagnostics from this hospitalization (including imaging, microbiology, ancillary and laboratory) are listed below for reference.     Procedures and Diagnostic Studies:   DG Chest 2 View  Result Date: 10/08/2020 CLINICAL DATA:  Chest pain today.  Hypertension. EXAM: CHEST - 2 VIEW COMPARISON:  PA and lateral chest 11/14/2019 FINDINGS: Lungs clear. Heart size normal. Coronary artery stent noted. No pneumothorax or pleural effusion.  Surgical clips in the right breast and axilla are again seen. No acute or focal bony abnormality IMPRESSION: No acute disease. Electronically Signed   By: Drusilla Kanner M.D.   On: 10/08/2020 13:17     Labs:   Basic Metabolic Panel: Recent Labs  Lab 10/08/20 1304 10/09/20 0350  NA 134* 137  K 2.9* 3.5  CL 98 101  CO2 19* 24  GLUCOSE 101* 92  BUN 11 14  CREATININE 0.95 1.13*  CALCIUM 10.3 9.8  MG  --  2.3   GFR Estimated Creatinine Clearance: 53.1 mL/min (A) (by C-G formula based on SCr of 1.13 mg/dL (H)). Liver Function Tests: No results for input(s): AST, ALT, ALKPHOS, BILITOT, PROT, ALBUMIN in the last 168 hours. Recent Labs  Lab 10/08/20 2120  LIPASE 24   No results for input(s): AMMONIA in the last 168 hours. Coagulation profile No results for input(s): INR, PROTIME in the last 168 hours.  CBC: Recent Labs  Lab 10/08/20 1304  WBC 11.8*  HGB 16.0*  HCT 44.1  MCV 89.8  PLT 409*   Cardiac Enzymes: No results for input(s): CKTOTAL, CKMB, CKMBINDEX, TROPONINI in the last 168 hours. BNP: Invalid input(s): POCBNP CBG: No results for input(s): GLUCAP in the last 168 hours. D-Dimer No results for input(s): DDIMER in the last 72 hours. Hgb A1c No results for input(s): HGBA1C in the last 72 hours. Lipid Profile Recent Labs    10/09/20 0350  CHOL 226*  HDL 29*  LDLCALC 161*  TRIG 181*  CHOLHDL 7.8   Thyroid function studies No results for input(s): TSH, T4TOTAL, T3FREE, THYROIDAB in the last 72 hours.  Invalid input(s): FREET3 Anemia work up No results for input(s): VITAMINB12, FOLATE, FERRITIN, TIBC, IRON, RETICCTPCT in the last 72 hours. Microbiology Recent Results (from the past 240 hour(s))  Resp Panel by RT-PCR (Flu A&B, Covid) Nasopharyngeal Swab     Status: None   Collection Time: 10/08/20  8:18 PM   Specimen: Nasopharyngeal Swab; Nasopharyngeal(NP) swabs in vial transport medium  Result Value Ref Range Status   SARS Coronavirus 2 by RT PCR  NEGATIVE NEGATIVE Final    Comment: (NOTE) SARS-CoV-2 target nucleic acids are NOT DETECTED.  The SARS-CoV-2 RNA is generally detectable in upper respiratory specimens during the acute phase of infection. The lowest concentration of SARS-CoV-2 viral copies this assay can detect is 138 copies/mL. A negative result does not preclude SARS-Cov-2 infection and should not be used as the sole basis for treatment or other patient management decisions. A negative result may occur with  improper specimen collection/handling, submission of specimen other than nasopharyngeal swab, presence of viral mutation(s) within the areas targeted by this assay, and inadequate number of viral copies(<138 copies/mL). A negative result must be combined with clinical observations, patient history, and epidemiological information. The expected result is Negative.  Fact Sheet for Patients:  BloggerCourse.com  Fact Sheet for Healthcare Providers:  SeriousBroker.it  This test is no t yet approved or cleared by the [place] and  has been authorized for detection and/or diagnosis of SARS-CoV-2 by FDA under an Emergency Use Authorization (EUA). This EUA will remain  in effect (meaning this test can be used) for the duration of the COVID-19 declaration under Section 564(b)(1) of the Act, 21 U.S.C.section 360bbb-3(b)(1), unless the authorization is terminated  or revoked sooner.       Influenza A by PCR NEGATIVE NEGATIVE Final   Influenza B by PCR NEGATIVE NEGATIVE Final    Comment: (NOTE) The Xpert Xpress SARS-CoV-2/FLU/RSV plus assay is intended as an aid in the diagnosis of influenza from Nasopharyngeal swab specimens and should not be used as a sole basis for treatment. Nasal washings and aspirates are unacceptable for Xpert Xpress SARS-CoV-2/FLU/RSV testing.  Fact Sheet for Patients: BloggerCourse.com  Fact Sheet for  Healthcare Providers: SeriousBroker.it  This test is not yet approved or cleared by the [place] FDA and has been authorized for detection and/or diagnosis of SARS-CoV-2 by FDA under an Emergency Use Authorization (EUA). This EUA will remain in effect (meaning this test can be used) for the duration of the COVID-19 declaration under Section 564(b)(1) of the Act, 21 U.S.C. section 360bbb-3(b)(1), unless the authorization is terminated or revoked.  Performed at [facility], [address] [place], [place] [address]      Discharge Instructions:   Discharge Instructions    Diet - low sodium heart healthy   Complete by: As directed    Increase activity slowly   Complete by: As directed      Allergies as of 10/09/2020      Reactions   Pork-derived Products Other (See Comments)   Does NOT eat pork      Medication List    STOP taking these medications   celecoxib 200 MG capsule Commonly known as: CELEBREX   methylPREDNISolone 4 MG tablet Commonly known as: MEDROL     TAKE these medications   acetaminophen 325 MG tablet Commonly known as: TYLENOL Take 325-650 mg by mouth every 8 (eight) hours as needed for moderate pain.   amLODipine 10 MG tablet Commonly known as: NORVASC TAKE 1 TABLET(10 MG) BY MOUTH DAILY What changed: See the new instructions.   anastrozole 1 MG tablet Commonly known as: ARIMIDEX Take 1 tablet (1 mg total) by mouth daily.   aspirin 81 MG chewable tablet Chew 1 tablet (81 mg total) by mouth daily.   atorvastatin 80 MG tablet Commonly known as: LIPITOR TAKE 1 TABLET(80 MG) BY MOUTH DAILY What changed: See the new instructions.   baclofen 10 MG tablet Commonly known as: LIORESAL TAKE BY MOUTH UP TO 4 TABLETS EVERY DAY FOR MUSCLE SPASMS What changed: See the new instructions.   etodolac 400 MG tablet Commonly known as: LODINE Take 1 tablet (400 mg total) by mouth 2 (two) times daily.   ferrous sulfate  325 (65 FE) MG tablet Take 325 mg by mouth daily.   fluticasone 50 MCG/ACT nasal spray Commonly known as: FLONASE Place 1-2 sprays into both nostrils daily as needed for allergies or rhinitis.   furosemide 20 MG tablet Commonly known as: LASIX Take 1 tablet (20 mg total) by mouth daily as needed (fluid retention.). What changed: reasons to take this   gabapentin 300 MG capsule Commonly known as: NEURONTIN Take 900mg  (3 capsules) by mouth three times daily What changed:   how much to take  how to take  this  when to take this  additional instructions   hydrochlorothiazide 25 MG tablet Commonly known as: HYDRODIURIL Take 1 tablet (25 mg total) by mouth daily.   lisinopril 20 MG tablet Commonly known as: ZESTRIL TAKE 1 TABLET(20 MG) BY MOUTH TWICE DAILY What changed: See the new instructions.   methocarbamol 500 MG tablet Commonly known as: ROBAXIN TAKE 1 TABLET(500 MG) BY MOUTH THREE TIMES DAILY What changed: See the new instructions.   metoprolol tartrate 25 MG tablet Commonly known as: LOPRESSOR Take 1 tablet (25 mg total) by mouth 2 (two) times daily.   nitroGLYCERIN 0.4 MG SL tablet Commonly known as: NITROSTAT Place 1 tablet (0.4 mg total) under the tongue every 5 (five) minutes as needed for chest pain.   Oxcarbazepine 300 MG tablet Commonly known as: TRILEPTAL Take one pill qAM and two pills po qHS What changed:   how much to take  how to take this  when to take this  additional instructions   oxybutynin 10 MG 24 hr tablet Commonly known as: Ditropan XL Take 1 tablet (10 mg total) by mouth at bedtime.   traMADol 50 MG tablet Commonly known as: ULTRAM TAKE 1 TABLET(50 MG) BY MOUTH TWICE DAILY AS NEEDED What changed:   how much to take  how to take this  when to take this  reasons to take this  additional instructions   venlafaxine XR 75 MG 24 hr capsule Commonly known as: EFFEXOR-XR TAKE 1 CAPSULE(75 MG) BY MOUTH DAILY WITH  BREAKFAST What changed: See the new instructions.       Follow-up Information    Hilty, Nadean Corwin, MD Follow up in 1 week(s).   Specialty: Cardiology Why: for BP check Contact information: [address] [place] [id number]                Time coordinating discharge: 25 min  Signed:  Geradine Girt DO  [facility] 10/09/2020, 9:56 AM

## 2020-10-09 NOTE — Plan of Care (Signed)

## 2020-10-13 NOTE — Progress Notes (Incomplete)
Patient Care Team: Patient, No Pcp Per as PCP - General (General Practice) Hilty, Nadean Corwin, MD as PCP - Cardiology (Cardiology) Sater, Nanine Means, MD as Consulting Physician (Neurology) Jovita Kussmaul, MD as Consulting Physician (General Surgery) Nicholas Lose, MD as Consulting Physician (Hematology and Oncology) Kyung Rudd, MD as Consulting Physician (Radiation Oncology)  DIAGNOSIS: No diagnosis found.  SUMMARY OF ONCOLOGIC HISTORY: Oncology History  Malignant neoplasm of lower-outer quadrant of right breast of female, estrogen receptor positive ([place])  03/26/2017 Initial Diagnosis   Palpable right breast mass with a normal mammogram: 8:00 position by ultrasound 3.2 cm irregular mass: Grade 2 invasive lobular cancer ER 85%, PR 100%, Ki-67 2%, HER-2 negative ratio 1.5, T2 N0 stage II a clinical stage   04/20/2017 Genetic Testing   Genetic counseling and testing for hereditary cancer syndromes performed on . Results are negative for pathogenic mutations in 46 genes analyzed by [facility] Common Hereditary Cancers Panel. Results are dated . Genes tested: APC, ATM, AXIN2, BARD1, BMPR1A, BRCA1, BRCA2, BRIP1, CDH1, CDKN2A, CHEK2, CTNNA1, DICER1, EPCAM, GREM1, HOXB13, KIT, MEN1, MLH1, MSH2, MSH3, MSH6, MUTYH, NBN, NF1, NTHL1, PALB2, PDGFRA, PMS2, POLD1, POLE, PTEN, RAD50, RAD51C, RAD51D, SDHA, SDHB, SDHC, SDHD, SMAD4, SMARCA4, STK11, TP53, TSC1, TSC2, and VHL.  Variants of uncertain significance (not clinically actionable) were noted in APC and BRIP1.      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 with above-mentioned history of right breast cancer treated with lumpectomy,radiation,and whois currently on tamoxifen. Mammogram on 05/18/20 showed a 1.1cm group of calcifications in the right breast suspicious for malignancy. Biopsy on 06/21/20 showed no evidence of malignancy. She presents to the clinic todayfor follow-up.   ALLERGIES:  is allergic to pork-derived products.  MEDICATIONS:  Current Outpatient Medications  Medication Sig Dispense Refill  . acetaminophen (TYLENOL) 325 MG tablet Take 325-650 mg by mouth every 8 (eight) hours as needed for moderate pain.    Marland Kitchen amLODipine (NORVASC) 10 MG tablet TAKE 1 TABLET(10 MG) BY MOUTH DAILY (Patient taking differently: Take 10 mg by mouth daily.) 90 tablet 2  . anastrozole (ARIMIDEX) 1 MG tablet Take 1 tablet (1 mg total) by mouth daily. 90 tablet 0  . aspirin 81 MG chewable tablet Chew 1 tablet (81 mg total) by mouth daily.    Marland Kitchen atorvastatin (LIPITOR) 80 MG tablet TAKE 1 TABLET(80 MG) BY MOUTH DAILY (Patient taking differently: Take 80 mg by mouth daily.) 90 tablet 1  . baclofen (LIORESAL) 10 MG tablet TAKE BY MOUTH UP TO 4 TABLETS EVERY DAY FOR MUSCLE SPASMS (Patient taking differently: Take 10 mg by mouth 4 (four) times daily as needed for muscle spasms.) 120 tablet 5  . etodolac (LODINE) 400 MG tablet Take 1 tablet (400 mg total) by mouth 2 (two) times daily. 60 tablet 11  . ferrous sulfate 325 (65 FE) MG tablet Take 325 mg by mouth daily.    . fluticasone (FLONASE)  50 MCG/ACT nasal spray Place 1-2 sprays into both nostrils daily as needed for allergies or rhinitis.    . furosemide (LASIX) 20 MG tablet Take 1 tablet (20 mg total) by mouth daily as needed (fluid retention.). (Patient taking differently: Take 20 mg by mouth daily as needed for fluid (fluid retention.).) 90 tablet 0  . gabapentin (NEURONTIN) 300 MG capsule Take 930m (3  capsules) by mouth three times daily (Patient taking differently: Take 900 mg by mouth 3 (three) times daily.) 270 capsule 5  . hydrochlorothiazide (HYDRODIURIL) 25 MG tablet Take 1 tablet (25 mg total) by mouth daily. 90 tablet 3  . lisinopril (ZESTRIL) 40 MG tablet Take 1 tablet (40 mg total) by mouth daily. 30 tablet 0  . methocarbamol (ROBAXIN) 500 MG tablet TAKE 1 TABLET(500 MG) BY MOUTH THREE TIMES DAILY (Patient taking differently: Take 500 mg by mouth 3 (three) times daily.) 90 tablet 0  . metoprolol tartrate (LOPRESSOR) 25 MG tablet Take 1 tablet (25 mg total) by mouth 2 (two) times daily. 180 tablet 3  . nitroGLYCERIN (NITROSTAT) 0.4 MG SL tablet Place 1 tablet (0.4 mg total) under the tongue every 5 (five) minutes as needed for chest pain. 25 tablet 4  . OXcarbazepine (TRILEPTAL) 300 MG tablet Take one pill qAM and two pills po qHS (Patient taking differently: Take 300-600 mg by mouth See admin instructions. Take one pill in the morning and two pills at bedtime) 90 tablet 11  . oxybutynin (DITROPAN XL) 10 MG 24 hr tablet Take 1 tablet (10 mg total) by mouth at bedtime. 30 tablet 5  . traMADol (ULTRAM) 50 MG tablet TAKE 1 TABLET(50 MG) BY MOUTH TWICE DAILY AS NEEDED (Patient taking differently: Take 50 mg by mouth every 12 (twelve) hours as needed for moderate pain.) 60 tablet 5  . venlafaxine XR (EFFEXOR-XR) 75 MG 24 hr capsule TAKE 1 CAPSULE(75 MG) BY MOUTH DAILY WITH BREAKFAST (Patient taking differently: Take 75 mg by mouth daily.) 90 capsule 0   No current facility-administered medications for this visit.    PHYSICAL EXAMINATION: ECOG PERFORMANCE STATUS: {CHL ONC ECOG PS:[id number]}  There were no vitals filed for this visit. There were no vitals filed for this visit.  BREAST:*** No palpable masses or nodules in either right or left breasts. No palpable axillary supraclavicular or infraclavicular adenopathy no breast tenderness or nipple discharge. (exam performed in the presence  of a chaperone)  LABORATORY DATA:  I have reviewed the data as listed CMP Latest Ref Rng & Units 10/09/2020 10/08/2020 03/23/2020  Glucose 70 - 99 mg/dL 92 101(H) 103(H)  BUN 6 - 20 mg/dL _0 Creatinine 0.44 - 1.00 mg/dL 1.13(H) 0.95 0.80  Sodium 135 - 145 mmol/L 137 134(L) 138  Potassium 3.5 - 5.1 mmol/L 3.5 2.9(L) 3.6  Chloride 98 - 111 mmol/L 101 98 105  CO2 22 - 32 mmol/L 24 19(L) 24  Calcium 8.9 - 10.3 mg/dL 9.8 10.3 9.0  Total Protein 6.5 - 8.1 g/dL - - 7.0  Total Bilirubin 0.3 - 1.2 mg/dL - - 0.4  Alkaline Phos 38 - 126 U/L - - 54  AST 15 - 41 U/L - - 21  ALT 0 - 44 U/L - - 15    Lab Results  Component Value Date   WBC 11.8 (H) 10/08/2020   HGB 16.0 (H) 10/08/2020   HCT 44.1 10/08/2020   MCV 89.8 10/08/2020   PLT 409 (H) 10/08/2020   NEUTROABS 7.1 (H) 04/01/2018  ASSESSMENT & PLAN:  No problem-specific Assessment & Plan notes found for this encounter.    No orders of the defined types were placed in this encounter.  The patient has a good understanding of the overall plan. she agrees with it. she will call with any problems that may develop before the next visit here.  Total time spent: *** mins including face to face time and time spent for planning, charting and coordination of care  Nicholas Lose, MD 10/13/2020  I, Cloyde Reams Dorshimer, am acting as scribe for Dr. Nicholas Lose.  {insert scribe attestation}

## 2020-10-14 ENCOUNTER — Observation Stay (HOSPITAL_COMMUNITY)
Admission: EM | Admit: 2020-10-14 | Discharge: 2020-10-16 | Disposition: A | Discharge status: Home / Self Care | Payer: 59 | Attending: Internal Medicine | Admitting: Internal Medicine

## 2020-10-14 ENCOUNTER — Emergency Department (HOSPITAL_COMMUNITY): Payer: 59

## 2020-10-14 ENCOUNTER — Other Ambulatory Visit: Payer: Self-pay

## 2020-10-14 ENCOUNTER — Ambulatory Visit: Payer: 59 | Admitting: Hematology and Oncology

## 2020-10-14 ENCOUNTER — Ambulatory Visit: Payer: Self-pay | Admitting: *Deleted

## 2020-10-14 DIAGNOSIS — Z7982 Long term (current) use of aspirin: Secondary | ICD-10-CM | POA: Insufficient documentation

## 2020-10-14 DIAGNOSIS — G373 Acute transverse myelitis in demyelinating disease of central nervous system: Secondary | ICD-10-CM | POA: Diagnosis present

## 2020-10-14 DIAGNOSIS — I130 Hypertensive heart and chronic kidney disease with heart failure and stage 1 through stage 4 chronic kidney disease, or unspecified chronic kidney disease: Secondary | ICD-10-CM | POA: Diagnosis not present

## 2020-10-14 DIAGNOSIS — Z20822 Contact with and (suspected) exposure to covid-19: Secondary | ICD-10-CM | POA: Diagnosis not present

## 2020-10-14 DIAGNOSIS — Z87891 Personal history of nicotine dependence: Secondary | ICD-10-CM | POA: Insufficient documentation

## 2020-10-14 DIAGNOSIS — I5032 Chronic diastolic (congestive) heart failure: Secondary | ICD-10-CM | POA: Diagnosis present

## 2020-10-14 DIAGNOSIS — R0789 Other chest pain: Secondary | ICD-10-CM | POA: Diagnosis present

## 2020-10-14 DIAGNOSIS — Z853 Personal history of malignant neoplasm of breast: Secondary | ICD-10-CM | POA: Diagnosis not present

## 2020-10-14 DIAGNOSIS — I25119 Atherosclerotic heart disease of native coronary artery with unspecified angina pectoris: Secondary | ICD-10-CM | POA: Diagnosis not present

## 2020-10-14 DIAGNOSIS — Z79899 Other long term (current) drug therapy: Secondary | ICD-10-CM | POA: Insufficient documentation

## 2020-10-14 DIAGNOSIS — I159 Secondary hypertension, unspecified: Secondary | ICD-10-CM | POA: Diagnosis not present

## 2020-10-14 DIAGNOSIS — E7800 Pure hypercholesterolemia, unspecified: Secondary | ICD-10-CM

## 2020-10-14 DIAGNOSIS — C50511 Malignant neoplasm of lower-outer quadrant of right female breast: Secondary | ICD-10-CM

## 2020-10-14 DIAGNOSIS — Z170 Estrogen receptor positive status [ER+]: Secondary | ICD-10-CM | POA: Diagnosis not present

## 2020-10-14 DIAGNOSIS — N183 Chronic kidney disease, stage 3 unspecified: Secondary | ICD-10-CM | POA: Insufficient documentation

## 2020-10-14 DIAGNOSIS — Z9861 Coronary angioplasty status: Secondary | ICD-10-CM | POA: Diagnosis not present

## 2020-10-14 DIAGNOSIS — R079 Chest pain, unspecified: Secondary | ICD-10-CM | POA: Diagnosis not present

## 2020-10-14 DIAGNOSIS — I160 Hypertensive urgency: Secondary | ICD-10-CM | POA: Diagnosis present

## 2020-10-14 DIAGNOSIS — E785 Hyperlipidemia, unspecified: Secondary | ICD-10-CM | POA: Diagnosis present

## 2020-10-14 DIAGNOSIS — I150 Renovascular hypertension: Secondary | ICD-10-CM

## 2020-10-14 DIAGNOSIS — I422 Other hypertrophic cardiomyopathy: Secondary | ICD-10-CM

## 2020-10-14 DIAGNOSIS — I10 Essential (primary) hypertension: Secondary | ICD-10-CM

## 2020-10-14 DIAGNOSIS — E871 Hypo-osmolality and hyponatremia: Secondary | ICD-10-CM | POA: Diagnosis not present

## 2020-10-14 DIAGNOSIS — I2510 Atherosclerotic heart disease of native coronary artery without angina pectoris: Secondary | ICD-10-CM | POA: Diagnosis present

## 2020-10-14 DIAGNOSIS — R519 Headache, unspecified: Secondary | ICD-10-CM | POA: Insufficient documentation

## 2020-10-14 LAB — CBC
HCT: 37.4 % (ref 36.0–46.0)
Hemoglobin: 13.7 g/dL (ref 12.0–15.0)
MCH: 33 pg (ref 26.0–34.0)
MCHC: 36.6 g/dL — ABNORMAL HIGH (ref 30.0–36.0)
MCV: 90.1 fL (ref 80.0–100.0)
Platelets: 317 10*3/uL (ref 150–400)
RBC: 4.15 MIL/uL (ref 3.87–5.11)
RDW: 13.2 % (ref 11.5–15.5)
WBC: 8.5 10*3/uL (ref 4.0–10.5)
nRBC: 0 % (ref 0.0–0.2)

## 2020-10-14 LAB — I-STAT BETA HCG BLOOD, ED (MC, WL, AP ONLY): I-stat hCG, quantitative: <5 m[IU]/mL (ref <5)

## 2020-10-14 LAB — BASIC METABOLIC PANEL
Anion gap: 10 (ref 5–15)
BUN: 9 mg/dL (ref 6–20)
CO2: 25 mmol/L (ref 22–32)
Calcium: 9.2 mg/dL (ref 8.9–10.3)
Chloride: 91 mmol/L — ABNORMAL LOW (ref 98–111)
Creatinine, Ser: 0.97 mg/dL (ref 0.44–1.00)
GFR, Estimated: >60 mL/min (ref >60)
Glucose, Bld: 108 mg/dL — ABNORMAL HIGH (ref 70–99)
Potassium: 3.6 mmol/L (ref 3.5–5.1)
Sodium: 126 mmol/L — ABNORMAL LOW (ref 135–145)

## 2020-10-14 LAB — COMPREHENSIVE METABOLIC PANEL
ALT: 24 U/L (ref 0–44)
AST: 24 U/L (ref 15–41)
Albumin: 3 g/dL — ABNORMAL LOW (ref 3.5–5.0)
Alkaline Phosphatase: 72 U/L (ref 38–126)
Anion gap: 8 (ref 5–15)
BUN: 5 mg/dL — ABNORMAL LOW (ref 6–20)
CO2: 25 mmol/L (ref 22–32)
Calcium: 7.8 mg/dL — ABNORMAL LOW (ref 8.9–10.3)
Chloride: 102 mmol/L (ref 98–111)
Creatinine, Ser: 0.85 mg/dL (ref 0.44–1.00)
GFR, Estimated: >60 mL/min (ref >60)
Glucose, Bld: 77 mg/dL (ref 70–99)
Potassium: 3 mmol/L — ABNORMAL LOW (ref 3.5–5.1)
Sodium: 135 mmol/L (ref 135–145)
Total Bilirubin: 0.3 mg/dL (ref 0.3–1.2)
Total Protein: 5.2 g/dL — ABNORMAL LOW (ref 6.5–8.1)

## 2020-10-14 LAB — PHOSPHORUS: Phosphorus: 2 mg/dL — ABNORMAL LOW (ref 2.5–4.6)

## 2020-10-14 LAB — TROPONIN I (HIGH SENSITIVITY)
Troponin I (High Sensitivity): 12 ng/L (ref <18)
Troponin I (High Sensitivity): 13 ng/L (ref <18)

## 2020-10-14 LAB — BRAIN NATRIURETIC PEPTIDE: B Natriuretic Peptide: 30.8 pg/mL (ref 0.0–100.0)

## 2020-10-14 LAB — OSMOLALITY: Osmolality: 276 mOsm/kg (ref 275–295)

## 2020-10-14 LAB — CK: Total CK: 102 U/L (ref 38–234)

## 2020-10-14 LAB — MAGNESIUM: Magnesium: 1.8 mg/dL (ref 1.7–2.4)

## 2020-10-14 MED ORDER — POTASSIUM CHLORIDE 10 MEQ/100ML IV SOLN
10.0000 meq | INTRAVENOUS | Status: AC
Start: 2020-10-14 — End: 2020-10-15
  Administered 2020-10-15 (×4): 10 meq via INTRAVENOUS
  Filled 2020-10-14 (×4): qty 100

## 2020-10-14 MED ORDER — ACETAMINOPHEN 325 MG PO TABS
650.0000 mg | ORAL_TABLET | Freq: Once | ORAL | Status: AC
  Administered 2020-10-14: 650 mg via ORAL
  Filled 2020-10-14: qty 2

## 2020-10-14 MED ORDER — KETOROLAC TROMETHAMINE 30 MG/ML IJ SOLN
15.0000 mg | Freq: Once | INTRAMUSCULAR | Status: AC
  Administered 2020-10-14: 15 mg via INTRAVENOUS
  Filled 2020-10-14: qty 1

## 2020-10-14 MED ORDER — METOPROLOL TARTRATE 50 MG PO TABS
50.0000 mg | ORAL_TABLET | Freq: Two times a day (BID) | ORAL | Status: DC
  Administered 2020-10-15 – 2020-10-16 (×4): 50 mg via ORAL
  Filled 2020-10-14: qty 2
  Filled 2020-10-14: qty 1
  Filled 2020-10-14 (×4): qty 2

## 2020-10-14 MED ORDER — SODIUM CHLORIDE 0.9 % IV BOLUS
500.0000 mL | Freq: Once | INTRAVENOUS | Status: AC
  Administered 2020-10-14: 500 mL via INTRAVENOUS

## 2020-10-14 MED ORDER — POTASSIUM PHOSPHATES 15 MMOLE/5ML IV SOLN
10.0000 mmol | Freq: Once | INTRAVENOUS | Status: AC
  Administered 2020-10-15: 10 mmol via INTRAVENOUS
  Filled 2020-10-14: qty 3.33

## 2020-10-14 MED ORDER — LABETALOL HCL 5 MG/ML IV SOLN
20.0000 mg | Freq: Once | INTRAVENOUS | Status: DC
  Filled 2020-10-14: qty 4

## 2020-10-14 NOTE — Telephone Encounter (Signed)
Patient is calling to report she is having high BP with severe headache. Advised ED for evaluation.  Reason for Disposition . [1] Systolic BP  >= 160 OR Diastolic >= 100 AND [2] cardiac or neurologic symptoms (e.g., chest pain, difficulty breathing, unsteady gait, blurred vision)  Answer Assessment - Initial Assessment Questions 1. BLOOD PRESSURE: "What is the blood pressure?" "Did you take at least two measurements 5 minutes apart?"     172/113- 8:33, 171/109-early am 2. ONSET: "When did you take your blood pressure?"     12/31 was admitted- released Monday 3. HOW: "How did you obtain the blood pressure?" (e.g., visiting nurse, automatic home BP monitor)     automatic arm 4. HISTORY: "Do you have a history of high blood pressure?"     Yes- cardiac stints hx,heart attack 5. MEDICATIONS: "Are you taking any medications for blood pressure?" "Have you missed any doses recently?"     Metoprolol 40mg  , amlodipine, lisinopril, ASA- no missed doses  6. OTHER SYMPTOMS: "Do you have any symptoms?" (e.g., headache, chest pain, blurred vision, difficulty breathing, weakness)     Headache,chest pain- once 7. PREGNANCY: "Is there any chance you are pregnant?" "When was your last menstrual period?"     n/a  Protocols used: BLOOD PRESSURE - HIGH-A-AH

## 2020-10-14 NOTE — Assessment & Plan Note (Deleted)
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, 5 cm, LCIS, 0/5 lymph nodes negative, ER 85%, PR 100%, Ki-67 2%, HER-2 negative ratio 1.5, T2 N0 stage II a AJCC 8  Oncotype Dx 16 ROR 10% Adj XRT 08/29/17-09/25/17 ----------------------------------------------------------------------------------------------------------------------------------- Current treatment: Adjuvant antiestrogen therapy with tamoxifen 20 mg daily stopped in June 2021, switched to anastrozole 1 mg daily 04/28/2020 after BSO surgery   Anastrozole toxicities:  Breast cancer surveillance: 1.Breast exam 04/28/2020: Benign 2.Mammogram: 03/27/2019 benign  Hospitalization 10/08/2020-10/09/2020: Chest pain (due to severe hypertension) Return to clinicin 1 year for follow-up

## 2020-10-14 NOTE — ED Provider Notes (Signed)
[facility] EMERGENCY DEPARTMENT Provider Note   CSN: [id number] Arrival date & time: 10/14/20  6384     History Chief Complaint  Patient presents with  . Chest Pain    Anna Henson is a 50 y.o. female.  HPI Patient returns.  Recently in the hospital for hypertension and CHF.  Has had some chest pain and headache.  States she gets this way when her blood pressure goes up.  States headache is severe.  Typical from when her blood pressure goes up.  States blood pressure readings at home are 665-993 systolic.  Complains of dull chest pain at times.  Has had previous MI.  No numbness or weakness.  No diaphoresis.  No nausea.    Past Medical History:  Diagnosis Date  . Anemia   . Anxiety   . Bipolar disorder in full remission ([place])   . CAD in native artery cardiologist--  dr hilty   a. 09/2014 Cath/PCI in setting of [place] - s/p 2.25 x 12 mm Promus Premier DES to mid RCA;  b. 11/2016 Myoview: EF 56%, no ischemia;  c. 12/2016 NSTEMI/PCI: LM nl, LAd 25p, LCX 30p, RCA 100p (2.75x38 Promus Premier DES), mRCA 10 ISR, EF 50-55%.  . CKD (chronic kidney disease), stage III ([place])   . Depression   . Genetic testing 04/23/2017   Ms. Duran underwent genetic counseling and testing for hereditary cancer syndromes on 04/05/2017. Her results were negative for mutations in all 46 genes analyzed by [employer]'s 46-gene Common Hereditary Cancers Panel. Genes analyzed include: APC, ATM, AXIN2, BARD1, BMPR1A, BRCA1, BRCA2, BRIP1, CDH1, CDKN2A, CHEK2, CTNNA1, DICER1, EPCAM, GREM1, HOXB13, KIT, MEN1, MLH1, MSH2, MSH3, MSH6, MUTYH, NBN,  . GERD (gastroesophageal reflux disease)   . Headache   . History of external beam radiation therapy    right breast 07-27-2017  to 09-25-2017  . History of non-ST elevation myocardial infarction (NSTEMI)   . HOCM (hypertrophic obstructive cardiomyopathy) ([place]) followed by cardiology   a. 12/2016 Echo: EF 65-70%,  mod conc LVH, dynamic obstruction @ rest, peak  velocity of 291 cm/sec w/ peak gradient of 66mHg, no rwma, Gr1 DD, triv TR, PASP 134mg.  . Marland Kitchenyperlipidemia   . Hypertension   . Malignant neoplasm of lower-outer quadrant of right breast of female, estrogen receptor positive ([facility] dr guLindi Adie dx 06/ 2018--- right breast invasive lobular carcinoma, ductal carcinoma w/ LCIS---- 06-21-2017 s/p right breast lumpecotmy w/ sln disseciton;   completed radiation 09-25-2017;  on tamoxifen  . Myocardial infarction (HCR[place]   2017  . S/P drug eluting coronary stent placement    09-14-2014---  [facility]  PCI with DES x1 to proxRCA  . Transverse myelitis ([facility] dr saFelecia Shelling 01/ 2017  dx transverse myelitis w/ right side numbness (09-01-2019  currently lower extremity weakness, mucsle spasms, and gait disturbance)  . Wears glasses     Patient Active Problem List   Diagnosis Date Noted  . Chronic diastolic CHF (congestive heart failure) (HCFort Bend12/31/2021  . Neuropathic pain 07/06/2020  . Cervical spinal stenosis 06/15/2020  . Lhermitte's sign positive 06/15/2020  . Labial cyst   . Complex cyst of right ovary 12/05/2019  . Hypertensive urgency 07/28/2018  . Chronic anemia   . MDD (major depressive disorder), recurrent episode, severe (HC[facility]anch[place]11/20/2018  . Neck stiffness 08/22/2017  . Abnormal finding on MRI of brain 08/22/2017  . HOCM (hypertrophic obstructive cardiomyopathy) (HCD[place]  . Chest  pain 05/06/2017  . Genetic testing 04/23/2017  . Malignant neoplasm of lower-outer quadrant of right breast of female, estrogen receptor positive ([place]) 03/29/2017  . Dysesthesia 03/12/2017  . Palpitations 02/23/2017  . S/P cardiac cath (12/2016) a. acute total occlusion of the RCA tx w/PCI DES, b. mild nonobs LAD/LCx stenosis, c. mild segmental LV sys dx w/ sev hypokinesis, LVEF 50-55% 01/02/2017  . Non-ST elevation myocardial infarction (NSTEMI), subsequent episode of care ([place]) 12/31/2016  . Malignant  hypertension   . Chronic kidney disease   . Abnormal EKG   . Nausea   . Ischemic cardiomyopathy   . Muscle spasms of both lower extremities 04/17/2016  . Gait disturbance 01/25/2016  . Transverse myelitis ([place]) 12/14/2015  . Acute-on-chronic kidney injury ([place]) 11/12/2015  . Neck pain 11/12/2015  . Hypokalemia 11/12/2015  . Abnormal MRI, neck 11/12/2015  . Numbness 11/12/2015  . Hypertensive crisis   . Hyperlipidemia   . Coronary artery disease involving native coronary artery of native heart without angina pectoris   . Facial numbness   . Right arm numbness   . Throat pain 11/09/2015  . Drooling 11/09/2015  . CAD (coronary artery disease), native coronary artery 09/15/2014  . Headache 09/15/2014  . UTI (urinary tract infection): Probable 09/14/2014  . Candida infection of genital region 09/14/2014  . Unstable angina ([place]) 09/13/2014  . HLD (hyperlipidemia) 09/13/2014  . Otitis 09/13/2014  . ACS (acute coronary syndrome) ([place]) 09/13/2014  . Tobacco abuse 09/13/2014  . Essential hypertension 09/09/2014    Past Surgical History:  Procedure Laterality Date  . BREAST LUMPECTOMY WITH RADIOACTIVE SEED AND SENTINEL LYMPH NODE BIOPSY Right 06/21/2017   Procedure: RIGHT BREAST BRACKETED SEED GUIDED LUMPECTOMY AND SENTINEL LYMPH NODE BIOPSY;  Surgeon: Jovita Kussmaul, MD;  Location: [place];  Service: General;  Laterality: Right;  . CORONARY/GRAFT ACUTE MI REVASCULARIZATION N/A 12/31/2016   Procedure: Coronary/Graft Acute MI Revascularization;  Surgeon: Sherren Mocha, MD;  Location: [place] CV LAB;  Service: Cardiovascular;  Laterality: N/A;  . DILATATION & CURETTAGE/HYSTEROSCOPY WITH MYOSURE N/A 09/02/2019   Procedure: DILATATION & CURETTAGE/HYSTEROSCOPY WITH MYOSURE;  Surgeon: Aloha Gell, MD;  Location: [place];  Service: Gynecology;  Laterality: N/A;  . HERNIA REPAIR    . LEFT HEART CATH AND CORONARY ANGIOGRAPHY N/A 12/31/2016   Procedure: Left Heart Cath and  Coronary Angiography;  Surgeon: Sherren Mocha, MD;  Location: [place] CV LAB;  Service: Cardiovascular;  Laterality: N/A;  . LEFT HEART CATHETERIZATION WITH CORONARY ANGIOGRAM N/A 09/14/2014   Procedure: LEFT HEART CATHETERIZATION WITH CORONARY ANGIOGRAM;  Surgeon: Burnell Blanks, MD;  Location: [facility] CATH LAB;  Service: Cardiovascular;  Laterality: N/A;  . MASS EXCISION N/A 03/30/2020   Procedure: EXCISION MASS RIGHT LABIA MINORA;  Surgeon: Lafonda Mosses, MD;  [facility];  Service: Gynecology;  Laterality: N/A;  . PERCUTANEOUS CORONARY STENT INTERVENTION (PCI-S)  09/14/2014   Procedure: PERCUTANEOUS CORONARY STENT INTERVENTION (PCI-S);  Surgeon: Burnell Blanks, MD;  Location[facility] CATH LAB;  Service: Cardiovascular;;  . ROBOTIC ASSISTED SALPINGO OOPHERECTOMY Bilateral 03/30/2020   Procedure: XI ROBOTIC ASSISTED SALPINGO OOPHORECTOMY;  Surgeon: Lafonda Mosses, MD;  Location: [facility];  Service: Gynecology;  Laterality: Bilateral;  . TONSILLECTOMY  2005  . UMBILICAL HERNIA REPAIR  2001; 2005     OB History   No obstetric history on file.     Family History  Problem Relation Age of Onset  . Diabetes Mother   . Heart disease Mother   .  Hyperlipidemia Mother   . Hypertension Mother   . Breast cancer Mother   . Lung cancer Father   . Drug abuse Father   . Brain cancer Father 12  . Bone cancer Father 8  . Drug abuse Sister   . Mental illness Brother   . Mental illness Maternal Grandmother   . Stomach cancer Paternal Grandmother 53       d.75    Social History   Tobacco Use  . Smoking status: Former Smoker    Packs/day: 0.50    Years: 30.00    Pack years: 15.00    Types: Cigarettes    Quit date: 10/05/2015    Years since quitting: 5.0  . Smokeless tobacco: Never Used  Vaping Use  . Vaping Use: Former  Substance Use Topics  . Alcohol use: Yes    Alcohol/week: 2.0 standard drinks    Types: 2 Cans of beer per week    Comment: occas.  . Drug use:  No    Home Medications Prior to Admission medications   Medication Sig Start Date End Date Taking? Authorizing Provider  acetaminophen (TYLENOL) 325 MG tablet Take 325-650 mg by mouth every 8 (eight) hours as needed for moderate pain.    [provider]  amLODipine (NORVASC) 10 MG tablet TAKE 1 TABLET(10 MG) BY MOUTH DAILY Patient taking differently: Take 10 mg by mouth daily. 09/23/20   Hilty, Nadean Corwin, MD  anastrozole (ARIMIDEX) 1 MG tablet Take 1 tablet (1 mg total) by mouth daily. 09/01/20   Nicholas Lose, MD  aspirin 81 MG chewable tablet Chew 1 tablet (81 mg total) by mouth daily. 09/16/14   Nita Sells, MD  atorvastatin (LIPITOR) 80 MG tablet TAKE 1 TABLET(80 MG) BY MOUTH DAILY Patient taking differently: Take 80 mg by mouth daily. 07/01/20   Hilty, Nadean Corwin, MD  baclofen (LIORESAL) 10 MG tablet TAKE BY MOUTH UP TO 4 TABLETS EVERY DAY FOR MUSCLE SPASMS Patient taking differently: Take 10 mg by mouth 4 (four) times daily as needed for muscle spasms. 09/13/20   Sater, Nanine Means, MD  etodolac (LODINE) 400 MG tablet Take 1 tablet (400 mg total) by mouth 2 (two) times daily. 09/20/20   Sater, Nanine Means, MD  ferrous sulfate 325 (65 FE) MG tablet Take 325 mg by mouth daily.    [provider]  fluticasone (FLONASE) 50 MCG/ACT nasal spray Place 1-2 sprays into both nostrils daily as needed for allergies or rhinitis.    [provider]  furosemide (LASIX) 20 MG tablet Take 1 tablet (20 mg total) by mouth daily as needed (fluid retention.). Patient taking differently: Take 20 mg by mouth daily as needed for fluid (fluid retention.). 08/30/20   Hilty, Nadean Corwin, MD  gabapentin (NEURONTIN) 300 MG capsule Take 926m (3 capsules) by mouth three times daily Patient taking differently: Take 900 mg by mouth 3 (three) times daily. 09/20/20   Sater, RNanine Means MD  hydrochlorothiazide (HYDRODIURIL) 25 MG tablet Take 1 tablet (25 mg total) by mouth daily. 09/09/20 12/08/20   HPixie Casino MD  lisinopril (ZESTRIL) 40 MG tablet Take 1 tablet (40 mg total) by mouth daily. 10/10/20   VGeradine Girt DO  methocarbamol (ROBAXIN) 500 MG tablet TAKE 1 TABLET(500 MG) BY MOUTH THREE TIMES DAILY Patient taking differently: Take 500 mg by mouth 3 (three) times daily. 09/30/20   Sater, RNanine Means MD  metoprolol tartrate (LOPRESSOR) 25 MG tablet Take 1 tablet (25 mg total) by mouth 2 (  two) times daily. 02/09/20   Almyra Deforest, PA  nitroGLYCERIN (NITROSTAT) 0.4 MG SL tablet Place 1 tablet (0.4 mg total) under the tongue every 5 (five) minutes as needed for chest pain. 04/19/20   Hilty, Nadean Corwin, MD  OXcarbazepine (TRILEPTAL) 300 MG tablet Take one pill qAM and two pills po qHS Patient taking differently: Take 300-600 mg by mouth See admin instructions. Take one pill in the morning and two pills at bedtime 09/20/20   Sater, Nanine Means, MD  oxybutynin (DITROPAN XL) 10 MG 24 hr tablet Take 1 tablet (10 mg total) by mouth at bedtime. 08/23/20   Sater, Nanine Means, MD  traMADol (ULTRAM) 50 MG tablet TAKE 1 TABLET(50 MG) BY MOUTH TWICE DAILY AS NEEDED Patient taking differently: Take 50 mg by mouth every 12 (twelve) hours as needed for moderate pain. 09/20/20   Sater, Nanine Means, MD  venlafaxine XR (EFFEXOR-XR) 75 MG 24 hr capsule TAKE 1 CAPSULE(75 MG) BY MOUTH DAILY WITH BREAKFAST Patient taking differently: Take 75 mg by mouth daily. 08/20/20   Nicholas Lose, MD    Allergies    Pork-derived products  Review of Systems   Review of Systems  Constitutional: Negative for appetite change and fever.  HENT: Negative for congestion.   Respiratory: Negative for shortness of breath.   Cardiovascular: Positive for chest pain.  Gastrointestinal: Negative for abdominal distention.  Genitourinary: Negative for flank pain.  Musculoskeletal: Negative for back pain.  Skin: Negative for rash.  Neurological: Positive for headaches.  Psychiatric/Behavioral: Negative for confusion.    Physical  Exam Updated Vital Signs BP (!) 191/103 (BP Location: Right Arm)   Pulse 88   Temp 98.6 F (37 C) (Oral)   Resp 18   Ht '5\' 4"'  (1.626 m)   Wt 68 kg   LMP 03/06/2020   SpO2 97%   BMI 25.75 kg/m   Physical Exam Vitals and nursing note reviewed.  HENT:     Head: Atraumatic.  Cardiovascular:     Rate and Rhythm: Normal rate and regular rhythm.  Pulmonary:     Breath sounds: No wheezing, rhonchi or rales.  Chest:     Chest wall: No tenderness.  Abdominal:     Palpations: There is no fluid wave.  Musculoskeletal:     Right lower leg: No edema.  Skin:    General: Skin is warm.     Capillary Refill: Capillary refill takes less than 2 seconds.  Neurological:     Mental Status: She is alert and oriented to person, place, and time.     ED Results / Procedures / Treatments   Labs (all labs ordered are listed, but only abnormal results are displayed) Labs Reviewed  BASIC METABOLIC PANEL - Abnormal; Notable for the following components:      Result Value   Sodium 126 (*)    Chloride 91 (*)    Glucose, Bld 108 (*)    All other components within normal limits  CBC - Abnormal; Notable for the following components:   MCHC 36.6 (*)    All other components within normal limits  I-STAT BETA HCG BLOOD, ED ([facility], [facility], [facility] ONLY)  TROPONIN I (HIGH SENSITIVITY)  TROPONIN I (HIGH SENSITIVITY)    EKG None  Radiology DG Chest 2 View  Result Date: 10/14/2020 CLINICAL DATA:  Chest pain, hypertensive for few days EXAM: CHEST - 2 VIEW COMPARISON:  10/08/2020 FINDINGS: Normal heart size, mediastinal contours, and pulmonary vascularity. Coronary stents noted. Lungs clear. No pulmonary infiltrate,  pleural effusion, or pneumothorax. Surgical clips in RIGHT breast and RIGHT axilla. No acute osseous findings. IMPRESSION: No acute abnormalities. Electronically Signed   By: Lavonia Dana M.D.   On: 10/14/2020 09:54    Procedures Procedures (including critical care time)  Medications Ordered in  ED Medications  labetalol (NORMODYNE) injection 20 mg (has no administration in time range)    ED Course  I have reviewed the triage vital signs and the nursing notes.  Pertinent labs & imaging results that were available during my care of the patient were reviewed by me and considered in my medical decision making (see chart for details).    MDM Rules/Calculators/A&P                          Patient with chest pain headache and hypertension.  History of same.  EKG is reassuring.  Troponin negative.  Good renal function.  However blood pressure is 190/100 here.  Will give IV labetalol.  Since recent and frequent history of same we will have patient evaluated by cardiology for potential adjustment of medications and hopefully discharge home if no improvement potentially could require admission to the hospital again.  Care turned over to Dr. Ashok Cordia Final Clinical Impression(s) / ED Diagnoses Final diagnoses:  Hypertension, unspecified type  Chest pain, unspecified type  Nonintractable headache, unspecified chronicity pattern, unspecified headache type    Rx / DC Orders ED Discharge Orders    None       Davonna Belling, MD 10/14/20 1501

## 2020-10-14 NOTE — Consult Note (Signed)
Cardiology Consultation:   Patient ID: An Lannan MRN: [id number]; DOB: 1970/12/05  Admit date: 10/14/2020 Date of Consult: 10/14/2020  Primary Care Provider: Patient, No Pcp Per [facility] Cardiologist: Pixie Casino, MD  [facility] Electrophysiologist:  None    Patient Profile:   Anna Henson is a 50 y.o. female with a hx of CAD status post PCI, severe hypertension, diastolic heart failure who is being seen today for the evaluation of severe symptomatic hypertension at the request of Dr. Alvino Chapel and Dr. Ashok Cordia.  History of Present Illness:   Ms. Klimas has a history of premature onset CAD with revascularization procedures in 2015 (DES mid RCA for unstable angina) and 2018 (DES proximal RCA in setting of non-STEMI), severe hypertension, CKD stage II, hypertrophic cardiomyopathy with dynamic obstruction (by echo 2018), history of right breast cancer status post lumpectomy and external beam radiation therapy completed 2018, history of cervical spine stenosis and transverse myelitis (2017), history of bipolar disorder and anxiety.  She does not have diabetes mellitus.  She was briefly hospitalized on New Year's Eve for hypertension hypokalemia, with increase in blood pressure suspected to be due to increased dietary sodium intake.  Labs were significant for severe hypokalemia with a potassium of 2.9 and moderate anion gap metabolic acidosis (bicarb 19, anion gap 17).  Cardiac troponin was normal.  On arrival her blood pressure was 205/87, at the time of discharge her blood pressure was down to 133/85.  There was no clinical evidence of heart failure and her chest x-ray did not show hypervolemia.  Note that LDL was elevated at 161.  She returns 5 days following discharge with complaints of severe migraine headache and mild chest discomfort in the setting of severe hypertension.  She reports compliance with all her antihypertensive medications.  Her potassium level is now normal and  there is no evidence of acidosis, but she has mild/moderate hyponatremia with a sodium of 126.  She reports compliance with her hydrochlorothiazide, but denies taking furosemide recently.  She has not had any problems with edema, dyspnea with exertion, orthopnea or PND.  She denies palpitations and syncope.  Her creatinine is substantially better on this visit than during her last admission and is actually normal at 0.97.  She has a very strong family history of severe hypertension and she herself has had high blood pressure since approximately the age of 77.  She does not think that she's ever been evaluated for renal artery stenosis either here or in [place] (she is originally from [place]).  She is taking maximum doses of amlodipine and lisinopril as well as moderate doses of hydrochlorothiazide and metoprolol.  Previous echocardiograms performed in 2015 and 2018 describes a dynamic LV outflow tract of about 30-40 mmHg, but this was not reported on subsequent electrocardiograms performed in 2018 and 2019.  On my review of the echocardiogram from March 2018 there is clear evidence of systolic anterior motion of the anterior mitral leaflet with left ventricle outflow tract obstruction and possibly late systolic notching of the aortic valve M-mode, but the "gradient" is mid cavitary, not in the left ventricular outflow tract.  The pattern of left ventricular hypertrophy is symmetrical.  On the echocardiogram from late 2018 in 2019, there is still moderate LVH but there is no evidence of systolic anterior motion of the mitral valve, suggesting that hypertrophic obstructive cardiomyopathy was situational/artifactual, maybe related to hypovolemia.   Past Medical History:  Diagnosis Date  . Anemia   . Anxiety   .  Bipolar disorder in full remission ([place])   . CAD in native artery cardiologist--  dr hilty   a. 09/2014 Cath/PCI in setting of [place] - s/p 2.25 x 12 mm Promus Premier DES to mid RCA;  b. 11/2016  Myoview: EF 56%, no ischemia;  c. 12/2016 NSTEMI/PCI: LM nl, LAd 25p, LCX 30p, RCA 100p (2.75x38 Promus Premier DES), mRCA 10 ISR, EF 50-55%.  . CKD (chronic kidney disease), stage III ([place])   . Depression   . Genetic testing 04/23/2017   Ms. Lafosse underwent genetic counseling and testing for hereditary cancer syndromes on 04/05/2017. Her results were negative for mutations in all 46 genes analyzed by [facility]'s 46-gene Common Hereditary Cancers Panel. Genes analyzed include: APC, ATM, AXIN2, BARD1, BMPR1A, BRCA1, BRCA2, BRIP1, CDH1, CDKN2A, CHEK2, CTNNA1, DICER1, EPCAM, GREM1, HOXB13, KIT, MEN1, MLH1, MSH2, MSH3, MSH6, MUTYH, NBN,  . GERD (gastroesophageal reflux disease)   . Headache   . History of external beam radiation therapy    right breast 07-27-2017  to 09-25-2017  . History of non-ST elevation myocardial infarction (NSTEMI)   . HOCM (hypertrophic obstructive cardiomyopathy) ([place]) followed by cardiology   a. 12/2016 Echo: EF 65-70%,  mod conc LVH, dynamic obstruction @ rest, peak velocity of 291 cm/sec w/ peak gradient of 58mHg, no rwma, Gr1 DD, triv TR, PASP 141mg.  . Marland Kitchenyperlipidemia   . Hypertension   . Malignant neoplasm of lower-outer quadrant of right breast of female, estrogen receptor positive [facility] dr gu[facility]indi Adie dx 06/ 2018--- right breast invasive lobular carcinoma, ductal carcinoma w/ LCIS---- 06-21-2017 s/p right breast lumpecotmy w/ sln disseciton;   completed radiation 09-25-2017;  on tamoxifen  . Myocardial infarction ([facility]   2017  . S/P drug eluting coronary stent placement    09-14-2014---  [facility]  PCI with DES x1 to proxRCA  . Trans[facility] dr saFelecia Shelling 01/ 2017  dx transverse myelitis w/ right side numbness (09-01-2019  currently lower extremity weakness, mucsle spasms, and gait disturbance)  . Wears glasses     Past Surgical History:  Procedure Laterality Date  . BREAST LUMPECTOMY WITH  RADIOACTIVE SEED AND SENTINEL LYMPH NODE BIOPSY Right 06/21/2017   Procedure: RIGHT BREAST BRACKETED SEED GUIDED LUMPECTOMY AND SENTINEL LYMPH NODE BIOPSY;  Surgeon: ToJovita KussmaulMD;  [place]: General;  Laterality: Right;  . CORONARY/GRAFT ACUTE MI REVASCULARIZATION N/A 12/31/2016   Procedure: Coronary/Graft Acute MI Revascularization;  Surgeon: MiSherren MochaMD;  [place] [facility];  Service: Cardiovascular;  Laterality: N/A;  . DILATATION & CURETTAGE/HYSTEROSCOPY WITH MYOSURE N/A 09/02/2019   Procedure: DILATATION & CURETTAGE/HYSTEROSCOPY WITH MYOSURE;  Surgeon: FoAloha GellMD;  [place] Service: Gynecology;  Laterality: N/A;  . HERNIA REPAIR    . LEFT HEART CATH AND CORONARY ANGIOGRAPHY N/A 12/31/2016   Procedure: Left Heart Cath and Coronary Angiography;  Surgeon: MiSherren MochaMD;  [place] [facility];  Service: Cardiovascular;  Laterality: N/A;  . LEFT HEART CATHETERIZATION WITH CORONARY ANGIOGRAM N/A 09/14/2014   Procedure: LEFT HEART CATHETERIZATION WITH CORONARY ANGIOGRAM;  Surgeon: ChBurnell BlanksMD;  [facility];  Service: Cardiovascular;  Laterality: N/A;  . MASS EXCISION N/A 03/30/2020   Procedure: EXCISION MASS RIGHT LABIA MINORA;  Surgeon: TuLafonda MossesMD;  Location: [facility] ORS;  Service: Gynecology;  Laterality: N/A;  . PERCUTANEOUS CORONARY STENT INTERVENTION (PCI-S)  09/14/2014   Procedure: PERCUTANEOUS  CORONARY STENT INTERVENTION (PCI-S);  Surgeon: Burnell Blanks, MD;  Location[facility] LAB;  Service: Cardiovascular;;  . ROBOTIC ASSISTED SALPINGO OOPHERECTOMY Bilateral 03/30/2020   Procedure: XI ROBOTIC ASSISTED SALPINGO OOPHORECTOMY;  Surgeon: Lafonda Mosses, MD;  Location: [facility] ORS;  Service: Gynecology;  Laterality: Bilateral;  . TONSILLECTOMY  2005  . UMBILICAL HERNIA REPAIR  2001; 2005     Home Medications:  Prior to Admission medications   Medication Sig Start Date  End Date Taking? Authorizing Provider  acetaminophen (TYLENOL) 325 MG tablet Take 325-650 mg by mouth every 8 (eight) hours as needed for moderate pain.   Yes [provider]  amLODipine (NORVASC) 10 MG tablet TAKE 1 TABLET(10 MG) BY MOUTH DAILY Patient taking differently: Take 10 mg by mouth daily. 09/23/20  Yes Hilty, Nadean Corwin, MD  anastrozole (ARIMIDEX) 1 MG tablet Take 1 tablet (1 mg total) by mouth daily. 09/01/20  Yes Nicholas Lose, MD  aspirin 81 MG chewable tablet Chew 1 tablet (81 mg total) by mouth daily. 09/16/14  Yes Nita Sells, MD  atorvastatin (LIPITOR) 80 MG tablet TAKE 1 TABLET(80 MG) BY MOUTH DAILY Patient taking differently: Take 80 mg by mouth daily. 07/01/20  Yes Hilty, Nadean Corwin, MD  baclofen (LIORESAL) 10 MG tablet TAKE BY MOUTH UP TO 4 TABLETS EVERY DAY FOR MUSCLE SPASMS Patient taking differently: Take 10 mg by mouth 4 (four) times daily as needed for muscle spasms. 09/13/20  Yes Sater, Nanine Means, MD  etodolac (LODINE) 400 MG tablet Take 1 tablet (400 mg total) by mouth 2 (two) times daily. 09/20/20  Yes Sater, Nanine Means, MD  ferrous sulfate 325 (65 FE) MG tablet Take 325 mg by mouth daily.   Yes [provider]  metoprolol tartrate (LOPRESSOR) 25 MG tablet Take 1 tablet (25 mg total) by mouth 2 (two) times daily. 02/09/20  Yes Almyra Deforest, PA  fluticasone (FLONASE) 50 MCG/ACT nasal spray Place 1-2 sprays into both nostrils daily as needed for allergies or rhinitis.    [provider]  furosemide (LASIX) 20 MG tablet Take 1 tablet (20 mg total) by mouth daily as needed (fluid retention.). Patient taking differently: Take 20 mg by mouth daily as needed for fluid (fluid retention.). 08/30/20   Hilty, Nadean Corwin, MD  gabapentin (NEURONTIN) 300 MG capsule Take 958m (3 capsules) by mouth three times daily Patient taking differently: Take 900 mg by mouth 3 (three) times daily. 09/20/20   Sater, RNanine Means MD  hydrochlorothiazide (HYDRODIURIL) 25 MG  tablet Take 1 tablet (25 mg total) by mouth daily. 09/09/20 12/08/20  HPixie Casino MD  lisinopril (ZESTRIL) 40 MG tablet Take 1 tablet (40 mg total) by mouth daily. 10/10/20   VGeradine Girt DO  methocarbamol (ROBAXIN) 500 MG tablet TAKE 1 TABLET(500 MG) BY MOUTH THREE TIMES DAILY Patient taking differently: Take 500 mg by mouth 3 (three) times daily. 09/30/20   Sater, RNanine Means MD  nitroGLYCERIN (NITROSTAT) 0.4 MG SL tablet Place 1 tablet (0.4 mg total) under the tongue every 5 (five) minutes as needed for chest pain. 04/19/20   Hilty, KNadean Corwin MD  OXcarbazepine (TRILEPTAL) 300 MG tablet Take one pill qAM and two pills po qHS Patient taking differently: Take 300-600 mg by mouth See admin instructions. Take one pill in the morning and two pills at bedtime 09/20/20   Sater, RNanine Means MD  oxybutynin (DITROPAN XL) 10 MG 24 hr tablet Take 1 tablet (10 mg total) by mouth at bedtime. 08/23/20  Sater, Nanine Means, MD  traMADol (ULTRAM) 50 MG tablet TAKE 1 TABLET(50 MG) BY MOUTH TWICE DAILY AS NEEDED Patient taking differently: Take 50 mg by mouth every 12 (twelve) hours as needed for moderate pain. 09/20/20   Sater, Nanine Means, MD  venlafaxine XR (EFFEXOR-XR) 75 MG 24 hr capsule TAKE 1 CAPSULE(75 MG) BY MOUTH DAILY WITH BREAKFAST Patient taking differently: Take 75 mg by mouth daily. 08/20/20   Nicholas Lose, MD    Inpatient Medications: Scheduled Meds: . labetalol  20 mg Intravenous Once   Continuous Infusions: . sodium chloride     PRN Meds:   Allergies:    Allergies  Allergen Reactions  . Pork-Derived Products Other (See Comments)    Does NOT eat pork    Social History:   Social History   Socioeconomic History  . Marital status: Single    Spouse name: Not on file  . Number of children: 0  . Years of education: masters  . Highest education level: Not on file  Occupational History  . Occupation: Therapist, art at a call center    Employer: [employer]  Tobacco Use  . Smoking  status: Former Smoker    Packs/day: 0.50    Years: 30.00    Pack years: 15.00    Types: Cigarettes    Quit date: 10/05/2015    Years since quitting: 5.0  . Smokeless tobacco: Never Used  Vaping Use  . Vaping Use: Former  Substance and Sexual Activity  . Alcohol use: Yes    Alcohol/week: 2.0 standard drinks    Types: 2 Cans of beer per week    Comment: occas.  . Drug use: No  . Sexual activity: Not Currently  Other Topics Concern  . Not on file  Social History Narrative   Consumes 2 cups of caffeine daily   Social Determinants of Health   Financial Resource Strain: Not on file  Food Insecurity: Not on file  Transportation Needs: Not on file  Physical Activity: Not on file  Stress: Not on file  Social Connections: Not on file  Intimate Partner Violence: Not on file    Family History:    Family History  Problem Relation Age of Onset  . Diabetes Mother   . Heart disease Mother   . Hyperlipidemia Mother   . Hypertension Mother   . Breast cancer Mother   . Lung cancer Father   . Drug abuse Father   . Brain cancer Father 68  . Bone cancer Father 81  . Drug abuse Sister   . Mental illness Brother   . Mental illness Maternal Grandmother   . Stomach cancer Paternal Grandmother 58       d.75     ROS:  Please see the history of present illness.   All other ROS reviewed and negative.     Physical Exam/Data:   Vitals:   10/14/20 1254 10/14/20 1427 10/14/20 1533 10/14/20 1721  BP: (!) 150/83 (!) 191/103 (!) 167/64 (!) 148/93  Pulse: 89 88 96 81  Resp: _0 Temp: 98.6 F (37 C)     TempSrc: Oral     SpO2: 100% 97% 100% 100%  Weight:      Height:       No intake or output data in the 24 hours ending 10/14/20 1726 Last 3 Weights 10/14/2020 10/09/2020 09/20/2020  Weight (lbs) 150 lb 146 lb 6.2 oz 149 lb  Weight (kg) 68.04 kg 66.4 kg 67.586 kg  Some  encounter information is confidential and restricted. Go to Review Flowsheets activity to see all data.      Body mass index is 25.75 kg/m.  General:  Well nourished, well developed, in no acute distress.  She is lying fully supine on the stretcher without any respiratory difficulty HEENT: normal Lymph: no adenopathy Neck: no JVD Endocrine:  No thryomegaly Vascular: No carotid bruits; FA pulses 2+ bilaterally without bruits  Cardiac:  normal S1, S2; RRR; no murmur is heard either at rest or with a Valsalva maneuver.  She has excellent radial, ulnar and pedal pulses and there are no audible vascular bruits Lungs:  clear to auscultation bilaterally, no wheezing, rhonchi or rales  Abd: soft, nontender, no hepatomegaly  Ext: no edema Musculoskeletal:  No deformities, BUE and BLE strength normal and equal Skin: warm and dry  Neuro:  CNs 2-12 intact, no focal abnormalities noted Psych:  Normal affect   EKG:  The EKG was personally reviewed and demonstrates: Normal sinus rhythm, no evidence of LVH or repolarization abnormalities. Telemetry:  Telemetry was personally reviewed and demonstrates:  n/a  Relevant CV Studies: Echocardiograms from 2018 and 2019 personally reviewed.  See comments above.  Laboratory Data:  High Sensitivity Troponin:   Recent Labs  Lab 10/08/20 1641 10/08/20 2120 10/09/20 0350 10/14/20 0934 10/14/20 1130  TROPONINIHS 20* 25* 24* 12 13     Chemistry Recent Labs  Lab 10/08/20 1304 10/09/20 0350 10/14/20 0934  NA 134* 137 126*  K 2.9* 3.5 3.6  CL 98 101 91*  CO2 19* 24 25  GLUCOSE 101* 92 108*  BUN _0 CREATININE 0.95 1.13* 0.97  CALCIUM 10.3 9.8 9.2  GFRNONAA >60 >60 >60  ANIONGAP 17* 12 10    No results for input(s): PROT, ALBUMIN, AST, ALT, ALKPHOS, BILITOT in the last 168 hours. Hematology Recent Labs  Lab 10/08/20 1304 10/14/20 0934  WBC 11.8* 8.5  RBC 4.91 4.15  HGB 16.0* 13.7  HCT 44.1 37.4  MCV 89.8 90.1  MCH 32.6 33.0  MCHC 36.3* 36.6*  RDW 14.2 13.2  PLT 409* 317   BNPNo results for input(s): BNP, PROBNP in the last 168 hours.   DDimer No results for input(s): DDIMER in the last 168 hours.   Radiology/Studies:  DG Chest 2 View  Result Date: 10/14/2020 CLINICAL DATA:  Chest pain, hypertensive for few days EXAM: CHEST - 2 VIEW COMPARISON:  10/08/2020 FINDINGS: Normal heart size, mediastinal contours, and pulmonary vascularity. Coronary stents noted. Lungs clear. No pulmonary infiltrate, pleural effusion, or pneumothorax. Surgical clips in RIGHT breast and RIGHT axilla. No acute osseous findings. IMPRESSION: No acute abnormalities. Electronically Signed   By: Lavonia Dana M.D.   On: 10/14/2020 09:54     Assessment and Plan:   1. HTN: Considering the severity of her hypertension and her vascular history, I would recommend a renal duplex arterial ultrasound for possible renal artery stenosis.  Although there is clearly a component of familial hypertension, the refractory nature of her symptoms should lead to an evaluation for secondary hypertension.  We will also check a renin aldosterone ratio. She might benefit from treatment with spironolactone.  However, beta-blockers should be preferentially used since she has a hyperdynamic/hypertrophic left ventricle and tendency to develop obstructive physiology when she is hypovolemic. Continue amlodipine and lisinopril. 2. Hyponatremia: It is rather surprising that this is developed so quickly compared to the normal sodium level just a few days ago.  I suspect that it is due to  relative hypovolemia.  Recheck labs.  Hold off spironolactone for now.  Avoid concomitant use of thiazide and loop diuretics. 3. CHF: No evidence of hypervolemia or heart failure at this time.  In fact she appears to be rather "dry". 4. CAD: Currently asymptomatic.  History of premature onset CAD (this precedes chest radiation therapy).  LDL should be less than 70.  Recently it was 161, considerably higher than values in 2018 and 2019, suggesting noncompliance with her medications. 5. HLP: reinforce compliance  with lipid lowering therapy.      For questions or updates, please contact [place] Please consult [email or web address] for contact info under    Signed, Sanda Klein, MD  10/14/2020 5:26 PM

## 2020-10-14 NOTE — ED Triage Notes (Signed)
Pt with chest pain that began last night. Her bp readings at home were 180 systolic. Recently discharged for CHF. Hx of MI with stents. A/OX4

## 2020-10-14 NOTE — ED Provider Notes (Signed)
Patient signed out by Dr Rubin Payor at 1600 that cardiology was consulting on patient and coming to see.  Cardiology rec admission to medicine, and ordered some additional workup including renal artery duplex u/s and additional labs.   As patient with new hyponatremia, on lasix/hctz at home, and appears somewhat dry on exam, ns boluses in ED.   Hospitalists consulted for admission re chest pain, hyponatremia, uncontrolled htn.    Cathren Laine, MD 10/14/20 1901

## 2020-10-14 NOTE — H&P (Addendum)
Anna Henson TXH:[id number] DOB: July 26, 1971 DOA: 10/14/2020     PCP: Patient, No Pcp Per       Patient arrived to ER on 10/14/20 at [place] Referred by Attending Lajean Saver, MD   Patient coming from: home Lives  With SO    Chief Complaint:   Chief Complaint  Patient presents with  . Chest Pain    HPI: Anna Henson is a 50 y.o. female with medical history significant of breast cancer, chronic diastolic CHF, transverse myelitis, cervical spinal stenosis  neuropathy, chronic pain, coronary artery disease with stent in 2018, hypertension, and depression,, GERD, HCOM    Presented with elevated blood pressure at home BP up to 171/109 She started to have chest pain again and presented to emergency department. Reports pain in her chest is typical her when her blood pressure goes up she reports dull chest pain which is recurrent. Patient also endorsing headache which is typical for her hypertensive urgency/emergency. No leg swelling no palpitations no syncope no shortness of breath orthopnea She does have a CAD with stent placement 2018. No associated diaphoresis or nausea. Recently was admitted for hypertensive crisis and chest pain felt to be secondary to dietary noncompliance her lisinopril was increased and she was discharged on 1 January to home  Infectious risk factors:  Reports  chest pain      Has   been vaccinated against COVID x1 was told by her neurologist not to get vaccinated due to transverse myelitis   Initial COVID TEST   in house  PCR testing  Pending  Lab Results  Component Value Date   [place] 10/08/2020   [place] 03/26/2020   [place] NEGATIVE 11/15/2019   [place] NEGATIVE 09/02/2019     Regarding pertinent Chronic problems:     Hyperlipidemia -  on statins Lipitor Lipid Panel     Component Value Date/Time   CHOL 226 (H) 10/09/2020 0350   CHOL 149 07/09/2018 1509   TRIG 181 (H) 10/09/2020 0350   HDL 29 (L)  10/09/2020 0350   HDL 42 07/09/2018 1509   CHOLHDL 7.8 10/09/2020 0350   VLDL 36 10/09/2020 0350   LDLCALC 161 (H) 10/09/2020 0350   LDLCALC 87 07/09/2018 1509   LABVLDL 20 07/09/2018 1509     HTN on Norvasc lisinopril ,  hydrochlorothiazide metoprolol  Breast cancer on Arimidex   chronic CHF diastolic - last echo 2023 On Lasix    CAD  - On Aspirin, statin, betablocker                 - *followed by cardiology                - last cardiac cath 2018     While in ER: Potassium normal Found to have mild hyponatremia sodium 126 She has been taking hydrochlorothiazide but have not been taking her Lasix  ER Provider Called: Cardiology   Dr. Sallyanne Kuster They Recommend admit to medicine  Admit evaluate for secondary hypertension with renal duplex arterial ultrasound renin aldosterone ratio. Will seen in ER Renal ultrasound was attempted but will try again in the morning She felt to be dry in the emergency department and her Lasix and hydrochlorothiazide was held and she was given some IV fluids.  Hospitalist was called for admission for hypertensive urgency  The following Work up has been ordered so far:  Orders Placed This Encounter  Procedures  . DG Chest 2 View  . US RENAL ARTERY DUPLEX COMPLETE  .  Basic metabolic panel  . CBC  . Aldosterone + renin activity w/ ratio  . Basic metabolic panel  . Diet NPO time specified  . Document Height and Actual Weight  . Re-check Vital Signs  . Offer Fluids  . Offer food (encourage patient to eat)  . Inpatient consult to Cardiology  ALL PATIENTS BEING ADMITTED/HAVING PROCEDURES NEED COVID-19 SCREENING  . Consult to hospitalist  ALL PATIENTS BEING ADMITTED/HAVING PROCEDURES NEED COVID-19 SCREENING  . I-Stat beta hCG blood, ED  . ED EKG     Following Medications were ordered in ER: Medications  labetalol (NORMODYNE) injection 20 mg (0 mg Intravenous Hold 10/14/20 1535)  metoprolol tartrate (LOPRESSOR) tablet 50 mg (has no  administration in time range)  sodium chloride 0.9 % bolus 500 mL (has no administration in time range)  ketorolac (TORADOL) 30 MG/ML injection 15 mg (15 mg Intravenous Given 10/14/20 1556)  sodium chloride 0.9 % bolus 500 mL (0 mLs Intravenous Stopped 10/14/20 1914)        Consult Orders  (From admission, onward)         Start     Ordered   10/14/20 1902  Consult to hospitalist  ALL PATIENTS BEING ADMITTED/HAVING PROCEDURES NEED COVID-19 SCREENING Paged by Jerene Pitch  Once       Comments: ALL PATIENTS BEING ADMITTED/HAVING PROCEDURES NEED COVID-19 SCREENING  Provider:  (Not yet assigned)  Question Answer Comment  Place call to: [facility]   Reason for Consult Admit      10/14/20 1901          Significant initial  Findings: Abnormal Labs Reviewed  BASIC METABOLIC PANEL - Abnormal; Notable for the following components:      Result Value   Sodium 126 (*)    Chloride 91 (*)    Glucose, Bld 108 (*)    All other components within normal limits  CBC - Abnormal; Notable for the following components:   MCHC 36.6 (*)    All other components within normal limits  COMPREHENSIVE METABOLIC PANEL - Abnormal; Notable for the following components:   Potassium 3.0 (*)    BUN 5 (*)    Calcium 7.8 (*)    Total Protein 5.2 (*)    Albumin 3.0 (*)    All other components within normal limits  PHOSPHORUS - Abnormal; Notable for the following components:   Phosphorus 2.0 (*)    All other components within normal limits    Otherwise labs showing:    Recent Labs  Lab 10/08/20 1304 10/09/20 0350 10/14/20 0934 10/14/20 1954  NA 134* 137 126* 135  K 2.9* 3.5 3.6 3.0*  CO2 19* _0 GLUCOSE 101* 92 108* 77  BUN _1 5*  CREATININE 0.95 1.13* 0.97 0.85  CALCIUM 10.3 9.8 9.2 7.8*  MG  --  2.3  --  1.8  PHOS  --   --   --  2.0*    Cr   stable, Lab Results  Component Value Date   CREATININE 0.85 10/14/2020   CREATININE 0.97 10/14/2020   CREATININE 1.13 (H) 10/09/2020     Recent Labs  Lab 10/14/20 1954  AST 24  ALT 24  ALKPHOS 72  BILITOT 0.3  PROT 5.2*  ALBUMIN 3.0*   Lab Results  Component Value Date   CALCIUM 9.2 10/14/2020     WBC     Component Value Date/Time   WBC 8.5 10/14/2020 0934   LYMPHSABS 2.1 04/01/2018 1015   LYMPHSABS  2.1 04/04/2017 0835   MONOABS 0.8 04/01/2018 1015   MONOABS 0.6 04/04/2017 0835   EOSABS 0.3 04/01/2018 1015   EOSABS 0.2 04/04/2017 0835   BASOSABS 0.1 04/01/2018 1015   BASOSABS 0.1 04/04/2017 0835  Plt: Lab Results  Component Value Date   PLT 317 10/14/2020       HG/HCT   stable,       Component Value Date/Time   HGB 13.7 10/14/2020 0934   HGB 11.1 (L) 04/01/2018 1015   HGB 11.6 04/04/2017 0835   HCT 37.4 10/14/2020 0934   HCT 33.5 (L) 04/04/2017 0835   MCV 90.1 10/14/2020 0934   MCV 90.8 04/04/2017 0835    Recent Labs  Lab 10/08/20 2120  LIPASE 24   No results for input(s): AMMONIA in the last 168 hours.  Troponin trop 12 13 Cardiac Panel (last 3 results) Recent Labs    10/14/20 1954  CKTOTAL 102       ECG: Ordered Personally reviewed by me showing: HR : 69 Rhythm: NSR   no evidence of ischemic changes QTC 441   BNP (last 3 results) Recent Labs    10/14/20 2030  BNP 30.8       UA  ordered   Ordered   CXR - NON acute    ED Triage Vitals  Enc Vitals Group     BP 10/14/20 0928 (!) 168/92     Pulse Rate 10/14/20 0928 69     Resp 10/14/20 0928 18     Temp 10/14/20 0928 98.4 F (36.9 C)     Temp Source 10/14/20 1107 Oral     SpO2 10/14/20 0928 100 %     Weight 10/14/20 0929 150 lb (68 kg)     Height 10/14/20 0929 _0  (1.626 m)     Head Circumference --      Peak Flow --      Pain Score 10/14/20 0929 6     Pain Loc --      Pain Edu? --      Excl. in [place]? --   TMAX(24)@       Latest  Blood pressure 140/75, pulse 88, temperature 98.3 F (36.8 C), temperature source Oral, resp. rate 18, height _1  (1.626 m), weight 68 kg, last menstrual period  03/06/2020, SpO2 100 %.    Review of Systems:    Pertinent positives include:   fatigue, chest pain,   headaches Constitutional:  No weight loss, night sweats, Fevers, chills,weight loss  HEENT:  No, Difficulty swallowing,Tooth/dental problems,Sore throat,  No sneezing, itching, ear ache, nasal congestion, post nasal drip,  Cardio-vascular:  No Orthopnea, PND, anasarca, dizziness, palpitations.no Bilateral lower extremity swelling  GI:  No heartburn, indigestion, abdominal pain, nausea, vomiting, diarrhea, change in bowel habits, loss of appetite, melena, blood in stool, hematemesis Resp:  no shortness of breath at rest. No dyspnea on exertion, No excess mucus, no productive cough, No non-productive cough, No coughing up of blood.No change in color of mucus.No wheezing. Skin:  no rash or lesions. No jaundice GU:  no dysuria, change in color of urine, no urgency or frequency. No straining to urinate.  No flank pain.  Musculoskeletal:  No joint pain or no joint swelling. No decreased range of motion. No back pain.  Psych:  No change in mood or affect. No depression or anxiety. No memory loss.  Neuro: no localizing neurological complaints, no tingling, no weakness, no double vision, no gait abnormality, no slurred speech, no confusion  All systems reviewed and apart from [place] all are negative  Past Medical History:   Past Medical History:  Diagnosis Date  . Anemia   . Anxiety   . Bipolar disorder in full remission ([place])   . CAD in native artery cardiologist--  dr hilty   a. 09/2014 Cath/PCI in setting of [place] - s/p 2.25 x 12 mm Promus Premier DES to mid RCA;  b. 11/2016 Myoview: EF 56%, no ischemia;  c. 12/2016 NSTEMI/PCI: LM nl, LAd 25p, LCX 30p, RCA 100p (2.75x38 Promus Premier DES), mRCA 10 ISR, EF 50-55%.  . CKD (chronic kidney disease), stage III ([place])   . Depression   . Genetic testing 04/23/2017   Ms. Tomaselli underwent genetic counseling and testing for hereditary cancer  syndromes on 04/05/2017. Her results were negative for mutations in all 46 genes analyzed by [facility]'s 46-gene Common Hereditary Cancers Panel. Genes analyzed include: APC, ATM, AXIN2, BARD1, BMPR1A, BRCA1, BRCA2, BRIP1, CDH1, CDKN2A, CHEK2, CTNNA1, DICER1, EPCAM, GREM1, HOXB13, KIT, MEN1, MLH1, MSH2, MSH3, MSH6, MUTYH, NBN,  . GERD (gastroesophageal reflux disease)   . Headache   . History of external beam radiation therapy    right breast 07-27-2017  to 09-25-2017  . History of non-ST elevation myocardial infarction (NSTEMI)   . HOCM (hypertrophic obstructive cardiomyopathy) ([place]) followed by cardiology   a. 12/2016 Echo: EF 65-70%,  mod conc LVH, dynamic obstruction @ rest, peak velocity of 291 cm/sec w/ peak gradient of 27mHg, no rwma, Gr1 DD, triv TR, PASP 152mg.  . Marland Kitchenyperlipidemia   . Hypertension   . Malignant neoplasm of lower-outer quadrant of right breast of female, estrogen receptor positive [facility] dr gu[facility]indi Adie dx 06/ 2018--- right breast invasive lobular carcinoma, ductal carcinoma w/ LCIS---- 06-21-2017 s/p right breast lumpecotmy w/ sln disseciton;   completed radiation 09-25-2017;  on tamoxifen  . Myocardial infarction ([place]   2017  . S/P drug eluting coronary stent placement    09-14-2014---  [facility]  PCI with DES x1 to proxRCA  . Trans[facility] dr saFelecia Shelling 01/ 2017  dx transverse myelitis w/ right side numbness (09-01-2019  currently lower extremity weakness, mucsle spasms, and gait disturbance)  . Wears glasses      Past Surgical History:  Procedure Laterality Date  . BREAST LUMPECTOMY WITH RADIOACTIVE SEED AND SENTINEL LYMPH NODE BIOPSY Right 06/21/2017   Procedure: RIGHT BREAST BRACKETED SEED GUIDED LUMPECTOMY AND SENTINEL LYMPH NODE BIOPSY;  Surgeon: ToJovita KussmaulMD;  Location: [facility] Service: General;  Laterality: Right;  . CORONARY/GRAFT ACUTE MI REVASCULARIZATION N/A 12/31/2016   Procedure:  Coronary/Graft Acute MI Revascularization;  Surgeon: MiSherren MochaMD;  Location: [facility];  Service: Cardiovascular;  Laterality: N/A;  . DILATATION & CURETTAGE/HYSTEROSCOPY WITH MYOSURE N/A 09/02/2019   Procedure: DILATATION & CURETTAGE/HYSTEROSCOPY WITH MYOSURE;  Surgeon: FoAloha GellMD;  Location: [facility] Service: Gynecology;  Laterality: N/A;  . HERNIA REPAIR    . LEFT HEART CATH AND CORONARY ANGIOGRAPHY N/A 12/31/2016   Procedure: Left Heart Cath and Coronary Angiography;  Surgeon: MiSherren MochaMD;  Location: [facility];  Service: Cardiovascular;  Laterality: N/A;  . LEFT HEART CATHETERIZATION WITH CORONARY ANGIOGRAM N/A 09/14/2014   Procedure: LEFT HEART CATHETERIZATION WITH CORONARY ANGIOGRAM;  Surgeon: ChBurnell BlanksMD;  [facility];  Service: Cardiovascular;  Laterality: N/A;  . MASS EXCISION N/A 03/30/2020   Procedure: EXCISION  MASS RIGHT LABIA MINORA;  Surgeon: Lafonda Mosses, MD;  Location: [facility] ORS;  Service: Gynecology;  Laterality: N/A;  . PERCUTANEOUS CORONARY STENT INTERVENTION (PCI-S)  09/14/2014   Procedure: PERCUTANEOUS CORONARY STENT INTERVENTION (PCI-S);  Surgeon: Burnell Blanks, MD;  [facility];  Service: Cardiovascular;;  . ROBOTIC ASSISTED SALPINGO OOPHERECTOMY Bilateral 03/30/2020   Procedure: XI ROBOTIC ASSISTED SALPINGO OOPHORECTOMY;  Surgeon: Lafonda Mosses, MD;  Location: [facility] ORS;  Service: Gynecology;  Laterality: Bilateral;  . TONSILLECTOMY  2005  . UMBILICAL HERNIA REPAIR  2001; 2005    Social History:  Ambulatory   independently       reports that she quit smoking about 5 years ago. Her smoking use included cigarettes. She has a 15.00 pack-year smoking history. She has never used smokeless tobacco. She reports current alcohol use of about 2.0 standard drinks of alcohol per week. She reports that she does not use drugs.   Family History: Family History  Problem  Relation Age of Onset  . Diabetes Mother   . Heart disease Mother   . Hyperlipidemia Mother   . Hypertension Mother   . Breast cancer Mother   . Lung cancer Father   . Drug abuse Father   . Brain cancer Father 42  . Bone cancer Father 47  . Drug abuse Sister   . Mental illness Brother   . Mental illness Maternal Grandmother   . Stomach cancer Paternal Grandmother 40       d.75    Allergies: Allergies  Allergen Reactions  . Pork-Derived Products Other (See Comments)    Does NOT eat pork     Prior to Admission medications   Medication Sig Start Date End Date Taking? Authorizing Provider  acetaminophen (TYLENOL) 325 MG tablet Take 325-650 mg by mouth every 8 (eight) hours as needed for moderate pain.   Yes [provider]  amLODipine (NORVASC) 10 MG tablet TAKE 1 TABLET(10 MG) BY MOUTH DAILY Patient taking differently: Take 10 mg by mouth daily. 09/23/20  Yes Hilty, Nadean Corwin, MD  anastrozole (ARIMIDEX) 1 MG tablet Take 1 tablet (1 mg total) by mouth daily. 09/01/20  Yes Nicholas Lose, MD  aspirin 81 MG chewable tablet Chew 1 tablet (81 mg total) by mouth daily. 09/16/14  Yes Nita Sells, MD  atorvastatin (LIPITOR) 80 MG tablet TAKE 1 TABLET(80 MG) BY MOUTH DAILY Patient taking differently: Take 80 mg by mouth daily. 07/01/20  Yes Hilty, Nadean Corwin, MD  baclofen (LIORESAL) 10 MG tablet TAKE BY MOUTH UP TO 4 TABLETS EVERY DAY FOR MUSCLE SPASMS Patient taking differently: Take 10 mg by mouth 4 (four) times daily as needed for muscle spasms. 09/13/20  Yes Sater, Nanine Means, MD  etodolac (LODINE) 400 MG tablet Take 1 tablet (400 mg total) by mouth 2 (two) times daily. 09/20/20  Yes Sater, Nanine Means, MD  ferrous sulfate 325 (65 FE) MG tablet Take 325 mg by mouth daily.   Yes [provider]  metoprolol tartrate (LOPRESSOR) 25 MG tablet Take 1 tablet (25 mg total) by mouth 2 (two) times daily. 02/09/20  Yes Almyra Deforest, PA  fluticasone (FLONASE) 50 MCG/ACT nasal spray  Place 1-2 sprays into both nostrils daily as needed for allergies or rhinitis.    [provider]  furosemide (LASIX) 20 MG tablet Take 1 tablet (20 mg total) by mouth daily as needed (fluid retention.). Patient taking differently: Take 20 mg by mouth daily as needed for fluid (fluid retention.). 08/30/20  Pixie Casino, MD  gabapentin (NEURONTIN) 300 MG capsule Take 960m (3 capsules) by mouth three times daily Patient taking differently: Take 900 mg by mouth 3 (three) times daily. 09/20/20   Sater, RNanine Means MD  hydrochlorothiazide (HYDRODIURIL) 25 MG tablet Take 1 tablet (25 mg total) by mouth daily. 09/09/20 12/08/20  HPixie Casino MD  lisinopril (ZESTRIL) 40 MG tablet Take 1 tablet (40 mg total) by mouth daily. 10/10/20   VGeradine Girt DO  methocarbamol (ROBAXIN) 500 MG tablet TAKE 1 TABLET(500 MG) BY MOUTH THREE TIMES DAILY Patient taking differently: Take 500 mg by mouth 3 (three) times daily. 09/30/20   Sater, RNanine Means MD  nitroGLYCERIN (NITROSTAT) 0.4 MG SL tablet Place 1 tablet (0.4 mg total) under the tongue every 5 (five) minutes as needed for chest pain. 04/19/20   Hilty, KNadean Corwin MD  OXcarbazepine (TRILEPTAL) 300 MG tablet Take one pill qAM and two pills po qHS Patient taking differently: Take 300-600 mg by mouth See admin instructions. Take one pill in the morning and two pills at bedtime 09/20/20   Sater, RNanine Means MD  oxybutynin (DITROPAN XL) 10 MG 24 hr tablet Take 1 tablet (10 mg total) by mouth at bedtime. 08/23/20   Sater, RNanine Means MD  traMADol (ULTRAM) 50 MG tablet TAKE 1 TABLET(50 MG) BY MOUTH TWICE DAILY AS NEEDED Patient taking differently: Take 50 mg by mouth every 12 (twelve) hours as needed for moderate pain. 09/20/20   Sater, RNanine Means MD  venlafaxine XR (EFFEXOR-XR) 75 MG 24 hr capsule TAKE 1 CAPSULE(75 MG) BY MOUTH DAILY WITH BREAKFAST Patient taking differently: Take 75 mg by mouth daily. 08/20/20   GNicholas Lose MD   Physical Exam: Vitals  with BMI 10/14/2020 10/14/2020 10/14/2020  Height - - -  Weight - - -  BMI - - -  Systolic [id number] Diastolic 75 93 64  Pulse 88 81 96  Some encounter information is confidential and restricted. Go to Review Flowsheets activity to see all data.    1. General:  in No Acute distress     Chronically ill  -appearing 2. Psychological: Alert and  Oriented 3. Head/ENT:    Dry Mucous Membranes                          Head Non traumatic, neck supple                           Poor Dentition 4. SKIN: decreased Skin turgor,  Skin clean Dry and intact no rash 5. Heart: Regular rate and rhythm no  Murmur, no Rub or gallop 6. Lungs Clear to auscultation bilaterally, no wheezes or crackles   7. Abdomen: Soft non-tender, Non distended  bowel sounds present 8. Lower extremities: no clubbing, cyanosis, no   edema 9. Neurologically Grossly intact, moving all 4 extremities equally   10. MSK: Normal range of motion   All other LABS:     Recent Labs  Lab 10/08/20 1304 10/14/20 0934  WBC 11.8* 8.5  HGB 16.0* 13.7  HCT 44.1 37.4  MCV 89.8 90.1  PLT 409* 317     Recent Labs  Lab 10/08/20 1304 10/09/20 0350 10/14/20 0934  NA 134* 137 126*  K 2.9* 3.5 3.6  CL 98 101 91*  CO2 19* 24 25  GLUCOSE 101* 92 108*  BUN _0 CREATININE 0.95 1.13* 0.97  CALCIUM 10.3 9.8 9.2  MG  --  2.3  --      No results for input(s): AST, ALT, ALKPHOS, BILITOT, PROT, ALBUMIN in the last 168 hours.     Cultures:    Component Value Date/Time   SDES URINE, RANDOM 09/14/2014 0650   SPECREQUEST ADDED [id number] 09/14/2014 0650   CULT  09/14/2014 0650    ESCHERICHIA COLI Performed at [facility] 09/17/2014 FINAL 09/14/2014 0650     Radiological Exams on Admission: DG Chest 2 View  Result Date: 10/14/2020 CLINICAL DATA:  Chest pain, hypertensive for few days EXAM: CHEST - 2 VIEW COMPARISON:  10/08/2020 FINDINGS: Normal heart size, mediastinal contours, and pulmonary  vascularity. Coronary stents noted. Lungs clear. No pulmonary infiltrate, pleural effusion, or pneumothorax. Surgical clips in RIGHT breast and RIGHT axilla. No acute osseous findings. IMPRESSION: No acute abnormalities. Electronically Signed   By: Lavonia Dana M.D.   On: 10/14/2020 09:54    Chart has been reviewed    Assessment/Plan  50 y.o. female with medical history significant of breast cancer, chronic diastolic CHF, transverse myelitis, cervical spinal stenosis  neuropathy, chronic pain, coronary artery disease with stent in 2018, hypertension, and depression, GERD, HCOM Admitted for hypertensive urgency  Present on Admission:  . Hypertensive urgency -appreciate cardiology input continue home medicines but hold Lasix/hydrochlorothiazide given evidence of patient being dry. Obtain renal duplex ultrasound in a.m. Renal aldosterone studies.  . Hyperlipidemia -chronic stable continue home meds  . Chronic diastolic CHF (congestive heart failure) (HCC) -currently appears to be on the dry side will hold Lasix  . Chest pain -troponin unremarkable EKG nonischemic in the setting of hypertensive urgency.  Appreciate cardiology input  . CAD (coronary artery disease), native coronary artery chronic stable continue aspirin beta-blocker and statin  . Hyponatremia -appears to be on the dry side will obtain urine electrolytes gently rehydrate and follow, resolved after IVF ? erroneous labs?   Hypokalemia - replace and check magnesium level  hypophosphatemia - replace   . Transverse myelitis ([place]) chronic stable  Hx of Breast ca - continue home meds    Other plan as per orders.  DVT prophylaxis:  SCD      Code Status:    Code Status: Prior FULL CODE  as per patient   I had personally discussed CODE STATUS with patient    Family Communication:   Family not at  Bedside    Disposition Plan:      To home once workup is complete and patient is stable   Following barriers for  discharge:                            Electrolytes corrected                                                        Will need consultants to evaluate patient prior to discharge                     Consults called: cardiology is  Aware will follow  Admission status:  ED Disposition    ED Disposition Condition [place]: [place] [100100]  Level of Care: Telemetry Cardiac [103]  I  expect the patient will be discharged within 24 hours: No (not a candidate for 5C-Observation unit)  Covid Evaluation: Asymptomatic Screening Protocol (No Symptoms)  Diagnosis: Hypertensive urgency [010404]  Admitting Physician: Toy Baker [[id number]]  Attending Physician: Toy Baker [[id number]]        Obs    Level of care   tele  For  24H    Lab Results  Component Value Date   [place] 10/08/2020     Precautions: admitted as asymptomatic screening protocol  PPE: Used by the provider:   P100  eye Goggles,  Gloves     Rosiland Sen 10/14/2020, 10:51 PM    [facility]     after 2 AM please page floor coverage PA If 7AM-7PM, please contact the day team taking care of the patient using [email or web address]   Patient was evaluated in the context of the global COVID-19 pandemic, which necessitated consideration that the patient might be at risk for infection with the SARS-CoV-2 virus that causes COVID-19. Institutional protocols and algorithms that pertain to the evaluation of patients at risk for COVID-19 are in a state of rapid change based on information released by regulatory bodies including the CDC and federal and state organizations. These policies and algorithms were followed during the patient's care.

## 2020-10-14 NOTE — Progress Notes (Signed)
Attempted to contact Emilee RN @ [phone] multiple times and was unable to reach her regarding the patient being NPO for renal artery duplex in AM.  Department with attempt in am

## 2020-10-15 ENCOUNTER — Observation Stay (HOSPITAL_BASED_OUTPATIENT_CLINIC_OR_DEPARTMENT_OTHER): Payer: 59

## 2020-10-15 DIAGNOSIS — I10 Essential (primary) hypertension: Secondary | ICD-10-CM

## 2020-10-15 DIAGNOSIS — I160 Hypertensive urgency: Secondary | ICD-10-CM | POA: Diagnosis not present

## 2020-10-15 DIAGNOSIS — I5032 Chronic diastolic (congestive) heart failure: Secondary | ICD-10-CM | POA: Diagnosis not present

## 2020-10-15 DIAGNOSIS — R079 Chest pain, unspecified: Secondary | ICD-10-CM | POA: Diagnosis not present

## 2020-10-15 DIAGNOSIS — I2510 Atherosclerotic heart disease of native coronary artery without angina pectoris: Secondary | ICD-10-CM | POA: Diagnosis not present

## 2020-10-15 DIAGNOSIS — I150 Renovascular hypertension: Secondary | ICD-10-CM | POA: Diagnosis not present

## 2020-10-15 DIAGNOSIS — E785 Hyperlipidemia, unspecified: Secondary | ICD-10-CM | POA: Diagnosis not present

## 2020-10-15 DIAGNOSIS — E876 Hypokalemia: Secondary | ICD-10-CM

## 2020-10-15 LAB — COMPREHENSIVE METABOLIC PANEL
ALT: 29 U/L (ref 0–44)
AST: 32 U/L (ref 15–41)
Albumin: 3.1 g/dL — ABNORMAL LOW (ref 3.5–5.0)
Alkaline Phosphatase: 74 U/L (ref 38–126)
Anion gap: 8 (ref 5–15)
BUN: 5 mg/dL — ABNORMAL LOW (ref 6–20)
CO2: 23 mmol/L (ref 22–32)
Calcium: 8.8 mg/dL — ABNORMAL LOW (ref 8.9–10.3)
Chloride: 103 mmol/L (ref 98–111)
Creatinine, Ser: 0.9 mg/dL (ref 0.44–1.00)
GFR, Estimated: >60 mL/min (ref >60)
Glucose, Bld: 140 mg/dL — ABNORMAL HIGH (ref 70–99)
Potassium: 4 mmol/L (ref 3.5–5.1)
Sodium: 134 mmol/L — ABNORMAL LOW (ref 135–145)
Total Bilirubin: 0.4 mg/dL (ref 0.3–1.2)
Total Protein: 5.7 g/dL — ABNORMAL LOW (ref 6.5–8.1)

## 2020-10-15 LAB — MAGNESIUM: Magnesium: 2.2 mg/dL (ref 1.7–2.4)

## 2020-10-15 LAB — CBC WITH DIFFERENTIAL/PLATELET
Abs Immature Granulocytes: 0.03 10*3/uL (ref 0.00–0.07)
Basophils Absolute: 0 10*3/uL (ref 0.0–0.1)
Basophils Relative: 1 %
Eosinophils Absolute: 0.1 10*3/uL (ref 0.0–0.5)
Eosinophils Relative: 2 %
HCT: 35.1 % — ABNORMAL LOW (ref 36.0–46.0)
Hemoglobin: 12.1 g/dL (ref 12.0–15.0)
Immature Granulocytes: 0 %
Lymphocytes Relative: 26 %
Lymphs Abs: 2 10*3/uL (ref 0.7–4.0)
MCH: 31.8 pg (ref 26.0–34.0)
MCHC: 34.5 g/dL (ref 30.0–36.0)
MCV: 92.1 fL (ref 80.0–100.0)
Monocytes Absolute: 0.7 10*3/uL (ref 0.1–1.0)
Monocytes Relative: 8 %
Neutro Abs: 4.9 10*3/uL (ref 1.7–7.7)
Neutrophils Relative %: 63 %
Platelets: 283 10*3/uL (ref 150–400)
RBC: 3.81 MIL/uL — ABNORMAL LOW (ref 3.87–5.11)
RDW: 13.3 % (ref 11.5–15.5)
WBC: 7.8 10*3/uL (ref 4.0–10.5)
nRBC: 0 % (ref 0.0–0.2)

## 2020-10-15 LAB — ECHOCARDIOGRAM COMPLETE
Area-P 1/2: 4.39 cm2
Calc EF: 62.9 %
Height: 64 in
S' Lateral: 2.4 cm
Single Plane A2C EF: 62.6 %
Single Plane A4C EF: 63.6 %
Weight: 2400 oz

## 2020-10-15 LAB — TSH: TSH: 0.573 u[IU]/mL (ref 0.350–4.500)

## 2020-10-15 LAB — SARS CORONAVIRUS 2 (TAT 6-24 HRS): SARS Coronavirus 2: NEGATIVE

## 2020-10-15 LAB — PHOSPHORUS: Phosphorus: 2.4 mg/dL — ABNORMAL LOW (ref 2.5–4.6)

## 2020-10-15 MED ORDER — OXCARBAZEPINE 150 MG PO TABS
300.0000 mg | ORAL_TABLET | Freq: Every day | ORAL | Status: DC
  Administered 2020-10-16: 300 mg via ORAL
  Filled 2020-10-15: qty 2

## 2020-10-15 MED ORDER — SPIRONOLACTONE 12.5 MG HALF TABLET
12.5000 mg | ORAL_TABLET | Freq: Every day | ORAL | Status: DC
  Administered 2020-10-15 – 2020-10-16 (×2): 12.5 mg via ORAL
  Filled 2020-10-15 (×2): qty 1

## 2020-10-15 MED ORDER — METOPROLOL TARTRATE 50 MG PO TABS
50.0000 mg | ORAL_TABLET | Freq: Two times a day (BID) | ORAL | 2 refills | Status: DC
Start: 2020-10-15 — End: 2021-08-12

## 2020-10-15 MED ORDER — SPIRONOLACTONE 25 MG PO TABS: 12.5000 mg | ORAL_TABLET | Freq: Every day | ORAL | 2 refills | Status: DC

## 2020-10-15 MED ORDER — SODIUM CHLORIDE 0.9 % IV SOLN
75.0000 mL/h | INTRAVENOUS | Status: AC
  Administered 2020-10-15: 75 mL/h via INTRAVENOUS

## 2020-10-15 MED ORDER — HYDROCODONE-ACETAMINOPHEN 5-325 MG PO TABS
1.0000 | ORAL_TABLET | ORAL | Status: DC | PRN
  Administered 2020-10-15 – 2020-10-16 (×3): 1 via ORAL
  Filled 2020-10-15 (×3): qty 1

## 2020-10-15 MED ORDER — ANASTROZOLE 1 MG PO TABS
1.0000 mg | ORAL_TABLET | Freq: Every day | ORAL | Status: DC
  Administered 2020-10-15 – 2020-10-16 (×2): 1 mg via ORAL
  Filled 2020-10-15 (×2): qty 1

## 2020-10-15 MED ORDER — ACETAMINOPHEN 650 MG RE SUPP: 650.0000 mg | Freq: Four times a day (QID) | RECTAL | Status: DC | PRN

## 2020-10-15 MED ORDER — AMLODIPINE BESYLATE 10 MG PO TABS
10.0000 mg | ORAL_TABLET | Freq: Every day | ORAL | Status: DC
  Administered 2020-10-15 – 2020-10-16 (×2): 10 mg via ORAL
  Filled 2020-10-15: qty 1
  Filled 2020-10-15: qty 2

## 2020-10-15 MED ORDER — BACLOFEN 10 MG PO TABS
10.0000 mg | ORAL_TABLET | Freq: Four times a day (QID) | ORAL | Status: DC | PRN
  Filled 2020-10-15: qty 1

## 2020-10-15 MED ORDER — GABAPENTIN 300 MG PO CAPS
900.0000 mg | ORAL_CAPSULE | Freq: Three times a day (TID) | ORAL | Status: DC
  Administered 2020-10-15 – 2020-10-16 (×3): 900 mg via ORAL
  Filled 2020-10-15 (×3): qty 3

## 2020-10-15 MED ORDER — OXCARBAZEPINE 150 MG PO TABS
600.0000 mg | ORAL_TABLET | Freq: Every day | ORAL | Status: DC
  Administered 2020-10-15 (×2): 600 mg via ORAL
  Filled 2020-10-15 (×2): qty 2

## 2020-10-15 MED ORDER — OXCARBAZEPINE 300 MG PO TABS: 300.0000 mg | ORAL_TABLET | ORAL | Status: DC

## 2020-10-15 MED ORDER — ACETAMINOPHEN 325 MG PO TABS
650.0000 mg | ORAL_TABLET | Freq: Four times a day (QID) | ORAL | Status: DC | PRN
  Administered 2020-10-15: 650 mg via ORAL
  Filled 2020-10-15: qty 2

## 2020-10-15 MED ORDER — ONDANSETRON HCL 4 MG/2ML IJ SOLN: 4.0000 mg | Freq: Four times a day (QID) | INTRAMUSCULAR | Status: DC | PRN

## 2020-10-15 MED ORDER — ATORVASTATIN CALCIUM 80 MG PO TABS
80.0000 mg | ORAL_TABLET | Freq: Every day | ORAL | Status: DC
  Administered 2020-10-15 – 2020-10-16 (×2): 80 mg via ORAL
  Filled 2020-10-15 (×2): qty 1

## 2020-10-15 MED ORDER — METOPROLOL TARTRATE 25 MG PO TABS
50.0000 mg | ORAL_TABLET | Freq: Two times a day (BID) | ORAL | Status: DC
Start: 2020-10-15 — End: 2020-10-15

## 2020-10-15 MED ORDER — ASPIRIN 81 MG PO CHEW
81.0000 mg | CHEWABLE_TABLET | Freq: Every day | ORAL | Status: DC
  Administered 2020-10-15 – 2020-10-16 (×2): 81 mg via ORAL
  Filled 2020-10-15 (×2): qty 1

## 2020-10-15 MED ORDER — METOPROLOL TARTRATE 25 MG PO TABS: 25.0000 mg | ORAL_TABLET | Freq: Two times a day (BID) | ORAL | Status: DC

## 2020-10-15 MED ORDER — LISINOPRIL 40 MG PO TABS
40.0000 mg | ORAL_TABLET | Freq: Every day | ORAL | Status: DC
  Administered 2020-10-15 – 2020-10-16 (×2): 40 mg via ORAL
  Filled 2020-10-15: qty 2
  Filled 2020-10-15: qty 1

## 2020-10-15 MED ORDER — METHOCARBAMOL 500 MG PO TABS
500.0000 mg | ORAL_TABLET | Freq: Three times a day (TID) | ORAL | Status: DC
  Administered 2020-10-15 – 2020-10-16 (×3): 500 mg via ORAL
  Filled 2020-10-15 (×3): qty 1

## 2020-10-15 MED ORDER — TRAMADOL HCL 50 MG PO TABS
50.0000 mg | ORAL_TABLET | Freq: Two times a day (BID) | ORAL | Status: DC | PRN
  Administered 2020-10-15: 50 mg via ORAL
  Filled 2020-10-15: qty 1

## 2020-10-15 MED ORDER — OXYBUTYNIN CHLORIDE ER 10 MG PO TB24
10.0000 mg | ORAL_TABLET | Freq: Every day | ORAL | Status: DC
  Administered 2020-10-15: 10 mg via ORAL
  Filled 2020-10-15 (×2): qty 1

## 2020-10-15 MED ORDER — HYDRALAZINE HCL 20 MG/ML IJ SOLN: 10.0000 mg | INTRAMUSCULAR | Status: DC | PRN

## 2020-10-15 NOTE — Progress Notes (Signed)
Progress Note  Patient Name: Anna Henson Date of Encounter: 10/15/2020  [facility] Cardiologist: Pixie Casino, MD   Subjective   Headache largely resolved. No angina or dyspnea. BP as low as 138 overnight, but now 183/92. No renal artery stenosis by [place] (incidental note of celiac artery stenosis). Normal/borderline hyperdynamic LVEF (65-70%), but without SAM or LVOT gradient, only a trickle of MR and only mildly reduced mitral annulus diastolic velocities (my review). K now normal. Creat 0.9. Na 134.  Inpatient Medications    Scheduled Meds: . amLODipine  10 mg Oral Daily  . anastrozole  1 mg Oral Daily  . aspirin  81 mg Oral Daily  . atorvastatin  80 mg Oral Daily  . gabapentin  900 mg Oral TID  . labetalol  20 mg Intravenous Once  . lisinopril  40 mg Oral Daily  . methocarbamol  500 mg Oral TID  . metoprolol tartrate  50 mg Oral BID  . Oxcarbazepine  300 mg Oral Daily  . OXcarbazepine  600 mg Oral QHS  . oxybutynin  10 mg Oral QHS   Continuous Infusions: . sodium chloride 75 mL/hr (10/15/20 0202)   PRN Meds: acetaminophen **OR** acetaminophen, baclofen, HYDROcodone-acetaminophen, traMADol   Vital Signs    Vitals:   10/15/20 0645 10/15/20 0700 10/15/20 0749 10/15/20 1015  BP: (!) 177/101 (!) 146/93  (!) 183/92  Pulse:  76  80  Resp: 19 14  16   Temp:   97.9 F (36.6 C)   TempSrc:   Oral   SpO2:  100%  98%  Weight:      Height:        Intake/Output Summary (Last 24 hours) at 10/15/2020 1109 Last data filed at 10/15/2020 0409 Gross per 24 hour  Intake 1100 ml  Output -  Net 1100 ml   Last 3 Weights 10/14/2020 10/09/2020 09/20/2020  Weight (lbs) 150 lb 146 lb 6.2 oz 149 lb  Weight (kg) 68.04 kg 66.4 kg 67.586 kg  Some encounter information is confidential and restricted. Go to Review Flowsheets activity to see all data.      Telemetry    NSR - Personally Reviewed  ECG    No new tracing - Personally Reviewed  Physical Exam  Appears well GEN: No  acute distress.   Neck: No JVD Cardiac: RRR, no murmurs, rubs, or gallops.  Respiratory: Clear to auscultation bilaterally. GI: Soft, nontender, non-distended  MS: No edema; No deformity. Neuro:  Nonfocal  Psych: Normal affect   Labs    High Sensitivity Troponin:   Recent Labs  Lab 10/08/20 1641 10/08/20 2120 10/09/20 0350 10/14/20 0934 10/14/20 1130  TROPONINIHS 20* 25* 24* 12 13      Chemistry Recent Labs  Lab 10/14/20 0934 10/14/20 1954 10/15/20 0419  NA 126* 135 134*  K 3.6 3.0* 4.0  CL 91* 102 103  CO2 25 25 23   GLUCOSE 108* 77 140*  BUN 9 5* 5*  CREATININE 0.97 0.85 0.90  CALCIUM 9.2 7.8* 8.8*  PROT  --  5.2* 5.7*  ALBUMIN  --  3.0* 3.1*  AST  --  24 32  ALT  --  24 29  ALKPHOS  --  72 74  BILITOT  --  0.3 0.4  GFRNONAA >60 >60 >60  ANIONGAP 10 8 8      Hematology Recent Labs  Lab 10/08/20 1304 10/14/20 0934 10/15/20 0419  WBC 11.8* 8.5 7.8  RBC 4.91 4.15 3.81*  HGB 16.0* 13.7 12.1  HCT 44.1 37.4 35.1*  MCV 89.8 90.1 92.1  MCH 32.6 33.0 31.8  MCHC 36.3* 36.6* 34.5  RDW 14.2 13.2 13.3  PLT 409* 317 283    BNP Recent Labs  Lab 10/14/20 2030  BNP 30.8     DDimer No results for input(s): DDIMER in the last 168 hours.   Radiology    DG Chest 2 View  Result Date: 10/14/2020 CLINICAL DATA:  Chest pain, hypertensive for few days EXAM: CHEST - 2 VIEW COMPARISON:  10/08/2020 FINDINGS: Normal heart size, mediastinal contours, and pulmonary vascularity. Coronary stents noted. Lungs clear. No pulmonary infiltrate, pleural effusion, or pneumothorax. Surgical clips in RIGHT breast and RIGHT axilla. No acute osseous findings. IMPRESSION: No acute abnormalities. Electronically Signed   By: Lavonia Dana M.D.   On: 10/14/2020 09:54   VAS US RENAL ARTERY DUPLEX  Result Date: 10/15/2020 ABDOMINAL VISCERAL Indications: Hypertension High Risk Factors: Hypertension, hyperlipidemia, coronary artery disease. Comparison Study: No prior study Performing  Technologist: Maudry Mayhew MHA, RDMS, RVT, RDCS  Examination Guidelines: A complete evaluation includes B-mode imaging, spectral Doppler, color Doppler, and power Doppler as needed of all accessible portions of each vessel. Bilateral testing is considered an integral part of a complete examination. Limited examinations for reoccurring indications may be performed as noted.  Duplex Findings: +--------------------+--------+--------+------+--------+ Mesenteric          PSV cm/sEDV cm/sPlaqueComments +--------------------+--------+--------+------+--------+ Aorta Prox             98      14                  +--------------------+--------+--------+------+--------+ Celiac Artery Origin  258      59                  +--------------------+--------+--------+------+--------+ SMA Proximal          198      23                  +--------------------+--------+--------+------+--------+    +------------------+--------+--------+-------+ Right Renal ArteryPSV cm/sEDV cm/sComment +------------------+--------+--------+-------+ Origin               83      30           +------------------+--------+--------+-------+ Proximal            179      57           +------------------+--------+--------+-------+ Mid                 175      48           +------------------+--------+--------+-------+ Distal              178      42           +------------------+--------+--------+-------+ +-----------------+--------+--------+-------+ Left Renal ArteryPSV cm/sEDV cm/sComment +-----------------+--------+--------+-------+ Origin             351      88           +-----------------+--------+--------+-------+ Proximal            91      21           +-----------------+--------+--------+-------+ Mid                 87      17           +-----------------+--------+--------+-------+ Distal  82      18           +-----------------+--------+--------+-------+  +------------+--------+--------+----+-----------+--------+--------+----+ Right KidneyPSV cm/sEDV cm/sRI  Left KidneyPSV cm/sEDV cm/sRI   +------------+--------+--------+----+-----------+--------+--------+----+ Upper Pole  30      9       0.71Upper Pole 45      15      0.68 +------------+--------+--------+----+-----------+--------+--------+----+ Mid         31      9       0.72Mid        33      13      0.61 +------------+--------+--------+----+-----------+--------+--------+----+ Lower Pole  20      7       0.64Lower Pole 24      7       0.70 +------------+--------+--------+----+-----------+--------+--------+----+ Hilar       86      20      0.76Hilar      35      11      0.68 +------------+--------+--------+----+-----------+--------+--------+----+ +------------------+---------+------------------+----------+ Right Kidney               Left Kidney                  +------------------+---------+------------------+----------+ RAR                        RAR                          +------------------+---------+------------------+----------+ RAR (manual)      1.83     RAR (manual)      3.58       +------------------+---------+------------------+----------+ Cortex            19/6 cm/sCortex            24/10 cm/s +------------------+---------+------------------+----------+ Cortex thickness           Corex thickness              +------------------+---------+------------------+----------+ Kidney length (cm)10.04    Kidney length (cm)9.95       +------------------+---------+------------------+----------+  Summary: Renal:  Right: No evidence of right renal artery stenosis. RRV flow present. 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 LRV        flow present. Mesenteric: Normal Superior Mesenteric artery findings. 70 to 99% stenosis in the celiac artery.  *See table(s) above for measurements and observations.     Preliminary      Cardiac Studies   Echo (my preliminary review, see above)  Patient Profile     50 y.o. female with severe HTN, hx of premature onset CAD and PAD, hypercholesterolemia, history of diastolic HF admitted with severe headache and elevated BP. Strong FHx of both severe HTN and premature CAD in female relatives (mother and maternal GM).  Assessment & Plan    No evidence for acute HF or coronary insufficiency. She does not have renal artery stenosis. 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  Symptoms and metabolic abnormalities have largely resolved.  Metoprolol dose increased last night - there is probably room to increase further and this should be the preferential agent for BP control. Option to switch to carvedilol if bradycardia limits metoprolol dose. Add low dose spironolactone to prevent hypokalemia and for additional BP reduction. Continue max dose  amlodipine and lisinopril. Reinforce the need for compliance with statin. Target LDL<70 (was close to that last year).  [facility] will sign off.   Medication Recommendations:   Spironolactone 12.5 mg daily (new) Metoprolol 50 mg BID (new dose) Lisinopril 40 mg daily Amlodipine 10 mg daily ASA 81 mg daily Atorvastatin 80 mg daily Avoid daily NSAID use Other recommendations (labs, testing, etc):  Sodium restricted, lo saturated fat, low cholesterol diet. Repeat BMET in 2-4 weeks (can do at our office) Follow up as an outpatient:  Has an appt w Dr. Debara Pickett already scheduled for 03/15, but will bring back to see [place] (APP, clinical pharmacist or maybe HTN clinic in 3-4 weeks).  For questions or updates, please contact [place] Please consult [email or web address] for contact info under        Signed, Sanda Klein, MD  10/15/2020, 11:09 AM

## 2020-10-15 NOTE — ED Notes (Signed)
Paged attending for RN 

## 2020-10-15 NOTE — ED Notes (Signed)
Pt transport to vascular

## 2020-10-15 NOTE — Progress Notes (Signed)
  Echocardiogram 2D Echocardiogram has been performed.  Geoffery Lyons Swaim 10/15/2020, 9:50 AM

## 2020-10-15 NOTE — ED Notes (Addendum)
Pt angry and out to desk that she pressed her call bell and no one answered. I apologized and assisted patient back to bed. Pt states she will call her boyfriend herself. Ambulated to bathroom. I placed patient back on cardiac monitor. Pt denies any other needs at this time.

## 2020-10-15 NOTE — Discharge Summary (Signed)
Physician Discharge Summary  Elonda Giuliano XTG:[id number] DOB: 06/17/1971 DOA: 10/14/2020  PCP: Patient, No Pcp Per  Admit date: 10/14/2020  Discharge date: 10/15/2020  Admitted From:Home  Disposition:  Home  Recommendations for Outpatient Follow-up:  1. Follow up with PCP in 1-2 weeks 2. Follow-up with cardiology Dr. Debara Pickett in the next 2-3 weeks to review and reevaluate blood pressure 3. Started on spironolactone 12.5 mg daily and increase metoprolol home dose to 50 mg twice daily.  Continue on home lisinopril and amlodipine and limit NSAID use.  Lasix as needed and hydrochlorothiazide discontinued per cardiology recommendations 4. 2D echocardiogram with LVEF 65-70% and no other acute findings.  No significant findings of renal artery stenosis noted on ultrasound.  Home Health: None  Equipment/Devices: None  Discharge Condition:Stable  CODE STATUS: Full  Diet recommendation: Heart Healthy  Brief/Interim Summary: Anna Henson is a 50 y.o. female with medical history significant of breast cancer, chronic diastolic CHF, transverse myelitis, cervical spinal stenosis neuropathy, chronic pain, coronary artery disease with stent in 2018, hypertension, GERD, HOCM, and depression who presented to the ED with elevated blood pressure readings along with associated chest pain and headache.  She was admitted with hypertensive urgency and was seen by cardiology with recommendations to get renal ultrasound which did not demonstrate any significant findings of renal artery stenosis.  2D echocardiogram also performed with no significant findings.  Cardiology has evaluated this patient with recommendations to increase metoprolol dosing to 50 mg twice daily and to start on spironolactone.  Recommendations to limit NSAID use and follow-up with cardiology or hypertension clinic in 3-4 weeks.  Patient's symptoms have otherwise resolved and she is in stable condition for discharge today.  Discharge Diagnoses:   Active Problems:   CAD (coronary artery disease), native coronary artery   Hyperlipidemia   Transverse myelitis (HCC)   Malignant neoplasm of lower-outer quadrant of right breast of female, estrogen receptor positive (HCC)   Chest pain   Hypertensive urgency   Chronic diastolic CHF (congestive heart failure) ([place])   Hyponatremia    Discharge Instructions  Discharge Instructions    Diet - low sodium heart healthy   Complete by: As directed    Increase activity slowly   Complete by: As directed      Allergies as of 10/15/2020      Reactions   Pork-derived Products Other (See Comments)   Does NOT eat pork      Medication List    STOP taking these medications   hydrochlorothiazide 25 MG tablet Commonly known as: HYDRODIURIL     TAKE these medications   acetaminophen 325 MG tablet Commonly known as: TYLENOL Take 325-650 mg by mouth every 8 (eight) hours as needed for moderate pain.   amLODipine 10 MG tablet Commonly known as: NORVASC TAKE 1 TABLET(10 MG) BY MOUTH DAILY What changed: See the new instructions.   anastrozole 1 MG tablet Commonly known as: ARIMIDEX Take 1 tablet (1 mg total) by mouth daily.   aspirin 81 MG chewable tablet Chew 1 tablet (81 mg total) by mouth daily.   atorvastatin 80 MG tablet Commonly known as: LIPITOR TAKE 1 TABLET(80 MG) BY MOUTH DAILY What changed: See the new instructions.   baclofen 10 MG tablet Commonly known as: LIORESAL TAKE BY MOUTH UP TO 4 TABLETS EVERY DAY FOR MUSCLE SPASMS What changed: See the new instructions.   etodolac 400 MG tablet Commonly known as: LODINE Take 1 tablet (400 mg total) by mouth 2 (two) times daily.  ferrous sulfate 325 (65 FE) MG tablet Take 325 mg by mouth daily.   fluticasone 50 MCG/ACT nasal spray Commonly known as: FLONASE Place 1-2 sprays into both nostrils daily as needed for allergies or rhinitis.   furosemide 20 MG tablet Commonly known as: LASIX Take 1 tablet (20 mg total) by  mouth daily as needed (fluid retention.). What changed: reasons to take this   gabapentin 300 MG capsule Commonly known as: NEURONTIN Take 900mg  (3 capsules) by mouth three times daily What changed:   how much to take  how to take this  when to take this  additional instructions   lisinopril 40 MG tablet Commonly known as: ZESTRIL Take 1 tablet (40 mg total) by mouth daily.   methocarbamol 500 MG tablet Commonly known as: ROBAXIN TAKE 1 TABLET(500 MG) BY MOUTH THREE TIMES DAILY What changed: See the new instructions.   metoprolol tartrate 50 MG tablet Commonly known as: LOPRESSOR Take 1 tablet (50 mg total) by mouth 2 (two) times daily. What changed:   medication strength  how much to take   nitroGLYCERIN 0.4 MG SL tablet Commonly known as: NITROSTAT Place 1 tablet (0.4 mg total) under the tongue every 5 (five) minutes as needed for chest pain.   Oxcarbazepine 300 MG tablet Commonly known as: TRILEPTAL Take one pill qAM and two pills po qHS What changed:   how much to take  how to take this  when to take this  additional instructions   oxybutynin 10 MG 24 hr tablet Commonly known as: Ditropan XL Take 1 tablet (10 mg total) by mouth at bedtime.   spironolactone 25 MG tablet Commonly known as: Aldactone Take 0.5 tablets (12.5 mg total) by mouth daily.   SWIMMERS EAR DROPS OT Place 2 drops into both ears as needed (water in ears).   traMADol 50 MG tablet Commonly known as: ULTRAM TAKE 1 TABLET(50 MG) BY MOUTH TWICE DAILY AS NEEDED What changed:   how much to take  how to take this  when to take this  reasons to take this  additional instructions   venlafaxine XR 75 MG 24 hr capsule Commonly known as: EFFEXOR-XR TAKE 1 CAPSULE(75 MG) BY MOUTH DAILY WITH BREAKFAST What changed: See the new instructions.       Follow-up Information    Hilty, Nadean Corwin, MD Follow up in 2 week(s).   Specialty: Cardiology Contact information: [place] [address] [phone]              Allergies  Allergen Reactions  . Pork-Derived Products Other (See Comments)    Does NOT eat pork    Consultations:  Cardiology   Procedures/Studies: DG Chest 2 View  Result Date: 10/14/2020 CLINICAL DATA:  Chest pain, hypertensive for few days EXAM: CHEST - 2 VIEW COMPARISON:  10/08/2020 FINDINGS: Normal heart size, mediastinal contours, and pulmonary vascularity. Coronary stents noted. Lungs clear. No pulmonary infiltrate, pleural effusion, or pneumothorax. Surgical clips in RIGHT breast and RIGHT axilla. No acute osseous findings. IMPRESSION: No acute abnormalities. Electronically Signed   By: Lavonia Dana M.D.   On: 10/14/2020 09:54   DG Chest 2 View  Result Date: 10/08/2020 CLINICAL DATA:  Chest pain today.  Hypertension. EXAM: CHEST - 2 VIEW COMPARISON:  PA and lateral chest 11/14/2019 FINDINGS: Lungs clear. Heart size normal. Coronary artery stent noted. No pneumothorax or pleural effusion. Surgical clips in the right breast and axilla are again seen. No acute or focal bony  abnormality IMPRESSION: No acute disease. Electronically Signed   By: Inge Rise M.D.   On: 10/08/2020 13:17   VAS US RENAL ARTERY DUPLEX  Result Date: 10/15/2020 ABDOMINAL VISCERAL Indications: Hypertension High Risk Factors: Hypertension, hyperlipidemia, coronary artery disease. Comparison Study: No prior study Performing Technologist: Maudry Mayhew MHA, RDMS, RVT, RDCS  Examination Guidelines: A complete evaluation includes B-mode imaging, spectral Doppler, color Doppler, and power Doppler as needed of all accessible portions of each vessel. Bilateral testing is considered an integral part of a complete examination. Limited examinations for reoccurring indications may be performed as noted.  Duplex Findings: +--------------------+--------+--------+------+--------+ Mesenteric          PSV cm/sEDV cm/sPlaqueComments  +--------------------+--------+--------+------+--------+ Aorta Prox             98      14                  +--------------------+--------+--------+------+--------+ Celiac Artery Origin  258      59                  +--------------------+--------+--------+------+--------+ SMA Proximal          198      23                  +--------------------+--------+--------+------+--------+    +------------------+--------+--------+-------+ Right Renal ArteryPSV cm/sEDV cm/sComment +------------------+--------+--------+-------+ Origin               83      30           +------------------+--------+--------+-------+ Proximal            179      57           +------------------+--------+--------+-------+ Mid                 175      48           +------------------+--------+--------+-------+ Distal              178      42           +------------------+--------+--------+-------+ +-----------------+--------+--------+-------+ Left Renal ArteryPSV cm/sEDV cm/sComment +-----------------+--------+--------+-------+ Origin             351      88           +-----------------+--------+--------+-------+ Proximal            91      21           +-----------------+--------+--------+-------+ Mid                 87      17           +-----------------+--------+--------+-------+ Distal              82      18           +-----------------+--------+--------+-------+ +------------+--------+--------+----+-----------+--------+--------+----+ Right KidneyPSV cm/sEDV cm/sRI  Left KidneyPSV cm/sEDV cm/sRI   +------------+--------+--------+----+-----------+--------+--------+----+ Upper Pole  30      9       0.71Upper Pole 45      15      0.68 +------------+--------+--------+----+-----------+--------+--------+----+ Mid         31      9       0.72Mid        33      13      0.61 +------------+--------+--------+----+-----------+--------+--------+----+ Lower Pole   20      7  0.64Lower Pole 24      7       0.70 +------------+--------+--------+----+-----------+--------+--------+----+ Hilar       86      20      0.76Hilar      35      11      0.68 +------------+--------+--------+----+-----------+--------+--------+----+ +------------------+---------+------------------+----------+ Right Kidney               Left Kidney                  +------------------+---------+------------------+----------+ RAR                        RAR                          +------------------+---------+------------------+----------+ RAR (manual)      1.83     RAR (manual)      3.58       +------------------+---------+------------------+----------+ Cortex            19/6 cm/sCortex            24/10 cm/s +------------------+---------+------------------+----------+ Cortex thickness           Corex thickness              +------------------+---------+------------------+----------+ Kidney length (cm)10.04    Kidney length (cm)9.95       +------------------+---------+------------------+----------+  Summary: Renal:  Right: No evidence of right renal artery stenosis. RRV flow present. 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 LRV        flow present. Mesenteric: Normal Superior Mesenteric artery findings. 70 to 99% stenosis in the celiac artery.  *See table(s) above for measurements and observations.     Preliminary       Discharge Exam: Vitals:   10/15/20 0749 10/15/20 1015  BP:  (!) 183/92  Pulse:  80  Resp:  16  Temp: 97.9 F (36.6 C)   SpO2:  98%   Vitals:   10/15/20 0645 10/15/20 0700 10/15/20 0749 10/15/20 1015  BP: (!) 177/101 (!) 146/93  (!) 183/92  Pulse:  76  80  Resp: 19 14  16   Temp:   97.9 F (36.6 C)   TempSrc:   Oral   SpO2:  100%  98%  Weight:      Height:        General: Pt is alert, awake, not in acute distress Cardiovascular: RRR, S1/S2 +, no rubs, no gallops Respiratory: CTA bilaterally, no  wheezing, no rhonchi Abdominal: Soft, NT, ND, bowel sounds + Extremities: no edema, no cyanosis    The results of significant diagnostics from this hospitalization (including imaging, microbiology, ancillary and laboratory) are listed below for reference.     Microbiology: Recent Results (from the past 240 hour(s))  Resp Panel by RT-PCR (Flu A&B, Covid) Nasopharyngeal Swab     Status: None   Collection Time: 10/08/20  8:18 PM   Specimen: Nasopharyngeal Swab; Nasopharyngeal(NP) swabs in vial transport medium  Result Value Ref Range Status   SARS Coronavirus 2 by RT PCR NEGATIVE NEGATIVE Final    Comment: (NOTE) SARS-CoV-2 target nucleic acids are NOT DETECTED.  The SARS-CoV-2 RNA is generally detectable in upper respiratory specimens during the acute phase of infection. The lowest concentration of SARS-CoV-2 viral copies this assay can detect is 138 copies/mL. A negative result does not preclude SARS-Cov-2 infection and should not be used as the sole basis  for treatment or other patient management decisions. A negative result may occur with  improper specimen collection/handling, submission of specimen other than nasopharyngeal swab, presence of viral mutation(s) within the areas targeted by this assay, and inadequate number of viral copies(<138 copies/mL). A negative result must be combined with clinical observations, patient history, and epidemiological information. The expected result is Negative.  Fact Sheet for Patients:  EntrepreneurPulse.com.au  Fact Sheet for Healthcare Providers:  IncredibleEmployment.be  This test is no t yet approved or cleared by the [place] FDA and  has been authorized for detection and/or diagnosis of SARS-CoV-2 by FDA under an Emergency Use Authorization (EUA). This EUA will remain  in effect (meaning this test can be used) for the duration of the COVID-19 declaration under Section 564(b)(1) of the Act,  21 U.S.C.section 360bbb-3(b)(1), unless the authorization is terminated  or revoked sooner.       Influenza A by PCR NEGATIVE NEGATIVE Final   Influenza B by PCR NEGATIVE NEGATIVE Final    Comment: (NOTE) The Xpert Xpress SARS-CoV-2/FLU/RSV plus assay is intended as an aid in the diagnosis of influenza from Nasopharyngeal swab specimens and should not be used as a sole basis for treatment. Nasal washings and aspirates are unacceptable for Xpert Xpress SARS-CoV-2/FLU/RSV testing.  Fact Sheet for Patients: EntrepreneurPulse.com.au  Fact Sheet for Healthcare Providers: IncredibleEmployment.be  This test is not yet approved or cleared by the [place] FDA and has been authorized for detection and/or diagnosis of SARS-CoV-2 by FDA under an Emergency Use Authorization (EUA). This EUA will remain in effect (meaning this test can be used) for the duration of the COVID-19 declaration under Section 564(b)(1) of the Act, 21 U.S.C. section 360bbb-3(b)(1), unless the authorization is terminated or revoked.  Performed at [facility], [place] [address], [place], [place] [address]   SARS CORONAVIRUS 2 (TAT 6-24 HRS) Nasopharyngeal     Status: None   Collection Time: 10/14/20  7:50 PM   Specimen: Nasopharyngeal  Result Value Ref Range Status   SARS Coronavirus 2 NEGATIVE NEGATIVE Final    Comment: (NOTE) SARS-CoV-2 target nucleic acids are NOT DETECTED.  The SARS-CoV-2 RNA is generally detectable in upper and lower respiratory specimens during the acute phase of infection. Negative results do not preclude SARS-CoV-2 infection, do not rule out co-infections with other pathogens, and should not be used as the sole basis for treatment or other patient management decisions. Negative results must be combined with clinical observations, patient history, and epidemiological information. The expected result is Negative.  Fact Sheet for  Patients: SugarRoll.be  Fact Sheet for Healthcare Providers: https://www.woods-mathews.com/  This test is not yet approved or cleared by the [place] FDA and  has been authorized for detection and/or diagnosis of SARS-CoV-2 by FDA under an Emergency Use Authorization (EUA). This EUA will remain  in effect (meaning this test can be used) for the duration of the COVID-19 declaration under Se ction 564(b)(1) of the Act, 21 U.S.C. section 360bbb-3(b)(1), unless the authorization is terminated or revoked sooner.  Performed at [facility], [place] [address], [place], [place] [address]      Labs: BNP (last 3 results) Recent Labs    10/14/20 2030  BNP [id number]   Basic Metabolic Panel: Recent Labs  Lab 10/08/20 1304 10/09/20 0350 10/14/20 0934 10/14/20 1954 10/15/20 0419  NA 134* 137 126* 135 134*  K 2.9* 3.5 3.6 3.0* 4.0  CL 98 101 91* 102 103  CO2 19* 24 25 25  23  GLUCOSE 101* 92 108* 77 140*  BUN 11 14 9  5* 5*  CREATININE 0.95 1.13* 0.97 0.85 0.90  CALCIUM 10.3 9.8 9.2 7.8* 8.8*  MG  --  2.3  --  1.8 2.2  PHOS  --   --   --  2.0* 2.4*   Liver Function Tests: Recent Labs  Lab 10/14/20 1954 10/15/20 0419  AST 24 32  ALT 24 29  ALKPHOS 72 74  BILITOT 0.3 0.4  PROT 5.2* 5.7*  ALBUMIN 3.0* 3.1*   Recent Labs  Lab 10/08/20 2120  LIPASE 24   No results for input(s): AMMONIA in the last 168 hours. CBC: Recent Labs  Lab 10/08/20 1304 10/14/20 0934 10/15/20 0419  WBC 11.8* 8.5 7.8  NEUTROABS  --   --  4.9  HGB 16.0* 13.7 12.1  HCT 44.1 37.4 35.1*  MCV 89.8 90.1 92.1  PLT 409* 317 283   Cardiac Enzymes: Recent Labs  Lab 10/14/20 1954  CKTOTAL 102   BNP: Invalid input(s): POCBNP CBG: No results for input(s): GLUCAP in the last 168 hours. D-Dimer No results for input(s): DDIMER in the last 72 hours. Hgb A1c No results for input(s): HGBA1C in the last 72 hours. Lipid Profile No results for input(s):  CHOL, HDL, LDLCALC, TRIG, CHOLHDL, LDLDIRECT in the last 72 hours. Thyroid function studies Recent Labs    10/15/20 0419  TSH 0.573   Anemia work up No results for input(s): VITAMINB12, FOLATE, FERRITIN, TIBC, IRON, RETICCTPCT in the last 72 hours. Urinalysis    Component Value Date/Time   COLORURINE STRAW (A) 03/23/2020 1541   APPEARANCEUR CLEAR 03/23/2020 1541   LABSPEC 1.006 03/23/2020 1541   PHURINE 5.0 03/23/2020 1541   GLUCOSEU NEGATIVE 03/23/2020 1541   HGBUR NEGATIVE 03/23/2020 1541   BILIRUBINUR NEGATIVE 03/23/2020 1541   BILIRUBINUR negative 11/29/2016 1703   BILIRUBINUR neg 01/16/2015 0940   KETONESUR NEGATIVE 03/23/2020 1541   PROTEINUR NEGATIVE 03/23/2020 1541   UROBILINOGEN 0.2 11/29/2016 1703   UROBILINOGEN 1.0 09/13/2014 1529   NITRITE NEGATIVE 03/23/2020 1541   LEUKOCYTESUR NEGATIVE 03/23/2020 1541   Sepsis Labs Invalid input(s): PROCALCITONIN,  WBC,  LACTICIDVEN Microbiology Recent Results (from the past 240 hour(s))  Resp Panel by RT-PCR (Flu A&B, Covid) Nasopharyngeal Swab     Status: None   Collection Time: 10/08/20  8:18 PM   Specimen: Nasopharyngeal Swab; Nasopharyngeal(NP) swabs in vial transport medium  Result Value Ref Range Status   SARS Coronavirus 2 by RT PCR NEGATIVE NEGATIVE Final    Comment: (NOTE) SARS-CoV-2 target nucleic acids are NOT DETECTED.  The SARS-CoV-2 RNA is generally detectable in upper respiratory specimens during the acute phase of infection. The lowest concentration of SARS-CoV-2 viral copies this assay can detect is 138 copies/mL. A negative result does not preclude SARS-Cov-2 infection and should not be used as the sole basis for treatment or other patient management decisions. A negative result may occur with  improper specimen collection/handling, submission of specimen other than nasopharyngeal swab, presence of viral mutation(s) within the areas targeted by this assay, and inadequate number of viral copies(<138  copies/mL). A negative result must be combined with clinical observations, patient history, and epidemiological information. The expected result is Negative.  Fact Sheet for Patients:  BloggerCourse.com  Fact Sheet for Healthcare Providers:  SeriousBroker.it  This test is no t yet approved or cleared by the [place] FDA and  has been authorized for detection and/or diagnosis of SARS-CoV-2 by FDA under an Emergency Use  Authorization (EUA). This EUA will remain  in effect (meaning this test can be used) for the duration of the COVID-19 declaration under Section 564(b)(1) of the Act, 21 U.S.C.section 360bbb-3(b)(1), unless the authorization is terminated  or revoked sooner.       Influenza A by PCR NEGATIVE NEGATIVE Final   Influenza B by PCR NEGATIVE NEGATIVE Final    Comment: (NOTE) The Xpert Xpress SARS-CoV-2/FLU/RSV plus assay is intended as an aid in the diagnosis of influenza from Nasopharyngeal swab specimens and should not be used as a sole basis for treatment. Nasal washings and aspirates are unacceptable for Xpert Xpress SARS-CoV-2/FLU/RSV testing.  Fact Sheet for Patients: EntrepreneurPulse.com.au  Fact Sheet for Healthcare Providers: IncredibleEmployment.be  This test is not yet approved or cleared by the [place] FDA and has been authorized for detection and/or diagnosis of SARS-CoV-2 by FDA under an Emergency Use Authorization (EUA). This EUA will remain in effect (meaning this test can be used) for the duration of the COVID-19 declaration under Section 564(b)(1) of the Act, 21 U.S.C. section 360bbb-3(b)(1), unless the authorization is terminated or revoked.  Performed at [facility], [place] [address] [place], [place] [address]   SARS CORONAVIRUS 2 (TAT 6-24 HRS) Nasopharyngeal     Status: None   Collection Time: 10/14/20  7:50 PM   Specimen: Nasopharyngeal   Result Value Ref Range Status   SARS Coronavirus 2 NEGATIVE NEGATIVE Final    Comment: (NOTE) SARS-CoV-2 target nucleic acids are NOT DETECTED.  The SARS-CoV-2 RNA is generally detectable in upper and lower respiratory specimens during the acute phase of infection. Negative results do not preclude SARS-CoV-2 infection, do not rule out co-infections with other pathogens, and should not be used as the sole basis for treatment or other patient management decisions. Negative results must be combined with clinical observations, patient history, and epidemiological information. The expected result is Negative.  Fact Sheet for Patients: SugarRoll.be  Fact Sheet for Healthcare Providers: https://www.woods-mathews.com/  This test is not yet approved or cleared by the [place] FDA and  has been authorized for detection and/or diagnosis of SARS-CoV-2 by FDA under an Emergency Use Authorization (EUA). This EUA will remain  in effect (meaning this test can be used) for the duration of the COVID-19 declaration under Se ction 564(b)(1) of the Act, 21 U.S.C. section 360bbb-3(b)(1), unless the authorization is terminated or revoked sooner.  Performed at [facility], [place] [address], [place], [place] [address]      Time coordinating discharge: 35 minutes  SIGNED:   Rodena Goldmann, DO [facility] 10/15/2020, 12:09 PM  If 7PM-7AM, please contact night-coverage [email or web address]

## 2020-10-15 NOTE — Progress Notes (Signed)
Renal artery duplex completed. Refer to "CV Proc" under chart review to view preliminary results.  10/15/2020 10:24 AM Kelby Aline., MHA, RVT, RDCS, RDMS

## 2020-10-15 NOTE — ED Notes (Signed)
Pt transferred into hospital bed.

## 2020-10-15 NOTE — ED Notes (Signed)
Pt ambulated to bathroom without difficulty.

## 2020-10-16 ENCOUNTER — Other Ambulatory Visit: Payer: Self-pay

## 2020-10-16 ENCOUNTER — Encounter (HOSPITAL_COMMUNITY): Payer: Self-pay | Admitting: Internal Medicine

## 2020-10-16 DIAGNOSIS — R0789 Other chest pain: Secondary | ICD-10-CM

## 2020-10-16 NOTE — Progress Notes (Signed)
Patient with noted blood pressure improvement today no further nausea, headache, or chest pain.  She is stable for discharge to home and will continue on medications as prescribed.  Please refer to discharge summary dictated 10/15/2020 for full details.  Total care time: 10 minutes.

## 2020-10-19 LAB — ALDOSTERONE + RENIN ACTIVITY W/ RATIO
ALDO / PRA Ratio: >45.5 — ABNORMAL HIGH (ref 0.0–30.0)
Aldosterone: 7.6 ng/dL (ref 0.0–30.0)
PRA LC/MS/MS: <0.167 ng/mL/hr — ABNORMAL LOW (ref 0.167–5.380)

## 2020-10-27 ENCOUNTER — Telehealth: Payer: Self-pay | Admitting: Hematology and Oncology

## 2020-10-27 NOTE — Telephone Encounter (Signed)
Left message to reschedule follow-up appointment per 1/18 schedule message. Gave option to call back to schedule.

## 2020-11-03 NOTE — Telephone Encounter (Signed)
This patient needs to contact the hospital for records while in the hospital , call [phone] and they can direct to the HIM dept . 11/03/20  fsw

## 2020-11-05 ENCOUNTER — Ambulatory Visit: Payer: 59 | Admitting: Medical

## 2020-11-07 NOTE — Progress Notes (Signed)
Patient Care Team: Patient, No Pcp Per as PCP - General (General Practice) Hilty, Lisette Abu, MD as PCP - Cardiology (Cardiology) Sater, Pearletha Furl, MD as Consulting Physician (Neurology) Griselda Miner, MD as Consulting Physician (General Surgery) Serena Croissant, MD as Consulting Physician (Hematology and Oncology) Dorothy Puffer, MD as Consulting Physician (Radiation Oncology)  DIAGNOSIS:    ICD-10-CM   1. Malignant neoplasm of lower-outer quadrant of right breast of female, estrogen receptor positive (HCC)  C50.511    Z17.0     SUMMARY OF ONCOLOGIC HISTORY: Oncology History  Malignant neoplasm of lower-outer quadrant of right breast of female, estrogen receptor positive (HCC)  03/26/2017 Initial Diagnosis   Palpable right breast mass with a normal mammogram: 8:00 position by ultrasound 3.2 cm irregular mass: Grade 2 invasive lobular cancer ER 85%, PR 100%, Ki-67 2%, HER-2 negative ratio 1.5, T2 N0 stage II a clinical stage   04/20/2017 Genetic Testing   Genetic counseling and testing for hereditary cancer syndromes performed on . Results are negative for pathogenic mutations in 46 genes analyzed by [facility] Common Hereditary Cancers Panel. Results are dated . Genes tested: APC, ATM, AXIN2, BARD1, BMPR1A, BRCA1, BRCA2, BRIP1, CDH1, CDKN2A, CHEK2, CTNNA1, DICER1, EPCAM, GREM1, HOXB13, KIT, MEN1, MLH1, MSH2, MSH3, MSH6, MUTYH, NBN, NF1, NTHL1, PALB2, PDGFRA, PMS2, POLD1, POLE, PTEN, RAD50, RAD51C, RAD51D, SDHA, SDHB, SDHC, SDHD, SMAD4, SMARCA4, STK11, TP53, TSC1, TSC2, and VHL.  Variants of uncertain significance (not clinically actionable) were noted in APC and BRIP1.      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 with above-mentioned history of  right breast cancer treated with lumpectomy,radiation,and whois currently on anastrozole. Mammogram on 05/18/20 showed a 1.1cm group of calcifications in the right breast.  Biopsy on 06/21/2020: Benign.  She presents to the clinic todayfor follow-up. She is tolerating anastrozole extremely well without any problems or concerns.  She denies any lumps or nodules in the breast.  She has discomfort underneath the breast.  Occasional hot flashes.  ALLERGIES:  is allergic to pork-derived products.  MEDICATIONS:  Current Outpatient Medications  Medication Sig Dispense Refill   acetaminophen (TYLENOL) 325 MG tablet Take 325-650 mg by mouth every 8 (eight) hours as needed for moderate pain.     amLODipine (NORVASC) 10 MG tablet TAKE 1 TABLET(10 MG) BY MOUTH DAILY (Patient taking differently: Take 10 mg by mouth daily.) 90 tablet 2   anastrozole (ARIMIDEX) 1 MG tablet Take 1 tablet (1 mg total) by mouth daily. 90 tablet 3   aspirin 81 MG chewable tablet Chew 1 tablet (81 mg total) by mouth daily.     atorvastatin (LIPITOR) 80 MG tablet TAKE 1 TABLET(80 MG) BY MOUTH DAILY (Patient taking differently: Take 80 mg by mouth daily.) 90 tablet 1   baclofen (LIORESAL) 10 MG tablet TAKE BY MOUTH UP TO 4 TABLETS EVERY DAY FOR MUSCLE SPASMS (Patient taking differently: Take 10 mg by mouth 4 (  four) times daily as needed for muscle spasms.) 120 tablet 5   etodolac (LODINE) 400 MG tablet Take 1 tablet (400 mg total) by mouth 2 (two) times daily. 60 tablet 11   ferrous sulfate 325 (65 FE) MG tablet Take 325 mg by mouth daily.     fluticasone (FLONASE) 50 MCG/ACT nasal spray Place 1-2 sprays into both nostrils daily as needed for allergies or rhinitis.      furosemide (LASIX) 20 MG tablet Take 1 tablet (20 mg total) by mouth daily as needed (fluid retention.). (Patient taking differently: Take 20 mg by mouth daily as needed for fluid (fluid retention.).) 90 tablet 0   gabapentin (NEURONTIN) 300 MG capsule Take $RemoveBefo'900mg'[id number]$  (3 capsules) by mouth three times daily (Patient taking differently: Take 900 mg by mouth 3 (three) times daily.) 270 capsule 5   Isopropyl Alcohol (SWIMMERS EAR DROPS OT) Place 2 drops into both ears as needed (water in ears).     lisinopril (ZESTRIL) 40 MG tablet Take 1 tablet (40 mg total) by mouth daily. 30 tablet 0   methocarbamol (ROBAXIN) 500 MG tablet TAKE 1 TABLET(500 MG) BY MOUTH THREE TIMES DAILY (Patient taking differently: Take 500 mg by mouth 3 (three) times daily.) 90 tablet 0   metoprolol tartrate (LOPRESSOR) 50 MG tablet Take 1 tablet (50 mg total) by mouth 2 (two) times daily. 60 tablet 2   nitroGLYCERIN (NITROSTAT) 0.4 MG SL tablet Place 1 tablet (0.4 mg total) under the tongue every 5 (five) minutes as needed for chest pain. 25 tablet 4   OXcarbazepine (TRILEPTAL) 300 MG tablet Take one pill qAM and two pills po qHS (Patient taking differently: Take 300-600 mg by mouth See admin instructions. Take one pill in the morning and two pills at bedtime) 90 tablet 11   oxybutynin (DITROPAN XL) 10 MG 24 hr tablet Take 1 tablet (10 mg total) by mouth at bedtime. 30 tablet 5   spironolactone (ALDACTONE) 25 MG tablet Take 0.5 tablets (12.5 mg total) by mouth daily. 15 tablet 2   traMADol (ULTRAM) 50 MG tablet TAKE 1 TABLET(50 MG) BY MOUTH TWICE DAILY AS NEEDED (Patient taking differently: Take 50 mg by mouth every 12 (twelve) hours as needed for moderate pain.) 60 tablet 5   venlafaxine XR (EFFEXOR-XR) 75 MG 24 hr capsule TAKE 1 CAPSULE(75 MG) BY MOUTH DAILY WITH BREAKFAST (Patient taking differently: Take 75 mg by mouth daily.) 90 capsule 0   No current facility-administered medications for this visit.    PHYSICAL  EXAMINATION: ECOG PERFORMANCE STATUS: 1 - Symptomatic but completely ambulatory  Vitals:   11/08/20 1542  BP: (!) 170/90  Pulse: 62  Resp: 18  Temp: 97.7 F (36.5 C)  SpO2: 100%   Filed Weights   11/08/20 1542  Weight: 154 lb 6.4 oz (70 kg)    BREAST: No palpable masses or nodules in either right or left breasts. No palpable axillary supraclavicular or infraclavicular adenopathy no breast tenderness or nipple discharge. (exam performed in the presence of a chaperone)  LABORATORY DATA:  I have reviewed the data as listed CMP Latest Ref Rng & Units 10/15/2020 10/14/2020 10/14/2020  Glucose 70 - 99 mg/dL 140(H) 77 108(H)  BUN 6 - 20 mg/dL 5(L) 5(L) 9  Creatinine 0.44 - 1.00 mg/dL 0.90 0.85 0.97  Sodium 135 - 145 mmol/L 134(L) 135 126(L)  Potassium 3.5 - 5.1 mmol/L 4.0 3.0(L) 3.6  Chloride 98 - 111 mmol/L 103 102 91(L)  CO2 22 - 32 mmol/L 23 25  25  Calcium 8.9 - 10.3 mg/dL 8.8(L) 7.8(L) 9.2  Total Protein 6.5 - 8.1 g/dL 5.7(L) 5.2(L) -  Total Bilirubin 0.3 - 1.2 mg/dL 0.4 0.3 -  Alkaline Phos 38 - 126 U/L 74 72 -  AST 15 - 41 U/L 32 24 -  ALT 0 - 44 U/L 29 24 -    Lab Results  Component Value Date   WBC 7.8 10/15/2020   HGB 12.1 10/15/2020   HCT 35.1 (L) 10/15/2020   MCV 92.1 10/15/2020   PLT 283 10/15/2020   NEUTROABS 4.9 10/15/2020    ASSESSMENT & PLAN:  Malignant neoplasm of lower-outer quadrant of right breast of female, estrogen receptor positive ([place]) 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  Bilateral breast tenderness:ultrasound in both breasts.05/06/2019: Benign Breast cancer surveillance: 1.Breast exam  11/08/2020: Benign 2.Mammogram: 05/18/2020: [facility]: Benign, breast density category B calcifications 1.1 cm: Biopsy 06/21/2020: Benign  Return to clinicin 1 year for follow-up    No orders of the defined types were placed in this encounter.  The patient has a good understanding of the overall plan. she agrees with it. she will call with any problems that may develop before the next visit here.  Total time spent: 20 mins including face to face time and time spent for planning, charting and coordination of care  Nicholas Lose, MD 11/08/2020  I, Cloyde Reams Dorshimer, am acting as scribe for Dr. Nicholas Lose.  I have reviewed the above documentation for accuracy and completeness, and I agree with the above.

## 2020-11-08 ENCOUNTER — Inpatient Hospital Stay: Payer: 59 | Attending: Hematology and Oncology | Admitting: Hematology and Oncology

## 2020-11-08 ENCOUNTER — Other Ambulatory Visit: Payer: Self-pay

## 2020-11-08 DIAGNOSIS — Z79811 Long term (current) use of aromatase inhibitors: Secondary | ICD-10-CM | POA: Insufficient documentation

## 2020-11-08 DIAGNOSIS — C50511 Malignant neoplasm of lower-outer quadrant of right female breast: Secondary | ICD-10-CM | POA: Diagnosis not present

## 2020-11-08 DIAGNOSIS — Z170 Estrogen receptor positive status [ER+]: Secondary | ICD-10-CM | POA: Diagnosis not present

## 2020-11-08 DIAGNOSIS — N951 Menopausal and female climacteric states: Secondary | ICD-10-CM | POA: Insufficient documentation

## 2020-11-08 MED ORDER — ANASTROZOLE 1 MG PO TABS: 1.0000 mg | ORAL_TABLET | Freq: Every day | ORAL | 3 refills | Status: DC

## 2020-11-08 NOTE — Assessment & Plan Note (Signed)
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 These include but not limited to insomnia, hot flashes, mood changes, vaginal dryness, bone density loss, and weight gain. We strongly believe that the benefits far outweigh the risks. Patient understands these risks and consented to starting treatment. Planned treatment duration is 5 years.  Bilateral breast tenderness:ultrasound in both breasts.05/06/2019: Benign Breast cancer surveillance: 1.Breast exam  11/08/2020: Benign 2.Mammogram: 05/18/2020: [facility]: Benign, breast density category B  Return to clinicin 1 year for follow-up

## 2020-11-11 ENCOUNTER — Other Ambulatory Visit: Payer: Self-pay

## 2020-11-11 ENCOUNTER — Encounter: Payer: Self-pay | Admitting: Internal Medicine

## 2020-11-11 ENCOUNTER — Ambulatory Visit: Payer: 59 | Admitting: Internal Medicine

## 2020-11-11 VITALS — BP 140/76 | HR 59 | Ht 61.0 in | Wt 152.8 lb

## 2020-11-11 DIAGNOSIS — R079 Chest pain, unspecified: Secondary | ICD-10-CM

## 2020-11-11 DIAGNOSIS — Z79899 Other long term (current) drug therapy: Secondary | ICD-10-CM

## 2020-11-11 DIAGNOSIS — I701 Atherosclerosis of renal artery: Secondary | ICD-10-CM | POA: Diagnosis not present

## 2020-11-11 DIAGNOSIS — I2510 Atherosclerotic heart disease of native coronary artery without angina pectoris: Secondary | ICD-10-CM | POA: Diagnosis not present

## 2020-11-11 DIAGNOSIS — I10 Essential (primary) hypertension: Secondary | ICD-10-CM

## 2020-11-11 MED ORDER — SPIRONOLACTONE 25 MG PO TABS: 25.0000 mg | ORAL_TABLET | Freq: Every day | ORAL | 3 refills | Status: DC

## 2020-11-11 MED ORDER — SPIRONOLACTONE 50 MG PO TABS: 50.0000 mg | ORAL_TABLET | Freq: Every day | ORAL | 3 refills | Status: DC

## 2020-11-11 NOTE — Patient Instructions (Addendum)
Medication Instructions:  INCREASE spironolactone to 25mg  daily CONTINUE current medications  *If you need a refill on your cardiac medications before your next appointment, please call your pharmacy*   Lab Work: BMET in 1-2 weeks after med increase  If you have labs (blood work) drawn today and your tests are completely normal, you will receive your results only by: Marland Kitchen MyChart Message (if you have MyChart) OR . A paper copy in the mail If you have any lab test that is abnormal or we need to change your treatment, we will call you to review the results.   Testing/Procedures: Dr. Debara Pickett has ordered a Lex[facility] Myocardial Perfusion Imaging Study.  Please arrive 15 minutes prior to your appointment time for registration and insurance purposes.   The test will take approximately 3 to 4 hours to complete; you may bring reading material.  If someone comes with you to your appointment, they will need to remain in the main lobby due to limited space in the testing area. **If you are pregnant or breastfeeding, please notify the nuclear lab prior to your appointment**   How to prepare for your Myocardial Perfusion Test:  Do not eat or drink 3 hours prior to your test, except you may have water.  Do not consume products containing caffeine (regular or decaffeinated) 12 hours prior to your test. (ex: coffee, chocolate, sodas, tea).  Do wear comfortable clothes (no dresses or overalls) and walking shoes, tennis shoes preferred (No heels or open toe shoes are allowed).  Do NOT wear cologne, perfume, aftershave, or lotions (deodorant is allowed).  If you use an inhaler, use it the AM of your test and bring it with you.   If you use a nebulizer, use it the AM of your test.   If these instructions are not followed, your test will have to be rescheduled.    Follow-Up: At [facility], you and your health needs are our priority.  As part of our continuing mission to provide you with exceptional  heart care, we have created designated Provider Care Teams.  These Care Teams include your primary Cardiologist (physician) and Advanced Practice Providers (APPs -  Physician Assistants and Nurse Practitioners) who all work together to provide you with the care you need, when you need it.  We recommend signing up for the patient portal called "MyChart".  Sign up information is provided on this After Visit Summary.  MyChart is used to connect with patients for Virtual Visits (Telemedicine).  Patients are able to view lab/test results, encounter notes, upcoming appointments, etc.  Non-urgent messages can be sent to your provider as well.   To learn more about what you can do with MyChart, go to [email or web address].    Your next appointment:   2-3 month(s)  The format for your next appointment:   In Person  Provider:   Dr. Lyman Bishop or APP on his care team - Roby Lofts PA - [place] - [place] [facility] [place]   Other Instructions

## 2020-11-11 NOTE — Progress Notes (Signed)
11/11/2020   PCP: Patient, No Pcp Per  CC: Follow-up hospitalization  HPI:  50 year old Female hx of hypertension, dyslipidemia, chronic tobacco use, chronic kidney disease stage III admitted with hypertensive urgency 09/13/14 and 6/10 chest pain EKG showed lateral T-wave inversions on admission and cardiology was consulted. It was felt she had unstable angina and she was placed on nitro drip.  Cardiac catheterization with 2.25X 12 mm Promus remainder DES to the M RCA on 09/14/14 for 90% stenosis. EF noted 65- 70%.  She did well and discharged without complication.    She was on numerous BP meds and her BP was running in the 90s to 106 and she felt very weak, lethargic.  The hydralazine was stopped and the cardizem.  She is feeling better.  She has occasional brief chest pressure feeling like the discomfort she was admitted with.  She is here today for follow-up. She is actually doing quite well her questions we discussed where the pneumonia vaccine she is only 42 and essentially in good health told her that was not needed unless she wanted it she does not want a flu vaccine she's had side effects in the past and refuses to take it. She is scheduled for sleep study January 29 2 [place]. She was recently seen by her primary care for cold symptoms and was placed on Zithromax.  She is feeling better.    She is asking about exercise which I recommended she could go back to to start slowly but work only a peripheral 1-2 weeks. She did not have a heart attack and her EF is normal.  Her mother was with her today who also has stents but the mother is or bare-metal stents. We discussed the difference between the 2 and the need for Plavix with both, and the length of time she would need to take the Plavix.  She is no longer smoking. She is using the NicoDerm.  I saw Anna Henson back today in the office for follow-up. She denies any chest pain or worsening shortness of breath. She seems to be  stable from a cardiac standpoint after her stent. She does however have an acute complaint. She tells me that about 2 weeks ago she had a viral upper respiratory infection and had a lot of discharge and pus. She has sore throat associated with this. Subsequently she felt a pop on the right side of her head. After that she had pain and numbness on the right side of her face on her right shoulder and down to the right upper arm and right upper back. She's also had difficulty swallowing due to pain particularly in the right side of her throat as well as some mild drooling. She denies any facial droop, right sided weakness, loss of consciousness, change in vision or other associated symptoms. She denies any headache. Blood pressure has been well controlled and is at goal today at 122/76. She was seen in urgent care and treated for acute pansinusitis by Dr. Elder Cyphers and given amoxicillin which she completed. Lab showed no elevated white count, normal renal function, her cholesterol profile was well controlled, and she had a low ESR 15  (which would likely exclude temporal arteritis).  02/23/2017  Anna Henson returns today for follow-up. Unfortunately she suffered a non-ST elevation MI in March 2018. She was found to have total occlusion of the RCA treated with drug-eluting stents. There was some mild nonobstructive disease of the LAD and circumflex and some mild  LV systolic dysfunction with EF 50-55%. She reports doing much better since discharge. She denies any recurrent chest pain but does get short of breath and some fatigue with exertion. She started cardiac rehabilitation but found that it worsened her chronic back pain and now she's discontinued that. She recently saw Ignacia Bayley, NP, in follow-up. She was felt to be doing well although reported having some tachycardia palpitations when awakening from sleep in the middle the night. He was concerned about arrhythmias and placed a monitor. Her monitor showed normal  sinus rhythm without any arrhythmias. She since discontinued that. She is on Brilinta but does not report that her shortness of breath seems to be related to the doses. I advised that she can consider some caffeine after the medication to see if it attenuates some of the symptoms.  05/16/2017  Anna Henson returns today for follow-up. Recently she's noted that her blood pressures been running much higher although was well controlled in the hospital. She denies any chest pain although has had some mild shortness of breath and occasional sharp chest discomfort. Blood pressure is notably been elevated with systolics in the 701X and diastolics over 793J. Yesterday she was advised to take an additional lisinopril which brought her systolic blood pressure down to the 150s. Typically her blood pressures are elevated more in the evening per her report and in the morning. She was present on a diuretic was taken off of that however due to low blood pressure. Unfortunate, she was recently diagnosed with breast cancer and will need a surgery most likely in September. This will be delayed as she is on dual antiplatelet therapy given her recent stent in March 2018.   09/07/2017  Anna Henson of returns today for follow-up.  She underwent surgery for breast cancer in September.  She was off of her Effient for that procedure and then restarted it.  Since then she has been undergoing radiation therapy.  She seems to be doing fairly well with this.  She denies any recurrent chest pain or worsening shortness of breath.  Lipid profile in September 2018 showed total cholesterol 120, triglycerides 70, HDL 37 and LDL 69.  12/21/2017  Anna Henson returns today for follow-up of her coronary disease.  She underwent PCI in March 2018.  Is now been a year since then.  She is on dual antiplatelet therapy with aspirin and Effient.  She reports ongoing chest wall pain which is worse with breathing.  The pain is described as sharp and occurs on a  daily basis.  Is not necessarily worsened.  This is different pain than she had with her most recent PCI.  There is concern this may be related to chest wall radiation.  09/09/2020  Mrs. Cumbee returns today for follow-up.  Recently she was seen by Almyra Deforest, PA-C who had made some adjustments to her medications.  She had surgery and he had switched her to metoprolol, stopping her hydralazine and her amlodipine.  She seems to be tolerating this well however blood pressure is not optimally controlled.  She is notices more recently been around 140/70.  Currently she is on amlodipine 10 mg, lisinopril 20 mg twice daily and metoprolol.  She has not been on any diuretic.  11/11/2020  Mrs. Goodlin returns today for follow-up.  Fortunately she was just hospitalized.  She was noted to have chest pain and elevated blood pressure over 030 systolic.  Her medications were adjusted and she was started on low-dose Aldactone.  An echo  was performed which showed normal systolic function and grade 2 diastolic dysfunction.  Subsequently she reports her chest pain is improved and her blood pressure is now more reasonably controlled today at 140/76.  Renal artery Dopplers were performed which demonstrated no evidence of right renal artery stenosis but greater than 60% left renal artery stenosis.  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  Current Outpatient Medications  Medication Sig Dispense Refill  . acetaminophen (TYLENOL) 325 MG tablet Take 325-650 mg by mouth every 8 (eight) hours as needed for moderate pain.    Marland Kitchen amLODipine (NORVASC) 10 MG tablet TAKE 1 TABLET(10 MG) BY MOUTH DAILY (Patient taking differently: Take 10 mg by mouth daily.) 90 tablet 2  . anastrozole (ARIMIDEX) 1 MG tablet Take 1 tablet (1 mg total) by mouth daily. 90 tablet 3  . aspirin 81 MG chewable tablet Chew 1 tablet (81 mg total) by mouth daily.    Marland Kitchen atorvastatin (LIPITOR) 80 MG tablet TAKE 1 TABLET(80 MG) BY MOUTH DAILY (Patient taking  differently: Take 80 mg by mouth daily.) 90 tablet 1  . baclofen (LIORESAL) 10 MG tablet TAKE BY MOUTH UP TO 4 TABLETS EVERY DAY FOR MUSCLE SPASMS (Patient taking differently: Take 10 mg by mouth 4 (four) times daily as needed for muscle spasms.) 120 tablet 5  . etodolac (LODINE) 400 MG tablet Take 1 tablet (400 mg total) by mouth 2 (two) times daily. 60 tablet 11  . ferrous sulfate 325 (65 FE) MG tablet Take 325 mg by mouth daily.    . fluticasone (FLONASE) 50 MCG/ACT nasal spray Place 1-2 sprays into both nostrils daily as needed for allergies or rhinitis.    . furosemide (LASIX) 20 MG tablet Take 1 tablet (20 mg total) by mouth daily as needed (fluid retention.). (Patient taking differently: Take 20 mg by mouth daily as needed for fluid (fluid retention.).) 90 tablet 0  . gabapentin (NEURONTIN) 300 MG capsule Take 927m (3 capsules) by mouth three times daily (Patient taking differently: Take 900 mg by mouth 3 (three) times daily.) 270 capsule 5  . Isopropyl Alcohol (SWIMMERS EAR DROPS OT) Place 2 drops into both ears as needed (water in ears).    .Marland Kitchenlisinopril (ZESTRIL) 40 MG tablet Take 1 tablet (40 mg total) by mouth daily. 30 tablet 0  . methocarbamol (ROBAXIN) 500 MG tablet TAKE 1 TABLET(500 MG) BY MOUTH THREE TIMES DAILY (Patient taking differently: Take 500 mg by mouth 3 (three) times daily.) 90 tablet 0  . metoprolol tartrate (LOPRESSOR) 50 MG tablet Take 1 tablet (50 mg total) by mouth 2 (two) times daily. 60 tablet 2  . nitroGLYCERIN (NITROSTAT) 0.4 MG SL tablet Place 1 tablet (0.4 mg total) under the tongue every 5 (five) minutes as needed for chest pain. 25 tablet 4  . OXcarbazepine (TRILEPTAL) 300 MG tablet Take one pill qAM and two pills po qHS (Patient taking differently: Take 300-600 mg by mouth See admin instructions. Take one pill in the morning and two pills at bedtime) 90 tablet 11  . oxybutynin (DITROPAN XL) 10 MG 24 hr tablet Take 1 tablet (10 mg total) by mouth at bedtime. 30  tablet 5  . spironolactone (ALDACTONE) 25 MG tablet Take 0.5 tablets (12.5 mg total) by mouth daily. 15 tablet 2  . traMADol (ULTRAM) 50 MG tablet TAKE 1 TABLET(50 MG) BY MOUTH TWICE DAILY AS NEEDED (Patient taking differently: Take 50 mg by mouth every 12 (twelve) hours as needed for moderate pain.) 60 tablet  5  . venlafaxine XR (EFFEXOR-XR) 75 MG 24 hr capsule TAKE 1 CAPSULE(75 MG) BY MOUTH DAILY WITH BREAKFAST (Patient taking differently: Take 75 mg by mouth daily.) 90 capsule 0   No current facility-administered medications for this visit.    Past Medical History:  Diagnosis Date  . Anemia   . Anxiety   . Bipolar disorder in full remission ([place])   . CAD in native artery cardiologist--  dr Kalandra Masters   a. 09/2014 Cath/PCI in setting of [place] - s/p 2.25 x 12 mm Promus Premier DES to mid RCA;  b. 11/2016 Myoview: EF 56%, no ischemia;  c. 12/2016 NSTEMI/PCI: LM nl, LAd 25p, LCX 30p, RCA 100p (2.75x38 Promus Premier DES), mRCA 10 ISR, EF 50-55%.  . CKD (chronic kidney disease), stage III ([place])   . Depression   . Genetic testing 04/23/2017   Ms. Qualls underwent genetic counseling and testing for hereditary cancer syndromes on 04/05/2017. Her results were negative for mutations in all 46 genes analyzed by [facility]'s 46-gene Common Hereditary Cancers Panel. Genes analyzed include: APC, ATM, AXIN2, BARD1, BMPR1A, BRCA1, BRCA2, BRIP1, CDH1, CDKN2A, CHEK2, CTNNA1, DICER1, EPCAM, GREM1, HOXB13, KIT, MEN1, MLH1, MSH2, MSH3, MSH6, MUTYH, NBN,  . GERD (gastroesophageal reflux disease)   . Headache   . History of external beam radiation therapy    right breast 07-27-2017  to 09-25-2017  . History of non-ST elevation myocardial infarction (NSTEMI)   . HOCM (hypertrophic obstructive cardiomyopathy) ([place]) followed by cardiology   a. 12/2016 Echo: EF 65-70%,  mod conc LVH, dynamic obstruction @ rest, peak velocity of 291 cm/sec w/ peak gradient of 68mHg, no rwma, Gr1 DD, triv TR, PASP 114mg.  . Marland Kitchenyperlipidemia   .  Hypertension   . Malignant neoplasm of lower-outer quadrant of right breast of female, estrogen [facility] dr gu[facility]indi Adie dx 06/ 2018--- right breast invasive lobular carcinoma, ductal carcinoma w/ LCIS---- 06-21-2017 s/p right breast lumpecotmy w/ sln disseciton;   completed radiation 09-25-2017;  on tamoxifen  . [place]ocardial infarction ([facility]   2017  . S/P drug eluting coronary stent placement    09-14-2014---  [facility]  PCI with DES x1 to proxRCA  . [facility] dr sa[facility]lecia Shelling 01/ 2017  dx transverse myelitis w/ right side numbness (09-01-2019  currently lower extremity weakness, mucsle spasms, and gait disturbance)  . Wears glasses     Past Surgical History:  Procedure Laterality Date  . BREAST LUMPECTOMY WITH RADIOACTIVE SEED AND SENTINEL LYMPH NODE BIOPSY Right 06/21/2017   Procedure: RIGHT BREAST BRACKETED SEED GUIDED LUMPECTOMY AND SENTINEL LYMPH NODE BIOPSY;  Surgeon: ToJovita KussmaulMD;  [place] [facility] Service: General;  Laterality: Right;  . CORONARY/GRAFT ACUTE MI REVASCULARIZATION N/A 12/31/2016   Procedure: Coronary/Graft Acute MI Revascularization;  Surgeon: MiSherren MochaMD;  [facility];  Service: Cardiovascular;  Laterality: N/A;  . DILATATION & CURETTAGE/HYSTEROSCOPY WITH MYOSURE N/A 09/02/2019   Procedure: DILATATION & CURETTAGE/HYSTEROSCOPY WITH MYOSURE;  Surgeon: FoAloha GellMD;  [facility] Service: Gynecology;  Laterality: N/A;  . HERNIA REPAIR    . LEFT HEART CATH AND CORONARY ANGIOGRAPHY N/A 12/31/2016   Procedure: Left Heart Cath and Coronary Angiography;  Surgeon: MiSherren MochaMD;  [place] [facility];  Service: Cardiovascular;  Laterality: N/A;  . LEFT HEART CATHETERIZATION WITH CORONARY ANGIOGRAM N/A 09/14/2014   Procedure: LEFT HEART CATHETERIZATION WITH CORONARY ANGIOGRAM;  Surgeon: Burnell Blanks, MD;  Location[facility]  CATH LAB;  Service: Cardiovascular;  Laterality: N/A;  . MASS EXCISION N/A 03/30/2020   Procedure: EXCISION MASS RIGHT LABIA MINORA;  Surgeon: Lafonda Mosses, MD;  Location: [facility];  Service: Gynecology;  Laterality: N/A;  . PERCUTANEOUS CORONARY STENT INTERVENTION (PCI-S)  09/14/2014   Procedure: PERCUTANEOUS CORONARY STENT INTERVENTION (PCI-S);  Surgeon: Burnell Blanks, MD;  Location: [facility] CATH LAB;  Service: Cardiovascular;;  . ROBOTIC ASSISTED SALPINGO OOPHERECTOMY Bilateral 03/30/2020   Procedure: XI ROBOTIC ASSISTED SALPINGO OOPHORECTOMY;  Surgeon: Lafonda Mosses, MD;  Location: [facility];  Service: Gynecology;  Laterality: Bilateral;  . TONSILLECTOMY  2005  . UMBILICAL HERNIA REPAIR  2001; 2005    ROS: Pertinent items noted in HPI and remainder of comprehensive ROS otherwise negative.  Wt Readings from Last 3 Encounters:  11/11/20 152 lb 12.8 oz (69.3 kg)  11/08/20 154 lb 6.4 oz (70 kg)  10/14/20 150 lb (68 kg)    PHYSICAL EXAM BP 140/76   Pulse (!) 59   Ht '5\' 1"'  (1.549 m)   Wt 152 lb 12.8 oz (69.3 kg)   LMP 03/06/2020   SpO2 100%   BMI 28.87 kg/m  General appearance: alert and no distress Neck: no carotid bruit and no JVD Lungs: clear to auscultation bilaterally Heart: regular rate and rhythm, S1, S2 normal, no murmur, click, rub or gallop Abdomen: soft, non-tender; bowel sounds normal; no masses,  no organomegaly Extremities: extremities normal, atraumatic, no cyanosis or edema Pulses: 2+ and symmetric Skin: Skin color, texture, turgor normal. No rashes or lesions Neurologic: Grossly normal Psych: pleasant  EKG:  Normal sinus rhythm at 67, nonspecific ST changes-personally reviewed  ASSESSMENT: 1. Chest pain 2. History of NSTEMI - s/p repeat PCI to the RCA 12/2016 - DES 3. Coronary artery disease status post PCI to the RCA in 2015 4. Dyslipidemia-at goal 5. Hypertension 6. DOE 7. Breast cancer   PLAN: Ms Suminski was recently admitted with  hypertension that was uncontrolled and chest pain.  The chest pain is resolved and she has had no further chest pain.  An echo shows grade 2 diastolic dysfunction.  She had renal Dopplers which indicated greater than 60% left renal artery stenosis and 70 to 99% stenosis in the celiac artery.  The consequence of this is unclear.  Typically bilateral renal artery stenosis of significance would be treated.  I will discuss the findings with Dr. Gwenlyn Found to see if he has any suggestions.  In the meantime increase her Aldactone from 12.5 to 25 mg daily.  We will check a metabolic profile to look at her renal function and potassium. Finally, given chest pain and her history of coronary artery disease with prior stent in 2018, will arrange for [facility]  Plan follow-up with me in a couple months.Pixie Casino, MD, [place], [place] Director of the [facility] Attending Cardiologist  Direct Dial: [phone]  Fax: [phone]  Website:  [email or web address]

## 2020-11-15 ENCOUNTER — Other Ambulatory Visit: Payer: Self-pay | Admitting: Internal Medicine

## 2020-11-15 DIAGNOSIS — I774 Celiac artery compression syndrome: Secondary | ICD-10-CM

## 2020-11-15 DIAGNOSIS — I10 Essential (primary) hypertension: Secondary | ICD-10-CM

## 2020-11-15 DIAGNOSIS — I771 Stricture of artery: Secondary | ICD-10-CM

## 2020-11-15 DIAGNOSIS — B977 Papillomavirus as the cause of diseases classified elsewhere: Secondary | ICD-10-CM | POA: Insufficient documentation

## 2020-11-15 DIAGNOSIS — N915 Oligomenorrhea, unspecified: Secondary | ICD-10-CM | POA: Insufficient documentation

## 2020-11-15 DIAGNOSIS — I701 Atherosclerosis of renal artery: Secondary | ICD-10-CM

## 2020-11-15 DIAGNOSIS — I252 Old myocardial infarction: Secondary | ICD-10-CM | POA: Insufficient documentation

## 2020-11-15 DIAGNOSIS — C50919 Malignant neoplasm of unspecified site of unspecified female breast: Secondary | ICD-10-CM | POA: Insufficient documentation

## 2020-11-15 DIAGNOSIS — N83209 Unspecified ovarian cyst, unspecified side: Secondary | ICD-10-CM | POA: Insufficient documentation

## 2020-11-15 DIAGNOSIS — D259 Leiomyoma of uterus, unspecified: Secondary | ICD-10-CM | POA: Insufficient documentation

## 2020-11-18 ENCOUNTER — Telehealth (HOSPITAL_COMMUNITY): Payer: Self-pay | Admitting: *Deleted

## 2020-11-18 NOTE — Telephone Encounter (Signed)
Close encounter 

## 2020-11-19 ENCOUNTER — Ambulatory Visit (HOSPITAL_COMMUNITY)
Admission: RE | Admit: 2020-11-19 | Discharge: 2020-11-19 | Disposition: A | Discharge status: Home / Self Care | Payer: 59 | Source: Ambulatory Visit | Attending: Cardiovascular Disease | Admitting: Cardiovascular Disease

## 2020-11-19 ENCOUNTER — Ambulatory Visit (HOSPITAL_COMMUNITY)
Admission: RE | Admit: 2020-11-19 | Discharge: 2020-11-19 | Disposition: A | Discharge status: Home / Self Care | Payer: 59 | Source: Ambulatory Visit | Attending: Internal Medicine | Admitting: Internal Medicine

## 2020-11-19 ENCOUNTER — Other Ambulatory Visit: Payer: Self-pay

## 2020-11-19 DIAGNOSIS — R079 Chest pain, unspecified: Secondary | ICD-10-CM | POA: Diagnosis not present

## 2020-11-19 DIAGNOSIS — I701 Atherosclerosis of renal artery: Secondary | ICD-10-CM | POA: Insufficient documentation

## 2020-11-19 DIAGNOSIS — I10 Essential (primary) hypertension: Secondary | ICD-10-CM | POA: Insufficient documentation

## 2020-11-19 DIAGNOSIS — I2510 Atherosclerotic heart disease of native coronary artery without angina pectoris: Secondary | ICD-10-CM

## 2020-11-19 LAB — MYOCARDIAL PERFUSION IMAGING
LV dias vol: 81 mL (ref 46–106)
LV sys vol: 31 mL
Peak HR: 110 {beats}/min
Rest HR: 64 {beats}/min
SDS: 3
SRS: 1
SSS: 4
TID: 0.99

## 2020-11-19 LAB — POCT I-STAT CREATININE: Creatinine, Ser: 0.8 mg/dL (ref 0.44–1.00)

## 2020-11-19 MED ORDER — AMINOPHYLLINE 25 MG/ML IV SOLN
50.0000 mg | Freq: Once | INTRAVENOUS | Status: AC
  Administered 2020-11-19: 50 mg via INTRAVENOUS

## 2020-11-19 MED ORDER — REGADENOSON 0.4 MG/5ML IV SOLN
0.4000 mg | Freq: Once | INTRAVENOUS | Status: AC
  Administered 2020-11-19: 0.4 mg via INTRAVENOUS

## 2020-11-19 MED ORDER — AMINOPHYLLINE 25 MG/ML IV SOLN
75.0000 mg | Freq: Once | INTRAVENOUS | Status: AC
  Administered 2020-11-19: 75 mg via INTRAVENOUS

## 2020-11-19 MED ORDER — TECHNETIUM TC 99M TETROFOSMIN IV KIT
10.1000 | PACK | Freq: Once | INTRAVENOUS | Status: AC | PRN
  Administered 2020-11-19: 10.1 via INTRAVENOUS
  Filled 2020-11-19: qty 11

## 2020-11-19 MED ORDER — TECHNETIUM TC 99M TETROFOSMIN IV KIT
30.9000 | PACK | Freq: Once | INTRAVENOUS | Status: AC | PRN
  Administered 2020-11-19: 30.9 via INTRAVENOUS
  Filled 2020-11-19: qty 31

## 2020-11-19 MED ORDER — IOHEXOL 350 MG/ML SOLN
100.0000 mL | Freq: Once | INTRAVENOUS | Status: AC | PRN
  Administered 2020-11-19: 100 mL via INTRAVENOUS

## 2020-11-22 ENCOUNTER — Ambulatory Visit: Payer: 59 | Admitting: Hematology and Oncology

## 2020-11-22 ENCOUNTER — Other Ambulatory Visit: Payer: Self-pay | Admitting: Hematology and Oncology

## 2020-11-29 ENCOUNTER — Ambulatory Visit: Payer: 59 | Admitting: Medical

## 2020-11-29 ENCOUNTER — Other Ambulatory Visit: Payer: Self-pay

## 2020-11-29 MED ORDER — METHOCARBAMOL 500 MG PO TABS: ORAL_TABLET | ORAL | 0 refills | Status: DC

## 2020-12-01 ENCOUNTER — Emergency Department (HOSPITAL_COMMUNITY): Payer: 59

## 2020-12-01 ENCOUNTER — Other Ambulatory Visit: Payer: Self-pay

## 2020-12-01 ENCOUNTER — Emergency Department (HOSPITAL_COMMUNITY)
Admission: EM | Admit: 2020-12-01 | Discharge: 2020-12-01 | Disposition: A | Discharge status: Home / Self Care | Payer: 59 | Attending: Emergency Medicine | Admitting: Emergency Medicine

## 2020-12-01 ENCOUNTER — Encounter (HOSPITAL_COMMUNITY): Payer: Self-pay | Admitting: Emergency Medicine

## 2020-12-01 DIAGNOSIS — N183 Chronic kidney disease, stage 3 unspecified: Secondary | ICD-10-CM | POA: Diagnosis not present

## 2020-12-01 DIAGNOSIS — Z7982 Long term (current) use of aspirin: Secondary | ICD-10-CM | POA: Insufficient documentation

## 2020-12-01 DIAGNOSIS — I2510 Atherosclerotic heart disease of native coronary artery without angina pectoris: Secondary | ICD-10-CM | POA: Diagnosis not present

## 2020-12-01 DIAGNOSIS — S199XXA Unspecified injury of neck, initial encounter: Secondary | ICD-10-CM | POA: Diagnosis present

## 2020-12-01 DIAGNOSIS — S161XXA Strain of muscle, fascia and tendon at neck level, initial encounter: Secondary | ICD-10-CM | POA: Insufficient documentation

## 2020-12-01 DIAGNOSIS — Z87891 Personal history of nicotine dependence: Secondary | ICD-10-CM | POA: Insufficient documentation

## 2020-12-01 DIAGNOSIS — R0789 Other chest pain: Secondary | ICD-10-CM | POA: Insufficient documentation

## 2020-12-01 DIAGNOSIS — W06XXXA Fall from bed, initial encounter: Secondary | ICD-10-CM | POA: Insufficient documentation

## 2020-12-01 DIAGNOSIS — Z79899 Other long term (current) drug therapy: Secondary | ICD-10-CM | POA: Diagnosis not present

## 2020-12-01 DIAGNOSIS — M79644 Pain in right finger(s): Secondary | ICD-10-CM | POA: Insufficient documentation

## 2020-12-01 DIAGNOSIS — I10 Essential (primary) hypertension: Secondary | ICD-10-CM

## 2020-12-01 DIAGNOSIS — I130 Hypertensive heart and chronic kidney disease with heart failure and stage 1 through stage 4 chronic kidney disease, or unspecified chronic kidney disease: Secondary | ICD-10-CM | POA: Insufficient documentation

## 2020-12-01 DIAGNOSIS — I509 Heart failure, unspecified: Secondary | ICD-10-CM | POA: Diagnosis not present

## 2020-12-01 LAB — CBC
HCT: 44.6 % (ref 36.0–46.0)
Hemoglobin: 15 g/dL (ref 12.0–15.0)
MCH: 31.5 pg (ref 26.0–34.0)
MCHC: 33.6 g/dL (ref 30.0–36.0)
MCV: 93.7 fL (ref 80.0–100.0)
Platelets: 328 10*3/uL (ref 150–400)
RBC: 4.76 MIL/uL (ref 3.87–5.11)
RDW: 14.1 % (ref 11.5–15.5)
WBC: 10.6 10*3/uL — ABNORMAL HIGH (ref 4.0–10.5)
nRBC: 0 % (ref 0.0–0.2)

## 2020-12-01 LAB — TROPONIN I (HIGH SENSITIVITY)
Troponin I (High Sensitivity): 13 ng/L (ref <18)
Troponin I (High Sensitivity): 11 ng/L (ref <18)

## 2020-12-01 LAB — I-STAT BETA HCG BLOOD, ED (MC, WL, AP ONLY): I-stat hCG, quantitative: <5 m[IU]/mL (ref <5)

## 2020-12-01 LAB — BASIC METABOLIC PANEL
Anion gap: 11 (ref 5–15)
BUN: 10 mg/dL (ref 6–20)
CO2: 24 mmol/L (ref 22–32)
Calcium: 9.9 mg/dL (ref 8.9–10.3)
Chloride: 104 mmol/L (ref 98–111)
Creatinine, Ser: 1.08 mg/dL — ABNORMAL HIGH (ref 0.44–1.00)
GFR, Estimated: >60 mL/min (ref >60)
Glucose, Bld: 102 mg/dL — ABNORMAL HIGH (ref 70–99)
Potassium: 4 mmol/L (ref 3.5–5.1)
Sodium: 139 mmol/L (ref 135–145)

## 2020-12-01 LAB — PROTIME-INR
INR: 0.9 (ref 0.8–1.2)
Prothrombin Time: 11.5 seconds (ref 11.4–15.2)

## 2020-12-01 MED ORDER — DIAZEPAM 5 MG PO TABS: 5.0000 mg | ORAL_TABLET | Freq: Two times a day (BID) | ORAL | 0 refills | Status: DC | PRN

## 2020-12-01 MED ORDER — LORAZEPAM 2 MG/ML IJ SOLN: 1.0000 mg | Freq: Once | INTRAMUSCULAR | Status: DC

## 2020-12-01 MED ORDER — AMLODIPINE BESYLATE 5 MG PO TABS
10.0000 mg | ORAL_TABLET | Freq: Once | ORAL | Status: AC
  Administered 2020-12-01: 10 mg via ORAL
  Filled 2020-12-01: qty 2

## 2020-12-01 MED ORDER — LISINOPRIL 20 MG PO TABS
40.0000 mg | ORAL_TABLET | Freq: Once | ORAL | Status: AC
  Administered 2020-12-01: 40 mg via ORAL
  Filled 2020-12-01: qty 2

## 2020-12-01 MED ORDER — LORAZEPAM 2 MG/ML IJ SOLN
1.0000 mg | Freq: Once | INTRAMUSCULAR | Status: AC
  Administered 2020-12-01: 1 mg via INTRAMUSCULAR
  Filled 2020-12-01: qty 1

## 2020-12-01 MED ORDER — SPIRONOLACTONE 25 MG PO TABS
25.0000 mg | ORAL_TABLET | Freq: Once | ORAL | Status: AC
  Administered 2020-12-01: 25 mg via ORAL
  Filled 2020-12-01: qty 1

## 2020-12-01 NOTE — ED Provider Notes (Signed)
[facility] EMERGENCY DEPARTMENT Provider Note   CSN: [id number] Arrival date & time: 12/01/20  1031     History Chief Complaint  Patient presents with  . Chest Pain/Hypertension     Anna Henson is a 50 y.o. female.  Pt presents to the ED today with cp and elevated bp.  Pt has a hx of cardiac disease and htn.  She did see Dr. Debara Pickett (cards) on 2/3 and a Myoview was scheduled for 2/11.  It showed no ischemia and LVEF of 61%.  Pt's bp was elevated then.  She said it's been elevated for several weeks.  She saw her pcp a few days ago and sbp was 198.  The pt said her pcp did not change any of her meds.  Pt also notes that she fell out of her bed about a week ago and has been having a lot of pain on the left side of her neck and in her right middle finger.  The pt sadi she had some cp this morning and took 2 nitro.  CP is still there.  Pt did not take any of her morning bp meds and is worried that her bp is elevated.  Pt denies any sob or cough.          Past Medical History:  Diagnosis Date  . Anemia   . Anxiety   . Bipolar disorder in full remission ([place])   . CAD in native artery cardiologist--  dr hilty   a. 09/2014 Cath/PCI in setting of [place] - s/p 2.25 x 12 mm Promus Premier DES to mid RCA;  b. 11/2016 Myoview: EF 56%, no ischemia;  c. 12/2016 NSTEMI/PCI: LM nl, LAd 25p, LCX 30p, RCA 100p (2.75x38 Promus Premier DES), mRCA 10 ISR, EF 50-55%.  . CKD (chronic kidney disease), stage III ([place])   . Depression   . Genetic testing 04/23/2017   Ms. Scerbo underwent genetic counseling and testing for hereditary cancer syndromes on 04/05/2017. Her results were negative for mutations in all 46 genes analyzed by [facility]'s 46-gene Common Hereditary Cancers Panel. Genes analyzed include: APC, ATM, AXIN2, BARD1, BMPR1A, BRCA1, BRCA2, BRIP1, CDH1, CDKN2A, CHEK2, CTNNA1, DICER1, EPCAM, GREM1, HOXB13, KIT, MEN1, MLH1, MSH2, MSH3, MSH6, MUTYH, NBN,  . GERD (gastroesophageal reflux  disease)   . Headache   . History of external beam radiation therapy    right breast 07-27-2017  to 09-25-2017  . History of non-ST elevation myocardial infarction (NSTEMI)   . HOCM (hypertrophic obstructive cardiomyopathy) ([place]) followed by cardiology   a. 12/2016 Echo: EF 65-70%,  mod conc LVH, dynamic obstruction @ rest, peak velocity of 291 cm/sec w/ peak gradient of 27mHg, no rwma, Gr1 DD, triv TR, PASP 128mg.  . Marland Kitchenyperlipidemia   . Hypertension   . Malignant neoplasm of lower-outer quadrant of right breast of female, estrogen receptor positive [facility] gu[facility]indi Adie dx 06/ 2018--- right breast invasive lobular carcinoma, ductal carcinoma w/ LCIS---- 06-21-2017 s/p right breast lumpecotmy w/ sln disseciton;   completed radiation 09-25-2017;  on tamoxifen  . [place]   [place]  . S/P drug eluting coronary stent placement    09-14-2014---  [facility]  PCI with DES x1 to proxRCA  . [facility] dr saFelecia Shelling 01/ 2017  dx transverse myelitis w/ right side numbness (09-01-2019  currently lower extremity weakness, mucsle spasms, and gait disturbance)  . Wears glasses  Patient Active Problem List   Diagnosis Date Noted  . Hyponatremia 10/14/2020  . Chronic diastolic CHF (congestive heart failure) ([place]) 10/08/2020  . Neuropathic pain 07/06/2020  . Cervical spinal stenosis 06/15/2020  . Lhermitte's sign positive 06/15/2020  . Labial cyst   . Complex cyst of right ovary 12/05/2019  . Hypertensive urgency 07/28/2018  . Chronic anemia   . MDD (major depressive disorder), recurrent episode, severe ([place]) 08/28/2017  . Neck stiffness 08/22/2017  . Abnormal finding on MRI of brain 08/22/2017  . HOCM (hypertrophic obstructive cardiomyopathy) ([place])   . Chest pain 05/06/2017  . Genetic testing 04/23/2017  . Malignant neoplasm of lower-outer quadrant of right breast of female, estrogen receptor positive ([place])  03/29/2017  . Dysesthesia 03/12/2017  . Palpitations 02/23/2017  . S/P cardiac cath (12/2016) a. acute total occlusion of the RCA tx w/PCI DES, b. mild nonobs LAD/LCx stenosis, c. mild segmental LV sys dx w/ sev hypokinesis, LVEF 50-55% 01/02/2017  . Non-ST elevation myocardial infarction (NSTEMI), subsequent episode of care ([place]) 12/31/2016  . Malignant hypertension   . Chronic kidney disease   . Abnormal EKG   . Nausea   . Ischemic cardiomyopathy   . Muscle spasms of both lower extremities 04/17/2016  . Gait disturbance 01/25/2016  . 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 Acute-on-chronic kidney injury ([place]) 11/12/2015  . Neck pain 11/12/2015  . Hypokalemia 11/12/2015  . Abnormal MRI, neck 11/12/2015  . Numbness 11/12/2015  . Hypertensive crisis   . Hyperlipidemia   . Coronary artery disease involving native coronary artery of native heart without angina pectoris   . Facial numbness   . Right arm numbness   . Throat pain 11/09/2015  . Drooling 11/09/2015  . CAD (coronary artery disease), native coronary artery 09/15/2014  . Headache 09/15/2014  . UTI (urinary tract infection): Probable 09/14/2014  . Candida infection of genital region 09/14/2014  . Unstable angina ([place]) 09/13/2014  . HLD (hyperlipidemia) 09/13/2014  . Otitis 09/13/2014  . ACS (acute coronary syndrome) ([place]) 09/13/2014  . Tobacco abuse 09/13/2014  . Essential hypertension 09/09/2014    Past Surgical History:  Procedure Laterality Date  . BREAST LUMPECTOMY WITH RADIOACTIVE SEED AND SENTINEL LYMPH NODE BIOPSY Right 06/21/2017   Procedure: RIGHT BREAST BRACKETED SEED GUIDED LUMPECTOMY AND SENTINEL LYMPH NODE BIOPSY;  Surgeon: Jovita Kussmaul, MD;  Location: [place];  Service: General;  Laterality: Right;  . CORONARY/GRAFT ACUTE MI REVASCULARIZATION N/A 12/31/2016   Procedure: Coronary/Graft Acute MI Revascularization;  Surgeon: Sherren Mocha, MD;  Location: [place] CV LAB;  Service: Cardiovascular;   Laterality: N/A;  . DILATATION & CURETTAGE/HYSTEROSCOPY WITH MYOSURE N/A 09/02/2019   Procedure: DILATATION & CURETTAGE/HYSTEROSCOPY WITH MYOSURE;  Surgeon: Aloha Gell, MD;  Location: [place];  Service: Gynecology;  Laterality: N/A;  . HERNIA REPAIR    . LEFT HEART CATH AND CORONARY ANGIOGRAPHY N/A 12/31/2016   Procedure: Left Heart Cath and Coronary Angiography;  Surgeon: Sherren Mocha, MD;  Location: [place] CV LAB;  Service: Cardiovascular;  Laterality: N/A;  . LEFT HEART CATHETERIZATION WITH CORONARY ANGIOGRAM N/A 09/14/2014   Procedure: LEFT HEART CATHETERIZATION WITH CORONARY ANGIOGRAM;  Surgeon: Burnell Blanks, MD;  [facility] C[facility] LAB;  Service: Cardiovascular;  Laterality: N/A;  . MASS EXCISION N/A 03/30/2020   Procedure: EXCISION MASS RIGHT LABIA MINORA;  Surgeon: Lafonda Mosses, MD;  Location: [facility];  Service: Gynecology;  Laterality: N/A;  . PERCUTANEOUS CORONARY STENT INTERVENTION (PCI-S)  09/14/2014   Procedure: PERCUTANEOUS CORONARY  STENT INTERVENTION (PCI-S);  Surgeon: Burnell Blanks, MD;  Location: [facility] C[facility] LAB;  Service: Cardiovascular;;  . ROBOTIC ASSISTED SALPINGO OOPHERECTOMY Bilateral 03/30/2020   Procedure: XI ROBOTIC ASSISTED SALPINGO OOPHORECTOMY;  Surgeon: Lafonda Mosses, MD;  Location: [facility];  Service: Gynecology;  Laterality: Bilateral;  . TONSILLECTOMY  2005  . UMBILICAL HERNIA REPAIR  2001; 2005     OB History   No obstetric history on file.     Family History  Problem Relation Age of Onset  . Diabetes Mother   . Heart disease Mother   . Hyperlipidemia Mother   . Hypertension Mother   . Breast cancer Mother   . Lung cancer Father   . Drug abuse Father   . Brain cancer Father 5  . Bone cancer Father 47  . Drug abuse Sister   . Mental illness Brother   . Mental illness Maternal Grandmother   . Stomach cancer Paternal Grandmother 44       d.75    Social History   Tobacco Use  . Smoking status:  Former Smoker    Packs/day: 0.50    Years: 30.00    Pack years: 15.00    Types: Cigarettes    Quit date: 10/05/2015    Years since quitting: 5.1  . Smokeless tobacco: Never Used  Vaping Use  . Vaping Use: Former  Substance Use Topics  . Alcohol use: Yes    Alcohol/week: 2.0 standard drinks    Types: 2 Cans of beer per week    Comment: occas.  . Drug use: No    Home Medications Prior to Admission medications   Medication Sig Start Date End Date Taking? Authorizing Provider  acetaminophen (TYLENOL) 325 MG tablet Take 325-650 mg by mouth every 8 (eight) hours as needed for moderate pain.   Yes [provider]  amLODipine (NORVASC) 10 MG tablet TAKE 1 TABLET(10 MG) BY MOUTH DAILY Patient taking differently: Take 10 mg by mouth daily. 09/23/20  Yes Hilty, Nadean Corwin, MD  anastrozole (ARIMIDEX) 1 MG tablet Take 1 tablet (1 mg total) by mouth daily. 11/08/20  Yes Nicholas Lose, MD  aspirin 81 MG chewable tablet Chew 1 tablet (81 mg total) by mouth daily. 09/16/14  Yes Nita Sells, MD  atorvastatin (LIPITOR) 80 MG tablet TAKE 1 TABLET(80 MG) BY MOUTH DAILY Patient taking differently: Take 80 mg by mouth daily. 07/01/20  Yes Hilty, Nadean Corwin, MD  baclofen (LIORESAL) 10 MG tablet TAKE BY MOUTH UP TO 4 TABLETS EVERY DAY FOR MUSCLE SPASMS Patient taking differently: Take 10 mg by mouth 4 (four) times daily as needed for muscle spasms. 09/13/20  Yes Sater, Nanine Means, MD  diazepam (VALIUM) 5 MG tablet Take 1 tablet (5 mg total) by mouth every 12 (twelve) hours as needed for muscle spasms. 12/01/20  Yes Isla Pence, MD  etodolac (LODINE) 400 MG tablet Take 1 tablet (400 mg total) by mouth 2 (two) times daily. 09/20/20  Yes Sater, Nanine Means, MD  ferrous sulfate 325 (65 FE) MG tablet Take 325 mg by mouth daily.   Yes [provider]  fluticasone (FLONASE) 50 MCG/ACT nasal spray Place 1-2 sprays into both nostrils daily as needed for allergies or rhinitis.   Yes [provider]  furosemide (LASIX) 20 MG tablet Take 1 tablet (20 mg total) by mouth daily as needed (fluid retention.). Patient taking differently: Take 20 mg by mouth daily as needed for fluid (fluid retention.). 08/30/20  Yes Hilty, Nadean Corwin,  MD  gabapentin (NEURONTIN) 300 MG capsule Take 976m (3 capsules) by mouth three times daily Patient taking differently: Take 900 mg by mouth 3 (three) times daily. 09/20/20  Yes Sater, RNanine Means MD  Isopropyl Alcohol (SWIMMERS EAR DROPS OT) Place 2 drops into both ears daily as needed (water in ears).   Yes [provider]  lisinopril (ZESTRIL) 40 MG tablet Take 1 tablet (40 mg total) by mouth daily. 10/10/20  Yes Vann, Jessica U, DO  methocarbamol (ROBAXIN) 500 MG tablet TAKE 1 TABLET(500 MG) BY MOUTH THREE TIMES DAILY as needed Patient taking differently: Take 500 mg by mouth 3 (three) times daily. 11/29/20  Yes Sater, RNanine Means MD  metoprolol tartrate (LOPRESSOR) 50 MG tablet Take 1 tablet (50 mg total) by mouth 2 (two) times daily. 10/15/20 11/14/20 Yes Shah, Pratik D, DO  Multiple Vitamin (MULTIVITAMIN) tablet Take 1 tablet by mouth daily.   Yes [provider]  nitroGLYCERIN (NITROSTAT) 0.4 MG SL tablet Place 1 tablet (0.4 mg total) under the tongue every 5 (five) minutes as needed for chest pain. Patient taking differently: Place 0.4 mg under the tongue every 5 (five) minutes as needed for chest pain (max 3 doses. Call 911 after 2 doses without resolved). 04/19/20  Yes Hilty, KNadean Corwin MD  OXcarbazepine (TRILEPTAL) 300 MG tablet Take one pill qAM and two pills po qHS Patient taking differently: Take 300-600 mg by mouth See admin instructions. Takes 1 tablet (300 mg totally) by in the morning; takes 0.5 tablet (150 mg) at noon; then takes 1.5 tablets (450 mg totally) by mouth at bedtime 09/20/20  Yes Sater, RNanine Means MD  oxybutynin (DITROPAN XL) 10 MG 24 hr tablet Take 1 tablet (10 mg total) by mouth at bedtime. 08/23/20  Yes Sater,  RNanine Means MD  Probiotic Product (PROBIOTIC-10 PO) Take 1 capsule by mouth daily.   Yes [provider]  spironolactone (ALDACTONE) 25 MG tablet Take 1 tablet (25 mg total) by mouth daily. 11/11/20 12/11/20 Yes Hilty, KNadean Corwin MD  traMADol (ULTRAM) 50 MG tablet TAKE 1 TABLET(50 MG) BY MOUTH TWICE DAILY AS NEEDED Patient taking differently: Take 50 mg by mouth every 12 (twelve) hours as needed for moderate pain. 09/20/20  Yes Sater, RNanine Means MD  venlafaxine XR (EFFEXOR-XR) 75 MG 24 hr capsule TAKE 1 CAPSULE(75 MG) BY MOUTH DAILY WITH BREAKFAST Patient taking differently: Take 75 mg by mouth daily with breakfast. 11/23/20  Yes GNicholas Lose MD    Allergies    Pork-derived products  Review of Systems   Review of Systems  Cardiovascular: Positive for chest pain.  Musculoskeletal: Positive for neck pain.       Right middle finger pain  All other systems reviewed and are negative.   Physical Exam Updated Vital Signs BP (!) 141/78   Pulse 63   Temp 97.7 F (36.5 C) (Oral)   Resp 16   Ht _0  (1.549 m)   Wt 75 kg   LMP 03/06/2020   SpO2 99%   BMI 31.24 kg/m   Physical Exam Vitals and nursing note reviewed.  Constitutional:      Appearance: Normal appearance.  HENT:     Head: Normocephalic and atraumatic.     Right Ear: External ear normal.     Left Ear: External ear normal.     Nose: Nose normal.     Mouth/Throat:     Mouth: Mucous membranes are moist.     Pharynx: Oropharynx is clear.  Eyes:  Extraocular Movements: Extraocular movements intact.     Conjunctiva/sclera: Conjunctivae normal.     Pupils: Pupils are equal, round, and reactive to light.  Neck:     Comments: Muscle spasm left paraspinal muscles.  Decreased ROM due to pain and spasm. Cardiovascular:     Rate and Rhythm: Normal rate and regular rhythm.     Pulses: Normal pulses.     Heart sounds: Normal heart sounds.  Pulmonary:     Effort: Pulmonary effort is normal.     Breath sounds: Normal  breath sounds.  Abdominal:     General: Abdomen is flat. Bowel sounds are normal.     Palpations: Abdomen is soft.  Musculoskeletal:     Comments: Right 3rd finger at the pip joint pain  Skin:    General: Skin is warm.     Capillary Refill: Capillary refill takes less than 2 seconds.  Neurological:     General: No focal deficit present.     Mental Status: She is alert and oriented to person, place, and time.  Psychiatric:        Mood and Affect: Mood normal.        Behavior: Behavior normal.        Thought Content: Thought content normal.        Judgment: Judgment normal.     ED Results / Procedures / Treatments   Labs (all labs ordered are listed, but only abnormal results are displayed) Labs Reviewed  BASIC METABOLIC PANEL - Abnormal; Notable for the following components:      Result Value   Glucose, Bld 102 (*)    Creatinine, Ser 1.08 (*)    All other components within normal limits  CBC - Abnormal; Notable for the following components:   WBC 10.6 (*)    All other components within normal limits  PROTIME-INR  I-STAT BETA HCG BLOOD, ED ([facility], [facility], [facility] ONLY)  TROPONIN I (HIGH SENSITIVITY)  TROPONIN I (HIGH SENSITIVITY)    EKG EKG Interpretation  Date/Time:  Wednesday December 01 2020 07:08:09 EST Ventricular Rate:  58 PR Interval:    QRS Duration: 78 QT Interval:  443 QTC Calculation: 436 R Axis:   61 Text Interpretation: Sinus rhythm No significant change since last tracing Confirmed by Isla Pence [phone]) on 12/01/2020 7:11:55 AM   Radiology DG Chest 2 View  Result Date: 12/01/2020 CLINICAL DATA:  Chest pain. EXAM: CHEST - 2 VIEW COMPARISON:  10/14/2020. FINDINGS: Mediastinum hilar structures normal. Heart size normal. No focal infiltrate. No pleural effusion or pneumothorax. Postsurgical changes right breast. IMPRESSION: No acute cardiopulmonary disease.  Chest is stable from prior exam. Electronically Signed   By: Marcello Moores  Register   On: 12/01/2020 07:04    CT Cervical Spine Wo Contrast  Result Date: 12/01/2020 CLINICAL DATA:  Carotid artery calcification noted bilaterally. EXAM: CT CERVICAL SPINE WITHOUT CONTRAST TECHNIQUE: Multidetector CT imaging of the cervical spine was performed without intravenous contrast. Multiplanar CT image reconstructions were also generated. COMPARISON:  None. FINDINGS: Alignment: There is no appreciable spondylolisthesis. Skull base and vertebrae: The skull base and craniocervical junction regions appear normal. There is no evident fracture. There are no blastic or lytic bone lesions. Soft tissues and spinal canal: Prevertebral soft tissues and predental space regions are normal. There is no evident cord or canal hematoma. There are no paraspinous lesions. Disc levels: There is moderate disc space narrowing at C5-6 and C6-7. There is slightly milder disc space narrowing C3-4. There is facet hypertrophy at multiple  levels. There is impression on exiting nerve roots at C5-6 bilaterally and at C6-7 on the left. There is a face mint of the proximal C6-7 nerve root on the left due to bony hypertrophy. There is no frank disc extrusion or high-grade stenosis. Upper chest: Visualized upper lung regions are clear. Other: There is carotid artery calcification bilaterally. IMPRESSION: No fracture or spondylolisthesis. Osteoarthritic change at several levels. Note that there is significant impression on the exiting nerve root on the left at C6-7 due to bony hypertrophy. No frank disc extrusion or stenosis. Electronically Signed   By: Lowella Grip III M.D.   On: 12/01/2020 08:02   DG Hand 2 View Right  Result Date: 12/01/2020 CLINICAL DATA:  Pain following recent fall EXAM: RIGHT HAND - 2 VIEW COMPARISON:  None. FINDINGS: Frontal and lateral views were obtained. There is no fracture or dislocation. The joint spaces appear normal. No erosive change. IMPRESSION: No fracture or dislocation.  No evident arthropathy. Electronically Signed    By: Lowella Grip III M.D.   On: 12/01/2020 07:57    Procedures Procedures   Medications Ordered in ED Medications  amLODipine (NORVASC) tablet 10 mg (10 mg Oral Given 12/01/20 0806)  lisinopril (ZESTRIL) tablet 40 mg (40 mg Oral Given 12/01/20 0806)  spironolactone (ALDACTONE) tablet 25 mg (25 mg Oral Given 12/01/20 0806)  LORazepam (ATIVAN) injection 1 mg (1 mg Intramuscular Given 12/01/20 1642)    ED Course  I have reviewed the triage vital signs and the nursing notes.  Pertinent labs & imaging results that were available during my care of the patient were reviewed by me and considered in my medical decision making (see chart for details).    MDM Rules/Calculators/A&P                          BP is now 157/75 about 1 hr from her nl bp meds and ativan.  Elevation in bp may be from pain from her cervical spasm.  BP 117/74 at d/c.  Pt had 2 negative troponins and a nl ekg.  Stress test 2 weeks ago nl.  Pt is stable for d/c.  Return if worse.  Final Clinical Impression(s) / ED Diagnoses Final diagnoses:  Acute strain of neck muscle, initial encounter  Hypertension, unspecified type  Atypical chest pain    Rx / DC Orders ED Discharge Orders         Ordered    diazepam (VALIUM) 5 MG tablet  Every 12 hours PRN        12/01/20 0947           Isla Pence, MD 12/01/20 1120

## 2020-12-01 NOTE — Discharge Instructions (Signed)
Do not wear your neck brace.  That is contributing to your spasm in your neck.

## 2020-12-01 NOTE — ED Triage Notes (Addendum)
Patient reports central chest pain this morning with mild SOB /hypertension BP=212/119 unrelieved by 2 NTG sl , no emesis or diaphoresis , denies cough or fever , her cardiologist is Dr. Debara Pickett.

## 2020-12-14 ENCOUNTER — Encounter: Payer: Self-pay | Admitting: Cardiovascular Disease

## 2020-12-14 ENCOUNTER — Ambulatory Visit: Payer: 59 | Admitting: Family Medicine

## 2020-12-14 ENCOUNTER — Other Ambulatory Visit: Payer: Self-pay

## 2020-12-14 ENCOUNTER — Ambulatory Visit (INDEPENDENT_AMBULATORY_CARE_PROVIDER_SITE_OTHER): Payer: 59 | Admitting: Cardiovascular Disease

## 2020-12-14 DIAGNOSIS — I701 Atherosclerosis of renal artery: Secondary | ICD-10-CM

## 2020-12-14 DIAGNOSIS — I10 Essential (primary) hypertension: Secondary | ICD-10-CM

## 2020-12-14 NOTE — Progress Notes (Signed)
12/14/2020 [name]   October 02, 1971  [id number]  Primary Physician Patient, No Pcp Per Primary Cardiologist: Lorretta Harp MD Garret Reddish, [place], [place]  HPI:  Anna Henson is a 50 y.o. mild to moderately overweight single African-American female with no children referred to me by Dr. Debara Pickett for evaluation of potential renal vascular hypertension.  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  Her cardiac risk factors are notable for discontinue tobacco abuse having smoked approximately 12 pack years and discontinued in 2015.  She has treated hypertension and hyperlipidemia.  Her mother had myocardial infarction and stents.  She has had a heart attack in the past and has had RCA stenting 09/14/2014 and again 12/31/2016.  She has had hypertension for many years and has had several admissions for hypertensive urgency.  She had renal Doppler studies performed in our office 10/15/2020 that suggested mild to moderate left renal artery stenosis with a renal aortic ratio of 3.58 and subsequent CTA performed 11/19/2020 that showed moderate left renal artery stenosis in the 50% range which was confirmatory.   Current Meds  Medication Sig  . acetaminophen (TYLENOL) 325 MG tablet Take 325-650 mg by mouth every 8 (eight) hours as needed for moderate pain.  Marland Kitchen amLODipine (NORVASC) 10 MG tablet TAKE 1 TABLET(10 MG) BY MOUTH DAILY (Patient taking differently: Take 10 mg by mouth daily.)  . anastrozole (ARIMIDEX) 1 MG tablet Take 1 tablet (1 mg total) by mouth daily.  Marland Kitchen aspirin 81 MG chewable tablet Chew 1 tablet (81 mg total) by mouth daily.  Marland Kitchen atorvastatin (LIPITOR) 80 MG tablet TAKE 1 TABLET(80 MG) BY MOUTH DAILY (Patient taking differently: Take 80 mg by mouth daily.)  . baclofen (LIORESAL) 10 MG tablet TAKE BY MOUTH UP TO 4 TABLETS EVERY DAY FOR MUSCLE SPASMS (Patient taking differently: Take 10 mg by mouth 4 (four) times daily as needed for muscle spasms.)  . diazepam (VALIUM) 5 MG tablet  Take 1 tablet (5 mg total) by mouth every 12 (twelve) hours as needed for muscle spasms.  Marland Kitchen etodolac (LODINE) 400 MG tablet Take 1 tablet (400 mg total) by mouth 2 (two) times daily.  . ferrous sulfate 325 (65 FE) MG tablet Take 325 mg by mouth daily.  . fluticasone (FLONASE) 50 MCG/ACT nasal spray Place 1-2 sprays into both nostrils daily as needed for allergies or rhinitis.  . furosemide (LASIX) 20 MG tablet Take 1 tablet (20 mg total) by mouth daily as needed (fluid retention.). (Patient taking differently: Take 20 mg by mouth daily as needed for fluid (fluid retention.).)  . gabapentin (NEURONTIN) 300 MG capsule Take 900mg  (3 capsules) by mouth three times daily (Patient taking differently: Take 900 mg by mouth 3 (three) times daily.)  . Isopropyl Alcohol (SWIMMERS EAR DROPS OT) Place 2 drops into both ears daily as needed (water in ears).  Marland Kitchen lisinopril (ZESTRIL) 40 MG tablet Take 1 tablet (40 mg total) by mouth daily.  . methocarbamol (ROBAXIN) 500 MG tablet TAKE 1 TABLET(500 MG) BY MOUTH THREE TIMES DAILY as needed (Patient taking differently: Take 500 mg by mouth 3 (three) times daily.)  . nitroGLYCERIN (NITROSTAT) 0.4 MG SL tablet Place 1 tablet (0.4 mg total) under the tongue every 5 (five) minutes as needed for chest pain. (Patient taking differently: Place 0.4 mg under the tongue every 5 (five) minutes as needed for chest pain (max 3 doses. Call 911 after 2 doses without resolved).)  . OXcarbazepine (TRILEPTAL) 300 MG  tablet Take one pill qAM and two pills po qHS (Patient taking differently: Take 300-600 mg by mouth See admin instructions. Takes 1 tablet (300 mg totally) by in the morning; takes 0.5 tablet (150 mg) at noon; then takes 1.5 tablets (450 mg totally) by mouth at bedtime)  . oxybutynin (DITROPAN XL) 10 MG 24 hr tablet Take 1 tablet (10 mg total) by mouth at bedtime.  . Probiotic Product (PROBIOTIC-10 PO) Take 1 capsule by mouth daily.  . traMADol (ULTRAM) 50 MG tablet TAKE 1  TABLET(50 MG) BY MOUTH TWICE DAILY AS NEEDED (Patient taking differently: Take 50 mg by mouth every 12 (twelve) hours as needed for moderate pain.)  . venlafaxine XR (EFFEXOR-XR) 75 MG 24 hr capsule TAKE 1 CAPSULE(75 MG) BY MOUTH DAILY WITH BREAKFAST (Patient taking differently: Take 75 mg by mouth daily with breakfast.)  . [DISCONTINUED] Multiple Vitamin (MULTIVITAMIN) tablet Take 1 tablet by mouth daily.     Allergies  Allergen Reactions  . Pork-Derived Products Other (See Comments)    Does NOT eat pork    Social History   Socioeconomic History  . Marital status: Single    Spouse name: Not on file  . Number of children: 0  . Years of education: masters  . Highest education level: Not on file  Occupational History  . Occupation: Therapist, art at a call center    Employer: [employer]  Tobacco Use  . Smoking status: Former Smoker    Packs/day: 0.50    Years: 30.00    Pack years: 15.00    Types: Cigarettes    Quit date: 10/05/2015    Years since quitting: 5.1  . Smokeless tobacco: Never Used  Vaping Use  . Vaping Use: Former  Substance and Sexual Activity  . Alcohol use: Yes    Alcohol/week: 2.0 standard drinks    Types: 2 Cans of beer per week    Comment: occas.  . Drug use: No  . Sexual activity: Not Currently  Other Topics Concern  . Not on file  Social History Narrative   Consumes 2 cups of caffeine daily   Social Determinants of Health   Financial Resource Strain: Not on file  Food Insecurity: Not on file  Transportation Needs: Not on file  Physical Activity: Not on file  Stress: Not on file  Social Connections: Not on file  Intimate Partner Violence: Not on file     Review of Systems: General: negative for chills, fever, night sweats or weight changes.  Cardiovascular: negative for chest pain, dyspnea on exertion, edema, orthopnea, palpitations, paroxysmal nocturnal dyspnea or shortness of breath Dermatological: negative for rash Respiratory: negative  for cough or wheezing Urologic: negative for hematuria Abdominal: negative for nausea, vomiting, diarrhea, bright red blood per rectum, melena, or hematemesis Neurologic: negative for visual changes, syncope, or dizziness All other systems reviewed and are otherwise negative except as noted above.    Blood pressure 127/75, pulse 81, height 5\' 2"  (1.575 m), weight 153 lb 9.6 oz (69.7 kg), last menstrual period 03/06/2020, SpO2 97 %.  General appearance: alert and no distress Neck: no adenopathy, no carotid bruit, no JVD, supple, symmetrical, trachea midline and thyroid not enlarged, symmetric, no tenderness/mass/nodules Lungs: clear to auscultation bilaterally Heart: regular rate and rhythm, S1, S2 normal, no murmur, click, rub or gallop Extremities: extremities normal, atraumatic, no cyanosis or edema Pulses: 2+ and symmetric Skin: Skin color, texture, turgor normal. No rashes or lesions Neurologic: Alert and oriented X 3, normal strength and tone.  Normal symmetric reflexes. Normal coordination and gait  EKG not performed today  ASSESSMENT AND PLAN:   Essential hypertension Ms. Sauerwein was referred to me by Dr. Debara Pickett for evaluation of difficult to control/labile hypertension.  She had renal Doppler studies performed 10/15/2020 that showed a left renal aortic ratio 3.58 with symmetric renal dimensions.  As a follow-up for this she had a CTA performed 11/19/2020 which showed only moderate proximal left renal artery stenosis in the 50% range.  This did not appear to be hemodynamically significant nor do I think is contributing to her hypertension.  She says that her hypertension typically spikes at night and she is awakened with this and checks her blood pressure.  Her blood pressure today in the office is 127/75.  She is on amlodipine, metoprolol, lisinopril and Aldactone.  I have asked her to keep a blood pressure log for the next 30 days.  She will return after that to review these with a  pharmacist he will make appropriate changes based on her readings.      Lorretta Harp MD FACP,FACC,FAHA, [facility] 12/14/2020 3:13 PM

## 2020-12-14 NOTE — Assessment & Plan Note (Signed)
Ms. Veillon was referred to me by Dr. Debara Pickett for evaluation of difficult to control/labile hypertension.  She had renal Doppler studies performed 10/15/2020 that showed a left renal aortic ratio 3.58 with symmetric renal dimensions.  As a follow-up for this she had a CTA performed 11/19/2020 which showed only moderate proximal left renal artery stenosis in the 50% range.  This did not appear to be hemodynamically significant nor do I think is contributing to her hypertension.  She says that her hypertension typically spikes at night and she is awakened with this and checks her blood pressure.  Her blood pressure today in the office is 127/75.  She is on amlodipine, metoprolol, lisinopril and Aldactone.  I have asked her to keep a blood pressure log for the next 30 days.  She will return after that to review these with a pharmacist he will make appropriate changes based on her readings.

## 2020-12-14 NOTE — Patient Instructions (Signed)
Medication Instructions:  Your physician recommends that you continue on your current medications as directed. Please refer to the Current Medication list given to you today.  *If you need a refill on your cardiac medications before your next appointment, please call your pharmacy*   Follow-Up: At [facility], you and your health needs are our priority.  As part of our continuing mission to provide you with exceptional heart care, we have created designated Provider Care Teams.  These Care Teams include your primary Cardiologist (physician) and Advanced Practice Providers (APPs -  Physician Assistants and Nurse Practitioners) who all work together to provide you with the care you need, when you need it.  We recommend signing up for the patient portal called "MyChart".  Sign up information is provided on this After Visit Summary.  MyChart is used to connect with patients for Virtual Visits (Telemedicine).  Patients are able to view lab/test results, encounter notes, upcoming appointments, etc.  Non-urgent messages can be sent to your provider as well.   To learn more about what you can do with MyChart, go to [email or web address].    Your next appointment:   6 month(s)  The format for your next appointment:   In Person  Provider:   Quay Burow, MD   Other Instructions Please keep a blood pressure log for the next 30 days then come back to see a PharmD for follow up.

## 2020-12-21 ENCOUNTER — Ambulatory Visit: Payer: 59 | Admitting: Internal Medicine

## 2020-12-27 ENCOUNTER — Other Ambulatory Visit: Payer: Self-pay | Admitting: Internal Medicine

## 2020-12-27 DIAGNOSIS — E785 Hyperlipidemia, unspecified: Secondary | ICD-10-CM

## 2021-01-05 DIAGNOSIS — Z0289 Encounter for other administrative examinations: Secondary | ICD-10-CM

## 2021-01-06 ENCOUNTER — Encounter: Payer: Self-pay | Admitting: Hematology and Oncology

## 2021-01-06 NOTE — Telephone Encounter (Signed)
Gave completed/signed FMLA back to medical records to process for pt. 

## 2021-01-07 ENCOUNTER — Other Ambulatory Visit: Payer: Self-pay | Admitting: Neurology

## 2021-01-10 ENCOUNTER — Telehealth: Payer: Self-pay | Admitting: *Deleted

## 2021-01-10 ENCOUNTER — Other Ambulatory Visit: Payer: Self-pay | Admitting: Internal Medicine

## 2021-01-10 ENCOUNTER — Other Ambulatory Visit: Payer: Self-pay | Admitting: *Deleted

## 2021-01-10 ENCOUNTER — Other Ambulatory Visit: Payer: Self-pay | Admitting: Neurology

## 2021-01-10 DIAGNOSIS — R079 Chest pain, unspecified: Secondary | ICD-10-CM

## 2021-01-10 MED ORDER — METHOCARBAMOL 500 MG PO TABS: ORAL_TABLET | ORAL | 1 refills | Status: DC

## 2021-01-10 NOTE — Telephone Encounter (Signed)
Pt fmla form faxed on 01/10/21 [phone]

## 2021-01-14 ENCOUNTER — Ambulatory Visit (INDEPENDENT_AMBULATORY_CARE_PROVIDER_SITE_OTHER): Payer: 59 | Admitting: Pharmacist Clinician (PhC)/ Clinical Pharmacy Specialist

## 2021-01-14 ENCOUNTER — Other Ambulatory Visit: Payer: Self-pay

## 2021-01-14 VITALS — BP 164/80 | HR 58 | Ht 61.5 in

## 2021-01-14 DIAGNOSIS — I10 Essential (primary) hypertension: Secondary | ICD-10-CM | POA: Diagnosis not present

## 2021-01-14 MED ORDER — SPIRONOLACTONE 50 MG PO TABS: ORAL_TABLET | ORAL | 3 refills | Status: DC

## 2021-01-14 MED ORDER — LISINOPRIL 40 MG PO TABS: 40.0000 mg | ORAL_TABLET | Freq: Every day | ORAL | 3 refills | Status: DC

## 2021-01-14 NOTE — Patient Instructions (Signed)
Return for a a follow up appointment May 31 with physician assistant  Go to the lab in 2 weeks to check potassium levels  Check your blood pressure at home daily and keep record of the readings.  Take your BP meds as follows:   Take all 40 mg of lisinopril at night  Increase Spironolactone to 50 mg in the mornings and 25 mg in the evenings  Continue with all other medications  Bring all of your meds, your BP cuff and your record of home blood pressures to your next appointment.  Exercise as you're able, try to walk approximately 30 minutes per day.  Keep salt intake to a minimum, especially watch canned and prepared boxed foods.  Eat more fresh fruits and vegetables and fewer canned items.  Avoid eating in fast food restaurants.    HOW TO TAKE YOUR BLOOD PRESSURE: . Rest 5 minutes before taking your blood pressure. .  Don't smoke or drink caffeinated beverages for at least 30 minutes before. . Take your blood pressure before (not after) you eat. . Sit comfortably with your back supported and both feet on the floor (don't cross your legs). . Elevate your arm to heart level on a table or a desk. . Use the proper sized cuff. It should fit smoothly and snugly around your bare upper arm. There should be enough room to slip a fingertip under the cuff. The bottom edge of the cuff should be 1 inch above the crease of the elbow. . Ideally, take 3 measurements at one sitting and record the average.

## 2021-01-14 NOTE — Progress Notes (Signed)
01/14/2021 [name] [birth year] [id number]   HPI:  Anna Henson is a 50 y.o. female patient of Dr Gwenlyn Found, with a [place] below who presents today for hypertension clinic evaluation.  She saw Dr. Gwenlyn Found last month at which time her BP was controlled in the office.  However patient noted that her pressure at home was often elevated in the evening hours.  Her history is also significant fo rseveral hospital admissions for hypertensive urgency.  RA dopplers showed 60-79% blockage of left renal artery, an subsequent CTA showed 50% range.  This is doubtfully the cause of her hypertension.    She is in the office today to review her home BP results.  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  She takes daily NSAID's as well as muscle relaxants and gabapentin.  States that most days her pain level can run 6-8/10, but when she gets up in the mornings feels more like a 10-12/10.  She was diagnosed with hypertension back in 2005 and notes that she was well controlled until about 2015.  At that time she moved to [facility] to help care for her parents.     164/80  Past Medical History: ASCVD MI s/p RCA stents 2015, 2018  hyperlipidemia 1/22 - LDL 161 - no meds, now on atorvastatin 80  CHF EF 60-65% by echo, grade 2 diastolic dysfunction  Transverse myelitis On chronic anti-inflammatory and muscle relaxants     Blood Pressure Goal:  130/80  Current Medications: amlodipine 10 mg am, metoprolol tart 50 mg bid spironolactone 50 mg qd and lisinopril 20 mg bid  Family Hx: mother now 66, hypertension, heart disease, father died last year - cancers; sister with pancreatic caner, hypertension; brother with high cholesterol but nothing else;   Social Hx: smoked from ages [age], none since; no alcohol; drinks regular coffee in the morning, decaf later in the day; cut out soda, now only water  Diet: mostly home cooked, plenty of green vegetables, salads, knows  she is sensitive to salt  Exercise: no reglary exercise, chronic pain, does some walking, but not as much as would like (7 min twice daily is limit)  Home BP readings: no readings with her - notes 150's/80's; nothing over 90 diastolic; higher in evenings and overnight,   Intolerances: nkda  Labs: 2/22:  Na 139, K 4.0, Glu 102, BUN 10, SCr 1.08, GFR > 60   Wt Readings from Last 3 Encounters:  12/14/20 153 lb 9.6 oz (69.7 kg)  12/01/20 165 lb 5.5 oz (75 kg)  11/19/20 152 lb (68.9 kg)   BP Readings from Last 3 Encounters:  12/14/20 127/75  12/01/20 (!) 163/86  11/11/20 140/76   Pulse Readings from Last 3 Encounters:  12/14/20 81  12/01/20 68  11/11/20 (!) 59    Current Outpatient Medications  Medication Sig Dispense Refill  . acetaminophen (TYLENOL) 325 MG tablet Take 325-650 mg by mouth every 8 (eight) hours as needed for moderate pain.    Marland Kitchen amLODipine (NORVASC) 10 MG tablet TAKE 1 TABLET(10 MG) BY MOUTH DAILY (Patient taking differently: Take 10 mg by mouth daily.) 90 tablet 2  . anastrozole (ARIMIDEX) 1 MG tablet Take 1 tablet (1 mg total) by mouth daily. 90 tablet 3  . aspirin 81 MG chewable tablet Chew 1 tablet (81 mg total) by mouth daily.    Marland Kitchen atorvastatin (LIPITOR) 80 MG tablet TAKE 1 TABLET(80 MG) BY MOUTH DAILY 30 tablet 1  .  baclofen (LIORESAL) 10 MG tablet TAKE BY MOUTH UP TO 4 TABLETS EVERY DAY FOR MUSCLE SPASMS (Patient taking differently: Take 10 mg by mouth 4 (four) times daily as needed for muscle spasms.) 120 tablet 5  . diazepam (VALIUM) 5 MG tablet Take 1 tablet (5 mg total) by mouth every 12 (twelve) hours as needed for muscle spasms. 10 tablet 0  . etodolac (LODINE) 400 MG tablet Take 1 tablet (400 mg total) by mouth 2 (two) times daily. 60 tablet 11  . ferrous sulfate 325 (65 FE) MG tablet Take 325 mg by mouth daily.    . fluticasone (FLONASE) 50 MCG/ACT nasal spray Place 1-2 sprays into both nostrils daily as needed for allergies or rhinitis.    .  furosemide (LASIX) 20 MG tablet Take 1 tablet (20 mg total) by mouth daily as needed (fluid retention.). (Patient taking differently: Take 20 mg by mouth daily as needed for fluid (fluid retention.).) 90 tablet 0  . gabapentin (NEURONTIN) 300 MG capsule Take 980m (3 capsules) by mouth three times daily (Patient taking differently: Take 900 mg by mouth 3 (three) times daily.) 270 capsule 5  . Isopropyl Alcohol (SWIMMERS EAR DROPS OT) Place 2 drops into both ears daily as needed (water in ears).    .Marland Kitchenlisinopril (ZESTRIL) 40 MG tablet Take 1 tablet (40 mg total) by mouth daily. 30 tablet 0  . methocarbamol (ROBAXIN) 500 MG tablet TAKE 1 TABLET(500 MG) BY MOUTH THREE TIMES DAILY as needed 90 tablet 1  . metoprolol tartrate (LOPRESSOR) 50 MG tablet Take 1 tablet (50 mg total) by mouth 2 (two) times daily. 60 tablet 2  . nitroGLYCERIN (NITROSTAT) 0.4 MG SL tablet PLACE 1 TABLET UNDER THE TONGUE EVERY 5 MINUTES AS NEEDED FOR CHEST PAIN, AS DIRECTED 25 tablet 4  . OXcarbazepine (TRILEPTAL) 300 MG tablet Take one pill qAM and two pills po qHS (Patient taking differently: Take 300-600 mg by mouth See admin instructions. Takes 1 tablet (300 mg totally) by in the morning; takes 0.5 tablet (150 mg) at noon; then takes 1.5 tablets (450 mg totally) by mouth at bedtime) 90 tablet 11  . oxybutynin (DITROPAN XL) 10 MG 24 hr tablet Take 1 tablet (10 mg total) by mouth at bedtime. 30 tablet 5  . Probiotic Product (PROBIOTIC-10 PO) Take 1 capsule by mouth daily.    .Marland Kitchenspironolactone (ALDACTONE) 25 MG tablet Take 1 tablet (25 mg total) by mouth daily. 90 tablet 3  . traMADol (ULTRAM) 50 MG tablet TAKE 1 TABLET(50 MG) BY MOUTH TWICE DAILY AS NEEDED (Patient taking differently: Take 50 mg by mouth every 12 (twelve) hours as needed for moderate pain.) 60 tablet 5  . venlafaxine XR (EFFEXOR-XR) 75 MG 24 hr capsule TAKE 1 CAPSULE(75 MG) BY MOUTH DAILY WITH BREAKFAST (Patient taking differently: Take 75 mg by mouth daily with  breakfast.) 90 capsule 3   No current facility-administered medications for this visit.    Allergies  Allergen Reactions  . Pork-Derived Products Other (See Comments)    Does NOT eat pork    Past Medical History:  Diagnosis Date  . Anemia   . Anxiety   . Bipolar disorder in full remission ([facility]est   . CAD in native artery cardiologist--  dr hilty   a. 09/2014 Cath/PCI in setting of [facility]- s/p 2.25 x 12 mm Promus Premier DES to mid RCA;  b. 11/2016 Myoview: EF 56%, no ischemia;  c. 12/2016 NSTEMI/PCI: LM nl, LAd 25p, LCX 30p, RCA 100p (  2.75x38 Promus Premier DES), mRCA 10 ISR, EF 50-55%.  . CKD (chronic kidney disease), stage III ([place])   . Depression   . Genetic testing 04/23/2017   Ms. Novak underwent genetic counseling and testing for hereditary cancer syndromes on 04/05/2017. Her results were negative for mutations in all 46 genes analyzed by [facility]'s 46-gene Common Hereditary Cancers Panel. Genes analyzed include: APC, ATM, AXIN2, BARD1, BMPR1A, BRCA1, BRCA2, BRIP1, CDH1, CDKN2A, CHEK2, CTNNA1, DICER1, EPCAM, GREM1, HOXB13, KIT, MEN1, MLH1, MSH2, MSH3, MSH6, MUTYH, NBN,  . GERD (gastroesophageal reflux disease)   . Headache   . History of external beam radiation therapy    right breast 07-27-2017  to 09-25-2017  . History of non-ST elevation myocardial infarction (NSTEMI)   . HOCM (hypertrophic obstructive cardiomyopathy) ([place]) followed by cardiology   a. 12/2016 Echo: EF 65-70%,  mod conc LVH, dynamic obstruction @ rest, peak velocity of 291 cm/sec w/ peak gradient of 18mHg, no rwma, Gr1 DD, triv TR, PASP 115mg.  . Marland Kitchenyperlipidemia   . Hypertension   . Malignant neoplasm of lower-outer quadrant of right breast of female, estrogen receptor positive [facility] dr gu[facility]indi Adie dx 06/ 2018--- right breast invasive lobular carcinoma, ductal carcinoma w/ LCIS---- 06-21-2017 s/p right breast lumpecotmy w/ sln disseciton;   completed radiation 09-25-2017;  on tamoxifen  . Myocardial  infarction (HCAspen   2017  . S/P drug eluting coronary stent placement    [facility]14-2014[facility]  PCI with DES x1 to proxRCA  . Trans[facility] dr saFelecia Shelling 01/ 2017  dx transverse myelitis w/ right side numbness (09-01-2019  currently lower extremity weakness, mucsle spasms, and gait disturbance)  . Wears glasses     Last menstrual period 03/06/2020.  No problem-specific Assessment & Plan notes found for this encounter.   KrTommy MedalharmD CPP [facility] [address]uBelmontrJewettNC [phone]

## 2021-01-18 ENCOUNTER — Encounter: Payer: Self-pay | Admitting: Pharmacist Clinician (PhC)/ Clinical Pharmacy Specialist

## 2021-01-18 MED ORDER — SPIRONOLACTONE 50 MG PO TABS: ORAL_TABLET | ORAL | 1 refills | Status: DC

## 2021-01-18 NOTE — Assessment & Plan Note (Signed)
Patient with essential hypertension, still not at BP goal despite compliance with 4 medications.  Will have her move the lisinopril to 40 mg once daily rather than 20 mg bid and increase the spironolactone to 50 mg qam and 25 mg qhs.  She should continue with all other medications.  She will need to repeat metabolic panel in 2-3 weeks.  She is scheduled to see a PA in late May and we can follow up with her after that should it be needed.

## 2021-01-26 ENCOUNTER — Other Ambulatory Visit: Payer: Self-pay | Admitting: *Deleted

## 2021-01-26 MED ORDER — TRAMADOL HCL 50 MG PO TABS: ORAL_TABLET | ORAL | 5 refills | Status: DC

## 2021-01-26 NOTE — Telephone Encounter (Signed)
Called [id number] - [place],  - [address] AT [place] [address] at Phone:  [phone]. Spoke with tech, cancelled rx tramadol on file there since pt wants rx to go to another pharmacy. They cx rx.

## 2021-01-26 NOTE — Progress Notes (Signed)
Done. thanks

## 2021-01-31 ENCOUNTER — Other Ambulatory Visit: Payer: Self-pay | Admitting: *Deleted

## 2021-01-31 DIAGNOSIS — G373 Acute transverse myelitis in demyelinating disease of central nervous system: Secondary | ICD-10-CM

## 2021-01-31 DIAGNOSIS — R269 Unspecified abnormalities of gait and mobility: Secondary | ICD-10-CM

## 2021-02-08 ENCOUNTER — Telehealth: Payer: Self-pay | Admitting: Neurology

## 2021-02-08 NOTE — Telephone Encounter (Addendum)
I called [facility] and [facility] in [place] and they do not do wheelchair evaluations. Appears [facility] only place that can do the evaluation for wheelchair in the area. I called pt to let her know. She was told by [facility] that they only do 4 appt/month. They will call her to schedule once able. Pt will do some research and see if she can find another location. If she is able to , she will call me.  She received call while I was on the phone with her. She placed me on hold. [place] called her. Told her that there are 9 people in front of her. Will have appt sometime in July.

## 2021-02-08 NOTE — Telephone Encounter (Signed)
Pt called, can you recommend another place wheelchair assessment? Would like a call from the nurse.

## 2021-02-15 ENCOUNTER — Ambulatory Visit: Payer: 59 | Admitting: Physical Therapy

## 2021-02-18 ENCOUNTER — Other Ambulatory Visit: Payer: Self-pay | Admitting: Neurology

## 2021-03-07 ENCOUNTER — Other Ambulatory Visit: Payer: Self-pay | Admitting: Neurology

## 2021-03-08 ENCOUNTER — Ambulatory Visit: Payer: 59 | Admitting: Physician Assistant

## 2021-03-10 NOTE — Telephone Encounter (Signed)
LVM for Anna Henson to call back.  She was out of the office til later this afternoon.    Need dates for office visits to be sent and if they need an order for the wheelchair?

## 2021-03-17 IMAGING — CT CT CERVICAL SPINE W/O CM
4 series · 14 of 33 positions shown, 17 images · non-contrast
Comparison: None.

CLINICAL DATA: Carotid artery calcification noted bilaterally.

EXAM:
CT CERVICAL SPINE WITHOUT CONTRAST
TECHNIQUE: Multidetector CT imaging of the cervical spine was performed without
intravenous contrast. Multiplanar CT image reconstructions were also
generated.

[Series 4: c_spine 2.0 st · axial · 0.30mm/px · z∈[-220,-98]mm · 5 of 93 slices shown, 7 images]
[im 16/93  soft-tissue]
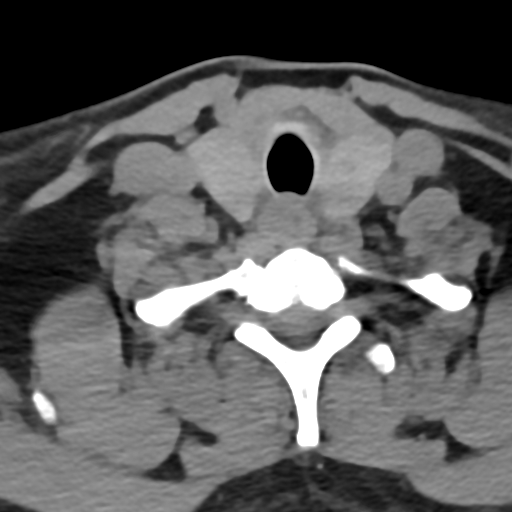
[im 16/93  bone]
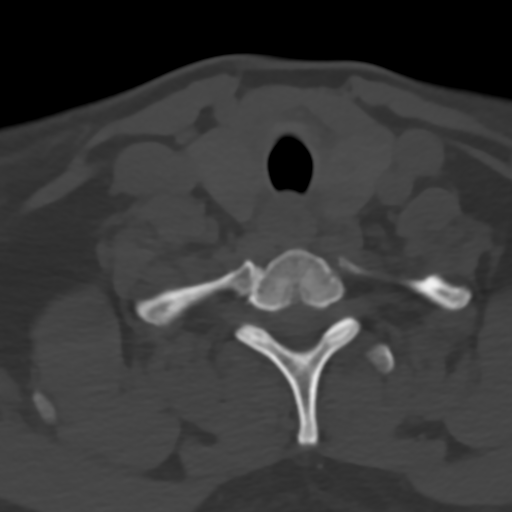
[im 31/93  bone]
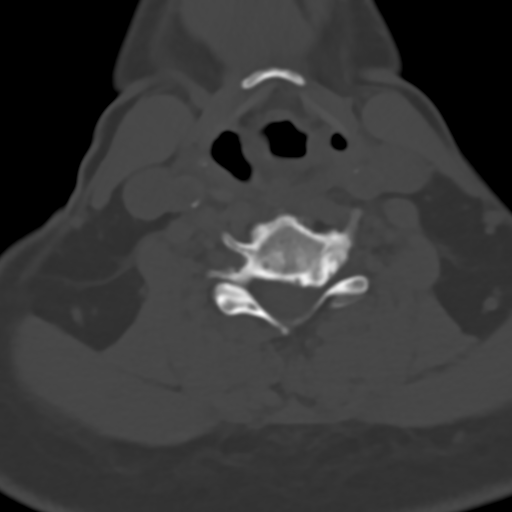
[im 47/93  bone]
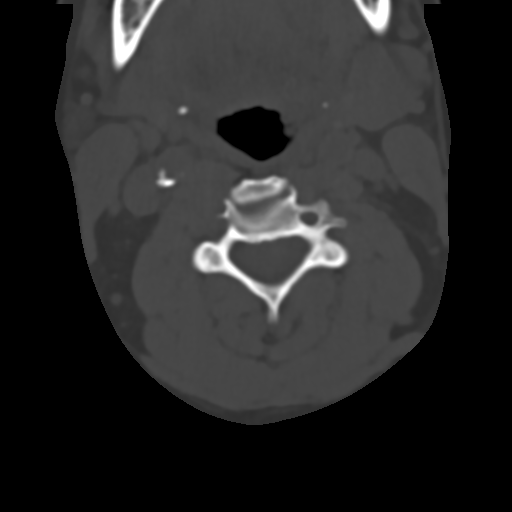
[im 62/93  bone]
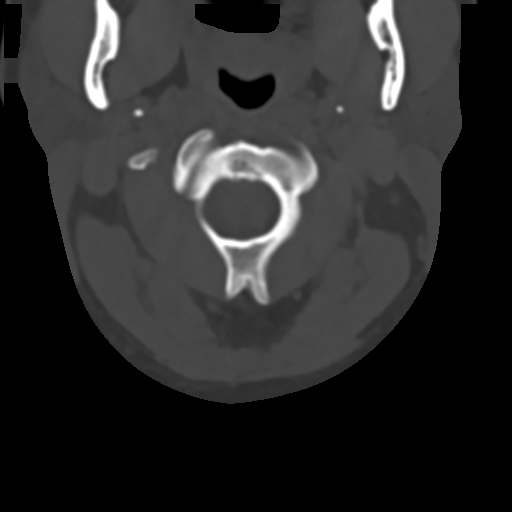
[im 77/93  soft-tissue]
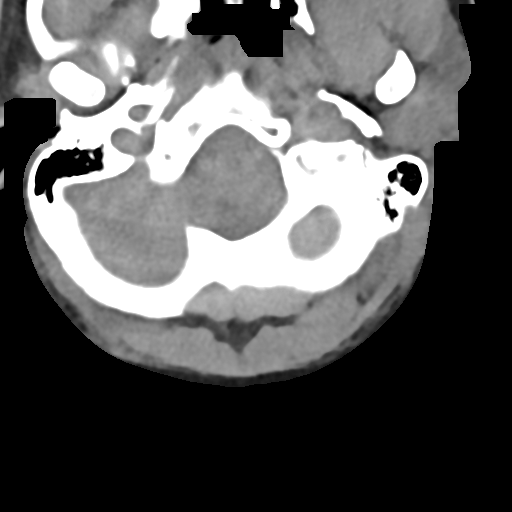
[im 77/93  bone]
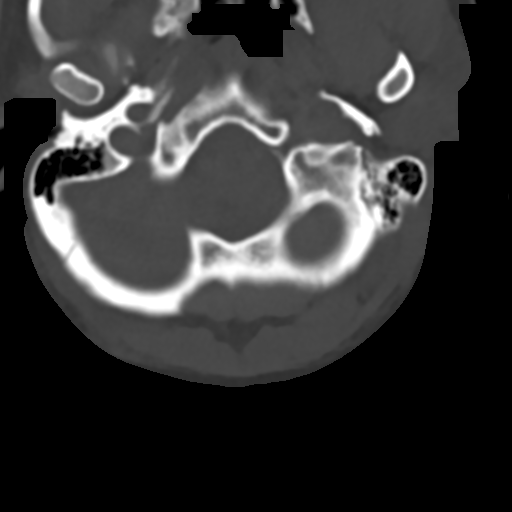

[Series 6: c_spine 2.0 sag bone · sagittal · 0.35mm/px · 5 of 54 slices shown, 6 images]
[im 18/54  bone]
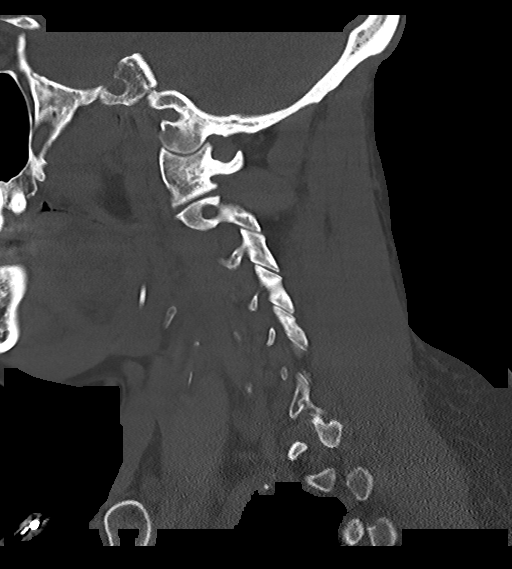
[im 23/54  bone]
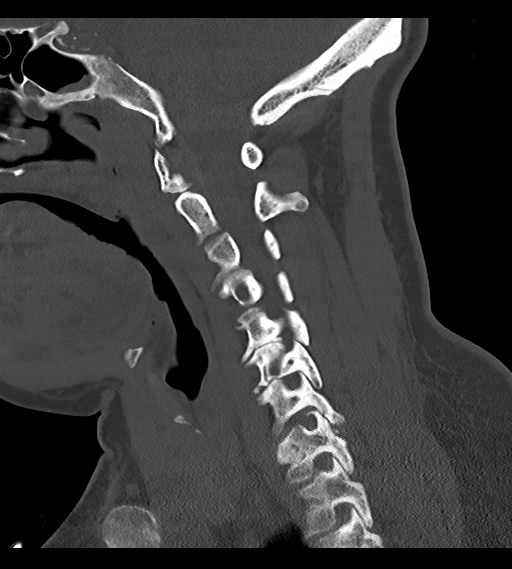
[im 27/54  soft-tissue]
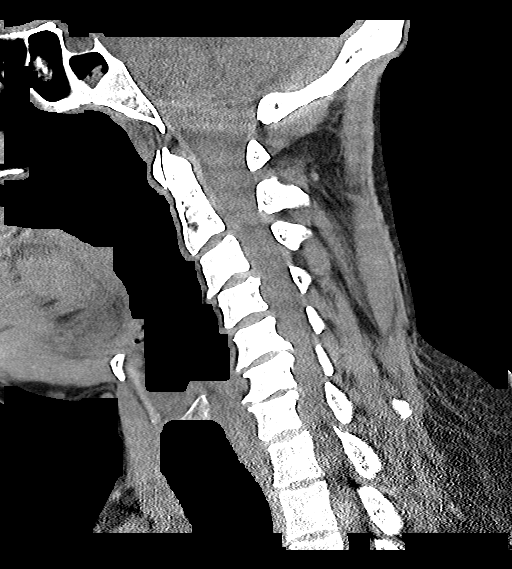
[im 27/54  bone]
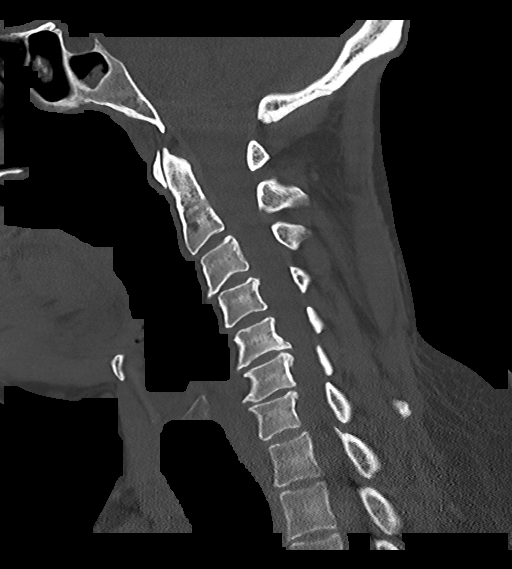
[im 31/54  bone]
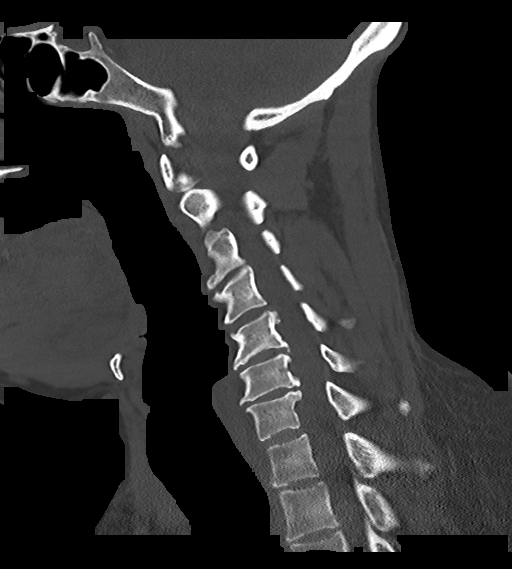
[im 36/54  bone]
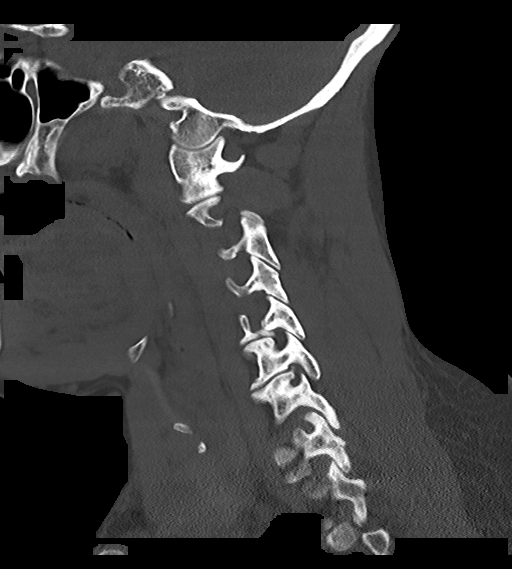

[Series 7: c_spine 2.0 cor bone · coronal · 0.32mm/px · 3 of 53 slices shown]
[im 11/53  bone]
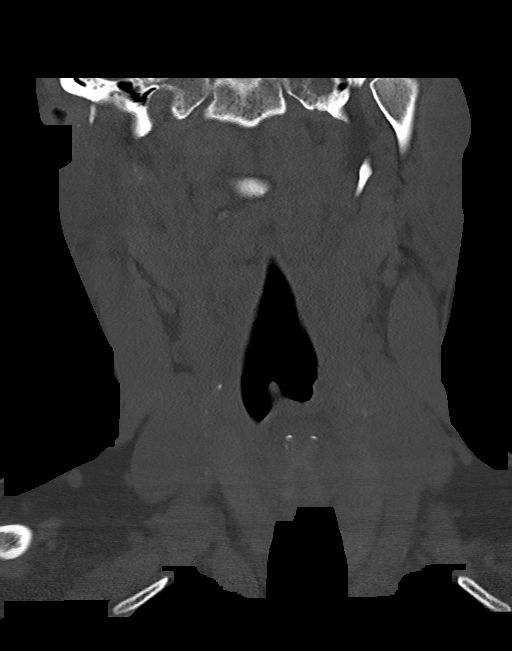
[im 21/53  bone]
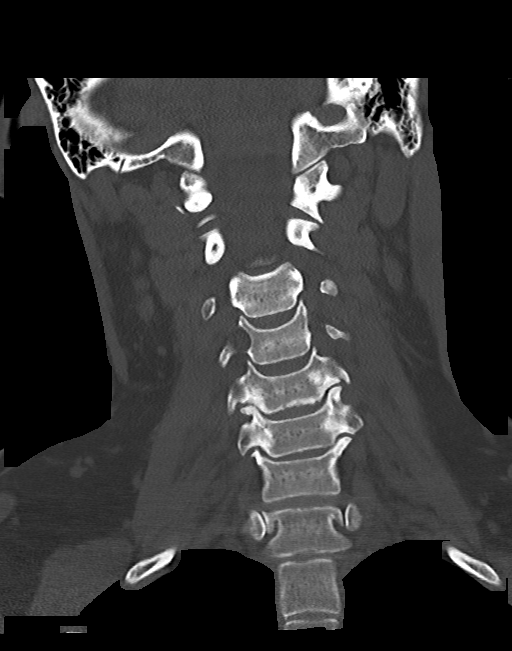
[im 32/53  bone]
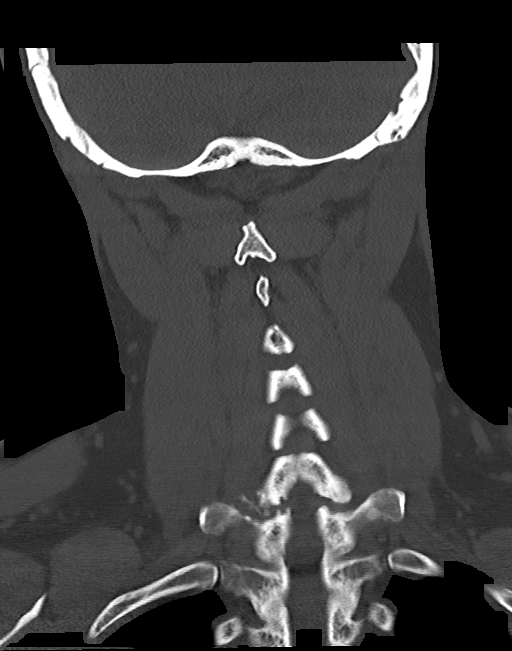

[Series 8: c_spine 2.0 orthogonals · axial · 0.21mm/px · 1 of 86 slices shown]
[im 15/86  bone]
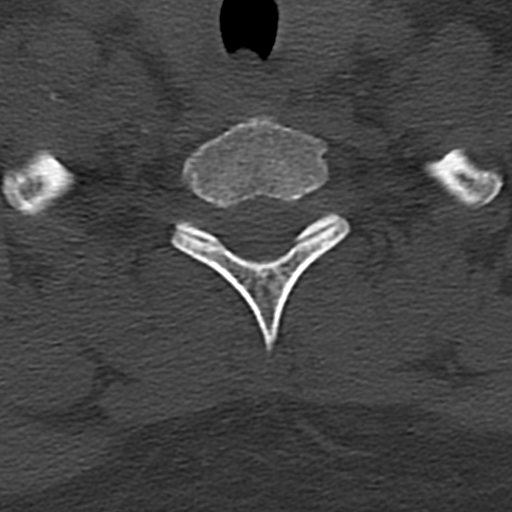

[14 of 33 positions shown; findings below may reference images not displayed]

FINDINGS: Alignment: There is no appreciable spondylolisthesis.

Skull base and vertebrae: The skull base and craniocervical junction
regions appear normal. There is no evident fracture. There are no
blastic or lytic bone lesions.

Soft tissues and spinal canal: Prevertebral soft tissues and
predental space regions are normal. There is no evident cord or
canal hematoma. There are no paraspinous lesions.

Disc levels: There is moderate disc space narrowing at C5-6 and
C6-7. There is slightly milder disc space narrowing C3-4. There is
facet hypertrophy at multiple levels. There is impression on exiting
nerve roots at C5-6 bilaterally and at C6-7 on the left. There is a
face mint of the proximal C6-7 nerve root on the left due to bony
hypertrophy. There is no frank disc extrusion or high-grade
stenosis.

Upper chest: Visualized upper lung regions are clear.

Other: There is carotid artery calcification bilaterally.
IMPRESSION: No fracture or spondylolisthesis. Osteoarthritic change at several
levels. Note that there is significant impression on the exiting
nerve root on the left at C6-7 due to bony hypertrophy. No frank
disc extrusion or stenosis.

## 2021-03-21 ENCOUNTER — Ambulatory Visit: Payer: 59 | Admitting: Physician Assistant

## 2021-03-22 ENCOUNTER — Encounter: Payer: Self-pay | Admitting: Neurology

## 2021-03-22 ENCOUNTER — Ambulatory Visit: Payer: 59 | Admitting: Neurology

## 2021-04-04 ENCOUNTER — Ambulatory Visit: Payer: 59 | Attending: Neurology

## 2021-04-04 ENCOUNTER — Telehealth: Payer: Self-pay | Admitting: *Deleted

## 2021-04-04 ENCOUNTER — Other Ambulatory Visit: Payer: Self-pay

## 2021-04-04 DIAGNOSIS — G373 Acute transverse myelitis in demyelinating disease of central nervous system: Secondary | ICD-10-CM | POA: Insufficient documentation

## 2021-04-04 DIAGNOSIS — R209 Unspecified disturbances of skin sensation: Secondary | ICD-10-CM | POA: Insufficient documentation

## 2021-04-04 DIAGNOSIS — Z9181 History of falling: Secondary | ICD-10-CM | POA: Insufficient documentation

## 2021-04-04 DIAGNOSIS — R2689 Other abnormalities of gait and mobility: Secondary | ICD-10-CM | POA: Insufficient documentation

## 2021-04-04 DIAGNOSIS — M6281 Muscle weakness (generalized): Secondary | ICD-10-CM | POA: Diagnosis present

## 2021-04-04 NOTE — Therapy (Signed)
[place] [place], [place], [address] Phone: [phone]   Fax:  [phone]  Physical Therapy Wheelchair Evaluation  Patient Details  Name: Anna Henson MRN: [id number] Date of Birth: 03/06/1971 Referring Provider (PT): Krista Blue   Wheelchair evaluation was completed at today's visit.  Deberah Pelton, ATP from [employer]  will be doing a home visit for further evaluation of appropriate equipment.  Full letter of medical necessity will be available once final recommendations made  Encounter Date: 04/04/2021   PT End of Session - 04/04/21 1144     Visit Number 1    Number of Visits --    PT Start Time 1000    PT Stop Time 1055    PT Time Calculation (min) 55 min    Activity Tolerance Patient tolerated treatment well;Patient limited by pain;Patient limited by fatigue    Behavior During Therapy Anna Henson for tasks assessed/performed             Past Medical History:  Diagnosis Date   Anemia    Anxiety    Bipolar disorder in full remission ([place])    CAD in native artery cardiologist--  dr hilty   a. 09/2014 Cath/PCI in setting of [place] - s/p 2.25 x 12 mm Promus Premier DES to mid RCA;  b. 11/2016 Myoview: EF 56%, no ischemia;  c. 12/2016 NSTEMI/PCI: LM nl, LAd 25p, LCX 30p, RCA 100p (2.75x38 Promus Premier DES), mRCA 10 ISR, EF 50-55%.   CKD (chronic kidney disease), stage III ([place])    Depression    Genetic testing 04/23/2017   Ms. Gaby underwent genetic counseling and testing for hereditary cancer syndromes on 04/05/2017. Her results were negative for mutations in all 46 genes analyzed by [facility]'s 46-gene Common Hereditary Cancers Panel. Genes analyzed include: APC, ATM, AXIN2, BARD1, BMPR1A, BRCA1, BRCA2, BRIP1, CDH1, CDKN2A, CHEK2, CTNNA1, DICER1, EPCAM, GREM1, HOXB13, KIT, MEN1, MLH1, MSH2, MSH3, MSH6, MUTYH, NBN,   GERD (gastroesophageal reflux disease)    Headache    History of external beam radiation therapy    right  breast 07-27-2017  to 09-25-2017   History of non-ST elevation myocardial infarction (NSTEMI)    HOCM (hypertrophic obstructive cardiomyopathy) ([place]) followed by cardiology   a. 12/2016 Echo: EF 65-70%,  mod conc LVH, dynamic obstruction @ rest, peak velocity of 291 cm/sec w/ peak gradient of 64mmHg, no rwma, Gr1 DD, triv TR, PASP 21mmHg.   Hyperlipidemia    Hypertension    Malignant neoplasm of lower-outer quadrant of right breast of female, estrogen receptor positive [facility]) oncologist--- dr Lindi Adie   dx 06/ 2018--- right breast invasive lobular carcinoma, ductal carcinoma w/ LCIS---- 06-21-2017 s/p right breast lumpecotmy w/ sln disseciton;   completed radiation 09-25-2017;  on tamoxifen   Myocardial infarction [facility])    2017   S/P drug eluting coronary stent placement    09-14-2014---  PCI with DES x1 to midRCA;  12-31-2016  PCI with DES x1 to proxRCA   Transverse myelitis [facility]) neurologist--- dr Felecia Shelling   01/ 2017  dx transverse myelitis w/ right side numbness (09-01-2019  currently lower extremity weakness, mucsle spasms, and gait disturbance)   Wears glasses     Past Surgical History:  Procedure Laterality Date   BREAST LUMPECTOMY WITH RADIOACTIVE SEED AND SENTINEL LYMPH NODE BIOPSY Right 06/21/2017   Procedure: RIGHT BREAST BRACKETED SEED GUIDED LUMPECTOMY AND SENTINEL LYMPH NODE BIOPSY;  Surgeon: Jovita Kussmaul, MD;  Location: [place];  Service: General;  Laterality: Right;  CORONARY/GRAFT ACUTE MI REVASCULARIZATION N/A 12/31/2016   Procedure: Coronary/Graft Acute MI Revascularization;  Surgeon: Sherren Mocha, MD;  Location: [place] [facility];  Service: Cardiovascular;  Laterality: N/A;   DILATATION & CURETTAGE/HYSTEROSCOPY WITH MYOSURE N/A 09/02/2019   Procedure: DILATATION & CURETTAGE/HYSTEROSCOPY WITH MYOSURE;  Surgeon: Aloha Gell, MD;  Location: [place];  Service: Gynecology;  Laterality: N/A;   HERNIA REPAIR     LEFT HEART CATH AND CORONARY ANGIOGRAPHY N/A  12/31/2016   Procedure: Left Heart Cath and Coronary Angiography;  Surgeon: Sherren Mocha, MD;  Location: [place] [facility];  Service: Cardiovascular;  Laterality: N/A;   LEFT HEART CATHETERIZATION WITH CORONARY ANGIOGRAM N/A 09/14/2014   Procedure: LEFT HEART CATHETERIZATION WITH CORONARY ANGIOGRAM;  Surgeon: Burnell Blanks, MD;  Location: [facility] CATH LAB;  Service: Cardiovascular;  Laterality: N/A;   MASS EXCISION N/A 03/30/2020   Procedure: EXCISION MASS RIGHT LABIA MINORA;  Surgeon: Lafonda Mosses, MD;  Location: [facility];  Service: Gynecology;  Laterality: N/A;   PERCUTANEOUS CORONARY STENT INTERVENTION (PCI-S)  09/14/2014   Procedure: PERCUTANEOUS CORONARY STENT INTERVENTION (PCI-S);  Surgeon: Burnell Blanks, MD;  Location: [facility];  Service: Cardiovascular;;   ROBOTIC ASSISTED SALPINGO OOPHERECTOMY Bilateral 03/30/2020   Procedure: XI ROBOTIC ASSISTED SALPINGO OOPHORECTOMY;  Surgeon: Lafonda Mosses, MD;  Location: [facility];  Service: Gynecology;  Laterality: Bilateral;   TONSILLECTOMY  0947   UMBILICAL HERNIA REPAIR  2001; 2005    There were no vitals filed for this visit.    Subjective Assessment - 04/04/21 1056     Subjective Pt presents with history of transverse myelitis with worsening weakness, gait, and ambulatory capacity. Pt is interested in powered mobility to help her get around her home, work and community environments to facilitate ADLs and participation in daily activities. She reports she is currently limited in her mobility due to weakness in her legs, painful muscle spasms, and abnormal sensations that she experiences d/t the transverse myelitis. She has not utilized a cane or walker previously because of her hand and arm weakness/pain.    Patient is accompained by: --   DME vendor representative:  Deberah Pelton, [employer]   Pertinent History transverse myelitis 2017, Breast cancer 2018 with lumpectomy and radiation, hx of MI and cardiac stents x2, COPD,  Depression    Limitations Walking;House hold activities    Patient Stated Goals to obtain a powered mobility device to help with functional mobility    Currently in Pain? Yes    Pain Score 7    Muscle spasms, pain along right arm and leg greater than the left               [facility] PT Assessment - 04/04/21 1920       Assessment   Medical Diagnosis R26.9 (ICD-10-CM) - Gait disturbance  G37.3 (ICD-10-CM) - Transverse myelitis ([place])    Referring Provider (PT) Esmond Harps Dominance Right    Next MD Visit September 2022 with Dr Felecia Shelling      Balance Screen   Has the patient fallen in the past 6 months Yes    How many times? 2    Has the patient had a decrease in activity level because of a fear of falling?  Yes    Is the patient reluctant to leave their home because of a fear of falling?  Yes      Sensation   Light Touch Impaired by gross assessment  Wheelchair evaluation was completed at today's visit.  Deberah Pelton, ATP from [employer] will be doing a home visit for further evaluation of appropriate equipment.Full letter of medical necessity will be." LOMN provided below and a copy will be scanned into patient's chart for reference.    Mobility/Seating Evaluation    PATIENT INFORMATION: Name: Anna H[facility] DOB: [birth year]  Sex: F Date seen: 04/04/2021 Time: 10:00 am  Address:  [place] [address] Physician: Marcial Pacas, MD/ Arlice Colt, MD This evaluation/justification form will serve as the LMN for the following suppliers: __________________________ Supplier: [employer] Contact Person: Deberah Pelton, Wess Botts Phone:  [phone]   Seating Therapist: Sherlynn Stalls Phone:   [phone]   Phone: [phone]     Spouse/Parent/Caregiver name: ----  Phone number: ---- Insurance/Payer: [facility]     Reason for Referral: Evaluation for Powered WC due to worsening mobility  Patient/Caregiver Goals: To be able to obtain a powered mobility device  to increase independence with getting around her home, work and community environments.    Patient was seen for face-to-face evaluation for new power wheelchair.  Also present was Deberah Pelton, ATP from [employer] to discuss recommendations and wheelchair options.  Further paperwork was completed and sent to vendor.  Patient appears to qualify for power mobility device at this time per objective findings.    MEDICAL HISTORY: Diagnosis: Primary Diagnosis: Transverse Myelitis Onset: January 2017 Diagnosis: HTN, CHF, Hyperlipidemia, COPD with supplemental oxygen as needed at home (used daily),  Incontinence post transverse myelitis onset, history of Breast Cancer     _0 Progressive Disease Relevant past and future surgeries:  right breast lumpectomy and radiation with a subsequent radiation burn in 2018. Prophylactic removal of fallopian tubes and ovaries. Cardiac stent placement 2015, coronary graft revascularization 2018 after MI.    Height: 5' 1.5" Weight: 150 lbs Explain recent changes or trends in weight: stable as of current   History including Falls: Pt presents with a 5 year history of transverse Myelitis (initially developing in january 2017). Patient's PMH is significant for heart attack, CHF, and coronary artery occlusion with stent procedures x 2, history of Breast Cancer for which she had lumpectomy and radiation treatment in 2018. Per patient, her Transverse myelitis symptoms began with the right side of neck into right shoulder affecting levels below C5-C7 of the spinal cord, subsequently impacting both arms and legs. Right arm is with significant weakness and abnormal sensation. Left arm is affected less than the right but with intermittent exacerbations of weakness. She experiences extreme weakness in bilateral lower extremities with abnormal sensations from the knee down.  There are days where symptoms are elevated to the point where she must take pain medications, with inability to walk and  place weight through feet because of pain and spasms. She reports a history of recurrent falls with 2 falls in the past 6 months with injury to right 3rd digit in addition to an injury on the left thumb. Aside from this, she does report frequent near falls daily. Pt also reports history of depression which worsens due to limitations in mobility when at home and at work. She is currently limited to walking short distances while holding close to furniture when negotiating inside of the home and reports inability to ambulate longer distances.        HOME ENVIRONMENT: _1 House  _2 Condo/town home  _3 Apartment  _4 Assisted Living    _5 Lives Alone _6  Lives with Others  Hours with caregiver:   _0 Home is accessible to patient           Stairs      _1 Yes _2  No     Ramp _3 Yes _4 No Comments:  Pt lives in a single level home with one small step to get into the front door. She reports she will frequently enter the home through her garage where it is more level. Otherwise within her home she reports needing to utilize furniture, external supports to stabilize herself. She does  live with her boyfriend but she is home alone frequently without assistance.     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 She states significant difficulty negotiating curbs, stairs and ramps where she requires increased time and by the time she has reached entrance, she has utilized so much energy that she begins to experience extreme  fatigue. Otherwise her job may require her to attend offsite meetings where she must negotiate community environments.   Other: Pt owns a [employer] and often drives alone.     FUNCTIONAL/SENSORY PROCESSING SKILLS:  Handedness:   _12 Right     _13 Left    _14 NA  Comments:    Functional Processing Skills for Wheeled Mobility _15 Processing Skills are adequate for safe wheelchair operation  Areas of concern than may interfere with safe operation of wheelchair Description of problem   _16  Attention to environment      _17 Judgment      _18  Hearing  _19  Vision or visual processing      _20 Motor Planning  _21  Fluctuations in Behavior  Sensory impairments due to Transverse Myelitis: Patient experiences numbness and tingling that begins back of the throat into her hands and knees down into feet. Also gets electrical shock type sensations down extremities (lhermite's sign).    VERBAL COMMUNICATION: _22 WFL receptive _23  WFL expressive _24 Understandable  _25 Difficult to understand  _26 non-communicative _27  Uses an augmented communication device  CURRENT SEATING / MOBILITY: Current Mobility Base:  _28 None _29 Dependent _30 Manual _31 Scooter _32 Power  Type of Control:   Manufacturer:  Size:  Age:   Current Condition of Mobility Base:     Current Wheelchair components:    Describe posture in present seating system:        SENSATION and SKIN ISSUES: Sensation _33 Intact  _34 Impaired _35 Absent  Level of sensation: Sensation is present bilaterally but is diminished to light touch along the right upper and lower extremities Pressure Relief: Able to perform effective pressure relief :    _36 Yes  _37  No Method:  If not, Why?:   Skin Issues/Skin Integrity Current Skin Issues  _38 Yes _39 No _40 Intact _41  Red area_42  Open Area  _43 Scar Tissue _44 At risk from prolonged sitting Where    History of Skin Issues  _45 Yes _46 No Where   When    Hx of skin flap surgeries  _47 Yes _48 No Where   When    Limited sitting tolerance _49 Yes  _50 No Hours spent sitting in wheelchair daily:   Complaint of Pain:  Please describe: 7/10 at the worst with muscle spasms/ cramps into the legs moreso  than arms. Pain can fluctuate and pt must utilize medications to help manage pain and symptoms.    Swelling/Edema: N/A   ADL STATUS (in reference to wheelchair use):  Indep Assist Unable Indep with Equip Not assessed Comments  Dressing _0  _1  _2  _3  _4  modified independence due to balance, mobility challenges and increased time needed to complete  Eating _5  _6  _7  _8  _9    Toileting _10  _11  _12  _13  _14  modified independence with UE support  and increased time to get to/from toilet and complete sit to stand transfers from toilet.    Bathing _15  _16  _17  _18  _19    Grooming/Hygiene _20  _21  _22  _23  _24    Meal Prep _25  _26  _27  _28  _29  modified independence requiring UE support with limited standing tolerance due to muscle weakness and pain from spasms.   IADLS <JOACZYSAYTKZSWFU>_9<\/NATFTDDUKGURKYHC>_62  _31  _32  _33  _34  modified independence with increased time required to complete and UE supports for Household IADLs. She states she must rely on delivery services for shopping needs such as grocieries 2/2 inability to ambulate long distances and for prolonged times.   Bowel Management: _35 Continent  _36 Incontinent  _37 Accidents Comments:    Bladder Management: _38 Continent  _39 Incontinent  _40 Accidents Comments:  Post transverse myelitis onset pt experiences Urinary urgency with some incontinence, currently managed using medications. 2/2 limited functional mobility it also takes her increased time to ambulate to bathroom.     WHEELCHAIR SKILLS: Manual w/c Propulsion: _41 UE or LE strength and endurance sufficient to participate in ADLs using manual wheelchair Arm : _42 left _43 right   _44 Both      Distance:  Foot:  _45 left _46 right   _47 Both  Operate Scooter: _48  Strength, hand grip, balance and transfer appropriate for use _49 Living environment is accessible for use of scooter  Operate Power w/c:  _50  Std. Joystick   _51   Alternative Controls Indep _52  Assist _53  Dependent/unable _54  N/A _55   _56 Safe          _57  Functional      Distance:   Bed confined without wheelchair _58  Yes _59  No   STRENGTH/RANGE OF MOTION:   Range of Motion Strength  Shoulder 125 degrees flexion bilaterally  active, limited by tightness and weakness.  3-/5 bilaterally   Elbow WFL full and symmetrical bilateral  3/5 bilaterally  Wrist/Hand L: 25 degrees, R: 20 degrees  Grip strength: L:12 lbs, R:9 lbs  Hip B AROM WFL 3+/5 B with pain and shakiness.   Knee B AROM WFL 3/5 on the right and 3+/5 on the left with pain and shaky quality of movement (right worse than left)  Ankle B WFL for DF and PF 3+/5 bilaterally for DF and PF, shaky quality of movement     MOBILITY/BALANCE:  _60  Patient is totally dependent for mobility      Balance Transfers Ambulation  Sitting Balance: Standing Balance: _61  Independent _62  Independent/Modified Independent  _63  WFL     _64  WFL _65  Supervision _66  Supervision  _67  Uses UE for balance  _68  Supervision - for static standing balance _69  Min Assist _70  Ambulates with Assist  leans onto support surfaces frequently for min assist with short distance ambulation    _71  Min Assist _72  Min assist _73  Mod Assist _74  Ambulates with Device:      _75  RW  _76  StW  _77  Cane  _78    _79  Mod Assist _80  Mod assist _81  Max assist   _82  Max Assist _83  Max assist _84  Dependent _85  Indep. Short Distance Only  _86  Unable _87  Unable _88   Lift / Sling Required Distance (in feet) 10 to 25 feet - modified independent  for short distances, unable to complete longer distances- requires UE support on on furniture/support surfaces to negotiate short distances, decreased functional gait speed and stability also increasing energy expenditure/decreasing efficiency of gait.    _0  Sliding board _1  Unable to Ambulate (see explanation below)  Cardio Status:  _2 Intact  _3  Impaired   _4  NA     Patient (Pt) with HTN, CHF, history of cardiac stents and MI. Pt with overall  decreased endurance and aerobic capacity which limits tolerance to walking longer distances, prolonged activities.   Respiratory Status:  _5 Intact   _6 Impaired   _7 NA     COPD, has supplemental oxygen at home to use as needed but does use it daily   Orthotics/Prosthetics: none  Comments (Address manual vs power w/c vs scooter): Patient currently requires supports such as leaning into support surfaces around her when ambulating. She is currently a falls risk as per her score on the TUG in 28.49 seconds without an assistive device with a very slow, unsteady and inefficient gait pattern with wide BOS. Knee instability in stance with circumducted, externally rotated LE positioning with swing.    Secondary to her impaired strength, sensation, and cardiopulmonary status, lesser assistive devices such as a cane or walker would not be safe or efficient to utilize. Patient is not suitable for a manual W/C due to diminished grip/UE strength and cardiovascular endurance needed for propulsion. A powered scooter may be too large and difficult to maneuver within Miguel's home. Pt would benefit from a power W/C with a joystick control which would offer increased independence with functional mobility to allow her to maneuver her home, community and work environments in an energy efficient and safe way, in addition to reducing risk of falls. Given the progressive nature of the patient's condition and impairments, a power W/C would also allow for any modifications needed as mobility becomes more limited in the future.           Anterior / Posterior Obliquity Rotation-Pelvis   PELVIS    _8  _9  _10   Neutral Posterior Anterior  _11  _12  _13   WFL Rt elev Lt elev  _14  _15  _16   WFL Right Left                      Anterior    Anterior     _17  Fixed _18  Other _19  Partly Flexible _20  Flexible   _21  Fixed _22  Other _23  Partly Flexible  _24  Flexible  _25  Fixed _26  Other _27  Partly Flexible  _28  Flexible   TRUNK  _29  _30  _31   [facility]   Thoracic  Lumbar  Kyphosis Lordosis  _32  _33  _34   WFL Convex Convex  Right Left _35 c-curve _36 s-curve _37 multiple  _38  Neutral _39  Left-anterior _40  Right-anterior     _41  Fixed _42  Flexible _43  Partly Flexible _44  Other  _45  Fixed _46  Flexible _47  Partly Flexible _48  Other  _49  Fixed             _50  Flexible _51  Partly Flexible _52  Other    Position Windswept    HIPS          _53            _54               _55    Neutral       Abduct        ADduct         [  x]          _0            _1   Neutral Right           Left      _2  Fixed _3  Subluxed _4  Partly Flexible _5  Dislocated _6  Flexible  _7  Fixed _8  Other _9  Partly Flexible  _10  Flexible                 Foot Positioning Knee Positioning      _11  WFL  _12 Lt _13 Rt _14  WFL  _15 Lt _16 Rt    KNEES ROM concerns: ROM concerns:    & Dorsi-Flexed _17 Lt _18 Rt     FEET Plantar Flexed _19 Lt _20 Rt      Inversion                 _21 Lt _22 Rt      Eversion                 _23 Lt _24 Rt     HEAD _25  Functional _26  Good Head Control    & _27  Flexed         _28  Extended _29  Adequate Head Control    NECK _30  Rotated  Lt  _31  Lat Flexed Lt _32  Rotated  Rt _33  Lat Flexed Rt _34  Limited Head Control     _35  Cervical Hyperextension _36  Absent  Head Control     SHOULDERS ELBOWS WRIST& HAND       Left     Right    Left     Right    Left     Right   U/E _37 Functional           _38 Functional   _39 Fisting             _40 Fisting      _41 elev   _42 dep      _43 elev   _44 dep       _45 pro -_46 retract     _47 pro  _48 retract _49 subluxed             _50 subluxed           Goals for Wheelchair Mobility  _51  Independence with mobility in the home with motor related ADLs (MRADLs)  _52  Independence with MRADLs in the community _53  Provide dependent mobility  _54  Provide recline     _55 Provide tilt   Goals for Seating system _56  Optimize pressure distribution _57  Provide support needed to facilitate function or safety _58  Provide corrective forces to assist with maintaining or improving posture _59   Accommodate client's posture:   current seated postures and positions are not flexible or will not tolerate corrective forces _60  Client to be independent with relieving pressure in the wheelchair _61 Enhance physiological function such as breathing, swallowing, digestion  Simulation ideas/Equipment trials: State why other equipment was unsuccessful:   MOBILITY BASE RECOMMENDATIONS and JUSTIFICATION: MOBILITY COMPONENT JUSTIFICATION  Manufacturer: Model:    Size: Width Seat Depth  _62 provide transport from point A to B      _63 promote Indep mobility  _64 is not a safe, functional ambulator _65 walker or cane inadequate _66 non-standard width/depth necessary to accommodate anatomical measurement _67    _68 Manual Mobility Base _69 non-functional ambulator    _70 Scooter/POV  _71 can safely operate  _72 can safely transfer   _73 has adequate trunk stability  _74 cannot functionally propel manual w/c  _75 Power Mobility Base  _76 non-ambulatory  _77 cannot functionally propel manual wheelchair  _78  cannot functionally and safely operate scooter/POV _79 can safely operate and willing to  _80 Stroller Base _81 infant/child  _82   unable to propel manual wheelchair _0 allows for growth _1 non-functional ambulator _2 non-functional UE _3 Indep mobility is not a goal at this time  _4 Tilt  _5 Forward _6 Backward _7 Powered tilt  _8 Manual tilt  _9 change position against gravitational force on head and shoulders  _10 change position for pressure relief/cannot weight shift _11 transfers  _12 management of tone _13 rest periods _14 control edema _15 facilitate postural control  _16    _17 Recline  _18 Power recline on power base _19 Manual recline on manual base  _20 accommodate femur to back angle  _21 bring to full recline for ADL care  _22 change position for pressure relief/cannot weight shift _23 rest periods _24 repositioning for transfers or clothing/diaper /catheter changes _25 head positioning  _26 Lighter weight required _27 self- propulsion  _28 lifting  _29    _30 Heavy Duty required _31 user weight greater than 250# _32 extreme tone/ over active movement _33 broken frame on previous chair _34    _35  Back  _36  Angle Adjustable _37  Custom molded  _38 postural control _39 control of tone/spasticity _40 accommodation of range of motion _41 UE functional control _42 accommodation for seating system _43   _44 provide lateral trunk support _45 accommodate deformity _46 provide posterior trunk support _47 provide lumbar/sacral support _48 support trunk in midline _49 Pressure relief over spinal processes  _50  Seat Cushion  _51 impaired sensation  _52 decubitus ulcers present _53 history of pressure ulceration _54 prevent pelvic extension _55 low maintenance  _56 stabilize pelvis  _57 accommodate obliquity _58 accommodate multiple deformity _59 neutralize lower extremity position _60 increase pressure distribution _61    _62  Pelvic/thigh support  _63  Lateral thigh guide _64  Distal medial pad  _65  Distal lateral pad _66  pelvis in neutral _67 accommodate pelvis _68  position upper legs _69  alignment _70  accommodate ROM _71  decr adduction _72 accommodate tone _73 removable for transfers _74 decr abduction  _75  Lateral trunk Supports _76  Lt     _77  Rt _78 decrease lateral trunk leaning _79 control tone _80 contour for increased contact _81 safety  _82 accommodate asymmetry _83    _84  Mounting hardware  _85 lateral trunk supports  _86 back   _87 seat _88 headrest      _89  thigh support _90 fixed   _91 swing away _92 attach seat platform/cushion to w/c frame _93 attach back cushion to w/c frame _94 mount postural supports _95 mount headrest  _96 swing medial thigh support away _97 swing lateral supports away for transfers  _98      Armrests  _99 fixed _100 adjustable height _101 removable   _102 swing away  _103 flip back   _104 reclining _105 full length pads _106 desk    _107 pads tubular  _108 provide support with elbow at 90   _109 provide support for w/c tray _110 change of height/angles for variable activities _111 remove for transfers _112 allow to come closer to table  top _113 remove for access to tables _114    Hangers/ Leg rests  _115 60 _116 70 _117 90 _118 elevating _119 heavy duty  _120 articulating _121 fixed _122 lift off _123 swing away     _124 power _125 provide LE support  _126 accommodate to hamstring tightness _127 elevate legs during recline   _128 provide change in position for Legs _129 Maintain placement of feet on footplate _130 durability _131 enable transfers _132 decrease edema _133 Accommodate lower leg length _134    Foot support Footplate    <NWGNFAOZHYQMVHQI>_6<\/NGEXBMWUXLKGMWNU>_272 Lt  _136  Rt  _137  Henson mount _138 flip up     _139 depth/angle adjustable _140 Amputee adapter    _141  Lt     _142  Rt _143 provide foot support _144 accommodate to ankle ROM _145 transfers _146 Provide support for residual extremity _147  allow foot to go under wheelchair base _148  decrease tone  _149    _150  Ankle strap/heel loops _151 support foot on foot support _152 decrease extraneous movement _153 provide input to heel  _154 protect foot  Tires: _155 pneumatic  _156 flat free inserts  _157 solid  _158 decrease maintenance  _159 prevent frequent flats _160 increase shock absorbency _161 decrease pain from road shock _162 decrease spasms from road  shock _0    _1  Headrest  _2 provide posterior head support _3 provide posterior neck support _4 provide lateral head support _5 provide anterior head support _6 support during tilt and recline _7 improve feeding   _8 improve respiration _9 placement of switches _10 safety  _11 accommodate ROM  _12 accommodate tone _13 improve visual orientation  _14  Anterior chest strap _15  Vest _16  Shoulder retractors  _17 decrease forward movement of shoulder _18 accommodation of TLSO _19 decrease forward movement of trunk _20 decrease shoulder elevation _21 added abdominal support _22 alignment _23 assistance with shoulder control  _24    Pelvic Positioner _25 Belt _26 SubASIS bar _27 Dual Pull _28 stabilize tone _29 decrease falling out of chair/ **will not Decr potential for sliding due to pelvic tilting _30 prevent excessive rotation _31 pad for protection over boney prominence _32 prominence  comfort _33 special pull angle to control rotation _34    Upper Extremity Support _35 L   _36  R _37 Arm trough    _38 hand support _39  tray       _40 full tray _41 swivel mount _42 decrease edema      _43 decrease subluxation   _44 control tone   _45 placement for AAC/Computer/EADL _46 decrease gravitational pull on shoulders _47 provide midline positioning _48 provide support to increase UE function _49 provide hand support in natural position _50 provide work surface   POWER WHEELCHAIR CONTROLS  _51 Proportional  _52 Non-Proportional Type  _53 Left  _54 Right _55 provides access for controlling wheelchair   _56 lacks motor control to operate proportional drive control <GNFAOZHYQMVHQION>_6<\/EXBMWUXLKGMWNUUV>_25 unable to understand proportional controls  Actuator Control Module  _58 Single  _59 Multiple   _60 Allow the client to operate the power seat function(s) through the joystick control   _61 Safety Reset Switches _62 Used to change modes and stop the wheelchair when driving in latch mode    _63 [employer]   _64 programming for accurate control _65 progressive Disease/changing condition _66 non-proportional drive control needed _67 Needed in order to operate power seat functions through joystick control   _68 Display box _69 Allows user to see in which mode and drive the wheelchair is set  _70 necessary for alternate controls    _71 Digital interface electronics _72 Allows w/c to operate when using alternative drive controls  <DGUYQIHKVQQVZDGL>_8<\/VFIEPPIRJJOACZYS>_06 ASL Head Array _74 Allows client to operate wheelchair  through switches placed in tri-panel headrest  _75 Sip and puff with tubing kit _76 needed to operate sip and puff drive controls  <TKZSWFUXNATFTDDU>_2<\/GURKYHCWCBJSEGBT>_51 Upgraded tracking electronics _78 increase safety when driving <VOHYWVPXTGGYIRSW>_5<\/IOEVOJJKKXFGHWEX>_93 correct tracking when on uneven surfaces  _80 [facility] for switches or joystick _81 Attaches switches to w/c  _82 Swing away for access or transfers _83 midline for optimal placement _84 provides for consistent access  _85 Attendant controlled joystick plus mount _86 safety _87 long distance driving <ZJIRCVELFYBOFBPZ>_0<\/CHENIDPOEUMPNTIR>_44 operation of seat  functions _89 compliance with transportation regulations _90      Rear wheel placement/Axle adjustability _91 None _92 semi adjustable _93 fully adjustable  _94 improved UE access to wheels _95 improved stability _96 changing angle in space for improvement of postural stability _97 1-arm drive access <RXVQMGQQPYPPJKDT>_2<\/IZTIWPYKDXIPJASN>_05 amputee pad placement _99    Wheel rims/ hand rims  _100 metal  _101 plastic coated _102 oblique projections _103 vertical projections _104 Provide ability to propel manual wheelchair  _105  Increase self-propulsion with hand weakness/decreased grasp  Push handles _106 extended  _107 angle adjustable  _108 standard _109 caregiver access _110 caregiver assist _111 allows "hooking" to enable increased ability to perform ADLs or maintain balance  One armed device  _112 Lt   _113 Rt _114 enable propulsion of manual wheelchair with one arm   _115      Brake/wheel lock extension _116  Lt   _117  Rt _118 increase indep in applying wheel locks   _119 Side guards _120 prevent clothing getting caught in wheel or becoming soiled _121  prevent skin tears/abrasions  Battery:  _122 to power wheelchair   Other:     The above equipment has a life- long use expectancy. Growth  and changes in medical and/or functional conditions would be the exceptions. This is to certify that the therapist has no financial relationship with durable medical provider or manufacturer. The therapist will not receive remuneration of any kind for the equipment recommended in this evaluation.   Patient has mobility limitation that significantly impairs safe, timely participation in one or more mobility related ADL's.  (bathing, toileting, feeding, dressing, grooming, moving from room to room)                                                             _0  Yes _1  No Will mobility device sufficiently improve ability to participate and/or be aided in participation of MRADL's?         _2  Yes _3  No Can limitation be compensated for with use of a cane or walker?                                                                                 _4  Yes _5  No Does patient or caregiver demonstrate ability/potential ability & willingness to safely use the mobility device?   _6  Yes _7  No Does patient's home environment support use of recommended mobility device?                                                    _8  Yes _9  No Does patient have sufficient upper extremity function necessary to functionally propel a manual wheelchair?    _10  Yes _11  No Does patient have sufficient strength and trunk stability to safely operate a POV (scooter)?                                  _12  Yes _13  No Does patient need additional features/benefits provided by a power wheelchair for MRADL's in the home?       _14  Yes _15  No Does the patient demonstrate the ability to safely use a power wheelchair?                                                              _16  Yes _17  No  Therapist Name Printed: Hall Busing, PT, DPT    Therapist's Signature:   Date: 04/04/2021  Supplier's Name Printed: [employer] Date: 04/04/2021  Supplier's Signature:   Date:  Patient/Caregiver Signature:   Date:      This is to certify that I have read this evaluation and do agree with the content within:   Physician's Name Printed: Marcial Pacas, MD (referring physician)/ Arlice Colt, MD (primary neurologist)  Physician's Signature:  Date:     This is to certify that I, the above signed therapist have the following affiliations: _0  This DME provider _1  Manufacturer of recommended equipment _2  Patient's long term care facility _3  None of the above      Objective measurements completed on examination: See above findings.               PT Education - 04/04/21 1421     Education Details Pt educated on process for obtaining a powered W/C, need for face-to - face with MD, powered W/C recommendations. Next steps discussed with PT and DME vendor    Person(s) Educated Patient    Methods Explanation    Comprehension Verbalized  understanding                         Plan - 04/04/21 1428     Clinical Impression Statement Pt is a 50 y.o. 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 PMH also includes extensive cardiac history including cardiac stent placement in 2015, NSTEMI/Heart attack in 2018 with coronary graft revascularization,  history of Breast Cancer for which she had lumpectomy and radiation treatment in 2018, COPD for which she uses supplemental oxygen daily as needed, hypertension, and depression. Pt reports history of depression which worsens due to limitations in mobility when at home and at work. She currently works from home 3 days per week and goes into the office other days. There are days where symptoms are elevated to the point where she must take pain medications, with inability to walk and place weight through feet because of pain and spasms. She reports a history of recurrent falls with 2 falls in the past 6 months with injury to right 3rd digit in addition to an injury on the left thumb. Aside from this, she does report frequent near falls daily with LOB during transfers into standing and with walking.    PT evaluation revealed the following:  impaired sensation in BUE and BLE with right side more involved than the left, diminished UE and LE AROM bilaterally, diminished grip strength, decreased standing balance and abnormal gait. As a result, she currently requires supports such as leaning into support surfaces around her when ambulating. She is currently a falls risk as per her score on the TUG in 28.49 seconds without an assistive device with a very slow, unsteady and inefficient gait pattern with wide BOS. Knee instability in stance with circumducted, externally rotated LE  positioning with swing.      Secondary to her impaired strength, sensation, and cardiopulmonary status, lesser assistive devices such as a cane or walker would not be safe or efficient to utilize. Patient is not suitable for a manual W/C  due to diminished grip/UE strength and cardiovascular endurance for propulsion. A powered scooter may be too large and difficult to maneuver within [facility]'s home. Pt would benefit from a power W/C with a joystick control which would offer increased independence with mobility to allow [facility] to maneuver her home, community and work environments in an energy efficient and safe way.    Personal Factors and Comorbidities Past/Current Experience;Comorbidity 3+;Time since onset of injury/illness/exacerbation    Examination-Activity Limitations Locomotion Level    Examination-Participation Restrictions Meal Prep;Occupation;Shop;Community Activity    Stability/Clinical Decision Making  Evolving/Moderate complexity    Clinical Decision Making Moderate    PT Frequency One time visit   W/C evaluation   Consulted and Agree with Plan of Care Patient              Wheelchair evaluation was completed at today's visit.  Harrie Jeans, ATP from [employer] will be doing a home visit for further evaluation of appropriate equipment.Full letter of medical necessity will be." LOMN provided below and a copy will be scanned into patient's chart for reference.     Patient will benefit from skilled therapeutic intervention in order to improve the following deficits and impairments:  Difficulty walking, Impaired UE functional use, Increased muscle spasms, Pain, Decreased balance, Decreased mobility, Decreased strength, Impaired sensation  Visit Diagnosis: Transverse myelitis (HCC) - Plan: PT plan of care cert/re-cert  Other abnormalities of gait and mobility - Plan: PT plan of care cert/re-cert  Muscle weakness (generalized) - Plan: PT plan of care cert/re-cert  Unspecified  disturbances of skin sensation - Plan: PT plan of care cert/re-cert  History of falling - Plan: PT plan of care cert/re-cert     Problem List Patient Active Problem List   Diagnosis Date Noted   Hyponatremia 10/14/2020   Chronic diastolic CHF (congestive heart failure) (HCC) 10/08/2020   Neuropathic pain 07/06/2020   Cervical spinal stenosis 06/15/2020   Lhermitte's sign positive 06/15/2020   Labial cyst    Complex cyst of right ovary 12/05/2019   Hypertensive urgency 07/28/2018   Chronic anemia    MDD (major depressive disorder), recurrent episode, severe (HCC) 08/28/2017   Neck stiffness 08/22/2017   Abnormal finding on MRI of brain 08/22/2017   HOCM (hypertrophic obstructive cardiomyopathy) (HCC)    Chest pain 05/06/2017   Genetic testing 04/23/2017   Malignant neoplasm of lower-outer quadrant of right breast of female, estrogen receptor positive (HCC) 03/29/2017   Dysesthesia 03/12/2017   Palpitations 02/23/2017   S/P cardiac cath (12/2016) a. acute total occlusion of the RCA tx w/PCI DES, b. mild nonobs LAD/LCx stenosis, c. 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   Acute-on-chronic kidney injury (HCC) 11/12/2015   Neck pain 11/12/2015   Hypokalemia 11/12/2015   Abnormal MRI, neck 11/12/2015   Numbness 11/12/2015   Hypertensive crisis    Hyperlipidemia    Coronary artery disease involving native coronary artery of native heart without angina pectoris    Facial numbness    Right arm numbness    Throat pain 11/09/2015   Drooling 11/09/2015   CAD (coronary artery disease), native coronary artery 09/15/2014   Headache  09/15/2014   UTI (urinary tract infection): Probable 09/14/2014   Candida infection of genital region 09/14/2014   Unstable angina (HCC) 09/13/2014   HLD (hyperlipidemia) 09/13/2014   Otitis 09/13/2014   ACS (acute coronary syndrome) (HCC) 09/13/2014   Tobacco abuse 09/13/2014   Essential hypertension 09/09/2014    Anson Crofts, PT, DPT 04/05/2021, 5:46 PM  [facility] [address] [facility] [address] [place], [place], [address] Phone: [phone]   Fax:  [phone]  Name: Chiann Goffredo MRN: [id number] Date of Birth: [birth year]

## 2021-04-04 NOTE — Telephone Encounter (Signed)
I received fax requesting notes supporting need for Power wheelchair.

## 2021-04-04 NOTE — Telephone Encounter (Signed)
Called pt. Scheduled appt for 04/12/21 at 1:30pm w/ Dr. Felecia Shelling. Asked she check in by 1:00pm. She is aware she needs updated visit with MD before sending notes to [employer].

## 2021-04-12 ENCOUNTER — Encounter: Payer: Self-pay | Admitting: Neurology

## 2021-04-12 ENCOUNTER — Ambulatory Visit (INDEPENDENT_AMBULATORY_CARE_PROVIDER_SITE_OTHER): Payer: 59 | Admitting: Neurology

## 2021-04-12 VITALS — BP 107/70 | HR 74 | Ht 61.5 in | Wt 145.0 lb

## 2021-04-12 DIAGNOSIS — G373 Acute transverse myelitis in demyelinating disease of central nervous system: Secondary | ICD-10-CM | POA: Diagnosis not present

## 2021-04-12 DIAGNOSIS — R269 Unspecified abnormalities of gait and mobility: Secondary | ICD-10-CM

## 2021-04-12 DIAGNOSIS — R208 Other disturbances of skin sensation: Secondary | ICD-10-CM

## 2021-04-12 DIAGNOSIS — R29818 Other symptoms and signs involving the nervous system: Secondary | ICD-10-CM | POA: Diagnosis not present

## 2021-04-12 DIAGNOSIS — Z79899 Other long term (current) drug therapy: Secondary | ICD-10-CM

## 2021-04-12 MED ORDER — BACLOFEN 10 MG PO TABS: ORAL_TABLET | ORAL | 11 refills | Status: DC

## 2021-04-12 MED ORDER — OXCARBAZEPINE 300 MG PO TABS: ORAL_TABLET | ORAL | 11 refills | Status: DC

## 2021-04-12 NOTE — Progress Notes (Addendum)
[facility]  PATIENT: Anna Henson DOB: 1971-08-14  REFERRING DOCTOR OR PCP:  Rikki Spearing _________________________________   HISTORICAL  CHIEF COMPLAINT:  Chief Complaint  Patient presents with   Follow-up    Pt alone, rm 1. Presents today to discuss getting a power wheelchair.     HISTORY OF PRESENT ILLNESS:  Anna Henson is a 50 yo woman with a transverse myelitis in late January 2017.    Update7/02/2021 Anna Henson had transverse myelitis in 2017.  Although she had some improvement of strength, sensation and mobility, she continues to have difficulty with her gait.  Mobility needs: She is having difficulty with activities of daily living including going from room to room for self-care activities of daily living including going to the bathroom, meal preparation and simple chores.  Current mobility: She has reduced gait due to weakness, spasticity and ataxia.  Due to the extent of weakness, ataxia and spasticity, and the weakness and pain in the arms she cannot use a cane or walker safely to meet her mobility needs.  She would be at increased risk of falling with these.  Due to the weakness and pain in the arms, she cannot adequately self propel.  Therefore, she needs a power wheelchair.  A scooter would be difficult for her to operate in her home due to the turning radius and would be more difficult for her to get in and out of.  Current symptoms: She continues to have poor ability to ambulate due to leg weakness, spasticity and ataxia.  She also has some weakness and pain in the arms, right greater than left.   She is experiencing pain in the neck into the right > left shoulder and down her back into the ribs.   Sensation is electric like.  She also has pain in her legs that radiates from her thighs down.    Legs feells weighted down at times, especially if walking longer distance.   Medications (oxcarbarbazepine 300 mg -150 mg- 450 mg qHS, gabapentin, tramadol and  etodolac) do help some but pain still flares up at times, especially if walking longer distance.    Baclofen 2-4 times a dayhelps the spasticity some.    MRI 12/2019 showed resolution of the cervical spine lesion seen previously (we discussed that despite no lesion now on MRI a scar could still be left behind).    She had a breast biopsy that showed calcification but no cancer.      Transverse myelitis history: Transverse myelitis history:    In late January 2017,  she had a right sided headache and went to bed.  When she woke up, she noted numbness in the right neck, shoulder and arm.   Numbness worsened over the next fe days.   She went to her PCP an was told she might have sinusitis and Amoxicillin was prescribed.   After a couple more days, she saw another doctor and had a neck soft tissue CT scan.   That CT scan showed that she had an increase of adenoid tissue and reactive type lymph nodes.  By that time, numbness extended to the fingertips on the riht but not into her leg.   She was told to go to the emergency room for further evaluation. In the emergency room she had an MRI of the cervical spine and the brain. The MRI of the cervical spine showed a focus that extended from the lower medulla on the right down to C5 posteriorly and more  medially within the spinal cord. She also had some enhancement around the level of the cervical medullary junction. She was admitted to the hospital for further treatment and testing.    She was treated with 5 days of IV Solu-Medrol. During that time, her symptoms of numbness improved.  She has not returned to baseline.    She had many tests performed including lumbar puncture for CSF analysis and a thoracic spine MRI and serology blood tests. For the most part, the evaluation was negative.  The 02/02/16 cervical spine shows resolution of the prior enhancing focus that was seen at the cervicomedullary junction.    Data:    MRI  has a longitudinal lesion extending from  the lower medulla on the right down to C5/C6 on MRI January 2017.  The enhancement does not extend up to the medulla.  Cervical spine also showed mild spinal stenosis at C3-C4 and mild to moderate spinal stenosis at C5-C6 and mild spinal stenosis at C6-C7. There is some foraminal narrowing at those levels but no definite nerve root compression.  Elsewhere the brain, there were some subcortical and deep white matter T2/FLAIR hyperintense foci but not in a pattern that would be typical for MS. The thoracic spine was normal.    I also reviewed her many laboratory tests performed while she was at [facility]. Neuromyelitis optica antibodies are negative. She did not have oligoclonal bands. ANCA, ANA, SSA/SSB, ACE and Vit B-12 were all normal or negative.  She had Epstein-Barr IgG but not IgM. CSF cell counts did not show increase in white blood cells.  The 02/02/16 cervical spine MRI shows resolution of the prior enhancing focus that was seen at the cervicomedullary/upper cervical spine  .    December 24, 2019 MRI of the cervical spine is normal (MRI February 2017 has shown T2 hyperintensity from the medulla to the mid cervical spine) she does.  She does have degenerative changes.  There is moderate spinal stenosis at C5-C6 with moderate right foraminal narrowing.  At C6-C7, she has left-sided disc osteophyte complex that could affect the left C7 nerve root.  December 24, 2019 MRI of the thoracic spine showed a normal spinal cord and no significant degenerative changes.   REVIEW OF SYSTEMS: Constitutional: No fevers, chills, sweats, or change in appetite.  She notes some fatigue Eyes: No visual changes, double vision, eye pain Ear, nose and throat: No hearing loss, ear pain, nasal congestion, sore throat Cardiovascular: No chest pain, palpitations.   H/O angina, CAD with stent Respiratory:  No shortness of breath at rest or with exertion.   No wheezes GastrointestinaI: No nausea, vomiting, diarrhea, abdominal  pain, fecal incontinence Genitourinary:  Mild urgency and frequency.  No nocturia. Musculoskeletal:  No neck pain, back pain Integumentary: No rash, pruritus, skin lesions Neurological: as above Psychiatric: No depression at this time.  No anxiety Endocrine: No palpitations, diaphoresis, change in appetite, change in weigh or increased thirst Hematologic/Lymphatic:  No anemia, purpura, petechiae. Allergic/Immunologic: No itchy/runny eyes, nasal congestion, recent allergic reactions, rashes  ALLERGIES: Allergies  Allergen Reactions   Pork-Derived Products Other (See Comments)    Does NOT eat pork    HOME MEDICATIONS:  Current Outpatient Medications:    amLODipine (NORVASC) 10 MG tablet, TAKE 1 TABLET(10 MG) BY MOUTH DAILY (Patient taking differently: Take 10 mg by mouth daily.), Disp: 90 tablet, Rfl: 2   anastrozole (ARIMIDEX) 1 MG tablet, Take 1 tablet (1 mg total) by mouth daily., Disp: 90 tablet, Rfl: 3  aspirin 81 MG chewable tablet, Chew 1 tablet (81 mg total) by mouth daily., Disp: , Rfl:    atorvastatin (LIPITOR) 80 MG tablet, TAKE 1 TABLET(80 MG) BY MOUTH DAILY, Disp: 30 tablet, Rfl: 1   etodolac (LODINE) 400 MG tablet, Take 1 tablet (400 mg total) by mouth 2 (two) times daily., Disp: 60 tablet, Rfl: 11   fluticasone (FLONASE) 50 MCG/ACT nasal spray, Place 1-2 sprays into both nostrils daily as needed for allergies or rhinitis., Disp: , Rfl:    furosemide (LASIX) 20 MG tablet, Take 1 tablet (20 mg total) by mouth daily as needed (fluid retention.). (Patient taking differently: Take 20 mg by mouth daily as needed for fluid (fluid retention.).), Disp: 90 tablet, Rfl: 0   gabapentin (NEURONTIN) 300 MG capsule, Take $RemoveBefor'900mg'YPXHvemTCWOC$  (3 capsules) by mouth three times daily (Patient taking differently: Take 900 mg by mouth 3 (three) times daily.), Disp: 270 capsule, Rfl: 5   Isopropyl Alcohol (SWIMMERS EAR DROPS OT), Place 2 drops into both ears daily as needed (water in ears)., Disp: , Rfl:     lisinopril (ZESTRIL) 40 MG tablet, Take 1 tablet (40 mg total) by mouth daily., Disp: 90 tablet, Rfl: 3   methocarbamol (ROBAXIN) 500 MG tablet, TAKE 1 TABLET 3 TIMES A DAY AS NEEDED, Disp: 90 tablet, Rfl: 1   nitroGLYCERIN (NITROSTAT) 0.4 MG SL tablet, PLACE 1 TABLET UNDER THE TONGUE EVERY 5 MINUTES AS NEEDED FOR CHEST PAIN, AS DIRECTED, Disp: 25 tablet, Rfl: 4   oxybutynin (DITROPAN XL) 10 MG 24 hr tablet, Take 1 tablet (10 mg total) by mouth at bedtime., Disp: 30 tablet, Rfl: 5   Probiotic Product (PROBIOTIC-10 PO), Take 1 capsule by mouth daily., Disp: , Rfl:    spironolactone (ALDACTONE) 50 MG tablet, Take 1 tablet each morning and 1/2 tablet each evening., Disp: 135 tablet, Rfl: 1   traMADol (ULTRAM) 50 MG tablet, TAKE 1 TABLET(50 MG) BY MOUTH TWICE DAILY AS NEEDED, Disp: 60 tablet, Rfl: 5   venlafaxine XR (EFFEXOR-XR) 75 MG 24 hr capsule, TAKE 1 CAPSULE(75 MG) BY MOUTH DAILY WITH BREAKFAST (Patient taking differently: Take 75 mg by mouth daily with breakfast.), Disp: 90 capsule, Rfl: 3   baclofen (LIORESAL) 10 MG tablet, One po qid, Disp: 120 tablet, Rfl: 11   metoprolol tartrate (LOPRESSOR) 50 MG tablet, Take 1 tablet (50 mg total) by mouth 2 (two) times daily., Disp: 60 tablet, Rfl: 2   Oxcarbazepine (TRILEPTAL) 300 MG tablet, Take one pill qAM, one pill po qPM and two pills po qHS, Disp: 120 tablet, Rfl: 11  PAST MEDICAL HISTORY: Past Medical History:  Diagnosis Date   Anemia    Anxiety    Bipolar disorder in full remission ([place])    CAD in native artery cardiologist--  dr hilty   a. 09/2014 Cath/PCI in setting of [place] - s/p 2.25 x 12 mm Promus Premier DES to mid RCA;  b. 11/2016 Myoview: EF 56%, no ischemia;  c. 12/2016 NSTEMI/PCI: LM nl, LAd 25p, LCX 30p, RCA 100p (2.75x38 Promus Premier DES), mRCA 10 ISR, EF 50-55%.   CKD (chronic kidney disease), stage III ([place])    Depression    Genetic testing 04/23/2017   Anna Henson underwent genetic counseling and testing for hereditary cancer  syndromes on 04/05/2017. Her results were negative for mutations in all 46 genes analyzed by [facility]'s 46-gene Common Hereditary Cancers Panel. Genes analyzed include: APC, ATM, AXIN2, BARD1, BMPR1A, BRCA1, BRCA2, BRIP1, CDH1, CDKN2A, CHEK2, CTNNA1, DICER1, EPCAM, GREM1, HOXB13, KIT,  MEN1, MLH1, MSH2, MSH3, MSH6, MUTYH, NBN,   GERD (gastroesophageal reflux disease)    Headache    History of external beam radiation therapy    right breast 07-27-2017  to 09-25-2017   History of non-ST elevation myocardial infarction (NSTEMI)    HOCM (hypertrophic obstructive cardiomyopathy) ([place]) followed by cardiology   a. 12/2016 Echo: EF 65-70%,  mod conc LVH, dynamic obstruction @ rest, peak velocity of 291 cm/sec w/ peak gradient of 60mmHg, no rwma, Gr1 DD, triv TR, PASP 28mmHg.   Hyperlipidemia    Hypertension    Malignant neoplasm of lower-outer quadrant of right breast of female, estrogen receptor positive [facility]) oncologist--- dr Lindi Adie   dx 06/ 2018--- right breast invasive lobular carcinoma, ductal carcinoma w/ LCIS---- 06-21-2017 s/p right breast lumpecotmy w/ sln disseciton;   completed radiation 09-25-2017;  on tamoxifen   Myocardial infarction [facility])    2017   S/P drug eluting coronary stent placement    09-14-2014---  PCI with DES x1 to midRCA;  12-31-2016  PCI with DES x1 to proxRCA   Transverse myelitis [facility]) neurologist--- dr Felecia Shelling   01/ 2017  dx transverse myelitis w/ right side numbness (09-01-2019  currently lower extremity weakness, mucsle spasms, and gait disturbance)   Wears glasses     PAST SURGICAL HISTORY: Past Surgical History:  Procedure Laterality Date   BREAST LUMPECTOMY WITH RADIOACTIVE SEED AND SENTINEL LYMPH NODE BIOPSY Right 06/21/2017   Procedure: RIGHT BREAST BRACKETED SEED GUIDED LUMPECTOMY AND SENTINEL LYMPH NODE BIOPSY;  Surgeon: Jovita Kussmaul, MD;  Location: [place];  Service: General;  Laterality: Right;   CORONARY/GRAFT ACUTE MI REVASCULARIZATION N/A 12/31/2016    Procedure: Coronary/Graft Acute MI Revascularization;  Surgeon: Sherren Mocha, MD;  Location: [place] CV LAB;  Service: Cardiovascular;  Laterality: N/A;   DILATATION & CURETTAGE/HYSTEROSCOPY WITH MYOSURE N/A 09/02/2019   Procedure: DILATATION & CURETTAGE/HYSTEROSCOPY WITH MYOSURE;  Surgeon: Aloha Gell, MD;  Location: [place];  Service: Gynecology;  Laterality: N/A;   HERNIA REPAIR     LEFT HEART CATH AND CORONARY ANGIOGRAPHY N/A 12/31/2016   Procedure: Left Heart Cath and Coronary Angiography;  Surgeon: Sherren Mocha, MD;  Location: [place] CV [facility];  Service: Cardiovascular;  Laterality: N/A;   LEFT HEART CATHETERIZATION WITH CORONARY ANGIOGRAM N/A 09/14/2014   Procedure: LEFT HEART CATHETERIZATION WITH CORONARY ANGIOGRAM;  Surgeon: Burnell Blanks, MD;  Location[facility] C[facility] LAB;  Service: Cardiovascular;  Laterality: N/A;   MASS EXCISION N/A 03/30/2020   Procedure: EXCISION MASS RIGHT LABIA MINORA;  Surgeon: Lafonda Mosses, MD;  Location: [facility];  Service: Gynecology;  Laterality: N/A;   PERCUTANEOUS CORONARY STENT INTERVENTION (PCI-S)  09/14/2014   Procedure: PERCUTANEOUS CORONARY STENT INTERVENTION (PCI-S);  Surgeon: Burnell Blanks, MD;  Location[facility] C[facility] LAB;  Service: Cardiovascular;;   ROBOTIC ASSISTED SALPINGO OOPHERECTOMY Bilateral 03/30/2020   Procedure: XI ROBOTIC ASSISTED SALPINGO OOPHORECTOMY;  Surgeon: Lafonda Mosses, MD;  Location: [facility];  Service: Gynecology;  Laterality: Bilateral;   TONSILLECTOMY  [id number]   UMBILICAL HERNIA REPAIR  2001; 2005    FAMILY HISTORY: Family History  Problem Relation Age of Onset   Diabetes Mother    Heart disease Mother    Hyperlipidemia Mother    Hypertension Mother    Breast cancer Mother    Lung cancer Father    Drug abuse Father    Brain cancer Father 110   Bone cancer Father 83   Drug abuse Sister    Mental illness  Brother    Mental illness Maternal Grandmother    Stomach cancer Paternal  Grandmother 27       d.75      DIAGNOSTIC DATA (LABS, IMAGING, TESTING) - I reviewed patient records, labs, notes, testing and imaging myself where available.  Lab Results  Component Value Date   WBC 10.6 (H) 12/01/2020   HGB 15.0 12/01/2020   HCT 44.6 12/01/2020   MCV 93.7 12/01/2020   PLT 328 12/01/2020   +__________________________________________________________   Today's Vitals   04/12/21 1318  BP: 107/70  Pulse: 74  Weight: 145 lb (65.8 kg)  Height: 5' 1.5" (1.562 m)   Body mass index is 26.95 kg/m.  General: The patient is well-developed and well-nourished and in no acute distress   Neurologic Exam   Mental status: The patient is alert and oriented x 3 at the time of the examination. The patient has apparent normal recent and remote memory, with an apparently normal attention span and concentration ability.   Speech is normal.   Cranial nerves: Extraocular movements are full.   Facial strength and sensation is normal. Trapezius strength is normal    No obvious hearing deficits are noted.   Motor:  Muscle bulk is normal.  Tone is increased in legs, right greater than left. Strength is 5/5.   Sensory: Touch and vibration are symmetric in arms but reduced left leg vibration sensation..      Gait and station: She needs to use her arms to stand up from a chair..   Gait is spastic (right) and wide.  It is unstable.  Cannot do a tandem walk.       Reflexes: Deep tendon reflexes are symmetric and increased in legs, right greater than left. There is spread at the knees.   No ankle clonus today.   ________________________________________________________   Transverse myelitis ([place]) - Plan: Comprehensive metabolic panel  Gait disturbance  Dysesthesia  Lhermitte's sign positive  High risk medication use    1.    Due to sequela of the transverse myelitis including leg weakness, spasticity and ataxia and right arm weakness and pain, she needs a power wheelchair  (details above).  She has the ability to safely operate this Her device and is motivated to do so.   2.    For symptoms:  oxcarbazepine 1200/day in split dose.  Continue gabapentin, baclofen and tramadol.  Will need repeat MRI cervical spine and brain sometime in 2023 to assess for progression that would be worrisome for MS.   3.  She also has cervical DJD and will continue etodoal 4.   Stay active and exercise as tolerated 5.   rtc 6 months, sooner if new or worsening issues  42-minute office visit with the majority of the time spent face-to-face for history and physical, discussion/counseling and decision-making.  Additional time with record review and documentation.    Shylo Zamor A. Felecia Shelling, MD, PhD 02/11/2079, 2:23 PM Certified in Neurology, Clinical Neurophysiology, Sleep Medicine, Pain Medicine and Neuroimaging  [facility] [address] [place], [place] [address] [phone]

## 2021-04-13 ENCOUNTER — Other Ambulatory Visit: Payer: Self-pay | Admitting: Neurology

## 2021-04-13 DIAGNOSIS — I10 Essential (primary) hypertension: Secondary | ICD-10-CM

## 2021-04-13 DIAGNOSIS — R7989 Other specified abnormal findings of blood chemistry: Secondary | ICD-10-CM

## 2021-04-13 LAB — COMPREHENSIVE METABOLIC PANEL
ALT: 20 IU/L (ref 0–32)
AST: 19 IU/L (ref 0–40)
Albumin/Globulin Ratio: 1.9 (ref 1.2–2.2)
Albumin: 4.7 g/dL (ref 3.8–4.8)
Alkaline Phosphatase: 113 IU/L (ref 44–121)
BUN/Creatinine Ratio: 13 (ref 9–23)
BUN: 17 mg/dL (ref 6–24)
Bilirubin Total: 0.2 mg/dL (ref 0.0–1.2)
CO2: 21 mmol/L (ref 20–29)
Calcium: 10.4 mg/dL — ABNORMAL HIGH (ref 8.7–10.2)
Chloride: 101 mmol/L (ref 96–106)
Creatinine, Ser: 1.36 mg/dL — ABNORMAL HIGH (ref 0.57–1.00)
Globulin, Total: 2.5 g/dL (ref 1.5–4.5)
Glucose: 93 mg/dL (ref 65–99)
Potassium: 4.4 mmol/L (ref 3.5–5.2)
Sodium: 137 mmol/L (ref 134–144)
Total Protein: 7.2 g/dL (ref 6.0–8.5)
eGFR: 48 mL/min/{1.73_m2} — ABNORMAL LOW (ref >59)

## 2021-04-13 NOTE — Telephone Encounter (Signed)
Faxed office visit notes from 04/12/21 to [employer] at [phone], attn: Normand Sloop. Received fax confirmation.

## 2021-04-19 NOTE — Telephone Encounter (Signed)
Faxed completed/signed orders for [facility]WC to [employer] at [phone]. Received fax confirmation.

## 2021-04-20 ENCOUNTER — Other Ambulatory Visit: Payer: Self-pay | Admitting: Neurology

## 2021-04-20 ENCOUNTER — Other Ambulatory Visit: Payer: Self-pay | Admitting: Physician Assistant

## 2021-04-20 DIAGNOSIS — R079 Chest pain, unspecified: Secondary | ICD-10-CM

## 2021-04-28 ENCOUNTER — Encounter: Payer: Self-pay | Admitting: *Deleted

## 2021-04-29 ENCOUNTER — Other Ambulatory Visit: Payer: Self-pay | Admitting: Neurology

## 2021-04-30 ENCOUNTER — Other Ambulatory Visit: Payer: Self-pay | Admitting: Internal Medicine

## 2021-04-30 DIAGNOSIS — E785 Hyperlipidemia, unspecified: Secondary | ICD-10-CM

## 2021-05-20 ENCOUNTER — Telehealth: Payer: Self-pay | Admitting: Internal Medicine

## 2021-05-20 NOTE — Telephone Encounter (Signed)
*  STAT* If patient is at the pharmacy, call can be transferred to refill team.   1. Which medications need to be refilled? (please list name of each medication and dose if known) metoprolol tartrate (LOPRESSOR) 50 MG tablet  2. Which pharmacy/location (including street and city if local pharmacy) is medication to be sent to? [facility] on [address]  3. Do they need a 30 day or 90 day supply? 90 day  Patient is out of medication.

## 2021-05-23 MED ORDER — AMLODIPINE BESYLATE 10 MG PO TABS
10.0000 mg | ORAL_TABLET | Freq: Every day | ORAL | 0 refills | Status: DC
Start: 2021-05-23 — End: 2021-07-21

## 2021-05-31 ENCOUNTER — Other Ambulatory Visit: Payer: Self-pay | Admitting: Internal Medicine

## 2021-05-31 DIAGNOSIS — E785 Hyperlipidemia, unspecified: Secondary | ICD-10-CM

## 2021-06-09 ENCOUNTER — Other Ambulatory Visit: Payer: Self-pay

## 2021-06-09 ENCOUNTER — Ambulatory Visit (INDEPENDENT_AMBULATORY_CARE_PROVIDER_SITE_OTHER): Payer: 59

## 2021-06-09 ENCOUNTER — Encounter (HOSPITAL_COMMUNITY): Payer: Self-pay

## 2021-06-09 ENCOUNTER — Ambulatory Visit (HOSPITAL_COMMUNITY)
Admission: EM | Admit: 2021-06-09 | Discharge: 2021-06-09 | Disposition: A | Discharge status: Home / Self Care | Payer: 59 | Attending: Family Medicine | Admitting: Family Medicine

## 2021-06-09 DIAGNOSIS — M25531 Pain in right wrist: Secondary | ICD-10-CM | POA: Diagnosis not present

## 2021-06-09 DIAGNOSIS — M65312 Trigger thumb, left thumb: Secondary | ICD-10-CM

## 2021-06-09 DIAGNOSIS — S63501A Unspecified sprain of right wrist, initial encounter: Secondary | ICD-10-CM | POA: Diagnosis not present

## 2021-06-09 DIAGNOSIS — W19XXXA Unspecified fall, initial encounter: Secondary | ICD-10-CM | POA: Diagnosis not present

## 2021-06-09 NOTE — Discharge Instructions (Addendum)
Please try the brace  Please try ice  Please follow up for an injection

## 2021-06-09 NOTE — ED Triage Notes (Signed)
Pt presents with c/o left and right wrist pain.   States the left thumb was causing pain a while ago and states she fell yesterday and the pain has moved to the right wrist.

## 2021-06-09 NOTE — ED Provider Notes (Signed)
[place]    CSN: [id number] Arrival date & time: 06/09/21  5035      History   Chief Complaint Chief Complaint  Patient presents with   thumb pain   Wrist Pain    HPI Tacarra Justo is a 50 y.o. female.  She is presenting with right wrist pain and left thumb pain.  She had a fall earlier today and landed on her right wrist.  Having significant pain over the radial aspect of the wrist.  Has been had triggering of the left thumb.  Is acute on chronic in nature.  HPI  Past Medical History:  Diagnosis Date   Anemia    Anxiety    Bipolar disorder in full remission ([place])    CAD in native artery cardiologist--  dr hilty   a. 09/2014 Cath/PCI in setting of [place] - s/p 2.25 x 12 mm Promus Premier DES to mid RCA;  b. 11/2016 Myoview: EF 56%, no ischemia;  c. 12/2016 NSTEMI/PCI: LM nl, LAd 25p, LCX 30p, RCA 100p (2.75x38 Promus Premier DES), mRCA 10 ISR, EF 50-55%.   CKD (chronic kidney disease), stage III ([place])    Depression    Genetic testing 04/23/2017   Ms. Jost underwent genetic counseling and testing for hereditary cancer syndromes on 04/05/2017. Her results were negative for mutations in all 46 genes analyzed by [facility]'s 46-gene Common Hereditary Cancers Panel. Genes analyzed include: APC, ATM, AXIN2, BARD1, BMPR1A, BRCA1, BRCA2, BRIP1, CDH1, CDKN2A, CHEK2, CTNNA1, DICER1, EPCAM, GREM1, HOXB13, KIT, MEN1, MLH1, MSH2, MSH3, MSH6, MUTYH, NBN,   GERD (gastroesophageal reflux disease)    Headache    History of external beam radiation therapy    right breast 07-27-2017  to 09-25-2017   History of non-ST elevation myocardial infarction (NSTEMI)    HOCM (hypertrophic obstructive cardiomyopathy) ([place]) followed by cardiology   a. 12/2016 Echo: EF 65-70%,  mod conc LVH, dynamic obstruction @ rest, peak velocity of 291 cm/sec w/ peak gradient of 58mHg, no rwma, Gr1 DD, triv TR, PASP 19mg.   Hyperlipidemia    Hypertension    Malignant neoplasm of lower-outer quadrant of right  breast of female, estrogen receptor positive [facility] dr gu[facility]indi Adie dx 06/ 2018--- right breast invasive lobular carcinoma, ductal carcinoma w/ LCIS---- 06-21-2017 s/p right breast lumpecotmy w/ sln disseciton;   completed radiation 09-25-2017;  on tamoxifen   Myocardial infarction ([facility]   2017   S/P drug eluting coronary stent placement    09-14-2014---  PCI with DES x1 to midRCA;  12-31-2016  PCI with DES x1 to proxRCA   Trans[facility] dr saFelecia Shelling 01/ 2017  dx transverse myelitis w/ right side numbness (09-01-2019  currently lower extremity weakness, mucsle spasms, and gait disturbance)   Wears glasses     Patient Active Problem List   Diagnosis Date Noted   Hyponatremia 10/14/2020   Chronic diastolic CHF (congestive heart failure) (HCS[facility]12/31/2021   Neuropathic pain 07/06/2020   Cervical spinal stenosis 06/15/2020   Lhermitte's sign positive 06/15/2020   Labial cyst    Complex cyst of right ovary 12/05/2019   Hypertensive urgency 07/28/2018   Chronic anemia    MDD (major depressive disorder), recurrent episode, severe (HCHaw[facility]11/20/2018   Neck stiffness 08/22/2017   Abnormal finding on MRI of brain 08/22/2017   HOCM (hypertrophic obstructive cardiomyopathy) ([facility]   Chest pain 05/06/2017   Genetic testing 04/23/2017   Malignant neoplasm of lower-outer quadrant of right breast of female,  estrogen receptor positive ([place]) 03/29/2017   Dysesthesia 03/12/2017   Palpitations 02/23/2017   S/P cardiac cath (12/2016) a. acute total occlusion of the RCA tx w/PCI DES, b. mild nonobs LAD/LCx stenosis, c. 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   Acute-on-chronic kidney injury ([place]) 11/12/2015   Neck pain 11/12/2015   Hypokalemia 11/12/2015   Abnormal MRI, neck 11/12/2015   Numbness 11/12/2015   Hypertensive crisis    Hyperlipidemia    Coronary artery disease involving native coronary artery of native heart without angina pectoris    Facial numbness    Right arm numbness    Throat pain 11/09/2015   Drooling 11/09/2015   CAD (coronary artery disease), native coronary artery 09/15/2014   Headache 09/15/2014   UTI (urinary tract infection): Probable 09/14/2014   Candida infection of genital region 09/14/2014   Unstable angina ([place]) 09/13/2014   HLD (hyperlipidemia) 09/13/2014   Otitis 09/13/2014   ACS (acute coronary syndrome) ([place]) 09/13/2014   Tobacco abuse 09/13/2014   Essential hypertension 09/09/2014    Past Surgical History:  Procedure Laterality Date   BREAST LUMPECTOMY WITH RADIOACTIVE SEED AND SENTINEL LYMPH NODE BIOPSY Right 06/21/2017   Procedure: RIGHT BREAST BRACKETED SEED GUIDED LUMPECTOMY AND SENTINEL LYMPH NODE BIOPSY;  Surgeon: Jovita Kussmaul, MD;  Location: [facility];  Service: General;  Laterality: Right;   CORONARY/GRAFT ACUTE MI REVASCULARIZATION N/A 12/31/2016   Procedure: Coronary/Graft Acute MI Revascularization;  Surgeon: Sherren Mocha, MD;  Location: [facility];  Service: Cardiovascular;  Laterality: N/A;   DILATATION & CURETTAGE/HYSTEROSCOPY WITH MYOSURE N/A 09/02/2019   Procedure: DILATATION & CURETTAGE/HYSTEROSCOPY WITH MYOSURE;  Surgeon: Aloha Gell, MD;  Location: [facility];  Service: Gynecology;  Laterality: N/A;   HERNIA REPAIR     LEFT HEART CATH AND CORONARY ANGIOGRAPHY N/A 12/31/2016   Procedure: Left Heart Cath and Coronary Angiography;  Surgeon: Sherren Mocha, MD;  Location: [facility];  Service: Cardiovascular;  Laterality: N/A;   LEFT HEART CATHETERIZATION WITH CORONARY ANGIOGRAM N/A 09/14/2014   Procedure: LEFT  HEART CATHETERIZATION WITH CORONARY ANGIOGRAM;  Surgeon: Burnell Blanks, MD;  [facility];  Service: Cardiovascular;  Laterality: N/A;   MASS EXCISION N/A 03/30/2020   Procedure: EXCISION MASS RIGHT LABIA MINORA;  Surgeon: Lafonda Mosses, MD;  Location: [facility];  Service: Gynecology;  Laterality: N/A;   PERCUTANEOUS CORONARY STENT INTERVENTION (PCI-S)  09/14/2014   Procedure: PERCUTANEOUS CORONARY STENT INTERVENTION (PCI-S);  Surgeon: Burnell Blanks, MD;  [facility];  Service: Cardiovascular;;   ROBOTIC ASSISTED SALPINGO OOPHERECTOMY Bilateral 03/30/2020   Procedure: XI ROBOTIC ASSISTED SALPINGO OOPHORECTOMY;  Surgeon: Lafonda Mosses, MD;  Location: [facility];  Service: Gynecology;  Laterality: Bilateral;   TONSILLECTOMY  3300   UMBILICAL HERNIA REPAIR  2001; 2005    OB History   No obstetric history on file.      Home Medications    Prior to Admission medications   Medication Sig Start Date End Date Taking? Authorizing Provider  amLODipine (NORVASC) 10 MG tablet Take 1 tablet (10 mg total) by mouth daily. 05/23/21   Hilty,  Nadean Corwin, MD  anastrozole (ARIMIDEX) 1 MG tablet Take 1 tablet (1 mg total) by mouth daily. 11/08/20   Nicholas Lose, MD  aspirin 81 MG chewable tablet Chew 1 tablet (81 mg total) by mouth daily. 09/16/14   Nita Sells, MD  atorvastatin (LIPITOR) 80 MG tablet TAKE 1 TABLET BY MOUTH EVERY DAY 05/31/21   Hilty, Nadean Corwin, MD  baclofen (LIORESAL) 10 MG tablet One po qid 04/12/21   Sater, Nanine Means, MD  etodolac (LODINE) 400 MG tablet Take 1 tablet (400 mg total) by mouth 2 (two) times daily. 09/20/20   Sater, Nanine Means, MD  fluticasone (FLONASE) 50 MCG/ACT nasal spray Place 1-2 sprays into both nostrils daily as needed for allergies or rhinitis.    [provider]  furosemide (LASIX) 20 MG tablet Take 1 tablet (20 mg total) by mouth daily as needed (fluid retention.). Patient taking differently: Take 20 mg by mouth  daily as needed for fluid (fluid retention.). 08/30/20   Hilty, Nadean Corwin, MD  gabapentin (NEURONTIN) 300 MG capsule Take 9107m (3 capsules) by mouth three times daily Patient taking differently: Take 900 mg by mouth 3 (three) times daily. 09/20/20   Sater, RNanine Means MD  Isopropyl Alcohol (SWIMMERS EAR DROPS OT) Place 2 drops into both ears daily as needed (water in ears).    [provider]  lisinopril (ZESTRIL) 40 MG tablet Take 1 tablet (40 mg total) by mouth daily. 01/14/21 04/14/21  BLorretta Harp MD  methocarbamol (ROBAXIN) 500 MG tablet Take 1 tablet (500 mg total) by mouth 3 (three) times daily as needed for muscle spasms. TAKE 1 TABLET BY MOUTH THREE TIMES A DAY AS NEEDED 05/02/21   Sater, RNanine Means MD  metoprolol tartrate (LOPRESSOR) 50 MG tablet Take 1 tablet (50 mg total) by mouth 2 (two) times daily. 10/15/20 11/14/20  SManuella Ghazi Pratik D, DO  nitroGLYCERIN (NITROSTAT) 0.4 MG SL tablet PLACE 1 TABLET UNDER THE TONGUE EVERY 5 MINUTES AS NEEDED FOR CHEST PAIN, AS DIRECTED 01/10/21   MAlmyra Deforest PA  Oxcarbazepine (TRILEPTAL) 300 MG tablet Take one pill qAM, one pill po qPM and two pills po qHS 04/12/21   Sater, RNanine Means MD  oxybutynin (DITROPAN-XL) 10 MG 24 hr tablet TAKE 1 TABLET AT BEDTIME 04/20/21   Sater, RNanine Means MD  Probiotic Product (PROBIOTIC-10 PO) Take 1 capsule by mouth daily.    [provider]  spironolactone (ALDACTONE) 50 MG tablet Take 1 tablet each morning and 1/2 tablet each evening. 01/18/21   BLorretta Harp MD  traMADol (ULTRAM) 50 MG tablet TAKE 1 TABLET(50 MG) BY MOUTH TWICE DAILY AS NEEDED 01/26/21   AMelvenia Beam MD  venlafaxine XR (EFFEXOR-XR) 75 MG 24 hr capsule TAKE 1 CAPSULE(75 MG) BY MOUTH DAILY WITH BREAKFAST Patient taking differently: Take 75 mg by mouth daily with breakfast. 11/23/20   GNicholas Lose MD    Family History Family History  Problem Relation Age of Onset   Diabetes Mother    Heart disease Mother    Hyperlipidemia Mother     Hypertension Mother    Breast cancer Mother    Lung cancer Father    Drug abuse Father    Brain cancer Father [phone]  Bone cancer Father [phone]  Drug abuse Sister    Mental illness Brother    Mental illness Maternal Grandmother    Stomach cancer Paternal Grandmother [phone]      [id number]    Social History Social History  Tobacco Use   Smoking status: Former    Packs/day: 0.50    Years: 30.00    Pack years: 15.00    Types: Cigarettes    Quit date: 10/05/2015    Years since quitting: 5.6   Smokeless tobacco: Never  Vaping Use   Vaping Use: Former  Substance Use Topics   Alcohol use: Yes    Alcohol/week: 2.0 standard drinks    Types: 2 Cans of beer per week    Comment: occas.   Drug use: No     Allergies   Pork-derived products   Review of Systems Review of Systems See HPI  Physical Exam Triage Vital Signs ED Triage Vitals  Enc Vitals Group     BP 06/09/21 1942 134/84     Pulse Rate 06/09/21 1942 80     Resp 06/09/21 1942 18     Temp 06/09/21 1942 98.2 F (36.8 C)     Temp Source 06/09/21 1942 Oral     SpO2 06/09/21 1942 100 %     Weight --      Height --      Head Circumference --      Peak Flow --      Pain Score 06/09/21 1941 6     Pain Loc --      Pain Edu? --      Excl. in [place]? --    No data found.  Updated Vital Signs BP 134/84 (BP Location: Right Arm)   Pulse 80   Temp 98.2 F (36.8 C) (Oral)   Resp 18   LMP 03/06/2020   SpO2 100%   Visual Acuity Right Eye Distance:   Left Eye Distance:   Bilateral Distance:    Right Eye Near:   Left Eye Near:    Bilateral Near:     Physical Exam Left thumb: Triggering observed of the thumb. Right wrist: No swelling or ecchymosis. Tenderness to palpation over the snuffbox. Normal range of motion. Neurovascular intact  UC Treatments / Results  Labs (all labs ordered are listed, but only abnormal results are displayed) Labs Reviewed - No data to display  EKG   Radiology DG Wrist Complete  Right  Result Date: 06/09/2021 CLINICAL DATA:  50 year old female with history of trauma from a fall. Right-sided wrist pain. EXAM: RIGHT WRIST - COMPLETE 3+ VIEW COMPARISON:  12/01/2020. FINDINGS: There is no evidence of fracture or dislocation. There is no evidence of arthropathy or other focal bone abnormality. Soft tissues are unremarkable. IMPRESSION: Negative. Electronically Signed   By: Vinnie Langton M.D.   On: 06/09/2021 20:07    Procedures Procedures (including critical care time)  Medications Ordered in UC Medications - No data to display  Initial Impression / Assessment and Plan / UC Course  I have reviewed the triage vital signs and the nursing notes.  Pertinent labs & imaging results that were available during my care of the patient were reviewed by me and considered in my medical decision making (see chart for details).     Ms. Altemose is a 50 year old female is presenting with a right wrist sprain and left trigger thumb.  She was placed in a thumb spica on the right.  She was placed in a finger splint of the left thumb.  X-rays were negative.  Counseled on follow-up.  Final Clinical Impressions(s) / UC Diagnoses   Final diagnoses:  Sprain of right wrist, initial encounter  Trigger finger of left thumb     Discharge Instructions  Please try the brace  Please try ice  Please follow up for an injection      ED Prescriptions   None    PDMP not reviewed this encounter.   Rosemarie Ax, MD 06/09/21 2025

## 2021-06-17 ENCOUNTER — Ambulatory Visit (INDEPENDENT_AMBULATORY_CARE_PROVIDER_SITE_OTHER): Payer: 59 | Admitting: Cardiovascular Disease

## 2021-06-17 ENCOUNTER — Encounter: Payer: Self-pay | Admitting: Cardiovascular Disease

## 2021-06-17 ENCOUNTER — Other Ambulatory Visit: Payer: Self-pay

## 2021-06-17 VITALS — BP 144/82 | HR 90 | Ht 62.0 in | Wt 138.6 lb

## 2021-06-17 DIAGNOSIS — I701 Atherosclerosis of renal artery: Secondary | ICD-10-CM

## 2021-06-17 DIAGNOSIS — I10 Essential (primary) hypertension: Secondary | ICD-10-CM

## 2021-06-17 DIAGNOSIS — I739 Peripheral vascular disease, unspecified: Secondary | ICD-10-CM | POA: Diagnosis not present

## 2021-06-17 NOTE — Assessment & Plan Note (Signed)
Anna Henson had a abdominal CTA performed 12/17/2020 that showed a focal high-grade distal left common iliac artery stenosis.  She does complain of pain in both of her legs when she ambulates but she has attributed that to transverse myelitis which she had back in 2015 or so.  She does not lateralize her symptoms.  I am going to get aortoiliac and lower extremity arterial Doppler studies to further evaluate.

## 2021-06-17 NOTE — Patient Instructions (Addendum)
Medication Instructions:  Your Physician recommend you continue on your current medication as directed.    *If you need a refill on your cardiac medications before your next appointment, please call your pharmacy*   Lab Work: None ordered today   Testing/Procedures: Your physician has requested that you have a lower extremity arterial exercise duplex. During this test, exercise and ultrasound are used to evaluate arterial blood flow in the legs. Allow one hour for this exam. There are no restrictions or special instructions.  Your physician has requested that you have an ankle brachial index (ABI). During this test an ultrasound and blood pressure cuff are used to evaluate the arteries that supply the arms and legs with blood. Allow thirty minutes for this exam. There are no restrictions or special instructions.  Vas US Aorta/IVC/Iliacs  Your physician has requested that you have a renal artery duplex in January, 2023. During this test, an ultrasound is used to evaluate blood flow to the kidneys. Allow one hour for this exam. Do not eat after midnight the day before and avoid carbonated beverages. Take your medications as you usually do.      Follow-Up: At [facility], you and your health needs are our priority.  As part of our continuing mission to provide you with exceptional heart care, we have created designated Provider Care Teams.  These Care Teams include your primary Cardiologist (physician) and Advanced Practice Providers (APPs -  Physician Assistants and Nurse Practitioners) who all work together to provide you with the care you need, when you need it.  We recommend signing up for the patient portal called "MyChart".  Sign up information is provided on this After Visit Summary.  MyChart is used to connect with patients for Virtual Visits (Telemedicine).  Patients are able to view lab/test results, encounter notes, upcoming appointments, etc.  Non-urgent messages can be sent to  your provider as well.   To learn more about what you can do with MyChart, go to [email or web address].    Your next appointment:   1 year(s)  The format for your next appointment:   In Person  Provider:   Quay Burow, MD

## 2021-06-17 NOTE — Progress Notes (Signed)
06/17/2021 [name]   11/10/1970  [id number]  Primary Physician Pcp, No Primary Cardiologist: Lorretta Harp MD Garret Reddish, [place], [place]  HPI:  Teaghen Hedstrom is a 50 y.o.  mild to moderately overweight single African-American female with no children referred to me by Dr. Debara Pickett for evaluation of potential renal vascular hypertension.  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  I last saw her in the office 12/14/2020.  Her cardiac risk factors are notable for discontinue tobacco abuse having smoked approximately 12 pack years and discontinued in 2015.  She has treated hypertension and hyperlipidemia.  Her mother had myocardial infarction and stents.  She has had a heart attack in the past and has had RCA stenting 09/14/2014 and again 12/31/2016.  She has had hypertension for many years and has had several admissions for hypertensive urgency.  She had renal Doppler studies performed in our office 10/15/2020 that suggested mild to moderate left renal artery stenosis with a renal aortic ratio of 3.58 and subsequent CTA performed 11/19/2020 that showed moderate left renal artery stenosis in the 50% range which was confirmatory.  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  Since I saw her 6 months ago she continues to do well.  Her blood pressures seem to be adequately controlled on her current medications.  She denies chest pain or shortness of breath.  She does complain of pain in both legs which she has attributed to transverse myelitis.   Current Meds  Medication Sig   amLODipine (NORVASC) 10 MG tablet Take 1 tablet (10 mg total) by mouth daily.   anastrozole (ARIMIDEX) 1 MG tablet Take 1 tablet (1 mg total) by mouth daily.   aspirin 81 MG chewable tablet Chew 1 tablet (81 mg total) by mouth daily.   atorvastatin (LIPITOR) 80 MG tablet TAKE 1 TABLET BY MOUTH EVERY DAY   baclofen (LIORESAL) 10 MG tablet One po qid   etodolac  (LODINE) 400 MG tablet Take 1 tablet (400 mg total) by mouth 2 (two) times daily.   fluticasone (FLONASE) 50 MCG/ACT nasal spray Place 1-2 sprays into both nostrils daily as needed for allergies or rhinitis.   furosemide (LASIX) 20 MG tablet Take 1 tablet (20 mg total) by mouth daily as needed (fluid retention.).   gabapentin (NEURONTIN) 300 MG capsule Take '900mg'$  (3 capsules) by mouth three times daily   Isopropyl Alcohol (SWIMMERS EAR DROPS OT) Place 2 drops into both ears daily as needed (water in ears).   methocarbamol (ROBAXIN) 500 MG tablet Take 1 tablet (500 mg total) by mouth 3 (three) times daily as needed for muscle spasms. TAKE 1 TABLET BY MOUTH THREE TIMES A DAY AS NEEDED   nitroGLYCERIN (NITROSTAT) 0.4 MG SL tablet PLACE 1 TABLET UNDER THE TONGUE EVERY 5 MINUTES AS NEEDED FOR CHEST PAIN, AS DIRECTED   Oxcarbazepine (TRILEPTAL) 300 MG tablet Take one pill qAM, one pill po qPM and two pills po qHS   oxybutynin (DITROPAN-XL) 10 MG 24 hr tablet TAKE 1 TABLET AT BEDTIME   Probiotic Product (PROBIOTIC-10 PO) Take 1 capsule by mouth daily.   spironolactone (ALDACTONE) 50 MG tablet Take 1 tablet each morning and 1/2 tablet each evening.   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   Drug use: No   Sexual activity: Not Currently  Other Topics Concern   Not on file  Social History Narrative   Consumes 2 cups of caffeine daily   Social Determinants of Health   Financial Resource Strain: Not on file  Food Insecurity: Not on file  Transportation Needs: Not on file  Physical Activity: Not on file  Stress: Not on file  Social Connections: Not on file  Intimate Partner Violence: Not on file     Review of Systems: General: negative for chills, fever, night sweats or weight changes.  Cardiovascular: negative for chest pain, dyspnea on exertion, edema, orthopnea, palpitations, paroxysmal nocturnal dyspnea or shortness of breath Dermatological: negative for rash Respiratory: negative for cough or wheezing Urologic: negative for hematuria Abdominal: negative for nausea, vomiting, diarrhea, bright red blood per rectum, melena, or hematemesis Neurologic: negative for visual changes, syncope, or dizziness All other systems reviewed and are otherwise negative except as noted above.    Blood pressure (!) 144/82, pulse 90, height '5\' 2"'$  (1.575 m), weight 138 lb 9.6 oz (62.9 kg), last menstrual period 03/06/2020, SpO2 98 %.  General appearance: alert and no distress Neck: no adenopathy, no carotid bruit, no JVD, supple, symmetrical, trachea midline, and thyroid not enlarged, symmetric, no tenderness/mass/nodules Lungs: clear to auscultation bilaterally Heart: regular rate and rhythm, S1, S2 normal, no murmur, click, rub or gallop Extremities: extremities normal, atraumatic, no cyanosis or edema Pulses: 2+ and symmetric Skin: Skin color, texture, turgor normal. No rashes or lesions Neurologic: Grossly normal  EKG not performed today  ASSESSMENT AND PLAN:   Essential  hypertension History of essential hypertension a blood pressure measured today at 144/82.  She is on amlodipine, lisinopril and metoprolol.  She does have moderate right renal artery stenosis by duplex ultrasound 10/15/2020 and also by CTA 11/19/2020.  We will continue to follow her renal Doppler study.  Peripheral arterial disease ([place]) Ms. Donoso had a abdominal CTA performed 12/17/2020 that showed a focal high-grade distal left common iliac artery stenosis.  She does complain of pain in both of her legs when she ambulates but she has attributed that to transverse myelitis which she had back in 2015 or so.  She does not lateralize her symptoms.  I am going to get aortoiliac and lower extremity arterial Doppler studies to further evaluate.     Lorretta Harp MD FACP,FACC,FAHA, [facility] 06/17/2021 4:09 PM

## 2021-06-17 NOTE — Assessment & Plan Note (Signed)
History of essential hypertension a blood pressure measured today at 144/82.  She is on amlodipine, lisinopril and metoprolol.  She does have moderate right renal artery stenosis by duplex ultrasound 10/15/2020 and also by CTA 11/19/2020.  We will continue to follow her renal Doppler study.

## 2021-06-23 ENCOUNTER — Other Ambulatory Visit: Payer: Self-pay | Admitting: Neurology

## 2021-06-26 ENCOUNTER — Other Ambulatory Visit: Payer: Self-pay | Admitting: Internal Medicine

## 2021-06-26 DIAGNOSIS — E785 Hyperlipidemia, unspecified: Secondary | ICD-10-CM

## 2021-06-27 ENCOUNTER — Ambulatory Visit (HOSPITAL_COMMUNITY)
Admission: RE | Admit: 2021-06-27 | Payer: 59 | Source: Ambulatory Visit | Attending: Cardiovascular Disease | Admitting: Cardiovascular Disease

## 2021-06-29 ENCOUNTER — Other Ambulatory Visit: Payer: Self-pay | Admitting: Neurology

## 2021-07-01 ENCOUNTER — Ambulatory Visit (HOSPITAL_COMMUNITY): Payer: 59

## 2021-07-04 ENCOUNTER — Ambulatory Visit (HOSPITAL_COMMUNITY)
Admission: RE | Admit: 2021-07-04 | Payer: 59 | Source: Ambulatory Visit | Attending: Cardiovascular Disease | Admitting: Cardiovascular Disease

## 2021-07-07 ENCOUNTER — Ambulatory Visit: Payer: 59 | Admitting: Neurology

## 2021-07-13 ENCOUNTER — Ambulatory Visit (HOSPITAL_COMMUNITY): Payer: 59

## 2021-07-13 ENCOUNTER — Ambulatory Visit (HOSPITAL_COMMUNITY)
Admission: RE | Admit: 2021-07-13 | Payer: 59 | Source: Ambulatory Visit | Attending: Cardiovascular Disease | Admitting: Cardiovascular Disease

## 2021-07-15 ENCOUNTER — Other Ambulatory Visit: Payer: Self-pay | Admitting: Physician Assistant

## 2021-07-15 DIAGNOSIS — R079 Chest pain, unspecified: Secondary | ICD-10-CM

## 2021-07-18 ENCOUNTER — Other Ambulatory Visit: Payer: Self-pay

## 2021-07-18 ENCOUNTER — Ambulatory Visit (HOSPITAL_COMMUNITY)
Admission: RE | Admit: 2021-07-18 | Discharge: 2021-07-18 | Disposition: A | Discharge status: Home / Self Care | Payer: 59 | Source: Ambulatory Visit | Attending: Cardiovascular Disease | Admitting: Cardiovascular Disease

## 2021-07-18 ENCOUNTER — Other Ambulatory Visit: Payer: Self-pay | Admitting: Internal Medicine

## 2021-07-18 ENCOUNTER — Ambulatory Visit (HOSPITAL_BASED_OUTPATIENT_CLINIC_OR_DEPARTMENT_OTHER)
Admission: RE | Admit: 2021-07-18 | Discharge: 2021-07-18 | Disposition: A | Discharge status: Home / Self Care | Payer: 59 | Source: Ambulatory Visit | Attending: Cardiovascular Disease | Admitting: Cardiovascular Disease

## 2021-07-18 DIAGNOSIS — I739 Peripheral vascular disease, unspecified: Secondary | ICD-10-CM | POA: Insufficient documentation

## 2021-07-21 ENCOUNTER — Other Ambulatory Visit: Payer: Self-pay

## 2021-07-21 MED ORDER — AMLODIPINE BESYLATE 10 MG PO TABS: 10.0000 mg | ORAL_TABLET | Freq: Every day | ORAL | 0 refills | Status: DC

## 2021-07-24 ENCOUNTER — Other Ambulatory Visit: Payer: Self-pay | Admitting: Internal Medicine

## 2021-07-24 DIAGNOSIS — E785 Hyperlipidemia, unspecified: Secondary | ICD-10-CM

## 2021-07-29 ENCOUNTER — Ambulatory Visit (INDEPENDENT_AMBULATORY_CARE_PROVIDER_SITE_OTHER): Payer: 59 | Admitting: Cardiovascular Disease

## 2021-07-29 ENCOUNTER — Encounter: Payer: Self-pay | Admitting: Cardiovascular Disease

## 2021-07-29 ENCOUNTER — Other Ambulatory Visit: Payer: Self-pay

## 2021-07-29 DIAGNOSIS — I739 Peripheral vascular disease, unspecified: Secondary | ICD-10-CM | POA: Diagnosis not present

## 2021-07-29 NOTE — Assessment & Plan Note (Signed)
Anna Henson was referred to me by Dr. Debara Pickett for evaluation of PAD.  She initially was referred for renal vessel hypertension.  Renal Dopplers revealed a left renal aortic ratio of 3.58 and a subsequent CTA showed an intermediate lesion with a left renal artery stenosis in the 50% range not thought to be hemodynamically significant.  At that time CTA also showed a focal high-grade distal left common iliac artery stenosis.  The patient has had transverse myelitis in the past.  She complains of pain in both of her legs both at rest and with ambulation.  Recent Doppler studies performed 07/18/2021 revealed a right ABI of 0.92 and a left of 0.88.  There were no significant obstructive lesions on the right side however on the left side there was a left iliac lesion and a mid left SFA lesion.  The patient wishes to proceed with angiography and endovascular therapy.

## 2021-07-29 NOTE — H&P (View-Only) (Signed)
07/29/2021 [name]   [birth year]  [id number]  Primary Physician Pcp, No Primary Cardiologist: Lorretta Harp MD Garret Reddish, [place], [place]  HPI:  Anna Henson is a 50 y.o.  mild to moderately overweight single African-American female with no children referred to me by Dr. Debara Pickett for evaluation of potential renal vascular hypertension.  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  I last saw her in the office 06/17/2021.Anna Henson  Her cardiac risk factors are notable for discontinue tobacco abuse having smoked approximately 12 pack years and discontinued in 2015.  She has treated hypertension and hyperlipidemia.  Her mother had myocardial infarction and stents.  She has had a heart attack in the past and has had RCA stenting 09/14/2014 and again 12/31/2016.  She has had hypertension for many years and has had several admissions for hypertensive urgency.  She had renal Doppler studies performed in our office 10/15/2020 that suggested mild to moderate left renal artery stenosis with a renal aortic ratio of 3.58 and subsequent CTA performed 11/19/2020 that showed moderate left renal artery stenosis in the 50% range which was confirmatory.  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  Since I saw her 6 weeks ago I did get lower extremity arterial Doppler studies that revealed a right ABI of 0.92 and a left of 0.88.  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  She complains of pain both at rest and with ambulation on the right and the left side but she attributes the right side symptoms to her previous transverse myelitis.  She wishes to proceed with angiography and endovascular therapy on the left.   Current Meds  Medication Sig   amLODipine (NORVASC) 10 MG tablet Take 1 tablet (10 mg total) by mouth daily.   anastrozole (ARIMIDEX) 1 MG tablet Take 1 tablet (1 mg  total) by mouth daily.   aspirin 81 MG chewable tablet Chew 1 tablet (81 mg total) by mouth daily.   atorvastatin (LIPITOR) 80 MG tablet TAKE 1 TABLET BY MOUTH EVERY DAY   baclofen (LIORESAL) 10 MG tablet One po qid   etodolac (LODINE) 400 MG tablet Take 1 tablet (400 mg total) by mouth 2 (two) times daily.   fluticasone (FLONASE) 50 MCG/ACT nasal spray Place 1-2 sprays into both nostrils daily as needed for allergies or rhinitis.   furosemide (LASIX) 20 MG tablet TAKE 1 TABLET EVERY DAY FOR FLUID RETENTION   gabapentin (NEURONTIN) 300 MG capsule TAKE 3 CAPSULES BY MOUTH 3 TIMES DAY   Isopropyl Alcohol (SWIMMERS EAR DROPS OT) Place 2 drops into both ears daily as needed (water in ears).   lisinopril (ZESTRIL) 40 MG tablet Take 1 tablet (40 mg total) by mouth daily.   methocarbamol (ROBAXIN) 500 MG tablet Take 1 tablet (500 mg total) by mouth 3 (three) times daily as needed for muscle spasms. TAKE 1 TABLET BY MOUTH THREE TIMES A DAY AS NEEDED   Oxcarbazepine (TRILEPTAL) 300 MG tablet Take one pill qAM, one pill po qPM and two pills po qHS   oxybutynin (DITROPAN-XL) 10 MG 24 hr tablet TAKE 1 TABLET AT BEDTIME   Probiotic Product (PROBIOTIC-10 PO) Take 1 capsule by mouth daily.   spironolactone (ALDACTONE) 50 MG tablet Take 1 tablet each morning and 1/2 tablet each evening.   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   Drug use: No   Sexual activity: Not Currently  Other Topics Concern   Not on file  Social History Narrative   Consumes 2 cups of caffeine daily   Social Determinants of Health   Financial Resource Strain: Not on file  Food Insecurity: Not on file  Transportation Needs: Not on file  Physical Activity: Not on file  Stress: Not on file  Social Connections: Not on file  Intimate Partner Violence: Not on file     Review of Systems: General: negative for chills, fever, night sweats or weight changes.  Cardiovascular: negative for chest pain, dyspnea on exertion, edema, orthopnea, palpitations, paroxysmal nocturnal dyspnea or shortness of breath Dermatological: negative for rash Respiratory: negative for cough or wheezing Urologic: negative for hematuria Abdominal: negative for nausea, vomiting, diarrhea, bright red blood per rectum, melena, or hematemesis Neurologic: negative for visual changes, syncope, or dizziness All other systems reviewed and are otherwise negative except as noted above.    Blood pressure 140/76, pulse 64, height 5' 1.5" (1.562 m), weight 141 lb (64 kg), last menstrual period 03/06/2020, SpO2 100 %.  General appearance: alert and no distress Neck: no adenopathy, no carotid bruit, no JVD, supple, symmetrical, trachea midline, and thyroid not enlarged, symmetric, no tenderness/mass/nodules Lungs: clear to auscultation bilaterally Heart: regular rate and rhythm, S1, S2 normal, no murmur, click, rub or gallop Extremities: extremities normal, atraumatic, no cyanosis or edema Pulses: 2+ and symmetric Skin: Skin color, texture, turgor normal.  No rashes or lesions Neurologic: Grossly normal  EKG sinus rhythm at 64 without ST or T wave changes.  Personally reviewed this EKG.  ASSESSMENT AND PLAN:   Peripheral arterial disease ([place]) Ms. Beaubien was referred to me by Dr. Debara Pickett for evaluation of PAD.  She initially was referred for renal vessel hypertension.  Renal Dopplers revealed a left renal aortic ratio of 3.58 and a subsequent CTA showed an intermediate lesion with a left renal artery stenosis in the 50% range not thought to be hemodynamically significant.  At that time CTA also showed a focal high-grade distal left common iliac artery stenosis.  The patient has had transverse myelitis in the past.  She complains of pain in both of her legs both at rest and with ambulation.  Recent Doppler studies performed 07/18/2021 revealed a right ABI of 0.92 and a left of 0.88.  There were no significant obstructive lesions on the right side however on the left side there was a left iliac lesion and a mid left SFA lesion.  The patient wishes to proceed with angiography and endovascular therapy.     Lorretta Harp MD FACP,FACC,FAHA, [facility] 07/29/2021 5:10 PM

## 2021-07-29 NOTE — Patient Instructions (Signed)
[place] [place] [address] Dept: [phone] Loc: [place]  07/29/2021  You are scheduled for a Peripheral Angiogram on Thursday, November 3 with Dr. Quay Burow.  1. Please arrive at the [facility] (Main Entrance A) at [facility]: [address] [place], [place] [address] at 5:30 AM (This time is two hours before your procedure to ensure your preparation). Free valet parking service is available.   Special note: Every effort is made to have your procedure done on time. Please understand that emergencies sometimes delay scheduled procedures.  2. Diet: Do not eat solid foods after midnight.  The patient may have clear liquids until 5am upon the day of the procedure.  3. Labs: Please return for labs 1 week prior (BMET, CBC)  Our in office lab hours are Monday-Friday 8:00-4:00, closed for lunch 12:45-1:45 pm.  No appointment needed.  4. Medication instructions in preparation for your procedure:   Contrast Allergy: No  Hold furosemide (Lasix) and spironolactone AM of procedure  Hold lisinopril the day before and day of procedure  On the morning of your procedure, take your Aspirin and any morning medicines NOT listed above.  You may use sips of water.  5. Plan for one night stay--bring personal belongings. 6. Bring a current list of your medications and current insurance cards. 7. You MUST have a responsible person to drive you home. 8. Someone MUST be with you the first 24 hours after you arrive home or your discharge will be delayed. 9. Please wear clothes that are easy to get on and off and wear slip-on shoes.  Thank you for allowing [place] to care for you!   -- [facility]   Your physician has requested that you have a lower extremity arterial duplex 1 week after procedure. This test is an ultrasound of the  arteries in the legs. It looks at arterial blood flow in the legs. Allow one hour for Lower Arterial scans. There are no restrictions or special instructions   Follow up with Dr. Gwenlyn Found 2 weeks after procedure

## 2021-07-29 NOTE — Progress Notes (Signed)
07/29/2021 [name]   January 28, 1971  [id number]  Primary Physician Pcp, No Primary Cardiologist: Lorretta Harp MD Garret Reddish, [place], [place]  HPI:  Anna Henson is a 50 y.o.  mild to moderately overweight single African-American female with no children referred to me by Dr. Debara Pickett for evaluation of potential renal vascular hypertension.  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  I last saw her in the office 06/17/2021.Marland Kitchen  Her cardiac risk factors are notable for discontinue tobacco abuse having smoked approximately 12 pack years and discontinued in 2015.  She has treated hypertension and hyperlipidemia.  Her mother had myocardial infarction and stents.  She has had a heart attack in the past and has had RCA stenting 09/14/2014 and again 12/31/2016.  She has had hypertension for many years and has had several admissions for hypertensive urgency.  She had renal Doppler studies performed in our office 10/15/2020 that suggested mild to moderate left renal artery stenosis with a renal aortic ratio of 3.58 and subsequent CTA performed 11/19/2020 that showed moderate left renal artery stenosis in the 50% range which was confirmatory.  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  Since I saw her 6 weeks ago I did get lower extremity arterial Doppler studies that revealed a right ABI of 0.92 and a left of 0.88.  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  She complains of pain both at rest and with ambulation on the right and the left side but she attributes the right side symptoms to her previous transverse myelitis.  She wishes to proceed with angiography and endovascular therapy on the left.   Current Meds  Medication Sig   amLODipine (NORVASC) 10 MG tablet Take 1 tablet (10 mg total) by mouth daily.   anastrozole (ARIMIDEX) 1 MG tablet Take 1 tablet (1 mg  total) by mouth daily.   aspirin 81 MG chewable tablet Chew 1 tablet (81 mg total) by mouth daily.   atorvastatin (LIPITOR) 80 MG tablet TAKE 1 TABLET BY MOUTH EVERY DAY   baclofen (LIORESAL) 10 MG tablet One po qid   etodolac (LODINE) 400 MG tablet Take 1 tablet (400 mg total) by mouth 2 (two) times daily.   fluticasone (FLONASE) 50 MCG/ACT nasal spray Place 1-2 sprays into both nostrils daily as needed for allergies or rhinitis.   furosemide (LASIX) 20 MG tablet TAKE 1 TABLET EVERY DAY FOR FLUID RETENTION   gabapentin (NEURONTIN) 300 MG capsule TAKE 3 CAPSULES BY MOUTH 3 TIMES DAY   Isopropyl Alcohol (SWIMMERS EAR DROPS OT) Place 2 drops into both ears daily as needed (water in ears).   lisinopril (ZESTRIL) 40 MG tablet Take 1 tablet (40 mg total) by mouth daily.   methocarbamol (ROBAXIN) 500 MG tablet Take 1 tablet (500 mg total) by mouth 3 (three) times daily as needed for muscle spasms. TAKE 1 TABLET BY MOUTH THREE TIMES A DAY AS NEEDED   Oxcarbazepine (TRILEPTAL) 300 MG tablet Take one pill qAM, one pill po qPM and two pills po qHS   oxybutynin (DITROPAN-XL) 10 MG 24 hr tablet TAKE 1 TABLET AT BEDTIME   Probiotic Product (PROBIOTIC-10 PO) Take 1 capsule by mouth daily.   spironolactone (ALDACTONE) 50 MG tablet Take 1 tablet each morning and 1/2 tablet each evening.   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   Drug use: No   Sexual activity: Not Currently  Other Topics Concern   Not on file  Social History Narrative   Consumes 2 cups of caffeine daily   Social Determinants of Health   Financial Resource Strain: Not on file  Food Insecurity: Not on file  Transportation Needs: Not on file  Physical Activity: Not on file  Stress: Not on file  Social Connections: Not on file  Intimate Partner Violence: Not on file     Review of Systems: General: negative for chills, fever, night sweats or weight changes.  Cardiovascular: negative for chest pain, dyspnea on exertion, edema, orthopnea, palpitations, paroxysmal nocturnal dyspnea or shortness of breath Dermatological: negative for rash Respiratory: negative for cough or wheezing Urologic: negative for hematuria Abdominal: negative for nausea, vomiting, diarrhea, bright red blood per rectum, melena, or hematemesis Neurologic: negative for visual changes, syncope, or dizziness All other systems reviewed and are otherwise negative except as noted above.    Blood pressure 140/76, pulse 64, height 5' 1.5" (1.562 m), weight 141 lb (64 kg), last menstrual period 03/06/2020, SpO2 100 %.  General appearance: alert and no distress Neck: no adenopathy, no carotid bruit, no JVD, supple, symmetrical, trachea midline, and thyroid not enlarged, symmetric, no tenderness/mass/nodules Lungs: clear to auscultation bilaterally Heart: regular rate and rhythm, S1, S2 normal, no murmur, click, rub or gallop Extremities: extremities normal, atraumatic, no cyanosis or edema Pulses: 2+ and symmetric Skin: Skin color, texture, turgor normal.  No rashes or lesions Neurologic: Grossly normal  EKG sinus rhythm at 64 without ST or T wave changes.  Personally reviewed this EKG.  ASSESSMENT AND PLAN:   Peripheral arterial disease ([place]) Ms. Wachs was referred to me by Dr. Debara Pickett for evaluation of PAD.  She initially was referred for renal vessel hypertension.  Renal Dopplers revealed a left renal aortic ratio of 3.58 and a subsequent CTA showed an intermediate lesion with a left renal artery stenosis in the 50% range not thought to be hemodynamically significant.  At that time CTA also showed a focal high-grade distal left common iliac artery stenosis.  The patient has had transverse myelitis in the past.  She complains of pain in both of her legs both at rest and with ambulation.  Recent Doppler studies performed 07/18/2021 revealed a right ABI of 0.92 and a left of 0.88.  There were no significant obstructive lesions on the right side however on the left side there was a left iliac lesion and a mid left SFA lesion.  The patient wishes to proceed with angiography and endovascular therapy.     Lorretta Harp MD FACP,FACC,FAHA, [facility] 07/29/2021 5:10 PM

## 2021-08-04 ENCOUNTER — Other Ambulatory Visit: Payer: Self-pay | Admitting: *Deleted

## 2021-08-04 DIAGNOSIS — I739 Peripheral vascular disease, unspecified: Secondary | ICD-10-CM

## 2021-08-04 MED ORDER — SODIUM CHLORIDE 0.9% FLUSH: 3.0000 mL | Freq: Two times a day (BID) | INTRAVENOUS | Status: DC

## 2021-08-06 LAB — BASIC METABOLIC PANEL
BUN/Creatinine Ratio: 11 (ref 9–23)
BUN: 10 mg/dL (ref 6–24)
CO2: 22 mmol/L (ref 20–29)
Calcium: 10.1 mg/dL (ref 8.7–10.2)
Chloride: 98 mmol/L (ref 96–106)
Creatinine, Ser: 0.9 mg/dL (ref 0.57–1.00)
Glucose: 69 mg/dL — ABNORMAL LOW (ref 70–99)
Potassium: 4.8 mmol/L (ref 3.5–5.2)
Sodium: 135 mmol/L (ref 134–144)
eGFR: 78 mL/min/{1.73_m2} (ref >59)

## 2021-08-06 LAB — CBC
Hematocrit: 39.5 % (ref 34.0–46.6)
Hemoglobin: 13.9 g/dL (ref 11.1–15.9)
MCH: 32.4 pg (ref 26.6–33.0)
MCHC: 35.2 g/dL (ref 31.5–35.7)
MCV: 92 fL (ref 79–97)
Platelets: 408 10*3/uL (ref 150–450)
RBC: 4.29 x10E6/uL (ref 3.77–5.28)
RDW: 13.9 % (ref 11.7–15.4)
WBC: 9.2 10*3/uL (ref 3.4–10.8)

## 2021-08-09 ENCOUNTER — Encounter: Payer: Self-pay | Admitting: Hematology and Oncology

## 2021-08-09 ENCOUNTER — Telehealth: Payer: Self-pay | Admitting: *Deleted

## 2021-08-09 NOTE — Telephone Encounter (Signed)
Abdominal aortogram scheduled at [facility] for: Thursday August 11, 2021 7:30 AM [facility] Main Entrance A [facility]) at: 5:30 AM   No solid food after midnight prior to cath, clear liquids until 5 AM day of procedure.  Medication instructions: Hold: Lasix-AM of procedure Spironolactone-AM of procedure  Except hold medications usual morning medications can be taken pre-cath with sips of water including aspirin 81 mg.    Confirmed patient has responsible adult to drive home post procedure and be with patient first 24 hours after arriving home.  [facility] does allow one visitor to accompany you and wait in the hospital waiting room while you are there for your procedure. You and your visitor will be asked to wear a mask once you enter the hospital.   Patient reports does not currently have any new symptoms concerning for COVID-19 and no household members with COVID-19 like illness.   Reviewed procedure/mask/visitor instructions with patient.

## 2021-08-09 NOTE — Telephone Encounter (Signed)
Entry error

## 2021-08-10 ENCOUNTER — Telehealth: Payer: Self-pay

## 2021-08-10 DIAGNOSIS — B9689 Other specified bacterial agents as the cause of diseases classified elsewhere: Secondary | ICD-10-CM | POA: Insufficient documentation

## 2021-08-10 NOTE — Telephone Encounter (Signed)
Called pt to discuss mychart message, no answer, no vm available.  Will try pt again later this AM

## 2021-08-11 ENCOUNTER — Ambulatory Visit (HOSPITAL_COMMUNITY)
Admission: RE | Disposition: A | Discharge status: Home / Self Care | Payer: Self-pay | Source: Home / Self Care | Attending: Cardiovascular Disease

## 2021-08-11 ENCOUNTER — Encounter (HOSPITAL_COMMUNITY): Payer: Self-pay | Admitting: Cardiovascular Disease

## 2021-08-11 ENCOUNTER — Other Ambulatory Visit: Payer: Self-pay

## 2021-08-11 ENCOUNTER — Ambulatory Visit (HOSPITAL_COMMUNITY)
Admission: RE | Admit: 2021-08-11 | Discharge: 2021-08-12 | Disposition: A | Discharge status: Home / Self Care | Payer: 59 | Attending: Cardiovascular Disease | Admitting: Cardiovascular Disease

## 2021-08-11 DIAGNOSIS — I701 Atherosclerosis of renal artery: Secondary | ICD-10-CM | POA: Insufficient documentation

## 2021-08-11 DIAGNOSIS — I252 Old myocardial infarction: Secondary | ICD-10-CM | POA: Insufficient documentation

## 2021-08-11 DIAGNOSIS — I708 Atherosclerosis of other arteries: Secondary | ICD-10-CM | POA: Diagnosis not present

## 2021-08-11 DIAGNOSIS — I739 Peripheral vascular disease, unspecified: Secondary | ICD-10-CM | POA: Diagnosis present

## 2021-08-11 DIAGNOSIS — I10 Essential (primary) hypertension: Secondary | ICD-10-CM | POA: Diagnosis not present

## 2021-08-11 DIAGNOSIS — Z79899 Other long term (current) drug therapy: Secondary | ICD-10-CM | POA: Insufficient documentation

## 2021-08-11 DIAGNOSIS — I2510 Atherosclerotic heart disease of native coronary artery without angina pectoris: Secondary | ICD-10-CM | POA: Insufficient documentation

## 2021-08-11 DIAGNOSIS — I70212 Atherosclerosis of native arteries of extremities with intermittent claudication, left leg: Secondary | ICD-10-CM

## 2021-08-11 DIAGNOSIS — Z87891 Personal history of nicotine dependence: Secondary | ICD-10-CM | POA: Diagnosis not present

## 2021-08-11 DIAGNOSIS — E785 Hyperlipidemia, unspecified: Secondary | ICD-10-CM | POA: Insufficient documentation

## 2021-08-11 DIAGNOSIS — Z955 Presence of coronary angioplasty implant and graft: Secondary | ICD-10-CM | POA: Insufficient documentation

## 2021-08-11 DIAGNOSIS — Z7982 Long term (current) use of aspirin: Secondary | ICD-10-CM | POA: Insufficient documentation

## 2021-08-11 DIAGNOSIS — Z8249 Family history of ischemic heart disease and other diseases of the circulatory system: Secondary | ICD-10-CM | POA: Insufficient documentation

## 2021-08-11 HISTORY — PX: ABDOMINAL AORTOGRAM W/LOWER EXTREMITY: CATH118223

## 2021-08-11 HISTORY — PX: PERIPHERAL VASCULAR INTERVENTION: CATH118257

## 2021-08-11 LAB — POCT ACTIVATED CLOTTING TIME: Activated Clotting Time: 306 seconds

## 2021-08-11 SURGERY — ABDOMINAL AORTOGRAM W/LOWER EXTREMITY
Anesthesia: LOCAL

## 2021-08-11 MED ORDER — FLUTICASONE PROPIONATE 50 MCG/ACT NA SUSP: 1.0000 | Freq: Every day | NASAL | Status: DC | PRN

## 2021-08-11 MED ORDER — FUROSEMIDE 20 MG PO TABS: 20.0000 mg | ORAL_TABLET | Freq: Every day | ORAL | Status: DC | PRN

## 2021-08-11 MED ORDER — IODIXANOL 320 MG/ML IV SOLN
INTRAVENOUS | Status: DC | PRN
  Administered 2021-08-11: 220 mL

## 2021-08-11 MED ORDER — ONDANSETRON HCL 4 MG/2ML IJ SOLN
4.0000 mg | Freq: Four times a day (QID) | INTRAMUSCULAR | Status: DC | PRN
  Administered 2021-08-11: 4 mg via INTRAVENOUS

## 2021-08-11 MED ORDER — LIDOCAINE HCL (PF) 1 % IJ SOLN
INTRAMUSCULAR | Status: AC
  Filled 2021-08-11: qty 30

## 2021-08-11 MED ORDER — ASPIRIN EC 81 MG PO TBEC
81.0000 mg | DELAYED_RELEASE_TABLET | Freq: Every day | ORAL | Status: DC
  Administered 2021-08-12: 81 mg via ORAL
  Filled 2021-08-11 (×2): qty 1

## 2021-08-11 MED ORDER — SODIUM CHLORIDE 0.9% FLUSH: 3.0000 mL | INTRAVENOUS | Status: DC | PRN

## 2021-08-11 MED ORDER — LABETALOL HCL 5 MG/ML IV SOLN
10.0000 mg | INTRAVENOUS | Status: DC | PRN
  Administered 2021-08-11: 10 mg via INTRAVENOUS

## 2021-08-11 MED ORDER — LIDOCAINE HCL (PF) 1 % IJ SOLN
INTRAMUSCULAR | Status: DC | PRN
  Administered 2021-08-11 (×2): 20 mL

## 2021-08-11 MED ORDER — SODIUM CHLORIDE 0.9 % WEIGHT BASED INFUSION: 1.0000 mL/kg/h | INTRAVENOUS | Status: DC

## 2021-08-11 MED ORDER — CLOPIDOGREL BISULFATE 75 MG PO TABS
75.0000 mg | ORAL_TABLET | Freq: Every day | ORAL | Status: DC
  Administered 2021-08-12: 75 mg via ORAL
  Filled 2021-08-11: qty 1

## 2021-08-11 MED ORDER — OXCARBAZEPINE 150 MG PO TABS
300.0000 mg | ORAL_TABLET | Freq: Two times a day (BID) | ORAL | Status: DC
  Administered 2021-08-12: 300 mg via ORAL
  Filled 2021-08-11: qty 2

## 2021-08-11 MED ORDER — MIDAZOLAM HCL 5 MG/5ML IJ SOLN
INTRAMUSCULAR | Status: AC
  Filled 2021-08-11: qty 5

## 2021-08-11 MED ORDER — GABAPENTIN 300 MG PO CAPS
900.0000 mg | ORAL_CAPSULE | Freq: Three times a day (TID) | ORAL | Status: DC
  Administered 2021-08-11 – 2021-08-12 (×2): 900 mg via ORAL
  Filled 2021-08-11 (×2): qty 3

## 2021-08-11 MED ORDER — SODIUM CHLORIDE 0.9 % WEIGHT BASED INFUSION
3.0000 mL/kg/h | INTRAVENOUS | Status: DC
  Administered 2021-08-11: 3 mL/kg/h via INTRAVENOUS

## 2021-08-11 MED ORDER — SODIUM CHLORIDE 0.9% FLUSH
3.0000 mL | Freq: Two times a day (BID) | INTRAVENOUS | Status: DC
  Administered 2021-08-11 – 2021-08-12 (×2): 3 mL via INTRAVENOUS

## 2021-08-11 MED ORDER — METHOCARBAMOL 500 MG PO TABS: 500.0000 mg | ORAL_TABLET | Freq: Three times a day (TID) | ORAL | Status: DC | PRN

## 2021-08-11 MED ORDER — HYDRALAZINE HCL 20 MG/ML IJ SOLN
INTRAMUSCULAR | Status: DC | PRN
  Administered 2021-08-11: 10 mg via INTRAVENOUS

## 2021-08-11 MED ORDER — NITROGLYCERIN 1 MG/10 ML FOR IR/CATH LAB
INTRA_ARTERIAL | Status: AC
  Filled 2021-08-11: qty 10

## 2021-08-11 MED ORDER — ASPIRIN 81 MG PO CHEW: 81.0000 mg | CHEWABLE_TABLET | Freq: Every day | ORAL | Status: DC

## 2021-08-11 MED ORDER — WHITE PETROLATUM EX OINT
TOPICAL_OINTMENT | CUTANEOUS | Status: AC
  Administered 2021-08-11: 0.2
  Filled 2021-08-11: qty 28.35

## 2021-08-11 MED ORDER — ACETAMINOPHEN 325 MG PO TABS: 650.0000 mg | ORAL_TABLET | ORAL | Status: DC | PRN

## 2021-08-11 MED ORDER — ATORVASTATIN CALCIUM 80 MG PO TABS: 80.0000 mg | ORAL_TABLET | Freq: Every day | ORAL | Status: DC

## 2021-08-11 MED ORDER — ATORVASTATIN CALCIUM 80 MG PO TABS
80.0000 mg | ORAL_TABLET | Freq: Every day | ORAL | Status: DC
  Administered 2021-08-11 – 2021-08-12 (×2): 80 mg via ORAL
  Filled 2021-08-11 (×2): qty 1

## 2021-08-11 MED ORDER — FENTANYL CITRATE (PF) 100 MCG/2ML IJ SOLN
INTRAMUSCULAR | Status: DC | PRN
  Administered 2021-08-11: 25 ug via INTRAVENOUS

## 2021-08-11 MED ORDER — NITROGLYCERIN 1 MG/10 ML FOR IR/CATH LAB
INTRA_ARTERIAL | Status: DC | PRN
  Administered 2021-08-11: 200 ug via INTRA_ARTERIAL

## 2021-08-11 MED ORDER — ETODOLAC 400 MG PO TABS
400.0000 mg | ORAL_TABLET | Freq: Two times a day (BID) | ORAL | Status: DC
  Administered 2021-08-11 – 2021-08-12 (×3): 400 mg via ORAL
  Filled 2021-08-11 (×4): qty 1

## 2021-08-11 MED ORDER — SODIUM CHLORIDE 0.9 % IV SOLN: 250.0000 mL | INTRAVENOUS | Status: AC | PRN

## 2021-08-11 MED ORDER — METOPROLOL TARTRATE 50 MG PO TABS
50.0000 mg | ORAL_TABLET | Freq: Two times a day (BID) | ORAL | Status: DC
  Administered 2021-08-11: 50 mg via ORAL
  Filled 2021-08-11 (×3): qty 1

## 2021-08-11 MED ORDER — LISINOPRIL 40 MG PO TABS
40.0000 mg | ORAL_TABLET | Freq: Every day | ORAL | Status: DC
  Administered 2021-08-11: 40 mg via ORAL
  Filled 2021-08-11 (×2): qty 1

## 2021-08-11 MED ORDER — HYDRALAZINE HCL 20 MG/ML IJ SOLN
INTRAMUSCULAR | Status: AC
  Filled 2021-08-11: qty 1

## 2021-08-11 MED ORDER — HEPARIN (PORCINE) IN NACL 1000-0.9 UT/500ML-% IV SOLN
INTRAVENOUS | Status: AC
  Filled 2021-08-11: qty 1000

## 2021-08-11 MED ORDER — CLOPIDOGREL BISULFATE 300 MG PO TABS
ORAL_TABLET | ORAL | Status: AC
  Filled 2021-08-11: qty 1

## 2021-08-11 MED ORDER — SPIRONOLACTONE 25 MG PO TABS: 50.0000 mg | ORAL_TABLET | Freq: Once | ORAL | Status: DC

## 2021-08-11 MED ORDER — CLOPIDOGREL BISULFATE 300 MG PO TABS
ORAL_TABLET | ORAL | Status: DC | PRN
  Administered 2021-08-11: 300 mg via ORAL

## 2021-08-11 MED ORDER — SODIUM CHLORIDE 0.9 % IV SOLN
INTRAVENOUS | Status: AC
  Administered 2021-08-11: 1000 mL via INTRAVENOUS

## 2021-08-11 MED ORDER — BACLOFEN 10 MG PO TABS: 10.0000 mg | ORAL_TABLET | Freq: Four times a day (QID) | ORAL | Status: DC | PRN

## 2021-08-11 MED ORDER — FENTANYL CITRATE (PF) 100 MCG/2ML IJ SOLN
INTRAMUSCULAR | Status: AC
  Filled 2021-08-11: qty 2

## 2021-08-11 MED ORDER — SPIRONOLACTONE 25 MG PO TABS
50.0000 mg | ORAL_TABLET | Freq: Every day | ORAL | Status: DC
  Administered 2021-08-12: 50 mg via ORAL
  Filled 2021-08-11: qty 2

## 2021-08-11 MED ORDER — TRAMADOL HCL 50 MG PO TABS
50.0000 mg | ORAL_TABLET | Freq: Four times a day (QID) | ORAL | Status: DC | PRN
  Administered 2021-08-11 (×2): 50 mg via ORAL
  Filled 2021-08-11 (×2): qty 1

## 2021-08-11 MED ORDER — OXYBUTYNIN CHLORIDE ER 10 MG PO TB24
10.0000 mg | ORAL_TABLET | Freq: Every day | ORAL | Status: DC
  Administered 2021-08-11: 10 mg via ORAL
  Filled 2021-08-11 (×2): qty 1

## 2021-08-11 MED ORDER — HYDRALAZINE HCL 20 MG/ML IJ SOLN
5.0000 mg | INTRAMUSCULAR | Status: DC | PRN
  Administered 2021-08-11: 5 mg via INTRAVENOUS

## 2021-08-11 MED ORDER — ONDANSETRON HCL 4 MG/2ML IJ SOLN
INTRAMUSCULAR | Status: AC
  Filled 2021-08-11: qty 2

## 2021-08-11 MED ORDER — SODIUM CHLORIDE 0.9 % IV SOLN
INTRAVENOUS | Status: DC | PRN
  Administered 2021-08-11: .25 mg/kg/h via INTRAVENOUS

## 2021-08-11 MED ORDER — SPIRONOLACTONE 25 MG PO TABS
25.0000 mg | ORAL_TABLET | Freq: Every day | ORAL | Status: DC
  Administered 2021-08-11: 25 mg via ORAL
  Filled 2021-08-11: qty 1

## 2021-08-11 MED ORDER — ASPIRIN 81 MG PO CHEW
81.0000 mg | CHEWABLE_TABLET | ORAL | Status: AC
  Administered 2021-08-11: 81 mg via ORAL
  Filled 2021-08-11: qty 1

## 2021-08-11 MED ORDER — SODIUM CHLORIDE 0.9 % IV SOLN: 250.0000 mL | INTRAVENOUS | Status: DC | PRN

## 2021-08-11 MED ORDER — OXCARBAZEPINE 150 MG PO TABS
600.0000 mg | ORAL_TABLET | Freq: Every day | ORAL | Status: DC
  Administered 2021-08-11: 600 mg via ORAL
  Filled 2021-08-11: qty 4

## 2021-08-11 MED ORDER — BIVALIRUDIN TRIFLUOROACETATE 250 MG IV SOLR
INTRAVENOUS | Status: AC
  Filled 2021-08-11: qty 250

## 2021-08-11 MED ORDER — AMLODIPINE BESYLATE 10 MG PO TABS
10.0000 mg | ORAL_TABLET | Freq: Every day | ORAL | Status: DC
  Administered 2021-08-11 – 2021-08-12 (×2): 10 mg via ORAL
  Filled 2021-08-11 (×2): qty 1

## 2021-08-11 MED ORDER — VENLAFAXINE HCL ER 75 MG PO CP24
75.0000 mg | ORAL_CAPSULE | Freq: Every day | ORAL | Status: DC
  Administered 2021-08-12: 75 mg via ORAL
  Filled 2021-08-11: qty 1

## 2021-08-11 MED ORDER — MIDAZOLAM HCL 2 MG/2ML IJ SOLN
INTRAMUSCULAR | Status: DC | PRN
  Administered 2021-08-11: 1 mg via INTRAVENOUS

## 2021-08-11 MED ORDER — LABETALOL HCL 5 MG/ML IV SOLN
INTRAVENOUS | Status: AC
  Filled 2021-08-11: qty 4

## 2021-08-11 MED ORDER — BIVALIRUDIN BOLUS VIA INFUSION - CUPID
INTRAVENOUS | Status: DC | PRN
  Administered 2021-08-11: 45.9 mg via INTRAVENOUS

## 2021-08-11 SURGICAL SUPPLY — 22 items
BALLN MUSTANG 5.0X20 75 (BALLOONS) ×3
BALLN MUSTANG 7.0X40 75 (BALLOONS) ×3
BALLOON MUSTANG 5.0X20 75 (BALLOONS) ×2 IMPLANT
BALLOON MUSTANG 7.0X40 75 (BALLOONS) ×2 IMPLANT
CATH ANGIO 5F PIGTAIL 65CM (CATHETERS) ×3 IMPLANT
CATH STRAIGHT 5FR 65CM (CATHETERS) ×3 IMPLANT
KIT ENCORE 26 ADVANTAGE (KITS) ×3 IMPLANT
KIT PV (KITS) ×3 IMPLANT
SHEATH BRITE TIP 6FR 35CM (SHEATH) ×3 IMPLANT
SHEATH PINNACLE 5F 10CM (SHEATH) ×3 IMPLANT
SHEATH PINNACLE 6F 10CM (SHEATH) ×3 IMPLANT
SHEATH PROBE COVER 6X72 (BAG) ×3 IMPLANT
STENT ABSOLUTE PRO 9X60X135 (Permanent Stent) ×3 IMPLANT
STOPCOCK MORSE 400PSI 3WAY (MISCELLANEOUS) ×3 IMPLANT
SYR MEDRAD MARK 7 150ML (SYRINGE) ×3 IMPLANT
TAPE VIPERTRACK RADIOPAQ (MISCELLANEOUS) ×2 IMPLANT
TAPE VIPERTRACK RADIOPAQUE (MISCELLANEOUS) ×1
TRANSDUCER W/STOPCOCK (MISCELLANEOUS) ×3 IMPLANT
TRAY PV CATH (CUSTOM PROCEDURE TRAY) ×3 IMPLANT
TUBING CIL FLEX 10 FLL-RA (TUBING) ×3 IMPLANT
WIRE HITORQ VERSACORE ST 145CM (WIRE) ×6 IMPLANT
WIRE VERSACORE LOC 115CM (WIRE) ×3 IMPLANT

## 2021-08-11 NOTE — Progress Notes (Signed)
SITE AREA:left groin/femoral  SITE PRIOR TO REMOVAL:  LEVEL 0   PRESSURE APPLIED FOR: approximately 20 minutes, removed by Oletta Darter, RN 6E RN  MANUAL: yes  PATIENT STATUS DURING PULL: stable  POST PULL SITE:  LEVEL 0  POST PULL INSTRUCTIONS GIVEN: yes  POST PULL PULSES PRESENT: bilateral pedal pulses at +2  DRESSING APPLIED: gauze with tegaderm  BEDREST BEGINS @ 1303   COMMENTS:

## 2021-08-11 NOTE — Progress Notes (Signed)
SITE AREA: right groin/femoral  SITE PRIOR TO REMOVAL:  LEVEL 0  PRESSURE APPLIED FOR: approximately 20 minutes  MANUAL: yes  PATIENT STATUS DURING PULL: stable  POST PULL SITE:  LEVEL 0  POST PULL INSTRUCTIONS GIVEN: yes  POST PULL PULSES PRESENT: bilateral pedal pulses at +2  DRESSING APPLIED: gauze with tegaderm   BEDREST BEGINS @ after 2nd sheath removal  COMMENTS:

## 2021-08-11 NOTE — Plan of Care (Signed)

## 2021-08-11 NOTE — Interval H&P Note (Signed)
History and Physical Interval Note:  08/11/2021 8:00 AM  Anna Henson  has presented today for surgery, with the diagnosis of pad.  The various methods of treatment have been discussed with the patient and family. After consideration of risks, benefits and other options for treatment, the patient has consented to  Procedure(s): ABDOMINAL AORTOGRAM W/LOWER EXTREMITY (N/A) as a surgical intervention.  The patient's history has been reviewed, patient examined, no change in status, stable for surgery.  I have reviewed the patient's chart and labs.  Questions were answered to the patient's satisfaction.     Quay Burow

## 2021-08-12 ENCOUNTER — Other Ambulatory Visit (HOSPITAL_COMMUNITY): Payer: Self-pay

## 2021-08-12 ENCOUNTER — Encounter (HOSPITAL_COMMUNITY): Payer: Self-pay | Admitting: Cardiovascular Disease

## 2021-08-12 DIAGNOSIS — I739 Peripheral vascular disease, unspecified: Secondary | ICD-10-CM | POA: Diagnosis not present

## 2021-08-12 LAB — BASIC METABOLIC PANEL
Anion gap: 10 (ref 5–15)
BUN: 12 mg/dL (ref 6–20)
CO2: 19 mmol/L — ABNORMAL LOW (ref 22–32)
Calcium: 9.5 mg/dL (ref 8.9–10.3)
Chloride: 105 mmol/L (ref 98–111)
Creatinine, Ser: 0.99 mg/dL (ref 0.44–1.00)
GFR, Estimated: >60 mL/min (ref >60)
Glucose, Bld: 80 mg/dL (ref 70–99)
Potassium: 4.9 mmol/L (ref 3.5–5.1)
Sodium: 134 mmol/L — ABNORMAL LOW (ref 135–145)

## 2021-08-12 LAB — CBC
HCT: 33.7 % — ABNORMAL LOW (ref 36.0–46.0)
Hemoglobin: 11.7 g/dL — ABNORMAL LOW (ref 12.0–15.0)
MCH: 31.4 pg (ref 26.0–34.0)
MCHC: 34.7 g/dL (ref 30.0–36.0)
MCV: 90.3 fL (ref 80.0–100.0)
Platelets: 341 10*3/uL (ref 150–400)
RBC: 3.73 MIL/uL — ABNORMAL LOW (ref 3.87–5.11)
RDW: 13.9 % (ref 11.5–15.5)
WBC: 8.3 10*3/uL (ref 4.0–10.5)
nRBC: 0 % (ref 0.0–0.2)

## 2021-08-12 MED ORDER — CLOPIDOGREL BISULFATE 75 MG PO TABS
75.0000 mg | ORAL_TABLET | Freq: Every day | ORAL | 1 refills | Status: DC
  Filled 2021-08-12: qty 30, 30d supply, fill #0

## 2021-08-12 MED FILL — Heparin Sod (Porcine)-NaCl IV Soln 1000 Unit/500ML-0.9%: INTRAVENOUS | Qty: 1000 | Status: AC

## 2021-08-12 NOTE — Discharge Summary (Addendum)
The patient has been seen in conjunction with Anna Bellis, NP-C. All aspects of care have been considered and discussed. The patient has been personally interviewed, examined, and all clinical data has been reviewed.  Bilateral femoral access sites without hematoma or tenderness. She is ambulated without difficulty. Hemoglobin and creatinine are stable. Discharged today with follow-up as outlined by Dr. Gwenlyn Found, please see below.  Discharge Summary    Patient ID: Anna Henson MRN: [id number]; DOB: 01-23-1971  Admit date: 08/11/2021 Discharge date: 08/12/2021  PCP:  Merryl Hacker, No   [facility] Providers Cardiologist:  Pixie Casino, MD     Discharge Diagnoses    Active Problems:   Peripheral arterial disease ([place])   Claudication in peripheral vascular disease [facility])    Diagnostic Studies/Procedures    PV angiogram: 08/11/21  Final Impression: Successful distal left common iliac artery PTA and self-expanding stenting for lifestyle limiting claudication.  The patient did receive 300 mg of p.o. clopidogrel.  The sheath will be removed in 2 to 3 hours and pressure held.  Patient will be gently hydrated and observed overnight.  She will be discharged home tomorrow morning.  We will get lower extremity arterial Doppler studies in our [facility] line office next week and I will see her back the week after and follow-up.  She left the lab in stable condition.    Quay Burow. MD, [facility] 08/11/2021 _____________   History of Present Illness     Anna Henson is a 50 y.o. female with PMH of prior tobacco use, HTN, HLD, CAD s/p RCA stenting in '15 and '18 who was referred to Dr. Gwenlyn Found by Dr. Debara Pickett for evaluation of renal vascular hypertension. She had renal Doppler studies performed in our office 10/15/2020 that suggested mild to moderate left renal artery stenosis with a renal aortic ratio of 3.58 and subsequent CTA performed 11/19/2020 that showed moderate left renal artery stenosis in the 50%  range which was confirmatory.  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  She has a lower extremity arterial Doppler studies that revealed a right ABI of 0.92 and a left of 0.88.  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  She complained of pain both at rest and with ambulation on the right and the left side but she attributes the right side symptoms to her previous transverse myelitis.  She wished to proceed with angiography and endovascular therapy on the left.   Hospital Course     Underwent PV angiogram with Dr. Gwenlyn Found on 11/3 with successful distal left common iliac artery PTA and self-expanding stenting for lifestyle limiting claudication.  Started on DAPT with ASA/plavix. No complications noted overnight. Able to ambulate without issues. Morning labs were stable. Will need to stop etodolac with the need for DAPT.   General: Well developed, well nourished, female appearing in no acute distress. Head: Normocephalic, atraumatic.  Neck: Supple without bruits, JVD. Lungs:  Resp regular and unlabored, CTA. Heart: RRR, S1, S2, no S3, S4, or murmur; no rub. Abdomen: Soft, non-tender, non-distended with normoactive bowel sounds. No hepatomegaly. No rebound/guarding. No obvious abdominal masses. Extremities: No clubbing, cyanosis, edema. Distal pedal pulses are 2+ bilaterally. R/L femoral cath site stable without bruising or hematoma Neuro: Alert and oriented X 3. Moves all extremities spontaneously. Psych: Normal affect.   Did the patient have an acute coronary syndrome (MI, NSTEMI, STEMI, etc)  this admission?:  No                               Did the patient have a percutaneous coronary intervention (stent / angioplasty)?:  No.    _____________  Discharge Vitals Blood pressure 115/72, pulse 68, temperature 98.7 F (37.1 C), temperature source Oral, resp. rate 15,  height 5' 1.5" (1.562 m), weight 61.2 kg, last menstrual period 03/06/2020, SpO2 99 %.  Filed Weights   08/11/21 0600  Weight: 61.2 kg    Labs & Radiologic Studies    CBC Recent Labs    08/12/21 0407  WBC 8.3  HGB 11.7*  HCT 33.7*  MCV 90.3  PLT 960   Basic Metabolic Panel Recent Labs    08/12/21 0407  NA 134*  K 4.9  CL 105  CO2 19*  GLUCOSE 80  BUN 12  CREATININE 0.99  CALCIUM 9.5   Liver Function Tests No results for input(s): AST, ALT, ALKPHOS, BILITOT, PROT, ALBUMIN in the last 72 hours. No results for input(s): LIPASE, AMYLASE in the last 72 hours. High Sensitivity Troponin:   No results for input(s): TROPONINIHS in the last 720 hours.  BNP Invalid input(s): POCBNP D-Dimer No results for input(s): DDIMER in the last 72 hours. Hemoglobin A1C No results for input(s): HGBA1C in the last 72 hours. Fasting Lipid Panel No results for input(s): CHOL, HDL, LDLCALC, TRIG, CHOLHDL, LDLDIRECT in the last 72 hours. Thyroid Function Tests No results for input(s): TSH, T4TOTAL, T3FREE, THYROIDAB in the last 72 hours.  Invalid input(s): FREET3 _____________  PERIPHERAL VASCULAR CATHETERIZATION  Result Date: 08/11/2021 Images from the original result were not included.  [id number] LOCATION:  FACILITY: [facility] PHYSICIAN: Quay Burow, M.D. Apr 06, 1971 DATE OF PROCEDURE:  08/11/2021 DATE OF DISCHARGE: PV Angiogram/Intervention History obtained from chart review. Anna Henson is a 50 y.o.  mild to moderately overweight single African-American female with no children referred to me by Dr. Debara Pickett for evaluation of potential renal vascular hypertension.  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  I last saw her in the office 06/17/2021.Marland Kitchen  Her cardiac risk factors are notable for discontinue tobacco abuse having smoked approximately 12 pack years and discontinued in 2015.  She has treated hypertension and hyperlipidemia.  Her mother had myocardial infarction and  stents.  She has had a heart attack in the past and has had RCA stenting 09/14/2014 and again 12/31/2016.  She has had hypertension for many years and has had several admissions for hypertensive urgency.  She had renal Doppler studies performed in our office 10/15/2020 that suggested mild to moderate left renal artery stenosis with a renal aortic ratio of 3.58 and subsequent CTA performed 11/19/2020 that showed moderate left renal artery stenosis in the 50% range which was confirmatory.  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 Since I saw her 6 weeks ago I did get lower extremity arterial Doppler studies that revealed a right ABI of 0.92 and a left of 0.88.  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  She complains of pain both at rest and with ambulation on the right and the left side but she attributes the right side symptoms to her previous transverse myelitis.  She wishes to proceed with angiography and endovascular therapy on the left. Pre Procedure  Diagnosis:  Peripheral arterial disease Post Procedure Diagnosis: Peripheral arterial disease Operators: Dr. Quay Burow Procedures Performed:  1.  Ultrasound-guided right and left common femoral access  2.  Abdominal aortogram/bilateral iliac angiogram/bifemoral runoff  3.  PTA and stent distal left common iliac artery  4.  Right common femoral angiogram PROCEDURE DESCRIPTION: The patient was brought to the second floor [facility] cath lab in the the postabsorptive state. She was premedicated with IV Versed and fentanyl. Her right and left groins were prepped and shaved in usual sterile fashion. Xylocaine 1% was used for local anesthesia. A 5 French sheath was inserted into the right common femoral artery using standard Seldinger technique.  A 6 French Brite tip sheath was inserted into the left common femoral artery using standard  Seldinger technique.  Ultrasound was used to identify the right and left common femoral arteries and guide access.  Digital images were captured and placed in the patient's chart.  A 5 French pigtail catheters placed in the distal abdominal aorta.  Distal abdominal aortography, bilateral iliac angiography with bifemoral runoff was performed using bolus chase, digital subtraction and step table technique.  Omnipaque dye was used for the entirety of the case (220 cc total administered to patient for diagnostic intervention).  Retrograde aortic pressures monitored in the case.  Angiographic Data: 1: Abdominal aorta-small in caliber, 40% proximal left renal artery stenosis 2: Left lower extremity-80% distal left common iliac artery stenosis, proximal left external iliac artery stenosis with a 60 mm pullback gradient after administration of 200 mcg of intra arterial nitroglycerin using a 5 French endhole catheter for pullback.  It should be noted that there was poststenotic dilatation in the left external iliac artery.  There was a 90 % mid left SFA stenosis with three-vessel runoff. 3: Right lower extremity-no significant obstruction with three-vessel runoff   High-grade physiologically significant distal left common/proximal left external iliac artery stenosis with a 60 mm pullback gradient.  This is probably contributing to her symptoms in addition to the downstream SFA stenosis.  We will proceed with iliac intervention using a nitinol self-expanding stent to preserve the hypogastric artery. Procedure Description: The patient received Angiomax bolus followed by infusion with an ACT of 306.  She was not able to take pork related products because of religious believes.  I predilated the lesion with a 5 mm millimeter by 2 cm balloon over an 035 versa core wire.  I then carefully positioned a 9 mm x 60 mm long Abbott absolute Pro nitinol self-expanding stent in the mid left common iliac artery, across the lesion and  into the dilated left extrailiac artery.  There appeared to be good apposition in the external iliac artery.  I postdilated the lesion with a 7 mm x 4 cm balloon at 12 atm resulting reduction of an 80% stenosis to less than 20% residual.  I did not feel comfortable going up to an 8 mm balloon.  The patient tolerated the procedure well.  The Brite tip sheath was then exchanged over wire for a short 6 French sheath which was secured in place.  I did take a right femoral angiogram that revealed a slightly high stick just below the inferior hypogastric artery and therefore I did not deploy a Mynx closure device. Final Impression: Successful distal left common iliac artery PTA and self-expanding stenting for lifestyle limiting claudication.  The patient did receive 300 mg of p.o. clopidogrel.  The sheath will be removed in 2 to 3 hours and  pressure held.  Patient will be gently hydrated and observed overnight.  She will be discharged home tomorrow morning.  We will get lower extremity arterial Doppler studies in our [facility] line office next week and I will see her back the week after and follow-up.  She left the lab in stable condition. Quay Burow. MD, [facility] 08/11/2021 9:58 AM    VAS [place] LE ART SEG MULTI (Segm&LE Reynauds)  Result Date: 07/18/2021  LOWER EXTREMITY DOPPLER STUDY Patient Name:  KAISEY HUSEBY  Date of Exam:   07/18/2021 Medical Rec #: [id number]      Accession #:    [id number] Date of Birth: 22-May-1971      Patient Gender: F Patient Age:   60 years Exam Location:  [facility] Procedure:      VAS [place] LOWER EXT ART SEG MULTI (SEGMENTALS & LE RAYNAUDS) Referring Phys: Roderic Palau BERRY --------------------------------------------------------------------------------  Indications: Claudication, peripheral artery disease, and Patient has had              bilateral calf pain and burning after walking about 5 minutes for              the last 5 years. She denies rest pain. High Risk Factors: Hypertension,  hyperlipidemia, past history of smoking,                    coronary artery disease. Other Factors: SEE BILATERAL LEG ARTERIAL AND AORTOILIAC DUPLEX REPORTS.  Comparison Study: None Performing Technologist: Salvadore Dom RVT Supporting Technologist: Leavy Cella RDCS  Examination Guidelines: A complete evaluation includes at minimum, Doppler waveform signals and systolic blood pressure reading at the level of bilateral brachial, anterior tibial, and posterior tibial arteries, when vessel segments are accessible. Bilateral testing is considered an integral part of a complete examination. Photoelectric Plethysmograph (PPG) waveforms and toe systolic pressure readings are included as required and additional duplex testing as needed. Limited examinations for reoccurring indications may be performed as noted.  ABI Findings: +---------+------------------+-----+----------+--------+ Right    Rt Pressure (mmHg)IndexWaveform  Comment  +---------+------------------+-----+----------+--------+ Brachial 154                                       +---------+------------------+-----+----------+--------+ CFA                             triphasic          +---------+------------------+-----+----------+--------+ Popliteal                       biphasic           +---------+------------------+-----+----------+--------+ PTA      134               0.86 monophasic         +---------+------------------+-----+----------+--------+ PERO     144                    biphasic  0.92     +---------+------------------+-----+----------+--------+ DP       130               0.83 monophasic         +---------+------------------+-----+----------+--------+ Great Toe141               0.90 Normal             +---------+------------------+-----+----------+--------+ +---------+------------------+-----+----------+-------+ Left  Lt Pressure (mmHg)IndexWaveform  Comment  +---------+------------------+-----+----------+-------+ Brachial 156                                      +---------+------------------+-----+----------+-------+ CFA                             biphasic          +---------+------------------+-----+----------+-------+ Popliteal                       biphasic          +---------+------------------+-----+----------+-------+ PTA      135               0.87 monophasic        +---------+------------------+-----+----------+-------+ PERO                            monophasic0.86    +---------+------------------+-----+----------+-------+ DP       128               0.82 biphasic          +---------+------------------+-----+----------+-------+ Great Toe126               0.81 Normal            +---------+------------------+-----+----------+-------+ +-------+-----------+-----------+------------+------------+ ABI/TBIToday's ABIToday's TBIPrevious ABIPrevious TBI +-------+-----------+-----------+------------+------------+ Right  .92        .90                                 +-------+-----------+-----------+------------+------------+ Left   .88        .81                                 +-------+-----------+-----------+------------+------------+   Summary: Right: Resting right ankle-brachial index indicates mild right lower extremity arterial disease. The right toe-brachial index is normal. Left: Resting left ankle-brachial index indicates mild left lower extremity arterial disease. The left toe-brachial index is normal.  *See table(s) above for measurements and observations.  Electronically signed by Larae Grooms MD on 07/18/2021 at 6:53:26 PM.    Final    VAS [place] LOWER EXTREMITY ARTERIAL DUPLEX  Result Date: 07/18/2021 LOWER EXTREMITY ARTERIAL DUPLEX STUDY Patient Name:  BATINA DOUGAN  Date of Exam:   07/18/2021 Medical Rec #: [id number]      Accession #:    [id number] Date of Birth: 1971-09-07      Patient  Gender: F Patient Age:   68 years Exam Location:  [facility] Procedure:      VAS [place] LOWER EXTREMITY ARTERIAL DUPLEX Referring Phys: Roderic Palau BERRY --------------------------------------------------------------------------------  Indications: Claudication, peripheral artery disease, and Patient has had              bilateral calf pain and burning after walking about 5 minutes for              the last 5 years. She denies rest pain. High Risk Factors: Hypertension, hyperlipidemia, past history of smoking,                    coronary artery disease. Other Factors: SEE ABI AND AORTOILIAC DUPLEX REPORTS.  Vascular Interventions: None. Current ABI:  Right .92 Left .87 Comparison Study: None Performing Technologist: Alecia Mackin RVT, RDCS (AE), RDMS  Examination Guidelines: A complete evaluation includes B-mode imaging, spectral Doppler, color Doppler, and power Doppler as needed of all accessible portions of each vessel. Bilateral testing is considered an integral part of a complete examination. Limited examinations for reoccurring indications may be performed as noted.  +-----------+--------+-----+---------------+---------+-----------------+ RIGHT      PSV cm/sRatioStenosis       Waveform Comments          +-----------+--------+-----+---------------+---------+-----------------+ CFA Prox   86                          biphasic                   +-----------+--------+-----+---------------+---------+-----------------+ CFA Distal 122                         triphasic                  +-----------+--------+-----+---------------+---------+-----------------+ DFA        95                          biphasic                   +-----------+--------+-----+---------------+---------+-----------------+ SFA Prox   190          30-49% stenosisbiphasic                   +-----------+--------+-----+---------------+---------+-----------------+ SFA Mid    188                         biphasic  homogenous plaque +-----------+--------+-----+---------------+---------+-----------------+ SFA Distal 129                         biphasic                   +-----------+--------+-----+---------------+---------+-----------------+ POP Prox   81                          biphasic                   +-----------+--------+-----+---------------+---------+-----------------+ POP Distal 53                          biphasic                   +-----------+--------+-----+---------------+---------+-----------------+ TP Trunk   90                          biphasic                   +-----------+--------+-----+---------------+---------+-----------------+ ATA Prox   172          30-49% stenosisbiphasic                   +-----------+--------+-----+---------------+---------+-----------------+ ATA Distal 59                                                     +-----------+--------+-----+---------------+---------+-----------------+ PTA Prox   106  biphasic                   +-----------+--------+-----+---------------+---------+-----------------+ PTA Distal 48                          biphasic                   +-----------+--------+-----+---------------+---------+-----------------+ PERO Prox  24                          biphasic                   +-----------+--------+-----+---------------+---------+-----------------+ PERO Distal24                          biphasic                   +-----------+--------+-----+---------------+---------+-----------------+ A focal velocity elevation of 190 cm/s was obtained at PRX SFA with a VR of 1.3. Findings are characteristic of 30-49% stenosis. A 2nd focal velocity elevation was visualized, measuring 172 cm/s at PRX ATA with a VR of 1.9. Findings are characteristic of  30-49% stenosis.  +-----------+--------+-----+---------------+----------+--------+ LEFT       PSV cm/sRatioStenosis       Waveform   Comments +-----------+--------+-----+---------------+----------+--------+ CFA Prox   86                          biphasic           +-----------+--------+-----+---------------+----------+--------+ CFA Distal 55                          biphasic           +-----------+--------+-----+---------------+----------+--------+ DFA        145                         biphasic           +-----------+--------+-----+---------------+----------+--------+ SFA Prox   52                          biphasic           +-----------+--------+-----+---------------+----------+--------+ SFA Mid    355          50-74% stenosisbiphasic           +-----------+--------+-----+---------------+----------+--------+ SFA Distal 104                         biphasic           +-----------+--------+-----+---------------+----------+--------+ POP Prox   72                          biphasic           +-----------+--------+-----+---------------+----------+--------+ POP Distal 44                          biphasic           +-----------+--------+-----+---------------+----------+--------+ TP Trunk   46                          biphasic           +-----------+--------+-----+---------------+----------+--------+ ATA Prox   46  monophasic         +-----------+--------+-----+---------------+----------+--------+ ATA Distal 42                          monophasic         +-----------+--------+-----+---------------+----------+--------+ PTA Prox   45                          biphasic           +-----------+--------+-----+---------------+----------+--------+ PTA Distal 28                          biphasic           +-----------+--------+-----+---------------+----------+--------+ PERO Prox  20                          biphasic           +-----------+--------+-----+---------------+----------+--------+ PERO Distal13                           monophasic         +-----------+--------+-----+---------------+----------+--------+ A focal velocity elevation of 355 cm/s was obtained at MID/DST SFA with a VR of 3.8. Findings are characteristic of 50-74% stenosis.  Summary: Right: 30-49% stenosis noted in the superficial femoral artery. 30-49% stenosis noted in the anterior tibial artery. Heterogenous plaque seen throughout extremity, areas of shadowing seen and can not exclude higher grade stenosis versus occlusion within. Left: 50-74% stenosis noted in the superficial femoral artery. Heterogenous plaque seen throughout extremity, areas of shadowing seen and can not exclude higher grade stenosis versus occlusion within.  See table(s) above for measurements and observation Patient scheduled to see Dr. Gwenlyn Found 07/29/2021 at 4:30 pm. Vascular consult recommended. Electronically signed by Larae Grooms MD on 07/18/2021 at 6:55:21 PM.    Final    VAS US AORTA/IVC/ILIACS  Result Date: 07/18/2021 ABDOMINAL AORTA STUDY Patient Name:  KIEREN RICCI  Date of Exam:   07/18/2021 Medical Rec #: [id number]      Accession #:    [id number] Date of Birth: 1971/03/25      Patient Gender: F Patient Age:   58 years Exam Location:  [facility] Procedure:      VAS US AORTA/IVC/ILIACS Referring Phys: Quay Burow --------------------------------------------------------------------------------  Indications: Patient has had bilateral calf pain and burning after walking about              5 minutes for the last 5 years. She denies rest pain Risk Factors: Hypertension, hyperlipidemia, past history of smoking, prior MI,               coronary artery disease. Other Factors: SEE ABI AND BILATERAL LEG ARTERIAL DUPLEX REPORTS                 Today's ABI                Right .92 Left .88.  Comparison Study: CTA 12/17/2020 showed a focal high-grade distal left common                   iliac artery stenosis Performing Technologist: Salvadore Dom RVT, [place] (AE), RDMS Supporting  Technologist: Leavy Cella RDCS  Examination Guidelines: A complete evaluation includes B-mode imaging, spectral Doppler, color Doppler, and power Doppler as needed of all accessible portions of each vessel. Bilateral testing is considered an integral part  of a complete examination. Limited examinations for reoccurring indications may be performed as noted.  Abdominal Aorta Findings: +-------------+-------+----------+---------+----------+--------+---------------+ Location     AP (cm)Trans (cm)PSV      Waveform  ThrombusComments                                      (cm/s)                                     +-------------+-------+----------+---------+----------+--------+---------------+ Proximal     2.60   2.90      68       monophasic                        +-------------+-------+----------+---------+----------+--------+---------------+ Mid          1.80   2.20                                                 +-------------+-------+----------+---------+----------+--------+---------------+ Distal       1.40   1.60      52       biphasic                          +-------------+-------+----------+---------+----------+--------+---------------+ RT CIA Prox                   144      biphasic                          +-------------+-------+----------+---------+----------+--------+---------------+ RT CIA Mid                    148      biphasic                          +-------------+-------+----------+---------+----------+--------+---------------+ RT CIA Distal                 141      biphasic                          +-------------+-------+----------+---------+----------+--------+---------------+ RT EIA Prox                   106      biphasic                          +-------------+-------+----------+---------+----------+--------+---------------+ RT EIA Mid                    147      biphasic                           +-------------+-------+----------+---------+----------+--------+---------------+ RT EIA Distal                 117      biphasic                          +-------------+-------+----------+---------+----------+--------+---------------+ LT CIA Prox  78       monophasic                        +-------------+-------+----------+---------+----------+--------+---------------+ LT CIA Mid                    103      biphasic                          +-------------+-------+----------+---------+----------+--------+---------------+ LT CIA Distal                 501      biphasic          > 50 % stenosis +-------------+-------+----------+---------+----------+--------+---------------+ LT EIA Prox                   303      monophasic        > 50 % stenosis +-------------+-------+----------+---------+----------+--------+---------------+ LT EIA Mid                    85       monophasic                        +-------------+-------+----------+---------+----------+--------+---------------+ LT EIA Distal                 57       monophasic                        +-------------+-------+----------+---------+----------+--------+---------------+ IVC/Iliac Findings: +--------+------+--------+--------+   IVC   PatentThrombusComments +--------+------+--------+--------+ IVC Proxpatent                 +--------+------+--------+--------+    Summary: Abdominal Aorta: No evidence of an abdominal aortic aneurysm was visualized. The largest aortic measurement is 2.9 cm. Severe atherosclerosis seen throughout aorta. Stenosis: +-------------------+-------------+ Location           Stenosis      +-------------------+-------------+ Left Common Iliac  >50% stenosis +-------------------+-------------+ Left External Iliac>50% stenosis +-------------------+-------------+ Severe atherosclerosis noted throughout bilateral iliac arteries. IVC/Iliac: There is no  evidence of thrombus involving the IVC.  *See table(s) above for measurements and observations. Patient scheduled to see Dr. Gwenlyn Found 07/29/2021 at 4:30pm Vascular consult recommended.  Electronically signed by Larae Grooms MD on 07/18/2021 at 6:54:09 PM.    Final    Disposition   Pt is being discharged home today in good condition.  Follow-up Plans & Appointments     Follow-up Information     [facility] Follow up on 08/19/2021.   Specialty: Cardiology Why: at 10am for your follow up dopplers Contact information: [address] [place] [phone]        Lorretta Harp, MD Follow up on 08/26/2021.   Specialties: Cardiology, Radiology Why: at [facility] for your follow up appt with Dr. Pearla Dubonnet information: [address] [place] [address] [phone]                Discharge Instructions     Call MD for:  redness, tenderness, or signs of infection (pain, swelling, redness, odor or green/yellow discharge around incision site)   Complete by: As directed    Diet - low sodium heart healthy   Complete by: As directed    Discharge instructions   Complete by: As directed    Groin Site Care Refer to this sheet  in the next few weeks. These instructions provide you with information on caring for yourself after your procedure. Your caregiver may also give you more specific instructions. Your treatment has been planned according to current medical practices, but problems sometimes occur. Call your caregiver if you have any problems or questions after your procedure. HOME CARE INSTRUCTIONS You may shower 24 hours after the procedure. Remove the bandage (dressing) and gently wash the site with plain soap and water. Gently pat the site dry.  Do not apply powder or lotion to the site.  Do not sit in a bathtub, swimming pool, or whirlpool for 5 to 7 days.  No bending, squatting, or lifting anything over 10  pounds (4.5 kg) as directed by your caregiver.  Inspect the site at least twice daily.  Do not drive home if you are discharged the same day of the procedure. Have someone else drive you.  You may drive 24 hours after the procedure unless otherwise instructed by your caregiver.  What to expect: Any bruising will usually fade within 1 to 2 weeks.  Blood that collects in the tissue (hematoma) may be painful to the touch. It should usually decrease in size and tenderness within 1 to 2 weeks.  SEEK IMMEDIATE MEDICAL CARE IF: You have unusual pain at the groin site or down the affected leg.  You have redness, warmth, swelling, or pain at the groin site.  You have drainage (other than a small amount of blood on the dressing).  You have chills.  You have a fever or persistent symptoms for more than 72 hours.  You have a fever and your symptoms suddenly get worse.  Your leg becomes pale, cool, tingly, or numb.  You have heavy bleeding from the site. Hold pressure on the site. Marland Kitchen  PLEASE DO NOT MISS ANY DOSES OF YOUR PLAVIX!!!!! Also keep a log of you blood pressures and bring back to your follow up appt. Please call the office with any questions.   Patients taking blood thinners should generally stay away from medicines like ibuprofen, Advil, Motrin, naproxen, and Aleve due to risk of stomach bleeding. You may take Tylenol as directed or talk to your primary doctor about alternatives.  PLEASE ENSURE THAT YOU DO NOT RUN OUT OF YOUR PLAVIX. This medication is very important to remain on for at least 42month. IF you have issues obtaining this medication due to cost please CALL the office 3-5 business days prior to running out in order to prevent missing doses of this medication.   Increase activity slowly   Complete by: As directed        Discharge Medications   Allergies as of 08/12/2021       Reactions   Pork-derived Products Other (See Comments)   Does NOT eat pork        Medication  List     STOP taking these medications    etodolac 400 MG tablet Commonly known as: LODINE   metoprolol tartrate 50 MG tablet Commonly known as: LOPRESSOR       TAKE these medications    amLODipine 10 MG tablet Commonly known as: NORVASC Take 1 tablet (10 mg total) by mouth daily.   anastrozole 1 MG tablet Commonly known as: ARIMIDEX Take 1 tablet (1 mg total) by mouth daily.   aspirin 81 MG chewable tablet Chew 1 tablet (81 mg total) by mouth daily.   atorvastatin 80 MG tablet Commonly known as: LIPITOR TAKE 1 TABLET BY MOUTH EVERY  DAY   baclofen 10 MG tablet Commonly known as: LIORESAL One po qid What changed:  how much to take how to take this when to take this reasons to take this additional instructions   clopidogrel 75 MG tablet Commonly known as: PLAVIX Take 1 tablet (75 mg total) by mouth daily with breakfast.   fluticasone 50 MCG/ACT nasal spray Commonly known as: FLONASE Place 1-2 sprays into both nostrils daily as needed for allergies or rhinitis.   furosemide 20 MG tablet Commonly known as: LASIX TAKE 1 TABLET EVERY DAY FOR FLUID RETENTION What changed: See the new instructions.   gabapentin 300 MG capsule Commonly known as: NEURONTIN TAKE 3 CAPSULES BY MOUTH 3 TIMES DAY   lisinopril 40 MG tablet Commonly known as: ZESTRIL Take 1 tablet (40 mg total) by mouth daily. What changed: when to take this   methocarbamol 500 MG tablet Commonly known as: ROBAXIN Take 1 tablet (500 mg total) by mouth 3 (three) times daily as needed for muscle spasms. TAKE 1 TABLET BY MOUTH THREE TIMES A DAY AS NEEDED What changed:  when to take this additional instructions   nitroGLYCERIN 0.4 MG SL tablet Commonly known as: NITROSTAT PLACE 1 TABLET UNDER THE TONGUE EVERY 5 MINUTES AS NEEDED FOR CHEST PAIN   Oxcarbazepine 300 MG tablet Commonly known as: TRILEPTAL Take one pill qAM, one pill po qPM and two pills po qHS   oxybutynin 10 MG 24 hr  tablet Commonly known as: DITROPAN-XL TAKE 1 TABLET AT BEDTIME   PROBIOTIC-10 PO Take 1 capsule by mouth daily.   spironolactone 50 MG tablet Commonly known as: ALDACTONE Take 1 tablet each morning and 1/2 tablet each evening.   SWIMMERS EAR DROPS OT Place 2 drops into both ears daily as needed (water in ears).   traMADol 50 MG tablet Commonly known as: ULTRAM TAKE 1 TABLET(50 MG) BY MOUTH TWICE DAILY AS NEEDED   venlafaxine XR 75 MG 24 hr capsule Commonly known as: EFFEXOR-XR TAKE 1 CAPSULE(75 MG) BY MOUTH DAILY WITH BREAKFAST        Outstanding Labs/Studies   Outpatient dopplers  Duration of Discharge Encounter   Greater than 30 minutes including physician time.  Signed, Anna Bellis, NP 08/12/2021, 10:29 AM

## 2021-08-12 NOTE — Progress Notes (Signed)
Nursing DC note  Patient alert and oriented, verbalized understanding of dc instructions. Toc meds and belongings given to patient. Volunteer transported patient to the main entrance to meet family.

## 2021-08-14 ENCOUNTER — Other Ambulatory Visit: Payer: Self-pay | Admitting: Internal Medicine

## 2021-08-14 ENCOUNTER — Other Ambulatory Visit: Payer: Self-pay | Admitting: Neurology

## 2021-08-17 ENCOUNTER — Other Ambulatory Visit: Payer: Self-pay | Admitting: Internal Medicine

## 2021-08-17 ENCOUNTER — Ambulatory Visit: Payer: 59 | Admitting: Adult Health

## 2021-08-17 DIAGNOSIS — E785 Hyperlipidemia, unspecified: Secondary | ICD-10-CM

## 2021-08-19 ENCOUNTER — Other Ambulatory Visit: Payer: Self-pay | Admitting: Cardiovascular Disease

## 2021-08-19 ENCOUNTER — Other Ambulatory Visit: Payer: Self-pay

## 2021-08-19 ENCOUNTER — Ambulatory Visit (HOSPITAL_COMMUNITY)
Admission: RE | Admit: 2021-08-19 | Discharge: 2021-08-19 | Disposition: A | Discharge status: Home / Self Care | Payer: 59 | Source: Ambulatory Visit | Attending: Cardiology | Admitting: Cardiology

## 2021-08-19 DIAGNOSIS — I739 Peripheral vascular disease, unspecified: Secondary | ICD-10-CM

## 2021-08-19 DIAGNOSIS — Z95828 Presence of other vascular implants and grafts: Secondary | ICD-10-CM | POA: Diagnosis not present

## 2021-08-20 ENCOUNTER — Encounter (HOSPITAL_COMMUNITY): Payer: Self-pay | Admitting: *Deleted

## 2021-08-20 ENCOUNTER — Emergency Department (HOSPITAL_COMMUNITY): Payer: 59

## 2021-08-20 ENCOUNTER — Other Ambulatory Visit: Payer: Self-pay

## 2021-08-20 ENCOUNTER — Emergency Department (HOSPITAL_COMMUNITY)
Admission: EM | Admit: 2021-08-20 | Discharge: 2021-08-20 | Disposition: A | Discharge status: Home / Self Care | Payer: 59 | Attending: Emergency Medicine | Admitting: Emergency Medicine

## 2021-08-20 DIAGNOSIS — Y92410 Unspecified street and highway as the place of occurrence of the external cause: Secondary | ICD-10-CM | POA: Diagnosis not present

## 2021-08-20 DIAGNOSIS — Y9389 Activity, other specified: Secondary | ICD-10-CM | POA: Insufficient documentation

## 2021-08-20 DIAGNOSIS — M79605 Pain in left leg: Secondary | ICD-10-CM | POA: Diagnosis not present

## 2021-08-20 DIAGNOSIS — M542 Cervicalgia: Secondary | ICD-10-CM | POA: Diagnosis not present

## 2021-08-20 DIAGNOSIS — M79604 Pain in right leg: Secondary | ICD-10-CM | POA: Insufficient documentation

## 2021-08-20 DIAGNOSIS — Z5321 Procedure and treatment not carried out due to patient leaving prior to being seen by health care provider: Secondary | ICD-10-CM | POA: Diagnosis not present

## 2021-08-20 MED ORDER — OXYCODONE-ACETAMINOPHEN 5-325 MG PO TABS: 1.0000 | ORAL_TABLET | Freq: Once | ORAL | Status: DC
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
## 2021-08-20 NOTE — ED Notes (Signed)
Called pt x3 for vitals, no response. 

## 2021-08-20 NOTE — ED Notes (Signed)
Called pt for vitals, registration was notified by pt that pt was leaving.
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
## 2021-08-26 ENCOUNTER — Ambulatory Visit (INDEPENDENT_AMBULATORY_CARE_PROVIDER_SITE_OTHER): Payer: 59 | Admitting: Cardiovascular Disease

## 2021-08-26 ENCOUNTER — Encounter: Payer: Self-pay | Admitting: Cardiovascular Disease

## 2021-08-26 ENCOUNTER — Other Ambulatory Visit: Payer: Self-pay

## 2021-08-26 DIAGNOSIS — I739 Peripheral vascular disease, unspecified: Secondary | ICD-10-CM | POA: Diagnosis not present

## 2021-08-26 DIAGNOSIS — E782 Mixed hyperlipidemia: Secondary | ICD-10-CM | POA: Diagnosis not present

## 2021-08-26 NOTE — Assessment & Plan Note (Signed)
History of PAD with left lower extremity claudication recent angiogram performed 08/11/2021 revealing an 80% distal left common iliac artery stenosis which I stented using a 9 mm x 60 mm long absolute nitinol self-expanding stent.  She did have a 60 mm gradient across this.  In addition, she had a 90% fairly focal mid left SFA stenosis with three-vessel runoff.  There was no obstructive disease on the right.  Her claudication has improved as have the velocities in her iliac system.  I am going to repeat her Dopplers in 6 months and then see her back after that.  If she has no further claudication we will continue to follow her SFA lesion.

## 2021-08-26 NOTE — Progress Notes (Signed)
08/26/2021 Anna Henson   1971-09-23  [id number]  Primary Physician Pcp, No Primary Cardiologist: Lorretta Harp MD Garret Reddish, [place], [place]  HPI:  Anna Henson is a 50 y.o.  mild to moderately overweight single African-American female with no children referred to me by Dr. Debara Pickett for evaluation of potential renal vascular hypertension.  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  I last saw her in the office 07/29/2021.Marland Kitchen  Her cardiac risk factors are notable for discontinue tobacco abuse having smoked approximately 12 pack years and discontinued in 2015.  She has treated hypertension and hyperlipidemia.  Her mother had myocardial infarction and stents.  She has had a heart attack in the past and has had RCA stenting 09/14/2014 and again 12/31/2016.  She has had hypertension for many years and has had several admissions for hypertensive urgency.  She had renal Doppler studies performed in our office 10/15/2020 that suggested mild to moderate left renal artery stenosis with a renal aortic ratio of 3.58 and subsequent CTA performed 11/19/2020 that showed moderate left renal artery stenosis in the 50% range which was confirmatory.  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  Since I saw her a month ago I did end up performing peripheral angiography on her 08/11/2021 revealing a high-grade distal left common iliac artery stenosis which I stented.  She had a 90% focal mid left SFA which I did not intervene on with three-vessel runoff.  There was no obstructive disease on the right.  She did have a 40% proximal left renal artery stenosis.  Her claudication is significantly improved since her procedure.   Current Meds  Medication Sig   amLODipine (NORVASC) 10 MG tablet Take 1 tablet (10 mg total) by mouth daily.   anastrozole (ARIMIDEX) 1 MG tablet Take 1 tablet (1 mg total) by mouth daily.   aspirin 81 MG chewable tablet Chew 1 tablet  (81 mg total) by mouth daily.   atorvastatin (LIPITOR) 80 MG tablet TAKE 1 TABLET BY MOUTH EVERY DAY   baclofen (LIORESAL) 10 MG tablet One po qid (Patient taking differently: Take 10 mg by mouth 4 (four) times daily as needed for muscle spasms.)   clopidogrel (PLAVIX) 75 MG tablet Take 1 tablet (75 mg total) by mouth daily with breakfast.   fluticasone (FLONASE) 50 MCG/ACT nasal spray Place 1-2 sprays into both nostrils daily as needed for allergies or rhinitis.   furosemide (LASIX) 20 MG tablet TAKE 1 TABLET EVERY DAY FOR FLUID RETENTION (Patient taking differently: Take 20 mg by mouth daily as needed for fluid or edema.)   gabapentin (NEURONTIN) 300 MG capsule TAKE 3 CAPSULES BY MOUTH 3 TIMES DAY   Isopropyl Alcohol (SWIMMERS EAR DROPS OT) Place 2 drops into both ears daily as needed (water in ears).   lisinopril (ZESTRIL) 20 MG tablet TAKE 1 TABLET BY MOUTH TWICE DAILY   lisinopril (ZESTRIL) 40 MG tablet Take 1 tablet (40 mg total) by mouth daily. (Patient taking differently: Take 40 mg by mouth at bedtime.)   methocarbamol (ROBAXIN) 500 MG tablet TAKE 1 TABLET BY MOUTH 3 TIMES DAILY AS NEEDED FOR MUSCLE SPASMS.   nitroGLYCERIN (NITROSTAT) 0.4 MG SL tablet PLACE 1 TABLET UNDER THE TONGUE EVERY 5 MINUTES AS NEEDED FOR CHEST PAIN   Oxcarbazepine (TRILEPTAL) 300 MG tablet Take one pill qAM, one pill po qPM and two pills po qHS   oxybutynin (DITROPAN-XL) 10 MG 24  hr tablet TAKE 1 TABLET AT BEDTIME   Probiotic Product (PROBIOTIC-10 PO) Take 1 capsule by mouth daily.   spironolactone (ALDACTONE) 50 MG tablet Take 1 tablet each morning and 1/2 tablet each evening.   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   Drug use: No   Sexual activity: Not Currently  Other Topics Concern   Not on file  Social History Narrative   Consumes 2 cups of caffeine daily   Social Determinants of Health   Financial Resource Strain: Not on file  Food Insecurity: Not on file  Transportation Needs: Not on file  Physical Activity: Not on file  Stress: Not on file  Social Connections: Not on file  Intimate Partner Violence: Not on file     Review of Systems: General: negative for chills, fever, night sweats or weight changes.  Cardiovascular: negative for chest pain, dyspnea on exertion, edema, orthopnea, palpitations, paroxysmal nocturnal dyspnea or shortness of breath Dermatological: negative for rash Respiratory: negative for cough or wheezing Urologic: negative for hematuria Abdominal: negative for nausea, vomiting, diarrhea, bright red blood per rectum, melena, or hematemesis Neurologic: negative for visual changes, syncope, or dizziness All other systems reviewed and are otherwise negative except as noted above.    Blood pressure 96/62, pulse 78, height 5\' 2"  (1.575 m), weight 130 lb 9.6 oz (59.2 kg), last menstrual period 03/06/2020, SpO2 97 %.  General appearance: alert and no distress Neck: no adenopathy, no carotid bruit, no JVD, supple, symmetrical, trachea midline, and thyroid  not enlarged, symmetric, no tenderness/mass/nodules Lungs: clear to auscultation bilaterally Heart: regular rate and rhythm, S1, S2 normal, no murmur, click, rub or gallop Extremities: extremities normal, atraumatic, no cyanosis or edema Pulses: 2+ and symmetric Skin: Skin color, texture, turgor normal. No rashes or lesions Neurologic: Grossly normal  EKG not performed today  ASSESSMENT AND PLAN:   HLD (hyperlipidemia) History of hyperlipidemia on atorvastatin 80 mg a day with lipid liver profile performed 10/09/2020 revealing a total cholesterol of 226, LDL of 161 HDL 29.  I am going to send her back to her primary cardiologist, Dr. Debara Pickett, for consideration of additional and/or alternative lipid-lowering therapy such as PCSK9.  Claudication in peripheral vascular disease ([place]) History of PAD with left lower extremity claudication recent angiogram performed 08/11/2021 revealing an 80% distal left common iliac artery stenosis which I  stented using a 9 mm x 60 mm long absolute nitinol self-expanding stent.  She did have a 60 mm gradient across this.  In addition, she had a 90% fairly focal mid left SFA stenosis with three-vessel runoff.  There was no obstructive disease on the right.  Her claudication has improved as have the velocities in her iliac system.  I am going to repeat her Dopplers in 6 months and then see her back after that.  If she has no further claudication we will continue to follow her SFA lesion.     Lorretta Harp MD FACP,FACC,FAHA, [facility] 08/26/2021 9:40 AM

## 2021-08-26 NOTE — Assessment & Plan Note (Signed)
History of hyperlipidemia on atorvastatin 80 mg a day with lipid liver profile performed 10/09/2020 revealing a total cholesterol of 226, LDL of 161 HDL 29.  I am going to send her back to her primary cardiologist, Dr. Debara Pickett, for consideration of additional and/or alternative lipid-lowering therapy such as PCSK9.

## 2021-08-26 NOTE — Patient Instructions (Signed)
Medication Instructions:  Your physician recommends that you continue on your current medications as directed. Please refer to the Current Medication list given to you today.  *If you need a refill on your cardiac medications before your next appointment, please call your pharmacy*   Testing/Procedures: Dr. Gwenlyn Found has recommended that you have an Ultrasound of your AORTA/IVC/ILIACS.   To prepare for this test:  No food after 11PM the night before. Water is OK. (Don't drink liquids if you have been instructed not to for ANOTHER test).  Avoid foods that produce bowel gas, for 24 hours prior to exam (see below). No breakfast, no chewing gum, no smoking or carbonated beverages. Patient may take morning medications with water. Come in for test at least 15 minutes early to register.  Your physician has requested that you have an ankle brachial index (ABI). During this test an ultrasound and blood pressure cuff are used to evaluate the arteries that supply the arms and legs with blood. Allow thirty minutes for this exam. There are no restrictions or special instructions.  To be done in May 2023. These procedures are done at [facility].   Follow-Up: At [facility], you and your health needs are our priority.  As part of our continuing mission to provide you with exceptional heart care, we have created designated Provider Care Teams.  These Care Teams include your primary Cardiologist (physician) and Advanced Practice Providers (APPs -  Physician Assistants and Nurse Practitioners) who all work together to provide you with the care you need, when you need it.  We recommend signing up for the patient portal called "MyChart".  Sign up information is provided on this After Visit Summary.  MyChart is used to connect with patients for Virtual Visits (Telemedicine).  Patients are able to view lab/test results, encounter notes, upcoming appointments, etc.  Non-urgent messages can be sent to your  provider as well.   To learn more about what you can do with MyChart, go to [email or web address].    Your next appointment:   6 month(s)  The format for your next appointment:   In Person  Provider:   Quay Burow, MD       Then, Pixie Casino, MD will plan to see you again at next available.

## 2021-08-27 ENCOUNTER — Other Ambulatory Visit: Payer: Self-pay | Admitting: Neurology

## 2021-08-29 NOTE — Telephone Encounter (Signed)
Scheduled 10-24-2021 AL/NP.

## 2021-09-09 ENCOUNTER — Telehealth: Payer: Self-pay | Admitting: Cardiovascular Disease

## 2021-09-09 MED ORDER — CLOPIDOGREL BISULFATE 75 MG PO TABS: 75.0000 mg | ORAL_TABLET | Freq: Every day | ORAL | 1 refills | Status: DC

## 2021-09-09 NOTE — Telephone Encounter (Signed)
Called and informed patient that her Plavix 75 mg daily was refilled and sent to the request pharmacy. She thanked me for calling and all (if any) questions were answered.

## 2021-09-09 NOTE — Telephone Encounter (Signed)
*  STAT* If patient is at the pharmacy, call can be transferred to refill team.   1. Which medications need to be refilled? (please list name of each medication and dose if known) clopidogrel (PLAVIX) 75 MG tablet  2. Which pharmacy/location (including street and city if local pharmacy) is medication to be sent to? [id number] - [place], [place] - [address]  3. Do they need a 30 day or 90 day supply? 90 day   Patient is completely out of medication

## 2021-09-13 ENCOUNTER — Other Ambulatory Visit: Payer: Self-pay | Admitting: Cardiovascular Disease

## 2021-09-13 DIAGNOSIS — E785 Hyperlipidemia, unspecified: Secondary | ICD-10-CM

## 2021-09-25 ENCOUNTER — Other Ambulatory Visit: Payer: Self-pay | Admitting: Internal Medicine

## 2021-09-26 ENCOUNTER — Other Ambulatory Visit: Payer: Self-pay | Admitting: Neurology

## 2021-09-30 ENCOUNTER — Other Ambulatory Visit: Payer: Self-pay | Admitting: Neurology

## 2021-10-03 ENCOUNTER — Encounter: Payer: Self-pay | Admitting: Neurology

## 2021-10-04 NOTE — Telephone Encounter (Signed)
Gave completed/signed form back to MR to process for pt.

## 2021-10-04 NOTE — Telephone Encounter (Signed)
Received refill request for Tramadol.  Last OV was on 04/22/21.  Next OV is scheduled for 10/24/21 .  Last RX was written on 08/29/21 for 60 tabs.   [facility] has been reviewed.

## 2021-10-07 ENCOUNTER — Encounter: Payer: Self-pay | Admitting: Internal Medicine

## 2021-10-07 ENCOUNTER — Ambulatory Visit (INDEPENDENT_AMBULATORY_CARE_PROVIDER_SITE_OTHER): Payer: 59 | Admitting: Internal Medicine

## 2021-10-07 ENCOUNTER — Other Ambulatory Visit: Payer: Self-pay

## 2021-10-07 VITALS — BP 115/68 | HR 87 | Ht 61.05 in | Wt 133.2 lb

## 2021-10-07 DIAGNOSIS — E785 Hyperlipidemia, unspecified: Secondary | ICD-10-CM | POA: Diagnosis not present

## 2021-10-07 DIAGNOSIS — I739 Peripheral vascular disease, unspecified: Secondary | ICD-10-CM

## 2021-10-07 DIAGNOSIS — I2510 Atherosclerotic heart disease of native coronary artery without angina pectoris: Secondary | ICD-10-CM

## 2021-10-07 NOTE — Patient Instructions (Signed)
Medication Instructions:  Dr. Debara Pickett may prescribe a PCSK9i based on lab results -- either Repatha Sureclick 140mg /mL or Praluent 150mg /mL  *If you need a refill on your cardiac medications before your next appointment, please call your pharmacy*   Lab Work: FASTING lipid panel -- lab is closed January 2   If you have labs (blood work) drawn today and your tests are completely normal, you will receive your results only by: [place] (if you have MyChart) OR A paper copy in the mail If you have any lab test that is abnormal or we need to change your treatment, we will call you to review the results.  Follow-Up: At [facility], you and your health needs are our priority.  As part of our continuing mission to provide you with exceptional heart care, we have created designated Provider Care Teams.  These Care Teams include your primary Cardiologist (physician) and Advanced Practice Providers (APPs -  Physician Assistants and Nurse Practitioners) who all work together to provide you with the care you need, when you need it.  We recommend signing up for the patient portal called "MyChart".  Sign up information is provided on this After Visit Summary.  MyChart is used to connect with patients for Virtual Visits (Telemedicine).  Patients are able to view lab/test results, encounter notes, upcoming appointments, etc.  Non-urgent messages can be sent to your provider as well.   To learn more about what you can do with MyChart, go to [email or web address].    Your next appointment:   4 month(s)  The format for your next appointment:   In Person  Provider:   Pixie Casino, MD {

## 2021-10-07 NOTE — Progress Notes (Signed)
10/07/2021   PCP: Pcp, No  CC: Follow-up  HPI:  50 year old Female hx of hypertension, dyslipidemia, chronic tobacco use, chronic kidney disease stage III admitted with hypertensive urgency 09/13/14 and 6/10 chest pain EKG showed lateral T-wave inversions on admission and cardiology was consulted. It was felt she had unstable angina and she was placed on nitro drip.  Cardiac catheterization with 2.25X 12 mm Promus remainder DES to the M RCA on 09/14/14 for 90% stenosis.  EF noted 65- 70%.  She did well and discharged without complication.    She was on numerous BP meds and her BP was running in the 90s to 106 and she felt very weak, lethargic.  The hydralazine was stopped and the cardizem.  She is feeling better.  She has occasional brief chest pressure feeling like the discomfort she was admitted with.  She is here today for follow-up. She is actually doing quite well her questions we discussed where the pneumonia vaccine she is only 24 and essentially in good health told her that was not needed unless she wanted it she does not want a flu vaccine she's had side effects in the past and refuses to take it. She is scheduled for sleep study January 29 2 [place] [facility]. She was recently seen by her primary care for cold symptoms and was placed on Zithromax.  She is feeling better.    She is asking about exercise which I recommended she could go back to to start slowly but work only a peripheral 1-2 weeks. She did not have a heart attack and her EF is normal.  Her mother was with her today who also has stents but the mother is or bare-metal stents. We discussed the difference between the 2 and the need for Plavix with both, and the length of time she would need to take the Plavix.  She is no longer smoking. She is using the NicoDerm.  I saw Anna Henson back today in the office for follow-up. She denies any chest pain or worsening shortness of breath. She seems to be stable from a cardiac standpoint  after her stent. She does however have an acute complaint. She tells me that about 2 weeks ago she had a viral upper respiratory infection and had a lot of discharge and pus. She has sore throat associated with this. Subsequently she felt a pop on the right side of her head. After that she had pain and numbness on the right side of her face on her right shoulder and down to the right upper arm and right upper back. She's also had difficulty swallowing due to pain particularly in the right side of her throat as well as some mild drooling. She denies any facial droop, right sided weakness, loss of consciousness, change in vision or other associated symptoms. She denies any headache. Blood pressure has been well controlled and is at goal today at 122/76. She was seen in urgent care and treated for acute pansinusitis by Dr. Elder Cyphers and given amoxicillin which she completed. Lab showed no elevated white count, normal renal function, her cholesterol profile was well controlled, and she had a low ESR 15  (which would likely exclude temporal arteritis).  02/23/2017  Anna Henson returns today for follow-up. Unfortunately she suffered a non-ST elevation MI in March 2018. She was found to have total occlusion of the RCA treated with drug-eluting stents. There was some mild nonobstructive disease of the LAD and circumflex and some mild LV systolic dysfunction with  EF 50-55%. She reports doing much better since discharge. She denies any recurrent chest pain but does get short of breath and some fatigue with exertion. She started cardiac rehabilitation but found that it worsened her chronic back pain and now she's discontinued that. She recently saw Ignacia Bayley, NP, in follow-up. She was felt to be doing well although reported having some tachycardia palpitations when awakening from sleep in the middle the night. He was concerned about arrhythmias and placed a monitor. Her monitor showed normal sinus rhythm without any  arrhythmias. She since discontinued that. She is on Brilinta but does not report that her shortness of breath seems to be related to the doses. I advised that she can consider some caffeine after the medication to see if it attenuates some of the symptoms.  05/16/2017  Anna Henson returns today for follow-up. Recently she's noted that her blood pressures been running much higher although was well controlled in the hospital. She denies any chest pain although has had some mild shortness of breath and occasional sharp chest discomfort. Blood pressure is notably been elevated with systolics in the 627O and diastolics over 350K. Yesterday she was advised to take an additional lisinopril which brought her systolic blood pressure down to the 150s. Typically her blood pressures are elevated more in the evening per her report and in the morning. She was present on a diuretic was taken off of that however due to low blood pressure. Unfortunate, she was recently diagnosed with breast cancer and will need a surgery most likely in September. This will be delayed as she is on dual antiplatelet therapy given her recent stent in March 2018.   09/07/2017  Anna Henson of returns today for follow-up.  She underwent surgery for breast cancer in September.  She was off of her Effient for that procedure and then restarted it.  Since then she has been undergoing radiation therapy.  She seems to be doing fairly well with this.  She denies any recurrent chest pain or worsening shortness of breath.  Lipid profile in September 2018 showed total cholesterol 120, triglycerides 70, HDL 37 and LDL 69.  12/21/2017  Anna Henson returns today for follow-up of her coronary disease.  She underwent PCI in March 2018.  Is now been a year since then.  She is on dual antiplatelet therapy with aspirin and Effient.  She reports ongoing chest wall pain which is worse with breathing.  The pain is described as sharp and occurs on a daily basis.  Is not  necessarily worsened.  This is different pain than she had with her most recent PCI.  There is concern this may be related to chest wall radiation.  09/09/2020  Anna Henson returns today for follow-up.  Recently she was seen by Almyra Deforest, PA-C who had made some adjustments to her medications.  She had surgery and he had switched her to metoprolol, stopping her hydralazine and her amlodipine.  She seems to be tolerating this well however blood pressure is not optimally controlled.  She is notices more recently been around 140/70.  Currently she is on amlodipine 10 mg, lisinopril 20 mg twice daily and metoprolol.  She has not been on any diuretic.  11/11/2020  Anna Henson returns today for follow-up.  Fortunately she was just hospitalized.  She was noted to have chest pain and elevated blood pressure over 938 systolic.  Her medications were adjusted and she was started on low-dose Aldactone.  An echo was performed which showed  normal systolic function and grade 2 diastolic dysfunction.  Subsequently she reports her chest pain is improved and her blood pressure is now more reasonably controlled today at 140/76.  Renal artery Dopplers were performed which demonstrated no evidence of right renal artery stenosis but greater than 60% left renal artery stenosis.  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  10/07/2021  Anna Henson is seen today in follow-up.  She has been following with Dr. Gwenlyn Found has been working with her regarding PAD.  She has had significant improvements in that.  Unfortunately her cholesterol remains high.  Her last assessment was in January this year which showed a total cholesterol of 226, LDL 161, HDL 29 and triglycerides 181.  Her target LDL is less than 70.  She reports compliance with atorvastatin 80 mg daily.  This is suggestive of a familial hyperlipidemia.  It would explain her more aggressive coronary disease and PAD given her young age.  She will likely need additional therapy,  probably a PCSK9 inhibitor.  Current Outpatient Medications  Medication Sig Dispense Refill   amLODipine (NORVASC) 10 MG tablet Take 1 tablet (10 mg total) by mouth daily. 90 tablet 0   anastrozole (ARIMIDEX) 1 MG tablet Take 1 tablet (1 mg total) by mouth daily. 90 tablet 3   aspirin 81 MG chewable tablet Chew 1 tablet (81 mg total) by mouth daily.     atorvastatin (LIPITOR) 80 MG tablet TAKE 1 TABLET BY MOUTH EVERY DAY 90 tablet 1   baclofen (LIORESAL) 10 MG tablet One po qid (Patient taking differently: Take 10 mg by mouth 4 (four) times daily as needed for muscle spasms.) 120 tablet 11   clopidogrel (PLAVIX) 75 MG tablet Take 1 tablet (75 mg total) by mouth daily with breakfast. 90 tablet 1   fluticasone (FLONASE) 50 MCG/ACT nasal spray Place 1-2 sprays into both nostrils daily as needed for allergies or rhinitis.     furosemide (LASIX) 20 MG tablet TAKE 1 TABLET EVERY DAY FOR FLUID RETENTION 30 tablet 2   gabapentin (NEURONTIN) 300 MG capsule TAKE 3 CAPSULES BY MOUTH 3 TIMES DAY 270 capsule 2   Isopropyl Alcohol (SWIMMERS EAR DROPS OT) Place 2 drops into both ears daily as needed (water in ears).     methocarbamol (ROBAXIN) 500 MG tablet TAKE 1 TABLET BY MOUTH 3 TIMES DAILY AS NEEDED FOR MUSCLE SPASMS. 90 tablet 1   nitroGLYCERIN (NITROSTAT) 0.4 MG SL tablet PLACE 1 TABLET UNDER THE TONGUE EVERY 5 MINUTES AS NEEDED FOR CHEST PAIN 25 tablet 4   Oxcarbazepine (TRILEPTAL) 300 MG tablet Take one pill qAM, one pill po qPM and two pills po qHS 120 tablet 11   oxybutynin (DITROPAN-XL) 10 MG 24 hr tablet TAKE 1 TABLET AT BEDTIME 30 tablet 5   Probiotic Product (PROBIOTIC-10 PO) Take 1 capsule by mouth daily.     spironolactone (ALDACTONE) 50 MG tablet Take 1 tablet each morning and 1/2 tablet each evening. 135 tablet 1   traMADol (ULTRAM) 50 MG tablet TAKE 1 TABLET(50 MG) BY MOUTH TWICE DAILY AS NEEDED 60 tablet 0   venlafaxine XR (EFFEXOR-XR) 75 MG 24 hr capsule TAKE 1 CAPSULE(75 MG) BY MOUTH  DAILY WITH BREAKFAST 90 capsule 3   lisinopril (ZESTRIL) 20 MG tablet TAKE 1 TABLET BY MOUTH TWICE DAILY (Patient not taking: Reported on 10/07/2021) 60 tablet 4   lisinopril (ZESTRIL) 40 MG tablet Take 1 tablet (40 mg total) by mouth daily. (Patient not taking: Reported on 10/07/2021) 90  tablet 3   No current facility-administered medications for this visit.    Past Medical History:  Diagnosis Date   Anemia    Anxiety    Bipolar disorder in full remission ([place])    CAD in native artery cardiologist--  dr Jveon Pound   a. 09/2014 Cath/PCI in setting of [place] - s/p 2.25 x 12 mm Promus Premier DES to mid RCA;  b. 11/2016 Myoview: EF 56%, no ischemia;  c. 12/2016 NSTEMI/PCI: LM nl, LAd 25p, LCX 30p, RCA 100p (2.75x38 Promus Premier DES), mRCA 10 ISR, EF 50-55%.   CKD (chronic kidney disease), stage III ([place])    Depression    Genetic testing 04/23/2017   Anna Henson underwent genetic counseling and testing for hereditary cancer syndromes on 04/05/2017. Her results were negative for mutations in all 46 genes analyzed by [facility]'s 46-gene Common Hereditary Cancers Panel. Genes analyzed include: APC, ATM, AXIN2, BARD1, BMPR1A, BRCA1, BRCA2, BRIP1, CDH1, CDKN2A, CHEK2, CTNNA1, DICER1, EPCAM, GREM1, HOXB13, KIT, MEN1, MLH1, MSH2, MSH3, MSH6, MUTYH, NBN,   GERD (gastroesophageal reflux disease)    Headache    History of external beam radiation therapy    right breast 07-27-2017  to 09-25-2017   History of non-ST elevation myocardial infarction (NSTEMI)    HOCM (hypertrophic obstructive cardiomyopathy) ([place]) followed by cardiology   a. 12/2016 Echo: EF 65-70%,  mod conc LVH, dynamic obstruction @ rest, peak velocity of 291 cm/sec w/ peak gradient of 24mHg, no rwma, Gr1 DD, triv TR, PASP 193mg.   Hyperlipidemia    Hypertension    Malignant neoplasm of lower-outer quadrant of right breast of female, estrogen receptor positive [facility] dr guLindi Adie dx 06/ 2018--- right breast invasive lobular carcinoma,  ductal carcinoma w/ LCIS---- 06-21-2017 s/p right breast lumpecotmy w/ sln disseciton;   completed radiation 09-25-2017;  on tamoxifen   [facility]   S/P drug eluting coronary stent placement    09-14-2014---  PCI with DES x1 to midRCA;  12-31-2016  PCI with DES x1 to proxRCA   [facility] dr saFelecia Shelling 01/ 2017  dx transverse myelitis w/ right side numbness (09-01-2019  currently lower extremity weakness, mucsle spasms, and gait disturbance)   Wears glasses     Past Surgical History:  Procedure Laterality Date   ABDOMINAL AORTOGRAM W/LOWER EXTREMITY N/A 08/11/2021   Procedure: ABDOMINAL AORTOGRAM W/LOWER EXTREMITY;  Surgeon: BeLorretta HarpMD;  [facility];  Service: Cardiovascular;  Laterality: N/A;   BREAST LUMPECTOMY WITH RADIOACTIVE SEED AND SENTINEL LYMPH NODE BIOPSY Right 06/21/2017   Procedure: RIGHT BREAST BRACKETED SEED GUIDED LUMPECTOMY AND SENTINEL LYMPH NODE BIOPSY;  Surgeon: ToJovita KussmaulMD;  [place] [facility] Service: General;  Laterality: Right;   CORONARY/GRAFT ACUTE MI REVASCULARIZATION N/A 12/31/2016   Procedure: Coronary/Graft Acute MI Revascularization;  Surgeon: MiSherren MochaMD;  [facility];  Service: Cardiovascular;  Laterality: N/A;   DILATATION & CURETTAGE/HYSTEROSCOPY WITH MYOSURE N/A 09/02/2019   Procedure: DILATATION & CURETTAGE/HYSTEROSCOPY WITH MYOSURE;  Surgeon: FoAloha GellMD;  [place] [facility] Service: Gynecology;  Laterality: N/A;   HERNIA REPAIR     LEFT HEART CATH AND CORONARY ANGIOGRAPHY N/A 12/31/2016   Procedure: Left Heart Cath and Coronary Angiography;  Surgeon: MiSherren MochaMD;  [place];  Service: Cardiovascular;  Laterality: N/A;   LEFT HEART CATHETERIZATION WITH CORONARY ANGIOGRAM N/A 09/14/2014   Procedure: LEFT HEART CATHETERIZATION WITH  CORONARY ANGIOGRAM;  Surgeon: Burnell Blanks, MD;  Location: [facility]  CATH LAB;  Service: Cardiovascular;  Laterality: N/A;   MASS EXCISION N/A 03/30/2020   Procedure: EXCISION MASS RIGHT LABIA MINORA;  Surgeon: Lafonda Mosses, MD;  Location: [facility];  Service: Gynecology;  Laterality: N/A;   PERCUTANEOUS CORONARY STENT INTERVENTION (PCI-S)  09/14/2014   Procedure: PERCUTANEOUS CORONARY STENT INTERVENTION (PCI-S);  Surgeon: Burnell Blanks, MD;  [facility] C[facility] LAB;  Service: Cardiovascular;;   PERIPHERAL VASCULAR INTERVENTION  08/11/2021   Procedure: PERIPHERAL VASCULAR INTERVENTION;  Surgeon: Lorretta Harp, MD;  Location: [place] CV LAB;  Service: Cardiovascular;;   ROBOTIC ASSISTED SALPINGO OOPHERECTOMY Bilateral 03/30/2020   Procedure: XI ROBOTIC ASSISTED SALPINGO OOPHORECTOMY;  Surgeon: Lafonda Mosses, MD;  Location: [facility];  Service: Gynecology;  Laterality: Bilateral;   TONSILLECTOMY  4383   UMBILICAL HERNIA REPAIR  2001; 2005    ROS: Pertinent items noted in HPI and remainder of comprehensive ROS otherwise negative.  Wt Readings from Last 3 Encounters:  10/07/21 133 lb 3.2 oz (60.4 kg)  08/26/21 130 lb 9.6 oz (59.2 kg)  08/20/21 135 lb (61.2 kg)    PHYSICAL EXAM BP 115/68    Pulse 87    Ht 5' 1.05" (1.551 m)    Wt 133 lb 3.2 oz (60.4 kg)    LMP 03/06/2020    SpO2 99%    BMI 25.13 kg/m  General appearance: alert and no distress Neck: no carotid bruit and no JVD Lungs: clear to auscultation bilaterally Heart: regular rate and rhythm, S1, S2 normal, no murmur, click, rub or gallop Abdomen: soft, non-tender; bowel sounds normal; no masses,  no organomegaly Extremities: extremities normal, atraumatic, no cyanosis or edema Pulses: 2+ and symmetric Skin: Skin color, texture, turgor normal. 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  Will likely need a PCSK9 inhibitor.  We will check a repeat lipid profile and consider additional therapy based on that to target LDL cholesterol below 70.  If she remains well above target, will reach out for prior authorization and plan repeat lipids again in about 3 to 4 months after starting additional therapy on top of her statin.  Pixie Casino, MD, [facility], [place] Director of the Advanced Lipid Disorders &  Cardiovascular Risk Reduction Clinic Attending Cardiologist  Direct Dial: [phone]   Fax: [phone]  Website:  [email or web address]

## 2021-10-17 ENCOUNTER — Other Ambulatory Visit: Payer: Self-pay | Admitting: Neurology

## 2021-10-24 ENCOUNTER — Ambulatory Visit: Payer: 59 | Admitting: Family Medicine

## 2021-10-24 ENCOUNTER — Encounter: Payer: Self-pay | Admitting: Family Medicine

## 2021-10-24 VITALS — BP 126/72 | HR 86 | Ht 61.5 in | Wt 136.0 lb

## 2021-10-24 DIAGNOSIS — R269 Unspecified abnormalities of gait and mobility: Secondary | ICD-10-CM | POA: Diagnosis not present

## 2021-10-24 DIAGNOSIS — R208 Other disturbances of skin sensation: Secondary | ICD-10-CM | POA: Diagnosis not present

## 2021-10-24 DIAGNOSIS — G373 Acute transverse myelitis in demyelinating disease of central nervous system: Secondary | ICD-10-CM | POA: Diagnosis not present

## 2021-10-24 DIAGNOSIS — M4802 Spinal stenosis, cervical region: Secondary | ICD-10-CM | POA: Diagnosis not present

## 2021-10-24 DIAGNOSIS — R29818 Other symptoms and signs involving the nervous system: Secondary | ICD-10-CM

## 2021-10-24 MED ORDER — PREDNISONE 10 MG (21) PO TBPK: ORAL_TABLET | ORAL | 0 refills | Status: DC

## 2021-10-24 NOTE — Progress Notes (Signed)
Chief Complaint  Patient presents with   Follow-up    Rm 2, alone. Here to f/u for transverse myelitis. Pt reports being in pn since stopping etodolac. Pt is now on Plavix and ASA.      HISTORY OF PRESENT ILLNESS:  10/24/21 ALL:  Anna Henson is a 51 y.o. female here today for follow up for transverse myelitis.   She reports having more pain since having to discontinue etodolac. She is on Plavix and asa following vascular stenting of left leg. She has significant neck pain that radiates to her bilateral shoulders, down spine and around rib cage on both sides. She reports spasms of muscles, all over. She has a tingling sensation in the back of her neck. Gait is stable. She does not use assistive device. No recent falls.   She is taking baclofen four times daily (least effective), methocarbamol three times daily that does help, gabapentin 94m three times daily (makes her very sleepy), oxcarbazepine 300/300/6098mand tramadol 5060mwice daily.   She sleeps well. Oxybutynin helps with bladder control. 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 She is able to get up and move around when she needs to. Mood is good but she does gets sad when she reminisces about how active she was as a teenager and young adult.    HISTORY (copied from Dr SatGarth Bignessevious note)  Anna Henson a [age] woman with a transverse myelitis in late January 2017.     Update7/02/2021 Anna Henson transverse myelitis in 2017.  Although she had some improvement of strength, sensation and mobility, she continues to have difficulty with her gait.   Mobility needs: She is having difficulty with activities of daily living including going from room to room for self-care activities of daily living including going to the bathroom, meal preparation and simple chores.   Current mobility: She has reduced gait due to weakness, spasticity and ataxia.  Due to the extent of weakness,  ataxia and spasticity, and the weakness and pain in the arms she cannot use a cane or walker safely to meet her mobility needs.  She would be at increased risk of falling with these.  Due to the weakness and pain in the arms, she cannot adequately self propel.  Therefore, she needs a power wheelchair.  A scooter would be difficult for her to operate in her home due to the turning radius and would be more difficult for her to get in and out of.   Current symptoms: She continues to have poor ability to ambulate due to leg weakness, spasticity and ataxia.  She also has some weakness and pain in the arms, right greater than left.   She is experiencing pain in the neck into the right > left shoulder and down her back into the ribs.   Sensation is electric like.  She also has pain in her legs that radiates from her thighs down.    Legs feells weighted down at times, especially if walking longer distance.   Medications (oxcarbarbazepine 300 mg -150 mg- 450 mg qHS, gabapentin, tramadol and etodolac) do help some but pain still flares up at times, especially if walking longer distance.    Baclofen 2-4 times a dayhelps the spasticity some.     MRI 12/2019 showed resolution of the cervical spine lesion seen previously (we discussed that despite no lesion now on MRI a scar could still be left behind).  She had a breast biopsy that showed calcification but no cancer.     Transverse myelitis history: Transverse myelitis history:    In late January 2017,  she had a right sided headache and went to bed.  When she woke up, she noted numbness in the right neck, shoulder and arm.   Numbness worsened over the next fe days.   She went to her PCP an was told she might have sinusitis and Amoxicillin was prescribed.   After a couple more days, she saw another doctor and had a neck soft tissue CT scan.   That CT scan showed that she had an increase of adenoid tissue and reactive type lymph nodes.  By that time, numbness extended  to the fingertips on the riht but not into her leg.   She was told to go to the emergency room for further evaluation. In the emergency room she had an MRI of the cervical spine and the brain. The MRI of the cervical spine showed a focus that extended from the lower medulla on the right down to C5 posteriorly and more medially within the spinal cord. She also had some enhancement around the level of the cervical medullary junction. She was admitted to the hospital for further treatment and testing.    She was treated with 5 days of IV Solu-Medrol. During that time, her symptoms of numbness improved.  She has not returned to baseline.    She had many tests performed including lumbar puncture for CSF analysis and a thoracic spine MRI and serology blood tests. For the most part, the evaluation was negative.  The 02/02/16 cervical spine shows resolution of the prior enhancing focus that was seen at the cervicomedullary junction.     Data:    MRI  has a longitudinal lesion extending from the lower medulla on the right down to C5/C6 on MRI January 2017.  The enhancement does not extend up to the medulla.  Cervical spine also showed mild spinal stenosis at C3-C4 and mild to moderate spinal stenosis at C5-C6 and mild spinal stenosis at C6-C7. There is some foraminal narrowing at those levels but no definite nerve root compression.  Elsewhere the brain, there were some subcortical and deep white matter T2/FLAIR hyperintense foci but not in a pattern that would be typical for MS. The thoracic spine was normal.    I also reviewed her many laboratory tests performed while she was at [employer]. Neuromyelitis optica antibodies are negative. She did not have oligoclonal bands. ANCA, ANA, SSA/SSB, ACE and Vit B-12 were all normal or negative.  She had Epstein-Barr IgG but not IgM. CSF cell counts did not show increase in white blood cells.   The 02/02/16 cervical spine MRI shows resolution of the prior enhancing focus that was  seen at the cervicomedullary/upper cervical spine  .     December 24, 2019 MRI of the cervical spine is normal (MRI February 2017 has shown T2 hyperintensity from the medulla to the mid cervical spine) she does.  She does have degenerative changes.  There is moderate spinal stenosis at C5-C6 with moderate right foraminal narrowing.  At C6-C7, she has left-sided disc osteophyte complex that could affect the left C7 nerve root.   December 24, 2019 MRI of the thoracic spine showed a normal spinal cord and no significant degenerative changes.   REVIEW OF SYSTEMS: Out of a complete 14 system review of symptoms, the patient complains only of the following symptoms, chronic neck pain,  dysesthesias, urinary frequency, and all other reviewed systems are negative.   ALLERGIES: Allergies  Allergen Reactions   Pork-Derived Products Other (See Comments)    Does NOT eat pork     HOME MEDICATIONS: Outpatient Medications Prior to Visit  Medication Sig Dispense Refill   amLODipine (NORVASC) 10 MG tablet Take 1 tablet (10 mg total) by mouth daily. 90 tablet 0   anastrozole (ARIMIDEX) 1 MG tablet Take 1 tablet (1 mg total) by mouth daily. 90 tablet 3   aspirin 81 MG chewable tablet Chew 1 tablet (81 mg total) by mouth daily.     atorvastatin (LIPITOR) 80 MG tablet TAKE 1 TABLET BY MOUTH EVERY DAY 90 tablet 1   baclofen (LIORESAL) 10 MG tablet One po qid (Patient taking differently: Take 10 mg by mouth 4 (four) times daily as needed for muscle spasms.) 120 tablet 11   clopidogrel (PLAVIX) 75 MG tablet Take 1 tablet (75 mg total) by mouth daily with breakfast. 90 tablet 1   fluticasone (FLONASE) 50 MCG/ACT nasal spray Place 1-2 sprays into both nostrils daily as needed for allergies or rhinitis.     furosemide (LASIX) 20 MG tablet TAKE 1 TABLET EVERY DAY FOR FLUID RETENTION 30 tablet 2   gabapentin (NEURONTIN) 300 MG capsule TAKE 3 CAPSULES BY MOUTH 3 TIMES DAY 270 capsule 2   Isopropyl Alcohol (SWIMMERS EAR  DROPS OT) Place 2 drops into both ears daily as needed (water in ears).     lisinopril (ZESTRIL) 20 MG tablet TAKE 1 TABLET BY MOUTH TWICE DAILY (Patient not taking: Reported on 10/07/2021) 60 tablet 4   lisinopril (ZESTRIL) 40 MG tablet Take 1 tablet (40 mg total) by mouth daily. (Patient not taking: Reported on 10/07/2021) 90 tablet 3   methocarbamol (ROBAXIN) 500 MG tablet TAKE 1 TABLET BY MOUTH 3 TIMES DAILY AS NEEDED FOR MUSCLE SPASMS. 90 tablet 1   nitroGLYCERIN (NITROSTAT) 0.4 MG SL tablet PLACE 1 TABLET UNDER THE TONGUE EVERY 5 MINUTES AS NEEDED FOR CHEST PAIN 25 tablet 4   Oxcarbazepine (TRILEPTAL) 300 MG tablet Take one pill qAM, one pill po qPM and two pills po qHS 120 tablet 11   oxybutynin (DITROPAN-XL) 10 MG 24 hr tablet TAKE 1 TABLET BY MOUTH EVERYDAY AT BEDTIME 30 tablet 0   Probiotic Product (PROBIOTIC-10 PO) Take 1 capsule by mouth daily.     spironolactone (ALDACTONE) 50 MG tablet Take 1 tablet each morning and 1/2 tablet each evening. 135 tablet 1   traMADol (ULTRAM) 50 MG tablet TAKE 1 TABLET(50 MG) BY MOUTH TWICE DAILY AS NEEDED 60 tablet 0   venlafaxine XR (EFFEXOR-XR) 75 MG 24 hr capsule TAKE 1 CAPSULE(75 MG) BY MOUTH DAILY WITH BREAKFAST 90 capsule 3   No facility-administered medications prior to visit.     PAST MEDICAL HISTORY: Past Medical History:  Diagnosis Date   Anemia    Anxiety    Bipolar disorder in full remission ([place])    CAD in native artery cardiologist--  dr hilty   a. 09/2014 Cath/PCI in setting of [place] - s/p 2.25 x 12 mm Promus Premier DES to mid RCA;  b. 11/2016 Myoview: EF 56%, no ischemia;  c. 12/2016 NSTEMI/PCI: LM nl, LAd 25p, LCX 30p, RCA 100p (2.75x38 Promus Premier DES), mRCA 10 ISR, EF 50-55%.   CKD (chronic kidney disease), stage III ([place])    Depression    Genetic testing 04/23/2017   Ms. Canlas underwent genetic counseling and testing for hereditary cancer syndromes on 04/05/2017.  Her results were negative for mutations in all 46 genes  analyzed by [facility]'s 46-gene Common Hereditary Cancers Panel. Genes analyzed include: APC, ATM, AXIN2, BARD1, BMPR1A, BRCA1, BRCA2, BRIP1, CDH1, CDKN2A, CHEK2, CTNNA1, DICER1, EPCAM, GREM1, HOXB13, KIT, MEN1, MLH1, MSH2, MSH3, MSH6, MUTYH, NBN,   GERD (gastroesophageal reflux disease)    Headache    History of external beam radiation therapy    right breast 07-27-2017  to 09-25-2017   History of non-ST elevation myocardial infarction (NSTEMI)    HOCM (hypertrophic obstructive cardiomyopathy) ([place]) followed by cardiology   a. 12/2016 Echo: EF 65-70%,  mod conc LVH, dynamic obstruction @ rest, peak velocity of 291 cm/sec w/ peak gradient of 46mmHg, no rwma, Gr1 DD, triv TR, PASP 3mmHg.   Hyperlipidemia    Hypertension    Malignant neoplasm of lower-outer quadrant of right breast of female, estrogen receptor positive [facility]) oncologist--- dr Lindi Adie   dx 06/ 2018--- right breast invasive lobular carcinoma, ductal carcinoma w/ LCIS---- 06-21-2017 s/p right breast lumpecotmy w/ sln disseciton;   completed radiation 09-25-2017;  on tamoxifen   Myocardial infarction [facility])    2017   S/P drug eluting coronary stent placement    09-14-2014---  PCI with DES x1 to midRCA;  12-31-2016  PCI with DES x1 to proxRCA   Transverse myelitis [facility]) neurologist--- dr Felecia Shelling   01/ 2017  dx transverse myelitis w/ right side numbness (09-01-2019  currently lower extremity weakness, mucsle spasms, and gait disturbance)   Wears glasses      PAST SURGICAL HISTORY: Past Surgical History:  Procedure Laterality Date   ABDOMINAL AORTOGRAM W/LOWER EXTREMITY N/A 08/11/2021   Procedure: ABDOMINAL AORTOGRAM W/LOWER EXTREMITY;  Surgeon: Lorretta Harp, MD;  Location: [place] [facility];  Service: Cardiovascular;  Laterality: N/A;   BREAST LUMPECTOMY WITH RADIOACTIVE SEED AND SENTINEL LYMPH NODE BIOPSY Right 06/21/2017   Procedure: RIGHT BREAST BRACKETED SEED GUIDED LUMPECTOMY AND SENTINEL LYMPH NODE BIOPSY;  Surgeon: Jovita Kussmaul, MD;  Location: [place];  Service: General;  Laterality: Right;   CORONARY/GRAFT ACUTE MI REVASCULARIZATION N/A 12/31/2016   Procedure: Coronary/Graft Acute MI Revascularization;  Surgeon: Sherren Mocha, MD;  Location: [place] [facility];  Service: Cardiovascular;  Laterality: N/A;   DILATATION & CURETTAGE/HYSTEROSCOPY WITH MYOSURE N/A 09/02/2019   Procedure: DILATATION & CURETTAGE/HYSTEROSCOPY WITH MYOSURE;  Surgeon: Aloha Gell, MD;  Location: [place];  Service: Gynecology;  Laterality: N/A;   HERNIA REPAIR     LEFT HEART CATH AND CORONARY ANGIOGRAPHY N/A 12/31/2016   Procedure: Left Heart Cath and Coronary Angiography;  Surgeon: Sherren Mocha, MD;  Location: [place] [facility];  Service: Cardiovascular;  Laterality: N/A;   LEFT HEART CATHETERIZATION WITH CORONARY ANGIOGRAM N/A 09/14/2014   Procedure: LEFT HEART CATHETERIZATION WITH CORONARY ANGIOGRAM;  Surgeon: Burnell Blanks, MD;  Location: [facility] CATH LAB;  Service: Cardiovascular;  Laterality: N/A;   MASS EXCISION N/A 03/30/2020   Procedure: EXCISION MASS RIGHT LABIA MINORA;  Surgeon: Lafonda Mosses, MD;  Location: [facility];  Service: Gynecology;  Laterality: N/A;   PERCUTANEOUS CORONARY STENT INTERVENTION (PCI-S)  09/14/2014   Procedure: PERCUTANEOUS CORONARY STENT INTERVENTION (PCI-S);  Surgeon: Burnell Blanks, MD;  Location: [facility] CATH LAB;  Service: Cardiovascular;;   PERIPHERAL VASCULAR INTERVENTION  08/11/2021   Procedure: PERIPHERAL VASCULAR INTERVENTION;  Surgeon: Lorretta Harp, MD;  Location: [place] [facility];  Service: Cardiovascular;;   ROBOTIC ASSISTED SALPINGO OOPHERECTOMY Bilateral 03/30/2020   Procedure: XI ROBOTIC ASSISTED SALPINGO OOPHORECTOMY;  Surgeon:  Lafonda Mosses, MD;  Location: [facility];  Service: Gynecology;  Laterality: Bilateral;   TONSILLECTOMY  0347   UMBILICAL HERNIA REPAIR  2001; 2005     FAMILY HISTORY: Family History  Problem Relation Age of Onset   Diabetes  Mother    Heart disease Mother    Hyperlipidemia Mother    Hypertension Mother    Breast cancer Mother    Lung cancer Father    Drug abuse Father    Brain cancer Father 24   Bone cancer Father 59   Drug abuse Sister    Mental illness Brother    Mental illness Maternal Grandmother    Stomach cancer Paternal Grandmother 74       d.75     SOCIAL HISTORY: Social History   Socioeconomic History   Marital status: Single    Spouse name: Not on file   Number of children: 0   Years of education: masters   Highest education level: Not on file  Occupational History   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   Drug use: No   Sexual activity: Not Currently  Other Topics Concern   Not on file  Social History Narrative   Consumes 2 cups of caffeine daily   Social Determinants of Health   Financial Resource Strain: Not on file  Food Insecurity: Not on file  Transportation Needs: Not on file  Physical Activity: Not on file  Stress: Not on file  Social Connections: Not on file  Intimate Partner Violence: Not on file     PHYSICAL EXAM  Vitals:   10/24/21 1451  BP: 126/72  Pulse: 86  SpO2: 97%  Weight: 136 lb (61.7 kg)  Height: 5' 1.5" (1.562 m)   Body mass index is 25.28 kg/m.  Generalized: Well developed, in no acute distress  Cardiology: normal rate and rhythm, no murmur auscultated  Respiratory: clear to auscultation bilaterally    Neurological examination  Mentation: Alert oriented to time, place, history taking. Follows all commands speech and  language fluent Cranial nerve II-XII: Pupils were equal round reactive to light. Extraocular movements were full, visual field were full on confrontational test. Facial sensation and strength were normal. Head turning and shoulder shrug  were normal and symmetric. Motor: The motor testing reveals 5 over 5 strength of all 4 extremities. Very slight weakness of left lower with hip flexion  Sensory: Sensory testing is intact to soft touch on all 4 extremities. No evidence of extinction is noted. Positive Lhermitte sign Coordination: Cerebellar testing reveals good finger-nose-finger and heel-to-shin bilaterally.  Gait and station: Pushes to standing position. Gait is mildly spastic but stable without assistive device. Unable to tandem Reflexes: Deep tendon reflexes are symmetric and normal bilaterally.    DIAGNOSTIC DATA (LABS, IMAGING, TESTING) - I reviewed patient records, labs, notes, testing and imaging myself where available.  Lab Results  Component Value Date   WBC 8.3 08/12/2021   HGB 11.7 (  L) 08/12/2021   HCT 33.7 (L) 08/12/2021   MCV 90.3 08/12/2021   PLT 341 08/12/2021      Component Value Date/Time   NA 134 (L) 08/12/2021 0407   NA 135 08/05/2021 1605   NA 139 04/04/2017 0835   K 4.9 08/12/2021 0407   K 3.6 04/04/2017 0835   CL 105 08/12/2021 0407   CO2 19 (L) 08/12/2021 0407   CO2 25 04/04/2017 0835   GLUCOSE 80 08/12/2021 0407   GLUCOSE 83 04/04/2017 0835   BUN 12 08/12/2021 0407   BUN 10 08/05/2021 1605   BUN 13.9 04/04/2017 0835   CREATININE 0.99 08/12/2021 0407   CREATININE 1.19 (H) 04/01/2018 1015   CREATININE 1.3 (H) 04/04/2017 0835   CALCIUM 9.5 08/12/2021 0407   CALCIUM 10.0 04/04/2017 0835   PROT 7.2 04/12/2021 1352   PROT 6.7 04/04/2017 0835   ALBUMIN 4.7 04/12/2021 1352   ALBUMIN 3.9 04/04/2017 0835   AST 19 04/12/2021 1352   AST 18 04/01/2018 1015   AST 23 04/04/2017 0835   ALT 20 04/12/2021 1352   ALT 11 04/01/2018 1015   ALT 22 04/04/2017 0835    ALKPHOS 113 04/12/2021 1352   ALKPHOS 69 04/04/2017 0835   BILITOT 0.2 04/12/2021 1352   BILITOT <0.2 (L) 04/01/2018 1015   BILITOT <0.22 04/04/2017 0835   GFRNONAA >60 08/12/2021 0407   GFRNONAA 54 (L) 04/01/2018 1015   GFRNONAA 56 (L) 11/24/2015 1113   GFRAA >60 03/23/2020 1541   GFRAA >60 04/01/2018 1015   GFRAA 65 11/24/2015 1113   Lab Results  Component Value Date   CHOL 226 (H) 10/09/2020   HDL 29 (L) 10/09/2020   LDLCALC 161 (H) 10/09/2020   TRIG 181 (H) 10/09/2020   CHOLHDL 7.8 10/09/2020   Lab Results  Component Value Date   HGBA1C 5.8 02/22/2016   Lab Results  Component Value Date   VITAMINB12 541 11/12/2015   Lab Results  Component Value Date   TSH 0.573 10/15/2020    No flowsheet data found.   No flowsheet data found.   ASSESSMENT AND PLAN  51 y.o. year old female  has a past medical history of Anemia, Anxiety, Bipolar disorder in full remission ([place]), CAD in native artery (cardiologist--  dr Debara Pickett), CKD (chronic kidney disease), stage III ([place]), Depression, Genetic testing (04/23/2017), GERD (gastroesophageal reflux disease), Headache, History of external beam radiation therapy, History of non-ST elevation myocardial infarction (NSTEMI), HOCM (hypertrophic obstructive cardiomyopathy) ([place]) (followed by cardiology), Hyperlipidemia, Hypertension, Malignant neoplasm of lower-outer quadrant of right breast of female, estrogen receptor positive ([place]) (oncologist--- dr Lindi Adie), Myocardial infarction [facility]), S/P drug eluting coronary stent placement, Transverse myelitis [facility]) (neurologist--- dr Felecia Shelling), and Wears glasses. here with    Transverse myelitis ([place]) - Plan: Ambulatory referral to Pain Clinic, Ambulatory referral to Physical Therapy  Dysesthesia - Plan: Ambulatory referral to Pain Clinic  Cervical spinal stenosis - Plan: Ambulatory referral to Pain Clinic, Ambulatory referral to Physical Therapy  Gait disturbance - Plan: Ambulatory referral to  Physical Therapy  Lhermitte's sign positive  Joyanne has experienced more pain of neck, shoulders and trunk following discontinuation of etodolac. She is unable to take Nsaid agents due to being on Plavix and asa following vascular stenting. She will continue baclofen, methocarbamol, gabapentin, oxcarbazepine and tramadol as prescribed. PDMP shows appropriate refills. I will have her take steroid taper for acute pain relief. I will refer her to PT and pain management for ongoing pain management.  She will continue  oxybutynin for bladder control. Healthy lifestyle habits with regular physical activity encouraged. She will follow up with neurology in 6 months, sooner if needed. She verbalizes understanding and agreement with this plan.    Orders Placed This Encounter  Procedures   Ambulatory referral to Pain Clinic    Referral Priority:   Routine    Referral Type:   Consultation    Referral Reason:   Specialty Services Required    Requested Specialty:   Pain Medicine    Number of Visits Requested:   1   Ambulatory referral to Physical Therapy    Referral Priority:   Routine    Referral Type:   Physical Medicine    Referral Reason:   Specialty Services Required    Requested Specialty:   Physical Therapy    Number of Visits Requested:   1     Meds ordered this encounter  Medications   predniSONE (STERAPRED UNI-PAK 21 TAB) 10 MG (21) TBPK tablet    Sig: Taper pack as directed    Dispense:  1 each    Refill:  0    Order Specific Question:   Supervising Provider    Answer:   Melvenia Beam [[id number]]      Debbora Presto, MSN, FNP-C 10/24/2021, 4:07 PM  [facility] [address], [place], [place] [address] [phone]

## 2021-10-24 NOTE — Patient Instructions (Addendum)
Below is our plan:  We will continue baclofen four times daily, methocarbamol three times daily, gabapentin 900mg  three times daily, oxcarbazepine 300/300/600mg  and tramadol 50mg  twice daily. I am going to ref you to physical therapy as well as a pain management provider to look at other options to help control pain levels. Injections or other invasive procedures could offer some benefit. I have called a steroid taper pack to [facility]. Take as directed. Call with any concerns.   Please make sure you are staying well hydrated. I recommend 50-60 ounces daily. Well balanced diet and regular exercise encouraged. Consistent sleep schedule with 6-8 hours recommended.   Please continue follow up with care team as directed.   Follow up with Dr Felecia Shelling in 6 months  You may receive a survey regarding today's visit. I encourage you to leave honest feed back as I do use this information to improve patient care. Thank you for seeing me today!

## 2021-10-26 ENCOUNTER — Telehealth: Payer: Self-pay | Admitting: Family Medicine

## 2021-10-26 NOTE — Telephone Encounter (Signed)
Referral sent to [facility]. Phone [phone].

## 2021-11-07 ENCOUNTER — Ambulatory Visit: Payer: 59 | Admitting: Physical Therapy

## 2021-11-07 NOTE — Progress Notes (Incomplete)
Patient Care Team: Pcp, No as PCP - General Hilty, Nadean Corwin, MD as PCP - Cardiology (Cardiology) Sater, Nanine Means, MD as Consulting Physician (Neurology) Jovita Kussmaul, MD as Consulting Physician (General Surgery) Nicholas Lose, MD as Consulting Physician (Hematology and Oncology) Kyung Rudd, MD as Consulting Physician (Radiation Oncology)  DIAGNOSIS: No diagnosis found.  SUMMARY OF ONCOLOGIC HISTORY: Oncology History  Malignant neoplasm of lower-outer quadrant of right breast of female, estrogen receptor positive ([place])  03/26/2017 Initial Diagnosis   Palpable right breast mass with a normal mammogram: 8:00 position by ultrasound 3.2 cm irregular mass: Grade 2 invasive lobular cancer ER 85%, PR 100%, Ki-67 2%, HER-2 negative ratio 1.5, T2 N0 stage II a clinical stage   04/20/2017 Genetic Testing   Genetic counseling and testing for hereditary cancer syndromes performed on . Results are negative for pathogenic mutations in 46 genes analyzed by [facility] Common Hereditary Cancers Panel. Results are dated . Genes tested: APC, ATM, AXIN2, BARD1, BMPR1A, BRCA1, BRCA2, BRIP1, CDH1, CDKN2A, CHEK2, CTNNA1, DICER1, EPCAM, GREM1, HOXB13, KIT, MEN1, MLH1, MSH2, MSH3, MSH6, MUTYH, NBN, NF1, NTHL1, PALB2, PDGFRA, PMS2, POLD1, POLE, PTEN, RAD50, RAD51C, RAD51D, SDHA, SDHB, SDHC, SDHD, SMAD4, SMARCA4, STK11, TP53, TSC1, TSC2, and VHL.  Variants of uncertain significance (not clinically actionable) were noted in APC and BRIP1.      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 with above-mentioned history of breast cancer treated with lumpectomy, radiation, and who is currently on anastrozole. She presents to the clinic today for follow-up.   ALLERGIES:  is allergic to pork-derived products.  MEDICATIONS:  Current Outpatient Medications  Medication Sig Dispense Refill   amLODipine (NORVASC) 10 MG tablet Take 1 tablet (10 mg total) by mouth daily. 90 tablet 0   anastrozole (ARIMIDEX) 1 MG tablet Take 1 tablet (1 mg total) by mouth daily. 90 tablet 3   aspirin 81 MG chewable tablet Chew 1 tablet (81 mg total) by mouth daily.     atorvastatin (LIPITOR) 80 MG tablet TAKE 1 TABLET BY MOUTH EVERY DAY 90 tablet 1   baclofen (LIORESAL) 10 MG tablet One po qid (Patient taking differently: Take 10 mg by mouth 4 (four) times daily as needed for muscle spasms.) 120 tablet 11   clopidogrel (PLAVIX) 75 MG tablet Take 1 tablet (75 mg total) by mouth daily with breakfast. 90 tablet 1   fluticasone (FLONASE) 50 MCG/ACT nasal spray Place 1-2 sprays into both nostrils daily as needed for allergies or rhinitis.     furosemide (LASIX) 20 MG tablet TAKE 1 TABLET EVERY DAY FOR FLUID RETENTION 30 tablet 2   gabapentin (NEURONTIN) 300 MG capsule TAKE 3 CAPSULES BY MOUTH 3 TIMES DAY 270 capsule 2   Isopropyl Alcohol (SWIMMERS EAR DROPS OT) Place 2 drops into both ears daily as needed (water in ears).     lisinopril (ZESTRIL) 20 MG tablet TAKE 1 TABLET BY MOUTH TWICE DAILY (  Patient not taking: Reported on 10/07/2021) 60 tablet 4   lisinopril (ZESTRIL) 40 MG tablet Take 1 tablet (40 mg total) by mouth daily. (Patient not taking: Reported on 10/07/2021) 90 tablet 3   methocarbamol (ROBAXIN) 500 MG tablet TAKE 1 TABLET BY MOUTH 3 TIMES DAILY AS NEEDED FOR MUSCLE SPASMS. 90 tablet 1   nitroGLYCERIN (NITROSTAT) 0.4 MG SL tablet PLACE 1 TABLET UNDER THE TONGUE EVERY 5 MINUTES AS NEEDED FOR CHEST PAIN 25 tablet 4    Oxcarbazepine (TRILEPTAL) 300 MG tablet Take one pill qAM, one pill po qPM and two pills po qHS 120 tablet 11   oxybutynin (DITROPAN-XL) 10 MG 24 hr tablet TAKE 1 TABLET BY MOUTH EVERYDAY AT BEDTIME 30 tablet 0   predniSONE (STERAPRED UNI-PAK 21 TAB) 10 MG (21) TBPK tablet Taper pack as directed 1 each 0   Probiotic Product (PROBIOTIC-10 PO) Take 1 capsule by mouth daily.     spironolactone (ALDACTONE) 50 MG tablet Take 1 tablet each morning and 1/2 tablet each evening. 135 tablet 1   traMADol (ULTRAM) 50 MG tablet TAKE 1 TABLET(50 MG) BY MOUTH TWICE DAILY AS NEEDED 60 tablet 0   venlafaxine XR (EFFEXOR-XR) 75 MG 24 hr capsule TAKE 1 CAPSULE(75 MG) BY MOUTH DAILY WITH BREAKFAST 90 capsule 3   No current facility-administered medications for this visit.    PHYSICAL EXAMINATION: ECOG PERFORMANCE STATUS: {CHL ONC ECOG PS:[phone]}  There were no vitals filed for this visit. There were no vitals filed for this visit.  BREAST:*** No palpable masses or nodules in either right or left breasts. No palpable axillary supraclavicular or infraclavicular adenopathy no breast tenderness or nipple discharge. (exam performed in the presence of a chaperone)  LABORATORY DATA:  I have reviewed the data as listed CMP Latest Ref Rng & Units 08/12/2021 08/05/2021 04/12/2021  Glucose 70 - 99 mg/dL 80 69(L) 93  BUN 6 - 20 mg/dL '12 10 17  ' Creatinine 0.44 - 1.00 mg/dL 0.99 0.90 1.36(H)  Sodium 135 - 145 mmol/L 134(L) 135 137  Potassium 3.5 - 5.1 mmol/L 4.9 4.8 4.4  Chloride 98 - 111 mmol/L 105 98 101  CO2 22 - 32 mmol/L 19(L) 22 21  Calcium 8.9 - 10.3 mg/dL 9.5 10.1 10.4(H)  Total Protein 6.0 - 8.5 g/dL - - 7.2  Total Bilirubin 0.0 - 1.2 mg/dL - - 0.2  Alkaline Phos 44 - 121 IU/L - - 113  AST 0 - 40 IU/L - - 19  ALT 0 - 32 IU/L - - 20    Lab Results  Component Value Date   WBC 8.3 08/12/2021   HGB 11.7 (L) 08/12/2021   HCT 33.7 (L) 08/12/2021   MCV 90.3 08/12/2021   PLT 341 08/12/2021   NEUTROABS  4.9 10/15/2020    ASSESSMENT & PLAN:  No problem-specific Assessment & Plan notes found for this encounter.    No orders of the defined types were placed in this encounter.  The patient has a good understanding of the overall plan. she agrees with it. she will call with any problems that may develop before the next visit here.  Total time spent: *** mins including face to face time and time spent for planning, charting and coordination of care  Rulon Eisenmenger, MD, MPH 11/07/2021  I, Thana Ates, am acting as scribe for Dr. Nicholas Lose.  {insert scribe attestation}

## 2021-11-08 ENCOUNTER — Other Ambulatory Visit: Payer: Self-pay | Admitting: Neurology

## 2021-11-08 ENCOUNTER — Inpatient Hospital Stay: Payer: 59 | Admitting: Hematology and Oncology

## 2021-11-08 NOTE — Assessment & Plan Note (Deleted)
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  Bilateral breast tenderness:ultrasound in both breasts.05/06/2019: Benign Breast cancer surveillance: 1.Breast exam 11/08/2021: Benign 2.Mammogram: 01/06/2021 at [facility]: [facility]: Benign, breast density category B   Return to clinicin 1 year for follow-up

## 2021-11-15 ENCOUNTER — Other Ambulatory Visit: Payer: Self-pay | Admitting: Neurology

## 2021-11-28 NOTE — Progress Notes (Signed)
Patient Care Team: Pcp, No as PCP - General Hilty, Nadean Corwin, MD as PCP - Cardiology (Cardiology) Sater, Nanine Means, MD as Consulting Physician (Neurology) Jovita Kussmaul, MD as Consulting Physician (General Surgery) Nicholas Lose, MD as Consulting Physician (Hematology and Oncology) Kyung Rudd, MD as Consulting Physician (Radiation Oncology)  DIAGNOSIS:    ICD-10-CM   1. Malignant neoplasm of lower-outer quadrant of right breast of female, estrogen receptor positive ([place])  C50.511    Z17.0       SUMMARY OF ONCOLOGIC HISTORY: Oncology History  Malignant neoplasm of lower-outer quadrant of right breast of female, estrogen receptor positive ([place])  03/26/2017 Initial Diagnosis   Palpable right breast mass with a normal mammogram: 8:00 position by ultrasound 3.2 cm irregular mass: Grade 2 invasive lobular cancer ER 85%, PR 100%, Ki-67 2%, HER-2 negative ratio 1.5, T2 N0 stage II a clinical stage   04/20/2017 Genetic Testing   Genetic counseling and testing for hereditary cancer syndromes performed on . Results are negative for pathogenic mutations in 46 genes analyzed by [facility] Common Hereditary Cancers Panel. Results are dated . Genes tested: APC, ATM, AXIN2, BARD1, BMPR1A, BRCA1, BRCA2, BRIP1, CDH1, CDKN2A, CHEK2, CTNNA1, DICER1, EPCAM, GREM1, HOXB13, KIT, MEN1, MLH1, MSH2, MSH3, MSH6, MUTYH, NBN, NF1, NTHL1, PALB2, PDGFRA, PMS2, POLD1, POLE, PTEN, RAD50, RAD51C, RAD51D, SDHA, SDHB, SDHC, SDHD, SMAD4, SMARCA4, STK11, TP53, TSC1, TSC2, and VHL.  Variants of uncertain significance (not clinically actionable) were noted in APC and BRIP1.      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 with above-mentioned history of right breast cancer treated with lumpectomy, radiation, and who is currently on anastrozole. Mammogram on 01/06/2021 showed no evidence of malignancy. She presents to the clinic today for follow-up.  She is tolerating anastrozole extremely well without any problems or concerns.  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  ALLERGIES:  is allergic to pork-derived products.  MEDICATIONS:  Current Outpatient Medications  Medication Sig Dispense Refill   amLODipine (NORVASC) 10 MG tablet Take 1 tablet (10 mg total) by mouth daily. 90 tablet 0   anastrozole (ARIMIDEX) 1 MG tablet Take 1 tablet (1 mg total) by mouth daily. 90 tablet 3   aspirin 81 MG chewable tablet Chew 1 tablet (81 mg total) by mouth daily.     atorvastatin (LIPITOR) 80 MG tablet TAKE 1 TABLET BY MOUTH EVERY DAY 90 tablet 1   baclofen (LIORESAL) 10 MG tablet One po qid (Patient taking differently: Take 10 mg by mouth 4 (four) times daily as needed for muscle spasms.) 120 tablet 11   clopidogrel (PLAVIX) 75 MG tablet Take 1 tablet (75 mg total) by mouth daily with breakfast. 90 tablet 1   fluticasone (FLONASE) 50 MCG/ACT nasal spray Place 1-2 sprays into both nostrils daily as needed for allergies or rhinitis.     furosemide (LASIX)  20 MG tablet TAKE 1 TABLET EVERY DAY FOR FLUID RETENTION 30 tablet 2   gabapentin (NEURONTIN) 300 MG capsule TAKE 3 CAPSULES BY MOUTH 3 TIMES DAY 270 capsule 2   Isopropyl Alcohol (SWIMMERS EAR DROPS OT) Place 2 drops into both ears daily as needed (water in ears).     lisinopril (ZESTRIL) 40 MG tablet Take 1 tablet (40 mg total) by mouth daily. (Patient not taking: Reported on 10/07/2021) 90 tablet 3    methocarbamol (ROBAXIN) 500 MG tablet TAKE 1 TABLET BY MOUTH THREE TIMES A DAY AS NEEDED FOR MUSCLE SPASMS 90 tablet 1   nitroGLYCERIN (NITROSTAT) 0.4 MG SL tablet PLACE 1 TABLET UNDER THE TONGUE EVERY 5 MINUTES AS NEEDED FOR CHEST PAIN 25 tablet 4   Oxcarbazepine (TRILEPTAL) 300 MG tablet Take one pill qAM, one pill po qPM and two pills po qHS 120 tablet 11   Probiotic Product (PROBIOTIC-10 PO) Take 1 capsule by mouth daily.     spironolactone (ALDACTONE) 50 MG tablet Take 1 tablet each morning and 1/2 tablet each evening. 135 tablet 1   traMADol (ULTRAM) 50 MG tablet TAKE 1 TABLET(50 MG) BY MOUTH TWICE DAILY AS NEEDED 60 tablet 0   venlafaxine XR (EFFEXOR-XR) 75 MG 24 hr capsule TAKE 1 CAPSULE(75 MG) BY MOUTH DAILY WITH BREAKFAST 90 capsule 3   No current facility-administered medications for this visit.    PHYSICAL EXAMINATION: ECOG PERFORMANCE STATUS: 1 - Symptomatic but completely ambulatory  Vitals:   11/29/21 1547  BP: (!) 107/53  Pulse: 87  Resp: 18  Temp: (!) 97.2 F (36.2 C)  SpO2: 100%   Filed Weights   11/29/21 1547  Weight: 136 lb 14.4 oz (62.1 kg)    BREAST: No palpable masses or nodules in either right or left breasts. No palpable axillary supraclavicular or infraclavicular adenopathy no breast tenderness or nipple discharge. (exam performed in the presence of a chaperone)  LABORATORY DATA:  I have reviewed the data as listed CMP Latest Ref Rng & Units 08/12/2021 08/05/2021 04/12/2021  Glucose 70 - 99 mg/dL 80 69(L) 93  BUN 6 - 20 mg/dL '12 10 17  ' Creatinine 0.44 - 1.00 mg/dL 0.99 0.90 1.36(H)  Sodium 135 - 145 mmol/L 134(L) 135 137  Potassium 3.5 - 5.1 mmol/L 4.9 4.8 4.4  Chloride 98 - 111 mmol/L 105 98 101  CO2 22 - 32 mmol/L 19(L) 22 21  Calcium 8.9 - 10.3 mg/dL 9.5 10.1 10.4(H)  Total Protein 6.0 - 8.5 g/dL - - 7.2  Total Bilirubin 0.0 - 1.2 mg/dL - - 0.2  Alkaline Phos 44 - 121 IU/L - - 113  AST 0 - 40 IU/L - - 19  ALT 0 - 32 IU/L - - 20    Lab Results   Component Value Date   WBC 8.3 08/12/2021   HGB 11.7 (L) 08/12/2021   HCT 33.7 (L) 08/12/2021   MCV 90.3 08/12/2021   PLT 341 08/12/2021   NEUTROABS 4.9 10/15/2020    ASSESSMENT & PLAN:  Malignant neoplasm of lower-outer quadrant of right breast of female, estrogen receptor positive ([place]) 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   Bilateral breast tenderness: ultrasound in both breasts.  05/06/2019: Benign Breast cancer surveillance: 1.  Breast exam   11/29/2021: Benign 2.  Mammogram: 01/06/2021: [facility]: Benign, breast density category B CT thoracic spine 08/20/2021: Negative for any thoracic spine fracture or lesions   Return to clinic  in 1 year for follow-up    No orders of the defined types were placed in this encounter.  The patient has a good understanding of the overall plan. she agrees with it. she will call with any problems that may develop before the next visit here.  Total time spent: 20 mins including face to face time and time spent for planning, charting and coordination of care  Rulon Eisenmenger, MD, MPH 11/29/2021  I, Thana Ates, am acting as scribe for Dr. Nicholas Lose.  I have reviewed the above documentation for accuracy and completeness, and I agree with the above.

## 2021-11-29 ENCOUNTER — Other Ambulatory Visit: Payer: Self-pay

## 2021-11-29 ENCOUNTER — Inpatient Hospital Stay: Payer: 59 | Attending: Hematology and Oncology | Admitting: Hematology and Oncology

## 2021-11-29 DIAGNOSIS — C50511 Malignant neoplasm of lower-outer quadrant of right female breast: Secondary | ICD-10-CM | POA: Diagnosis present

## 2021-11-29 DIAGNOSIS — Z170 Estrogen receptor positive status [ER+]: Secondary | ICD-10-CM | POA: Insufficient documentation

## 2021-11-29 MED ORDER — ANASTROZOLE 1 MG PO TABS: 1.0000 mg | ORAL_TABLET | Freq: Every day | ORAL | 3 refills | Status: DC

## 2021-11-29 NOTE — Assessment & Plan Note (Signed)
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  Bilateral breast tenderness:ultrasound in both breasts.05/06/2019: Benign Breast cancer surveillance: 1.Breast exam  11/29/2021: Benign 2.Mammogram: 01/06/2021: [facility]: Benign, breast density category B CT thoracic spine 08/20/2021: Negative for any thoracic spine fracture or lesions  Return to clinicin 1 year for follow-up

## 2021-11-30 ENCOUNTER — Telehealth: Payer: Self-pay | Admitting: Hematology and Oncology

## 2021-11-30 NOTE — Telephone Encounter (Signed)
Scheduled appointment per 2/21 los. Patient is aware. 

## 2021-12-10 ENCOUNTER — Other Ambulatory Visit: Payer: Self-pay | Admitting: Neurology

## 2021-12-29 ENCOUNTER — Telehealth: Payer: Self-pay | Admitting: Family Medicine

## 2021-12-29 ENCOUNTER — Encounter (HOSPITAL_COMMUNITY): Payer: Self-pay | Admitting: Emergency Medicine

## 2021-12-29 ENCOUNTER — Ambulatory Visit (INDEPENDENT_AMBULATORY_CARE_PROVIDER_SITE_OTHER): Payer: 59

## 2021-12-29 ENCOUNTER — Ambulatory Visit (HOSPITAL_COMMUNITY)
Admission: EM | Admit: 2021-12-29 | Discharge: 2021-12-29 | Disposition: A | Discharge status: Home / Self Care | Payer: 59 | Attending: Internal Medicine | Admitting: Internal Medicine

## 2021-12-29 DIAGNOSIS — M25561 Pain in right knee: Secondary | ICD-10-CM | POA: Diagnosis not present

## 2021-12-29 DIAGNOSIS — W19XXXA Unspecified fall, initial encounter: Secondary | ICD-10-CM | POA: Diagnosis not present

## 2021-12-29 DIAGNOSIS — S8001XA Contusion of right knee, initial encounter: Secondary | ICD-10-CM | POA: Diagnosis not present

## 2021-12-29 MED ORDER — KETOROLAC TROMETHAMINE 30 MG/ML IJ SOLN
INTRAMUSCULAR | Status: AC
  Filled 2021-12-29: qty 1

## 2021-12-29 MED ORDER — KETOROLAC TROMETHAMINE 30 MG/ML IJ SOLN
30.0000 mg | Freq: Once | INTRAMUSCULAR | Status: AC
  Administered 2021-12-29: 30 mg via INTRAMUSCULAR

## 2021-12-29 MED ORDER — PREDNISONE 20 MG PO TABS: 40.0000 mg | ORAL_TABLET | Freq: Every day | ORAL | 0 refills | Status: DC

## 2021-12-29 NOTE — ED Triage Notes (Signed)
Pt reports that yesterday was getting out of car and thought legs were steady but went out on her causing her to fall on to the concrete. Reports went down on right knee. Pt having right knee pains and swelling.  ?

## 2021-12-29 NOTE — Telephone Encounter (Signed)
Patient was seen by Earlie Lou, PA with [facility]. Considering EMG and PT following ESI. Patient requested tramadol to be filled by Dr Felecia Shelling at this time. Will continue to follow with pain management as directed.  ?

## 2021-12-29 NOTE — ED Provider Notes (Signed)
[facility]    CSN: [id number] Arrival date & time: 12/29/21  1911      History   Chief Complaint Chief Complaint  Patient presents with   Fall   Knee Pain    HPI Anna Henson is a 51 y.o. female.   Patient presents with pain and swelling to the right knee for 1 day occurring after a fall.  Endorses she was getting out of her car when she slipped knee directly onto the right knee.  Pain is described as a burning sensation.  Painful to bear weight.  Range of motion intact but painful, endorses she is able to complete but very slowly.  Has attempted use of ice and a heating pad which has not been helpful.    Past Medical History:  Diagnosis Date   Anemia    Anxiety    Bipolar disorder in full remission (HCC)    CAD in native artery cardiologist--  dr hilty   a. 09/2014 Cath/PCI in setting of [place] - s/p 2.25 x 12 mm Promus Premier DES to mid RCA;  b. 11/2016 Myoview: EF 56%, no ischemia;  c. 12/2016 NSTEMI/PCI: LM nl, LAd 25p, LCX 30p, RCA 100p (2.75x38 Promus Premier DES), mRCA 10 ISR, EF 50-55%.   CKD (chronic kidney disease), stage III (HCC)    Depression    Genetic testing 04/23/2017   [name] underwent genetic counseling and testing for hereditary cancer syndromes on 04/05/2017. Her results were negative for mutations in all 46 genes analyzed by [facility]'s 46-gene Common Hereditary Cancers Panel. Genes analyzed include: APC, ATM, AXIN2, BARD1, BMPR1A, BRCA1, BRCA2, BRIP1, CDH1, CDKN2A, CHEK2, CTNNA1, DICER1, EPCAM, GREM1, HOXB13, KIT, MEN1, MLH1, MSH2, MSH3, MSH6, MUTYH, NBN,   GERD (gastroesophageal reflux disease)    Headache    History of external beam radiation therapy    right breast 07-27-2017  to 09-25-2017   History of non-ST elevation myocardial infarction (NSTEMI)    HOCM (hypertrophic obstructive cardiomyopathy) (HCC) followed by cardiology   a. 12/2016 Echo: EF 65-70%,  mod conc LVH, dynamic obstruction @ rest, peak velocity of 291 cm/sec w/ peak gradient  of , no rwma, Gr1 DD, triv TR, PASP .   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--- dr Epimenio Foot   01/ 2017  dx transverse myelitis w/ right side numbness (09-01-2019  currently lower extremity weakness, mucsle spasms, and gait disturbance)   Wears glasses     Patient Active Problem List   Diagnosis Date Noted   Claudication in peripheral vascular disease (HCC) 08/11/2021   Peripheral arterial disease (HCC) 06/17/2021   Hyponatremia 10/14/2020   Chronic diastolic CHF (congestive heart failure) (HCC) 10/08/2020   Neuropathic pain 07/06/2020   Cervical spinal stenosis 06/15/2020   Lhermitte's sign positive 06/15/2020   Labial cyst    Complex cyst of right ovary 12/05/2019   Hypertensive urgency 07/28/2018   Chronic anemia    MDD (major depressive disorder), recurrent episode, severe (HCC) 08/28/2017  Neck stiffness 08/22/2017   Abnormal finding on MRI of brain 08/22/2017   HOCM (hypertrophic obstructive cardiomyopathy) (HCC)    Chest pain 05/06/2017   Genetic testing 04/23/2017   Malignant neoplasm of lower-outer quadrant of right breast of female, estrogen receptor positive (HCC) 03/29/2017   Dysesthesia 03/12/2017   Palpitations 02/23/2017   S/P cardiac cath (12/2016) a. acute total occlusion of the RCA tx w/PCI DES, b. mild nonobs LAD/LCx stenosis, c. 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   Acute-on-chronic kidney injury (HCC) 11/12/2015   Neck pain 11/12/2015   Hypokalemia 11/12/2015   Abnormal MRI, neck 11/12/2015   Numbness 11/12/2015   Hypertensive crisis    Hyperlipidemia    Coronary artery disease involving native coronary artery of native heart without angina pectoris    Facial numbness    Right arm numbness    Throat pain 11/09/2015   Drooling 11/09/2015   CAD (coronary artery disease), native coronary artery 09/15/2014   Headache 09/15/2014   UTI (urinary tract infection): Probable 09/14/2014   Candida infection of genital region 09/14/2014   Unstable angina (HCC) 09/13/2014   HLD (hyperlipidemia) 09/13/2014   Otitis 09/13/2014   ACS (acute coronary syndrome) (HCC) 09/13/2014   Tobacco abuse 09/13/2014   Essential hypertension 09/09/2014    Past Surgical History:  Procedure Laterality Date   ABDOMINAL AORTOGRAM W/LOWER EXTREMITY N/A 08/11/2021   Procedure: ABDOMINAL AORTOGRAM W/LOWER EXTREMITY;  Surgeon: Runell Gess, MD;  [facility];  Service: Cardiovascular;  Laterality: N/A;   BREAST LUMPECTOMY WITH RADIOACTIVE SEED AND SENTINEL LYMPH NODE BIOPSY Right 06/21/2017   Procedure: RIGHT BREAST BRACKETED SEED GUIDED LUMPECTOMY AND SENTINEL LYMPH NODE BIOPSY;  Surgeon: Griselda Miner, MD;  Location: [facility];  Service: General;  Laterality: Right;   CORONARY/GRAFT ACUTE MI REVASCULARIZATION N/A 12/31/2016   Procedure: Coronary/Graft Acute MI Revascularization;  Surgeon: Tonny Bollman, MD;  Location: [facility] INVASIVE CV LAB;  Service: Cardiovascular;  Laterality: N/A;   DILATATION & CURETTAGE/HYSTEROSCOPY WITH MYOSURE N/A 09/02/2019   Procedure: DILATATION &  CURETTAGE/HYSTEROSCOPY WITH MYOSURE;  Surgeon: Noland Fordyce, MD;  Location: [facility];  Service: Gynecology;  Laterality: N/A;   HERNIA REPAIR     LEFT HEART CATH AND CORONARY ANGIOGRAPHY N/A 12/31/2016   Procedure: Left Heart Cath and Coronary Angiography;  Surgeon: Tonny Bollman, MD;  Location: [facility] INVASIVE CV LAB;  Service: Cardiovascular;  Laterality: N/A;   LEFT HEART CATHETERIZATION WITH CORONARY ANGIOGRAM N/A 09/14/2014   Procedure: LEFT HEART CATHETERIZATION WITH CORONARY ANGIOGRAM;  Surgeon: Kathleene Hazel, MD;  Location[facility] CATH LAB;  Service: Cardiovascular;  Laterality: N/A;   MASS EXCISION N/A 03/30/2020   Procedure: EXCISION MASS RIGHT LABIA MINORA;  Surgeon: Carver Fila, MD;  Location: [facility];  Service: Gynecology;  Laterality: N/A;   PERCUTANEOUS CORONARY STENT INTERVENTION (PCI-S)  09/14/2014   Procedure: PERCUTANEOUS CORONARY STENT INTERVENTION (PCI-S);  Surgeon: Kathleene Hazel, MD;  Location: [facility] CATH LAB;  Service: Cardiovascular;;   PERIPHERAL VASCULAR INTERVENTION  08/11/2021   Procedure: PERIPHERAL VASCULAR INTERVENTION;  Surgeon: Runell Gess, MD;  [facility];  Service: Cardiovascular;;   ROBOTIC ASSISTED SALPINGO OOPHERECTOMY Bilateral 03/30/2020   Procedure: XI ROBOTIC ASSISTED SALPINGO OOPHORECTOMY;  Surgeon: Carver Fila, MD;  Location: [facility];  Service: Gynecology;  Laterality: Bilateral;   TONSILLECTOMY  2005   UMBILICAL HERNIA REPAIR  2001; 2005    OB History   No obstetric history on file.      Home Medications    Prior to Admission medications   Medication Sig Start Date End Date Taking? Authorizing Provider  amLODipine (NORVASC) 10 MG tablet Take 1 tablet (10 mg total) by mouth daily. 07/21/21   Hilty, Lisette Abu, MD  anastrozole (ARIMIDEX) 1 MG tablet Take 1 tablet (1 mg total) by mouth daily. 11/29/21   Serena Croissant, MD  aspirin 81 MG chewable tablet Chew 1 tablet (81 mg total) by mouth  daily. 09/16/14   Rhetta Mura, MD  atorvastatin (LIPITOR) 80 MG tablet TAKE 1 TABLET BY MOUTH EVERY DAY 09/13/21   Runell Gess, MD  baclofen (LIORESAL) 10 MG tablet One po qid Patient taking differently: Take 10 mg by mouth 4 (four) times daily as needed for muscle spasms. 04/12/21   Sater, Pearletha Furl, MD  clopidogrel (PLAVIX) 75 MG tablet Take 1 tablet (75 mg total) by mouth daily with breakfast. 09/09/21   Runell Gess, MD  fluticasone [facility]) 50 MCG/ACT nasal spray Place 1-2 sprays into both nostrils daily as needed for allergies or rhinitis.    [provider]  furosemide (LASIX) 20 MG tablet TAKE 1 TABLET EVERY DAY FOR FLUID RETENTION 09/26/21   Hilty, Lisette Abu, MD  gabapentin (NEURONTIN) 300 MG capsule TAKE 3 CAPSULES BY MOUTH 3 TIMES DAY 06/23/21   Sater, Pearletha Furl, MD  Isopropyl Alcohol (SWIMMERS EAR DROPS OT) Place 2 drops into both ears daily as needed (water in ears).    [provider]  lisinopril (ZESTRIL) 40 MG tablet Take 1 tablet (40 mg total) by mouth daily. Patient not taking: Reported on 10/07/2021 01/14/21 08/26/21  Runell Gess, MD  methocarbamol (ROBAXIN) 500 MG tablet Take 1 tablet (500 mg total) by mouth 3 (three) times daily as needed for muscle spasms. 12/14/21   Sater, Pearletha Furl, MD  nitroGLYCERIN (NITROSTAT) 0.4 MG SL tablet PLACE 1 TABLET UNDER THE TONGUE EVERY 5 MINUTES AS NEEDED FOR CHEST PAIN 07/15/21   Hilty, Lisette Abu, MD  Oxcarbazepine (TRILEPTAL) 300 MG tablet Take one pill qAM, one pill po qPM and two pills po qHS 04/12/21   Sater, Pearletha Furl, MD  Probiotic Product (PROBIOTIC-10 PO) Take 1 capsule by mouth daily.    [provider]  spironolactone (ALDACTONE) 50 MG tablet Take 1 tablet each morning and 1/2 tablet each evening. 01/18/21   Runell Gess, MD  traMADol (ULTRAM) 50 MG tablet TAKE 1 TABLET(50 MG) BY MOUTH TWICE DAILY AS NEEDED 10/04/21   Sater, Pearletha Furl, MD  venlafaxine XR (EFFEXOR-XR) 75 MG 24 hr capsule  TAKE 1 CAPSULE(75 MG) BY MOUTH DAILY WITH BREAKFAST 11/23/20   Serena Croissant, MD    Family History Family History  Problem Relation Age of Onset   Diabetes Mother    Heart disease Mother    Hyperlipidemia Mother    Hypertension Mother    Breast cancer Mother    Lung cancer Father    Drug abuse Father    Brain cancer Father 75   Bone cancer Father 31   Drug abuse Sister    Mental illness Brother    Mental illness Maternal  Grandmother    Stomach cancer Paternal Grandmother 61       d.75    Social History Social History   Tobacco Use   Smoking status: Former    Packs/day: 0.50    Years: 30.00    Pack years: 15.00    Types: Cigarettes    Quit date: 10/05/2015    Years since quitting: 6.2   Smokeless tobacco: Never  Vaping Use   Vaping Use: Former  Substance Use Topics   Alcohol use: Not Currently    Alcohol/week: 2.0 standard drinks    Types: 2 Cans of beer per week    Comment: occas.   Drug use: No     Allergies   Pork-derived products   Review of Systems Review of Systems Defer to HPI    Physical Exam Triage Vital Signs ED Triage Vitals  Enc Vitals Group     BP 12/29/21 1941 (!) 148/71     Pulse Rate 12/29/21 1941 77     Resp 12/29/21 1941 18     Temp 12/29/21 1941 98.5 F (36.9 C)     Temp Source 12/29/21 1941 Oral     SpO2 12/29/21 1941 96 %     Weight --      Height --      Head Circumference --      Peak Flow --      Pain Score 12/29/21 1940 8     Pain Loc --      Pain Edu? --      Excl. in GC? --    No data found.  Updated Vital Signs BP (!) 148/71 (BP Location: Left Arm)   Pulse 77   Temp 98.5 F (36.9 C) (Oral)   Resp 18   LMP 03/06/2020   SpO2 96%   Visual Acuity Right Eye Distance:   Left Eye Distance:   Bilateral Distance:    Right Eye Near:   Left Eye Near:    Bilateral Near:     Physical Exam Constitutional:      Appearance: Normal appearance.  HENT:     Head: Normocephalic.  Eyes:     Extraocular Movements:  Extraocular movements intact.  Pulmonary:     Effort: Pulmonary effort is normal.  Musculoskeletal:     Comments: Tenderness to the anterior aspect of the right knee, no point tenderness noted, mild swelling noted along the joint space without effusion, range of motion intact, 2+ popliteal pulse, able to bear weight  Neurological:     Mental Status: She is alert and oriented to person, place, and time.  Psychiatric:        Mood and Affect: Mood normal.        Behavior: Behavior normal.     UC Treatments / Results  Labs (all labs ordered are listed, but only abnormal results are displayed) Labs Reviewed - No data to display  EKG   Radiology DG Knee AP/LAT W/Sunrise Right  Result Date: 12/29/2021 CLINICAL DATA:  Fall EXAM: RIGHT KNEE 3 VIEWS COMPARISON:  None. FINDINGS: Negative for acute fracture. Joint spaces normal without significant degenerative change. Soft tissue swelling below the patella which may be due to contusion. There is irregularity of the tibial tuberosity with well corticated bone fragment. This may be due to chronic injury. IMPRESSION: Negative for acute fracture. Soft tissue swelling distal to the patella which may be due to soft tissue contusion. Probable chronic injury tibial tuberosity. Electronically Signed   By: Marlan Palau  M.D.   On: 12/29/2021 19:47    Procedures Procedures (including critical care time)  Medications Ordered in UC Medications - No data to display  Initial Impression / Assessment and Plan / UC Course  I have reviewed the triage vital signs and the nursing notes.  Pertinent labs & imaging results that were available during my care of the patient were reviewed by me and considered in my medical decision making (see chart for details).  Jennifer right knee, initial encounter  Contusion confirmed by x-ray, negative for fracture, discussed findings with patient, Toradol injection given in office for management of pain and patient placed on  a prednisone burst for 5 days, recommended RICE, heat, pillows for support, daily stretching and activity as tolerated, work note given, given walking referral to orthopedics for persisting pain Final Clinical Impressions(s) / UC Diagnoses   Final diagnoses:  None   Discharge Instructions   None    ED Prescriptions   None    PDMP not reviewed this encounter.   Valinda Hoar, [place] 01/02/22 [phone]

## 2021-12-29 NOTE — Discharge Instructions (Addendum)
X-ray of the right knee negative for break in the bone, able to see bruising of the soft tissue, this will progressively get better with time ? ?Starting tomorrow take prednisone every morning with food for the next 5 days, this is to further help reduce swelling and inflammation ? ?You may continue use of your muscle relaxant at home for additional comfort ? ?You may continue to ice and apply heat to the affected area and 15-minute intervals ? ?You may wrap with Ace bandage or purchase a knee sleeve if you are going going to be doing extended amounts of activity for additional support ? ?While sitting or lying you may elevate on 2 pillows ? ?If symptoms continue to persist or you do not see any improvement after 2 weeks you may follow-up with orthopedic specialist, information listed below ? ? ?

## 2022-01-01 ENCOUNTER — Other Ambulatory Visit: Payer: Self-pay | Admitting: Hematology and Oncology

## 2022-01-09 ENCOUNTER — Encounter: Payer: Self-pay | Admitting: Neurology

## 2022-01-11 DIAGNOSIS — Z0289 Encounter for other administrative examinations: Secondary | ICD-10-CM

## 2022-01-11 NOTE — Telephone Encounter (Signed)
Gave completed/signed FMLA form back to medical records to process for pt. 

## 2022-01-14 LAB — LIPID PANEL
Chol/HDL Ratio: 4.7 ratio — ABNORMAL HIGH (ref 0.0–4.4)
Cholesterol, Total: 204 mg/dL — ABNORMAL HIGH (ref 100–199)
HDL: 43 mg/dL (ref >39)
LDL Chol Calc (NIH): 139 mg/dL — ABNORMAL HIGH (ref 0–99)
Triglycerides: 123 mg/dL (ref 0–149)
VLDL Cholesterol Cal: 22 mg/dL (ref 5–40)

## 2022-01-20 LAB — LIPID PANEL
Chol/HDL Ratio: 4.6 ratio — ABNORMAL HIGH (ref 0.0–4.4)
Cholesterol, Total: 187 mg/dL (ref 100–199)
HDL: 41 mg/dL (ref >39)
LDL Chol Calc (NIH): 124 mg/dL — ABNORMAL HIGH (ref 0–99)
Triglycerides: 120 mg/dL (ref 0–149)
VLDL Cholesterol Cal: 22 mg/dL (ref 5–40)

## 2022-01-26 ENCOUNTER — Telehealth: Payer: Self-pay | Admitting: Internal Medicine

## 2022-01-26 ENCOUNTER — Ambulatory Visit (INDEPENDENT_AMBULATORY_CARE_PROVIDER_SITE_OTHER): Payer: 59 | Admitting: Internal Medicine

## 2022-01-26 ENCOUNTER — Other Ambulatory Visit: Payer: Self-pay | Admitting: Cardiovascular Disease

## 2022-01-26 ENCOUNTER — Encounter: Payer: Self-pay | Admitting: Internal Medicine

## 2022-01-26 VITALS — BP 133/83 | HR 73 | Ht 61.05 in | Wt 129.4 lb

## 2022-01-26 DIAGNOSIS — E7849 Other hyperlipidemia: Secondary | ICD-10-CM | POA: Diagnosis not present

## 2022-01-26 DIAGNOSIS — E785 Hyperlipidemia, unspecified: Secondary | ICD-10-CM | POA: Diagnosis not present

## 2022-01-26 DIAGNOSIS — I739 Peripheral vascular disease, unspecified: Secondary | ICD-10-CM | POA: Diagnosis not present

## 2022-01-26 DIAGNOSIS — I2510 Atherosclerotic heart disease of native coronary artery without angina pectoris: Secondary | ICD-10-CM | POA: Diagnosis not present

## 2022-01-26 MED ORDER — REPATHA SURECLICK 140 MG/ML ~~LOC~~ SOAJ: 1.0000 | SUBCUTANEOUS | 3 refills | Status: DC

## 2022-01-26 NOTE — Telephone Encounter (Signed)
PA for repatha sureclick submitted via CMM ?(Key: [id number]) ?

## 2022-01-26 NOTE — Patient Instructions (Signed)
Medication Instructions:  ?Dr. Debara Pickett recommends Repatha '140mg'$ /mL (PCSK9). This is an injectable cholesterol medication self-administered once every 14 days. This medication will likely need prior approval with your insurance company, which we will work on. If the medication is not approved initially, we may need to do an appeal with your insurance.  ? ?Administer medication in area of fatty tissue such as abdomen, outer thigh, back of upper arm - and rotate site with each injection ?Store medication in refrigerator until ready to administer - allow to sit at room temp for 30 mins - 1 hour prior to injection ?Dispose of medication in a SHARPS container - your pharmacy should be able to direct you on this and proper disposal  ? ?If you need a co-pay card for Repatha: [email or web address]-[email or web address] >> paying for Repatha or red box that says "Repatha Copay Card" in top right ?If you need a co-pay card for Praluent: WedMap.it >> starting & paying for Praluent ? ?Patient Assistance:   ?The PAN Foundation: https://www.panfoundation.org/disease-funds/hypercholesterolemia/ ?-- can sign up for wait list ? ?The [employer] foundation offers assistance to help pay for medication copays.  They will cover copays for all cholesterol lowering meds, including statins, fibrates, omega-3 fish oils like Vascepa, ezetimibe, Repatha, Praluent, Nexletol, Nexlizet.  The cards are usually good for $2,500 or 12 months, whichever comes first. ?Go to healthwellfoundation.org ?Click on ?Apply Now? ?Answer questions as to whom is applying (patient or representative) ?Your disease fund will be ?hypercholesterolemia - Medicare access? ?They will ask questions about finances and which medications you are taking for cholesterol ?When you submit, the approval is usually within minutes.  You will need to print the card information from the site ?You will need to show this information to your pharmacy, they will bill your Medicare Part D plan first -then bill [employer]  Well --for the copay.   ?You can also call them at [phone], although the hold times can be quite long.  ? ? ? ?*If you need a refill on your cardiac medications before your next appointment, please call your pharmacy* ? ? ?Lab Work: ?FASTING lab work to check cholesterol in about 3 months ?If you have labs (blood work) drawn today and your tests are completely normal, you will receive your results only by: ?MyChart Message (if you have MyChart) OR ?A paper copy in the mail ?If you have any lab test that is abnormal or we need to change your treatment, we will call you to review the results. ? ? ?Follow-Up: ?At [facility], you and your health needs are our priority.  As part of our continuing mission to provide you with exceptional heart care, we have created designated Provider Care Teams.  These Care Teams include your primary Cardiologist (physician) and Advanced Practice Providers (APPs -  Physician Assistants and Nurse Practitioners) who all work together to provide you with the care you need, when you need it. ? ?We recommend signing up for the patient portal called "MyChart".  Sign up information is provided on this After Visit Summary.  MyChart is used to connect with patients for Virtual Visits (Telemedicine).  Patients are able to view lab/test results, encounter notes, upcoming appointments, etc.  Non-urgent messages can be sent to your provider as well.   ?To learn more about what you can do with MyChart, go to [email or web address].   ? ?Your next appointment:   ?3-4 months with Dr. Debara Pickett -- lipid clinic or DOD appointment ? ?

## 2022-01-26 NOTE — Progress Notes (Signed)
? ?01/26/2022 ? ? ?PCP: Pcp, No ? ?CC: ?Follow-up dyslipidemia ? ?HPI:  51 year old Female hx of hypertension, dyslipidemia, chronic tobacco use, chronic kidney disease stage III admitted with hypertensive urgency 09/13/14 and 6/10 chest pain EKG showed lateral T-wave inversions on admission and cardiology was consulted. It was felt she had unstable angina and she was placed on nitro drip.  Cardiac catheterization with 2.25X 12 mm Promus remainder DES to the M RCA on 09/14/14 for 90% stenosis.  EF noted 65- 70%.  She did well and discharged without complication.   ? ?She was on numerous BP meds and her BP was running in the 90s to 106 and she felt very weak, lethargic.  The hydralazine was stopped and the cardizem.  She is feeling better.  She has occasional brief chest pressure feeling like the discomfort she was admitted with.  She is here today for follow-up. She is actually doing quite well her questions we discussed where the pneumonia vaccine she is only 81 and essentially in good health told her that was not needed unless she wanted it she does not want a flu vaccine she's had side effects in the past and refuses to take it. She is scheduled for sleep study January 29 2 [facility]. She was recently seen by her primary care for cold symptoms and was placed on Zithromax.  She is feeling better.   ? ?She is asking about exercise which I recommended she could go back to to start slowly but work only a peripheral 1-2 weeks. She did not have a heart attack and her EF is normal.  Her mother was with her today who also has stents but the mother is or bare-metal stents. We discussed the difference between the 2 and the need for Plavix with both, and the length of time she would need to take the Plavix. ? ?She is no longer smoking. She is using the NicoDerm. ? ?I saw Ms. Moten back today in the office for follow-up. She denies any chest pain or worsening shortness of breath. She seems to be stable from a  cardiac standpoint after her stent. She does however have an acute complaint. She tells me that about 2 weeks ago she had a viral upper respiratory infection and had a lot of discharge and pus. She has sore throat associated with this. Subsequently she felt a pop on the right side of her head. After that she had pain and numbness on the right side of her face on her right shoulder and down to the right upper arm and right upper back. She's also had difficulty swallowing due to pain particularly in the right side of her throat as well as some mild drooling. She denies any facial droop, right sided weakness, loss of consciousness, change in vision or other associated symptoms. She denies any headache. Blood pressure has been well controlled and is at goal today at 122/76. She was seen in urgent care and treated for acute pansinusitis by Dr. Elder Cyphers and given amoxicillin which she completed. Lab showed no elevated white count, normal renal function, her cholesterol profile was well controlled, and she had a low ESR 15  (which would likely exclude temporal arteritis). ? ?02/23/2017 ? ?Ms. Marik returns today for follow-up. Unfortunately she suffered a non-ST elevation MI in March 2018. She was found to have total occlusion of the RCA treated with drug-eluting stents. There was some mild nonobstructive disease of the LAD and circumflex and some mild LV systolic dysfunction  with EF 50-55%. She reports doing much better since discharge. She denies any recurrent chest pain but does get short of breath and some fatigue with exertion. She started cardiac rehabilitation but found that it worsened her chronic back pain and now she's discontinued that. She recently saw Ignacia Bayley, NP, in follow-up. She was felt to be doing well although reported having some tachycardia palpitations when awakening from sleep in the middle the night. He was concerned about arrhythmias and placed a monitor. Her monitor showed normal sinus rhythm  without any arrhythmias. She since discontinued that. She is on Brilinta but does not report that her shortness of breath seems to be related to the doses. I advised that she can consider some caffeine after the medication to see if it attenuates some of the symptoms. ? ?05/16/2017 ? ?Mrs. Hannis returns today for follow-up. Recently she's noted that her blood pressures been running much higher although was well controlled in the hospital. She denies any chest pain although has had some mild shortness of breath and occasional sharp chest discomfort. Blood pressure is notably been elevated with systolics in the 160V and diastolics over 371G. Yesterday she was advised to take an additional lisinopril which brought her systolic blood pressure down to the 150s. Typically her blood pressures are elevated more in the evening per her report and in the morning. She was present on a diuretic was taken off of that however due to low blood pressure. Unfortunate, she was recently diagnosed with breast cancer and will need a surgery most likely in September. This will be delayed as she is on dual antiplatelet therapy given her recent stent in March 2018.  ? ?09/07/2017 ? ?Ms. Philemon of returns today for follow-up.  She underwent surgery for breast cancer in September.  She was off of her Effient for that procedure and then restarted it.  Since then she has been undergoing radiation therapy.  She seems to be doing fairly well with this.  She denies any recurrent chest pain or worsening shortness of breath.  Lipid profile in September 2018 showed total cholesterol 120, triglycerides 70, HDL 37 and LDL 69. ? ?12/21/2017 ? ?Ismahan returns today for follow-up of her coronary disease.  She underwent PCI in March 2018.  Is now been a year since then.  She is on dual antiplatelet therapy with aspirin and Effient.  She reports ongoing chest wall pain which is worse with breathing.  The pain is described as sharp and occurs on a daily basis.   Is not necessarily worsened.  This is different pain than she had with her most recent PCI.  There is concern this may be related to chest wall radiation. ? ?09/09/2020 ? ?Mrs. Snead returns today for follow-up.  Recently she was seen by Almyra Deforest, PA-C who had made some adjustments to her medications.  She had surgery and he had switched her to metoprolol, stopping her hydralazine and her amlodipine.  She seems to be tolerating this well however blood pressure is not optimally controlled.  She is notices more recently been around 140/70.  Currently she is on amlodipine 10 mg, lisinopril 20 mg twice daily and metoprolol.  She has not been on any diuretic. ? ?11/11/2020 ? ?Mrs. Deane returns today for follow-up.  Fortunately she was just hospitalized.  She was noted to have chest pain and elevated blood pressure over 626 systolic.  Her medications were adjusted and she was started on low-dose Aldactone.  An echo was performed which  showed normal systolic function and grade 2 diastolic dysfunction.  Subsequently she reports her chest pain is improved and her blood pressure is now more reasonably controlled today at 140/76.  Renal artery Dopplers were performed which demonstrated no evidence of right renal artery stenosis but greater than 60% left renal artery stenosis.  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 ? ?10/07/2021 ? ?Mrs. Nolting is seen today in follow-up.  She has been following with Dr. Gwenlyn Found has been working with her regarding PAD.  She has had significant improvements in that.  Unfortunately her cholesterol remains high.  Her last assessment was in January this year which showed a total cholesterol of 226, LDL 161, HDL 29 and triglycerides 181.  Her target LDL is less than 70.  She reports compliance with atorvastatin 80 mg daily.  This is suggestive of a familial hyperlipidemia.  It would explain her more aggressive coronary disease and PAD given her young age.  She will likely need additional  therapy, probably a PCSK9 inhibitor. ? ?01/26/2022 ? ?Mrs. Riden is seen today in follow-up.  Cholesterol remains high.  A repeat lipid shows some mild improvement but total cholesterol now 187, triglycerides 12

## 2022-01-30 NOTE — Telephone Encounter (Signed)
Request Reference Number: [id number]. REPATHA SURE INJ '140MG'$ /ML is approved through 01/27/2023 ?

## 2022-02-03 ENCOUNTER — Encounter: Payer: Self-pay | Admitting: Neurology

## 2022-02-07 ENCOUNTER — Ambulatory Visit (HOSPITAL_COMMUNITY)
Admission: RE | Admit: 2022-02-07 | Discharge: 2022-02-07 | Disposition: A | Discharge status: Home / Self Care | Payer: 59 | Source: Ambulatory Visit | Attending: Internal Medicine | Admitting: Internal Medicine

## 2022-02-07 ENCOUNTER — Ambulatory Visit (HOSPITAL_BASED_OUTPATIENT_CLINIC_OR_DEPARTMENT_OTHER)
Admission: RE | Admit: 2022-02-07 | Discharge: 2022-02-07 | Disposition: A | Discharge status: Home / Self Care | Payer: 59 | Source: Ambulatory Visit | Attending: Internal Medicine | Admitting: Internal Medicine

## 2022-02-07 DIAGNOSIS — Z95828 Presence of other vascular implants and grafts: Secondary | ICD-10-CM | POA: Insufficient documentation

## 2022-02-07 DIAGNOSIS — I739 Peripheral vascular disease, unspecified: Secondary | ICD-10-CM | POA: Insufficient documentation

## 2022-02-08 ENCOUNTER — Encounter: Payer: Self-pay | Admitting: Neurology

## 2022-02-09 ENCOUNTER — Other Ambulatory Visit: Payer: Self-pay | Admitting: *Deleted

## 2022-02-09 MED ORDER — TRAMADOL HCL 50 MG PO TABS: 50.0000 mg | ORAL_TABLET | Freq: Two times a day (BID) | ORAL | 1 refills | Status: DC | PRN

## 2022-03-07 ENCOUNTER — Other Ambulatory Visit: Payer: Self-pay | Admitting: Neurology

## 2022-04-07 ENCOUNTER — Encounter: Payer: Self-pay | Admitting: Hematology and Oncology

## 2022-04-26 ENCOUNTER — Ambulatory Visit: Payer: 59 | Admitting: Neurology

## 2022-04-26 ENCOUNTER — Encounter: Payer: Self-pay | Admitting: Neurology

## 2022-05-09 ENCOUNTER — Encounter: Payer: Self-pay | Admitting: Neurology

## 2022-05-09 ENCOUNTER — Ambulatory Visit: Payer: 59 | Admitting: Neurology

## 2022-05-09 VITALS — BP 131/67 | HR 70 | Ht 61.0 in | Wt 132.0 lb

## 2022-05-09 DIAGNOSIS — G373 Acute transverse myelitis in demyelinating disease of central nervous system: Secondary | ICD-10-CM

## 2022-05-09 DIAGNOSIS — M5412 Radiculopathy, cervical region: Secondary | ICD-10-CM | POA: Diagnosis not present

## 2022-05-09 DIAGNOSIS — M4802 Spinal stenosis, cervical region: Secondary | ICD-10-CM | POA: Diagnosis not present

## 2022-05-09 DIAGNOSIS — R208 Other disturbances of skin sensation: Secondary | ICD-10-CM | POA: Diagnosis not present

## 2022-05-09 NOTE — Progress Notes (Signed)
[facility]  PATIENT: Anna Henson DOB: 07/22/1971  REFERRING DOCTOR OR PCP:  Rikki Spearing _________________________________   HISTORICAL  CHIEF COMPLAINT:  Chief Complaint  Patient presents with   Follow-up    RM 1, alone. Last seen 10/24/21. Saw Dr. Wallene Huh (had inj in neck, hit nerve, caused pain).  Does not want to go back see him.     HISTORY OF PRESENT ILLNESS:  Spencer Peterkin is a 51 yo woman with a transverse myelitis in late January 2017.    Follow-Up 05/09/2022 She gets dysesthesias in her neck and arm that fluctuate.     These are electric like sensations radiating down her spine and into the ribs when more severe.   Medications have not helped..  Pain is worse when the neck is touched.  Pain is sometimes better with a heating pad.   She notes numbness is worse n her hands, left > right.  MRI 12/2019 showed resolution of the cervical spine lesion seen previously (we discussed that despite no lesion now on MRI a scar could still be left behind).    She also had right > left arm pain.  MRI has shows DJD at [place] (more to right)  and C6C7 (more to left).  Also has spinal stenosis at [facility].     She takes gabapentin 900 mg po tid , oxcarbazapine 300-300-600 mg , Robaxin and tramadol 50 mg po bid, baclofen 10 mg 3-4 times a day.  These medications help tingling pain some. She feels worse than last year.    She had an ESI (Dr. Chrystine Oiler).   The procedure was very painful and she did not note a benefit.   She prefers not to do surgery.     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  She does have degenerative changes.  There is moderate spinal stenosis at C5-C6 with moderate right foraminal  narrowing.  At C6-C7, she has left-sided disc osteophyte complex that could affect the left C7 nerve root.  December 24, 2019 MRI of the thoracic spine showed a normal spinal cord and no significant degenerative changes.  Transverse myelitis history: Transverse myelitis history:    In late January 2017,  she had a right sided headache and went to bed.  When she woke up, she noted numbness in the right neck, shoulder and arm.   Numbness worsened over the next fe days.   She went to her PCP an was told she might have sinusitis and Amoxicillin was prescribed.   After a couple more days, she saw another doctor and had a neck soft tissue CT scan.   That CT scan showed that she had an increase of adenoid tissue and reactive type lymph nodes.  By that time, numbness extended to the fingertips on the riht but not into her leg.   She was told to go to the emergency room for further evaluation. In the emergency room she had an MRI of the cervical spine and the brain. The MRI of the cervical spine showed a focus that extended from the lower medulla on the right down to C5 posteriorly and more medially within the spinal  cord. She also had some enhancement around the level of the cervical medullary junction. She was admitted to the hospital for further treatment and testing.    She was treated with 5 days of IV Solu-Medrol. During that time, her symptoms of numbness improved.  She has not returned to baseline.    She had many tests performed including lumbar puncture for CSF analysis and a thoracic spine MRI and serology blood tests. For the most part, the evaluation was negative.  The 02/02/16 cervical spine shows resolution of the prior enhancing focus that was seen at the cervicomedullary junction.    Data:     MRI  January 2017 has a longitudinal lesion extending from the lower medulla on the right down to C5/C6 on  The enhancement does not extend up to the medulla.  Cervical spine also showed mild spinal stenosis at  C3-C4 and mild to moderate spinal stenosis at C5-C6 and mild spinal stenosis at C6-C7. There is some foraminal narrowing at those levels but no definite nerve root compression.  Elsewhere the brain, there were some subcortical and deep white matter T2/FLAIR hyperintense foci but not in a pattern that would be typical for MS. The thoracic spine was normal.   The 02/02/16 cervical spine MRI shows resolution of the prior enhancing focus that was seen at the cervicomedullary/upper cervical spine  .    December 24, 2019 MRI of the cervical spine is normal (MRI February 2017 has shown T2 hyperintensity from the medulla to the mid cervical spine) she does.  She does have degenerative changes.  There is moderate spinal stenosis at C5-C6 with moderate right foraminal narrowing.  At C6-C7, she has left-sided disc osteophyte complex that could affect the left C7 nerve root.  December 24, 2019 MRI of the thoracic spine showed a normal spinal cord and no significant degenerative changes.  2017 laboratory tests n. Neuromyelitis optica antibodies are negative. She did not have oligoclonal bands. ANCA, ANA, SSA/SSB, ACE and Vit B-12 were all normal or negative.  She had Epstein-Barr IgG but not IgM. CSF cell counts did not show increase in white blood cells.  REVIEW OF SYSTEMS: Constitutional: No fevers, chills, sweats, or change in appetite.  She notes some fatigue Eyes: No visual changes, double vision, eye pain Ear, nose and throat: No hearing loss, ear pain, nasal congestion, sore throat Cardiovascular: No chest pain, palpitations.   H/O angina, CAD with stent Respiratory:  No shortness of breath at rest or with exertion.   No wheezes GastrointestinaI: No nausea, vomiting, diarrhea, abdominal pain, fecal incontinence Genitourinary:  Mild urgency and frequency.  No nocturia. Musculoskeletal:  No neck pain, back pain Integumentary: No rash, pruritus, skin lesions Neurological: as above Psychiatric: No depression at  this time.  No anxiety Endocrine: No palpitations, diaphoresis, change in appetite, change in weigh or increased thirst Hematologic/Lymphatic:  No anemia, purpura, petechiae. Allergic/Immunologic: No itchy/runny eyes, nasal congestion, recent allergic reactions, rashes  ALLERGIES: Allergies  Allergen Reactions   Pork-Derived Products Other (See Comments)    Does NOT eat pork    HOME MEDICATIONS:  Current Outpatient Medications:    amLODipine (NORVASC) 10 MG tablet, Take 1 tablet (10 mg total) by mouth daily., Disp: 90 tablet, Rfl: 0   anastrozole (ARIMIDEX) 1 MG tablet, Take 1 tablet (1 mg total) by mouth daily., Disp: 90 tablet, Rfl: 3   aspirin 81 MG chewable tablet, Chew 1 tablet (81 mg total) by mouth daily., Disp: , Rfl:    atorvastatin (  LIPITOR) 80 MG tablet, TAKE 1 TABLET BY MOUTH EVERY DAY, Disp: 90 tablet, Rfl: 1   baclofen (LIORESAL) 10 MG tablet, One po qid (Patient taking differently: Take 10 mg by mouth 4 (four) times daily as needed for muscle spasms.), Disp: 120 tablet, Rfl: 11   clopidogrel (PLAVIX) 75 MG tablet, Take 1 tablet (75 mg total) by mouth daily with breakfast., Disp: 90 tablet, Rfl: 1   Evolocumab (REPATHA SURECLICK) 366 MG/ML SOAJ, Inject 1 Dose into the skin every 14 (fourteen) days., Disp: 6 mL, Rfl: 3   fluticasone (FLONASE) 50 MCG/ACT nasal spray, Place 1-2 sprays into both nostrils daily as needed for allergies or rhinitis., Disp: , Rfl:    furosemide (LASIX) 20 MG tablet, TAKE 1 TABLET EVERY DAY FOR FLUID RETENTION, Disp: 30 tablet, Rfl: 2   gabapentin (NEURONTIN) 300 MG capsule, Take 3 capsules (900 mg total) by mouth 3 (three) times daily., Disp: 270 capsule, Rfl: 3   Isopropyl Alcohol (SWIMMERS EAR DROPS OT), Place 2 drops into both ears daily as needed (water in ears)., Disp: , Rfl:    methocarbamol (ROBAXIN) 500 MG tablet, Take 1 tablet (500 mg total) by mouth 3 (three) times daily as needed for muscle spasms., Disp: 90 tablet, Rfl: 5   nitroGLYCERIN  (NITROSTAT) 0.4 MG SL tablet, PLACE 1 TABLET UNDER THE TONGUE EVERY 5 MINUTES AS NEEDED FOR CHEST PAIN, Disp: 25 tablet, Rfl: 4   Oxcarbazepine (TRILEPTAL) 300 MG tablet, TAKE 1 TABLET EVERY MORNING, 1 TABLET EVERY EVENING, AND 2 TABLET EVERY NIGHT AT BEDTIME., Disp: 120 tablet, Rfl: 3   predniSONE (DELTASONE) 20 MG tablet, Take 2 tablets (40 mg total) by mouth daily., Disp: 10 tablet, Rfl: 0   Probiotic Product (PROBIOTIC-10 PO), Take 1 capsule by mouth daily., Disp: , Rfl:    spironolactone (ALDACTONE) 50 MG tablet, Take 1 tablet each morning and 1/2 tablet each evening., Disp: 135 tablet, Rfl: 1   traMADol (ULTRAM) 50 MG tablet, Take 1 tablet (50 mg total) by mouth 2 (two) times daily as needed., Disp: 60 tablet, Rfl: 1   venlafaxine XR (EFFEXOR-XR) 75 MG 24 hr capsule, TAKE 1 CAPSULE BY MOUTH DAILY WITH BREAKFAST, Disp: 90 capsule, Rfl: 1   lisinopril (ZESTRIL) 40 MG tablet, Take 1 tablet (40 mg total) by mouth daily., Disp: 90 tablet, Rfl: 3  PAST MEDICAL HISTORY: Past Medical History:  Diagnosis Date   Anemia    Anxiety    Bipolar disorder in full remission ([place])    CAD in [place] artery cardiologist--  dr hilty   a. 09/2014 Cath/PCI in setting of [place] - s/p 2.25 x 12 mm Promus Premier DES to mid RCA;  b. 11/2016 Myoview: EF 56%, no ischemia;  c. 12/2016 NSTEMI/PCI: LM nl, LAd 25p, LCX 30p, RCA 100p (2.75x38 Promus Premier DES), mRCA 10 ISR, EF 50-55%.   CKD (chronic kidney disease), stage III ([place])    Depression    Genetic testing 04/23/2017   Ms. Sleeper underwent genetic counseling and testing for hereditary cancer syndromes on 04/05/2017. Her results were negative for mutations in all 46 genes analyzed by [facility]'s 46-gene Common Hereditary Cancers Panel. Genes analyzed include: APC, ATM, AXIN2, BARD1, BMPR1A, BRCA1, BRCA2, BRIP1, CDH1, CDKN2A, CHEK2, CTNNA1, DICER1, EPCAM, GREM1, HOXB13, KIT, MEN1, MLH1, MSH2, MSH3, MSH6, MUTYH, NBN,   GERD (gastroesophageal reflux disease)    Headache     History of external beam radiation therapy    right breast 07-27-2017  to 09-25-2017   History of  non-ST elevation myocardial infarction (NSTEMI)    HOCM (hypertrophic obstructive cardiomyopathy) ([place]) followed by cardiology   a. 12/2016 Echo: EF 65-70%,  mod conc LVH, dynamic obstruction @ rest, peak velocity of 291 cm/sec w/ peak gradient of 66mHg, no rwma, Gr1 DD, triv TR, PASP 140mg.   Hyperlipidemia    Hypertension    Malignant neoplasm of lower-outer quadrant of right breast of female, estrogen receptor positive [facility] dr guLindi Adie dx 06/ 2018--- right breast invasive lobular carcinoma, ductal carcinoma w/ LCIS---- 06-21-2017 s/p right breast lumpecotmy w/ sln disseciton;   completed radiation 09-25-2017;  on tamoxifen   [facility]   S/P drug eluting coronary stent placement    09-14-2014---  PCI with DES x1 to midRCA;  12-31-2016  PCI with DES x1 to proxRCA   [facility] dr sa[facility]lecia Shelling 01/ 2017  dx transverse myelitis w/ right side numbness (09-01-2019  currently lower extremity weakness, mucsle spasms, and gait disturbance)   Wears glasses     PAST SURGICAL HISTORY: Past Surgical History:  Procedure Laterality Date   ABDOMINAL AORTOGRAM W/LOWER EXTREMITY N/A 08/11/2021   Procedure: ABDOMINAL AORTOGRAM W/LOWER EXTREMITY;  Surgeon: BeLorretta HarpMD;  [facility];  Service: Cardiovascular;  Laterality: N/A;   BREAST LUMPECTOMY WITH RADIOACTIVE SEED AND SENTINEL LYMPH NODE BIOPSY Right 06/21/2017   Procedure: RIGHT BREAST BRACKETED SEED GUIDED LUMPECTOMY AND SENTINEL LYMPH NODE BIOPSY;  Surgeon: ToJovita KussmaulMD;  Location: [facility] Service: General;  Laterality: Right;   CORONARY/GRAFT ACUTE MI REVASCULARIZATION N/A 12/31/2016   Procedure: Coronary/Graft Acute MI Revascularization;  Surgeon: MiSherren MochaMD;  [place] [facility];  Service: Cardiovascular;  Laterality: N/A;   DILATATION &  CURETTAGE/HYSTEROSCOPY WITH MYOSURE N/A 09/02/2019   Procedure: DILATATION & CURETTAGE/HYSTEROSCOPY WITH MYOSURE;  Surgeon: FoAloha GellMD;  [place] [facility] Service: Gynecology;  Laterality: N/A;   HERNIA REPAIR     LEFT HEART CATH AND CORONARY ANGIOGRAPHY N/A 12/31/2016   Procedure: Left Heart Cath and Coronary Angiography;  Surgeon: MiSherren MochaMD;  [place];  Service: Cardiovascular;  Laterality: N/A;   LEFT HEART CATHETERIZATION WITH CORONARY ANGIOGRAM N/A 09/14/2014   Procedure: LEFT HEART CATHETERIZATION WITH CORONARY ANGIOGRAM;  Surgeon: ChBurnell BlanksMD;  [facility];  Service: Cardiovascular;  Laterality: N/A;   MASS EXCISION N/A 03/30/2020   Procedure: EXCISION MASS RIGHT LABIA MINORA;  Surgeon: TuLafonda MossesMD;  Location: [facility];  Service: Gynecology;  Laterality: N/A;   PERCUTANEOUS CORONARY STENT INTERVENTION (PCI-S)  09/14/2014   Procedure: PERCUTANEOUS CORONARY STENT INTERVENTION (PCI-S);  Surgeon: ChBurnell BlanksMD;  [facility];  Service: Cardiovascular;;   PERIPHERAL VASCULAR INTERVENTION  08/11/2021   Procedure: PERIPHERAL VASCULAR INTERVENTION;  Surgeon: BeLorretta HarpMD;  [place] [facility];  Service: Cardiovascular;;   ROBOTIC ASSISTED SALPINGO OOPHERECTOMY Bilateral 03/30/2020   Procedure: XI ROBOTIC ASSISTED SALPINGO OOPHORECTOMY;  Surgeon: TuLafonda MossesMD;  Location: [facility];  Service: Gynecology;  Laterality: Bilateral;   TONSILLECTOMY  [id number] UMBILICAL HERNIA REPAIR  2001; 2005    FAMILY HISTORY: Family History  Problem Relation Age of Onset   Diabetes Mother    Heart disease Mother    Hyperlipidemia Mother    Hypertension Mother    Breast cancer Mother    Lung cancer Father    Drug abuse Father  Brain cancer Father 6   Bone cancer Father 58   Drug abuse Sister    Mental illness Brother    Mental illness Maternal Grandmother    Stomach  cancer Paternal Grandmother 66       d.75      DIAGNOSTIC DATA (LABS, IMAGING, TESTING) - I reviewed patient records, labs, notes, testing and imaging myself where available.  Lab Results  Component Value Date   WBC 8.3 08/12/2021   HGB 11.7 (L) 08/12/2021   HCT 33.7 (L) 08/12/2021   MCV 90.3 08/12/2021   PLT 341 08/12/2021   +__________________________________________________________   Today's Vitals   05/09/22 1532  BP: 131/67  Pulse: 70  Weight: 132 lb (59.9 kg)  Height: 5' 1" (1.549 m)   Body mass index is 24.94 kg/m.  General: The patient is well-developed and well-nourished and in no acute distress   Neurologic Exam   Mental status: The patient is alert and oriented x 3 at the time of the examination. The patient has apparent normal recent and remote memory, with an apparently normal attention span and concentration ability.   Speech is normal.   Cranial nerves: Extraocular movements are full.   Facial strength and sensation is normal. Trapezius strength is normal    No obvious hearing deficits are noted.   Motor:  Muscle bulk is normal.  Tone is increased in legs, right greater than left. Strength is 5/5 except 4+/5 right triceps.   Sensory: Reduced right C6 and C7 sensation in hand.  Vibration are symmetric in arms but reduced left leg vibration sensation..      Gait and station: Station is normal.   Gait is spastic (right) and wide. Tandem gait is poor.   Romberg negative.     Reflexes: Deep tendon reflexes are symmetric and increased in legs, right greater than left. There is spread at the knees.   No ankle clonus today.   ________________________________________________________   Transverse myelitis (HCC)  Cervical spinal stenosis  Dysesthesia  Cervical radiculopathy   1.   Referral to Neurosurgery for C5C6 and C6C7 stenosis and DJD causing right C6/C7 radicuulopathy 2.    For symptoms:  oxcarbazepine 1200/day in split dose.  Continue gabapentin,  baclofen and tramadol.  Will need repeat MRI cervical spine and brain sometime in 2023 to assess for progression that would be worrisome for MS.   3.  Stay active and exercise as tolerated 4.   rtc 6 months, sooner if new or worsening issues     Sharlet Notaro A. Felecia Shelling, MD, PhD 04/15/4127, 7:86 PM Certified in Neurology, Clinical Neurophysiology, Sleep Medicine, Pain Medicine and Neuroimaging  [facility] [address] [place], [place] [address] [phone]

## 2022-05-10 ENCOUNTER — Telehealth: Payer: Self-pay | Admitting: Neurology

## 2022-05-10 NOTE — Telephone Encounter (Signed)
Sent to [facility] ph # [phone]

## 2022-05-16 ENCOUNTER — Encounter: Payer: Self-pay | Admitting: Neurology

## 2022-05-30 ENCOUNTER — Ambulatory Visit
Admission: EM | Admit: 2022-05-30 | Discharge: 2022-05-30 | Disposition: A | Discharge status: Home / Self Care | Payer: 59 | Attending: Emergency Medicine | Admitting: Emergency Medicine

## 2022-05-30 DIAGNOSIS — T63441A Toxic effect of venom of bees, accidental (unintentional), initial encounter: Secondary | ICD-10-CM | POA: Diagnosis not present

## 2022-05-30 MED ORDER — DIPHENHYDRAMINE HCL 25 MG PO CAPS
25.0000 mg | ORAL_CAPSULE | Freq: Once | ORAL | Status: AC
  Administered 2022-05-30: 25 mg via ORAL

## 2022-05-30 MED ORDER — DIPHENHYDRAMINE HCL 25 MG PO TABS: 25.0000 mg | ORAL_TABLET | Freq: Four times a day (QID) | ORAL | 0 refills | Status: DC

## 2022-05-30 NOTE — Discharge Instructions (Addendum)
I provided you with some patient education regarding bee stings that I hope you find helpful.  I provided you with Benadryl 25 mg during your visit today.  I have sent a prescription for Benadryl to your pharmacy.  Please continue taking 1 tablet every 6 hours for the next 2 to 3 days to ensure that you do not develop an acute allergic reaction.  Thank you for visiting urgent care today.

## 2022-05-30 NOTE — ED Triage Notes (Signed)
The pt got stung by a bee to her left ankle today.

## 2022-05-30 NOTE — ED Provider Notes (Signed)
[facility]    CSN: [id number] Arrival date & time: 05/30/22  1717    HISTORY   Chief Complaint  Patient presents with   Insect Bite   HPI Anna Henson is a pleasant, 51 y.o. female who presents to urgent care today. Patient complains of being stung by bee on her left ankle at 9 AM today.  Patient states it still hurts.  Patient states last time she was stung by bee was about 30 years ago.  Patient denies history of severe reaction to bee stings.  Patient states she has not tried anything to alleviate her pain from the bee sting.  The history is provided by the patient.   Past Medical History:  Diagnosis Date   Anemia    Anxiety    Bipolar disorder in full remission ([place])    CAD in native artery cardiologist--  dr hilty   a. 09/2014 Cath/PCI in setting of [place] - s/p 2.25 x 12 mm Promus Premier DES to mid RCA;  b. 11/2016 Myoview: EF 56%, no ischemia;  c. 12/2016 NSTEMI/PCI: LM nl, LAd 25p, LCX 30p, RCA 100p (2.75x38 Promus Premier DES), mRCA 10 ISR, EF 50-55%.   CKD (chronic kidney disease), stage III ([place])    Depression    Genetic testing 04/23/2017   Ms. Cabal underwent genetic counseling and testing for hereditary cancer syndromes on 04/05/2017. Her results were negative for mutations in all 46 genes analyzed by [facility]'s 46-gene Common Hereditary Cancers Panel. Genes analyzed include: APC, ATM, AXIN2, BARD1, BMPR1A, BRCA1, BRCA2, BRIP1, CDH1, CDKN2A, CHEK2, CTNNA1, DICER1, EPCAM, GREM1, HOXB13, KIT, MEN1, MLH1, MSH2, MSH3, MSH6, MUTYH, NBN,   GERD (gastroesophageal reflux disease)    Headache    History of external beam radiation therapy    right breast 07-27-2017  to 09-25-2017   History of non-ST elevation myocardial infarction (NSTEMI)    HOCM (hypertrophic obstructive cardiomyopathy) ([place]) followed by cardiology   a. 12/2016 Echo: EF 65-70%,  mod conc LVH, dynamic obstruction @ rest, peak velocity of 291 cm/sec w/ peak gradient of 41mHg, no rwma, Gr1 DD, triv TR,  PASP 168mg.   Hyperlipidemia    Hypertension    Malignant neoplasm of lower-outer quadrant of right breast of female, estrogen receptor positive [facility] dr guLindi Adie dx 06/ 2018--- right breast invasive lobular carcinoma, ductal carcinoma w/ LCIS---- 06-21-2017 s/p right breast lumpecotmy w/ sln disseciton;   completed radiation 09-25-2017;  on tamoxifen   Myocardial infarction ([facility]   2017   S/P drug eluting coronary stent placement    09-14-2014---  PCI with DES x1 to midRCA;  12-31-2016  PCI with DES x1 to proxRCA   Trans[facility] dr saFelecia Shelling 01/ 2017  dx transverse myelitis w/ right side numbness (09-01-2019  currently lower extremity weakness, mucsle spasms, and gait disturbance)   Wears glasses    Patient Active Problem List   Diagnosis Date Noted   Claudication in peripheral vascular disease (HC[facility]11/12/2020   Peripheral arterial disease (HCT[place]09/06/2021   Hyponatremia 10/14/2020   Chronic diastolic CHF (congestive heart failure) ([facility] [place]12/31/2021   Neuropathic pain 07/06/2020   Cervical spinal stenosis 06/15/2020   Lhermitte's sign positive 06/15/2020   Labial cyst    Complex cyst of right ovary 12/05/2019   Hypertensive urgency 07/28/2018   Chronic anemia    MDD (major depressive disorder), recurrent episode, severe ([facility]11/20/2018   Neck stiffness 08/22/2017   Abnormal finding on MRI of brain 08/22/2017  HOCM (hypertrophic obstructive cardiomyopathy) ([place])    Chest pain 05/06/2017   Genetic testing 04/23/2017   Malignant neoplasm of lower-outer quadrant of right breast of female, estrogen receptor positive ([place]) 03/29/2017   Dysesthesia 03/12/2017   Palpitations 02/23/2017   S/P cardiac cath (12/2016) a. acute total occlusion of the RCA tx w/PCI DES, b. mild nonobs LAD/LCx stenosis, c. 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   Acute-on-chronic kidney injury ([place]) 11/12/2015   Neck pain 11/12/2015   Hypokalemia 11/12/2015   Abnormal MRI, neck 11/12/2015   Numbness 11/12/2015   Hypertensive crisis    Hyperlipidemia    Coronary artery disease involving native coronary artery of native heart without angina pectoris    Facial numbness    Right arm numbness    Throat pain 11/09/2015   Drooling 11/09/2015   CAD (coronary artery disease), native coronary artery 09/15/2014   Headache 09/15/2014   UTI (urinary tract infection): Probable 09/14/2014   Candida infection of genital region 09/14/2014   Unstable angina ([place]) 09/13/2014   HLD (hyperlipidemia) 09/13/2014   Otitis 09/13/2014   ACS (acute coronary syndrome) ([place]) 09/13/2014   Tobacco abuse 09/13/2014   Essential hypertension 09/09/2014   Past Surgical History:  Procedure Laterality Date   ABDOMINAL AORTOGRAM W/LOWER EXTREMITY N/A 08/11/2021   Procedure: ABDOMINAL AORTOGRAM W/LOWER EXTREMITY;  Surgeon: Lorretta Harp, MD;  Location: [facility] [facility];  Service: Cardiovascular;  Laterality: N/A;   BREAST LUMPECTOMY WITH RADIOACTIVE SEED AND SENTINEL LYMPH NODE BIOPSY Right 06/21/2017   Procedure: RIGHT BREAST BRACKETED SEED GUIDED LUMPECTOMY AND SENTINEL LYMPH NODE BIOPSY;  Surgeon: Jovita Kussmaul, MD;  Location: [facility];  Service: General;  Laterality: Right;   CORONARY/GRAFT ACUTE MI REVASCULARIZATION N/A 12/31/2016   Procedure: Coronary/Graft Acute MI Revascularization;  Surgeon: Sherren Mocha, MD;  Location: [place] [facility];  Service: Cardiovascular;  Laterality: N/A;   DILATATION & CURETTAGE/HYSTEROSCOPY WITH MYOSURE N/A 09/02/2019   Procedure: DILATATION & CURETTAGE/HYSTEROSCOPY WITH MYOSURE;  Surgeon:  Aloha Gell, MD;  Location: [facility];  Service: Gynecology;  Laterality: N/A;   HERNIA REPAIR     LEFT HEART CATH AND CORONARY ANGIOGRAPHY N/A 12/31/2016   Procedure: Left Heart Cath and Coronary Angiography;  Surgeon: Sherren Mocha, MD;  Location: [place] [facility];  Service: Cardiovascular;  Laterality: N/A;   LEFT HEART CATHETERIZATION WITH CORONARY ANGIOGRAM N/A 09/14/2014   Procedure: LEFT HEART CATHETERIZATION WITH CORONARY ANGIOGRAM;  Surgeon: Burnell Blanks, MD;  Location: [facility] C[facility] LAB;  Service: Cardiovascular;  Laterality: N/A;   MASS EXCISION N/A 03/30/2020   Procedure: EXCISION MASS RIGHT LABIA MINORA;  Surgeon: Lafonda Mosses, MD;  [facility];  Service: Gynecology;  Laterality: N/A;   PERCUTANEOUS CORONARY STENT INTERVENTION (PCI-S)  09/14/2014   Procedure: PERCUTANEOUS CORONARY STENT INTERVENTION (PCI-S);  Surgeon: Burnell Blanks, MD;  Location: [facility] C[facility] LAB;  Service: Cardiovascular;;   PERIPHERAL VASCULAR INTERVENTION  08/11/2021   Procedure: PERIPHERAL VASCULAR INTERVENTION;  Surgeon: Lorretta Harp, MD;  Location: [facility];  Service: Cardiovascular;;   ROBOTIC ASSISTED SALPINGO OOPHERECTOMY Bilateral 03/30/2020   Procedure: XI ROBOTIC  ASSISTED SALPINGO OOPHORECTOMY;  Surgeon: Lafonda Mosses, MD;  Location[facility];  Service: Gynecology;  Laterality: Bilateral;   TONSILLECTOMY  8144   UMBILICAL HERNIA REPAIR  2001; 2005   OB History   No obstetric history on file.    Home Medications    Prior to Admission medications   Medication Sig Start Date End Date Taking? Authorizing Provider  amLODipine (NORVASC) 10 MG tablet Take 1 tablet (10 mg total) by mouth daily. 07/21/21   Hilty, Nadean Corwin, MD  anastrozole (ARIMIDEX) 1 MG tablet Take 1 tablet (1 mg total) by mouth daily. 11/29/21   Nicholas Lose, MD  aspirin 81 MG chewable tablet Chew 1 tablet (81 mg total) by mouth daily. 09/16/14   Nita Sells, MD   atorvastatin (LIPITOR) 80 MG tablet TAKE 1 TABLET BY MOUTH EVERY DAY 09/13/21   Lorretta Harp, MD  baclofen (LIORESAL) 10 MG tablet One po qid Patient taking differently: Take 10 mg by mouth 4 (four) times daily as needed for muscle spasms. 04/12/21   Sater, Nanine Means, MD  clopidogrel (PLAVIX) 75 MG tablet Take 1 tablet (75 mg total) by mouth daily with breakfast. 09/09/21   Lorretta Harp, MD  Evolocumab (REPATHA SURECLICK) 818 MG/ML SOAJ Inject 1 Dose into the skin every 14 (fourteen) days. 01/26/22   Hilty, Nadean Corwin, MD  fluticasone (FLONASE) 50 MCG/ACT nasal spray Place 1-2 sprays into both nostrils daily as needed for allergies or rhinitis.    [provider]  furosemide (LASIX) 20 MG tablet TAKE 1 TABLET EVERY DAY FOR FLUID RETENTION 09/26/21   Hilty, Nadean Corwin, MD  gabapentin (NEURONTIN) 300 MG capsule Take 3 capsules (900 mg total) by mouth 3 (three) times daily. 03/07/22   Sater, Nanine Means, MD  Isopropyl Alcohol (SWIMMERS EAR DROPS OT) Place 2 drops into both ears daily as needed (water in ears).    [provider]  lisinopril (ZESTRIL) 40 MG tablet Take 1 tablet (40 mg total) by mouth daily. 01/14/21 01/26/22  Lorretta Harp, MD  methocarbamol (ROBAXIN) 500 MG tablet Take 1 tablet (500 mg total) by mouth 3 (three) times daily as needed for muscle spasms. 12/14/21   Sater, Nanine Means, MD  nitroGLYCERIN (NITROSTAT) 0.4 MG SL tablet PLACE 1 TABLET UNDER THE TONGUE EVERY 5 MINUTES AS NEEDED FOR CHEST PAIN 07/15/21   Hilty, Nadean Corwin, MD  Oxcarbazepine (TRILEPTAL) 300 MG tablet TAKE 1 TABLET EVERY MORNING, 1 TABLET EVERY EVENING, AND 2 TABLET EVERY NIGHT AT BEDTIME. 03/07/22   Sater, Nanine Means, MD  predniSONE (DELTASONE) 20 MG tablet Take 2 tablets (40 mg total) by mouth daily. 12/29/21   Hans Eden, NP  Probiotic Product (PROBIOTIC-10 PO) Take 1 capsule by mouth daily.    [provider]  spironolactone (ALDACTONE) 50 MG tablet Take 1 tablet each morning and 1/2  tablet each evening. 01/18/21   Lorretta Harp, MD  traMADol (ULTRAM) 50 MG tablet Take 1 tablet (50 mg total) by mouth 2 (two) times daily as needed. 02/09/22   Sater, Nanine Means, MD  venlafaxine XR (EFFEXOR-XR) 75 MG 24 hr capsule TAKE 1 CAPSULE BY MOUTH DAILY WITH BREAKFAST 01/02/22   Nicholas Lose, MD    Family History Family History  Problem Relation Age of Onset   Diabetes Mother    Heart disease Mother    Hyperlipidemia Mother    Hypertension Mother    Breast cancer Mother    Lung cancer Father    Drug  abuse Father    Brain cancer Father 40   Bone cancer Father 87   Drug abuse Sister    Mental illness Brother    Mental illness Maternal Grandmother    Stomach cancer Paternal Grandmother 79       d.75   Social History Social History   Tobacco Use   Smoking status: Former    Packs/day: 0.50    Years: 30.00    Total pack years: 15.00    Types: Cigarettes    Quit date: 10/05/2015    Years since quitting: 6.6   Smokeless tobacco: Never  Vaping Use   Vaping Use: Former  Substance Use Topics   Alcohol use: Not Currently    Alcohol/week: 2.0 standard drinks of alcohol    Types: 2 Cans of beer per week    Comment: occas.   Drug use: No   Allergies   Patient has no active allergies.  Review of Systems Review of Systems Pertinent findings revealed after performing a 14 point review of systems has been noted in the history of present illness.  Physical Exam Triage Vital Signs ED Triage Vitals  Enc Vitals Group     BP 08/05/21 0827 (!) 147/82     Pulse Rate 08/05/21 0827 72     Resp 08/05/21 0827 18     Temp 08/05/21 0827 98.3 F (36.8 C)     Temp Source 08/05/21 0827 Oral     SpO2 08/05/21 0827 98 %     Weight --      Height --      Head Circumference --      Peak Flow --      Pain Score 08/05/21 0826 5     Pain Loc --      Pain Edu? --      Excl. in [place]? --   No data found.  Updated Vital Signs BP (!) 155/93 (BP Location: Left Arm)   Pulse 64   Temp  98.2 F (36.8 C) (Oral)   Resp 18   LMP 03/06/2020   SpO2 98%   Physical Exam Vitals and nursing note reviewed.  Constitutional:      General: She is awake. She is not in acute distress.    Appearance: Normal appearance. She is well-developed, well-groomed and normal weight. She is not ill-appearing, toxic-appearing or diaphoretic.  HENT:     Head: Normocephalic and atraumatic.     Nose: Nose normal.     Mouth/Throat:     Mouth: Mucous membranes are moist.  Eyes:     Pupils: Pupils are equal, round, and reactive to light.  Cardiovascular:     Rate and Rhythm: Normal rate and regular rhythm.  Pulmonary:     Effort: Pulmonary effort is normal.     Breath sounds: Normal breath sounds.  Musculoskeletal:        General: Normal range of motion.     Cervical back: Normal range of motion and neck supple.  Skin:    General: Skin is warm and dry.     Findings: No erythema, lesion or rash.  Neurological:     General: No focal deficit present.     Mental Status: She is alert and oriented to person, place, and time. Mental status is at baseline.  Psychiatric:        Mood and Affect: Mood normal.        Behavior: Behavior normal. Behavior is cooperative.        Thought  Content: Thought content normal.        Judgment: Judgment normal.     Visual Acuity Right Eye Distance:   Left Eye Distance:   Bilateral Distance:    Right Eye Near:   Left Eye Near:    Bilateral Near:     UC Couse / Diagnostics / Procedures:     Radiology No results found.  Procedures Procedures (including critical care time) EKG  Pending results:  Labs Reviewed - No data to display  Medications Ordered in UC: Medications  diphenhydrAMINE (BENADRYL) capsule 25 mg (has no administration in time range)    UC Diagnoses / Final Clinical Impressions(s)   I have reviewed the triage vital signs and the nursing notes.  Pertinent labs & imaging results that were available during my care of the patient  were reviewed by me and considered in my medical decision making (see chart for details).    Final diagnoses:  Bee sting, accidental or unintentional, initial encounter   No visible lesion was appreciated on her left ankle.  Patient was provided Benadryl 25 mg p.o. during her visit today and a prescription for the same to take every 6 hours for the next 72 hours.  Return precautions advised.  Patient education provided. ED Prescriptions     Medication Sig Dispense Auth. Provider   diphenhydrAMINE (BENADRYL ALLERGY) 25 MG tablet Take 1 tablet (25 mg total) by mouth every 6 (six) hours for 3 days. 12 tablet Lynden Oxford Scales, PA-C      PDMP not reviewed this encounter.  Pending results:  Labs Reviewed - No data to display  Discharge Instructions:   Discharge Instructions      I provided you with some patient education regarding bee stings that I hope you find helpful.  I provided you with Benadryl 25 mg during your visit today.  I have sent a prescription for Benadryl to your pharmacy.  Please continue taking 1 tablet every 6 hours for the next 2 to 3 days to ensure that you do not develop an acute allergic reaction.  Thank you for visiting urgent care today.      Disposition Upon Discharge:  Condition: stable for discharge home  Patient presented with an acute illness with associated systemic symptoms and significant discomfort requiring urgent management. In my opinion, this is a condition that a prudent lay person (someone who possesses an average knowledge of health and medicine) may potentially expect to result in complications if not addressed urgently such as respiratory distress, impairment of bodily function or dysfunction of bodily organs.   Routine symptom specific, illness specific and/or disease specific instructions were discussed with the patient and/or caregiver at length.   As such, the patient has been evaluated and assessed, work-up was performed and  treatment was provided in alignment with urgent care protocols and evidence based medicine.  Patient/parent/caregiver has been advised that the patient may require follow up for further testing and treatment if the symptoms continue in spite of treatment, as clinically indicated and appropriate.  Patient/parent/caregiver has been advised to return to the [facility] or PCP if no better; to PCP or the Emergency Department if new signs and symptoms develop, or if the current signs or symptoms continue to change or worsen for further workup, evaluation and treatment as clinically indicated and appropriate  The patient will follow up with their current PCP if and as advised. If the patient does not currently have a PCP we will assist them in obtaining one.  The patient may need specialty follow up if the symptoms continue, in spite of conservative treatment and management, for further workup, evaluation, consultation and treatment as clinically indicated and appropriate.   Patient/parent/caregiver verbalized understanding and agreement of plan as discussed.  All questions were addressed during visit.  Please see discharge instructions below for further details of plan.  This office note has been dictated using Museum/gallery curator.  Unfortunately, this method of dictation can sometimes lead to typographical or grammatical errors.  I apologize for your inconvenience in advance if this occurs.  Please do not hesitate to reach out to me if clarification is needed.      Lynden Oxford Scales, PA-C 05/30/22 1749

## 2022-06-06 ENCOUNTER — Encounter: Payer: Self-pay | Admitting: Internal Medicine

## 2022-06-06 LAB — NMR, LIPOPROFILE
Cholesterol, Total: 116 mg/dL (ref 100–199)
HDL Particle Number: 26.3 umol/L — ABNORMAL LOW (ref >=30.5)
HDL-C: 42 mg/dL (ref >39)
LDL Particle Number: 916 nmol/L (ref <1000)
LDL Size: 21.5 nm (ref >20.5)
LDL-C (NIH Calc): 58 mg/dL (ref 0–99)
LP-IR Score: 36 (ref <=45)
Small LDL Particle Number: 436 nmol/L (ref <=527)
Triglycerides: 81 mg/dL (ref 0–149)

## 2022-06-06 LAB — LIPOPROTEIN A (LPA): Lipoprotein (a): 245.4 nmol/L — ABNORMAL HIGH (ref <75.0)

## 2022-06-07 ENCOUNTER — Ambulatory Visit: Payer: 59 | Attending: Internal Medicine | Admitting: Internal Medicine

## 2022-06-07 ENCOUNTER — Encounter: Payer: Self-pay | Admitting: Internal Medicine

## 2022-06-07 VITALS — BP 140/70 | HR 78 | Ht 61.5 in | Wt 131.8 lb

## 2022-06-07 DIAGNOSIS — E785 Hyperlipidemia, unspecified: Secondary | ICD-10-CM | POA: Diagnosis not present

## 2022-06-07 DIAGNOSIS — I2510 Atherosclerotic heart disease of native coronary artery without angina pectoris: Secondary | ICD-10-CM | POA: Diagnosis not present

## 2022-06-07 DIAGNOSIS — Z95828 Presence of other vascular implants and grafts: Secondary | ICD-10-CM | POA: Diagnosis not present

## 2022-06-07 DIAGNOSIS — E7841 Elevated Lipoprotein(a): Secondary | ICD-10-CM

## 2022-06-07 NOTE — Progress Notes (Signed)
06/07/2022   PCP: Pcp, No  CC: Follow-up dyslipidemia  HPI:  51 year old Female hx of hypertension, dyslipidemia, chronic tobacco use, chronic kidney disease stage III admitted with hypertensive urgency 09/13/14 and 6/10 chest pain EKG showed lateral T-wave inversions on admission and cardiology was consulted. It was felt she had unstable angina and she was placed on nitro drip.  Cardiac catheterization with 2.25X 12 mm Promus remainder DES to the M RCA on 09/14/14 for 90% stenosis.  EF noted 65- 70%.  She did well and discharged without complication.    She was on numerous BP meds and her BP was running in the 90s to 106 and she felt very weak, lethargic.  The hydralazine was stopped and the cardizem.  She is feeling better.  She has occasional brief chest pressure feeling like the discomfort she was admitted with.  She is here today for follow-up. She is actually doing quite well her questions we discussed where the pneumonia vaccine she is only 63 and essentially in good health told her that was not needed unless she wanted it she does not want a flu vaccine she's had side effects in the past and refuses to take it. She is scheduled for sleep study January 29 2 [place] [facility]. She was recently seen by her primary care for cold symptoms and was placed on Zithromax.  She is feeling better.    She is asking about exercise which I recommended she could go back to to start slowly but work only a peripheral 1-2 weeks. She did not have a heart attack and her EF is normal.  Her mother was with her today who also has stents but the mother is or bare-metal stents. We discussed the difference between the 2 and the need for Plavix with both, and the length of time she would need to take the Plavix.  She is no longer smoking. She is using the NicoDerm.  I saw Anna Henson back today in the office for follow-up. She denies any chest pain or worsening shortness of breath. She seems to be stable from a  cardiac standpoint after her stent. She does however have an acute complaint. She tells me that about 2 weeks ago she had a viral upper respiratory infection and had a lot of discharge and pus. She has sore throat associated with this. Subsequently she felt a pop on the right side of her head. After that she had pain and numbness on the right side of her face on her right shoulder and down to the right upper arm and right upper back. She's also had difficulty swallowing due to pain particularly in the right side of her throat as well as some mild drooling. She denies any facial droop, right sided weakness, loss of consciousness, change in vision or other associated symptoms. She denies any headache. Blood pressure has been well controlled and is at goal today at 122/76. She was seen in urgent care and treated for acute pansinusitis by Dr. Elder Cyphers and given amoxicillin which she completed. Lab showed no elevated white count, normal renal function, her cholesterol profile was well controlled, and she had a low ESR 15  (which would likely exclude temporal arteritis).  02/23/2017  Anna Henson returns today for follow-up. Unfortunately she suffered a non-ST elevation MI in March 2018. She was found to have total occlusion of the RCA treated with drug-eluting stents. There was some mild nonobstructive disease of the LAD and circumflex and some mild LV systolic dysfunction  with EF 50-55%. She reports doing much better since discharge. She denies any recurrent chest pain but does get short of breath and some fatigue with exertion. She started cardiac rehabilitation but found that it worsened her chronic back pain and now she's discontinued that. She recently saw Ignacia Bayley, NP, in follow-up. She was felt to be doing well although reported having some tachycardia palpitations when awakening from sleep in the middle the night. He was concerned about arrhythmias and placed a monitor. Her monitor showed normal sinus rhythm  without any arrhythmias. She since discontinued that. She is on Brilinta but does not report that her shortness of breath seems to be related to the doses. I advised that she can consider some caffeine after the medication to see if it attenuates some of the symptoms.  05/16/2017  Anna Henson returns today for follow-up. Recently she's noted that her blood pressures been running much higher although was well controlled in the hospital. She denies any chest pain although has had some mild shortness of breath and occasional sharp chest discomfort. Blood pressure is notably been elevated with systolics in the 170Y and diastolics over 174B. Yesterday she was advised to take an additional lisinopril which brought her systolic blood pressure down to the 150s. Typically her blood pressures are elevated more in the evening per her report and in the morning. She was present on a diuretic was taken off of that however due to low blood pressure. Unfortunate, she was recently diagnosed with breast cancer and will need a surgery most likely in September. This will be delayed as she is on dual antiplatelet therapy given her recent stent in March 2018.   09/07/2017  Anna Henson of returns today for follow-up.  She underwent surgery for breast cancer in September.  She was off of her Effient for that procedure and then restarted it.  Since then she has been undergoing radiation therapy.  She seems to be doing fairly well with this.  She denies any recurrent chest pain or worsening shortness of breath.  Lipid profile in September 2018 showed total cholesterol 120, triglycerides 70, HDL 37 and LDL 69.  12/21/2017  Anna Henson returns today for follow-up of her coronary disease.  She underwent PCI in March 2018.  Is now been a year since then.  She is on dual antiplatelet therapy with aspirin and Effient.  She reports ongoing chest wall pain which is worse with breathing.  The pain is described as sharp and occurs on a daily basis.   Is not necessarily worsened.  This is different pain than she had with her most recent PCI.  There is concern this may be related to chest wall radiation.  09/09/2020  Anna Henson returns today for follow-up.  Recently she was seen by Almyra Deforest, PA-C who had made some adjustments to her medications.  She had surgery and he had switched her to metoprolol, stopping her hydralazine and her amlodipine.  She seems to be tolerating this well however blood pressure is not optimally controlled.  She is notices more recently been around 140/70.  Currently she is on amlodipine 10 mg, lisinopril 20 mg twice daily and metoprolol.  She has not been on any diuretic.  11/11/2020  Anna Henson returns today for follow-up.  Fortunately she was just hospitalized.  She was noted to have chest pain and elevated blood pressure over 449 systolic.  Her medications were adjusted and she was started on low-dose Aldactone.  An echo was performed which  showed normal systolic function and grade 2 diastolic dysfunction.  Subsequently she reports her chest pain is improved and her blood pressure is now more reasonably controlled today at 140/76.  Renal artery Dopplers were performed which demonstrated no evidence of right renal artery stenosis but greater than 60% left renal artery stenosis.  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  10/07/2021  Anna Henson is seen today in follow-up.  She has been following with Dr. Gwenlyn Found has been working with her regarding PAD.  She has had significant improvements in that.  Unfortunately her cholesterol remains high.  Her last assessment was in January this year which showed a total cholesterol of 226, LDL 161, HDL 29 and triglycerides 181.  Her target LDL is less than 70.  She reports compliance with atorvastatin 80 mg daily.  This is suggestive of a familial hyperlipidemia.  It would explain her more aggressive coronary disease and PAD given her young age.  She will likely need additional  therapy, probably a PCSK9 inhibitor.  01/26/2022  Anna Henson is seen today in follow-up.  Cholesterol remains high.  A repeat lipid shows some mild improvement but total cholesterol now 187, triglycerides 123, HDL 41 and LDL 124.  She remains well above target LDL less than 70, on high intensity atorvastatin 80 mg daily.  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  She will need additional therapy, likely a PCSK9 inhibitor.  I would recommend Repatha.  06/07/2022  Anna Henson returns today for follow-up.  She has done well on Repatha.  She denies any significant side effects.  Her cholesterol is accordingly come down significantly.  At her LDL particle numbers now 916, LDL-C of 58, HDL 42 and triglycerides 81.  Overall significant reduction in her lipids and now at target.  Unfortunately, she does have an elevated LP(a) of 245.  This is likely about 20 or 30% lower than it would have been without PCSK9 inhibitor.  This would suggest why she has such aggressive CAD and PAD.  She has had prior heart attack and stenting and based on this could qualify for the ocean a trial.  Current Outpatient Medications  Medication Sig Dispense Refill   amLODipine (NORVASC) 10 MG tablet Take 1 tablet (10 mg total) by mouth daily. 90 tablet 0   anastrozole (ARIMIDEX) 1 MG tablet Take 1 tablet (1 mg total) by mouth daily. 90 tablet 3   aspirin 81 MG chewable tablet Chew 1 tablet (81 mg total) by mouth daily.     atorvastatin (LIPITOR) 80 MG tablet TAKE 1 TABLET BY MOUTH EVERY DAY 90 tablet 1   baclofen (LIORESAL) 10 MG tablet One po qid (Patient taking differently: Take 10 mg by mouth 4 (four) times daily as needed for muscle spasms.) 120 tablet 11   clopidogrel (PLAVIX) 75 MG tablet Take 1 tablet (75 mg total) by mouth daily with breakfast. 90 tablet 1   diphenhydrAMINE (BENADRYL ALLERGY) 25 MG tablet Take 1 tablet (25 mg total) by mouth every  6 (six) hours for 3 days. 12 tablet 0   Evolocumab (REPATHA SURECLICK) 765 MG/ML SOAJ Inject 1 Dose into the skin every 14 (fourteen) days. 6 mL 3   fluticasone (FLONASE) 50 MCG/ACT nasal spray Place 1-2 sprays into both nostrils daily as needed for allergies or rhinitis.     furosemide (LASIX) 20 MG tablet TAKE 1 TABLET EVERY DAY FOR FLUID RETENTION  30 tablet 2   gabapentin (NEURONTIN) 300 MG capsule Take 3 capsules (900 mg total) by mouth 3 (three) times daily. 270 capsule 3   Isopropyl Alcohol (SWIMMERS EAR DROPS OT) Place 2 drops into both ears daily as needed (water in ears).     lisinopril (ZESTRIL) 40 MG tablet Take 1 tablet (40 mg total) by mouth daily. 90 tablet 3   methocarbamol (ROBAXIN) 500 MG tablet Take 1 tablet (500 mg total) by mouth 3 (three) times daily as needed for muscle spasms. 90 tablet 5   nitroGLYCERIN (NITROSTAT) 0.4 MG SL tablet PLACE 1 TABLET UNDER THE TONGUE EVERY 5 MINUTES AS NEEDED FOR CHEST PAIN 25 tablet 4   Oxcarbazepine (TRILEPTAL) 300 MG tablet TAKE 1 TABLET EVERY MORNING, 1 TABLET EVERY EVENING, AND 2 TABLET EVERY NIGHT AT BEDTIME. 120 tablet 3   Probiotic Product (PROBIOTIC-10 PO) Take 1 capsule by mouth daily.     spironolactone (ALDACTONE) 50 MG tablet Take 1 tablet each morning and 1/2 tablet each evening. 135 tablet 1   traMADol (ULTRAM) 50 MG tablet Take 1 tablet (50 mg total) by mouth 2 (two) times daily as needed. 60 tablet 1   venlafaxine XR (EFFEXOR-XR) 75 MG 24 hr capsule TAKE 1 CAPSULE BY MOUTH DAILY WITH BREAKFAST 90 capsule 1   No current facility-administered medications for this visit.    Past Medical History:  Diagnosis Date   Anemia    Anxiety    Bipolar disorder in full remission ([place])    CAD in native artery cardiologist--  dr Damien Batty   a. 09/2014 Cath/PCI in setting of [place] - s/p 2.25 x 12 mm Promus Premier DES to mid RCA;  b. 11/2016 Myoview: EF 56%, no ischemia;  c. 12/2016 NSTEMI/PCI: LM nl, LAd 25p, LCX 30p, RCA 100p (2.75x38 Promus  Premier DES), mRCA 10 ISR, EF 50-55%.   CKD (chronic kidney disease), stage III ([place])    Depression    Genetic testing 04/23/2017   Ms. Knoche underwent genetic counseling and testing for hereditary cancer syndromes on 04/05/2017. Her results were negative for mutations in all 46 genes analyzed by [facility]'s 46-gene Common Hereditary Cancers Panel. Genes analyzed include: APC, ATM, AXIN2, BARD1, BMPR1A, BRCA1, BRCA2, BRIP1, CDH1, CDKN2A, CHEK2, CTNNA1, DICER1, EPCAM, GREM1, HOXB13, KIT, MEN1, MLH1, MSH2, MSH3, MSH6, MUTYH, NBN,   GERD (gastroesophageal reflux disease)    Headache    History of external beam radiation therapy    right breast 07-27-2017  to 09-25-2017   History of non-ST elevation myocardial infarction (NSTEMI)    HOCM (hypertrophic obstructive cardiomyopathy) ([place]) followed by cardiology   a. 12/2016 Echo: EF 65-70%,  mod conc LVH, dynamic obstruction @ rest, peak velocity of 291 cm/sec w/ peak gradient of 15mHg, no rwma, Gr1 DD, triv TR, PASP 120mg.   Hyperlipidemia    Hypertension    Malignant neoplasm of lower-outer quadrant of right breast of female, estrogen receptor positive [facility] dr gu[facility]indi Adie dx 06/ 2018--- right breast invasive lobular carcinoma, ductal carcinoma w/ LCIS---- 06-21-2017 s/p right breast lumpecotmy w/ sln disseciton;   completed radiation 09-25-2017;  on tamoxifen   [facility]   S/P drug eluting coronary stent placement    09-14-2014---  PCI with DES x1 to midRCA;  12-31-2016  PCI with DES x1 to proxRCA   [facility] dr sa[facility]lecia Shelling 01/ 2017  dx transverse myelitis w/ right side numbness (09-01-2019  currently lower extremity weakness, mucsle  spasms, and gait disturbance)   Wears glasses     Past Surgical History:  Procedure Laterality Date   ABDOMINAL AORTOGRAM W/LOWER EXTREMITY N/A 08/11/2021   Procedure: ABDOMINAL AORTOGRAM W/LOWER EXTREMITY;  Surgeon: Lorretta Harp, MD;  Location: [place] CV LAB;  Service: Cardiovascular;  Laterality: N/A;   BREAST LUMPECTOMY WITH RADIOACTIVE SEED AND SENTINEL LYMPH NODE BIOPSY Right 06/21/2017   Procedure: RIGHT BREAST BRACKETED SEED GUIDED LUMPECTOMY AND SENTINEL LYMPH NODE BIOPSY;  Surgeon: Jovita Kussmaul, MD;  Location: [place];  Service: General;  Laterality: Right;   CORONARY/GRAFT ACUTE MI REVASCULARIZATION N/A 12/31/2016   Procedure: Coronary/Graft Acute MI Revascularization;  Surgeon: Sherren Mocha, MD;  Location: [place] CV LAB;  Service: Cardiovascular;  Laterality: N/A;   DILATATION & CURETTAGE/HYSTEROSCOPY WITH MYOSURE N/A 09/02/2019   Procedure: DILATATION & CURETTAGE/HYSTEROSCOPY WITH MYOSURE;  Surgeon: Aloha Gell, MD;  Location: [place];  Service: Gynecology;  Laterality: N/A;   HERNIA REPAIR     LEFT HEART CATH AND CORONARY ANGIOGRAPHY N/A 12/31/2016   Procedure: Left Heart Cath and Coronary Angiography;  Surgeon: Sherren Mocha, MD;  Location: [place] CV LAB;  Service: Cardiovascular;  Laterality: N/A;   LEFT HEART CATHETERIZATION WITH CORONARY ANGIOGRAM N/A 09/14/2014   Procedure: LEFT HEART CATHETERIZATION WITH CORONARY ANGIOGRAM;  Surgeon: Burnell Blanks, MD;  Location[facility] C[facility];  Service: Cardiovascular;  Laterality: N/A;   MASS EXCISION N/A 03/30/2020   Procedure: EXCISION MASS RIGHT LABIA MINORA;  Surgeon: Lafonda Mosses, MD;  Location: [facility];  Service: Gynecology;  Laterality: N/A;   PERCUTANEOUS CORONARY STENT INTERVENTION (PCI-S)  09/14/2014   Procedure: PERCUTANEOUS CORONARY STENT INTERVENTION (PCI-S);  Surgeon: Burnell Blanks, MD;  [facility] C[facility] LAB;  Service: Cardiovascular;;   PERIPHERAL VASCULAR INTERVENTION  08/11/2021   Procedure: PERIPHERAL VASCULAR INTERVENTION;  Surgeon: Lorretta Harp, MD;  Location: [place] CV LAB;  Service: Cardiovascular;;   ROBOTIC ASSISTED SALPINGO OOPHERECTOMY Bilateral 03/30/2020   Procedure: XI ROBOTIC ASSISTED SALPINGO  OOPHORECTOMY;  Surgeon: Lafonda Mosses, MD;  Location: [facility];  Service: Gynecology;  Laterality: Bilateral;   TONSILLECTOMY  0175   UMBILICAL HERNIA REPAIR  2001; 2005    ROS: Pertinent items noted in HPI and remainder of comprehensive ROS otherwise negative.  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  She is very high risk and would benefit in my opinion with enrolling in the ocean a trial for LP(a) lowering.  I discussed that briefly today we will have our research coordinator reach out to her.  She is in agreement with participation if she qualifies.  Plan then otherwise  follow-up with me annually or sooner as necessary.  Pixie Casino, MD, [facility], [place] Director of the Advanced Lipid Disorders &  Cardiovascular Risk Reduction Clinic Attending Cardiologist  Direct Dial: [phone]  Fax: [phone]  Website:  [email or web address]

## 2022-06-07 NOTE — Patient Instructions (Signed)
Medication Instructions:  NO CHANGES  *If you need a refill on your cardiac medications before your next appointment, please call your pharmacy*   Lab Work: FASTING lab work before next visit - due in 1 year   If you have labs (blood work) drawn today and your tests are completely normal, you will receive your results only by: [place] (if you have MyChart) OR A paper copy in the mail If you have any lab test that is abnormal or we need to change your treatment, we will call you to review the results.  Follow-Up: At [facility], you and your health needs are our priority.  As part of our continuing mission to provide you with exceptional heart care, we have created designated Provider Care Teams.  These Care Teams include your primary Cardiologist (physician) and Advanced Practice Providers (APPs -  Physician Assistants and Nurse Practitioners) who all work together to provide you with the care you need, when you need it.  We recommend signing up for the patient portal called "MyChart".  Sign up information is provided on this After Visit Summary.  MyChart is used to connect with patients for Virtual Visits (Telemedicine).  Patients are able to view lab/test results, encounter notes, upcoming appointments, etc.  Non-urgent messages can be sent to your provider as well.   To learn more about what you can do with MyChart, go to [email or web address].    Your next appointment:   12 month(s)  The format for your next appointment:   In Person  Provider:   Pixie Casino, MD

## 2022-06-16 ENCOUNTER — Ambulatory Visit (HOSPITAL_COMMUNITY)
Admission: RE | Admit: 2022-06-16 | Discharge: 2022-06-16 | Disposition: A | Discharge status: Home / Self Care | Payer: 59 | Source: Ambulatory Visit | Attending: Cardiovascular Disease | Admitting: Cardiovascular Disease

## 2022-06-16 DIAGNOSIS — I701 Atherosclerosis of renal artery: Secondary | ICD-10-CM | POA: Insufficient documentation

## 2022-07-09 ENCOUNTER — Encounter: Payer: Self-pay | Admitting: Neurology

## 2022-07-11 ENCOUNTER — Other Ambulatory Visit: Payer: Self-pay | Admitting: *Deleted

## 2022-07-11 DIAGNOSIS — M4802 Spinal stenosis, cervical region: Secondary | ICD-10-CM

## 2022-07-11 DIAGNOSIS — G373 Acute transverse myelitis in demyelinating disease of central nervous system: Secondary | ICD-10-CM

## 2022-07-11 DIAGNOSIS — R269 Unspecified abnormalities of gait and mobility: Secondary | ICD-10-CM

## 2022-07-19 ENCOUNTER — Telehealth: Payer: Self-pay

## 2022-07-19 NOTE — Telephone Encounter (Signed)
   Pre-operative Risk Assessment    Patient Name: Anna Henson  DOB: 1970/10/16 MRN: [id number]      Request for Surgical Clearance    Procedure:   C5-6, C6-7 Anterior Cervical Fusion  Date of Surgery:  Clearance TBD                                 Surgeon:  Dr Elaina Hoops Surgeon's Group or Practice Name:  [facility] Phone number:  [phone] Fax number:  [phone]. What type of clearance is requested?  Medical or Cardiac Clearance only?  Pharmacy Clearance Only (Request is to hold medication only)?  Or Both?  Press F2 and select the clearance requested.  If both are needed, select both from the drop down list.     :1}  Type of Clearance Requested:   - Medical    Type of Anesthesia:  General    Additional requests/questions:  Please advise surgeon/provider what medications should be held.  Signed, Jeanmarie Plant Jodi Criscuolo  CCMA 07/19/2022, 11:45 AM

## 2022-07-19 NOTE — Telephone Encounter (Signed)
   Patient Name: Anna Henson  DOB: 1971-01-30 MRN: [id number]  Primary Cardiologist: Pixie Casino, MD  Chart reviewed as part of pre-operative protocol coverage. Given past medical history and time since last visit, based on ACC/AHA guidelines, and in discussion with Dr. Debara Pickett, Anna Henson is at acceptable risk for the planned procedure without further cardiovascular testing.   Regarding antiplatelet therarpy.  She may hold Aspirin for 7 days prior to surgery and Clopidogrel for 7 days prior to surgery with a plan to resume post-operatively when considered safe by the neurosurgical team.  I will route this recommendation to the requesting party via [place] fax function and remove from pre-op pool.  Please call with questions.  Murray Hodgkins, NP 07/19/2022, 12:50 PM

## 2022-07-19 NOTE — Telephone Encounter (Signed)
Ok to hold ASA 7 days and Plavix 5 days prior to surgery - restart when considered safe by neurosurgery after the procedure.  Dr. Debara Pickett

## 2022-07-26 ENCOUNTER — Telehealth: Payer: Self-pay | Admitting: Neurology

## 2022-07-26 NOTE — Telephone Encounter (Signed)
MRI of the cervical spine 07/06/2022 was reviewed.  It shows mild spinal stenosis at C3-C4, C4-C5 and C6-C7 and moderate spinal stenosis at C5-C6.  Additionally, at C5-C6 there is moderately severe bilateral foraminal narrowing.  At C6-C7 there is moderately severe left and moderate right foraminal narrowing.  MRI of the lumbar spine 07/06/2022 was reviewed.  It shows degenerative changes at L5-S1 with disc protrusion causing right greater than left lateral recess stenosis.  There does not appear to be nerve root compression.  No spinal stenosis.  I reviewed the neurosurgical note from 07/18/2022.  She was felt to have significant findings causing C6 and C7 radiculopathy.  ACDF at C5-C6 and C6-C7 was discussed.  She agreed to proceed.

## 2022-07-28 DIAGNOSIS — Z006 Encounter for examination for normal comparison and control in clinical research program: Secondary | ICD-10-CM

## 2022-07-28 NOTE — Research (Signed)
Called and spoke to patient about the Ocean(a) trial and she is interested but at work and requested more information sent to her.

## 2022-08-07 ENCOUNTER — Other Ambulatory Visit: Payer: Self-pay | Admitting: *Deleted

## 2022-08-07 ENCOUNTER — Ambulatory Visit: Payer: 59 | Admitting: Cardiovascular Disease

## 2022-08-07 DIAGNOSIS — G373 Acute transverse myelitis in demyelinating disease of central nervous system: Secondary | ICD-10-CM

## 2022-08-07 DIAGNOSIS — R269 Unspecified abnormalities of gait and mobility: Secondary | ICD-10-CM

## 2022-08-13 ENCOUNTER — Other Ambulatory Visit: Payer: Self-pay | Admitting: Neurology

## 2022-08-15 ENCOUNTER — Encounter: Payer: Self-pay | Admitting: Cardiovascular Disease

## 2022-08-15 ENCOUNTER — Ambulatory Visit: Payer: 59 | Attending: Cardiovascular Disease | Admitting: Cardiovascular Disease

## 2022-08-15 VITALS — BP 142/80 | HR 65 | Ht 61.5 in | Wt 130.6 lb

## 2022-08-15 DIAGNOSIS — I739 Peripheral vascular disease, unspecified: Secondary | ICD-10-CM

## 2022-08-15 DIAGNOSIS — N2889 Other specified disorders of kidney and ureter: Secondary | ICD-10-CM | POA: Diagnosis not present

## 2022-08-15 NOTE — Assessment & Plan Note (Signed)
Anna Henson  has a history of PAD status post peripheral angiography by myself 08/11/2021.  I stented her left extrailiac artery with a 9 mm x 60 mm long absolute Pro nitinol self-expanding stent.  She also had a 99% mid left SFA stenosis with three-vessel runoff.  Her claudication resolved and her Dopplers improved.  Her most recent Doppler studies performed 02/07/2022 revealed her left iliac stent to be widely patent with a left ABI of 1.06.

## 2022-08-15 NOTE — Assessment & Plan Note (Signed)
Patient was recently sent to me by Dr. Debara Pickett for renal vascular hypertension.  Her renal Dopplers were abnormal.  I did angiogram her 08/11/2021 that showed only a 40% proximal left renal artery stenosis.  Blood pressures under control.  Her most recent renal Doppler studies performed 06/16/2022 revealed a left renal aortic ratio of 4.56.  We will continue to follow her noninvasively.

## 2022-08-15 NOTE — Patient Instructions (Signed)
Medication Instructions:  Your physician recommends that you continue on your current medications as directed. Please refer to the Current Medication list given to you today.  *If you need a refill on your cardiac medications before your next appointment, please call your pharmacy*   Testing/Procedures: Your physician has requested that you have an Aorta/Iliac Duplex. This will be take place at [place], [address].  No food after 11PM the night before.  Water is OK. (Don't drink liquids if you have been instructed not to for ANOTHER test) Avoid foods that produce bowel gas, for 24 hours prior to exam (see below). No breakfast, no chewing gum, no smoking or carbonated beverages. Patient may take morning medications with water. Come in for test at least 15 minutes early to register. To be done June/July 2024.  Your physician has requested that you have an ankle brachial index (ABI). During this test an ultrasound and blood pressure cuff are used to evaluate the arteries that supply the arms and legs with blood. Allow thirty minutes for this exam. There are no restrictions or special instructions. To be done June/July 2024. This will take place at [place], [address].   Your physician has requested that you have a renal artery duplex. During this test, an ultrasound is used to evaluate blood flow to the kidneys. Take your medications as you usually do. To be done June/July 2024. This will take place at [place], [address].  No food after 11PM the night before.  Water is OK. (Don't drink liquids if you have been instructed not to for ANOTHER test). Avoid foods that produce bowel gas, for 24 hours prior to exam (see below). No breakfast, no chewing gum, no smoking or carbonated beverages. Patient may take morning medications with water. Come in for test at least 15 minutes early to register.    Follow-Up: At [facility], you and your health needs are our  priority.  As part of our continuing mission to provide you with exceptional heart care, we have created designated Provider Care Teams.  These Care Teams include your primary Cardiologist (physician) and Advanced Practice Providers (APPs -  Physician Assistants and Nurse Practitioners) who all work together to provide you with the care you need, when you need it.  We recommend signing up for the patient portal called "MyChart".  Sign up information is provided on this After Visit Summary.  MyChart is used to connect with patients for Virtual Visits (Telemedicine).  Patients are able to view lab/test results, encounter notes, upcoming appointments, etc.  Non-urgent messages can be sent to your provider as well.   To learn more about what you can do with MyChart, go to [email or web address].    Your next appointment:   12 month(s)  The format for your next appointment:   In Person  Provider:   Quay Burow, MD

## 2022-08-15 NOTE — Progress Notes (Signed)
08/15/2022 [name]   23-Aug-1971  [id number]  Primary Physician Pcp, No Primary Cardiologist: Lorretta Harp MD Garret Reddish, [place], [place]  HPI:  Anna Henson is a 51 y.o.  mild to moderately overweight single African-American female with no children referred to me by Dr. Debara Pickett for evaluation of potential renal vascular hypertension.  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  I last saw her in the office 08/26/2021.Marland Kitchen  Her cardiac risk factors are notable for discontinue tobacco abuse having smoked approximately 12 pack years and discontinued in 2015.  She has treated hypertension and hyperlipidemia.  Her mother had myocardial infarction and stents.  She has had a heart attack in the past and has had RCA stenting 09/14/2014 and again 12/31/2016.  She has had hypertension for many years and has had several admissions for hypertensive urgency.  She had renal Doppler studies performed in our office 10/15/2020 that suggested mild to moderate left renal artery stenosis with a renal aortic ratio of 3.58 and subsequent CTA performed 11/19/2020 that showed moderate left renal artery stenosis in the 50% range which was confirmatory.  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  I performed peripheral angiography on her 08/11/2021 revealing a high-grade distal left common iliac artery stenosis which I stented.  She had a 90% focal mid left SFA which I did not intervene on with three-vessel runoff.  There was no obstructive disease on the right.  She did have a 40% proximal left renal artery stenosis.  Her claudication is significantly improved since her procedure.  Since I saw her a year ago she continues to do well.  She denies chest pain or shortness of breath or claudication.  Her Doppler studies performed 02/07/2022 revealed patent left iliac stent and her renal Doppler studies performed 06/16/2022 showed a left renal aortic ratio  4.56.   Current Meds  Medication Sig   amLODipine (NORVASC) 10 MG tablet Take 1 tablet (10 mg total) by mouth daily.   anastrozole (ARIMIDEX) 1 MG tablet Take 1 tablet (1 mg total) by mouth daily.   aspirin 81 MG chewable tablet Chew 1 tablet (81 mg total) by mouth daily.   atorvastatin (LIPITOR) 80 MG tablet TAKE 1 TABLET BY MOUTH EVERY DAY   baclofen (LIORESAL) 10 MG tablet One po qid (Patient taking differently: Take 10 mg by mouth 4 (four) times daily as needed for muscle spasms.)   clopidogrel (PLAVIX) 75 MG tablet Take 1 tablet (75 mg total) by mouth daily with breakfast.   diphenhydrAMINE (BENADRYL ALLERGY) 25 MG tablet Take 1 tablet (25 mg total) by mouth every 6 (six) hours for 3 days.   Evolocumab (REPATHA SURECLICK) 941 MG/ML SOAJ Inject 1 Dose into the skin every 14 (fourteen) days.   fluticasone (FLONASE) 50 MCG/ACT nasal spray Place 1-2 sprays into both nostrils daily as needed for allergies or rhinitis.   furosemide (LASIX) 20 MG tablet TAKE 1 TABLET EVERY DAY FOR FLUID RETENTION   gabapentin (NEURONTIN) 300 MG capsule Take 3 capsules (900 mg total) by mouth 3 (three) times daily.   Isopropyl Alcohol (SWIMMERS EAR DROPS OT) Place 2 drops into both ears daily as needed (water in ears).   lisinopril (ZESTRIL) 40 MG tablet Take 1 tablet (40 mg total) by mouth daily.   methocarbamol (ROBAXIN) 500 MG tablet Take 1 tablet (500 mg total) by mouth 3 (three) times daily as needed for muscle spasms.  nitroGLYCERIN (NITROSTAT) 0.4 MG SL tablet PLACE 1 TABLET UNDER THE TONGUE EVERY 5 MINUTES AS NEEDED FOR CHEST PAIN   Oxcarbazepine (TRILEPTAL) 300 MG tablet TAKE 1 TABLET EVERY MORNING, 1 TABLET EVERY EVENING, AND 2 TABLET EVERY NIGHT AT BEDTIME.   Probiotic Product (PROBIOTIC-10 PO) Take 1 capsule by mouth daily.   spironolactone (ALDACTONE) 50 MG tablet Take 1 tablet each morning and 1/2 tablet each evening.   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   Drug use: No   Sexual activity: Not Currently  Other Topics Concern   Not on file  Social History Narrative   Consumes 2 cups of caffeine daily   Social Determinants of Health   Financial Resource Strain: Medium Risk (08/16/2017)   Overall Financial Resource Strain (CARDIA)    Difficulty of Paying Living Expenses: Somewhat hard  Food Insecurity: Food Insecurity Present (08/16/2017)   Hunger Vital Sign    Worried About Running Out of Food in the Last Year: Often true    Ran Out of Food in the Last Year: Often true  Transportation Needs: No Transportation Needs (08/16/2017)   PRAPARE - Hydrologist (Medical): No    Lack of Transportation (Non-Medical): No  Physical Activity: Insufficiently Active (08/16/2017)   Exercise Vital Sign    Days of Exercise per Week: 1 day    Minutes of Exercise per Session: 10 min  Stress: Stress Concern Present (08/16/2017)   [place]    Feeling of Stress : Very much  Social Connections: Somewhat Isolated (08/16/2017)   Social Connection and Isolation  Panel [NHANES]    Frequency of Communication with Friends and Family: More than three times a week    Frequency of Social Gatherings with Friends and Family: Once a week    Attends Religious Services: 1 to 4 times per year    Active Member of [employer] or Organizations: No    Attends Archivist Meetings: Never    Marital Status: Never married  Intimate Partner Violence: Not At Risk (08/16/2017)   Humiliation, Afraid, Rape, and Kick questionnaire    Fear of Current or Ex-Partner: No    Emotionally Abused: No    Physically Abused: No    Sexually Abused: No     Review of Systems: General: negative for chills, fever, night sweats or weight changes.  Cardiovascular: negative for chest pain,  dyspnea on exertion, edema, orthopnea, palpitations, paroxysmal nocturnal dyspnea or shortness of breath Dermatological: negative for rash Respiratory: negative for cough or wheezing Urologic: negative for hematuria Abdominal: negative for nausea, vomiting, diarrhea, bright red blood per rectum, melena, or hematemesis Neurologic: negative for visual changes, syncope, or dizziness All other systems reviewed and are otherwise negative except as noted above.    Blood pressure (!) 142/80, pulse 65, height 5' 1.5" (1.562 m), weight 130 lb 9.6 oz (59.2 kg), last menstrual period 03/06/2020, SpO2 98 %.  General appearance: alert and no distress Neck: no adenopathy, no carotid bruit, no JVD, supple, symmetrical, trachea midline, and thyroid not enlarged, symmetric, no tenderness/mass/nodules Lungs: clear to auscultation bilaterally Heart: regular rate and rhythm, S1, S2 normal, no murmur, click, rub or gallop Extremities: extremities normal, atraumatic, no cyanosis or edema Pulses: 2+ and symmetric Skin: Skin color, texture, turgor normal. No rashes or lesions Neurologic: Grossly normal  EKG sinus rhythm at 65 without ST or T wave changes.  Personally reviewed this EKG.  ASSESSMENT AND PLAN:    Peripheral arterial disease [facility]) Mr. Garverick  has a history of PAD status post peripheral angiography by myself 08/11/2021.  I stented her left extrailiac artery with a 9 mm x 60 mm long absolute Pro nitinol self-expanding stent.  She also had a 99% mid left SFA stenosis with three-vessel runoff.  Her claudication resolved and her Dopplers improved.  Her most recent Doppler studies performed 02/07/2022 revealed her left iliac stent to be widely patent with a left ABI of 1.06.  Renal vascular disease Patient was recently sent to me by Dr. Debara Pickett for renal vascular hypertension.  Her renal Dopplers were abnormal.  I did angiogram her 08/11/2021 that showed only a 40% proximal left renal artery stenosis.  Blood pressures under control.  Her most recent renal Doppler studies performed 06/16/2022 revealed a left renal aortic ratio of 4.56.  We will continue to follow her noninvasively.     Lorretta Harp MD FACP,FACC,FAHA, [facility] 08/15/2022 12:09 PM

## 2022-08-18 ENCOUNTER — Other Ambulatory Visit: Payer: Self-pay | Admitting: Neurology

## 2022-08-22 NOTE — Addendum Note (Signed)
Addended by: Gean Birchwood on: 08/22/2022 09:31 AM   Modules accepted: Orders

## 2022-09-13 ENCOUNTER — Encounter: Payer: Self-pay | Admitting: Neurology

## 2022-09-13 ENCOUNTER — Other Ambulatory Visit: Payer: Self-pay | Admitting: Neurology

## 2022-09-13 ENCOUNTER — Other Ambulatory Visit: Payer: Self-pay | Admitting: *Deleted

## 2022-09-13 MED ORDER — GABAPENTIN 300 MG PO CAPS: 900.0000 mg | ORAL_CAPSULE | Freq: Three times a day (TID) | ORAL | 5 refills | Status: DC

## 2022-09-22 ENCOUNTER — Encounter: Payer: Self-pay | Admitting: Neurology

## 2022-09-26 NOTE — Telephone Encounter (Signed)
Called number provided by pt. Spoke w/ Elpidio Galea. Showing walker shipped and delivered on 07/13/22.Advised this was an incorrect walker. Pt wanted Rolator walker. This order placed 08/07/22 and got confirmation back from [employer] that they received. She will have intake team reach out to pt. Aware they can view order from 08/07/22 in epic under other orders.

## 2022-09-28 ENCOUNTER — Other Ambulatory Visit: Payer: Self-pay | Admitting: Neurology

## 2022-09-28 ENCOUNTER — Encounter: Payer: Self-pay | Admitting: *Deleted

## 2022-09-28 ENCOUNTER — Encounter: Payer: Self-pay | Admitting: Neurology

## 2022-10-01 ENCOUNTER — Other Ambulatory Visit: Payer: Self-pay | Admitting: Cardiovascular Disease

## 2022-10-01 DIAGNOSIS — E785 Hyperlipidemia, unspecified: Secondary | ICD-10-CM

## 2022-10-03 ENCOUNTER — Other Ambulatory Visit: Payer: Self-pay | Admitting: Neurology

## 2022-10-03 ENCOUNTER — Other Ambulatory Visit: Payer: Self-pay | Admitting: Internal Medicine

## 2022-11-08 ENCOUNTER — Encounter: Payer: Self-pay | Admitting: Neurology

## 2022-11-08 ENCOUNTER — Ambulatory Visit: Payer: 59 | Admitting: Neurology

## 2022-11-08 VITALS — BP 146/97 | HR 78 | Ht 62.0 in | Wt 126.5 lb

## 2022-11-08 DIAGNOSIS — R269 Unspecified abnormalities of gait and mobility: Secondary | ICD-10-CM | POA: Diagnosis not present

## 2022-11-08 DIAGNOSIS — G373 Acute transverse myelitis in demyelinating disease of central nervous system: Secondary | ICD-10-CM | POA: Diagnosis not present

## 2022-11-08 DIAGNOSIS — Z79899 Other long term (current) drug therapy: Secondary | ICD-10-CM

## 2022-11-08 DIAGNOSIS — R208 Other disturbances of skin sensation: Secondary | ICD-10-CM

## 2022-11-08 DIAGNOSIS — M4802 Spinal stenosis, cervical region: Secondary | ICD-10-CM

## 2022-11-08 DIAGNOSIS — M5412 Radiculopathy, cervical region: Secondary | ICD-10-CM

## 2022-11-08 MED ORDER — OXCARBAZEPINE 600 MG PO TABS: ORAL_TABLET | ORAL | 11 refills | Status: DC

## 2022-11-08 MED ORDER — TRAMADOL HCL 50 MG PO TABS: ORAL_TABLET | ORAL | 5 refills | Status: DC

## 2022-11-08 NOTE — Progress Notes (Signed)
[facility]  PATIENT: Anna Henson DOB: May 05, 1971  REFERRING DOCTOR OR PCP:  Rikki Spearing _________________________________   HISTORICAL  CHIEF COMPLAINT:  Chief Complaint  Patient presents with   Room 10    Pt is here with her Brother. Pt states that things have been the same. Pt states that she is in pain. Pt states that she had trouble getting dressed this morning due to the pain.     HISTORY OF PRESENT ILLNESS:  Semaya Vida is a 52 yo woman with a transverse myelitis in late January 2017.    Follow-Up 11/08/2022 She gets pain dysesthesias in her neck and arm that fluctuate.     These are electric like or burning sensations radiating down her spine and into the ribs when more severe. Right side is worse than left.    Medications have not helped..  Pain is worse when the neck is touched.  Pain is sometimes better with a heating pad.   She notes numbness is worse n her hands, left > right.  MRI 12/2019 showed resolution of the cervical spine lesion seen previously (we discussed that despite no lesion now on MRI a scar could still be left behind).    She takes gabapentin 900 mg po tid , oxcarbazapine 300-300-600 mg , Tramadol 50 mg po bid, baclofen 10 mg 3-4 times a day.  These medications help tingling pain some. She feels worse than last year.     Lamotrigne had not helped  She also had right > left arm pain.  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  Also has spinal stenosis at [facility].     She had an ESI (Dr. Chrystine Oiler).   The procedure was very painful and she did not note a benefit.   She prefers not to do surgery.   She saw Dr. Saintclair Halsted 07/2022 who felt she could be a candidate for C5-C7 ACDF but that it was not absolutely necessary.  She is concerned about the risks and that it may no help many of her symptoms.    She has reduced gait due to right foot drop as sequela as transverse myelitis in the past  She uses the walker sometimes in  the  homes but needs it outdoors.  Generally she can go 10-20 feet without her walker and 100 feet (but usually with a rest) with her walker. She does much worse when fatigued when even one room away is difficult with her walker.  IMAGING December 24, 2019 MRI of the cervical spine is normal (MRI February 2017 has shown T2 hyperintensity from the medulla to the mid cervical spine) she does.  She does have degenerative changes.  There is moderate spinal stenosis at C5-C6 with moderate right foraminal narrowing.  At C6-C7, she has left-sided disc osteophyte complex that could affect the left C7 nerve root.  December 24, 2019 MRI of the thoracic spine showed a normal spinal cord and no significant degenerative changes.  Transverse myelitis history: Transverse myelitis history:    In late January 2017,  she had a right sided headache and went to bed.  When she woke up, she noted numbness in the right neck, shoulder and arm.   Numbness worsened over the next fe days.   She went to her PCP an was told she might have sinusitis and Amoxicillin was prescribed.   After a couple more days, she saw another doctor and had a neck soft  tissue CT scan.   That CT scan showed that she had an increase of adenoid tissue and reactive type lymph nodes.  By that time, numbness extended to the fingertips on the riht but not into her leg.   She was told to go to the emergency room for further evaluation. In the emergency room she had an MRI of the cervical spine and the brain. The MRI of the cervical spine showed a focus that extended from the lower medulla on the right down to C5 posteriorly and more medially within the spinal cord. She also had some enhancement around the level of the cervical medullary junction. She was admitted to the hospital for further treatment and testing.    She was treated with 5 days of IV Solu-Medrol. During that time, her symptoms of numbness improved.  She has not returned to baseline.    She had many tests  performed including lumbar puncture for CSF analysis and a thoracic spine MRI and serology blood tests. For the most part, the evaluation was negative.  The 02/02/16 cervical spine shows resolution of the prior enhancing focus that was seen at the cervicomedullary junction.    Data:     MRI  January 2017 has a longitudinal lesion extending from the lower medulla on the right down to C5/C6 on  The enhancement does not extend up to the medulla.  Cervical spine also showed mild spinal stenosis at C3-C4 and mild to moderate spinal stenosis at C5-C6 and mild spinal stenosis at C6-C7. There is some foraminal narrowing at those levels but no definite nerve root compression.  Elsewhere the brain, there were some subcortical and deep white matter T2/FLAIR hyperintense foci but not in a pattern that would be typical for MS. The thoracic spine was normal.   The 02/02/16 cervical spine MRI shows resolution of the prior enhancing focus that was seen at the cervicomedullary/upper cervical spine  .    December 24, 2019 MRI of the cervical spine is normal (MRI February 2017 has shown T2 hyperintensity from the medulla to the mid cervical spine) she does.  She does have degenerative changes.  There is moderate spinal stenosis at C5-C6 with moderate right foraminal narrowing.  At C6-C7, she has left-sided disc osteophyte complex that could affect the left C7 nerve root.  December 24, 2019 MRI of the thoracic spine showed a normal spinal cord and no significant degenerative changes.  2017 laboratory tests n. Neuromyelitis optica antibodies are negative. She did not have oligoclonal bands. ANCA, ANA, SSA/SSB, ACE and Vit B-12 were all normal or negative.  She had Epstein-Barr IgG but not IgM. CSF cell counts did not show increase in white blood cells.  REVIEW OF SYSTEMS: Constitutional: No fevers, chills, sweats, or change in appetite.  She notes some fatigue Eyes: No visual changes, double vision, eye pain Ear, nose and throat:  No hearing loss, ear pain, nasal congestion, sore throat Cardiovascular: No chest pain, palpitations.   H/O angina, CAD with stent Respiratory:  No shortness of breath at rest or with exertion.   No wheezes GastrointestinaI: No nausea, vomiting, diarrhea, abdominal pain, fecal incontinence Genitourinary:  Mild urgency and frequency.  No nocturia. Musculoskeletal:  No neck pain, back pain Integumentary: No rash, pruritus, skin lesions Neurological: as above Psychiatric: No depression at this time.  No anxiety Endocrine: No palpitations, diaphoresis, change in appetite, change in weigh or increased thirst Hematologic/Lymphatic:  No anemia, purpura, petechiae. Allergic/Immunologic: No itchy/runny eyes, nasal congestion, recent allergic reactions,  rashes  ALLERGIES: No Active Allergies   HOME MEDICATIONS:  Current Outpatient Medications:    amLODipine (NORVASC) 10 MG tablet, TAKE 1 TABLET BY MOUTH EVERY DAY, Disp: 90 tablet, Rfl: 3   anastrozole (ARIMIDEX) 1 MG tablet, Take 1 tablet (1 mg total) by mouth daily., Disp: 90 tablet, Rfl: 3   aspirin 81 MG chewable tablet, Chew 1 tablet (81 mg total) by mouth daily., Disp: , Rfl:    atorvastatin (LIPITOR) 80 MG tablet, TAKE 1 TABLET BY MOUTH EVERY DAY, Disp: 90 tablet, Rfl: 3   baclofen (LIORESAL) 10 MG tablet, TAKE 1 TABLET BY MOUTH 4 TIMES DAILY, Disp: 120 tablet, Rfl: 11   clopidogrel (PLAVIX) 75 MG tablet, Take 1 tablet (75 mg total) by mouth daily with breakfast., Disp: 90 tablet, Rfl: 1   Evolocumab (REPATHA SURECLICK) 016 MG/ML SOAJ, Inject 1 Dose into the skin every 14 (fourteen) days., Disp: 6 mL, Rfl: 3   fluticasone (FLONASE) 50 MCG/ACT nasal spray, Place 1-2 sprays into both nostrils daily as needed for allergies or rhinitis., Disp: , Rfl:    furosemide (LASIX) 20 MG tablet, TAKE 1 TABLET EVERY DAY FOR FLUID RETENTION, Disp: 30 tablet, Rfl: 2   gabapentin (NEURONTIN) 300 MG capsule, Take 3 capsules (900 mg total) by mouth 3 (three)  times daily., Disp: 270 capsule, Rfl: 5   Isopropyl Alcohol (SWIMMERS EAR DROPS OT), Place 2 drops into both ears daily as needed (water in ears)., Disp: , Rfl:    lisinopril (ZESTRIL) 40 MG tablet, TAKE 1 TABLET BY MOUTH EVERY DAY, Disp: 90 tablet, Rfl: 3   methocarbamol (ROBAXIN) 500 MG tablet, TAKE 1 TABLET(500 MG) BY MOUTH THREE TIMES DAILY AS NEEDED FOR MUSCLE SPASMS, Disp: 90 tablet, Rfl: 0   nitroGLYCERIN (NITROSTAT) 0.4 MG SL tablet, PLACE 1 TABLET UNDER THE TONGUE EVERY 5 MINUTES AS NEEDED FOR CHEST PAIN, Disp: 25 tablet, Rfl: 4   Probiotic Product (PROBIOTIC-10 PO), Take 1 capsule by mouth daily., Disp: , Rfl:    spironolactone (ALDACTONE) 50 MG tablet, Take 1 tablet each morning and 1/2 tablet each evening., Disp: 135 tablet, Rfl: 1   venlafaxine XR (EFFEXOR-XR) 75 MG 24 hr capsule, TAKE 1 CAPSULE BY MOUTH DAILY WITH BREAKFAST, Disp: 90 capsule, Rfl: 1   diphenhydrAMINE (BENADRYL ALLERGY) 25 MG tablet, Take 1 tablet (25 mg total) by mouth every 6 (six) hours for 3 days., Disp: 12 tablet, Rfl: 0   Oxcarbazepine (TRILEPTAL) 600 MG tablet, TAKE 1 TABLET THREE TIMES A DAY, Disp: 90 tablet, Rfl: 11   traMADol (ULTRAM) 50 MG tablet, TAKE 1 TABLET(50 MG) BY MOUTH TWICE DAILY AS NEEDED, Disp: 60 tablet, Rfl: 5  PAST MEDICAL HISTORY: Past Medical History:  Diagnosis Date   Anemia    Anxiety    Bipolar disorder in full remission ([place])    CAD in native artery cardiologist--  dr hilty   a. 09/2014 Cath/PCI in setting of [place] - s/p 2.25 x 12 mm Promus Premier DES to mid RCA;  b. 11/2016 Myoview: EF 56%, no ischemia;  c. 12/2016 NSTEMI/PCI: LM nl, LAd 25p, LCX 30p, RCA 100p (2.75x38 Promus Premier DES), mRCA 10 ISR, EF 50-55%.   CKD (chronic kidney disease), stage III ([place])    Depression    Genetic testing 04/23/2017   Ms. Neeb underwent genetic counseling and testing for hereditary cancer syndromes on 04/05/2017. Her results were negative for mutations in all 46 genes analyzed by [facility]'s 46-gene  Common Hereditary Cancers Panel. Genes analyzed include: APC,  ATM, AXIN2, BARD1, BMPR1A, BRCA1, BRCA2, BRIP1, CDH1, CDKN2A, CHEK2, CTNNA1, DICER1, EPCAM, GREM1, HOXB13, KIT, MEN1, MLH1, MSH2, MSH3, MSH6, MUTYH, NBN,   GERD (gastroesophageal reflux disease)    Headache    History of external beam radiation therapy    right breast 07-27-2017  to 09-25-2017   History of non-ST elevation myocardial infarction (NSTEMI)    HOCM (hypertrophic obstructive cardiomyopathy) ([place]) followed by cardiology   a. 12/2016 Echo: EF 65-70%,  mod conc LVH, dynamic obstruction @ rest, peak velocity of 291 cm/sec w/ peak gradient of 57mHg, no rwma, Gr1 DD, triv TR, PASP 160mg.   Hyperlipidemia    Hypertension    Malignant neoplasm of lower-outer quadrant of right breast of female, estrogen receptor positive [facility] dr guLindi Adie dx 06/ 2018--- right breast invasive lobular carcinoma, ductal carcinoma w/ LCIS---- 06-21-2017 s/p right breast lumpecotmy w/ sln disseciton;   completed radiation 09-25-2017;  on tamoxifen   [facility]   S/P drug eluting coronary stent placement    09-14-2014---  PCI with DES x1 to midRCA;  12-31-2016  PCI with DES x1 to proxRCA   [facility] dr sa[facility]lecia Shelling 01/ 2017  dx transverse myelitis w/ right side numbness (09-01-2019  currently lower extremity weakness, mucsle spasms, and gait disturbance)   Wears glasses     PAST SURGICAL HISTORY: Past Surgical History:  Procedure Laterality Date   ABDOMINAL AORTOGRAM W/LOWER EXTREMITY N/A 08/11/2021   Procedure: ABDOMINAL AORTOGRAM W/LOWER EXTREMITY;  Surgeon: BeLorretta HarpMD;  [facility];  Service: Cardiovascular;  Laterality: N/A;   BREAST LUMPECTOMY WITH RADIOACTIVE SEED AND SENTINEL LYMPH NODE BIOPSY Right 06/21/2017   Procedure: RIGHT BREAST BRACKETED SEED GUIDED LUMPECTOMY AND SENTINEL LYMPH NODE BIOPSY;  Surgeon: ToJovita KussmaulMD;  [facility]  Service: General;  Laterality: Right;   CORONARY/GRAFT ACUTE MI REVASCULARIZATION N/A 12/31/2016   Procedure: Coronary/Graft Acute MI Revascularization;  Surgeon: MiSherren MochaMD;  [facility];  Service: Cardiovascular;  Laterality: N/A;   DILATATION & CURETTAGE/HYSTEROSCOPY WITH MYOSURE N/A 09/02/2019   Procedure: DILATATION & CURETTAGE/HYSTEROSCOPY WITH MYOSURE;  Surgeon: FoAloha GellMD;  [place] [facility] Service: Gynecology;  Laterality: N/A;   HERNIA REPAIR     LEFT HEART CATH AND CORONARY ANGIOGRAPHY N/A 12/31/2016   Procedure: Left Heart Cath and Coronary Angiography;  Surgeon: MiSherren MochaMD;  [facility];  Service: Cardiovascular;  Laterality: N/A;   LEFT HEART CATHETERIZATION WITH CORONARY ANGIOGRAM N/A 09/14/2014   Procedure: LEFT HEART CATHETERIZATION WITH CORONARY ANGIOGRAM;  Surgeon: ChBurnell BlanksMD;  [facility];  Service: Cardiovascular;  Laterality: N/A;   MASS EXCISION N/A 03/30/2020   Procedure: EXCISION MASS RIGHT LABIA MINORA;  Surgeon: TuLafonda MossesMD;  Location: [facility];  Service: Gynecology;  Laterality: N/A;   PERCUTANEOUS CORONARY STENT INTERVENTION (PCI-S)  09/14/2014   Procedure: PERCUTANEOUS CORONARY STENT INTERVENTION (PCI-S);  Surgeon: ChBurnell BlanksMD;  [facility];  Service: Cardiovascular;;   PERIPHERAL VASCULAR INTERVENTION  08/11/2021   Procedure: PERIPHERAL VASCULAR INTERVENTION;  Surgeon: BeLorretta HarpMD;  [facility];  Service: Cardiovascular;;   ROBOTIC ASSISTED SALPINGO OOPHERECTOMY Bilateral 03/30/2020   Procedure: XI ROBOTIC ASSISTED SALPINGO OOPHORECTOMY;  Surgeon: TuLafonda MossesMD;  Location: [facility];  Service: Gynecology;  Laterality: Bilateral;   TONSILLECTOMY  [id number] UMBILICAL HERNIA REPAIR  2001; 2005    FAMILY HISTORY: Family History  Problem Relation Age of Onset   Diabetes Mother    Heart disease Mother     Hyperlipidemia Mother    Hypertension Mother    Breast cancer Mother    Lung cancer Father    Drug abuse Father    Brain cancer Father 6   Bone cancer Father 18   Drug abuse Sister    Mental illness Brother    Mental illness Maternal Grandmother    Stomach cancer Paternal Grandmother 57       d.75      DIAGNOSTIC DATA (LABS, IMAGING, TESTING) - I reviewed patient records, labs, notes, testing and imaging myself where available.  Lab Results  Component Value Date   WBC 8.3 08/12/2021   HGB 11.7 (L) 08/12/2021   HCT 33.7 (L) 08/12/2021   MCV 90.3 08/12/2021   PLT 341 08/12/2021   +__________________________________________________________   Today's Vitals   11/08/22 0836  BP: (!) 146/97  Pulse: 78  Weight: 126 lb 8 oz (57.4 kg)  Height: '5\' 2"'$  (1.575 m)   Body mass index is 23.14 kg/m.  General: The patient is well-developed and well-nourished and in no acute distress   Neurologic Exam   Mental status: The patient is alert and oriented x 3 at the time of the examination. The patient has apparent normal recent and remote memory, with an apparently normal attention span and concentration ability.   Speech is normal.   Cranial nerves: Extraocular movements are full.   Facial strength and sensation is normal. Trapezius strength is normal    No obvious hearing deficits are noted.   Motor:  Muscle bulk is normal.  Tone is increased in legs, right greater than left. Strength is 5/5 except 4+/5 right triceps.   Sensory: Reduced right C6 and C7 sensation in hand.  Vibration are symmetric in arms but reduced left leg vibration sensation vs right  Touch was symmetric...      Gait and station: Station is normal.   Gait is spastic (right) and wide. Right foot drop, worse after 2-3 steps.  Cannot tandem.    Romberg negative.     Reflexes: Deep tendon reflexes are symmetric in arms and increased in legs, right greater than left. There is spread at the knees.   One beat right   ankle clonus .   ________________________________________________________   Transverse myelitis ([place]) - Plan: Comprehensive metabolic panel, CBC with Differential/Platelet  High risk medication use - Plan: Comprehensive metabolic panel, CBC with Differential/Platelet  Dysesthesia  Gait disturbance  Cervical spinal stenosis  Cervical radiculopathy   1. Increase oxcarbazepine to 1800/day in split dose.  Continue gabapentin, baclofen and tramadol.     If higher dose not hekpful, consider vimpat instead of gabapentin  2.  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   If new symptoms in different neurologic region, consider MRI brain to assess for MS.   3.  Stay active and exercise as tolerated 4.  SHe prefers not to proceed with neurosurgery for her moderate spinal stenosis and cervical radiculopathy.  We discussed that we could always reconsider referral back to neurosurgery if symptoms worsen.  5.   rtc 6 months, sooner if new or worsening issues     Essa Wenk A. Felecia Shelling, MD, PhD 2/67/1245, 8:09 AM Certified in Neurology, Clinical Neurophysiology, Sleep Medicine, Pain Medicine and Neuroimaging  [facility] [address], [place],  [place] [address] [phone]

## 2022-11-09 LAB — CBC WITH DIFFERENTIAL/PLATELET
Basophils Absolute: 0 10*3/uL (ref 0.0–0.2)
Basos: 0 %
EOS (ABSOLUTE): 0.3 10*3/uL (ref 0.0–0.4)
Eos: 3 %
Hematocrit: 40 % (ref 34.0–46.6)
Hemoglobin: 13.8 g/dL (ref 11.1–15.9)
Immature Grans (Abs): 0 10*3/uL (ref 0.0–0.1)
Immature Granulocytes: 0 %
Lymphocytes Absolute: 3.8 10*3/uL — ABNORMAL HIGH (ref 0.7–3.1)
Lymphs: 37 %
MCH: 31.4 pg (ref 26.6–33.0)
MCHC: 34.5 g/dL (ref 31.5–35.7)
MCV: 91 fL (ref 79–97)
Monocytes Absolute: 0.5 10*3/uL (ref 0.1–0.9)
Monocytes: 5 %
Neutrophils Absolute: 5.6 10*3/uL (ref 1.4–7.0)
Neutrophils: 55 %
Platelets: 355 10*3/uL (ref 150–450)
RBC: 4.4 x10E6/uL (ref 3.77–5.28)
RDW: 14.8 % (ref 11.7–15.4)
WBC: 10.3 10*3/uL (ref 3.4–10.8)

## 2022-11-09 LAB — COMPREHENSIVE METABOLIC PANEL
ALT: 25 IU/L (ref 0–32)
AST: 25 IU/L (ref 0–40)
Albumin/Globulin Ratio: 1.9 (ref 1.2–2.2)
Albumin: 4.4 g/dL (ref 3.9–4.9)
Alkaline Phosphatase: 119 IU/L (ref 44–121)
BUN/Creatinine Ratio: 13 (ref 9–23)
BUN: 12 mg/dL (ref 6–24)
Bilirubin Total: <0.2 mg/dL (ref 0.0–1.2)
CO2: 23 mmol/L (ref 20–29)
Calcium: 10 mg/dL (ref 8.7–10.2)
Chloride: 105 mmol/L (ref 96–106)
Creatinine, Ser: 0.94 mg/dL (ref 0.57–1.00)
Globulin, Total: 2.3 g/dL (ref 1.5–4.5)
Glucose: 88 mg/dL (ref 70–99)
Potassium: 4.2 mmol/L (ref 3.5–5.2)
Sodium: 143 mmol/L (ref 134–144)
Total Protein: 6.7 g/dL (ref 6.0–8.5)
eGFR: 74 mL/min/{1.73_m2} (ref >59)

## 2022-11-22 ENCOUNTER — Telehealth: Payer: Self-pay | Admitting: Hematology and Oncology

## 2022-11-22 NOTE — Telephone Encounter (Signed)
Rescheduled appointment per provider PAL. Patient is aware of the changes made to her upcoming appointment. 

## 2022-11-26 ENCOUNTER — Other Ambulatory Visit: Payer: Self-pay | Admitting: Hematology and Oncology

## 2022-11-29 NOTE — Progress Notes (Incomplete)
Patient Care Team: Pcp, No as PCP - General Hilty, Nadean Corwin, MD as PCP - Cardiology (Cardiology) Sater, Nanine Means, MD as Consulting Physician (Neurology) Jovita Kussmaul, MD as Consulting Physician (General Surgery) Nicholas Lose, MD as Consulting Physician (Hematology and Oncology) Kyung Rudd, MD as Consulting Physician (Radiation Oncology)  DIAGNOSIS: No diagnosis found.  SUMMARY OF ONCOLOGIC HISTORY: Oncology History  Malignant neoplasm of lower-outer quadrant of right breast of female, estrogen receptor positive ([place])  03/26/2017 Initial Diagnosis   Palpable right breast mass with a normal mammogram: 8:00 position by ultrasound 3.2 cm irregular mass: Grade 2 invasive lobular cancer ER 85%, PR 100%, Ki-67 2%, HER-2 negative ratio 1.5, T2 N0 stage II a clinical stage   04/20/2017 Genetic Testing   Genetic counseling and testing for hereditary cancer syndromes performed on . Results are negative for pathogenic mutations in 46 genes analyzed by [facility]'s Common Hereditary Cancers Panel. Results are dated . Genes tested: APC, ATM, AXIN2, BARD1, BMPR1A, BRCA1, BRCA2, BRIP1, CDH1, CDKN2A, CHEK2, CTNNA1, DICER1, EPCAM, GREM1, HOXB13, KIT, MEN1, MLH1, MSH2, MSH3, MSH6, MUTYH, NBN, NF1, NTHL1, PALB2, PDGFRA, PMS2, POLD1, POLE, PTEN, RAD50, RAD51C, RAD51D, SDHA, SDHB, SDHC, SDHD, SMAD4, SMARCA4, STK11, TP53, TSC1, TSC2, and VHL.  Variants of uncertain significance (not clinically actionable) were noted in APC and BRIP1.      06/21/2017 Surgery   Right lumpectomy: Mixed invasive lobular and ductal carcinoma grade 2-3, 5 cm, LCIS, 0/5 lymph nodes negative, ER 85%, PR 100%, Ki-67 2%, HER-2 negative ratio 1.5, T2 N0 stage II a AJCC 8   08/22/2017 Oncotype testing   Oncotype Dx 16: 10% ROR   08/29/2017 - 09/25/2017 Radiation Therapy   Adj XRT   10/10/2017 -  Anti-estrogen oral therapy   Tamoxifen 20 mg daily x10 years   04/07/2020 Surgery   BSO     CHIEF COMPLIANT: Follow-up of right  breast cancer on anastrozole   INTERVAL HISTORY: Anna Henson is a 52 y.o. with above-mentioned history of right breast cancer treated with lumpectomy, radiation, and who is currently on anastrozole. She presents to the clinic today for follow-up.    ALLERGIES:  has no active allergies.  MEDICATIONS:  Current Outpatient Medications  Medication Sig Dispense Refill   amLODipine (NORVASC) 10 MG tablet TAKE 1 TABLET BY MOUTH EVERY DAY 90 tablet 3   anastrozole (ARIMIDEX) 1 MG tablet Take 1 tablet (1 mg total) by mouth daily. 90 tablet 3   aspirin 81 MG chewable tablet Chew 1 tablet (81 mg total) by mouth daily.     atorvastatin (LIPITOR) 80 MG tablet TAKE 1 TABLET BY MOUTH EVERY DAY 90 tablet 3   baclofen (LIORESAL) 10 MG tablet TAKE 1 TABLET BY MOUTH 4 TIMES DAILY 120 tablet 11   clopidogrel (PLAVIX) 75 MG tablet Take 1 tablet (75 mg total) by mouth daily with breakfast. 90 tablet 1   diphenhydrAMINE (BENADRYL ALLERGY) 25 MG tablet Take 1 tablet (25 mg total) by mouth every 6 (six) hours for 3 days. 12 tablet 0   Evolocumab (REPATHA SURECLICK) XX123456 MG/ML SOAJ Inject 1 Dose into the skin every 14 (fourteen) days. 6 mL 3   fluticasone (FLONASE) 50 MCG/ACT nasal spray Place 1-2 sprays into both nostrils daily as needed for allergies or rhinitis.     furosemide (LASIX) 20 MG tablet TAKE 1 TABLET EVERY DAY FOR FLUID RETENTION 30 tablet 2   gabapentin (NEURONTIN) 300 MG capsule Take 3 capsules (900 mg total) by mouth 3 (three) times daily.  270 capsule 5   Isopropyl Alcohol (SWIMMERS EAR DROPS OT) Place 2 drops into both ears daily as needed (water in ears).     lisinopril (ZESTRIL) 40 MG tablet TAKE 1 TABLET BY MOUTH EVERY DAY 90 tablet 3   methocarbamol (ROBAXIN) 500 MG tablet TAKE 1 TABLET(500 MG) BY MOUTH THREE TIMES DAILY AS NEEDED FOR MUSCLE SPASMS 90 tablet 0   nitroGLYCERIN (NITROSTAT) 0.4 MG SL tablet PLACE 1 TABLET UNDER THE TONGUE EVERY 5 MINUTES AS NEEDED FOR CHEST PAIN 25 tablet 4    Oxcarbazepine (TRILEPTAL) 600 MG tablet TAKE 1 TABLET THREE TIMES A DAY 90 tablet 11   Probiotic Product (PROBIOTIC-10 PO) Take 1 capsule by mouth daily.     spironolactone (ALDACTONE) 50 MG tablet Take 1 tablet each morning and 1/2 tablet each evening. 135 tablet 1   traMADol (ULTRAM) 50 MG tablet TAKE 1 TABLET(50 MG) BY MOUTH TWICE DAILY AS NEEDED 60 tablet 5   venlafaxine XR (EFFEXOR-XR) 75 MG 24 hr capsule TAKE 1 CAPSULE BY MOUTH DAILY WITH BREAKFAST 90 capsule 1   No current facility-administered medications for this visit.    PHYSICAL EXAMINATION: ECOG PERFORMANCE STATUS: {CHL ONC ECOG PS:[phone]}  There were no vitals filed for this visit. There were no vitals filed for this visit.  BREAST:*** No palpable masses or nodules in either right or left breasts. No palpable axillary supraclavicular or infraclavicular adenopathy no breast tenderness or nipple discharge. (exam performed in the presence of a chaperone)  LABORATORY DATA:  I have reviewed the data as listed    Latest Ref Rng & Units 11/08/2022    9:18 AM 08/12/2021    4:07 AM 08/05/2021    4:05 PM  CMP  Glucose 70 - 99 mg/dL 88  80  69   BUN 6 - 24 mg/dL '12  12  10   '$ Creatinine 0.57 - 1.00 mg/dL 0.94  0.99  0.90   Sodium 134 - 144 mmol/L 143  134  135   Potassium 3.5 - 5.2 mmol/L 4.2  4.9  4.8   Chloride 96 - 106 mmol/L 105  105  98   CO2 20 - 29 mmol/L '23  19  22   '$ Calcium 8.7 - 10.2 mg/dL 10.0  9.5  10.1   Total Protein 6.0 - 8.5 g/dL 6.7     Total Bilirubin 0.0 - 1.2 mg/dL <0.2     Alkaline Phos 44 - 121 IU/L 119     AST 0 - 40 IU/L 25     ALT 0 - 32 IU/L 25       Lab Results  Component Value Date   WBC 10.3 11/08/2022   HGB 13.8 11/08/2022   HCT 40.0 11/08/2022   MCV 91 11/08/2022   PLT 355 11/08/2022   NEUTROABS 5.6 11/08/2022    ASSESSMENT & PLAN:  No problem-specific Assessment & Plan notes found for this encounter.    No orders of the defined types were placed in this encounter.  The  patient has a good understanding of the overall plan. she agrees with it. she will call with any problems that may develop before the next visit here. Total time spent: 30 mins including face to face time and time spent for planning, charting and co-ordination of care   Suzzette Righter, [place] 11/29/22    I Gardiner Coins am acting as a Education administrator for [employer]  ***

## 2022-11-30 ENCOUNTER — Encounter: Payer: Self-pay | Admitting: Neurology

## 2022-11-30 ENCOUNTER — Other Ambulatory Visit: Payer: Self-pay | Admitting: Neurology

## 2022-11-30 ENCOUNTER — Ambulatory Visit: Payer: 59 | Admitting: Hematology and Oncology

## 2022-11-30 MED ORDER — METHOCARBAMOL 500 MG PO TABS
ORAL_TABLET | ORAL | 0 refills | Status: DC
Start: 2022-11-30 — End: 2023-01-16

## 2022-11-30 NOTE — Telephone Encounter (Signed)
Pt called wanting to know the update on this refill. Please advise.

## 2022-11-30 NOTE — Telephone Encounter (Signed)
Last 11/08/22 Follow up scheduled on 05/09/23

## 2022-12-04 ENCOUNTER — Inpatient Hospital Stay: Payer: 59 | Attending: Hematology and Oncology | Admitting: Hematology and Oncology

## 2022-12-04 NOTE — Assessment & Plan Note (Deleted)
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   Bilateral breast tenderness: ultrasound in both breasts.  05/06/2019: Benign Breast cancer surveillance: 1.  Breast exam 12/04/2022: Benign 2.  Mammogram: 04/07/2022: [facility]: Benign, breast density category B CT thoracic spine 08/20/2021: Negative for any thoracic spine fracture or lesions   Return to clinic  in 1 year for follow-up

## 2022-12-20 ENCOUNTER — Telehealth: Payer: Self-pay | Admitting: *Deleted

## 2022-12-20 NOTE — Telephone Encounter (Signed)
Received VM from pt requesting to reschedule a missed appt from 12/04/22.  Message sent to scheduling team.

## 2022-12-21 ENCOUNTER — Other Ambulatory Visit: Payer: Self-pay | Admitting: Hematology and Oncology

## 2022-12-22 ENCOUNTER — Telehealth: Payer: Self-pay | Admitting: Hematology and Oncology

## 2022-12-22 ENCOUNTER — Ambulatory Visit: Payer: 59 | Admitting: Podiatry

## 2022-12-22 DIAGNOSIS — I739 Peripheral vascular disease, unspecified: Secondary | ICD-10-CM

## 2022-12-22 DIAGNOSIS — M2042 Other hammer toe(s) (acquired), left foot: Secondary | ICD-10-CM

## 2022-12-22 NOTE — Telephone Encounter (Signed)
Per 3/15 IB reached out to patient to schedule, patient aware of date and time of appointment.

## 2022-12-22 NOTE — Progress Notes (Signed)
Chief Complaint  Patient presents with   Callouses    Left foot 2nd toe callus, started 2 years ago, patient has pain while walking, rate of pain 7 out of 10, sharp,     HPI: 52 y.o. female presenting today as a new patient for evaluation of pain and tenderness associated to the plantar aspect of the left second toe that has been ongoing for over 20 years now.  Patient states that it is very painful and symptomatic.  She has seen other physicians in the past to simply debride the area.  She has never had x-rays performed.  She has tried different shoe gear modifications over the past several years but nothing seems to alleviate her pain.  She is looking for more definitive solution to her pain.  Past Medical History:  Diagnosis Date   Anemia    Anxiety    Bipolar disorder in full remission ([place])    CAD in native artery cardiologist--  dr hilty   a. 09/2014 Cath/PCI in setting of [place] - s/p 2.25 x 12 mm Promus Premier DES to mid RCA;  b. 11/2016 Myoview: EF 56%, no ischemia;  c. 12/2016 NSTEMI/PCI: LM nl, LAd 25p, LCX 30p, RCA 100p (2.75x38 Promus Premier DES), mRCA 10 ISR, EF 50-55%.   CKD (chronic kidney disease), stage III ([place])    Depression    Genetic testing 04/23/2017   Ms. Peloquin underwent genetic counseling and testing for hereditary cancer syndromes on 04/05/2017. Her results were negative for mutations in all 46 genes analyzed by [facility]'s 46-gene Common Hereditary Cancers Panel. Genes analyzed include: APC, ATM, AXIN2, BARD1, BMPR1A, BRCA1, BRCA2, BRIP1, CDH1, CDKN2A, CHEK2, CTNNA1, DICER1, EPCAM, GREM1, HOXB13, KIT, MEN1, MLH1, MSH2, MSH3, MSH6, MUTYH, NBN,   GERD (gastroesophageal reflux disease)    Headache    History of external beam radiation therapy    right breast 07-27-2017  to 09-25-2017   History of non-ST elevation myocardial infarction (NSTEMI)    HOCM (hypertrophic obstructive cardiomyopathy) ([place]) followed by cardiology   a. 12/2016 Echo: EF 65-70%,  mod conc LVH,  dynamic obstruction @ rest, peak velocity of 291 cm/sec w/ peak gradient of 46mmHg, no rwma, Gr1 DD, triv TR, PASP 63mmHg.   Hyperlipidemia    Hypertension    Malignant neoplasm of lower-outer quadrant of right breast of female, estrogen receptor positive [facility]) oncologist--- dr Lindi Adie   dx 06/ 2018--- right breast invasive lobular carcinoma, ductal carcinoma w/ LCIS---- 06-21-2017 s/p right breast lumpecotmy w/ sln disseciton;   completed radiation 09-25-2017;  on tamoxifen   Myocardial infarction [facility])    2017   S/P drug eluting coronary stent placement    09-14-2014---  PCI with DES x1 to midRCA;  12-31-2016  PCI with DES x1 to proxRCA   Transverse myelitis [facility]) neurologist--- dr Felecia Shelling   01/ 2017  dx transverse myelitis w/ right side numbness (09-01-2019  currently lower extremity weakness, mucsle spasms, and gait disturbance)   Wears glasses     Past Surgical History:  Procedure Laterality Date   ABDOMINAL AORTOGRAM W/LOWER EXTREMITY N/A 08/11/2021   Procedure: ABDOMINAL AORTOGRAM W/LOWER EXTREMITY;  Surgeon: Lorretta Harp, MD;  Location: [place] CV LAB;  Service: Cardiovascular;  Laterality: N/A;   BREAST LUMPECTOMY WITH RADIOACTIVE SEED AND SENTINEL LYMPH NODE BIOPSY Right 06/21/2017   Procedure: RIGHT BREAST BRACKETED SEED GUIDED LUMPECTOMY AND SENTINEL LYMPH NODE BIOPSY;  Surgeon: Jovita Kussmaul, MD;  Location: [place];  Service: General;  Laterality: Right;  CORONARY/GRAFT ACUTE MI REVASCULARIZATION N/A 12/31/2016   Procedure: Coronary/Graft Acute MI Revascularization;  Surgeon: Sherren Mocha, MD;  Location: [place] CV LAB;  Service: Cardiovascular;  Laterality: N/A;   DILATATION & CURETTAGE/HYSTEROSCOPY WITH MYOSURE N/A 09/02/2019   Procedure: DILATATION & CURETTAGE/HYSTEROSCOPY WITH MYOSURE;  Surgeon: Aloha Gell, MD;  Location: [place];  Service: Gynecology;  Laterality: N/A;   HERNIA REPAIR     LEFT HEART CATH AND CORONARY ANGIOGRAPHY N/A  12/31/2016   Procedure: Left Heart Cath and Coronary Angiography;  Surgeon: Sherren Mocha, MD;  Location: [place] CV LAB;  Service: Cardiovascular;  Laterality: N/A;   LEFT HEART CATHETERIZATION WITH CORONARY ANGIOGRAM N/A 09/14/2014   Procedure: LEFT HEART CATHETERIZATION WITH CORONARY ANGIOGRAM;  Surgeon: Burnell Blanks, MD;  Location: [facility] CATH LAB;  Service: Cardiovascular;  Laterality: N/A;   MASS EXCISION N/A 03/30/2020   Procedure: EXCISION MASS RIGHT LABIA MINORA;  Surgeon: Lafonda Mosses, MD;  Location: [facility];  Service: Gynecology;  Laterality: N/A;   PERCUTANEOUS CORONARY STENT INTERVENTION (PCI-S)  09/14/2014   Procedure: PERCUTANEOUS CORONARY STENT INTERVENTION (PCI-S);  Surgeon: Burnell Blanks, MD;  Location: [facility] CATH LAB;  Service: Cardiovascular;;   PERIPHERAL VASCULAR INTERVENTION  08/11/2021   Procedure: PERIPHERAL VASCULAR INTERVENTION;  Surgeon: Lorretta Harp, MD;  Location: [place] CV LAB;  Service: Cardiovascular;;   ROBOTIC ASSISTED SALPINGO OOPHERECTOMY Bilateral 03/30/2020   Procedure: XI ROBOTIC ASSISTED SALPINGO OOPHORECTOMY;  Surgeon: Lafonda Mosses, MD;  Location: [facility];  Service: Gynecology;  Laterality: Bilateral;   TONSILLECTOMY  [id number]   UMBILICAL HERNIA REPAIR  2001; 2005    Allergies  Allergen Reactions   Pork-Derived Products Other (See Comments)    Does NOT eat pork    LT foot 12/22/2022  Physical Exam: General: The patient is alert and oriented x3 in no acute distress.  Dermatology: Skin is warm, dry and supple bilateral lower extremities. Negative for open lesions or macerations.  Symptomatic painful callus noted to the plantar aspect of the head of the proximal phalanx left second toe.  No open wound or ulcer.  Please see above noted photo  Vascular: Known history of PAD VAS [place] ABI W/WO TBI 02/07/2022 ABI Findings:  +---------+------------------+-----+---------+--------+  Right   Rt Pressure (mmHg)IndexWaveform  Comment   +---------+------------------+-----+---------+--------+  PTA     123               1.11 triphasic          +---------+------------------+-----+---------+--------+  PERO    110               0.99 triphasic          +---------+------------------+-----+---------+--------+  DP      111               1.00 triphasic          +---------+------------------+-----+---------+--------+  Great Toe95                0.86 Normal             +---------+------------------+-----+---------+--------+   +---------+------------------+-----+---------+-------+  Left    Lt Pressure (mmHg)IndexWaveform Comment  +---------+------------------+-----+---------+-------+  Brachial 111                                      +---------+------------------+-----+---------+-------+  PTA     118  1.06 triphasic         +---------+------------------+-----+---------+-------+  PERO    110                    triphasic         +---------+------------------+-----+---------+-------+  DP      110               0.99 triphasic         +---------+------------------+-----+---------+-------+  Great Toe101               0.91 Normal            +---------+------------------+-----+---------+-------+   +-------+-----------+-----------+------------+------------+  ABI/TBIToday's ABIToday's TBIPrevious ABIPrevious TBI  +-------+-----------+-----------+------------+------------+  Right 1.11       .86        1.0         .75           +-------+-----------+-----------+------------+------------+  Left  1.06       .91        .82         .67           +-------+-----------+-----------+------------+------------+   Right ABIs and TBIs appear essentially unchanged compared to prior study  on 08/19/2021. Left ABIs and TBIs appear increased compared to prior study  on 08/19/2021.    Summary:  Right: Resting right ankle-brachial index is within normal  range. No  evidence of significant right lower extremity arterial disease. The right  toe-brachial index is normal.   Left: Resting left ankle-brachial index is within normal range. No  evidence of significant left lower extremity arterial disease. The left  toe-brachial index is normal.   Neurological: Light touch and protective threshold grossly intact  Musculoskeletal Exam: There is tenderness with palpation noted to the plantar aspect of the PIPJ second digit left  Radiographic Exam LT foot 12/22/2022:  Normal osseous mineralization. Joint spaces preserved. No fracture/dislocation/boney destruction.  There does appear to be a hypertrophic somewhat plantarflexed head of the proximal phalanx of the left second toe  Assessment: 1.  Symptomatic callus plantar aspect of the head of the proximal phalanx second digit left foot 2.  H/o PAD  Plan of Care:  1. Patient evaluated. X-Rays reviewed.  2.  Unfortunate the patient has had pain and tenderness associated to the plantar aspect of the second toe for over 20 years.  She has tried different shoe gear modifications and conservative modalities but there is nothing that seems to alleviate her pain or symptoms.  She has also seen other podiatrist who simply debrided the callus with minimal improvement or relief. 3.  I do believe that arthroplasty of the head of the proximal phalanx should help alleviate pressure from the underlying symptomatic callus.  This was explained in detail to the patient.  The procedure was explained.  Risk benefits advantages and disadvantages as well as the postoperative recovery course were explained in detail.  No guarantees were expressed or implied.  The patient would like to proceed with surgery 4.  Authorization for surgery was initiated today.  Surgery will consist of PIPJ arthroplasty second digit left foot 5.  Prior to surgery I did order updated ABIs left lower extremity.  She does have history of PAD.  Recommend  vascular clearance from Dr. Quay Burow 6.  Return to clinic 1 week postop      Edrick Kins, DPM [facility]  Dr. Edrick Kins, DPM    [address], [place] [address]                Office [phone]  Fax [phone]

## 2022-12-24 NOTE — Assessment & Plan Note (Signed)
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   Bilateral breast tenderness: ultrasound in both breasts.  05/06/2019: Benign  Breast cancer surveillance: 1.  Breast exam   12/25/22: Benign 2.  Mammogram: 04/07/22: [facility]: Benign, breast density category B CT thoracic spine 08/20/2021: Negative for any thoracic spine fracture or lesions   Return to clinic  in 1 year for follow-up

## 2022-12-25 ENCOUNTER — Other Ambulatory Visit: Payer: Self-pay

## 2022-12-25 ENCOUNTER — Inpatient Hospital Stay: Payer: 59 | Attending: Hematology and Oncology | Admitting: Hematology and Oncology

## 2022-12-25 VITALS — BP 178/84 | HR 64 | Temp 97.3°F | Resp 13 | Wt 129.1 lb

## 2022-12-25 DIAGNOSIS — C50511 Malignant neoplasm of lower-outer quadrant of right female breast: Secondary | ICD-10-CM | POA: Diagnosis not present

## 2022-12-25 DIAGNOSIS — Z170 Estrogen receptor positive status [ER+]: Secondary | ICD-10-CM | POA: Diagnosis not present

## 2022-12-25 DIAGNOSIS — Z79899 Other long term (current) drug therapy: Secondary | ICD-10-CM | POA: Insufficient documentation

## 2022-12-25 DIAGNOSIS — Z79811 Long term (current) use of aromatase inhibitors: Secondary | ICD-10-CM | POA: Diagnosis not present

## 2022-12-25 NOTE — Progress Notes (Signed)
Patient Care Team: Patient, No Pcp Per as PCP - General (General Practice) Hilty, Nadean Corwin, MD as PCP - Cardiology (Cardiology) Sater, Nanine Means, MD as Consulting Physician (Neurology) Jovita Kussmaul, MD as Consulting Physician (General Surgery) Nicholas Lose, MD as Consulting Physician (Hematology and Oncology) Kyung Rudd, MD as Consulting Physician (Radiation Oncology)  DIAGNOSIS:  Encounter Diagnosis  Name Primary?   Malignant neoplasm of lower-outer quadrant of right breast of female, estrogen receptor positive ([name]) Yes    SUMMARY OF ONCOLOGIC HISTORY: Oncology History  Malignant neoplasm of lower-outer quadrant of right breast of female, estrogen receptor positive ([name])  03/26/2017 Initial Diagnosis   Palpable right breast mass with a normal mammogram: 8:00 position by ultrasound 3.2 cm irregular mass: Grade 2 invasive lobular cancer ER 85%, PR 100%, Ki-67 2%, HER-2 negative ratio 1.5, T2 N0 stage II a clinical stage   04/20/2017 Genetic Testing   Genetic counseling and testing for hereditary cancer syndromes performed on . Results are negative for pathogenic mutations in 46 genes analyzed by [facility]'s Common Hereditary Cancers Panel. Results are dated . Genes tested: APC, ATM, AXIN2, BARD1, BMPR1A, BRCA1, BRCA2, BRIP1, CDH1, CDKN2A, CHEK2, CTNNA1, DICER1, EPCAM, GREM1, HOXB13, KIT, MEN1, MLH1, MSH2, MSH3, MSH6, MUTYH, NBN, NF1, NTHL1, PALB2, PDGFRA, PMS2, POLD1, POLE, PTEN, RAD50, RAD51C, RAD51D, SDHA, SDHB, SDHC, SDHD, SMAD4, SMARCA4, STK11, TP53, TSC1, TSC2, and VHL.  Variants of uncertain significance (not clinically actionable) were noted in APC and BRIP1.      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  She has been on antiestrogen therapy for the past 5 years.  She has lost significant amount of weight by exercising and dieting and limiting her portion sizes.   ALLERGIES:  is allergic to pork-derived products.  MEDICATIONS:  Current Outpatient Medications  Medication Sig Dispense Refill   amLODipine (NORVASC) 10 MG tablet TAKE 1 TABLET BY MOUTH EVERY DAY 90 tablet 3   anastrozole (ARIMIDEX) 1 MG tablet TAKE 1 TABLET(1 MG) BY MOUTH DAILY 90 tablet 3   aspirin 81 MG chewable tablet Chew 1 tablet (81 mg total) by mouth daily.     atorvastatin (LIPITOR) 80 MG tablet TAKE 1 TABLET BY MOUTH EVERY DAY 90 tablet 3   baclofen (LIORESAL) 10 MG tablet TAKE 1 TABLET BY MOUTH 4 TIMES DAILY 120 tablet 11   clopidogrel (PLAVIX) 75 MG tablet Take 1 tablet (75 mg total) by mouth daily with breakfast. 90 tablet 1   diphenhydrAMINE (BENADRYL ALLERGY) 25 MG tablet Take 1 tablet (25 mg total) by mouth every 6 (six) hours for 3 days. 12 tablet 0   Evolocumab (REPATHA SURECLICK) [id number] MG/ML SOAJ Inject 1 Dose into the skin every 14 (fourteen) days. 6 mL 3   fluticasone (FLONASE) 50 MCG/ACT nasal spray Place 1-2 sprays into both nostrils daily as needed for  allergies or rhinitis.     furosemide (LASIX) 20 MG tablet TAKE 1 TABLET EVERY DAY FOR FLUID RETENTION 30 tablet 2   gabapentin (NEURONTIN) 300 MG capsule Take 3 capsules (900 mg total) by mouth 3 (three) times daily. 270 capsule 5   Isopropyl Alcohol (SWIMMERS EAR DROPS OT) Place 2 drops into both ears daily as needed (water in ears).     lisinopril (ZESTRIL) 40 MG tablet TAKE 1 TABLET BY MOUTH EVERY DAY 90 tablet 3    methocarbamol (ROBAXIN) 500 MG tablet TAKE 1 TABLET(500 MG) BY MOUTH THREE TIMES DAILY AS NEEDED FOR MUSCLE SPASMS 90 tablet 0   nitroGLYCERIN (NITROSTAT) 0.4 MG SL tablet PLACE 1 TABLET UNDER THE TONGUE EVERY 5 MINUTES AS NEEDED FOR CHEST PAIN 25 tablet 4   Oxcarbazepine (TRILEPTAL) 600 MG tablet TAKE 1 TABLET THREE TIMES A DAY 90 tablet 11   Probiotic Product (PROBIOTIC-10 PO) Take 1 capsule by mouth daily.     spironolactone (ALDACTONE) 50 MG tablet Take 1 tablet each morning and 1/2 tablet each evening. 135 tablet 1   traMADol (ULTRAM) 50 MG tablet TAKE 1 TABLET(50 MG) BY MOUTH TWICE DAILY AS NEEDED 60 tablet 5   venlafaxine XR (EFFEXOR-XR) 75 MG 24 hr capsule TAKE 1 CAPSULE BY MOUTH DAILY WITH BREAKFAST 90 capsule 1   No current facility-administered medications for this visit.    PHYSICAL EXAMINATION: ECOG PERFORMANCE STATUS: 1 - Symptomatic but completely ambulatory  Vitals:   12/25/22 1152  BP: (!) 178/84  Pulse: 64  Resp: 13  Temp: (!) 97.3 F (36.3 C)  SpO2: 94%   Filed Weights   12/25/22 1152  Weight: 129 lb 1.6 oz (58.6 kg)      LABORATORY DATA:  I have reviewed the data as listed    Latest Ref Rng & Units 11/08/2022    9:18 AM 08/12/2021    4:07 AM 08/05/2021    4:05 PM  CMP  Glucose 70 - 99 mg/dL 88  80  69   BUN 6 - 24 mg/dL 12  12  10    Creatinine 0.57 - 1.00 mg/dL 0.94  0.99  0.90   Sodium 134 - 144 mmol/L 143  134  135   Potassium 3.5 - 5.2 mmol/L 4.2  4.9  4.8   Chloride 96 - 106 mmol/L 105  105  98   CO2 20 - 29 mmol/L 23  19  22    Calcium 8.7 - 10.2 mg/dL 10.0  9.5  10.1   Total Protein 6.0 - 8.5 g/dL 6.7     Total Bilirubin 0.0 - 1.2 mg/dL <0.2     Alkaline Phos 44 - 121 IU/L 119     AST 0 - 40 IU/L 25     ALT 0 - 32 IU/L 25       Lab Results  Component Value Date   WBC 10.3 11/08/2022   HGB 13.8 11/08/2022   HCT 40.0 11/08/2022   MCV 91 11/08/2022   PLT 355 11/08/2022   NEUTROABS 5.6 11/08/2022    ASSESSMENT & PLAN:  Malignant  neoplasm of lower-outer quadrant of right breast of female, estrogen receptor positive ([name]) 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   Bilateral breast tenderness: ultrasound in both breasts.  05/06/2019: Benign  Breast cancer surveillance:  Mammogram: 04/07/22: [facility]: Benign, breast density category B CT thoracic spine 08/20/2021: Negative for any thoracic spine fracture or lesions   Return to clinic  in 1 year for follow-up   No orders of the defined types were placed in this encounter.  The patient has a good understanding of the overall plan. she agrees with it. she will call with any problems that may develop before the next visit here. Total time spent: 30 mins including face to face time and time spent for planning, charting and co-ordination of care   Harriette Ohara, MD 12/25/22

## 2022-12-29 ENCOUNTER — Telehealth: Payer: Self-pay | Admitting: Cardiovascular Disease

## 2022-12-29 NOTE — Telephone Encounter (Signed)
   Pre-operative Risk Assessment    Patient Name: Anna Henson  DOB: Nov 03, 1970 MRN: [id number]      Request for Surgical Clearance    Procedure:  Hammer Toe Repair 2nd toe left foot  Date of Surgery:  Clearance 02-08-23                                 Surgeon:  Dr Macarthur Critchley Surgeon's Group or Practice Name:   Phone number:  [phone] Fax number:  [phone]   Type of Clearance Requested:  medical and medicine- please let them know if any medicine need to be held    Type of Anesthesia:  choice   Additional requests/questions:    Signed, Glyn Ade   12/29/2022, 2:04 PM

## 2023-01-01 ENCOUNTER — Telehealth: Payer: Self-pay

## 2023-01-01 NOTE — Telephone Encounter (Signed)
  Patient Consent for Virtual Visit       Anna Henson has provided verbal consent on 01/01/2023 for a virtual visit (video or telephone).   CONSENT FOR VIRTUAL VISIT FOR:  Anna Henson  By participating in this virtual visit I agree to the following:  I hereby voluntarily request, consent and authorize [facility] and its employed or contracted physicians, physician assistants, nurse practitioners or other licensed health care professionals (the Practitioner), to provide me with telemedicine health care services (the "Services") as deemed necessary by the treating Practitioner. I acknowledge and consent to receive the Services by the Practitioner via telemedicine. I understand that the telemedicine visit will involve communicating with the Practitioner through live audiovisual communication technology and the disclosure of certain medical information by electronic transmission. I acknowledge that I have been given the opportunity to request an in-person assessment or other available alternative prior to the telemedicine visit and am voluntarily participating in the telemedicine visit.  I understand that I have the right to withhold or withdraw my consent to the use of telemedicine in the course of my care at any time, without affecting my right to future care or treatment, and that the Practitioner or I may terminate the telemedicine visit at any time. I understand that I have the right to inspect all information obtained and/or recorded in the course of the telemedicine visit and may receive copies of available information for a reasonable fee.  I understand that some of the potential risks of receiving the Services via telemedicine include:  Delay or interruption in medical evaluation due to technological equipment failure or disruption; Information transmitted may not be sufficient (e.g. poor resolution of images) to allow for appropriate medical decision making by the Practitioner;  and/or  In rare instances, security protocols could fail, causing a breach of personal health information.  Furthermore, I acknowledge that it is my responsibility to provide information about my medical history, conditions and care that is complete and accurate to the best of my ability. I acknowledge that Practitioner's advice, recommendations, and/or decision may be based on factors not within their control, such as incomplete or inaccurate data provided by me or distortions of diagnostic images or specimens that may result from electronic transmissions. I understand that the practice of medicine is not an exact science and that Practitioner makes no warranties or guarantees regarding treatment outcomes. I acknowledge that a copy of this consent can be made available to me via my patient portal ([facility]), or I can request a printed copy by calling the office of [facility].    I understand that my insurance will be billed for this visit.   I have read or had this consent read to me. I understand the contents of this consent, which adequately explains the benefits and risks of the Services being provided via telemedicine.  I have been provided ample opportunity to ask questions regarding this consent and the Services and have had my questions answered to my satisfaction. I give my informed consent for the services to be provided through the use of telemedicine in my medical care

## 2023-01-01 NOTE — Telephone Encounter (Signed)
Primary Cardiologist:Kenneth C Hilty, MD   Preoperative team, please contact this patient and set up a phone call appointment for further preoperative risk assessment. Please obtain consent and complete medication review. Thank you for your help.   Pending no symptoms of ACS or worsening claudication and per office protocol, she may hold Plavix for 5 days prior to procedure and should resume as soon as hemodynamically stable postoperatively. Ideally aspirin should be continued without interruption, however if the bleeding risk is too great, aspirin may be held for 5-7 days prior to surgery. Please resume aspirin post operatively when it is felt to be safe from a bleeding standpoint.     Emmaline Life, NP-C  01/01/2023, 8:39 AM [address] Office [phone] Fax [phone]

## 2023-01-01 NOTE — Telephone Encounter (Signed)
Spoke with patient who is agreeable to do a tele visit on 4/15 at 3 pm. Patient states that she is no longer taking Plavix because she thought that Dr. Gwenlyn Found told her not to take it at her last office visit. Patient states that she is still taking ASA 81 mg QD. Med rec and consent have been completed.

## 2023-01-03 ENCOUNTER — Telehealth: Payer: Self-pay | Admitting: Internal Medicine

## 2023-01-03 NOTE — Telephone Encounter (Signed)
Approved until 01/03/24

## 2023-01-03 NOTE — Telephone Encounter (Signed)
PA for [employer] submitted via [place] (Key: [id number])

## 2023-01-04 ENCOUNTER — Other Ambulatory Visit: Payer: Self-pay | Admitting: Podiatry

## 2023-01-04 DIAGNOSIS — I739 Peripheral vascular disease, unspecified: Secondary | ICD-10-CM

## 2023-01-05 ENCOUNTER — Ambulatory Visit (HOSPITAL_COMMUNITY)
Admission: RE | Admit: 2023-01-05 | Discharge: 2023-01-05 | Disposition: A | Discharge status: Home / Self Care | Payer: 59 | Source: Ambulatory Visit | Attending: Cardiovascular Disease | Admitting: Cardiovascular Disease

## 2023-01-05 DIAGNOSIS — I739 Peripheral vascular disease, unspecified: Secondary | ICD-10-CM | POA: Diagnosis present

## 2023-01-06 LAB — VAS US ABI WITH/WO TBI
Left ABI: 1.06
Right ABI: 1.1

## 2023-01-10 ENCOUNTER — Telehealth: Payer: Self-pay | Admitting: Urology

## 2023-01-10 NOTE — Telephone Encounter (Signed)
DOS - 02/08/23  HAMMERTOE REPAIR 2ND LEFT --- [employer] [id number] HAS BEEN APPROVED, AUTH # [id number], GOOD FROM  02/08/23 - 05/09/23.

## 2023-01-15 ENCOUNTER — Other Ambulatory Visit: Payer: Self-pay | Admitting: Cardiovascular Disease

## 2023-01-15 ENCOUNTER — Other Ambulatory Visit: Payer: Self-pay | Admitting: Neurology

## 2023-01-16 NOTE — Telephone Encounter (Signed)
Last seen on 11/08/22 Follow up scheduled on 05/09/23 Last filled on 12/02/22 # 90 tablets (30 day supply)

## 2023-01-22 ENCOUNTER — Ambulatory Visit: Payer: 59 | Attending: Cardiovascular Disease

## 2023-01-22 DIAGNOSIS — Z01810 Encounter for preprocedural cardiovascular examination: Secondary | ICD-10-CM

## 2023-01-22 NOTE — Progress Notes (Signed)
Virtual Visit via Telephone Note   Because of Anna Henson's co-morbid illnesses, she is at least at moderate risk for complications without adequate follow up.  This format is felt to be most appropriate for this patient at this time.  The patient did not have access to video technology/had technical difficulties with video requiring transitioning to audio format only (telephone).  All issues noted in this document were discussed and addressed.  No physical exam could be performed with this format.  Please refer to the patient's chart for her consent to telehealth for [facility].  Evaluation Performed:  Preoperative cardiovascular risk assessment _____________   Date:  01/22/2023   Patient ID:  Anna Henson, DOB [birth year], MRN [id number] Patient Location:  Home Provider location:   Office  Primary Care Provider:  Patient, No Pcp Per Primary Cardiologist:  Chrystie Nose, MD  Chief Complaint / Patient Profile   52 y.o. y/o female with a h/o CAD with PCI most recently in 2018, hypertension, hyperlipidemia who is pending hammertoe repair second toe, left foot and presents today for telephonic preoperative cardiovascular risk assessment.  History of Present Illness    Anna Henson is a 52 y.o. female who presents via audio/video conferencing for a telehealth visit today.  Pt was last seen in cardiology clinic on 08/15/2022 by Dr. Gery Pray.  At that time Anna Henson was doing well.  The patient is now pending procedure as outlined above. Since her last visit, she states that she has been doing fine.  Sometimes her blood pressure is high but the medication brings it down to about 130/80.  She has transverse myelitis as she does not move much.  She was however, able to pass the 4 METS minimum requirement.  She should be fine to continue her aspirin throughout the procedure, however if the surgeon deems the risk of bleeding too high, she can hold her aspirin for 5 to 7 days prior.   Please restart when medically safe to do so.  Past Medical History    Past Medical History:  Diagnosis Date   Anemia    Anxiety    Bipolar disorder in full remission (HCC)    CAD in native artery cardiologist--  dr hilty   a. 09/2014 Cath/PCI in setting of [place] - s/p 2.25 x 12 mm Promus Premier DES to mid RCA;  b. 11/2016 Myoview: EF 56%, no ischemia;  c. 12/2016 NSTEMI/PCI: LM nl, LAd 25p, LCX 30p, RCA 100p (2.75x38 Promus Premier DES), mRCA 10 ISR, EF 50-55%.   CKD (chronic kidney disease), stage III (HCC)    Depression    Genetic testing 04/23/2017   Ms. Raynor underwent genetic counseling and testing for hereditary cancer syndromes on 04/05/2017. Her results were negative for mutations in all 46 genes analyzed by [facility]'s 46-gene Common Hereditary Cancers Panel. Genes analyzed include: APC, ATM, AXIN2, BARD1, BMPR1A, BRCA1, BRCA2, BRIP1, CDH1, CDKN2A, CHEK2, CTNNA1, DICER1, EPCAM, GREM1, HOXB13, KIT, MEN1, MLH1, MSH2, MSH3, MSH6, MUTYH, NBN,   GERD (gastroesophageal reflux disease)    Headache    History of external beam radiation therapy    right breast 07-27-2017  to 09-25-2017   History of non-ST elevation myocardial infarction (NSTEMI)    HOCM (hypertrophic obstructive cardiomyopathy) (HCC) followed by cardiology   a. 12/2016 Echo: EF 65-70%,  mod conc LVH, dynamic obstruction @ rest, peak velocity of 291 cm/sec w/ peak gradient of , no rwma, Gr1 DD, triv TR, PASP .   Hyperlipidemia  Hypertension    Malignant neoplasm of lower-outer quadrant of right breast of female, estrogen receptor positive [facility]) oncologist--- dr Pamelia Hoit   dx 06/ 2018--- right breast invasive lobular carcinoma, ductal carcinoma w/ LCIS---- 06-21-2017 s/p right breast lumpecotmy w/ sln disseciton;   completed radiation 09-25-2017;  on tamoxifen   Myocardial infarction [facility])    2017   S/P drug eluting coronary stent placement    09-14-2014---  PCI with DES x1 to midRCA;  12-31-2016  PCI with DES x1 to  proxRCA   Transverse myelitis [facility]) neurologist--- dr Epimenio Foot   01/ 2017  dx transverse myelitis w/ right side numbness (09-01-2019  currently lower extremity weakness, mucsle spasms, and gait disturbance)   Wears glasses    Past Surgical History:  Procedure Laterality Date   ABDOMINAL AORTOGRAM W/LOWER EXTREMITY N/A 08/11/2021   Procedure: ABDOMINAL AORTOGRAM W/LOWER EXTREMITY;  Surgeon: Runell Gess, MD;  [facility]: [facility] INVASIVE CV LAB;  Service: Cardiovascular;  Laterality: N/A;   BREAST LUMPECTOMY WITH RADIOACTIVE SEED AND SENTINEL LYMPH NODE BIOPSY Right 06/21/2017   Procedure: RIGHT BREAST BRACKETED SEED GUIDED LUMPECTOMY AND SENTINEL LYMPH NODE BIOPSY;  Surgeon: Griselda Miner, MD;  Location: [facility] OR;  Service: General;  Laterality: Right;   CORONARY/GRAFT ACUTE MI REVASCULARIZATION N/A 12/31/2016   Procedure: Coronary/Graft Acute MI Revascularization;  Surgeon: Tonny Bollman, MD;  Location: [facility] INVASIVE CV LAB;  Service: Cardiovascular;  Laterality: N/A;   DILATATION & CURETTAGE/HYSTEROSCOPY WITH MYOSURE N/A 09/02/2019   Procedure: DILATATION & CURETTAGE/HYSTEROSCOPY WITH MYOSURE;  Surgeon: Noland Fordyce, MD;  Location: [facility];  Service: Gynecology;  Laterality: N/A;   HERNIA REPAIR     LEFT HEART CATH AND CORONARY ANGIOGRAPHY N/A 12/31/2016   Procedure: Left Heart Cath and Coronary Angiography;  Surgeon: Tonny Bollman, MD;  Location: [facility] INVASIVE CV LAB;  Service: Cardiovascular;  Laterality: N/A;   LEFT HEART CATHETERIZATION WITH CORONARY ANGIOGRAM N/A 09/14/2014   Procedure: LEFT HEART CATHETERIZATION WITH CORONARY ANGIOGRAM;  Surgeon: Kathleene Hazel, MD;  Location[facility] CATH LAB;  Service: Cardiovascular;  Laterality: N/A;   MASS EXCISION N/A 03/30/2020   Procedure: EXCISION MASS RIGHT LABIA MINORA;  Surgeon: Carver Fila, MD;  Location: [facility] ORS;  Service: Gynecology;  Laterality: N/A;   PERCUTANEOUS CORONARY STENT INTERVENTION (PCI-S)  09/14/2014    Procedure: PERCUTANEOUS CORONARY STENT INTERVENTION (PCI-S);  Surgeon: Kathleene Hazel, MD;  Location[facility] CATH LAB;  Service: Cardiovascular;;   PERIPHERAL VASCULAR INTERVENTION  08/11/2021   Procedure: PERIPHERAL VASCULAR INTERVENTION;  Surgeon: Runell Gess, MD;  [facility] INVASIVE CV LAB;  Service: Cardiovascular;;   ROBOTIC ASSISTED SALPINGO OOPHERECTOMY Bilateral 03/30/2020   Procedure: XI ROBOTIC ASSISTED SALPINGO OOPHORECTOMY;  Surgeon: Carver Fila, MD;  Location: [facility] ORS;  Service: Gynecology;  Laterality: Bilateral;   TONSILLECTOMY  2005   UMBILICAL HERNIA REPAIR  2001; 2005    Allergies  Allergies  Allergen Reactions   Pork-Derived Products Other (See Comments)    Does NOT eat pork    Home Medications    Prior to Admission medications   Medication Sig Start Date End Date Taking? Authorizing Provider  amLODipine (NORVASC) 10 MG tablet TAKE 1 TABLET BY MOUTH EVERY DAY 10/04/22   Hilty, Lisette Abu, MD  anastrozole (ARIMIDEX) 1 MG tablet TAKE 1 TABLET(1 MG) BY MOUTH DAILY 12/21/22   Serena Croissant, MD  aspirin 81 MG chewable tablet Chew 1 tablet (81 mg total) by mouth daily. 09/16/14   Rhetta Mura, MD  atorvastatin (LIPITOR) 80  MG tablet TAKE 1 TABLET BY MOUTH EVERY DAY 10/03/22   Runell Gess, MD  baclofen (LIORESAL) 10 MG tablet TAKE 1 TABLET BY MOUTH 4 TIMES DAILY 09/28/22   Sater, Pearletha Furl, MD  clopidogrel (PLAVIX) 75 MG tablet Take 1 tablet (75 mg total) by mouth daily with breakfast. Patient not taking: Reported on 01/01/2023 09/09/21   Runell Gess, MD  diphenhydrAMINE (BENADRYL ALLERGY) 25 MG tablet Take 1 tablet (25 mg total) by mouth every 6 (six) hours for 3 days. Patient not taking: Reported on 01/01/2023 05/30/22 08/15/22  Theadora Rama Scales, PA-C  Evolocumab (REPATHA SURECLICK) 140 MG/ML SOAJ Inject 1 Dose into the skin every 14 (fourteen) days. 01/26/22   Hilty, Lisette Abu, MD  fluticasone (FLONASE) 50 MCG/ACT nasal spray Place 1-2  sprays into both nostrils daily as needed for allergies or rhinitis.    [provider]  furosemide (LASIX) 20 MG tablet TAKE 1 TABLET EVERY DAY FOR FLUID RETENTION 09/26/21   Hilty, Lisette Abu, MD  gabapentin (NEURONTIN) 300 MG capsule Take 3 capsules (900 mg total) by mouth 3 (three) times daily. 09/13/22   Sater, Pearletha Furl, MD  Isopropyl Alcohol (SWIMMERS EAR DROPS OT) Place 2 drops into both ears daily as needed (water in ears).    [provider]  lisinopril (ZESTRIL) 20 MG tablet TAKE 1 TABLET BY MOUTH TWICE DAILY 01/16/23   Runell Gess, MD  lisinopril (ZESTRIL) 40 MG tablet TAKE 1 TABLET BY MOUTH EVERY DAY 10/03/22   Runell Gess, MD  methocarbamol (ROBAXIN) 500 MG tablet TAKE 1 TABLET(500 MG) BY MOUTH THREE TIMES DAILY AS NEEDED FOR MUSCLE SPASMS 01/16/23   Sater, Pearletha Furl, MD  nitroGLYCERIN (NITROSTAT) 0.4 MG SL tablet PLACE 1 TABLET UNDER THE TONGUE EVERY 5 MINUTES AS NEEDED FOR CHEST PAIN 07/15/21   Chrystie Nose, MD  Oxcarbazepine (TRILEPTAL) 600 MG tablet TAKE 1 TABLET THREE TIMES A DAY 11/08/22   Sater, Pearletha Furl, MD  Probiotic Product (PROBIOTIC-10 PO) Take 1 capsule by mouth daily.    [provider]  spironolactone (ALDACTONE) 50 MG tablet Take 1 tablet each morning and 1/2 tablet each evening. 01/18/21   Runell Gess, MD  traMADol (ULTRAM) 50 MG tablet TAKE 1 TABLET(50 MG) BY MOUTH TWICE DAILY AS NEEDED 11/08/22   Sater, Pearletha Furl, MD  venlafaxine XR (EFFEXOR-XR) 75 MG 24 hr capsule TAKE 1 CAPSULE BY MOUTH DAILY WITH BREAKFAST 11/27/22   Serena Croissant, MD    Physical Exam    Vital Signs:  Anna Henson does not have vital signs available for review today.  Given telephonic nature of communication, physical exam is limited. AAOx3. NAD. Normal affect.  Speech and respirations are unlabored.  Accessory Clinical Findings    None  Assessment & Plan    1.  Preoperative Cardiovascular Risk Assessment:  Anna Henson perioperative risk of a  major cardiac event is 0.9% according to the Revised Cardiac Risk Index (RCRI).  Therefore, she is at low risk for perioperative complications.   Her functional capacity is good at 4.06 METs according to the Duke Activity Status Index (DASI). Recommendations: According to ACC/AHA guidelines, no further cardiovascular testing needed.  The patient may proceed to surgery at acceptable risk.   Antiplatelet and/or Anticoagulation Recommendations: The patient should remain on Aspirin without interruption.      A copy of this note will be routed to requesting surgeon.  Time:   Today, I have spent 10 minutes with the patient  with telehealth technology discussing medical history, symptoms, and management plan.     Sharlene Dory, PA-C  01/22/2023, 3:01 PM

## 2023-02-08 ENCOUNTER — Other Ambulatory Visit: Payer: Self-pay | Admitting: Podiatry

## 2023-02-08 ENCOUNTER — Encounter: Payer: Self-pay | Admitting: Podiatry

## 2023-02-08 DIAGNOSIS — M2042 Other hammer toe(s) (acquired), left foot: Secondary | ICD-10-CM | POA: Diagnosis not present

## 2023-02-08 MED ORDER — OXYCODONE-ACETAMINOPHEN 5-325 MG PO TABS: 1.0000 | ORAL_TABLET | ORAL | 0 refills | Status: DC | PRN

## 2023-02-08 MED ORDER — MELOXICAM 15 MG PO TABS: 15.0000 mg | ORAL_TABLET | Freq: Every day | ORAL | 1 refills | Status: DC

## 2023-02-08 NOTE — Progress Notes (Signed)
PRN postop 

## 2023-02-14 ENCOUNTER — Ambulatory Visit (INDEPENDENT_AMBULATORY_CARE_PROVIDER_SITE_OTHER): Payer: 59

## 2023-02-14 ENCOUNTER — Ambulatory Visit (INDEPENDENT_AMBULATORY_CARE_PROVIDER_SITE_OTHER): Payer: 59 | Admitting: Podiatry

## 2023-02-14 DIAGNOSIS — M2042 Other hammer toe(s) (acquired), left foot: Secondary | ICD-10-CM

## 2023-02-14 DIAGNOSIS — Z9889 Other specified postprocedural states: Secondary | ICD-10-CM

## 2023-02-14 NOTE — Progress Notes (Signed)
Chief Complaint  Patient presents with   Post-op Follow-up    POV # 1 DOS 02/08/23 --- LEFT 2ND TOE ARTHROPLASTY, NO PAIN TODAY, PATIENT STATED SHE IS DOING WELL    Subjective:  Patient presents today status post hammertoe arthroplasty of the second digit left foot.  Patient doing very well.  WBAT in surgical shoe as instructed.  No new complaints  Past Medical History:  Diagnosis Date   Anemia    Anxiety    Bipolar disorder in full remission (HCC)    CAD in native artery cardiologist--  dr hilty   a. 09/2014 Cath/PCI in setting of [place] - s/p 2.25 x 12 mm Promus Premier DES to mid RCA;  b. 11/2016 Myoview: EF 56%, no ischemia;  c. 12/2016 NSTEMI/PCI: LM nl, LAd 25p, LCX 30p, RCA 100p (2.75x38 Promus Premier DES), mRCA 10 ISR, EF 50-55%.   CKD (chronic kidney disease), stage III (HCC)    Depression    Genetic testing 04/23/2017   Ms. Halperin underwent genetic counseling and testing for hereditary cancer syndromes on 04/05/2017. Her results were negative for mutations in all 46 genes analyzed by [facility]'s 46-gene Common Hereditary Cancers Panel. Genes analyzed include: APC, ATM, AXIN2, BARD1, BMPR1A, BRCA1, BRCA2, BRIP1, CDH1, CDKN2A, CHEK2, CTNNA1, DICER1, EPCAM, GREM1, HOXB13, KIT, MEN1, MLH1, MSH2, MSH3, MSH6, MUTYH, NBN,   GERD (gastroesophageal reflux disease)    Headache    History of external beam radiation therapy    right breast 07-27-2017  to 09-25-2017   History of non-ST elevation myocardial infarction (NSTEMI)    HOCM (hypertrophic obstructive cardiomyopathy) (HCC) followed by cardiology   a. 12/2016 Echo: EF 65-70%,  mod conc LVH, dynamic obstruction @ rest, peak velocity of 291 cm/sec w/ peak gradient of , no rwma, Gr1 DD, triv TR, PASP .   Hyperlipidemia    Hypertension    Malignant neoplasm of lower-outer quadrant of right breast of female, estrogen receptor positive [facility]) oncologist--- dr Pamelia Hoit   dx 06/ 2018--- right breast invasive lobular carcinoma, ductal  carcinoma w/ LCIS---- 06-21-2017 s/p right breast lumpecotmy w/ sln disseciton;   completed radiation 09-25-2017;  on tamoxifen   Myocardial infarction [facility])    2017   S/P drug eluting coronary stent placement    09-14-2014---  PCI with DES x1 to midRCA;  12-31-2016  PCI with DES x1 to proxRCA   Transverse myelitis [facility]) neurologist--- dr Epimenio Foot   01/ 2017  dx transverse myelitis w/ right side numbness (09-01-2019  currently lower extremity weakness, mucsle spasms, and gait disturbance)   Wears glasses     Past Surgical History:  Procedure Laterality Date   ABDOMINAL AORTOGRAM W/LOWER EXTREMITY N/A 08/11/2021   Procedure: ABDOMINAL AORTOGRAM W/LOWER EXTREMITY;  Surgeon: Runell Gess, MD;  [facility]: [facility] INVASIVE CV LAB;  Service: Cardiovascular;  Laterality: N/A;   BREAST LUMPECTOMY WITH RADIOACTIVE SEED AND SENTINEL LYMPH NODE BIOPSY Right 06/21/2017   Procedure: RIGHT BREAST BRACKETED SEED GUIDED LUMPECTOMY AND SENTINEL LYMPH NODE BIOPSY;  Surgeon: Griselda Miner, MD;  Location: [facility] OR;  Service: General;  Laterality: Right;   CORONARY/GRAFT ACUTE MI REVASCULARIZATION N/A 12/31/2016   Procedure: Coronary/Graft Acute MI Revascularization;  Surgeon: Tonny Bollman, MD;  Location: [facility] INVASIVE CV LAB;  Service: Cardiovascular;  Laterality: N/A;   DILATATION & CURETTAGE/HYSTEROSCOPY WITH MYOSURE N/A 09/02/2019   Procedure: DILATATION & CURETTAGE/HYSTEROSCOPY WITH MYOSURE;  Surgeon: Noland Fordyce, MD;  Location: [facility];  Service: Gynecology;  Laterality: N/A;   HERNIA REPAIR  LEFT HEART CATH AND CORONARY ANGIOGRAPHY N/A 12/31/2016   Procedure: Left Heart Cath and Coronary Angiography;  Surgeon: Tonny Bollman, MD;  Location[facility] INVASIVE CV LAB;  Service: Cardiovascular;  Laterality: N/A;   LEFT HEART CATHETERIZATION WITH CORONARY ANGIOGRAM N/A 09/14/2014   Procedure: LEFT HEART CATHETERIZATION WITH CORONARY ANGIOGRAM;  Surgeon: Kathleene Hazel, MD;  Location: [facility] CATH  LAB;  Service: Cardiovascular;  Laterality: N/A;   MASS EXCISION N/A 03/30/2020   Procedure: EXCISION MASS RIGHT LABIA MINORA;  Surgeon: Carver Fila, MD;  Location: [facility];  Service: Gynecology;  Laterality: N/A;   PERCUTANEOUS CORONARY STENT INTERVENTION (PCI-S)  09/14/2014   Procedure: PERCUTANEOUS CORONARY STENT INTERVENTION (PCI-S);  Surgeon: Kathleene Hazel, MD;  Location: [facility] CATH LAB;  Service: Cardiovascular;;   PERIPHERAL VASCULAR INTERVENTION  08/11/2021   Procedure: PERIPHERAL VASCULAR INTERVENTION;  Surgeon: Runell Gess, MD;  [facility] INVASIVE CV LAB;  Service: Cardiovascular;;   ROBOTIC ASSISTED SALPINGO OOPHERECTOMY Bilateral 03/30/2020   Procedure: XI ROBOTIC ASSISTED SALPINGO OOPHORECTOMY;  Surgeon: Carver Fila, MD;  Location: [facility]S;  Service: Gynecology;  Laterality: Bilateral;   TONSILLECTOMY  2005   UMBILICAL HERNIA REPAIR  2001; 2005    Allergies  Allergen Reactions   Pork-Derived Products Other (See Comments)    Does NOT eat pork    Objective/Physical Exam Neurovascular status intact.  Incision well coapted with sutures intact. No sign of infectious process noted. No dehiscence. No active bleeding noted.  No edema.  Good rectus alignment of the second toe  Radiographic Exam LT foot 02/14/2023:  Good alignment of the second toe.  Percutaneous fixation pin intact.  Assessment: 1. s/p hammertoe arthroplasty second digit left. DOS: 02/08/2023  -Patient evaluated.  X-rays reviewed -Patient may begin washing and showering getting the foot wet -Continue WBAT surgical shoe -Return to clinic 1 week suture removal  Felecia Shelling, DPM [facility]  Dr. Felecia Shelling, DPM    [address] [place], [place] [address]                Office [phone]  Fax [phone]

## 2023-02-21 ENCOUNTER — Ambulatory Visit (INDEPENDENT_AMBULATORY_CARE_PROVIDER_SITE_OTHER): Payer: 59 | Admitting: Podiatry

## 2023-02-21 DIAGNOSIS — M2042 Other hammer toe(s) (acquired), left foot: Secondary | ICD-10-CM

## 2023-02-21 NOTE — Progress Notes (Signed)
Chief Complaint  Patient presents with   Post-op Follow-up    Follow up after sx, patient stated she's doing ok     Subjective:  Patient presents today status post hammertoe arthroplasty of the second digit left foot.  DOS: 02/08/2023. patient doing very well.  WBAT in surgical shoe as instructed.  No new complaints  Past Medical History:  Diagnosis Date   Anemia    Anxiety    Bipolar disorder in full remission (HCC)    CAD in native artery cardiologist--  dr hilty   a. 09/2014 Cath/PCI in setting of [place] - s/p 2.25 x 12 mm Promus Premier DES to mid RCA;  b. 11/2016 Myoview: EF 56%, no ischemia;  c. 12/2016 NSTEMI/PCI: LM nl, LAd 25p, LCX 30p, RCA 100p (2.75x38 Promus Premier DES), mRCA 10 ISR, EF 50-55%.   CKD (chronic kidney disease), stage III (HCC)    Depression    Genetic testing 04/23/2017   Ms. Markwood underwent genetic counseling and testing for hereditary cancer syndromes on 04/05/2017. Her results were negative for mutations in all 46 genes analyzed by [facility]'s 46-gene Common Hereditary Cancers Panel. Genes analyzed include: APC, ATM, AXIN2, BARD1, BMPR1A, BRCA1, BRCA2, BRIP1, CDH1, CDKN2A, CHEK2, CTNNA1, DICER1, EPCAM, GREM1, HOXB13, KIT, MEN1, MLH1, MSH2, MSH3, MSH6, MUTYH, NBN,   GERD (gastroesophageal reflux disease)    Headache    History of external beam radiation therapy    right breast 07-27-2017  to 09-25-2017   History of non-ST elevation myocardial infarction (NSTEMI)    HOCM (hypertrophic obstructive cardiomyopathy) (HCC) followed by cardiology   a. 12/2016 Echo: EF 65-70%,  mod conc LVH, dynamic obstruction @ rest, peak velocity of 291 cm/sec w/ peak gradient of , no rwma, Gr1 DD, triv TR, PASP .   Hyperlipidemia    Hypertension    Malignant neoplasm of lower-outer quadrant of right breast of female, estrogen receptor positive [facility]) oncologist--- dr Pamelia Hoit   dx 06/ 2018--- right breast invasive lobular carcinoma, ductal carcinoma w/ LCIS---- 06-21-2017  s/p right breast lumpecotmy w/ sln disseciton;   completed radiation 09-25-2017;  on tamoxifen   Myocardial infarction [facility])    2017   S/P drug eluting coronary stent placement    09-14-2014---  PCI with DES x1 to midRCA;  12-31-2016  PCI with DES x1 to proxRCA   Transverse myelitis [facility]) neurologist--- dr Epimenio Foot   01/ 2017  dx transverse myelitis w/ right side numbness (09-01-2019  currently lower extremity weakness, mucsle spasms, and gait disturbance)   Wears glasses     Past Surgical History:  Procedure Laterality Date   ABDOMINAL AORTOGRAM W/LOWER EXTREMITY N/A 08/11/2021   Procedure: ABDOMINAL AORTOGRAM W/LOWER EXTREMITY;  Surgeon: Runell Gess, MD;  [facility] LAB;  Service: Cardiovascular;  Laterality: N/A;   BREAST LUMPECTOMY WITH RADIOACTIVE SEED AND SENTINEL LYMPH NODE BIOPSY Right 06/21/2017   Procedure: RIGHT BREAST BRACKETED SEED GUIDED LUMPECTOMY AND SENTINEL LYMPH NODE BIOPSY;  Surgeon: Griselda Miner, MD;  [facility]: [facility] OR;  Service: General;  Laterality: Right;   CORONARY/GRAFT ACUTE MI REVASCULARIZATION N/A 12/31/2016   Procedure: Coronary/Graft Acute MI Revascularization;  Surgeon: Tonny Bollman, MD;  Location: [facility] INVASIVE CV LAB;  Service: Cardiovascular;  Laterality: N/A;   DILATATION & CURETTAGE/HYSTEROSCOPY WITH MYOSURE N/A 09/02/2019   Procedure: DILATATION & CURETTAGE/HYSTEROSCOPY WITH MYOSURE;  Surgeon: Noland Fordyce, MD;  Location: [facility];  Service: Gynecology;  Laterality: N/A;   HERNIA REPAIR     LEFT HEART CATH AND  CORONARY ANGIOGRAPHY N/A 12/31/2016   Procedure: Left Heart Cath and Coronary Angiography;  Surgeon: Tonny Bollman, MD;  Location: [facility] INVASIVE CV LAB;  Service: Cardiovascular;  Laterality: N/A;   LEFT HEART CATHETERIZATION WITH CORONARY ANGIOGRAM N/A 09/14/2014   Procedure: LEFT HEART CATHETERIZATION WITH CORONARY ANGIOGRAM;  Surgeon: Kathleene Hazel, MD;  Location: [facility] CATH LAB;  Service: Cardiovascular;   Laterality: N/A;   MASS EXCISION N/A 03/30/2020   Procedure: EXCISION MASS RIGHT LABIA MINORA;  Surgeon: Carver Fila, MD;  Location: [facility]S;  Service: Gynecology;  Laterality: N/A;   PERCUTANEOUS CORONARY STENT INTERVENTION (PCI-S)  09/14/2014   Procedure: PERCUTANEOUS CORONARY STENT INTERVENTION (PCI-S);  Surgeon: Kathleene Hazel, MD;  Location: [facility] CATH LAB;  Service: Cardiovascular;;   PERIPHERAL VASCULAR INTERVENTION  08/11/2021   Procedure: PERIPHERAL VASCULAR INTERVENTION;  Surgeon: Runell Gess, MD;  [facility];  Service: Cardiovascular;;   ROBOTIC ASSISTED SALPINGO OOPHERECTOMY Bilateral 03/30/2020   Procedure: XI ROBOTIC ASSISTED SALPINGO OOPHORECTOMY;  Surgeon: Carver Fila, MD;  Location: [facility];  Service: Gynecology;  Laterality: Bilateral;   TONSILLECTOMY  2005   UMBILICAL HERNIA REPAIR  2001; 2005    Allergies  Allergen Reactions   Pork-Derived Products Other (See Comments)    Does NOT eat pork    Objective/Physical Exam Neurovascular status intact.  Incision well coapted with sutures intact. No sign of infectious process noted. No dehiscence. No active bleeding noted.  No edema.  Good rectus alignment of the second toe  Radiographic Exam LT foot 02/14/2023:  Good alignment of the second toe.  Percutaneous fixation pin intact.  Assessment: 1. s/p hammertoe arthroplasty second digit left. DOS: 02/08/2023  -Patient evaluated.  -Sutures removed -Debridement of the callus to the plantar aspect of the toe was performed today using a tissue nipper -Dressings applied.  Clean dry and intact x 1 week -Continue WBAT surgical shoe -Return to clinic 1 week.  Ideally I would like to leave the percutaneous fixation pin in for an additional 2-3 weeks  Felecia Shelling, DPM [facility]  Dr. Felecia Shelling, DPM    [address] [place], [place] [address]                Office [phone]  Fax  [phone]

## 2023-02-26 ENCOUNTER — Ambulatory Visit (INDEPENDENT_AMBULATORY_CARE_PROVIDER_SITE_OTHER): Payer: 59 | Admitting: Podiatry

## 2023-02-26 ENCOUNTER — Ambulatory Visit (INDEPENDENT_AMBULATORY_CARE_PROVIDER_SITE_OTHER): Payer: 59

## 2023-02-26 DIAGNOSIS — M2042 Other hammer toe(s) (acquired), left foot: Secondary | ICD-10-CM | POA: Diagnosis not present

## 2023-02-26 NOTE — Progress Notes (Signed)
Chief Complaint  Patient presents with   Routine Post Op    POV # 3 DOS 02/08/23 --- LEFT 2ND TOE ARTHROPLASTY    Subjective:  Patient presents today status post hammertoe arthroplasty of the second digit left foot.  DOS: 02/08/2023. patient doing very well.  WBAT in surgical shoe as instructed.  No new complaints  Past Medical History:  Diagnosis Date   Anemia    Anxiety    Bipolar disorder in full remission (HCC)    CAD in native artery cardiologist--  dr hilty   a. 09/2014 Cath/PCI in setting of [place] - s/p 2.25 x 12 mm Promus Premier DES to mid RCA;  b. 11/2016 Myoview: EF 56%, no ischemia;  c. 12/2016 NSTEMI/PCI: LM nl, LAd 25p, LCX 30p, RCA 100p (2.75x38 Promus Premier DES), mRCA 10 ISR, EF 50-55%.   CKD (chronic kidney disease), stage III (HCC)    Depression    Genetic testing 04/23/2017   Ms. Polhamus underwent genetic counseling and testing for hereditary cancer syndromes on 04/05/2017. Her results were negative for mutations in all 46 genes analyzed by [facility]'s 46-gene Common Hereditary Cancers Panel. Genes analyzed include: APC, ATM, AXIN2, BARD1, BMPR1A, BRCA1, BRCA2, BRIP1, CDH1, CDKN2A, CHEK2, CTNNA1, DICER1, EPCAM, GREM1, HOXB13, KIT, MEN1, MLH1, MSH2, MSH3, MSH6, MUTYH, NBN,   GERD (gastroesophageal reflux disease)    Headache    History of external beam radiation therapy    right breast 07-27-2017  to 09-25-2017   History of non-ST elevation myocardial infarction (NSTEMI)    HOCM (hypertrophic obstructive cardiomyopathy) (HCC) followed by cardiology   a. 12/2016 Echo: EF 65-70%,  mod conc LVH, dynamic obstruction @ rest, peak velocity of 291 cm/sec w/ peak gradient of , no rwma, Gr1 DD, triv TR, PASP .   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--- dr Epimenio Foot   01/ 2017  dx transverse myelitis w/ right side numbness (09-01-2019  currently lower extremity weakness, mucsle spasms, and gait disturbance)   Wears glasses     Past Surgical History:  Procedure Laterality Date   ABDOMINAL AORTOGRAM W/LOWER EXTREMITY N/A 08/11/2021   Procedure: ABDOMINAL AORTOGRAM W/LOWER EXTREMITY;  Surgeon: Runell Gess, MD;  [facility];  Service: Cardiovascular;  Laterality: N/A;   BREAST LUMPECTOMY WITH RADIOACTIVE SEED AND SENTINEL LYMPH NODE BIOPSY Right 06/21/2017   Procedure: RIGHT BREAST BRACKETED SEED GUIDED LUMPECTOMY AND SENTINEL LYMPH NODE BIOPSY;  Surgeon: Griselda Miner, MD;  [facility];  Service: General;  Laterality: Right;   CORONARY/GRAFT ACUTE MI REVASCULARIZATION N/A 12/31/2016   Procedure: Coronary/Graft Acute MI Revascularization;  Surgeon: Tonny Bollman, MD;  Location: [facility] INVASIVE CV LAB;  Service: Cardiovascular;  Laterality: N/A;   DILATATION & CURETTAGE/HYSTEROSCOPY WITH MYOSURE N/A 09/02/2019   Procedure: DILATATION & CURETTAGE/HYSTEROSCOPY WITH MYOSURE;  Surgeon: Noland Fordyce, MD;  Location: [facility] ;  Service: Gynecology;  Laterality: N/A;   HERNIA REPAIR     LEFT HEART CATH  AND CORONARY ANGIOGRAPHY N/A 12/31/2016   Procedure: Left Heart Cath and Coronary Angiography;  Surgeon: Tonny Bollman, MD;  Location: [facility] INVASIVE CV LAB;  Service: Cardiovascular;  Laterality: N/A;   LEFT HEART CATHETERIZATION WITH CORONARY ANGIOGRAM N/A 09/14/2014   Procedure: LEFT HEART CATHETERIZATION WITH CORONARY ANGIOGRAM;  Surgeon: Kathleene Hazel, MD;  Location: [facility] CATH LAB;  Service: Cardiovascular;   Laterality: N/A;   MASS EXCISION N/A 03/30/2020   Procedure: EXCISION MASS RIGHT LABIA MINORA;  Surgeon: Carver Fila, MD;  [facility];  Service: Gynecology;  Laterality: N/A;   PERCUTANEOUS CORONARY STENT INTERVENTION (PCI-S)  09/14/2014   Procedure: PERCUTANEOUS CORONARY STENT INTERVENTION (PCI-S);  Surgeon: Kathleene Hazel, MD;  Location: [facility] CATH LAB;  Service: Cardiovascular;;   PERIPHERAL VASCULAR INTERVENTION  08/11/2021   Procedure: PERIPHERAL VASCULAR INTERVENTION;  Surgeon: Runell Gess, MD;  [facility];  Service: Cardiovascular;;   ROBOTIC ASSISTED SALPINGO OOPHERECTOMY Bilateral 03/30/2020   Procedure: XI ROBOTIC ASSISTED SALPINGO OOPHORECTOMY;  Surgeon: Carver Fila, MD;  [facility];  Service: Gynecology;  Laterality: Bilateral;   TONSILLECTOMY  2005   UMBILICAL HERNIA REPAIR  2001; 2005    Allergies  Allergen Reactions   Pork-Derived Products Other (See Comments)    Does NOT eat pork    Objective/Physical Exam Neurovascular status intact.  The incision site along the dorsum of the toe is slightly inverted.  Overall it appears that it has healed routinely with complete reepithelialization but there is some inversion at the incision site.  No infection.  No edema.  Percutaneous fixation pin is intact and stable  Radiographic Exam LT foot 02/26/2023:  Essentially unchanged from prior x-rays.  Good alignment of the second toe.  Percutaneous fixation pin intact.  Assessment: 1. s/p hammertoe arthroplasty second digit left. DOS: 02/08/2023  -Patient evaluated.  - Ideally would like to leave the percutaneous fixation pin intact for an additional 3 weeks -Continue WBAT surgical shoe -Return to clinic 3 weeks percutaneous fixation pin removal and follow-up x-ray  Felecia Shelling, DPM [facility]  Dr. Felecia Shelling, DPM    [address] [place], [place] [address]                 Office [phone]  Fax [phone]

## 2023-03-07 ENCOUNTER — Encounter: Payer: 59 | Admitting: Podiatry

## 2023-03-09 ENCOUNTER — Other Ambulatory Visit: Payer: Self-pay | Admitting: Podiatry

## 2023-03-09 ENCOUNTER — Telehealth: Payer: Self-pay | Admitting: Podiatry

## 2023-03-09 DIAGNOSIS — M2042 Other hammer toe(s) (acquired), left foot: Secondary | ICD-10-CM

## 2023-03-09 NOTE — Telephone Encounter (Signed)
Patient called.  She had toe arthroplasty on 5/2, and at last visit, you said you would leave the pin in another 3 weeks.  She notes the the pin is getting pretty uncomfortable at the MPJ ("where the toe meets the foot").  Initially, she stated it was numb, then uncomfortable, now 5/10 pain during our conversation.    She has no more Percocet left after the surgery and only has 5-10 meloxicam remaining.    Advised her to at least start taking the meloxicam again, and that you'd be advised of her pain since she feels like the meloxicam doesn't work as well for her.

## 2023-03-10 ENCOUNTER — Other Ambulatory Visit: Payer: Self-pay | Admitting: Neurology

## 2023-03-19 ENCOUNTER — Ambulatory Visit (INDEPENDENT_AMBULATORY_CARE_PROVIDER_SITE_OTHER): Payer: 59

## 2023-03-19 ENCOUNTER — Encounter: Payer: Self-pay | Admitting: Podiatry

## 2023-03-19 ENCOUNTER — Ambulatory Visit (INDEPENDENT_AMBULATORY_CARE_PROVIDER_SITE_OTHER): Payer: 59 | Admitting: Podiatry

## 2023-03-19 DIAGNOSIS — M2042 Other hammer toe(s) (acquired), left foot: Secondary | ICD-10-CM

## 2023-03-19 DIAGNOSIS — Z9889 Other specified postprocedural states: Secondary | ICD-10-CM

## 2023-03-19 NOTE — Progress Notes (Signed)
Chief Complaint  Patient presents with   Routine Post Op    POV # 4 DOS 02/08/23 --- LEFT 2ND TOE ARTHROPLASTY  "I tripped and fell over some extension cords yesterday, so I hope I didn't mess anything up"    Subjective:  Patient presents today status post hammertoe arthroplasty of the second digit left foot.  DOS: 02/08/2023.  Patient continues to do very well.  WBAT in the surgical shoe as instructed.  No new complaints  Past Medical History:  Diagnosis Date   Anemia    Anxiety    Bipolar disorder in full remission (HCC)    CAD in native artery cardiologist--  dr hilty   a. 09/2014 Cath/PCI in setting of [place] - s/p 2.25 x 12 mm Promus Premier DES to mid RCA;  b. 11/2016 Myoview: EF 56%, no ischemia;  c. 12/2016 NSTEMI/PCI: LM nl, LAd 25p, LCX 30p, RCA 100p (2.75x38 Promus Premier DES), mRCA 10 ISR, EF 50-55%.   CKD (chronic kidney disease), stage III (HCC)    Depression    Genetic testing 04/23/2017   Ms. [place]igua underwent genetic counseling and testing for hereditary cancer syndromes on 04/05/2017. Her results were negative for mutations in all 46 genes analyzed by [facility]'s 46-gene Common Hereditary Cancers Panel. Genes analyzed include: APC, ATM, AXIN2, BARD1, BMPR1A, BRCA1, BRCA2, BRIP1, CDH1, CDKN2A, CHEK2, CTNNA1, DICER1, EPCAM, GREM1, HOXB13, KIT, MEN1, MLH1, MSH2, MSH3, MSH6, MUTYH, NBN,   GERD (gastroesophageal reflux disease)    Headache    History of external beam radiation therapy    right breast 07-27-2017  to 09-25-2017   History of non-ST elevation myocardial infarction (NSTEMI)    HOCM (hypertrophic obstructive cardiomyopathy) (HCC) followed by cardiology   a. 12/2016 Echo: EF 65-70%,  mod conc LVH, dynamic obstruction @ rest, peak velocity of 291 cm/sec w/ peak gradient of , no rwma, Gr1 DD, triv TR, PASP .   Hyperlipidemia    Hypertension    Malignant neoplasm of lower-outer quadrant of right breast of female, estrogen receptor positive 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--- dr Epimenio Foot   01/ 2017  dx transverse myelitis w/ right side numbness (09-01-2019  currently lower extremity weakness, mucsle spasms, and gait disturbance)   Wears glasses     Past Surgical History:  Procedure Laterality Date   ABDOMINAL AORTOGRAM W/LOWER EXTREMITY N/A 08/11/2021   Procedure: ABDOMINAL AORTOGRAM W/LOWER EXTREMITY;  Surgeon: Runell Gess, MD;  [facility];  Service: Cardiovascular;  Laterality: N/A;   BREAST LUMPECTOMY WITH RADIOACTIVE SEED AND SENTINEL LYMPH NODE BIOPSY Right 06/21/2017   Procedure: RIGHT BREAST BRACKETED SEED GUIDED LUMPECTOMY AND SENTINEL LYMPH NODE BIOPSY;  Surgeon: Griselda Miner, MD;  [facility];  Service: General;  Laterality: Right;   CORONARY/GRAFT ACUTE MI REVASCULARIZATION N/A 12/31/2016   Procedure: Coronary/Graft Acute MI Revascularization;  Surgeon: Tonny Bollman, MD;  Location: [facility] INVASIVE CV LAB;  Service: Cardiovascular;  Laterality: N/A;   DILATATION & CURETTAGE/HYSTEROSCOPY WITH MYOSURE N/A 09/02/2019   Procedure: DILATATION & CURETTAGE/HYSTEROSCOPY WITH MYOSURE;  Surgeon: Noland Fordyce, MD;  Location: [facility];  Service: Gynecology;  Laterality: N/A;   HERNIA REPAIR     LEFT HEART CATH AND CORONARY ANGIOGRAPHY N/A 12/31/2016   Procedure: Left Heart Cath and Coronary Angiography;  Surgeon: Tonny Bollman, MD;  Location: [facility] INVASIVE CV LAB;  Service: Cardiovascular;  Laterality: N/A;   LEFT HEART CATHETERIZATION WITH CORONARY ANGIOGRAM N/A 09/14/2014   Procedure: LEFT HEART CATHETERIZATION WITH  CORONARY ANGIOGRAM;  Surgeon: Kathleene Hazel, MD;  Location: [facility] CATH LAB;  Service: Cardiovascular;  Laterality: N/A;   MASS EXCISION N/A 03/30/2020   Procedure: EXCISION MASS RIGHT LABIA MINORA;  Surgeon: Carver Fila, MD;  [facility];  Service: Gynecology;  Laterality: N/A;   PERCUTANEOUS CORONARY STENT INTERVENTION (PCI-S)  09/14/2014   Procedure: PERCUTANEOUS CORONARY STENT INTERVENTION (PCI-S);  Surgeon: Kathleene Hazel, MD;  Location: [facility] CATH LAB;  Service: Cardiovascular;;   PERIPHERAL VASCULAR INTERVENTION  08/11/2021   Procedure: PERIPHERAL VASCULAR INTERVENTION;  Surgeon: Runell Gess, MD;  [facility];  Service: Cardiovascular;;   ROBOTIC ASSISTED SALPINGO OOPHERECTOMY Bilateral 03/30/2020   Procedure: XI ROBOTIC ASSISTED SALPINGO OOPHORECTOMY;  Surgeon: Carver Fila, MD;  Location: [facility];  Service: Gynecology;  Laterality: Bilateral;   TONSILLECTOMY  2005   UMBILICAL HERNIA REPAIR  2001; 2005    Allergies  Allergen Reactions   Pork-Derived Products Other (See Comments)    Does NOT eat pork    Objective/Physical Exam Neurovascular status intact.  The incision site along the dorsum of the toe is slightly inverted.  Overall it appears that it has healed routinely with complete reepithelialization but there is some inversion at the incision site.  No infection.  No edema.  Percutaneous fixation pin is intact and stable  Radiographic Exam LT foot 03/19/2023:  Essentially unchanged from prior x-rays.  Good alignment of the second toe.  Percutaneous fixation pin intact.  Assessment: 1. s/p hammertoe arthroplasty second digit left. DOS: 02/08/2023  -Patient evaluated.  - Percutaneous fixation pin removed today. -Patient may now transition out of the postsurgical shoe into good supportive tennis shoes and sneakers -Slowly increase activity -Return to clinic as needed  Felecia Shelling, DPM [facility]  Dr. Felecia Shelling, DPM    [address] [place], [place] [address]                Office [phone]  Fax [phone]

## 2023-04-10 ENCOUNTER — Other Ambulatory Visit: Payer: Self-pay | Admitting: Podiatry

## 2023-05-09 ENCOUNTER — Ambulatory Visit: Payer: 59 | Admitting: Neurology

## 2023-05-22 ENCOUNTER — Other Ambulatory Visit: Payer: Self-pay | Admitting: Neurology

## 2023-05-22 NOTE — Telephone Encounter (Signed)
Last seen on 11/08/22 No follow up scheduled  Rx last filled on 04/17/23 #60 tablets (30 day supply) Rx pending to be signed

## 2023-05-24 ENCOUNTER — Other Ambulatory Visit: Payer: Self-pay | Admitting: Neurology

## 2023-05-24 NOTE — Telephone Encounter (Signed)
Last seen on 10/3122 No follow up scheduled   Pt needs 6 month follow up

## 2023-06-26 ENCOUNTER — Other Ambulatory Visit: Payer: Self-pay | Admitting: Neurology

## 2023-06-27 NOTE — Telephone Encounter (Signed)
Last seen on 11/08/22 No follow up scheduled

## 2023-07-03 DIAGNOSIS — Z0289 Encounter for other administrative examinations: Secondary | ICD-10-CM

## 2023-07-06 ENCOUNTER — Ambulatory Visit (HOSPITAL_COMMUNITY)
Admission: RE | Admit: 2023-07-06 | Discharge: 2023-07-06 | Disposition: A | Discharge status: Home / Self Care | Payer: 59 | Source: Ambulatory Visit | Attending: Internal Medicine | Admitting: Internal Medicine

## 2023-07-06 DIAGNOSIS — I701 Atherosclerosis of renal artery: Secondary | ICD-10-CM | POA: Diagnosis present

## 2023-07-06 DIAGNOSIS — N2889 Other specified disorders of kidney and ureter: Secondary | ICD-10-CM | POA: Insufficient documentation

## 2023-07-06 DIAGNOSIS — I739 Peripheral vascular disease, unspecified: Secondary | ICD-10-CM | POA: Insufficient documentation

## 2023-07-23 ENCOUNTER — Other Ambulatory Visit: Payer: Self-pay | Admitting: Neurology

## 2023-07-23 NOTE — Telephone Encounter (Signed)
Last seen on 11/08/22 No follow up scheduled

## 2023-07-30 ENCOUNTER — Other Ambulatory Visit: Payer: Self-pay | Admitting: Hematology and Oncology

## 2023-07-30 ENCOUNTER — Other Ambulatory Visit: Payer: Self-pay | Admitting: Neurology

## 2023-07-30 NOTE — Telephone Encounter (Signed)
Last seen on 11/08/22 No follow up schedule

## 2023-08-03 ENCOUNTER — Other Ambulatory Visit: Payer: Self-pay | Admitting: Internal Medicine

## 2023-08-03 DIAGNOSIS — E785 Hyperlipidemia, unspecified: Secondary | ICD-10-CM

## 2023-08-15 ENCOUNTER — Encounter: Payer: Self-pay | Admitting: Internal Medicine

## 2023-08-15 ENCOUNTER — Ambulatory Visit: Payer: 59 | Attending: Internal Medicine | Admitting: Internal Medicine

## 2023-08-15 VITALS — BP 160/90 | Ht 61.5 in | Wt 132.0 lb

## 2023-08-15 DIAGNOSIS — E7841 Elevated Lipoprotein(a): Secondary | ICD-10-CM

## 2023-08-15 DIAGNOSIS — E7849 Other hyperlipidemia: Secondary | ICD-10-CM

## 2023-08-15 DIAGNOSIS — I2510 Atherosclerotic heart disease of native coronary artery without angina pectoris: Secondary | ICD-10-CM

## 2023-08-15 DIAGNOSIS — I739 Peripheral vascular disease, unspecified: Secondary | ICD-10-CM

## 2023-08-15 DIAGNOSIS — I10 Essential (primary) hypertension: Secondary | ICD-10-CM | POA: Diagnosis not present

## 2023-08-15 DIAGNOSIS — E785 Hyperlipidemia, unspecified: Secondary | ICD-10-CM

## 2023-08-15 DIAGNOSIS — I701 Atherosclerosis of renal artery: Secondary | ICD-10-CM

## 2023-08-15 MED ORDER — SPIRONOLACTONE 50 MG PO TABS: 50.0000 mg | ORAL_TABLET | Freq: Every day | ORAL | 3 refills | Status: DC

## 2023-08-15 NOTE — Progress Notes (Signed)
Virtual Visit via Video Note   Because of Anna Henson's co-morbid illnesses, she is at least at moderate risk for complications without adequate follow up.  This format is felt to be most appropriate for this patient at this time.  All issues noted in this document were discussed and addressed.  A limited physical exam was performed with this format.  Please refer to the patient's chart for her consent to telehealth for [facility].      Date:  08/15/2023   ID:  Anna Henson, DOB 02-17-1971, MRN [id number] The patient was identified using 2 identifiers.  Evaluation Performed:  Follow-Up Visit  Patient Location:  [address] [place] [address]  Provider location:   [address], [address] [place], [place] [address]  PCP:  Patient, No Pcp Per  Cardiologist:  Chrystie Nose, MD Electrophysiologist:  None   Chief Complaint:  Follow-up  History of Present Illness:    Anna Henson is a 52 y.o. female who presents via audio/video conferencing for a telehealth visit today.  Anna Henson is seen today for virtual follow-up.  She has had some concern about her blood pressure being elevated.  Specifically today was 160/90.  She was previously on spironolactone in addition to lisinopril and amlodipine but for some reason that fell off her list.  She was on 50 mg daily.  She also tells me that she stopped taking her Repatha due to cost issues.  This was added to atorvastatin because of persistently elevated cholesterol and a high LP(a) at 245.  She reports no issues with PAD and recently saw Dr. Allyson Sabal.  She had stable renal Dopplers.  Denies any chest pain or worsening shortness of breath with exertion.  She had recent hammertoe surgery which she is recovering from.  Prior CV studies:   The following studies were reviewed today:  Labwork, chart reviewed  PMHx:  Past Medical History:  Diagnosis Date   Anemia    Anxiety    Bipolar disorder in full  remission (HCC)    CAD in native artery cardiologist--  dr Brennyn Haisley   a. 09/2014 Cath/PCI in setting of [place] - s/p 2.25 x 12 mm Promus Premier DES to mid RCA;  b. 11/2016 Myoview: EF 56%, no ischemia;  c. 12/2016 NSTEMI/PCI: LM nl, LAd 25p, LCX 30p, RCA 100p (2.75x38 Promus Premier DES), mRCA 10 ISR, EF 50-55%.   CKD (chronic kidney disease), stage III (HCC)    Depression    Genetic testing 04/23/2017   Ms. Gehret underwent genetic counseling and testing for hereditary cancer syndromes on 04/05/2017. Her results were negative for mutations in all 46 genes analyzed by [facility]'s 46-gene Common Hereditary Cancers Panel. Genes analyzed include: APC, ATM, AXIN2, BARD1, BMPR1A, BRCA1, BRCA2, BRIP1, CDH1, CDKN2A, CHEK2, CTNNA1, DICER1, EPCAM, GREM1, HOXB13, KIT, MEN1, MLH1, MSH2, MSH3, MSH6, MUTYH, NBN,   GERD (gastroesophageal reflux disease)    Headache    History of external beam radiation therapy    right breast 07-27-2017  to 09-25-2017   History of non-ST elevation myocardial infarction (NSTEMI)    HOCM (hypertrophic obstructive cardiomyopathy) (HCC) followed by cardiology   a. 12/2016 Echo: EF 65-70%,  mod conc LVH, dynamic obstruction @ rest, peak velocity of 291 cm/sec w/ peak gradient of , no rwma, Gr1 DD, triv TR, PASP .   Hyperlipidemia    Hypertension    Malignant neoplasm of lower-outer quadrant of right breast of female, estrogen receptor positive [facility]) oncologist--- dr Pamelia Hoit  dx 06/ 2018--- right breast invasive lobular carcinoma, ductal carcinoma w/ LCIS---- 06-21-2017 s/p right breast lumpecotmy w/ sln disseciton;   completed radiation 09-25-2017;  on tamoxifen   Myocardial infarction 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[facility]) neurologist--- dr Epimenio Foot   01/ 2017  dx transverse myelitis w/ right side numbness (09-01-2019  currently lower extremity  weakness, mucsle spasms, and gait disturbance)   Wears glasses     Past Surgical History:  Procedure Laterality Date   ABDOMINAL AORTOGRAM W/LOWER EXTREMITY N/A 08/11/2021   Procedure: ABDOMINAL AORTOGRAM W/LOWER EXTREMITY;  Surgeon: Runell Gess, MD;  Location: [facility] INVASIVE CV LAB;  Service: Cardiovascular;  Laterality: N/A;   BREAST LUMPECTOMY WITH RADIOACTIVE SEED AND SENTINEL LYMPH NODE BIOPSY Right 06/21/2017   Procedure: RIGHT BREAST BRACKETED SEED GUIDED LUMPECTOMY AND SENTINEL LYMPH NODE BIOPSY;  Surgeon: Griselda Miner, MD;  Location: [facility] OR;  Service: General;  Laterality: Right;   CORONARY/GRAFT ACUTE MI REVASCULARIZATION N/A 12/31/2016   Procedure: Coronary/Graft Acute MI Revascularization;  Surgeon: Tonny Bollman, MD;  [facility] INVASIVE CV LAB;  Service: Cardiovascular;  Laterality: N/A;   DILATATION & CURETTAGE/HYSTEROSCOPY WITH MYOSURE N/A 09/02/2019   Procedure: DILATATION & CURETTAGE/HYSTEROSCOPY WITH MYOSURE;  Surgeon: Noland Fordyce, MD;  Location: [facility];  Service: Gynecology;  Laterality: N/A;   HERNIA REPAIR     LEFT HEART CATH AND CORONARY ANGIOGRAPHY N/A 12/31/2016   Procedure: Left Heart Cath and Coronary Angiography;  Surgeon: Tonny Bollman, MD;  Location: [facility] INVASIVE CV LAB;  Service: Cardiovascular;  Laterality: N/A;   LEFT HEART CATHETERIZATION WITH CORONARY ANGIOGRAM N/A 09/14/2014   Procedure: LEFT HEART CATHETERIZATION WITH CORONARY ANGIOGRAM;  Surgeon: Kathleene Hazel, MD;  Location: [facility] CATH LAB;  Service: Cardiovascular;  Laterality: N/A;   MASS EXCISION N/A 03/30/2020   Procedure: EXCISION MASS RIGHT LABIA MINORA;  Surgeon: Carver Fila, MD;  Location: [facility] ORS;  Service: Gynecology;  Laterality: N/A;   PERCUTANEOUS CORONARY STENT INTERVENTION (PCI-S)  09/14/2014   Procedure: PERCUTANEOUS CORONARY STENT INTERVENTION (PCI-S);  Surgeon: Kathleene Hazel, MD;  Location: [facility] CATH LAB;  Service: Cardiovascular;;   PERIPHERAL  VASCULAR INTERVENTION  08/11/2021   Procedure: PERIPHERAL VASCULAR INTERVENTION;  Surgeon: Runell Gess, MD;  Location: [facility] INVASIVE CV LAB;  Service: Cardiovascular;;   ROBOTIC ASSISTED SALPINGO OOPHERECTOMY Bilateral 03/30/2020   Procedure: XI ROBOTIC ASSISTED SALPINGO OOPHORECTOMY;  Surgeon: Carver Fila, MD;  Location: [facility] ORS;  Service: Gynecology;  Laterality: Bilateral;   TONSILLECTOMY  2005   UMBILICAL HERNIA REPAIR  2001; 2005    FAMHx:  Family History  Problem Relation Age of Onset   Diabetes Mother    Heart disease Mother    Hyperlipidemia Mother    Hypertension Mother    Breast cancer Mother    Lung cancer Father    Drug abuse Father    Brain cancer Father 58   Bone cancer Father 42   Drug abuse Sister    Mental illness Brother    Mental illness Maternal Grandmother    Stomach cancer Paternal Grandmother 74       d.75    SOCHx:   reports that she quit smoking about 7 years ago. Her smoking use included cigarettes. She started smoking about 37 years ago. She has a 15 pack-year smoking history. She  has never used smokeless tobacco. She reports that she does not currently use alcohol after a past usage of about 2.0 standard drinks of alcohol per week. She reports that she does not use drugs.  ALLERGIES:  Allergies  Allergen Reactions   Pork-Derived Products Other (See Comments)    Does NOT eat pork    MEDS:  Current Meds  Medication Sig   amLODipine (NORVASC) 10 MG tablet TAKE 1 TABLET BY MOUTH EVERY DAY   anastrozole (ARIMIDEX) 1 MG tablet TAKE 1 TABLET(1 MG) BY MOUTH DAILY   aspirin 81 MG chewable tablet Chew 1 tablet (81 mg total) by mouth daily.   atorvastatin (LIPITOR) 80 MG tablet TAKE 1 TABLET BY MOUTH EVERY DAY   baclofen (LIORESAL) 10 MG tablet TAKE 1 TABLET BY MOUTH 4 TIMES DAILY   clopidogrel (PLAVIX) 75 MG tablet Take 1 tablet (75 mg total) by mouth daily with breakfast.   Evolocumab (REPATHA SURECLICK) 140 MG/ML SOAJ Inject 1 Dose into the  skin every 14 (fourteen) days.   fluticasone (FLONASE) 50 MCG/ACT nasal spray Place 1-2 sprays into both nostrils daily as needed for allergies or rhinitis.   furosemide (LASIX) 20 MG tablet TAKE 1 TABLET EVERY DAY FOR FLUID RETENTION   gabapentin (NEURONTIN) 300 MG capsule TAKE 3 CAPSULES(900 MG) BY MOUTH THREE TIMES DAILY   Isopropyl Alcohol (SWIMMERS EAR DROPS OT) Place 2 drops into both ears daily as needed (water in ears).   lisinopril (ZESTRIL) 20 MG tablet TAKE 1 TABLET BY MOUTH TWICE DAILY   meloxicam (MOBIC) 15 MG tablet TAKE 1 TABLET(15 MG) BY MOUTH DAILY   methocarbamol (ROBAXIN) 500 MG tablet TAKE 1 TABLET(500 MG) BY MOUTH THREE TIMES DAILY AS NEEDED FOR MUSCLE SPASMS   nitroGLYCERIN (NITROSTAT) 0.4 MG SL tablet PLACE 1 TABLET UNDER THE TONGUE EVERY 5 MINUTES AS NEEDED FOR CHEST PAIN   Oxcarbazepine (TRILEPTAL) 600 MG tablet TAKE 1 TABLET THREE TIMES A DAY   oxyCODONE-acetaminophen (PERCOCET) 5-325 MG tablet Take 1 tablet by mouth every 4 (four) hours as needed for severe pain.   Probiotic Product (PROBIOTIC-10 PO) Take 1 capsule by mouth daily.   spironolactone (ALDACTONE) 50 MG tablet Take 1 tablet each morning and 1/2 tablet each evening.   traMADol (ULTRAM) 50 MG tablet TAKE 1 TABLET(50 MG) BY MOUTH TWICE DAILY AS NEEDED   venlafaxine XR (EFFEXOR-XR) 75 MG 24 hr capsule TAKE 1 CAPSULE BY MOUTH DAILY WITH BREAKFAST     ROS: Pertinent items noted in HPI and remainder of comprehensive ROS otherwise negative.  Labs/Other Tests and Data Reviewed:    Recent Labs: 11/08/2022: ALT 25; BUN 12; Creatinine, Ser 0.94; Hemoglobin 13.8; Platelets 355; Potassium 4.2; Sodium 143   Recent Lipid Panel Lab Results  Component Value Date/Time   CHOL 187 01/20/2022 10:44 AM   TRIG 120 01/20/2022 10:44 AM   HDL 41 01/20/2022 10:44 AM   CHOLHDL 4.6 (H) 01/20/2022 10:44 AM   CHOLHDL 7.8 10/09/2020 03:50 AM   LDLCALC 124 (H) 01/20/2022 10:44 AM    Wt Readings from Last 3 Encounters:   08/15/23 132 lb (59.9 kg)  12/25/22 129 lb 1.6 oz (58.6 kg)  11/08/22 126 lb 8 oz (57.4 kg)     Exam:    Vital Signs:  BP (!) 160/90   Ht 5' 1.5" (1.562 m)   Wt 132 lb (59.9 kg)   LMP 03/06/2020   BMI 24.54 kg/m    General appearance: alert and no distress Lungs: no respiratory difficulty Abdomen:  normal weight Extremities: extremities normal, atraumatic, no cyanosis or edema Neurologic: Grossly normal  ASSESSMENT & PLAN:    History of NSTEMI - s/p repeat PCI to the RCA 12/2016 - DES PAD-status post peripheral intervention Coronary artery disease status post PCI to the RCA in 2015 Dyslipidemia-probable familial hyperlipidemia, goal LDL less than 55 Hypertension DOE Breast cancer  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  She had run out of her spironolactone and for some reason it was not represcribed.  Will restart that at 50 mg daily today continue amlodipine and lisinopril.  I asked her to follow blood pressures at home and we can make adjustments accordingly.  She unfortunately had to stop her Repatha due to cost issues.  Will investigate possible options for that but continue atorvastatin.  She has a high LP(a) but declined participation in the ocean a LP(a) lowering study.  Will repeat lipids and a metabolic profile about 2 weeks after restarting her Aldactone.  Plan follow-up with me in 6 months or sooner as necessary.   Patient Risk:   After full review of this patients clinical status, I feel that they are at least moderate risk at this time.  Time:   Today, I have spent 25 minutes with the patient with telehealth technology discussing dyslipidemia, hypertension, CAD, PAD.     Medication Adjustments/Labs and Tests Ordered: Current medicines are reviewed at length with the patient today.  Concerns regarding medicines are outlined above.   Tests Ordered: No orders of the defined types were  placed in this encounter.   Medication Changes: No orders of the defined types were placed in this encounter.   Disposition:  in 6 month(s)  Chrystie Nose, MD, [facility], FACP    [facility]  Medical Director of the Advanced Lipid Disorders &  Cardiovascular Risk Reduction Clinic Diplomate of the American Board of Clinical Lipidology Attending Cardiologist  Direct Dial: [phone]  Fax: [phone]  Website:  [email or web address]  Chrystie Nose, MD  08/15/2023 8:17 AM

## 2023-08-15 NOTE — Patient Instructions (Addendum)
Medication Instructions:  RESUME spironolactone 50mg  once daily RESUME Repatha is able, pending cost Co-pay card: [email or web address]  *If you need a refill on your cardiac medications before your next appointment, please call your pharmacy*   Lab Work: FASTING lab work in 2 weeks to check BMET and lipid panel   If you have labs (blood work) drawn today and your tests are completely normal, you will receive your results only by: MyChart Message (if you have MyChart) OR A paper copy in the mail If you have any lab test that is abnormal or we need to change your treatment, we will call you to review the results.   Follow-Up: At [facility], you and your health needs are our priority.  As part of our continuing mission to provide you with exceptional heart care, we have created designated Provider Care Teams.  These Care Teams include your primary Cardiologist (physician) and Advanced Practice Providers (APPs -  Physician Assistants and Nurse Practitioners) who all work together to provide you with the care you need, when you need it.  We recommend signing up for the patient portal called "MyChart".  Sign up information is provided on this After Visit Summary.  MyChart is used to connect with patients for Virtual Visits (Telemedicine).  Patients are able to view lab/test results, encounter notes, upcoming appointments, etc.  Non-urgent messages can be sent to your provider as well.   To learn more about what you can do with MyChart, go to [email or web address].    Your next appointment:   6 months with Dr. Rennis Golden  ** call in January for a May 2025 appointment

## 2023-08-21 ENCOUNTER — Ambulatory Visit: Payer: 59 | Attending: Cardiovascular Disease | Admitting: Cardiovascular Disease

## 2023-08-22 ENCOUNTER — Encounter: Payer: Self-pay | Admitting: Cardiovascular Disease

## 2023-08-31 ENCOUNTER — Other Ambulatory Visit: Payer: Self-pay | Admitting: Neurology

## 2023-09-03 NOTE — Telephone Encounter (Signed)
Last seen on 11/08/22 No follow up scheduled

## 2023-09-14 ENCOUNTER — Other Ambulatory Visit: Payer: Self-pay | Admitting: Neurology

## 2023-09-16 ENCOUNTER — Other Ambulatory Visit: Payer: Self-pay | Admitting: Neurology

## 2023-09-17 NOTE — Telephone Encounter (Signed)
Last seen on 11/08/22 No follow up scheduled

## 2023-09-17 NOTE — Telephone Encounter (Signed)
Pt is wanting to know why this was denied. Pt scheduled a yr f/u and is needing a refill called in for her. Please advise.

## 2023-09-18 MED ORDER — METHOCARBAMOL 500 MG PO TABS: ORAL_TABLET | ORAL | 1 refills | Status: DC

## 2023-09-18 NOTE — Telephone Encounter (Signed)
Patient scheduled on 01/17/23 Rx refilled

## 2023-10-01 ENCOUNTER — Other Ambulatory Visit: Payer: Self-pay | Admitting: Neurology

## 2023-10-01 ENCOUNTER — Encounter: Payer: Self-pay | Admitting: Neurology

## 2023-10-01 MED ORDER — GABAPENTIN 300 MG PO CAPS: ORAL_CAPSULE | ORAL | 0 refills | Status: DC

## 2023-10-01 NOTE — Telephone Encounter (Signed)
Rx refilled.

## 2023-10-06 ENCOUNTER — Other Ambulatory Visit: Payer: Self-pay | Admitting: Cardiovascular Disease

## 2023-10-08 ENCOUNTER — Other Ambulatory Visit: Payer: Self-pay | Admitting: Cardiovascular Disease

## 2023-10-30 LAB — BASIC METABOLIC PANEL
BUN/Creatinine Ratio: 14 (ref 9–23)
BUN: 14 mg/dL (ref 6–24)
CO2: 22 mmol/L (ref 20–29)
Calcium: 10 mg/dL (ref 8.7–10.2)
Chloride: 104 mmol/L (ref 96–106)
Creatinine, Ser: 1.01 mg/dL — ABNORMAL HIGH (ref 0.57–1.00)
Glucose: 74 mg/dL (ref 70–99)
Potassium: 4.5 mmol/L (ref 3.5–5.2)
Sodium: 141 mmol/L (ref 134–144)
eGFR: 67 mL/min/{1.73_m2} (ref >59)

## 2023-10-30 LAB — LIPID PANEL
Chol/HDL Ratio: 6.6 {ratio} — ABNORMAL HIGH (ref 0.0–4.4)
Cholesterol, Total: 285 mg/dL — ABNORMAL HIGH (ref 100–199)
HDL: 43 mg/dL (ref >39)
LDL Chol Calc (NIH): 220 mg/dL — ABNORMAL HIGH (ref 0–99)
Triglycerides: 122 mg/dL (ref 0–149)
VLDL Cholesterol Cal: 22 mg/dL (ref 5–40)

## 2023-11-05 ENCOUNTER — Other Ambulatory Visit: Payer: Self-pay | Admitting: Cardiovascular Disease

## 2023-11-05 ENCOUNTER — Other Ambulatory Visit: Payer: Self-pay | Admitting: Internal Medicine

## 2023-11-05 DIAGNOSIS — E785 Hyperlipidemia, unspecified: Secondary | ICD-10-CM

## 2023-11-05 MED ORDER — ATORVASTATIN CALCIUM 80 MG PO TABS: 80.0000 mg | ORAL_TABLET | Freq: Every day | ORAL | 3 refills | Status: DC

## 2023-11-05 MED ORDER — LISINOPRIL 20 MG PO TABS: 20.0000 mg | ORAL_TABLET | Freq: Two times a day (BID) | ORAL | 3 refills | Status: DC

## 2023-11-05 MED ORDER — REPATHA SURECLICK 140 MG/ML ~~LOC~~ SOAJ: 140.0000 mg | SUBCUTANEOUS | 5 refills | Status: DC

## 2023-11-13 ENCOUNTER — Other Ambulatory Visit: Payer: Self-pay | Admitting: Neurology

## 2023-11-15 ENCOUNTER — Other Ambulatory Visit: Payer: Self-pay | Admitting: Neurology

## 2023-11-15 ENCOUNTER — Encounter: Payer: Self-pay | Admitting: Neurology

## 2023-11-19 ENCOUNTER — Other Ambulatory Visit: Payer: Self-pay | Admitting: Internal Medicine

## 2023-11-28 ENCOUNTER — Encounter: Payer: Self-pay | Admitting: Neurology

## 2023-11-30 ENCOUNTER — Encounter: Payer: Self-pay | Admitting: Neurology

## 2023-12-05 DIAGNOSIS — Z0289 Encounter for other administrative examinations: Secondary | ICD-10-CM

## 2023-12-12 ENCOUNTER — Encounter: Payer: Self-pay | Admitting: Neurology

## 2023-12-12 ENCOUNTER — Ambulatory Visit (INDEPENDENT_AMBULATORY_CARE_PROVIDER_SITE_OTHER): Admitting: Neurology

## 2023-12-12 VITALS — BP 187/103 | HR 87 | Ht 61.5 in | Wt 135.4 lb

## 2023-12-12 DIAGNOSIS — G373 Acute transverse myelitis in demyelinating disease of central nervous system: Secondary | ICD-10-CM

## 2023-12-12 DIAGNOSIS — Z79899 Other long term (current) drug therapy: Secondary | ICD-10-CM | POA: Diagnosis not present

## 2023-12-12 DIAGNOSIS — R269 Unspecified abnormalities of gait and mobility: Secondary | ICD-10-CM

## 2023-12-12 DIAGNOSIS — M5412 Radiculopathy, cervical region: Secondary | ICD-10-CM

## 2023-12-12 DIAGNOSIS — M4802 Spinal stenosis, cervical region: Secondary | ICD-10-CM

## 2023-12-12 DIAGNOSIS — R208 Other disturbances of skin sensation: Secondary | ICD-10-CM | POA: Diagnosis not present

## 2023-12-12 MED ORDER — OXCARBAZEPINE 600 MG PO TABS: 600.0000 mg | ORAL_TABLET | Freq: Three times a day (TID) | ORAL | 3 refills | Status: DC

## 2023-12-12 MED ORDER — TRAMADOL HCL 50 MG PO TABS: ORAL_TABLET | ORAL | 5 refills | Status: DC

## 2023-12-12 MED ORDER — GABAPENTIN 300 MG PO CAPS: ORAL_CAPSULE | ORAL | 5 refills | Status: DC

## 2023-12-12 NOTE — Progress Notes (Signed)
 [facility]  PATIENT: Anna Henson DOB: 1971/08/07  REFERRING DOCTOR OR PCP:  Hamilton Capri _________________________________   HISTORICAL  CHIEF COMPLAINT:  Chief Complaint  Patient presents with   Follow-up    RM 10, alone. Last seen 11/08/22. Ambulates with Rolator walker. She has had 1 fall since last visit. No significant injuries.  Having increased headaches. 2 per week. Has to lay down with severe ones. Pain located over top of ears and behind her neck.      HISTORY OF PRESENT ILLNESS:  Anna Henson is a 53 yo woman with a transverse myelitis in late January 2017.    Follow-Up 12/12/2023 Her BP is elevated - she has not yet taken her am med's.    She will take these when she gets home and recheck her BP.  Dysesthesias are doing abut the same.   Pain is  in her neck and arm that fluctuate.     These are electric like or burning sensations radiating down her spine and into the ribs when more severe. Right side is worse than left.    Medications have not helped much..  Pain is worse when the neck is touched.  Pain is sometimes better with a heating pad.   She notes numbness is worse n her hands, left > right.  MRI 12/2019 showed resolution of the cervical spine lesion seen previously (we discussed that despite no lesion now on MRI a scar could still be left behind).    She takes gabapentin 900 mg po tid , oxcarbazapine 600-600-600 mg , Tramadol 50 mg po bid, baclofen 10 mg 3-4 times a day.  These medications help tingling pain some. She feels worse than last year.   Lamotrigine had not helped.  The tramadol makes her sleepy so she usually just takes 1 a day  She has reduced gait due to right foot drop as sequela as transverse myelitis in the past.  She uses the Rollator in  the homes if going more than a few feet.but needs it outdoors.  Generally she can go   100 feet (but usually with a rest) with her walker.   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  Also has spinal stenosis at [facility].     She had an ESI (Dr. Lockie Pares).   The procedure was very painful and she did not note a benefit.   She prefers not to do surgery.   She saw Dr. Wynetta Emery 07/2022 who felt she could be a candidate for C5-C7 ACDF but that it was not absolutely necessary.  She is concerned about the risks and that it may no help many of her symptoms.     IMAGING December 24, 2019 MRI of the cervical spine is normal (MRI February 2017 has shown T2 hyperintensity from the medulla to the mid cervical spine) she does.  She does have degenerative changes.  There is moderate spinal stenosis at C5-C6 with moderate right foraminal narrowing.  At C6-C7, she has left-sided disc osteophyte complex that could affect the left C7 nerve root.  December 24, 2019 MRI of the thoracic spine showed a normal spinal cord and no significant degenerative changes.  Transverse myelitis history: Transverse myelitis history:    In late January 2017,  she had a right sided headache and went to bed.  When she woke up, she noted numbness  in the right neck, shoulder and arm.   Numbness worsened over the next fe days.   She went to her PCP an was told she might have sinusitis and Amoxicillin was prescribed.   After a couple more days, she saw another doctor and had a neck soft tissue CT scan.   That CT scan showed that she had an increase of adenoid tissue and reactive type lymph nodes.  By that time, numbness extended to the fingertips on the riht but not into her leg.   She was told to go to the emergency room for further evaluation. In the emergency room she had an MRI of the cervical spine and the brain. The MRI of the cervical spine showed a focus that extended from the lower medulla on the right down to C5 posteriorly and more medially within the spinal cord. She also had some enhancement around the level of the cervical medullary junction. She  was admitted to the hospital for further treatment and testing.    She was treated with 5 days of IV Solu-Medrol. During that time, her symptoms of numbness improved.  She has not returned to baseline.    She had many tests performed including lumbar puncture for CSF analysis and a thoracic spine MRI and serology blood tests. For the most part, the evaluation was negative.  The 02/02/16 cervical spine shows resolution of the prior enhancing focus that was seen at the cervicomedullary junction.    Data:     MRI  January 2017 has a longitudinal lesion extending from the lower medulla on the right down to C5/C6 on  The enhancement does not extend up to the medulla.  Cervical spine also showed mild spinal stenosis at C3-C4 and mild to moderate spinal stenosis at C5-C6 and mild spinal stenosis at C6-C7. There is some foraminal narrowing at those levels but no definite nerve root compression.  Elsewhere the brain, there were some subcortical and deep white matter T2/FLAIR hyperintense foci but not in a pattern that would be typical for MS. The thoracic spine was normal.   The 02/02/16 cervical spine MRI shows resolution of the prior enhancing focus that was seen at the cervicomedullary/upper cervical spine  .    December 24, 2019 MRI of the cervical spine is normal (MRI February 2017 has shown T2 hyperintensity from the medulla to the mid cervical spine) she does.  She does have degenerative changes.  There is moderate spinal stenosis at C5-C6 with moderate right foraminal narrowing.  At C6-C7, she has left-sided disc osteophyte complex that could affect the left C7 nerve root.  December 24, 2019 MRI of the thoracic spine showed a normal spinal cord and no significant degenerative changes.  2017 laboratory tests n. Neuromyelitis optica antibodies are negative. She did not have oligoclonal bands. ANCA, ANA, SSA/SSB, ACE and Vit B-12 were all normal or negative.  She had Epstein-Barr IgG but not IgM. CSF cell counts did  not show increase in white blood cells.  REVIEW OF SYSTEMS: Constitutional: No fevers, chills, sweats, or change in appetite.  She notes some fatigue Eyes: No visual changes, double vision, eye pain Ear, nose and throat: No hearing loss, ear pain, nasal congestion, sore throat Cardiovascular: No chest pain, palpitations.   H/O angina, CAD with stent Respiratory:  No shortness of breath at rest or with exertion.   No wheezes GastrointestinaI: No nausea, vomiting, diarrhea, abdominal pain, fecal incontinence Genitourinary:  Mild urgency and frequency.  No nocturia. Musculoskeletal:  No  neck pain, back pain Integumentary: No rash, pruritus, skin lesions Neurological: as above Psychiatric: No depression at this time.  No anxiety Endocrine: No palpitations, diaphoresis, change in appetite, change in weigh or increased thirst Hematologic/Lymphatic:  No anemia, purpura, petechiae. Allergic/Immunologic: No itchy/runny eyes, nasal congestion, recent allergic reactions, rashes  ALLERGIES: Allergies  Allergen Reactions   Pork-Derived Products Other (See Comments)    Does NOT eat pork     HOME MEDICATIONS:  Current Outpatient Medications:    amLODipine (NORVASC) 10 MG tablet, TAKE 1 TABLET BY MOUTH EVERY DAY, Disp: 90 tablet, Rfl: 2   anastrozole (ARIMIDEX) 1 MG tablet, TAKE 1 TABLET(1 MG) BY MOUTH DAILY, Disp: 90 tablet, Rfl: 3   aspirin 81 MG chewable tablet, Chew 1 tablet (81 mg total) by mouth daily., Disp: , Rfl:    atorvastatin (LIPITOR) 80 MG tablet, Take 1 tablet (80 mg total) by mouth daily., Disp: 90 tablet, Rfl: 3   baclofen (LIORESAL) 10 MG tablet, TAKE 1 TABLET BY MOUTH 4 TIMES DAILY, Disp: 120 tablet, Rfl: 11   Evolocumab (REPATHA SURECLICK) 140 MG/ML SOAJ, Inject 140 mg into the skin every 14 (fourteen) days., Disp: 2 mL, Rfl: 5   fluticasone (FLONASE) 50 MCG/ACT nasal spray, Place 1-2 sprays into both nostrils daily as needed for allergies or rhinitis., Disp: , Rfl:     furosemide (LASIX) 20 MG tablet, TAKE 1 TABLET EVERY DAY FOR FLUID RETENTION, Disp: 30 tablet, Rfl: 2   Isopropyl Alcohol (SWIMMERS EAR DROPS OT), Place 2 drops into both ears daily as needed (water in ears)., Disp: , Rfl:    lisinopril (ZESTRIL) 20 MG tablet, Take 1 tablet (20 mg total) by mouth 2 (two) times daily., Disp: 180 tablet, Rfl: 3   methocarbamol (ROBAXIN) 500 MG tablet, TAKE 1 TABLET(500 MG) BY MOUTH THREE TIMES DAILY AS NEEDED FOR MUSCLE SPASMS, Disp: 90 tablet, Rfl: 1   nitroGLYCERIN (NITROSTAT) 0.4 MG SL tablet, PLACE 1 TABLET UNDER THE TONGUE EVERY 5 MINUTES AS NEEDED FOR CHEST PAIN, Disp: 25 tablet, Rfl: 4   Probiotic Product (PROBIOTIC-10 PO), Take 1 capsule by mouth daily., Disp: , Rfl:    spironolactone (ALDACTONE) 50 MG tablet, Take 1 tablet (50 mg total) by mouth daily. Take 1 tablet each morning and 1/2 tablet each evening., Disp: 90 tablet, Rfl: 3   venlafaxine XR (EFFEXOR-XR) 75 MG 24 hr capsule, TAKE 1 CAPSULE BY MOUTH DAILY WITH BREAKFAST, Disp: 90 capsule, Rfl: 1   diphenhydrAMINE (BENADRYL ALLERGY) 25 MG tablet, Take 1 tablet (25 mg total) by mouth every 6 (six) hours for 3 days. (Patient not taking: Reported on 01/01/2023), Disp: 12 tablet, Rfl: 0   gabapentin (NEURONTIN) 300 MG capsule, Take 3 capsules (900 MG) by mouth three times daily, Disp: 270 capsule, Rfl: 5   oxcarbazepine (TRILEPTAL) 600 MG tablet, Take 1 tablet (600 mg total) by mouth 3 (three) times daily., Disp: 90 tablet, Rfl: 3   traMADol (ULTRAM) 50 MG tablet, TAKE 1 TABLET(50 MG) BY MOUTH UP TO THREE TIMES A DAILY AS NEEDED, Disp: 90 tablet, Rfl: 5  PAST MEDICAL HISTORY: Past Medical History:  Diagnosis Date   Anemia    Anxiety    Bipolar disorder in full remission (HCC)    CAD in native artery cardiologist--  dr hilty   a. 09/2014 Cath/PCI in setting of [place] - s/p 2.25 x 12 mm Promus Premier DES to mid RCA;  b. 11/2016 Myoview: EF 56%, no ischemia;  c. 12/2016 NSTEMI/PCI: LM nl, LAd  25p, LCX 30p, RCA  100p (2.75x38 Promus Premier DES), mRCA 10 ISR, EF 50-55%.   CKD (chronic kidney disease), stage III (HCC)    Depression    Genetic testing 04/23/2017   Ms. Sibert underwent genetic counseling and testing for hereditary cancer syndromes on 04/05/2017. Her results were negative for mutations in all 46 genes analyzed by [facility]'s 46-gene Common Hereditary Cancers Panel. Genes analyzed include: APC, ATM, AXIN2, BARD1, BMPR1A, BRCA1, BRCA2, BRIP1, CDH1, CDKN2A, CHEK2, CTNNA1, DICER1, EPCAM, GREM1, HOXB13, KIT, MEN1, MLH1, MSH2, MSH3, MSH6, MUTYH, NBN,   GERD (gastroesophageal reflux disease)    Headache    History of external beam radiation therapy    right breast 07-27-2017  to 09-25-2017   History of non-ST elevation myocardial infarction (NSTEMI)    HOCM (hypertrophic obstructive cardiomyopathy) (HCC) followed by cardiology   a. 12/2016 Echo: EF 65-70%,  mod conc LVH, dynamic obstruction @ rest, peak velocity of 291 cm/sec w/ peak gradient of , no rwma, Gr1 DD, triv TR, PASP .   Hyperlipidemia    Hypertension    Malignant neoplasm of lower-outer quadrant of right breast of female, estrogen receptor positive [facility]) oncologist--- dr Pamelia Hoit   dx 06/ 2018--- right breast invasive lobular carcinoma, ductal carcinoma w/ LCIS---- 06-21-2017 s/p right breast lumpecotmy w/ sln disseciton;   completed radiation 09-25-2017;  on tamoxifen   Myocardial infarction [facility])    2017   S/P drug eluting coronary stent placement    09-14-2014---  PCI with DES x1 to midRCA;  12-31-2016  PCI with DES x1 to proxRCA   Transverse myelitis [facility]) neurologist--- dr Epimenio Foot   01/ 2017  dx transverse myelitis w/ right side numbness (09-01-2019  currently lower extremity weakness, mucsle spasms, and gait disturbance)   Wears glasses     PAST SURGICAL HISTORY: Past Surgical History:  Procedure Laterality Date   ABDOMINAL AORTOGRAM W/LOWER EXTREMITY N/A 08/11/2021   Procedure: ABDOMINAL AORTOGRAM W/LOWER EXTREMITY;   Surgeon: Runell Gess, MD;  Location: [facility] INVASIVE CV LAB;  Service: Cardiovascular;  Laterality: N/A;   BREAST LUMPECTOMY WITH RADIOACTIVE SEED AND SENTINEL LYMPH NODE BIOPSY Right 06/21/2017   Procedure: RIGHT BREAST BRACKETED SEED GUIDED LUMPECTOMY AND SENTINEL LYMPH NODE BIOPSY;  Surgeon: Griselda Miner, MD;  Location: [facility] OR;  Service: General;  Laterality: Right;   CORONARY/GRAFT ACUTE MI REVASCULARIZATION N/A 12/31/2016   Procedure: Coronary/Graft Acute MI Revascularization;  Surgeon: Tonny Bollman, MD;  Location: [facility] INVASIVE CV LAB;  Service: Cardiovascular;  Laterality: N/A;   DILATATION & CURETTAGE/HYSTEROSCOPY WITH MYOSURE N/A 09/02/2019   Procedure: DILATATION & CURETTAGE/HYSTEROSCOPY WITH MYOSURE;  Surgeon: Noland Fordyce, MD;  Location: [facility];  Service: Gynecology;  Laterality: N/A;   HERNIA REPAIR     LEFT HEART CATH AND CORONARY ANGIOGRAPHY N/A 12/31/2016   Procedure: Left Heart Cath and Coronary Angiography;  Surgeon: Tonny Bollman, MD;  Location: [facility] INVASIVE CV LAB;  Service: Cardiovascular;  Laterality: N/A;   LEFT HEART CATHETERIZATION WITH CORONARY ANGIOGRAM N/A 09/14/2014   Procedure: LEFT HEART CATHETERIZATION WITH CORONARY ANGIOGRAM;  Surgeon: Kathleene Hazel, MD;  Location: [facility] CATH LAB;  Service: Cardiovascular;  Laterality: N/A;   MASS EXCISION N/A 03/30/2020   Procedure: EXCISION MASS RIGHT LABIA MINORA;  Surgeon: Carver Fila, MD;  Location: [facility] ORS;  Service: Gynecology;  Laterality: N/A;   PERCUTANEOUS CORONARY STENT INTERVENTION (PCI-S)  09/14/2014   Procedure: PERCUTANEOUS CORONARY STENT INTERVENTION (PCI-S);  Surgeon: Kathleene Hazel, MD;  Location: [facility] CATH LAB;  Service:  Cardiovascular;;   PERIPHERAL VASCULAR INTERVENTION  08/11/2021   Procedure: PERIPHERAL VASCULAR INTERVENTION;  Surgeon: Runell Gess, MD;  Location: [facility] INVASIVE CV LAB;  Service: Cardiovascular;;   ROBOTIC ASSISTED SALPINGO OOPHERECTOMY Bilateral  03/30/2020   Procedure: XI ROBOTIC ASSISTED SALPINGO OOPHORECTOMY;  Surgeon: Carver Fila, MD;  Location: [facility] ORS;  Service: Gynecology;  Laterality: Bilateral;   TONSILLECTOMY  2005   UMBILICAL HERNIA REPAIR  2001; 2005    FAMILY HISTORY: Family History  Problem Relation Age of Onset   Diabetes Mother    Heart disease Mother    Hyperlipidemia Mother    Hypertension Mother    Breast cancer Mother    Lung cancer Father    Drug abuse Father    Brain cancer Father 36   Bone cancer Father 24   Drug abuse Sister    Mental illness Brother    Mental illness Maternal Grandmother    Stomach cancer Paternal Grandmother 79       d.75      DIAGNOSTIC DATA (LABS, IMAGING, TESTING) - I reviewed patient records, labs, notes, testing and imaging myself where available.  Lab Results  Component Value Date   WBC 10.3 11/08/2022   HGB 13.8 11/08/2022   HCT 40.0 11/08/2022   MCV 91 11/08/2022   PLT 355 11/08/2022   +__________________________________________________________   Today's Vitals   12/12/23 1056 12/12/23 1109  BP: (!) 200/101 (!) 187/103  Pulse: 79 87  Weight: 135 lb 6.4 oz (61.4 kg)   Height: 5' 1.5" (1.562 m)    Body mass index is 25.17 kg/m.  General: The patient is well-developed and well-nourished and in no acute distress   Neurologic Exam   Mental status: The patient is alert and oriented x 3 at the time of the examination. The patient has apparent normal recent and remote memory, with an apparently normal attention span and concentration ability.   Speech is normal.   Cranial nerves: Extraocular movements are full.   Facial strength and sensation is normal. Trapezius strength is normal    No obvious hearing deficits are noted.   Motor:  Muscle bulk is normal.  Tone is increased in legs, right greater than left. Strength is 5/5 except 4+/5 right triceps.   Sensory: Reduced right C6 and C7 sensation in hand.  Touch and vibration sensation was otherwise  normal.    Gait and station: Station is normal.   Gait is spastic (right) and wide. She has a right foot drop.  She cannot tandem. She does better with Rollator  Romberg negative.     Reflexes: Deep tendon reflexes are symmetric in arms and increased in legs, right greater than left. There is spread at the knees.   One beat right ankle clonus .   ________________________________________________________   Transverse myelitis (HCC)  High risk medication use  Dysesthesia  Gait disturbance  Cervical spinal stenosis  Cervical radiculopathy   1. Continue oxcarbazepine 1800/day,  gabapentin 2700 mg/day, baclofen and tramadol.    Can increase tramadol to up to 3/day 2.  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   If new symptoms in different neurologic region, consider MRI brain to assess for MS.   3.  Stay active and exercise as tolerated 4.   rtc 6 months, sooner if new or worsening issues     Kama Cammarano A. Epimenio Foot, MD, PhD 12/12/2023, 11:25 AM Certified in Neurology, Clinical Neurophysiology, Sleep Medicine, Pain Medicine and Neuroimaging  [facility] [address], [address] [place], [place] [address] [phone]

## 2023-12-17 ENCOUNTER — Telehealth: Payer: Self-pay | Admitting: *Deleted

## 2023-12-17 NOTE — Telephone Encounter (Signed)
 Pt standard form faxed today to [phone]

## 2023-12-17 NOTE — Telephone Encounter (Signed)
 Noted.

## 2023-12-17 NOTE — Telephone Encounter (Signed)
 Spoke to patient informed her just received paperwork late Thursday . We were off Friday and will start working on paperwork today . Will have paperwork done by Thursday 12/20/2023. Advised patient to call insurance company to get an extension. Pt states will call and try . Pt hung up phone

## 2023-12-25 ENCOUNTER — Ambulatory Visit: Payer: 59 | Admitting: Neurology

## 2023-12-26 ENCOUNTER — Telehealth: Payer: Self-pay | Admitting: Hematology and Oncology

## 2023-12-26 ENCOUNTER — Encounter: Payer: Self-pay | Admitting: Hematology and Oncology

## 2023-12-26 NOTE — Telephone Encounter (Signed)
 Rescheduled appointment per 3/19 staff message. Called and left VM for the patient with the changes made to her upcoming appointment.

## 2023-12-31 ENCOUNTER — Ambulatory Visit: Payer: 59 | Admitting: Hematology and Oncology

## 2024-01-03 ENCOUNTER — Encounter: Payer: Self-pay | Admitting: *Deleted

## 2024-01-06 ENCOUNTER — Other Ambulatory Visit: Payer: Self-pay | Admitting: Hematology and Oncology

## 2024-01-06 ENCOUNTER — Ambulatory Visit

## 2024-01-06 ENCOUNTER — Ambulatory Visit
Admission: RE | Admit: 2024-01-06 | Discharge: 2024-01-06 | Disposition: A | Discharge status: Home / Self Care | Source: Ambulatory Visit | Attending: Family Medicine | Admitting: Family Medicine

## 2024-01-06 ENCOUNTER — Other Ambulatory Visit: Payer: Self-pay | Admitting: Neurology

## 2024-01-06 VITALS — BP 123/73 | HR 95 | Temp 97.8°F | Resp 17

## 2024-01-06 DIAGNOSIS — N1831 Chronic kidney disease, stage 3a: Secondary | ICD-10-CM | POA: Insufficient documentation

## 2024-01-06 DIAGNOSIS — N760 Acute vaginitis: Secondary | ICD-10-CM | POA: Diagnosis not present

## 2024-01-06 DIAGNOSIS — I5032 Chronic diastolic (congestive) heart failure: Secondary | ICD-10-CM | POA: Diagnosis not present

## 2024-01-06 DIAGNOSIS — N898 Other specified noninflammatory disorders of vagina: Secondary | ICD-10-CM

## 2024-01-06 DIAGNOSIS — L292 Pruritus vulvae: Secondary | ICD-10-CM | POA: Diagnosis present

## 2024-01-06 DIAGNOSIS — B9689 Other specified bacterial agents as the cause of diseases classified elsewhere: Secondary | ICD-10-CM | POA: Diagnosis not present

## 2024-01-06 DIAGNOSIS — I130 Hypertensive heart and chronic kidney disease with heart failure and stage 1 through stage 4 chronic kidney disease, or unspecified chronic kidney disease: Secondary | ICD-10-CM | POA: Insufficient documentation

## 2024-01-06 DIAGNOSIS — Z113 Encounter for screening for infections with a predominantly sexual mode of transmission: Secondary | ICD-10-CM | POA: Insufficient documentation

## 2024-01-06 DIAGNOSIS — Z202 Contact with and (suspected) exposure to infections with a predominantly sexual mode of transmission: Secondary | ICD-10-CM | POA: Diagnosis present

## 2024-01-06 LAB — POCT URINALYSIS DIP (MANUAL ENTRY)
Bilirubin, UA: NEGATIVE
Blood, UA: NEGATIVE
Glucose, UA: NEGATIVE mg/dL
Ketones, POC UA: NEGATIVE mg/dL
Nitrite, UA: NEGATIVE
Protein Ur, POC: NEGATIVE mg/dL
Spec Grav, UA: >=1.03 — AB (ref 1.010–1.025)
Urobilinogen, UA: 0.2 U/dL
pH, UA: 5.5 (ref 5.0–8.0)

## 2024-01-06 MED ORDER — VALACYCLOVIR HCL 1 G PO TABS: 1000.0000 mg | ORAL_TABLET | Freq: Three times a day (TID) | ORAL | 0 refills | Status: AC

## 2024-01-06 MED ORDER — FLUCONAZOLE 150 MG PO TABS: 150.0000 mg | ORAL_TABLET | ORAL | 0 refills | Status: DC

## 2024-01-06 NOTE — ED Provider Notes (Signed)
 [facility]    CSN: [id number] Arrival date & time: 01/06/24  1024      History   Chief Complaint Chief Complaint  Patient presents with   Vaginal Itching    Entered by patient    HPI Anna Henson is a 53 y.o. female.   Patient presents to urgent care for evaluation of milky/white vaginal discharge and vaginal irritation/itching that started 2 to 3 days ago.  Reports gradual discomfort has worsened significantly over the last 2 to 3 days and is rather painful.  Reports terminal dysuria and wonders if this could be a rash to the vaginal region causing pain with urination.  Denies vaginal odor, urinary frequency, flank pain, nausea, vomiting, fever, chills, abdominal pain, low back pain, and chance of pregnancy (hysterectomy in 2021).  History of HPV.  She denies history of HSV 2.  No recent antibiotic/steroid use. No known recent exposures to STDs, however she was recently sexually active with a new female partner unprotected.  She would like to be tested for STDs today.  History of vaginal yeast infections, states this feels slightly similar but much worse than normal.  She has not attempted use of any over-the-counter medications to help with symptoms.    Vaginal Itching    Past Medical History:  Diagnosis Date   Anemia    Anxiety    Bipolar disorder in full remission (HCC)    CAD in native artery cardiologist--  dr hilty   a. 09/2014 Cath/PCI in setting of [place] - s/p 2.25 x 12 mm Promus Premier DES to mid RCA;  b. 11/2016 Myoview: EF 56%, no ischemia;  c. 12/2016 NSTEMI/PCI: LM nl, LAd 25p, LCX 30p, RCA 100p (2.75x38 Promus Premier DES), mRCA 10 ISR, EF 50-55%.   CKD (chronic kidney disease), stage III (HCC)    Depression    Genetic testing 04/23/2017   Ms. Padget underwent genetic counseling and testing for hereditary cancer syndromes on 04/05/2017. Her results were negative for mutations in all 46 genes analyzed by [facility]'s 46-gene Common Hereditary Cancers Panel.  Genes analyzed include: APC, ATM, AXIN2, BARD1, BMPR1A, BRCA1, BRCA2, BRIP1, CDH1, CDKN2A, CHEK2, CTNNA1, DICER1, EPCAM, GREM1, HOXB13, KIT, MEN1, MLH1, MSH2, MSH3, MSH6, MUTYH, NBN,   GERD (gastroesophageal reflux disease)    Headache    History of external beam radiation therapy    right breast 07-27-2017  to 09-25-2017   History of non-ST elevation myocardial infarction (NSTEMI)    HOCM (hypertrophic obstructive cardiomyopathy) (HCC) followed by cardiology   a. 12/2016 Echo: EF 65-70%,  mod conc LVH, dynamic obstruction @ rest, peak velocity of 291 cm/sec w/ peak gradient of , no rwma, Gr1 DD, triv TR, PASP .   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--- dr Epimenio Foot   01/ 2017  dx transverse myelitis w/ right side numbness (09-01-2019  currently lower extremity weakness, mucsle spasms, and gait disturbance)   Wears glasses     Patient Active Problem List  Diagnosis Date Noted   Renal vascular disease 08/15/2022   Claudication in peripheral vascular disease (HCC) 08/11/2021   Bacterial vaginosis 08/10/2021   Peripheral arterial disease (HCC) 06/17/2021   Cyst of ovary 11/15/2020   History of myocardial infarction 11/15/2020   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   Malignant tumor of breast (HCC) 11/15/2020   Oligomenorrhea 11/15/2020   Uterine leiomyoma 11/15/2020   Hyponatremia 10/14/2020   Chronic diastolic CHF (congestive heart failure) (HCC) 10/08/2020    Neuropathic pain 07/06/2020   Cervical spinal stenosis 06/15/2020   Lhermitte's sign positive 06/15/2020   Labial cyst    Complex cyst of right ovary 12/05/2019   Mood disorder (HCC) 10/13/2019   Hypertensive urgency 07/28/2018   Chronic anemia    MDD (major depressive disorder), recurrent episode, severe (HCC) 08/28/2017   Neck stiffness 08/22/2017   Abnormal finding on MRI of brain 08/22/2017   HOCM (hypertrophic obstructive cardiomyopathy) (HCC)    Chest pain 05/06/2017   Genetic testing 04/23/2017   Malignant neoplasm of lower-outer quadrant of right breast of female, estrogen receptor positive (HCC) 03/29/2017   Dysesthesia 03/12/2017   Palpitations 02/23/2017   S/P cardiac cath (12/2016) a. acute total occlusion of the RCA tx w/PCI DES, b. mild nonobs LAD/LCx stenosis, c. 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   Acute-on-chronic kidney injury (HCC) 11/12/2015   Neck pain 11/12/2015   Hypokalemia 11/12/2015   Abnormal MRI, neck 11/12/2015   Numbness 11/12/2015   Stage 3a chronic kidney disease (HCC) 11/12/2015   Hypertensive crisis    Hyperlipidemia    Coronary artery disease involving native coronary artery of native heart without angina pectoris    Facial numbness    Right arm numbness    Throat pain 11/09/2015   Drooling 11/09/2015   CAD (coronary artery disease), native coronary artery 09/15/2014   Headache 09/15/2014   UTI (urinary tract infection): Probable 09/14/2014   Candida infection of genital region 09/14/2014   Unstable angina (HCC) 09/13/2014   HLD (hyperlipidemia) 09/13/2014   Otitis 09/13/2014   ACS  (acute coronary syndrome) (HCC) 09/13/2014   Tobacco abuse 09/13/2014   Essential hypertension 09/09/2014    Past Surgical History:  Procedure Laterality Date   ABDOMINAL AORTOGRAM W/LOWER EXTREMITY N/A 08/11/2021   Procedure: ABDOMINAL AORTOGRAM W/LOWER EXTREMITY;  Surgeon: Runell Gess, MD;  Location: [facility] INVASIVE CV LAB;  Service: Cardiovascular;  Laterality: N/A;   BREAST LUMPECTOMY WITH RADIOACTIVE SEED AND SENTINEL LYMPH NODE BIOPSY Right 06/21/2017   Procedure: RIGHT BREAST BRACKETED SEED GUIDED LUMPECTOMY AND SENTINEL LYMPH NODE BIOPSY;  Surgeon: Griselda Miner, MD;  Location: [facility] OR;  Service: General;  Laterality: Right;   CORONARY/GRAFT ACUTE MI REVASCULARIZATION N/A 12/31/2016   Procedure: Coronary/Graft Acute MI Revascularization;  Surgeon: Tonny Bollman, MD;  Location: [facility] INVASIVE CV LAB;  Service: Cardiovascular;  Laterality: N/A;   DILATATION & CURETTAGE/HYSTEROSCOPY WITH MYOSURE N/A 09/02/2019   Procedure: DILATATION & CURETTAGE/HYSTEROSCOPY WITH MYOSURE;  Surgeon: Noland Fordyce, MD;  Location: [facility];  Service: Gynecology;  Laterality: N/A;   HERNIA REPAIR     LEFT HEART CATH AND CORONARY ANGIOGRAPHY N/A 12/31/2016  Procedure: Left Heart Cath and Coronary Angiography;  Surgeon: Tonny Bollman, MD;  Location: [facility] INVASIVE CV LAB;  Service: Cardiovascular;  Laterality: N/A;   LEFT HEART CATHETERIZATION WITH CORONARY ANGIOGRAM N/A 09/14/2014   Procedure: LEFT HEART CATHETERIZATION WITH CORONARY ANGIOGRAM;  Surgeon: Kathleene Hazel, MD;  Location: [facility] CATH LAB;  Service: Cardiovascular;  Laterality: N/A;   MASS EXCISION N/A 03/30/2020   Procedure: EXCISION MASS RIGHT LABIA MINORA;  Surgeon: Carver Fila, MD;  Location: [facility] ORS;  Service: Gynecology;  Laterality: N/A;   PERCUTANEOUS CORONARY STENT INTERVENTION (PCI-S)  09/14/2014   Procedure: PERCUTANEOUS CORONARY STENT INTERVENTION (PCI-S);  Surgeon: Kathleene Hazel, MD;  Location: [facility] CATH  LAB;  Service: Cardiovascular;;   PERIPHERAL VASCULAR INTERVENTION  08/11/2021   Procedure: PERIPHERAL VASCULAR INTERVENTION;  Surgeon: Runell Gess, MD;  Location: [facility] INVASIVE CV LAB;  Service: Cardiovascular;;   ROBOTIC ASSISTED SALPINGO OOPHERECTOMY Bilateral 03/30/2020   Procedure: XI ROBOTIC ASSISTED SALPINGO OOPHORECTOMY;  Surgeon: Carver Fila, MD;  Location: [facility] ORS;  Service: Gynecology;  Laterality: Bilateral;   TONSILLECTOMY  2005   UMBILICAL HERNIA REPAIR  2001; 2005    OB History   No obstetric history on file.      Home Medications    Prior to Admission medications   Medication Sig Start Date End Date Taking? Authorizing Provider  fluconazole (DIFLUCAN) 150 MG tablet Take 1 tablet (150 mg total) by mouth every 3 (three) days. 01/06/24  Yes Carlisle Beers, FNP  valACYclovir (VALTREX) 1000 MG tablet Take 1 tablet (1,000 mg total) by mouth 3 (three) times daily for 7 days. 01/06/24 01/13/24 Yes Carlisle Beers, FNP  amLODipine (NORVASC) 10 MG tablet TAKE 1 TABLET BY MOUTH EVERY DAY 11/20/23   Hilty, Lisette Abu, MD  anastrozole (ARIMIDEX) 1 MG tablet TAKE 1 TABLET(1 MG) BY MOUTH DAILY 12/21/22   Serena Croissant, MD  aspirin 81 MG chewable tablet Chew 1 tablet (81 mg total) by mouth daily. 09/16/14   Rhetta Mura, MD  atorvastatin (LIPITOR) 80 MG tablet Take 1 tablet (80 mg total) by mouth daily. 11/05/23   Hilty, Lisette Abu, MD  baclofen (LIORESAL) 10 MG tablet TAKE 1 TABLET BY MOUTH 4 TIMES DAILY 09/28/22   Sater, Pearletha Furl, MD  diphenhydrAMINE (BENADRYL ALLERGY) 25 MG tablet Take 1 tablet (25 mg total) by mouth every 6 (six) hours for 3 days. Patient not taking: Reported on 01/01/2023 05/30/22 08/15/22  Theadora Rama Scales, PA-C  Evolocumab (REPATHA SURECLICK) 140 MG/ML SOAJ Inject 140 mg into the skin every 14 (fourteen) days. 11/05/23   Hilty, Lisette Abu, MD  fluticasone (FLONASE) 50 MCG/ACT nasal spray Place 1-2 sprays into both nostrils daily as needed for  allergies or rhinitis.    [provider]  furosemide (LASIX) 20 MG tablet TAKE 1 TABLET EVERY DAY FOR FLUID RETENTION 09/26/21   Hilty, Lisette Abu, MD  gabapentin (NEURONTIN) 300 MG capsule Take 3 capsules (900 MG) by mouth three times daily 12/12/23   Sater, Pearletha Furl, MD  Isopropyl Alcohol (SWIMMERS EAR DROPS OT) Place 2 drops into both ears daily as needed (water in ears).    [provider]  lisinopril (ZESTRIL) 20 MG tablet Take 1 tablet (20 mg total) by mouth 2 (two) times daily. 11/05/23   Hilty, Lisette Abu, MD  methocarbamol (ROBAXIN) 500 MG tablet TAKE 1 TABLET(500 MG) BY MOUTH THREE TIMES DAILY AS NEEDED FOR MUSCLE SPASMS 09/18/23   Sater, Pearletha Furl, MD  nitroGLYCERIN (NITROSTAT) 0.4 MG  SL tablet PLACE 1 TABLET UNDER THE TONGUE EVERY 5 MINUTES AS NEEDED FOR CHEST PAIN 07/15/21   Hilty, Lisette Abu, MD  oxcarbazepine (TRILEPTAL) 600 MG tablet Take 1 tablet (600 mg total) by mouth 3 (three) times daily. 12/12/23   Sater, Pearletha Furl, MD  Probiotic Product (PROBIOTIC-10 PO) Take 1 capsule by mouth daily.    [provider]  spironolactone (ALDACTONE) 50 MG tablet Take 1 tablet (50 mg total) by mouth daily. Take 1 tablet each morning and 1/2 tablet each evening. 08/15/23   Hilty, Lisette Abu, MD  traMADol (ULTRAM) 50 MG tablet TAKE 1 TABLET(50 MG) BY MOUTH UP TO THREE TIMES A DAILY AS NEEDED 12/12/23   Sater, Pearletha Furl, MD  venlafaxine XR (EFFEXOR-XR) 75 MG 24 hr capsule TAKE 1 CAPSULE BY MOUTH DAILY WITH BREAKFAST 07/31/23   Serena Croissant, MD    Family History Family History  Problem Relation Age of Onset   Diabetes Mother    Heart disease Mother    Hyperlipidemia Mother    Hypertension Mother    Breast cancer Mother    Lung cancer Father    Drug abuse Father    Brain cancer Father 67   Bone cancer Father 19   Drug abuse Sister    Mental illness Brother    Mental illness Maternal Grandmother    Stomach cancer Paternal Grandmother 31       d.75    Social  History Social History   Tobacco Use   Smoking status: Former    Current packs/day: 0.00    Average packs/day: 0.5 packs/day for 30.0 years (15.0 ttl pk-yrs)    Types: Cigarettes    Start date: 10/04/1985    Quit date: 10/05/2015    Years since quitting: 8.2   Smokeless tobacco: Never  Vaping Use   Vaping status: Former  Substance Use Topics   Alcohol use: Not Currently    Alcohol/week: 2.0 standard drinks of alcohol    Types: 2 Cans of beer per week    Comment: occas.   Drug use: No     Allergies   Pork-derived products   Review of Systems Review of Systems Per HPI  Physical Exam Triage Vital Signs ED Triage Vitals  Encounter Vitals Group     BP 01/06/24 1038 123/73     Systolic BP Percentile --      Diastolic BP Percentile --      Pulse Rate 01/06/24 1038 95     Resp 01/06/24 1038 17     Temp 01/06/24 1038 97.8 F (36.6 C)     Temp Source 01/06/24 1038 Oral     SpO2 01/06/24 1038 96 %     Weight --      Height --      Head Circumference --      Peak Flow --      Pain Score 01/06/24 1058 0     Pain Loc --      Pain Education --      Exclude from Growth Chart --    No data found.  Updated Vital Signs BP 123/73 (BP Location: Right Arm)   Pulse 95   Temp 97.8 F (36.6 C) (Oral)   Resp 17   LMP 03/06/2020   SpO2 96%   Visual Acuity Right Eye Distance:   Left Eye Distance:   Bilateral Distance:    Right Eye Near:   Left Eye Near:    Bilateral Near:  Physical Exam Vitals and nursing note reviewed. Exam conducted with a chaperone present Dorathy Daft, RN present for exam).  Constitutional:      Appearance: She is not ill-appearing or toxic-appearing.  HENT:     Head: Normocephalic and atraumatic.     Right Ear: Hearing and external ear normal.     Left Ear: Hearing and external ear normal.     Nose: Nose normal.     Mouth/Throat:     Lips: Pink.  Eyes:     General: Lids are normal. Vision grossly intact. Gaze aligned appropriately.      Extraocular Movements: Extraocular movements intact.     Conjunctiva/sclera: Conjunctivae normal.  Pulmonary:     Effort: Pulmonary effort is normal.  Genitourinary:    Exam position: Knee-chest position.     Pubic Area: No rash.      Labia:        Right: Rash and tenderness present.        Left: Rash and tenderness present.      Vagina: No vaginal discharge.    Musculoskeletal:     Cervical back: Neck supple.  Skin:    General: Skin is warm and dry.     Capillary Refill: Capillary refill takes less than 2 seconds.     Findings: No rash.  Neurological:     General: No focal deficit present.     Mental Status: She is alert and oriented to person, place, and time. Mental status is at baseline.     Cranial Nerves: No dysarthria or facial asymmetry.  Psychiatric:        Mood and Affect: Mood normal.        Speech: Speech normal.        Behavior: Behavior normal.        Thought Content: Thought content normal.        Judgment: Judgment normal.      UC Treatments / Results  Labs (all labs ordered are listed, but only abnormal results are displayed) Labs Reviewed  POCT URINALYSIS DIP (MANUAL ENTRY) - Abnormal; Notable for the following components:      Result Value   Spec Grav, UA >=1.030 (*)    Leukocytes, UA Small (1+) (*)    All other components within normal limits  HSV CULTURE AND TYPING  CERVICOVAGINAL ANCILLARY ONLY    EKG   Radiology No results found.  Procedures Procedures (including critical care time)  Medications Ordered in UC Medications - No data to display  Initial Impression / Assessment and Plan / UC Course  I have reviewed the triage vital signs and the nursing notes.  Pertinent labs & imaging results that were available during my care of the patient were reviewed by me and considered in my medical decision making (see chart for details).   1. 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  HSV culture  and typing sent to confirm. Empiric treatment with valtrex ordered. History of CKD3, creatinine clearance is 51 mL/min, no dosage adjustments indicated per UTD for valtrex.  Advised to notify sexual partners and abstain from intercourse until lesions are resolved.  Additionally suspect yeast vaginitis, therefore empiric treatment with diflucan ordered.  Vaginal swab pending to test for BV, yeast, and STDs. Staff will call and treat appropriately for any/all other positive infections.  She declines HIV/RPR today.  Counseled patient on potential for adverse effects with medications prescribed/recommended today, strict ER and return-to-clinic precautions discussed, patient verbalized understanding.  Final Clinical Impressions(s) / UC Diagnoses   Final diagnoses:  Vaginal discharge  Acute vaginitis  Vaginal lesion     Discharge Instructions      I'm concerned your vaginal rash and irritation may be due to genital herpes. This is sexually spread, however the virus is asleep in your body until it wakes up one day and causes a rash/infection. We treat this with valtrex every 8 hours for the next 7 days. Take this with food to avoid stomach upset. The culture and typing swab is sent to confirm genital herpes infection. If the swab is positive, you will need to tell sexual partners (new and within the last 60 days) about positive result.   I'd like to go ahead and treat for vaginal yeast infection today as well. Take diflucan once today, then again in 3 days.  Tylenol as needed for pain.  Other testing will come back in the next 2-3 days and staff will call if abnormal.  If you develop any new or worsening symptoms or if your symptoms do not start to improve, please return here or follow-up with your primary care provider. If your symptoms are severe, please go to the emergency room.     ED Prescriptions     Medication Sig Dispense Auth. Provider   valACYclovir (VALTREX) 1000 MG  tablet Take 1 tablet (1,000 mg total) by mouth 3 (three) times daily for 7 days. 21 tablet Reita May M, FNP   fluconazole (DIFLUCAN) 150 MG tablet Take 1 tablet (150 mg total) by mouth every 3 (three) days. 2 tablet Carlisle Beers, FNP      PDMP not reviewed this encounter.   Carlisle Beers, [place] 01/06/24 1133

## 2024-01-06 NOTE — Discharge Instructions (Addendum)
 I'm concerned your vaginal rash and irritation may be due to genital herpes. This is sexually spread, however the virus is asleep in your body until it wakes up one day and causes a rash/infection. We treat this with valtrex every 8 hours for the next 7 days. Take this with food to avoid stomach upset. The culture and typing swab is sent to confirm genital herpes infection. If the swab is positive, you will need to tell sexual partners (new and within the last 60 days) about positive result.   I'd like to go ahead and treat for vaginal yeast infection today as well. Take diflucan once today, then again in 3 days.  Tylenol as needed for pain.  Other testing will come back in the next 2-3 days and staff will call if abnormal.  If you develop any new or worsening symptoms or if your symptoms do not start to improve, please return here or follow-up with your primary care provider. If your symptoms are severe, please go to the emergency room.

## 2024-01-06 NOTE — ED Triage Notes (Signed)
 Pt presents with vaginal itching and discharge for 2-3days

## 2024-01-07 ENCOUNTER — Telehealth (HOSPITAL_COMMUNITY): Payer: Self-pay

## 2024-01-07 ENCOUNTER — Telehealth: Payer: Self-pay | Admitting: Internal Medicine

## 2024-01-07 LAB — CERVICOVAGINAL ANCILLARY ONLY
Bacterial Vaginitis (gardnerella): POSITIVE — AB
Candida Glabrata: NEGATIVE
Candida Vaginitis: NEGATIVE
Chlamydia: NEGATIVE
Comment: NEGATIVE
Comment: NEGATIVE
Comment: NEGATIVE
Comment: NEGATIVE
Comment: NEGATIVE
Comment: NORMAL
Neisseria Gonorrhea: NEGATIVE
Trichomonas: POSITIVE — AB

## 2024-01-07 MED ORDER — METRONIDAZOLE 500 MG PO TABS: 500.0000 mg | ORAL_TABLET | Freq: Two times a day (BID) | ORAL | 0 refills | Status: AC

## 2024-01-07 NOTE — Telephone Encounter (Signed)
 Spoke to PharmD at [employer] diagnosis code [phone].

## 2024-01-07 NOTE — Telephone Encounter (Signed)
 Per protocol, pt requires tx with metronidazole. Reviewed with patient, verified pharmacy, prescription sent.

## 2024-01-07 NOTE — Telephone Encounter (Signed)
 Last seen on 12/12/23 Follow up scheduled on 07/10/24   Did you want patient to continue methocarbamol 500 mg TID?  Per note on 12/12/23 " Continue oxcarbazepine 1800/day,  gabapentin 2700 mg/day, baclofen and tramadol.    Can increase tramadol to up to 3/day

## 2024-01-07 NOTE — Telephone Encounter (Signed)
 Pt c/o medication issue:  1. Name of Medication: Evolocumab (REPATHA SURECLICK) 140 MG/ML SOAJ   2. How are you currently taking this medication (dosage and times per day)? N/A  3. Are you having a reaction (difficulty breathing--STAT)? N/A  4. What is your medication issue? Pharmacy calling to request a diagnosis code. Please advise

## 2024-01-09 LAB — HSV CULTURE AND TYPING

## 2024-01-10 ENCOUNTER — Other Ambulatory Visit (HOSPITAL_COMMUNITY): Payer: Self-pay

## 2024-01-10 ENCOUNTER — Telehealth: Payer: Self-pay

## 2024-01-10 NOTE — Telephone Encounter (Signed)
 Pharmacy calling to check satus of PA

## 2024-01-10 NOTE — Telephone Encounter (Signed)
 Pharmacy Patient Advocate Encounter   Received notification from Physician's Office that prior authorization for REPATHA is required/requested.   Insurance verification completed.   The patient is insured through [facility] .   Per test claim: PA required; PA submitted to above mentioned insurance via CoverMyMeds Key/confirmation #/EOC [id number] Status is pending

## 2024-01-10 NOTE — Telephone Encounter (Signed)
 PA request has been Submitted. New Encounter has been or will be created for follow up. For additional info see Pharmacy Prior Auth telephone encounter from 01/10/24.

## 2024-01-11 ENCOUNTER — Other Ambulatory Visit (HOSPITAL_COMMUNITY): Payer: Self-pay

## 2024-01-11 NOTE — Telephone Encounter (Signed)
 Pharmacy Patient Advocate Encounter  Received notification from [facility] that Prior Authorization for REPATHA has been APPROVED from 01/10/24 to 01/09/25. Ran test claim, Copay is $35. This test claim was processed through [facility]- copay amounts may vary at other pharmacies due to pharmacy/plan contracts, or as the patient moves through the different stages of their insurance plan.

## 2024-01-11 NOTE — Telephone Encounter (Signed)
 Patient identification verified by 2 forms. Marilynn Rail, RN    Called and spoke to patient  Informed patient:   -PA approved for Repatha   -with [facility] cost is $[facility], could be different with other pharmacy  Patient verbalized understanding, no questions at this time

## 2024-01-14 ENCOUNTER — Inpatient Hospital Stay: Attending: Hematology and Oncology | Admitting: Hematology and Oncology

## 2024-01-14 VITALS — BP 136/72 | HR 76 | Temp 97.8°F | Resp 18 | Ht 61.5 in | Wt 132.5 lb

## 2024-01-14 DIAGNOSIS — Z923 Personal history of irradiation: Secondary | ICD-10-CM | POA: Insufficient documentation

## 2024-01-14 DIAGNOSIS — G373 Acute transverse myelitis in demyelinating disease of central nervous system: Secondary | ICD-10-CM | POA: Insufficient documentation

## 2024-01-14 DIAGNOSIS — Z170 Estrogen receptor positive status [ER+]: Secondary | ICD-10-CM | POA: Insufficient documentation

## 2024-01-14 DIAGNOSIS — Z1732 Human epidermal growth factor receptor 2 negative status: Secondary | ICD-10-CM | POA: Diagnosis not present

## 2024-01-14 DIAGNOSIS — C50511 Malignant neoplasm of lower-outer quadrant of right female breast: Secondary | ICD-10-CM | POA: Diagnosis present

## 2024-01-14 DIAGNOSIS — Z79899 Other long term (current) drug therapy: Secondary | ICD-10-CM | POA: Insufficient documentation

## 2024-01-14 DIAGNOSIS — Z1721 Progesterone receptor positive status: Secondary | ICD-10-CM | POA: Diagnosis not present

## 2024-01-14 DIAGNOSIS — Z79811 Long term (current) use of aromatase inhibitors: Secondary | ICD-10-CM | POA: Diagnosis not present

## 2024-01-14 NOTE — Assessment & Plan Note (Signed)
 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   Bilateral breast tenderness: ultrasound in both breasts.  05/06/2019: Benign   Breast cancer surveillance:  Mammogram: 05/04/2023: [facility]: Benign, breast density category B CT thoracic spine 08/20/2021: Negative for any thoracic spine fracture or lesions   Return to clinic  in 1 year for follow-up

## 2024-01-14 NOTE — Progress Notes (Signed)
 Patient Care Team: Patient, No Pcp Per as PCP - General (General Practice) Hilty, Lisette Abu, MD as PCP - Cardiology (Cardiology) Sater, Pearletha Furl, MD as Consulting Physician (Neurology) Griselda Miner, MD as Consulting Physician (General Surgery) Serena Croissant, MD as Consulting Physician (Hematology and Oncology) Dorothy Puffer, MD as Consulting Physician (Radiation Oncology) Runell Gess, MD as Consulting Physician (Cardiology)  DIAGNOSIS:  Encounter Diagnosis  Name Primary?   Malignant neoplasm of lower-outer quadrant of right breast of female, estrogen receptor positive (HCC) Yes    SUMMARY OF ONCOLOGIC HISTORY: Oncology History  Malignant neoplasm of lower-outer quadrant of right breast of female, estrogen receptor positive (HCC)  03/26/2017 Initial Diagnosis   Palpable right breast mass with a normal mammogram: 8:00 position by ultrasound 3.2 cm irregular mass: Grade 2 invasive lobular cancer ER 85%, PR 100%, Ki-67 2%, HER-2 negative ratio 1.5, T2 N0 stage II a clinical stage   04/20/2017 Genetic Testing   Genetic counseling and testing for hereditary cancer syndromes performed on . Results are negative for pathogenic mutations in 46 genes analyzed by [facility]'s Common Hereditary Cancers Panel. Results are dated . Genes tested: APC, ATM, AXIN2, BARD1, BMPR1A, BRCA1, BRCA2, BRIP1, CDH1, CDKN2A, CHEK2, CTNNA1, DICER1, EPCAM, GREM1, HOXB13, KIT, MEN1, MLH1, MSH2, MSH3, MSH6, MUTYH, NBN, NF1, NTHL1, PALB2, PDGFRA, PMS2, POLD1, POLE, PTEN, RAD50, RAD51C, RAD51D, SDHA, SDHB, SDHC, SDHD, SMAD4, SMARCA4, STK11, TP53, TSC1, TSC2, and VHL.  Variants of uncertain significance (not clinically actionable) were noted in APC and BRIP1.      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 She is currently on anastrozole, which she tolerates well. She reports occasional hot flashes, but these have improved. She denies any breast pain or discomfort. She also reports joint stiffness and achiness, which she attributes to her transverse myelitis. She had a mammogram at [facility] in July, which was normal     ALLERGIES:  is allergic to pork-derived products.  MEDICATIONS:  Current Outpatient Medications  Medication Sig Dispense Refill   amLODipine (NORVASC) 10 MG tablet TAKE 1 TABLET BY MOUTH EVERY DAY 90 tablet 2   anastrozole (ARIMIDEX) 1 MG tablet TAKE 1 TABLET(1 MG) BY MOUTH DAILY 90 tablet 3   aspirin 81 MG chewable tablet Chew 1 tablet (81 mg total) by mouth daily.     atorvastatin (LIPITOR) 80 MG tablet Take 1 tablet (80 mg total) by mouth daily. 90 tablet 3   baclofen (LIORESAL) 10 MG tablet TAKE 1 TABLET BY MOUTH 4 TIMES DAILY 120 tablet 11   diphenhydrAMINE (BENADRYL ALLERGY) 25 MG tablet Take 1 tablet (25 mg total) by mouth every 6 (six) hours for 3 days. (Patient not taking: Reported on 01/01/2023) 12 tablet 0   Evolocumab (REPATHA SURECLICK) 140 MG/ML SOAJ Inject 140 mg into the skin every 14 (fourteen) days. 2 mL 5  fluconazole (DIFLUCAN) 150 MG tablet Take 1 tablet (150 mg total) by mouth every 3 (three) days. 2 tablet 0   fluticasone (FLONASE) 50 MCG/ACT nasal spray Place 1-2 sprays into both nostrils daily as needed for allergies or rhinitis.     furosemide (LASIX) 20 MG tablet TAKE 1 TABLET EVERY DAY FOR FLUID RETENTION 30 tablet 2   gabapentin (NEURONTIN) 300 MG capsule Take 3 capsules  (900 MG) by mouth three times daily 270 capsule 5   Isopropyl Alcohol (SWIMMERS EAR DROPS OT) Place 2 drops into both ears daily as needed (water in ears).     lisinopril (ZESTRIL) 20 MG tablet Take 1 tablet (20 mg total) by mouth 2 (two) times daily. 180 tablet 3   methocarbamol (ROBAXIN) 500 MG tablet TAKE 1 TABLET(500 MG) BY MOUTH THREE TIMES DAILY AS NEEDED FOR MUSCLE SPASMS 90 tablet 1   metroNIDAZOLE (FLAGYL) 500 MG tablet Take 1 tablet (500 mg total) by mouth 2 (two) times daily for 7 days. 14 tablet 0   nitroGLYCERIN (NITROSTAT) 0.4 MG SL tablet PLACE 1 TABLET UNDER THE TONGUE EVERY 5 MINUTES AS NEEDED FOR CHEST PAIN 25 tablet 4   oxcarbazepine (TRILEPTAL) 600 MG tablet Take 1 tablet (600 mg total) by mouth 3 (three) times daily. 90 tablet 3   Probiotic Product (PROBIOTIC-10 PO) Take 1 capsule by mouth daily.     spironolactone (ALDACTONE) 50 MG tablet Take 1 tablet (50 mg total) by mouth daily. Take 1 tablet each morning and 1/2 tablet each evening. 90 tablet 3   traMADol (ULTRAM) 50 MG tablet TAKE 1 TABLET(50 MG) BY MOUTH UP TO THREE TIMES A DAILY AS NEEDED 90 tablet 5   venlafaxine XR (EFFEXOR-XR) 75 MG 24 hr capsule TAKE 1 CAPSULE BY MOUTH DAILY WITH BREAKFAST 90 capsule 1   No current facility-administered medications for this visit.    PHYSICAL EXAMINATION: ECOG PERFORMANCE STATUS: 1 - Symptomatic but completely ambulatory  Vitals:   01/14/24 1219  BP: 136/72  Pulse: 76  Resp: 18  Temp: 97.8 F (36.6 C)  SpO2: 100%   Filed Weights   01/14/24 1219  Weight: 132 lb 8 oz (60.1 kg)    Physical Exam BREAST: Breasts normal on exam.  (exam performed in the presence of a chaperone)  LABORATORY DATA:  I have reviewed the data as listed    Latest Ref Rng & Units 10/29/2023    8:37 AM 11/08/2022    9:18 AM 08/12/2021    4:07 AM  CMP  Glucose 70 - 99 mg/dL 74  88  80   BUN 6 - 24 mg/dL 14  12  12    Creatinine 0.57 - 1.00 mg/dL 5.78  4.69  6.29   Sodium 134 - 144 mmol/L  141  143  134   Potassium 3.5 - 5.2 mmol/L 4.5  4.2  4.9   Chloride 96 - 106 mmol/L 104  105  105   CO2 20 - 29 mmol/L 22  23  19    Calcium 8.7 - 10.2 mg/dL 52.8  41.3  9.5   Total Protein 6.0 - 8.5 g/dL  6.7    Total Bilirubin 0.0 - 1.2 mg/dL  <2.4    Alkaline Phos 44 - 121 IU/L  119    AST 0 - 40 IU/L  25    ALT 0 - 32 IU/L  25      Lab Results  Component Value Date   WBC 10.3 11/08/2022   HGB 13.8 11/08/2022  HCT 40.0 11/08/2022   MCV 91 11/08/2022   PLT 355 11/08/2022   NEUTROABS 5.6 11/08/2022    ASSESSMENT & PLAN:  Malignant neoplasm of lower-outer quadrant of right breast of female, estrogen receptor positive (HCC) 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   Bilateral breast tenderness: ultrasound in both breasts.  05/06/2019: Benign   Breast cancer surveillance:  Mammogram: 05/04/2023: [facility]: Benign, breast density category B CT thoracic spine 08/20/2021: Negative for any thoracic spine fracture or lesions   Assessment & Plan Malignant neoplasm of lower-outer quadrant of right breast, estrogen receptor positive Currently on anastrozole with mild hot flashes. No breast pain. Mammogram shows favorable tissue density. - Continue anastrozole 1 MG oral daily. - Perform breast exam today. - Schedule follow-up mammogram in July.  Transverse  myelitis Joint stiffness and achiness present. No new symptoms. - Continue current management for transverse myelitis.  Return to clinic in 1 year for follow-up    No orders of the defined types were placed in this encounter.  The patient has a good understanding of the overall plan. she agrees with it. she will call with any problems that may develop before the next visit here. Total time spent: 30 mins including face to face time and time spent for planning, charting and co-ordination of care   Tamsen Meek, MD 01/14/24

## 2024-01-17 ENCOUNTER — Ambulatory Visit: Payer: 59 | Admitting: Neurology

## 2024-01-20 ENCOUNTER — Ambulatory Visit
Admission: RE | Admit: 2024-01-20 | Discharge: 2024-01-20 | Discharge status: Home / Self Care | Source: Ambulatory Visit | Attending: Family Medicine

## 2024-01-20 VITALS — BP 159/94 | HR 91 | Temp 98.1°F | Resp 17

## 2024-01-20 DIAGNOSIS — N898 Other specified noninflammatory disorders of vagina: Secondary | ICD-10-CM | POA: Diagnosis present

## 2024-01-20 NOTE — Discharge Instructions (Signed)
 Test results will be released to your MyChart account We will contact you if anything is positive and requires treatment.

## 2024-01-20 NOTE — ED Triage Notes (Addendum)
 Pt c/o vaginal discharge and itching since 3/28. Symptoms are better but not resolved   She was seen on 01/06/24 for same symptoms.Swab showed trich. She took metronidazole

## 2024-01-20 NOTE — ED Provider Notes (Signed)
 [facility]    CSN: [id number] Arrival date & time: 01/20/24  1022      History   Chief Complaint Chief Complaint  Patient presents with   Vaginal Discharge    Vaginal discharge - Entered by patient    HPI Anna Henson is a 53 y.o. female.   The history is provided by the patient.  Vaginal Discharge Continued vaginal discharge.  States was seen 01/06/2023 diagnosed with BV and trichomonas was treated with metronidazole symptoms are improved but but symptoms never fully cleared.  Denies dysuria, [place], urgency, hematuria, genital rash, new sexual contacts since last testing, concern for other STI. No known drug allergies. Past Medical History:  Diagnosis Date   Anemia    Anxiety    Bipolar disorder in full remission (HCC)    CAD in native artery cardiologist--  dr hilty   a. 09/2014 Cath/PCI in setting of [place]  - s/p 2.25 x 12 mm Promus Premier DES to mid RCA;  b. 11/2016 Myoview: EF 56%, no ischemia;  c. 12/2016 NSTEMI/PCI: LM nl, LAd 25p, LCX 30p, RCA 100p (2.75x38 Promus Premier DES), mRCA 10 ISR, EF 50-55%.   CKD (chronic kidney disease), stage III (HCC)    Depression    Genetic testing 04/23/2017   Ms. Thaker underwent genetic counseling and testing for hereditary cancer syndromes on 04/05/2017. Her results were negative for mutations in all 46 genes analyzed by [facility]'s 46-gene Common Hereditary Cancers Panel. Genes analyzed include: APC, ATM, AXIN2, BARD1, BMPR1A, BRCA1, BRCA2, BRIP1, CDH1, CDKN2A, CHEK2, CTNNA1, DICER1, EPCAM, GREM1, HOXB13, KIT, MEN1, MLH1, MSH2, MSH3, MSH6, MUTYH, NBN,   GERD (gastroesophageal reflux disease)    Headache    History of external beam radiation therapy    right breast 07-27-2017  to 09-25-2017   History of non-ST elevation myocardial infarction (NSTEMI)    HOCM (hypertrophic obstructive cardiomyopathy) (HCC) followed by cardiology   a. 12/2016 Echo: EF 65-70%,  mod conc LVH, dynamic obstruction @ rest, peak velocity of 291 cm/sec w/  peak gradient of , no rwma, Gr1 DD, triv TR, PASP .   Hyperlipidemia    Hypertension    Malignant neoplasm of lower-outer quadrant of right breast of female, estrogen receptor positive [facility]) oncologist--- dr Lee Public   dx 06/ 2018--- right breast invasive lobular carcinoma, ductal carcinoma w/ LCIS---- 06-21-2017 s/p right breast lumpecotmy w/ sln disseciton;   completed radiation 09-25-2017;  on tamoxifen   Myocardial infarction (HCC)    2017   S/P drug eluting coronary stent placement    09-14-2014---  PCI with DES x1 to midRCA;  12-31-2016  PCI with DES x1 to proxRCA   Transverse myelitis [facility]) neurologist--- dr Godwin Lat   01/ 2017  dx transverse myelitis w/ right side numbness (09-01-2019  currently lower extremity weakness, mucsle spasms, and gait disturbance)   Wears glasses     Patient Active Problem List   Diagnosis Date Noted   Renal vascular disease 08/15/2022   Claudication in peripheral vascular disease (HCC) 08/11/2021   Bacterial vaginosis 08/10/2021   Peripheral arterial disease (HCC) 06/17/2021   Cyst of ovary 11/15/2020   History of myocardial infarction 11/15/2020   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   Malignant tumor of breast (HCC) 11/15/2020   Oligomenorrhea 11/15/2020   Uterine leiomyoma 11/15/2020   Hyponatremia 10/14/2020   Chronic diastolic CHF (congestive heart failure) (HCC) 10/08/2020   Neuropathic pain 07/06/2020   Cervical spinal stenosis 06/15/2020   Lhermitte's sign positive 06/15/2020   Labial cyst  Complex cyst of right ovary 12/05/2019   Mood disorder (HCC) 10/13/2019   Hypertensive urgency 07/28/2018   Chronic anemia    MDD (major depressive disorder), recurrent episode, severe (HCC) 08/28/2017   Neck stiffness 08/22/2017   Abnormal finding on MRI of brain 08/22/2017   HOCM (hypertrophic obstructive cardiomyopathy) (HCC)    Chest pain 05/06/2017   Genetic testing 04/23/2017   Malignant neoplasm of lower-outer quadrant of  right breast of female, estrogen receptor positive (HCC) 03/29/2017   Dysesthesia 03/12/2017   Palpitations 02/23/2017   S/P cardiac cath (12/2016) a. acute total occlusion of the RCA tx w/PCI DES, b. mild nonobs LAD/LCx stenosis, c. 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   Acute-on-chronic kidney injury (HCC) 11/12/2015   Neck pain 11/12/2015   Hypokalemia 11/12/2015   Abnormal MRI, neck 11/12/2015   Numbness 11/12/2015   Stage 3a chronic kidney disease (HCC) 11/12/2015   Hypertensive crisis    Hyperlipidemia    Coronary artery disease involving native coronary artery of native heart without angina pectoris    Facial numbness    Right arm numbness    Throat pain 11/09/2015   Drooling 11/09/2015   CAD (coronary artery disease), native coronary artery 09/15/2014   Headache 09/15/2014   UTI (urinary tract infection): Probable 09/14/2014   Candida infection of genital region 09/14/2014   Unstable angina (HCC) 09/13/2014   HLD (hyperlipidemia) 09/13/2014   Otitis 09/13/2014   ACS (acute coronary syndrome) (HCC) 09/13/2014   Tobacco abuse 09/13/2014   Essential hypertension 09/09/2014    Past Surgical History:  Procedure Laterality Date   ABDOMINAL AORTOGRAM W/LOWER EXTREMITY N/A 08/11/2021   Procedure: ABDOMINAL AORTOGRAM W/LOWER EXTREMITY;  Surgeon: Avanell Leigh, MD;  Location: [facility] INVASIVE CV LAB;  Service: Cardiovascular;  Laterality: N/A;   BREAST LUMPECTOMY WITH RADIOACTIVE SEED AND SENTINEL LYMPH NODE BIOPSY Right 06/21/2017   Procedure: RIGHT BREAST BRACKETED SEED GUIDED LUMPECTOMY AND SENTINEL LYMPH NODE BIOPSY;  Surgeon:  Caralyn Chandler, MD;  Location: [facility] OR;  Service: General;  Laterality: Right;   CORONARY/GRAFT ACUTE MI REVASCULARIZATION N/A 12/31/2016   Procedure: Coronary/Graft Acute MI Revascularization;  Surgeon: Arnoldo Lapping, MD;  Location: [facility] INVASIVE CV LAB;  Service: Cardiovascular;  Laterality: N/A;   DILATATION & CURETTAGE/HYSTEROSCOPY WITH MYOSURE N/A 09/02/2019   Procedure: DILATATION & CURETTAGE/HYSTEROSCOPY WITH MYOSURE;  Surgeon: Audelia Leaks, MD;  Location: [facility];  Service: Gynecology;  Laterality: N/A;   HERNIA REPAIR     LEFT HEART CATH AND CORONARY ANGIOGRAPHY N/A 12/31/2016   Procedure: Left Heart Cath and Coronary Angiography;  Surgeon: Arnoldo Lapping, MD;  Location: [facility] INVASIVE CV LAB;  Service: Cardiovascular;  Laterality: N/A;   LEFT HEART CATHETERIZATION WITH CORONARY ANGIOGRAM N/A 09/14/2014   Procedure: LEFT HEART CATHETERIZATION WITH CORONARY ANGIOGRAM;  Surgeon: Odie Benne, MD;  Location: [facility] CATH LAB;  Service: Cardiovascular;  Laterality: N/A;   MASS EXCISION N/A 03/30/2020   Procedure: EXCISION MASS RIGHT LABIA MINORA;  Surgeon: Suzi Essex, MD;  Location: [facility] ORS;  Service: Gynecology;  Laterality: N/A;   PERCUTANEOUS CORONARY STENT INTERVENTION (PCI-S)  09/14/2014   Procedure: PERCUTANEOUS  CORONARY STENT INTERVENTION (PCI-S);  Surgeon: Odie Benne, MD;  Location: [facility] CATH LAB;  Service: Cardiovascular;;   PERIPHERAL VASCULAR INTERVENTION  08/11/2021   Procedure: PERIPHERAL VASCULAR INTERVENTION;  Surgeon: Avanell Leigh, MD;  Location: [facility] INVASIVE CV LAB;  Service: Cardiovascular;;   ROBOTIC ASSISTED SALPINGO OOPHERECTOMY Bilateral 03/30/2020   Procedure: XI ROBOTIC ASSISTED SALPINGO OOPHORECTOMY;  Surgeon: Suzi Essex, MD;  Location: [facility] ORS;  Service: Gynecology;  Laterality: Bilateral;   TONSILLECTOMY  2005   UMBILICAL HERNIA REPAIR  2001; 2005    OB History   No obstetric history on file.      Home  Medications    Prior to Admission medications   Medication Sig Start Date End Date Taking? Authorizing Provider  amLODipine (NORVASC) 10 MG tablet TAKE 1 TABLET BY MOUTH EVERY DAY 11/20/23   Hilty, Aviva Lemmings, MD  anastrozole (ARIMIDEX) 1 MG tablet TAKE 1 TABLET(1 MG) BY MOUTH DAILY 01/07/24   Gudena, Vinay, MD  aspirin 81 MG chewable tablet Chew 1 tablet (81 mg total) by mouth daily. 09/16/14   Samtani, Jai-Gurmukh, MD  atorvastatin (LIPITOR) 80 MG tablet Take 1 tablet (80 mg total) by mouth daily. 11/05/23   Hilty, Aviva Lemmings, MD  baclofen (LIORESAL) 10 MG tablet TAKE 1 TABLET BY MOUTH 4 TIMES DAILY 09/28/22   Sater, Sherida Dimmer, MD  diphenhydrAMINE (BENADRYL ALLERGY) 25 MG tablet Take 1 tablet (25 mg total) by mouth every 6 (six) hours for 3 days. Patient not taking: Reported on 01/01/2023 05/30/22 08/15/22  Eloise Hake Scales, PA-C  Evolocumab (REPATHA SURECLICK) 140 MG/ML SOAJ Inject 140 mg into the skin every 14 (fourteen) days. 11/05/23   Hilty, Aviva Lemmings, MD  fluconazole (DIFLUCAN) 150 MG tablet Take 1 tablet (150 mg total) by mouth every 3 (three) days. 01/06/24   Stanhope, Catharine M, FNP  fluticasone (FLONASE) 50 MCG/ACT nasal spray Place 1-2 sprays into both nostrils daily as needed for allergies or rhinitis.    [provider]  furosemide (LASIX) 20 MG tablet TAKE 1 TABLET EVERY DAY FOR FLUID RETENTION 09/26/21   Hilty, Aviva Lemmings, MD  gabapentin (NEURONTIN) 300 MG capsule Take 3 capsules (900 MG) by mouth three times daily 12/12/23   Sater, Sherida Dimmer, MD  Isopropyl Alcohol (SWIMMERS EAR DROPS OT) Place 2 drops into both ears daily as needed (water in ears).    [provider]  lisinopril (ZESTRIL) 20 MG tablet Take 1 tablet (20 mg total) by mouth 2 (two) times daily. 11/05/23   Hilty, Aviva Lemmings, MD  methocarbamol (ROBAXIN) 500 MG tablet TAKE 1 TABLET(500 MG) BY MOUTH THREE TIMES DAILY AS NEEDED FOR MUSCLE SPASMS 01/07/24   Sater, Sherida Dimmer, MD  nitroGLYCERIN (NITROSTAT) 0.4 MG  SL tablet PLACE 1 TABLET UNDER THE TONGUE EVERY 5 MINUTES AS NEEDED FOR CHEST PAIN 07/15/21   Hazle Lites, MD  oxcarbazepine (TRILEPTAL) 600 MG tablet Take 1 tablet (600 mg total) by mouth 3 (three) times daily. 12/12/23   Sater, Sherida Dimmer, MD  Probiotic Product (PROBIOTIC-10 PO) Take 1 capsule by mouth daily.    [provider]  spironolactone (ALDACTONE) 50 MG tablet Take 1 tablet (50 mg total) by mouth daily. Take 1 tablet each morning and 1/2 tablet each evening. 08/15/23   Hilty, Aviva Lemmings, MD  traMADol (ULTRAM) 50 MG tablet TAKE 1 TABLET(50 MG) BY MOUTH UP TO THREE TIMES A DAILY AS NEEDED 12/12/23   Godwin Lat, Sherida Dimmer, MD  venlafaxine XR (EFFEXOR-XR) 75 MG 24  hr capsule TAKE 1 CAPSULE BY MOUTH DAILY WITH BREAKFAST 07/31/23   Cameron Cea, MD    Family History Family History  Problem Relation Age of Onset   Diabetes Mother    Heart disease Mother    Hyperlipidemia Mother    Hypertension Mother    Breast cancer Mother    Lung cancer Father    Drug abuse Father    Brain cancer Father 77   Bone cancer Father 66   Drug abuse Sister    Mental illness Brother    Mental illness Maternal Grandmother    Stomach cancer Paternal Grandmother 4       d.75    Social History Social History   Tobacco Use   Smoking status: Former    Current packs/day: 0.00    Average packs/day: 0.5 packs/day for 30.0 years (15.0 ttl pk-yrs)    Types: Cigarettes    Start date: 10/04/1985    Quit date: 10/05/2015    Years since quitting: 8.2   Smokeless tobacco: Never  Vaping Use   Vaping status: Former  Substance Use Topics   Alcohol use: Not Currently    Alcohol/week: 2.0 standard drinks of alcohol    Types: 2 Cans of beer per week    Comment: occas.   Drug use: No     Allergies   Pork-derived products   Review of Systems Review of Systems  Genitourinary:  Positive for vaginal discharge.     Physical Exam Triage Vital Signs ED Triage Vitals  Encounter Vitals Group     BP  01/20/24 1030 (!) 159/94     Systolic BP Percentile --      Diastolic BP Percentile --      Pulse Rate 01/20/24 1030 91     Resp 01/20/24 1030 17     Temp 01/20/24 1030 98.1 F (36.7 C)     Temp Source 01/20/24 1030 Oral     SpO2 01/20/24 1030 95 %     Weight --      Height --      Head Circumference --      Peak Flow --      Pain Score 01/20/24 1033 0     Pain Loc --      Pain Education --      Exclude from Growth Chart --    No data found.  Updated Vital Signs BP (!) 159/94 (BP Location: Right Arm)   Pulse 91   Temp 98.1 F (36.7 C) (Oral)   Resp 17   LMP 03/06/2020   SpO2 95%   Visual Acuity Right Eye Distance:   Left Eye Distance:   Bilateral Distance:    Right Eye Near:   Left Eye Near:    Bilateral Near:     Physical Exam Vitals and nursing note reviewed.  Constitutional:      Appearance: She is not ill-appearing.  HENT:     Mouth/Throat:     Mouth: Mucous membranes are moist.  Eyes:     General:        Right eye: No discharge.        Left eye: No discharge.     Conjunctiva/sclera: Conjunctivae normal.  Cardiovascular:     Rate and Rhythm: Normal rate and regular rhythm.  Pulmonary:     Effort: Pulmonary effort is normal.     Breath sounds: Normal breath sounds.  Abdominal:     General: Bowel sounds are normal.     Palpations:  Abdomen is soft.     Tenderness: There is no abdominal tenderness. There is no right CVA tenderness or left CVA tenderness.  Musculoskeletal:     Cervical back: Neck supple.  Lymphadenopathy:     Cervical: No cervical adenopathy.  Skin:    General: Skin is warm and dry.  Neurological:     Mental Status: She is alert.      UC Treatments / Results  Labs (all labs ordered are listed, but only abnormal results are displayed) Labs Reviewed  CERVICOVAGINAL ANCILLARY ONLY    EKG   Radiology No results found.  Procedures Procedures (including critical care time)  Medications Ordered in UC Medications - No  data to display  Initial Impression / Assessment and Plan / UC Course  I have reviewed the triage vital signs and the nursing notes.  Pertinent labs & imaging results that were available during my care of the patient were reviewed by me and considered in my medical decision making (see chart for details).    53 year old female recently tested positive for BV and trichomonas completed course of treatment but feels her symptoms did not fully resolve.  Repeat test today, will withhold treatment pending results of cytology swab. Final Clinical Impressions(s) / UC Diagnoses   Final diagnoses:  Vaginal discharge   Discharge Instructions   None    ED Prescriptions   None    PDMP not reviewed this encounter.   Laquanda Bick, [place] 01/20/24 1110

## 2024-01-23 LAB — CERVICOVAGINAL ANCILLARY ONLY
Bacterial Vaginitis (gardnerella): NEGATIVE
Candida Glabrata: NEGATIVE
Candida Vaginitis: NEGATIVE
Comment: NEGATIVE
Comment: NEGATIVE
Comment: NEGATIVE
Comment: NEGATIVE
Trichomonas: NEGATIVE

## 2024-02-05 ENCOUNTER — Other Ambulatory Visit: Payer: Self-pay | Admitting: Hematology and Oncology

## 2024-02-10 ENCOUNTER — Emergency Department (HOSPITAL_COMMUNITY)

## 2024-02-10 ENCOUNTER — Encounter (HOSPITAL_COMMUNITY): Payer: Self-pay

## 2024-02-10 ENCOUNTER — Inpatient Hospital Stay (HOSPITAL_COMMUNITY)
Admission: EM | Admit: 2024-02-10 | Discharge: 2024-02-13 | DRG: 280 | Disposition: A | Discharge status: Home / Self Care | Attending: Internal Medicine | Admitting: Internal Medicine

## 2024-02-10 ENCOUNTER — Inpatient Hospital Stay (HOSPITAL_COMMUNITY)

## 2024-02-10 ENCOUNTER — Other Ambulatory Visit: Payer: Self-pay

## 2024-02-10 DIAGNOSIS — Z79899 Other long term (current) drug therapy: Secondary | ICD-10-CM | POA: Diagnosis not present

## 2024-02-10 DIAGNOSIS — Z8249 Family history of ischemic heart disease and other diseases of the circulatory system: Secondary | ICD-10-CM | POA: Diagnosis not present

## 2024-02-10 DIAGNOSIS — I214 Non-ST elevation (NSTEMI) myocardial infarction: Principal | ICD-10-CM | POA: Diagnosis present

## 2024-02-10 DIAGNOSIS — J180 Bronchopneumonia, unspecified organism: Secondary | ICD-10-CM | POA: Diagnosis present

## 2024-02-10 DIAGNOSIS — Z833 Family history of diabetes mellitus: Secondary | ICD-10-CM

## 2024-02-10 DIAGNOSIS — Z803 Family history of malignant neoplasm of breast: Secondary | ICD-10-CM

## 2024-02-10 DIAGNOSIS — I200 Unstable angina: Secondary | ICD-10-CM | POA: Diagnosis not present

## 2024-02-10 DIAGNOSIS — I2510 Atherosclerotic heart disease of native coronary artery without angina pectoris: Secondary | ICD-10-CM | POA: Diagnosis present

## 2024-02-10 DIAGNOSIS — F3170 Bipolar disorder, currently in remission, most recent episode unspecified: Secondary | ICD-10-CM | POA: Diagnosis present

## 2024-02-10 DIAGNOSIS — I10 Essential (primary) hypertension: Secondary | ICD-10-CM | POA: Diagnosis not present

## 2024-02-10 DIAGNOSIS — Z87891 Personal history of nicotine dependence: Secondary | ICD-10-CM | POA: Diagnosis not present

## 2024-02-10 DIAGNOSIS — I130 Hypertensive heart and chronic kidney disease with heart failure and stage 1 through stage 4 chronic kidney disease, or unspecified chronic kidney disease: Secondary | ICD-10-CM | POA: Diagnosis present

## 2024-02-10 DIAGNOSIS — Z808 Family history of malignant neoplasm of other organs or systems: Secondary | ICD-10-CM

## 2024-02-10 DIAGNOSIS — Z91014 Allergy to mammalian meats: Secondary | ICD-10-CM

## 2024-02-10 DIAGNOSIS — Z79811 Long term (current) use of aromatase inhibitors: Secondary | ICD-10-CM | POA: Diagnosis not present

## 2024-02-10 DIAGNOSIS — I160 Hypertensive urgency: Secondary | ICD-10-CM | POA: Diagnosis present

## 2024-02-10 DIAGNOSIS — Z888 Allergy status to other drugs, medicaments and biological substances status: Secondary | ICD-10-CM

## 2024-02-10 DIAGNOSIS — I421 Obstructive hypertrophic cardiomyopathy: Secondary | ICD-10-CM | POA: Diagnosis present

## 2024-02-10 DIAGNOSIS — J189 Pneumonia, unspecified organism: Principal | ICD-10-CM

## 2024-02-10 DIAGNOSIS — R7989 Other specified abnormal findings of blood chemistry: Secondary | ICD-10-CM | POA: Diagnosis not present

## 2024-02-10 DIAGNOSIS — Z83438 Family history of other disorder of lipoprotein metabolism and other lipidemia: Secondary | ICD-10-CM | POA: Diagnosis not present

## 2024-02-10 DIAGNOSIS — K2990 Gastroduodenitis, unspecified, without bleeding: Secondary | ICD-10-CM | POA: Diagnosis present

## 2024-02-10 DIAGNOSIS — R079 Chest pain, unspecified: Secondary | ICD-10-CM | POA: Diagnosis not present

## 2024-02-10 DIAGNOSIS — Z7982 Long term (current) use of aspirin: Secondary | ICD-10-CM

## 2024-02-10 DIAGNOSIS — I739 Peripheral vascular disease, unspecified: Secondary | ICD-10-CM | POA: Diagnosis present

## 2024-02-10 DIAGNOSIS — R739 Hyperglycemia, unspecified: Secondary | ICD-10-CM | POA: Diagnosis present

## 2024-02-10 DIAGNOSIS — E871 Hypo-osmolality and hyponatremia: Secondary | ICD-10-CM | POA: Diagnosis present

## 2024-02-10 DIAGNOSIS — R0789 Other chest pain: Secondary | ICD-10-CM | POA: Diagnosis present

## 2024-02-10 DIAGNOSIS — K219 Gastro-esophageal reflux disease without esophagitis: Secondary | ICD-10-CM | POA: Diagnosis present

## 2024-02-10 DIAGNOSIS — Z79810 Long term (current) use of selective estrogen receptor modulators (SERMs): Secondary | ICD-10-CM

## 2024-02-10 DIAGNOSIS — Z800 Family history of malignant neoplasm of digestive organs: Secondary | ICD-10-CM

## 2024-02-10 DIAGNOSIS — Z170 Estrogen receptor positive status [ER+]: Secondary | ICD-10-CM

## 2024-02-10 DIAGNOSIS — I21A9 Other myocardial infarction type: Secondary | ICD-10-CM | POA: Diagnosis present

## 2024-02-10 DIAGNOSIS — I701 Atherosclerosis of renal artery: Secondary | ICD-10-CM | POA: Diagnosis present

## 2024-02-10 DIAGNOSIS — Z5986 Financial insecurity: Secondary | ICD-10-CM

## 2024-02-10 DIAGNOSIS — E785 Hyperlipidemia, unspecified: Secondary | ICD-10-CM | POA: Diagnosis present

## 2024-02-10 DIAGNOSIS — I252 Old myocardial infarction: Secondary | ICD-10-CM

## 2024-02-10 DIAGNOSIS — Z923 Personal history of irradiation: Secondary | ICD-10-CM

## 2024-02-10 DIAGNOSIS — Z813 Family history of other psychoactive substance abuse and dependence: Secondary | ICD-10-CM

## 2024-02-10 DIAGNOSIS — Z818 Family history of other mental and behavioral disorders: Secondary | ICD-10-CM

## 2024-02-10 DIAGNOSIS — Z853 Personal history of malignant neoplasm of breast: Secondary | ICD-10-CM | POA: Diagnosis not present

## 2024-02-10 DIAGNOSIS — N1831 Chronic kidney disease, stage 3a: Secondary | ICD-10-CM | POA: Diagnosis present

## 2024-02-10 DIAGNOSIS — Z801 Family history of malignant neoplasm of trachea, bronchus and lung: Secondary | ICD-10-CM

## 2024-02-10 DIAGNOSIS — I5032 Chronic diastolic (congestive) heart failure: Secondary | ICD-10-CM | POA: Diagnosis present

## 2024-02-10 LAB — COMPREHENSIVE METABOLIC PANEL WITH GFR
ALT: 21 U/L (ref 0–44)
AST: 28 U/L (ref 15–41)
Albumin: 3.8 g/dL (ref 3.5–5.0)
Alkaline Phosphatase: 102 U/L (ref 38–126)
Anion gap: 13 (ref 5–15)
BUN: 7 mg/dL (ref 6–20)
CO2: 21 mmol/L — ABNORMAL LOW (ref 22–32)
Calcium: 9.1 mg/dL (ref 8.9–10.3)
Chloride: 98 mmol/L (ref 98–111)
Creatinine, Ser: 0.77 mg/dL (ref 0.44–1.00)
GFR, Estimated: >60 mL/min (ref >60)
Glucose, Bld: 188 mg/dL — ABNORMAL HIGH (ref 70–99)
Potassium: 3.6 mmol/L (ref 3.5–5.1)
Sodium: 132 mmol/L — ABNORMAL LOW (ref 135–145)
Total Bilirubin: 0.4 mg/dL (ref 0.0–1.2)
Total Protein: 7.4 g/dL (ref 6.5–8.1)

## 2024-02-10 LAB — CBC WITH DIFFERENTIAL/PLATELET
Abs Immature Granulocytes: 0.19 10*3/uL — ABNORMAL HIGH (ref 0.00–0.07)
Basophils Absolute: 0.1 10*3/uL (ref 0.0–0.1)
Basophils Relative: 0 %
Eosinophils Absolute: 0.3 10*3/uL (ref 0.0–0.5)
Eosinophils Relative: 1 %
HCT: 40.3 % (ref 36.0–46.0)
Hemoglobin: 14.4 g/dL (ref 12.0–15.0)
Immature Granulocytes: 1 %
Lymphocytes Relative: 13 %
Lymphs Abs: 3.3 10*3/uL (ref 0.7–4.0)
MCH: 31.6 pg (ref 26.0–34.0)
MCHC: 35.7 g/dL (ref 30.0–36.0)
MCV: 88.4 fL (ref 80.0–100.0)
Monocytes Absolute: 1.1 10*3/uL — ABNORMAL HIGH (ref 0.1–1.0)
Monocytes Relative: 4 %
Neutro Abs: 21.4 10*3/uL — ABNORMAL HIGH (ref 1.7–7.7)
Neutrophils Relative %: 81 %
Platelets: 393 10*3/uL (ref 150–400)
RBC: 4.56 MIL/uL (ref 3.87–5.11)
RDW: 14.3 % (ref 11.5–15.5)
Smear Review: NORMAL
WBC: 26.4 10*3/uL — ABNORMAL HIGH (ref 4.0–10.5)
nRBC: 0 % (ref 0.0–0.2)

## 2024-02-10 LAB — ECHOCARDIOGRAM COMPLETE
AR max vel: 2.2 cm2
AV Area VTI: 2.16 cm2
AV Area mean vel: 2.42 cm2
AV Mean grad: 5.5 mmHg
AV Peak grad: 12.8 mmHg
Ao pk vel: 1.79 m/s
Area-P 1/2: 5.02 cm2
Weight: 2119.94 [oz_av]

## 2024-02-10 LAB — TROPONIN I (HIGH SENSITIVITY)
Troponin I (High Sensitivity): 243 ng/L (ref <18)
Troponin I (High Sensitivity): 679 ng/L (ref <18)
Troponin I (High Sensitivity): 498 ng/L (ref <18)
Troponin I (High Sensitivity): 500 ng/L (ref <18)
Troponin I (High Sensitivity): 370 ng/L (ref <18)

## 2024-02-10 LAB — URINALYSIS, ROUTINE W REFLEX MICROSCOPIC
Bacteria, UA: NONE SEEN
Bilirubin Urine: NEGATIVE
Glucose, UA: >=500 mg/dL — AB
Hgb urine dipstick: NEGATIVE
Ketones, ur: NEGATIVE mg/dL
Leukocytes,Ua: NEGATIVE
Nitrite: NEGATIVE
Protein, ur: >=300 mg/dL — AB
Specific Gravity, Urine: 1.007 (ref 1.005–1.030)
pH: 7 (ref 5.0–8.0)

## 2024-02-10 LAB — HEMOGLOBIN A1C
Hgb A1c MFr Bld: 5.5 % (ref 4.8–5.6)
Mean Plasma Glucose: 111.15 mg/dL

## 2024-02-10 LAB — HEPARIN LEVEL (UNFRACTIONATED): Heparin Unfractionated: 0.24 [IU]/mL — ABNORMAL LOW (ref 0.30–0.70)

## 2024-02-10 LAB — ETHANOL: Alcohol, Ethyl (B): <15 mg/dL (ref <15)

## 2024-02-10 LAB — GLUCOSE, CAPILLARY
Glucose-Capillary: 101 mg/dL — ABNORMAL HIGH (ref 70–99)
Glucose-Capillary: 92 mg/dL (ref 70–99)

## 2024-02-10 LAB — HIV ANTIBODY (ROUTINE TESTING W REFLEX): HIV Screen 4th Generation wRfx: NONREACTIVE

## 2024-02-10 LAB — LACTIC ACID, PLASMA: Lactic Acid, Venous: 0.8 mmol/L (ref 0.5–1.9)

## 2024-02-10 LAB — BRAIN NATRIURETIC PEPTIDE: B Natriuretic Peptide: 180.8 pg/mL — ABNORMAL HIGH (ref 0.0–100.0)

## 2024-02-10 LAB — LIPASE, BLOOD: Lipase: 28 U/L (ref 11–51)

## 2024-02-10 MED ORDER — GABAPENTIN 300 MG PO CAPS
900.0000 mg | ORAL_CAPSULE | Freq: Three times a day (TID) | ORAL | Status: DC
  Administered 2024-02-10 – 2024-02-13 (×10): 900 mg via ORAL
  Filled 2024-02-10 (×5): qty 3
  Filled 2024-02-10: qty 9
  Filled 2024-02-10 (×4): qty 3

## 2024-02-10 MED ORDER — SODIUM CHLORIDE 0.9 % IV SOLN
500.0000 mg | INTRAVENOUS | Status: DC
  Administered 2024-02-11 – 2024-02-12 (×2): 500 mg via INTRAVENOUS
  Filled 2024-02-10 (×2): qty 5

## 2024-02-10 MED ORDER — SODIUM CHLORIDE 0.9 % IV SOLN
1.0000 g | Freq: Once | INTRAVENOUS | Status: AC
  Administered 2024-02-10: 1 g via INTRAVENOUS
  Filled 2024-02-10: qty 10

## 2024-02-10 MED ORDER — LISINOPRIL 20 MG PO TABS
20.0000 mg | ORAL_TABLET | Freq: Two times a day (BID) | ORAL | Status: DC
  Administered 2024-02-10 – 2024-02-11 (×4): 20 mg via ORAL
  Filled 2024-02-10 (×4): qty 1

## 2024-02-10 MED ORDER — FLUTICASONE PROPIONATE 50 MCG/ACT NA SUSP: 1.0000 | Freq: Every day | NASAL | Status: DC | PRN

## 2024-02-10 MED ORDER — ATORVASTATIN CALCIUM 40 MG PO TABS: 40.0000 mg | ORAL_TABLET | Freq: Every day | ORAL | Status: DC

## 2024-02-10 MED ORDER — NITROGLYCERIN 0.4 MG SL SUBL
0.4000 mg | SUBLINGUAL_TABLET | SUBLINGUAL | Status: DC | PRN
  Administered 2024-02-10 (×2): 0.4 mg via SUBLINGUAL
  Filled 2024-02-10: qty 1

## 2024-02-10 MED ORDER — HEPARIN BOLUS VIA INFUSION
3000.0000 [IU] | Freq: Once | INTRAVENOUS | Status: AC
  Administered 2024-02-10: 3000 [IU] via INTRAVENOUS
  Filled 2024-02-10: qty 3000

## 2024-02-10 MED ORDER — SODIUM CHLORIDE 0.9 % IV SOLN
1.0000 g | INTRAVENOUS | Status: DC
  Administered 2024-02-11 – 2024-02-12 (×2): 1 g via INTRAVENOUS
  Filled 2024-02-10 (×2): qty 10

## 2024-02-10 MED ORDER — ONDANSETRON HCL 4 MG/2ML IJ SOLN: 4.0000 mg | Freq: Four times a day (QID) | INTRAMUSCULAR | Status: DC | PRN

## 2024-02-10 MED ORDER — SODIUM CHLORIDE 0.9 % IV SOLN
500.0000 mg | Freq: Once | INTRAVENOUS | Status: AC
  Administered 2024-02-10: 500 mg via INTRAVENOUS
  Filled 2024-02-10: qty 5

## 2024-02-10 MED ORDER — ASPIRIN 81 MG PO CHEW
81.0000 mg | CHEWABLE_TABLET | Freq: Every day | ORAL | Status: DC
  Administered 2024-02-12 – 2024-02-13 (×2): 81 mg via ORAL
  Filled 2024-02-10 (×3): qty 1

## 2024-02-10 MED ORDER — OXCARBAZEPINE 300 MG PO TABS
600.0000 mg | ORAL_TABLET | Freq: Three times a day (TID) | ORAL | Status: DC
  Administered 2024-02-10 – 2024-02-13 (×10): 600 mg via ORAL
  Filled 2024-02-10 (×12): qty 2

## 2024-02-10 MED ORDER — VENLAFAXINE HCL ER 75 MG PO CP24
75.0000 mg | ORAL_CAPSULE | Freq: Every day | ORAL | Status: DC
  Administered 2024-02-10 – 2024-02-13 (×4): 75 mg via ORAL
  Filled 2024-02-10 (×4): qty 1

## 2024-02-10 MED ORDER — METOCLOPRAMIDE HCL 5 MG/ML IJ SOLN
10.0000 mg | Freq: Once | INTRAMUSCULAR | Status: AC
  Administered 2024-02-10: 10 mg via INTRAVENOUS
  Filled 2024-02-10: qty 2

## 2024-02-10 MED ORDER — ATORVASTATIN CALCIUM 80 MG PO TABS
80.0000 mg | ORAL_TABLET | Freq: Every day | ORAL | Status: DC
  Administered 2024-02-10 – 2024-02-13 (×4): 80 mg via ORAL
  Filled 2024-02-10 (×2): qty 1
  Filled 2024-02-10: qty 2
  Filled 2024-02-10: qty 1

## 2024-02-10 MED ORDER — ANASTROZOLE 1 MG PO TABS
1.0000 mg | ORAL_TABLET | Freq: Every day | ORAL | Status: DC
  Administered 2024-02-11 – 2024-02-13 (×3): 1 mg via ORAL
  Filled 2024-02-10 (×3): qty 1

## 2024-02-10 MED ORDER — MORPHINE SULFATE (PF) 2 MG/ML IV SOLN: 1.0000 mg | INTRAVENOUS | Status: DC | PRN

## 2024-02-10 MED ORDER — INSULIN ASPART 100 UNIT/ML IJ SOLN
0.0000 [IU] | Freq: Three times a day (TID) | INTRAMUSCULAR | Status: DC
  Administered 2024-02-11 (×2): 2 [IU] via SUBCUTANEOUS
  Administered 2024-02-12: 3 [IU] via SUBCUTANEOUS
  Administered 2024-02-13 (×2): 2 [IU] via SUBCUTANEOUS

## 2024-02-10 MED ORDER — FENTANYL CITRATE PF 50 MCG/ML IJ SOSY
50.0000 ug | PREFILLED_SYRINGE | Freq: Once | INTRAMUSCULAR | Status: AC
  Administered 2024-02-10: 50 ug via INTRAVENOUS
  Filled 2024-02-10: qty 1

## 2024-02-10 MED ORDER — HEPARIN (PORCINE) 25000 UT/250ML-% IV SOLN
900.0000 [IU]/h | INTRAVENOUS | Status: DC
  Administered 2024-02-10: 750 [IU]/h via INTRAVENOUS
  Administered 2024-02-11: 900 [IU]/h via INTRAVENOUS
  Filled 2024-02-10 (×2): qty 250

## 2024-02-10 MED ORDER — ASPIRIN 81 MG PO CHEW
324.0000 mg | CHEWABLE_TABLET | Freq: Once | ORAL | Status: AC
  Administered 2024-02-10: 324 mg via ORAL
  Filled 2024-02-10: qty 4

## 2024-02-10 MED ORDER — ACETAMINOPHEN 325 MG PO TABS
650.0000 mg | ORAL_TABLET | ORAL | Status: DC | PRN
  Administered 2024-02-10 – 2024-02-11 (×4): 650 mg via ORAL
  Filled 2024-02-10 (×4): qty 2

## 2024-02-10 MED ORDER — METOPROLOL TARTRATE 12.5 MG HALF TABLET
12.5000 mg | ORAL_TABLET | Freq: Two times a day (BID) | ORAL | Status: DC
  Administered 2024-02-10 – 2024-02-11 (×4): 12.5 mg via ORAL
  Filled 2024-02-10 (×4): qty 1

## 2024-02-10 MED ORDER — IOHEXOL 350 MG/ML SOLN
75.0000 mL | Freq: Once | INTRAVENOUS | Status: AC | PRN
  Administered 2024-02-10: 75 mL via INTRAVENOUS

## 2024-02-10 MED ORDER — NITROGLYCERIN 2 % TD OINT
1.0000 [in_us] | TOPICAL_OINTMENT | Freq: Three times a day (TID) | TRANSDERMAL | Status: DC
  Administered 2024-02-10 – 2024-02-13 (×3): 1 [in_us] via TOPICAL
  Filled 2024-02-10 (×3): qty 1

## 2024-02-10 MED ORDER — SPIRONOLACTONE 12.5 MG HALF TABLET
12.5000 mg | ORAL_TABLET | Freq: Every day | ORAL | Status: DC
  Administered 2024-02-10 – 2024-02-11 (×2): 12.5 mg via ORAL
  Filled 2024-02-10 (×2): qty 1

## 2024-02-10 NOTE — H&P (Addendum)
 History and Physical    Patient: Anna Henson:[id number] DOB: Feb 28, 1971 DOA: 02/10/2024 DOS: the patient was seen and examined on 02/10/2024 PCP: Patient, No Pcp Per  Patient coming from: Home Chief complaint: Chief Complaint  Patient presents with   Emesis   HPI:  Anna Henson is a 53 y.o. female with past medical history  of  cervical spinal stenosis, history of transverse myelitis, neuropathy, chronic pain, coronary artery disease with stent in 2018, hypertension, and depression, now presenting to the emergency department with chest pain.  Patient states currently she is not having any chest pain and she is feeling better.  She no longer smokes she was quit several years ago.  She denies any alcohol. And patient overall denies any any other complaints of nausea morning diarrhea abdominal pain fevers chills.  Patient has a strong cardiac and PAD history per chart review in 2022 patient had a abdominal aortogram and bilateral iliac angiogram with bifemoral runoff showing High-grade physiologically significant distal left common/proximal left external iliac artery stenosis distal left common iliac artery PTA to preserve the hypogastric artery.   ED Course: Pt in ed at bedside  is alert awake oriented in no distress. Vital signs in the ED were notable for the following:  Vitals:   02/10/24 1300 02/10/24 1307 02/10/24 1359 02/10/24 1503  BP: (!) 179/74  (!) 179/74 (!) 166/95  Pulse: 68  68 64  Temp:  97.7 F (36.5 C)  98.8 F (37.1 C)  Resp: 19   20  Weight:      SpO2: 98%   100%  TempSrc:  Oral  Oral  >>ED evaluation thus far shows: EKG today shows sinus rhythm 69 PR 158 left atrial enlargement, QRS of 82 and QTc of 463.  T wave inversions in lead V1 otherwise no Q waves , nonspecific ST elevation  in V5 V6.  Initial troponin of 240 3 repeat at 679. CMP showing glucose of 188 otherwise normal kidney function normal LFTs normal electrolytes. CBC shows white count of 26.4 normal  hemoglobin of 14.4 and platelets of 393. Urinalysis shows straw-colored urine with more than 500 glucose.  >>While in the ED patient received the following: Medications  nitroGLYCERIN  (NITROSTAT ) SL tablet 0.4 mg (0.4 mg Sublingual Given 02/10/24 0850)  azithromycin  (ZITHROMAX ) 500 mg in sodium chloride  0.9 % 250 mL IVPB (500 mg Intravenous New Bag/Given 02/10/24 0848)  heparin  bolus via infusion 3,000 Units (has no administration in time range)  heparin  ADULT infusion 100 units/mL (25000 units/250mL) (has no administration in time range)  cefTRIAXone  (ROCEPHIN ) 1 g in sodium chloride  0.9 % 100 mL IVPB (has no administration in time range)  azithromycin  (ZITHROMAX ) 500 mg in sodium chloride  0.9 % 250 mL IVPB (has no administration in time range)  metoCLOPramide  (REGLAN ) injection 10 mg (10 mg Intravenous Given 02/10/24 0519)  fentaNYL  (SUBLIMAZE ) injection 50 mcg (50 mcg Intravenous Given 02/10/24 0538)  iohexol  (OMNIPAQUE ) 350 MG/ML injection 75 mL (75 mLs Intravenous Contrast Given 02/10/24 0642)  aspirin  chewable tablet 324 mg (324 mg Oral Given 02/10/24 0809)  cefTRIAXone  (ROCEPHIN ) 1 g in sodium chloride  0.9 % 100 mL IVPB (0 g Intravenous Stopped 02/10/24 0851)   Review of Systems  Cardiovascular:  Positive for chest pain.  All other systems reviewed and are negative.  Past Medical History:  Diagnosis Date   Anemia    Anxiety    Bipolar disorder in full remission (HCC)    CAD in native artery cardiologist--  dr Maximo Spar  a. 09/2014 Cath/PCI in setting of [place]  - s/p 2.25 x 12 mm Promus Premier DES to mid RCA;  b. 11/2016 Myoview : EF 56%, no ischemia;  c. 12/2016 NSTEMI/PCI: LM nl, LAd 25p, LCX 30p, RCA 100p (2.75x38 Promus Premier DES), mRCA 10 ISR, EF 50-55%.   CKD (chronic kidney disease), stage III (HCC)    Depression    Genetic testing 04/23/2017   Ms. Taitano underwent genetic counseling and testing for hereditary cancer syndromes on 04/05/2017. Her results were negative for mutations in all 46  genes analyzed by [facility]'s 46-gene Common Hereditary Cancers Panel. Genes analyzed include: APC, ATM, AXIN2, BARD1, BMPR1A, BRCA1, BRCA2, BRIP1, CDH1, CDKN2A, CHEK2, CTNNA1, DICER1, EPCAM, GREM1, HOXB13, KIT, MEN1, MLH1, MSH2, MSH3, MSH6, MUTYH, NBN,   GERD (gastroesophageal reflux disease)    Headache    History of external beam radiation therapy    right breast 07-27-2017  to 09-25-2017   History of non-ST elevation myocardial infarction (NSTEMI)    HOCM (hypertrophic obstructive cardiomyopathy) (HCC) followed by cardiology   a. 12/2016 Echo: EF 65-70%,  mod conc LVH, dynamic obstruction @ rest, peak velocity of 291 cm/sec w/ peak gradient of , no rwma, Gr1 DD, triv TR, PASP .   Hyperlipidemia    Hypertension    Malignant neoplasm of lower-outer quadrant of right breast of female, estrogen receptor positive [facility]) oncologist--- dr Lee Public   dx 06/ 2018--- right breast invasive lobular carcinoma, ductal carcinoma w/ LCIS---- 06-21-2017 s/p right breast lumpecotmy w/ sln disseciton;   completed radiation 09-25-2017;  on tamoxifen    Myocardial infarction [facility])    2017   S/P drug eluting coronary stent placement    09-14-2014---  PCI with DES x1 to midRCA;  12-31-2016  PCI with DES x1 to proxRCA   Transverse myelitis [facility]) neurologist--- dr Godwin Lat   01/ 2017  dx transverse myelitis w/ right side numbness (09-01-2019  currently lower extremity weakness, mucsle spasms, and gait disturbance)   Wears glasses    Past Surgical History:  Procedure Laterality Date   ABDOMINAL AORTOGRAM W/LOWER EXTREMITY N/A 08/11/2021   Procedure: ABDOMINAL AORTOGRAM W/LOWER EXTREMITY;  Surgeon: Avanell Leigh, MD;  Location: [facility] INVASIVE CV LAB;  Service: Cardiovascular;  Laterality: N/A;   BREAST LUMPECTOMY WITH RADIOACTIVE SEED AND SENTINEL LYMPH NODE BIOPSY Right 06/21/2017   Procedure: RIGHT BREAST BRACKETED SEED GUIDED LUMPECTOMY AND SENTINEL LYMPH NODE BIOPSY;  Surgeon: Caralyn Chandler, MD;  Location:  [facility] OR;  Service: General;  Laterality: Right;   CORONARY/GRAFT ACUTE MI REVASCULARIZATION N/A 12/31/2016   Procedure: Coronary/Graft Acute MI Revascularization;  Surgeon: Arnoldo Lapping, MD;  Location: [facility] INVASIVE CV LAB;  Service: Cardiovascular;  Laterality: N/A;   DILATATION & CURETTAGE/HYSTEROSCOPY WITH MYOSURE N/A 09/02/2019   Procedure: DILATATION & CURETTAGE/HYSTEROSCOPY WITH MYOSURE;  Surgeon: Audelia Leaks, MD;  Location: [facility];  Service: Gynecology;  Laterality: N/A;   HERNIA REPAIR     LEFT HEART CATH AND CORONARY ANGIOGRAPHY N/A 12/31/2016   Procedure: Left Heart Cath and Coronary Angiography;  Surgeon: Arnoldo Lapping, MD;  Location: [facility] INVASIVE CV LAB;  Service: Cardiovascular;  Laterality: N/A;   LEFT HEART CATHETERIZATION WITH CORONARY ANGIOGRAM N/A 09/14/2014   Procedure: LEFT HEART CATHETERIZATION WITH CORONARY ANGIOGRAM;  Surgeon: Odie Benne, MD;  Location: [facility] CATH LAB;  Service: Cardiovascular;  Laterality: N/A;   MASS EXCISION N/A 03/30/2020   Procedure: EXCISION MASS RIGHT LABIA MINORA;  Surgeon: Suzi Essex, MD;  Location: [facility] ORS;  Service: Gynecology;  Laterality: N/A;  PERCUTANEOUS CORONARY STENT INTERVENTION (PCI-S)  09/14/2014   Procedure: PERCUTANEOUS CORONARY STENT INTERVENTION (PCI-S);  Surgeon: Odie Benne, MD;  Location: [facility] CATH LAB;  Service: Cardiovascular;;   PERIPHERAL VASCULAR INTERVENTION  08/11/2021   Procedure: PERIPHERAL VASCULAR INTERVENTION;  Surgeon: Avanell Leigh, MD;  Location: [facility] INVASIVE CV LAB;  Service: Cardiovascular;;   ROBOTIC ASSISTED SALPINGO OOPHERECTOMY Bilateral 03/30/2020   Procedure: XI ROBOTIC ASSISTED SALPINGO OOPHORECTOMY;  Surgeon: Suzi Essex, MD;  Location: [facility] ORS;  Service: Gynecology;  Laterality: Bilateral;   TONSILLECTOMY  2005   UMBILICAL HERNIA REPAIR  2001; 2005    reports that she quit smoking about 8 years ago. Her smoking use included cigarettes. She started  smoking about 38 years ago. She has a 15 pack-year smoking history. She has never used smokeless tobacco. She reports that she does not currently use alcohol after a past usage of about 2.0 standard drinks of alcohol per week. She reports that she does not use drugs. Allergies  Allergen Reactions   Pork-Derived Products Other (See Comments)    Does NOT eat pork   Family History  Problem Relation Age of Onset   Diabetes Mother    Heart disease Mother    Hyperlipidemia Mother    Hypertension Mother    Breast cancer Mother    Lung cancer Father    Drug abuse Father    Brain cancer Father 20   Bone cancer Father 84   Drug abuse Sister    Mental illness Brother    Mental illness Maternal Grandmother    Stomach cancer Paternal Grandmother 56       d.75   Prior to Admission medications   Medication Sig Start Date End Date Taking? Authorizing Provider  amLODipine  (NORVASC ) 10 MG tablet TAKE 1 TABLET BY MOUTH EVERY DAY 11/20/23   Hilty, Aviva Lemmings, MD  anastrozole  (ARIMIDEX ) 1 MG tablet TAKE 1 TABLET(1 MG) BY MOUTH DAILY 01/07/24   Cameron Cea, MD  aspirin  81 MG chewable tablet Chew 1 tablet (81 mg total) by mouth daily. 09/16/14   Samtani, Jai-Gurmukh, MD  atorvastatin  (LIPITOR ) 80 MG tablet Take 1 tablet (80 mg total) by mouth daily. 11/05/23   Hazle Lites, MD  baclofen  (LIORESAL ) 10 MG tablet TAKE 1 TABLET BY MOUTH 4 TIMES DAILY 09/28/22   Sater, Sherida Dimmer, MD  diphenhydrAMINE  (BENADRYL  ALLERGY) 25 MG tablet Take 1 tablet (25 mg total) by mouth every 6 (six) hours for 3 days. Patient not taking: Reported on 01/01/2023 05/30/22 08/15/22  Eloise Hake Scales, PA-C  Evolocumab  (REPATHA  SURECLICK) 140 MG/ML SOAJ Inject 140 mg into the skin every 14 (fourteen) days. 11/05/23   Hilty, Aviva Lemmings, MD  fluconazole  (DIFLUCAN ) 150 MG tablet Take 1 tablet (150 mg total) by mouth every 3 (three) days. 01/06/24   Starlene Eaton, FNP  fluticasone  (FLONASE ) 50 MCG/ACT nasal spray Place 1-2 sprays  into both nostrils daily as needed for allergies or rhinitis.    [provider]  furosemide  (LASIX ) 20 MG tablet TAKE 1 TABLET EVERY DAY FOR FLUID RETENTION 09/26/21   Hilty, Aviva Lemmings, MD  gabapentin  (NEURONTIN ) 300 MG capsule Take 3 capsules (900 MG) by mouth three times daily 12/12/23   Sater, Sherida Dimmer, MD  Isopropyl Alcohol (SWIMMERS EAR DROPS OT) Place 2 drops into both ears daily as needed (water  in ears).    [provider]  lisinopril  (ZESTRIL ) 20 MG tablet Take 1 tablet (20 mg total) by mouth 2 (two) times daily.  11/05/23   Hilty, Aviva Lemmings, MD  methocarbamol  (ROBAXIN ) 500 MG tablet TAKE 1 TABLET(500 MG) BY MOUTH THREE TIMES DAILY AS NEEDED FOR MUSCLE SPASMS 01/07/24   Sater, Sherida Dimmer, MD  nitroGLYCERIN  (NITROSTAT ) 0.4 MG SL tablet PLACE 1 TABLET UNDER THE TONGUE EVERY 5 MINUTES AS NEEDED FOR CHEST PAIN 07/15/21   Hazle Lites, MD  oxcarbazepine  (TRILEPTAL ) 600 MG tablet Take 1 tablet (600 mg total) by mouth 3 (three) times daily. 12/12/23   Sater, Sherida Dimmer, MD  Probiotic Product (PROBIOTIC-10 PO) Take 1 capsule by mouth daily.    [provider]  spironolactone  (ALDACTONE ) 50 MG tablet Take 1 tablet (50 mg total) by mouth daily. Take 1 tablet each morning and 1/2 tablet each evening. 08/15/23   Hilty, Aviva Lemmings, MD  traMADol  (ULTRAM ) 50 MG tablet TAKE 1 TABLET(50 MG) BY MOUTH UP TO THREE TIMES A DAILY AS NEEDED 12/12/23   Sater, Sherida Dimmer, MD  venlafaxine  XR (EFFEXOR -XR) 75 MG 24 hr capsule TAKE 1 CAPSULE BY MOUTH DAILY WITH BREAKFAST 02/05/24   Cameron Cea, MD                                                                                 Vitals:   02/10/24 1300 02/10/24 1307 02/10/24 1359 02/10/24 1503  BP: (!) 179/74  (!) 179/74 (!) 166/95  Pulse: 68  68 64  Resp: 19   20  Temp:  97.7 F (36.5 C)  98.8 F (37.1 C)  TempSrc:  Oral  Oral  SpO2: 98%   100%  Weight:       Physical Exam Vitals and nursing note reviewed.  Constitutional:      General: She  is not in acute distress. HENT:     Head: Normocephalic and atraumatic.     Right Ear: Hearing normal.     Left Ear: Hearing normal.     Nose: No nasal deformity.     Mouth/Throat:     Lips: Pink.  Eyes:     General: Lids are normal.     Extraocular Movements: Extraocular movements intact.  Cardiovascular:     Rate and Rhythm: Normal rate and regular rhythm.     Heart sounds: Normal heart sounds.  Pulmonary:     Effort: Pulmonary effort is normal.     Breath sounds: Normal breath sounds.  Abdominal:     General: Bowel sounds are normal. There is no distension.     Palpations: Abdomen is soft. There is no mass.     Tenderness: There is no abdominal tenderness.  Musculoskeletal:     Right lower leg: No edema.     Left lower leg: No edema.  Skin:    General: Skin is warm.  Neurological:     General: No focal deficit present.     Mental Status: She is alert and oriented to person, place, and time.     Cranial Nerves: Cranial nerves 2-12 are intact.  Psychiatric:        Speech: Speech normal.     Labs on Admission: I have personally reviewed following labs and imaging studies CBC: Recent Labs  Lab 02/10/24 0504  WBC 26.4*  NEUTROABS 21.4*  HGB 14.4  HCT 40.3  MCV 88.4  PLT 393   Basic Metabolic Panel: Recent Labs  Lab 02/10/24 0504  NA 132*  K 3.6  CL 98  CO2 21*  GLUCOSE 188*  BUN 7  CREATININE 0.77  CALCIUM  9.1   GFR: Estimated Creatinine Clearance: 70.1 mL/min (by C-G formula based on SCr of 0.77 mg/dL). Liver Function Tests: Recent Labs  Lab 02/10/24 0504  AST 28  ALT 21  ALKPHOS 102  BILITOT 0.4  PROT 7.4  ALBUMIN 3.8   Recent Labs  Lab 02/10/24 0504  LIPASE 28   Urine analysis:    Component Value Date/Time   COLORURINE STRAW (A) 02/10/2024 0505   APPEARANCEUR CLEAR 02/10/2024 0505   LABSPEC 1.007 02/10/2024 0505   PHURINE 7.0 02/10/2024 0505   GLUCOSEU >=500 (A) 02/10/2024 0505   HGBUR NEGATIVE 02/10/2024 0505   BILIRUBINUR  NEGATIVE 02/10/2024 0505   BILIRUBINUR negative 01/06/2024 1039   BILIRUBINUR neg 01/16/2015 0940   KETONESUR NEGATIVE 02/10/2024 0505   PROTEINUR >=300 (A) 02/10/2024 0505   UROBILINOGEN 0.2 01/06/2024 1039   UROBILINOGEN 1.0 09/13/2014 1529   NITRITE NEGATIVE 02/10/2024 0505   LEUKOCYTESUR NEGATIVE 02/10/2024 0505   Radiological Exams on Admission: ECHOCARDIOGRAM COMPLETE Result Date: 02/10/2024    ECHOCARDIOGRAM REPORT   Patient Name:   MAXIME NEEF Date of Exam: 02/10/2024 Medical Rec #:  [id number]     Height:       61.5 in Accession #:    [id number]    Weight:       132.5 lb Date of Birth:  1971/05/06     BSA:          1.595 m Patient Age:    52 years      BP:           179/74 mmHg Patient Gender: F             HR:           61 bpm. Exam Location:  Inpatient Procedure: 2D Echo (Both Spectral and Color Flow Doppler were utilized during            procedure). Indications:    chest pain  History:        Patient has prior history of Echocardiogram examinations, most                 recent 10/15/2020. Cardiomyopathy and hypertrophic obstructive                 cardiomyopathy, CAD, chronic kidney disease; Risk                 Factors:Dyslipidemia, Hypertension and Former Smoker.  Sonographer:    Dione Franks RDCS Referring Phys: [id number] Metta Actis  Sonographer Comments: Global longitudinal strain was attempted. IMPRESSIONS  1. Mild SAM in the setting of moderate symmetrical left ventricular hypertrophy. Mildly turbulent LVOT flow but no significant gradient. . Left ventricular ejection fraction, by estimation, is 60 to 65%. The left ventricle has normal function. The left ventricle demonstrates regional wall motion abnormalities (see scoring diagram/findings for description). There is moderate left ventricular hypertrophy. Left ventricular diastolic parameters are indeterminate.  2. Right ventricular systolic function is normal. The right ventricular size is normal. Tricuspid regurgitation signal  is inadequate for assessing PA pressure.  3. The mitral valve is abnormal. Mild mitral valve regurgitation. No evidence of mitral stenosis.  4. The aortic valve is tricuspid. Aortic valve regurgitation is not  visualized. No aortic stenosis is present.  5. The inferior vena cava is normal in size with greater than 50% respiratory variability, suggesting right atrial pressure of 3 mmHg. FINDINGS  Left Ventricle: Mild SAM in the setting of moderate symmetrical left ventricular hypertrophy. Mildly turbulent LVOT flow but no significant gradient. Left ventricular ejection fraction, by estimation, is 60 to 65%. The left ventricle has normal function. The left ventricle demonstrates regional wall motion abnormalities. Strain was performed and the global longitudinal strain is indeterminate. The left ventricular internal cavity size was normal in size. There is moderate left ventricular hypertrophy. Left ventricular diastolic parameters are indeterminate.  LV Wall Scoring: The mid anteroseptal segment is hypokinetic. Right Ventricle: The right ventricular size is normal. Right vetricular wall thickness was not well visualized. Right ventricular systolic function is normal. Tricuspid regurgitation signal is inadequate for assessing PA pressure. Left Atrium: Left atrial size was normal in size. Right Atrium: Right atrial size was normal in size. Pericardium: There is no evidence of pericardial effusion. Mitral Valve: The mitral valve is abnormal. Mild mitral valve regurgitation. No evidence of mitral valve stenosis. Tricuspid Valve: The tricuspid valve is normal in structure. Tricuspid valve regurgitation is not demonstrated. No evidence of tricuspid stenosis. Aortic Valve: The aortic valve is tricuspid. Aortic valve regurgitation is not visualized. No aortic stenosis is present. Aortic valve mean gradient measures 5.5 mmHg. Aortic valve peak gradient measures 12.8 mmHg. Aortic valve area, by VTI measures 2.16  cm. Pulmonic  Valve: The pulmonic valve was not well visualized. Pulmonic valve regurgitation is mild. No evidence of pulmonic stenosis. Aorta: The aortic root and ascending aorta are structurally normal, with no evidence of dilitation. Venous: The inferior vena cava is normal in size with greater than 50% respiratory variability, suggesting right atrial pressure of 3 mmHg. IAS/Shunts: No atrial level shunt detected by color flow Doppler.  LEFT VENTRICLE PLAX 2D LV PW:         1.30 cm   Diastology LV IVS:        1.30 cm   LV e' medial:    7.62 cm/s LVOT diam:     1.90 cm   LV E/e' medial:  13.6 LV SV:         80        LV e' lateral:   8.05 cm/s LV SV Index:   50        LV E/e' lateral: 12.9 LVOT Area:     2.84 cm  RIGHT VENTRICLE             IVC RV Basal diam:  2.80 cm     IVC diam: 1.60 cm RV S prime:     11.30 cm/s TAPSE (M-mode): 2.1 cm LEFT ATRIUM             Index        RIGHT ATRIUM          Index LA Vol (A2C):   52.0 ml 32.60 ml/m  RA Area:     9.43 cm LA Vol (A4C):   57.2 ml 35.86 ml/m  RA Volume:   19.30 ml 12.10 ml/m LA Biplane Vol: 55.0 ml 34.48 ml/m  AORTIC VALVE AV Area (Vmax):    2.20 cm AV Area (Vmean):   2.42 cm AV Area (VTI):     2.16 cm AV Vmax:           179.17 cm/s AV Vmean:          106.389 cm/s  AV VTI:            0.371 m AV Peak Grad:      12.8 mmHg AV Mean Grad:      5.5 mmHg LVOT Vmax:         139.00 cm/s LVOT Vmean:        90.700 cm/s LVOT VTI:          0.283 m LVOT/AV VTI ratio: 0.76  AORTA Ao Root diam: 2.30 cm Ao Asc diam:  2.50 cm MITRAL VALVE MV Area (PHT): 5.02 cm     SHUNTS MV Decel Time: 151 msec     Systemic VTI:  0.28 m MV E velocity: 104.00 cm/s  Systemic Diam: 1.90 cm MV A velocity: 76.40 cm/s MV E/A ratio:  1.36 Armida Lander MD Electronically signed by Armida Lander MD Signature Date/Time: 02/10/2024/3:02:54 PM    Final    CT Head Wo Contrast Result Date: 02/10/2024 CLINICAL DATA:  Sudden onset severe headache and dizziness. EXAM: CT HEAD WITHOUT CONTRAST TECHNIQUE: Contiguous  axial images were obtained from the base of the skull through the vertex without intravenous contrast. RADIATION DOSE REDUCTION: This exam was performed according to the departmental dose-optimization program which includes automated exposure control, adjustment of the mA and/or kV according to patient size and/or use of iterative reconstruction technique. COMPARISON:  Head CT 07/28/2018 FINDINGS: Brain: No evidence of acute infarction, hemorrhage, hydrocephalus, extra-axial collection or mass lesion/mass effect. Trace mineralization is again noted in the basal ganglia, benign dural calcifications along side of the falx near the vertex. Vascular: Age advanced calcific plaques both siphons. No hyperdense central vessels. Skull: Negative for fractures or focal lesions. Sinuses/Orbits: No acute findings. Other: None. IMPRESSION: 1. No acute intracranial CT findings or interval changes. 2. Age advanced calcific plaques both siphons. Electronically Signed   By: Denman Fischer M.D.   On: 02/10/2024 07:27   CT Angio Chest/Abd/Pel for Dissection W and/or W/WO Result Date: 02/10/2024 CLINICAL DATA:  Chest pains and dizziness. Acute aortic syndrome suspected. EXAM: CT ANGIOGRAPHY CHEST, ABDOMEN AND PELVIS TECHNIQUE: Non-contrast CT of the chest was initially obtained. Multidetector CT imaging through the chest, abdomen and pelvis was performed using the standard protocol during bolus administration of intravenous contrast. Multiplanar reconstructed images and MIPs were obtained and reviewed to evaluate the vascular anatomy. RADIATION DOSE REDUCTION: This exam was performed according to the departmental dose-optimization program which includes automated exposure control, adjustment of the mA and/or kV according to patient size and/or use of iterative reconstruction technique. CONTRAST:  75mL OMNIPAQUE  IOHEXOL  350 MG/ML SOLN COMPARISON:  Chest CT without contrast 11/12/2015, CTA abdomen and pelvis 11/19/2020. In addition,  portable chest from today, PA Lat chest 12/01/2020. FINDINGS: CTA CHEST FINDINGS Cardiovascular: The cardiac size is normal. There is age advanced three-vessel coronary calcification with prior right coronary artery stenting. There is no significant pericardial effusion. Pulmonary arteries and veins are normal in caliber. There is preferential opacification of aorta but there is good opacification of the pulmonary arteries as well, and no embolism at least to the segmental level. There are scattered calcific plaques in aortic arch and great vessels but there is no aneurysm, stenosis or dissection. The aorta is normal in contour and course. Mediastinum/Nodes: No enlarged mediastinal, hilar, or axillary lymph nodes. Thyroid  gland, trachea, and esophagus demonstrate no significant findings. Lungs/Pleura: There is diffuse bronchial thickening. There is patchy peribronchovascular airspace disease in the posterior basal segment of the right lower lobe consistent with bronchopneumonia. There are mild centrilobular  emphysematous changes in both upper lobes. Since 2017 there is interval new subpleural reticulation anteriorly in the right upper middle lobes consistent with breast cancer XRT scarring. There is a 6 mm new subpleural ground-glass nodule in the anterior right upper lobe on 9:47, 4 mm new right upper lobe ground-glass nodule on 9:, a 4 mm subpleural right upper lobe ground-glass nodule on 9:56, and a 5 mm pleural-based ground-glass nodule in right lower lobe on 9:61. There is a chronic 6 mm stable fissural nodule in the left lower lobe on 9:9. Remainder of the lungs are clear. No pleural effusion, thickening or pneumothorax. Musculoskeletal: No regional bone metastasis is seen or acute osseous findings. There are postsurgical changes in the outer right breast consistent with lumpectomy. Clustered surgical clips right axilla. No mass in the visualized chest wall. Review of the MIP images confirms the above  findings. CTA ABDOMEN AND PELVIS FINDINGS VASCULAR Aorta: Again noted is moderate to heavy age advanced fibrofatty and calcific aortic plaque greatest in the infrarenal segment. There is no critical stenosis. No aneurysm or dissection. Celiac: Interval new 50% soft plaque origin stenosis along the lower vessel wall. The remainder is widely patent. SMA: Normal. Renals: Both are single. On the right there is interval new 70% soft plaque focal stenosis 8 mm distal to the vessel ostium, with no other flow-limiting narrowing. On the left, age advanced fibrofatty calcific plaques narrow the proximal 6 mm of the vessel by 60%, but this was seen previously. The remainder is widely patent. IMA: Patent without evidence of aneurysm, dissection, vasculitis or significant stenosis. Inflow: Patent interval stenting of the left common iliac artery and proximal left external iliac artery. There is stable mild dilatation distal left common iliac and proximal external iliac artery up to 9 mm. There are moderate patchy calcifications in both common iliac and internal iliac arteries. High-grade bilateral internal iliac artery origin stenoses are again noted and moderate irregular stenosis of the remainder of both. The external iliac arteries are widely patent with asymmetric scattered calcific plaque on the right. Veins: No obvious venous abnormality within the limitations of this arterial phase study. Review of the MIP images confirms the above findings. NON-VASCULAR Hepatobiliary: No focal liver abnormality is seen. No gallstones, gallbladder wall thickening, or biliary dilatation. Pancreas: No abnormality. Spleen: No abnormality. Adrenals/Urinary Tract: No adrenal mass. 1.2 cm Bosniak 1 cyst in the posterior right kidney measures 13 Hounsfield units. There are scattered bilateral additional tiny cortical Bosniak 2 cysts which are too small to characterize. No follow-up imaging is recommended. There is no mass enhancement, no  obstruction. No bladder thickening. Stomach/Bowel: Moderate thickened folds of the stomach diffusely, thickened folds in duodenal bulb and descending segment. Findings consistent with gastroduodenitis or peptic ulcer disease but this was also seen previously. Rest of the small bowel is unremarkable.  Normal appendix. Moderate fecal stasis ascending and transverse colon. No evidence of colitis or diverticulitis. Lymphatic: No adenopathy. Reproductive: Uterus and bilateral adnexa are unremarkable. Other: Small volume of pelvic cul-de-sac ascites. No other ascites. No free air. Umbilical fat hernia. Musculoskeletal: No acute or significant osseous findings. Review of the MIP images confirms the above findings. IMPRESSION: 1. No evidence of aortic aneurysm or dissection. 2. Age advanced aortic and coronary artery atherosclerosis. 3. Right lower lobe bronchopneumonia.  Diffuse thickening with COPD. 4. Multiple new ground-glass nodules in the right lung, largest 6 mm. The usual Fleischner criteria do not apply in cancer patients. Three-month follow-up study is recommended as the nodules  are too small to biopsy and too small to be evaluated on PET-CT. 5. Interval new 50% soft plaque origin stenosis of the celiac artery. 6. Interval new 70% soft plaque focal stenosis of the proximal right renal artery. 7. Stable 60% stenosis of the proximal left renal artery. 8. Patent interval stenting of the left common iliac artery and proximal left external iliac artery. 9. Stable mild dilatation of the distal left common iliac and proximal external iliac artery up to 9 mm. 10. Stable high-grade bilateral internal iliac artery origin stenoses and moderate irregular stenosis of the remainder of both. 11. Constipation. 12. Stable thickened folds of the stomach and duodenal bulb and descending segment consistent with gastroduodenitis or peptic ulcer disease. 13. Small volume of pelvic cul-de-sac ascites, nonspecific. 14. Umbilical fat  hernia. Aortic Atherosclerosis (ICD10-I70.0) and Emphysema (ICD10-J43.9). Fall Electronically Signed   By: Denman Fischer M.D.   On: 02/10/2024 07:23   DG Chest Port 1 View Result Date: 02/10/2024 CLINICAL DATA:  53 year old female with chest pain. EXAM: PORTABLE CHEST 1 VIEW COMPARISON:  Chest radiographs 12/01/2020 and earlier. FINDINGS: Portable AP view at 0514 hours. Lung volumes and mediastinal contours are stable and within normal limits. Visualized tracheal air column is within normal limits. Allowing for portable technique the lungs are clear. No pneumothorax or pleural effusion. Chronic right axilla and chest wall surgical clips. No acute osseous abnormality identified. Negative visible bowel gas. IMPRESSION: No acute cardiopulmonary abnormality. Electronically Signed   By: Marlise Simpers M.D.   On: 02/10/2024 05:25   Data Reviewed: Relevant notes from primary care and specialist visits, past discharge summaries as available in EHR, including Care Everywhere. Prior diagnostic testing as pertinent to current admission diagnoses, Updated medications and problem lists for reconciliation ED course, including vitals, labs, imaging, treatment and response to treatment,Triage notes, nursing and pharmacy notes and ED provider's notes Notable results as noted in HPI.Discussed case with EDMD/ ED APP/ or Specialty MD on call and as needed.  Assessment & Plan  An overview patient is a 53 year old African-American female with past medical history of heart disease and breast cancer presenting with chest pain found to have elevated troponins and pneumonia.  >> CAD/NSTEMI: Patient coming in with chest pain has a high risk history of CAD status post PCI and PAD.Cardiology consult. Pt found to have elevated TNI and chest pain similar to her previous presentation.  TNI 243/ 679/ 3rd id pending.  Patient started on heparin  GTT per ACS protocol. As needed nitroglycerin , as needed morphine . Statin therapy.   Beta-blocker therapy contraindicated with heart rate being in the 60s.  >>CAP: Cont coverage with rocephin  and azithromycin . Supplemental oxygen PRN.  >> Gastroduodenitis/peptic ulcer disease: IV PPI.  >> PAD: Continue with aspirin  and statin therapy.PT also has BL renal stenosis nonobstructive.  F/u with vascular. Kidney function is wnl.  >> Stage IIIa CKD: Will continue to monitor and renally dose needed medications. Lab Results  Component Value Date   CREATININE 0.77 02/10/2024   CREATININE 1.01 (H) 10/29/2023   CREATININE 0.94 11/08/2022   >> History of chronic diastolic congestive heart failure: Currently patient is euvolemic no edema no JVD no rales. No need for diuresis currently.  Cardiac diet.  >> Essential hypertension: Vitals:   02/10/24 0845 02/10/24 1015 02/10/24 1045 02/10/24 1100  BP: (!) 153/85 (!) 165/90 (!) 145/80 (!) 157/92   02/10/24 1115 02/10/24 1130 02/10/24 1145 02/10/24 1147  BP: (!) 165/88 (!) 144/83 (!) 159/81 (!) 159/81   02/10/24 1200  02/10/24 1300 02/10/24 1359 02/10/24 1503  BP: (!) 174/92 (!) 179/74 (!) 179/74 (!) 166/95  Will continue patient on lisinopril  20, Aldactone  50 mg at home will give 12.5 mg bid  to allow room for other agents.   DVT prophylaxis:  Heparin  GTT Consults:  Cardiology Advance Care Planning:    Code Status: Full Code   Family Communication:  None Disposition Plan:  Home Severity of Illness: The appropriate patient status for this patient is INPATIENT. Inpatient status is judged to be reasonable and necessary in order to provide the required intensity of service to ensure the patient's safety. The patient's presenting symptoms, physical exam findings, and initial radiographic and laboratory data in the context of their chronic comorbidities is felt to place them at high risk for further clinical deterioration. Furthermore, it is not anticipated that the patient will be medically stable for discharge from the hospital  within 2 midnights of admission.   * I certify that at the point of admission it is my clinical judgment that the patient will require inpatient hospital care spanning beyond 2 midnights from the point of admission due to high intensity of service, high risk for further deterioration and high frequency of surveillance required.*  Unresulted Labs (From admission, onward)     Start     Ordered   02/11/24 0500  Heparin  level (unfractionated)  Daily,   R      02/10/24 0909   02/11/24 0500  CBC  Daily,   R      02/10/24 0909   02/11/24 0500  Comprehensive metabolic panel  Tomorrow morning,   R        02/10/24 1036   02/10/24 1530  Heparin  level (unfractionated)  Once-Timed,   URGENT        02/10/24 0909   02/10/24 1107  HIV Antibody (routine testing w rflx)  (HIV Antibody (Routine testing w reflex) panel)  Once,   R        02/10/24 1108   02/10/24 0942  Ethanol  Add-on,   AD        02/10/24 0943            Orders Placed This Encounter  Procedures   Critical Care   DG Chest Port 1 View   CT Head Wo Contrast   CT Angio Chest/Abd/Pel for Dissection W and/or W/WO   Comprehensive metabolic panel   Lipase, blood   CBC with Differential   Urinalysis, Routine w reflex microscopic -Urine, Clean Catch   Heparin  level (unfractionated)   Heparin  level (unfractionated)   CBC   Ethanol   Comprehensive metabolic panel   Brain natriuretic peptide   Hemoglobin A1c   HIV Antibody (routine testing w rflx)   Lactic acid, plasma   Diet heart healthy/carb modified Room service appropriate? Yes; Fluid consistency: Thin   ED Cardiac monitoring   Refer to Sidebar Report Refer to ICU, Med-Surg, Progressive, and Step-Down Mobility Protocol Sidebars   Apply Angina, Rule Out Myocardial Infarction Care Plan   Vital signs with O2 sat q4 hours x 24 hours, then q shift   Cardiac Monitoring Continuous x 24 hours Indications for use: Other; other indications for use: chest pain   RN may order Cardiology  PRN Orders utilizing "Cardiology PRN medications" (through manage orders) for the following patient needs:   Mobility Protocol: No Restrictions RN to initiate protocols based on patient's level of care   Apply Diabetes Mellitus Care Plan   STAT CBG when hypoglycemia  is suspected. If treated, recheck every 15 minutes after each treatment until CBG >/= 70 mg/dl   Refer to Hypoglycemia Protocol Sidebar Report for treatment of CBG < 70 mg/dl   No HS correction Insulin   Full code   heparin  per pharmacy consult   Inpatient consult to Cardiology   Consult to hospitalist   EKG 12-Lead   ED EKG   EKG 12-Lead (at 6am)   EKG 12-Lead (recurrent chest pain)   ECHOCARDIOGRAM COMPLETE   Saline lock IV   Admit to Inpatient (patient's expected length of stay will be greater than 2 midnights or inpatient only procedure)    Author: Lavanda Porter, MD 12 pm -8 pm. 02/10/2024 3:15 PM >>Please note for any concern,or critical results after hours past 8pm please contact the [facility] hospitalist [facility] floor coverage provider from 7 PM- 7 AM. For on call review [email or web address], username [email or web address] and PW: your phone number<<

## 2024-02-10 NOTE — ED Triage Notes (Signed)
 Patient coming from home with chief complaints of vomiting, diarrhea, and dizziness for about 3 hours. Upon EMS arrival to scene patient was extremely diaphoretic. Patient was initially hypertensive at 220/130. Patient stated to have generalized body aches but started to have central chest pain and right shoulder pain. EMS VS 185/110 BP 226 CBG 70 HR 100% RA Given 4mg  of zofran  by EMS

## 2024-02-10 NOTE — ED Notes (Signed)
 This nurse called CCMD to have patient monitored.

## 2024-02-10 NOTE — ED Notes (Signed)
 Pt to Echo via stretcher. RR even and unlabored.

## 2024-02-10 NOTE — ED Notes (Signed)
 Patient returned from CT

## 2024-02-10 NOTE — Plan of Care (Signed)

## 2024-02-10 NOTE — Progress Notes (Signed)
 PHARMACY - ANTICOAGULATION CONSULT NOTE  Pharmacy Consult:  Heparin  Indication: chest pain/ACS  Allergies  Allergen Reactions   Pork-Derived Products Other (See Comments)    Does NOT eat pork    Patient Measurements: Weight: 60.1 kg (132 lb 7.9 oz) Heparin  dosing weight = 60 kg  Vital Signs: Temp: 97.9 F (36.6 C) (05/04 0500) Temp Source: Oral (05/04 0500) BP: 141/89 (05/04 0745) Pulse Rate: 61 (05/04 0745)  Labs: Recent Labs    02/10/24 0504  HGB 14.4  HCT 40.3  PLT 393  CREATININE 0.77  TROPONINIHS 243*    Estimated Creatinine Clearance: 70.1 mL/min (by C-G formula based on SCr of 0.77 mg/dL).   Medical History: Past Medical History:  Diagnosis Date   Anemia    Anxiety    Bipolar disorder in full remission (HCC)    CAD in native artery cardiologist--  dr hilty   a. 09/2014 Cath/PCI in setting of [place]  - s/p 2.25 x 12 mm Promus Premier DES to mid RCA;  b. 11/2016 Myoview : EF 56%, no ischemia;  c. 12/2016 NSTEMI/PCI: LM nl, LAd 25p, LCX 30p, RCA 100p (2.75x38 Promus Premier DES), mRCA 10 ISR, EF 50-55%.   CKD (chronic kidney disease), stage III (HCC)    Depression    Genetic testing 04/23/2017   Ms. Mclane underwent genetic counseling and testing for hereditary cancer syndromes on 04/05/2017. Her results were negative for mutations in all 46 genes analyzed by [facility]'s 46-gene Common Hereditary Cancers Panel. Genes analyzed include: APC, ATM, AXIN2, BARD1, BMPR1A, BRCA1, BRCA2, BRIP1, CDH1, CDKN2A, CHEK2, CTNNA1, DICER1, EPCAM, GREM1, HOXB13, KIT, MEN1, MLH1, MSH2, MSH3, MSH6, MUTYH, NBN,   GERD (gastroesophageal reflux disease)    Headache    History of external beam radiation therapy    right breast 07-27-2017  to 09-25-2017   History of non-ST elevation myocardial infarction (NSTEMI)    HOCM (hypertrophic obstructive cardiomyopathy) (HCC) followed by cardiology   a. 12/2016 Echo: EF 65-70%,  mod conc LVH, dynamic obstruction @ rest, peak velocity of 291 cm/sec w/  peak gradient of , no rwma, Gr1 DD, triv TR, PASP .   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  Troponin also elevated and Pharmacy consulted to dose IV heparin  for ACS.  No AC PTA.  CBC stable.  Patient agreeable to IV heparin  although refuses pork product.  Goal of Therapy:  Heparin  level 0.3-0.7 units/ml Monitor platelets by anticoagulation protocol: Yes   Plan:  Heparin  3000 units IV bolus, then Heparin  gtt at 750 units/hr Check 6 hr heparin  level Daily heparin  level and CBC  Anna Henson, PharmD, BCPS, BCCCP 02/10/2024, 9:09 AM

## 2024-02-10 NOTE — ED Provider Notes (Signed)
 [facility] AT [facility] Provider Note   CSN: [id number] Arrival date & time: 02/10/24  0450     History  Chief Complaint  Patient presents with   Emesis    Anna Henson is a 53 y.o. female.  The history is provided by the patient, the EMS personnel and medical records.  Emesis Anna Henson is a 53 y.o. female who presents to the Emergency Department complaining of vomiting.  She presents to the emergency department by EMS for evaluation of vomiting and diarrhea that started several hours prior to ED arrival.  She did have some associated dizziness that started after her vomiting began but this is now resolving.  For EMS that she was diaphoretic.  They also report hypertension to the 200s.  Her symptoms did significantly improve after ondansetron  administration prior to ED arrival.  She does complain of slight waxing waning headache.  She developed some sharp central chest pain that is nonradiating and route to the emergency department.  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     Home Medications Prior to Admission medications   Medication Sig Start Date End Date Taking? Authorizing Provider  amLODipine  (NORVASC ) 10 MG tablet TAKE 1 TABLET BY MOUTH EVERY DAY 11/20/23   Hilty, Aviva Lemmings, MD  anastrozole  (ARIMIDEX ) 1 MG tablet TAKE 1 TABLET(1 MG) BY MOUTH DAILY 01/07/24   Cameron Cea, MD  aspirin  81 MG chewable tablet Chew 1 tablet (81 mg total) by mouth daily. 09/16/14   Samtani, Jai-Gurmukh, MD  atorvastatin  (LIPITOR ) 80 MG tablet Take 1 tablet (80 mg total) by mouth daily. 11/05/23   Hilty, Aviva Lemmings, MD  baclofen  (LIORESAL ) 10 MG tablet TAKE 1 TABLET BY MOUTH 4 TIMES DAILY 09/28/22   Sater, Sherida Dimmer, MD  diphenhydrAMINE  (BENADRYL  ALLERGY) 25 MG tablet Take 1 tablet (25 mg total) by mouth every 6  (six) hours for 3 days. Patient not taking: Reported on 01/01/2023 05/30/22 08/15/22  Eloise Hake Scales, PA-C  Evolocumab  (REPATHA  SURECLICK) 140 MG/ML SOAJ Inject 140 mg into the skin every 14 (fourteen) days. 11/05/23   Hilty, Aviva Lemmings, MD  fluconazole  (DIFLUCAN ) 150 MG tablet Take 1 tablet (150 mg total) by mouth every 3 (three) days. 01/06/24   Starlene Eaton, FNP  fluticasone  (FLONASE ) 50 MCG/ACT nasal spray Place 1-2 sprays into both nostrils daily as needed for allergies or rhinitis.    [provider]  furosemide  (LASIX ) 20 MG tablet TAKE 1 TABLET EVERY DAY FOR FLUID RETENTION 09/26/21   Hilty, Aviva Lemmings, MD  gabapentin  (NEURONTIN ) 300 MG capsule Take 3 capsules (900 MG) by mouth three times daily 12/12/23   Sater, Sherida Dimmer, MD  Isopropyl Alcohol (SWIMMERS EAR DROPS OT) Place 2 drops into both ears daily as needed (water  in ears).    [provider]  lisinopril  (ZESTRIL ) 20 MG tablet Take 1 tablet (20 mg total) by mouth 2 (two) times daily. 11/05/23   Hilty, Aviva Lemmings, MD  methocarbamol  (ROBAXIN ) 500 MG tablet TAKE 1 TABLET(500 MG) BY MOUTH THREE TIMES DAILY AS NEEDED FOR MUSCLE SPASMS 01/07/24   Sater, Sherida Dimmer, MD  nitroGLYCERIN  (NITROSTAT ) 0.4 MG SL tablet PLACE 1 TABLET UNDER THE TONGUE EVERY 5 MINUTES AS NEEDED FOR CHEST PAIN 07/15/21   Hazle Lites, MD  oxcarbazepine  (TRILEPTAL ) 600 MG tablet Take 1 tablet (600 mg total)  by mouth 3 (three) times daily. 12/12/23   Sater, Sherida Dimmer, MD  Probiotic Product (PROBIOTIC-10 PO) Take 1 capsule by mouth daily.    [provider]  spironolactone  (ALDACTONE ) 50 MG tablet Take 1 tablet (50 mg total) by mouth daily. Take 1 tablet each morning and 1/2 tablet each evening. 08/15/23   Hilty, Aviva Lemmings, MD  traMADol  (ULTRAM ) 50 MG tablet TAKE 1 TABLET(50 MG) BY MOUTH UP TO THREE TIMES A DAILY AS NEEDED 12/12/23   Sater, Sherida Dimmer, MD  venlafaxine  XR (EFFEXOR -XR) 75 MG 24 hr capsule TAKE 1 CAPSULE BY MOUTH DAILY WITH  BREAKFAST 02/05/24   Cameron Cea, MD      Allergies    Pork-derived products    Review of Systems   Review of Systems  Gastrointestinal:  Positive for vomiting.  All other systems reviewed and are negative.   Physical Exam Updated Vital Signs BP (!) 172/93   Pulse 62   Temp 97.9 F (36.6 C) (Oral)   Resp 13   LMP 03/06/2020   SpO2 100%  Physical Exam Vitals and nursing note reviewed.  Constitutional:      Appearance: She is well-developed.  HENT:     Head: Normocephalic and atraumatic.  Cardiovascular:     Rate and Rhythm: Normal rate and regular rhythm.     Heart sounds: No murmur heard. Pulmonary:     Effort: Pulmonary effort is normal. No respiratory distress.     Breath sounds: Normal breath sounds.  Abdominal:     Palpations: Abdomen is soft.     Tenderness: There is no abdominal tenderness. There is no guarding or rebound.  Musculoskeletal:        General: No tenderness.     Comments: 2+ DP pulses bilaterally  Skin:    General: Skin is warm and dry.  Neurological:     Mental Status: She is alert and oriented to person, place, and time.     Comments: No asymmetry of facial movements.  5 out of 5 strength in all 4 extremities  Psychiatric:        Behavior: Behavior normal.     ED Results / Procedures / Treatments   Labs (all labs ordered are listed, but only abnormal results are displayed) Labs Reviewed  COMPREHENSIVE METABOLIC PANEL WITH GFR - Abnormal; Notable for the following components:      Result Value   Sodium 132 (*)    CO2 21 (*)    Glucose, Bld 188 (*)    All other components within normal limits  CBC WITH DIFFERENTIAL/PLATELET - Abnormal; Notable for the following components:   WBC 26.4 (*)    Neutro Abs 21.4 (*)    Monocytes Absolute 1.1 (*)    Abs Immature Granulocytes 0.19 (*)    All other components within normal limits  URINALYSIS, ROUTINE W REFLEX MICROSCOPIC - Abnormal; Notable for the following components:   Color, Urine STRAW  (*)    Glucose, UA >=500 (*)    Protein, ur >=300 (*)    All other components within normal limits  TROPONIN I (HIGH SENSITIVITY) - Abnormal; Notable for the following components:   Troponin I (High Sensitivity) 243 (*)    All other components within normal limits  LIPASE, BLOOD  TROPONIN I (HIGH SENSITIVITY)    EKG EKG Interpretation Date/Time:  Sunday Feb 10 2024 05:00:15 EDT Ventricular Rate:  69 PR Interval:  158 QRS Duration:  82 QT Interval:  432 QTC Calculation: 463 R Axis:  68  Text Interpretation: Sinus rhythm LAE, consider biatrial enlargement Probable left ventricular hypertrophy Confirmed by Kelsey Patricia [phone]) on 02/10/2024 5:10:11 AM  Radiology DG Chest Port 1 View Result Date: 02/10/2024 CLINICAL DATA:  53 year old female with chest pain. EXAM: PORTABLE CHEST 1 VIEW COMPARISON:  Chest radiographs 12/01/2020 and earlier. FINDINGS: Portable AP view at 0514 hours. Lung volumes and mediastinal contours are stable and within normal limits. Visualized tracheal air column is within normal limits. Allowing for portable technique the lungs are clear. No pneumothorax or pleural effusion. Chronic right axilla and chest wall surgical clips. No acute osseous abnormality identified. Negative visible bowel gas. IMPRESSION: No acute cardiopulmonary abnormality. Electronically Signed   By: Marlise Simpers M.D.   On: 02/10/2024 05:25    Procedures Procedures    Medications Ordered in ED Medications  metoCLOPramide  (REGLAN ) injection 10 mg (10 mg Intravenous Given 02/10/24 0519)  fentaNYL  (SUBLIMAZE ) injection 50 mcg (50 mcg Intravenous Given 02/10/24 0538)  iohexol  (OMNIPAQUE ) 350 MG/ML injection 75 mL (75 mLs Intravenous Contrast Given 02/10/24 0865)    ED Course/ Medical Decision Making/ A&P                                 Medical Decision Making Amount and/or Complexity of Data Reviewed Labs: ordered. Radiology: ordered.  Risk Prescription drug management.   Patient with  history of coronary artery disease, breast cancer in remission, transverse myelitis, hypertension here for evaluation of dizziness, vomiting, diarrhea, chest pain.  EKG is nonischemic.  She was significantly hypertensive for EMS, blood pressures are downtrending without intervention.  No focal deficits on examination.  Troponin is elevated at 243.  Pending CT head, CTA dissection to rule out subarachnoid hemorrhage, dissection given her various complaints.  Patient care transferred pending additional imaging.        Final Clinical Impression(s) / ED Diagnoses Final diagnoses:  None    Rx / DC Orders ED Discharge Orders     None         Kelsey Patricia, MD 02/10/24 [phone]

## 2024-02-10 NOTE — ED Provider Notes (Signed)
  Physical Exam  BP (!) 153/85   Pulse 65   Temp 97.8 F (36.6 C)   Resp 15   Wt 60.1 kg   LMP 03/06/2020   SpO2 100%   BMI 24.63 kg/m   Physical Exam  Procedures  .Critical Care  Performed by: Auston Blush, MD Authorized by: Auston Blush, MD   Critical care provider statement:    Critical care time (minutes):  50   Critical care end time:  02/10/2024 9:42 AM   Critical care was necessary to treat or prevent imminent or life-threatening deterioration of the following conditions:  Cardiac failure   Critical care was time spent personally by me on the following activities:  Development of treatment plan with patient or surrogate, discussions with consultants, evaluation of patient's response to treatment, examination of patient, ordering and review of laboratory studies, ordering and review of radiographic studies, ordering and performing treatments and interventions, pulse oximetry, re-evaluation of patient's condition and review of old charts   ED Course / MDM   Clinical Course as of 02/10/24 0942  Sun Feb 10, 2024  0757 Reviewed CTA shows no evidence of aortic aneurysm or dissection with advanced aortic coronary artery atherosclerosis, right lower lobe bronchopneumonia, multiple new groundglass nodules in the right lung that will need 34-month follow-up study recommend as patient has history of breast cancer, interval new 50% soft plaque origin stenosis celiac artery, interval new 70% soft plaque focal stenosis of proximal right renal artery, stable stenosis of the left renal artery, interval stenting of the left common iliac and proximal left external iliac artery + [DR]  0758 CT head without acute abnormalities [DR]    Clinical Course User Index [DR] Auston Blush, MD   Medical Decision Making Amount and/or Complexity of Data Reviewed Labs: ordered. Radiology: ordered.  Risk OTC drugs. Prescription drug management.   53 yo female ho cad, s/p stent, breast ca,  transverse myelitis, chronic pain, htn here with dizziness and vomiting with diaphoresis and chest pain with ems.  Dizziness was light headed and bp 200s prehospital. Here bp 130- here cc lightheaded with chest pain Trop up at 200 no ischemia EKG Wbc 26000 CT no bleed No focal deficits Chest pain- fentanyl - will give asa nitroglycerin , consider heparin  After ct chest read Plan admission with cardiology consult  9:42 AM CT scan.  Multiple and worsening stenoses noted in great vessels and renal vessels. Patient has 5 out of 10 left-sided chest pain that she identifies as sharp but similar to prior cardiac pain. Nitro, aspirin , and heparin  ordered Also will give Rocephin  and Zithromax  for probable infiltrate in lungs.   9:42 AM Discussed with Dr. Lavonne Prairie and cardiology will see in consultation here in ED Care discussed with Dr. Lydia Sams, on-call for hospitalist who will see for admission      Auston Blush, MD 02/10/24 [phone]

## 2024-02-10 NOTE — Progress Notes (Signed)
  Echocardiogram 2D Echocardiogram has been performed.  Anna Henson 02/10/2024, 2:49 PM

## 2024-02-10 NOTE — Progress Notes (Signed)
 PHARMACY - ANTICOAGULATION CONSULT NOTE  Pharmacy Consult:  Heparin  Indication: chest pain/ACS  Allergies  Allergen Reactions   Pork-Derived Products Other (See Comments)    Does NOT eat pork    Patient Measurements: Height: 5\' 1"  (154.9 cm) Weight: 59.8 kg (131 lb 13.4 oz) IBW/kg (Calculated) : 47.8 HEPARIN  DW (KG): 59.8 Heparin  dosing weight = 60 kg  Vital Signs: Temp: 98.4 F (36.9 C) (05/04 1529) Temp Source: Oral (05/04 1529) BP: 176/94 (05/04 1529) Pulse Rate: 60 (05/04 1529)  Labs: Recent Labs    02/10/24 0504 02/10/24 0721 02/10/24 1345 02/10/24 1550  HGB 14.4  --   --   --   HCT 40.3  --   --   --   PLT 393  --   --   --   HEPARINUNFRC  --   --   --  0.24*  CREATININE 0.77  --   --   --   TROPONINIHS 243* 679* 498*  --     Estimated Creatinine Clearance: 69.1 mL/min (by C-G formula based on SCr of 0.77 mg/dL).   Medical History: Past Medical History:  Diagnosis Date   Anemia    Anxiety    Bipolar disorder in full remission (HCC)    CAD in native artery cardiologist--  dr hilty   a. 09/2014 Cath/PCI in setting of [place]  - s/p 2.25 x 12 mm Promus Premier DES to mid RCA;  b. 11/2016 Myoview : EF 56%, no ischemia;  c. 12/2016 NSTEMI/PCI: LM nl, LAd 25p, LCX 30p, RCA 100p (2.75x38 Promus Premier DES), mRCA 10 ISR, EF 50-55%.   CKD (chronic kidney disease), stage III (HCC)    Depression    Genetic testing 04/23/2017   Ms. Sorrels underwent genetic counseling and testing for hereditary cancer syndromes on 04/05/2017. Her results were negative for mutations in all 46 genes analyzed by [facility]'s 46-gene Common Hereditary Cancers Panel. Genes analyzed include: APC, ATM, AXIN2, BARD1, BMPR1A, BRCA1, BRCA2, BRIP1, CDH1, CDKN2A, CHEK2, CTNNA1, DICER1, EPCAM, GREM1, HOXB13, KIT, MEN1, MLH1, MSH2, MSH3, MSH6, MUTYH, NBN,   GERD (gastroesophageal reflux disease)    Headache    History of external beam radiation therapy    right breast 07-27-2017  to 09-25-2017   History of  non-ST elevation myocardial infarction (NSTEMI)    HOCM (hypertrophic obstructive cardiomyopathy) (HCC) followed by cardiology   a. 12/2016 Echo: EF 65-70%,  mod conc LVH, dynamic obstruction @ rest, peak velocity of 291 cm/sec w/ peak gradient of , no rwma, Gr1 DD, triv TR, PASP .   Hyperlipidemia    Hypertension    Malignant neoplasm of lower-outer quadrant of right breast of female, estrogen receptor positive [facility]) oncologist--- dr Lee Public   dx 06/ 2018--- right breast invasive lobular carcinoma, ductal carcinoma w/ LCIS---- 06-21-2017 s/p right breast lumpecotmy w/ sln disseciton;   completed radiation 09-25-2017;  on tamoxifen    Myocardial infarction (HCC)    2017   S/P drug eluting coronary stent placement    09-14-2014---  PCI with DES x1 to midRCA;  12-31-2016  PCI with DES x1 to proxRCA   Transverse myelitis [facility]) neurologist--- dr Godwin Lat   01/ 2017  dx transverse myelitis w/ right side numbness (09-01-2019  currently lower extremity weakness, mucsle spasms, and gait disturbance)   Wears glasses      Assessment: 52 YOF presented with elevated BP, vomiting, diarrhea and dizziness.  Troponin also elevated and Pharmacy consulted to dose IV heparin  for ACS.  No AC PTA.  CBC stable.  Patient agreeable to IV heparin  although refuses pork product.  Initial heparin  level 0.24 subtherapeutic, no issues or overt s/sx of bleeding reported by RN  Goal of Therapy:  Heparin  level 0.3-0.7 units/ml Monitor platelets by anticoagulation protocol: Yes   Plan:  Increase heparin  gtt to 900 units/hr Check 8hour heparin  level Daily heparin  level and CBC  Mohammed Andrew, PharmD Clinical Pharmacist 02/10/2024 4:36 PM Please check AMION for all [facility] numbers

## 2024-02-10 NOTE — Consult Note (Addendum)
 Cardiology Consultation   Patient ID: Anna Henson MRN: [id number]; DOB: 1970-11-29  Admit date: 02/10/2024 Date of Consult: 02/10/2024  PCP:  Patient, No Pcp Per   [facility] Providers Cardiologist:  Hazle Lites, MD   {   Patient Profile:   Anna Henson is a 53 y.o. female with a hx of CAD (s/p RCA PCI 2015 & overlapping DES to previous placed RCA 2018), HTN, HLD, PAD s/p  peripheral angiography 08/11/2021, breast cancer in remission, transverse myelitis with chronic spine pain since 2017  who is being seen 02/10/2024 for the evaluation of chest pain at the request of Dr. Synetta Eves.  History of Present Illness:   Anna Henson was last seen in virtual visit 08/2023 with Dr. Maximo Spar with concerns about elevated BP. Restarted on spironolactone  50 mg and continued amlodipine  and lisinopril . Also d/c her repatha  due to cost issues and continued Lipitor .    Presented to the ED today for evaluation of vomiting, diarrhea, dizziness, and chest pain. Per EMS, she was diaphoretic and hypertensive to 200's.  Also, EMS treated with ondansetron , which improved symptoms. Na 132, K 3.6, Cr 0.77, Alk Phos 102, Lipase 28, AST 28, ALT 21, WBC 26.4, Tn 243 > 679. EKG NSR, HR 68, LVH, T wave more symmetrical and peaked in inferolateral compared to previous. CXR, CT head  with no acute findings. CTA with aortic and coronary arterty atherosclerosis, Right lower lobe bronchopneumonia,  new ground glass nodule, and bilateral renal artery stenosis.  ED treated with IV reglan , IV fentanyl , IV heparin , abx, ASA and NTG x 2  During interview, CP started last night while laying in bed after 6 hours, constant for 4 hours until given NTG x2 in ED, stabbing, 5/10, located centrally, less severe than previous MI, no radiation, nonpositonal, nonpleurtic. Started vomiting x4 shortly after CP. Also associated with Diaphoresis and Dizziness. No recent prior episodes of CP. Not very active due to transverse myelitis. Endorses  SOB when trying to do yard work over the past week. Denies any orthopnea, PND, edema, syncope, and weight changes.    Quit smoking approx 7 years ago with 15 pack year history. Denies binge drinking and drug use.  Family history includes mother (MI, heart failure). Reports compliance with medications.   Past Medical History:  Diagnosis Date   Anemia    Anxiety    Bipolar disorder in full remission (HCC)    CAD in native artery cardiologist--  dr hilty   a. 09/2014 [place]/PCI in setting of [place]  - s/p 2.25 x 12 mm Promus Premier DES to mid RCA;  b. 11/2016 Myoview : EF 56%, no ischemia;  c. 12/2016 NSTEMI/PCI: LM nl, LAd 25p, LCX 30p, RCA 100p (2.75x38 Promus Premier DES), mRCA 10 ISR, EF 50-55%.   CKD (chronic kidney disease), stage III (HCC)    Depression    Genetic testing 04/23/2017   Anna Henson underwent genetic counseling and testing for hereditary cancer syndromes on 04/05/2017. Her results were negative for mutations in all 46 genes analyzed by [facility]'s 46-gene Common Hereditary Cancers Panel. Genes analyzed include: APC, ATM, AXIN2, BARD1, BMPR1A, BRCA1, BRCA2, BRIP1, CDH1, CDKN2A, CHEK2, CTNNA1, DICER1, EPCAM, GREM1, HOXB13, KIT, MEN1, MLH1, MSH2, MSH3, MSH6, MUTYH, NBN,   GERD (gastroesophageal reflux disease)    Headache    History of external beam radiation therapy    right breast 07-27-2017  to 09-25-2017   History of non-ST elevation myocardial infarction (NSTEMI)    HOCM (hypertrophic obstructive cardiomyopathy) (HCC) followed by  cardiology   a. 12/2016 Echo: EF 65-70%,  mod conc LVH, dynamic obstruction @ rest, peak velocity of 291 cm/sec w/ peak gradient of , no rwma, Gr1 DD, triv TR, PASP .   Hyperlipidemia    Hypertension    Malignant neoplasm of lower-outer quadrant of right breast of female, estrogen receptor positive [facility]) oncologist--- dr Lee Public   dx 06/ 2018--- right breast invasive lobular carcinoma, ductal carcinoma w/ LCIS---- 06-21-2017 s/p right breast  lumpecotmy w/ sln disseciton;   completed radiation 09-25-2017;  on tamoxifen    Myocardial infarction [facility]    2017   S/P drug eluting coronary stent placement    09-14-2014---  PCI with DES x1 to midRCA;  12-31-2016  PCI with DES x1 to proxRCA   Transverse myelitis [facility]) neurologist--- dr Godwin Lat   01/ 2017  dx transverse myelitis w/ right side numbness (09-01-2019  currently lower extremity weakness, mucsle spasms, and gait disturbance)   Wears glasses     Past Surgical History:  Procedure Laterality Date   ABDOMINAL AORTOGRAM W/LOWER EXTREMITY N/A 08/11/2021   Procedure: ABDOMINAL AORTOGRAM W/LOWER EXTREMITY;  Surgeon: Avanell Leigh, MD;  Location: [facility] INVASIVE CV LAB;  Service: Cardiovascular;  Laterality: N/A;   BREAST LUMPECTOMY WITH RADIOACTIVE SEED AND SENTINEL LYMPH NODE BIOPSY Right 06/21/2017   Procedure: RIGHT BREAST BRACKETED SEED GUIDED LUMPECTOMY AND SENTINEL LYMPH NODE BIOPSY;  Surgeon: Caralyn Chandler, MD;  Location: [facility] OR;  Service: General;  Laterality: Right;   CORONARY/GRAFT ACUTE MI REVASCULARIZATION N/A 12/31/2016   Procedure: Coronary/Graft Acute MI Revascularization;  Surgeon: Arnoldo Lapping, MD;  Location: [facility] INVASIVE CV LAB;  Service: Cardiovascular;  Laterality: N/A;   DILATATION & CURETTAGE/HYSTEROSCOPY WITH MYOSURE N/A 09/02/2019   Procedure: DILATATION & CURETTAGE/HYSTEROSCOPY WITH MYOSURE;  Surgeon: Audelia Leaks, MD;  Location: [facility] [place];  Service: Gynecology;  Laterality: N/A;   HERNIA REPAIR     LEFT HEART CATH AND CORONARY ANGIOGRAPHY N/A 12/31/2016   Procedure: Left Heart Cath and Coronary Angiography;  Surgeon: Arnoldo Lapping, MD;  Location: [facility] INVASIVE CV LAB;  Service: Cardiovascular;  Laterality: N/A;   LEFT HEART CATHETERIZATION WITH CORONARY ANGIOGRAM N/A 09/14/2014   Procedure: LEFT HEART CATHETERIZATION WITH CORONARY ANGIOGRAM;  Surgeon: Odie Benne, MD;  Location: [facility] CATH LAB;  Service: Cardiovascular;  Laterality: N/A;    MASS EXCISION N/A 03/30/2020   Procedure: EXCISION MASS RIGHT LABIA MINORA;  Surgeon: Suzi Essex, MD;  Location: [facility] ORS;  Service: Gynecology;  Laterality: N/A;   PERCUTANEOUS CORONARY STENT INTERVENTION (PCI-S)  09/14/2014   Procedure: PERCUTANEOUS CORONARY STENT INTERVENTION (PCI-S);  Surgeon: Odie Benne, MD;  Location: [facility] CATH LAB;  Service: Cardiovascular;;   PERIPHERAL VASCULAR INTERVENTION  08/11/2021   Procedure: PERIPHERAL VASCULAR INTERVENTION;  Surgeon: Avanell Leigh, MD;  Location: [facility] INVASIVE CV LAB;  Service: Cardiovascular;;   ROBOTIC ASSISTED SALPINGO OOPHERECTOMY Bilateral 03/30/2020   Procedure: XI ROBOTIC ASSISTED SALPINGO OOPHORECTOMY;  Surgeon: Suzi Essex, MD;  Location: [facility] ORS;  Service: Gynecology;  Laterality: Bilateral;   TONSILLECTOMY  2005   UMBILICAL HERNIA REPAIR  2001; 2005     Home Medications:  Prior to Admission medications   Medication Sig Start Date End Date Taking? Authorizing Provider  amLODipine  (NORVASC ) 10 MG tablet TAKE 1 TABLET BY MOUTH EVERY DAY 11/20/23  Yes Hilty, Aviva Lemmings, MD  anastrozole  (ARIMIDEX ) 1 MG tablet TAKE 1 TABLET(1 MG) BY MOUTH DAILY Patient taking differently: Take 1 mg by mouth daily. 01/07/24  Yes Gudena, Vinay,  MD  aspirin  81 MG chewable tablet Chew 1 tablet (81 mg total) by mouth daily. 09/16/14  Yes Samtani, Jai-Gurmukh, MD  atorvastatin  (LIPITOR ) 80 MG tablet Take 1 tablet (80 mg total) by mouth daily. 11/05/23  Yes Hilty, Aviva Lemmings, MD  Evolocumab  (REPATHA  SURECLICK) 140 MG/ML SOAJ Inject 140 mg into the skin every 14 (fourteen) days. 11/05/23  Yes Hilty, Aviva Lemmings, MD  fluticasone  (FLONASE ) 50 MCG/ACT nasal spray Place 1-2 sprays into both nostrils daily as needed for allergies or rhinitis.   Yes [provider]  gabapentin  (NEURONTIN ) 300 MG capsule Take 3 capsules (900 MG) by mouth three times daily 12/12/23  Yes Sater, Sherida Dimmer, MD  lisinopril  (ZESTRIL ) 20 MG tablet Take 1 tablet (20 mg  total) by mouth 2 (two) times daily. 11/05/23  Yes Hilty, Aviva Lemmings, MD  methocarbamol  (ROBAXIN ) 500 MG tablet TAKE 1 TABLET(500 MG) BY MOUTH THREE TIMES DAILY AS NEEDED FOR MUSCLE SPASMS Patient taking differently: Take 500 mg by mouth 3 (three) times daily as needed for muscle spasms. 01/07/24  Yes Sater, Sherida Dimmer, MD  nitroGLYCERIN  (NITROSTAT ) 0.4 MG SL tablet PLACE 1 TABLET UNDER THE TONGUE EVERY 5 MINUTES AS NEEDED FOR CHEST PAIN Patient taking differently: Place 0.4 mg under the tongue every 5 (five) minutes as needed for chest pain. 07/15/21  Yes Hilty, Aviva Lemmings, MD  oxcarbazepine  (TRILEPTAL ) 600 MG tablet Take 1 tablet (600 mg total) by mouth 3 (three) times daily. 12/12/23  Yes Sater, Sherida Dimmer, MD  spironolactone  (ALDACTONE ) 50 MG tablet Take 1 tablet (50 mg total) by mouth daily. Take 1 tablet each morning and 1/2 tablet each evening. Patient taking differently: Take 50 mg by mouth daily. 08/15/23  Yes Hilty, Aviva Lemmings, MD  traMADol  (ULTRAM ) 50 MG tablet TAKE 1 TABLET(50 MG) BY MOUTH UP TO THREE TIMES A DAILY AS NEEDED Patient taking differently: Take 50 mg by mouth 3 (three) times daily as needed for moderate pain (pain score 4-6) or severe pain (pain score 7-10). 12/12/23  Yes Sater, Sherida Dimmer, MD  venlafaxine  XR (EFFEXOR -XR) 75 MG 24 hr capsule TAKE 1 CAPSULE BY MOUTH DAILY WITH BREAKFAST 02/05/24  Yes Gudena, Vinay, MD  furosemide  (LASIX ) 20 MG tablet TAKE 1 TABLET EVERY DAY FOR FLUID RETENTION Patient not taking: Reported on 02/10/2024 09/26/21   Hazle Lites, MD    Inpatient Medications: Scheduled Meds:  anastrozole   1 mg Oral Daily   [START ON 02/11/2024] aspirin   81 mg Oral Daily   atorvastatin   80 mg Oral Daily   gabapentin   900 mg Oral TID   lisinopril   20 mg Oral BID   oxcarbazepine   600 mg Oral TID   spironolactone   12.5 mg Oral Daily   venlafaxine  XR  75 mg Oral Q breakfast   Continuous Infusions:  [START ON 02/11/2024] azithromycin      [START ON 02/11/2024] cefTRIAXone   (ROCEPHIN )  IV     heparin  750 Units/hr (02/10/24 0953)   PRN Meds: acetaminophen , fluticasone , morphine  injection, nitroGLYCERIN , ondansetron  (ZOFRAN ) IV  Allergies:    Allergies  Allergen Reactions   Pork-Derived Products Other (See Comments)    Does NOT eat pork    Social History:   Social History   Socioeconomic History   Marital status: Single    Spouse name: Not on file   Number of children: 0   Years of education: masters   Highest education level: Not on file  Occupational History   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   Drug use: No   Sexual activity: Not Currently  Other Topics Concern   Not on file  Social History Narrative   Consumes 2 cups of caffeine daily   Social Drivers of Health   Financial Resource Strain: Medium Risk (08/16/2017)   Overall Financial Resource Strain (CARDIA)    Difficulty of Paying Living Expenses: Somewhat hard  Food Insecurity: Food Insecurity Present (08/16/2017)   Hunger Vital Sign    Worried About Running Out of Food in the Last Year: Often true    Ran Out of Food in the Last Year: Often true  Transportation Needs: No Transportation Needs (08/16/2017)   PRAPARE - Administrator, Civil Service (Medical): No    Lack of Transportation (Non-Medical): No  Physical Activity: Insufficiently Active (08/16/2017)   Exercise Vital Sign    Days of Exercise per Week: 1 day    Minutes of Exercise per Session: 10 min  Stress: Stress Concern  Present (08/16/2017)   [employer] of Occupational Health - Occupational Stress Questionnaire    Feeling of Stress : Very much  Social Connections: Unknown (02/20/2022)   [facility]   Social Network    Social Network: Not on file  Intimate Partner Violence: Unknown (01/12/2022)   Received from [facility]   HITS    Physically Hurt: Not on file    Insult or Talk Down To: Not on file    Threaten Physical Harm: Not on file    Scream or Curse: Not on file    Family History:   Family History  Problem Relation Age of Onset   Diabetes Mother    Heart disease Mother    Hyperlipidemia Mother    Hypertension Mother    Breast cancer Mother    Lung cancer Father    Drug abuse Father    Brain cancer Father 68   Bone cancer Father 77   Drug abuse Sister    Mental illness Brother    Mental illness Maternal Grandmother    Stomach cancer Paternal Grandmother 63       d.75     ROS:  Please see the history of present illness.  All other ROS reviewed and negative.     Physical Exam/Data:   Vitals:   02/10/24 1015 02/10/24 1045 02/10/24 1100 02/10/24 1115  BP: (!) 165/90 (!) 145/80 (!) 157/92 (!) 165/88  Pulse: 65 68 86 60  Resp: 15 11  15   Temp:      TempSrc:      SpO2: 100% 100% 100% 100%  Weight:        Intake/Output Summary (Last 24 hours) at 02/10/2024 1139 Last data filed at 02/10/2024 0955 Gross per 24 hour  Intake 351.16 ml  Output --  Net 351.16 ml      02/10/2024    8:10 AM 01/14/2024   12:19  PM 12/12/2023   10:56 AM  Last 3 Weights  Weight (lbs) 132 lb 7.9 oz 132 lb 8 oz 135 lb 6.4 oz  Weight (kg) 60.1 kg 60.102 kg 61.417 kg     Body mass index is 24.63 kg/m.  General:  Well nourished, well developed, in no acute distress HEENT: normal Neck: no JVD Vascular: No carotid bruits; Distal pulses 2+ bilaterally Cardiac:  normal S1, S2; RRR; no murmur  Lungs:  clear to auscultation bilaterally, no wheezing, rhonchi or  rales  Abd: soft, nontender, no hepatomegaly  Ext: no edema Musculoskeletal:  No deformities, BUE and BLE strength normal and equal Skin: warm and dry  Neuro:  CNs 2-12 intact, no focal abnormalities noted Psych:  Normal affect   EKG:  The EKG was personally reviewed and demonstrates:  NSR, HR 68, LVH, T wave more symmetrical and peaked in inferolateral compared to previous. Telemetry:  Telemetry was personally reviewed and demonstrates:  NSR, HR 60-80's  Relevant CV Studies: Lexiscan  11/2020 The left ventricular ejection fraction is normal (55-65%). Nuclear stress EF: 61%. There was no ST segment deviation noted during stress. The study is normal. This is a low risk study. Low risk stress nuclear study with normal perfusion and normal left ventricular regional and global systolic function.  ECHO IMPRESSIONS 10/2020  1. Left ventricular ejection fraction, by estimation, is 60 to 65%. The  left ventricle has normal function. The left ventricle has no regional  wall motion abnormalities. Left ventricular diastolic parameters are  consistent with Grade II diastolic  dysfunction (pseudonormalization).   2. Right ventricular systolic function is normal. The right ventricular  size is normal. Tricuspid regurgitation signal is inadequate for assessing  PA pressure.   3. The mitral valve is normal in structure. Trivial mitral valve  regurgitation. No evidence of mitral stenosis.   4. The aortic valve is tricuspid. Aortic valve regurgitation is not  visualized. No aortic stenosis is present.   5. The inferior vena cava is normal in size with greater than 50%  respiratory variability, suggesting right atrial pressure of 3 mmHg.     Cath 12/2016 1. Acute total occlusion of the RCA, treated successful with PCI using a 2.75x38 mm Promus DES, deployed in overlapping fashion with the old stent which was widely patent 2. Mild nonobstructive LAD/LCx stenosis 3. Mild segmental LV systolic  dysfunction with severe hypokinesis of the basal and mid inferior walls, but vigorous contractility of the anterior and apical walls, LVEF estimated 50-55%   Recommend: ? DAPT with ASA and brilinta  at least 12 months without interruption   Laboratory Data:  High Sensitivity Troponin:   Recent Labs  Lab 02/10/24 0504 02/10/24 0721  TROPONINIHS 243* 679*     Chemistry Recent Labs  Lab 02/10/24 0504  NA 132*  K 3.6  CL 98  CO2 21*  GLUCOSE 188*  BUN 7  CREATININE 0.77  CALCIUM  9.1  GFRNONAA >60  ANIONGAP 13    Recent Labs  Lab 02/10/24 0504  PROT 7.4  ALBUMIN 3.8  AST 28  ALT 21  ALKPHOS 102  BILITOT 0.4   Lipids No results for input(s): "CHOL", "TRIG", "HDL", "LABVLDL", "LDLCALC", "CHOLHDL" in the last 168 hours.  Hematology Recent Labs  Lab 02/10/24 0504  WBC 26.4*  RBC 4.56  HGB 14.4  HCT 40.3  MCV 88.4  MCH 31.6  MCHC 35.7  RDW 14.3  PLT 393   Thyroid  No results for input(s): "TSH", "FREET4" in the last 168 hours.  BNPNo results for input(s): "BNP", "PROBNP" in the last 168 hours.  DDimer No results for input(s): "DDIMER" in the last 168 hours.   Radiology/Studies:  CT Head Wo Contrast Result Date: 02/10/2024 CLINICAL DATA:  Sudden onset severe headache and dizziness. EXAM: CT HEAD WITHOUT CONTRAST TECHNIQUE: Contiguous axial images were obtained from the base of the skull through the vertex without intravenous contrast. RADIATION DOSE REDUCTION: This exam was performed according to the departmental dose-optimization program which includes automated exposure control, adjustment of the mA and/or kV according to patient size and/or use of iterative reconstruction technique. COMPARISON:  Head CT 07/28/2018 FINDINGS: Brain: No evidence of acute infarction, hemorrhage, hydrocephalus, extra-axial collection or mass lesion/mass effect. Trace mineralization is again noted in the basal ganglia, benign dural calcifications along side of the falx near the vertex.  Vascular: Age advanced calcific plaques both siphons. No hyperdense central vessels. Skull: Negative for fractures or focal lesions. Sinuses/Orbits: No acute findings. Other: None. IMPRESSION: 1. No acute intracranial CT findings or interval changes. 2. Age advanced calcific plaques both siphons. Electronically Signed   By: Denman Fischer M.D.   On: 02/10/2024 07:27   CT Angio Chest/Abd/Pel for Dissection W and/or W/WO Result Date: 02/10/2024 CLINICAL DATA:  Chest pains and dizziness. Acute aortic syndrome suspected. EXAM: CT ANGIOGRAPHY CHEST, ABDOMEN AND PELVIS TECHNIQUE: Non-contrast CT of the chest was initially obtained. Multidetector CT imaging through the chest, abdomen and pelvis was performed using the standard protocol during bolus administration of intravenous contrast. Multiplanar reconstructed images and MIPs were obtained and reviewed to evaluate the vascular anatomy. RADIATION DOSE REDUCTION: This exam was performed according to the departmental dose-optimization program which includes automated exposure control, adjustment of the mA and/or kV according to patient size and/or use of iterative reconstruction technique. CONTRAST:  75mL OMNIPAQUE  IOHEXOL  350 MG/ML SOLN COMPARISON:  Chest CT without contrast 11/12/2015, CTA abdomen and pelvis 11/19/2020. In addition, portable chest from today, PA Lat chest 12/01/2020. FINDINGS: CTA CHEST FINDINGS Cardiovascular: The cardiac size is normal. There is age advanced three-vessel coronary calcification with prior right coronary artery stenting. There is no significant pericardial effusion. Pulmonary arteries and veins are normal in caliber. There is preferential opacification of aorta but there is good opacification of the pulmonary arteries as well, and no embolism at least to the segmental level. There are scattered calcific plaques in aortic arch and great vessels but there is no aneurysm, stenosis or dissection. The aorta is normal in contour and course.  Mediastinum/Nodes: No enlarged mediastinal, hilar, or axillary lymph nodes. Thyroid  gland, trachea, and esophagus demonstrate no significant findings. Lungs/Pleura: There is diffuse bronchial thickening. There is patchy peribronchovascular airspace disease in the posterior basal segment of the right lower lobe consistent with bronchopneumonia. There are mild centrilobular emphysematous changes in both upper lobes. Since 2017 there is interval new subpleural reticulation anteriorly in the right upper middle lobes consistent with breast cancer XRT scarring. There is a 6 mm new subpleural ground-glass nodule in the anterior right upper lobe on 9:47, 4 mm new right upper lobe ground-glass nodule on 9:, a 4 mm subpleural right upper lobe ground-glass nodule on 9:56, and a 5 mm pleural-based ground-glass nodule in right lower lobe on 9:61. There is a chronic 6 mm stable fissural nodule in the left lower lobe on 9:9. Remainder of the lungs are clear. No pleural effusion, thickening or pneumothorax. Musculoskeletal: No regional bone metastasis is seen or acute osseous findings. There are postsurgical changes in the outer right breast consistent  with lumpectomy. Clustered surgical clips right axilla. No mass in the visualized chest wall. Review of the MIP images confirms the above findings. CTA ABDOMEN AND PELVIS FINDINGS VASCULAR Aorta: Again noted is moderate to heavy age advanced fibrofatty and calcific aortic plaque greatest in the infrarenal segment. There is no critical stenosis. No aneurysm or dissection. Celiac: Interval new 50% soft plaque origin stenosis along the lower vessel wall. The remainder is widely patent. SMA: Normal. Renals: Both are single. On the right there is interval new 70% soft plaque focal stenosis 8 mm distal to the vessel ostium, with no other flow-limiting narrowing. On the left, age advanced fibrofatty calcific plaques narrow the proximal 6 mm of the vessel by 60%, but this was seen  previously. The remainder is widely patent. IMA: Patent without evidence of aneurysm, dissection, vasculitis or significant stenosis. Inflow: Patent interval stenting of the left common iliac artery and proximal left external iliac artery. There is stable mild dilatation distal left common iliac and proximal external iliac artery up to 9 mm. There are moderate patchy calcifications in both common iliac and internal iliac arteries. High-grade bilateral internal iliac artery origin stenoses are again noted and moderate irregular stenosis of the remainder of both. The external iliac arteries are widely patent with asymmetric scattered calcific plaque on the right. Veins: No obvious venous abnormality within the limitations of this arterial phase study. Review of the MIP images confirms the above findings. NON-VASCULAR Hepatobiliary: No focal liver abnormality is seen. No gallstones, gallbladder wall thickening, or biliary dilatation. Pancreas: No abnormality. Spleen: No abnormality. Adrenals/Urinary Tract: No adrenal mass. 1.2 cm Bosniak 1 cyst in the posterior right kidney measures 13 Hounsfield units. There are scattered bilateral additional tiny cortical Bosniak 2 cysts which are too small to characterize. No follow-up imaging is recommended. There is no mass enhancement, no obstruction. No bladder thickening. Stomach/Bowel: Moderate thickened folds of the stomach diffusely, thickened folds in duodenal bulb and descending segment. Findings consistent with gastroduodenitis or peptic ulcer disease but this was also seen previously. Rest of the small bowel is unremarkable.  Normal appendix. Moderate fecal stasis ascending and transverse colon. No evidence of colitis or diverticulitis. Lymphatic: No adenopathy. Reproductive: Uterus and bilateral adnexa are unremarkable. Other: Small volume of pelvic cul-de-sac ascites. No other ascites. No free air. Umbilical fat hernia. Musculoskeletal: No acute or significant osseous  findings. Review of the MIP images confirms the above findings. IMPRESSION: 1. No evidence of aortic aneurysm or dissection. 2. Age advanced aortic and coronary artery atherosclerosis. 3. Right lower lobe bronchopneumonia.  Diffuse thickening with COPD. 4. Multiple new ground-glass nodules in the right lung, largest 6 mm. The usual Fleischner criteria do not apply in cancer patients. Three-month follow-up study is recommended as the nodules are too small to biopsy and too small to be evaluated on PET-CT. 5. Interval new 50% soft plaque origin stenosis of the celiac artery. 6. Interval new 70% soft plaque focal stenosis of the proximal right renal artery. 7. Stable 60% stenosis of the proximal left renal artery. 8. Patent interval stenting of the left common iliac artery and proximal left external iliac artery. 9. Stable mild dilatation of the distal left common iliac and proximal external iliac artery up to 9 mm. 10. Stable high-grade bilateral internal iliac artery origin stenoses and moderate irregular stenosis of the remainder of both. 11. Constipation. 12. Stable thickened folds of the stomach and duodenal bulb and descending segment consistent with gastroduodenitis or peptic ulcer disease. 13. Small volume  of pelvic cul-de-sac ascites, nonspecific. 14. Umbilical fat hernia. Aortic Atherosclerosis (ICD10-I70.0) and Emphysema (ICD10-J43.9). Fall Electronically Signed   By: Denman Fischer M.D.   On: 02/10/2024 07:23   DG Chest Port 1 View Result Date: 02/10/2024 CLINICAL DATA:  53 year old female with chest pain. EXAM: PORTABLE CHEST 1 VIEW COMPARISON:  Chest radiographs 12/01/2020 and earlier. FINDINGS: Portable AP view at 0514 hours. Lung volumes and mediastinal contours are stable and within normal limits. Visualized tracheal air column is within normal limits. Allowing for portable technique the lungs are clear. No pneumothorax or pleural effusion. Chronic right axilla and chest wall surgical clips. No acute  osseous abnormality identified. Negative visible bowel gas. IMPRESSION: No acute cardiopulmonary abnormality. Electronically Signed   By: Marlise Simpers M.D.   On: 02/10/2024 05:25     Assessment and Plan:   Chest Pain  Elevated Troponin  Hx of NSTEMI  - s/p RCA PCI 2015 & overlapping DES to previously stented RCA 2018 - 11/2020 Lexiscan : low risk study with normal perfusion and function  - EKG: NSR, HR 68, LVH, T wave more symmetrical and peaked in inferolateral compared to previous - Reported CP started last night while laying in bed after 6 hours, constant for 4 hours until given NTG x2 in ED, stabbing, 5/10, located centrally, less severe than previous MI, no radiation, nonpositonal, nonpleurtic. Associated with N/V, dizziness, and diaphoresis.  - Concerned for ischemic cause with mixed presentation, EKG T wave changes, elevated troponin TN 243 >679, hx of NSTEMI and risk factors. ECHO pending. Troponin pending.  - Based on troponin trend and ECHO, can consider cath.   DOE  - ECHO 10/2020: LVEF 60-65%, G2DD, trival MVR. ECHO pending  - reported SOB with yard work x 1 week. Denies orthopnea, PND, edema or weight changes.  - Appear Euvolemic on exam. BNP pending   HLD, LDL goal < 55 Elevated Lpa, 245 -  per chart review, repatha  d/c due to cost issues and also declined participation in the ocean LP(a) lowering study. Today, reported recently restarted repatha  about 2 weeks ago. - continue Lipitor  80 mg  - continue to follow up with Lipid clinic for repatha . Consider checking FLP 2-3 months given recent resumption of repatha .   HTN  - Home med: Amlodipine  10 mg, Lisinopril  20 mg BID, Spironolactone  50 mg in am and 25 mg in pm - EKG suggestive of LVH. ECHO pending.  - BP currently: 162/94 - Scheduled to receive Lisinopril  20 mg BID and Spiro 12.5 mg. Continue to follow BP after meds received. Can titrate as tolerated.   Elevated Blood Glucose  - Glucose 188 and UA Glucose > 500 - A1c  pending  Pneumonia  - currently on Abx.  - WBC 26, Lactic acid pending - being managed by primary team   Bilateral Renal Artery Stenosis  - CTA: 70% Right renal artery, 60% Left renal artery  - Continue to follow in outpatient   Risk Assessment/Risk Scores:   TIMI Risk Score for Unstable Angina or Non-ST Elevation MI:   The patient's TIMI risk score is 4, which indicates a 20% risk of all cause mortality, new or recurrent myocardial infarction or need for urgent revascularization in the next 14 days.{  For questions or updates, please contact [facility] Please consult [email or web address] for contact info under    Signed, Metta Actis, PA-C  02/10/2024 11:39 AM   History and all data above reviewed.  Patient examined.  I agree with the findings  as above.  The patient has a history of CAD as above.  She lives by herself and walks with a walker because of transverse myelitis.  She has noticed some dyspnea when she is trying to do a little bit in her yard seated but she has not been doing this kind of activity so she is not sure this is necessarily new.  She is not describing PND or orthopnea.  She has not been getting any chest discomfort with this.  However, in the last few days she just has not felt well.  She has had nausea.  She has been a little more dyspneic she thinks.  She is not describing fevers cough or chills.  She is not having the substernal chest discomfort she does not think as severe as she had previously with stenting of the right coronary artery but she is having some discomfort and describes some 7 out of 10 symptoms.  She did get some nitroglycerin  here and she says the discomfort is 3 out of 10.  She is resting comfortably and breathing well.  EKG is not demonstrating acute ST segment changes although she does have an elevated troponin.  She is found to have an elevated white blood cell count. She is found to have focal pneumonia.  Her blood sugar is elevated.  She  does not know her blood pressure at home but she is quite hypertensive here.  She is not sure of her A1c.  She looks like she might have gastroduodenitis.  The patient exam reveals COR: Regular rate and rhythm, no murmurs,  Lungs: Clear to auscultation bilaterally, no edema,  Abd: No rebound, no guarding, no hepatomegaly, Ext 2+ pulses, no edema.  All available labs, radiology testing, previous records reviewed. Agree with documented assessment and plan.  NSTEMI: The patient clearly has had an NSTEMI but no acute ST segment elevation.  I am going to treat her with heparin  and aspirin .  I am going to actually add some nitroglycerin  paste since she is having still some chest discomfort and her blood pressure is not controlled.  She probably will need cardiac catheterization but given the other ongoing issues I would not see an indication to do this acutely unless she has refractory symptoms.  She will need an echocardiogram today.  Hypertension she had to be started on lisinopril  and spironolactone .  I am going to give her a low-dose beta-blocker.   Peripheral vascular disease: As noted on CT.  She is not having any overt symptoms related to this and she will continue with risk reduction.  Pneumonia: Per primary team.  Hypertension: Of the be treated in the context of managing her NSTEMI.  Elevated LP(a): We will see later on whether she is a candidate for clinical trials.  Diabetes mellitus: Per primary team.  Eilleen Grates  12:28 PM  02/10/2024

## 2024-02-10 NOTE — ED Notes (Signed)
 Received pt from [facility]

## 2024-02-11 ENCOUNTER — Encounter (HOSPITAL_COMMUNITY): Payer: Self-pay | Admitting: Cardiology

## 2024-02-11 ENCOUNTER — Encounter (HOSPITAL_COMMUNITY)
Admission: EM | Disposition: A | Discharge status: Home / Self Care | Payer: Self-pay | Source: Home / Self Care | Attending: Internal Medicine

## 2024-02-11 DIAGNOSIS — R0789 Other chest pain: Secondary | ICD-10-CM | POA: Diagnosis not present

## 2024-02-11 DIAGNOSIS — I2510 Atherosclerotic heart disease of native coronary artery without angina pectoris: Secondary | ICD-10-CM

## 2024-02-11 HISTORY — PX: LEFT HEART CATH AND CORONARY ANGIOGRAPHY: CATH118249

## 2024-02-11 LAB — GLUCOSE, CAPILLARY
Glucose-Capillary: 133 mg/dL — ABNORMAL HIGH (ref 70–99)
Glucose-Capillary: 121 mg/dL — ABNORMAL HIGH (ref 70–99)
Glucose-Capillary: 95 mg/dL (ref 70–99)
Glucose-Capillary: 123 mg/dL — ABNORMAL HIGH (ref 70–99)
Glucose-Capillary: 119 mg/dL — ABNORMAL HIGH (ref 70–99)

## 2024-02-11 LAB — COMPREHENSIVE METABOLIC PANEL WITH GFR
ALT: 21 U/L (ref 0–44)
AST: 27 U/L (ref 15–41)
Albumin: 3.8 g/dL (ref 3.5–5.0)
Alkaline Phosphatase: 98 U/L (ref 38–126)
Anion gap: 11 (ref 5–15)
BUN: 7 mg/dL (ref 6–20)
CO2: 21 mmol/L — ABNORMAL LOW (ref 22–32)
Calcium: 9.3 mg/dL (ref 8.9–10.3)
Chloride: 98 mmol/L (ref 98–111)
Creatinine, Ser: 0.79 mg/dL (ref 0.44–1.00)
GFR, Estimated: >60 mL/min (ref >60)
Glucose, Bld: 119 mg/dL — ABNORMAL HIGH (ref 70–99)
Potassium: 3.6 mmol/L (ref 3.5–5.1)
Sodium: 130 mmol/L — ABNORMAL LOW (ref 135–145)
Total Bilirubin: 0.5 mg/dL (ref 0.0–1.2)
Total Protein: 7 g/dL (ref 6.5–8.1)

## 2024-02-11 LAB — CBC
HCT: 38.2 % (ref 36.0–46.0)
Hemoglobin: 13.7 g/dL (ref 12.0–15.0)
MCH: 31.8 pg (ref 26.0–34.0)
MCHC: 35.9 g/dL (ref 30.0–36.0)
MCV: 88.6 fL (ref 80.0–100.0)
Platelets: 399 10*3/uL (ref 150–400)
RBC: 4.31 MIL/uL (ref 3.87–5.11)
RDW: 14.3 % (ref 11.5–15.5)
WBC: 12.3 10*3/uL — ABNORMAL HIGH (ref 4.0–10.5)
nRBC: 0 % (ref 0.0–0.2)

## 2024-02-11 LAB — HEPARIN LEVEL (UNFRACTIONATED)
Heparin Unfractionated: 0.33 [IU]/mL (ref 0.30–0.70)
Heparin Unfractionated: 0.41 [IU]/mL (ref 0.30–0.70)

## 2024-02-11 SURGERY — LEFT HEART CATH AND CORONARY ANGIOGRAPHY
Anesthesia: LOCAL

## 2024-02-11 MED ORDER — SODIUM CHLORIDE 0.9 % WEIGHT BASED INFUSION
1.0000 mL/kg/h | INTRAVENOUS | Status: DC
  Administered 2024-02-11: 1 mL/kg/h via INTRAVENOUS

## 2024-02-11 MED ORDER — IOHEXOL 350 MG/ML SOLN
INTRAVENOUS | Status: DC | PRN
  Administered 2024-02-11: 35 mL

## 2024-02-11 MED ORDER — SODIUM CHLORIDE 0.9 % IV SOLN
INTRAVENOUS | Status: AC | PRN
  Administered 2024-02-11: 10 mL/h via INTRAVENOUS

## 2024-02-11 MED ORDER — ONDANSETRON HCL 4 MG/2ML IJ SOLN
4.0000 mg | Freq: Four times a day (QID) | INTRAMUSCULAR | Status: DC | PRN
Start: 2024-02-11 — End: 2024-02-13

## 2024-02-11 MED ORDER — SODIUM CHLORIDE 0.9% FLUSH
3.0000 mL | Freq: Two times a day (BID) | INTRAVENOUS | Status: DC
  Administered 2024-02-11 – 2024-02-13 (×3): 3 mL via INTRAVENOUS

## 2024-02-11 MED ORDER — LABETALOL HCL 5 MG/ML IV SOLN
INTRAVENOUS | Status: AC
  Filled 2024-02-11: qty 4

## 2024-02-11 MED ORDER — HEPARIN SODIUM (PORCINE) 1000 UNIT/ML IJ SOLN
INTRAMUSCULAR | Status: DC | PRN
  Administered 2024-02-11: 3000 [IU] via INTRAVENOUS

## 2024-02-11 MED ORDER — SODIUM CHLORIDE 0.9 % IV SOLN: 250.0000 mL | INTRAVENOUS | Status: AC | PRN

## 2024-02-11 MED ORDER — SODIUM CHLORIDE 0.9 % WEIGHT BASED INFUSION: 1.0000 mL/kg/h | INTRAVENOUS | Status: DC

## 2024-02-11 MED ORDER — HYDRALAZINE HCL 20 MG/ML IJ SOLN: 5.0000 mg | INTRAMUSCULAR | Status: AC

## 2024-02-11 MED ORDER — ACETAMINOPHEN 325 MG PO TABS
650.0000 mg | ORAL_TABLET | ORAL | Status: DC | PRN
  Administered 2024-02-11 – 2024-02-13 (×5): 650 mg via ORAL
  Filled 2024-02-11 (×5): qty 2

## 2024-02-11 MED ORDER — FENTANYL CITRATE (PF) 100 MCG/2ML IJ SOLN
INTRAMUSCULAR | Status: AC
  Filled 2024-02-11: qty 2

## 2024-02-11 MED ORDER — MIDAZOLAM HCL 2 MG/2ML IJ SOLN
INTRAMUSCULAR | Status: AC
Start: 2024-02-11 — End: ?
  Filled 2024-02-11: qty 2

## 2024-02-11 MED ORDER — SODIUM CHLORIDE 0.9 % WEIGHT BASED INFUSION
3.0000 mL/kg/h | INTRAVENOUS | Status: AC
  Administered 2024-02-11: 3 mL/kg/h via INTRAVENOUS

## 2024-02-11 MED ORDER — ASPIRIN 81 MG PO CHEW: 81.0000 mg | CHEWABLE_TABLET | ORAL | Status: DC

## 2024-02-11 MED ORDER — SODIUM CHLORIDE 0.9% FLUSH: 3.0000 mL | INTRAVENOUS | Status: DC | PRN

## 2024-02-11 MED ORDER — LIDOCAINE HCL (PF) 1 % IJ SOLN
INTRAMUSCULAR | Status: DC | PRN
  Administered 2024-02-11: 2 mL

## 2024-02-11 MED ORDER — VERAPAMIL HCL 2.5 MG/ML IV SOLN
INTRAVENOUS | Status: DC | PRN
  Administered 2024-02-11: 10 mL via INTRA_ARTERIAL

## 2024-02-11 MED ORDER — HEPARIN SODIUM (PORCINE) 1000 UNIT/ML IJ SOLN
INTRAMUSCULAR | Status: AC
  Filled 2024-02-11: qty 10

## 2024-02-11 MED ORDER — VERAPAMIL HCL 2.5 MG/ML IV SOLN
INTRAVENOUS | Status: AC
  Filled 2024-02-11: qty 2

## 2024-02-11 MED ORDER — FENTANYL CITRATE (PF) 100 MCG/2ML IJ SOLN
INTRAMUSCULAR | Status: DC | PRN
  Administered 2024-02-11: 50 ug via INTRAVENOUS

## 2024-02-11 MED ORDER — SODIUM CHLORIDE 0.9 % WEIGHT BASED INFUSION: 3.0000 mL/kg/h | INTRAVENOUS | Status: DC

## 2024-02-11 MED ORDER — SODIUM CHLORIDE 0.9 % IV SOLN
INTRAVENOUS | Status: AC
Start: 2024-02-11 — End: 2024-02-11

## 2024-02-11 MED ORDER — LABETALOL HCL 5 MG/ML IV SOLN: 10.0000 mg | INTRAVENOUS | Status: AC | PRN

## 2024-02-11 MED ORDER — LIDOCAINE HCL (PF) 1 % IJ SOLN
INTRAMUSCULAR | Status: AC
  Filled 2024-02-11: qty 30

## 2024-02-11 MED ORDER — LABETALOL HCL 5 MG/ML IV SOLN
10.0000 mg | Freq: Four times a day (QID) | INTRAVENOUS | Status: DC | PRN
Start: 2024-02-11 — End: 2024-02-11
  Administered 2024-02-11: 10 mg via INTRAVENOUS

## 2024-02-11 MED ORDER — AMLODIPINE BESYLATE 10 MG PO TABS
10.0000 mg | ORAL_TABLET | Freq: Every day | ORAL | Status: DC
Start: 2024-02-11 — End: 2024-02-13
  Administered 2024-02-11 – 2024-02-13 (×3): 10 mg via ORAL
  Filled 2024-02-11 (×3): qty 1

## 2024-02-11 MED ORDER — MIDAZOLAM HCL 2 MG/2ML IJ SOLN
INTRAMUSCULAR | Status: DC | PRN
  Administered 2024-02-11: 1 mg via INTRAVENOUS

## 2024-02-11 MED ORDER — ASPIRIN 81 MG PO CHEW
81.0000 mg | CHEWABLE_TABLET | ORAL | Status: AC
  Administered 2024-02-11: 81 mg via ORAL

## 2024-02-11 MED ORDER — HYDRALAZINE HCL 20 MG/ML IJ SOLN
INTRAMUSCULAR | Status: AC
  Administered 2024-02-11: 5 mg via INTRAVENOUS
  Filled 2024-02-11: qty 1

## 2024-02-11 SURGICAL SUPPLY — 6 items
CATH INFINITI AMBI 5FR TG (CATHETERS) IMPLANT
DEVICE RAD COMP TR BAND LRG (VASCULAR PRODUCTS) IMPLANT
GLIDESHEATH SLEND A-KIT 6F 22G (SHEATH) IMPLANT
GUIDEWIRE INQWIRE 1.5J.035X260 (WIRE) IMPLANT
PACK CARDIAC CATHETERIZATION (CUSTOM PROCEDURE TRAY) ×1 IMPLANT
SET ATX-X65L (MISCELLANEOUS) IMPLANT

## 2024-02-11 NOTE — Progress Notes (Addendum)
 Patient Name: Anna Henson Date of Encounter: 02/11/2024 [facility] Cardiologist: Hazle Lites, MD   Interval Summary  .    She feels good this morning, has Nitropaste so this keeps the chest pain at bay.  No shortness of breath.  Blood pressure been uncontrolled today but started on labetalol  with improvement.  Vital Signs .    Vitals:   02/11/24 0427 02/11/24 0604 02/11/24 0725 02/11/24 0824  BP: (!) 176/93 (!) 219/106 (!) 224/101 (!) 164/84  Pulse: 70 73 70 66  Resp: 16   16  Temp: 97.9 F (36.6 C)   98.2 F (36.8 C)  TempSrc: Oral   Oral  SpO2: 99% 99% 100% 99%  Weight:      Height:        Intake/Output Summary (Last 24 hours) at 02/11/2024 0918 Last data filed at 02/11/2024 0503 Gross per 24 hour  Intake 793.41 ml  Output 1300 ml  Net -506.59 ml      02/10/2024    3:30 PM 02/10/2024    8:10 AM 01/14/2024   12:19 PM  Last 3 Weights  Weight (lbs) 131 lb 13.4 oz 132 lb 7.9 oz 132 lb 8 oz  Weight (kg) 59.8 kg 60.1 kg 60.102 kg      Telemetry/ECG    Sinus rhythm heart rates in the 70s- Personally Reviewed  CV Studies    Echocardiogram 02/11/2024 1. Mild SAM in the setting of moderate symmetrical left ventricular  hypertrophy. Mildly turbulent LVOT flow but no significant gradient. .  Left ventricular ejection fraction, by estimation, is 60 to 65%. The left  ventricle has normal function. The left  ventricle demonstrates regional wall motion abnormalities (see scoring  diagram/findings for description). There is moderate left ventricular  hypertrophy. Left ventricular diastolic parameters are indeterminate.   2. Right ventricular systolic function is normal. The right ventricular  size is normal. Tricuspid regurgitation signal is inadequate for assessing  PA pressure.   3. The mitral valve is abnormal. Mild mitral valve regurgitation. No  evidence of mitral stenosis.   4. The aortic valve is tricuspid. Aortic valve regurgitation is not  visualized. No  aortic stenosis is present.   5. The inferior vena cava is normal in size with greater than 50%  respiratory variability, suggesting right atrial pressure of 3 mmHg.     Physical Exam .   GEN: No acute distress.   Neck: No JVD Cardiac: RRR, no murmurs, rubs, or gallops.  Respiratory: Clear to auscultation bilaterally. GI: Soft, nontender, non-distended  MS: No edema  Patient Profile    Anna Henson is a 53 y.o. female has hx of  CAD (s/p RCA PCI 2015 & overlapping DES to previous placed RCA 2018), HTN, HLD, PAD s/p  peripheral angiography 08/11/2021, breast cancer in remission, transverse myelitis with chronic spine pain since 2017.  Patient admitted for NSTEMI with chest pain also noted to have pneumonia.  Assessment & Plan .     NSTEMI CAD status post PCI to RCA 2015, 2018 - February 2022 normal Lexi She reports acute onset of chest pain associated with vomiting while lying in bed relieved with nitroglycerin . EKG no acute ST changes, prominent T waves. Trops highest at 679 and downtrending now.  Echo with mid anteroseptal hypokinesis.  She is not having any significant chest pain with the Nitropaste and is comfortable.  Will plan for cardiac catheterization today. Currently on IV heparin , aspirin , Lopressor  12.5 mg twice daily, statin, Repatha .  DOE  Echocardiogram with EF 60-65%, mild SAM with moderate LVH, mildly turbulent LVOT with no gradient.  Euvolemic.   Hyperlipidemia Lipid panel 3 months ago shows LDL uncontrolled 220.  LP(a) is 245.4. Continue with atorvastatin  80 mg, recently started Repatha  2 weeks ago.  Assess lipid panel in 6 to 8 weeks.  Hypertension Uncontrolled while here and has not been taking her blood pressure recently.  Systolics as high as 224/101 given IV labetalol  with improvement 160s.  Asking pharmacy to reconcile medications as she is on a very strange regimen with high dose of spironolactone  50 mg in the morning and 25 mg at night (which has been  decreased this admission).  Under titrated on lisinopril .  Will make more adjustments postcardiac catheterization. Currently she is on lisinopril  20 mg twice daily, Lopressor  12.5 mg twice daily, spironolactone  12.5 mg.  Postcardiac catheterization likely will transition to ARB at higher dose, may transition to carvedilol  and bring her back up to 25 mg of spironolactone . Not on amlodipine  10 mg here?  Will continue.  Renal artery stenosis  Angiogram November 2022 showing 40% proximal left renal artery stenosis.  Most recently September 2024 renal artery Doppler showing 60% stenosis in the left renal artery that is unchanged.  This is being managed noninvasively.  PAD  Status post stenting of left common iliac artery and proximal left external iliac 2022.  Pneumonia Per primary team.  Informed Consent   Shared Decision Making/Informed Consent The risks [stroke (1 in 1000), death (1 in 1000), kidney failure [usually temporary] (1 in 500), bleeding (1 in 200), allergic reaction [possibly serious] (1 in 200)], benefits (diagnostic support and management of coronary artery disease) and alternatives of a cardiac catheterization were discussed in detail with Anna Henson and she is willing to proceed.     For questions or updates, please contact [facility] Please consult [email or web address] for contact info under        Signed, Burnetta Cart, PA-C    History and all data above reviewed.  Patient examined.  I agree with the findings as above.  She had chest pain last night and had to have NTG paste reapplied.  The patient exam reveals COR:RRR  ,  Lungs:   ,  Abd: Positive bowel sounds, no rebound no guarding, Ext No edema   .  All available labs, radiology testing, previous records reviewed. Agree with documented assessment and plan.   NSTEMI:  Cath today as above.  HTN:  Titrate meds as above.  We will follow.  See suggestions above.   Royston Cornea Kitt Minardi  11:13 AM  02/11/2024

## 2024-02-11 NOTE — Progress Notes (Signed)
 PHARMACY - ANTICOAGULATION CONSULT NOTE  Pharmacy Consult:  Heparin  Indication: chest pain/ACS  Allergies  Allergen Reactions   Pork-Derived Products Other (See Comments)    Does NOT eat pork    Patient Measurements: Height: 5\' 1"  (154.9 cm) Weight: 59.8 kg (131 lb 13.4 oz) IBW/kg (Calculated) : 47.8 HEPARIN  DW (KG): 59.8 Heparin  dosing weight = 60 kg  Vital Signs: Temp: 98 F (36.7 C) (05/05 1206) Temp Source: Oral (05/05 0824) BP: 163/81 (05/05 1407) Pulse Rate: 0 (05/05 1407)  Labs: Recent Labs    02/10/24 0504 02/10/24 0721 02/10/24 1345 02/10/24 1550 02/10/24 1811 02/11/24 0019 02/11/24 0632  HGB 14.4  --   --   --   --   --  13.7  HCT 40.3  --   --   --   --   --  38.2  PLT 393  --   --   --   --   --  399  HEPARINUNFRC  --   --   --  0.24*  --  0.33 0.41  CREATININE 0.77  --   --   --   --   --  0.79  TROPONINIHS 243*   < > 498* 500* 370*  --   --    < > = values in this interval not displayed.    Estimated Creatinine Clearance: 69.1 mL/min (by C-G formula based on SCr of 0.79 mg/dL).   Medical History: Past Medical History:  Diagnosis Date   Anemia    Anxiety    Bipolar disorder in full remission (HCC)    CAD in native artery cardiologist--  dr hilty   a. 09/2014 Cath/PCI in setting of [place]  - s/p 2.25 x 12 mm Promus Premier DES to mid RCA;  b. 11/2016 Myoview : EF 56%, no ischemia;  c. 12/2016 NSTEMI/PCI: LM nl, LAd 25p, LCX 30p, RCA 100p (2.75x38 Promus Premier DES), mRCA 10 ISR, EF 50-55%.   CKD (chronic kidney disease), stage III (HCC)    Depression    Genetic testing 04/23/2017   Ms. Parodi underwent genetic counseling and testing for hereditary cancer syndromes on 04/05/2017. Her results were negative for mutations in all 46 genes analyzed by [facility]'s 46-gene Common Hereditary Cancers Panel. Genes analyzed include: APC, ATM, AXIN2, BARD1, BMPR1A, BRCA1, BRCA2, BRIP1, CDH1, CDKN2A, CHEK2, CTNNA1, DICER1, EPCAM, GREM1, HOXB13, KIT, MEN1, MLH1, MSH2,  MSH3, MSH6, MUTYH, NBN,   GERD (gastroesophageal reflux disease)    Headache    History of external beam radiation therapy    right breast 07-27-2017  to 09-25-2017   History of non-ST elevation myocardial infarction (NSTEMI)    HOCM (hypertrophic obstructive cardiomyopathy) (HCC) followed by cardiology   a. 12/2016 Echo: EF 65-70%,  mod conc LVH, dynamic obstruction @ rest, peak velocity of 291 cm/sec w/ peak gradient of , no rwma, Gr1 DD, triv TR, PASP .   Hyperlipidemia    Hypertension    Malignant neoplasm of lower-outer quadrant of right breast of female, estrogen receptor positive [facility]) oncologist--- dr Lee Public   dx 06/ 2018--- right breast invasive lobular carcinoma, ductal carcinoma w/ LCIS---- 06-21-2017 s/p right breast lumpecotmy w/ sln disseciton;   completed radiation 09-25-2017;  on tamoxifen    Myocardial infarction [facility])    2017   S/P drug eluting coronary stent placement    09-14-2014---  PCI with DES x1 to midRCA;  12-31-2016  PCI with DES x1 to proxRCA   Transverse myelitis [facility]) neurologist--- dr Godwin Lat   01/ 2017  dx transverse myelitis w/ right side numbness (09-01-2019  currently lower extremity weakness, mucsle spasms, and gait disturbance)   Wears glasses      Assessment: 75 YOF presented with elevated BP, vomiting, diarrhea and dizziness.  Troponin also elevated and Pharmacy consulted to dose IV heparin  for ACS.  No AC PTA.  CBC stable.  Patient agreeable to IV heparin  although refuses pork product.  Heparin  level this morning remains in goal range at 0.41 on 900 units/hr.  Heparin  then turned off for cath lab.  Goal of Therapy:  Heparin  level 0.3-0.7 units/ml Monitor platelets by anticoagulation protocol: Yes   Plan:  S/p cath, no indication to continue IV heparin .  Joanell Mowers, Catheryn Cluck, BCPS, BCCP Clinical Pharmacist  02/11/2024 2:41 PM   [facility] pharmacy phone numbers are listed on [email or web address]

## 2024-02-11 NOTE — Progress Notes (Signed)
 Progress Note   Patient: Anna Henson ZOX:[id number] DOB: 1971/05/10 DOA: 02/10/2024     1 DOS: the patient was seen and examined on 02/11/2024   Brief hospital course: 53 y o woman with PMH of  cervical spinal stenosis, history of transverse myelitis, neuropathy, chronic pain, coronary artery disease with stent in 2018, hypertension, and depression, who presented to the emergency department with chest pain. Patient found to have troponin elevation and started on heparin  gtt and ACS protocol. Also diagnosed with CAP and uncontrolled hypertension.   Assessment and Plan:  CAD/NSTEMI: Patient coming in with chest pain has a high risk history of CAD status post PCI and PAD. Cardiology consulted. Pt found to have elevated TNI and chest pain similar to her previous presentation.  TNI 243/ 679/ 498/500/370.  Patient started on heparin  GTT per ACS protocol. As needed nitroglycerin , as needed morphine . Statin therapy.   TTE revealed EF 60-65 with WMAs Patient is not diabetic.  - Patient scheduled for cardiac cath today.   Uncontrolled HTN SBPs as high as 200s Patient is on amlodipine , lisinopril  , spironolactone  at home.  -Home medications resumed. - As needed hydralazine , labetalol .  CAP: CT findings consistent with RLL bronchopneumonia. - Cont coverage with rocephin  and azithromycin .  -Supplemental oxygen PRN.   Gastroduodenitis/peptic ulcer disease: IV PPI.    PAD: Continue with aspirin  and statin therapy.PT also has BL renal stenosis nonobstructive.  F/u with vascular. Kidney function is wnl.   Stage IIIa CKD: Will continue to monitor and renally dose needed medications.   History of chronic diastolic congestive heart failure: Currently patient is euvolemic no edema no JVD no rales. TTE showed EF 60-65% with WMAs.  - Cardiac cath 5/5.  -No need for diuresis currently.          Subjective: Patient seen prior to cardiac catheterization. She felt unwell earlier when her  blood pressure was very elevated. She denies chest pain.   Physical Exam: Vitals:   02/11/24 1357 02/11/24 1402 02/11/24 1407 02/11/24 1546  BP: (!) 155/84 (!) 163/81 (!) 163/81 (!) 172/82  Pulse: 71 74 (!) 0 62  Resp: 16 13  15   Temp:    97.7 F (36.5 C)  TempSrc:    Oral  SpO2: 97% 97% 96% 100%  Weight:      Height:       Physical Exam   General: Alert, oriented X3  Eyes: Pupils equal, reactive  Oral cavity: moist mucous membranes  Head: Atraumatic, normocephalic  Neck: supple  Chest: clear to auscultation. No crackles, no wheezes  CVS: S1,S2 RRR. No murmurs  Abd: No distention, soft, non-tender. No masses palpable  Extr: No edema   MSK: No joint deformities or swelling  Neurological: Grossly intact.    Data Reviewed:  There are no new results to review at this time.     Latest Ref Rng & Units 02/11/2024    6:32 AM 02/10/2024    5:04 AM 10/29/2023    8:37 AM  BMP  Glucose 70 - 99 mg/dL 811  914  74   BUN 6 - 20 mg/dL 7  7  14    Creatinine 0.44 - 1.00 mg/dL 7.82  9.56  2.13   BUN/Creat Ratio 9 - 23   14   Sodium 135 - 145 mmol/L 130  132  141   Potassium 3.5 - 5.1 mmol/L 3.6  3.6  4.5   Chloride 98 - 111 mmol/L 98  98  104   CO2 22 -  32 mmol/L 21  21  22    Calcium  8.9 - 10.3 mg/dL 9.3  9.1  14.7      Family Communication: n/a  Disposition: Status is: Inpatient Remains inpatient appropriate because: Being treated for NSTEMI  Planned Discharge Destination: Home    Time spent: 35 minutes  Author: MDALA-GAUSI, Tranika Scholler AGATHA, MD 02/11/2024 4:12 PM  For on call review [email or web address].

## 2024-02-11 NOTE — Progress Notes (Signed)
 Called to 6E22 to assess swelling of the right arm following cardiac cath. Right forearm noted to be swollen, pressure held and [place], [place] and Dr. Filiberto Hug called to bedside to evaluate due to hematoma being proximal to stick site with no swelling near TR band.

## 2024-02-11 NOTE — Progress Notes (Addendum)
 Pt c/o sweating and dizziness, BP elevated 219/106. Pt denies Chest pain or pressure Pt had refused Ntg paste d/t headache earlier and was removed at 0430 per pt request. I repalced NNtg paste, gave am dose of Metoprolol  and Lisinopril . CBG 133 and am EKG without acute changes. Provider on call for [facility] notified via secure chat, awaiting further orders  0630 IV Hydralazine  5mg  given per orders.

## 2024-02-11 NOTE — H&P (View-Only) (Signed)
 Patient Name: Anna Henson Date of Encounter: 02/11/2024 [facility] Cardiologist: Hazle Lites, MD   Interval Summary  .    She feels good this morning, has Nitropaste so this keeps the chest pain at bay.  No shortness of breath.  Blood pressure been uncontrolled today but started on labetalol  with improvement.  Vital Signs .    Vitals:   02/11/24 0427 02/11/24 0604 02/11/24 0725 02/11/24 0824  BP: (!) 176/93 (!) 219/106 (!) 224/101 (!) 164/84  Pulse: 70 73 70 66  Resp: 16   16  Temp: 97.9 F (36.6 C)   98.2 F (36.8 C)  TempSrc: Oral   Oral  SpO2: 99% 99% 100% 99%  Weight:      Height:        Intake/Output Summary (Last 24 hours) at 02/11/2024 0918 Last data filed at 02/11/2024 0503 Gross per 24 hour  Intake 793.41 ml  Output 1300 ml  Net -506.59 ml      02/10/2024    3:30 PM 02/10/2024    8:10 AM 01/14/2024   12:19 PM  Last 3 Weights  Weight (lbs) 131 lb 13.4 oz 132 lb 7.9 oz 132 lb 8 oz  Weight (kg) 59.8 kg 60.1 kg 60.102 kg      Telemetry/ECG    Sinus rhythm heart rates in the 70s- Personally Reviewed  CV Studies    Echocardiogram 02/11/2024 1. Mild SAM in the setting of moderate symmetrical left ventricular  hypertrophy. Mildly turbulent LVOT flow but no significant gradient. .  Left ventricular ejection fraction, by estimation, is 60 to 65%. The left  ventricle has normal function. The left  ventricle demonstrates regional wall motion abnormalities (see scoring  diagram/findings for description). There is moderate left ventricular  hypertrophy. Left ventricular diastolic parameters are indeterminate.   2. Right ventricular systolic function is normal. The right ventricular  size is normal. Tricuspid regurgitation signal is inadequate for assessing  PA pressure.   3. The mitral valve is abnormal. Mild mitral valve regurgitation. No  evidence of mitral stenosis.   4. The aortic valve is tricuspid. Aortic valve regurgitation is not  visualized. No  aortic stenosis is present.   5. The inferior vena cava is normal in size with greater than 50%  respiratory variability, suggesting right atrial pressure of 3 mmHg.     Physical Exam .   GEN: No acute distress.   Neck: No JVD Cardiac: RRR, no murmurs, rubs, or gallops.  Respiratory: Clear to auscultation bilaterally. GI: Soft, nontender, non-distended  MS: No edema  Patient Profile    Anna Henson is a 53 y.o. female has hx of  CAD (s/p RCA PCI 2015 & overlapping DES to previous placed RCA 2018), HTN, HLD, PAD s/p  peripheral angiography 08/11/2021, breast cancer in remission, transverse myelitis with chronic spine pain since 2017.  Patient admitted for NSTEMI with chest pain also noted to have pneumonia.  Assessment & Plan .     NSTEMI CAD status post PCI to RCA 2015, 2018 - February 2022 normal Lexi She reports acute onset of chest pain associated with vomiting while lying in bed relieved with nitroglycerin . EKG no acute ST changes, prominent T waves. Trops highest at 679 and downtrending now.  Echo with mid anteroseptal hypokinesis.  She is not having any significant chest pain with the Nitropaste and is comfortable.  Will plan for cardiac catheterization today. Currently on IV heparin , aspirin , Lopressor  12.5 mg twice daily, statin, Repatha .  DOE  Echocardiogram with EF 60-65%, mild SAM with moderate LVH, mildly turbulent LVOT with no gradient.  Euvolemic.   Hyperlipidemia Lipid panel 3 months ago shows LDL uncontrolled 220.  LP(a) is 245.4. Continue with atorvastatin  80 mg, recently started Repatha  2 weeks ago.  Assess lipid panel in 6 to 8 weeks.  Hypertension Uncontrolled while here and has not been taking her blood pressure recently.  Systolics as high as 224/101 given IV labetalol  with improvement 160s.  Asking pharmacy to reconcile medications as she is on a very strange regimen with high dose of spironolactone  50 mg in the morning and 25 mg at night (which has been  decreased this admission).  Under titrated on lisinopril .  Will make more adjustments postcardiac catheterization. Currently she is on lisinopril  20 mg twice daily, Lopressor  12.5 mg twice daily, spironolactone  12.5 mg.  Postcardiac catheterization likely will transition to ARB at higher dose, may transition to carvedilol  and bring her back up to 25 mg of spironolactone . Not on amlodipine  10 mg here?  Will continue.  Renal artery stenosis  Angiogram November 2022 showing 40% proximal left renal artery stenosis.  Most recently September 2024 renal artery Doppler showing 60% stenosis in the left renal artery that is unchanged.  This is being managed noninvasively.  PAD  Status post stenting of left common iliac artery and proximal left external iliac 2022.  Pneumonia Per primary team.  Informed Consent   Shared Decision Making/Informed Consent The risks [stroke (1 in 1000), death (1 in 1000), kidney failure [usually temporary] (1 in 500), bleeding (1 in 200), allergic reaction [possibly serious] (1 in 200)], benefits (diagnostic support and management of coronary artery disease) and alternatives of a cardiac catheterization were discussed in detail with Anna Henson and she is willing to proceed.     For questions or updates, please contact [facility] Please consult [email or web address] for contact info under        Signed, Burnetta Cart, PA-C    History and all data above reviewed.  Patient examined.  I agree with the findings as above.  She had chest pain last night and had to have NTG paste reapplied.  The patient exam reveals COR:RRR  ,  Lungs:   ,  Abd: Positive bowel sounds, no rebound no guarding, Ext No edema   .  All available labs, radiology testing, previous records reviewed. Agree with documented assessment and plan.   NSTEMI:  Cath today as above.  HTN:  Titrate meds as above.  We will follow.  See suggestions above.   Anna Henson  11:13 AM  02/11/2024

## 2024-02-11 NOTE — Progress Notes (Signed)
 PHARMACY - ANTICOAGULATION CONSULT NOTE  Pharmacy Consult:  Heparin  Indication: chest pain/ACS  Allergies  Allergen Reactions   Pork-Derived Products Other (See Comments)    Does NOT eat pork    Patient Measurements: Height: 5\' 1"  (154.9 cm) Weight: 59.8 kg (131 lb 13.4 oz) IBW/kg (Calculated) : 47.8 HEPARIN  DW (KG): 59.8 Heparin  dosing weight = 60 kg  Vital Signs: Temp: 98.6 F (37 C) (05/04 2325) Temp Source: Oral (05/04 2325) BP: 169/83 (05/04 2325) Pulse Rate: 75 (05/04 2325)  Labs: Recent Labs    02/10/24 0504 02/10/24 0721 02/10/24 1345 02/10/24 1550 02/10/24 1811 02/11/24 0019  HGB 14.4  --   --   --   --   --   HCT 40.3  --   --   --   --   --   PLT 393  --   --   --   --   --   HEPARINUNFRC  --   --   --  0.24*  --  0.33  CREATININE 0.77  --   --   --   --   --   TROPONINIHS 243*   < > 498* 500* 370*  --    < > = values in this interval not displayed.    Estimated Creatinine Clearance: 69.1 mL/min (by C-G formula based on SCr of 0.77 mg/dL).   Medical History: Past Medical History:  Diagnosis Date   Anemia    Anxiety    Bipolar disorder in full remission (HCC)    CAD in native artery cardiologist--  dr hilty   a. 09/2014 Cath/PCI in setting of [place]  - s/p 2.25 x 12 mm Promus Premier DES to mid RCA;  b. 11/2016 Myoview : EF 56%, no ischemia;  c. 12/2016 NSTEMI/PCI: LM nl, LAd 25p, LCX 30p, RCA 100p (2.75x38 Promus Premier DES), mRCA 10 ISR, EF 50-55%.   CKD (chronic kidney disease), stage III (HCC)    Depression    Genetic testing 04/23/2017   Ms. Scheid underwent genetic counseling and testing for hereditary cancer syndromes on 04/05/2017. Her results were negative for mutations in all 46 genes analyzed by [facility]'s 46-gene Common Hereditary Cancers Panel. Genes analyzed include: APC, ATM, AXIN2, BARD1, BMPR1A, BRCA1, BRCA2, BRIP1, CDH1, CDKN2A, CHEK2, CTNNA1, DICER1, EPCAM, GREM1, HOXB13, KIT, MEN1, MLH1, MSH2, MSH3, MSH6, MUTYH, NBN,   GERD  (gastroesophageal reflux disease)    Headache    History of external beam radiation therapy    right breast 07-27-2017  to 09-25-2017   History of non-ST elevation myocardial infarction (NSTEMI)    HOCM (hypertrophic obstructive cardiomyopathy) (HCC) followed by cardiology   a. 12/2016 Echo: EF 65-70%,  mod conc LVH, dynamic obstruction @ rest, peak velocity of 291 cm/sec w/ peak gradient of , no rwma, Gr1 DD, triv TR, PASP .   Hyperlipidemia    Hypertension    Malignant neoplasm of lower-outer quadrant of right breast of female, estrogen receptor positive [facility]) oncologist--- dr Lee Public   dx 06/ 2018--- right breast invasive lobular carcinoma, ductal carcinoma w/ LCIS---- 06-21-2017 s/p right breast lumpecotmy w/ sln disseciton;   completed radiation 09-25-2017;  on tamoxifen    Myocardial infarction [facility])    2017   S/P drug eluting coronary stent placement    09-14-2014---  PCI with DES x1 to midRCA;  12-31-2016  PCI with DES x1 to proxRCA   Transverse myelitis [facility]) neurologist--- dr Godwin Lat   01/ 2017  dx transverse myelitis w/ right side numbness (09-01-2019  currently  lower extremity weakness, mucsle spasms, and gait disturbance)   Wears glasses      Assessment: 67 YOF presented with elevated BP, vomiting, diarrhea and dizziness.  Troponin also elevated and Pharmacy consulted to dose IV heparin  for ACS.  No AC PTA.  CBC stable.  Patient agreeable to IV heparin  although refuses pork product.  5/5 AM update:  Heparin  level therapeutic after rate increase  Goal of Therapy:  Heparin  level 0.3-0.7 units/ml Monitor platelets by anticoagulation protocol: Yes   Plan:  Cont heparin  900 units/hr Heparin  level with AM labs  Silvestre Drum, PharmD, BCPS Clinical Pharmacist Phone: [phone]

## 2024-02-11 NOTE — Progress Notes (Signed)
 Postcardiac catheterization patient has developed significant swelling with firmness on her brachial radialis.  She has intact sensation, 5 out of 5 muscle strength, good pulses, good range of motion.  Fortunately vascular ultrasound tech was able to scan and no evidence of hematoma or any other pseudoaneurysm or vascular compromise.    Of note this is on the side of her lymph node removal from previous breast cancer.  For now we will ice it, elevate the arm, marked borders of swelling.  Should she have continued pain or extending swelling may need to consult vascular.  We will closely monitor.

## 2024-02-11 NOTE — Interval H&P Note (Signed)
 History and Physical Interval Note:  02/11/2024 1:48 PM  Anna Henson  has presented today for surgery, with the diagnosis of NSTEMI.  The various methods of treatment have been discussed with the patient and family. After consideration of risks, benefits and other options for treatment, the patient has consented to  Procedure(s): LEFT HEART CATH AND CORONARY ANGIOGRAPHY (N/A) as a surgical intervention.  The patient's history has been reviewed, patient examined, no change in status, stable for surgery.  I have reviewed the patient's chart and labs.  Questions were answered to the patient's satisfaction.     Maretta Overdorf J Shaarav Ripple

## 2024-02-11 NOTE — Plan of Care (Signed)

## 2024-02-12 ENCOUNTER — Other Ambulatory Visit (HOSPITAL_COMMUNITY): Payer: Self-pay

## 2024-02-12 ENCOUNTER — Encounter: Payer: Self-pay | Admitting: Internal Medicine

## 2024-02-12 DIAGNOSIS — I214 Non-ST elevation (NSTEMI) myocardial infarction: Secondary | ICD-10-CM | POA: Diagnosis not present

## 2024-02-12 LAB — CBC
HCT: 38.5 % (ref 36.0–46.0)
Hemoglobin: 13.6 g/dL (ref 12.0–15.0)
MCH: 31.3 pg (ref 26.0–34.0)
MCHC: 35.3 g/dL (ref 30.0–36.0)
MCV: 88.5 fL (ref 80.0–100.0)
Platelets: 379 10*3/uL (ref 150–400)
RBC: 4.35 MIL/uL (ref 3.87–5.11)
RDW: 14.4 % (ref 11.5–15.5)
WBC: 10 10*3/uL (ref 4.0–10.5)
nRBC: 0 % (ref 0.0–0.2)

## 2024-02-12 LAB — BASIC METABOLIC PANEL WITH GFR
Anion gap: 9 (ref 5–15)
BUN: 7 mg/dL (ref 6–20)
CO2: 22 mmol/L (ref 22–32)
Calcium: 9.4 mg/dL (ref 8.9–10.3)
Chloride: 99 mmol/L (ref 98–111)
Creatinine, Ser: 0.97 mg/dL (ref 0.44–1.00)
GFR, Estimated: >60 mL/min (ref >60)
Glucose, Bld: 99 mg/dL (ref 70–99)
Potassium: 4.3 mmol/L (ref 3.5–5.1)
Sodium: 130 mmol/L — ABNORMAL LOW (ref 135–145)

## 2024-02-12 LAB — GLUCOSE, CAPILLARY
Glucose-Capillary: 189 mg/dL — ABNORMAL HIGH (ref 70–99)
Glucose-Capillary: 85 mg/dL (ref 70–99)
Glucose-Capillary: 109 mg/dL — ABNORMAL HIGH (ref 70–99)
Glucose-Capillary: 112 mg/dL — ABNORMAL HIGH (ref 70–99)

## 2024-02-12 MED ORDER — AMOXICILLIN-POT CLAVULANATE 875-125 MG PO TABS
1.0000 | ORAL_TABLET | Freq: Two times a day (BID) | ORAL | Status: DC
  Administered 2024-02-13: 1 via ORAL
  Filled 2024-02-12: qty 1

## 2024-02-12 MED ORDER — CLOPIDOGREL BISULFATE 75 MG PO TABS
75.0000 mg | ORAL_TABLET | Freq: Every day | ORAL | Status: DC
  Administered 2024-02-12 – 2024-02-13 (×2): 75 mg via ORAL
  Filled 2024-02-12 (×2): qty 1

## 2024-02-12 MED ORDER — CARVEDILOL 3.125 MG PO TABS: 3.1250 mg | ORAL_TABLET | Freq: Two times a day (BID) | ORAL | Status: DC

## 2024-02-12 MED ORDER — HYDRALAZINE HCL 20 MG/ML IJ SOLN
10.0000 mg | Freq: Four times a day (QID) | INTRAMUSCULAR | Status: DC | PRN
  Administered 2024-02-12: 10 mg via INTRAVENOUS
  Filled 2024-02-12: qty 1

## 2024-02-12 MED ORDER — ALUM & MAG HYDROXIDE-SIMETH 200-200-20 MG/5ML PO SUSP
30.0000 mL | ORAL | Status: DC | PRN
  Filled 2024-02-12: qty 30

## 2024-02-12 MED ORDER — LISINOPRIL 20 MG PO TABS
40.0000 mg | ORAL_TABLET | Freq: Every day | ORAL | Status: DC
  Administered 2024-02-12 – 2024-02-13 (×2): 40 mg via ORAL
  Filled 2024-02-12 (×2): qty 2

## 2024-02-12 MED ORDER — SPIRONOLACTONE 25 MG PO TABS
25.0000 mg | ORAL_TABLET | Freq: Every day | ORAL | Status: DC
  Administered 2024-02-12 – 2024-02-13 (×2): 25 mg via ORAL
  Filled 2024-02-12 (×2): qty 1

## 2024-02-12 MED ORDER — CARVEDILOL 3.125 MG PO TABS
3.1250 mg | ORAL_TABLET | Freq: Two times a day (BID) | ORAL | 3 refills | Status: DC
  Filled 2024-02-12: qty 60, 30d supply, fill #0

## 2024-02-12 MED ORDER — AMOXICILLIN-POT CLAVULANATE 875-125 MG PO TABS
1.0000 | ORAL_TABLET | Freq: Two times a day (BID) | ORAL | 0 refills | Status: AC
  Filled 2024-02-12: qty 4, 2d supply, fill #0

## 2024-02-12 MED ORDER — CARVEDILOL 3.125 MG PO TABS
3.1250 mg | ORAL_TABLET | Freq: Two times a day (BID) | ORAL | Status: DC
  Administered 2024-02-12: 3.125 mg via ORAL
  Filled 2024-02-12: qty 1

## 2024-02-12 MED ORDER — LABETALOL HCL 5 MG/ML IV SOLN
10.0000 mg | Freq: Once | INTRAVENOUS | Status: AC
  Administered 2024-02-12: 10 mg via INTRAVENOUS
  Filled 2024-02-12: qty 4

## 2024-02-12 MED ORDER — CLOPIDOGREL BISULFATE 75 MG PO TABS
75.0000 mg | ORAL_TABLET | Freq: Every day | ORAL | 3 refills | Status: DC
  Filled 2024-02-12: qty 30, 30d supply, fill #0

## 2024-02-12 MED ORDER — CARVEDILOL 3.125 MG PO TABS
3.1250 mg | ORAL_TABLET | Freq: Once | ORAL | Status: AC
  Administered 2024-02-12: 3.125 mg via ORAL
  Filled 2024-02-12: qty 1

## 2024-02-12 MED ORDER — CARVEDILOL 6.25 MG PO TABS
6.2500 mg | ORAL_TABLET | Freq: Two times a day (BID) | ORAL | Status: DC
  Administered 2024-02-13: 6.25 mg via ORAL
  Filled 2024-02-12: qty 1

## 2024-02-12 NOTE — Progress Notes (Signed)
 pt's BP is up---at 1202 was 190/92 and rechecked 182/95 at 1303---pt states her eyes are a little blurry at this time, little dizzy in bed.  Assisted up to [facility] and got extremely dizzy, back to bed. Pt informed nurse that MD talked about discharging her today.  Dr Melonie Square notified of the above. Response --MD placed orders to give labetalol  IV, coreg  dose now and will wait to see how pt's BP is later today before discharging home.

## 2024-02-12 NOTE — Progress Notes (Signed)
 Progress Note   Patient: Anna Henson HQI:[id number] DOB: 07/13/1971 DOA: 02/10/2024     2 DOS: the patient was seen and examined on 02/12/2024   Brief hospital course: 53 y o woman with PMH of  cervical spinal stenosis, history of transverse myelitis, neuropathy, chronic pain, coronary artery disease with stent in 2018, hypertension, and depression, who presented to the emergency department with chest pain. Patient found to have troponin elevation and started on heparin  gtt and ACS protocol. Also diagnosed with CAP and uncontrolled hypertension.   The patient underwent cardiac catheterization on 02/11/2024 and noted to have "new 90% stenosis in the AV groove which was too small to intervene on.  Otherwise 40 to 50% ISR in RCA.  20% stenosis in LAD."   Medications were adjusted and she was started on DAPT (ASA, Plavix ). Discharge delayed due to uncontrolled hypertension, with the patient complaining of dizziness.   Assessment and Plan:  Uncontrolled HTN SBPs as high as 200s Patient is on amlodipine , lisinopril  , spironolactone  at home.  -Home medications adjusted. - Patient has been started on Coreg . Dose increased to 6.25, but will need to monitor HR response.  - As needed hydralazine .  CAD/NSTEMI Patient coming in with chest pain has a high risk history of CAD status post PCI and PAD. Cardiology consulted. Pt found to have elevated TNI and chest pain similar to her previous presentation.  TNI 243/ 679/ 498/500/370.  Patient started on heparin  GTT per ACS protocol, as needed nitroglycerin , as needed morphine , statin therapy.   TTE revealed EF 60-65 with WMAs S/P LHC on 02/11/24: "New 90% stenosis in the AV groove which was too small to intervene on.  Otherwise 40 to 50% ISR in RCA.  20% stenosis in LAD."  Started on Plavix , aspirin  continued.  - continue above medications.    CAP: CT findings consistent with RLL bronchopneumonia. Treated with rocephin  and azithromycin , now transitioned to  Augmentin.  -Supplemental oxygen PRN.    PAD: Continue with aspirin  and statin therapy.PT also has BL renal stenosis nonobstructive.  - F/u with vascular.    Stage IIIa CKD Kidney function is at baseline.  -Will continue to monitor and renally dose needed medications.  HFpEF Currently patient is euvolemic no edema no JVD no rales. TTE showed EF 60-65% with WMAs.  S/P Cardiac cath 5/5.  -No need for diuresis currently.          Subjective: Patient feeling better this morning. This afternoon, her blood pressure increased to systolic of 190 and patient complained of dizziness. Discharge canceled.   Physical Exam: Vitals:   02/12/24 1342 02/12/24 1412 02/12/24 1442 02/12/24 1635  BP: (!) 162/89 (!) 162/86 (!) 152/76 (!) 178/98  Pulse: 64 63 70 65  Resp:    17  Temp:    98.2 F (36.8 C)  TempSrc:    Oral  SpO2: 100% 100% 100% 100%  Weight:      Height:       Physical Exam   General: Alert, oriented X3  Eyes: Pupils equal, reactive  Oral cavity: moist mucous membranes  Head: Atraumatic, normocephalic  Neck: supple  Chest: clear to auscultation. No crackles, no wheezes  CVS: S1,S2 RRR. No murmurs  Abd: No distention, soft, non-tender. No masses palpable  Extr: No edema   MSK: No joint deformities or swelling  Neurological: Grossly intact.    Data Reviewed:  There are no new results to review at this time.     Latest Ref Rng &  Units 02/12/2024    5:38 AM 02/11/2024    6:32 AM 02/10/2024    5:04 AM  BMP  Glucose 70 - 99 mg/dL 99  811  914   BUN 6 - 20 mg/dL 7  7  7    Creatinine 0.44 - 1.00 mg/dL 7.82  9.56  2.13   Sodium 135 - 145 mmol/L 130  130  132   Potassium 3.5 - 5.1 mmol/L 4.3  3.6  3.6   Chloride 98 - 111 mmol/L 99  98  98   CO2 22 - 32 mmol/L 22  21  21    Calcium  8.9 - 10.3 mg/dL 9.4  9.3  9.1      Family Communication: n/a  Disposition: Status is: Inpatient Remains inpatient appropriate because:Treated for NSTEMI, uncontrolled hypertension   Planned Discharge Destination: Home    Time spent: 35 minutes  Author: MDALA-GAUSI, Orvilla Truett AGATHA, MD 02/12/2024 5:07 PM  For on call review [email or web address].

## 2024-02-12 NOTE — Progress Notes (Addendum)
 Patient Name: Anna Henson Date of Encounter: 02/12/2024 [facility] Cardiologist: Hazle Lites, MD   Interval Summary  .    Post cardiac catheterization had substantial right forearm pain that has since subsided.  Chest pain has improved greatly, reports having a small little pinch right now and a small headache.  Otherwise feels good with no shortness of breath.  Vital Signs .    Vitals:   02/11/24 2338 02/12/24 0201 02/12/24 0505 02/12/24 0742  BP: (!) 172/88 (!) 166/88 (!) 162/90 (!) 169/78  Pulse: 63   63  Resp: 18  17 17   Temp: 97.7 F (36.5 C)  98 F (36.7 C) 98.2 F (36.8 C)  TempSrc: Oral  Oral Oral  SpO2: 99%   100%  Weight:      Height:        Intake/Output Summary (Last 24 hours) at 02/12/2024 0801 Last data filed at 02/12/2024 0739 Gross per 24 hour  Intake 1510.77 ml  Output 1000 ml  Net 510.77 ml      02/10/2024    3:30 PM 02/10/2024    8:10 AM 01/14/2024   12:19 PM  Last 3 Weights  Weight (lbs) 131 lb 13.4 oz 132 lb 7.9 oz 132 lb 8 oz  Weight (kg) 59.8 kg 60.1 kg 60.102 kg      Telemetry/ECG    Sinus rhythm heart rates in the 60-70s- Personally Reviewed  CV Studies    Left heart catheterization 02/11/2024 LM: Normal LAD: Prox 20% disease Lcx: Large OM branches with no significant disease        Small caliber AV grove circumflex with ostial 90% stenosis (new since 2018) RCA: Prox-mid stents with prox 40%, mid 50% ISR          Prox RPDA 40% stenosis   LVEDP 30 mmHg      AV grove circumflex too small to intervene on. Moreover, her presentation likely more consistent with hypertensive urgency than true plaque rupture NSTEMI. She has had at least moderate renal artery stenosis in the past. Consider further evaluation for renal artery stenosis as potential etiology for hypertensive urgency.    Given trop elevation and presence of at least one vessel with severe stenosis, it may be reasonable to treat with DAPT Aspirin  and plavix  for 1  year.    Echocardiogram 02/11/2024 1. Mild SAM in the setting of moderate symmetrical left ventricular  hypertrophy. Mildly turbulent LVOT flow but no significant gradient. .  Left ventricular ejection fraction, by estimation, is 60 to 65%. The left  ventricle has normal function. The left  ventricle demonstrates regional wall motion abnormalities (see scoring  diagram/findings for description). There is moderate left ventricular  hypertrophy. Left ventricular diastolic parameters are indeterminate.   2. Right ventricular systolic function is normal. The right ventricular  size is normal. Tricuspid regurgitation signal is inadequate for assessing  PA pressure.   3. The mitral valve is abnormal. Mild mitral valve regurgitation. No  evidence of mitral stenosis.   4. The aortic valve is tricuspid. Aortic valve regurgitation is not  visualized. No aortic stenosis is present.   5. The inferior vena cava is normal in size with greater than 50%  respiratory variability, suggesting right atrial pressure of 3 mmHg.     Physical Exam .   GEN: No acute distress.   Neck: No JVD Cardiac: RRR, no murmurs, rubs, or gallops.  Respiratory: Clear to auscultation bilaterally. GI: Soft, nontender, non-distended  MS: No edema. R  radial cath site no edema, redness or discharge.   Patient Profile    Anna Henson is a 53 y.o. female has hx of  CAD (s/p RCA PCI 2015 & overlapping DES to previous placed RCA 2018), HTN, HLD, PAD s/p  peripheral angiography 08/11/2021, breast cancer in remission, transverse myelitis with chronic spine pain since 2017.  Patient admitted for NSTEMI with chest pain also noted to have pneumonia.  Assessment & Plan .     NSTEMI CAD status post PCI to RCA 2015, 2018 She reported acute onset of chest pain associated with vomiting while lying in bed relieved with nitroglycerin . EKG no acute ST changes, prominent T waves. Trops highest at 679 and downtrending now.  Echo with mid  anteroseptal hypokinesis.    Underwent cardiac catheterization with new 90% stenosis in the AV groove which was too small to intervene on.  Otherwise 40 to 50% ISR in RCA.  20% stenosis in LAD. Interventionalists advised possible DAPT with aspirin  and Plavix  for 1 year, defer to MD whether to initiate this. Continue with aspirin , transition Lopressor  12.5 mg twice daily to coreg  3.25mg  BID, statin, Repatha .  Could consider adding imdur.   DOE Echocardiogram with EF 60-65%, mild SAM with moderate LVH, mildly turbulent LVOT with no gradient.  Euvolemic.   Hyperlipidemia Lipid panel 3 months ago shows LDL uncontrolled 220.  LP(a) is 245.4. Continue with atorvastatin  80 mg, recently started Repatha  2 weeks ago.  Assess lipid panel in 6 to 8 weeks.  Hypertension Uncontrolled. Systolics as high as 224/101. 160s to 170s today.  PTA meds: Amlodipine  10 mg, Lisinopril  20 mg BID, Spironolactone  50 mg in am and 25 mg in pm  Currently she is on lisinopril  20 mg twice daily, Lopressor  12.5 mg twice daily, spironolactone  12.5 mg, amlodipine  10 mg. Consolidate lisinopril  to 40 mg daily (may need to switch to ARB to titrate further) transition to Coreg  3.25 mg twice daily, increase spironolactone  back to 25 milligrams. May consider adding hydralazine  and referral to HTN clinic.    Renal artery stenosis  Angiogram November 2022 showing 40% proximal left renal artery stenosis.  Most recently September 2024 renal artery Doppler showing 60% stenosis in the left renal artery that is unchanged. Not sure how much this is affecting BP, but would follow up with Dr. Katheryne Pane for further recommendations.   PAD  Status post stenting of left common iliac artery and proximal left external iliac 2022.  Pneumonia Per primary team.  Sees Dr. Maximo Spar 05/08     For questions or updates, please contact [facility] Please consult [email or web address] for contact info under        Signed, Burnetta Cart, PA-C     History and all data above reviewed.  Patient examined.  I agree with the findings as above.   No further chest pain.  Anxious to go home.  The patient exam reveals COR:RRR  ,  Lungs: Clear  ,  Abd: Positive bowel sounds, no rebound no guarding, Ext No edema  .  All available labs, radiology testing, previous records reviewed. Agree with documented assessment and plan. CAD:  Small vessel disease with patent stents.  Given the extent of her symptoms I will suggest DAPT and will order Plavix .  She has no contraindication.  No load needed.  She needs aggressive risk reduction.  Meds for HTN as above but she needs to keep a BP diary and bring that to her next appt.  We will arrange follow up.  Royston Cornea [facility]  10:49 AM  02/12/2024

## 2024-02-12 NOTE — TOC Initial Note (Signed)
 Transition of Care [facility]) - Initial/Assessment Note    Patient Details  Name: Anna Henson MRN: [id number] Date of Birth: 02/14/1971  Transition [facility]) CM/SW Contact:    Cosimo Diones, RN Phone Number: 02/12/2024, 11:32 AM  Clinical Narrative: Patient presented for chest pain. PTA patient was from home alone. Patient states her sister-in-law checks in on her occasionally. Patient has DME wheelchair and rolling walker in the home. Patient is without PCP and is agreeable to have the CMA to schedule an appointment at an office in [place]. No further needs identified at this time. Case Manager will continue to follow for additional transition of care needs as the patient progresses.                    Expected Discharge Plan: Home/Self Care Barriers to Discharge: Continued Medical Work up   Patient Goals and CMS Choice Patient states their goals for this hospitalization and ongoing recovery are:: plan to return home once stable.  Expected Discharge Plan and Services In-house Referral: NA Discharge Planning Services: CM Consult, Follow-up appt scheduled Post Acute Care Choice: NA Living arrangements for the past 2 months: Single Family Home                   DME Agency: NA  Prior Living Arrangements/Services Living arrangements for the past 2 months: Single Family Home Lives with:: Self (sister-in-law checks in on her at times.) Patient language and need for interpreter reviewed:: Yes Do you feel safe going back to the place where you live?: Yes      Need for Family Participation in Patient Care: No (Comment) Care giver support system in place?: No (comment)   Criminal Activity/Legal Involvement Pertinent to Current Situation/Hospitalization: No - Comment as needed  Activities of Daily Living   ADL Screening (condition at time of admission) Independently performs ADLs?: Yes (appropriate for developmental age) Is the patient deaf or have difficulty hearing?:  No Does the patient have difficulty seeing, even when wearing glasses/contacts?: No Does the patient have difficulty concentrating, remembering, or making decisions?: No  Permission Sought/Granted Permission sought to share information with : Family Supports, Case Manager Permission granted to share information with : Yes, Verbal Permission Granted    Emotional Assessment Appearance:: Appears stated age Attitude/Demeanor/Rapport: Engaged Affect (typically observed): Appropriate Orientation: : Oriented to Self, Oriented to Place, Oriented to  Time, Oriented to Situation Alcohol / Substance Use: Not Applicable Psych Involvement: No (comment)  Admission diagnosis:  NSTEMI (non-ST elevated myocardial infarction) (HCC) [I21.4] Chest pain [R07.9] Community acquired pneumonia, unspecified laterality [J18.9] Patient Active Problem List   Diagnosis Date Noted   Renal vascular disease 08/15/2022   Claudication in peripheral vascular disease (HCC) 08/11/2021   Bacterial vaginosis 08/10/2021   Peripheral arterial disease (HCC) 06/17/2021   Cyst of ovary 11/15/2020   History of myocardial infarction 11/15/2020   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   Malignant tumor of breast (HCC) 11/15/2020   Oligomenorrhea 11/15/2020   Uterine leiomyoma 11/15/2020   Hyponatremia 10/14/2020   Chronic diastolic CHF (congestive heart failure) (HCC) 10/08/2020   Neuropathic pain 07/06/2020   Cervical spinal stenosis 06/15/2020   Lhermitte's sign positive 06/15/2020   Labial cyst    Complex cyst of right ovary 12/05/2019   Mood disorder (HCC) 10/13/2019   Hypertensive urgency 07/28/2018   Chronic anemia    MDD (major depressive disorder), recurrent episode, severe (HCC) 08/28/2017   Neck stiffness 08/22/2017   Abnormal finding on MRI  of brain 08/22/2017   HOCM (hypertrophic obstructive cardiomyopathy) (HCC)    Chest pain 05/06/2017   Genetic testing 04/23/2017   Malignant neoplasm of  lower-outer quadrant of right breast of female, estrogen receptor positive (HCC) 03/29/2017   Dysesthesia 03/12/2017   Palpitations 02/23/2017   S/P cardiac cath (12/2016) a. acute total occlusion of the RCA tx w/PCI DES, b. mild nonobs LAD/LCx stenosis, c. 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   Acute-on-chronic kidney injury (HCC) 11/12/2015   Neck pain 11/12/2015   Hypokalemia 11/12/2015   Abnormal MRI, neck 11/12/2015   Numbness 11/12/2015   Stage 3a chronic kidney disease (HCC) 11/12/2015   Hypertensive crisis    Hyperlipidemia    Coronary artery disease involving native coronary artery of native heart without angina pectoris    Facial numbness    Right arm numbness    Throat pain 11/09/2015   Drooling 11/09/2015   CAD (coronary artery disease), native coronary artery 09/15/2014   Headache 09/15/2014   UTI (urinary tract infection): Probable 09/14/2014   Candida infection of genital region 09/14/2014   Unstable angina (HCC) 09/13/2014   HLD (hyperlipidemia) 09/13/2014   Otitis 09/13/2014   ACS (acute coronary syndrome) (HCC) 09/13/2014   Tobacco abuse 09/13/2014   Essential hypertension 09/09/2014   PCP:  Patient, No Pcp Per Pharmacy:   [id number] - [place], [place] - [address] [place] [address] Phone: [phone] Fax: [phone]  [facility] [address] [place] [address] Phone: [phone] Fax: [phone]  Social Drivers of Health (SDOH) Social History: SDOH Screenings   Food Insecurity:  No Food Insecurity (02/10/2024)  Housing: Low Risk  (02/10/2024)  Transportation Needs: No Transportation Needs (02/10/2024)  Utilities: Not At Risk (02/10/2024)  Alcohol Screen: Low Risk  (08/28/2017)  Financial Resource Strain: Medium Risk (08/16/2017)  Physical Activity: Insufficiently Active (08/16/2017)  Social Connections: Unknown (02/20/2022)   Received from [facility], [facility]  Stress: Stress Concern Present (08/16/2017)  Tobacco Use: Medium Risk (02/10/2024)   Readmission Risk Interventions     No data to display

## 2024-02-12 NOTE — Plan of Care (Signed)
   Problem: Activity: Goal: Ability to tolerate increased activity will improve Outcome: Progressing   Problem: Cardiac: Goal: Ability to achieve and maintain adequate cardiovascular perfusion will improve Outcome: Progressing

## 2024-02-13 ENCOUNTER — Telehealth: Payer: Self-pay | Admitting: Cardiology

## 2024-02-13 ENCOUNTER — Other Ambulatory Visit (HOSPITAL_COMMUNITY): Payer: Self-pay

## 2024-02-13 DIAGNOSIS — I200 Unstable angina: Secondary | ICD-10-CM

## 2024-02-13 DIAGNOSIS — R0789 Other chest pain: Secondary | ICD-10-CM | POA: Diagnosis not present

## 2024-02-13 LAB — BASIC METABOLIC PANEL WITH GFR
Anion gap: 10 (ref 5–15)
BUN: 8 mg/dL (ref 6–20)
CO2: 22 mmol/L (ref 22–32)
Calcium: 9.4 mg/dL (ref 8.9–10.3)
Chloride: 96 mmol/L — ABNORMAL LOW (ref 98–111)
Creatinine, Ser: 0.88 mg/dL (ref 0.44–1.00)
GFR, Estimated: >60 mL/min (ref >60)
Glucose, Bld: 114 mg/dL — ABNORMAL HIGH (ref 70–99)
Potassium: 4.1 mmol/L (ref 3.5–5.1)
Sodium: 128 mmol/L — ABNORMAL LOW (ref 135–145)

## 2024-02-13 LAB — GLUCOSE, CAPILLARY
Glucose-Capillary: 147 mg/dL — ABNORMAL HIGH (ref 70–99)
Glucose-Capillary: 130 mg/dL — ABNORMAL HIGH (ref 70–99)

## 2024-02-13 LAB — CBC
HCT: 39.3 % (ref 36.0–46.0)
Hemoglobin: 14.4 g/dL (ref 12.0–15.0)
MCH: 31.9 pg (ref 26.0–34.0)
MCHC: 36.6 g/dL — ABNORMAL HIGH (ref 30.0–36.0)
MCV: 87.1 fL (ref 80.0–100.0)
Platelets: 399 10*3/uL (ref 150–400)
RBC: 4.51 MIL/uL (ref 3.87–5.11)
RDW: 14 % (ref 11.5–15.5)
WBC: 11.6 10*3/uL — ABNORMAL HIGH (ref 4.0–10.5)
nRBC: 0 % (ref 0.0–0.2)

## 2024-02-13 MED ORDER — HYDRALAZINE HCL 50 MG PO TABS
50.0000 mg | ORAL_TABLET | Freq: Three times a day (TID) | ORAL | 3 refills | Status: DC
  Filled 2024-02-13: qty 90, 30d supply, fill #0

## 2024-02-13 MED ORDER — HYDRALAZINE HCL 50 MG PO TABS
50.0000 mg | ORAL_TABLET | Freq: Three times a day (TID) | ORAL | Status: DC
Start: 2024-02-13 — End: 2024-02-13
  Administered 2024-02-13: 50 mg via ORAL
  Filled 2024-02-13: qty 1

## 2024-02-13 MED ORDER — ISOSORBIDE MONONITRATE ER 30 MG PO TB24
30.0000 mg | ORAL_TABLET | Freq: Every day | ORAL | 3 refills | Status: DC
Start: 2024-02-14 — End: 2024-07-24
  Filled 2024-02-13: qty 30, 30d supply, fill #0

## 2024-02-13 MED ORDER — ISOSORBIDE MONONITRATE ER 30 MG PO TB24
30.0000 mg | ORAL_TABLET | Freq: Every day | ORAL | Status: DC
  Administered 2024-02-13: 30 mg via ORAL
  Filled 2024-02-13: qty 1

## 2024-02-13 NOTE — Progress Notes (Addendum)
 Patient Name: Anna Henson Date of Encounter: 02/13/2024 [facility] Cardiologist: Hazle Lites, MD   Interval Summary  .    Somebody spoke to her about her renal artery stenosis and she has become extremely worried about this however we discussed this in great detail and explained management for this.  Had a mild episode of chest pain this morning so Nitropaste was placed and she feels fine now.  Vital Signs .    Vitals:   02/12/24 2012 02/12/24 2350 02/13/24 0045 02/13/24 0440  BP: (!) 175/89 (!) 187/85 (!) 163/86 (!) 169/95  Pulse: 66   89  Resp: 16 15  16   Temp: 98.5 F (36.9 C) 97.9 F (36.6 C)  98.5 F (36.9 C)  TempSrc: Oral Oral  Oral  SpO2: 100%   100%  Weight:      Height:        Intake/Output Summary (Last 24 hours) at 02/13/2024 1610 Last data filed at 02/12/2024 2300 Gross per 24 hour  Intake 460 ml  Output 1800 ml  Net -1340 ml      02/10/2024    3:30 PM 02/10/2024    8:10 AM 01/14/2024   12:19 PM  Last 3 Weights  Weight (lbs) 131 lb 13.4 oz 132 lb 7.9 oz 132 lb 8 oz  Weight (kg) 59.8 kg 60.1 kg 60.102 kg      Telemetry/ECG    Sinus rhythm heart rates in the 80s.  Does have some tachycardic fluctuations low to the 100s- Personally Reviewed  CV Studies    Left heart catheterization 02/11/2024 LM: Normal LAD: Prox 20% disease Lcx: Large OM branches with no significant disease        Small caliber AV grove circumflex with ostial 90% stenosis (new since 2018) RCA: Prox-mid stents with prox 40%, mid 50% ISR          Prox RPDA 40% stenosis   LVEDP 30 mmHg      AV grove circumflex too small to intervene on. Moreover, her presentation likely more consistent with hypertensive urgency than true plaque rupture NSTEMI. She has had at least moderate renal artery stenosis in the past. Consider further evaluation for renal artery stenosis as potential etiology for hypertensive urgency.    Given trop elevation and presence of at least one vessel with  severe stenosis, it may be reasonable to treat with DAPT Aspirin  and plavix  for 1 year.    Echocardiogram 02/11/2024 1. Mild SAM in the setting of moderate symmetrical left ventricular  hypertrophy. Mildly turbulent LVOT flow but no significant gradient. .  Left ventricular ejection fraction, by estimation, is 60 to 65%. The left  ventricle has normal function. The left  ventricle demonstrates regional wall motion abnormalities (see scoring  diagram/findings for description). There is moderate left ventricular  hypertrophy. Left ventricular diastolic parameters are indeterminate.   2. Right ventricular systolic function is normal. The right ventricular  size is normal. Tricuspid regurgitation signal is inadequate for assessing  PA pressure.   3. The mitral valve is abnormal. Mild mitral valve regurgitation. No  evidence of mitral stenosis.   4. The aortic valve is tricuspid. Aortic valve regurgitation is not  visualized. No aortic stenosis is present.   5. The inferior vena cava is normal in size with greater than 50%  respiratory variability, suggesting right atrial pressure of 3 mmHg.     Physical Exam .   GEN: No acute distress.   Neck: No JVD Cardiac: RRR, no  murmurs, rubs, or gallops.  Respiratory: Clear to auscultation bilaterally. GI: Soft, nontender, non-distended  MS: No edema. R radial cath site no edema, redness or discharge.   Patient Profile    Anna Henson is a 53 y.o. female has hx of  CAD (s/p RCA PCI 2015 & overlapping DES to previous placed RCA 2018), HTN, HLD, PAD s/p  peripheral angiography 08/11/2021, breast cancer in remission, transverse myelitis with chronic spine pain since 2017.  Patient admitted for NSTEMI with chest pain also noted to have pneumonia.  Assessment & Plan .     NSTEMI CAD status post PCI to RCA 2015, 2018 She reported acute onset of chest pain associated with vomiting while lying in bed relieved with nitroglycerin . EKG no acute ST  changes, prominent T waves. Trops highest at 679 and downtrending now.  Echo with mid anteroseptal hypokinesis.    Underwent cardiac catheterization with new 90% stenosis in the AV groove which was too small to intervene on.  Otherwise 40 to 50% ISR in RCA.  20% stenosis in LAD. Started on DAPT aspirin  and Plavix  for 1 year.   Continue coreg  6.25mg  BID, statin, Repatha .  Will also add on Imdur 30 mg today  DOE Echocardiogram with EF 60-65%, mild SAM with moderate LVH, mildly turbulent LVOT with no gradient.  Euvolemic.   Hyperlipidemia Lipid panel 3 months ago shows LDL uncontrolled 220.  LP(a) is 245.4. Continue with atorvastatin  80 mg, recently started Repatha  2 weeks ago.  Assess lipid panel in 6 to 8 weeks.  Hypertension Uncontrolled. Systolics as high as 224/101. 160s to 170s today.  PTA meds: Amlodipine  10 mg, Lisinopril  20 mg BID, Spironolactone  50 mg in am and 25 mg in pm  Currently she is on amlodipine  10 mg, lisinopril  40 mg, Coreg  6.25 mg, spironolactone  25 mg.  Start hydralazine  50 mg 3 times daily  May need referral to hypertension clinic, message sent.   Renal artery stenosis  Angiogram November 2022 showing 40% proximal left renal artery stenosis.  Most recently September 2024 renal artery Doppler showing > 60% stenosis in the left renal artery that is unchanged. Not sure how much this is affecting BP, but would follow up with Dr. Katheryne Pane for further recommendations.   PAD  Status post stenting of left common iliac artery and proximal left external iliac 2022.  Pneumonia Per primary team.  Hyponatremia Sodium has downtrended some from 132 on admission to 128 today.  Continue to follow this.  I think it is okay to continue spironolactone  for now.  Follow-up has been arranged.     For questions or updates, please contact [facility] Please consult [email or web address] for contact info under     Signed, Burnetta Cart, PA-C    History and all data above reviewed.   Patient examined.  I agree with the findings as above.  She had some chest pain.   Her blood pressure has been elevated.  NTG paste was added last night.   The patient exam reveals COR:RRR  ,  Lungs: Clear  ,  Abd: Positive bowel sounds, no rebound no guarding, Ext No edema   .  All available labs, radiology testing, previous records reviewed. Agree with documented assessment and plan. Chest pain:  Some of her pain is reproduced with palpation.  Also managing for angina. Imdur added and this can be titrated.  HTN :  Started hydralazine  50 mg tid as above.  She will need to keep a BP diary at home.  Royston Cornea [facility]  10:42 AM  02/13/2024

## 2024-02-13 NOTE — Plan of Care (Signed)

## 2024-02-13 NOTE — Progress Notes (Signed)
 Mobility Specialist Progress Note;   02/13/24 1108  Mobility  Activity Ambulated with assistance in hallway  Level of Assistance Contact guard assist, steadying assist  Assistive Device Front wheel walker  Distance Ambulated (ft) 40 ft  Activity Response Tolerated fair  Mobility Referral Yes  Mobility visit 1 Mobility  Mobility Specialist Start Time (ACUTE ONLY) 1108  Mobility Specialist Stop Time (ACUTE ONLY) 1120  Mobility Specialist Time Calculation (min) (ACUTE ONLY) 12 min    Pre-mobility: BP 115/72 (85) Post-mobility: BP 111/65 (75)  Pt agreeable to mobility. Bps taken before and after mobility. Required MinG assistance during ambulation for safety. C/o feeling weak and fatigued, limiting ambulation distance. Pt returned back to bed with all needs met, call bell in reach.   Anna Henson Mobility Specialist Please contact via SecureChat or [employer] [phone]

## 2024-02-13 NOTE — Discharge Summary (Signed)
 Physician Discharge Summary   Patient: Anna Henson MRN: [id number] DOB: May 14, 1971  Admit date:     02/10/2024  Discharge date: 02/13/24  Discharge Physician: MDALA-GAUSI, Jilda Most   PCP: Patient, No Pcp Per   Recommendations at discharge:   Follow-up in hypertension clinic  Discharge Diagnoses: Principal Problem:   Chest pain  Resolved Problems:   * No resolved hospital problems. [place] [facility] Course: 53 y o woman with PMH of  cervical spinal stenosis, history of transverse myelitis, neuropathy, chronic pain, coronary artery disease with stent in 2018, hypertension, and depression, who presented to the emergency department with chest pain. Patient found to have troponin elevation and started on heparin  gtt and ACS protocol. Also diagnosed with CAP and uncontrolled hypertension.    The patient underwent cardiac catheterization on 02/11/2024 and noted to have "new 90% stenosis in the AV groove which was too small to intervene on.  Otherwise 40 to 50% ISR in RCA.  20% stenosis in LAD."    Medications were adjusted and she was started on DAPT (ASA, Plavix ). Discharge delayed due to uncontrolled hypertension, with the patient complaining of dizziness.  Medications were further adjusted and the patient was discharged home in stable condition.  The rest of the hospital course is in problem-based format below:    Uncontrolled HTN SBPs as high as 200s during the admission. Patient was on amlodipine , lisinopril  , spironolactone  at home.  Medications were adjusted, and she was discharged home on amlodipine , carvedilol , Lasix , hydralazine , lisinopril , spironolactone , Imdur. She is to follow-up in hypertension clinic.   CAD/NSTEMI Patient presented with chest pain and a high risk history of CAD status post PCI and PAD. Cardiology consulted. Pt found to have elevated TNI and chest pain similar to her previous presentation.  TNI 243/ 679/ 498/500/370.  Patient started on heparin  GTT per ACS  protocol, as needed nitroglycerin , as needed morphine , statin therapy.   TTE revealed EF 60-65 with WMAs S/P LHC on 02/11/24: "New 90% stenosis in the AV groove which was too small to intervene on.  Otherwise 40 to 50% ISR in RCA.  20% stenosis in LAD."  Started on Plavix , aspirin  continued.  She is to follow-up as outpatient.   CAP CT findings consistent with RLL bronchopneumonia. Treated with rocephin  and azithromycin , and then transitioned to Augmentin.     PAD Patient was also noted to have right renal artery stenosis, nonobstructive.   Outpatient follow-up with vascular recommended. Continued aspirin , Plavix , atorvastatin .   HFpEF TTE showed EF 60-65% with WMAs.  S/P Cardiac cath 5/5.  Patient remained euvolemic.  Hyponatremia, chronic Likely due to psychoactive medications. Sodium levels remain stable.     Consultants: Cardiology Procedures performed: LHC Disposition: Home Diet recommendation:  Discharge Diet Orders (From admission, onward)     Start     Ordered   02/13/24 0000  Diet - low sodium heart healthy        02/13/24 1316   02/12/24 0000  Diet - low sodium heart healthy        02/12/24 1303           Cardiac diet DISCHARGE MEDICATION: Allergies as of 02/13/2024       Reactions   Pork-derived Products Other (See Comments)   Does NOT eat pork        Medication List     TAKE these medications    amLODipine  10 MG tablet Commonly known as: NORVASC  TAKE 1 TABLET BY MOUTH EVERY DAY   amoxicillin -clavulanate  875-125 MG tablet Commonly known as: AUGMENTIN Take 1 tablet by mouth 2 (two) times daily for 2 days.   anastrozole  1 MG tablet Commonly known as: ARIMIDEX  TAKE 1 TABLET(1 MG) BY MOUTH DAILY What changed: See the new instructions.   aspirin  81 MG chewable tablet Chew 1 tablet (81 mg total) by mouth daily.   atorvastatin  80 MG tablet Commonly known as: LIPITOR  Take 1 tablet (80 mg total) by mouth daily.   carvedilol  3.125 MG  tablet Commonly known as: COREG  Take 1 tablet (3.125 mg total) by mouth 2 (two) times daily with a meal.   clopidogrel  75 MG tablet Commonly known as: PLAVIX  Take 1 tablet (75 mg total) by mouth daily.   fluticasone  50 MCG/ACT nasal spray Commonly known as: FLONASE  Place 1-2 sprays into both nostrils daily as needed for allergies or rhinitis.   furosemide  20 MG tablet Commonly known as: LASIX  TAKE 1 TABLET EVERY DAY FOR FLUID RETENTION   gabapentin  300 MG capsule Commonly known as: NEURONTIN  Take 3 capsules (900 MG) by mouth three times daily   hydrALAZINE  50 MG tablet Commonly known as: APRESOLINE  Take 1 tablet (50 mg total) by mouth 3 (three) times daily.   isosorbide mononitrate 30 MG 24 hr tablet Commonly known as: IMDUR Take 1 tablet (30 mg total) by mouth daily. Start taking on: Feb 14, 2024   lisinopril  20 MG tablet Commonly known as: ZESTRIL  Take 2 tablets (40 mg total) by mouth daily. What changed:  how much to take when to take this   methocarbamol  500 MG tablet Commonly known as: ROBAXIN  TAKE 1 TABLET(500 MG) BY MOUTH THREE TIMES DAILY AS NEEDED FOR MUSCLE SPASMS What changed: See the new instructions.   nitroGLYCERIN  0.4 MG SL tablet Commonly known as: NITROSTAT  PLACE 1 TABLET UNDER THE TONGUE EVERY 5 MINUTES AS NEEDED FOR CHEST PAIN What changed: See the new instructions.   oxcarbazepine  600 MG tablet Commonly known as: TRILEPTAL  Take 1 tablet (600 mg total) by mouth 3 (three) times daily.   Repatha  SureClick 140 MG/ML Soaj Generic drug: Evolocumab  Inject 140 mg into the skin every 14 (fourteen) days.   spironolactone  50 MG tablet Commonly known as: ALDACTONE  Take 0.5 tablets (25 mg total) by mouth daily. What changed:  how much to take additional instructions   traMADol  50 MG tablet Commonly known as: ULTRAM  TAKE 1 TABLET(50 MG) BY MOUTH UP TO THREE TIMES A DAILY AS NEEDED What changed:  how much to take how to take this when to take  this reasons to take this additional instructions   venlafaxine  XR 75 MG 24 hr capsule Commonly known as: EFFEXOR -XR TAKE 1 CAPSULE BY MOUTH DAILY WITH BREAKFAST        Follow-up Information     Everlina Hock, NP Follow up.   Specialties: Family Medicine, Emergency Medicine Why: TIME : 11:00 AM      PLEASE ARRIVE AT 10:30 AM DATE : MAY 29 , 2025  PLEASE BRING ALL MEDICATION,ID and INS CARD Contact information: [address] [place] [address] [phone]                Discharge Exam: Cleavon Curls Weights   02/10/24 0810 02/10/24 1530  Weight: 60.1 kg 59.8 kg   Physical Exam on Day of Discharge   General: Alert, cheerful, oriented X3  Oral cavity: moist mucous membranes  Neck: supple  Chest: clear to auscultation. No crackles, no wheezes  CVS: S1,S2 RRR. No murmurs  Abd: No distention, soft, non-tender. No masses palpable  Extr: No edema    Condition at discharge: stable  The results of significant diagnostics from this hospitalization (including imaging, microbiology, ancillary and laboratory) are listed below for reference.   Imaging Studies: CARDIAC CATHETERIZATION Result Date: 02/11/2024 Images from the original result were not included. Coronary angiography 02/11/2024: LM: Normal LAD: Prox 20% disease Lcx: Large OM branches with no significant disease        Small caliber AV grove circumflex with ostial 90% stenosis (new since 2018) RCA: Prox-mid stents with prox 40%, mid 50% ISR          Prox RPDA 40% stenosis LVEDP 30 mmHg AV grove circumflex too small to intervene on. Moreover, her presentation likely more consistent with hypertensive urgency than true plaque rupture NSTEMI. She has had at least moderate renal artery stenosis in the past. Consider further evaluation for renal artery stenosis as potential etiology for hypertensive urgency. Given trop elevation and presence of at least one vessel with severe stenosis, it may be reasonable to  treat with DAPT Aspirin  and plavix  for 1 year. Cody Das, MD   ECHOCARDIOGRAM COMPLETE Result Date: 02/10/2024    ECHOCARDIOGRAM REPORT   Patient Name:   [facility] Date of Exam: 02/10/2024 Medical Rec #:  [id number]     Height:       61.5 in Accession #:    [id number]    Weight:       132.5 lb Date of Birth:  06-01-1971     BSA:          1.595 m Patient Age:    52 years      BP:           179/74 mmHg Patient Gender: F             HR:           61 bpm. Exam Location:  Inpatient Procedure: 2D Echo (Both Spectral and Color Flow Doppler were utilized during            procedure). Indications:    chest pain  History:        Patient has prior history of Echocardiogram examinations, most                 recent 10/15/2020. Cardiomyopathy and hypertrophic obstructive                 cardiomyopathy, CAD, chronic kidney disease; Risk                 Factors:Dyslipidemia, Hypertension and Former Smoker.  Sonographer:    Dione Franks RDCS Referring Phys: [id number] Metta Actis  Sonographer Comments: Global longitudinal strain was attempted. IMPRESSIONS  1. Mild SAM in the setting of moderate symmetrical left ventricular hypertrophy. Mildly turbulent LVOT flow but no significant gradient. . Left ventricular ejection fraction, by estimation, is 60 to 65%. The left ventricle has normal function. The left ventricle demonstrates regional wall motion abnormalities (see scoring diagram/findings for description). There is moderate left ventricular hypertrophy. Left ventricular diastolic parameters are indeterminate.  2. Right ventricular systolic function is normal. The right ventricular size is normal. Tricuspid regurgitation signal is inadequate for assessing PA pressure.  3. The mitral valve is abnormal. Mild mitral valve regurgitation. No evidence of mitral stenosis.  4. The aortic valve is tricuspid. Aortic valve regurgitation is not visualized. No aortic stenosis is present.  5. The inferior vena cava is normal  in size  with greater than 50% respiratory variability, suggesting right atrial pressure of 3 mmHg. FINDINGS  Left Ventricle: Mild SAM in the setting of moderate symmetrical left ventricular hypertrophy. Mildly turbulent LVOT flow but no significant gradient. Left ventricular ejection fraction, by estimation, is 60 to 65%. The left ventricle has normal function. The left ventricle demonstrates regional wall motion abnormalities. Strain was performed and the global longitudinal strain is indeterminate. The left ventricular internal cavity size was normal in size. There is moderate left ventricular hypertrophy. Left ventricular diastolic parameters are indeterminate.  LV Wall Scoring: The mid anteroseptal segment is hypokinetic. Right Ventricle: The right ventricular size is normal. Right vetricular wall thickness was not well visualized. Right ventricular systolic function is normal. Tricuspid regurgitation signal is inadequate for assessing PA pressure. Left Atrium: Left atrial size was normal in size. Right Atrium: Right atrial size was normal in size. Pericardium: There is no evidence of pericardial effusion. Mitral Valve: The mitral valve is abnormal. Mild mitral valve regurgitation. No evidence of mitral valve stenosis. Tricuspid Valve: The tricuspid valve is normal in structure. Tricuspid valve regurgitation is not demonstrated. No evidence of tricuspid stenosis. Aortic Valve: The aortic valve is tricuspid. Aortic valve regurgitation is not visualized. No aortic stenosis is present. Aortic valve mean gradient measures 5.5 mmHg. Aortic valve peak gradient measures 12.8 mmHg. Aortic valve area, by VTI measures 2.16  cm. Pulmonic Valve: The pulmonic valve was not well visualized. Pulmonic valve regurgitation is mild. No evidence of pulmonic stenosis. Aorta: The aortic root and ascending aorta are structurally normal, with no evidence of dilitation. Venous: The inferior vena cava is normal in size with greater than  50% respiratory variability, suggesting right atrial pressure of 3 mmHg. IAS/Shunts: No atrial level shunt detected by color flow Doppler.  LEFT VENTRICLE PLAX 2D LV PW:         1.30 cm   Diastology LV IVS:        1.30 cm   LV e' medial:    7.62 cm/s LVOT diam:     1.90 cm   LV E/e' medial:  13.6 LV SV:         80        LV e' lateral:   8.05 cm/s LV SV Index:   50        LV E/e' lateral: 12.9 LVOT Area:     2.84 cm  RIGHT VENTRICLE             IVC RV Basal diam:  2.80 cm     IVC diam: 1.60 cm RV S prime:     11.30 cm/s TAPSE (M-mode): 2.1 cm LEFT ATRIUM             Index        RIGHT ATRIUM          Index LA Vol (A2C):   52.0 ml 32.60 ml/m  RA Area:     9.43 cm LA Vol (A4C):   57.2 ml 35.86 ml/m  RA Volume:   19.30 ml 12.10 ml/m LA Biplane Vol: 55.0 ml 34.48 ml/m  AORTIC VALVE AV Area (Vmax):    2.20 cm AV Area (Vmean):   2.42 cm AV Area (VTI):     2.16 cm AV Vmax:           179.17 cm/s AV Vmean:          106.389 cm/s AV VTI:            0.371 m AV  Peak Grad:      12.8 mmHg AV Mean Grad:      5.5 mmHg LVOT Vmax:         139.00 cm/s LVOT Vmean:        90.700 cm/s LVOT VTI:          0.283 m LVOT/AV VTI ratio: 0.76  AORTA Ao Root diam: 2.30 cm Ao Asc diam:  2.50 cm MITRAL VALVE MV Area (PHT): 5.02 cm     SHUNTS MV Decel Time: 151 msec     Systemic VTI:  0.28 m MV E velocity: 104.00 cm/s  Systemic Diam: 1.90 cm MV A velocity: 76.40 cm/s MV E/A ratio:  1.36 Armida Lander MD Electronically signed by Armida Lander MD Signature Date/Time: 02/10/2024/3:02:54 PM    Final    CT Head Wo Contrast Result Date: 02/10/2024 CLINICAL DATA:  Sudden onset severe headache and dizziness. EXAM: CT HEAD WITHOUT CONTRAST TECHNIQUE: Contiguous axial images were obtained from the base of the skull through the vertex without intravenous contrast. RADIATION DOSE REDUCTION: This exam was performed according to the departmental dose-optimization program which includes automated exposure control, adjustment of the mA and/or kV  according to patient size and/or use of iterative reconstruction technique. COMPARISON:  Head CT 07/28/2018 FINDINGS: Brain: No evidence of acute infarction, hemorrhage, hydrocephalus, extra-axial collection or mass lesion/mass effect. Trace mineralization is again noted in the basal ganglia, benign dural calcifications along side of the falx near the vertex. Vascular: Age advanced calcific plaques both siphons. No hyperdense central vessels. Skull: Negative for fractures or focal lesions. Sinuses/Orbits: No acute findings. Other: None. IMPRESSION: 1. No acute intracranial CT findings or interval changes. 2. Age advanced calcific plaques both siphons. Electronically Signed   By: Denman Fischer M.D.   On: 02/10/2024 07:27   CT Angio Chest/Abd/Pel for Dissection W and/or W/WO Result Date: 02/10/2024 CLINICAL DATA:  Chest pains and dizziness. Acute aortic syndrome suspected. EXAM: CT ANGIOGRAPHY CHEST, ABDOMEN AND PELVIS TECHNIQUE: Non-contrast CT of the chest was initially obtained. Multidetector CT imaging through the chest, abdomen and pelvis was performed using the standard protocol during bolus administration of intravenous contrast. Multiplanar reconstructed images and MIPs were obtained and reviewed to evaluate the vascular anatomy. RADIATION DOSE REDUCTION: This exam was performed according to the departmental dose-optimization program which includes automated exposure control, adjustment of the mA and/or kV according to patient size and/or use of iterative reconstruction technique. CONTRAST:  75mL OMNIPAQUE  IOHEXOL  350 MG/ML SOLN COMPARISON:  Chest CT without contrast 11/12/2015, CTA abdomen and pelvis 11/19/2020. In addition, portable chest from today, PA Lat chest 12/01/2020. FINDINGS: CTA CHEST FINDINGS Cardiovascular: The cardiac size is normal. There is age advanced three-vessel coronary calcification with prior right coronary artery stenting. There is no significant pericardial effusion. Pulmonary  arteries and veins are normal in caliber. There is preferential opacification of aorta but there is good opacification of the pulmonary arteries as well, and no embolism at least to the segmental level. There are scattered calcific plaques in aortic arch and great vessels but there is no aneurysm, stenosis or dissection. The aorta is normal in contour and course. Mediastinum/Nodes: No enlarged mediastinal, hilar, or axillary lymph nodes. Thyroid  gland, trachea, and esophagus demonstrate no significant findings. Lungs/Pleura: There is diffuse bronchial thickening. There is patchy peribronchovascular airspace disease in the posterior basal segment of the right lower lobe consistent with bronchopneumonia. There are mild centrilobular emphysematous changes in both upper lobes. Since 2017 there is interval new subpleural reticulation anteriorly in  the right upper middle lobes consistent with breast cancer XRT scarring. There is a 6 mm new subpleural ground-glass nodule in the anterior right upper lobe on 9:47, 4 mm new right upper lobe ground-glass nodule on 9:, a 4 mm subpleural right upper lobe ground-glass nodule on 9:56, and a 5 mm pleural-based ground-glass nodule in right lower lobe on 9:61. There is a chronic 6 mm stable fissural nodule in the left lower lobe on 9:9. Remainder of the lungs are clear. No pleural effusion, thickening or pneumothorax. Musculoskeletal: No regional bone metastasis is seen or acute osseous findings. There are postsurgical changes in the outer right breast consistent with lumpectomy. Clustered surgical clips right axilla. No mass in the visualized chest wall. Review of the MIP images confirms the above findings. CTA ABDOMEN AND PELVIS FINDINGS VASCULAR Aorta: Again noted is moderate to heavy age advanced fibrofatty and calcific aortic plaque greatest in the infrarenal segment. There is no critical stenosis. No aneurysm or dissection. Celiac: Interval new 50% soft plaque origin stenosis  along the lower vessel wall. The remainder is widely patent. SMA: Normal. Renals: Both are single. On the right there is interval new 70% soft plaque focal stenosis 8 mm distal to the vessel ostium, with no other flow-limiting narrowing. On the left, age advanced fibrofatty calcific plaques narrow the proximal 6 mm of the vessel by 60%, but this was seen previously. The remainder is widely patent. IMA: Patent without evidence of aneurysm, dissection, vasculitis or significant stenosis. Inflow: Patent interval stenting of the left common iliac artery and proximal left external iliac artery. There is stable mild dilatation distal left common iliac and proximal external iliac artery up to 9 mm. There are moderate patchy calcifications in both common iliac and internal iliac arteries. High-grade bilateral internal iliac artery origin stenoses are again noted and moderate irregular stenosis of the remainder of both. The external iliac arteries are widely patent with asymmetric scattered calcific plaque on the right. Veins: No obvious venous abnormality within the limitations of this arterial phase study. Review of the MIP images confirms the above findings. NON-VASCULAR Hepatobiliary: No focal liver abnormality is seen. No gallstones, gallbladder wall thickening, or biliary dilatation. Pancreas: No abnormality. Spleen: No abnormality. Adrenals/Urinary Tract: No adrenal mass. 1.2 cm Bosniak 1 cyst in the posterior right kidney measures 13 Hounsfield units. There are scattered bilateral additional tiny cortical Bosniak 2 cysts which are too small to characterize. No follow-up imaging is recommended. There is no mass enhancement, no obstruction. No bladder thickening. Stomach/Bowel: Moderate thickened folds of the stomach diffusely, thickened folds in duodenal bulb and descending segment. Findings consistent with gastroduodenitis or peptic ulcer disease but this was also seen previously. Rest of the small bowel is  unremarkable.  Normal appendix. Moderate fecal stasis ascending and transverse colon. No evidence of colitis or diverticulitis. Lymphatic: No adenopathy. Reproductive: Uterus and bilateral adnexa are unremarkable. Other: Small volume of pelvic cul-de-sac ascites. No other ascites. No free air. Umbilical fat hernia. Musculoskeletal: No acute or significant osseous findings. Review of the MIP images confirms the above findings. IMPRESSION: 1. No evidence of aortic aneurysm or dissection. 2. Age advanced aortic and coronary artery atherosclerosis. 3. Right lower lobe bronchopneumonia.  Diffuse thickening with COPD. 4. Multiple new ground-glass nodules in the right lung, largest 6 mm. The usual Fleischner criteria do not apply in cancer patients. Three-month follow-up study is recommended as the nodules are too small to biopsy and too small to be evaluated on PET-CT. 5. Interval new  50% soft plaque origin stenosis of the celiac artery. 6. Interval new 70% soft plaque focal stenosis of the proximal right renal artery. 7. Stable 60% stenosis of the proximal left renal artery. 8. Patent interval stenting of the left common iliac artery and proximal left external iliac artery. 9. Stable mild dilatation of the distal left common iliac and proximal external iliac artery up to 9 mm. 10. Stable high-grade bilateral internal iliac artery origin stenoses and moderate irregular stenosis of the remainder of both. 11. Constipation. 12. Stable thickened folds of the stomach and duodenal bulb and descending segment consistent with gastroduodenitis or peptic ulcer disease. 13. Small volume of pelvic cul-de-sac ascites, nonspecific. 14. Umbilical fat hernia. Aortic Atherosclerosis (ICD10-I70.0) and Emphysema (ICD10-J43.9). Fall Electronically Signed   By: Denman Fischer M.D.   On: 02/10/2024 07:23   DG Chest Port 1 View Result Date: 02/10/2024 CLINICAL DATA:  53 year old female with chest pain. EXAM: PORTABLE CHEST 1 VIEW COMPARISON:   Chest radiographs 12/01/2020 and earlier. FINDINGS: Portable AP view at 0514 hours. Lung volumes and mediastinal contours are stable and within normal limits. Visualized tracheal air column is within normal limits. Allowing for portable technique the lungs are clear. No pneumothorax or pleural effusion. Chronic right axilla and chest wall surgical clips. No acute osseous abnormality identified. Negative visible bowel gas. IMPRESSION: No acute cardiopulmonary abnormality. Electronically Signed   By: Marlise Simpers M.D.   On: 02/10/2024 05:25    Microbiology: Results for orders placed or performed during the hospital encounter of 01/06/24  Hsv Culture And Typing     Status: None   Collection Time: 01/06/24 11:13 AM   Specimen: Vaginal; Other  Result Value Ref Range Status   HSV Culture/Type Comment  Final    Comment: (NOTE) Negative No Herpes simplex virus isolated. Performed At: [facility] [address] [place], [place] [id number] Pearlean Botts MD NW:[id number]    Source of Sample VAGINAL  Final    Comment: Performed at [facility], [address], [place], [place] [address]    Labs: CBC: Recent Labs  Lab 02/10/24 [phone] 02/11/24 5284 02/12/24 0538 02/13/24 0409  WBC 26.4* 12.3* 10.0 11.6*  NEUTROABS 21.4*  --   --   --   HGB 14.4 13.7 13.6 14.4  HCT 40.3 38.2 38.5 39.3  MCV 88.4 88.6 88.5 87.1  PLT 393 399 379 399   Basic Metabolic Panel: Recent Labs  Lab 02/10/24 0504 02/11/24 0632 02/12/24 0538 02/13/24 0409  NA 132* 130* 130* 128*  K 3.6 3.6 4.3 4.1  CL 98 98 99 96*  CO2 21* 21* 22 22  GLUCOSE 188* 119* 99 114*  BUN 7 7 7 8   CREATININE 0.77 0.79 0.97 0.88  CALCIUM  9.1 9.3 9.4 9.4   Liver Function Tests: Recent Labs  Lab 02/10/24 0504 02/11/24 0632  AST 28 27  ALT 21 21  ALKPHOS 102 98  BILITOT 0.4 0.5  PROT 7.4 7.0  ALBUMIN 3.8 3.8   CBG: Recent Labs  Lab 02/12/24 1200 02/12/24 1634 02/12/24 2125 02/13/24 0805 02/13/24 1201  GLUCAP  85 109* 112* 147* 130*    Discharge time spent: greater than 30 minutes.  Signed: MDALA-GAUSI, Tully Burgo AGATHA, MD [facility] 02/13/2024

## 2024-02-13 NOTE — Telephone Encounter (Signed)
 Can you call patient to get in for HTN clinic please.

## 2024-02-13 NOTE — Progress Notes (Signed)
 Pt c/o chest "discomfort"/soreness. Pt had been refusing ordered NTG paste d/t c/o headache-but agreeable at this time. 1"ntg paste placed per orders and pt also given tylenol  per PRN order to help off set headache. BP at this time 168/88.

## 2024-02-13 NOTE — Progress Notes (Signed)
   02/12/24 2350  Vitals  Temp 97.9 F (36.6 C)  Temp Source Oral  BP (!) 187/85  MAP (mmHg) 113  BP Location Left Arm  ECG Heart Rate 63  Resp 15  Level of Consciousness  Level of Consciousness Alert  MEWS COLOR  MEWS Score Color Green  Oxygen Therapy  O2 Device Room Air  Pain Assessment  Pain Score 0  MEWS Score  MEWS Temp 0  MEWS Systolic 0  MEWS Pulse 0  MEWS RR 0  MEWS LOC 0  MEWS Score 0   IV Hydralazine  given per PRN Orders  0045 BP after IV Hydralazine  163/86

## 2024-02-14 ENCOUNTER — Ambulatory Visit: Admitting: Internal Medicine

## 2024-02-14 ENCOUNTER — Other Ambulatory Visit: Payer: Self-pay | Admitting: Internal Medicine

## 2024-02-14 ENCOUNTER — Encounter (HOSPITAL_BASED_OUTPATIENT_CLINIC_OR_DEPARTMENT_OTHER): Payer: Self-pay | Admitting: Internal Medicine

## 2024-02-14 MED ORDER — FUROSEMIDE 20 MG PO TABS
20.0000 mg | ORAL_TABLET | Freq: Every day | ORAL | 1 refills | Status: DC
Start: 2024-02-14 — End: 2024-07-24

## 2024-02-14 NOTE — Telephone Encounter (Signed)
Routing to correct triage pool

## 2024-03-05 ENCOUNTER — Ambulatory Visit: Attending: Emergency Medicine | Admitting: Emergency Medicine

## 2024-03-05 ENCOUNTER — Encounter: Payer: Self-pay | Admitting: Emergency Medicine

## 2024-03-05 VITALS — BP 110/60 | HR 78 | Ht 62.0 in | Wt 137.0 lb

## 2024-03-05 DIAGNOSIS — I739 Peripheral vascular disease, unspecified: Secondary | ICD-10-CM

## 2024-03-05 DIAGNOSIS — I10 Essential (primary) hypertension: Secondary | ICD-10-CM

## 2024-03-05 DIAGNOSIS — N2889 Other specified disorders of kidney and ureter: Secondary | ICD-10-CM

## 2024-03-05 DIAGNOSIS — E785 Hyperlipidemia, unspecified: Secondary | ICD-10-CM

## 2024-03-05 DIAGNOSIS — I2510 Atherosclerotic heart disease of native coronary artery without angina pectoris: Secondary | ICD-10-CM | POA: Diagnosis not present

## 2024-03-05 DIAGNOSIS — R0600 Dyspnea, unspecified: Secondary | ICD-10-CM | POA: Diagnosis not present

## 2024-03-05 MED ORDER — NITROGLYCERIN 0.4 MG SL SUBL: 0.4000 mg | SUBLINGUAL_TABLET | SUBLINGUAL | 6 refills | Status: AC | PRN

## 2024-03-05 NOTE — Progress Notes (Signed)
 Cardiology Office Note:    Date:  03/05/2024  ID:  Anna Henson, DOB 1971/06/09, MRN [id number] PCP: Patient, No Pcp Per  [facility] Providers Cardiologist:  Hazle Lites, MD       Patient Profile:       Chief Complaint: Hospital follow-up for chest pain hypertension History of Present Illness:  Anna Henson is a 53 y.o. female with visit-pertinent history of history of CAD s/p RCA PCI in 2015 and overlapping DES to previously placed RCA in 2018, hypertension, hyperlipidemia, peripheral arterial disease s/p peripheral angiography on 08/2021, breast cancer in remission, transverse myelitis with chronic spine pain since 2017  She has had a heart attack in the past and has had RCA stenting on 09/14/2014 and again on 12/31/2016.  She also has hypertension for many years and has had several admissions for hypertensive urgency.  She had renal Doppler studies performed on 10/15/2020 that suggested mild to moderate left renal artery stenosis and subsequent CTA performed 11/19/2020 that showed moderate left renal artery stenosis in the 50% range which was confirmatory.  Her CTA showed a focal high-grade distal left common iliac artery stenosis as well.  She underwent peripheral angiography on 08/11/2021 revealing high-grade distal left common iliac artery stenosis which was stented.  She had 90% focal mid left SFA which was not intervened on.  There was no obstructive disease on the right.  She did have 40% proximal left renal artery stenosis.  Her claudication was significantly improved since her procedure.  Patient was seen on 02/10/2024 in the hospital for evaluation of chest pain.  She presented to the ED for evaluation of vomiting, diarrhea, dizziness, and chest pain.  Per EMS patient was diaphoretic and hypertensive in the 200s.  Reported chest pain started while laying in bed after 6 hours, constant for 4 hours until given nitroglycerin  x 2 in the ED.  EKG showed T wave changes.  Elevated  troponins at 243, 679.  She underwent cardiac catheterization revealing new 90% stenosis in the AV groove which was too small to intervene on.  Otherwise 40 to 50% ISR in RCA.  20% stenosis in LAD.  Started on DAPT with aspirin  and Plavix  for 1 year.  She is continued on carvedilol  6.25 mg twice daily, statin, Repatha .  She was also started on Imdur  30 mg daily.  Echocardiogram revealed LVEF 60 to 65%, mild SAM with moderate LVH, mildly turbulent LVOT with no gradient.  Her blood pressures were as high as 224/101.  She was started on hydralazine  50 mg 3 times daily and continued on amlodipine  10 mg, lisinopril  40 mg, carvedilol  6.25 mg twice daily, spironolactone  25 mg.   Discussed the use of AI scribe software for clinical note transcription with the patient, who gave verbal consent to proceed.  History of Present Illness Anna Henson is a 53 year old female with coronary artery disease and hypertension who presents for follow-up after recent hospitalization for chest pain.  Today patient is doing well without acute cardiovascular concerns or complaints.  She notes prior to her recent hospital admission she was not compliant with medication regimen.  However since her discharge home she has been entirely adherent to her medication regimen and eager to improve her health.  Currently, she has no chest pain and her activity level is improving as she builds it up slowly.  Her blood pressure was previously uncontrolled despite being on multiple medications including amlodipine , lisinopril , carvedilol , and spironolactone . Hydralazine  and Imdur  was recently added,  and she is now taking her medications as prescribed, resulting in better blood pressure control with recent readings around 110/60 mmHg.  She has high LDL cholesterol, previously recorded at 220 mg/dL. She was not taking atorvastatin  consistently in the past but is now taking it regularly along with Repatha , which she has been on for six to eight  doses. She is trying to improve her diet by eating more salads.  She denies any chest pains, shortness of breath, orthopnea, PND, leg swelling, syncope, recent injury, dizziness, lightheadedness.  Review of systems:  Please see the history of present illness. All other systems are reviewed and otherwise negative.      Studies Reviewed:        Echocardiogram 02/10/2024 1. Mild SAM in the setting of moderate symmetrical left ventricular  hypertrophy. Mildly turbulent LVOT flow but no significant gradient. .  Left ventricular ejection fraction, by estimation, is 60 to 65%. The left  ventricle has normal function. The left  ventricle demonstrates regional wall motion abnormalities (see scoring  diagram/findings for description). There is moderate left ventricular  hypertrophy. Left ventricular diastolic parameters are indeterminate.   2. Right ventricular systolic function is normal. The right ventricular  size is normal. Tricuspid regurgitation signal is inadequate for assessing  PA pressure.   3. The mitral valve is abnormal. Mild mitral valve regurgitation. No  evidence of mitral stenosis.   4. The aortic valve is tricuspid. Aortic valve regurgitation is not  visualized. No aortic stenosis is present.   5. The inferior vena cava is normal in size with greater than 50%  respiratory variability, suggesting right atrial pressure of 3 mmHg.    Cardiac catheterization 02/11/2024 AV grove circumflex too small to intervene on. Moreover, her presentation likely more consistent with hypertensive urgency than true plaque rupture NSTEMI. She has had at least moderate renal artery stenosis in the past. Consider further evaluation for renal artery stenosis as potential etiology for hypertensive urgency.    Given trop elevation and presence of at least one vessel with severe stenosis, it may be reasonable to treat with DAPT Aspirin  and plavix  for 1 year Diagnostic Dominance: Right  Risk  Assessment/Calculations:              Physical Exam:   VS:  BP 110/60 (BP Location: Left Arm, Patient Position: Sitting, Cuff Size: Normal)   Pulse 78   Ht 5\' 2"  (1.575 m)   Wt 137 lb (62.1 kg)   LMP 03/06/2020   SpO2 96%   BMI 25.06 kg/m    Wt Readings from Last 3 Encounters:  03/05/24 137 lb (62.1 kg)  02/10/24 131 lb 13.4 oz (59.8 kg)  01/14/24 132 lb 8 oz (60.1 kg)    GEN: Well nourished, well developed in no acute distress NECK: No JVD; No carotid bruits CARDIAC: RRR, no murmurs, rubs, gallops RESPIRATORY:  Clear to auscultation without rales, wheezing or rhonchi  ABDOMEN: Soft, non-tender, non-distended EXTREMITIES:  No edema; No acute deformity      Assessment and Plan:  Coronary artery disease / NSTEMI S/p PCI to RCA in 2015 and overlapping DES to RCA in 2018 S/p NSTEMI 02/2024 showing new 90% stenosis in the AV groove which was too small to intervene, otherwise 40 to 50% in-stent restenosis in RCA, 20% stenosis in LAD Given trop elevation and presence of at least one vessel with severe stenosis, patient was treated with DAPT Aspirin  and Plavix  for 1 year - Today patient is doing well without anginal  symptoms.  She reports chest pains have entirely resolved.  There is no indication for further ischemic evaluation at this time - Continue DAPT with aspirin  81 mg daily and clopidogrel  75 mg daily for 1 year - Continue carvedilol  6.25 mg twice daily, isosorbide  30 mg daily, atorvastatin  80 mg daily  Hyperlipidemia LDL 220 on 10/2023 Lipoprotein (A) 245 on 05/2022 Given her history her LDL goal should be less than 55 Patient noted recently started Repatha  and in the past has been inconsistently taking her statin lowering therapy.  However notes adherence since admission - Repeat lipid panel x1 month  - Continue atorvastatin  80 mg daily and Repatha   Hypertension BP as high as 224/101 during admission and 160s to 170s at discharge - Blood pressure today is 110/60 and under  excellent control.  Patient notes similar blood pressure readings at home BP monitoring - Patient notes prior to her admission she had been inconsistently taking her BP meds.  Notes adherence to prescribed regimen since discharge - Continue amlodipine  10 mg daily, carvedilol  3.125 mg twice daily, hydralazine  50 mg 3 times daily, Imdur  30 mg daily, lisinopril  40 mg daily, spironolactone  25 mg daily  Peripheral arterial disease Status post stenting of left common iliac artery and proximal left external iliac 2022.  - Her most recent Doppler studies performed 02/07/2022 revealed her left iliac stent to be widely patent with a left ABI of 1.06  - Denies any claudication today  Renal artery stenosis  Angiogram November 2022 showing 40% proximal left renal artery stenosis.  Most recently September 2024 renal artery doppler showing > 60% stenosis in the left renal artery that is unchanged. - Today her blood pressure is much improved now with full adherence to prescribed medication regimen - Consider repeating renal artery duplex at follow-up visit - Managed by Dr. Katheryne Pane  Dyspnea Echocardiogram with EF 60-65%, mild SAM with moderate LVH, mildly turbulent LVOT with no gradient DOE remains stable.  No recent exacerbations - Today patient is euvolemic and well compensated on exam.  - No SOB at rest, orthopnea, PND.  Weight has been stable - Will continue to clinically monitor     Dispo:  Return in about 3 months (around 06/05/2024).  Signed, Ava Boatman, NP

## 2024-03-05 NOTE — Patient Instructions (Signed)
 Medication Instructions:  NO CHANGES   Lab Work: NONE  Testing/Procedures: NONE  Follow-Up: At [facility], you and your health needs are our priority.  As part of our continuing mission to provide you with exceptional heart care, our providers are all part of one team.  This team includes your primary Cardiologist (physician) and Advanced Practice Providers or APPs (Physician Assistants and Nurse Practitioners) who all work together to provide you with the care you need, when you need it.  Your next appointment:   3 month(s)  Provider:   MADISON FOUNTAIN, DNP

## 2024-03-06 ENCOUNTER — Ambulatory Visit: Admitting: Family Medicine

## 2024-03-06 DIAGNOSIS — Z0000 Encounter for general adult medical examination without abnormal findings: Secondary | ICD-10-CM

## 2024-03-10 ENCOUNTER — Other Ambulatory Visit: Payer: Self-pay | Admitting: Neurology

## 2024-03-10 ENCOUNTER — Encounter: Payer: Self-pay | Admitting: Neurology

## 2024-03-10 DIAGNOSIS — Z0289 Encounter for other administrative examinations: Secondary | ICD-10-CM

## 2024-03-11 NOTE — Telephone Encounter (Signed)
 Spoke w/ Dr. Godwin Lat. He approved refilling. I e-scribed to pharmacy.

## 2024-03-11 NOTE — Telephone Encounter (Signed)
 Gave completed/signed form back to medical records to process for pt.

## 2024-03-12 NOTE — Telephone Encounter (Signed)
Can you help patient with this? Thanks!  

## 2024-03-24 ENCOUNTER — Other Ambulatory Visit (HOSPITAL_COMMUNITY): Payer: Self-pay

## 2024-04-03 ENCOUNTER — Other Ambulatory Visit (HOSPITAL_COMMUNITY): Payer: Self-pay

## 2024-04-09 ENCOUNTER — Institutional Professional Consult (permissible substitution) (HOSPITAL_BASED_OUTPATIENT_CLINIC_OR_DEPARTMENT_OTHER): Admitting: Family

## 2024-05-02 ENCOUNTER — Encounter (HOSPITAL_COMMUNITY): Payer: Self-pay | Admitting: Emergency Medicine

## 2024-05-02 ENCOUNTER — Emergency Department (HOSPITAL_COMMUNITY)

## 2024-05-02 ENCOUNTER — Other Ambulatory Visit: Payer: Self-pay

## 2024-05-02 ENCOUNTER — Observation Stay (HOSPITAL_COMMUNITY)
Admission: EM | Admit: 2024-05-02 | Discharge: 2024-05-03 | Disposition: A | Discharge status: Home / Self Care | Attending: Internal Medicine | Admitting: Internal Medicine

## 2024-05-02 DIAGNOSIS — E782 Mixed hyperlipidemia: Secondary | ICD-10-CM

## 2024-05-02 DIAGNOSIS — Z8659 Personal history of other mental and behavioral disorders: Secondary | ICD-10-CM | POA: Insufficient documentation

## 2024-05-02 DIAGNOSIS — I169 Hypertensive crisis, unspecified: Secondary | ICD-10-CM | POA: Diagnosis present

## 2024-05-02 DIAGNOSIS — R079 Chest pain, unspecified: Principal | ICD-10-CM | POA: Diagnosis present

## 2024-05-02 DIAGNOSIS — I4581 Long QT syndrome: Secondary | ICD-10-CM | POA: Diagnosis not present

## 2024-05-02 DIAGNOSIS — I160 Hypertensive urgency: Secondary | ICD-10-CM | POA: Insufficient documentation

## 2024-05-02 DIAGNOSIS — Z87891 Personal history of nicotine dependence: Secondary | ICD-10-CM | POA: Insufficient documentation

## 2024-05-02 DIAGNOSIS — R9431 Abnormal electrocardiogram [ECG] [EKG]: Secondary | ICD-10-CM | POA: Diagnosis present

## 2024-05-02 DIAGNOSIS — R0789 Other chest pain: Secondary | ICD-10-CM | POA: Diagnosis not present

## 2024-05-02 DIAGNOSIS — I2510 Atherosclerotic heart disease of native coronary artery without angina pectoris: Secondary | ICD-10-CM | POA: Diagnosis not present

## 2024-05-02 DIAGNOSIS — E876 Hypokalemia: Secondary | ICD-10-CM | POA: Diagnosis present

## 2024-05-02 DIAGNOSIS — G629 Polyneuropathy, unspecified: Secondary | ICD-10-CM | POA: Insufficient documentation

## 2024-05-02 DIAGNOSIS — E785 Hyperlipidemia, unspecified: Secondary | ICD-10-CM | POA: Diagnosis not present

## 2024-05-02 DIAGNOSIS — Z7901 Long term (current) use of anticoagulants: Secondary | ICD-10-CM | POA: Insufficient documentation

## 2024-05-02 DIAGNOSIS — K219 Gastro-esophageal reflux disease without esophagitis: Secondary | ICD-10-CM | POA: Diagnosis present

## 2024-05-02 DIAGNOSIS — Z8679 Personal history of other diseases of the circulatory system: Secondary | ICD-10-CM

## 2024-05-02 DIAGNOSIS — D72829 Elevated white blood cell count, unspecified: Secondary | ICD-10-CM | POA: Diagnosis not present

## 2024-05-02 DIAGNOSIS — Z853 Personal history of malignant neoplasm of breast: Secondary | ICD-10-CM | POA: Diagnosis not present

## 2024-05-02 DIAGNOSIS — F32A Depression, unspecified: Secondary | ICD-10-CM | POA: Diagnosis not present

## 2024-05-02 LAB — I-STAT CHEM 8, ED
BUN: 10 mg/dL (ref 6–20)
Calcium, Ion: 1.11 mmol/L — ABNORMAL LOW (ref 1.15–1.40)
Chloride: 100 mmol/L (ref 98–111)
Creatinine, Ser: 0.8 mg/dL (ref 0.44–1.00)
Glucose, Bld: 196 mg/dL — ABNORMAL HIGH (ref 70–99)
HCT: 45 % (ref 36.0–46.0)
Hemoglobin: 15.3 g/dL — ABNORMAL HIGH (ref 12.0–15.0)
Potassium: 2.9 mmol/L — ABNORMAL LOW (ref 3.5–5.1)
Sodium: 135 mmol/L (ref 135–145)
TCO2: 21 mmol/L — ABNORMAL LOW (ref 22–32)

## 2024-05-02 LAB — CBC WITH DIFFERENTIAL/PLATELET
Abs Immature Granulocytes: 0.16 K/uL — ABNORMAL HIGH (ref 0.00–0.07)
Basophils Absolute: 0.1 K/uL (ref 0.0–0.1)
Basophils Relative: 0 %
Eosinophils Absolute: 0.4 K/uL (ref 0.0–0.5)
Eosinophils Relative: 2 %
HCT: 41.5 % (ref 36.0–46.0)
Hemoglobin: 14.4 g/dL (ref 12.0–15.0)
Immature Granulocytes: 1 %
Lymphocytes Relative: 25 %
Lymphs Abs: 5.7 K/uL — ABNORMAL HIGH (ref 0.7–4.0)
MCH: 31.2 pg (ref 26.0–34.0)
MCHC: 34.7 g/dL (ref 30.0–36.0)
MCV: 90 fL (ref 80.0–100.0)
Monocytes Absolute: 1.2 K/uL — ABNORMAL HIGH (ref 0.1–1.0)
Monocytes Relative: 5 %
Neutro Abs: 15.2 K/uL — ABNORMAL HIGH (ref 1.7–7.7)
Neutrophils Relative %: 67 %
Platelets: 431 K/uL — ABNORMAL HIGH (ref 150–400)
RBC: 4.61 MIL/uL (ref 3.87–5.11)
RDW: 13.9 % (ref 11.5–15.5)
Smear Review: NORMAL
WBC: 22.7 K/uL — ABNORMAL HIGH (ref 4.0–10.5)
nRBC: 0 % (ref 0.0–0.2)

## 2024-05-02 LAB — TROPONIN I (HIGH SENSITIVITY): Troponin I (High Sensitivity): 12 ng/L (ref <18)

## 2024-05-02 LAB — BASIC METABOLIC PANEL WITH GFR
Anion gap: 15 (ref 5–15)
BUN: 10 mg/dL (ref 6–20)
CO2: 19 mmol/L — ABNORMAL LOW (ref 22–32)
Calcium: 9.6 mg/dL (ref 8.9–10.3)
Chloride: 100 mmol/L (ref 98–111)
Creatinine, Ser: 0.93 mg/dL (ref 0.44–1.00)
GFR, Estimated: >60 mL/min (ref >60)
Glucose, Bld: 191 mg/dL — ABNORMAL HIGH (ref 70–99)
Potassium: 3.1 mmol/L — ABNORMAL LOW (ref 3.5–5.1)
Sodium: 134 mmol/L — ABNORMAL LOW (ref 135–145)

## 2024-05-02 MED ORDER — HYDRALAZINE HCL 20 MG/ML IJ SOLN
10.0000 mg | Freq: Once | INTRAMUSCULAR | Status: AC
  Administered 2024-05-02: 10 mg via INTRAVENOUS
  Filled 2024-05-02: qty 1

## 2024-05-02 MED ORDER — MORPHINE SULFATE (PF) 4 MG/ML IV SOLN
4.0000 mg | Freq: Once | INTRAVENOUS | Status: AC
  Administered 2024-05-02: 4 mg via INTRAVENOUS
  Filled 2024-05-02: qty 1

## 2024-05-02 MED ORDER — ONDANSETRON HCL 4 MG/2ML IJ SOLN
4.0000 mg | Freq: Four times a day (QID) | INTRAMUSCULAR | Status: DC | PRN
  Administered 2024-05-03 (×2): 4 mg via INTRAVENOUS
  Filled 2024-05-02 (×2): qty 2

## 2024-05-02 MED ORDER — ACETAMINOPHEN 325 MG PO TABS: 650.0000 mg | ORAL_TABLET | Freq: Four times a day (QID) | ORAL | Status: DC | PRN

## 2024-05-02 MED ORDER — POTASSIUM CHLORIDE CRYS ER 20 MEQ PO TBCR
40.0000 meq | EXTENDED_RELEASE_TABLET | Freq: Once | ORAL | Status: AC
  Administered 2024-05-02: 40 meq via ORAL
  Filled 2024-05-02: qty 2

## 2024-05-02 MED ORDER — HYDRALAZINE HCL 20 MG/ML IJ SOLN
10.0000 mg | INTRAMUSCULAR | Status: DC | PRN
  Administered 2024-05-03: 10 mg via INTRAVENOUS
  Filled 2024-05-02: qty 1

## 2024-05-02 MED ORDER — CARVEDILOL 3.125 MG PO TABS
3.1250 mg | ORAL_TABLET | Freq: Two times a day (BID) | ORAL | Status: DC
  Administered 2024-05-02: 3.125 mg via ORAL
  Filled 2024-05-02: qty 1

## 2024-05-02 MED ORDER — MELATONIN 3 MG PO TABS: 3.0000 mg | ORAL_TABLET | Freq: Every evening | ORAL | Status: DC | PRN

## 2024-05-02 MED ORDER — NALOXONE HCL 0.4 MG/ML IJ SOLN: 0.4000 mg | INTRAMUSCULAR | Status: DC | PRN

## 2024-05-02 MED ORDER — ACETAMINOPHEN 650 MG RE SUPP
650.0000 mg | Freq: Four times a day (QID) | RECTAL | Status: DC | PRN
Start: 2024-05-02 — End: 2024-05-03

## 2024-05-02 MED ORDER — MORPHINE SULFATE (PF) 4 MG/ML IV SOLN
4.0000 mg | INTRAVENOUS | Status: DC | PRN
  Administered 2024-05-03: 4 mg via INTRAVENOUS
  Filled 2024-05-02: qty 1

## 2024-05-02 NOTE — H&P (Signed)
 History and Physical      Anna Henson FMW:[id number] DOB: 1970-11-24 DOA: 05/02/2024; DOS: 05/02/2024  PCP: Patient, No Pcp Per (will further assess) Patient coming from: home   I have personally briefly reviewed patient's old medical records in [facility]  Chief Complaint: Chest pain  HPI: Anna Henson is a 53 y.o. female with medical history significant for essential hypertension, coronary artery disease, GERD, hyperlipidemia, right-sided breast cancer, who is admitted to [facility] on 05/02/2024 with chest pain after presenting from home to [facility] ED complaining of chest pain.   PE patient presents with a episode of substernal, nonradiating chest pain that started earlier today, while at rest, and was associated with some diaphoresis as well as nausea, in the absence of reported associated shortness of breath, palpitations, or syncope.  The episode of chest pain was nonexertional, did not improve with sublingual nitroglycerin  is administered via EMS and route to [facility] emergency department this evening.  However, the patient's chest pain has improved with a dose of IV morphine  as well as improving initial hypertensive blood pressures.  Denies any recent hemoptysis, cough.  No recent subjective fever, chills, rigors, or generalized myalgias.  Denies any acute focal weakness or any acute focal numbness/paresthesias.  She was hospitalized at [facility] from 02/10/2024 to 02/13/2024 with NSTEMI that was felt to be type II in nature in the setting of hypertensive crisis after presenting with similar episode of chest pain.  High sensitive troponin I peaked during that previous hospitalization at 679, with most recent prior high-sensitivity troponin I noted to be 370.  During this most recent prior hospitalization, she underwent left-sided coronary angiography, which showed evidence of an obstructive lesion in the left circumflex that was felt to be not amenable to revascularization.   Rather, medical management is being pursued, the patient noted to be on dual antiplatelet therapy as well as lisinopril , Coreg , Imdur , as well as high intensity atorvastatin .  During his previous hospitalization, she also underwent CTA dissection study which showed no evidence of aortic aneurysm, dissection, or rupture.   In addition to aforementioned lisinopril , Coreg , Imdur , her outpatient antihypertensive regimen also includes Norvasc  as well as hydralazine .    ED Course:  Vital signs in the ED were notable for the following: Afebrile; heart rates in the 60s to 80s; initial blood pressure noted to be 231/89, which is decreased into the systolic below 180s after single dose of IV hydralazine  as well as a dose of IV morphine ; respiratory rate 14-18, oxygen saturation 100% on room air.  Labs were notable for the following: BMP notable for the following: Potassium 3.1, creatinine 0.93.  High-sensitivity troponin I was noted to be 12, with repeat value pending, and relative to most recent prior high sensitive troponin I value of 370 on 02/10/2024.  CBC notable for white blood cell count 22,700 compared to 11,600 on 02/13/2024, with tonight's blood cell count associate with 67% neutrophils, hemoglobin 14.4.  Per my interpretation, EKG in ED demonstrated the following: In comparison to most recent prior EKG performed on 02/11/2024, the CBC EKG shows sinus rhythm with heart rate 77, prolonged QTc of 501, nonspecific T wave inversion in aVL, which appears unchanged from most recent prior EKG, will demonstrating no evidence of ST changes, including no evidence of ST elevation.  Imaging in the ED, per corresponding formal radiology read, was notable for the following: 1 view chest x-ray showed no evidence of acute cardiopulmonary process, including no evidence of infiltrate edema,  effusion, or pneumothorax.  While in the ED, the following were administered: Hydralazine  10 mg IV x 1 dose, morphine  4 mg IV x 1  dose.  Subsequently, the patient was admitted for further evaluation management of presenting atypical chest pain in the setting of hypertensive crisis, with presenting labs notable for hypokalemia, leukocytosis, and with presenting EKG notable for QTc prolongation.    Review of Systems: As per HPI otherwise 10 point review of systems negative.   Past Medical History:  Diagnosis Date   Anemia    Anxiety    Bipolar disorder in full remission (HCC)    CAD in native artery cardiologist--  dr hilty   a. 09/2014 Cath/PCI in setting of [place]  - s/p 2.25 x 12 mm Promus Premier DES to mid RCA;  b. 11/2016 Myoview : EF 56%, no ischemia;  c. 12/2016 NSTEMI/PCI: LM nl, LAd 25p, LCX 30p, RCA 100p (2.75x38 Promus Premier DES), mRCA 10 ISR, EF 50-55%.   CKD (chronic kidney disease), stage III (HCC)    Depression    Genetic testing 04/23/2017   Ms. Takemoto underwent genetic counseling and testing for hereditary cancer syndromes on 04/05/2017. Her results were negative for mutations in all 46 genes analyzed by [facility]'s 46-gene Common Hereditary Cancers Panel. Genes analyzed include: APC, ATM, AXIN2, BARD1, BMPR1A, BRCA1, BRCA2, BRIP1, CDH1, CDKN2A, CHEK2, CTNNA1, DICER1, EPCAM, GREM1, HOXB13, KIT, MEN1, MLH1, MSH2, MSH3, MSH6, MUTYH, NBN,   GERD (gastroesophageal reflux disease)    Headache    History of external beam radiation therapy    right breast 07-27-2017  to 09-25-2017   History of non-ST elevation myocardial infarction (NSTEMI)    HOCM (hypertrophic obstructive cardiomyopathy) (HCC) followed by cardiology   a. 12/2016 Echo: EF 65-70%,  mod conc LVH, dynamic obstruction @ rest, peak velocity of 291 cm/sec w/ peak gradient of , no rwma, Gr1 DD, triv TR, PASP .   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[facility]) neurologist--- dr vear   01/ 2017  dx transverse myelitis w/ right side numbness (09-01-2019  currently lower extremity weakness, mucsle spasms, and gait disturbance)   Wears glasses     Past Surgical History:  Procedure Laterality Date   ABDOMINAL AORTOGRAM W/LOWER EXTREMITY N/A 08/11/2021   Procedure: ABDOMINAL AORTOGRAM W/LOWER EXTREMITY;  Surgeon: Court Dorn PARAS, MD;  Location: [facility] INVASIVE CV LAB;  Service: Cardiovascular;  Laterality: N/A;   BREAST LUMPECTOMY WITH RADIOACTIVE SEED AND SENTINEL LYMPH NODE BIOPSY Right 06/21/2017   Procedure: RIGHT BREAST BRACKETED SEED GUIDED LUMPECTOMY AND SENTINEL LYMPH NODE BIOPSY;  Surgeon: Curvin Deward MOULD, MD;  Location: [facility] OR;  Service: General;  Laterality: Right;   CORONARY/GRAFT ACUTE MI REVASCULARIZATION N/A 12/31/2016   Procedure: Coronary/Graft Acute MI Revascularization;  Surgeon: Ozell Fell, MD;  Location: [facility] INVASIVE CV LAB;  Service: Cardiovascular;  Laterality: N/A;   DILATATION & CURETTAGE/HYSTEROSCOPY WITH MYOSURE N/A 09/02/2019   Procedure: DILATATION & CURETTAGE/HYSTEROSCOPY WITH MYOSURE;  Surgeon: Kandyce Sor, MD;  Location: [facility];  Service: Gynecology;  Laterality: N/A;   HERNIA REPAIR     LEFT HEART CATH AND CORONARY ANGIOGRAPHY N/A 12/31/2016   Procedure: Left Heart Cath and Coronary Angiography;  Surgeon: Ozell Fell, MD;  Location: [facility] INVASIVE CV LAB;  Service: Cardiovascular;  Laterality: N/A;   LEFT HEART CATH AND CORONARY ANGIOGRAPHY N/A 02/11/2024   Procedure: LEFT HEART CATH AND CORONARY ANGIOGRAPHY;  Surgeon: Elmira Newman PARAS, MD;  Location: [facility] INVASIVE CV LAB;  Service:  Cardiovascular;  Laterality: N/A;   LEFT HEART CATHETERIZATION WITH CORONARY ANGIOGRAM N/A 09/14/2014   Procedure: LEFT HEART CATHETERIZATION WITH CORONARY ANGIOGRAM;  Surgeon: Lonni JONETTA Cash, MD;  Location: [facility] CATH LAB;  Service: Cardiovascular;  Laterality: N/A;   MASS EXCISION N/A 03/30/2020   Procedure: EXCISION MASS RIGHT LABIA MINORA;  Surgeon: Viktoria Comer SAUNDERS, MD;  Location: [facility] ORS;  Service: Gynecology;  Laterality: N/A;   PERCUTANEOUS CORONARY STENT INTERVENTION (PCI-S)  09/14/2014   Procedure: PERCUTANEOUS CORONARY STENT INTERVENTION (PCI-S);  Surgeon: Lonni JONETTA Cash, MD;  Location: [facility] CATH LAB;  Service: Cardiovascular;;   PERIPHERAL VASCULAR INTERVENTION  08/11/2021   Procedure: PERIPHERAL VASCULAR INTERVENTION;  Surgeon: Court Dorn PARAS, MD;  Location: [facility] INVASIVE CV LAB;  Service: Cardiovascular;;   ROBOTIC ASSISTED SALPINGO OOPHERECTOMY Bilateral 03/30/2020   Procedure: XI ROBOTIC ASSISTED SALPINGO OOPHORECTOMY;  Surgeon: Viktoria Comer SAUNDERS, MD;  Location: [facility] ORS;  Service: Gynecology;  Laterality: Bilateral;   TONSILLECTOMY  2005   UMBILICAL HERNIA REPAIR  2001; 2005    Social History:  reports that she quit smoking about 8 years ago. Her smoking use included cigarettes. She started smoking about 38 years ago. She has a 15 pack-year smoking history. She has never used smokeless tobacco. She reports that she does not currently use alcohol after a past usage of about 2.0 standard drinks of alcohol per week. She reports that she does not use drugs.   Allergies  Allergen Reactions   Pork-Derived Products Other (See Comments)    Does NOT eat pork    Family History  Problem Relation Age of Onset   Diabetes Mother    Heart disease Mother    Hyperlipidemia Mother    Hypertension Mother    Breast cancer Mother    Lung cancer Father    Drug abuse Father    Brain cancer Father 45   Bone cancer Father 8   Drug abuse Sister    Mental illness Brother     Mental illness Maternal Grandmother    Stomach cancer Paternal Grandmother 27       d.75    Family history reviewed and not pertinent    Prior to Admission medications   Medication Sig Start Date End Date Taking? Authorizing Provider  amLODipine  (NORVASC ) 10 MG tablet TAKE 1 TABLET BY MOUTH EVERY DAY 11/20/23   Hilty, Vinie BROCKS, MD  anastrozole  (ARIMIDEX ) 1 MG tablet TAKE 1 TABLET(1 MG) BY MOUTH DAILY Patient taking differently: Take 1 mg by mouth daily. 01/07/24   Gudena, Vinay, MD  aspirin  81 MG chewable tablet Chew 1 tablet (81 mg total) by mouth daily. 09/16/14   Samtani, Jai-Gurmukh, MD  atorvastatin  (LIPITOR ) 80 MG tablet Take 1 tablet (80 mg total) by mouth daily. 11/05/23   Hilty, Vinie BROCKS, MD  baclofen  (LIORESAL ) 10 MG tablet TAKE 1 TABLET BY MOUTH FOUR TIMES DAILY 03/11/24   Sater, Charlie LABOR, MD  carvedilol  (COREG ) 3.125 MG tablet Take 1 tablet (3.125 mg total) by mouth 2 (two) times  daily with a meal. 02/12/24   Mdala-Gausi, Golden Pillow, MD  clopidogrel  (PLAVIX ) 75 MG tablet Take 1 tablet (75 mg total) by mouth daily. 02/12/24   Mdala-Gausi, Masiku Agatha, MD  Evolocumab  (REPATHA  SURECLICK) 140 MG/ML SOAJ Inject 140 mg into the skin every 14 (fourteen) days. 11/05/23   Hilty, Vinie BROCKS, MD  fluticasone  (FLONASE ) 50 MCG/ACT nasal spray Place 1-2 sprays into both nostrils daily as needed for allergies or rhinitis.    [provider]  furosemide  (LASIX ) 20 MG tablet Take 1 tablet (20 mg total) by mouth daily. FOR FLUID RETENTION 02/14/24   Mona Vinie BROCKS, MD  gabapentin  (NEURONTIN ) 300 MG capsule Take 3 capsules (900 MG) by mouth three times daily 12/12/23   Sater, Charlie LABOR, MD  hydrALAZINE  (APRESOLINE ) 50 MG tablet Take 1 tablet (50 mg total) by mouth 3 (three) times daily. 02/13/24   Mdala-Gausi, Masiku Agatha, MD  isosorbide  mononitrate (IMDUR ) 30 MG 24 hr tablet Take 1 tablet (30 mg total) by mouth daily. 02/14/24   Mdala-Gausi, Masiku Agatha, MD  lisinopril  (ZESTRIL ) 20 MG tablet  Take 2 tablets (40 mg total) by mouth daily. 02/12/24   Mdala-Gausi, Masiku Agatha, MD  methocarbamol  (ROBAXIN ) 500 MG tablet TAKE 1 TABLET(500 MG) BY MOUTH THREE TIMES DAILY AS NEEDED FOR MUSCLE SPASMS Patient taking differently: Take 500 mg by mouth 3 (three) times daily as needed for muscle spasms. 01/07/24   Sater, Charlie LABOR, MD  nitroGLYCERIN  (NITROSTAT ) 0.4 MG SL tablet Place 1 tablet (0.4 mg total) under the tongue every 5 (five) minutes as needed for chest pain. PLACE 1 TABLET UNDER THE TONGUE EVERY 5 MINUTES AS NEEDED FOR CHEST PAIN 03/05/24   Rana Lum CROME, NP  oxcarbazepine  (TRILEPTAL ) 600 MG tablet Take 1 tablet (600 mg total) by mouth 3 (three) times daily. 12/12/23   Sater, Charlie LABOR, MD  spironolactone  (ALDACTONE ) 50 MG tablet Take 0.5 tablets (25 mg total) by mouth daily. 02/12/24   Mdala-Gausi, Golden Pillow, MD  traMADol  (ULTRAM ) 50 MG tablet TAKE 1 TABLET(50 MG) BY MOUTH UP TO THREE TIMES A DAILY AS NEEDED Patient taking differently: Take 50 mg by mouth 3 (three) times daily as needed for moderate pain (pain score 4-6) or severe pain (pain score 7-10). 12/12/23   Sater, Charlie LABOR, MD  venlafaxine  XR (EFFEXOR -XR) 75 MG 24 hr capsule TAKE 1 CAPSULE BY MOUTH DAILY WITH BREAKFAST 02/05/24   Odean Potts, MD     Objective    Physical Exam: Vitals:   05/02/24 2230 05/02/24 2243 05/02/24 2300 05/02/24 2315  BP: (!) 191/88 (!) 191/88 (!) 194/93 (!) 181/86  Pulse:    65  Resp: 14  14 16   Temp:      TempSrc:      SpO2:    100%  Weight:      Height:        General: appears to be stated age; alert, oriented Skin: warm, dry, no rash Head:  AT/[place] Mouth:  Oral mucosa membranes appear moist, normal dentition Neck: supple; trachea midline Heart:  RRR; did not appreciate any M/R/G Lungs: CTAB, did not appreciate any wheezes, rales, or rhonchi Abdomen: + BS; soft, ND, NT Vascular: 2+ pedal pulses b/l; 2+ radial pulses b/l Extremities: no peripheral edema, no muscle  wasting    Labs on Admission: I have personally reviewed following labs and imaging studies  CBC: Recent Labs  Lab 05/02/24 2134 05/02/24 2146  WBC 22.7*  --   NEUTROABS 15.2*  --  HGB 14.4 15.3*  HCT 41.5 45.0  MCV 90.0  --   PLT 431*  --    Basic Metabolic Panel: Recent Labs  Lab 05/02/24 2134 05/02/24 2146  NA 134* 135  K 3.1* 2.9*  CL 100 100  CO2 19*  --   GLUCOSE 191* 196*  BUN 10 10  CREATININE 0.93 0.80  CALCIUM  9.6  --    GFR: Estimated Creatinine Clearance: 71.3 mL/min (by C-G formula based on SCr of 0.8 mg/dL). Liver Function Tests: No results for input(s): AST, ALT, ALKPHOS, BILITOT, PROT, ALBUMIN in the last 168 hours. No results for input(s): LIPASE, AMYLASE in the last 168 hours. No results for input(s): AMMONIA in the last 168 hours. Coagulation Profile: No results for input(s): INR, PROTIME in the last 168 hours. Cardiac Enzymes: No results for input(s): CKTOTAL, CKMB, CKMBINDEX, TROPONINI in the last 168 hours. BNP (last 3 results) No results for input(s): PROBNP in the last 8760 hours. HbA1C: No results for input(s): HGBA1C in the last 72 hours. CBG: No results for input(s): GLUCAP in the last 168 hours. Lipid Profile: No results for input(s): CHOL, HDL, LDLCALC, TRIG, CHOLHDL, LDLDIRECT in the last 72 hours. Thyroid  Function Tests: No results for input(s): TSH, T4TOTAL, FREET4, T3FREE, THYROIDAB in the last 72 hours. Anemia Panel: No results for input(s): VITAMINB12, FOLATE, FERRITIN, TIBC, IRON, RETICCTPCT in the last 72 hours. Urine analysis:    Component Value Date/Time   COLORURINE STRAW (A) 02/10/2024 0505   APPEARANCEUR CLEAR 02/10/2024 0505   LABSPEC 1.007 02/10/2024 0505   PHURINE 7.0 02/10/2024 0505   GLUCOSEU >=500 (A) 02/10/2024 0505   HGBUR NEGATIVE 02/10/2024 0505   BILIRUBINUR NEGATIVE 02/10/2024 0505   BILIRUBINUR negative 01/06/2024 1039    BILIRUBINUR neg 01/16/2015 0940   KETONESUR NEGATIVE 02/10/2024 0505   PROTEINUR >=300 (A) 02/10/2024 0505   UROBILINOGEN 0.2 01/06/2024 1039   UROBILINOGEN 1.0 09/13/2014 1529   NITRITE NEGATIVE 02/10/2024 0505   LEUKOCYTESUR NEGATIVE 02/10/2024 0505    Radiological Exams on Admission: DG Chest Port 1 View Result Date: 05/02/2024 CLINICAL DATA:  Chest pain EXAM: PORTABLE CHEST 1 VIEW COMPARISON:  02/10/2024 FINDINGS: The heart size and mediastinal contours are within normal limits. Both lungs are clear. The visualized skeletal structures are unremarkable. IMPRESSION: No active disease. Electronically Signed   By: Oneil Devonshire M.D.   On: 05/02/2024 21:44      Assessment/Plan   Principal Problem:   Atypical chest pain Active Problems:   HLD (hyperlipidemia)   Hypertensive crisis   Hypokalemia   Prolonged QT interval   Depression   Leukocytosis   History of coronary artery disease   GERD (gastroesophageal reflux disease)   History of breast cancer   Peripheral polyneuropathy     #) Atypical chest pain: Single episode of substernal nonradiating chest pain earlier today that was nonexertional and did not improve with sublingual nitroglycerin , but did improve with dose of IV morphine  as well as improvement in initially elevated blood pressure.  Initial high sensitive troponin I is nonelevated at 12, while EKG shows sinus rhythm without any evidence of acute ischemic changes, including no evidence of ST elevation, and cxr shows no evidence of acute cardiopulmonary process, including no evidence of pneumothorax.   Will patient's presenting atypical chest pain with nonelevated troponin and no evidence of acute ischemic changes does not appear to be overtly consistent with ACS, she is at increased risk for another episode of type II NSTEMI as a result of supply/demand  mismatch stemming from presenting hypertensive crisis, particularly given her known history of obstructive lesion within  the left circumflex artery that was not amenable to revascularization for left-sided heart cath in May 2025.  Consequently, We will continue to trend troponin while pursuing further evaluation and management as outlined below, along with further evaluation and management of presenting hypertensive crisis as below .  Will refrain from pursuit of updated CTA dissection study, given that she just underwent a dissection study 2 months ago for similar chest discomfort, which showed no evidence of aortic aneurysm at that time.  Additionally, presentation appears less suggestive of acute pulmonary embolism.  Noncardiac differential includes GERD, given a documented history of such, versus esophagitis versus gastritis versus peptic ulcer disease, particularly in the context of recent initiation of dual antiplatelet therapy.  Will also check lipase and liver enzymes, as further outlined below.  Plan: Continue to trend troponin.  Monitor on telemetry.  As needed IV morphine .  prn GI cocktail for additional chest discomfort.  Protonix  40 mg IV daily.  CMP, lipase in the morning.  Check urinary drug screen.  Continue outpatient aspirin , Plavix , Coreg , lisinopril , as well as high intensity atorvastatin .  Further evaluation management of presenting hypertensive crisis, as below.                           #) Hypertensive crisis: On the basis of presenting systolic blood pressure of 231, which has improved into the 180's mmHg following dose of IV hydralazine  as well as improvement in chest discomfort with IV morphine .  Per initial labs, no evidence of associated endorgan damage, but will continue to closely monitor ensuing troponin trend given increased risk for type II NSTEMI given her obstructive lesion within the left circumflex artery.  No evidence of acute focal neurologic deficits to suggest acute ischemic CVA.  This is all the context of a history of essential hypertension for which she is on  the following antihypertensive medications at home: Lisinopril , Coreg , Norvasc , Lasix , drowsing, Imdur .  Of note, goal reduction in blood pressure over the next 24 hours of 15-35% would be associated with goal blood pressure range in the systolic values of 150-196 mmHg.   Plan: Resume home Coreg , lisinopril , Imdur .  As needed IV hydralazine  for systolic blood pressure greater than 170.  Monitor strict I's and O's and daily weights.  For now, holding home amlodipine  and oral hydralazine  so this did not decrease blood pressure to expediently.  Further trending of troponin, as above.  CMP in the morning.  Monitor on symmetry.  Check urinary drug screen.                     #) Hypokalemia: presenting potassium level noted to be 3.1, which is notable in the context of not only her presenting chest discomfort but also EKG showing evidence of QTc prolongation.    Plan: monitor on tele. KCl 40 meq p.o. x 1 dose now. Add-on serum mag level. CMP, mag level in the AM.                        #) Leukocytosis: Presenting CBC reflects elevated white cell count of 22,700, which is increased relative to most recent prior leukocytic value of 11,600 on 02/13/2024.  Differential includes potential reactive contribution in setting of presenting hypertensive crisis/chest pain, with increased risk for consequential type II NSTEMI, as above.  No evidence at  this time to suggest underlying infectious process, including chest x-ray that showed no evidence of acute cardiopulmonary process, including no evidence of infiltrate.  Will also check urinalysis to further evaluate.  No additional SIRS criteria are met at this time.  Overall, criteria for sepsis are not currently met. Appears hemodynamically stable.  Therefore, will refrain from initiation of antibiotics at this time.  Plan: Repeat CBC with diff in the morning.  Monitor strict I's and O's, daily weights.  Check urinalysis.  Check  urinary drug screen.  Check procalcitonin level.                      #) QTc prolongation: Presenting EKG demonstrates QTc of 501, which appears to be new relative to most recent prior EKG from 02/11/2024 ms. outpatient medications that may be contributing to QTc prolongation: Effexor .  Also relevant is her presenting hypokalemia, as above.  Plan: Monitor on telemetry.  Add-on serum magnesium  level.   Hold outpatient Effexor  for now.  Further evaluation management of presenting hypokalemia, as above..                   # (History of coronary artery disease: Documented history of such, with most recent left-sided coronary angiography in May 2025 notable for obstructive lesion within the left circumflex that was not amenable to revascularization therapy.  Rather, medical management is being pursued, including dual antiplatelet therapy with daily baby aspirin  as well as Plavix , in addition to Coreg , lisinopril , high intensity atorvastatin .   Plan: Further trending of troponin, as above.  Monitor on telemetry.  Continue outpatient daily baby aspirin , Plavix , high intensity atorvastatin , Coreg , lisinopril .                     #) GERD: documented h/o such; however, does not appear that she is on a PPI as an outpatient.  This is notable history given her presenting atypical chest pain..   Plan: Protonix  40 mg IV daily.                    #) Hyperlipidemia: documented h/o such. On high intensity atorvastatin  as outpatient.   Plan: continue home statin.                    #) History of right-sided breast cancer: Documented history of such, for which she is on Arimidex .  Follows with Dr. Gudena as her outpatient oncologist.  Plan: Continue outpatient Arimidex .  Further evaluation/management per outpatient oncology.                     #) Peripheral polyneuropathy) documented resection for which she  is on gabapentin  at home.  Plan: Resume outpatient gabapentin .                    #) Depression: documented h/o such. On Effexor  as outpatient.    Plan: In the context of presenting QTc prolongation, will hold home Effexor  for now.        DVT prophylaxis: SCD's   Code Status: Full code Family Communication: none Disposition Plan: Per Rounding Team Consults called: none;  Admission status: Observation     I SPENT GREATER THAN 75  MINUTES IN CLINICAL CARE TIME/MEDICAL DECISION-MAKING IN COMPLETING THIS ADMISSION.      Eva NOVAK Machelle Raybon DO [facility]  From 7PM - 7AM   05/02/2024, 11:32 PM

## 2024-05-02 NOTE — ED Triage Notes (Signed)
 Patient arrives via [facility] from home for sudden onset of chest pain, describes the pain as squeezing. EMS reports diaphoresis and red vomit but patient reports she had just eaten cherries. Hypertensive at 230/110. 324mg  aspirin  given but vomited right after.   4mg  zofran  and 2 nitroglycerins given PTA.

## 2024-05-02 NOTE — ED Provider Notes (Signed)
 [place] EMERGENCY DEPARTMENT AT [facility] Provider Note   CSN: [id number] Arrival date & time: 05/02/24  2104     Patient presents with: Chest Pain   Anna Henson is a 53 y.o. female.   Patient is a 53 year old female who presents with chest pain.  She has a history of coronary artery disease status post prior stent placement, hypertension, hyperlipidemia, prior breast cancer, transverse myelitis and arterial disease with prior diagnosed renal artery stenosis and iliac artery stenosis status post stent placement in 2022.  She presents with onset of chest pain in her mid chest.  It started about 4:00 this afternoon.  She has had some associated nausea and vomiting.  No associate abdominal pain.  No shortness of breath.  The pain is otherwise nonradiating.  She has had some diaphoresis.  She had similar symptoms in May when she was admitted for chest pain, nausea vomiting and had an NSTEMI.  She had a cardiac catheterization at that point.  She had some stenosis in the circumflex artery but it was too small to intervene.  It was felt that she was having symptoms more related to hypertensive urgency.  She said her blood pressures been elevated today as well.  She has been taking her medications and has not missed any doses.  She denies any headache.  No numbness or weakness to her extremities.       Prior to Admission medications   Medication Sig Start Date End Date Taking? Authorizing Provider  amLODipine  (NORVASC ) 10 MG tablet TAKE 1 TABLET BY MOUTH EVERY DAY 11/20/23   Hilty, Vinie BROCKS, MD  anastrozole  (ARIMIDEX ) 1 MG tablet TAKE 1 TABLET(1 MG) BY MOUTH DAILY Patient taking differently: Take 1 mg by mouth daily. 01/07/24   Gudena, Vinay, MD  aspirin  81 MG chewable tablet Chew 1 tablet (81 mg total) by mouth daily. 09/16/14   Samtani, Jai-Gurmukh, MD  atorvastatin  (LIPITOR ) 80 MG tablet Take 1 tablet (80 mg total) by mouth daily. 11/05/23   Mona Vinie BROCKS, MD  baclofen   (LIORESAL ) 10 MG tablet TAKE 1 TABLET BY MOUTH FOUR TIMES DAILY 03/11/24   Sater, Charlie LABOR, MD  carvedilol  (COREG ) 3.125 MG tablet Take 1 tablet (3.125 mg total) by mouth 2 (two) times daily with a meal. 02/12/24   Mdala-Gausi, Golden Pillow, MD  clopidogrel  (PLAVIX ) 75 MG tablet Take 1 tablet (75 mg total) by mouth daily. 02/12/24   Mdala-Gausi, Golden Pillow, MD  Evolocumab  (REPATHA  SURECLICK) 140 MG/ML SOAJ Inject 140 mg into the skin every 14 (fourteen) days. 11/05/23   Mona Vinie BROCKS, MD  fluticasone  (FLONASE ) 50 MCG/ACT nasal spray Place 1-2 sprays into both nostrils daily as needed for allergies or rhinitis.    [provider]  furosemide  (LASIX ) 20 MG tablet Take 1 tablet (20 mg total) by mouth daily. FOR FLUID RETENTION 02/14/24   Hilty, Vinie BROCKS, MD  gabapentin  (NEURONTIN ) 300 MG capsule Take 3 capsules (900 MG) by mouth three times daily 12/12/23   Sater, Charlie LABOR, MD  hydrALAZINE  (APRESOLINE ) 50 MG tablet Take 1 tablet (50 mg total) by mouth 3 (three) times daily. 02/13/24   Mdala-Gausi, Golden Pillow, MD  isosorbide  mononitrate (IMDUR ) 30 MG 24 hr tablet Take 1 tablet (30 mg total) by mouth daily. 02/14/24   Mdala-Gausi, Masiku Agatha, MD  lisinopril  (ZESTRIL ) 20 MG tablet Take 2 tablets (40 mg total) by mouth daily. 02/12/24   Mdala-Gausi, Masiku Agatha, MD  methocarbamol  (ROBAXIN ) 500 MG tablet TAKE 1 TABLET(500  MG) BY MOUTH THREE TIMES DAILY AS NEEDED FOR MUSCLE SPASMS Patient taking differently: Take 500 mg by mouth 3 (three) times daily as needed for muscle spasms. 01/07/24   Sater, Charlie LABOR, MD  nitroGLYCERIN  (NITROSTAT ) 0.4 MG SL tablet Place 1 tablet (0.4 mg total) under the tongue every 5 (five) minutes as needed for chest pain. PLACE 1 TABLET UNDER THE TONGUE EVERY 5 MINUTES AS NEEDED FOR CHEST PAIN 03/05/24   Rana Lum CROME, NP  oxcarbazepine  (TRILEPTAL ) 600 MG tablet Take 1 tablet (600 mg total) by mouth 3 (three) times daily. 12/12/23   Sater, Charlie LABOR, MD  spironolactone   (ALDACTONE ) 50 MG tablet Take 0.5 tablets (25 mg total) by mouth daily. 02/12/24   Mdala-Gausi, Masiku Agatha, MD  traMADol  (ULTRAM ) 50 MG tablet TAKE 1 TABLET(50 MG) BY MOUTH UP TO THREE TIMES A DAILY AS NEEDED Patient taking differently: Take 50 mg by mouth 3 (three) times daily as needed for moderate pain (pain score 4-6) or severe pain (pain score 7-10). 12/12/23   Sater, Charlie LABOR, MD  venlafaxine  XR (EFFEXOR -XR) 75 MG 24 hr capsule TAKE 1 CAPSULE BY MOUTH DAILY WITH BREAKFAST 02/05/24   Odean Potts, MD    Allergies: Pork-derived products    Review of Systems  Constitutional:  Positive for fatigue. Negative for chills, diaphoresis and fever.  HENT:  Negative for congestion, rhinorrhea and sneezing.   Eyes: Negative.   Respiratory:  Negative for cough, chest tightness and shortness of breath.   Cardiovascular:  Positive for chest pain. Negative for leg swelling.  Gastrointestinal:  Positive for nausea and vomiting. Negative for abdominal pain, blood in stool and diarrhea.  Genitourinary:  Negative for difficulty urinating, flank pain, frequency and hematuria.  Musculoskeletal:  Negative for arthralgias and back pain.  Skin:  Negative for rash.  Neurological:  Negative for dizziness, speech difficulty, weakness, numbness and headaches.    Updated Vital Signs BP (!) 191/88   Pulse 65   Temp (!) 96.3 F (35.7 C) (Axillary)   Resp 14   Ht 5' 2 (1.575 m)   Wt 62.1 kg   LMP 03/06/2020   SpO2 100%   BMI 25.04 kg/m   Physical Exam Constitutional:      Appearance: She is well-developed.  HENT:     Head: Normocephalic and atraumatic.  Eyes:     Pupils: Pupils are equal, round, and reactive to light.  Cardiovascular:     Rate and Rhythm: Normal rate and regular rhythm.     Heart sounds: Normal heart sounds.  Pulmonary:     Effort: Pulmonary effort is normal. No respiratory distress.     Breath sounds: Normal breath sounds. No wheezing or rales.  Chest:     Chest wall: No  tenderness.  Abdominal:     General: Bowel sounds are normal.     Palpations: Abdomen is soft.     Tenderness: There is no abdominal tenderness. There is no guarding or rebound.  Musculoskeletal:        General: Normal range of motion.     Cervical back: Normal range of motion and neck supple.  Lymphadenopathy:     Cervical: No cervical adenopathy.  Skin:    General: Skin is warm and dry.     Findings: No rash.  Neurological:     General: No focal deficit present.     Mental Status: She is alert and oriented to person, place, and time.     (all labs ordered are listed,  but only abnormal results are displayed) Labs Reviewed  BASIC METABOLIC PANEL WITH GFR - Abnormal; Notable for the following components:      Result Value   Sodium 134 (*)    Potassium 3.1 (*)    CO2 19 (*)    Glucose, Bld 191 (*)    All other components within normal limits  CBC WITH DIFFERENTIAL/PLATELET - Abnormal; Notable for the following components:   WBC 22.7 (*)    Platelets 431 (*)    Neutro Abs 15.2 (*)    Lymphs Abs 5.7 (*)    Monocytes Absolute 1.2 (*)    Abs Immature Granulocytes 0.16 (*)    All other components within normal limits  I-STAT CHEM 8, ED - Abnormal; Notable for the following components:   Potassium 2.9 (*)    Glucose, Bld 196 (*)    Calcium , Ion 1.11 (*)    TCO2 21 (*)    Hemoglobin 15.3 (*)    All other components within normal limits  TROPONIN I (HIGH SENSITIVITY)  TROPONIN I (HIGH SENSITIVITY)    EKG: EKG Interpretation Date/Time:  Friday May 02 2024 21:15:21 EDT Ventricular Rate:  77 PR Interval:  140 QRS Duration:  83 QT Interval:  442 QTC Calculation: 501 R Axis:   74  Text Interpretation: Sinus rhythm Probable left atrial enlargement Probable left ventricular hypertrophy Borderline prolonged QT interval since last tracing no significant change Confirmed by Lenor Hollering ([id number]) on 05/02/2024 9:55:48 PM  Radiology: [facility] Chest Port 1 View Result Date:  05/02/2024 CLINICAL DATA:  Chest pain EXAM: PORTABLE CHEST 1 VIEW COMPARISON:  02/10/2024 FINDINGS: The heart size and mediastinal contours are within normal limits. Both lungs are clear. The visualized skeletal structures are unremarkable. IMPRESSION: No active disease. Electronically Signed   By: Oneil Devonshire M.D.   On: 05/02/2024 21:44     Procedures   Medications Ordered in the ED  morphine  (PF) 4 MG/ML injection 4 mg (4 mg Intravenous Given 05/02/24 2141)  hydrALAZINE  (APRESOLINE ) injection 10 mg (10 mg Intravenous Given 05/02/24 2243)                                    Medical Decision Making Amount and/or Complexity of Data Reviewed Labs: ordered. Radiology: ordered.  Risk Prescription drug management. Decision regarding hospitalization.   This patient presents to the ED for concern of chest pain, elevated blood pressure, this involves an extensive number of treatment options, and is a complaint that carries with it a high risk of complications and morbidity.  I considered the following differential and admission for this acute, potentially life threatening condition.  The differential diagnosis includes ACS, hypertensive emergency, aortic dissection, PE, pneumothorax  MDM:    Patient is a 53 year old female who has a history of hypertension and vascular disease who presents with chest pain.  It is in the center of her chest.  Is nonradiating.  No associated symptoms other than nausea, vomiting and diaphoresis.  She was admitted with similar symptoms in May.  At that time she had a CTA which did not show any evidence of dissection.  Her troponins were elevated and she had a cardiac catheterization which showed some disease but nothing that was amenable to stenting.  She says today symptoms are very similar to May.  EKG does not show any ischemic changes.  Her first troponin is negative.  Other labs are nonconcerning.  Given  that she had similar symptoms with a negative CTA in May, I  do not feel that we need to repeat this today.  She does not have other symptoms that would be more concerning for aortic dissection.  She was given dose of pain medication and she has had antiemetics.  She is feeling little bit better but still has some chest discomfort.  Her blood pressure still markedly elevated.  Will give a dose of IV hydralazine .  She does not have any focal neurologic deficits.  Will consult the hospitalist for admission.  Discussed with Dr. Cathern who will admit the patient for further treatment.  CRITICAL CARE Performed by: Andrea Ness Total critical care time: 60 minutes Critical care time was exclusive of separately billable procedures and treating other patients. Critical care was necessary to treat or prevent imminent or life-threatening deterioration. Critical care was time spent personally by me on the following activities: development of treatment plan with patient and/or surrogate as well as nursing, discussions with consultants, evaluation of patient's response to treatment, examination of patient, obtaining history from patient or surrogate, ordering and performing treatments and interventions, ordering and review of laboratory studies, ordering and review of radiographic studies, pulse oximetry and re-evaluation of patient's condition.   (Labs, imaging, consults)  Labs: I Ordered, and personally interpreted labs.  The pertinent results include: Normal creatinine, normal troponin, mild hypokalemia  Imaging Studies ordered: I ordered imaging studies including chest x-ray I independently visualized and interpreted imaging. I agree with the radiologist interpretation  Additional history obtained from chart.  External records from outside source obtained and reviewed including prior notes  Cardiac Monitoring: The patient was maintained on a cardiac monitor.  If on the cardiac monitor, I personally viewed and interpreted the cardiac monitored which showed an  underlying rhythm of: Sinus rhythm  Reevaluation: After the interventions noted above, I reevaluated the patient and found that they have :improved  Social Determinants of Health:    Disposition: Admit to hospital  Co morbidities that complicate the patient evaluation  Past Medical History:  Diagnosis Date   Anemia    Anxiety    Bipolar disorder in full remission (HCC)    CAD in native artery cardiologist--  dr hilty   a. 09/2014 [place]/PCI in setting of [place]  - s/p 2.25 x 12 mm Promus Premier DES to mid RCA;  b. 11/2016 Myoview : EF 56%, no ischemia;  c. 12/2016 NSTEMI/PCI: LM nl, LAd 25p, LCX 30p, RCA 100p (2.75x38 Promus Premier DES), mRCA 10 ISR, EF 50-55%.   CKD (chronic kidney disease), stage III (HCC)    Depression    Genetic testing 04/23/2017   Ms. Linden underwent genetic counseling and testing for hereditary cancer syndromes on 04/05/2017. Her results were negative for mutations in all 46 genes analyzed by [facility]'s 46-gene Common Hereditary Cancers Panel. Genes analyzed include: APC, ATM, AXIN2, BARD1, BMPR1A, BRCA1, BRCA2, BRIP1, CDH1, CDKN2A, CHEK2, CTNNA1, DICER1, EPCAM, GREM1, HOXB13, KIT, MEN1, MLH1, MSH2, MSH3, MSH6, MUTYH, NBN,   GERD (gastroesophageal reflux disease)    Headache    History of external beam radiation therapy    right breast 07-27-2017  to 09-25-2017   History of non-ST elevation myocardial infarction (NSTEMI)    HOCM (hypertrophic obstructive cardiomyopathy) (HCC) followed by cardiology   a. 12/2016 Echo: EF 65-70%,  mod conc LVH, dynamic obstruction @ rest, peak velocity of 291 cm/sec w/ peak gradient of , no rwma, Gr1 DD, triv TR, PASP .   Hyperlipidemia    Hypertension  Malignant neoplasm of lower-outer quadrant of right breast of female, estrogen receptor positive [facility]) oncologist--- dr odean   dx 06/ 2018--- right breast invasive lobular carcinoma, ductal carcinoma w/ LCIS---- 06-21-2017 s/p right breast lumpecotmy w/ sln disseciton;    completed radiation 09-25-2017;  on tamoxifen    Myocardial infarction [facility])    2017   S/P drug eluting coronary stent placement    09-14-2014---  PCI with DES x1 to midRCA;  12-31-2016  PCI with DES x1 to proxRCA   Transverse myelitis [facility]) neurologist--- dr vear   01/ 2017  dx transverse myelitis w/ right side numbness (09-01-2019  currently lower extremity weakness, mucsle spasms, and gait disturbance)   Wears glasses      Medicines Meds ordered this encounter  Medications   morphine  (PF) 4 MG/ML injection 4 mg   hydrALAZINE  (APRESOLINE ) injection 10 mg    I have reviewed the patients home medicines and have made adjustments as needed  Problem List / ED Course: Problem List Items Addressed This Visit       Cardiovascular and Mediastinum   Hypertensive urgency     Other   Chest pain - Primary             Final diagnoses:  Chest pain, unspecified type  Hypertensive urgency    ED Discharge Orders     None          Lenor Hollering, MD 05/02/24 2329

## 2024-05-03 ENCOUNTER — Encounter (HOSPITAL_COMMUNITY): Payer: Self-pay | Admitting: Internal Medicine

## 2024-05-03 DIAGNOSIS — Z87891 Personal history of nicotine dependence: Secondary | ICD-10-CM | POA: Diagnosis not present

## 2024-05-03 DIAGNOSIS — K219 Gastro-esophageal reflux disease without esophagitis: Secondary | ICD-10-CM | POA: Diagnosis present

## 2024-05-03 DIAGNOSIS — I2510 Atherosclerotic heart disease of native coronary artery without angina pectoris: Secondary | ICD-10-CM | POA: Diagnosis not present

## 2024-05-03 DIAGNOSIS — Z8659 Personal history of other mental and behavioral disorders: Secondary | ICD-10-CM | POA: Diagnosis not present

## 2024-05-03 DIAGNOSIS — Z7901 Long term (current) use of anticoagulants: Secondary | ICD-10-CM | POA: Diagnosis not present

## 2024-05-03 DIAGNOSIS — Z853 Personal history of malignant neoplasm of breast: Secondary | ICD-10-CM

## 2024-05-03 DIAGNOSIS — G629 Polyneuropathy, unspecified: Secondary | ICD-10-CM

## 2024-05-03 DIAGNOSIS — D72829 Elevated white blood cell count, unspecified: Secondary | ICD-10-CM | POA: Diagnosis present

## 2024-05-03 DIAGNOSIS — Z8679 Personal history of other diseases of the circulatory system: Secondary | ICD-10-CM

## 2024-05-03 DIAGNOSIS — R0789 Other chest pain: Secondary | ICD-10-CM | POA: Diagnosis not present

## 2024-05-03 LAB — RAPID URINE DRUG SCREEN, HOSP PERFORMED
Amphetamines: NOT DETECTED
Barbiturates: NOT DETECTED
Benzodiazepines: NOT DETECTED
Cocaine: NOT DETECTED
Opiates: POSITIVE — AB
Tetrahydrocannabinol: POSITIVE — AB

## 2024-05-03 LAB — URINALYSIS, COMPLETE (UACMP) WITH MICROSCOPIC
Bacteria, UA: NONE SEEN
Bilirubin Urine: NEGATIVE
Glucose, UA: NEGATIVE mg/dL
Hgb urine dipstick: NEGATIVE
Ketones, ur: NEGATIVE mg/dL
Leukocytes,Ua: NEGATIVE
Nitrite: NEGATIVE
Protein, ur: 30 mg/dL — AB
Specific Gravity, Urine: 1.01 (ref 1.005–1.030)
pH: 7 (ref 5.0–8.0)

## 2024-05-03 LAB — COMPREHENSIVE METABOLIC PANEL WITH GFR
ALT: 18 U/L (ref 0–44)
AST: 23 U/L (ref 15–41)
Albumin: 4 g/dL (ref 3.5–5.0)
Alkaline Phosphatase: 89 U/L (ref 38–126)
Anion gap: 8 (ref 5–15)
BUN: 9 mg/dL (ref 6–20)
CO2: 22 mmol/L (ref 22–32)
Calcium: 9.4 mg/dL (ref 8.9–10.3)
Chloride: 104 mmol/L (ref 98–111)
Creatinine, Ser: 0.85 mg/dL (ref 0.44–1.00)
GFR, Estimated: >60 mL/min (ref >60)
Glucose, Bld: 115 mg/dL — ABNORMAL HIGH (ref 70–99)
Potassium: 4.4 mmol/L (ref 3.5–5.1)
Sodium: 134 mmol/L — ABNORMAL LOW (ref 135–145)
Total Bilirubin: 0.5 mg/dL (ref 0.0–1.2)
Total Protein: 7.1 g/dL (ref 6.5–8.1)

## 2024-05-03 LAB — CBC WITH DIFFERENTIAL/PLATELET
Abs Immature Granulocytes: 0.05 K/uL (ref 0.00–0.07)
Basophils Absolute: 0.1 K/uL (ref 0.0–0.1)
Basophils Relative: 1 %
Eosinophils Absolute: 0.1 K/uL (ref 0.0–0.5)
Eosinophils Relative: 1 %
HCT: 40.9 % (ref 36.0–46.0)
Hemoglobin: 14.4 g/dL (ref 12.0–15.0)
Immature Granulocytes: 0 %
Lymphocytes Relative: 16 %
Lymphs Abs: 2 K/uL (ref 0.7–4.0)
MCH: 31.1 pg (ref 26.0–34.0)
MCHC: 35.2 g/dL (ref 30.0–36.0)
MCV: 88.3 fL (ref 80.0–100.0)
Monocytes Absolute: 0.8 K/uL (ref 0.1–1.0)
Monocytes Relative: 6 %
Neutro Abs: 9.3 K/uL — ABNORMAL HIGH (ref 1.7–7.7)
Neutrophils Relative %: 76 %
Platelets: 395 K/uL (ref 150–400)
RBC: 4.63 MIL/uL (ref 3.87–5.11)
RDW: 14.2 % (ref 11.5–15.5)
WBC: 12.2 K/uL — ABNORMAL HIGH (ref 4.0–10.5)
nRBC: 0 % (ref 0.0–0.2)

## 2024-05-03 LAB — MAGNESIUM
Magnesium: 2.3 mg/dL (ref 1.7–2.4)
Magnesium: 2.3 mg/dL (ref 1.7–2.4)

## 2024-05-03 LAB — PROCALCITONIN: Procalcitonin: <0.1 ng/mL

## 2024-05-03 LAB — TROPONIN I (HIGH SENSITIVITY)
Troponin I (High Sensitivity): 31 ng/L — ABNORMAL HIGH (ref <18)
Troponin I (High Sensitivity): 20 ng/L — ABNORMAL HIGH (ref <18)

## 2024-05-03 LAB — LIPASE, BLOOD: Lipase: 40 U/L (ref 11–51)

## 2024-05-03 MED ORDER — ATORVASTATIN CALCIUM 80 MG PO TABS
80.0000 mg | ORAL_TABLET | Freq: Every day | ORAL | Status: DC
  Administered 2024-05-03: 80 mg via ORAL
  Filled 2024-05-03: qty 1

## 2024-05-03 MED ORDER — CARVEDILOL 3.125 MG PO TABS: 6.2500 mg | ORAL_TABLET | Freq: Two times a day (BID) | ORAL | Status: DC

## 2024-05-03 MED ORDER — CARVEDILOL 3.125 MG PO TABS
3.1250 mg | ORAL_TABLET | Freq: Two times a day (BID) | ORAL | Status: DC
  Administered 2024-05-03: 3.125 mg via ORAL
  Filled 2024-05-03: qty 1

## 2024-05-03 MED ORDER — ANASTROZOLE 1 MG PO TABS
1.0000 mg | ORAL_TABLET | Freq: Every day | ORAL | Status: DC
Start: 2024-05-03 — End: 2024-05-03
  Administered 2024-05-03: 1 mg via ORAL
  Filled 2024-05-03: qty 1

## 2024-05-03 MED ORDER — CARVEDILOL 3.125 MG PO TABS
3.1250 mg | ORAL_TABLET | Freq: Once | ORAL | Status: AC
  Administered 2024-05-03: 3.125 mg via ORAL
  Filled 2024-05-03: qty 1

## 2024-05-03 MED ORDER — HYDRALAZINE HCL 50 MG PO TABS
50.0000 mg | ORAL_TABLET | Freq: Three times a day (TID) | ORAL | Status: DC
Start: 2024-05-03 — End: 2024-05-03
  Administered 2024-05-03 (×2): 50 mg via ORAL
  Filled 2024-05-03 (×2): qty 1

## 2024-05-03 MED ORDER — LISINOPRIL 20 MG PO TABS
40.0000 mg | ORAL_TABLET | Freq: Every day | ORAL | Status: DC
  Administered 2024-05-03: 40 mg via ORAL
  Filled 2024-05-03: qty 2

## 2024-05-03 MED ORDER — CLOPIDOGREL BISULFATE 75 MG PO TABS
75.0000 mg | ORAL_TABLET | Freq: Every day | ORAL | Status: DC
  Administered 2024-05-03: 75 mg via ORAL
  Filled 2024-05-03: qty 1

## 2024-05-03 MED ORDER — CARVEDILOL 6.25 MG PO TABS: 6.2500 mg | ORAL_TABLET | Freq: Two times a day (BID) | ORAL | Status: DC

## 2024-05-03 MED ORDER — SPIRONOLACTONE 25 MG PO TABS
25.0000 mg | ORAL_TABLET | Freq: Every day | ORAL | Status: DC
  Administered 2024-05-03: 25 mg via ORAL
  Filled 2024-05-03: qty 1

## 2024-05-03 MED ORDER — PANTOPRAZOLE SODIUM 40 MG IV SOLR
40.0000 mg | Freq: Every day | INTRAVENOUS | Status: DC
  Administered 2024-05-03: 40 mg via INTRAVENOUS
  Filled 2024-05-03: qty 10

## 2024-05-03 MED ORDER — ORAL CARE MOUTH RINSE: 15.0000 mL | OROMUCOSAL | Status: DC | PRN

## 2024-05-03 MED ORDER — ASPIRIN 81 MG PO CHEW
81.0000 mg | CHEWABLE_TABLET | Freq: Every day | ORAL | Status: DC
  Administered 2024-05-03: 81 mg via ORAL
  Filled 2024-05-03: qty 1

## 2024-05-03 MED ORDER — ALUM & MAG HYDROXIDE-SIMETH 200-200-20 MG/5ML PO SUSP: 30.0000 mL | Freq: Four times a day (QID) | ORAL | Status: DC | PRN

## 2024-05-03 MED ORDER — ISOSORBIDE MONONITRATE ER 30 MG PO TB24
30.0000 mg | ORAL_TABLET | Freq: Every day | ORAL | Status: DC
  Administered 2024-05-03: 30 mg via ORAL
  Filled 2024-05-03: qty 1

## 2024-05-03 MED ORDER — GABAPENTIN 300 MG PO CAPS
900.0000 mg | ORAL_CAPSULE | Freq: Three times a day (TID) | ORAL | Status: DC
  Administered 2024-05-03: 900 mg via ORAL
  Filled 2024-05-03: qty 3

## 2024-05-03 NOTE — Progress Notes (Signed)
 Progress Note    Anna Henson  FMW:[id number] DOB: 02-04-1971  DOA: 05/02/2024 PCP: Patient, No Pcp Per      Brief Narrative:    Medical records reviewed and are as summarized below:  Anna Henson is a 53 y.o. female  with medical history significant for essential hypertension, coronary artery disease, GERD, hyperlipidemia, right-sided breast cancer. She was hospitalized at [facility] from 02/10/2024 to 02/13/2024 with NSTEMI that was felt to be type II in nature in the setting of hypertensive crisis after presenting with similar episode of chest pain.  High sensitive troponin I peaked during that previous hospitalization at 679, with most recent prior high-sensitivity troponin I noted to be 370.  During this most recent prior hospitalization, she underwent left-sided coronary angiography, which showed evidence of an obstructive lesion in the left circumflex that was felt to be not amenable to revascularization.  Rather, medical management is being pursued, the patient noted to be on dual antiplatelet therapy as well as lisinopril , Coreg , Imdur , as well as high intensity atorvastatin .   She presented to the hospital on 05/02/2024 with chest pain that occurred at rest.  It was associated with nausea and diaphoresis.  In the ED, she was found to have hypertensive urgency.  Chest pain improved after she was given IV morphine .         Assessment/Plan:   Principal Problem:   Atypical chest pain Active Problems:   HLD (hyperlipidemia)   Hypertensive crisis   Hypokalemia   Prolonged QT interval   Depression   Leukocytosis   History of coronary artery disease   GERD (gastroesophageal reflux disease)   History of breast cancer   Peripheral polyneuropathy    Body mass index is 24.07 kg/m.   Chest pain, atypical: Resolved. Minimally elevated troponin.  Troponin trend 12, 31 and 20.  ACS has been ruled out. Of note, troponin was up to 500 on 02/10/2024 and went down to 370 on  02/10/2024.   Hypertensive urgency: Continue antihypertensives.  Increase carvedilol  from 3.125 mg to 6.25 mg twice daily.   CAD: Continue aspirin , Plavix , Lipitor , isosorbide  mononitrate   Leukocytosis: Improved from 22.7-12.2.  This was likely reactive.  Procalcitonin negative.  No evidence of infection thus far.   Hypokalemia: Improved.   Prolonged QTc: Improved from 501-447.   Comorbidities include CAD, hyperlipidemia, history of right-sided breast cancer on Arimidex .     Diet Order             Diet regular Room service appropriate? Yes; Fluid consistency: Thin  Diet effective now                            Consultants: None  Procedures: None    Medications:    anastrozole   1 mg Oral Daily   aspirin   81 mg Oral Daily   atorvastatin   80 mg Oral Daily   carvedilol   6.25 mg Oral BID WC   clopidogrel   75 mg Oral Daily   gabapentin   900 mg Oral TID   hydrALAZINE   50 mg Oral Q8H   isosorbide  mononitrate  30 mg Oral Daily   lisinopril   40 mg Oral Daily   pantoprazole  (PROTONIX ) IV  40 mg Intravenous Daily   spironolactone   25 mg Oral Daily   Continuous Infusions:   Anti-infectives (From admission, onward)    None              Family Communication/Anticipated  D/C date and plan/Code Status   DVT prophylaxis: SCDs Start: 05/02/24 2335     Code Status: Full Code  Family Communication: None Disposition Plan: Plan to discharge home   Status is: Observation The patient will require care spanning > 2 midnights and should be moved to inpatient because: Uncontrolled hypertension       Subjective:   Interval events noted.  She complains of nausea.  No chest pain.  She feels a little better.  Objective:    Vitals:   05/03/24 0500 05/03/24 0504 05/03/24 0558 05/03/24 0815  BP:  (!) 189/102 (!) 143/74 (!) 141/78  Pulse:  80 73 68  Resp:   17 16  Temp:   97.7 F (36.5 C) 97.9 F (36.6 C)  TempSrc:   Oral Oral  SpO2:    100% 96%  Weight: 59.7 kg     Height:       No data found.  No intake or output data in the 24 hours ending 05/03/24 0946 Filed Weights   05/02/24 2108 05/03/24 0500  Weight: 62.1 kg 59.7 kg    Exam:  GEN: NAD SKIN: Warm and dry neuro EYES: No icterus ENT: MMM CV: RRR PULM: CTA B ABD: soft, ND, NT, +BS CNS: AAO x 3, non focal EXT: No edema or tenderness        Data Reviewed:   I have personally reviewed following labs and imaging studies:  Labs: Labs show the following:   Basic Metabolic Panel: Recent Labs  Lab 05/02/24 2134 05/02/24 2146 05/02/24 2349 05/03/24 0757  NA 134* 135  --  134*  K 3.1* 2.9*  --  4.4  CL 100 100  --  104  CO2 19*  --   --  22  GLUCOSE 191* 196*  --  115*  BUN 10 10  --  9  CREATININE 0.93 0.80  --  0.85  CALCIUM  9.6  --   --  9.4  MG  --   --  2.3 2.3   GFR Estimated Creatinine Clearance: 61.2 mL/min (by C-G formula based on SCr of 0.85 mg/dL). Liver Function Tests: Recent Labs  Lab 05/03/24 0757  AST 23  ALT 18  ALKPHOS 89  BILITOT 0.5  PROT 7.1  ALBUMIN 4.0   Recent Labs  Lab 05/03/24 0757  LIPASE 40   No results for input(s): AMMONIA in the last 168 hours. Coagulation profile No results for input(s): INR, PROTIME in the last 168 hours.  CBC: Recent Labs  Lab 05/02/24 2134 05/02/24 2146 05/03/24 0757  WBC 22.7*  --  12.2*  NEUTROABS 15.2*  --  9.3*  HGB 14.4 15.3* 14.4  HCT 41.5 45.0 40.9  MCV 90.0  --  88.3  PLT 431*  --  395   Cardiac Enzymes: No results for input(s): CKTOTAL, CKMB, CKMBINDEX, TROPONINI in the last 168 hours. BNP (last 3 results) No results for input(s): PROBNP in the last 8760 hours. CBG: No results for input(s): GLUCAP in the last 168 hours. D-Dimer: No results for input(s): DDIMER in the last 72 hours. Hgb A1c: No results for input(s): HGBA1C in the last 72 hours. Lipid Profile: No results for input(s): CHOL, HDL, LDLCALC, TRIG, CHOLHDL,  LDLDIRECT in the last 72 hours. Thyroid  function studies: No results for input(s): TSH, T4TOTAL, T3FREE, THYROIDAB in the last 72 hours.  Invalid input(s): FREET3 Anemia work up: No results for input(s): VITAMINB12, FOLATE, FERRITIN, TIBC, IRON, RETICCTPCT in the last 72 hours. Sepsis Labs:  Recent Labs  Lab 05/02/24 2134 05/03/24 0757  WBC 22.7* 12.2*    Microbiology No results found for this or any previous visit (from the past 240 hours).  Procedures and diagnostic studies:  DG Chest Port 1 View Result Date: 05/02/2024 CLINICAL DATA:  Chest pain EXAM: PORTABLE CHEST 1 VIEW COMPARISON:  02/10/2024 FINDINGS: The heart size and mediastinal contours are within normal limits. Both lungs are clear. The visualized skeletal structures are unremarkable. IMPRESSION: No active disease. Electronically Signed   By: Oneil Devonshire M.D.   On: 05/02/2024 21:44               LOS: 0 days   Maxima Skelton  [facility]   Pager on [email or web address]. If 7PM-7AM, please contact night-coverage at [email or web address]     05/03/2024, 9:46 AM

## 2024-05-03 NOTE — Progress Notes (Signed)
 At 0439H, patient had an episode of nausea associated with diaphoresis and light headedness. Zofran  PRN given. BP 206/85, HR to 120s. Denies chest pain during the episode. EKG obtained. Patient reports some relief after Zofran  and stated she is not as nauseous and sweaty as earlier. Notified Dr Charlton and and prescribed new orders.    05/03/24 0439  Assess: MEWS Score  Temp 97.8 F (36.6 C)  BP (!) 206/85  MAP (mmHg) 118  Pulse Rate (!) 116  ECG Heart Rate (!) 116  Resp 18  Level of Consciousness Alert  SpO2 100 %  O2 Device Room Air  Assess: MEWS Score  MEWS Temp 0  MEWS Systolic 2  MEWS Pulse 2  MEWS RR 0  MEWS LOC 0  MEWS Score 4  MEWS Score Color Red  Assess: if the MEWS score is Yellow or Red  Were vital signs accurate and taken at a resting state? No, vital signs rechecked  Does the patient meet 2 or more of the SIRS criteria? Yes  Does the patient have a confirmed or suspected source of infection? No  MEWS guidelines implemented  Yes, red  Treat  MEWS Interventions Considered administering scheduled or prn medications/treatments as ordered  Take Vital Signs  Increase Vital Sign Frequency  Red: Q1hr x2, continue Q4hrs until patient remains green for 12hrs  Escalate  MEWS: Escalate Red: Discuss with charge nurse and notify provider. Consider notifying RRT. If remains red for 2 hours consider need for higher level of care  Notify: Charge Nurse/RN  Name of Charge Nurse/RN Notified Willie, RN  Provider Notification  Provider Name/Title Dr Charlton  Date Provider Notified 05/03/24  Time Provider Notified 0451  Method of Notification Page (Secured chat)  Notification Reason Change in status  Provider response See new orders  Date of Provider Response 05/03/24  Time of Provider Response 0500  Assess: SIRS CRITERIA  SIRS Temperature  0  SIRS Respirations  0  SIRS Pulse 1  SIRS WBC 1  SIRS Score Sum  2

## 2024-05-03 NOTE — Discharge Summary (Addendum)
 Physician Discharge Summary   Patient: Anna Henson MRN: [id number] DOB: 11-Feb-1971  Admit date:     05/02/2024  Discharge date: 05/03/24  Discharge Physician: AIDA CHO   PCP: Patient, No Pcp Per   Recommendations at discharge:   Follow-up with PCP in 1 week  Discharge Diagnoses: Principal Problem:   Atypical chest pain Active Problems:   HLD (hyperlipidemia)   Hypertensive crisis   Hypokalemia   Prolonged QT interval   Depression   Leukocytosis   History of coronary artery disease   GERD (gastroesophageal reflux disease)   History of breast cancer   Peripheral polyneuropathy  Resolved Problems:   * No resolved hospital problems. *  Hospital Course:   Anna Henson is a 53 y.o. female  with medical history significant for essential hypertension, coronary artery disease, GERD, hyperlipidemia, right-sided breast cancer. She was hospitalized at [facility] from 02/10/2024 to 02/13/2024 with NSTEMI that was felt to be type II in nature in the setting of hypertensive crisis after presenting with similar episode of chest pain.  High sensitive troponin I peaked during that previous hospitalization at 679, with most recent prior high-sensitivity troponin I noted to be 370.  During this most recent prior hospitalization, she underwent left-sided coronary angiography, which showed evidence of an obstructive lesion in the left circumflex that was felt to be not amenable to revascularization.  Rather, medical management is being pursued, the patient noted to be on dual antiplatelet therapy as well as lisinopril , Coreg , Imdur , as well as high intensity atorvastatin .    She presented to the hospital on 05/02/2024 with chest pain that occurred at rest.  It was associated with nausea and diaphoresis.   In the ED, she was found to have hypertensive urgency.  Chest pain improved after she was given IV morphine .     Assessment and Plan:   Chest pain, atypical: Resolved. Minimally elevated  troponin.  Troponin trend 12, 31 and 20.  ACS has been ruled out. Of note, troponin was up to 500 on 02/10/2024 and went down to 370 on 02/10/2024.     Hypertensive urgency: BP improved to 126/80 prior to discharge.  Continue antihypertensives.  Increased carvedilol  from 3.125 mg to 6.25 mg twice daily. BP was 231/89 on admission.    CAD: Continue aspirin , Plavix , Lipitor , isosorbide  mononitrate     Leukocytosis: Improved from 22.7-12.2.  This was likely reactive.  Procalcitonin negative.  No evidence of infection thus far.     Hypokalemia: Improved.     Prolonged QTc: Improved from 501-447.     Comorbidities include CAD, hyperlipidemia, history of right-sided breast cancer on Arimidex .   Her condition has improved and she is deemed stable for discharge to home today.  The importance of medical adherence was reiterated.       Consultants: None Procedures performed: None Disposition: Home Diet recommendation:  Discharge Diet Orders (From admission, onward)     Start     Ordered   05/03/24 0000  Diet - low sodium heart healthy        05/03/24 1315           Cardiac diet DISCHARGE MEDICATION: Allergies as of 05/03/2024       Reactions   Pork-derived Products Other (See Comments)   Does NOT eat pork        Medication List     TAKE these medications    amLODipine  10 MG tablet Commonly known as: NORVASC  TAKE 1 TABLET BY MOUTH EVERY DAY  anastrozole  1 MG tablet Commonly known as: ARIMIDEX  TAKE 1 TABLET(1 MG) BY MOUTH DAILY   aspirin  81 MG chewable tablet Chew 1 tablet (81 mg total) by mouth daily.   atorvastatin  80 MG tablet Commonly known as: LIPITOR  Take 1 tablet (80 mg total) by mouth daily.   baclofen  10 MG tablet Commonly known as: LIORESAL  TAKE 1 TABLET BY MOUTH FOUR TIMES DAILY   carvedilol  3.125 MG tablet Commonly known as: COREG  Take 2 tablets (6.25 mg total) by mouth 2 (two) times daily with a meal. What changed: how much to take    clopidogrel  75 MG tablet Commonly known as: PLAVIX  Take 1 tablet (75 mg total) by mouth daily.   fluticasone  50 MCG/ACT nasal spray Commonly known as: FLONASE  Place 1-2 sprays into both nostrils daily as needed for allergies or rhinitis.   furosemide  20 MG tablet Commonly known as: LASIX  Take 1 tablet (20 mg total) by mouth daily. FOR FLUID RETENTION   gabapentin  300 MG capsule Commonly known as: NEURONTIN  Take 3 capsules (900 MG) by mouth three times daily   hydrALAZINE  50 MG tablet Commonly known as: APRESOLINE  Take 1 tablet (50 mg total) by mouth 3 (three) times daily.   isosorbide  mononitrate 30 MG 24 hr tablet Commonly known as: IMDUR  Take 1 tablet (30 mg total) by mouth daily.   lisinopril  20 MG tablet Commonly known as: ZESTRIL  Take 2 tablets (40 mg total) by mouth daily.   methocarbamol  500 MG tablet Commonly known as: ROBAXIN  TAKE 1 TABLET(500 MG) BY MOUTH THREE TIMES DAILY AS NEEDED FOR MUSCLE SPASMS   nitroGLYCERIN  0.4 MG SL tablet Commonly known as: NITROSTAT  Place 1 tablet (0.4 mg total) under the tongue every 5 (five) minutes as needed for chest pain. PLACE 1 TABLET UNDER THE TONGUE EVERY 5 MINUTES AS NEEDED FOR CHEST PAIN   oxcarbazepine  600 MG tablet Commonly known as: TRILEPTAL  Take 1 tablet (600 mg total) by mouth 3 (three) times daily.   Repatha  SureClick 140 MG/ML Soaj Generic drug: Evolocumab  Inject 140 mg into the skin every 14 (fourteen) days.   spironolactone  50 MG tablet Commonly known as: ALDACTONE  Take 0.5 tablets (25 mg total) by mouth daily.   traMADol  50 MG tablet Commonly known as: ULTRAM  TAKE 1 TABLET(50 MG) BY MOUTH UP TO THREE TIMES A DAILY AS NEEDED   venlafaxine  XR 75 MG 24 hr capsule Commonly known as: EFFEXOR -XR TAKE 1 CAPSULE BY MOUTH DAILY WITH BREAKFAST        Discharge Exam: Filed Weights   05/02/24 2108 05/03/24 0500  Weight: 62.1 kg 59.7 kg   GEN: NAD SKIN: Warm and dry neuro EYES: No icterus ENT:  MMM CV: RRR PULM: CTA B ABD: soft, ND, NT, +BS CNS: AAO x 3, non focal EXT: No edema or tenderness  Condition at discharge: good  The results of significant diagnostics from this hospitalization (including imaging, microbiology, ancillary and laboratory) are listed below for reference.   Imaging Studies: DG Chest Port 1 View Result Date: 05/02/2024 CLINICAL DATA:  Chest pain EXAM: PORTABLE CHEST 1 VIEW COMPARISON:  02/10/2024 FINDINGS: The heart size and mediastinal contours are within normal limits. Both lungs are clear. The visualized skeletal structures are unremarkable. IMPRESSION: No active disease. Electronically Signed   By: Oneil Devonshire M.D.   On: 05/02/2024 21:44    Microbiology: Results for orders placed or performed during the hospital encounter of 01/06/24  Hsv Culture And Typing     Status: None   Collection Time:  01/06/24 11:13 AM   Specimen: Vaginal; Other  Result Value Ref Range Status   HSV Culture/Type Comment  Final    Comment: (NOTE) Negative No Herpes simplex virus isolated. Performed At: [facility] [address] [place], [place] [id number] Jennette Shorter MD Ey:[id number]    Source of Sample VAGINAL  Final    Comment: Performed at [facility], [address], [place], [place] [address]    Labs: CBC: Recent Labs  Lab 05/02/24 2134 05/02/24 2146 05/03/24 0757  WBC 22.7*  --  12.2*  NEUTROABS 15.2*  --  9.3*  HGB 14.4 15.3* 14.4  HCT 41.5 45.0 40.9  MCV 90.0  --  88.3  PLT 431*  --  395   Basic Metabolic Panel: Recent Labs  Lab 05/02/24 2134 05/02/24 2146 05/02/24 2349 05/03/24 0757  NA 134* 135  --  134*  K 3.1* 2.9*  --  4.4  CL 100 100  --  104  CO2 19*  --   --  22  GLUCOSE 191* 196*  --  115*  BUN 10 10  --  9  CREATININE 0.93 0.80  --  0.85  CALCIUM  9.6  --   --  9.4  MG  --   --  2.3 2.3   Liver Function Tests: Recent Labs  Lab 05/03/24 0757  AST 23  ALT 18  ALKPHOS 89  BILITOT 0.5  PROT 7.1  ALBUMIN  4.0   CBG: No results for input(s): GLUCAP in the last 168 hours.  Discharge time spent: greater than 30 minutes.  Signed: AIDA CHO, MD [facility] 05/03/2024

## 2024-05-03 NOTE — Plan of Care (Signed)
  Problem: Health Behavior/Discharge Planning: Goal: Ability to manage health-related needs will improve Outcome: Progressing   Problem: Clinical Measurements: Goal: Respiratory complications will improve Outcome: Progressing Goal: Cardiovascular complication will be avoided Outcome: Progressing   Problem: Activity: Goal: Risk for activity intolerance will decrease Outcome: Progressing   Problem: Pain Managment: Goal: General experience of comfort will improve and/or be controlled Outcome: Progressing   Problem: Safety: Goal: Ability to remain free from injury will improve Outcome: Progressing   Problem: Activity: Goal: Ability to tolerate increased activity will improve Outcome: Progressing   Problem: Cardiac: Goal: Ability to achieve and maintain adequate cardiovascular perfusion will improve Outcome: Progressing

## 2024-05-03 NOTE — Progress Notes (Signed)
 Patient is severely hypertensive. Plan to resume oral hydralazine  from her home medication list and give AM Coreg  dose now.

## 2024-05-05 ENCOUNTER — Other Ambulatory Visit: Payer: Self-pay | Admitting: Internal Medicine

## 2024-05-12 ENCOUNTER — Other Ambulatory Visit: Payer: Self-pay | Admitting: Internal Medicine

## 2024-05-13 ENCOUNTER — Encounter: Payer: Self-pay | Admitting: Neurology

## 2024-05-13 ENCOUNTER — Other Ambulatory Visit: Payer: Self-pay | Admitting: Neurology

## 2024-05-13 ENCOUNTER — Encounter: Payer: Self-pay | Admitting: Hematology and Oncology

## 2024-05-13 MED ORDER — METHOCARBAMOL 500 MG PO TABS: ORAL_TABLET | ORAL | 1 refills | Status: DC

## 2024-05-13 NOTE — Telephone Encounter (Signed)
 Dr.Sater per note on 12/12/23  Continue oxcarbazepine  1800/day,  gabapentin  2700 mg/day, baclofen  and tramadol .    Can increase tramadol  to up to 3/day /  Follow up scheduled on 07/10/24  Did you want patient to continue to take  Methacarbanol.?  Rx pending to signed

## 2024-05-19 ENCOUNTER — Emergency Department (HOSPITAL_COMMUNITY)

## 2024-05-19 ENCOUNTER — Other Ambulatory Visit: Payer: Self-pay

## 2024-05-19 ENCOUNTER — Observation Stay (HOSPITAL_COMMUNITY)
Admission: EM | Admit: 2024-05-19 | Discharge: 2024-05-20 | Disposition: A | Discharge status: Home / Self Care | Attending: Internal Medicine | Admitting: Internal Medicine

## 2024-05-19 ENCOUNTER — Encounter (HOSPITAL_COMMUNITY): Payer: Self-pay

## 2024-05-19 DIAGNOSIS — I2510 Atherosclerotic heart disease of native coronary artery without angina pectoris: Secondary | ICD-10-CM | POA: Diagnosis present

## 2024-05-19 DIAGNOSIS — Z853 Personal history of malignant neoplasm of breast: Secondary | ICD-10-CM | POA: Insufficient documentation

## 2024-05-19 DIAGNOSIS — D72829 Elevated white blood cell count, unspecified: Secondary | ICD-10-CM | POA: Diagnosis not present

## 2024-05-19 DIAGNOSIS — I160 Hypertensive urgency: Principal | ICD-10-CM | POA: Insufficient documentation

## 2024-05-19 DIAGNOSIS — E785 Hyperlipidemia, unspecified: Secondary | ICD-10-CM | POA: Diagnosis not present

## 2024-05-19 DIAGNOSIS — I10 Essential (primary) hypertension: Secondary | ICD-10-CM | POA: Diagnosis not present

## 2024-05-19 DIAGNOSIS — E871 Hypo-osmolality and hyponatremia: Secondary | ICD-10-CM | POA: Insufficient documentation

## 2024-05-19 DIAGNOSIS — F17220 Nicotine dependence, chewing tobacco, uncomplicated: Secondary | ICD-10-CM | POA: Diagnosis not present

## 2024-05-19 DIAGNOSIS — R030 Elevated blood-pressure reading, without diagnosis of hypertension: Secondary | ICD-10-CM

## 2024-05-19 DIAGNOSIS — I129 Hypertensive chronic kidney disease with stage 1 through stage 4 chronic kidney disease, or unspecified chronic kidney disease: Secondary | ICD-10-CM | POA: Diagnosis not present

## 2024-05-19 DIAGNOSIS — I200 Unstable angina: Secondary | ICD-10-CM | POA: Diagnosis present

## 2024-05-19 DIAGNOSIS — R079 Chest pain, unspecified: Secondary | ICD-10-CM | POA: Diagnosis present

## 2024-05-19 DIAGNOSIS — Z720 Tobacco use: Secondary | ICD-10-CM | POA: Diagnosis present

## 2024-05-19 DIAGNOSIS — N1831 Chronic kidney disease, stage 3a: Secondary | ICD-10-CM | POA: Diagnosis not present

## 2024-05-19 DIAGNOSIS — R072 Precordial pain: Principal | ICD-10-CM

## 2024-05-19 LAB — BASIC METABOLIC PANEL WITH GFR
Anion gap: 12 (ref 5–15)
BUN: 11 mg/dL (ref 6–20)
CO2: 19 mmol/L — ABNORMAL LOW (ref 22–32)
Calcium: 10 mg/dL (ref 8.9–10.3)
Chloride: 100 mmol/L (ref 98–111)
Creatinine, Ser: 0.8 mg/dL (ref 0.44–1.00)
GFR, Estimated: >60 mL/min (ref >60)
Glucose, Bld: 100 mg/dL — ABNORMAL HIGH (ref 70–99)
Potassium: 4.5 mmol/L (ref 3.5–5.1)
Sodium: 131 mmol/L — ABNORMAL LOW (ref 135–145)

## 2024-05-19 LAB — CBC
HCT: 38.2 % (ref 36.0–46.0)
HCT: 39.1 % (ref 36.0–46.0)
Hemoglobin: 13.5 g/dL (ref 12.0–15.0)
Hemoglobin: 13.8 g/dL (ref 12.0–15.0)
MCH: 31 pg (ref 26.0–34.0)
MCH: 30.9 pg (ref 26.0–34.0)
MCHC: 35.3 g/dL (ref 30.0–36.0)
MCHC: 35.3 g/dL (ref 30.0–36.0)
MCV: 87.6 fL (ref 80.0–100.0)
MCV: 87.5 fL (ref 80.0–100.0)
Platelets: 432 K/uL — ABNORMAL HIGH (ref 150–400)
Platelets: 431 K/uL — ABNORMAL HIGH (ref 150–400)
RBC: 4.36 MIL/uL (ref 3.87–5.11)
RBC: 4.47 MIL/uL (ref 3.87–5.11)
RDW: 13.7 % (ref 11.5–15.5)
RDW: 13.6 % (ref 11.5–15.5)
WBC: 10.1 K/uL (ref 4.0–10.5)
WBC: 10.5 K/uL (ref 4.0–10.5)
nRBC: 0 % (ref 0.0–0.2)
nRBC: 0 % (ref 0.0–0.2)

## 2024-05-19 LAB — CREATININE, SERUM
Creatinine, Ser: 0.85 mg/dL (ref 0.44–1.00)
GFR, Estimated: >60 mL/min (ref >60)

## 2024-05-19 LAB — TROPONIN I (HIGH SENSITIVITY)
Troponin I (High Sensitivity): 10 ng/L (ref <18)
Troponin I (High Sensitivity): 9 ng/L (ref <18)

## 2024-05-19 MED ORDER — ANASTROZOLE 1 MG PO TABS
1.0000 mg | ORAL_TABLET | Freq: Every day | ORAL | Status: DC
  Administered 2024-05-20 (×2): 1 mg via ORAL
  Filled 2024-05-19: qty 1

## 2024-05-19 MED ORDER — CLOPIDOGREL BISULFATE 75 MG PO TABS
75.0000 mg | ORAL_TABLET | Freq: Every day | ORAL | Status: DC
  Administered 2024-05-20 (×2): 75 mg via ORAL
  Filled 2024-05-19: qty 1

## 2024-05-19 MED ORDER — HYDRALAZINE HCL 25 MG PO TABS
50.0000 mg | ORAL_TABLET | Freq: Once | ORAL | Status: AC
  Administered 2024-05-19 (×2): 50 mg via ORAL
  Filled 2024-05-19: qty 2

## 2024-05-19 MED ORDER — BACLOFEN 10 MG PO TABS
10.0000 mg | ORAL_TABLET | Freq: Four times a day (QID) | ORAL | Status: DC
  Administered 2024-05-19 – 2024-05-20 (×4): 10 mg via ORAL
  Filled 2024-05-19 (×2): qty 1

## 2024-05-19 MED ORDER — FAMOTIDINE 20 MG PO TABS
20.0000 mg | ORAL_TABLET | Freq: Once | ORAL | Status: AC
  Administered 2024-05-19 (×2): 20 mg via ORAL
  Filled 2024-05-19: qty 1

## 2024-05-19 MED ORDER — HYDRALAZINE HCL 20 MG/ML IJ SOLN
10.0000 mg | Freq: Once | INTRAMUSCULAR | Status: AC
  Administered 2024-05-19 (×2): 10 mg via INTRAVENOUS
  Filled 2024-05-19: qty 1

## 2024-05-19 MED ORDER — OXCARBAZEPINE 300 MG PO TABS
600.0000 mg | ORAL_TABLET | Freq: Three times a day (TID) | ORAL | Status: DC
  Administered 2024-05-19 – 2024-05-20 (×4): 600 mg via ORAL
  Filled 2024-05-19 (×2): qty 2

## 2024-05-19 MED ORDER — AMLODIPINE BESYLATE 10 MG PO TABS
10.0000 mg | ORAL_TABLET | Freq: Every day | ORAL | Status: DC
  Administered 2024-05-20 (×2): 10 mg via ORAL
  Filled 2024-05-19: qty 1

## 2024-05-19 MED ORDER — ALUM & MAG HYDROXIDE-SIMETH 200-200-20 MG/5ML PO SUSP
30.0000 mL | Freq: Once | ORAL | Status: AC
  Administered 2024-05-19 (×2): 30 mL via ORAL
  Filled 2024-05-19: qty 30

## 2024-05-19 MED ORDER — PROCHLORPERAZINE EDISYLATE 10 MG/2ML IJ SOLN
10.0000 mg | Freq: Once | INTRAMUSCULAR | Status: AC
  Administered 2024-05-19 (×2): 10 mg via INTRAVENOUS
  Filled 2024-05-19: qty 2

## 2024-05-19 MED ORDER — ASPIRIN 81 MG PO CHEW
81.0000 mg | CHEWABLE_TABLET | Freq: Every day | ORAL | Status: DC
  Administered 2024-05-20 (×2): 81 mg via ORAL
  Filled 2024-05-19: qty 1

## 2024-05-19 MED ORDER — CARVEDILOL 6.25 MG PO TABS
6.2500 mg | ORAL_TABLET | Freq: Two times a day (BID) | ORAL | Status: DC
  Administered 2024-05-20 (×2): 6.25 mg via ORAL
  Filled 2024-05-19: qty 1

## 2024-05-19 MED ORDER — TRAMADOL HCL 50 MG PO TABS: 50.0000 mg | ORAL_TABLET | Freq: Four times a day (QID) | ORAL | Status: DC | PRN

## 2024-05-19 MED ORDER — ONDANSETRON HCL 4 MG/2ML IJ SOLN: 4.0000 mg | Freq: Once | INTRAMUSCULAR | Status: DC

## 2024-05-19 MED ORDER — LISINOPRIL 20 MG PO TABS
40.0000 mg | ORAL_TABLET | Freq: Every day | ORAL | Status: DC
  Administered 2024-05-20 (×2): 40 mg via ORAL
  Filled 2024-05-19: qty 2

## 2024-05-19 MED ORDER — FUROSEMIDE 20 MG PO TABS
20.0000 mg | ORAL_TABLET | Freq: Every day | ORAL | Status: DC
  Administered 2024-05-20 (×2): 20 mg via ORAL
  Filled 2024-05-19: qty 1

## 2024-05-19 MED ORDER — SPIRONOLACTONE 25 MG PO TABS
50.0000 mg | ORAL_TABLET | Freq: Every day | ORAL | Status: DC
  Administered 2024-05-20 (×2): 50 mg via ORAL
  Filled 2024-05-19: qty 2

## 2024-05-19 MED ORDER — ONDANSETRON 4 MG PO TBDP
4.0000 mg | ORAL_TABLET | Freq: Once | ORAL | Status: AC
  Administered 2024-05-19 (×2): 4 mg via ORAL
  Filled 2024-05-19: qty 1

## 2024-05-19 MED ORDER — MORPHINE SULFATE (PF) 2 MG/ML IV SOLN: 2.0000 mg | INTRAVENOUS | Status: DC | PRN

## 2024-05-19 MED ORDER — VENLAFAXINE HCL ER 75 MG PO CP24
75.0000 mg | ORAL_CAPSULE | Freq: Every day | ORAL | Status: DC
  Administered 2024-05-20 (×2): 75 mg via ORAL
  Filled 2024-05-19 (×2): qty 1

## 2024-05-19 MED ORDER — METHOCARBAMOL 500 MG PO TABS: 500.0000 mg | ORAL_TABLET | Freq: Four times a day (QID) | ORAL | Status: DC | PRN

## 2024-05-19 MED ORDER — HYDRALAZINE HCL 25 MG PO TABS
25.0000 mg | ORAL_TABLET | Freq: Three times a day (TID) | ORAL | Status: DC
  Administered 2024-05-20 (×4): 25 mg via ORAL
  Filled 2024-05-19 (×2): qty 1

## 2024-05-19 MED ORDER — NITROGLYCERIN 0.4 MG SL SUBL: 0.4000 mg | SUBLINGUAL_TABLET | SUBLINGUAL | Status: DC | PRN

## 2024-05-19 MED ORDER — FLUTICASONE PROPIONATE 50 MCG/ACT NA SUSP: 1.0000 | Freq: Every day | NASAL | Status: DC | PRN

## 2024-05-19 MED ORDER — ACETAMINOPHEN 500 MG PO TABS
1000.0000 mg | ORAL_TABLET | Freq: Once | ORAL | Status: AC
  Administered 2024-05-19 (×2): 1000 mg via ORAL

## 2024-05-19 MED ORDER — ATORVASTATIN CALCIUM 80 MG PO TABS
80.0000 mg | ORAL_TABLET | Freq: Every day | ORAL | Status: DC
Start: 2024-05-20 — End: 2024-05-20
  Administered 2024-05-20 (×2): 80 mg via ORAL
  Filled 2024-05-19: qty 1

## 2024-05-19 MED ORDER — ENOXAPARIN SODIUM 40 MG/0.4ML IJ SOSY: 40.0000 mg | PREFILLED_SYRINGE | INTRAMUSCULAR | Status: DC

## 2024-05-19 MED ORDER — GABAPENTIN 300 MG PO CAPS
900.0000 mg | ORAL_CAPSULE | Freq: Three times a day (TID) | ORAL | Status: DC
  Administered 2024-05-19 – 2024-05-20 (×4): 900 mg via ORAL
  Filled 2024-05-19 (×2): qty 3

## 2024-05-19 MED ORDER — DIPHENHYDRAMINE HCL 50 MG/ML IJ SOLN
12.5000 mg | Freq: Once | INTRAMUSCULAR | Status: AC
  Administered 2024-05-19 (×2): 12.5 mg via INTRAVENOUS
  Filled 2024-05-19: qty 1

## 2024-05-19 MED ORDER — CARVEDILOL 3.125 MG PO TABS
6.2500 mg | ORAL_TABLET | Freq: Once | ORAL | Status: AC
  Administered 2024-05-19 (×2): 6.25 mg via ORAL
  Filled 2024-05-19: qty 2

## 2024-05-19 MED ORDER — ISOSORBIDE MONONITRATE ER 30 MG PO TB24
30.0000 mg | ORAL_TABLET | Freq: Every day | ORAL | Status: DC
  Administered 2024-05-20 (×2): 30 mg via ORAL
  Filled 2024-05-19: qty 1

## 2024-05-19 NOTE — ED Provider Notes (Signed)
 [place] EMERGENCY DEPARTMENT AT [facility] Provider Note   CSN: [id number] Arrival date & time: 05/19/24  1208     Patient presents with: Hypertension and Chest Pain   Anna Henson is a 53 y.o. female.   Pt with c/o concern for high blood pressure (reported as 212/115 at home), and states in past day mid chest pain at rest, dull, non radiating, not pleuritic, not positional, without specific exacerbating or alleviating factors. Hx htn, states compliant w meds, no recent changes in meds, and has taken today. Hx cad/prior stent, unsure if pain similar. No recent exertional chest pain. No associated sob, nv or diaphoresis. No cough or uri symptoms. No heartburn/indigestion. No recent chest wall injury or strain. No leg pain or swelling. Compliant w home meds. No fever or chills. Denies any acute, abrupt, or severe headaches (had mild headache earlier). No change in speech or vision. No new numbness/weakness or change in normal functional ability.   The history is provided by the patient and medical records.  Hypertension Associated symptoms include chest pain. Pertinent negatives include no abdominal pain and no shortness of breath.  Chest Pain Associated symptoms: no abdominal pain, no back pain, no cough, no fever, no nausea, no numbness, no palpitations, no shortness of breath, no vomiting and no weakness        Prior to Admission medications   Medication Sig Start Date End Date Taking? Authorizing Provider  anastrozole  (ARIMIDEX ) 1 MG tablet TAKE 1 TABLET(1 MG) BY MOUTH DAILY 01/07/24  Yes Gudena, Vinay, MD  aspirin  81 MG chewable tablet Chew 1 tablet (81 mg total) by mouth daily. 09/16/14  Yes Samtani, Jai-Gurmukh, MD  atorvastatin  (LIPITOR ) 80 MG tablet Take 1 tablet (80 mg total) by mouth daily. 11/05/23  Yes Hilty, Vinie BROCKS, MD  baclofen  (LIORESAL ) 10 MG tablet TAKE 1 TABLET BY MOUTH FOUR TIMES DAILY 03/11/24  Yes Sater, Charlie LABOR, MD  carvedilol  (COREG ) 3.125 MG  tablet Take 2 tablets (6.25 mg total) by mouth 2 (two) times daily with a meal. 05/03/24  Yes Jens Durand, MD  clopidogrel  (PLAVIX ) 75 MG tablet Take 1 tablet (75 mg total) by mouth daily. 02/12/24  Yes Mdala-Gausi, Masiku Agatha, MD  Evolocumab  (REPATHA  SURECLICK) 140 MG/ML SOAJ ADMINISTER 1 ML UNDER THE SKIN EVERY 14 DAYS 05/05/24  Yes Hilty, Vinie BROCKS, MD  fluticasone  (FLONASE ) 50 MCG/ACT nasal spray Place 1-2 sprays into both nostrils daily as needed for allergies or rhinitis.   Yes [provider]  furosemide  (LASIX ) 20 MG tablet Take 1 tablet (20 mg total) by mouth daily. FOR FLUID RETENTION 02/14/24  Yes Hilty, Vinie BROCKS, MD  gabapentin  (NEURONTIN ) 300 MG capsule Take 3 capsules (900 MG) by mouth three times daily 12/12/23  Yes Sater, Charlie LABOR, MD  hydrALAZINE  (APRESOLINE ) 50 MG tablet Take 1 tablet (50 mg total) by mouth 3 (three) times daily. 02/13/24  Yes Mdala-Gausi, Masiku Agatha, MD  isosorbide  mononitrate (IMDUR ) 30 MG 24 hr tablet Take 1 tablet (30 mg total) by mouth daily. 02/14/24  Yes Mdala-Gausi, Masiku Agatha, MD  lisinopril  (ZESTRIL ) 20 MG tablet Take 2 tablets (40 mg total) by mouth daily. 02/12/24  Yes Mdala-Gausi, Masiku Agatha, MD  methocarbamol  (ROBAXIN ) 500 MG tablet TAKE 1 TABLET(500 MG) BY MOUTH THREE TIMES DAILY AS NEEDED FOR MUSCLE SPASMS 05/13/24  Yes Sater, Charlie LABOR, MD  nitroGLYCERIN  (NITROSTAT ) 0.4 MG SL tablet Place 1 tablet (0.4 mg total) under the tongue every 5 (five) minutes as needed for  chest pain. PLACE 1 TABLET UNDER THE TONGUE EVERY 5 MINUTES AS NEEDED FOR CHEST PAIN 03/05/24  Yes Fountain, Madison L, NP  oxcarbazepine  (TRILEPTAL ) 600 MG tablet Take 1 tablet (600 mg total) by mouth 3 (three) times daily. 12/12/23  Yes Sater, Charlie LABOR, MD  spironolactone  (ALDACTONE ) 50 MG tablet TAKE 1 TABLET EACH MORNING AND 1/2 TABLET IN EVENING 05/12/24  Yes Mdala-Gausi, Masiku Agatha, MD  traMADol  (ULTRAM ) 50 MG tablet TAKE 1 TABLET(50 MG) BY MOUTH UP TO THREE TIMES A DAILY AS  NEEDED 12/12/23  Yes Sater, Charlie LABOR, MD  venlafaxine  XR (EFFEXOR -XR) 75 MG 24 hr capsule TAKE 1 CAPSULE BY MOUTH DAILY WITH BREAKFAST 02/05/24  Yes Gudena, Vinay, MD  amLODipine  (NORVASC ) 10 MG tablet TAKE 1 TABLET BY MOUTH EVERY DAY Patient not taking: Reported on 05/19/2024 11/20/23   Mona Vinie BROCKS, MD    Allergies: Pork-derived products    Review of Systems  Constitutional:  Negative for chills and fever.  HENT:  Negative for sore throat.   Eyes:  Negative for visual disturbance.  Respiratory:  Negative for cough and shortness of breath.   Cardiovascular:  Positive for chest pain. Negative for palpitations and leg swelling.  Gastrointestinal:  Negative for abdominal pain, nausea and vomiting.  Genitourinary:  Negative for flank pain.  Musculoskeletal:  Negative for back pain and neck pain.  Neurological:  Negative for speech difficulty, weakness and numbness.    Updated Vital Signs BP (!) 142/69   Pulse 64   Temp 97.9 F (36.6 C)   Resp 16   Ht 1.575 m (5' 2)   Wt 65.8 kg   LMP 03/06/2020   SpO2 97%   BMI 26.52 kg/m   Physical Exam Vitals and nursing note reviewed.  Constitutional:      Appearance: Normal appearance. She is well-developed.  HENT:     Head: Atraumatic.     Comments: No sinus or temporal tenderness.     Nose: Nose normal.     Mouth/Throat:     Mouth: Mucous membranes are moist.     Pharynx: Oropharynx is clear.  Eyes:     General: No scleral icterus.    Conjunctiva/sclera: Conjunctivae normal.     Pupils: Pupils are equal, round, and reactive to light.  Neck:     Vascular: No carotid bruit.     Trachea: No tracheal deviation.  Cardiovascular:     Rate and Rhythm: Normal rate and regular rhythm.     Pulses: Normal pulses.     Heart sounds: Normal heart sounds. No murmur heard.    No friction rub. No gallop.  Pulmonary:     Effort: Pulmonary effort is normal. No respiratory distress.     Breath sounds: Normal breath sounds.  Chest:      Chest wall: No tenderness.  Abdominal:     General: Bowel sounds are normal. There is no distension.     Palpations: Abdomen is soft.     Tenderness: There is no abdominal tenderness. There is no guarding.  Genitourinary:    Comments: No cva tenderness.  Musculoskeletal:        General: No swelling or tenderness.     Cervical back: Normal range of motion and neck supple. No rigidity. No muscular tenderness.     Right lower leg: No edema.     Left lower leg: No edema.  Skin:    General: Skin is warm and dry.     Findings: No rash.  Neurological:  Mental Status: She is alert.     Comments: Alert, speech normal. Motor/sens grossly intact bil. Stre 5/5. No pronator drift.   Psychiatric:        Mood and Affect: Mood normal.     (all labs ordered are listed, but only abnormal results are displayed) Results for orders placed or performed during the hospital encounter of 05/19/24  Basic metabolic panel   Collection Time: 05/19/24 12:23 PM  Result Value Ref Range   Sodium 131 (L) 135 - 145 mmol/L   Potassium 4.5 3.5 - 5.1 mmol/L   Chloride 100 98 - 111 mmol/L   CO2 19 (L) 22 - 32 mmol/L   Glucose, Bld 100 (H) 70 - 99 mg/dL   BUN 11 6 - 20 mg/dL   Creatinine, Ser 9.19 0.44 - 1.00 mg/dL   Calcium  10.0 8.9 - 10.3 mg/dL   GFR, Estimated >39 >39 mL/min   Anion gap 12 5 - 15  CBC   Collection Time: 05/19/24 12:23 PM  Result Value Ref Range   WBC 10.1 4.0 - 10.5 K/uL   RBC 4.36 3.87 - 5.11 MIL/uL   Hemoglobin 13.5 12.0 - 15.0 g/dL   HCT 61.7 63.9 - 53.9 %   MCV 87.6 80.0 - 100.0 fL   MCH 31.0 26.0 - 34.0 pg   MCHC 35.3 30.0 - 36.0 g/dL   RDW 86.2 88.4 - 84.4 %   Platelets 432 (H) 150 - 400 K/uL   nRBC 0.0 0.0 - 0.2 %  Troponin I (High Sensitivity)   Collection Time: 05/19/24 12:23 PM  Result Value Ref Range   Troponin I (High Sensitivity) 10 <18 ng/L   DG Chest 2 View Result Date: 05/19/2024 EXAM: 2 VIEW(S) XRAY OF THE CHEST 05/19/2024 01:07:00 PM COMPARISON: 05/02/2024  CLINICAL HISTORY: Chest pain. Patient states having chest pain that began last night along with some nausea. She states having a little SOB but no cough. She also states that her blood pressure was high this morning when she woke up. FINDINGS: LUNGS AND PLEURA: No focal pulmonary opacity. No pulmonary edema. No pleural effusion. No pneumothorax. HEART AND MEDIASTINUM: No acute abnormality of the cardiac and mediastinal silhouettes. BONES AND SOFT TISSUES: No acute osseous abnormality. Surgical clips within the right axilla and right chest wall/breast. IMPRESSION: 1. No acute process. Electronically signed by: Rockey Kilts MD 05/19/2024 02:05 PM EDT RP Workstation: [id number]   DG Chest Port 1 View Result Date: 05/02/2024 CLINICAL DATA:  Chest pain EXAM: PORTABLE CHEST 1 VIEW COMPARISON:  02/10/2024 FINDINGS: The heart size and mediastinal contours are within normal limits. Both lungs are clear. The visualized skeletal structures are unremarkable. IMPRESSION: No active disease. Electronically Signed   By: Oneil Devonshire M.D.   On: 05/02/2024 21:44     EKG: EKG Interpretation Date/Time:  Monday May 19 2024 12:26:19 EDT Ventricular Rate:  64 PR Interval:  140 QRS Duration:  86 QT Interval:  426 QTC Calculation: 439 R Axis:   62  Text Interpretation: Normal sinus rhythm Non-specific ST-t changes No significant change since last tracing Confirmed by Bernard Drivers ([id number]) on 05/19/2024 1:46:22 PM  Radiology: [facility] Chest 2 View Result Date: 05/19/2024 EXAM: 2 VIEW(S) XRAY OF THE CHEST 05/19/2024 01:07:00 PM COMPARISON: 05/02/2024 CLINICAL HISTORY: Chest pain. Patient states having chest pain that began last night along with some nausea. She states having a little SOB but no cough. She also states that her blood pressure was high this morning when she woke up. FINDINGS:  LUNGS AND PLEURA: No focal pulmonary opacity. No pulmonary edema. No pleural effusion. No pneumothorax. HEART AND MEDIASTINUM: No acute  abnormality of the cardiac and mediastinal silhouettes. BONES AND SOFT TISSUES: No acute osseous abnormality. Surgical clips within the right axilla and right chest wall/breast. IMPRESSION: 1. No acute process. Electronically signed by: Rockey Kilts MD 05/19/2024 02:05 PM EDT RP Workstation: [id number]     Procedures   Medications Ordered in the ED  acetaminophen  (TYLENOL ) tablet 1,000 mg (1,000 mg Oral Given 05/19/24 1418)  alum & mag hydroxide-simeth (MAALOX/MYLANTA) 200-200-20 MG/5ML suspension 30 mL (30 mLs Oral Given 05/19/24 1418)  famotidine  (PEPCID ) tablet 20 mg (20 mg Oral Given 05/19/24 1417)                                    Medical Decision Making Problems Addressed: Elevated blood pressure reading: acute illness or injury Essential hypertension: chronic illness or injury with exacerbation, progression, or side effects of treatment that poses a threat to life or bodily functions Precordial chest pain: acute illness or injury with systemic symptoms that poses a threat to life or bodily functions  Amount and/or Complexity of Data Reviewed External Data Reviewed: notes. Labs: ordered. Decision-making details documented in ED Course. Radiology: ordered and independent interpretation performed. Decision-making details documented in ED Course. ECG/medicine tests: ordered and independent interpretation performed. Decision-making details documented in ED Course.  Risk OTC drugs. Decision regarding hospitalization.   Iv ns. Continuous pulse ox and cardiac monitoring. Labs ordered/sent. Imaging ordered.   Differential diagnosis includes acs, msk cp, gi cp, htn, etc. Dispo decision including potential need for admission considered - will get labs and imaging and reassess.   Reviewed nursing notes and prior charts for additional history. External reports reviewed.  Cardiac monitor: sinus rhythm, rate 70.  Labs reviewed/interpreted by me - wbc and hgb normal. Chem largely  unremarkable. Trop normal.   Xrays reviewed/interpreted by me - no pna.   Recheck, bp much improved and does not require emergent lowering in ED.    Vitals:   05/19/24 1347 05/19/24 1400  BP: (!) 150/66 (!) 142/69  Pulse: 62 64  Resp: 16 16  Temp:    SpO2: 100% 97%    Additional labs reviewed/interpreted by me - delta trop normal/not significantly increasing.   Recheck no chest pain or sob. No pain/discomfort.  Pt currently appears stable for ED d/c.   Rec close pcp and cardiology f/u.   1605, delta trop pending - signed out to Dr Avonne to f/u with delta trop, recheck pt and dispo appropriately (if remains asymptomatic and delta trop not increasing, feel will likely be stable for d/c w close outpatient f/u).        Final diagnoses:  Precordial chest pain  Elevated blood pressure reading  Essential hypertension    ED Discharge Orders     None          Bernard Drivers, MD 05/19/24 1606

## 2024-05-19 NOTE — Discharge Instructions (Signed)
 It was our pleasure to provide your ER care today - we hope that you feel better.  For recent chest pain and your blood pressure, follow up closely with cardiologist in the next 1-2 weeks - we made referral, and they should be contacting you with an appointment in the next few days (also discuss plan regarding your recent imaging that showed renal artery stenosis, which can cause high and/or difficult to control blood pressure).   Return to ER right away if worse, new symptoms, fevers, recurrent/persistent chest pain, increased trouble breathing, new/severe headache, numbness/weakness, fainting, or other concern.

## 2024-05-19 NOTE — Progress Notes (Signed)
 Room is getting cleaned now.

## 2024-05-19 NOTE — ED Triage Notes (Signed)
 PT endorses BP at home of 212/115.  States she is current on her BP meds.  Also endorses 6/10 CP with nausea and HA.  She states she took one nitroglycerin  20 min prior to arrival.

## 2024-05-19 NOTE — ED Provider Notes (Signed)
 Repeat troponin is unremarkable.  On my evaluation she is having worsening headache, nausea and vomiting.  Blood pressure is 250 systolic.  Home blood pressure medications were given IV hydralazine  given CT scan obtained which was unremarkable.  Headache cocktail also given.  Headaches improved.  Blood pressures improved to 180 systolic.  However she has had multiple blood pressures greater than 220 with chest pain headaches.  I think she benefit from observation stay for calibration of her blood pressure medications.  Right now she is responding to the PRNs.  Overall we will admit to hospitalist service for further care.  This chart was dictated using voice recognition software.  Despite best efforts to proofread,  errors can occur which can change the documentation meaning.    Ruthe Cornet, DO 05/19/24 1945

## 2024-05-19 NOTE — H&P (Signed)
 History and Physical    Patient: Anna Henson FMW:[id number] DOB: 1971/05/08 DOA: 05/19/2024 DOS: the patient was seen and examined on 05/19/2024 PCP: Patient, No Pcp Per  Patient coming from: Home  Chief Complaint:  Chief Complaint  Patient presents with   Hypertension   Chest Pain   HPI: Anna Henson is a 53 y.o. female with medical history significant of essential hypertension, bipolar disorder, coronary artery disease, anxiety disorder, anemia of chronic disease, GERD, history of HOCM, hyperlipidemia, chronic kidney disease stage III, who presented to the ER from home with uncontrolled blood pressure.  Patient blood pressure was 212/115 on arrival.  Patient also having some chest pain rated as 6 out of 10 with some nausea and headache.  She took her nitroglycerin  prior to arrival to the ER.  Despite that she was having some discomfort.  Patient was treated with her home regimen of blood pressure medications which is extensive.  Blood pressure has improved but still systolic was in the 180s to 190s.  She has been admitted for hypertensive urgency with malignant hypertension.  Goal is for observation and control blood pressure.  Initial EKG and enzymes were negative.  Patient says she has been compliant.  She was recently hospitalized on July 25 as well as in May of this year with similar presentation considered to be NSTEMI type II and atypical chest pain.  Patient was worked up completely on discharged.  Review of Systems: As mentioned in the history of present illness. All other systems reviewed and are negative. Past Medical History:  Diagnosis Date   Anemia    Anxiety    Bipolar disorder in full remission (HCC)    CAD in native artery cardiologist--  dr hilty   a. 09/2014 [place] in setting of [place]  - s/p 2.25 x 12 mm Promus Premier DES to mid RCA;  b. 11/2016 Myoview : EF 56%, no ischemia;  c. 12/2016 NSTEMI/PCI: LM nl, LAd 25p, LCX 30p, RCA 100p (2.75x38 Promus Premier  DES), mRCA 10 ISR, EF 50-55%.   CKD (chronic kidney disease), stage III (HCC)    Depression    Genetic testing 04/23/2017   Ms. Schriver underwent genetic counseling and testing for hereditary cancer syndromes on 04/05/2017. Her results were negative for mutations in all 46 genes analyzed by [facility]'s 46-gene Common Hereditary Cancers Panel. Genes analyzed include: APC, ATM, AXIN2, BARD1, BMPR1A, BRCA1, BRCA2, BRIP1, CDH1, CDKN2A, CHEK2, CTNNA1, DICER1, EPCAM, GREM1, HOXB13, KIT, MEN1, MLH1, MSH2, MSH3, MSH6, MUTYH, NBN,   GERD (gastroesophageal reflux disease)    Headache    History of external beam radiation therapy    right breast 07-27-2017  to 09-25-2017   History of non-ST elevation myocardial infarction (NSTEMI)    HOCM (hypertrophic obstructive cardiomyopathy) (HCC) followed by cardiology   a. 12/2016 Echo: EF 65-70%,  mod conc LVH, dynamic obstruction @ rest, peak velocity of 291 cm/sec w/ peak gradient of , no rwma, Gr1 DD, triv TR, PASP .   Hyperlipidemia    Hypertension    Malignant neoplasm of lower-outer quadrant of right breast of female, estrogen receptor positive [facility]) oncologist--- dr odean   dx 06/ 2018--- right breast invasive lobular carcinoma, ductal carcinoma w/ LCIS---- 06-21-2017 s/p right breast lumpecotmy w/ sln disseciton;   completed radiation 09-25-2017;  on tamoxifen    Myocardial infarction [facility])    2017   S/P drug eluting coronary stent placement    09-14-2014---  PCI with DES x1 to midRCA;  12-31-2016  PCI with DES  x1 to proxRCA   Transverse myelitis [facility]) neurologist--- dr vear   01/ 2017  dx transverse myelitis w/ right side numbness (09-01-2019  currently lower extremity weakness, mucsle spasms, and gait disturbance)   Wears glasses    Past Surgical History:  Procedure Laterality Date   ABDOMINAL AORTOGRAM W/LOWER EXTREMITY N/A 08/11/2021   Procedure: ABDOMINAL AORTOGRAM W/LOWER EXTREMITY;  Surgeon: Court Dorn PARAS, MD;  Location: [facility] INVASIVE  CV LAB;  Service: Cardiovascular;  Laterality: N/A;   BREAST LUMPECTOMY WITH RADIOACTIVE SEED AND SENTINEL LYMPH NODE BIOPSY Right 06/21/2017   Procedure: RIGHT BREAST BRACKETED SEED GUIDED LUMPECTOMY AND SENTINEL LYMPH NODE BIOPSY;  Surgeon: Curvin Deward MOULD, MD;  Location: [facility] OR;  Service: General;  Laterality: Right;   CORONARY/GRAFT ACUTE MI REVASCULARIZATION N/A 12/31/2016   Procedure: Coronary/Graft Acute MI Revascularization;  Surgeon: Ozell Fell, MD;  Location: [facility] INVASIVE CV LAB;  Service: Cardiovascular;  Laterality: N/A;   DILATATION & CURETTAGE/HYSTEROSCOPY WITH MYOSURE N/A 09/02/2019   Procedure: DILATATION & CURETTAGE/HYSTEROSCOPY WITH MYOSURE;  Surgeon: Kandyce Sor, MD;  Location: [facility];  Service: Gynecology;  Laterality: N/A;   HERNIA REPAIR     LEFT HEART CATH AND CORONARY ANGIOGRAPHY N/A 12/31/2016   Procedure: Left Heart Cath and Coronary Angiography;  Surgeon: Ozell Fell, MD;  Location: [facility] INVASIVE CV LAB;  Service: Cardiovascular;  Laterality: N/A;   LEFT HEART CATH AND CORONARY ANGIOGRAPHY N/A 02/11/2024   Procedure: LEFT HEART CATH AND CORONARY ANGIOGRAPHY;  Surgeon: Elmira Newman PARAS, MD;  Location: [facility] INVASIVE CV LAB;  Service: Cardiovascular;  Laterality: N/A;   LEFT HEART CATHETERIZATION WITH CORONARY ANGIOGRAM N/A 09/14/2014   Procedure: LEFT HEART CATHETERIZATION WITH CORONARY ANGIOGRAM;  Surgeon: Lonni JONETTA Cash, MD;  Location: [facility] CATH LAB;  Service: Cardiovascular;  Laterality: N/A;   MASS EXCISION N/A 03/30/2020   Procedure: EXCISION MASS RIGHT LABIA MINORA;  Surgeon: Viktoria Comer SAUNDERS, MD;  Location: [facility] ORS;  Service: Gynecology;  Laterality: N/A;   PERCUTANEOUS CORONARY STENT INTERVENTION (PCI-S)  09/14/2014   Procedure: PERCUTANEOUS CORONARY STENT INTERVENTION (PCI-S);  Surgeon: Lonni JONETTA Cash, MD;  Location[facility] CATH LAB;  Service: Cardiovascular;;   PERIPHERAL VASCULAR INTERVENTION  08/11/2021   Procedure: PERIPHERAL  VASCULAR INTERVENTION;  Surgeon: Court Dorn PARAS, MD;  Location: [facility] INVASIVE CV LAB;  Service: Cardiovascular;;   ROBOTIC ASSISTED SALPINGO OOPHERECTOMY Bilateral 03/30/2020   Procedure: XI ROBOTIC ASSISTED SALPINGO OOPHORECTOMY;  Surgeon: Viktoria Comer SAUNDERS, MD;  Location: [facility] ORS;  Service: Gynecology;  Laterality: Bilateral;   TONSILLECTOMY  2005   UMBILICAL HERNIA REPAIR  2001; 2005   Social History:  reports that she quit smoking about 8 years ago. Her smoking use included cigarettes. She started smoking about 38 years ago. She has a 15 pack-year smoking history. She has never used smokeless tobacco. She reports that she does not currently use alcohol after a past usage of about 2.0 standard drinks of alcohol per week. She reports that she does not use drugs.  Allergies  Allergen Reactions   Pork-Derived Products Other (See Comments)    Does NOT eat pork    Family History  Problem Relation Age of Onset   Diabetes Mother    Heart disease Mother    Hyperlipidemia Mother    Hypertension Mother    Breast cancer Mother    Lung cancer Father    Drug abuse Father    Brain cancer Father 71   Bone cancer Father 72   Drug abuse Sister  Mental illness Brother    Mental illness Maternal Grandmother    Stomach cancer Paternal Grandmother 43       d.75    Prior to Admission medications   Medication Sig Start Date End Date Taking? Authorizing Provider  anastrozole  (ARIMIDEX ) 1 MG tablet TAKE 1 TABLET(1 MG) BY MOUTH DAILY 01/07/24  Yes Gudena, Vinay, MD  aspirin  81 MG chewable tablet Chew 1 tablet (81 mg total) by mouth daily. 09/16/14  Yes Samtani, Jai-Gurmukh, MD  atorvastatin  (LIPITOR ) 80 MG tablet Take 1 tablet (80 mg total) by mouth daily. 11/05/23  Yes Hilty, Vinie BROCKS, MD  baclofen  (LIORESAL ) 10 MG tablet TAKE 1 TABLET BY MOUTH FOUR TIMES DAILY 03/11/24  Yes Sater, Charlie LABOR, MD  carvedilol  (COREG ) 3.125 MG tablet Take 2 tablets (6.25 mg total) by mouth 2 (two) times daily with a  meal. 05/03/24  Yes Jens Durand, MD  clopidogrel  (PLAVIX ) 75 MG tablet Take 1 tablet (75 mg total) by mouth daily. 02/12/24  Yes Mdala-Gausi, Masiku Agatha, MD  Evolocumab  (REPATHA  SURECLICK) 140 MG/ML SOAJ ADMINISTER 1 ML UNDER THE SKIN EVERY 14 DAYS 05/05/24  Yes Hilty, Vinie BROCKS, MD  fluticasone  (FLONASE ) 50 MCG/ACT nasal spray Place 1-2 sprays into both nostrils daily as needed for allergies or rhinitis.   Yes [provider]  furosemide  (LASIX ) 20 MG tablet Take 1 tablet (20 mg total) by mouth daily. FOR FLUID RETENTION 02/14/24  Yes Hilty, Vinie BROCKS, MD  gabapentin  (NEURONTIN ) 300 MG capsule Take 3 capsules (900 MG) by mouth three times daily 12/12/23  Yes Sater, Charlie LABOR, MD  hydrALAZINE  (APRESOLINE ) 50 MG tablet Take 1 tablet (50 mg total) by mouth 3 (three) times daily. 02/13/24  Yes Mdala-Gausi, Masiku Agatha, MD  isosorbide  mononitrate (IMDUR ) 30 MG 24 hr tablet Take 1 tablet (30 mg total) by mouth daily. 02/14/24  Yes Mdala-Gausi, Masiku Agatha, MD  lisinopril  (ZESTRIL ) 20 MG tablet Take 2 tablets (40 mg total) by mouth daily. 02/12/24  Yes Mdala-Gausi, Masiku Agatha, MD  methocarbamol  (ROBAXIN ) 500 MG tablet TAKE 1 TABLET(500 MG) BY MOUTH THREE TIMES DAILY AS NEEDED FOR MUSCLE SPASMS 05/13/24  Yes Sater, Charlie LABOR, MD  nitroGLYCERIN  (NITROSTAT ) 0.4 MG SL tablet Place 1 tablet (0.4 mg total) under the tongue every 5 (five) minutes as needed for chest pain. PLACE 1 TABLET UNDER THE TONGUE EVERY 5 MINUTES AS NEEDED FOR CHEST PAIN 03/05/24  Yes Fountain, Madison L, NP  oxcarbazepine  (TRILEPTAL ) 600 MG tablet Take 1 tablet (600 mg total) by mouth 3 (three) times daily. 12/12/23  Yes Sater, Charlie LABOR, MD  spironolactone  (ALDACTONE ) 50 MG tablet TAKE 1 TABLET EACH MORNING AND 1/2 TABLET IN EVENING 05/12/24  Yes Mdala-Gausi, Golden Pillow, MD  traMADol  (ULTRAM ) 50 MG tablet TAKE 1 TABLET(50 MG) BY MOUTH UP TO THREE TIMES A DAILY AS NEEDED 12/12/23  Yes Sater, Charlie LABOR, MD  venlafaxine  XR (EFFEXOR -XR) 75 MG  24 hr capsule TAKE 1 CAPSULE BY MOUTH DAILY WITH BREAKFAST 02/05/24  Yes Odean Potts, MD  amLODipine  (NORVASC ) 10 MG tablet TAKE 1 TABLET BY MOUTH EVERY DAY Patient not taking: Reported on 05/19/2024 11/20/23   Mona Vinie BROCKS, MD    Physical Exam: Vitals:   05/19/24 1802 05/19/24 1900 05/19/24 1938 05/19/24 1947  BP: (!) 188/83 (!) 172/83 (!) 196/87 (!) 196/87  Pulse: 62 65 84 84  Resp: 16 16    Temp:      TempSrc:      SpO2: 100% 100% 98%  Weight:      Height:       Constitutional: Anxious, NAD, calm, comfortable Eyes: PERRL, lids and conjunctivae normal ENMT: Mucous membranes are moist. Posterior pharynx clear of any exudate or lesions.Normal dentition.  Neck: normal, supple, no masses, no thyromegaly Respiratory: clear to auscultation bilaterally, no wheezing, no crackles. Normal respiratory effort. No accessory muscle use.  Cardiovascular: Regular rate and rhythm, no murmurs / rubs / gallops. No extremity edema. 2+ pedal pulses. No carotid bruits.  Abdomen: no tenderness, no masses palpated. No hepatosplenomegaly. Bowel sounds positive.  Musculoskeletal: Good range of motion, no joint swelling or tenderness, Skin: no rashes, lesions, ulcers. No induration Neurologic: CN 2-12 grossly intact. Sensation intact, DTR normal. Strength 5/5 in all 4.  Psychiatric: Normal judgment and insight. Alert and oriented x 3.  Anxious mood  Data Reviewed:  Initial blood pressure was 248/131, pulse 84 respirate 22, oxygen sat is 97% on room air.  WBC showed platelets of 431.  Sodium 131 with CO2 of 19and creatinine of 0.8.  Chest x-ray showed no acute findings.  CT of the head without contrast showed no acute findings.  EKG showed normal sinus rhythm with no significant ST changes.  Assessment and Plan:  #1 malignant hypertensive urgency: Patient said to be compliant with her medications.  Findings are that of uncontrolled hypertension.  Will admit the patient.  Resume her home regimen which  includes amlodipine  10 mg, carvedilol  6.25 mg, Lasix , hydralazine , Imdur , lisinopril  and Aldactone .  Will continue to titrate medications and monitor blood pressure.  #2 coronary artery disease: Continue with aspirin , statin and beta-blockers.  #3 hyperlipidemia: Continue with statin  #4 history of breast cancer: In remission.  Continue Arimidex     Advance Care Planning:   Code Status: Full Code   Consults: None  Family Communication: No family at bedside  Severity of Illness: The appropriate patient status for this patient is OBSERVATION. Observation status is judged to be reasonable and necessary in order to provide the required intensity of service to ensure the patient's safety. The patient's presenting symptoms, physical exam findings, and initial radiographic and laboratory data in the context of their medical condition is felt to place them at decreased risk for further clinical deterioration. Furthermore, it is anticipated that the patient will be medically stable for discharge from the hospital within 2 midnights of admission.   AuthorBETHA SIM KNOLL, MD 05/19/2024 8:21 PM  For on call review [email or web address].

## 2024-05-20 DIAGNOSIS — I10 Essential (primary) hypertension: Secondary | ICD-10-CM | POA: Diagnosis not present

## 2024-05-20 DIAGNOSIS — E782 Mixed hyperlipidemia: Secondary | ICD-10-CM

## 2024-05-20 DIAGNOSIS — I2510 Atherosclerotic heart disease of native coronary artery without angina pectoris: Secondary | ICD-10-CM | POA: Diagnosis not present

## 2024-05-20 DIAGNOSIS — Z720 Tobacco use: Secondary | ICD-10-CM | POA: Diagnosis not present

## 2024-05-20 LAB — COMPREHENSIVE METABOLIC PANEL WITH GFR
ALT: 29 U/L (ref 0–44)
ALT: 34 U/L (ref 0–44)
AST: 28 U/L (ref 15–41)
AST: 30 U/L (ref 15–41)
Albumin: 3.9 g/dL (ref 3.5–5.0)
Albumin: 4.1 g/dL (ref 3.5–5.0)
Alkaline Phosphatase: 80 U/L (ref 38–126)
Alkaline Phosphatase: 82 U/L (ref 38–126)
Anion gap: 10 (ref 5–15)
Anion gap: 10 (ref 5–15)
BUN: 11 mg/dL (ref 6–20)
BUN: 13 mg/dL (ref 6–20)
CO2: 21 mmol/L — ABNORMAL LOW (ref 22–32)
CO2: 19 mmol/L — ABNORMAL LOW (ref 22–32)
Calcium: 9.9 mg/dL (ref 8.9–10.3)
Calcium: 9.7 mg/dL (ref 8.9–10.3)
Chloride: 101 mmol/L (ref 98–111)
Chloride: 99 mmol/L (ref 98–111)
Creatinine, Ser: 0.91 mg/dL (ref 0.44–1.00)
Creatinine, Ser: 0.95 mg/dL (ref 0.44–1.00)
GFR, Estimated: >60 mL/min (ref >60)
GFR, Estimated: >60 mL/min (ref >60)
Glucose, Bld: 97 mg/dL (ref 70–99)
Glucose, Bld: 86 mg/dL (ref 70–99)
Potassium: 4.1 mmol/L (ref 3.5–5.1)
Potassium: 4.4 mmol/L (ref 3.5–5.1)
Sodium: 132 mmol/L — ABNORMAL LOW (ref 135–145)
Sodium: 128 mmol/L — ABNORMAL LOW (ref 135–145)
Total Bilirubin: 0.5 mg/dL (ref 0.0–1.2)
Total Bilirubin: 0.3 mg/dL (ref 0.0–1.2)
Total Protein: 6.9 g/dL (ref 6.5–8.1)
Total Protein: 7.2 g/dL (ref 6.5–8.1)

## 2024-05-20 LAB — CBC WITH DIFFERENTIAL/PLATELET
Abs Immature Granulocytes: 0.03 K/uL (ref 0.00–0.07)
Basophils Absolute: 0.1 K/uL (ref 0.0–0.1)
Basophils Relative: 1 %
Eosinophils Absolute: 0.2 K/uL (ref 0.0–0.5)
Eosinophils Relative: 2 %
HCT: 41 % (ref 36.0–46.0)
Hemoglobin: 14.2 g/dL (ref 12.0–15.0)
Immature Granulocytes: 0 %
Lymphocytes Relative: 32 %
Lymphs Abs: 3.4 K/uL (ref 0.7–4.0)
MCH: 30.5 pg (ref 26.0–34.0)
MCHC: 34.6 g/dL (ref 30.0–36.0)
MCV: 88 fL (ref 80.0–100.0)
Monocytes Absolute: 0.8 K/uL (ref 0.1–1.0)
Monocytes Relative: 7 %
Neutro Abs: 6.1 K/uL (ref 1.7–7.7)
Neutrophils Relative %: 58 %
Platelets: 444 K/uL — ABNORMAL HIGH (ref 150–400)
RBC: 4.66 MIL/uL (ref 3.87–5.11)
RDW: 13.9 % (ref 11.5–15.5)
WBC: 10.5 K/uL (ref 4.0–10.5)
nRBC: 0 % (ref 0.0–0.2)

## 2024-05-20 LAB — CBC
HCT: 38.8 % (ref 36.0–46.0)
Hemoglobin: 13.6 g/dL (ref 12.0–15.0)
MCH: 30.9 pg (ref 26.0–34.0)
MCHC: 35.1 g/dL (ref 30.0–36.0)
MCV: 88.2 fL (ref 80.0–100.0)
Platelets: 413 K/uL — ABNORMAL HIGH (ref 150–400)
RBC: 4.4 MIL/uL (ref 3.87–5.11)
RDW: 13.6 % (ref 11.5–15.5)
WBC: 11.7 K/uL — ABNORMAL HIGH (ref 4.0–10.5)
nRBC: 0 % (ref 0.0–0.2)

## 2024-05-20 LAB — LACTIC ACID, PLASMA
Lactic Acid, Venous: 1 mmol/L (ref 0.5–1.9)
Lactic Acid, Venous: 0.9 mmol/L (ref 0.5–1.9)

## 2024-05-20 LAB — MAGNESIUM: Magnesium: 2.1 mg/dL (ref 1.7–2.4)

## 2024-05-20 LAB — TSH: TSH: 2.218 u[IU]/mL (ref 0.350–4.500)

## 2024-05-20 LAB — PROCALCITONIN: Procalcitonin: <0.1 ng/mL

## 2024-05-20 LAB — PHOSPHORUS: Phosphorus: 3.8 mg/dL (ref 2.5–4.6)

## 2024-05-20 MED ORDER — HYDRALAZINE HCL 50 MG PO TABS: 50.0000 mg | ORAL_TABLET | Freq: Three times a day (TID) | ORAL | Status: DC

## 2024-05-20 MED ORDER — SODIUM BICARBONATE 650 MG PO TABS: 650.0000 mg | ORAL_TABLET | Freq: Two times a day (BID) | ORAL | Status: DC

## 2024-05-20 MED ORDER — SODIUM BICARBONATE 650 MG PO TABS: 650.0000 mg | ORAL_TABLET | Freq: Two times a day (BID) | ORAL | 0 refills | Status: AC

## 2024-05-20 MED ORDER — AMLODIPINE BESYLATE 10 MG PO TABS: 10.0000 mg | ORAL_TABLET | Freq: Every day | ORAL | 0 refills | Status: DC

## 2024-05-20 NOTE — Care Management Obs Status (Signed)
 MEDICARE OBSERVATION STATUS NOTIFICATION   Patient Details  Name: Anna Henson MRN: [id number] Date of Birth: 05-Apr-1971   Medicare Observation Status Notification Given:  Yes  Document signed and copy given    Claretta Deed 05/20/2024, 2:15 PM

## 2024-05-20 NOTE — Plan of Care (Signed)

## 2024-05-20 NOTE — Discharge Summary (Signed)
 Physician Discharge Summary   Patient: Anna Henson MRN: [id number] DOB: Jul 11, 1971  Admit date:     05/19/2024  Discharge date: 05/20/24  Discharge Physician: Alejandro Marker, DO   PCP: Patient, No Pcp Per   Recommendations at discharge:   Follow-up and establish with PCP within 1 to 2 weeks repeat CBC, CMP, mag, Phos within 1 week Follow-up with cardiology in outpatient setting within 1 to 2 weeks and a referral has been made for you  Discharge Diagnoses: Principal Problem:   Malignant hypertension Active Problems:   Unstable angina (HCC)   HLD (hyperlipidemia)   CAD (coronary artery disease), native coronary artery   Coronary artery disease involving native coronary artery of native heart without angina pectoris   Tobacco abuse  Resolved Problems:   * No resolved hospital problems. *  Hospital Course: Patient is a 53 year old overweight African-American female with past medical history significant for Padonda to essential hypertension, bipolar disorder, CAD, anxiety, anemia of chronic disease, GERD, history of hokum, hyperlipidemia, CKD stage IIIa and other comorbidities who presented to the ED with uncontrolled blood pressures.  She was having some chest discomfort and rated a 6 out of 10 with some nausea and headache.  She took took some nitroglycerin  and still having some discomfort.  She is brought to the ED and was given her home blood pressure medication and blood pressure improved and was admitted for hypertensive urgency with malignant hypertension.  She was observed and blood pressure improved significantly after initiation of her antihypertensives and of note she was just recently hospitalized in July as well as May of this year for similar presentations and atypical chest pain and related to NSTEMI type II.  She completed workup was discharged and her blood pressure is now improved and symptoms are resolved.  She is medically stable for discharge only to follow-up with  PCP and cardiology outpatient setting.  Her troponins were not elevated and ACS was unlikely.  Symptoms were likely in the setting of malignant hypertensive urgency from her medications.  Medications are not been adjusted.  She will need outpatient follow-up with PCP and cardiology within 1 to 2 weeks  Assessment and Plan:  Malignant Hypertensive Urgency: Patient said to be compliant with her medications.  Findings are that of uncontrolled hypertension.  Placed in observation resume her home regimen which includes amlodipine  10 mg, carvedilol  6.25 mg, Lasix , hydralazine , Imdur , lisinopril  and Aldactone .  Titrated her medications and increased her hydralazine .  She is now improved and medically stable for discharge and symptoms resolved.  She will need follow-up with PCP and cardiology outpatient setting.  She was recently admitted for the same last month and has been stabilized and this time we do not feel that she has ACS given that her troponins were elevated.  Blood pressure at the time of discharge was 130/78   CAD: Continue with aspirin , statin and beta-blockers.   Hyperlipidemia: Continue with statin   History of breast cancer: In remission.  Continue Arimidex   Leukocytosis: Likely reactive and resolved.  Hyponatremia: Mild and sodium went from 132-120.  Not symptomatic and will need close monitoring and can be followed in outpatient setting within 1 to 2 weeks  CKD stage IIIa/metabolic acidosis: BUN/creatinine is better than baseline.  Has a slight metabolic acidosis with a CO2 of 19, chloride level of 99 and anion gap of 10 monitor in outpatient setting repeat CBC and CMP within 1 week  Consultants: None Procedures performed: As delineated as above Disposition: Home  Diet recommendation:  Discharge Diet Orders (From admission, onward)     Start     Ordered   05/20/24 0000  Diet - low sodium heart healthy        05/20/24 1308           Cardiac diet DISCHARGE  MEDICATION: Allergies as of 05/20/2024       Reactions   Pork-derived Products Other (See Comments)   Does NOT eat pork        Medication List     TAKE these medications    amLODipine  10 MG tablet Commonly known as: NORVASC  Take 1 tablet (10 mg total) by mouth daily.   anastrozole  1 MG tablet Commonly known as: ARIMIDEX  TAKE 1 TABLET(1 MG) BY MOUTH DAILY   aspirin  81 MG chewable tablet Chew 1 tablet (81 mg total) by mouth daily.   atorvastatin  80 MG tablet Commonly known as: LIPITOR  Take 1 tablet (80 mg total) by mouth daily.   baclofen  10 MG tablet Commonly known as: LIORESAL  TAKE 1 TABLET BY MOUTH FOUR TIMES DAILY   carvedilol  3.125 MG tablet Commonly known as: COREG  Take 2 tablets (6.25 mg total) by mouth 2 (two) times daily with a meal.   clopidogrel  75 MG tablet Commonly known as: PLAVIX  Take 1 tablet (75 mg total) by mouth daily.   fluticasone  50 MCG/ACT nasal spray Commonly known as: FLONASE  Place 1-2 sprays into both nostrils daily as needed for allergies or rhinitis.   furosemide  20 MG tablet Commonly known as: LASIX  Take 1 tablet (20 mg total) by mouth daily. FOR FLUID RETENTION   gabapentin  300 MG capsule Commonly known as: NEURONTIN  Take 3 capsules (900 MG) by mouth three times daily   hydrALAZINE  50 MG tablet Commonly known as: APRESOLINE  Take 1 tablet (50 mg total) by mouth 3 (three) times daily.   isosorbide  mononitrate 30 MG 24 hr tablet Commonly known as: IMDUR  Take 1 tablet (30 mg total) by mouth daily.   lisinopril  20 MG tablet Commonly known as: ZESTRIL  Take 2 tablets (40 mg total) by mouth daily.   methocarbamol  500 MG tablet Commonly known as: ROBAXIN  TAKE 1 TABLET(500 MG) BY MOUTH THREE TIMES DAILY AS NEEDED FOR MUSCLE SPASMS   nitroGLYCERIN  0.4 MG SL tablet Commonly known as: NITROSTAT  Place 1 tablet (0.4 mg total) under the tongue every 5 (five) minutes as needed for chest pain. PLACE 1 TABLET UNDER THE TONGUE EVERY 5  MINUTES AS NEEDED FOR CHEST PAIN   oxcarbazepine  600 MG tablet Commonly known as: TRILEPTAL  Take 1 tablet (600 mg total) by mouth 3 (three) times daily.   Repatha  SureClick 140 MG/ML Soaj Generic drug: Evolocumab  ADMINISTER 1 ML UNDER THE SKIN EVERY 14 DAYS   sodium bicarbonate  650 MG tablet Take 1 tablet (650 mg total) by mouth 2 (two) times daily for 5 days.   spironolactone  50 MG tablet Commonly known as: ALDACTONE  TAKE 1 TABLET EACH MORNING AND 1/2 TABLET IN EVENING   traMADol  50 MG tablet Commonly known as: ULTRAM  TAKE 1 TABLET(50 MG) BY MOUTH UP TO THREE TIMES A DAILY AS NEEDED   venlafaxine  XR 75 MG 24 hr capsule Commonly known as: EFFEXOR -XR TAKE 1 CAPSULE BY MOUTH DAILY WITH BREAKFAST        Discharge Exam: Filed Weights   05/19/24 1214  Weight: 65.8 kg   Vitals:   05/20/24 0700 05/20/24 1226  BP: 130/75 130/78  Pulse: 75 66  Resp:    Temp: 98 F (36.7 C)  SpO2: 100% 100%   Examination: Physical Exam:  Constitutional: WN/WD overweight African-American female in no acute distress Respiratory: Diminished to auscultation bilaterally, no wheezing, rales, rhonchi or crackles. Normal respiratory effort and patient is not tachypenic. No accessory muscle use.  Unlabored breathing Cardiovascular: RRR, no murmurs / rubs / gallops. S1 and S2 auscultated. No appreciable extremity edema Abdomen: Soft, non-tender, distended secondary to body habitus. Bowel sounds positive.  GU: Deferred. Musculoskeletal: No clubbing / cyanosis of digits/nails. No joint deformity upper and lower extremities.  Skin: No rashes, lesions, ulcers on limited skin evaluation. No induration; Warm and dry.  Neurologic: CN 2-12 grossly intact with no focal deficits. Romberg sign and cerebellar reflexes not assessed.  Psychiatric: Normal judgment and insight. Alert and oriented x 3. Normal mood and appropriate affect.   Condition at discharge: stable  The results of significant diagnostics  from this hospitalization (including imaging, microbiology, ancillary and laboratory) are listed below for reference.   Imaging Studies: CT Head Wo Contrast Result Date: 05/19/2024 EXAM: CT HEAD WITHOUT CONTRAST 05/19/2024 05:39:03 PM TECHNIQUE: CT of the head was performed without the administration of intravenous contrast. Automated exposure control, iterative reconstruction, and/or weight based adjustment of the mA/kV was utilized to reduce the radiation dose to as low as reasonably achievable. COMPARISON: CT head 02/10/24. CLINICAL HISTORY: Headache, increasing frequency or severity. FINDINGS: BRAIN AND VENTRICLES: No acute hemorrhage. Gray-white differentiation is preserved. No hydrocephalus. No extra-axial collection. No mass effect or midline shift. ORBITS: No acute abnormality. SINUSES: Focal mucosal thickening in the right sphenoid sinus which is slightly increased from prior. SOFT TISSUES AND SKULL: No acute soft tissue abnormality. No skull fracture. IMPRESSION: 1. No acute intracranial abnormality. 2. Focal mucosal thickening in the right sphenoid sinus, slightly increased from prior. Electronically signed by: Donnice Mania MD 05/19/2024 06:33 PM EDT RP Workstation: [id number]   DG Chest 2 View Result Date: 05/19/2024 EXAM: 2 VIEW(S) XRAY OF THE CHEST 05/19/2024 01:07:00 PM COMPARISON: 05/02/2024 CLINICAL HISTORY: Chest pain. Patient states having chest pain that began last night along with some nausea. She states having a little SOB but no cough. She also states that her blood pressure was high this morning when she woke up. FINDINGS: LUNGS AND PLEURA: No focal pulmonary opacity. No pulmonary edema. No pleural effusion. No pneumothorax. HEART AND MEDIASTINUM: No acute abnormality of the cardiac and mediastinal silhouettes. BONES AND SOFT TISSUES: No acute osseous abnormality. Surgical clips within the right axilla and right chest wall/breast. IMPRESSION: 1. No acute process. Electronically signed  by: Rockey Kilts MD 05/19/2024 02:05 PM EDT RP Workstation: [id number]   DG Chest Port 1 View Result Date: 05/02/2024 CLINICAL DATA:  Chest pain EXAM: PORTABLE CHEST 1 VIEW COMPARISON:  02/10/2024 FINDINGS: The heart size and mediastinal contours are within normal limits. Both lungs are clear. The visualized skeletal structures are unremarkable. IMPRESSION: No active disease. Electronically Signed   By: Oneil Devonshire M.D.   On: 05/02/2024 21:44    Microbiology: Results for orders placed or performed during the hospital encounter of 01/06/24  Hsv Culture And Typing     Status: None   Collection Time: 01/06/24 11:13 AM   Specimen: Vaginal; Other  Result Value Ref Range Status   HSV Culture/Type Comment  Final    Comment: (NOTE) Negative No Herpes simplex virus isolated. Performed At: [facility] [address] [place], [place] [id number] Jennette Shorter MD Ey:[id number]    Source of Sample VAGINAL  Final    Comment: Performed at [facility]  Hospital Lab, [address] [place], [place] [address]   Labs: CBC: Recent Labs  Lab 05/19/24 1223 05/19/24 2303 05/20/24 0246 05/20/24 0912  WBC 10.1 10.5 11.7* 10.5  NEUTROABS  --   --   --  6.1  HGB 13.5 13.8 13.6 14.2  HCT 38.2 39.1 38.8 41.0  MCV 87.6 87.5 88.2 88.0  PLT 432* 431* 413* 444*   Basic Metabolic Panel: Recent Labs  Lab 05/19/24 1223 05/19/24 2303 05/20/24 0246 05/20/24 0912  NA 131*  --  132* 128*  K 4.5  --  4.1 4.4  CL 100  --  101 99  CO2 19*  --  21* 19*  GLUCOSE 100*  --  97 86  BUN 11  --  11 13  CREATININE 0.80 0.85 0.91 0.95  CALCIUM  10.0  --  9.9 9.7  MG  --   --   --  2.1  PHOS  --   --   --  3.8   Liver Function Tests: Recent Labs  Lab 05/20/24 0246 05/20/24 0912  AST 28 30  ALT 29 34  ALKPHOS 80 82  BILITOT 0.5 0.3  PROT 6.9 7.2  ALBUMIN 3.9 4.1   CBG: No results for input(s): GLUCAP in the last 168 hours.  Discharge time spent: greater than 30 minutes.  Signed: Alejandro Marker,  DO [facility] 05/20/2024

## 2024-05-20 NOTE — Hospital Course (Addendum)
 Patient is a 53 year old overweight African-American female with past medical history significant for Padonda to essential hypertension, bipolar disorder, CAD, anxiety, anemia of chronic disease, GERD, history of hokum, hyperlipidemia, CKD stage IIIa and other comorbidities who presented to the ED with uncontrolled blood pressures.  She was having some chest discomfort and rated a 6 out of 10 with some nausea and headache.  She took took some nitroglycerin  and still having some discomfort.  She is brought to the ED and was given her home blood pressure medication and blood pressure improved and was admitted for hypertensive urgency with malignant hypertension.  She was observed and blood pressure improved significantly after initiation of her antihypertensives and of note she was just recently hospitalized in July as well as May of this year for similar presentations and atypical chest pain and related to NSTEMI type II.  She completed workup was discharged and her blood pressure is now improved and symptoms are resolved.  She is medically stable for discharge only to follow-up with PCP and cardiology outpatient setting.  Her troponins were not elevated and ACS was unlikely.  Symptoms were likely in the setting of malignant hypertensive urgency from her medications.  Medications are not been adjusted.  She will need outpatient follow-up with PCP and cardiology within 1 to 2 weeks  Assessment and Plan:  Malignant Hypertensive Urgency: Patient said to be compliant with her medications.  Findings are that of uncontrolled hypertension.  Placed in observation resume her home regimen which includes amlodipine  10 mg, carvedilol  6.25 mg, Lasix , hydralazine , Imdur , lisinopril  and Aldactone .  Titrated her medications and increased her hydralazine .  She is now improved and medically stable for discharge and symptoms resolved.  She will need follow-up with PCP and cardiology outpatient setting.  She was recently admitted  for the same last month and has been stabilized and this time we do not feel that she has ACS given that her troponins were elevated.  Blood pressure at the time of discharge was 130/78   CAD: Continue with aspirin , statin and beta-blockers.   Hyperlipidemia: Continue with statin   History of breast cancer: In remission.  Continue Arimidex   Leukocytosis: Likely reactive and resolved.  Hyponatremia: Mild and sodium went from 132-120.  Not symptomatic and will need close monitoring and can be followed in outpatient setting within 1 to 2 weeks  CKD stage IIIa/Metabolic Acidosis: BUN/creatinine is better than baseline.  Has a slight metabolic acidosis with a CO2 of 19, chloride level of 99 and anion gap of 10 monitor in outpatient setting repeat CBC and CMP within 1 week

## 2024-05-28 ENCOUNTER — Encounter: Payer: Self-pay | Admitting: Neurology

## 2024-06-05 ENCOUNTER — Ambulatory Visit: Admitting: Emergency Medicine

## 2024-06-05 NOTE — Progress Notes (Deleted)
 Cardiology Office Note:    Date:  06/05/2024  ID:  Anna Henson, DOB [birth year], MRN [id number] PCP: Patient, No Pcp Per   [facility] Providers Cardiologist:  Vinie JAYSON Maxcy, MD { Click to update primary MD,subspecialty MD or APP then REFRESH:1}    {Click to Open Review  :1}   Patient Profile:       Chief Complaint: *** History of Present Illness:  Anna Henson is a 53 y.o. female with visit-pertinent history of CAD s/p RCA PCI in 2015 and overlapping DES to previously placed RCA in 2018, hypertension, hyperlipidemia, peripheral arterial disease s/p peripheral angiography on 08/2021, breast cancer in remission, transverse myelitis with chronic spine pain since 2017   She has had a heart attack in the past and has had RCA stenting on 09/14/2014 and again on 12/31/2016.  She also has hypertension for many years and has had several admissions for hypertensive urgency.  She had renal Doppler studies performed on 10/15/2020 that suggested mild to moderate left renal artery stenosis and subsequent CTA performed 11/19/2020 that showed moderate left renal artery stenosis in the 50% range which was confirmatory.  Her CTA showed a focal high-grade distal left common iliac artery stenosis as well.  She underwent peripheral angiography on 08/11/2021 revealing high-grade distal left common iliac artery stenosis which was stented.  She had 90% focal mid left SFA which was not intervened on.  There was no obstructive disease on the right.  She did have 40% proximal left renal artery stenosis.  Her claudication was significantly improved since her procedure.   Patient was seen on 02/10/2024 in the hospital for evaluation of chest pain.  She presented to the ED for evaluation of vomiting, diarrhea, dizziness, and chest pain.  Per EMS patient was diaphoretic and hypertensive in the 200s.  Reported chest pain started while laying in bed after 6 hours, constant for 4 hours until given nitroglycerin  x  2 in the ED.  EKG showed T wave changes.  Elevated troponins at 243, 679.  She underwent cardiac catheterization revealing new 90% stenosis in the AV groove which was too small to intervene on.  Otherwise 40 to 50% ISR in RCA.  20% stenosis in LAD.  Started on DAPT with aspirin  and Plavix  for 1 year.  She is continued on carvedilol  6.25 mg twice daily, statin, Repatha .  She was also started on Imdur  30 mg daily.  Echocardiogram revealed LVEF 60 to 65%, mild SAM with moderate LVH, mildly turbulent LVOT with no gradient.  Her blood pressures were as high as 224/101.  She was started on hydralazine  50 mg 3 times daily and continued on amlodipine  10 mg, lisinopril  40 mg, carvedilol  6.25 mg twice daily, spironolactone  25 mg.  Patient was seen for follow-up on 03/05/2024.  Noted she was doing well at the time.  Also noted not compliant with medication regimen prior to recent admission.  She was without chest pains.  Her blood pressure was much improved.  She was to continue current medication regimen and follow-up in 3 months.  Patient admitted to the hospital 05/02/2024 for chest pains.  Troponin trended 12, 31, and 20.  ACS have been ruled out.  She was found to be in hypertensive urgency in the ED.  Blood pressure was 231/89 on admission.  Carvedilol  was increased to 6.25 mg twice daily.  She was discharged in stable condition.  Patient was recently admitted to the hospital on 05/19/2024 for hypertensive urgency and chest pains.  Her troponins were not elevated and ACS is unlikely.  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  She was discharged home in stable condition.  Discussed the use of AI scribe software for clinical note transcription with the patient, who gave verbal consent to proceed.  History of Present Illness     Review of systems:  Please see the history of present illness. All other systems are reviewed and otherwise negative. ***      Studies Reviewed:         ***  Risk Assessment/Calculations:   {Does this patient have ATRIAL FIBRILLATION?:[phone]} No BP recorded.  {Refresh Note OR Click here to enter BP  :1}***        Physical Exam:   VS:  LMP 03/06/2020    Wt Readings from Last 3 Encounters:  05/19/24 145 lb (65.8 kg)  05/03/24 131 lb 9.6 oz (59.7 kg)  03/05/24 137 lb (62.1 kg)    GEN: Well nourished, well developed in no acute distress NECK: No JVD; No carotid bruits CARDIAC: ***RRR, no murmurs, rubs, gallops RESPIRATORY:  Clear to auscultation without rales, wheezing or rhonchi  ABDOMEN: Soft, non-tender, non-distended EXTREMITIES:  No edema; No acute deformity ***      Assessment and Plan:    Assessment and Plan Assessment & Plan      {Are you ordering a CV Procedure (e.g. stress test, cath, DCCV, TEE, etc)?   Press F2        :[id number]}  Dispo:  No follow-ups on file.  Signed, Lum LITTIE Louis, NP

## 2024-06-07 ENCOUNTER — Other Ambulatory Visit: Payer: Self-pay | Admitting: Neurology

## 2024-06-10 NOTE — Telephone Encounter (Signed)
 Last seen on 12/12/23 Follow up scheduled on 07/10/24

## 2024-06-20 ENCOUNTER — Encounter (HOSPITAL_BASED_OUTPATIENT_CLINIC_OR_DEPARTMENT_OTHER): Payer: Self-pay | Admitting: *Deleted

## 2024-06-23 ENCOUNTER — Encounter: Payer: Self-pay | Admitting: *Deleted

## 2024-06-24 ENCOUNTER — Ambulatory Visit: Admitting: Emergency Medicine

## 2024-06-24 NOTE — Progress Notes (Deleted)
 Cardiology Office Note:    Date:  06/24/2024  ID:  Anna Henson, DOB [birth year], MRN [id number] PCP: Patient, No Pcp Per  [facility] Providers Cardiologist:  Vinie JAYSON Maxcy, MD { Click to update primary MD,subspecialty MD or APP then REFRESH:1}    {Click to Open Review  :1}   Patient Profile:       Chief Complaint: *** History of Present Illness:  Anna Henson is a 53 y.o. female with visit-pertinent history of CAD s/p RCA PCI in 2015 and overlapping DES to previously placed RCA in 2018, hypertension, hyperlipidemia, peripheral arterial disease s/p peripheral angiography on 08/2021, breast cancer in remission, transverse myelitis with chronic spine pain since 2017   She has had a heart attack in the past and has had RCA stenting on 09/14/2014 and again on 12/31/2016.  She also has hypertension for many years and has had several admissions for hypertensive urgency.  She had renal Doppler studies performed on 10/15/2020 that suggested mild to moderate left renal artery stenosis and subsequent CTA performed 11/19/2020 that showed moderate left renal artery stenosis in the 50% range which was confirmatory.  Her CTA showed a focal high-grade distal left common iliac artery stenosis as well.  She underwent peripheral angiography on 08/11/2021 revealing high-grade distal left common iliac artery stenosis which was stented.  She had 90% focal mid left SFA which was not intervened on.  There was no obstructive disease on the right.  She did have 40% proximal left renal artery stenosis.  Her claudication was significantly improved since her procedure.   Patient was seen on 02/10/2024 in the hospital for evaluation of chest pain.  She presented to the ED for evaluation of vomiting, diarrhea, dizziness, and chest pain.  Per EMS patient was diaphoretic and hypertensive in the 200s.  Reported chest pain started while laying in bed after 6 hours, constant for 4 hours until given nitroglycerin  x  2 in the ED.  EKG showed T wave changes.  Elevated troponins at 243, 679.  She underwent cardiac catheterization revealing new 90% stenosis in the AV groove which was too small to intervene on.  Otherwise 40 to 50% ISR in RCA.  20% stenosis in LAD.  Started on DAPT with aspirin  and Plavix  for 1 year.  She is continued on carvedilol  6.25 mg twice daily, statin, Repatha .  She was also started on Imdur  30 mg daily.  Echocardiogram revealed LVEF 60 to 65%, mild SAM with moderate LVH, mildly turbulent LVOT with no gradient.  Her blood pressures were as high as 224/101.  She was started on hydralazine  50 mg 3 times daily and continued on amlodipine  10 mg, lisinopril  40 mg, carvedilol  6.25 mg twice daily, spironolactone  25 mg.  Patient was seen for follow-up on 03/05/2024.  Noted she was doing well at the time.  Also noted not compliant with medication regimen prior to recent admission.  She was without chest pains.  Her blood pressure was much improved.  She was to continue current medication regimen and follow-up in 3 months.  Patient admitted to the hospital 05/02/2024 for chest pains.  Troponin trended 12, 31, and 20.  ACS have been ruled out.  She was found to be in hypertensive urgency in the ED.  Blood pressure was 231/89 on admission.  Carvedilol  was increased to 6.25 mg twice daily.  Blood pressure improved to 126/80 prior to discharge.  She was discharged in stable condition.  Patient was recently admitted to the hospital  on 05/19/2024 for hypertensive urgency and chest pains.  Her troponins were not elevated and ACS is unlikely.  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  Her hydralazine  was increased to 50 mg 3 times daily.  She was discharged home in stable condition.  Discussed the use of AI scribe software for clinical note transcription with the patient, who gave verbal consent to proceed.  History of Present Illness     Review of systems:  Please see the history of present  illness. All other systems are reviewed and otherwise negative. ***      Studies Reviewed:        ***  Risk Assessment/Calculations:   {Does this patient have ATRIAL FIBRILLATION?:[phone]} No BP recorded.  {Refresh Note OR Click here to enter BP  :1}***        Physical Exam:   VS:  LMP 03/06/2020    Wt Readings from Last 3 Encounters:  05/19/24 145 lb (65.8 kg)  05/03/24 131 lb 9.6 oz (59.7 kg)  03/05/24 137 lb (62.1 kg)    GEN: Well nourished, well developed in no acute distress NECK: No JVD; No carotid bruits CARDIAC: ***RRR, no murmurs, rubs, gallops RESPIRATORY:  Clear to auscultation without rales, wheezing or rhonchi  ABDOMEN: Soft, non-tender, non-distended EXTREMITIES:  No edema; No acute deformity ***      Assessment and Plan:    Assessment and Plan Assessment & Plan      {Are you ordering a CV Procedure (e.g. stress test, cath, DCCV, TEE, etc)?   Press F2        :[id number]}  Dispo:  No follow-ups on file.  Signed, Lum LITTIE Louis, NP

## 2024-06-26 ENCOUNTER — Encounter (HOSPITAL_BASED_OUTPATIENT_CLINIC_OR_DEPARTMENT_OTHER): Payer: Self-pay

## 2024-06-27 ENCOUNTER — Institutional Professional Consult (permissible substitution) (HOSPITAL_BASED_OUTPATIENT_CLINIC_OR_DEPARTMENT_OTHER): Admitting: Family

## 2024-07-10 ENCOUNTER — Encounter: Payer: Self-pay | Admitting: Neurology

## 2024-07-10 ENCOUNTER — Ambulatory Visit: Admitting: Neurology

## 2024-07-10 VITALS — BP 133/81 | HR 76 | Ht 62.0 in | Wt 136.5 lb

## 2024-07-10 DIAGNOSIS — Z79899 Other long term (current) drug therapy: Secondary | ICD-10-CM

## 2024-07-10 DIAGNOSIS — M4802 Spinal stenosis, cervical region: Secondary | ICD-10-CM

## 2024-07-10 DIAGNOSIS — G373 Acute transverse myelitis in demyelinating disease of central nervous system: Secondary | ICD-10-CM

## 2024-07-10 DIAGNOSIS — M5412 Radiculopathy, cervical region: Secondary | ICD-10-CM

## 2024-07-10 DIAGNOSIS — R208 Other disturbances of skin sensation: Secondary | ICD-10-CM | POA: Diagnosis not present

## 2024-07-10 DIAGNOSIS — R269 Unspecified abnormalities of gait and mobility: Secondary | ICD-10-CM

## 2024-07-10 MED ORDER — TRAMADOL HCL 50 MG PO TABS: ORAL_TABLET | ORAL | 5 refills | Status: AC

## 2024-07-10 MED ORDER — GABAPENTIN 300 MG PO CAPS: ORAL_CAPSULE | ORAL | 5 refills | Status: AC

## 2024-07-10 NOTE — Progress Notes (Signed)
 [facility]  PATIENT: Anna Henson DOB: 11-Apr-1971  REFERRING DOCTOR OR PCP:  Jaynie Daniels _________________________________   HISTORICAL  CHIEF COMPLAINT:  Chief Complaint  Patient presents with   RM11/TRANSVERSE MYELITIS    Pt is here Alone. Pt states that she has been having pain since her last appointment. Pt states that she been hospitalized a lot lately.     HISTORY OF PRESENT ILLNESS:  Anna Henson is a 53 yo woman with a transverse myelitis in late January 2017.    Follow-Up 07/10/2024 She continues to experience dysesthesias from the episode of transverse myelitis (2017).  They often awaken her at night.    She has electric like or burning sensations radiating down her spine and into the ribs when more severe. Right side is worse than left.    Sometimes she gets more radiating pain in the right arm.  Medications have only helped a little bit..  Pain is worse when the neck is touched.  Pain is sometimes better with a heating pad.   She notes numbness is worse in her left han though arm/hand pain is worse on the right.     Cervical spine MRI 12/2019 showed resolution of the cervical spine lesion seen previously (we discussed that despite no lesion noted on the most recent MRI that a scar is still left behind).    She takes gabapentin  900 mg po tid , oxcarbazapine 600-600-600 mg , Tramadol  50 mg po bid, baclofen  10 mg 3-4 times a day.  These medications help tingling pain some. She feels worse than last year.   Lamotrigine  had not helped.  The tramadol  makes her sleepy so she usually just takes 1 a day  She has reduced gait due to right foot drop as sequela as transverse myelitis in the past.  She uses the Rollator in  the homes if going more than a few feet.but needs it outdoors.  Generally she can go   100 feet (but usually with a rest) with her walker.   She also had right > left arm pain that shoots down from her neck.  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  Also has spinal stenosis at [facility].     She had an ESI (Dr. Stanton) but it had not helped and was very painful.  She saw Dr. Onetha 07/2022 who felt she could be a candidate for C5-C7 ACDF but that it was not absolutely necessary.   She prefers not to do surgery.     She has breast cancer that remains in remission.      IMAGING December 24, 2019 MRI of the cervical spine is normal (MRI February 2017 has shown T2 hyperintensity from the medulla to the mid cervical spine) she does.  She does have degenerative changes.  There is moderate spinal stenosis at C5-C6 with moderate right foraminal narrowing.  At C6-C7, she has left-sided disc osteophyte complex that could affect the left C7 nerve root.  December 24, 2019 MRI of the thoracic spine showed a normal spinal cord and no significant degenerative changes.  Transverse myelitis history: Transverse myelitis history:    In late January 2017,  she had a right sided headache and went to bed.  When she woke up, she noted numbness in the right neck, shoulder and arm.   Numbness worsened over the next fe days.   She went to her PCP an was told she might  have sinusitis and Amoxicillin  was prescribed.   After a couple more days, she saw another doctor and had a neck soft tissue CT scan.   That CT scan showed that she had an increase of adenoid tissue and reactive type lymph nodes.  By that time, numbness extended to the fingertips on the riht but not into her leg.   She was told to go to the emergency room for further evaluation. In the emergency room she had an MRI of the cervical spine and the brain. The MRI of the cervical spine showed a focus that extended from the lower medulla on the right down to C5 posteriorly and more medially within the spinal cord. She also had some enhancement around the level of the cervical medullary junction. She was admitted to the hospital for further treatment and testing.    She was treated  with 5 days of IV Solu-Medrol . During that time, her symptoms of numbness improved.  She has not returned to baseline.    She had many tests performed including lumbar puncture for CSF analysis and a thoracic spine MRI and serology blood tests. For the most part, the evaluation was negative.  The 02/02/16 cervical spine shows resolution of the prior enhancing focus that was seen at the cervicomedullary junction.    Data:     MRI  January 2017 has a longitudinal lesion extending from the lower medulla on the right down to C5/C6 on  The enhancement does not extend up to the medulla.  Cervical spine also showed mild spinal stenosis at C3-C4 and mild to moderate spinal stenosis at C5-C6 and mild spinal stenosis at C6-C7. There is some foraminal narrowing at those levels but no definite nerve root compression.  Elsewhere the brain, there were some subcortical and deep white matter T2/FLAIR hyperintense foci but not in a pattern that would be typical for MS. The thoracic spine was normal.   The 02/02/16 cervical spine MRI shows resolution of the prior enhancing focus that was seen at the cervicomedullary/upper cervical spine  .    December 24, 2019 MRI of the cervical spine is normal (MRI February 2017 has shown T2 hyperintensity from the medulla to the mid cervical spine) she does.  She does have degenerative changes.  There is moderate spinal stenosis at C5-C6 with moderate right foraminal narrowing.  At C6-C7, she has left-sided disc osteophyte complex that could affect the left C7 nerve root.  December 24, 2019 MRI of the thoracic spine showed a normal spinal cord and no significant degenerative changes.  2017 laboratory tests n. Neuromyelitis optica antibodies are negative. She did not have oligoclonal bands. ANCA, ANA, SSA/SSB, ACE and Vit B-12 were all normal or negative.  She had Epstein-Barr IgG but not IgM. CSF cell counts did not show increase in white blood cells.  REVIEW OF SYSTEMS: Constitutional: No  fevers, chills, sweats, or change in appetite.  She notes some fatigue Eyes: No visual changes, double vision, eye pain Ear, nose and throat: No hearing loss, ear pain, nasal congestion, sore throat Cardiovascular: No chest pain, palpitations.   H/O angina, CAD with stent Respiratory:  No shortness of breath at rest or with exertion.   No wheezes GastrointestinaI: No nausea, vomiting, diarrhea, abdominal pain, fecal incontinence Genitourinary:  Mild urgency and frequency.  No nocturia. Musculoskeletal:  No neck pain, back pain Integumentary: No rash, pruritus, skin lesions Neurological: as above Psychiatric: No depression at this time.  No anxiety Endocrine: No palpitations, diaphoresis, change in  appetite, change in weigh or increased thirst Hematologic/Lymphatic:  No anemia, purpura, petechiae. Allergic/Immunologic: No itchy/runny eyes, nasal congestion, recent allergic reactions, rashes  ALLERGIES: Allergies  Allergen Reactions   Pork-Derived Products Other (See Comments)    Does NOT eat pork     HOME MEDICATIONS:  Current Outpatient Medications:    amLODipine  (NORVASC ) 10 MG tablet, Take 1 tablet (10 mg total) by mouth daily., Disp: 30 tablet, Rfl: 0   anastrozole  (ARIMIDEX ) 1 MG tablet, TAKE 1 TABLET(1 MG) BY MOUTH DAILY, Disp: 90 tablet, Rfl: 3   aspirin  81 MG chewable tablet, Chew 1 tablet (81 mg total) by mouth daily., Disp: , Rfl:    atorvastatin  (LIPITOR ) 80 MG tablet, Take 1 tablet (80 mg total) by mouth daily., Disp: 90 tablet, Rfl: 3   baclofen  (LIORESAL ) 10 MG tablet, TAKE 1 TABLET BY MOUTH FOUR TIMES DAILY, Disp: 120 tablet, Rfl: 7   carvedilol  (COREG ) 3.125 MG tablet, Take 2 tablets (6.25 mg total) by mouth 2 (two) times daily with a meal., Disp: , Rfl:    clopidogrel  (PLAVIX ) 75 MG tablet, Take 1 tablet (75 mg total) by mouth daily., Disp: 30 tablet, Rfl: 3   Evolocumab  (REPATHA  SURECLICK) 140 MG/ML SOAJ, ADMINISTER 1 ML UNDER THE SKIN EVERY 14 DAYS, Disp: 2 mL, Rfl:  11   fluticasone  (FLONASE ) 50 MCG/ACT nasal spray, Place 1-2 sprays into both nostrils daily as needed for allergies or rhinitis., Disp: , Rfl:    furosemide  (LASIX ) 20 MG tablet, Take 1 tablet (20 mg total) by mouth daily. FOR FLUID RETENTION, Disp: 90 tablet, Rfl: 1   hydrALAZINE  (APRESOLINE ) 50 MG tablet, Take 1 tablet (50 mg total) by mouth 3 (three) times daily., Disp: 90 tablet, Rfl: 3   isosorbide  mononitrate (IMDUR ) 30 MG 24 hr tablet, Take 1 tablet (30 mg total) by mouth daily., Disp: 30 tablet, Rfl: 3   lisinopril  (ZESTRIL ) 20 MG tablet, Take 2 tablets (40 mg total) by mouth daily., Disp: , Rfl:    methocarbamol  (ROBAXIN ) 500 MG tablet, TAKE 1 TABLET(500 MG) BY MOUTH THREE TIMES DAILY AS NEEDED FOR MUSCLE SPASMS, Disp: 90 tablet, Rfl: 1   oxcarbazepine  (TRILEPTAL ) 600 MG tablet, TAKE 1 TABLET(600 MG) BY MOUTH THREE TIMES DAILY, Disp: 90 tablet, Rfl: 1   spironolactone  (ALDACTONE ) 50 MG tablet, TAKE 1 TABLET EACH MORNING AND 1/2 TABLET IN EVENING, Disp: 90 tablet, Rfl: 2   venlafaxine  XR (EFFEXOR -XR) 75 MG 24 hr capsule, TAKE 1 CAPSULE BY MOUTH DAILY WITH BREAKFAST, Disp: 90 capsule, Rfl: 1   gabapentin  (NEURONTIN ) 300 MG capsule, Take 3 capsules (900 MG) by mouth three times daily, Disp: 270 capsule, Rfl: 5   nitroGLYCERIN  (NITROSTAT ) 0.4 MG SL tablet, Place 1 tablet (0.4 mg total) under the tongue every 5 (five) minutes as needed for chest pain. PLACE 1 TABLET UNDER THE TONGUE EVERY 5 MINUTES AS NEEDED FOR CHEST PAIN (Patient not taking: Reported on 07/10/2024), Disp: 25 tablet, Rfl: 6   traMADol  (ULTRAM ) 50 MG tablet, TAKE 1 TABLET(50 MG) BY MOUTH UP TO THREE TIMES A DAILY AS NEEDED, Disp: 90 tablet, Rfl: 5  PAST MEDICAL HISTORY: Past Medical History:  Diagnosis Date   Anemia    Anxiety    Bipolar disorder in full remission    CAD in native artery cardiologist--  dr hilty   a. 09/2014 [place]  - s/p 2.25 x 12 mm Promus Premier DES to mid RCA;  b. 11/2016 Myoview : EF  56%, no ischemia;  c. 12/2016 NSTEMI/PCI: LM nl, LAd 25p, LCX 30p, RCA 100p (2.75x38 Promus Premier DES), mRCA 10 ISR, EF 50-55%.   CKD (chronic kidney disease), stage III (HCC)    Depression    Genetic testing 04/23/2017   Ms. Baldree underwent genetic counseling and testing for hereditary cancer syndromes on 04/05/2017. Her results were negative for mutations in all 46 genes analyzed by [facility]'s 46-gene Common Hereditary Cancers Panel. Genes analyzed include: APC, ATM, AXIN2, BARD1, BMPR1A, BRCA1, BRCA2, BRIP1, CDH1, CDKN2A, CHEK2, CTNNA1, DICER1, EPCAM, GREM1, HOXB13, KIT, MEN1, MLH1, MSH2, MSH3, MSH6, MUTYH, NBN,   GERD (gastroesophageal reflux disease)    Headache    History of external beam radiation therapy    right breast 07-27-2017  to 09-25-2017   History of non-ST elevation myocardial infarction (NSTEMI)    HOCM (hypertrophic obstructive cardiomyopathy) (HCC) followed by cardiology   a. 12/2016 Echo: EF 65-70%,  mod conc LVH, dynamic obstruction @ rest, peak velocity of 291 cm/sec w/ peak gradient of , no rwma, Gr1 DD, triv TR, PASP .   Hyperlipidemia    Hypertension    Malignant neoplasm of lower-outer quadrant of right breast of female, estrogen receptor positive [facility]) oncologist--- dr odean   dx 06/ 2018--- right breast invasive lobular carcinoma, ductal carcinoma w/ LCIS---- 06-21-2017 s/p right breast lumpecotmy w/ sln disseciton;   completed radiation 09-25-2017;  on tamoxifen    Myocardial infarction [facility])    2017   S/P drug eluting coronary stent placement    09-14-2014---  PCI with DES x1 to midRCA;  12-31-2016  PCI with DES x1 to proxRCA   Transverse myelitis [facility]) neurologist--- dr vear   01/ 2017  dx transverse myelitis w/ right side numbness (09-01-2019  currently lower extremity weakness, mucsle spasms, and gait disturbance)   Wears glasses     PAST SURGICAL HISTORY: Past Surgical History:  Procedure Laterality Date   ABDOMINAL AORTOGRAM W/LOWER  EXTREMITY N/A 08/11/2021   Procedure: ABDOMINAL AORTOGRAM W/LOWER EXTREMITY;  Surgeon: Court Dorn PARAS, MD;  Location: [facility] INVASIVE CV LAB;  Service: Cardiovascular;  Laterality: N/A;   BREAST LUMPECTOMY WITH RADIOACTIVE SEED AND SENTINEL LYMPH NODE BIOPSY Right 06/21/2017   Procedure: RIGHT BREAST BRACKETED SEED GUIDED LUMPECTOMY AND SENTINEL LYMPH NODE BIOPSY;  Surgeon: Curvin Deward MOULD, MD;  Location: [facility] OR;  Service: General;  Laterality: Right;   CORONARY/GRAFT ACUTE MI REVASCULARIZATION N/A 12/31/2016   Procedure: Coronary/Graft Acute MI Revascularization;  Surgeon: Ozell Fell, MD;  Location: [facility] INVASIVE CV LAB;  Service: Cardiovascular;  Laterality: N/A;   DILATATION & CURETTAGE/HYSTEROSCOPY WITH MYOSURE N/A 09/02/2019   Procedure: DILATATION & CURETTAGE/HYSTEROSCOPY WITH MYOSURE;  Surgeon: Kandyce Sor, MD;  Location: [facility];  Service: Gynecology;  Laterality: N/A;   HERNIA REPAIR     LEFT HEART CATH AND CORONARY ANGIOGRAPHY N/A 12/31/2016   Procedure: Left Heart Cath and Coronary Angiography;  Surgeon: Ozell Fell, MD;  Location: [facility] INVASIVE CV LAB;  Service: Cardiovascular;  Laterality: N/A;   LEFT HEART CATH AND CORONARY ANGIOGRAPHY N/A 02/11/2024   Procedure: LEFT HEART CATH AND CORONARY ANGIOGRAPHY;  Surgeon: Elmira Newman PARAS, MD;  Location: [facility] INVASIVE CV LAB;  Service: Cardiovascular;  Laterality: N/A;   LEFT HEART CATHETERIZATION WITH CORONARY ANGIOGRAM N/A 09/14/2014   Procedure: LEFT HEART CATHETERIZATION WITH CORONARY ANGIOGRAM;  Surgeon: Lonni JONETTA Cash, MD;  Location: [facility] CATH LAB;  Service: Cardiovascular;  Laterality: N/A;   MASS EXCISION N/A 03/30/2020   Procedure: EXCISION MASS RIGHT LABIA MINORA;  Surgeon: Viktoria Crank  R, MD;  Location: [facility] ORS;  Service: Gynecology;  Laterality: N/A;   PERCUTANEOUS CORONARY STENT INTERVENTION (PCI-S)  09/14/2014   Procedure: PERCUTANEOUS CORONARY STENT INTERVENTION (PCI-S);  Surgeon: Lonni JONETTA Cash, MD;  Location: [facility] CATH LAB;  Service: Cardiovascular;;   PERIPHERAL VASCULAR INTERVENTION  08/11/2021   Procedure: PERIPHERAL VASCULAR INTERVENTION;  Surgeon: Court Dorn PARAS, MD;  Location: [facility] INVASIVE CV LAB;  Service: Cardiovascular;;   ROBOTIC ASSISTED SALPINGO OOPHERECTOMY Bilateral 03/30/2020   Procedure: XI ROBOTIC ASSISTED SALPINGO OOPHORECTOMY;  Surgeon: Viktoria Comer SAUNDERS, MD;  Location: [facility] ORS;  Service: Gynecology;  Laterality: Bilateral;   TONSILLECTOMY  2005   UMBILICAL HERNIA REPAIR  2001; 2005    FAMILY HISTORY: Family History  Problem Relation Age of Onset   Diabetes Mother    Heart disease Mother    Hyperlipidemia Mother    Hypertension Mother    Breast cancer Mother    Lung cancer Father    Drug abuse Father    Brain cancer Father 28   Bone cancer Father 49   Drug abuse Sister    Mental illness Brother    Mental illness Maternal Grandmother    Stomach cancer Paternal Grandmother 55       d.75      DIAGNOSTIC DATA (LABS, IMAGING, TESTING) - I reviewed patient records, labs, notes, testing and imaging myself where available.  Lab Results  Component Value Date   WBC 10.5 05/20/2024   HGB 14.2 05/20/2024   HCT 41.0 05/20/2024   MCV 88.0 05/20/2024   PLT 444 (H) 05/20/2024   +__________________________________________________________   Today's Vitals   07/10/24 1609  BP: 133/81  Pulse: 76  SpO2: 98%  Weight: 136 lb 8 oz (61.9 kg)  Height: 5' 2 (1.575 m)   Body mass index is 24.97 kg/m.  General: The patient is well-developed and well-nourished and in no acute distress   Neurologic Exam   Mental status: The patient is alert and oriented x 3 at the time of the examination. The patient has apparent normal recent and remote memory, with an apparently normal attention span and concentration ability.   Speech is normal.   Cranial nerves: Extraocular movements are full.   Facial strength and sensation is normal. Trapezius strength is  normal    No obvious hearing deficits are noted.   Motor:  Muscle bulk is normal.  Tone is increased in legs, right greater than left. Strength is 5/5 except 4+/5 right triceps.   Sensory: Reduced right C6 and C7 sensation in hand. Vibration hyperesthetic in right leg vs left.   Gait and station: Station is normal.   The gait is spastic and wide.  She has a mild right foot drop.  She cannot tandem. She does better with Rollator  Romberg negative.     Reflexes: Deep tendon reflexes are symmetric in arms and increased in legs, right greater than left. There is spread at the knees.   One beat right ankle clonus .   ________________________________________________________   Transverse myelitis (HCC)  High risk medication use  Dysesthesia  Gait disturbance  Cervical spinal stenosis  Cervical radiculopathy   1. Continue oxcarbazepine  1800/day,  gabapentin  2700 mg/day, baclofen  and tramadol .    Can increase tramadol  to up to 3/day 2.  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   If new symptoms in different neurologic region, consider  MRI brain to assess for MS.  3.  Stay active and exercise as tolerated 4.  She will return to see us  in 6 or 7 months or sooner if new or worsening issues     Olivia Royse A. Vear, MD, PhD 07/10/2024, 8:42 PM Certified in Neurology, Clinical Neurophysiology, Sleep Medicine, Pain Medicine and Neuroimaging  [facility] [address] [place], [place] [address] [phone]

## 2024-07-24 ENCOUNTER — Other Ambulatory Visit (HOSPITAL_BASED_OUTPATIENT_CLINIC_OR_DEPARTMENT_OTHER): Payer: Self-pay

## 2024-07-24 ENCOUNTER — Encounter (HOSPITAL_BASED_OUTPATIENT_CLINIC_OR_DEPARTMENT_OTHER): Payer: Self-pay | Admitting: Family

## 2024-07-24 ENCOUNTER — Ambulatory Visit (INDEPENDENT_AMBULATORY_CARE_PROVIDER_SITE_OTHER): Admitting: Family

## 2024-07-24 VITALS — BP 181/100 | HR 69 | Ht 62.0 in | Wt 137.3 lb

## 2024-07-24 DIAGNOSIS — I701 Atherosclerosis of renal artery: Secondary | ICD-10-CM | POA: Diagnosis not present

## 2024-07-24 DIAGNOSIS — E785 Hyperlipidemia, unspecified: Secondary | ICD-10-CM | POA: Diagnosis not present

## 2024-07-24 DIAGNOSIS — I25118 Atherosclerotic heart disease of native coronary artery with other forms of angina pectoris: Secondary | ICD-10-CM

## 2024-07-24 DIAGNOSIS — I1A0 Resistant hypertension: Secondary | ICD-10-CM

## 2024-07-24 MED ORDER — AMLODIPINE BESYLATE 10 MG PO TABS: 10.0000 mg | ORAL_TABLET | Freq: Every evening | ORAL | 1 refills | Status: AC

## 2024-07-24 MED ORDER — CARVEDILOL 6.25 MG PO TABS
6.2500 mg | ORAL_TABLET | Freq: Two times a day (BID) | ORAL | 1 refills | Status: AC
Start: 2024-07-24 — End: ?

## 2024-07-24 MED ORDER — SPIRONOLACTONE 50 MG PO TABS: 50.0000 mg | ORAL_TABLET | Freq: Every day | ORAL | 2 refills | Status: AC

## 2024-07-24 MED ORDER — HYDRALAZINE HCL 50 MG PO TABS
ORAL_TABLET | ORAL | 1 refills | Status: DC
Start: 2024-07-24 — End: 2024-08-28

## 2024-07-24 MED ORDER — OLMESARTAN MEDOXOMIL 40 MG PO TABS: 40.0000 mg | ORAL_TABLET | Freq: Every day | ORAL | 1 refills | Status: AC

## 2024-07-24 MED ORDER — CLOPIDOGREL BISULFATE 75 MG PO TABS: 75.0000 mg | ORAL_TABLET | Freq: Every day | ORAL | 1 refills | Status: AC

## 2024-07-24 MED ORDER — ISOSORBIDE MONONITRATE ER 30 MG PO TB24: 30.0000 mg | ORAL_TABLET | Freq: Every day | ORAL | 3 refills | Status: DC

## 2024-07-24 MED ORDER — FUROSEMIDE 20 MG PO TABS: 20.0000 mg | ORAL_TABLET | Freq: Every day | ORAL | 1 refills | Status: AC

## 2024-07-24 NOTE — Patient Instructions (Addendum)
 Medication Instructions:  STOP Lisinopril   START Olmesartan 40mg  daily  CHANGE Carvedilol  to one 6.25mg  tablet twice per day  CHANGE Hydralazine  to 50mg  just TWICE per day  See medication schedule below  If you have a blood pressure of more than 180 for 2 readings that are 30 minutes apart, take an extra Hydralazine  50mg .   Labwork: Your physician recommends that you return for lab work in 7-10 days   Testing/Procedures: Your physician has requested that you have a renal artery duplex. During this test, an ultrasound is used to evaluate blood flow to the kidneys. Allow one hour for this exam. Do not eat after midnight the day before and avoid carbonated beverages. Take your medications as you usually do.    Follow-Up: Please follow up in 4-6 weeks in [facility] with Dr. Raford, Reche Finder, NP or Allean Mink PharmD    Special Instructions:    Morning Carvedilol  6.25mg   Spironolactone  50mg   Furosemide  20mg   Aspirin  81mg  Hydralazine  50mg   Midday Imdur  30mg  Atorvastatin  80mg   Evening Carvedilol  6.25mg   Amlodipine  10mg  Olmesartan 40mg  Plavix  75mg  (you will take this until 02/2025) Hydralazine  50mg 

## 2024-07-24 NOTE — Progress Notes (Signed)
 [facility] Initial Assessment:    Date:  07/24/2024   ID:  Anna Henson, DOB 1971/05/28, MRN [id number]  PCP:  Patient, No Pcp Per  Cardiologist:  Vinie JAYSON Maxcy, MD  Nephrologist:  Referring MD: Darryle Thom CROME, PA-C   CC: Hypertension  History of Present Illness:    Anna Henson is a 53 y.o. female with a hx of HTN, bipolar disorder, CAD, anxiety, anemia of chornic disease, GERD, HOCM, HLD, CKDIIIa, transverse myelitis here to establish care in the [facility].   Admitted 5/4-02/13/24 with NSTEMI due to hypertenisve crisis. LHC with obstructive lesion in LCx not amenable to revacularization. GDMT including DAPT initiated for medical therapy.  Admitted 7/25-7/26/25 with chest pain in setting of hypertensive urgency. Carvedilol  increased.   Admitted 8/11-8/12/25 with hypertensive urgency. Her Hydralazine  was increased.   Discussed the use of AI scribe software for clinical note transcription with the patient, who gave verbal consent to proceed.  History of Present Illness Anna Henson is a 53 year old female with hypertension who presents for management of uncontrolled blood pressure.  Her blood pressure fluctuates significantly, ranging from 130/80 to 200/110 mmHg, and is influenced by heat and stress. She experiences anxiety, which she attributes to labile BP. She uses a home upper arm blood pressure cuff which has been previously checked for accuracy. She has ordered a wrist cuff for convenience, discussed need to check if accurate.  Her current medications include carvedilol , lisinopril , amlodipine , and hydralazine . She takes carvedilol  twice daily, lisinopril  once daily, and hydralazine  typically twice daily. She manages her medications with a pill box and carries a medicine bag. Of note, adjusts medication frequently to prevent nausea with multiple medications at once.   There is a family history of heart conditions,  with her mother having heart failure and her grandmother having passed away from heart issues. She has made dietary changes, such as reducing red meat intake and using less salt, to manage her condition.  Her blood pressure is often high in the morning, and she sometimes wakes up with elevated readings. She has experienced chest pain in the past when her blood pressure was very high but does not report frequent chest pain currently.  Prior imaging 02/2024 normal adrenals.    Previous antihypertensives: Hydrochlorothiazide  - hypokalemia   Past Medical History:  Diagnosis Date   Anemia    Anxiety    Bipolar disorder in full remission    CAD in native artery cardiologist--  dr hilty   a. 09/2014 Cath/PCI in setting of [place]  - s/p 2.25 x 12 mm Promus Premier DES to mid RCA;  b. 11/2016 Myoview : EF 56%, no ischemia;  c. 12/2016 NSTEMI/PCI: LM nl, LAd 25p, LCX 30p, RCA 100p (2.75x38 Promus Premier DES), mRCA 10 ISR, EF 50-55%.   CKD (chronic kidney disease), stage III (HCC)    Depression    Genetic testing 04/23/2017   Ms. Bonzo underwent genetic counseling and testing for hereditary cancer syndromes on 04/05/2017. Her results were negative for mutations in all 46 genes analyzed by [facility]'s 46-gene Common Hereditary Cancers Panel. Genes analyzed include: APC, ATM, AXIN2, BARD1, BMPR1A, BRCA1, BRCA2, BRIP1, CDH1, CDKN2A, CHEK2, CTNNA1, DICER1, EPCAM, GREM1, HOXB13, KIT, MEN1, MLH1, MSH2, MSH3, MSH6, MUTYH, NBN,   GERD (gastroesophageal reflux disease)    Headache    History of external beam radiation therapy    right breast 07-27-2017  to 09-25-2017   History of non-ST elevation myocardial infarction (NSTEMI)  HOCM (hypertrophic obstructive cardiomyopathy) (HCC) followed by cardiology   a. 12/2016 Echo: EF 65-70%,  mod conc LVH, dynamic obstruction @ rest, peak velocity of 291 cm/sec w/ peak gradient of , no rwma, Gr1 DD, triv TR, PASP .   Hyperlipidemia    Hypertension    Malignant  neoplasm of lower-outer quadrant of right breast of female, estrogen receptor positive [facility]) oncologist--- dr odean   dx 06/ 2018--- right breast invasive lobular carcinoma, ductal carcinoma w/ LCIS---- 06-21-2017 s/p right breast lumpecotmy w/ sln disseciton;   completed radiation 09-25-2017;  on tamoxifen    Myocardial infarction [facility])    2017   S/P drug eluting coronary stent placement    09-14-2014---  PCI with DES x1 to midRCA;  12-31-2016  PCI with DES x1 to proxRCA   Transverse myelitis [facility]) neurologist--- dr vear   01/ 2017  dx transverse myelitis w/ right side numbness (09-01-2019  currently lower extremity weakness, mucsle spasms, and gait disturbance)   Wears glasses     Past Surgical History:  Procedure Laterality Date   ABDOMINAL AORTOGRAM W/LOWER EXTREMITY N/A 08/11/2021   Procedure: ABDOMINAL AORTOGRAM W/LOWER EXTREMITY;  Surgeon: Court Dorn PARAS, MD;  Location: [facility] INVASIVE CV LAB;  Service: Cardiovascular;  Laterality: N/A;   BREAST LUMPECTOMY WITH RADIOACTIVE SEED AND SENTINEL LYMPH NODE BIOPSY Right 06/21/2017   Procedure: RIGHT BREAST BRACKETED SEED GUIDED LUMPECTOMY AND SENTINEL LYMPH NODE BIOPSY;  Surgeon: Curvin Deward MOULD, MD;  Location: [facility] OR;  Service: General;  Laterality: Right;   CORONARY/GRAFT ACUTE MI REVASCULARIZATION N/A 12/31/2016   Procedure: Coronary/Graft Acute MI Revascularization;  Surgeon: Ozell Fell, MD;  Location: [facility] INVASIVE CV LAB;  Service: Cardiovascular;  Laterality: N/A;   DILATATION & CURETTAGE/HYSTEROSCOPY WITH MYOSURE N/A 09/02/2019   Procedure: DILATATION & CURETTAGE/HYSTEROSCOPY WITH MYOSURE;  Surgeon: Kandyce Sor, MD;  Location: [facility];  Service: Gynecology;  Laterality: N/A;   HERNIA REPAIR     LEFT HEART CATH AND CORONARY ANGIOGRAPHY N/A 12/31/2016   Procedure: Left Heart Cath and Coronary Angiography;  Surgeon: Ozell Fell, MD;  Location: [facility] INVASIVE CV LAB;  Service: Cardiovascular;  Laterality: N/A;   LEFT  HEART CATH AND CORONARY ANGIOGRAPHY N/A 02/11/2024   Procedure: LEFT HEART CATH AND CORONARY ANGIOGRAPHY;  Surgeon: Elmira Newman PARAS, MD;  Location: [facility] INVASIVE CV LAB;  Service: Cardiovascular;  Laterality: N/A;   LEFT HEART CATHETERIZATION WITH CORONARY ANGIOGRAM N/A 09/14/2014   Procedure: LEFT HEART CATHETERIZATION WITH CORONARY ANGIOGRAM;  Surgeon: Lonni JONETTA Cash, MD;  Location: [facility] CATH LAB;  Service: Cardiovascular;  Laterality: N/A;   MASS EXCISION N/A 03/30/2020   Procedure: EXCISION MASS RIGHT LABIA MINORA;  Surgeon: Viktoria Comer SAUNDERS, MD;  Location: [facility] ORS;  Service: Gynecology;  Laterality: N/A;   PERCUTANEOUS CORONARY STENT INTERVENTION (PCI-S)  09/14/2014   Procedure: PERCUTANEOUS CORONARY STENT INTERVENTION (PCI-S);  Surgeon: Lonni JONETTA Cash, MD;  Location: [facility] CATH LAB;  Service: Cardiovascular;;   PERIPHERAL VASCULAR INTERVENTION  08/11/2021   Procedure: PERIPHERAL VASCULAR INTERVENTION;  Surgeon: Court Dorn PARAS, MD;  Location: [facility] INVASIVE CV LAB;  Service: Cardiovascular;;   ROBOTIC ASSISTED SALPINGO OOPHERECTOMY Bilateral 03/30/2020   Procedure: XI ROBOTIC ASSISTED SALPINGO OOPHORECTOMY;  Surgeon: Viktoria Comer SAUNDERS, MD;  Location: [facility] ORS;  Service: Gynecology;  Laterality: Bilateral;   TONSILLECTOMY  2005   UMBILICAL HERNIA REPAIR  2001; 2005    Current Medications: Current Meds  Medication Sig   amLODipine  (NORVASC ) 10 MG tablet Take 1 tablet (10 mg total) by mouth  daily.   anastrozole  (ARIMIDEX ) 1 MG tablet TAKE 1 TABLET(1 MG) BY MOUTH DAILY   aspirin  81 MG chewable tablet Chew 1 tablet (81 mg total) by mouth daily.   atorvastatin  (LIPITOR ) 80 MG tablet Take 1 tablet (80 mg total) by mouth daily.   baclofen  (LIORESAL ) 10 MG tablet TAKE 1 TABLET BY MOUTH FOUR TIMES DAILY   carvedilol  (COREG ) 3.125 MG tablet Take 2 tablets (6.25 mg total) by mouth 2 (two) times daily with a meal.   clopidogrel  (PLAVIX ) 75 MG tablet Take 1 tablet (75 mg total) by mouth  daily.   Evolocumab  (REPATHA  SURECLICK) 140 MG/ML SOAJ ADMINISTER 1 ML UNDER THE SKIN EVERY 14 DAYS   fluticasone  (FLONASE ) 50 MCG/ACT nasal spray Place 1-2 sprays into both nostrils daily as needed for allergies or rhinitis.   furosemide  (LASIX ) 20 MG tablet Take 1 tablet (20 mg total) by mouth daily. FOR FLUID RETENTION   gabapentin  (NEURONTIN ) 300 MG capsule Take 3 capsules (900 MG) by mouth three times daily   hydrALAZINE  (APRESOLINE ) 50 MG tablet Take 1 tablet (50 mg total) by mouth 3 (three) times daily.   isosorbide  mononitrate (IMDUR ) 30 MG 24 hr tablet Take 1 tablet (30 mg total) by mouth daily.   lisinopril  (ZESTRIL ) 20 MG tablet Take 2 tablets (40 mg total) by mouth daily.   methocarbamol  (ROBAXIN ) 500 MG tablet TAKE 1 TABLET(500 MG) BY MOUTH THREE TIMES DAILY AS NEEDED FOR MUSCLE SPASMS   nitroGLYCERIN  (NITROSTAT ) 0.4 MG SL tablet Place 1 tablet (0.4 mg total) under the tongue every 5 (five) minutes as needed for chest pain. PLACE 1 TABLET UNDER THE TONGUE EVERY 5 MINUTES AS NEEDED FOR CHEST PAIN   oxcarbazepine  (TRILEPTAL ) 600 MG tablet TAKE 1 TABLET(600 MG) BY MOUTH THREE TIMES DAILY   spironolactone  (ALDACTONE ) 50 MG tablet TAKE 1 TABLET EACH MORNING AND 1/2 TABLET IN EVENING   traMADol  (ULTRAM ) 50 MG tablet TAKE 1 TABLET(50 MG) BY MOUTH UP TO THREE TIMES A DAILY AS NEEDED   venlafaxine  XR (EFFEXOR -XR) 75 MG 24 hr capsule TAKE 1 CAPSULE BY MOUTH DAILY WITH BREAKFAST     Allergies:   Porcine (pork) protein-containing drug products   Social History   Socioeconomic History   Marital status: Single    Spouse name: Not on file   Number of children: 0   Years of education: masters   Highest education level: Not on file  Occupational History   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   Drug use: No   Sexual activity: Not Currently  Other Topics Concern   Not on file  Social History Narrative   Consumes 2 cups of caffeine daily   Social Drivers of Health   Financial Resource Strain: Medium Risk (08/16/2017)   Overall Financial Resource Strain (CARDIA)    Difficulty of Paying Living Expenses: Somewhat hard  Food Insecurity: No Food  Insecurity (05/19/2024)   Hunger Vital Sign    Worried About Running Out of Food in the Last Year: Never true    Ran Out of Food in the Last Year: Never true  Transportation Needs: No Transportation Needs (05/19/2024)   PRAPARE - Administrator, Civil Service (Medical): No    Lack of Transportation (Non-Medical): No  Physical Activity: Insufficiently Active (08/16/2017)   Exercise Vital Sign    Days of Exercise per Week: 1 day    Minutes of Exercise per Session: 10 min  Stress: Stress Concern Present (08/16/2017)   [employer] of Occupational Health - Occupational Stress Questionnaire    Feeling of Stress : Very much  Social Connections: Unknown (02/20/2022)   Received from [facility]   Social Network    Social Network: Not on file     Family History: The patient's family history includes Bone cancer (age of onset: 45) in her father; Brain cancer (age of onset: 19) in her father; Breast cancer in her mother; Diabetes in her mother; Drug abuse in her father and sister; Heart disease in her mother; Hyperlipidemia in her mother; Hypertension in her mother; Lung cancer in her father; Mental illness in her brother and maternal grandmother; Stomach cancer (age of onset: 14) in her paternal grandmother.  ROS:   Please see the  history of present illness.     All other systems reviewed and are negative.  EKGs/Labs/Other Studies Reviewed:         Recent Labs: 02/10/2024: B Natriuretic Peptide 180.8 05/19/2024: TSH 2.218 05/20/2024: ALT 34; BUN 13; Creatinine, Ser 0.95; Hemoglobin 14.2; Magnesium  2.1; Platelets 444; Potassium 4.4; Sodium 128   Recent Lipid Panel    Component Value Date/Time   CHOL 285 (H) 10/29/2023 0837   TRIG 122 10/29/2023 0837   HDL 43 10/29/2023 0837   CHOLHDL 6.6 (H) 10/29/2023 0837   CHOLHDL 7.8 10/09/2020 0350   VLDL 36 10/09/2020 0350   LDLCALC 220 (H) 10/29/2023 0837    Physical Exam:   VS:  BP (!) 181/100 (BP Location: Left Arm)   Pulse 69   Ht 5' 2 (1.575 m)   Wt 137 lb 4.8 oz (62.3 kg)   LMP 03/06/2020   SpO2 98%   BMI 25.11 kg/m  , BMI Body mass index is 25.11 kg/m. GENERAL:  Well appearing HEENT: Pupils equal round and reactive, fundi not visualized, oral mucosa unremarkable NECK:  No jugular venous distention, waveform within normal limits, carotid upstroke brisk and symmetric, no bruits, no thyromegaly LYMPHATICS:  No cervical adenopathy LUNGS:  Clear to auscultation bilaterally HEART:  RRR.  PMI not displaced or sustained,S1 and S2 within normal limits, no S3, no S4, no clicks, no rubs, no murmurs ABD:  Flat, positive bowel sounds normal in frequency in pitch, no bruits, no rebound, no guarding, no midline pulsatile mass, no hepatomegaly, no splenomegaly EXT:  2 plus pulses throughout, no edema, no cyanosis no clubbing SKIN:  No rashes no nodules NEURO:  Cranial nerves II through XII grossly intact, motor grossly intact throughout PSYCH:  Cognitively intact, oriented to person place and time   ASSESSMENT/PLAN:    Assessment & Plan Resistant hypertension Long-standing hypertension with fluctuating readings due to timing of medications and side effects. - Switch lisinopril  to olmesartan 40mg  daily. -unfortunately insurance does not cover Olmesartan-amlodipine .   -Continue Amlodipine  10mg  daily, Furosemide  20mg  daily, Spironolactone  50mg  daily.  -Adjust Carvedilol  to 6.25mg  daily (was taking two 3.125mg  tablets, stronger tablet sent  to pharmacy) -Adjust Hydralazine  to 50mg  TID (difficulty with TID dosing).  - Instruct to monitor blood pressure at home. - Advise extra hydralazine  if BP >180 and remains high after 30 minutes. - Order renin aldosterone test (of note, if abnormal will need repeat testing off Spironolactone ) - Schedule kidney artery ultrasound.  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 - Continue current pain management regimen.  Gastroesophageal reflux disease GERD symptoms possibly exacerbated by polypharmacy. - Advise taking medications with food.  Hyperlipidemia Managed with atorvastatin . - Continue atorvastatin  80mg  daily    Screening for Secondary Hypertension:     Relevant Labs/Studies:    Latest Ref Rng & Units 05/20/2024    9:12 AM 05/20/2024    2:46 AM 05/19/2024   11:03 PM  Basic Labs  Sodium 135 - 145 mmol/L 128  132    Potassium 3.5 - 5.1 mmol/L 4.4  4.1    Creatinine 0.44 - 1.00 mg/dL 9.04  9.08  9.14        Latest Ref Rng & Units 05/19/2024   11:03 PM 10/15/2020    4:19 AM  Thyroid    TSH 0.350 - 4.500 uIU/mL 2.218  0.573        Latest Ref Rng & Units 10/14/2020    6:12 PM  Renin/Aldosterone   Aldosterone 0.0 - 30.0 ng/dL 7.6              0/72/7975    9:54 AM  Renovascular   Renal Artery US  Completed Yes      Disposition:    FU with MD/APP/PharmD in 4-6 weeks    Medication Adjustments/Labs and Tests Ordered: Current medicines are reviewed at length with the patient today.  Concerns regarding medicines are outlined above.  No orders of the defined types were placed in this encounter.  No orders of the defined types were placed in this encounter.    Signed, Reche GORMAN Finder, NP  07/24/2024 10:52 AM     [facility]

## 2024-08-01 ENCOUNTER — Observation Stay (HOSPITAL_COMMUNITY): Admission: EM | Admit: 2024-08-01 | Discharge: 2024-08-01 | Disposition: A | Discharge status: Home / Self Care

## 2024-08-01 ENCOUNTER — Other Ambulatory Visit: Payer: Self-pay

## 2024-08-01 ENCOUNTER — Emergency Department (HOSPITAL_COMMUNITY)

## 2024-08-01 ENCOUNTER — Encounter (HOSPITAL_COMMUNITY): Payer: Self-pay

## 2024-08-01 DIAGNOSIS — F32A Depression, unspecified: Secondary | ICD-10-CM | POA: Diagnosis not present

## 2024-08-01 DIAGNOSIS — I1A0 Resistant hypertension: Secondary | ICD-10-CM | POA: Insufficient documentation

## 2024-08-01 DIAGNOSIS — I129 Hypertensive chronic kidney disease with stage 1 through stage 4 chronic kidney disease, or unspecified chronic kidney disease: Secondary | ICD-10-CM | POA: Diagnosis not present

## 2024-08-01 DIAGNOSIS — E876 Hypokalemia: Secondary | ICD-10-CM | POA: Diagnosis not present

## 2024-08-01 DIAGNOSIS — Z853 Personal history of malignant neoplasm of breast: Secondary | ICD-10-CM | POA: Diagnosis not present

## 2024-08-01 DIAGNOSIS — E785 Hyperlipidemia, unspecified: Secondary | ICD-10-CM | POA: Diagnosis not present

## 2024-08-01 DIAGNOSIS — Z8679 Personal history of other diseases of the circulatory system: Secondary | ICD-10-CM

## 2024-08-01 DIAGNOSIS — Z7982 Long term (current) use of aspirin: Secondary | ICD-10-CM | POA: Diagnosis not present

## 2024-08-01 DIAGNOSIS — Z79899 Other long term (current) drug therapy: Secondary | ICD-10-CM | POA: Insufficient documentation

## 2024-08-01 DIAGNOSIS — I252 Old myocardial infarction: Secondary | ICD-10-CM | POA: Insufficient documentation

## 2024-08-01 DIAGNOSIS — K219 Gastro-esophageal reflux disease without esophagitis: Secondary | ICD-10-CM | POA: Diagnosis not present

## 2024-08-01 DIAGNOSIS — I25110 Atherosclerotic heart disease of native coronary artery with unstable angina pectoris: Secondary | ICD-10-CM | POA: Diagnosis not present

## 2024-08-01 DIAGNOSIS — N183 Chronic kidney disease, stage 3 unspecified: Secondary | ICD-10-CM | POA: Insufficient documentation

## 2024-08-01 DIAGNOSIS — E8722 Chronic metabolic acidosis: Secondary | ICD-10-CM | POA: Insufficient documentation

## 2024-08-01 DIAGNOSIS — G629 Polyneuropathy, unspecified: Secondary | ICD-10-CM | POA: Diagnosis not present

## 2024-08-01 DIAGNOSIS — I160 Hypertensive urgency: Secondary | ICD-10-CM | POA: Diagnosis present

## 2024-08-01 DIAGNOSIS — Z955 Presence of coronary angioplasty implant and graft: Secondary | ICD-10-CM | POA: Insufficient documentation

## 2024-08-01 DIAGNOSIS — G894 Chronic pain syndrome: Secondary | ICD-10-CM | POA: Diagnosis not present

## 2024-08-01 DIAGNOSIS — F1129 Opioid dependence with unspecified opioid-induced disorder: Secondary | ICD-10-CM | POA: Diagnosis not present

## 2024-08-01 DIAGNOSIS — R079 Chest pain, unspecified: Secondary | ICD-10-CM | POA: Diagnosis present

## 2024-08-01 DIAGNOSIS — D72829 Elevated white blood cell count, unspecified: Secondary | ICD-10-CM | POA: Diagnosis not present

## 2024-08-01 DIAGNOSIS — I739 Peripheral vascular disease, unspecified: Secondary | ICD-10-CM | POA: Diagnosis not present

## 2024-08-01 DIAGNOSIS — I200 Unstable angina: Secondary | ICD-10-CM | POA: Diagnosis present

## 2024-08-01 DIAGNOSIS — Z7902 Long term (current) use of antithrombotics/antiplatelets: Secondary | ICD-10-CM

## 2024-08-01 LAB — CBC
HCT: 39.6 % (ref 36.0–46.0)
Hemoglobin: 14.1 g/dL (ref 12.0–15.0)
MCH: 31.3 pg (ref 26.0–34.0)
MCHC: 35.6 g/dL (ref 30.0–36.0)
MCV: 87.8 fL (ref 80.0–100.0)
Platelets: 422 K/uL — ABNORMAL HIGH (ref 150–400)
RBC: 4.51 MIL/uL (ref 3.87–5.11)
RDW: 14.3 % (ref 11.5–15.5)
WBC: 14.4 K/uL — ABNORMAL HIGH (ref 4.0–10.5)
nRBC: 0 % (ref 0.0–0.2)

## 2024-08-01 LAB — BASIC METABOLIC PANEL WITH GFR
Anion gap: 13 (ref 5–15)
BUN: 9 mg/dL (ref 6–20)
CO2: 20 mmol/L — ABNORMAL LOW (ref 22–32)
Calcium: 9.2 mg/dL (ref 8.9–10.3)
Chloride: 97 mmol/L — ABNORMAL LOW (ref 98–111)
Creatinine, Ser: 0.9 mg/dL (ref 0.44–1.00)
GFR, Estimated: >60 mL/min (ref >60)
Glucose, Bld: 201 mg/dL — ABNORMAL HIGH (ref 70–99)
Potassium: 3.3 mmol/L — ABNORMAL LOW (ref 3.5–5.1)
Sodium: 130 mmol/L — ABNORMAL LOW (ref 135–145)

## 2024-08-01 LAB — LIPID PANEL
Cholesterol: 216 mg/dL — ABNORMAL HIGH (ref 0–200)
HDL: 52 mg/dL (ref >40)
LDL Cholesterol: 136 mg/dL — ABNORMAL HIGH (ref 0–99)
Total CHOL/HDL Ratio: 4.2 ratio
Triglycerides: 142 mg/dL (ref <150)
VLDL: 28 mg/dL (ref 0–40)

## 2024-08-01 LAB — TROPONIN I (HIGH SENSITIVITY)
Troponin I (High Sensitivity): 13 ng/L (ref <18)
Troponin I (High Sensitivity): 19 ng/L — ABNORMAL HIGH (ref <18)
Troponin I (High Sensitivity): 16 ng/L (ref <18)

## 2024-08-01 LAB — PROTIME-INR
INR: 0.8 (ref 0.8–1.2)
Prothrombin Time: 12 s (ref 11.4–15.2)

## 2024-08-01 MED ORDER — HYDRALAZINE HCL 50 MG PO TABS: 50.0000 mg | ORAL_TABLET | Freq: Three times a day (TID) | ORAL | Status: DC

## 2024-08-01 MED ORDER — SPIRONOLACTONE 25 MG PO TABS: 50.0000 mg | ORAL_TABLET | Freq: Every day | ORAL | Status: DC

## 2024-08-01 MED ORDER — SENNOSIDES-DOCUSATE SODIUM 8.6-50 MG PO TABS
1.0000 | ORAL_TABLET | Freq: Every evening | ORAL | Status: DC | PRN
Start: 2024-08-01 — End: 2024-08-01

## 2024-08-01 MED ORDER — TRAMADOL HCL 50 MG PO TABS
50.0000 mg | ORAL_TABLET | Freq: Three times a day (TID) | ORAL | Status: DC | PRN
Start: 2024-08-01 — End: 2024-08-01

## 2024-08-01 MED ORDER — GABAPENTIN 300 MG PO CAPS
900.0000 mg | ORAL_CAPSULE | Freq: Three times a day (TID) | ORAL | Status: DC
  Administered 2024-08-01: 900 mg via ORAL
  Filled 2024-08-01: qty 3

## 2024-08-01 MED ORDER — VENLAFAXINE HCL ER 75 MG PO CP24: 75.0000 mg | ORAL_CAPSULE | Freq: Every day | ORAL | Status: DC

## 2024-08-01 MED ORDER — ACETAMINOPHEN 650 MG RE SUPP: 650.0000 mg | Freq: Four times a day (QID) | RECTAL | Status: DC | PRN

## 2024-08-01 MED ORDER — ATORVASTATIN CALCIUM 80 MG PO TABS
80.0000 mg | ORAL_TABLET | Freq: Every day | ORAL | Status: DC
  Administered 2024-08-01: 80 mg via ORAL
  Filled 2024-08-01: qty 2

## 2024-08-01 MED ORDER — NITROGLYCERIN 0.4 MG SL SUBL: 0.4000 mg | SUBLINGUAL_TABLET | SUBLINGUAL | Status: DC | PRN

## 2024-08-01 MED ORDER — SUCRALFATE 1 G PO TABS
1.0000 g | ORAL_TABLET | Freq: Three times a day (TID) | ORAL | Status: DC
  Administered 2024-08-01: 1 g via ORAL
  Filled 2024-08-01: qty 1

## 2024-08-01 MED ORDER — AMLODIPINE BESYLATE 5 MG PO TABS: 10.0000 mg | ORAL_TABLET | Freq: Every day | ORAL | Status: DC

## 2024-08-01 MED ORDER — METHOCARBAMOL 500 MG PO TABS: 500.0000 mg | ORAL_TABLET | Freq: Three times a day (TID) | ORAL | Status: DC | PRN

## 2024-08-01 MED ORDER — AMLODIPINE BESYLATE 5 MG PO TABS
10.0000 mg | ORAL_TABLET | Freq: Once | ORAL | Status: AC
  Administered 2024-08-01: 10 mg via ORAL
  Filled 2024-08-01: qty 2

## 2024-08-01 MED ORDER — ONDANSETRON HCL 4 MG/2ML IJ SOLN
4.0000 mg | Freq: Once | INTRAMUSCULAR | Status: AC
  Administered 2024-08-01: 4 mg via INTRAVENOUS
  Filled 2024-08-01: qty 2

## 2024-08-01 MED ORDER — ASPIRIN 81 MG PO CHEW
81.0000 mg | CHEWABLE_TABLET | Freq: Every day | ORAL | Status: DC
  Administered 2024-08-01: 81 mg via ORAL
  Filled 2024-08-01: qty 1

## 2024-08-01 MED ORDER — POTASSIUM CHLORIDE CRYS ER 20 MEQ PO TBCR
40.0000 meq | EXTENDED_RELEASE_TABLET | Freq: Once | ORAL | Status: AC
  Administered 2024-08-01: 40 meq via ORAL
  Filled 2024-08-01: qty 2

## 2024-08-01 MED ORDER — IRBESARTAN 300 MG PO TABS: 300.0000 mg | ORAL_TABLET | Freq: Every day | ORAL | Status: DC

## 2024-08-01 MED ORDER — HYDRALAZINE HCL 50 MG PO TABS: 50.0000 mg | ORAL_TABLET | Freq: Two times a day (BID) | ORAL | Status: DC

## 2024-08-01 MED ORDER — CARVEDILOL 3.125 MG PO TABS: 6.2500 mg | ORAL_TABLET | Freq: Two times a day (BID) | ORAL | Status: DC

## 2024-08-01 MED ORDER — PANTOPRAZOLE SODIUM 40 MG PO TBEC
40.0000 mg | DELAYED_RELEASE_TABLET | Freq: Every day | ORAL | Status: DC
  Administered 2024-08-01: 40 mg via ORAL
  Filled 2024-08-01: qty 1

## 2024-08-01 MED ORDER — HYDRALAZINE HCL 20 MG/ML IJ SOLN: 5.0000 mg | Freq: Once | INTRAMUSCULAR | Status: DC

## 2024-08-01 MED ORDER — CLOPIDOGREL BISULFATE 75 MG PO TABS
75.0000 mg | ORAL_TABLET | Freq: Every day | ORAL | Status: DC
  Administered 2024-08-01: 75 mg via ORAL
  Filled 2024-08-01: qty 1

## 2024-08-01 MED ORDER — ACETAMINOPHEN 325 MG PO TABS: 650.0000 mg | ORAL_TABLET | Freq: Four times a day (QID) | ORAL | Status: DC | PRN

## 2024-08-01 MED ORDER — ISOSORBIDE MONONITRATE ER 30 MG PO TB24
30.0000 mg | ORAL_TABLET | Freq: Every day | ORAL | Status: DC
  Administered 2024-08-01: 30 mg via ORAL
  Filled 2024-08-01: qty 1

## 2024-08-01 MED ORDER — ENOXAPARIN SODIUM 40 MG/0.4ML IJ SOSY: 40.0000 mg | PREFILLED_SYRINGE | INTRAMUSCULAR | Status: DC

## 2024-08-01 MED ORDER — OXCARBAZEPINE 300 MG PO TABS
600.0000 mg | ORAL_TABLET | Freq: Three times a day (TID) | ORAL | Status: DC
  Administered 2024-08-01: 600 mg via ORAL
  Filled 2024-08-01: qty 2

## 2024-08-01 NOTE — ED Provider Notes (Signed)
  Physical Exam  BP 138/73   Pulse 60   Temp 98 F (36.7 C) (Oral)   Resp 13   Ht 5' 2 (1.575 m)   Wt 65.8 kg   LMP 03/06/2020   SpO2 99%   BMI 26.52 kg/m   Physical Exam Vitals and nursing note reviewed.  HENT:     Head: Normocephalic and atraumatic.  Eyes:     Pupils: Pupils are equal, round, and reactive to light.  Cardiovascular:     Rate and Rhythm: Normal rate and regular rhythm.  Pulmonary:     Effort: Pulmonary effort is normal.     Breath sounds: Normal breath sounds.  Abdominal:     Palpations: Abdomen is soft.     Tenderness: There is no abdominal tenderness.  Skin:    General: Skin is warm and dry.  Neurological:     Mental Status: She is alert.  Psychiatric:        Mood and Affect: Mood normal.     Procedures  Procedures  ED Course / MDM   Clinical Course as of 08/01/24 0954  Fri Aug 01, 2024  [id number] Patient reports resolution in her chest pain.  Blood pressure now normotensive.  Delta troponin of 19 slightly increased from 13.  Will repeat EKG.  Considering extensive cardiac history including unstable angina and chest pain that woke her up from sleep with associated nausea vomiting diaphoresis, will plan for admission.  Patient agreeable.  Discussed with admitting hospitalist Dr. Kristy who accepts patient for admission [MP]    Clinical Course User Index [MP] Pamella Ozell LABOR, DO   Medical Decision Making I, Ozell Pamella DO, have assumed care of this patient from the previous provider pending delta troponin, reevaluation of blood pressure, reevaluation of chest pain and disposition  Amount and/or Complexity of Data Reviewed Labs: ordered. Radiology: ordered.  Risk Prescription drug management. Decision regarding hospitalization.          Pamella Ozell LABOR, DO 08/01/24 [phone]

## 2024-08-01 NOTE — ED Notes (Signed)
 Pt feeling nauseated, asking for nausea meds before taking PO meds. MD notified, no new orders at this time.

## 2024-08-01 NOTE — Discharge Summary (Signed)
 Name: Anna Henson MRN: [id number] DOB: 05/02/1971 53 y.o. PCP: Patient, No Pcp Per  Date of Admission: 08/01/2024  4:36 AM Date of Discharge: 08/01/2024 Attending Physician: Dr. Jone Dauphin  Discharge Diagnosis: 1. Principal Problem:   Chest pain Active Problems:   Unstable angina (HCC)   Peripheral arterial disease   Resistant hypertension   Discharge Medications: Allergies as of 08/01/2024       Reactions   Porcine (pork) Protein-containing Drug Products Other (See Comments)   Does NOT eat pork        Medication List     TAKE these medications    amLODipine  10 MG tablet Commonly known as: NORVASC  Take 1 tablet (10 mg total) by mouth at bedtime.   anastrozole  1 MG tablet Commonly known as: ARIMIDEX  TAKE 1 TABLET(1 MG) BY MOUTH DAILY   aspirin  81 MG chewable tablet Chew 1 tablet (81 mg total) by mouth daily.   atorvastatin  80 MG tablet Commonly known as: LIPITOR  Take 1 tablet (80 mg total) by mouth daily.   baclofen  10 MG tablet Commonly known as: LIORESAL  TAKE 1 TABLET BY MOUTH FOUR TIMES DAILY   carvedilol  6.25 MG tablet Commonly known as: COREG  Take 1 tablet (6.25 mg total) by mouth 2 (two) times daily with a meal.   clopidogrel  75 MG tablet Commonly known as: PLAVIX  Take 1 tablet (75 mg total) by mouth daily.   fluticasone  50 MCG/ACT nasal spray Commonly known as: FLONASE  Place 1-2 sprays into both nostrils daily as needed for allergies or rhinitis.   furosemide  20 MG tablet Commonly known as: LASIX  Take 1 tablet (20 mg total) by mouth daily. FOR FLUID RETENTION   gabapentin  300 MG capsule Commonly known as: NEURONTIN  Take 3 capsules (900 MG) by mouth three times daily   hydrALAZINE  50 MG tablet Commonly known as: APRESOLINE  Take one tablet twice per day. If blood pressure more than 180, okay to take additional 50mg  tablet.   isosorbide  mononitrate 30 MG 24 hr tablet Commonly known as: IMDUR  Take 1 tablet (30 mg total) by  mouth daily.   methocarbamol  500 MG tablet Commonly known as: ROBAXIN  TAKE 1 TABLET(500 MG) BY MOUTH THREE TIMES DAILY AS NEEDED FOR MUSCLE SPASMS   nitroGLYCERIN  0.4 MG SL tablet Commonly known as: NITROSTAT  Place 1 tablet (0.4 mg total) under the tongue every 5 (five) minutes as needed for chest pain. PLACE 1 TABLET UNDER THE TONGUE EVERY 5 MINUTES AS NEEDED FOR CHEST PAIN   olmesartan 40 MG tablet Commonly known as: BENICAR Take 1 tablet (40 mg total) by mouth daily.   oxcarbazepine  600 MG tablet Commonly known as: TRILEPTAL  TAKE 1 TABLET(600 MG) BY MOUTH THREE TIMES DAILY   Repatha  SureClick 140 MG/ML Soaj Generic drug: Evolocumab  ADMINISTER 1 ML UNDER THE SKIN EVERY 14 DAYS   spironolactone  50 MG tablet Commonly known as: ALDACTONE  Take 1 tablet (50 mg total) by mouth daily.   traMADol  50 MG tablet Commonly known as: ULTRAM  TAKE 1 TABLET(50 MG) BY MOUTH UP TO THREE TIMES A DAILY AS NEEDED   venlafaxine  XR 75 MG 24 hr capsule Commonly known as: EFFEXOR -XR TAKE 1 CAPSULE BY MOUTH DAILY WITH BREAKFAST        Disposition and follow-up:   Anna Henson was discharged from [facility] in Stable condition.  At the hospital follow up visit please address:  1.  Antihypertensive medication consolidation and adherence. Will need follow up with cardiology as well.   2.  Labs / imaging needed at time of follow-up: BMP, VAS doppler renal arteries  3.  Pending labs/ test needing follow-up: Lipid panel  Follow-up Appointments:  Connected patient with social work to help patient establish with primary care doctor. Offered out IMTS clinic, as well.   Hospital Course by problem list: Anna Henson is a 53 y.o. female with PMH of resistant hypertension, CAD and peripheral arterial disease, chronic pain on therapeutic opiates, depression, and hx of breast cancer admitted to the hospital with hypertensive emergency and troponin leak, now being  discharged from the hospital  Hypertensive emergency Resistant hypertension luctuating Bps at home per patient, working on medication adjustments. Still being worked up at Cardiology office. Currently leading diagnosis is potentially renal artery stenosis per 06/2023 with>60% stenosis of the L renal artery. With chest pain and troponin leak on admission. No EKG changes. Troponin peaked without recurrence of chest pain once BP was stable. Resumed antihypertensives at discharge. Will need consolidation of medications at follow up to help with adherence. Scheduled to has VAS doppler of renal arteries this year to monitor progression. This is second admission with hypertensive emergency this year.  Unstable angina CAD, prior NSTEMI 02/2024 RCA stents 2015 Patient with hx of CAD and unstable angina with symptoms likely exacerbated in setting of uncontrolled hypertension. Prior NSTEMI on 02/2024; LHC during that admission noted small caliber AV grove circumflex with ostial 90% stenosis, not amenable to revascularization and likely not the reason for chest pain. Medical management recommended. During this admission, initial EKG without signs of ischemia. Troponin 13>19>16. No chest pain on initial evaluation by our team or discharge.   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 No post prandial pain.  No weight loss. No metabolic acidosis. No claudication symptoms - Will need repeat studies outpatient  Hypokalemia - Supplemented during admission. Will need repeat BMP at follow up  Stable chronic medical conditions: resumed PTA medications Peripheral arterial disease.  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 L common iliac and proximal L external iliac stents 2022 Hyperlipidemia Depression GERD Chronic pain syndrome  Subjective Feeling better. Offer stay in the hospital for continued observation but patient wishes to go home, which I think it is reasonable. No chest pain, shortness of breath, n/v. Tolerated PO intake.  Discharge Exam:   BP (!) 147/72   Pulse 62   Temp 98.6 F (37 C) (Oral)   Resp 12   Ht 5' 2 (1.575 m)   Wt 65.8 kg   LMP 03/06/2020   SpO2 100%   BMI 26.52 kg/m  Discharge exam:  General: Pleasant, well-appearing woman laying in bed. No acute distress. Head: Normocephalic. Atraumatic. CV: RRR. No murmurs, rubs, or gallops. No LE edema Pulmonary: Lungs CTAB. Normal effort. No wheezing or rales. Abdominal: Soft, nontender, nondistended. Normal bowel sounds. Extremities: Palpable radial and DP pulses.  Skin: Warm and dry.  Neuro: A&Ox3. Psych: Normal mood and affect   Pertinent Labs, Studies, and Procedures:     Latest Ref Rng & Units 08/01/2024    4:46 AM 05/20/2024    9:12 AM 05/20/2024    2:46 AM  CBC  WBC  4.0 - 10.5 K/uL 14.4  10.5  11.7   Hemoglobin 12.0 - 15.0 g/dL 85.8  85.7  86.3   Hematocrit 36.0 - 46.0 % 39.6  41.0  38.8   Platelets 150 - 400 K/uL 422  444  413        Latest Ref Rng & Units 08/01/2024    4:46 AM 05/20/2024    9:12 AM 05/20/2024    2:46 AM  CMP  Glucose 70 - 99 mg/dL 798  86  97   BUN 6 - 20 mg/dL 9  13  11    Creatinine 0.44 - 1.00 mg/dL 9.09  9.04  9.08   Sodium 135 - 145 mmol/L 130  128  132   Potassium 3.5 - 5.1 mmol/L 3.3  4.4  4.1   Chloride 98 - 111 mmol/L 97  99  101   CO2 22 - 32 mmol/L 20  19  21    Calcium  8.9 - 10.3 mg/dL 9.2  9.7  9.9   Total Protein 6.5 - 8.1 g/dL  7.2  6.9   Total Bilirubin 0.0 - 1.2 mg/dL  0.3  0.5   Alkaline Phos 38 - 126 U/L  82  80   AST 15 - 41 U/L  30  28   ALT 0 - 44 U/L  34  29     DG Chest 2  View Result Date: 08/01/2024 EXAM: 2 VIEW(S) XRAY OF THE CHEST 08/01/2024 05:32:23 AM COMPARISON: 05/19/2024 CLINICAL HISTORY: chest pain. [facility], PT was from home when she woke up to chest pain that hasn't stopped FINDINGS: LINES, TUBES AND DEVICES: Overlying wires and leads. LUNGS AND PLEURA: No focal pulmonary opacity. No pulmonary edema. No pleural effusion. No pneumothorax. HEART AND MEDIASTINUM: No acute abnormality of the cardiac and mediastinal silhouettes. BONES AND SOFT TISSUES: Surgical clips in right axilla and breast. No acute osseous abnormality. IMPRESSION: 1. No acute cardiopulmonary process. Electronically signed by: Waddell Calk MD 08/01/2024 06:54 AM EDT RP Workstation: [id number]     Discharge Instructions: Discharge Instructions     Diet - low sodium heart healthy   Complete by: As directed    Discharge instructions   Complete by: As directed    Dear Ms. Ahart,  You were admitted to the hospital because of your chest pain that we suspect was related to your high blood pressures. While here, we monitored your heart with EKG and troponins to ensure there was not need to have you see the heart doctors in the hospital. These tests were normal and I am now discharging you from the hospital.  It is important that you resume your medications as prescribed by your cardiologist. I encourage you to take your nitroglycerin  if you feel chest pain, and call EMS if the pain is persistent even if you take it.  Please call your primary care doctor and have a follow up with them in the next week or so.  Take care,  [facility]   Increase activity slowly   Complete by: As directed        Signed: Elnora Ip, MD 08/01/2024, 3:16 PM

## 2024-08-01 NOTE — ED Notes (Signed)
 Pt has provided urine sample if needed at bedside.

## 2024-08-01 NOTE — ED Notes (Signed)
 CCMD contacted.

## 2024-08-01 NOTE — Progress Notes (Signed)
 TOC consulted for PCP information. PCP providers for the under or uninsured added to AVS.

## 2024-08-01 NOTE — ED Provider Notes (Signed)
  EMERGENCY DEPARTMENT AT [facility] Provider Note   CSN: [id number] Arrival date & time: 08/01/24  9563     Patient presents with: Chest Pain   Anna Henson is a 53 y.o. female.   53 yo F w/ poorly controlled HTN here with chest pain. States compliance with medications. Woke up with chest pressure, nausea, diaphoresis. Persisted for awhile, called EMS, gave NTG/ASA and vomited it back up. Feels better now. No recent illnesses. No abdominal pain, distension, le edema or pain. No other associated symptoms.    Chest Pain      Prior to Admission medications   Medication Sig Start Date End Date Taking? Authorizing Provider  amLODipine  (NORVASC ) 10 MG tablet Take 1 tablet (10 mg total) by mouth at bedtime. 07/24/24   Vannie Reche RAMAN, NP  anastrozole  (ARIMIDEX ) 1 MG tablet TAKE 1 TABLET(1 MG) BY MOUTH DAILY 01/07/24   Odean Potts, MD  aspirin  81 MG chewable tablet Chew 1 tablet (81 mg total) by mouth daily. 09/16/14   Samtani, Jai-Gurmukh, MD  atorvastatin  (LIPITOR ) 80 MG tablet Take 1 tablet (80 mg total) by mouth daily. 11/05/23   Mona Vinie BROCKS, MD  baclofen  (LIORESAL ) 10 MG tablet TAKE 1 TABLET BY MOUTH FOUR TIMES DAILY 03/11/24   Sater, Charlie LABOR, MD  carvedilol  (COREG ) 6.25 MG tablet Take 1 tablet (6.25 mg total) by mouth 2 (two) times daily with a meal. 07/24/24   Vannie Reche RAMAN, NP  clopidogrel  (PLAVIX ) 75 MG tablet Take 1 tablet (75 mg total) by mouth daily. 07/24/24   Vannie Reche RAMAN, NP  Evolocumab  (REPATHA  SURECLICK) 140 MG/ML SOAJ ADMINISTER 1 ML UNDER THE SKIN EVERY 14 DAYS 05/05/24   Hilty, Vinie BROCKS, MD  fluticasone  (FLONASE ) 50 MCG/ACT nasal spray Place 1-2 sprays into both nostrils daily as needed for allergies or rhinitis.    [provider]  furosemide  (LASIX ) 20 MG tablet Take 1 tablet (20 mg total) by mouth daily. FOR FLUID RETENTION 07/24/24   Vannie Reche RAMAN, NP  gabapentin  (NEURONTIN ) 300 MG capsule Take 3 capsules (900  MG) by mouth three times daily 07/10/24   Sater, Charlie LABOR, MD  hydrALAZINE  (APRESOLINE ) 50 MG tablet Take one tablet twice per day. If blood pressure more than 180, okay to take additional 50mg  tablet. 07/24/24   Vannie Reche RAMAN, NP  isosorbide  mononitrate (IMDUR ) 30 MG 24 hr tablet Take 1 tablet (30 mg total) by mouth daily. 07/24/24   Vannie Reche RAMAN, NP  methocarbamol  (ROBAXIN ) 500 MG tablet TAKE 1 TABLET(500 MG) BY MOUTH THREE TIMES DAILY AS NEEDED FOR MUSCLE SPASMS 05/13/24   Sater, Charlie LABOR, MD  nitroGLYCERIN  (NITROSTAT ) 0.4 MG SL tablet Place 1 tablet (0.4 mg total) under the tongue every 5 (five) minutes as needed for chest pain. PLACE 1 TABLET UNDER THE TONGUE EVERY 5 MINUTES AS NEEDED FOR CHEST PAIN 03/05/24   Rana Lum CROME, NP  olmesartan (BENICAR) 40 MG tablet Take 1 tablet (40 mg total) by mouth daily. 07/24/24   Vannie Reche RAMAN, NP  oxcarbazepine  (TRILEPTAL ) 600 MG tablet TAKE 1 TABLET(600 MG) BY MOUTH THREE TIMES DAILY 06/10/24   Sater, Charlie LABOR, MD  spironolactone  (ALDACTONE ) 50 MG tablet Take 1 tablet (50 mg total) by mouth daily. 07/24/24   Vannie Reche RAMAN, NP  traMADol  (ULTRAM ) 50 MG tablet TAKE 1 TABLET(50 MG) BY MOUTH UP TO THREE TIMES A DAILY AS NEEDED 07/10/24   Sater, Charlie LABOR, MD  venlafaxine  XR (EFFEXOR -XR) 75  MG 24 hr capsule TAKE 1 CAPSULE BY MOUTH DAILY WITH BREAKFAST 02/05/24   Odean Potts, MD    Allergies: Porcine (pork) protein-containing drug products    Review of Systems  Cardiovascular:  Positive for chest pain.    Updated Vital Signs BP (!) 164/88   Pulse (!) 59   Temp 97.9 F (36.6 C) (Temporal)   Resp 12   Ht 5' 2 (1.575 m)   Wt 65.8 kg   LMP 03/06/2020   SpO2 100%   BMI 26.52 kg/m   Physical Exam Vitals and nursing note reviewed.  Constitutional:      Appearance: She is well-developed.  HENT:     Head: Normocephalic and atraumatic.  Cardiovascular:     Rate and Rhythm: Normal rate and regular rhythm.  Pulmonary:     Effort:  No respiratory distress.     Breath sounds: No stridor.  Abdominal:     General: There is no distension.  Musculoskeletal:     Cervical back: Normal range of motion.  Neurological:     Mental Status: She is alert.     (all labs ordered are listed, but only abnormal results are displayed) Labs Reviewed  BASIC METABOLIC PANEL WITH GFR - Abnormal; Notable for the following components:      Result Value   Sodium 130 (*)    Potassium 3.3 (*)    Chloride 97 (*)    CO2 20 (*)    Glucose, Bld 201 (*)    All other components within normal limits  CBC - Abnormal; Notable for the following components:   WBC 14.4 (*)    Platelets 422 (*)    All other components within normal limits  PROTIME-INR  TROPONIN I (HIGH SENSITIVITY)  TROPONIN I (HIGH SENSITIVITY)    EKG: EKG Interpretation Date/Time:  Friday August 01 2024 04:48:12 EDT Ventricular Rate:  76 PR Interval:  144 QRS Duration:  86 QT Interval:  425 QTC Calculation: 478 R Axis:   67  Text Interpretation: Sinus rhythm Probable left atrial enlargement Confirmed by Lorette Mayo [phone] on 08/01/2024 5:00:09 AM  Radiology: No results found.   Procedures   Medications Ordered in the ED  hydrALAZINE  (APRESOLINE ) injection 5 mg (0 mg Intravenous Hold 08/01/24 0604)  amLODipine  (NORVASC ) tablet 10 mg (10 mg Oral Given 08/01/24 0606)                                    Medical Decision Making Amount and/or Complexity of Data Reviewed Labs: ordered. Radiology: ordered.  Risk Prescription drug management.   Hypertensive which could be causing her symptoms. Strong symmetric pulses, doubt dissection. Doubt PE.   First trop and ecg reassuring. BP improving some, will give home dose of norvasc . Not nauseous currently. Suspect symptoms related to BP.   Care transferred pending second troponin and reevaluation for disposition.   Final diagnoses:  None    ED Discharge Orders     None          Lachanda Buczek, Mayo,  MD 08/01/24 [phone]

## 2024-08-01 NOTE — Discharge Instructions (Signed)
  Clinics for the Uninsured [facility] para personas sin seguro)   [facility] [address] [place], [place] [address] Hours of Operation Monday and Thursday 9AM - 8PM Tuesday and Wednesday 9AM - 5PM Friday, Saturday and Sunday Closed Phone [phone] (English and [place]) Email [email or web address]  Providing primary, dental, and vision care, mental health services, and medication assistance to those who can't afford it.   [facility] [employer] [address], [place] Monday - Wednesday 8:30 AM - 5 PM Thursday 8:30 AM - 8 PM $5 per visit - Call for an eligibility appointment [phone]  [facility] [address] [place], [address] Monday - Friday 8:30 AM - 5:30 PM Saturdays 9 AM - 1 PM [phone] Serves patients from birth to age 72, providing well visits & sick child care - children who are chronically ill, developmentally delayed or affected by mental health issues.   [facility]) [address] [place], [place] Monday - Friday 9 AM - 6 PM Walk-in Clinic Monday - Thursday 9 AM - 10:30 AM [phone] or [phone] *This clinic sees patients with or without insurance*  [facility] Primary Care at [facility] [address] [place], [place] [address] [phone]  [facility] [address] Monday - Friday 9 AM - 1 PM 2nd & 4th Saturdays 9 AM - 1 PM Speak with Pam if you are self-pay (no insurance) [phone] or [phone]  Family Medicine at [facility] [address] [facility] Monday - Friday 8 AM - 5 PM On site pharmacy Monday - Friday 8 AM - 4:30 PM (closed for lunch 1-1:30 daily) As a [facility], they serve patients with or without insurance and connect families to necessary resources. Ph: [phone] Fax:  [phone]  [facility] [address] [place], [facility]  &  [address] [place], [place] [phone]    [phone] [email or web address] Monday, Tuesday, Thursday, Friday 8 AM - 5 PM Saturday 8 AM - 1 PM Wednesday - closed Medicare, Medicaid, most insurance For the uninsured, payment plans start at [employer] Staff speaks Spanish (la personal habla espaol)  [facility] [place], [place] [address] [place] [phone]  [facility] [address] [place], [facility]  &  [address], [place] [phone]    [phone] [place], $120 for self-pay (no insurance)   [facility] Phone: [phone] Text: [phone] Email: [email or web address] Offering first-rate mobile medical clinic, with space to comfortably and conveniently offer abdominal and trans-vaginal ultrasounds, pregnancy testing, counseling, and resources.  [facility] [address] [place], [place] [address] or [address] [place], [place] [address] [phone]   [facility] [address] [place], [place] [address] [phone] Providing quality holistic healthcare to the uninsured and underserved in greater [place]

## 2024-08-01 NOTE — ED Triage Notes (Signed)
 PT brought in by [facility], PT was from home when she woke up to chest pain that hasn't stopped for the last 2 hours. PT is havig nausea and vomiting as well as clammy and sweaty. PT is alert and talking states the pain is in her chest in the center 10/10. PT was given 4 of zofran  and 6mg  of morphine  by ems. PT states she did not take her nitroglycerin  at home due to vomiting

## 2024-08-01 NOTE — H&P (Addendum)
 Date: 08/01/2024               Patient Name:  Anna Henson MRN: [id number]  DOB: [birth year] Age / Sex: 53 y.o., female   PCP: Patient, No Pcp Per         Medical Service: Internal Medicine Teaching Service         Attending Physician: Dr. Jone Dauphin      First Contact: Schuyler Novak, DO}    Second Contact: Dr. Drue Grow, MD         Pager Information: First Contact Pager: [phone]   Second Contact Pager: [phone]   SUBJECTIVE   Chief Complaint: chest pain  History of Present Illness: Ryian Lynde is a 53 y.o. female with PMH of resistant hypertension, CAD and peripheral arterial disease, chronic pain on therapeutic opiates, depression, and hx of breast cancer, who presented to [facility] via EMS with chest pain.   Patient reports having difficulty with her blood pressure for which she is followed with cardiology outpatient. She was driving home yesterday when she noted to have a dull chest pain in the middle of her chest that did not radiate or was associated with accompanying symptoms. She does not like taking nitroglycerine PRN for her known anginal chest pain because associated headache as a side effect. He did not note worsening of this chest pain with ambulation into her home. She did not check her BP at the time and chose to go to sleep last night.   This morning, she she woke up to walk to the bathroom, noted to have a severe headache, drenched in sweat, intense nausea, and an episode of vomiting with the same chest pain she felt the night prior. This is when she checked her BP at her SBP was 200 and DBP 109. She called EMS and was brought to the ED. En route, she received nitroglycerine, which helped with the chest pain. Once in the the ED, her BP improved and she is not currently endorsing any of the aforementioned symptoms.  Patient reports adherence to all her medications, except the TID dosing of the hydralazine . Her SBPS have been 150-180s. Since she was last seen  by cardiology. Took all medications prior to admission to hospital last night. Denies URI, abdominal pain, shortness of breath, changes in mental status or vision, recent travel or surgeries, illicit drug use.  Other significant ROS: abdominal discomfort when taking some of her medications. No claudication or abdominal pain with or after food. No lower extremity claudication symptoms. Has no noted swelling or weight changes. No recent emotional stressors.   Meds:  Patient reported:  Amlodipine  10 mg daily Hydralazine  50 mg TID, not always - extra 50 mg PRN for SBP >180 Isosorbide  mononitrate 30 mg daily Carvedilol  3.125 daily Plavix  75 mg daily Furosemide  20 mg daily Olmesartan 40 mg daily Spironolactone  50 mg daily Nitroglycerine 0.4 mg PRN Atorvastatin  80 mg daily Repatha  140 Tramadol  50 mg TID Venlafaxine  75 mg daily Gabapentin  900 mg TID Oxcarbazepine  600 mg daily  Previously on Anastrazole 1 mg daily  Past Medical History Past Medical History:  Diagnosis Date   Anemia    Anxiety    Bipolar disorder in full remission    CAD in native artery cardiologist--  dr hilty   a. 09/2014 Cath/PCI in setting of [place]  - s/p 2.25 x 12 mm Promus Premier DES to mid RCA;  b. 11/2016 Myoview : EF 56%, no ischemia;  c. 12/2016 NSTEMI/PCI: LM nl, LAd 25p,  LCX 30p, RCA 100p (2.75x38 Promus Premier DES), mRCA 10 ISR, EF 50-55%.   CKD (chronic kidney disease), stage III (HCC)    Depression    Genetic testing 04/23/2017   Ms. Brester underwent genetic counseling and testing for hereditary cancer syndromes on 04/05/2017. Her results were negative for mutations in all 46 genes analyzed by [facility]'s 46-gene Common Hereditary Cancers Panel. Genes analyzed include: APC, ATM, AXIN2, BARD1, BMPR1A, BRCA1, BRCA2, BRIP1, CDH1, CDKN2A, CHEK2, CTNNA1, DICER1, EPCAM, GREM1, HOXB13, KIT, MEN1, MLH1, MSH2, MSH3, MSH6, MUTYH, NBN,   GERD (gastroesophageal reflux disease)    Headache    History of external beam  radiation therapy    right breast 07-27-2017  to 09-25-2017   History of non-ST elevation myocardial infarction (NSTEMI)    HOCM (hypertrophic obstructive cardiomyopathy) (HCC) followed by cardiology   a. 12/2016 Echo: EF 65-70%,  mod conc LVH, dynamic obstruction @ rest, peak velocity of 291 cm/sec w/ peak gradient of , no rwma, Gr1 DD, triv TR, PASP .   Hyperlipidemia    Hypertension    Malignant neoplasm of lower-outer quadrant of right breast of female, estrogen receptor positive [facility]) oncologist--- dr odean   dx 06/ 2018--- right breast invasive lobular carcinoma, ductal carcinoma w/ LCIS---- 06-21-2017 s/p right breast lumpecotmy w/ sln disseciton;   completed radiation 09-25-2017;  on tamoxifen    Myocardial infarction [facility])    2017   S/P drug eluting coronary stent placement    09-14-2014---  PCI with DES x1 to midRCA;  12-31-2016  PCI with DES x1 to proxRCA   Transverse myelitis [facility]) neurologist--- dr vear   01/ 2017  dx transverse myelitis w/ right side numbness (09-01-2019  currently lower extremity weakness, mucsle spasms, and gait disturbance)   Wears glasses      Past Surgical History Past Surgical History:  Procedure Laterality Date   ABDOMINAL AORTOGRAM W/LOWER EXTREMITY N/A 08/11/2021   Procedure: ABDOMINAL AORTOGRAM W/LOWER EXTREMITY;  Surgeon: Court Dorn PARAS, MD;  Location: [facility] INVASIVE CV LAB;  Service: Cardiovascular;  Laterality: N/A;   BREAST LUMPECTOMY WITH RADIOACTIVE SEED AND SENTINEL LYMPH NODE BIOPSY Right 06/21/2017   Procedure: RIGHT BREAST BRACKETED SEED GUIDED LUMPECTOMY AND SENTINEL LYMPH NODE BIOPSY;  Surgeon: Curvin Deward MOULD, MD;  Location: [facility] OR;  Service: General;  Laterality: Right;   CORONARY/GRAFT ACUTE MI REVASCULARIZATION N/A 12/31/2016   Procedure: Coronary/Graft Acute MI Revascularization;  Surgeon: Ozell Fell, MD;  Location: [facility] INVASIVE CV LAB;  Service: Cardiovascular;  Laterality: N/A;   DILATATION & CURETTAGE/HYSTEROSCOPY WITH  MYOSURE N/A 09/02/2019   Procedure: DILATATION & CURETTAGE/HYSTEROSCOPY WITH MYOSURE;  Surgeon: Kandyce Sor, MD;  Location: [facility];  Service: Gynecology;  Laterality: N/A;   HERNIA REPAIR     LEFT HEART CATH AND CORONARY ANGIOGRAPHY N/A 12/31/2016   Procedure: Left Heart Cath and Coronary Angiography;  Surgeon: Ozell Fell, MD;  Location: [facility] INVASIVE CV LAB;  Service: Cardiovascular;  Laterality: N/A;   LEFT HEART CATH AND CORONARY ANGIOGRAPHY N/A 02/11/2024   Procedure: LEFT HEART CATH AND CORONARY ANGIOGRAPHY;  Surgeon: Elmira Newman PARAS, MD;  Location: [facility] INVASIVE CV LAB;  Service: Cardiovascular;  Laterality: N/A;   LEFT HEART CATHETERIZATION WITH CORONARY ANGIOGRAM N/A 09/14/2014   Procedure: LEFT HEART CATHETERIZATION WITH CORONARY ANGIOGRAM;  Surgeon: Lonni JONETTA Cash, MD;  Location: [facility] CATH LAB;  Service: Cardiovascular;  Laterality: N/A;   MASS EXCISION N/A 03/30/2020   Procedure: EXCISION MASS RIGHT LABIA MINORA;  Surgeon: Viktoria Comer SAUNDERS, MD;  Location: [facility];  Service: Gynecology;  Laterality: N/A;   PERCUTANEOUS CORONARY STENT INTERVENTION (PCI-S)  09/14/2014   Procedure: PERCUTANEOUS CORONARY STENT INTERVENTION (PCI-S);  Surgeon: Lonni JONETTA Cash, MD;  Location: [facility] CATH LAB;  Service: Cardiovascular;;   PERIPHERAL VASCULAR INTERVENTION  08/11/2021   Procedure: PERIPHERAL VASCULAR INTERVENTION;  Surgeon: Court Dorn PARAS, MD;  Location: [facility] INVASIVE CV LAB;  Service: Cardiovascular;;   ROBOTIC ASSISTED SALPINGO OOPHERECTOMY Bilateral 03/30/2020   Procedure: XI ROBOTIC ASSISTED SALPINGO OOPHORECTOMY;  Surgeon: Viktoria Comer SAUNDERS, MD;  Location: [facility] ORS;  Service: Gynecology;  Laterality: Bilateral;   TONSILLECTOMY  2005   UMBILICAL HERNIA REPAIR  2001; 2005     Social:  Lives by herself Support: Mother and niece Level of Function: independent with iADLs and ADLs PCP:  Patient, No Pcp Per  Substances: -Tobacco: denies -Alcohol:  denies -Recreational Drug: denies  Family History:  Family History  Problem Relation Age of Onset   Diabetes Mother    Heart disease Mother    Hyperlipidemia Mother    Hypertension Mother    Breast cancer Mother    Lung cancer Father    Drug abuse Father    Brain cancer Father 51   Bone cancer Father 7   Drug abuse Sister    Mental illness Brother    Mental illness Maternal Grandmother    Stomach cancer Paternal Grandmother 35       d.75     Allergies: Allergies as of 08/01/2024 - Review Complete 08/01/2024  Allergen Reaction Noted   Porcine (pork) protein-containing drug products Other (See Comments) 08/28/2017    Review of Systems: A complete ROS was negative except as per HPI.   OBJECTIVE:   Physical Exam: Blood pressure 138/73, pulse 60, temperature 98 F (36.7 C), temperature source Oral, resp. rate 13, height 5' 2 (1.575 m), weight 65.8 kg, last menstrual period 03/06/2020, SpO2 99%.  Constitutional: well-appearing woman laying in bed, in no acute distress HENT: normocephalic atraumatic, mucous membranes moist Eyes: conjunctiva non-erythematous Neck: supple, no JVP Cardiovascular: regular rate and rhythm, no m/r/g, equal and present bilateral radial, DP and PT pulses Pulmonary/Chest: normal work of breathing on room air, lungs clear to auscultation bilaterally Abdominal: soft, non-tender, non-distended. No suprapubic or epigastric tenderness MSK: normal bulk and tone. No reproducibility on substernal chest pain. No pain currently Neurological: alert & oriented x 3, 5/5 strength in bilateral upper and lower extremities, normal gait Skin: warm and dry Psych: Worried affect  Labs: CBC    Component Value Date/Time   WBC 14.4 (H) 08/01/2024 0446   RBC 4.51 08/01/2024 0446   HGB 14.1 08/01/2024 0446   HGB 13.8 11/08/2022 0918   HGB 11.6 04/04/2017 0835   HCT 39.6 08/01/2024 0446   HCT 40.0 11/08/2022 0918   HCT 33.5 (L) 04/04/2017 0835   PLT 422 (H)  08/01/2024 0446   PLT 355 11/08/2022 0918   MCV 87.8 08/01/2024 0446   MCV 91 11/08/2022 0918   MCV 90.8 04/04/2017 0835   MCH 31.3 08/01/2024 0446   MCHC 35.6 08/01/2024 0446   RDW 14.3 08/01/2024 0446   RDW 14.8 11/08/2022 0918   RDW 14.8 (H) 04/04/2017 0835   LYMPHSABS 3.4 05/20/2024 0912   LYMPHSABS 3.8 (H) 11/08/2022 0918   LYMPHSABS 2.1 04/04/2017 0835   MONOABS 0.8 05/20/2024 0912   MONOABS 0.6 04/04/2017 0835   EOSABS 0.2 05/20/2024 0912   EOSABS 0.3 11/08/2022 0918   BASOSABS 0.1 05/20/2024 0912   BASOSABS 0.0 11/08/2022 9081  BASOSABS 0.1 04/04/2017 0835     CMP     Component Value Date/Time   NA 130 (L) 08/01/2024 0446   NA 141 10/29/2023 0837   NA 139 04/04/2017 0835   K 3.3 (L) 08/01/2024 0446   K 3.6 04/04/2017 0835   CL 97 (L) 08/01/2024 0446   CO2 20 (L) 08/01/2024 0446   CO2 25 04/04/2017 0835   GLUCOSE 201 (H) 08/01/2024 0446   GLUCOSE 83 04/04/2017 0835   BUN 9 08/01/2024 0446   BUN 14 10/29/2023 0837   BUN 13.9 04/04/2017 0835   CREATININE 0.90 08/01/2024 0446   CREATININE 1.19 (H) 04/01/2018 1015   CREATININE 1.3 (H) 04/04/2017 0835   CALCIUM  9.2 08/01/2024 0446   CALCIUM  10.0 04/04/2017 0835   PROT 7.2 05/20/2024 0912   PROT 6.7 11/08/2022 0918   PROT 6.7 04/04/2017 0835   ALBUMIN 4.1 05/20/2024 0912   ALBUMIN 4.4 11/08/2022 0918   ALBUMIN 3.9 04/04/2017 0835   AST 30 05/20/2024 0912   AST 18 04/01/2018 1015   AST 23 04/04/2017 0835   ALT 34 05/20/2024 0912   ALT 11 04/01/2018 1015   ALT 22 04/04/2017 0835   ALKPHOS 82 05/20/2024 0912   ALKPHOS 69 04/04/2017 0835   BILITOT 0.3 05/20/2024 0912   BILITOT <0.2 11/08/2022 0918   BILITOT <0.2 (L) 04/01/2018 1015   BILITOT <0.22 04/04/2017 0835   GFRNONAA >60 08/01/2024 0446   GFRNONAA 54 (L) 04/01/2018 1015   GFRNONAA 56 (L) 11/24/2015 1113   GFRAA >60 03/23/2020 1541   GFRAA >60 04/01/2018 1015   GFRAA 65 11/24/2015 1113    Imaging:  DG Chest 2 View Result Date:  08/01/2024 EXAM: 2 VIEW(S) XRAY OF THE CHEST 08/01/2024 05:32:23 AM COMPARISON: 05/19/2024 CLINICAL HISTORY: chest pain. [facility], PT was from home when she woke up to chest pain that hasn't stopped FINDINGS: LINES, TUBES AND DEVICES: Overlying wires and leads. LUNGS AND PLEURA: No focal pulmonary opacity. No pulmonary edema. No pleural effusion. No pneumothorax. HEART AND MEDIASTINUM: No acute abnormality of the cardiac and mediastinal silhouettes. BONES AND SOFT TISSUES: Surgical clips in right axilla and breast. No acute osseous abnormality. IMPRESSION: 1. No acute cardiopulmonary process. Electronically signed by: Waddell Calk MD 08/01/2024 06:54 AM EDT RP Workstation: [id number]    TTE 02/2024 Left Ventricle: Mild SAM in the setting of moderate symmetrical left  ventricular hypertrophy. Mildly turbulent LVOT flow but no significant  gradient. Left ventricular ejection fraction, by estimation, is 60 to 65%.  The left ventricle has normal  function. The left ventricle demonstrates regional wall motion  abnormalities. Strain was performed and the global longitudinal strain is  indeterminate. The left ventricular internal cavity size was normal in  size. There is moderate left ventricular  hypertrophy. Left ventricular diastolic parameters are indeterminate.  LHC 02/2024  Coronary angiography 02/11/2024: LM: Normal LAD: Prox 20% disease Lcx: Large OM branches with no significant disease        Small caliber AV grove circumflex with ostial 90% stenosis (new since 2018) RCA: Prox-mid stents with prox 40%, mid 50% ISR          Prox RPDA 40% stenosis   LVEDP 30 mmHg      AV grove circumflex too small to intervene on. Moreover, her presentation likely more consistent with hypertensive urgency than true plaque rupture NSTEMI. She has had at least moderate renal artery stenosis in the past. Consider further evaluation for renal artery stenosis as potential etiology for hypertensive  urgency.      EKG: personally reviewed my interpretation is sinus rhythm, no ST or T wave abnormalities. Prior EKG 08/16/2024  ASSESSMENT & PLAN:   Assessment & Plan by Problem: Principal Problem:   Chest pain Active Problems:   Unstable angina (HCC)   Hypertensive urgency   Peripheral arterial disease   Resistant hypertension  Hypertensive emergency Resistant hypertension Fluctuating Bps at home per patient, working on medication adjustments. Still being worked up at Cardiology office. Currently leading diagnosis is potentially renal artery stenosis per 06/2023 with>60% stenosis of the L renal artery. Mild troponin leak currently, no evidence of encephalopathy or any other end organ dysfunction. Without chest pain currently. Euvolemic on exam. Has not needed IV antihypertensives. Took many of her medications prior to admission; was not able to do stepwise approach of blood pressure lowering. Will continue most of her home medications given complex cardiac history. - Resume current medications:  - Hydralazine  50 mg TID  - Imdur  30 mg daily  - Carvedilol  BID - Holding Olmesartan equivalent and Spironolactone ; add these medications prior to discharge. - Consider consolidation of meds to help with adherence and ease for patient - Holding Furosemide ; no PND, orthopnea. Stable weight. - Outpatient renal artery doppler VAS US   Chest pain Unstable angina CAD, prior NSTEMI 02/2024 RCA stents 2015 Patient with hx of CAD and unstable angina with symptoms likely exacerbated in setting of uncontrolled hypertension. Prior NSTEMI on 02/2024; LHC during that admission noted small caliber AV grove circumflex with ostial 90% stenosis, not amenable to revascularization and likely not the reason for chest pain. Medical management recommended. During this admission, initial EKG without signs of ischemia. Troponin 13>19. Patient denies current chest pain. Radial and distal pulses are equal and bilateral; low likelihood of  aortic dissection. 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  Suspect troponin elevation in the setting of demand ischemia after severe hypertension.  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 No post prandial pain.  No weight loss. No metabolic acidosis. No claudication symptoms - Will need repeat studies outpatient - If new, persistent abdominal pain, especially with persistent uncontrolled hypertension  - check STAT lactic acid  - CTA of abdomen - Resumed diet this admission  Hyperlipidemia Severe disease Per notes, inconsistently taking these medications.  - continued on Atorvastatin  80 mg daily - Repatha  PTA  - Lipid panel here  Hypokalemia - Supplemented  Chronic pain syndrome Peripheral neuropathy - Resumed Gabapentin  900 mg TID  Depression - Resumed Oxcarbazepine  and venlafaxine   GERD Prior history of this. Has been having increased symptoms lately. Patient current on DAPT, likely contributing - pantoprazole  40 mg daily added - Also added sucrafate TID  History of breast cancer, Right Follows with Dr. Odean   Best practice: Diet: Heart Healthy VTE: Enoxaparin  Code: Full  Disposition planning: Prior to Admission Living Arrangement: Home, living by herself Anticipated Discharge Location: Home  Dispo: Admit patient to Observation with expected length of stay less than 2 midnights.  Signed: Gomez-Caraballo,  Hadassah, MD Internal Medicine Resident   08/01/2024, 11:39 AM  On Call pager: [phone]

## 2024-08-01 NOTE — Hospital Course (Addendum)
 Chest pain that was woke her up from sleep Hypertensive 13>19  Unstable angina   Summer Kelleher - niece  [phone]

## 2024-08-10 ENCOUNTER — Other Ambulatory Visit: Payer: Self-pay | Admitting: Neurology

## 2024-08-10 ENCOUNTER — Other Ambulatory Visit: Payer: Self-pay | Admitting: Hematology and Oncology

## 2024-08-21 ENCOUNTER — Ambulatory Visit (INDEPENDENT_AMBULATORY_CARE_PROVIDER_SITE_OTHER)

## 2024-08-21 ENCOUNTER — Ambulatory Visit (HOSPITAL_BASED_OUTPATIENT_CLINIC_OR_DEPARTMENT_OTHER): Payer: Self-pay | Admitting: Family

## 2024-08-21 DIAGNOSIS — N2889 Other specified disorders of kidney and ureter: Secondary | ICD-10-CM

## 2024-08-21 DIAGNOSIS — I701 Atherosclerosis of renal artery: Secondary | ICD-10-CM | POA: Diagnosis not present

## 2024-08-21 DIAGNOSIS — I10 Essential (primary) hypertension: Secondary | ICD-10-CM | POA: Diagnosis not present

## 2024-08-21 DIAGNOSIS — I1A0 Resistant hypertension: Secondary | ICD-10-CM

## 2024-08-22 NOTE — Telephone Encounter (Signed)
 The patient has been notified of the result and verbalized understanding.  All questions (if any) were answered.  Pt aware we will order for her to get a CT ANGIO W/WO CONTRAST to be done to further evaluate her renal artery stenosis.  Pt aware we have placed the order and will have our scheduling team reach out to her sometime soon to arrange this appointment.  Pt verbalized understanding and agrees with this plan.

## 2024-08-22 NOTE — Telephone Encounter (Signed)
-----   Message from Reche GORMAN Finder sent at 08/22/2024 10:55 AM EST ----- Discussed with Dr. Court. Renal artery duplex shows increased stenosis compared to prior. Recommend CT angio w/ and w/o contrast (IMG [phone]) for further evaluation of renal artery stenosis.  ----- Message ----- From: Interface, [employer] One Seven Sent: 08/21/2024  11:59 AM EST To: Reche GORMAN Finder, NP

## 2024-08-26 ENCOUNTER — Other Ambulatory Visit: Payer: Self-pay

## 2024-08-26 ENCOUNTER — Encounter (HOSPITAL_COMMUNITY): Payer: Self-pay

## 2024-08-26 ENCOUNTER — Emergency Department (HOSPITAL_COMMUNITY)

## 2024-08-26 ENCOUNTER — Observation Stay (HOSPITAL_COMMUNITY)
Admission: EM | Admit: 2024-08-26 | Discharge: 2024-08-28 | Disposition: A | Discharge status: Home / Self Care | Attending: Emergency Medicine | Admitting: Emergency Medicine

## 2024-08-26 DIAGNOSIS — Z87891 Personal history of nicotine dependence: Secondary | ICD-10-CM | POA: Insufficient documentation

## 2024-08-26 DIAGNOSIS — N183 Chronic kidney disease, stage 3 unspecified: Secondary | ICD-10-CM | POA: Diagnosis not present

## 2024-08-26 DIAGNOSIS — I129 Hypertensive chronic kidney disease with stage 1 through stage 4 chronic kidney disease, or unspecified chronic kidney disease: Principal | ICD-10-CM | POA: Insufficient documentation

## 2024-08-26 DIAGNOSIS — E785 Hyperlipidemia, unspecified: Secondary | ICD-10-CM | POA: Diagnosis not present

## 2024-08-26 DIAGNOSIS — I5032 Chronic diastolic (congestive) heart failure: Secondary | ICD-10-CM | POA: Insufficient documentation

## 2024-08-26 DIAGNOSIS — D72829 Elevated white blood cell count, unspecified: Secondary | ICD-10-CM | POA: Insufficient documentation

## 2024-08-26 DIAGNOSIS — D649 Anemia, unspecified: Secondary | ICD-10-CM | POA: Insufficient documentation

## 2024-08-26 DIAGNOSIS — I701 Atherosclerosis of renal artery: Secondary | ICD-10-CM

## 2024-08-26 DIAGNOSIS — R079 Chest pain, unspecified: Secondary | ICD-10-CM | POA: Diagnosis present

## 2024-08-26 DIAGNOSIS — N2889 Other specified disorders of kidney and ureter: Secondary | ICD-10-CM

## 2024-08-26 DIAGNOSIS — C50511 Malignant neoplasm of lower-outer quadrant of right female breast: Secondary | ICD-10-CM | POA: Insufficient documentation

## 2024-08-26 DIAGNOSIS — Z7982 Long term (current) use of aspirin: Secondary | ICD-10-CM | POA: Diagnosis not present

## 2024-08-26 DIAGNOSIS — I161 Hypertensive emergency: Principal | ICD-10-CM

## 2024-08-26 DIAGNOSIS — I421 Obstructive hypertrophic cardiomyopathy: Secondary | ICD-10-CM | POA: Diagnosis not present

## 2024-08-26 DIAGNOSIS — F418 Other specified anxiety disorders: Secondary | ICD-10-CM | POA: Insufficient documentation

## 2024-08-26 DIAGNOSIS — U071 COVID-19: Secondary | ICD-10-CM | POA: Insufficient documentation

## 2024-08-26 DIAGNOSIS — Z79899 Other long term (current) drug therapy: Secondary | ICD-10-CM | POA: Diagnosis not present

## 2024-08-26 DIAGNOSIS — I1A0 Resistant hypertension: Secondary | ICD-10-CM | POA: Diagnosis not present

## 2024-08-26 LAB — TROPONIN I (HIGH SENSITIVITY)
Troponin I (High Sensitivity): 25 ng/L — ABNORMAL HIGH (ref <18)
Troponin I (High Sensitivity): 25 ng/L — ABNORMAL HIGH (ref <18)

## 2024-08-26 LAB — RESP PANEL BY RT-PCR (RSV, FLU A&B, COVID)  RVPGX2
Influenza A by PCR: NEGATIVE
Influenza B by PCR: NEGATIVE
Resp Syncytial Virus by PCR: NEGATIVE
SARS Coronavirus 2 by RT PCR: NEGATIVE

## 2024-08-26 LAB — CBC
HCT: 39.4 % (ref 36.0–46.0)
Hemoglobin: 13.9 g/dL (ref 12.0–15.0)
MCH: 31.2 pg (ref 26.0–34.0)
MCHC: 35.3 g/dL (ref 30.0–36.0)
MCV: 88.5 fL (ref 80.0–100.0)
Platelets: 400 K/uL (ref 150–400)
RBC: 4.45 MIL/uL (ref 3.87–5.11)
RDW: 13.9 % (ref 11.5–15.5)
WBC: 15.1 K/uL — ABNORMAL HIGH (ref 4.0–10.5)
nRBC: 0 % (ref 0.0–0.2)

## 2024-08-26 LAB — BASIC METABOLIC PANEL WITH GFR
Anion gap: 14 (ref 5–15)
BUN: 8 mg/dL (ref 6–20)
CO2: 18 mmol/L — ABNORMAL LOW (ref 22–32)
Calcium: 9.7 mg/dL (ref 8.9–10.3)
Chloride: 99 mmol/L (ref 98–111)
Creatinine, Ser: 0.79 mg/dL (ref 0.44–1.00)
GFR, Estimated: >60 mL/min (ref >60)
Glucose, Bld: 86 mg/dL (ref 70–99)
Potassium: 4.5 mmol/L (ref 3.5–5.1)
Sodium: 131 mmol/L — ABNORMAL LOW (ref 135–145)

## 2024-08-26 MED ORDER — ATORVASTATIN CALCIUM 80 MG PO TABS
80.0000 mg | ORAL_TABLET | Freq: Every day | ORAL | Status: DC
  Administered 2024-08-26 – 2024-08-28 (×3): 80 mg via ORAL
  Filled 2024-08-26: qty 2
  Filled 2024-08-26: qty 1
  Filled 2024-08-26: qty 2

## 2024-08-26 MED ORDER — ACETAMINOPHEN 500 MG PO TABS: 500.0000 mg | ORAL_TABLET | Freq: Four times a day (QID) | ORAL | Status: DC | PRN

## 2024-08-26 MED ORDER — HYDRALAZINE HCL 20 MG/ML IJ SOLN
10.0000 mg | Freq: Once | INTRAMUSCULAR | Status: AC
  Administered 2024-08-26: 10 mg via INTRAVENOUS
  Filled 2024-08-26: qty 1

## 2024-08-26 MED ORDER — IPRATROPIUM-ALBUTEROL 0.5-2.5 (3) MG/3ML IN SOLN
3.0000 mL | Freq: Once | RESPIRATORY_TRACT | Status: AC
  Administered 2024-08-26: 3 mL via RESPIRATORY_TRACT
  Filled 2024-08-26: qty 3

## 2024-08-26 MED ORDER — ASPIRIN 325 MG PO TABS
325.0000 mg | ORAL_TABLET | Freq: Once | ORAL | Status: AC
  Administered 2024-08-26: 325 mg via ORAL
  Filled 2024-08-26: qty 1

## 2024-08-26 MED ORDER — POLYETHYLENE GLYCOL 3350 17 G PO PACK: 17.0000 g | PACK | Freq: Every day | ORAL | Status: DC | PRN

## 2024-08-26 MED ORDER — MORPHINE SULFATE (PF) 4 MG/ML IV SOLN
4.0000 mg | Freq: Once | INTRAVENOUS | Status: AC
  Administered 2024-08-26: 4 mg via INTRAVENOUS
  Filled 2024-08-26: qty 1

## 2024-08-26 MED ORDER — VENLAFAXINE HCL ER 75 MG PO CP24
75.0000 mg | ORAL_CAPSULE | Freq: Every day | ORAL | Status: DC
  Administered 2024-08-27 – 2024-08-28 (×2): 75 mg via ORAL
  Filled 2024-08-26 (×2): qty 1

## 2024-08-26 MED ORDER — ASPIRIN 81 MG PO CHEW
81.0000 mg | CHEWABLE_TABLET | Freq: Every day | ORAL | Status: DC
  Administered 2024-08-27 – 2024-08-28 (×2): 81 mg via ORAL
  Filled 2024-08-26 (×2): qty 1

## 2024-08-26 MED ORDER — CARVEDILOL 6.25 MG PO TABS
6.2500 mg | ORAL_TABLET | Freq: Two times a day (BID) | ORAL | Status: DC
  Administered 2024-08-26 – 2024-08-28 (×3): 6.25 mg via ORAL
  Filled 2024-08-26 (×3): qty 2
  Filled 2024-08-26: qty 1

## 2024-08-26 MED ORDER — ONDANSETRON HCL 4 MG/2ML IJ SOLN
4.0000 mg | Freq: Once | INTRAMUSCULAR | Status: AC
  Administered 2024-08-26: 4 mg via INTRAVENOUS
  Filled 2024-08-26: qty 2

## 2024-08-26 MED ORDER — HYDRALAZINE HCL 50 MG PO TABS
50.0000 mg | ORAL_TABLET | Freq: Two times a day (BID) | ORAL | Status: DC
  Administered 2024-08-26 – 2024-08-27 (×2): 50 mg via ORAL
  Filled 2024-08-26 (×2): qty 2

## 2024-08-26 MED ORDER — MELATONIN 5 MG PO TABS
5.0000 mg | ORAL_TABLET | Freq: Every evening | ORAL | Status: DC | PRN
Start: 2024-08-26 — End: 2024-08-28

## 2024-08-26 MED ORDER — PROCHLORPERAZINE EDISYLATE 10 MG/2ML IJ SOLN
5.0000 mg | Freq: Four times a day (QID) | INTRAMUSCULAR | Status: DC | PRN
  Administered 2024-08-27: 5 mg via INTRAVENOUS
  Filled 2024-08-26: qty 2

## 2024-08-26 MED ORDER — MORPHINE SULFATE (PF) 2 MG/ML IV SOLN
2.0000 mg | INTRAVENOUS | Status: DC | PRN
Start: 2024-08-26 — End: 2024-08-27
  Administered 2024-08-26 – 2024-08-27 (×2): 2 mg via INTRAVENOUS
  Filled 2024-08-26 (×2): qty 1

## 2024-08-26 NOTE — ED Triage Notes (Signed)
 Pt from home with ems c.o chest pain and nausea. Recent dx of pneumonia. Pain when she breathes and coughs. Pt c.o increased anxiety due to finding out her mom passed away today.

## 2024-08-26 NOTE — ED Notes (Signed)
 Per [facility], they do not have previously sent specimen for respiratory panel that was sent hours ago. Sample recollected and resent in tube. EDP aware.

## 2024-08-26 NOTE — ED Notes (Signed)
 Unable to obtain second troponin at this time. Phlebotomy to see patient when available.

## 2024-08-26 NOTE — ED Provider Notes (Cosign Needed Addendum)
 [place] [facility] Provider Note   CSN: [id number] Arrival date & time: 08/26/24  1534     Patient presents with: Chest Pain and Nausea   Anna Henson is a 53 y.o. female with past medical history seen for hypertension, unstable angina, hyperlipidemia, CAD, previous ACS, previous NSTEMI, history of tobacco abuse but has been tobacco free for around 10 years, CKD, GERD who presents concern for chest pain, nausea, shortness of breath.  Has been recently treated for pneumonia, completed antibiotics greater than 1 week ago, reports that she has continued to feel poorly but has not sought any additional care.  Reports that she found out her mom passed away today, she took her normal blood pressure medication but has 8 out of 10 left-sided chest pressure.    Chest Pain      Prior to Admission medications   Medication Sig Start Date End Date Taking? Authorizing Provider  amLODipine  (NORVASC ) 10 MG tablet Take 1 tablet (10 mg total) by mouth at bedtime. 07/24/24   Vannie Reche RAMAN, NP  anastrozole  (ARIMIDEX ) 1 MG tablet TAKE 1 TABLET(1 MG) BY MOUTH DAILY 01/07/24   Odean Potts, MD  aspirin  81 MG chewable tablet Chew 1 tablet (81 mg total) by mouth daily. 09/16/14   Samtani, Jai-Gurmukh, MD  atorvastatin  (LIPITOR ) 80 MG tablet Take 1 tablet (80 mg total) by mouth daily. 11/05/23   Mona Vinie BROCKS, MD  baclofen  (LIORESAL ) 10 MG tablet TAKE 1 TABLET BY MOUTH FOUR TIMES DAILY 03/11/24   Sater, Charlie LABOR, MD  carvedilol  (COREG ) 6.25 MG tablet Take 1 tablet (6.25 mg total) by mouth 2 (two) times daily with a meal. 07/24/24   Vannie Reche RAMAN, NP  clopidogrel  (PLAVIX ) 75 MG tablet Take 1 tablet (75 mg total) by mouth daily. 07/24/24   Vannie Reche RAMAN, NP  Evolocumab  (REPATHA  SURECLICK) 140 MG/ML SOAJ ADMINISTER 1 ML UNDER THE SKIN EVERY 14 DAYS 05/05/24   Hilty, Vinie BROCKS, MD  fluticasone  (FLONASE ) 50 MCG/ACT nasal spray Place 1-2 sprays into both  nostrils daily as needed for allergies or rhinitis.    [provider]  furosemide  (LASIX ) 20 MG tablet Take 1 tablet (20 mg total) by mouth daily. FOR FLUID RETENTION 07/24/24   Vannie Reche RAMAN, NP  gabapentin  (NEURONTIN ) 300 MG capsule Take 3 capsules (900 MG) by mouth three times daily 07/10/24   Sater, Charlie LABOR, MD  hydrALAZINE  (APRESOLINE ) 50 MG tablet Take one tablet twice per day. If blood pressure more than 180, okay to take additional 50mg  tablet. 07/24/24   Vannie Reche RAMAN, NP  isosorbide  mononitrate (IMDUR ) 30 MG 24 hr tablet Take 1 tablet (30 mg total) by mouth daily. 07/24/24   Vannie Reche RAMAN, NP  methocarbamol  (ROBAXIN ) 500 MG tablet TAKE 1 TABLET(500 MG) BY MOUTH THREE TIMES DAILY AS NEEDED FOR MUSCLE SPASMS 05/13/24   Sater, Charlie LABOR, MD  nitroGLYCERIN  (NITROSTAT ) 0.4 MG SL tablet Place 1 tablet (0.4 mg total) under the tongue every 5 (five) minutes as needed for chest pain. PLACE 1 TABLET UNDER THE TONGUE EVERY 5 MINUTES AS NEEDED FOR CHEST PAIN 03/05/24   Rana Lum CROME, NP  olmesartan (BENICAR) 40 MG tablet Take 1 tablet (40 mg total) by mouth daily. 07/24/24   Walker, Caitlin S, NP  oxcarbazepine  (TRILEPTAL ) 600 MG tablet TAKE 1 TABLET(600 MG) BY MOUTH THREE TIMES DAILY 08/11/24   Sater, Charlie LABOR, MD  spironolactone  (ALDACTONE ) 50 MG tablet Take 1 tablet (50  mg total) by mouth daily. 07/24/24   Vannie Reche RAMAN, NP  traMADol  (ULTRAM ) 50 MG tablet TAKE 1 TABLET(50 MG) BY MOUTH UP TO THREE TIMES A DAILY AS NEEDED 07/10/24   Sater, Charlie LABOR, MD  venlafaxine  XR (EFFEXOR -XR) 75 MG 24 hr capsule TAKE 1 CAPSULE BY MOUTH DAILY WITH BREAKFAST 08/12/24   Odean Potts, MD    Allergies: Porcine (pork) protein-containing drug products    Review of Systems  Cardiovascular:  Positive for chest pain.  All other systems reviewed and are negative.   Updated Vital Signs BP (!) 160/97   Pulse 81   Temp 98 F (36.7 C) (Oral)   Resp 20   Ht 5' 2 (1.575 m)   Wt 65.8 kg    LMP 03/06/2020   SpO2 99%   BMI 26.53 kg/m   Physical Exam Vitals and nursing note reviewed.  Constitutional:      General: She is not in acute distress.    Appearance: Normal appearance.  HENT:     Head: Normocephalic and atraumatic.  Eyes:     General:        Right eye: No discharge.        Left eye: No discharge.  Cardiovascular:     Rate and Rhythm: Normal rate and regular rhythm.     Heart sounds: No murmur heard.    No friction rub. No gallop.  Pulmonary:     Effort: Pulmonary effort is normal.     Breath sounds: Normal breath sounds.     Comments: Mild expiratory wheezing, with some tachypnea, no rhonchi, stridor, rales noted. Abdominal:     General: Bowel sounds are normal.     Palpations: Abdomen is soft.  Skin:    General: Skin is warm and dry.     Capillary Refill: Capillary refill takes less than 2 seconds.  Neurological:     Mental Status: She is alert and oriented to person, place, and time.  Psychiatric:        Mood and Affect: Mood normal.        Behavior: Behavior normal.     (all labs ordered are listed, but only abnormal results are displayed) Labs Reviewed  CBC - Abnormal; Notable for the following components:      Result Value   WBC 15.1 (*)    All other components within normal limits  BASIC METABOLIC PANEL WITH GFR - Abnormal; Notable for the following components:   Sodium 131 (*)    CO2 18 (*)    All other components within normal limits  TROPONIN I (HIGH SENSITIVITY) - Abnormal; Notable for the following components:   Troponin I (High Sensitivity) 25 (*)    All other components within normal limits  TROPONIN I (HIGH SENSITIVITY) - Abnormal; Notable for the following components:   Troponin I (High Sensitivity) 25 (*)    All other components within normal limits  RESP PANEL BY RT-PCR (RSV, FLU A&B, COVID)  [id number]    EKG: EKG Interpretation Date/Time:  Tuesday August 26 2024 16:20:22 EST Ventricular Rate:  63 PR  Interval:  144 QRS Duration:  82 QT Interval:  440 QTC Calculation: 451 R Axis:   59  Text Interpretation: Sinus rhythm Baseline wander in lead(s) II V3 Confirmed by Randol Simmonds [phone]) on 08/26/2024 4:30:47 PM  Radiology: [facility] Chest Portable 1 View Result Date: 08/26/2024 CLINICAL DATA:  Chest pain. EXAM: PORTABLE CHEST 1 VIEW COMPARISON:  Chest radiograph dated 08/01/2024 FINDINGS: No focal consolidation, pleural  effusion, or pneumothorax. The cardiac silhouette is within normal limits. No acute osseous pathology. Right chest wall surgical clips. IMPRESSION: No active disease. Electronically Signed   By: Vanetta Chou M.D.   On: 08/26/2024 16:49     Procedures   Medications Ordered in the ED  ipratropium-albuterol (DUONEB) 0.5-2.5 (3) MG/3ML nebulizer solution 3 mL (3 mLs Nebulization Given 08/26/24 1628)  hydrALAZINE  (APRESOLINE ) injection 10 mg (10 mg Intravenous Given 08/26/24 1712)  ondansetron  (ZOFRAN ) injection 4 mg (4 mg Intravenous Given 08/26/24 1709)  morphine  (PF) 4 MG/ML injection 4 mg (4 mg Intravenous Given 08/26/24 1924)                                    Medical Decision Making Amount and/or Complexity of Data Reviewed Labs: ordered. Radiology: ordered.  Risk Prescription drug management.   This patient is a 53 y.o. female  who presents to the ED for concern of chest pain, shortness of breath.   Differential diagnoses prior to evaluation: The emergent differential diagnosis includes, but is not limited to,  ACS, AAS, PE, Mallory-Weiss, Boerhaave's, Pneumonia, acute bronchitis, asthma or COPD exacerbation, anxiety, MSK pain or traumatic injury to the chest, acid reflux versus other . This is not an exhaustive differential.   Past Medical History / Co-morbidities / Social History: Hypertension, hyperlipidemia, CAD, malignant hypertension  Additional history: Chart reviewed. Pertinent results include: Reviewed outpatient cardiology notes, lab work, imaging  from previous emergency department visits  Physical Exam: Physical exam performed. The pertinent findings include: Mild expiratory wheezing, with some tachypnea, no rhonchi, stridor, rales noted.  Significant hypertension, blood pressure 205/110 on arrival.  Mild tachypnea.  Stable oxygen saturation on room air.  Blood pressure improved mildly to 185/94 on recheck.   Lab Tests/Imaging studies: I personally interpreted labs/imaging and the pertinent results include: CBC notable for leukocytosis, white blood cell 15.1.  BMP notable for hyponatremia, sodium 131, bicarb deficit, CO2 18.  Initial troponin is elevated at 25.  I independently interpreted plain film chest x-ray which shows no evidence of acute intrathoracic abnormality.  Delta troponin shows flat at 25.  RVP shows negative for COVID, flu, RSV. I agree with the radiologist interpretation.  Cardiac monitoring: EKG obtained and interpreted by myself and attending physician which shows: Normal normal sinus rhythm, no acute ST-T changes   Medications: I ordered medication including hydralazine  for elevated blood pressure, Zofran  for nausea, DuoNeb for mild wheezing.  I have reviewed the patients home medicines and have made adjustments as needed.   Disposition: After consideration of the diagnostic results and the patients response to treatment, I feel that patient with significant elevated blood pressure, 205/110 on arrival, elevated troponin, I do believe that she would benefit from hospital admission for hypertensive urgency vs emergency  Consults: I spoke with the hospitalist, Dr. Shona who agrees to admission for hypertensive emergency   Final diagnoses:  Hypertensive emergency    ED Discharge Orders     None          Rosan Sherlean DEL, PA-C 08/26/24 2059    Rosan Sherlean DEL DEVONNA 08/26/24 2102    Randol Simmonds, MD 08/27/24 1229

## 2024-08-26 NOTE — H&P (Incomplete)
 History and Physical  Anna Henson FMW:[id number] DOB: 24-Apr-1971 DOA: 08/26/2024  Referring physician: Rosan Handler, PA-EDP  PCP: Patient, No Pcp Per  Outpatient Specialists: Cardiology. Patient coming from: Home.  Chief Complaint: Chest pain.  HPI: Anna Henson is a 53 y.o. female with medical history significant for resistant hypertension on 5 blood pressure medications, history of NSTEMI, breast cancer on anastrozole , chronic HFpEF, hokum, coronary artery disease status post PCI with stenting, hyperlipidemia, chronic anxiety/depression, who presents to the ER with complaints of chest pain after the unexpected death of her mother today.  She had been doing fine until the news of the death of her mother.  She describes her chest pain as chest pressure, severe, 8+ out of 10.  This happened around 2:30 PM.  She took her blood pressure medications and then vomited.  EMS was activated.  Upon EMS arrival the patient's blood pressure was over 200s systolically and over 110 diastolically.  She was brought into the ER for further evaluation.  In the ER, no evidence of acute ischemia on twelve-lead EKG.  High-sensitivity troponin 25, 25, 19.  EDP discussed the case with cardiology recommended admission by medicine team for chest pain rule out ACS.  Admitted by [facility], hospitalist service.  ED Course: Temperature 98.7.  Blood pressure 122/71, pulse 82, respiration rate 14, O2 saturation 100% on room air.  Review of Systems: Review of systems as noted in the HPI. All other systems reviewed and are negative.   Past Medical History:  Diagnosis Date   Anemia    Anxiety    Bipolar disorder in full remission    CAD in native artery cardiologist--  dr hilty   a. 09/2014 [place]/PCI in setting of [place]  - s/p 2.25 x 12 mm Promus Premier DES to mid RCA;  b. 11/2016 Myoview : EF 56%, no ischemia;  c. 12/2016 NSTEMI/PCI: LM nl, LAd 25p, LCX 30p, RCA 100p (2.75x38 Promus Premier DES), mRCA 10 ISR,  EF 50-55%.   CKD (chronic kidney disease), stage III (HCC)    Depression    Genetic testing 04/23/2017   Ms. Toomey underwent genetic counseling and testing for hereditary cancer syndromes on 04/05/2017. Her results were negative for mutations in all 46 genes analyzed by [facility]'s 46-gene Common Hereditary Cancers Panel. Genes analyzed include: APC, ATM, AXIN2, BARD1, BMPR1A, BRCA1, BRCA2, BRIP1, CDH1, CDKN2A, CHEK2, CTNNA1, DICER1, EPCAM, GREM1, HOXB13, KIT, MEN1, MLH1, MSH2, MSH3, MSH6, MUTYH, NBN,   GERD (gastroesophageal reflux disease)    Headache    History of external beam radiation therapy    right breast 07-27-2017  to 09-25-2017   History of non-ST elevation myocardial infarction (NSTEMI)    HOCM (hypertrophic obstructive cardiomyopathy) (HCC) followed by cardiology   a. 12/2016 Echo: EF 65-70%,  mod conc LVH, dynamic obstruction @ rest, peak velocity of 291 cm/sec w/ peak gradient of , no rwma, Gr1 DD, triv TR, PASP .   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[facility]) neurologist--- dr  sater   01/ 2017  dx transverse myelitis w/ right side numbness (09-01-2019  currently lower extremity weakness, mucsle spasms, and gait disturbance)   Wears glasses    Past Surgical History:  Procedure Laterality Date   ABDOMINAL AORTOGRAM W/LOWER EXTREMITY N/A 08/11/2021   Procedure: ABDOMINAL AORTOGRAM W/LOWER EXTREMITY;  Surgeon: Court Dorn PARAS, MD;  Location: [facility] INVASIVE CV LAB;  Service:  Cardiovascular;  Laterality: N/A;   BREAST LUMPECTOMY WITH RADIOACTIVE SEED AND SENTINEL LYMPH NODE BIOPSY Right 06/21/2017   Procedure: RIGHT BREAST BRACKETED SEED GUIDED LUMPECTOMY AND SENTINEL LYMPH NODE BIOPSY;  Surgeon: Curvin Deward MOULD, MD;  Location: [facility] OR;  Service: General;  Laterality: Right;   CORONARY/GRAFT ACUTE MI REVASCULARIZATION N/A 12/31/2016   Procedure: Coronary/Graft Acute MI Revascularization;  Surgeon: Ozell Fell, MD;  Location: [facility] INVASIVE CV LAB;  Service: Cardiovascular;  Laterality: N/A;   DILATATION & CURETTAGE/HYSTEROSCOPY WITH MYOSURE N/A 09/02/2019   Procedure: DILATATION & CURETTAGE/HYSTEROSCOPY WITH MYOSURE;  Surgeon: Kandyce Sor, MD;  Location: [facility];  Service: Gynecology;  Laterality: N/A;   HERNIA REPAIR     LEFT HEART CATH AND CORONARY ANGIOGRAPHY N/A 12/31/2016   Procedure: Left Heart Cath and Coronary Angiography;  Surgeon: Ozell Fell, MD;  Location: [facility] INVASIVE CV LAB;  Service: Cardiovascular;  Laterality: N/A;   LEFT HEART CATH AND CORONARY ANGIOGRAPHY N/A 02/11/2024   Procedure: LEFT HEART CATH AND CORONARY ANGIOGRAPHY;  Surgeon: Elmira Newman PARAS, MD;  Location: [facility] INVASIVE CV LAB;  Service: Cardiovascular;  Laterality: N/A;   LEFT HEART CATHETERIZATION WITH CORONARY ANGIOGRAM N/A 09/14/2014   Procedure: LEFT HEART CATHETERIZATION WITH CORONARY ANGIOGRAM;  Surgeon: Lonni JONETTA Cash, MD;  Location: [facility] CATH LAB;  Service: Cardiovascular;  Laterality: N/A;   MASS EXCISION N/A 03/30/2020   Procedure: EXCISION MASS RIGHT LABIA MINORA;  Surgeon: Viktoria Comer SAUNDERS, MD;  Location: [facility] ORS;  Service: Gynecology;  Laterality: N/A;   PERCUTANEOUS CORONARY STENT INTERVENTION (PCI-S)  09/14/2014   Procedure: PERCUTANEOUS CORONARY STENT INTERVENTION (PCI-S);  Surgeon: Lonni JONETTA Cash, MD;  Location: [facility] CATH LAB;  Service: Cardiovascular;;   PERIPHERAL VASCULAR INTERVENTION  08/11/2021   Procedure: PERIPHERAL VASCULAR  INTERVENTION;  Surgeon: Court Dorn PARAS, MD;  Location: [facility] INVASIVE CV LAB;  Service: Cardiovascular;;   ROBOTIC ASSISTED SALPINGO OOPHERECTOMY Bilateral 03/30/2020   Procedure: XI ROBOTIC ASSISTED SALPINGO OOPHORECTOMY;  Surgeon: Viktoria Comer SAUNDERS, MD;  Location: [facility] ORS;  Service: Gynecology;  Laterality: Bilateral;   TONSILLECTOMY  2005   UMBILICAL HERNIA REPAIR  2001; 2005    Social History:  reports that she quit smoking about 8 years ago. Her smoking use included cigarettes. She started smoking about 38 years ago. She has a 15 pack-year smoking history. She has never used smokeless tobacco. She reports that she does not currently use alcohol after a past usage of about 2.0 standard drinks of alcohol per week. She reports that she does not use drugs.   Allergies  Allergen Reactions   Porcine (Pork) Protein-Containing Drug Products Other (See Comments)    Does NOT eat pork    Family History  Problem Relation Age of Onset   Diabetes Mother    Heart disease Mother    Hyperlipidemia Mother    Hypertension Mother    Breast cancer Mother    Lung cancer Father    Drug abuse Father    Brain cancer Father 75   Bone cancer Father 65   Drug abuse Sister    Mental illness Brother  Mental illness Maternal Grandmother    Stomach cancer Paternal Grandmother 74       d.75      Prior to Admission medications   Medication Sig Start Date End Date Taking? Authorizing Provider  amLODipine  (NORVASC ) 10 MG tablet Take 1 tablet (10 mg total) by mouth at bedtime. 07/24/24   Vannie Reche RAMAN, NP  anastrozole  (ARIMIDEX ) 1 MG tablet TAKE 1 TABLET(1 MG) BY MOUTH DAILY 01/07/24   Odean Potts, MD  aspirin  81 MG chewable tablet Chew 1 tablet (81 mg total) by mouth daily. 09/16/14   Samtani, Jai-Gurmukh, MD  atorvastatin  (LIPITOR ) 80 MG tablet Take 1 tablet (80 mg total) by mouth daily. 11/05/23   Mona Vinie BROCKS, MD  baclofen  (LIORESAL ) 10 MG tablet TAKE 1 TABLET BY MOUTH FOUR TIMES DAILY 03/11/24    Sater, Charlie LABOR, MD  carvedilol  (COREG ) 6.25 MG tablet Take 1 tablet (6.25 mg total) by mouth 2 (two) times daily with a meal. 07/24/24   Vannie Reche RAMAN, NP  clopidogrel  (PLAVIX ) 75 MG tablet Take 1 tablet (75 mg total) by mouth daily. 07/24/24   Vannie Reche RAMAN, NP  Evolocumab  (REPATHA  SURECLICK) 140 MG/ML SOAJ ADMINISTER 1 ML UNDER THE SKIN EVERY 14 DAYS 05/05/24   Hilty, Vinie BROCKS, MD  fluticasone  (FLONASE ) 50 MCG/ACT nasal spray Place 1-2 sprays into both nostrils daily as needed for allergies or rhinitis.    [provider]  furosemide  (LASIX ) 20 MG tablet Take 1 tablet (20 mg total) by mouth daily. FOR FLUID RETENTION 07/24/24   Vannie Reche RAMAN, NP  gabapentin  (NEURONTIN ) 300 MG capsule Take 3 capsules (900 MG) by mouth three times daily 07/10/24   Sater, Charlie LABOR, MD  hydrALAZINE  (APRESOLINE ) 50 MG tablet Take one tablet twice per day. If blood pressure more than 180, okay to take additional 50mg  tablet. 07/24/24   Vannie Reche RAMAN, NP  isosorbide  mononitrate (IMDUR ) 30 MG 24 hr tablet Take 1 tablet (30 mg total) by mouth daily. 07/24/24   Walker, Caitlin S, NP  methocarbamol  (ROBAXIN ) 500 MG tablet TAKE 1 TABLET(500 MG) BY MOUTH THREE TIMES DAILY AS NEEDED FOR MUSCLE SPASMS 05/13/24   Sater, Charlie LABOR, MD  nitroGLYCERIN  (NITROSTAT ) 0.4 MG SL tablet Place 1 tablet (0.4 mg total) under the tongue every 5 (five) minutes as needed for chest pain. PLACE 1 TABLET UNDER THE TONGUE EVERY 5 MINUTES AS NEEDED FOR CHEST PAIN 03/05/24   Rana Lum CROME, NP  olmesartan (BENICAR) 40 MG tablet Take 1 tablet (40 mg total) by mouth daily. 07/24/24   Vannie Reche RAMAN, NP  oxcarbazepine  (TRILEPTAL ) 600 MG tablet TAKE 1 TABLET(600 MG) BY MOUTH THREE TIMES DAILY 08/11/24   Sater, Charlie LABOR, MD  spironolactone  (ALDACTONE ) 50 MG tablet Take 1 tablet (50 mg total) by mouth daily. 07/24/24   Vannie Reche RAMAN, NP  traMADol  (ULTRAM ) 50 MG tablet TAKE 1 TABLET(50 MG) BY MOUTH UP TO THREE TIMES A DAILY  AS NEEDED 07/10/24   Sater, Charlie LABOR, MD  venlafaxine  XR (EFFEXOR -XR) 75 MG 24 hr capsule TAKE 1 CAPSULE BY MOUTH DAILY WITH BREAKFAST 08/12/24   Odean Potts, MD    Physical Exam: BP (!) 160/97   Pulse 81   Temp 98 F (36.7 C) (Oral)   Resp 20   Ht 5' 2 (1.575 m)   Wt 65.8 kg   LMP 03/06/2020   SpO2 99%   BMI 26.53 kg/m   General: 53 y.o. year-old female well developed well nourished in  no acute distress.  Alert and oriented x3. Cardiovascular: Regular rate and rhythm with no rubs or gallops.  No thyromegaly or JVD noted.  No lower extremity edema. 2/4 pulses in all 4 extremities. Respiratory: Clear to auscultation with no wheezes or rales. Good inspiratory effort. Abdomen: Soft nontender nondistended with normal bowel sounds x4 quadrants. Muskuloskeletal: No cyanosis, clubbing or edema noted bilaterally Neuro: CN II-XII intact, strength, sensation, reflexes Skin: No ulcerative lesions noted or rashes Psychiatry: Judgement and insight appear normal. Mood is appropriate for condition and setting          Labs on Admission:  Basic Metabolic Panel: Recent Labs  Lab 08/26/24 1709  NA 131*  K 4.5  CL 99  CO2 18*  GLUCOSE 86  BUN 8  CREATININE 0.79  CALCIUM  9.7   Liver Function Tests: No results for input(s): AST, ALT, ALKPHOS, BILITOT, PROT, ALBUMIN in the last 168 hours. No results for input(s): LIPASE, AMYLASE in the last 168 hours. No results for input(s): AMMONIA in the last 168 hours. CBC: Recent Labs  Lab 08/26/24 1709  WBC 15.1*  HGB 13.9  HCT 39.4  MCV 88.5  PLT 400   Cardiac Enzymes: No results for input(s): CKTOTAL, CKMB, CKMBINDEX, TROPONINI in the last 168 hours.  BNP (last 3 results) Recent Labs    02/10/24 0504  BNP 180.8*    ProBNP (last 3 results) No results for input(s): PROBNP in the last 8760 hours.  CBG: No results for input(s): GLUCAP in the last 168 hours.  Radiological Exams on Admission: DG  Chest Portable 1 View Result Date: 08/26/2024 CLINICAL DATA:  Chest pain. EXAM: PORTABLE CHEST 1 VIEW COMPARISON:  Chest radiograph dated 08/01/2024 FINDINGS: No focal consolidation, pleural effusion, or pneumothorax. The cardiac silhouette is within normal limits. No acute osseous pathology. Right chest wall surgical clips. IMPRESSION: No active disease. Electronically Signed   By: Vanetta Chou M.D.   On: 08/26/2024 16:49    EKG: I independently viewed the EKG done and my findings are as followed: Sinus rhythm rate of 73.  QTc 480.  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 No evidence of acute ischemia on twelve-lead EKG. Continue home DAPT aspirin , Plavix , Lipitor , Coreg , irbesartan Follow transthoracic echocardiogram Continue to closely monitor on telemetry  Hypertensive emergency Presented with SBP greater than 210/DBP 110 and elevated troponin Patient on 5 blood pressure medications prior to admission Resume home oral antihypertensives Closely monitor vital signs  Leukocytosis Presented with WBC of 15K Recent diagnosis of pneumonia Chest x-ray was nonacute No urinary symptoms Afebrile Repeat CBC in the morning  Breast cancer on anastrozole  Resume home anastrozole   Hyperlipidemia Resume home Lipitor   Chronic anxiety/depression Resume home Effexor    Time: 75 minutes.   DVT prophylaxis: Subcu Lovenox  daily.  Code Status: Full code.  Family Communication: None at bedside.  Disposition Plan: Admitted to telemetry unit.  Consults called: Cardiology consulted by EDP.  Admission status: Observation status.   Status is: Observation    Terry LOISE Hurst MD [facility] Pager [phone]  If 7PM-7AM, please contact  night-coverage [email or web address] Password [email or web address]  08/26/2024, 9:15 PM

## 2024-08-26 NOTE — ED Notes (Signed)
 EKG was done and given to the Blue MD.

## 2024-08-26 NOTE — ED Notes (Signed)
 Second RN at bedside for IV start attempt.

## 2024-08-26 NOTE — ED Notes (Signed)
 Pt had a run of 12 beats of VT, Dr. Shona admitting provider notified, awaiting for new orders.

## 2024-08-27 ENCOUNTER — Observation Stay (HOSPITAL_COMMUNITY)

## 2024-08-27 ENCOUNTER — Encounter (HOSPITAL_COMMUNITY): Payer: Self-pay | Admitting: Internal Medicine

## 2024-08-27 ENCOUNTER — Ambulatory Visit (HOSPITAL_BASED_OUTPATIENT_CLINIC_OR_DEPARTMENT_OTHER): Admission: RE | Admit: 2024-08-27 | Source: Ambulatory Visit

## 2024-08-27 ENCOUNTER — Other Ambulatory Visit: Payer: Self-pay

## 2024-08-27 DIAGNOSIS — I161 Hypertensive emergency: Secondary | ICD-10-CM

## 2024-08-27 DIAGNOSIS — R079 Chest pain, unspecified: Secondary | ICD-10-CM

## 2024-08-27 LAB — ECHOCARDIOGRAM COMPLETE
AR max vel: 2.4 cm2
AV Area VTI: 2.6 cm2
AV Area mean vel: 2.41 cm2
AV Mean grad: 8 mmHg
AV Peak grad: 14.7 mmHg
Ao pk vel: 1.92 m/s
Area-P 1/2: 2.31 cm2
Est EF: >75
Height: 62 in
S' Lateral: 1.5 cm
Weight: 2321 [oz_av]

## 2024-08-27 LAB — CBC
HCT: 36 % (ref 36.0–46.0)
Hemoglobin: 12.5 g/dL (ref 12.0–15.0)
MCH: 31.1 pg (ref 26.0–34.0)
MCHC: 34.7 g/dL (ref 30.0–36.0)
MCV: 89.6 fL (ref 80.0–100.0)
Platelets: 383 K/uL (ref 150–400)
RBC: 4.02 MIL/uL (ref 3.87–5.11)
RDW: 14.3 % (ref 11.5–15.5)
WBC: 9.7 K/uL (ref 4.0–10.5)
nRBC: 0 % (ref 0.0–0.2)

## 2024-08-27 LAB — BASIC METABOLIC PANEL WITH GFR
Anion gap: 11 (ref 5–15)
BUN: 12 mg/dL (ref 6–20)
CO2: 20 mmol/L — ABNORMAL LOW (ref 22–32)
Calcium: 9.7 mg/dL (ref 8.9–10.3)
Chloride: 103 mmol/L (ref 98–111)
Creatinine, Ser: 0.92 mg/dL (ref 0.44–1.00)
GFR, Estimated: >60 mL/min (ref >60)
Glucose, Bld: 107 mg/dL — ABNORMAL HIGH (ref 70–99)
Potassium: 4.2 mmol/L (ref 3.5–5.1)
Sodium: 134 mmol/L — ABNORMAL LOW (ref 135–145)

## 2024-08-27 LAB — TROPONIN I (HIGH SENSITIVITY)
Troponin I (High Sensitivity): 19 ng/L — ABNORMAL HIGH (ref <18)
Troponin I (High Sensitivity): 13 ng/L (ref <18)

## 2024-08-27 LAB — MAGNESIUM: Magnesium: 2.2 mg/dL (ref 1.7–2.4)

## 2024-08-27 MED ORDER — CLONIDINE HCL 0.1 MG PO TABS
0.1000 mg | ORAL_TABLET | Freq: Two times a day (BID) | ORAL | Status: DC
  Administered 2024-08-27 – 2024-08-28 (×3): 0.1 mg via ORAL
  Filled 2024-08-27 (×3): qty 1

## 2024-08-27 MED ORDER — FUROSEMIDE 20 MG PO TABS
20.0000 mg | ORAL_TABLET | Freq: Every day | ORAL | Status: DC
  Administered 2024-08-27 – 2024-08-28 (×2): 20 mg via ORAL
  Filled 2024-08-27 (×2): qty 1

## 2024-08-27 MED ORDER — CLOPIDOGREL BISULFATE 75 MG PO TABS
75.0000 mg | ORAL_TABLET | Freq: Every day | ORAL | Status: DC
  Administered 2024-08-27 – 2024-08-28 (×2): 75 mg via ORAL
  Filled 2024-08-27 (×2): qty 1

## 2024-08-27 MED ORDER — AMLODIPINE BESYLATE 10 MG PO TABS
10.0000 mg | ORAL_TABLET | Freq: Every day | ORAL | Status: DC
  Administered 2024-08-27 – 2024-08-28 (×2): 10 mg via ORAL
  Filled 2024-08-27: qty 2
  Filled 2024-08-27: qty 1

## 2024-08-27 MED ORDER — IOHEXOL 350 MG/ML SOLN
75.0000 mL | Freq: Once | INTRAVENOUS | Status: AC | PRN
  Administered 2024-08-27: 75 mL via INTRAVENOUS

## 2024-08-27 MED ORDER — GABAPENTIN 100 MG PO CAPS
100.0000 mg | ORAL_CAPSULE | Freq: Three times a day (TID) | ORAL | Status: DC
  Administered 2024-08-27 – 2024-08-28 (×3): 100 mg via ORAL
  Filled 2024-08-27 (×3): qty 1

## 2024-08-27 MED ORDER — MORPHINE SULFATE (PF) 2 MG/ML IV SOLN
2.0000 mg | INTRAVENOUS | Status: DC | PRN
  Administered 2024-08-27: 2 mg via INTRAVENOUS
  Filled 2024-08-27: qty 2

## 2024-08-27 MED ORDER — ISOSORBIDE MONONITRATE ER 30 MG PO TB24
30.0000 mg | ORAL_TABLET | Freq: Every day | ORAL | Status: DC
  Administered 2024-08-27: 30 mg via ORAL
  Filled 2024-08-27: qty 1

## 2024-08-27 MED ORDER — METHOCARBAMOL 500 MG PO TABS: 500.0000 mg | ORAL_TABLET | Freq: Three times a day (TID) | ORAL | Status: DC | PRN

## 2024-08-27 MED ORDER — ENOXAPARIN SODIUM 40 MG/0.4ML IJ SOSY
40.0000 mg | PREFILLED_SYRINGE | INTRAMUSCULAR | Status: DC
  Administered 2024-08-27: 40 mg via SUBCUTANEOUS
  Filled 2024-08-27: qty 0.4

## 2024-08-27 MED ORDER — SPIRONOLACTONE 25 MG PO TABS
50.0000 mg | ORAL_TABLET | Freq: Every day | ORAL | Status: DC
  Administered 2024-08-27 – 2024-08-28 (×2): 50 mg via ORAL
  Filled 2024-08-27 (×2): qty 2

## 2024-08-27 MED ORDER — ISOSORB DINITRATE-HYDRALAZINE 20-37.5 MG PO TABS
1.0000 | ORAL_TABLET | Freq: Three times a day (TID) | ORAL | Status: DC
  Administered 2024-08-27 – 2024-08-28 (×3): 1 via ORAL
  Filled 2024-08-27 (×3): qty 1

## 2024-08-27 MED ORDER — IRBESARTAN 75 MG PO TABS
37.5000 mg | ORAL_TABLET | Freq: Every day | ORAL | Status: DC
  Administered 2024-08-27 – 2024-08-28 (×2): 37.5 mg via ORAL
  Filled 2024-08-27 (×4): qty 0.5

## 2024-08-27 MED ORDER — ANASTROZOLE 1 MG PO TABS
1.0000 mg | ORAL_TABLET | Freq: Every day | ORAL | Status: DC
  Administered 2024-08-27: 1 mg via ORAL
  Filled 2024-08-27 (×3): qty 1

## 2024-08-27 MED ORDER — PROCHLORPERAZINE EDISYLATE 10 MG/2ML IJ SOLN
10.0000 mg | Freq: Four times a day (QID) | INTRAMUSCULAR | Status: DC | PRN
  Administered 2024-08-27: 10 mg via INTRAVENOUS
  Filled 2024-08-27: qty 2

## 2024-08-27 NOTE — Progress Notes (Signed)
  Echocardiogram 2D Echocardiogram has been performed.  Tinnie FORBES Gosling RDCS 08/27/2024, 10:30 AM

## 2024-08-27 NOTE — ED Notes (Addendum)
 CCMD called by this RN

## 2024-08-27 NOTE — Progress Notes (Signed)
 Anna Henson  FMW:[id number] DOB: [birth year] DOA: 08/26/2024 PCP: Patient, No Pcp Per    Brief Narrative:  53 year old with a history of resistant HTN, breast cancer on anastrozole , chronic diastolic CHF, HOCM, NSTEMI/CAD status post PCI with stenting, HLD, and chronic anxiety/depression who presented to the ER 11/18 with the acute onset of chest pain that occurred and she received the news of the unexpected death of her mother.  EMS was called to the home and the patient was noted to have a systolic blood pressure over 200 at the time.  In the ER initial EKG was without acute ischemic change.  High-sensitivity troponin was unimpressive with no acute peak.  Blood pressure at presentation was 210/110.  Goals of Care:   Code Status: Full Code   DVT prophylaxis: SCDs Start: 08/26/24 2108   Interim Hx: Afebrile since presentation.  Blood pressure remains quite labile with systolics over 200s at times that are coincident with her episodes of chest pain and shortness of breath.  No nausea or vomiting.  No abdominal pain.  Patient denies feeling particularly stressed or anxious at present.  She is most anxious about being discharged home without an answer because she knows she will need to come right back.  Assessment & Plan:  Chest pain Acute onset on learning of unexpected death of her mother -troponin trend not consistent with ACS -EKG without evidence of ACS -continue usual DAPT Lipitor  Coreg  and ARB -recurrent bouts in ED seem to be coincident with spikes in uncontrolled HTN -do not feel acute evaluation of coronaries is required but do feel that better control of blood pressure will be needed prior to discharge  Uncontrolled HTN Blood pressure remains quite labile with a range of 126-201 systolic -patient was due to have a CT angiogram of the abdomen and pelvis as an outpatient to rule out renal artery stenosis -I will go ahead and request that study now -all of her usual home blood  pressure medications have been resumed or changed in substitution as I titrate her medication regimen  Leukocytosis No evidence to suggest an acute infection -CXR presentation unrevealing -likely simple stress de-margination -resolved on follow-up  Breast cancer on anastrozole  Continue usual home dose  HLD Continue usual Lipitor  dose  Chronic anxiety/depression Continue usual Effexor  dose   Family Communication: No family present at time of exam Disposition: More consistent blood pressure control is required prior to discharge   Objective: Blood pressure 130/76, pulse 70, temperature 98.7 F (37.1 C), temperature source Oral, resp. rate 18, height 5' 2 (1.575 m), weight 65.8 kg, last menstrual period 03/06/2020, SpO2 99%. No intake or output data in the 24 hours ending 08/27/24 0839 Filed Weights   08/26/24 1541  Weight: 65.8 kg    Examination: General: No acute respiratory distress Lungs: Clear to auscultation bilaterally without wheezes or crackles Cardiovascular: Regular rate and rhythm without murmur gallop or rub normal S1 and S2 Abdomen: Nontender, nondistended, soft, bowel sounds positive, no rebound, no ascites, no appreciable mass Extremities: No significant cyanosis, clubbing, or edema bilateral lower extremities  CBC: Recent Labs  Lab 08/26/24 1709 08/27/24 0538  WBC 15.1* 9.7  HGB 13.9 12.5  HCT 39.4 36.0  MCV 88.5 89.6  PLT 400 383   Basic Metabolic Panel: Recent Labs  Lab 08/26/24 1709 08/27/24 0538  NA 131* 134*  K 4.5 4.2  CL 99 103  CO2 18* 20*  GLUCOSE 86 107*  BUN 8 12  CREATININE 0.79 0.92  CALCIUM   9.7 9.7  MG  --  2.2   GFR: Estimated Creatinine Clearance: 63.7 mL/min (by C-G formula based on SCr of 0.92 mg/dL).   Scheduled Meds:  anastrozole   1 mg Oral Daily   aspirin   81 mg Oral Daily   atorvastatin   80 mg Oral Daily   carvedilol   6.25 mg Oral BID WC   clopidogrel   75 mg Oral Daily   hydrALAZINE   50 mg Oral BID    irbesartan  37.5 mg Oral Daily   venlafaxine  XR  75 mg Oral Q breakfast      LOS: 0 days   Reyes IVAR Moores, MD [facility] Office  [phone] Pager - Text Page per Tracey  If 7PM-7AM, please contact night-coverage per Amion 08/27/2024, 8:39 AM

## 2024-08-27 NOTE — ED Notes (Signed)
 Patient transported to CT

## 2024-08-27 NOTE — Discharge Instructions (Signed)
  Primary Care Clinics  [facility] [address] [place], [place] [address] Hours of Operation Monday and Thursday 9AM - 8PM Tuesday and Wednesday 9AM - 5PM Friday, Saturday and Sunday Closed Phone [phone] (English and Spanish) Email [email or web address]  Providing primary, dental, and vision care, mental health services, and medication assistance to those who can't afford it.   [facility] [employer] [address] [place] Monday - Wednesday 8:30 AM - 5 PM Thursday 8:30 AM - 8 PM $5 per visit - Call for an eligibility appointment [phone]  [facility] [address] [place], [address] Monday - Friday 8:30 AM - 5:30 PM Saturdays 9 AM - 1 PM [phone] Serves patients from birth to age 28, providing well visits & sick child care - children who are chronically ill, developmentally delayed or affected by mental health issues.   [facility]) [address] [place], [place] Monday - Friday 9 AM - 6 PM Walk-in Clinic Monday - Thursday 9 AM - 10:30 AM [phone] or [phone] *This clinic sees patients with or without insurance*  [facility] at [facility] [address] [place], [place] [address] [phone]  [facility] [address] Monday - Friday 9 AM - 1 PM 2nd & 4th Saturdays 9 AM - 1 PM Speak with Pam if you are self-pay (no insurance) [phone] or [phone]  Family Medicine at [facility] [address], [facility] Monday - Friday 8 AM - 5 PM On site pharmacy Monday - Friday 8 AM - 4:30 PM (closed for lunch 1-1:30 daily) As a [facility], they serve patients with or without insurance and connect families to necessary resources. Ph: [phone] Fax: [phone]  [facility] [address] [place], [place]  [phone] [address] [place], [place]  [phone] [email or web address] Monday, Tuesday, Thursday, Friday 8 AM - 5 PM Saturday 8 AM - 1 PM Wednesday - closed Medicare, Medicaid, most insurance For the uninsured, payment plans start at $60 Staff speaks Spanish (la personal habla espaol)  [facility] [address], [place]  [phone] [address], [facility]  [phone] Dr. [place]ina, $120 for self-pay (no insurance)   [facility] Phone: [phone] Text: [phone] Email: [email or web address] Offering first-rate mobile medical clinic, with space to comfortably and conveniently offer abdominal and trans-vaginal ultrasounds, pregnancy testing, counseling, and resources.  [facility] [address] [place], [place] [address] or [address], [place], [place] [address] [phone]  [facility] [address] [place], [place] [address] [phone] Providing quality holistic healthcare to the uninsured and underserved in greater [place]  To see if you qualify for government resources, please call: Department of Social Servies - [place], [place] [address], [place] [phone]

## 2024-08-27 NOTE — Progress Notes (Signed)
   08/27/24 1605  TOC Brief Assessment  Insurance and Status Reviewed  Patient has primary care physician No (List added to AVS)  Home environment has been reviewed From home alone  Prior level of function: Independent  Prior/Current Home Services No current home services  Social Drivers of Health Review SDOH reviewed no interventions necessary  Readmission risk has been reviewed Yes  Transition of care needs no transition of care needs at this time   Transition of Care Department [facility]) has reviewed patient and no other TOC needs have been identified at this time. We will continue to monitor patient advancement through interdisciplinary progression rounds. If new patient needs arise, please place a TOC consult.

## 2024-08-28 ENCOUNTER — Other Ambulatory Visit (HOSPITAL_COMMUNITY): Payer: Self-pay

## 2024-08-28 ENCOUNTER — Encounter (HOSPITAL_BASED_OUTPATIENT_CLINIC_OR_DEPARTMENT_OTHER): Admitting: Family

## 2024-08-28 DIAGNOSIS — R079 Chest pain, unspecified: Secondary | ICD-10-CM | POA: Diagnosis not present

## 2024-08-28 DIAGNOSIS — I161 Hypertensive emergency: Secondary | ICD-10-CM | POA: Diagnosis not present

## 2024-08-28 DIAGNOSIS — I701 Atherosclerosis of renal artery: Secondary | ICD-10-CM | POA: Diagnosis not present

## 2024-08-28 LAB — BASIC METABOLIC PANEL WITH GFR
Anion gap: 12 (ref 5–15)
BUN: 12 mg/dL (ref 6–20)
CO2: 20 mmol/L — ABNORMAL LOW (ref 22–32)
Calcium: 9.8 mg/dL (ref 8.9–10.3)
Chloride: 101 mmol/L (ref 98–111)
Creatinine, Ser: 1.09 mg/dL — ABNORMAL HIGH (ref 0.44–1.00)
GFR, Estimated: >60 mL/min (ref >60)
Glucose, Bld: 101 mg/dL — ABNORMAL HIGH (ref 70–99)
Potassium: 4.1 mmol/L (ref 3.5–5.1)
Sodium: 133 mmol/L — ABNORMAL LOW (ref 135–145)

## 2024-08-28 LAB — CBC
HCT: 36.5 % (ref 36.0–46.0)
Hemoglobin: 12.8 g/dL (ref 12.0–15.0)
MCH: 31.1 pg (ref 26.0–34.0)
MCHC: 35.1 g/dL (ref 30.0–36.0)
MCV: 88.6 fL (ref 80.0–100.0)
Platelets: 388 K/uL (ref 150–400)
RBC: 4.12 MIL/uL (ref 3.87–5.11)
RDW: 14.2 % (ref 11.5–15.5)
WBC: 10.9 K/uL — ABNORMAL HIGH (ref 4.0–10.5)
nRBC: 0 % (ref 0.0–0.2)

## 2024-08-28 MED ORDER — CLONIDINE HCL 0.1 MG PO TABS: 0.1000 mg | ORAL_TABLET | Freq: Two times a day (BID) | ORAL | 0 refills | Status: AC

## 2024-08-28 MED ORDER — ISOSORB DINITRATE-HYDRALAZINE 20-37.5 MG PO TABS: 1.0000 | ORAL_TABLET | Freq: Three times a day (TID) | ORAL | 0 refills | Status: AC

## 2024-08-28 NOTE — Discharge Summary (Signed)
 DISCHARGE SUMMARY  Anna Henson  MR#: [id number]  DOB:06/29/1971  Date of Admission: 08/26/2024 Date of Discharge: 08/28/2024  Attending Physician:Elsey Holts ONEIDA Moores, MD  Patient's ERE:[place], No Pcp Per  Disposition: D/C home   Follow-up Appts:  Follow-up Information     Court Dorn PARAS, MD Follow up.   Specialties: Cardiology, Radiology Why: Call the office to arrange for a follow up appointment as we discussed. Contact information: [address] [place] [address] [phone]                 Tests Needing Follow-up: -assess BP control with adjustment in medication regimen during this admission  -assure pt followed up with Dr. Court to schedule intervention/correction of her RAS  Discharge Diagnoses: Chest pain Uncontrolled HTN Severe Renal Artery Stenosis  Breast cancer on anastrozole  HLD Chronic anxiety/depression  Initial presentation: 53 year old with a history of resistant HTN, breast cancer on anastrozole , chronic diastolic CHF, HOCM, NSTEMI/CAD status post PCI with stenting, HLD, and chronic anxiety/depression who presented to the ER 11/18 with the acute onset of chest pain that occurred and she received the news of the unexpected death of her mother. EMS was called to the home and the patient was noted to have a systolic blood pressure over 200 at the time. In the ER initial EKG was without acute ischemic change. High-sensitivity troponin was unimpressive with no acute peak. Blood pressure at presentation was 210/110.   Hospital Course:  Chest pain Acute onset on learning of unexpected death of her mother - troponin trend not consistent with ACS - EKG without evidence of ACS - continue usual DAPT Lipitor  Coreg  and ARB - recurrent bouts in ED seem to be coincident with spikes in uncontrolled HTN - with control of BP chest pain resolved    Uncontrolled HTN Blood pressure was initially quite labile with a range of 126-201 systolic  -patient was due to have a CT angiogram of the abdomen and pelvis as an outpatient to rule out renal artery stenosis so this was accomplished during her hospital stay, and revealed significant progression of renal artery stenosis right greater than left -it is felt this is the etiology of her labile refractory HTN -I discussed these findings with Dr. Dorn Court who knows her and has been monitoring her RAS as an outpatient -he has advised that close outpatient follow-up in his clinic to allow for discussion and planning of definitive intervention is the most appropriate approach - all of her usual home blood pressure medications have been resumed and additional agents have been added -blood pressure at present time is well-controlled but I doubt this will persist until her RAS is addressed - she is advised to call Dr. Ranee clinic to arrange for a visit asap   Leukocytosis No evidence to suggest an acute infection -CXR presentation unrevealing -likely simple stress de-margination -resolved on follow-up   Breast cancer on anastrozole  Continue usual home dose   HLD Continue usual Lipitor  dose   Chronic anxiety/depression Continue usual Effexor  dose  Allergies as of 08/28/2024       Reactions   Porcine (pork) Protein-containing Drug Products Other (See Comments)   Does NOT eat pork        Medication List     STOP taking these medications    hydrALAZINE  50 MG tablet Commonly known as: APRESOLINE    isosorbide  mononitrate 30 MG 24 hr tablet Commonly known as: IMDUR        TAKE these medications    amLODipine   10 MG tablet Commonly known as: NORVASC  Take 1 tablet (10 mg total) by mouth at bedtime.   anastrozole  1 MG tablet Commonly known as: ARIMIDEX  TAKE 1 TABLET(1 MG) BY MOUTH DAILY   aspirin  81 MG chewable tablet Chew 1 tablet (81 mg total) by mouth daily.   atorvastatin  80 MG tablet Commonly known as: LIPITOR  Take 1 tablet (80 mg total) by mouth daily.    baclofen  10 MG tablet Commonly known as: LIORESAL  TAKE 1 TABLET BY MOUTH FOUR TIMES DAILY   carvedilol  6.25 MG tablet Commonly known as: COREG  Take 1 tablet (6.25 mg total) by mouth 2 (two) times daily with a meal.   cloNIDine 0.1 MG tablet Commonly known as: CATAPRES Take 1 tablet (0.1 mg total) by mouth 2 (two) times daily.   clopidogrel  75 MG tablet Commonly known as: PLAVIX  Take 1 tablet (75 mg total) by mouth daily.   fluticasone  50 MCG/ACT nasal spray Commonly known as: FLONASE  Place 1-2 sprays into both nostrils daily as needed for allergies or rhinitis.   furosemide  20 MG tablet Commonly known as: LASIX  Take 1 tablet (20 mg total) by mouth daily. FOR FLUID RETENTION   gabapentin  300 MG capsule Commonly known as: NEURONTIN  Take 3 capsules (900 MG) by mouth three times daily   isosorbide -hydrALAZINE  20-37.5 MG tablet Commonly known as: BIDIL Take 1 tablet by mouth 3 (three) times daily.   methocarbamol  500 MG tablet Commonly known as: ROBAXIN  TAKE 1 TABLET(500 MG) BY MOUTH THREE TIMES DAILY AS NEEDED FOR MUSCLE SPASMS   nitroGLYCERIN  0.4 MG SL tablet Commonly known as: NITROSTAT  Place 1 tablet (0.4 mg total) under the tongue every 5 (five) minutes as needed for chest pain. PLACE 1 TABLET UNDER THE TONGUE EVERY 5 MINUTES AS NEEDED FOR CHEST PAIN   olmesartan 40 MG tablet Commonly known as: BENICAR Take 1 tablet (40 mg total) by mouth daily.   oxcarbazepine  600 MG tablet Commonly known as: TRILEPTAL  TAKE 1 TABLET(600 MG) BY MOUTH THREE TIMES DAILY   Repatha  SureClick 140 MG/ML Soaj Generic drug: Evolocumab  ADMINISTER 1 ML UNDER THE SKIN EVERY 14 DAYS   spironolactone  50 MG tablet Commonly known as: ALDACTONE  Take 1 tablet (50 mg total) by mouth daily. What changed:  how much to take additional instructions   traMADol  50 MG tablet Commonly known as: ULTRAM  TAKE 1 TABLET(50 MG) BY MOUTH UP TO THREE TIMES A DAILY AS NEEDED   venlafaxine  XR 75 MG 24 hr  capsule Commonly known as: EFFEXOR -XR TAKE 1 CAPSULE BY MOUTH DAILY WITH BREAKFAST        Day of Discharge BP 118/74 (BP Location: Left Arm)   Pulse (!) 59   Temp 98 F (36.7 C) (Oral)   Resp 19   Ht 5' 1.5 (1.562 m)   Wt 59.7 kg   LMP 03/06/2020   SpO2 99%   BMI 24.47 kg/m   Physical Exam: General: No acute respiratory distress Lungs: Clear to auscultation bilaterally without wheezes or crackles Cardiovascular: Regular rate and rhythm without murmur gallop or rub normal S1 and S2 Abdomen: Nontender, nondistended, soft, bowel sounds positive, no rebound, no ascites, no appreciable mass Extremities: No significant cyanosis, clubbing, or edema bilateral lower extremities  Basic Metabolic Panel: Recent Labs  Lab 08/26/24 1709 08/27/24 0538 08/28/24 0257  NA 131* 134* 133*  K 4.5 4.2 4.1  CL 99 103 101  CO2 18* 20* 20*  GLUCOSE 86 107* 101*  BUN 8 12 12   CREATININE 0.79 0.92 1.09*  CALCIUM  9.7 9.7 9.8  MG  --  2.2  --     CBC: Recent Labs  Lab 08/26/24 1709 08/27/24 0538 08/28/24 0257  WBC 15.1* 9.7 10.9*  HGB 13.9 12.5 12.8  HCT 39.4 36.0 36.5  MCV 88.5 89.6 88.6  PLT 400 383 388    Time spent in discharge (includes decision making & examination of pt):  08/28/2024, 11:35 AM   Reyes IVAR Moores, MD [facility] Office  [phone]

## 2024-08-28 NOTE — TOC Transition Note (Signed)
 Transition of Care [facility]) - Discharge Note   Patient Details  Name: Anna Henson MRN: [id number] Date of Birth: 06/15/1971  [facility] of [facility]) CM/SW Contact:  Waddell Barnie Rama, RN Phone Number: 08/28/2024, 11:51 AM   Clinical Narrative:    For dc, niece will transport her home, she is at bedside.  She has no needs.         Patient Goals and CMS Choice            Discharge Placement                       Discharge Plan and Services Additional resources added to the After Visit Summary for                                       Social Drivers of Health (SDOH) Interventions SDOH Screenings   Food Insecurity: No Food Insecurity (08/27/2024)  Housing: Low Risk  (08/27/2024)  Transportation Needs: No Transportation Needs (08/27/2024)  Utilities: Not At Risk (08/27/2024)  Alcohol Screen: Low Risk  (08/28/2017)  Financial Resource Strain: Medium Risk (08/16/2017)  Physical Activity: Insufficiently Active (08/16/2017)  Social Connections: Unknown (02/20/2022)   Received from [facility]  Stress: Stress Concern Present (08/16/2017)  Tobacco Use: Medium Risk (08/27/2024)     Readmission Risk Interventions     No data to display

## 2024-08-28 NOTE — Care Management Obs Status (Deleted)
 MEDICARE OBSERVATION STATUS NOTIFICATION   Patient Details  Name: Anna Henson MRN: [id number] Date of Birth: [birth year]   Medicare Observation Status Notification Given:  Yes    Vonzell Arrie Sharps 08/28/2024, 8:45 AM

## 2024-08-28 NOTE — Plan of Care (Signed)

## 2024-08-28 NOTE — TOC CM/SW Note (Signed)
 Transition of Care [facility]) - Inpatient Brief Assessment   Patient Details  Name: Anna Henson MRN: [id number] Date of Birth: 1970/12/21  [facility]) CM/SW Contact:    Waddell Barnie Rama, RN Phone Number: 08/28/2024, 11:49 AM   Clinical Narrative: From home alone, has  no PCP, but has insurance on file, states has no HH services in place at this time , has walker and a w/chair at home.  States family member (niece)  will transport them home at [employer] and family is support system, states gets medications from [facility] and [facility].  Pta self ambulatory with walker.  There are no ICM needs identified  at this time.  Please place consult for ICM needs.     Transition of Care Asessment: Insurance and Status: Insurance coverage has been reviewed Patient has primary care physician: Yes Home environment has been reviewed: home alone Prior level of function:: ambulatory with walker Prior/Current Home Services: Current home services (walker, w/chair) Social Drivers of Health Review: SDOH reviewed no interventions necessary Readmission risk has been reviewed: Yes Transition of care needs: no transition of care needs at this time

## 2024-09-08 ENCOUNTER — Ambulatory Visit: Attending: Cardiovascular Disease | Admitting: Cardiovascular Disease

## 2024-09-08 ENCOUNTER — Encounter: Payer: Self-pay | Admitting: Cardiovascular Disease

## 2024-09-08 VITALS — BP 126/64 | HR 81 | Ht 61.5 in | Wt 138.2 lb

## 2024-09-08 DIAGNOSIS — I10 Essential (primary) hypertension: Secondary | ICD-10-CM | POA: Diagnosis not present

## 2024-09-08 DIAGNOSIS — I739 Peripheral vascular disease, unspecified: Secondary | ICD-10-CM | POA: Diagnosis not present

## 2024-09-08 DIAGNOSIS — I701 Atherosclerosis of renal artery: Secondary | ICD-10-CM

## 2024-09-08 DIAGNOSIS — R0989 Other specified symptoms and signs involving the circulatory and respiratory systems: Secondary | ICD-10-CM

## 2024-09-08 NOTE — Assessment & Plan Note (Signed)
 History of PAD status post left iliac stenting by myself 08/11/2021 with a 9 mm x 60 mm long absolute Pro nitinol self-expanding stent.  She had an initial gradient across her stenosis of 60 mmHg after intra-arterial nitroglycerin .  She also had a 99% mid left SFA stenosis with three-vessel runoff.  Her claudication resolved after that.  Most recent Doppler study performed 02/07/2022 revealed widely patent iliac stent.  She currently denies claudication.  Will repeat aortoiliac Doppler studies.

## 2024-09-08 NOTE — Assessment & Plan Note (Signed)
 History of resistant hypertension blood pressure measured today 126/64.  She is on amlodipine , carvedilol , Catapres  patch, BiDil  and olmesartan .  Her renal Dopplers do suggest left renal artery stenosis which she is had in the past as she has had a CTA that showed moderate left renal artery stenosis in the 50% range on 11/19/2020.  I performed peripheral angiography on her 08/11/2021 revealing a 40% proximal left renal artery stenosis which I did not think was tight enough to be causing renovascular hypertension.

## 2024-09-08 NOTE — Patient Instructions (Addendum)
 Medication Instructions:  Your physician recommends that you continue on your current medications as directed. Please refer to the Current Medication list given to you today.  *If you need a refill on your cardiac medications before your next appointment, please call your pharmacy*   Testing/Procedures: Your physician has requested that you have a carotid duplex. This test is an ultrasound of the carotid arteries in your neck. It looks at blood flow through these arteries that supply the brain with blood. Allow one hour for this exam. There are no restrictions or special instructions. This will take place at [address], 4th floor  Please note: We ask at that you not bring children with you during ultrasound (echo/ vascular) testing. Due to room size and safety concerns, children are not allowed in the ultrasound rooms during exams. Our front office staff cannot provide observation of children in our lobby area while testing is being conducted. An adult accompanying a patient to their appointment will only be allowed in the ultrasound room at the discretion of the ultrasound technician under special circumstances. We apologize for any inconvenience.   Your physician has requested that you have an Aorta/Iliac Duplex. This will take place at [address] [facility], 4th floor  No food after 11PM the night before.  Water  is OK. (Don't drink liquids if you have been instructed not to for ANOTHER test) Avoid foods that produce bowel gas, for 24 hours prior to exam (see below). No breakfast, no chewing gum, no smoking or carbonated beverages. Patient may take morning medications with water . Come in for test at least 15 minutes early to register.  Please note: We ask at that you not bring children with you during ultrasound (echo/ vascular) testing. Due to room size and safety concerns, children are not allowed in the ultrasound rooms during exams. Our front office staff cannot provide observation of  children in our lobby area while testing is being conducted. An adult accompanying a patient to their appointment will only be allowed in the ultrasound room at the discretion of the ultrasound technician under special circumstances. We apologize for any inconvenience.  Your physician has requested that you have an ankle brachial index (ABI). During this test an ultrasound and blood pressure cuff are used to evaluate the arteries that supply the arms and legs with blood. Allow thirty minutes for this exam. There are no restrictions or special instructions. This will take place at [address] 4th floor   Please note: We ask at that you not bring children with you during ultrasound (echo/ vascular) testing. Due to room size and safety concerns, children are not allowed in the ultrasound rooms during exams. Our front office staff cannot provide observation of children in our lobby area while testing is being conducted. An adult accompanying a patient to their appointment will only be allowed in the ultrasound room at the discretion of the ultrasound technician under special circumstances. We apologize for any inconvenience.   Your physician has requested that you have a renal artery duplex. During this test, an ultrasound is used to evaluate blood flow to the kidneys. Take your medications as you usually do. This will take place at [address] 4th floor.  No food after 11PM the night before.  Water  is OK. (Don't drink liquids if you have been instructed not to for ANOTHER test). Avoid foods that produce bowel gas, for 24 hours prior to exam (see below). No breakfast, no chewing gum, no smoking or carbonated  beverages. Patient may take morning medications with water . Come in for test at least 15 minutes early to register. **To do in November 2026**  Please note: We ask at that you not bring children with you during ultrasound (echo/ vascular) testing. Due to room size and safety concerns,  children are not allowed in the ultrasound rooms during exams. Our front office staff cannot provide observation of children in our lobby area while testing is being conducted. An adult accompanying a patient to their appointment will only be allowed in the ultrasound room at the discretion of the ultrasound technician under special circumstances. We apologize for any inconvenience.   Follow-Up: At [facility], you and your health needs are our priority.  As part of our continuing mission to provide you with exceptional heart care, our providers are all part of one team.  This team includes your primary Cardiologist (physician) and Advanced Practice Providers or APPs (Physician Assistants and Nurse Practitioners) who all work together to provide you with the care you need, when you need it.  Your next appointment:   12 month(s)  Provider:   Dorn Lesches, MD

## 2024-09-08 NOTE — Progress Notes (Signed)
 09/08/2024 Eva Aquas [facility]   Feb 21, 1971  [id number]  Primary Physician Patient, No Pcp Per Primary Cardiologist: Dorn JINNY Lesches MD [facility], [place], [place]  HPI:  Anna Henson is a 53 y.o.   mild to moderately overweight single African-American female with no children referred to me by Dr. Mona for evaluation of potential renal vascular hypertension.  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  I last saw her in the office 08/15/2022.SABRA  Her cardiac risk factors are notable for discontinue tobacco abuse having smoked approximately 12 pack years and discontinued in 2015.  She has treated hypertension and hyperlipidemia.  Her mother had myocardial infarction and stents.  She has had a heart attack in the past and has had RCA stenting 09/14/2014 and again 12/31/2016.  She has had hypertension for many years and has had several admissions for hypertensive urgency.  She had renal Doppler studies performed in our office 10/15/2020 that suggested mild to moderate left renal artery stenosis with a renal aortic ratio of 3.58 and subsequent CTA performed 11/19/2020 that showed moderate left renal artery stenosis in the 50% range which was confirmatory.  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  I performed peripheral angiography on her 08/11/2021 revealing a high-grade distal left common iliac artery stenosis which I stented.  She had a 90% focal mid left SFA which I did not intervene on with three-vessel runoff.  There was no obstructive disease on the right.  She did have a 40% proximal left renal artery stenosis.  Her claudication is significantly improved since her procedure.   Since I saw her in the office 2 years ago she continues to do well.  She had a heart cath performed by Dr. Elmira 02/11/2024 that showed patent stents in her RCA.  Her blood pressure seems to be better controlled on her current medical regimen.  She did  have renal Doppler studies performed 08/21/2024 that showed a left renal aortic ratio of 4.2 similar to prior Doppler studies which has correlated with a 40% left renal artery stenosis by angiography.  She currently denies claudication.   Current Meds  Medication Sig   amLODipine  (NORVASC ) 10 MG tablet Take 1 tablet (10 mg total) by mouth at bedtime.   anastrozole  (ARIMIDEX ) 1 MG tablet TAKE 1 TABLET(1 MG) BY MOUTH DAILY   aspirin  81 MG chewable tablet Chew 1 tablet (81 mg total) by mouth daily.   atorvastatin  (LIPITOR ) 80 MG tablet Take 1 tablet (80 mg total) by mouth daily.   baclofen  (LIORESAL ) 10 MG tablet TAKE 1 TABLET BY MOUTH FOUR TIMES DAILY   carvedilol  (COREG ) 6.25 MG tablet Take 1 tablet (6.25 mg total) by mouth 2 (two) times daily with a meal.   cloNIDine  (CATAPRES ) 0.1 MG tablet Take 1 tablet (0.1 mg total) by mouth 2 (two) times daily.   clopidogrel  (PLAVIX ) 75 MG tablet Take 1 tablet (75 mg total) by mouth daily.   Evolocumab  (REPATHA  SURECLICK) 140 MG/ML SOAJ ADMINISTER 1 ML UNDER THE SKIN EVERY 14 DAYS   fluticasone  (FLONASE ) 50 MCG/ACT nasal spray Place 1-2 sprays into both nostrils daily as needed for allergies or rhinitis.   furosemide  (LASIX ) 20 MG tablet Take 1 tablet (20 mg total) by mouth daily. FOR FLUID RETENTION   gabapentin  (NEURONTIN ) 300 MG capsule Take 3 capsules (900 MG) by mouth three times daily   isosorbide -hydrALAZINE  (BIDIL ) 20-37.5 MG tablet Take 1  tablet by mouth 3 (three) times daily.   methocarbamol  (ROBAXIN ) 500 MG tablet TAKE 1 TABLET(500 MG) BY MOUTH THREE TIMES DAILY AS NEEDED FOR MUSCLE SPASMS   nitroGLYCERIN  (NITROSTAT ) 0.4 MG SL tablet Place 1 tablet (0.4 mg total) under the tongue every 5 (five) minutes as needed for chest pain. PLACE 1 TABLET UNDER THE TONGUE EVERY 5 MINUTES AS NEEDED FOR CHEST PAIN   olmesartan  (BENICAR ) 40 MG tablet Take 1 tablet (40 mg total) by mouth daily.   oxcarbazepine  (TRILEPTAL ) 600 MG tablet TAKE 1 TABLET(600 MG) BY  MOUTH THREE TIMES DAILY   spironolactone  (ALDACTONE ) 50 MG tablet Take 1 tablet (50 mg total) by mouth daily. (Patient taking differently: Take 25-50 mg by mouth daily. Take 1 tablet by mouth every morning, then take 0.5 tablet by mouth every evening.)   traMADol  (ULTRAM ) 50 MG tablet TAKE 1 TABLET(50 MG) BY MOUTH UP TO THREE TIMES A DAILY AS NEEDED   venlafaxine  XR (EFFEXOR -XR) 75 MG 24 hr capsule TAKE 1 CAPSULE BY MOUTH DAILY WITH BREAKFAST     Allergies  Allergen Reactions   Porcine (Pork) Protein-Containing Drug Products Other (See Comments)    Does NOT eat pork    Social History   Socioeconomic History   Marital status: Single    Spouse name: Not on file   Number of children: 0   Years of education: masters   Highest education level: Not on file  Occupational History   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   Drug use: No   Sexual activity: Not Currently  Other Topics Concern   Not on file  Social History Narrative   Consumes 2 cups of caffeine daily   Social Drivers of Health   Financial Resource Strain: Medium Risk (08/16/2017)   Overall Financial Resource Strain (CARDIA)    Difficulty of Paying Living Expenses: Somewhat hard  Food Insecurity: No Food Insecurity (08/27/2024)   Hunger Vital Sign    Worried About Running Out of Food in the Last Year: Never true    Ran Out of Food in the Last Year: Never true  Transportation Needs: No Transportation Needs (08/27/2024)   PRAPARE -  Administrator, Civil Service (Medical): No    Lack of Transportation (Non-Medical): No  Physical Activity: Insufficiently Active (08/16/2017)   Exercise Vital Sign    Days of Exercise per Week: 1 day    Minutes of Exercise per Session: 10 min  Stress: Stress Concern Present (08/16/2017)   [employer] of Occupational Health - Occupational Stress Questionnaire    Feeling of Stress : Very much  Social Connections: Unknown (02/20/2022)   Received from [facility]   Social Network    Social Network: Not on file  Intimate Partner Violence: Not At Risk (08/27/2024)   Humiliation, Afraid, Rape, and Kick questionnaire    Fear of Current or Ex-Partner: No  Emotionally Abused: No    Physically Abused: No    Sexually Abused: No     Review of Systems: General: negative for chills, fever, night sweats or weight changes.  Cardiovascular: negative for chest pain, dyspnea on exertion, edema, orthopnea, palpitations, paroxysmal nocturnal dyspnea or shortness of breath Dermatological: negative for rash Respiratory: negative for cough or wheezing Urologic: negative for hematuria Abdominal: negative for nausea, vomiting, diarrhea, bright red blood per rectum, melena, or hematemesis Neurologic: negative for visual changes, syncope, or dizziness All other systems reviewed and are otherwise negative except as noted above.    Blood pressure 126/64, pulse 81, height 5' 1.5 (1.562 m), weight 138 lb 3.2 oz (62.7 kg), last menstrual period 03/06/2020, SpO2 98%.  General appearance: alert and no distress Neck: no adenopathy, no JVD, supple, symmetrical, trachea midline, thyroid  not enlarged, symmetric, no tenderness/mass/nodules, and soft left carotid bruit Lungs: clear to auscultation bilaterally Heart: regular rate and rhythm, S1, S2 normal, no murmur, click, rub or gallop Extremities: extremities normal, atraumatic, no cyanosis or edema Pulses: 2+ and symmetric Skin: Skin color,  texture, turgor normal. No rashes or lesions Neurologic: Grossly normal  EKG not performed today      ASSESSMENT AND PLAN:   Essential hypertension History of resistant hypertension blood pressure measured today 126/64.  She is on amlodipine , carvedilol , Catapres  patch, BiDil  and olmesartan .  Her renal Dopplers do suggest left renal artery stenosis which she is had in the past as she has had a CTA that showed moderate left renal artery stenosis in the 50% range on 11/19/2020.  I performed peripheral angiography on her 08/11/2021 revealing a 40% proximal left renal artery stenosis which I did not think was tight enough to be causing renovascular hypertension.  Peripheral arterial disease History of PAD status post left iliac stenting by myself 08/11/2021 with a 9 mm x 60 mm long absolute Pro nitinol self-expanding stent.  She had an initial gradient across her stenosis of 60 mmHg after intra-arterial nitroglycerin .  She also had a 99% mid left SFA stenosis with three-vessel runoff.  Her claudication resolved after that.  Most recent Doppler study performed 02/07/2022 revealed widely patent iliac stent.  She currently denies claudication.  Will repeat aortoiliac Doppler studies.     Dorn DOROTHA Lesches MD FACP,FACC,FAHA, [facility] 09/08/2024 12:09 PM

## 2024-09-22 ENCOUNTER — Ambulatory Visit: Admitting: Medical

## 2024-09-23 ENCOUNTER — Other Ambulatory Visit (HOSPITAL_BASED_OUTPATIENT_CLINIC_OR_DEPARTMENT_OTHER): Payer: Self-pay | Admitting: Family

## 2024-09-23 DIAGNOSIS — I1A0 Resistant hypertension: Secondary | ICD-10-CM

## 2024-10-08 ENCOUNTER — Other Ambulatory Visit: Payer: Self-pay | Admitting: Internal Medicine

## 2024-10-08 DIAGNOSIS — E785 Hyperlipidemia, unspecified: Secondary | ICD-10-CM

## 2024-10-14 ENCOUNTER — Encounter (HOSPITAL_BASED_OUTPATIENT_CLINIC_OR_DEPARTMENT_OTHER): Admitting: Cardiovascular Disease

## 2024-10-14 NOTE — Progress Notes (Deleted)
 "  Advanced Hypertension Clinic Initial Assessment:    Date:  10/14/2024   ID:  Anna Henson, DOB 05-20-1971, MRN [id number]  PCP:  Patient, No Pcp Per  Cardiologist:  Vinie JAYSON Maxcy, MD  Nephrologist:  Referring MD: No ref. provider found   CC: Hypertension  History of Present Illness:    Anna Henson is a 54 y.o. female with a hx of resistant hypertension, CAD, HOCM, CKD 3a, prolonged QT, prior tobacco abuse, and hyperlipidemia here for follow-up.  She was first seen in advanced hypertension clinic 07/2024.  She was admitted 02/2024 with NSTEMI in the setting of hypertensive emergency.  Left heart cath revealed obstructive disease in the left circumflex which was not amenable to revascularization.  She was started on DAPT and medically managed.  She was admitted 04/2024 again with chest pain in setting of hypertensive urgency.  Carvedilol  was increased.  She had a another admission for hypertensive urgency 05/2024 and hydralazine  was increased.  At her visit 07/2024 she noted very labile blood pressure ranging from the 130s to the 200s systolic.  Blood pressures were significantly influenced by heat and stress.  She struggled with anxiety.  At the time she was taking carvedilol , lisinopril , amlodipine , and hydralazine .  She noted that blood pressures tended to be worse in the mornings and that she has chest pain when her blood pressure is elevated.  At her initial visit lisinopril  was switched to losartan.  Carvedilol  was increased.  Renin and aldosterone were ordered but never collected.  Renal Dopplers 08/2024 revealed greater than 60% stenosis in the right renal artery and left renal artery.  CTA of the abdomen confirmed severe focal stenosis of the proximal right renal and moderate to severe in the origin of the left renal artery which has progressed compared to prior studies.  She had 50 to 60% stenosis at the origin of the celiac artery that was unchanged.  She saw Dr. Court  09/2024 and blood pressure was 126/64.  He noted that when he did angiography in 2022 there was 40% stenosis of the left renal artery which he did not think was tight enough to be causing her renovascular hypertension.  He did not recommend intervention at this time.  Discussed the use of AI scribe software for clinical note transcription with the patient, who gave verbal consent to proceed.  History of Present Illness      Previous antihypertensives:    Past Medical History:  Diagnosis Date   Anemia    Anxiety    Bipolar disorder in full remission    CAD in native artery cardiologist--  dr hilty   a. 09/2014 Cath/PCI in setting of [place]  - s/p 2.25 x 12 mm Promus Premier DES to mid RCA;  b. 11/2016 Myoview : EF 56%, no ischemia;  c. 12/2016 NSTEMI/PCI: LM nl, LAd 25p, LCX 30p, RCA 100p (2.75x38 Promus Premier DES), mRCA 10 ISR, EF 50-55%.   CKD (chronic kidney disease), stage III (HCC)    Depression    Genetic testing 04/23/2017   Ms. Large underwent genetic counseling and testing for hereditary cancer syndromes on 04/05/2017. Her results were negative for mutations in all 46 genes analyzed by [facility]'s 46-gene Common Hereditary Cancers Panel. Genes analyzed include: APC, ATM, AXIN2, BARD1, BMPR1A, BRCA1, BRCA2, BRIP1, CDH1, CDKN2A, CHEK2, CTNNA1, DICER1, EPCAM, GREM1, HOXB13, KIT, MEN1, MLH1, MSH2, MSH3, MSH6, MUTYH, NBN,   GERD (gastroesophageal reflux disease)    Headache    History of external beam radiation therapy  right breast 07-27-2017  to 09-25-2017   History of non-ST elevation myocardial infarction (NSTEMI)    HOCM (hypertrophic obstructive cardiomyopathy) (HCC) followed by cardiology   a. 12/2016 Echo: EF 65-70%,  mod conc LVH, dynamic obstruction @ rest, peak velocity of 291 cm/sec w/ peak gradient of , no rwma, Gr1 DD, triv TR, PASP .   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[facility]) neurologist--- dr vear   01/ 2017  dx transverse myelitis w/ right side numbness (09-01-2019  currently lower extremity weakness, mucsle spasms, and gait disturbance)   Wears glasses     Past Surgical History:  Procedure Laterality Date   ABDOMINAL AORTOGRAM W/LOWER EXTREMITY N/A 08/11/2021   Procedure: ABDOMINAL AORTOGRAM W/LOWER EXTREMITY;  Surgeon: Court Dorn PARAS, MD;  Location: [facility] INVASIVE CV LAB;  Service: Cardiovascular;  Laterality: N/A;   BREAST LUMPECTOMY WITH RADIOACTIVE SEED AND SENTINEL LYMPH NODE BIOPSY Right 06/21/2017   Procedure: RIGHT BREAST BRACKETED SEED GUIDED LUMPECTOMY AND SENTINEL LYMPH NODE BIOPSY;  Surgeon: Curvin Deward MOULD, MD;  Location: [facility] OR;  Service: General;  Laterality: Right;   CORONARY/GRAFT ACUTE MI REVASCULARIZATION N/A 12/31/2016   Procedure: Coronary/Graft Acute MI Revascularization;  Surgeon: Ozell Fell, MD;  Location: [facility] INVASIVE CV LAB;  Service: Cardiovascular;  Laterality: N/A;   DILATATION & CURETTAGE/HYSTEROSCOPY WITH MYOSURE N/A 09/02/2019   Procedure: DILATATION & CURETTAGE/HYSTEROSCOPY WITH MYOSURE;  Surgeon: Kandyce Sor, MD;  Location: [facility] [place];  Service: Gynecology;  Laterality: N/A;   HERNIA REPAIR     LEFT HEART CATH AND CORONARY ANGIOGRAPHY N/A 12/31/2016   Procedure: Left Heart Cath and Coronary Angiography;  Surgeon: Ozell Fell, MD;  Location: [facility] INVASIVE CV LAB;  Service: Cardiovascular;  Laterality: N/A;   LEFT HEART CATH AND CORONARY ANGIOGRAPHY N/A 02/11/2024   Procedure: LEFT HEART CATH  AND CORONARY ANGIOGRAPHY;  Surgeon: Elmira Newman PARAS, MD;  Location: [facility] INVASIVE CV LAB;  Service: Cardiovascular;  Laterality: N/A;   LEFT HEART CATHETERIZATION WITH CORONARY ANGIOGRAM N/A 09/14/2014   Procedure: LEFT HEART CATHETERIZATION WITH CORONARY ANGIOGRAM;  Surgeon: Lonni JONETTA Cash, MD;  Location: [facility] CATH LAB;  Service: Cardiovascular;  Laterality: N/A;   MASS EXCISION N/A 03/30/2020   Procedure: EXCISION MASS RIGHT LABIA MINORA;  Surgeon: Viktoria Comer SAUNDERS, MD;  Location: [facility] ORS;  Service: Gynecology;  Laterality: N/A;   PERCUTANEOUS CORONARY STENT INTERVENTION (PCI-S)  09/14/2014   Procedure: PERCUTANEOUS CORONARY STENT INTERVENTION (PCI-S);  Surgeon: Lonni JONETTA Cash, MD;  Location: [facility] CATH LAB;  Service: Cardiovascular;;   PERIPHERAL VASCULAR INTERVENTION  08/11/2021   Procedure: PERIPHERAL VASCULAR INTERVENTION;  Surgeon: Court Dorn PARAS, MD;  Location: [facility] INVASIVE CV LAB;  Service: Cardiovascular;;   ROBOTIC ASSISTED SALPINGO OOPHERECTOMY Bilateral 03/30/2020   Procedure: XI ROBOTIC ASSISTED SALPINGO OOPHORECTOMY;  Surgeon: Viktoria Comer SAUNDERS, MD;  Location: [facility] ORS;  Service: Gynecology;  Laterality: Bilateral;   TONSILLECTOMY  2005   UMBILICAL HERNIA REPAIR  2001; 2005    Current Medications: Active Medications[1]  Allergies:   Porcine (pork) protein-containing drug products   Social History   Socioeconomic History   Marital status: Single    Spouse name: Not on file   Number of children: 0   Years of education: masters   Highest education level: Not on file  Occupational History   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   Drug use: No   Sexual activity: Not Currently  Other Topics Concern   Not on file  Social History Narrative   Consumes 2 cups of caffeine daily   Social Drivers of Health   Tobacco Use: Medium Risk (09/08/2024)   Patient History    Smoking Tobacco Use: Former    Smokeless Tobacco Use: Never    Passive Exposure: Not on Actuary Strain: Not on file  Food Insecurity: No Food Insecurity (08/27/2024)   Epic    Worried About Programme Researcher, Broadcasting/film/video in the Last Year: Never true    Ran Out of Food in the Last Year: Never true  Transportation Needs: No Transportation Needs (08/27/2024)   Epic    Lack of Transportation (Medical): No    Lack of Transportation (Non-Medical): No  Physical Activity: Not on file  Stress: Not on file  Social Connections: Unknown (02/20/2022)   [facility]   Social Network    Social Network: Not on file  Depression (PHQ2-9): Not on file  Alcohol Screen: Not on file  Housing: Low Risk (08/27/2024)   Epic    Unable to Pay for Housing in the Last Year: No    Number of Times Moved in the Last Year: 0    Homeless in the Last Year: No  Utilities: Not At Risk (08/27/2024)   Epic    Threatened with loss of utilities: No  Health Literacy: Not on file     Family History: The patient's ***family history includes Bone cancer (age of onset: 82) in her father; Brain cancer (age of onset: 19) in her father; Breast cancer in her mother; Diabetes in her mother; Drug abuse in her father and sister; Heart disease in her mother; Hyperlipidemia in her mother; Hypertension in her mother; Lung cancer in her father; Mental illness in her brother and maternal grandmother; Stomach cancer (age of onset: 16) in her paternal grandmother.  ROS:   Please see the history of present illness.     *** All other systems reviewed and are negative.  EKGs/Labs/Other Studies Reviewed:    EKG:  EKG is *** ordered today.  The ekg ordered today demonstrates ***  Echo 08/27/24:   1. Left ventricular ejection fraction, by estimation, is >75%. The left  ventricle has hyperdynamic function. The left ventricle has no regional  wall motion abnormalities. There is moderate concentric left ventricular  hypertrophy. Left ventricular  diastolic parameters are consistent with Grade I diastolic dysfunction  (impaired relaxation).  2. Right ventricular systolic function is normal. The right ventricular  size is normal.   3. Mild SAm with no significant LVOT gradient at rest. The mitral valve  is normal in structure. No evidence of mitral valve regurgitation. No  evidence of mitral stenosis.   4. The aortic valve is tricuspid. There is mild calcification of the  aortic valve. Aortic valve regurgitation is not visualized. No aortic  stenosis is present.   5. The inferior vena cava is normal in size with greater than 50%  respiratory variability, suggesting right atrial pressure of 3 mmHg.   Renal Doppler 08/2024:   Summary:  Renal:    Right: Evidence of a > 60% stenosis of the right renal artery.         Normal size right kidney. Abnormal right Resistive Index.         Normal cortical thickness of right kidney. RRV flow present.  Left:  Normal size of left kidney. Abnormal left Resisitve Index.         Normal cortical thickness of the left kidney. Evidence of a >        60% stenosis in the left renal artery. LRV flow present.      Recent Labs: 02/10/2024: B Natriuretic Peptide 180.8 05/19/2024: TSH 2.218 05/20/2024: ALT 34 08/27/2024: Magnesium  2.2 08/28/2024: BUN 12; Creatinine, Ser 1.09; Hemoglobin 12.8; Platelets 388; Potassium 4.1; Sodium 133   Recent Lipid Panel    Component Value Date/Time   CHOL 216 (H) 08/01/2024 0446   CHOL 285 (H) 10/29/2023 0837   TRIG 142 08/01/2024  0446   HDL 52 08/01/2024 0446   HDL 43 10/29/2023 0837   CHOLHDL 4.2 08/01/2024 0446   VLDL 28 08/01/2024 0446   LDLCALC 136 (H) 08/01/2024 0446   LDLCALC 220 (H) 10/29/2023 0837    Physical Exam:   VS:  LMP 03/06/2020  , BMI There is no height or weight on file to calculate BMI. GENERAL:  Well appearing HEENT: Pupils equal round and reactive, fundi not visualized, oral mucosa unremarkable NECK:  No jugular venous distention, waveform within normal limits, carotid upstroke brisk and symmetric, no bruits, no thyromegaly LYMPHATICS:  No cervical adenopathy LUNGS:  Clear to auscultation bilaterally HEART:  RRR.  PMI not displaced or sustained,S1 and S2 within normal limits, no S3, no S4, no clicks, no rubs, *** murmurs ABD:  Flat, positive bowel sounds normal in frequency in pitch, no bruits, no rebound, no guarding, no midline pulsatile mass, no hepatomegaly, no splenomegaly EXT:  2 plus pulses throughout, no edema, no cyanosis no clubbing SKIN:  No rashes no nodules NEURO:  Cranial nerves II through XII grossly intact, motor grossly intact throughout PSYCH:  Cognitively intact, oriented to person place and time   ASSESSMENT/PLAN:    Assessment & Plan       Screening for Secondary Hypertension: { Click here to document screening for secondary causes of HTN  :[id number]}    Relevant Labs/Studies:    Latest Ref Rng & Units 08/28/2024    2:57 AM 08/27/2024    5:38 AM 08/26/2024    5:09 PM  Basic Labs  Sodium 135 - 145 mmol/L 133  134  131   Potassium 3.5 - 5.1 mmol/L 4.1  4.2  4.5   Creatinine 0.44 - 1.00 mg/dL 8.90  9.07  9.20        Latest Ref Rng & Units 05/19/2024   11:03 PM 10/15/2020    4:19 AM  Thyroid   TSH 0.350 - 4.500 uIU/mL 2.218  0.573        Latest Ref Rng & Units 10/14/2020    6:12 PM  Renin/Aldosterone   Aldosterone 0.0 - 30.0 ng/dL 7.6              87/05/7973   12:07 PM  Renovascular   Renal Artery US  Completed Yes        Disposition:     FU with MD/PharmD in {gen number [id number]} {Days to years:10300}    Medication Adjustments/Labs and Tests Ordered: Current medicines are reviewed at length with the patient today.  Concerns regarding medicines are outlined above.  No orders of the defined types were placed in this encounter.  No orders of the defined types were placed in this encounter.    Signed, Annabella Scarce, MD  10/14/2024 8:04 AM    [facility]     [1]  No outpatient medications have been marked as taking for the 10/14/24 encounter (Appointment) with Scarce Annabella, MD.   "

## 2024-10-23 ENCOUNTER — Ambulatory Visit (HOSPITAL_COMMUNITY)
Admission: RE | Admit: 2024-10-23 | Discharge: 2024-10-23 | Disposition: A | Discharge status: Home / Self Care | Payer: Self-pay | Source: Ambulatory Visit | Attending: Cardiovascular Disease | Admitting: Cardiovascular Disease

## 2024-10-23 ENCOUNTER — Ambulatory Visit (HOSPITAL_BASED_OUTPATIENT_CLINIC_OR_DEPARTMENT_OTHER)
Admission: RE | Admit: 2024-10-23 | Discharge: 2024-10-23 | Disposition: A | Discharge status: Home / Self Care | Payer: Self-pay | Source: Ambulatory Visit | Attending: Cardiovascular Disease | Admitting: Cardiovascular Disease

## 2024-10-23 ENCOUNTER — Ambulatory Visit: Payer: Self-pay | Admitting: Cardiovascular Disease

## 2024-10-23 DIAGNOSIS — I739 Peripheral vascular disease, unspecified: Secondary | ICD-10-CM | POA: Diagnosis present

## 2024-10-23 DIAGNOSIS — R0989 Other specified symptoms and signs involving the circulatory and respiratory systems: Secondary | ICD-10-CM | POA: Insufficient documentation

## 2024-10-23 DIAGNOSIS — Z95820 Peripheral vascular angioplasty status with implants and grafts: Secondary | ICD-10-CM | POA: Insufficient documentation

## 2024-10-23 LAB — VAS US ABI WITH/WO TBI
Left ABI: 0.84
Right ABI: 0.89

## 2024-11-03 ENCOUNTER — Other Ambulatory Visit: Payer: Self-pay | Admitting: Neurology

## 2024-11-04 NOTE — Telephone Encounter (Signed)
 Last seen on 07/10/24 No follow up scheduled   Did you want patient to continue? She is taking  Continue oxcarbazepine  1800/day, gabapentin  2700 mg/day, baclofen  and tramadol . Can increase tramadol  to up to 3/day    Rx pending to be signed

## 2024-11-06 ENCOUNTER — Encounter (HOSPITAL_BASED_OUTPATIENT_CLINIC_OR_DEPARTMENT_OTHER): Admitting: Family

## 2025-01-14 ENCOUNTER — Ambulatory Visit: Admitting: Hematology and Oncology

## 2025-08-10 ENCOUNTER — Ambulatory Visit (HOSPITAL_COMMUNITY)
